# Patient Record
Sex: Male | Born: 1960 | Race: Black or African American | Hispanic: No | Marital: Married | Smoking: Never smoker
Health system: Southern US, Community
[De-identification: ages and names within clinical notes are randomized; demographics above are authoritative.]

## PROBLEM LIST (undated history)

## (undated) DIAGNOSIS — L0291 Cutaneous abscess, unspecified: Secondary | ICD-10-CM

## (undated) DIAGNOSIS — I050 Rheumatic mitral stenosis: Secondary | ICD-10-CM

## (undated) DIAGNOSIS — M1990 Unspecified osteoarthritis, unspecified site: Secondary | ICD-10-CM

## (undated) DIAGNOSIS — Z992 Dependence on renal dialysis: Secondary | ICD-10-CM

## (undated) DIAGNOSIS — E039 Hypothyroidism, unspecified: Secondary | ICD-10-CM

## (undated) DIAGNOSIS — I272 Pulmonary hypertension, unspecified: Secondary | ICD-10-CM

## (undated) DIAGNOSIS — I428 Other cardiomyopathies: Secondary | ICD-10-CM

## (undated) DIAGNOSIS — J189 Pneumonia, unspecified organism: Secondary | ICD-10-CM

## (undated) DIAGNOSIS — IMO0002 Reserved for concepts with insufficient information to code with codable children: Secondary | ICD-10-CM

## (undated) DIAGNOSIS — C73 Malignant neoplasm of thyroid gland: Secondary | ICD-10-CM

## (undated) DIAGNOSIS — N289 Disorder of kidney and ureter, unspecified: Secondary | ICD-10-CM

## (undated) DIAGNOSIS — Z95810 Presence of automatic (implantable) cardiac defibrillator: Secondary | ICD-10-CM

## (undated) DIAGNOSIS — Z9289 Personal history of other medical treatment: Secondary | ICD-10-CM

## (undated) DIAGNOSIS — I4891 Unspecified atrial fibrillation: Secondary | ICD-10-CM

## (undated) DIAGNOSIS — D649 Anemia, unspecified: Secondary | ICD-10-CM

## (undated) DIAGNOSIS — T8189XA Other complications of procedures, not elsewhere classified, initial encounter: Secondary | ICD-10-CM

## (undated) DIAGNOSIS — N186 End stage renal disease: Secondary | ICD-10-CM

## (undated) DIAGNOSIS — I350 Nonrheumatic aortic (valve) stenosis: Secondary | ICD-10-CM

## (undated) HISTORY — DX: Hypothyroidism, unspecified: E03.9

## (undated) HISTORY — DX: Unspecified atrial fibrillation: I48.91

## (undated) HISTORY — PX: UMBILICAL HERNIA REPAIR: SHX196

## (undated) HISTORY — DX: Malignant neoplasm of thyroid gland: C73

## (undated) HISTORY — DX: Other cardiomyopathies: I42.8

## (undated) HISTORY — PX: OTHER SURGICAL HISTORY: SHX169

## (undated) HISTORY — DX: Reserved for concepts with insufficient information to code with codable children: IMO0002

## (undated) HISTORY — PX: HERNIA REPAIR: SHX51

## (undated) HISTORY — DX: Pulmonary hypertension, unspecified: I27.20

## (undated) HISTORY — PX: TOE AMPUTATION: SHX809

## (undated) HISTORY — PX: KNEE SURGERY: SHX244

---

## 2001-05-12 HISTORY — PX: DIALYSIS FISTULA CREATION: SHX611

## 2004-05-12 HISTORY — PX: LAPAROSCOPIC GASTRIC BANDING: SHX1100

## 2007-05-13 DIAGNOSIS — Z9289 Personal history of other medical treatment: Secondary | ICD-10-CM

## 2007-05-13 HISTORY — DX: Personal history of other medical treatment: Z92.89

## 2007-05-13 HISTORY — PX: PROSTATE BIOPSY: SHX241

## 2007-05-13 HISTORY — PX: KIDNEY TRANSPLANT: SHX239

## 2007-05-13 HISTORY — PX: CARDIAC DEFIBRILLATOR PLACEMENT: SHX171

## 2009-05-12 DIAGNOSIS — C73 Malignant neoplasm of thyroid gland: Secondary | ICD-10-CM

## 2009-05-12 HISTORY — PX: THYROIDECTOMY: SHX17

## 2009-05-12 HISTORY — DX: Malignant neoplasm of thyroid gland: C73

## 2014-04-17 LAB — PROTIME-INR: INR: 1.9 — AB (ref 0.9–1.1)

## 2014-04-24 LAB — PROTIME-INR: INR: 2.8 — AB (ref 0.9–1.1)

## 2014-05-01 LAB — PROTIME-INR: INR: 2 — AB (ref 0.9–1.1)

## 2014-05-22 LAB — PROTIME-INR: INR: 1.5 — AB (ref 0.9–1.1)

## 2014-05-23 ENCOUNTER — Ambulatory Visit (INDEPENDENT_AMBULATORY_CARE_PROVIDER_SITE_OTHER): Payer: Medicare Other | Admitting: Cardiology

## 2014-05-23 ENCOUNTER — Encounter: Payer: Self-pay | Admitting: Cardiology

## 2014-05-23 ENCOUNTER — Telehealth: Payer: Self-pay | Admitting: Cardiology

## 2014-05-23 VITALS — BP 82/60 | HR 65 | Ht 73.0 in | Wt 271.0 lb

## 2014-05-23 DIAGNOSIS — I428 Other cardiomyopathies: Secondary | ICD-10-CM

## 2014-05-23 DIAGNOSIS — I429 Cardiomyopathy, unspecified: Secondary | ICD-10-CM

## 2014-05-23 DIAGNOSIS — Z95810 Presence of automatic (implantable) cardiac defibrillator: Secondary | ICD-10-CM

## 2014-05-23 DIAGNOSIS — I480 Paroxysmal atrial fibrillation: Secondary | ICD-10-CM

## 2014-05-23 NOTE — Telephone Encounter (Signed)
Received signed Release from patient to obtain his cardiology records from [facility].  Faxed on 05/23/14. Records requested per  Dr Percival Spanish.

## 2014-05-23 NOTE — Patient Instructions (Signed)
Your physician has recommended making the following medication changes: DECREASE Amiodarone to 200 mg (1 tablet) once daily.  Your INR today was 1.5. Dr Percival Spanish recommends that you take 1.5 tablets (9 mg total) for the next THREE days and then resume 1 tablet daily.  Please schedule and appointment in 2 weeks with Nehemiah Massed, PharmD.  Dr Percival Spanish recommends scheduling an appointment in the ICD clinic. Dr Percival Spanish wants you to follow-up in 2 months. You will receive a reminder letter in the mail one months in advance. If you don't receive a letter, please call our office to schedule the follow-up appointment.

## 2014-05-23 NOTE — Progress Notes (Addendum)
HPI The patient presents as a new patient. She is moving here from [place].I don't have any of the old records.  However, he describes a nonischemic cardiomyopathy. He has a defibrillator. He has end-stage renal disease secondary to uncontrolled hypertension. 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 He is on dialysis and actually tolerates this well. Despite hypotension he says he does not have to stop dialysis. He is active exercising on a treadmill and a bike routinely. With this he denies any cardiovascular symptoms. He chronically sleeps on 2 pillows. He doesn't have shortness of breath, PND or orthopnea. He has no weight gain or edema. He has no palpitations, presyncope or syncope.  Allergies  Allergen Reactions  . Vasotec [Enalapril] Swelling    Current Outpatient Prescriptions  Medication Sig Dispense Refill  . ambrisentan (LETAIRIS) 10 MG tablet Take 10 mg by mouth daily.    Marland Kitchen amiodarone (PACERONE) 200 MG tablet Take 200 mg by mouth 2 (two) times daily.    . cinacalcet (SENSIPAR) 90 MG tablet Take 90 mg by mouth daily.    Marland Kitchen levothyroxine (SYNTHROID, LEVOTHROID) 112 MCG tablet Take 112 mcg by mouth 2 (two) times daily.    . midodrine (PROAMATINE) 10 MG tablet Take 10 mg by mouth 3 (three) times daily.    . Tadalafil, PAH, (ADCIRCA) 20 MG TABS Take 20 mg by mouth 2 (two) times daily.    Marland Kitchen warfarin (COUMADIN) 6 MG tablet Take 6 mg by mouth daily.     No current facility-administered medications for this visit.    Past Medical History  Diagnosis Date  . Atrial fibrillation   . Nonischemic cardiomyopathy   . ICD (implantable cardioverter-defibrillator) in place   . Renal failure     Dialysis  . HTN (hypertension)   . Thyroid cancer   . Pulmonary HTN     Past Surgical History  Procedure Laterality Date  . Thyroidectomy    . Dialysis fistula creation    . Kidney transplant    . Laparoscopic gastric banding    . Toe  amputations      Family History  Problem Relation Age of Onset  . Hypertension Father   . Heart failure Mother   . Kidney failure Father   . Hypertension Mother   . CAD Mother 46    History   Social History  . Marital Status: Divorced    Spouse Name: N/A    Number of Children: 1  . Years of Education: N/A   Occupational History  . Not on file.   Social History Main Topics  . Smoking status: Never Smoker   . Smokeless tobacco: Not on file  . Alcohol Use: No  . Drug Use: No  . Sexual Activity: Not on file   Other Topics Concern  . Not on file   Social History Narrative   Retired Archivist.      ROS:  As stated in the HPI and negative for all other systems.  PHYSICAL EXAM BP 82/60 mmHg  Pulse 65  Ht 6\' 1"  (1.854 m)  Wt 271 lb (122.925 kg)  BMI 35.76 kg/m2  GENERAL:  Well appearing HEENT:  Pupils equal round and reactive, fundi not visualized, oral mucosa unremarkable NECK:  No jugular venous distention, waveform within normal limits, carotid upstroke brisk and symmetric, no bruits, no thyromegaly LYMPHATICS:  No cervical, inguinal adenopathy LUNGS:  Clear to auscultation bilaterally BACK:  No CVA tenderness CHEST:  ICD pocket  HEART:  PMI not displaced or sustained,S1 and S2 within normal limits, no S3, no S4, no clicks, no rubs, 2/6 Aholosystolic murmur her in particular it is left upper and right upper sternal borders, no diastolic murmurs ABD:  Flat, positive bowel sounds normal in frequency in pitch, no bruits, no rebound, no guarding, no midline pulsatile mass, no hepatomegaly, no splenomegaly EXT:  2 plus pulses throughout, no edema, no cyanosis no clubbing SKIN:  No rashes no nodules NEURO:  Cranial nerves II through XII grossly intact, motor grossly intact throughout PSYCH:  Cognitively intact, oriented to person place and time   EKG:  Sinus rhythm, rate 65, low voltage in limb and chest leads, leftward axis, possible old anteroseptal  MI, diffuse nonspecific ST-T wave flattening. No old EKGs for comparison. 05/23/2014   ASSESSMENT AND PLAN  CARDIOMYOPATHY:    He seems to be euvolemic. I would like to get his records from [place] and in particular his most recent echocardiogram.  ATRIAL FIB:  He will reduce his amiodarone to 200 mg daily. We did check his INR was slightly low and he was given instruction on increasing his Coumadin dose. When I see him in followup if he hasn't had his routine amiodarone labs in order these.  ICD:    We will schedule him for followup in our EP clinic.  HYPOTENSION:  He says this is baseline. He seems to tolerate this. No change in therapy is indicated. This means we cannot further titrate his meds.  PULMONARY HTN:  I will again with this the old records discussing this diagnosis. He does have followup of pulmonary. We will continue on the meds as listed.  05/26/14  I was able to review outside records.  EF is actually 55% by last echo.  He apparently did have a previous cardiomyopathy.  There is mention of an ablation in the past but I don't see details.  I don't see any recent or past ischemia work up.

## 2014-05-24 ENCOUNTER — Ambulatory Visit (INDEPENDENT_AMBULATORY_CARE_PROVIDER_SITE_OTHER): Payer: Medicare Other | Admitting: *Deleted

## 2014-05-24 DIAGNOSIS — Z7901 Long term (current) use of anticoagulants: Secondary | ICD-10-CM

## 2014-05-24 DIAGNOSIS — I480 Paroxysmal atrial fibrillation: Secondary | ICD-10-CM

## 2014-05-24 DIAGNOSIS — I4891 Unspecified atrial fibrillation: Secondary | ICD-10-CM | POA: Insufficient documentation

## 2014-05-24 LAB — POCT INR: INR: 1.5

## 2014-05-24 NOTE — Telephone Encounter (Signed)
Received records from [facility] that Dr Percival Spanish had requested.  Patient has appointment on 07/27/14.  Records given to Dr Percival Spanish for review.

## 2014-05-25 NOTE — Addendum Note (Signed)
Addended by: Chauncy Lean. on: 05/25/2014 09:44 AM   Modules accepted: Orders

## 2014-05-29 LAB — PROTIME-INR: INR: 1.7 — AB (ref 0.9–1.1)

## 2014-06-01 ENCOUNTER — Ambulatory Visit (INDEPENDENT_AMBULATORY_CARE_PROVIDER_SITE_OTHER): Payer: Medicare Other | Admitting: Pharmacist Clinician (PhC)/ Clinical Pharmacy Specialist

## 2014-06-01 ENCOUNTER — Ambulatory Visit (INDEPENDENT_AMBULATORY_CARE_PROVIDER_SITE_OTHER): Payer: Medicare Other | Admitting: Pulmonary Disease

## 2014-06-01 ENCOUNTER — Encounter: Payer: Self-pay | Admitting: Pulmonary Disease

## 2014-06-01 ENCOUNTER — Telehealth: Payer: Self-pay

## 2014-06-01 VITALS — BP 106/58 | HR 78 | Ht 72.0 in | Wt 269.0 lb

## 2014-06-01 DIAGNOSIS — IMO0002 Reserved for concepts with insufficient information to code with codable children: Secondary | ICD-10-CM | POA: Insufficient documentation

## 2014-06-01 DIAGNOSIS — I480 Paroxysmal atrial fibrillation: Secondary | ICD-10-CM

## 2014-06-01 DIAGNOSIS — I272 Pulmonary hypertension, unspecified: Secondary | ICD-10-CM | POA: Insufficient documentation

## 2014-06-01 DIAGNOSIS — I270 Primary pulmonary hypertension: Secondary | ICD-10-CM

## 2014-06-01 DIAGNOSIS — Z7901 Long term (current) use of anticoagulants: Secondary | ICD-10-CM

## 2014-06-01 NOTE — Progress Notes (Signed)
Subjective:    Patient ID: Adam [name], male    DOB: 1960-11-02, 54 y.o.   MRN: [id number]  HPI  Chief Complaint  Patient presents with  . Advice Only    Pt referred for pulmonary hypertension.  Dx'ed in 2012.  Has been on [place] since 2012.    This is a very pleasant 54 year old male with pulmonary hypertension his been referred to my clinic for evaluation and management of the same. He tells me that he developed hypertensive nephropathy in the mid 2000s and underwent a n kidney transplant in 2009. However, unfortunately he developed rejection in 2010 for an and reasons. He said that he had been compliant with medications and there is no clear evidence of an infection. He says that after an extensive workup his physicians determined that he had developed pulmonary hypertension and that this contributed to the rejection.  He says that he underwent a right heart catheterization as well as an echocardiogram and lab workup at that time. He also had pulmonary lungs and testing. He was told that he did not have a lung problem but that he had idiopathic pulmonary hypertension. He was started on with Letairis as well as Adcirca at the time. Prior to starting on pulmonary vasodilators his symptoms included leg swelling and shortness of breath. He says shortly after starting the medications however the symptoms went away and he has remained a [facility] since then. He has been tolerant of hemodialysis, though he notes that he sometimes has hypotension and he often takes midodrine for this.  Since moving to [place] he has been stable. His dose of the pulmonary vasodilators has not changed. He is here today to establish care.  Past Medical History  Diagnosis Date  . Atrial fibrillation   . Nonischemic cardiomyopathy   . ICD (implantable cardioverter-defibrillator) in place     [employer]   . Renal failure     Dialysis  . HTN (hypertension)   . Thyroid cancer   . Pulmonary HTN        Family History  Problem Relation Age of Onset  . Hypertension Father   . Heart failure Mother   . Kidney failure Father   . Hypertension Mother   . CAD Mother 79  . Emphysema Mother      History   Social History  . Marital Status: Divorced    Spouse Name: N/A    Number of Children: 1  . Years of Education: N/A   Occupational History  . Not on file.   Social History Main Topics  . Smoking status: Never Smoker   . Smokeless tobacco: Never Used  . Alcohol Use: No  . Drug Use: No  . Sexual Activity: Not on file   Other Topics Concern  . Not on file   Social History Narrative   Retired Archivist.       Allergies  Allergen Reactions  . Ivp Dye [Iodinated Diagnostic Agents] Nausea And Vomiting  . Vasotec [Enalapril] Swelling     Outpatient Prescriptions Prior to Visit  Medication Sig Dispense Refill  . ambrisentan (LETAIRIS) 10 MG tablet Take 10 mg by mouth daily.    Marland Kitchen amiodarone (PACERONE) 200 MG tablet Take 200 mg by mouth daily.    . cinacalcet (SENSIPAR) 90 MG tablet Take 90 mg by mouth daily.    Marland Kitchen levothyroxine (SYNTHROID, LEVOTHROID) 112 MCG tablet Take 112 mcg by mouth 2 (two) times daily.    . midodrine (  PROAMATINE) 10 MG tablet Take 10 mg by mouth 3 (three) times daily.    . Tadalafil, PAH, (ADCIRCA) 20 MG TABS Take 20 mg by mouth 2 (two) times daily.    Marland Kitchen warfarin (COUMADIN) 6 MG tablet Take 6 mg by mouth daily.     No facility-administered medications prior to visit.        Review of Systems  Constitutional: Negative for fever and unexpected weight change.  HENT: Negative for congestion, dental problem, ear pain, nosebleeds, postnasal drip, rhinorrhea, sinus pressure, sneezing, sore throat and trouble swallowing.   Eyes: Negative for redness and itching.  Respiratory: Negative for cough, chest tightness, shortness of breath and wheezing.   Cardiovascular: Negative for palpitations and leg swelling.  Gastrointestinal: Negative  for nausea and vomiting.  Genitourinary: Negative for dysuria.  Musculoskeletal: Negative for joint swelling.  Skin: Negative for rash.  Neurological: Negative for headaches.  Hematological: Does not bruise/bleed easily.  Psychiatric/Behavioral: Negative for dysphoric mood. The patient is not nervous/anxious.        Objective:   Physical Exam Filed Vitals:   06/01/14 1408  BP: 106/58  Pulse: 78  Height: 6' (1.829 m)  Weight: 269 lb (122.018 kg)  SpO2: 98%  RA   Gen: well appearing, no acute distress HEENT: NCAT, PERRL, EOMi, OP clear, neck supple without masses PULM: CTA B CV: RRR, systolic murmur, no JVD; R AVF AB: BS+, soft, nontender, no hsm Ext: warm, no edema, no clubbing, no cyanosis Derm: no rash or skin breakdown Neuro: A&Ox4, CN II-XII intact, strength 5/5 in all 4 extremities  Clinic notes from his nephrologist and cardiology visit recently were reviewed today in clinic     Assessment & Plan:   Pulmonary hypertension Based on his history it appears that he has idiopathic pulmonary hypertension that was diagnosed in 2012 a right heart catheterization while living in [place]. He never take diet pills, he does not have a history of HIV, he does not have a history of sarcoidosis or of a connective tissue disease. Sometimes pulmonary hypertension can be associated with end-stage renal disease but typically this is in the setting of an AV fistula. He does have a history of thyroid carcinoma and had a thyroidectomy (hypothyroidism has been associated with pulmonary hypertension) but he has always been on thyroid replacement therapy.  He has done quite well with Letairis as well as Acirca over the years and is asymptomatic.    He does have some mild hypotension with dialysis for which she takes midodrin. I explained to him that an alternative option may be to use half dose of Adcirca on the days of dialysis rather than the midodrin. He will consider this with his  nephrologist.  Plan: -Obtain baseline 6 minute walk -Continue Letairis and Engineer, site -Obtain records from prior pulmonologist and cardiologist for right heart cath, echo, and clinic notes and lab workup at the time of pulmonary hypertension diagnosis.    Updated Medication List Outpatient Encounter Prescriptions as of 2020-04-2215  Medication Sig  . ambrisentan (LETAIRIS) 10 MG tablet Take 10 mg by mouth daily.  Marland Kitchen amiodarone (PACERONE) 200 MG tablet Take 200 mg by mouth daily.  . cinacalcet (SENSIPAR) 90 MG tablet Take 90 mg by mouth daily.  Marland Kitchen levothyroxine (SYNTHROID, LEVOTHROID) 112 MCG tablet Take 112 mcg by mouth 2 (two) times daily.  . midodrine (PROAMATINE) 10 MG tablet Take 10 mg by mouth 3 (three) times daily.  . Tadalafil, PAH, (ADCIRCA) 20 MG TABS Take 20  mg by mouth 2 (two) times daily.  Marland Kitchen warfarin (COUMADIN) 6 MG tablet Take 6 mg by mouth daily.

## 2014-06-01 NOTE — Assessment & Plan Note (Signed)
Based on his history it appears that he has idiopathic pulmonary hypertension that was diagnosed in 2012 a right heart catheterization while living in [place]. He never take diet pills, he does not have a history of HIV, he does not have a history of sarcoidosis or of a connective tissue disease. Sometimes pulmonary hypertension can be associated with end-stage renal disease but typically this is in the setting of an AV fistula. He does have a history of thyroid carcinoma and had a thyroidectomy (hypothyroidism has been associated with pulmonary hypertension) but he has always been on thyroid replacement therapy.  He has done quite well with Letairis as well as Acirca over the years and is asymptomatic.    He does have some mild hypotension with dialysis for which she takes midodrin. I explained to him that an alternative option may be to use half dose of Adcirca on the days of dialysis rather than the midodrin. He will consider this with his nephrologist.  Plan: -Obtain baseline 6 minute walk -Continue Letairis and Engineer, site -Obtain records from prior pulmonologist and cardiologist for right heart cath, echo, and clinic notes and lab workup at the time of pulmonary hypertension diagnosis.

## 2014-06-01 NOTE — Telephone Encounter (Signed)
lmtcb X1 for pt, I am requesting old records from pt's old pulmonologist and was told that pt saw Dr. Murvin Natal. There are at least 3 Dr. Rachell Cipro in [place], I need to know which one he saw.

## 2014-06-01 NOTE — Patient Instructions (Signed)
We will see you back in 4 months We will schedule a 6 minute walk between now and then We will request records from your cardiologist and pulmonoary

## 2014-06-05 LAB — PROTIME-INR

## 2014-06-06 ENCOUNTER — Ambulatory Visit (INDEPENDENT_AMBULATORY_CARE_PROVIDER_SITE_OTHER): Payer: Medicare Other | Admitting: *Deleted

## 2014-06-06 ENCOUNTER — Ambulatory Visit: Payer: Medicare Other

## 2014-06-06 ENCOUNTER — Other Ambulatory Visit: Payer: Self-pay | Admitting: *Deleted

## 2014-06-06 DIAGNOSIS — Z7901 Long term (current) use of anticoagulants: Secondary | ICD-10-CM

## 2014-06-06 DIAGNOSIS — I480 Paroxysmal atrial fibrillation: Secondary | ICD-10-CM

## 2014-06-06 LAB — POCT INR: INR: 1.5

## 2014-06-06 MED ORDER — WARFARIN SODIUM 6 MG PO TABS: 6.0000 mg | ORAL_TABLET | Freq: Every day | ORAL | Status: DC

## 2014-06-08 ENCOUNTER — Ambulatory Visit (INDEPENDENT_AMBULATORY_CARE_PROVIDER_SITE_OTHER): Payer: Medicare Other | Admitting: Pulmonary Disease

## 2014-06-08 DIAGNOSIS — I270 Primary pulmonary hypertension: Secondary | ICD-10-CM

## 2014-06-08 DIAGNOSIS — I272 Pulmonary hypertension, unspecified: Secondary | ICD-10-CM

## 2014-06-08 NOTE — Telephone Encounter (Signed)
Dr. Wilhemena Durie per pt.

## 2014-06-26 ENCOUNTER — Ambulatory Visit (INDEPENDENT_AMBULATORY_CARE_PROVIDER_SITE_OTHER): Payer: Medicare Other | Admitting: Pharmacist Clinician (PhC)/ Clinical Pharmacy Specialist

## 2014-06-26 DIAGNOSIS — I480 Paroxysmal atrial fibrillation: Secondary | ICD-10-CM

## 2014-06-26 DIAGNOSIS — Z7901 Long term (current) use of anticoagulants: Secondary | ICD-10-CM

## 2014-06-26 LAB — POCT INR: INR: 2.6

## 2014-06-26 LAB — PROTIME-INR: INR: 2.6 — AB (ref 0.9–1.1)

## 2014-06-30 NOTE — Telephone Encounter (Signed)
This has been faxed.  Nothing further needed. 

## 2014-07-03 LAB — PROTIME-INR
INR: 2.9 — AB (ref 0.9–1.1)
INR: 2.9 — AB (ref <=1.1)

## 2014-07-05 ENCOUNTER — Ambulatory Visit (INDEPENDENT_AMBULATORY_CARE_PROVIDER_SITE_OTHER): Payer: Medicare Other | Admitting: Pharmacist Clinician (PhC)/ Clinical Pharmacy Specialist

## 2014-07-05 DIAGNOSIS — I480 Paroxysmal atrial fibrillation: Secondary | ICD-10-CM

## 2014-07-05 DIAGNOSIS — Z7901 Long term (current) use of anticoagulants: Secondary | ICD-10-CM

## 2014-07-17 LAB — PROTIME-INR: INR: 3.5 — AB (ref <=1.1)

## 2014-07-18 ENCOUNTER — Ambulatory Visit (INDEPENDENT_AMBULATORY_CARE_PROVIDER_SITE_OTHER): Payer: Medicare Other

## 2014-07-18 ENCOUNTER — Ambulatory Visit (INDEPENDENT_AMBULATORY_CARE_PROVIDER_SITE_OTHER): Payer: Medicare Other | Admitting: Pharmacist Clinician (PhC)/ Clinical Pharmacy Specialist

## 2014-07-18 DIAGNOSIS — G629 Polyneuropathy, unspecified: Secondary | ICD-10-CM | POA: Diagnosis not present

## 2014-07-18 DIAGNOSIS — R52 Pain, unspecified: Secondary | ICD-10-CM | POA: Diagnosis not present

## 2014-07-18 DIAGNOSIS — L89891 Pressure ulcer of other site, stage 1: Secondary | ICD-10-CM | POA: Diagnosis not present

## 2014-07-18 DIAGNOSIS — L97524 Non-pressure chronic ulcer of other part of left foot with necrosis of bone: Secondary | ICD-10-CM

## 2014-07-18 DIAGNOSIS — M86672 Other chronic osteomyelitis, left ankle and foot: Secondary | ICD-10-CM | POA: Diagnosis not present

## 2014-07-18 DIAGNOSIS — I739 Peripheral vascular disease, unspecified: Secondary | ICD-10-CM | POA: Diagnosis not present

## 2014-07-18 DIAGNOSIS — I480 Paroxysmal atrial fibrillation: Secondary | ICD-10-CM

## 2014-07-18 DIAGNOSIS — Z7901 Long term (current) use of anticoagulants: Secondary | ICD-10-CM

## 2014-07-18 MED ORDER — SULFAMETHOXAZOLE-TRIMETHOPRIM 800-160 MG PO TABS: 1.0000 | ORAL_TABLET | Freq: Two times a day (BID) | ORAL | Status: DC

## 2014-07-18 MED ORDER — CIPROFLOXACIN HCL 500 MG PO TABS: 500.0000 mg | ORAL_TABLET | Freq: Two times a day (BID) | ORAL | Status: DC

## 2014-07-18 NOTE — Patient Instructions (Addendum)
Pre-Operative Instructions  Congratulations, you have decided to take an important step to improving your quality of life.  You can be assured that the doctors of [facility] will be with you every step of the way.  1. Plan to be at the surgery center/hospital at least 1 (one) hour prior to your scheduled time unless otherwise directed by the surgical center/hospital staff.  You must have a responsible adult accompany you, remain during the surgery and drive you home.  Make sure you have directions to the surgical center/hospital and know how to get there on time. 2. For hospital based surgery you will need to obtain a history and physical form from your family physician within 1 month prior to the date of surgery- we will give you a form for you primary physician.  3. We make every effort to accommodate the date you request for surgery.  There are however, times where surgery dates or times have to be moved.  We will contact you as soon as possible if a change in schedule is required.   4. No Aspirin/Ibuprofen for one week before surgery.  If you are on aspirin, any non-steroidal anti-inflammatory medications (Mobic, Aleve, Ibuprofen) you should stop taking it 7 days prior to your surgery.  You make take Tylenol  For pain prior to surgery.  5. Medications- If you are taking daily heart and blood pressure medications, seizure, reflux, allergy, asthma, anxiety, pain or diabetes medications, make sure the surgery center/hospital is aware before the day of surgery so they may notify you which medications to take or avoid the day of surgery. 6. No food or drink after midnight the night before surgery unless directed otherwise by surgical center/hospital staff. 7. No alcoholic beverages 24 hours prior to surgery.  No smoking 24 hours prior to or 24 hours after surgery. 8. Wear loose pants or shorts- loose enough to fit over bandages, boots, and casts. 9. No slip on shoes, sneakers are best. 10. Bring  your boot with you to the surgery center/hospital.  Also bring crutches or a walker if your physician has prescribed it for you.  If you do not have this equipment, it will be provided for you after surgery. 11. If you have not been contracted by the surgery center/hospital by the day before your surgery, call to confirm the date and time of your surgery. 12. Leave-time from work may vary depending on the type of surgery you have.  Appropriate arrangements should be made prior to surgery with your employer. 13. Prescriptions will be provided immediately following surgery by your doctor.  Have these filled as soon as possible after surgery and take the medication as directed. 14. Remove nail polish on the operative foot. 15. Wash the night before surgery.  The night before surgery wash the foot and leg well with the antibacterial soap provided and water paying special attention to beneath the toenails and in between the toes.  Rinse thoroughly with water and dry well with a towel.  Perform this wash unless told not to do so by your physician.  Enclosed: 1 Ice pack (please put in freezer the night before surgery)   1 Hibiclens skin cleaner   Pre-op Instructions  If you have any questions regarding the instructions, do not hesitate to call our office.  [place]: [address] [place], [place] [address] [phone]  [place]: [address] [place], [place] [address] [phone]  [place]: [address]  [place], [place] [address] [phone]  Dr. Tannya Gonet   Tuchman DPM, Dr. Ila Mcgill DPM Dr. Harriet Masson DPM, Dr. Lanelle Bal DPM, Dr. Trudie Buckler DPM     Betadine Soak Instructions  Purchase an 8 oz. bottle of BETADINE solution (Povidone)  THE DAY AFTER THE PROCEDURE  Place 1 tablespoon of betadine solution in a quart of warm tap water.  Submerge your foot or feet with outer bandage intact for the initial soak; this will allow the bandage to become moist and wet for easy lift off.   Once you remove your bandage, continue to soak in the solution for 20 minutes.  This soak should be done twice a day.  Next, remove your foot or feet from solution, blot dry the affected area and cover.  You may use a band aid large enough to cover the area or use gauze and tape. After washing her soaking in Betadine dry the area thoroughly and apply dry sterile gauze dressing daily as instructed IF YOUR SKIN BECOMES IRRITATED WHILE USING THESE INSTRUCTIONS, IT IS OKAY TO SWITCH TO EPSOM SALTS AND WATER OR WHITE VINEGAR AND WATER.

## 2014-07-18 NOTE — Progress Notes (Signed)
Subjective:    Patient ID: Adam [name], male    DOB: 1960/05/13, 54 y.o.   MRN: [id number]  HPI  PT STATED LT FOOT GREAT TOE IS SORE AND DRAINING FOR 2 MONTHS. THE TOE IS GETTING WORSE AND BLEEDING. THE TOE GET AGGRAVATED BY PRESSURE. TRIED NO TREATMENT.  Review of Systems  All other systems reviewed and are negative.      Objective:   Physical Exam 54 year old F Adam [name] presents this time referral from Dr. Gershon Mussel. Patient indicates he has is sore and infected toe with bone damage affecting his left great toes been going on for more than 2 months. Has been cleaning it with Betadine and applying dressings. Recently moved here from [place] is on dialysis and does have a cardiologist but does not room or the doctors names at the current time. Objective findings as follows vascular status is diminished with thready to nonpalpable pedal pulses DP and PT 0 over 4 bilateral can not really palpate an adequate pulse temperature is warm turgor diminished no edema rubor or varicosities noted. Patient's had amputation of hallux and second toes on right foot and irritation second toe left foot currently has an ulceration of the nailbed and distal tuft of the left hallux with x-rays that demonstrate lysis of the distal phalanx. There is malodor no severe drainage although dressings are being changed likely is serosanguineous drainage noted. No ascending cellulitis or lymphangitis patient is afebrile patient denies any history of diabetes alertness family history of diabetes noted. Patient is on dialysis due to renal failure secondary to hypertension. Dermatologically skin color pigment normal hair growth absent there is decreased texture and turgor the skin with ulceration distal tuft and nailbed of the left hallux. There is no dictated hallux and second toe left foot with still some possible opening of the second toe amputation site present drainage is noted there is malodor of the left hallux noted. X-rays  confirm lysis of the distal phalanx intact IP joint and proximal phalanx noted amputated distal and middle phalanx of the second digit on the left foot are also identified. X-rays taken just last week. Orthopedic biomechanical exam reveals rectus foot type with significant promontory changes and amputation secondary to likely angiopathy and neuropathy. Patient Myriam Jacobson is feeling on Thornell Mule is intact sensation to most areas of the foot decreased sensation to the plantar foot however is completely insensate on debridement of the ulcer site and nailbed of the left great toe absence of sharp and deep sensation of pain is significant at this time patient has profound neuropathy.       Assessment & Plan:  Assessment this time is ulceration of left great toe nail and nailbed with chronic osteomyelitis and lysis of distal phalanx left hallux. Patient likely has peripheral neuropathy and angiopathy. Currently no distress no fever no chills no ascending cellulitis is identified.. Concern about hemorrhage a keratotic tissue is debrided from the distal tuft of the hallux and nail plate bed deep culture swabs are obtained for culture patient is placed empirically on antibiotics Cipro 500 twice a day also Septra DS twice a day. Consent form for amputation of left great toe is reviewed and signed all questions asked by the patient are answered try to schedule patient sometime within the next week to be done at the [facility] specialty surgical center under IV sedation and local block. In the interim take Anaprox as instructed wash with Betadine and water or Epsom salts and water apply daily sterile  dressing changes. Contact us immediately if there is any changes or exacerbations at this time the areas dressed with Iodosorb and gauze dressing following the thorough debridement of xerotic necrotic tissue is removed again cleansing with soap and water or Betadine water and dressing daily as instructed surgical amputation  scheduled within a week    Harriet Masson DPM

## 2014-07-19 ENCOUNTER — Encounter: Payer: Self-pay | Admitting: Cardiology

## 2014-07-20 ENCOUNTER — Encounter: Payer: Self-pay | Admitting: Pharmacist Clinician (PhC)/ Clinical Pharmacy Specialist

## 2014-07-20 DIAGNOSIS — I480 Paroxysmal atrial fibrillation: Secondary | ICD-10-CM

## 2014-07-20 DIAGNOSIS — Z7901 Long term (current) use of anticoagulants: Secondary | ICD-10-CM

## 2014-07-20 NOTE — Progress Notes (Signed)
This encounter was created in error - please disregard.

## 2014-07-24 ENCOUNTER — Telehealth: Payer: Self-pay | Admitting: *Deleted

## 2014-07-24 LAB — PROTIME-INR: INR: 3.9 — AB (ref 0.9–1.1)

## 2014-07-24 NOTE — Telephone Encounter (Signed)
I returned his call.  "I need to reschedule my surgery.  I have a family funeral I have to arrange for."  When would you like to reschedule?  "I'd like to do it on the next available date."  He can do it 08/02/2014.  "What day is that?"  That is Wednesday of next week.  "That will work."

## 2014-07-24 NOTE — Telephone Encounter (Signed)
-----   Message from Roney Jaffe, RN sent at 07/24/2014 12:42 PM EDT ----- This pt needs to reschedule his surgery

## 2014-07-25 ENCOUNTER — Encounter: Payer: Self-pay | Admitting: Cardiovascular Disease

## 2014-07-25 ENCOUNTER — Ambulatory Visit (INDEPENDENT_AMBULATORY_CARE_PROVIDER_SITE_OTHER): Payer: Medicare Other | Admitting: *Deleted

## 2014-07-25 ENCOUNTER — Ambulatory Visit (INDEPENDENT_AMBULATORY_CARE_PROVIDER_SITE_OTHER): Payer: Medicare Other | Admitting: Cardiovascular Disease

## 2014-07-25 ENCOUNTER — Telehealth: Payer: Self-pay | Admitting: *Deleted

## 2014-07-25 VITALS — BP 79/53 | HR 72 | Resp 16 | Ht 73.0 in | Wt 271.6 lb

## 2014-07-25 DIAGNOSIS — Z95810 Presence of automatic (implantable) cardiac defibrillator: Secondary | ICD-10-CM | POA: Insufficient documentation

## 2014-07-25 DIAGNOSIS — I429 Cardiomyopathy, unspecified: Secondary | ICD-10-CM

## 2014-07-25 DIAGNOSIS — I480 Paroxysmal atrial fibrillation: Secondary | ICD-10-CM | POA: Diagnosis not present

## 2014-07-25 DIAGNOSIS — I428 Other cardiomyopathies: Secondary | ICD-10-CM

## 2014-07-25 DIAGNOSIS — I950 Idiopathic hypotension: Secondary | ICD-10-CM

## 2014-07-25 DIAGNOSIS — Z79899 Other long term (current) drug therapy: Secondary | ICD-10-CM

## 2014-07-25 DIAGNOSIS — I959 Hypotension, unspecified: Secondary | ICD-10-CM | POA: Insufficient documentation

## 2014-07-25 DIAGNOSIS — N186 End stage renal disease: Secondary | ICD-10-CM | POA: Insufficient documentation

## 2014-07-25 DIAGNOSIS — Z5181 Encounter for therapeutic drug level monitoring: Secondary | ICD-10-CM | POA: Diagnosis not present

## 2014-07-25 DIAGNOSIS — Z7901 Long term (current) use of anticoagulants: Secondary | ICD-10-CM

## 2014-07-25 LAB — POCT INR: INR: 3.6

## 2014-07-25 NOTE — Telephone Encounter (Signed)
I scheduled his first post-op appointment on March 29th at 3:15pm.

## 2014-07-25 NOTE — Patient Instructions (Addendum)
Your physician recommends that you return for lab work in: If you have not had recent labs through Dr. Etheleen Nicks office   Remote monitoring is used to monitor your ICD from home. This monitoring reduces the number of office visits required to check your device to one time per year. It allows [place] to keep an eye on the functioning of your device to ensure it is working properly. You are scheduled for a device check from home on 10-26-2014. You may send your transmission at any time that day. If you have a wireless device, the transmission will be sent automatically. After your physician reviews your transmission, you will receive a postcard with your next transmission date.  Your physician recommends that you schedule a follow-up appointment in: 12 months with Dr.Croitoru

## 2014-07-25 NOTE — Telephone Encounter (Signed)
Caren Griffins requested a copy of patient's wound culture results.  Patient reschedule his surgery to 08/02/2014.  Results were faxed.

## 2014-07-25 NOTE — Progress Notes (Signed)
Patient ID: Adam [name], male   DOB: 1961-04-20, 55 y.o.   MRN: [id number]     Cardiology Office Note   Date:  07/25/2014   ID:  Adam [name], DOB [birth year], MRN [id number]  PCP:  No PCP Per Patient  Cardiologist:  Minus Breeding, MD  Sanda Klein, MD   Chief Complaint  Patient presents with  . Follow-up    2 month visit - Medtronic battery changed Feb 5th (Serial [id number], Model [id number]); no complaints      History of Present Illness: Adam [name] is a 54 y.o. male who presents for establishment of ICD follow-up after relocation from [place]. He bears a diagnosis of nonischemic cardiomyopathy and severe pulmonary hypertension. His most recent echo as well as older echo reports showed normal left ventricular systolic function. The most recent echo also describes normal right ventricular size and function, but older echo reports describe the right ventricle is dilated and moderately depressed. Most recent systolic PA pressure was measured at 43 mmHg and described as an improvement from previous studies.  His defibrillator is a Medtronic Evera XT dual-chamber device that is a generator change performed in February 2016. His initial device and leads were implanted in 2009 (atrial lead [employer] [id number] serial number [id number], defibrillator lead Medtronic [id number] Sprint Quattro secure serial number [id number]). She reports 2 previous defibrillator shocks that each occurred roughly 4 years ago on separate occasions. He did not have syncope with either event. He believes that the defibrillator discharges were "appropriate". One happened while he was riding in an elevator, the other happened while he was walking at a slow pace. No records regarding the actual treated arrhythmias are available at this time.  She also has a history of paroxysmal atrial fibrillation for which he takes amiodarone and warfarin. He is on combination vasodilator therapy for pulmonary artery hypertension, etiology uncertain. He is  now seeing Dr. Spero Curb for this . He has a history of thyroidectomy following thyroid cancer and is on chronic thyroid hormone replacement. 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 She is currently receiving hemodialysis via a left antecubital AV fistula. He has chronic hypotension for which she takes Midrin. Today he is quite hypotensive but is completely asymptomatic with a systolic blood pressure of 79 mmHg. A similar blood pressure was documented in January. When he saw Dr. Alvin Critchley running a couple of months ago his blood pressure was 106/58. His weight has not changed much over the last couple of months. He has a remote history of lap band for weight loss.  He has no complaints. Has not had recent problems with dizziness or syncope. He exercises on a regular basis on a treadmill and bicycle. He denies exertional dyspnea or chest pain. He does not have palpitations or lower extremity edema.    Past Medical History  Diagnosis Date  . Atrial fibrillation   . Nonischemic cardiomyopathy   . ICD (implantable cardioverter-defibrillator) in place     Medtronic   . Renal failure     Dialysis  . HTN (hypertension)   . Thyroid cancer   . Pulmonary HTN     Past Surgical History  Procedure Laterality Date  . Thyroidectomy    . Dialysis fistula creation    . Kidney transplant    . Laparoscopic gastric banding    . Toe amputations       Current Outpatient Prescriptions  Medication  Sig Dispense Refill  . ambrisentan (LETAIRIS) 10 MG tablet Take 10 mg by mouth daily.    Marland Kitchen amiodarone (PACERONE) 200 MG tablet Take 200 mg by mouth daily.    . cinacalcet (SENSIPAR) 90 MG tablet Take 90 mg by mouth daily.    . ciprofloxacin (CIPRO) 500 MG tablet Take 1 tablet (500 mg total) by mouth 2 (two) times daily. 20 tablet 1  . levothyroxine (SYNTHROID, LEVOTHROID) 112 MCG tablet Take  112 mcg by mouth 2 (two) times daily.    . midodrine (PROAMATINE) 10 MG tablet Take 10 mg by mouth 3 (three) times daily.    Marland Kitchen sulfamethoxazole-trimethoprim (BACTRIM DS) 800-160 MG per tablet Take 1 tablet by mouth 2 (two) times daily. 30 tablet 1  . Tadalafil, PAH, (ADCIRCA) 20 MG TABS Take 20 mg by mouth 2 (two) times daily.    Marland Kitchen warfarin (COUMADIN) 6 MG tablet Take 1 tablet (6 mg total) by mouth daily. Take as directed by the [facility]. 50 tablet 2  . calcium acetate (PHOSLO) 667 MG capsule Take 1 capsule by mouth 3 (three) times daily before meals.  6   No current facility-administered medications for this visit.    Allergies:   Ivp dye and Vasotec    Social History:  The patient  reports that he has never smoked. He has never used smokeless tobacco. He reports that he does not drink alcohol or use illicit drugs.   Family History:  The patient's family history includes CAD (age of onset: 54) in his mother; Emphysema in his mother; Heart failure in his mother; Hypertension in his father and mother; Kidney failure in his father.    ROS:  Please see the history of present illness.   Otherwise, review of systems are positive for none.   All other systems are reviewed and negative.    PHYSICAL EXAM: VS:  BP 79/53 mmHg  Pulse 72  Ht 6\' 1"  (1.854 m)  Wt 271 lb 9.6 oz (123.197 kg)  BMI 35.84 kg/m2 , BMI Body mass index is 35.84 kg/(m^2).  General: Alert, oriented x3, no distress Head: no evidence of trauma, PERRL, EOMI, no exophtalmos or lid lag, no myxedema, no xanthelasma; normal ears, nose and oropharynx Neck: normal jugular venous pulsations and no hepatojugular reflux; brisk carotid pulses without delay and no carotid bruits Chest: clear to auscultation, no signs of consolidation by percussion or palpation, normal fremitus, symmetrical and full respiratory excursions Cardiovascular: normal position and quality of the apical impulse, regular rhythm, normal first and second heart  sounds, 2/6 holosystolic murmur particularly loud along the left upper and lower sternal border, rubs or gallops Abdomen: no tenderness or distention, no masses by palpation, no abnormal pulsatility or arterial bruits, normal bowel sounds, no hepatosplenomegaly Extremities: no clubbing, cyanosis or edema; left antecubital AV fistula with excellent thrill and bruit,  2+ radial, ulnar and brachial pulses bilaterally; 2+ right femoral, posterior tibial and dorsalis pedis pulses; 2+ left femoral, posterior tibial and dorsalis pedis pulses; no subclavian or femoral bruits Neurological: grossly nonfocal    EKG:  EKG is ordered today. The ekg ordered today demonstrates sinus rhythm, generalized reduced low voltage that is quite impressive especially across the precordial leads, QS pattern in leads V1 and V2 and Q waves possibly in the inferior leads    Lipid Panel No results found for: CHOL, TRIG, HDL, CHOLHDL, VLDL, LDLCALC, LDLDIRECT    Wt Readings from Last 3 Encounters:  07/25/14 271 lb 9.6 oz (123.197 kg)  06/01/14 269 lb (122.018 kg)  05/23/14 271 lb (122.925 kg)      Other studies Reviewed: Additional studies/ records that were reviewed today include: Reports of 3 echocardiograms performed in the last 4 years in [place], last paceart report from [place]. Review of the above records demonstrates: See history of present illness   ASSESSMENT AND PLAN:  Normal function of his dual-chamber ICD without any recent episodes of ventricular or atrial arrhythmia. Note that the device was implanted as a generator change out in January and no records of previous arrhythmic events are available. Lead parameters are all in normal range. The tachycardia cardia detection settings were made somewhat more conservative, and lined with MADIT-RIT settings since he has not had any recent ventricular events and has never experienced syncope. Estimated generator longevity is 11 years. We'll have them  reassigned to our [facility] follow-up clinic.  The exact cause of his cardiomyopathy and need for defibrillator implantation is not very clear. Is possible that he had arrhythmia related to severe right heart failure that has reversed with treatment of pulmonary hypertension. The marked low voltage in his ECG raises the concern for possible cardiac amyloidosis. When I mention this diagnosis he states that this was considered when he was living in [place] but that was discounted following some type of biopsy procedure.  There have been no episodes of atrial fibrillation since his defibrillator change out earlier this year. He is on amiodarone and warfarin. His prothrombin time is slightly supratherapeutic today. This is likely related to treatment with Cipro. Will hold warfarin today. He is planning to interrupt the anticoagulant for foot surgery next week anyway. He is due to have amiodarone lab follow-up. Will order these today.  He is remarkably tolerant of fairly severe hypotension and no changes are made to his medications. Note is made of suggestions in Dr. Anastasia Pall recommendations that he might do well with a lower dose of adcirca on dialysis days.   Current medicines are reviewed at length with the patient today.  The patient does not have concerns regarding medicines.  The following changes have been made:  no change  Labs/ tests ordered today include:  Orders Placed This Encounter  Procedures  . Comprehensive metabolic panel  . TSH  . EKG 12-Lead    Patient Instructions  Your physician recommends that you return for lab work in: If you have not had recent labs through Dr. Etheleen Nicks office   Remote monitoring is used to monitor your ICD from home. This monitoring reduces the number of office visits required to check your device to one time per year. It allows [place] to keep an eye on the functioning of your device to ensure it is working properly. You are scheduled for a device  check from home on 10-26-2014. You may send your transmission at any time that day. If you have a wireless device, the transmission will be sent automatically. After your physician reviews your transmission, you will receive a postcard with your next transmission date.  Your physician recommends that you schedule a follow-up appointment in: 12 months with Dr.Lenny Bouchillon    Mikael Spray, MD  07/25/2014 2:29 PM    [address], [place], [place]  [address] Phone: [phone]; Fax: [phone]

## 2014-07-27 ENCOUNTER — Ambulatory Visit (INDEPENDENT_AMBULATORY_CARE_PROVIDER_SITE_OTHER): Payer: Medicare Other | Admitting: Cardiology

## 2014-07-27 ENCOUNTER — Encounter: Payer: Self-pay | Admitting: Cardiology

## 2014-07-27 VITALS — BP 124/62 | HR 60 | Ht 73.0 in | Wt 276.0 lb

## 2014-07-27 DIAGNOSIS — Z79899 Other long term (current) drug therapy: Secondary | ICD-10-CM | POA: Diagnosis not present

## 2014-07-27 DIAGNOSIS — R531 Weakness: Secondary | ICD-10-CM

## 2014-07-27 LAB — COMPLETE METABOLIC PANEL WITH GFR
ALK PHOS: 111 U/L (ref 39–117)
ALT: 9 U/L (ref 0–53)
AST: 21 U/L (ref 0–37)
Albumin: 4.1 g/dL (ref 3.5–5.2)
BUN: 20 mg/dL (ref 6–23)
CO2: 31 meq/L (ref 19–32)
Calcium: 9.4 mg/dL (ref 8.4–10.5)
Chloride: 93 mEq/L — ABNORMAL LOW (ref 96–112)
Creat: 4.8 mg/dL — ABNORMAL HIGH (ref 0.50–1.35)
GFR, EST AFRICAN AMERICAN: 15 mL/min — AB
GFR, EST NON AFRICAN AMERICAN: 13 mL/min — AB
Glucose, Bld: 62 mg/dL — ABNORMAL LOW (ref 70–99)
POTASSIUM: 3.8 meq/L (ref 3.5–5.3)
Sodium: 139 mEq/L (ref 135–145)
TOTAL PROTEIN: 8.2 g/dL (ref 6.0–8.3)
Total Bilirubin: 0.6 mg/dL (ref 0.2–1.2)

## 2014-07-27 NOTE — Patient Instructions (Signed)
Your physician recommends that you schedule a follow-up appointment in: one year with Dr. Percival Spanish  Have your labs done today

## 2014-07-27 NOTE — Progress Notes (Signed)
HPI The patient presents as a new patient. He moved here from [place]. He has end-stage renal disease secondary to uncontrolled hypertension. 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 He is on dialysis and actually tolerates this well. Despite hypotension he says he does not have to stop dialysis. He is active exercising on a treadmill and a bike routinely. With this he denies any cardiovascular symptoms. He chronically sleeps on 2 pillows. He doesn't have shortness of breath, PND or orthopnea. He has no weight gain or edema. He has no palpitations, presyncope or syncope.  I was able to review some incomplete records from [place]. What I do know is that his ejection fraction is currently 55% on echo last year. He did have his ICD interrogated  and he's had no arrhythmias. He's also followed now in pulmonary for treatment of his pulmonary hypertension.  Allergies  Allergen Reactions  . Ivp Dye [Iodinated Diagnostic Agents] Nausea And Vomiting  . Vasotec [Enalapril] Swelling    Current Outpatient Prescriptions  Medication Sig Dispense Refill  . ambrisentan (LETAIRIS) 10 MG tablet Take 10 mg by mouth daily.    Marland Kitchen amiodarone (PACERONE) 200 MG tablet Take 200 mg by mouth daily.    . calcium acetate (PHOSLO) 667 MG capsule Take 1 capsule by mouth 3 (three) times daily before meals.  6  . cinacalcet (SENSIPAR) 90 MG tablet Take 90 mg by mouth daily.    . ciprofloxacin (CIPRO) 500 MG tablet Take 1 tablet (500 mg total) by mouth 2 (two) times daily. 20 tablet 1  . levothyroxine (SYNTHROID, LEVOTHROID) 112 MCG tablet Take 112 mcg by mouth 2 (two) times daily.    . midodrine (PROAMATINE) 10 MG tablet Take 10 mg by mouth 3 (three) times daily.    Marland Kitchen sulfamethoxazole-trimethoprim (BACTRIM DS) 800-160 MG per tablet Take 1 tablet by mouth 2 (two) times daily. 30 tablet 1  . Tadalafil, PAH, (ADCIRCA) 20 MG TABS Take 20 mg by mouth 2 (two) times daily.    Marland Kitchen  warfarin (COUMADIN) 6 MG tablet Take 1 tablet (6 mg total) by mouth daily. Take as directed by the [facility]. 50 tablet 2   No current facility-administered medications for this visit.    Past Medical History  Diagnosis Date  . Atrial fibrillation   . Nonischemic cardiomyopathy   . ICD (implantable cardioverter-defibrillator) in place     Medtronic   . Renal failure     Dialysis  . HTN (hypertension)   . Thyroid cancer   . Pulmonary HTN     Past Surgical History  Procedure Laterality Date  . Thyroidectomy    . Dialysis fistula creation    . Kidney transplant    . Laparoscopic gastric banding    . Toe amputations      ROS:  As stated in the HPI and negative for all other systems.  PHYSICAL EXAM BP 124/62 mmHg  Pulse 60  Ht 6\' 1"  (1.854 m)  Wt 276 lb (125.193 kg)  BMI 36.42 kg/m2  GENERAL:  Well appearing HEENT:  Pupils equal round and reactive, fundi not visualized, oral mucosa unremarkable NECK:  No jugular venous distention, waveform within normal limits, carotid upstroke brisk and symmetric, no bruits, no thyromegaly LYMPHATICS:  No cervical, inguinal adenopathy LUNGS:  Clear to auscultation bilaterally BACK:  No CVA tenderness CHEST:  ICD pocket left side HEART:  PMI not displaced or sustained,S1 and S2 within normal limits, no  S3, no S4, no clicks, no rubs, 2/6 Aholosystolic murmur her in particular it is left upper and right upper sternal borders, no diastolic murmurs ABD:  Flat, positive bowel sounds normal in frequency in pitch, no bruits, no rebound, no guarding, no midline pulsatile mass, no hepatomegaly, no splenomegaly EXT:  2 plus pulses throughout, no edema, no cyanosis no clubbing dialysis fistula left upper arm,    ASSESSMENT AND PLAN  CARDIOMYOPATHY:    He seems to be euvolemic. I was able to get his records from [place]. His EF was actually 55% on the last echo though he did previously have a cardiomyopathy apparently.  The records are not  clear.  ATRIAL FIB:  He reduced his amiodarone to 200 mg daily at the last vist.. He is having his INR followed here. Of note he had no atrial fibrillation at the time of his device check a couple of days ago.  Given the history of atrial fibrillation previously and will keep the amiodarone on and continue his warfarin. He will get a basic metabolic profile and TSH today.  ICD:  He was followed up in our ICD clinic a couple of days ago. tolerate this. No change in therapy is indicated.he had no ICD firings or V. tach noted.   PULMONARY HTN:  He has seen Dr. Lake Bells and is doing well with the meds as listed and no change in therapy was planned.  HTN:  The blood pressure is at target. No change in medications is indicated. We will continue with therapeutic lifestyle changes (TLC).

## 2014-07-28 LAB — MDC_IDC_ENUM_SESS_TYPE_INCLINIC
Brady Statistic AP VP Percent: 0.1 % — CL
Brady Statistic AP VS Percent: 0.8 %
Brady Statistic AS VP Percent: 0.1 % — CL
Brady Statistic AS VS Percent: 99.1 %
Lead Channel Impedance Value: 722 Ohm
Lead Channel Pacing Threshold Amplitude: 1.25 V
Lead Channel Pacing Threshold Pulse Width: 0.8 ms
Lead Channel Pacing Threshold Pulse Width: 0.8 ms
Lead Channel Sensing Intrinsic Amplitude: 2.5 mV
MDC IDC MSMT LEADCHNL RA IMPEDANCE VALUE: 570 Ohm
MDC IDC MSMT LEADCHNL RA PACING THRESHOLD AMPLITUDE: 1 V
MDC IDC MSMT LEADCHNL RV SENSING INTR AMPL: 10.1 mV

## 2014-07-28 LAB — TSH: TSH: 2.767 u[IU]/mL (ref 0.350–4.500)

## 2014-08-01 ENCOUNTER — Ambulatory Visit (INDEPENDENT_AMBULATORY_CARE_PROVIDER_SITE_OTHER): Payer: Medicare Other

## 2014-08-01 VITALS — BP 146/67 | HR 69 | Resp 12

## 2014-08-01 DIAGNOSIS — L89891 Pressure ulcer of other site, stage 1: Secondary | ICD-10-CM

## 2014-08-01 DIAGNOSIS — G629 Polyneuropathy, unspecified: Secondary | ICD-10-CM

## 2014-08-01 DIAGNOSIS — I739 Peripheral vascular disease, unspecified: Secondary | ICD-10-CM

## 2014-08-01 DIAGNOSIS — L97524 Non-pressure chronic ulcer of other part of left foot with necrosis of bone: Secondary | ICD-10-CM

## 2014-08-01 DIAGNOSIS — M86672 Other chronic osteomyelitis, left ankle and foot: Secondary | ICD-10-CM

## 2014-08-01 NOTE — Progress Notes (Signed)
   Subjective:    Patient ID: Adam [name], male    DOB: 04-17-1961, 54 y.o.   MRN: [id number]  HPI SURGERY CONSULT FOR RT FOOT GREAT TOE.   Review of Systems no new findings or systemic changes noted patient still on dialysis does have history of diabetes with chronic osteomyelitis left great toe.     Objective:   Physical Exam Vascular status reveals thready to nonpalpable pedal pulses however temperature feels warm to cool turgor diminished there is no purulent discharge or drainage no odor no maceration actually looks better than it did 2 weeks ago when the first debrided patient still taking his clindamycin antibiotic as instructed and his continue with his dialysis 3 times weekly. Patient been doing dressing changes with Silvadene and gauze dressing as instructed. No ascending cellulitis no lymphangitis appears to be relatively stable likely chronic osteomyelitis there is deformity in the toe with still open ulceration over the dorsum of the hallux the nailbed appears to be obliterated at this time previous x-rays demonstrate lysis of the distal phalanx.       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 Patient is scheduled for amputation under IV sedation local anesthetic block at the [facility] tomorrow morning. She will return to his dialysis tomorrow afternoon shouldn't understands the risks and complications will continue with current antibiotics Septra DS and Cipro patient does couple days left will maintain his antibiotic regimen postoperatively as well. At this time and dressed a compressive dressing is reapplied to the hallux will be maintained by the patient still tomorrow morning surgery.  Harriet Masson DPM

## 2014-08-02 DIAGNOSIS — I739 Peripheral vascular disease, unspecified: Secondary | ICD-10-CM | POA: Diagnosis not present

## 2014-08-02 DIAGNOSIS — M86672 Other chronic osteomyelitis, left ankle and foot: Secondary | ICD-10-CM | POA: Diagnosis not present

## 2014-08-02 DIAGNOSIS — L89891 Pressure ulcer of other site, stage 1: Secondary | ICD-10-CM | POA: Diagnosis not present

## 2014-08-09 ENCOUNTER — Encounter: Payer: Self-pay | Admitting: Cardiovascular Disease

## 2014-08-09 LAB — PROTIME-INR: INR: 2 — AB (ref <=1.1)

## 2014-08-10 ENCOUNTER — Ambulatory Visit (INDEPENDENT_AMBULATORY_CARE_PROVIDER_SITE_OTHER): Payer: Medicare Other | Admitting: Podiatry

## 2014-08-10 ENCOUNTER — Encounter: Payer: Self-pay | Admitting: Podiatry

## 2014-08-10 ENCOUNTER — Ambulatory Visit (INDEPENDENT_AMBULATORY_CARE_PROVIDER_SITE_OTHER): Payer: Medicare Other

## 2014-08-10 DIAGNOSIS — Z9889 Other specified postprocedural states: Secondary | ICD-10-CM

## 2014-08-10 NOTE — Progress Notes (Signed)
Subjective:     Patient ID: Adam [name], male   DOB: 12-03-1960, 54 y.o.   MRN: [id number]  HPI patient states I'm doing well after surgery with minimal discomfort and able to walk without swelling   Review of Systems     Objective:   Physical Exam Vascular status unchanged with well-healing surgical site first metatarsal where the left hallux was amputated    Assessment:     Doing well postoperatively with wound edges coapted well and stitches intact    Plan:     Reapplied sterile dressing instructed on continued elevation and reduced activity and reappoint 2 weeks for suture removal or earlier if necessary

## 2014-08-11 ENCOUNTER — Ambulatory Visit (INDEPENDENT_AMBULATORY_CARE_PROVIDER_SITE_OTHER): Payer: Medicare Other | Admitting: Pharmacist Clinician (PhC)/ Clinical Pharmacy Specialist

## 2014-08-11 DIAGNOSIS — I480 Paroxysmal atrial fibrillation: Secondary | ICD-10-CM

## 2014-08-11 DIAGNOSIS — Z7901 Long term (current) use of anticoagulants: Secondary | ICD-10-CM

## 2014-08-18 LAB — PROTIME-INR: INR: 3.9 — AB (ref 0.9–1.1)

## 2014-08-21 ENCOUNTER — Ambulatory Visit (INDEPENDENT_AMBULATORY_CARE_PROVIDER_SITE_OTHER): Payer: Medicare Other | Admitting: Pharmacist Clinician (PhC)/ Clinical Pharmacy Specialist

## 2014-08-21 DIAGNOSIS — I480 Paroxysmal atrial fibrillation: Secondary | ICD-10-CM

## 2014-08-21 DIAGNOSIS — Z7901 Long term (current) use of anticoagulants: Secondary | ICD-10-CM

## 2014-08-21 LAB — PROTIME-INR: INR: 4.6 — AB (ref 0.9–1.1)

## 2014-08-22 ENCOUNTER — Encounter: Payer: Medicare Other | Admitting: Podiatry

## 2014-08-23 ENCOUNTER — Ambulatory Visit (INDEPENDENT_AMBULATORY_CARE_PROVIDER_SITE_OTHER): Payer: Medicare Other | Admitting: Podiatry

## 2014-08-23 ENCOUNTER — Encounter: Payer: Self-pay | Admitting: Podiatry

## 2014-08-23 VITALS — BP 90/46 | HR 48 | Resp 18

## 2014-08-23 DIAGNOSIS — Z9889 Other specified postprocedural states: Secondary | ICD-10-CM

## 2014-08-23 DIAGNOSIS — Z899 Acquired absence of limb, unspecified: Secondary | ICD-10-CM

## 2014-08-24 ENCOUNTER — Ambulatory Visit (INDEPENDENT_AMBULATORY_CARE_PROVIDER_SITE_OTHER): Payer: Medicare Other | Admitting: Pharmacist Clinician (PhC)/ Clinical Pharmacy Specialist

## 2014-08-24 DIAGNOSIS — I480 Paroxysmal atrial fibrillation: Secondary | ICD-10-CM

## 2014-08-24 DIAGNOSIS — Z7901 Long term (current) use of anticoagulants: Secondary | ICD-10-CM

## 2014-08-28 ENCOUNTER — Encounter: Payer: Self-pay | Admitting: Podiatry

## 2014-08-28 LAB — PROTIME-INR
INR: 3 — AB (ref 0.9–1.1)
INR: 3 — AB (ref <=1.1)

## 2014-08-28 NOTE — Progress Notes (Signed)
Patient ID: Adam [name], male   DOB: 11-15-1960, 54 y.o.   MRN: [id number]  Subjective: 54 year old male presents the office today status post right hallux amputation performed on 08/02/2014 with Dr. Blenda Mounts. He states he is continuing on antibiotics. He's remained in the surgical shoe. States that overall he is doing well and denies any systemic complaints as fevers, chills, nausea, vomiting. Denies any calf pain, chest pain, shortness of breath.   Objective : AAO 3, NAD  DP/PT pulses decreased bilaterally.  Protective sensation decreased with [name] monofilament  Incision status post hallux amputation is well coapted without any evidence of dehiscence. Sutures are intact. There is mild edema along the surgical site without any associated erythema, increase in warmth, drainage, malodor, or any other clinical signs of infection. No tenderness at this site. No other lesions or pre-ulcer lesions identified bilaterally. No pain with calf compression, swelling, warmth, erythema.   Assessment : 54 year old male status post right hallux amputation, doing well   Plan : -Treatment options discussed including alternatives, risks, complications  -Procedures removed without complications. Steri-Strips were applied. Discussed the patient on 24-hour symptoms are to shower lusters any problems with the incision. It is his [place] openers any problems to hold off on showering call the office. Monitoring signs or symptoms of infection and directed to call the office mediation occur go to the ER. Continue a surgical shoe.  -Follow-up in 1 week or sooner if any problems are to arise. Meantime encouraged to call the office with any questions, concerns, change in symptoms.

## 2014-08-31 ENCOUNTER — Ambulatory Visit (INDEPENDENT_AMBULATORY_CARE_PROVIDER_SITE_OTHER): Payer: Medicare Other | Admitting: Pharmacist Clinician (PhC)/ Clinical Pharmacy Specialist

## 2014-08-31 DIAGNOSIS — I480 Paroxysmal atrial fibrillation: Secondary | ICD-10-CM

## 2014-08-31 DIAGNOSIS — Z7901 Long term (current) use of anticoagulants: Secondary | ICD-10-CM

## 2014-09-01 ENCOUNTER — Encounter: Payer: Self-pay | Admitting: Podiatry

## 2014-09-01 ENCOUNTER — Ambulatory Visit (INDEPENDENT_AMBULATORY_CARE_PROVIDER_SITE_OTHER): Payer: Medicare Other | Admitting: Podiatry

## 2014-09-01 ENCOUNTER — Ambulatory Visit (INDEPENDENT_AMBULATORY_CARE_PROVIDER_SITE_OTHER): Payer: Medicare Other

## 2014-09-01 VITALS — BP 130/75 | Resp 12

## 2014-09-01 DIAGNOSIS — R0989 Other specified symptoms and signs involving the circulatory and respiratory systems: Secondary | ICD-10-CM

## 2014-09-01 DIAGNOSIS — Z9889 Other specified postprocedural states: Secondary | ICD-10-CM | POA: Diagnosis not present

## 2014-09-01 DIAGNOSIS — Z899 Acquired absence of limb, unspecified: Secondary | ICD-10-CM

## 2014-09-01 MED ORDER — SILVER SULFADIAZINE 1 % EX CREA: 1.0000 "application " | TOPICAL_CREAM | Freq: Every day | CUTANEOUS | Status: DC

## 2014-09-01 NOTE — Progress Notes (Signed)
   Subjective:    Patient ID: Adam [name], male    DOB: 1961/02/03, 54 y.o.   MRN: [id number]  HPI Comments: DOS 08/02/2014 amputation of left hallux  54 year old male presents the office today status post right hallux amputation. He states that he has been changing the dressing daily and he feels that the incision is healing. He continues to wear the surgical shoe. Denies any systemic complaints as fevers, chills, nausea, vomiting. Denies any calf pain, chest pain, shortness of breath. No other complaints at this time. No acute changes since last appointment.    Review of Systems  All other systems reviewed and are negative.      Objective:   Physical Exam AAO 3, NAD DP/PT pulses decreased bilaterally Protective sensation decreased with Simms Weinstein monofilament Incisional distal aspect of the hallux does appear to have a superficial granular ulceration measuring 1.5 x 1 cm. Surrounding the wound is hyperkeratotic skin. Upon debridement of the lesion no further ulceration is identified. There is no surrounding erythema, increase in warmth, drainage/purulence, malodor, fluctuance, crepitus, ascending cellulitis, or any other clinical signs of infection. No other open lesions identified bilaterally. No pain with calf compression, swelling, warmth, erythema.      Assessment & Plan:  54 year old male status post left hallux amputation with ulceration at incision site -Treatment options discussed including alternatives, risks, complications -At this time the wound was debrided and Silvadene was applied followed by dry sterile dressing. Recommended the patient to continue daily dressing changes with Silvadene. Silvadene was prescribed. -Will obtain ABIs, PVRs due to decreased pulses -Continue surgical shoe -Follow up in 2 weeks or sooner if any problems are to arise. Monitor for any signs or symptoms of infection and directed to call the office immediately should any occur ago directly to  the emergency room.

## 2014-09-02 ENCOUNTER — Encounter: Payer: Self-pay | Admitting: Podiatry

## 2014-09-04 LAB — PROTIME-INR: INR: 2.5 — AB (ref 0.9–1.1)

## 2014-09-07 ENCOUNTER — Ambulatory Visit (INDEPENDENT_AMBULATORY_CARE_PROVIDER_SITE_OTHER): Payer: Medicare Other | Admitting: Pharmacist Clinician (PhC)/ Clinical Pharmacy Specialist

## 2014-09-07 ENCOUNTER — Other Ambulatory Visit: Payer: Self-pay | Admitting: Podiatry

## 2014-09-07 DIAGNOSIS — I480 Paroxysmal atrial fibrillation: Secondary | ICD-10-CM

## 2014-09-07 DIAGNOSIS — Z7901 Long term (current) use of anticoagulants: Secondary | ICD-10-CM

## 2014-09-07 DIAGNOSIS — R0989 Other specified symptoms and signs involving the circulatory and respiratory systems: Secondary | ICD-10-CM

## 2014-09-13 LAB — PROTIME-INR: INR: 4.2 — AB (ref 0.9–1.1)

## 2014-09-14 ENCOUNTER — Ambulatory Visit (HOSPITAL_COMMUNITY)
Admission: RE | Admit: 2014-09-14 | Discharge: 2014-09-14 | Disposition: A | Discharge status: Home / Self Care | Payer: Medicare Other | Source: Ambulatory Visit | Attending: Cardiology | Admitting: Cardiology

## 2014-09-14 ENCOUNTER — Encounter (HOSPITAL_COMMUNITY): Payer: Medicare Other

## 2014-09-14 DIAGNOSIS — Z89429 Acquired absence of other toe(s), unspecified side: Secondary | ICD-10-CM | POA: Insufficient documentation

## 2014-09-14 DIAGNOSIS — R0989 Other specified symptoms and signs involving the circulatory and respiratory systems: Secondary | ICD-10-CM | POA: Diagnosis present

## 2014-09-15 ENCOUNTER — Ambulatory Visit (INDEPENDENT_AMBULATORY_CARE_PROVIDER_SITE_OTHER): Payer: Medicare Other | Admitting: Podiatry

## 2014-09-15 ENCOUNTER — Ambulatory Visit (INDEPENDENT_AMBULATORY_CARE_PROVIDER_SITE_OTHER): Payer: Medicare Other | Admitting: Pharmacist Clinician (PhC)/ Clinical Pharmacy Specialist

## 2014-09-15 ENCOUNTER — Ambulatory Visit (INDEPENDENT_AMBULATORY_CARE_PROVIDER_SITE_OTHER): Payer: Medicare Other

## 2014-09-15 ENCOUNTER — Encounter: Payer: Self-pay | Admitting: Podiatry

## 2014-09-15 VITALS — BP 154/85 | HR 74 | Resp 15

## 2014-09-15 DIAGNOSIS — Z9889 Other specified postprocedural states: Secondary | ICD-10-CM

## 2014-09-15 DIAGNOSIS — I480 Paroxysmal atrial fibrillation: Secondary | ICD-10-CM

## 2014-09-15 DIAGNOSIS — Z7901 Long term (current) use of anticoagulants: Secondary | ICD-10-CM

## 2014-09-15 NOTE — Progress Notes (Signed)
Patient ID: Adam [name], male   DOB: October 22, 1960, 54 y.o.   MRN: [id number]  Subjective: 54 year old male presents the office today status post left hallux amputation. He states he continues to change the dressing daily with Silvadene dressing changes. He continues with surgical shoe. He denies any drainage or purulence from around the wound. Denies any surrounding redness or any red streaks. Denies any systemic complaints as fevers, chills, nausea, vomiting. No other complaints at this time in no acute changes since last point.  Objective: AAO 3, NAD DP/PT pulses decreased bilaterally Decreased protective sensation with Sims once the monofilament Incisional the distal aspect of the hallux with a superficial granular ulceration measuring probably 1.3 x 0.9 cm. Wound basis fibro-granular. There is hyperkeratotic periwound. There is no drainage/purulence. There is no swelling erythema, ascending cellulitis. No malodor. No other open lesions or pre-ulcer lesions identified bilaterally. No pain with calf compression, swelling, warmth, erythema.  Assessment: 54 year old male with granular ulceration left hallux amputation site  Plan: -Treatment options were discussed included alternatives, risks, complications. -X-rays were obtained and reviewed the patient -Wound base sharply debrided without complications to healthy, bleeding, granular wound base. Continue with daily dressing changes with Silvadene. -Patient had vascular studies yesterday however I have not received the results yet. -Monitor for signs or symptoms of infection and directed to call the office immediately should any occur go to the ER. -Follow-up in 2 weeks or sooner if any problems are to arise. In the meantime call the office with any questions, concerns, changes symptoms.

## 2014-09-15 NOTE — Patient Instructions (Signed)
Continue daily dressing changes as discussed Monitor for any signs/symptoms of infection. Call the office immediately if any occur or go directly to the emergency room. Call with any questions/concerns.  

## 2014-09-24 ENCOUNTER — Other Ambulatory Visit: Payer: Self-pay | Admitting: Cardiology

## 2014-09-29 ENCOUNTER — Other Ambulatory Visit: Payer: Self-pay | Admitting: Podiatry

## 2014-09-29 ENCOUNTER — Ambulatory Visit (INDEPENDENT_AMBULATORY_CARE_PROVIDER_SITE_OTHER): Payer: Medicare Other | Admitting: Podiatry

## 2014-09-29 ENCOUNTER — Encounter: Payer: Self-pay | Admitting: Podiatry

## 2014-09-29 ENCOUNTER — Ambulatory Visit (INDEPENDENT_AMBULATORY_CARE_PROVIDER_SITE_OTHER): Payer: Medicare Other

## 2014-09-29 VITALS — BP 99/86 | HR 69 | Resp 12

## 2014-09-29 DIAGNOSIS — Z9889 Other specified postprocedural states: Secondary | ICD-10-CM

## 2014-09-29 DIAGNOSIS — I739 Peripheral vascular disease, unspecified: Secondary | ICD-10-CM

## 2014-09-29 NOTE — Patient Instructions (Signed)
Monitor for any signs/symptoms of infection. Call the office immediately if any occur or go directly to the emergency room. Call with any questions/concerns.  

## 2014-10-03 ENCOUNTER — Encounter: Payer: Self-pay | Admitting: Podiatry

## 2014-10-03 NOTE — Progress Notes (Signed)
Patient ID: Adam [name], male   DOB: [birth year], 54 y.o.   MRN: [id number]  Subjective: Mr. Calcagni presents the office today status post left hallux amputation with a wound at the amputation site. He states he continues to change the dressing daily with Silvadene dressing changes. He continues with surgical shoe at all times. He denies any drainage or purulence from around the wound. Denies any surrounding redness or any red streaks. Denies any systemic complaints as fevers, chills, nausea, vomiting. No other complaints at this time in no acute changes since last point.  Objective: AAO 3, NAD DP/PT pulses decreased bilaterally Decreased protective sensation with Simms Weinstein monofilament.  Incisional the distal aspect of the hallux with a superficial granular ulceration measuring probably 1 x 0.5 cm. Wound base is fibro-granular. There is hyperkeratotic periwound. There is no drainage/purulence. There is no swelling erythema, ascending cellulitis. No malodor. No other open lesions or pre-ulcer lesions identified bilaterally. No pain with calf compression, swelling, warmth, erythema.  Assessment: 54 year old male with granular ulceration left hallux amputation site  Plan: -Treatment options were discussed included alternatives, risks, complications. -X-rays were obtained and reviewed the patient -Wound base sharply debrided without complications to healthy, bleeding, granular wound base. Continue with daily dressing changes with Silvadene. -Vascular studies were reviewed with the patient. Due to slow healing wound, will refer to Dr. Gwenlyn Found for evaluation.  -Monitor for signs or symptoms of infection and directed to call the office immediately should any occur go to the ER. -Follow-up in 2 weeks or sooner if any problems are to arise. In the meantime call the office with any questions, concerns, changes symptoms.

## 2014-10-04 LAB — PROTIME-INR: INR: 2.3 — AB (ref 0.9–1.1)

## 2014-10-05 LAB — PROTIME-INR: INR: 2.3 — AB (ref <=1.1)

## 2014-10-06 ENCOUNTER — Ambulatory Visit (INDEPENDENT_AMBULATORY_CARE_PROVIDER_SITE_OTHER): Payer: Medicare Other | Admitting: Pharmacist Clinician (PhC)/ Clinical Pharmacy Specialist

## 2014-10-06 DIAGNOSIS — I480 Paroxysmal atrial fibrillation: Secondary | ICD-10-CM

## 2014-10-06 DIAGNOSIS — Z7901 Long term (current) use of anticoagulants: Secondary | ICD-10-CM

## 2014-10-11 LAB — PROTIME-INR: INR: 2.3 — AB (ref 0.9–1.1)

## 2014-10-13 ENCOUNTER — Ambulatory Visit (INDEPENDENT_AMBULATORY_CARE_PROVIDER_SITE_OTHER): Payer: Medicare Other | Admitting: Pharmacist Clinician (PhC)/ Clinical Pharmacy Specialist

## 2014-10-13 ENCOUNTER — Ambulatory Visit (INDEPENDENT_AMBULATORY_CARE_PROVIDER_SITE_OTHER): Payer: Medicare Other | Admitting: Cardiovascular Disease

## 2014-10-13 ENCOUNTER — Encounter: Payer: Self-pay | Admitting: Cardiovascular Disease

## 2014-10-13 ENCOUNTER — Ambulatory Visit: Payer: Medicare Other

## 2014-10-13 ENCOUNTER — Ambulatory Visit (INDEPENDENT_AMBULATORY_CARE_PROVIDER_SITE_OTHER): Payer: Medicare Other | Admitting: Podiatry

## 2014-10-13 VITALS — BP 72/60 | HR 72 | Ht 73.0 in | Wt 276.0 lb

## 2014-10-13 DIAGNOSIS — Z9889 Other specified postprocedural states: Secondary | ICD-10-CM

## 2014-10-13 DIAGNOSIS — Z7901 Long term (current) use of anticoagulants: Secondary | ICD-10-CM

## 2014-10-13 DIAGNOSIS — Z899 Acquired absence of limb, unspecified: Secondary | ICD-10-CM

## 2014-10-13 DIAGNOSIS — Z89422 Acquired absence of other left toe(s): Secondary | ICD-10-CM | POA: Diagnosis not present

## 2014-10-13 DIAGNOSIS — I480 Paroxysmal atrial fibrillation: Secondary | ICD-10-CM

## 2014-10-13 DIAGNOSIS — IMO0002 Reserved for concepts with insufficient information to code with codable children: Secondary | ICD-10-CM

## 2014-10-13 NOTE — Progress Notes (Signed)
Patient ID: Adam [name], male   DOB: 1961/01/21, 54 y.o.   MRN: [id number]  Subjective: [name] presents the office today status post left hallux amputation with a wound at the amputation site. He states he continues to change the dressing daily with Silvadene dressing changes. He continues with surgical shoe at all times. He denies any drainage or purulence from around the wound. He states the wound has greatly improved.  Denies any surrounding redness or any red streaks. Denies any systemic complaints as fevers, chills, nausea, vomiting. No other complaints at this time in no acute changes since last point.  Objective: AAO 3, NAD DP/PT pulses decreased bilaterally Decreased protective sensation with Simms Weinstein monofilament.  Incisional the distal aspect of the hallux with a superficial granular ulceration measuring approximately 0.4 x 0.4 cm. Wound base is fibro-granular. There is hyperkeratotic periwound. There is no drainage/purulence. There is no surrounding erythema, ascending cellulitis. No malodor. No other open lesions or pre-ulcer lesions identified bilaterally. No pain with calf compression, swelling, warmth, erythema.  Assessment: 54 year old male with granular ulceration left hallux amputation site  Plan: -Treatment options were discussed included alternatives, risks, complications. -Wound base sharply debrided without complications to healthy, bleeding, granular wound base. Continue with daily dressing changes with Silvadene. -Monitor for signs or symptoms of infection and directed to call the office immediately should any occur go to the ER. -Follow-up in 2 weeks or sooner if any problems are to arise. In the meantime call the office with any questions, concerns, changes symptoms.

## 2014-10-13 NOTE — Assessment & Plan Note (Signed)
Status post left hallux amputation by Dr. Blenda Mounts 08/01/14. This was slowly healing up until 3 weeks ago. He saw Dr. Jacqualyn Posey in the office today who demonstrated more complete healing without drainage. His lower extremity arterial Doppler studies performed on 09/14/14 revealed intact circulation to the foot. He denies claudication. He has had right hallux amputation several years ago.

## 2014-10-13 NOTE — Patient Instructions (Signed)
Follow up with Dr Berry as needed.  

## 2014-10-13 NOTE — Progress Notes (Signed)
10/13/2014 Adam [name]   03/22/1961  [id number]  Primary Physician No PCP Per Patient Primary Cardiologist: Lorretta Harp MD Renae Gloss   HPI:  Adam [name] is a 54 year old severely overweight divorced African-American male father of one child who recently relocated from [place] to [place] in December of last year. He is a retired Designer, industrial/product. He has a history of nonischemic myopathy, her physical atrial fibrillation on Coumadin anticoagulation. He's had an ICD placed prophylactically which was recently interrogated by Dr. Sallyanne Kuster 07/25/14 and found to be working correctly. He has chronic renal insufficiency status post renal transplant 2001 which failed 18 months later. He has an AV fistula in his left upper extremity. His nephrologist is Dr. Fleet Contras. His other problems include history of hypertension, thyroid cancer and pulmonary hypertension. He has had right hallux and Pediazole years ago and had his left hallux amputated on 07/30/14 which is slowly healing up until 3 weeks ago. He saw Dr. Jacqualyn Posey, his podiatrist, today who demonstrated appropriate healing and his lower extremity arterial Doppler studies performed on 09/14/14 revealed intact circulation on the left side.   Current Outpatient Prescriptions  Medication Sig Dispense Refill  . ambrisentan (LETAIRIS) 10 MG tablet Take 10 mg by mouth daily.    Marland Kitchen amiodarone (PACERONE) 200 MG tablet Take 200 mg by mouth daily.    . calcium acetate (PHOSLO) 667 MG capsule Take 1 capsule by mouth 3 (three) times daily before meals.  6  . cinacalcet (SENSIPAR) 90 MG tablet Take 90 mg by mouth daily.    Marland Kitchen levothyroxine (SYNTHROID, LEVOTHROID) 112 MCG tablet Take 112 mcg by mouth 2 (two) times daily.    . midodrine (PROAMATINE) 10 MG tablet Take 10 mg by mouth 3 (three) times daily.    . silver sulfADIAZINE (SILVADENE) 1 % cream Apply 1 application topically daily. 50 g 0  . Tadalafil, PAH, (ADCIRCA) 20 MG TABS  Take 20 mg by mouth 2 (two) times daily.    Marland Kitchen warfarin (COUMADIN) 6 MG tablet Take 1 to 1.5 tablets by mouth daily as directed by coumadin clinic 50 tablet 3   No current facility-administered medications for this visit.    Allergies  Allergen Reactions  . Ivp Dye [Iodinated Diagnostic Agents] Nausea And Vomiting  . Vasotec [Enalapril] Swelling    History   Social History  . Marital Status: Divorced    Spouse Name: N/A  . Number of Children: 1  . Years of Education: N/A   Occupational History  . Not on file.   Social History Main Topics  . Smoking status: Never Smoker   . Smokeless tobacco: Never Used  . Alcohol Use: No  . Drug Use: No  . Sexual Activity: Not on file   Other Topics Concern  . Not on file   Social History Narrative   Retired Archivist.       Review of Systems: General: negative for chills, fever, night sweats or weight changes.  Cardiovascular: negative for chest pain, dyspnea on exertion, edema, orthopnea, palpitations, paroxysmal nocturnal dyspnea or shortness of breath Dermatological: negative for rash Respiratory: negative for cough or wheezing Urologic: negative for hematuria Abdominal: negative for nausea, vomiting, diarrhea, bright red blood per rectum, melena, or hematemesis Neurologic: negative for visual changes, syncope, or dizziness All other systems reviewed and are otherwise negative except as noted above.    Blood pressure 72/60, pulse 72, height 6\' 1"  (1.854 m), weight 276 lb (125.193 kg).  General appearance: alert and no distress Neck: no adenopathy, no carotid bruit, no JVD, supple, symmetrical, trachea midline and thyroid not enlarged, symmetric, no tenderness/mass/nodules Lungs: clear to auscultation bilaterally Heart: regular rate and rhythm, S1, S2 normal, no murmur, click, rub or gallop Extremities: extremities normal, atraumatic, no cyanosis or edema and healing left hallux amputation  EKG not performed  today  ASSESSMENT AND PLAN:   Toe amputation status Status post left hallux amputation by Dr. Blenda Mounts 08/01/14. This was slowly healing up until 3 weeks ago. He saw Dr. Jacqualyn Posey in the office today who demonstrated more complete healing without drainage. His lower extremity arterial Doppler studies performed on 09/14/14 revealed intact circulation to the foot. He denies claudication. He has had right hallux amputation several years ago.       Lorretta Harp MD FACP,FACC,FAHA, [facility] 10/13/2014 10:59 AM

## 2014-10-17 ENCOUNTER — Ambulatory Visit (INDEPENDENT_AMBULATORY_CARE_PROVIDER_SITE_OTHER): Payer: Medicare Other | Admitting: Pulmonary Disease

## 2014-10-17 ENCOUNTER — Encounter: Payer: Self-pay | Admitting: Pulmonary Disease

## 2014-10-17 VITALS — BP 114/60 | HR 84 | Ht 72.0 in | Wt 280.0 lb

## 2014-10-17 DIAGNOSIS — I272 Pulmonary hypertension, unspecified: Secondary | ICD-10-CM

## 2014-10-17 DIAGNOSIS — I270 Primary pulmonary hypertension: Secondary | ICD-10-CM | POA: Diagnosis not present

## 2014-10-17 NOTE — Progress Notes (Signed)
Subjective:    Patient ID: Adam [name], male    DOB: [birth year], 54 y.o.   MRN: [id number]  Synopsis: Diagnosed with pulmonary hypertension in 2012. 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 Unfortunately his kidney transplant failed for uncertain reasons by [id number]. Was diagnosed with pulmonary hypertension by 2012 in [place]. Was started on [place] in [place] and moved to [place] not long afterwards.  HPI Chief Complaint  Patient presents with  . Follow-up    Pt doing well, no complaints today.  Still taking adcirca and letairis with no problems.     He has been doing well since the last visit. He remains active with walking on a daily basis. He does not have shortness of breath. He continues to have hypotension on dialysis days and takes midodrine on those days. He says that he does not have chest pain or leg swelling. Overall he is doing well.  Past Medical History  Diagnosis Date  . Atrial fibrillation   . Nonischemic cardiomyopathy   . ICD (implantable cardioverter-defibrillator) in place     [employer]   . Renal failure     Dialysis  . HTN (hypertension)   . Thyroid cancer   . Pulmonary HTN   . Great toe amputation status     status post left hallux amputation 08/01/14      Review of Systems     Objective:   Physical Exam Filed Vitals:   10/17/14 1638  BP: 114/60  Pulse: 84  Height: 6' (1.829 m)  Weight: 280 lb (127.007 kg)  SpO2: 94%   RA  Gen: well appearing HENT: OP clear,  neck supple PULM: CTA B, normal percussion CV: RRR, slight systolic murmur trace edema GI: BS+, soft, nontender Derm: no cyanosis or rash Psyche: normal mood and affect       Assessment & Plan:  Pulmonary hypertension This has been a stable interval for Mr. Polmanteer. He remains asymptomatic on his stable doses of Adcirca and Letaris.  He continues to have some mild hypotension on dialysis days for which she takes  midodrine.  Plan: We really need to try to get the records from his previous pulmonary hypertension physicians Continue Letairis and Adcirca at current doses I advised him to decrease his dose of Adcirca to 20 mg on dialysis days to prevent hypotension 6 minute walk Follow-up 3 months      Current outpatient prescriptions:  .  ambrisentan (LETAIRIS) 10 MG tablet, Take 10 mg by mouth daily., Disp: , Rfl:  .  amiodarone (PACERONE) 200 MG tablet, Take 200 mg by mouth daily., Disp: , Rfl:  .  calcium acetate (PHOSLO) 667 MG capsule, Take 1 capsule by mouth 3 (three) times daily before meals., Disp: , Rfl: 6 .  cinacalcet (SENSIPAR) 90 MG tablet, Take 90 mg by mouth daily., Disp: , Rfl:  .  levothyroxine (SYNTHROID, LEVOTHROID) 112 MCG tablet, Take 112 mcg by mouth 2 (two) times daily., Disp: , Rfl:  .  midodrine (PROAMATINE) 10 MG tablet, Take 10 mg by mouth 3 (three) times daily., Disp: , Rfl:  .  silver sulfADIAZINE (SILVADENE) 1 % cream, Apply 1 application topically daily., Disp: 50 g, Rfl: 0 .  Tadalafil, PAH, (ADCIRCA) 20 MG TABS, Take 20 mg by mouth 2 (two) times daily., Disp: , Rfl:  .  warfarin (COUMADIN) 6 MG tablet, Take 1 to 1.5 tablets by mouth daily as directed by coumadin clinic,  Disp: 50 tablet, Rfl: 3

## 2014-10-17 NOTE — Patient Instructions (Signed)
Take 20mg  adcirca on dialysis days instead of 40mg  daily Keep taking the Letairis as you are doing We will arrange a 6 minutes walk before the next visit in 3 months

## 2014-10-17 NOTE — Assessment & Plan Note (Addendum)
This has been a stable interval for Adam [name]. He remains asymptomatic on his stable doses of Adcirca and Letaris.  He continues to have some mild hypotension on dialysis days for which she takes midodrine.  Plan: We really need to try to get the records from his previous pulmonary hypertension physicians Continue Letairis and Adcirca at current doses I advised him to decrease his dose of Adcirca to 20 mg on dialysis days to prevent hypotension 6 minute walk Follow-up 3 months

## 2014-10-18 LAB — PROTIME-INR: INR: 4.2 — AB (ref 0.9–1.1)

## 2014-10-19 ENCOUNTER — Ambulatory Visit (INDEPENDENT_AMBULATORY_CARE_PROVIDER_SITE_OTHER): Payer: Medicare Other | Admitting: Pharmacist Clinician (PhC)/ Clinical Pharmacy Specialist

## 2014-10-19 DIAGNOSIS — Z7901 Long term (current) use of anticoagulants: Secondary | ICD-10-CM

## 2014-10-19 DIAGNOSIS — I480 Paroxysmal atrial fibrillation: Secondary | ICD-10-CM

## 2014-10-25 LAB — PROTIME-INR: INR: 1.8 — AB (ref 0.9–1.1)

## 2014-10-26 ENCOUNTER — Telehealth: Payer: Self-pay | Admitting: Cardiology

## 2014-10-26 ENCOUNTER — Encounter: Payer: Medicare Other | Admitting: *Deleted

## 2014-10-26 NOTE — Telephone Encounter (Signed)
Remote ICD transmission.   

## 2014-10-27 ENCOUNTER — Encounter: Payer: Medicare Other | Admitting: Podiatry

## 2014-10-27 ENCOUNTER — Telehealth: Payer: Self-pay | Admitting: Cardiovascular Disease

## 2014-10-27 ENCOUNTER — Ambulatory Visit (INDEPENDENT_AMBULATORY_CARE_PROVIDER_SITE_OTHER): Payer: Medicare Other | Admitting: Cardiology

## 2014-10-27 DIAGNOSIS — I480 Paroxysmal atrial fibrillation: Secondary | ICD-10-CM

## 2014-10-27 DIAGNOSIS — Z7901 Long term (current) use of anticoagulants: Secondary | ICD-10-CM

## 2014-10-27 NOTE — Telephone Encounter (Signed)
Follow Up  Pt calling to determine if messages were received from Device

## 2014-10-27 NOTE — Telephone Encounter (Signed)
Submitted transfer request in [facility]. Informed patient. Patient voiced understanding.

## 2014-10-30 ENCOUNTER — Encounter: Payer: Self-pay | Admitting: Cardiology

## 2014-11-01 LAB — PROTIME-INR
INR: 2.3 — AB (ref <=1.1)
INR: 2.3 — AB (ref 0.9–1.1)

## 2014-11-03 ENCOUNTER — Ambulatory Visit (INDEPENDENT_AMBULATORY_CARE_PROVIDER_SITE_OTHER): Payer: Medicare Other | Admitting: Pharmacist

## 2014-11-03 DIAGNOSIS — Z7901 Long term (current) use of anticoagulants: Secondary | ICD-10-CM

## 2014-11-03 DIAGNOSIS — I480 Paroxysmal atrial fibrillation: Secondary | ICD-10-CM

## 2014-11-06 ENCOUNTER — Ambulatory Visit (INDEPENDENT_AMBULATORY_CARE_PROVIDER_SITE_OTHER): Payer: Medicare Other | Admitting: Podiatry

## 2014-11-06 ENCOUNTER — Encounter: Payer: Self-pay | Admitting: Podiatry

## 2014-11-06 ENCOUNTER — Ambulatory Visit (INDEPENDENT_AMBULATORY_CARE_PROVIDER_SITE_OTHER): Payer: Medicare Other

## 2014-11-06 VITALS — BP 138/64 | HR 62 | Resp 12

## 2014-11-06 DIAGNOSIS — G629 Polyneuropathy, unspecified: Secondary | ICD-10-CM | POA: Diagnosis not present

## 2014-11-06 DIAGNOSIS — I739 Peripheral vascular disease, unspecified: Secondary | ICD-10-CM | POA: Diagnosis not present

## 2014-11-06 DIAGNOSIS — Z899 Acquired absence of limb, unspecified: Secondary | ICD-10-CM

## 2014-11-06 NOTE — Progress Notes (Signed)
Patient ID: Adam [name], male   DOB: 1960/10/13, 54 y.o.   MRN: [id number]  Subjective: Mr. Orrick presents the office today status post left hallux amputation with a wound at the amputation site. He states that since last appointment he is doing well and he believes of the wound is closed. He was continue with Silvadene dressing changes however since the wound is closed he has discontinued it. He denies any drainage or purulence from around the wound site. Denies any surrounding redness or any red streaks. Denies any systemic complaints as fevers, chills, nausea, vomiting. No other complaints at this time in no acute changes since last point.  Objective: AAO 3, NAD DP/PT pulses decreased bilaterally Decreased protective sensation with Simms Weinstein monofilament.  Incisional the distal aspect of the hallux with hyperkeratotic tissue the distal aspect of the amputation site. Upon debridement the underlying ulceration appears to be healed at this time. There is no swelling erythema, ascending cellulitis, fluctuance, crepitus, drainage, malodor.  No open lesions or pre-ulcer lesions identified bilaterally. There is no irritation of the right hallux, second digit, left hallux, left partial second digit. All the amputation sites appear to be healed. No pain with calf compression, swelling, warmth, erythema.  Assessment: 54 year old male with healed ulceration left hallux amputation site  Plan: -Treatment options were discussed included alternatives, risks, complications. -Hyperkeratotic lesions sharply without complications. -Given multiple imitation to likely benefit from inserts with toe filler. Prescription for this was given to him for [employer]otech. -Monitor for signs or symptoms of infection and directed to call the office immediately should any occur go to the ER. -Follow-up in 4 weeks or sooner if any problems are to arise. In the meantime call the office with any questions, concerns, changes  symptoms.  Celesta Gentile, DPM

## 2014-11-07 ENCOUNTER — Ambulatory Visit (INDEPENDENT_AMBULATORY_CARE_PROVIDER_SITE_OTHER): Payer: Medicare Other | Admitting: Family Medicine

## 2014-11-07 ENCOUNTER — Encounter: Payer: Self-pay | Admitting: Family Medicine

## 2014-11-07 VITALS — BP 76/52 | HR 74 | Temp 97.7°F | Ht 72.0 in | Wt 274.0 lb

## 2014-11-07 DIAGNOSIS — I959 Hypotension, unspecified: Secondary | ICD-10-CM

## 2014-11-07 DIAGNOSIS — I4891 Unspecified atrial fibrillation: Secondary | ICD-10-CM | POA: Diagnosis not present

## 2014-11-07 DIAGNOSIS — Z0000 Encounter for general adult medical examination without abnormal findings: Secondary | ICD-10-CM | POA: Diagnosis not present

## 2014-11-07 DIAGNOSIS — E669 Obesity, unspecified: Secondary | ICD-10-CM | POA: Diagnosis not present

## 2014-11-07 DIAGNOSIS — Z85850 Personal history of malignant neoplasm of thyroid: Secondary | ICD-10-CM | POA: Diagnosis not present

## 2014-11-07 DIAGNOSIS — E7800 Pure hypercholesterolemia, unspecified: Secondary | ICD-10-CM

## 2014-11-07 DIAGNOSIS — Z9884 Bariatric surgery status: Secondary | ICD-10-CM

## 2014-11-07 LAB — LIPID PANEL
Cholesterol: 273 mg/dL — ABNORMAL HIGH (ref 0–200)
HDL: 30 mg/dL — ABNORMAL LOW (ref >=40)
LDL CALC: 186 mg/dL — AB (ref 0–99)
TRIGLYCERIDES: 284 mg/dL — AB (ref <150)
Total CHOL/HDL Ratio: 9.1 Ratio
VLDL: 57 mg/dL — AB (ref 0–40)

## 2014-11-07 NOTE — Assessment & Plan Note (Signed)
Likely related to pulmonary hypertension medications and dialysis. On midodrine. Currently stable. No changes at this time.

## 2014-11-07 NOTE — Progress Notes (Signed)
Subjective:     Adam [name] is a 54 y.o. male and is here for to establish care comprehensive physical exam. The patient reports current no problems.  Hypotension Patient reports that he has had low blood pressure for the past 5-6 years since having his lap band procedure. Was previously having high blood pressure to teh SBP of 200s. Blood pressure is usually low after dialysis.   History of Lap-Band Lap band was removed in 2014 due to infection of his port. States that he has gained about 15-20 pounds since the removal of the poor. Lost approximately 200 pounds while he had the band in place. Patient reports that he still needs to lose more weight before being eligible for a kidney transplant.    History   Social History  . Marital Status: Divorced    Spouse Name: N/A  . Number of Children: 1  . Years of Education: N/A   Occupational History  . Not on file.   Social History Main Topics  . Smoking status: Never Smoker   . Smokeless tobacco: Never Used  . Alcohol Use: No  . Drug Use: No  . Sexual Activity: Not on file   Other Topics Concern  . Not on file   Social History Narrative   Retired Archivist.     Health Maintenance  Topic Date Due  . HIV Screening  10/22/1977  . TETANUS/TDAP  10/22/1981  . INFLUENZA VACCINE  12/11/2014  . COLONOSCOPY  11/06/2017    The following portions of the patient's history were reviewed and updated as appropriate: allergies, current medications, past family history, past medical history, past social history, past surgical history and problem list.  Review of Systems A comprehensive review of systems was negative.   Objective:    BP 76/52 mmHg  Pulse 74  Temp(Src) 97.7 F (36.5 C) (Oral)  Ht 6' (1.829 m)  Wt 274 lb (124.286 kg)  BMI 37.15 kg/m2 General appearance: alert, cooperative, appears stated age, no distress and moderately obese Head: Normocephalic, without obvious abnormality, atraumatic Eyes:  conjunctivae/corneas clear. PERRL, EOM's intact. Fundi benign. Throat: lips, mucosa, and tongue normal; teeth and gums normal Neck: no adenopathy, no carotid bruit, no JVD, supple, symmetrical, trachea midline and thyroid not enlarged, symmetric, no tenderness/mass/nodules Back: symmetric, no curvature. ROM normal. No CVA tenderness. Lungs: clear to auscultation bilaterally Heart: regular rate and rhythm, S1, S2 normal, no murmur, click, rub or gallop Abdomen: soft, non-tender; bowel sounds normal; no masses,  no organomegaly Extremities: extremities normal, atraumatic, no cyanosis or edema Pulses: 2+ and symmetric Skin: Skin color, texture, turgor normal. No rashes or lesions Neurologic: Alert and oriented X 3, normal strength and tone. Normal symmetric reflexes. Normal coordination and gait    Assessment:    Healthy male exam.      Plan:     See After Visit Summary for Counseling Recommendations

## 2014-11-07 NOTE — Assessment & Plan Note (Signed)
Will check lipid panel today. Also sent in referral to surgery for possible repeat gastric banding.

## 2014-11-07 NOTE — Assessment & Plan Note (Signed)
Will check lipid panel today. Reports prior colonoscopy 7 years ago with was reportedly normal. Defers HIV screening and tdap today.

## 2014-11-07 NOTE — Patient Instructions (Signed)
Thank you for coming to our clinic today!  We will be checking your cholesterol levels today.   We also sent in a referral to surgery to discuss repeating your banding procedure.  Please come back in 6-12 months or sooner if you need anything else.

## 2014-11-08 LAB — PROTIME-INR: INR: 2.6 — AB (ref <=1.1)

## 2014-11-09 ENCOUNTER — Telehealth: Payer: Self-pay | Admitting: Pulmonary Disease

## 2014-11-09 ENCOUNTER — Encounter: Payer: Self-pay | Admitting: Family Medicine

## 2014-11-09 DIAGNOSIS — E7800 Pure hypercholesterolemia, unspecified: Secondary | ICD-10-CM | POA: Insufficient documentation

## 2014-11-09 MED ORDER — AMBRISENTAN 10 MG PO TABS: 10.0000 mg | ORAL_TABLET | Freq: Every day | ORAL | Status: DC

## 2014-11-09 NOTE — Telephone Encounter (Signed)
Verbal rx has been given to [place] at [facility]. Nothing further was needed.

## 2014-11-09 NOTE — Assessment & Plan Note (Signed)
ADDENDUM: Results reviewed. Elevated cholesterol. Would benefit from starting statin. Will discuss at next appointment. Tried calling patient but number disconnected. Letter sent.

## 2014-11-10 ENCOUNTER — Ambulatory Visit (INDEPENDENT_AMBULATORY_CARE_PROVIDER_SITE_OTHER): Payer: Medicare Other | Admitting: Pharmacist Clinician (PhC)/ Clinical Pharmacy Specialist

## 2014-11-10 DIAGNOSIS — Z7901 Long term (current) use of anticoagulants: Secondary | ICD-10-CM

## 2014-11-10 DIAGNOSIS — I4891 Unspecified atrial fibrillation: Secondary | ICD-10-CM

## 2014-11-15 LAB — PROTIME-INR: INR: 3.2 — AB (ref 0.9–1.1)

## 2014-11-16 ENCOUNTER — Ambulatory Visit (INDEPENDENT_AMBULATORY_CARE_PROVIDER_SITE_OTHER): Payer: Medicare Other | Admitting: Pharmacist Clinician (PhC)/ Clinical Pharmacy Specialist

## 2014-11-16 DIAGNOSIS — I4891 Unspecified atrial fibrillation: Secondary | ICD-10-CM

## 2014-11-16 DIAGNOSIS — Z7901 Long term (current) use of anticoagulants: Secondary | ICD-10-CM

## 2014-11-22 LAB — PROTIME-INR: INR: 3.7 — AB (ref <=1.1)

## 2014-11-28 ENCOUNTER — Telehealth: Payer: Self-pay

## 2014-11-28 NOTE — Telephone Encounter (Signed)
Error

## 2014-11-29 ENCOUNTER — Encounter: Payer: Self-pay | Admitting: Pharmacist Clinician (PhC)/ Clinical Pharmacy Specialist

## 2014-11-29 ENCOUNTER — Ambulatory Visit (INDEPENDENT_AMBULATORY_CARE_PROVIDER_SITE_OTHER): Payer: Medicare Other | Admitting: Pharmacist Clinician (PhC)/ Clinical Pharmacy Specialist

## 2014-11-29 DIAGNOSIS — I4891 Unspecified atrial fibrillation: Secondary | ICD-10-CM

## 2014-11-29 DIAGNOSIS — Z7901 Long term (current) use of anticoagulants: Secondary | ICD-10-CM

## 2014-11-29 LAB — PROTIME-INR: INR: 5.2 — AB (ref 0.9–1.1)

## 2014-12-01 ENCOUNTER — Ambulatory Visit (INDEPENDENT_AMBULATORY_CARE_PROVIDER_SITE_OTHER): Payer: Medicare Other | Admitting: Pharmacist Clinician (PhC)/ Clinical Pharmacy Specialist

## 2014-12-01 DIAGNOSIS — I4891 Unspecified atrial fibrillation: Secondary | ICD-10-CM

## 2014-12-01 DIAGNOSIS — Z7901 Long term (current) use of anticoagulants: Secondary | ICD-10-CM

## 2014-12-04 ENCOUNTER — Ambulatory Visit: Payer: Medicare Other | Admitting: Podiatry

## 2014-12-06 LAB — PROTIME-INR: INR: 1.8 — AB (ref 0.9–1.1)

## 2014-12-13 ENCOUNTER — Ambulatory Visit (INDEPENDENT_AMBULATORY_CARE_PROVIDER_SITE_OTHER): Payer: Medicare Other | Admitting: Pharmacist Clinician (PhC)/ Clinical Pharmacy Specialist

## 2014-12-13 DIAGNOSIS — I4891 Unspecified atrial fibrillation: Secondary | ICD-10-CM

## 2014-12-13 DIAGNOSIS — Z7901 Long term (current) use of anticoagulants: Secondary | ICD-10-CM

## 2014-12-13 LAB — PROTIME-INR: INR: 3.4 — AB (ref 0.9–1.1)

## 2014-12-14 ENCOUNTER — Ambulatory Visit (INDEPENDENT_AMBULATORY_CARE_PROVIDER_SITE_OTHER): Payer: Medicare Other | Admitting: Pharmacist Clinician (PhC)/ Clinical Pharmacy Specialist

## 2014-12-14 ENCOUNTER — Telehealth: Payer: Self-pay | Admitting: Pharmacist Clinician (PhC)/ Clinical Pharmacy Specialist

## 2014-12-14 DIAGNOSIS — I4891 Unspecified atrial fibrillation: Secondary | ICD-10-CM

## 2014-12-14 DIAGNOSIS — Z7901 Long term (current) use of anticoagulants: Secondary | ICD-10-CM

## 2014-12-14 NOTE — Telephone Encounter (Signed)
Stat INR Results

## 2014-12-14 NOTE — Telephone Encounter (Signed)
See anticoag note

## 2014-12-18 ENCOUNTER — Ambulatory Visit (INDEPENDENT_AMBULATORY_CARE_PROVIDER_SITE_OTHER): Payer: Medicare Other

## 2014-12-18 ENCOUNTER — Ambulatory Visit (INDEPENDENT_AMBULATORY_CARE_PROVIDER_SITE_OTHER): Payer: Medicare Other | Admitting: Podiatry

## 2014-12-18 ENCOUNTER — Encounter: Payer: Self-pay | Admitting: Podiatry

## 2014-12-18 VITALS — BP 140/80 | HR 76 | Resp 12

## 2014-12-18 DIAGNOSIS — Z899 Acquired absence of limb, unspecified: Secondary | ICD-10-CM

## 2014-12-18 DIAGNOSIS — M86172 Other acute osteomyelitis, left ankle and foot: Secondary | ICD-10-CM | POA: Diagnosis not present

## 2014-12-18 DIAGNOSIS — I96 Gangrene, not elsewhere classified: Secondary | ICD-10-CM

## 2014-12-18 NOTE — Patient Instructions (Signed)
Pre-Operative Instructions  Congratulations, you have decided to take an important step to improving your quality of life.  You can be assured that the doctors of [facility] will be with you every step of the way.  1. Plan to be at the surgery center/hospital at least 1 (one) hour prior to your scheduled time unless otherwise directed by the surgical center/hospital staff.  You must have a responsible adult accompany you, remain during the surgery and drive you home.  Make sure you have directions to the surgical center/hospital and know how to get there on time. 2. For hospital based surgery you will need to obtain a history and physical form from your family physician within 1 month prior to the date of surgery- we will give you a form for you primary physician.  3. We make every effort to accommodate the date you request for surgery.  There are however, times where surgery dates or times have to be moved.  We will contact you as soon as possible if a change in schedule is required.   4. No Aspirin/Ibuprofen for one week before surgery.  If you are on aspirin, any non-steroidal anti-inflammatory medications (Mobic, Aleve, Ibuprofen) you should stop taking it 7 days prior to your surgery.  You make take Tylenol  For pain prior to surgery.  5. Medications- If you are taking daily heart and blood pressure medications, seizure, reflux, allergy, asthma, anxiety, pain or diabetes medications, make sure the surgery center/hospital is aware before the day of surgery so they may notify you which medications to take or avoid the day of surgery. 6. No food or drink after midnight the night before surgery unless directed otherwise by surgical center/hospital staff. 7. No alcoholic beverages 24 hours prior to surgery.  No smoking 24 hours prior to or 24 hours after surgery. 8. Wear loose pants or shorts- loose enough to fit over bandages, boots, and casts. 9. No slip on shoes, sneakers are best. 10. Bring  your boot with you to the surgery center/hospital.  Also bring crutches or a walker if your physician has prescribed it for you.  If you do not have this equipment, it will be provided for you after surgery. 11. If you have not been contracted by the surgery center/hospital by the day before your surgery, call to confirm the date and time of your surgery. 12. Leave-time from work may vary depending on the type of surgery you have.  Appropriate arrangements should be made prior to surgery with your employer. 13. Prescriptions will be provided immediately following surgery by your doctor.  Have these filled as soon as possible after surgery and take the medication as directed. 14. Remove nail polish on the operative foot. 15. Wash the night before surgery.  The night before surgery wash the foot and leg well with the antibacterial soap provided and water paying special attention to beneath the toenails and in between the toes.  Rinse thoroughly with water and dry well with a towel.  Perform this wash unless told not to do so by your physician.  Enclosed: 1 Ice pack (please put in freezer the night before surgery)   1 Hibiclens skin cleaner   Pre-op Instructions  If you have any questions regarding the instructions, do not hesitate to call our office.  [place]: [address] [place], [place] [address] [phone]  [place]: [address] [place], [place] [address] [phone]  [place]: [address]  [place], [place] [address] [phone]  Dr. Richard   Tuchman DPM, Dr. Norman Regal DPM Dr. Richard Sikora DPM, Dr. M. Todd Hyatt DPM, Dr. Kathryn Egerton DPM 

## 2014-12-19 ENCOUNTER — Encounter: Payer: Self-pay | Admitting: Podiatry

## 2014-12-19 MED ORDER — CLINDAMYCIN HCL 300 MG PO CAPS: 300.0000 mg | ORAL_CAPSULE | Freq: Three times a day (TID) | ORAL | Status: DC

## 2014-12-19 NOTE — Progress Notes (Signed)
Patient ID: Adam [name], male   DOB: 02/14/1961, 54 y.o.   MRN: [id number]  Subjective: 54 year old male presents the office today for new complaint. He states that his left third toe over the last week has started to have skin peel off the toe including the nail. He has also noticed the toe is become black and has a smell to it. He does not think that much bone is left in the toe and was supported the toe is fallen off. He denies any surrounding redness or any red streaks. He denies any systemic complaints as fevers, chills, nausea, vomiting. He is not currently on antibiotics. He states of the amputation site the left hallux is healed. No other complaints at this time.  Objective: AAO 3, NAD DP/PT pulses decreased bilaterally Protective sensation decreased with Simms Weinstein monofilament Incision overlying the hallux is healed at this time and there is no ulceration or wound present. Partial limitation of the second digit. Third digit on the left is gangrene with macerated tissue around the periphery is a small amount of serous segments drainage expressing the toe. There is also exposed bone of the distal portion of the toe. There is no surrounding erythema, ascending cellulitis. There is malodor present. There does not appear to be any areas of fluctuance or crepitus. No other open lesions or pre-ulcerative lesions identified bilaterally. No pain with calf compression, swelling, warmth, erythema.  Assessment: 54 year old male left third toe gangrene/osteomyelitis, infection  Plan: -X-rays were obtained and reviewed with the patient.  -Treatment options discussed including all alternatives, risks, and complications -At this time given the gangrene to the toe, malodor and exposed bone I recommended amputation of the toe. Patient agrees to this. He did go on to heal the other amputation sites although somewhat slowly. -I discussed with him left third digit amputation, possible partial third ray  amputation. -The incision placement as well as the postoperative course was discussed with the patient. I discussed risks of the surgery which include, but not limited to, infection, bleeding, pain, swelling, need for further surgery, delayed or nonhealing, painful or ugly scar, numbness or sensation changes, over/under correction, recurrence, transfer lesions, further deformity, hardware failure, DVT/PE, loss of toe/foot. Patient understands these risks and wishes to proceed with surgery. The surgical consent was reviewed with the patient all 3 pages were signed. No promises or guarantees were given to the outcome of the procedure. All questions were answered to the best of my ability. Before the surgery the patient was encouraged to call the office if there is any further questions. The surgery will be performed at the [facility] on an outpatient basis. Surgery be performed on 12/20/2014. -Clindamycin -Betadine wet to dry dressing.   Celesta Gentile, DPM

## 2014-12-20 ENCOUNTER — Encounter: Payer: Self-pay | Admitting: Podiatry

## 2014-12-20 DIAGNOSIS — M86179 Other acute osteomyelitis, unspecified ankle and foot: Secondary | ICD-10-CM | POA: Diagnosis not present

## 2014-12-20 DIAGNOSIS — I96 Gangrene, not elsewhere classified: Secondary | ICD-10-CM | POA: Diagnosis not present

## 2014-12-21 ENCOUNTER — Telehealth: Payer: Self-pay | Admitting: *Deleted

## 2014-12-21 NOTE — Telephone Encounter (Signed)
Courtesy call, pt states he hasn't had much pain and has not taken much of the pain medication yet.  I encouraged pt to take the pain medication, before the pain got ahead of him, but if he did not need it that would be fine also.

## 2014-12-21 NOTE — Progress Notes (Signed)
DOS 12/20/2014 Left 3rd digit amputation, possible partial 3rd ray amputation (toe and metatarsal).

## 2014-12-22 ENCOUNTER — Telehealth: Payer: Self-pay | Admitting: *Deleted

## 2014-12-22 ENCOUNTER — Encounter: Payer: Self-pay | Admitting: Podiatry

## 2014-12-22 ENCOUNTER — Ambulatory Visit (INDEPENDENT_AMBULATORY_CARE_PROVIDER_SITE_OTHER): Payer: Medicare Other

## 2014-12-22 ENCOUNTER — Ambulatory Visit (INDEPENDENT_AMBULATORY_CARE_PROVIDER_SITE_OTHER): Payer: Medicare Other | Admitting: Podiatry

## 2014-12-22 VITALS — BP 142/69 | HR 78 | Resp 12

## 2014-12-22 DIAGNOSIS — Z899 Acquired absence of limb, unspecified: Secondary | ICD-10-CM

## 2014-12-22 NOTE — Telephone Encounter (Signed)
Erin states pt's wound specimen was sent to [facility] for wound culture, was that to be for Wound Culture and sensitivity and Gram Stain?  The left 3rd toe was sent to [facility] for path studies.  I told Junie Panning the wound culture was C & S, with gram stain.

## 2014-12-24 ENCOUNTER — Inpatient Hospital Stay (HOSPITAL_COMMUNITY): Payer: Medicare Other

## 2014-12-24 ENCOUNTER — Inpatient Hospital Stay (HOSPITAL_COMMUNITY)
Admission: EM | Admit: 2014-12-24 | Discharge: 2014-12-25 | DRG: 270 | Disposition: A | Discharge status: Home / Self Care | Payer: Medicare Other | Attending: Vascular Surgery | Admitting: Vascular Surgery

## 2014-12-24 ENCOUNTER — Encounter (HOSPITAL_COMMUNITY): Payer: Self-pay | Admitting: Emergency Medicine

## 2014-12-24 DIAGNOSIS — I4891 Unspecified atrial fibrillation: Secondary | ICD-10-CM | POA: Diagnosis present

## 2014-12-24 DIAGNOSIS — Z91041 Radiographic dye allergy status: Secondary | ICD-10-CM

## 2014-12-24 DIAGNOSIS — Z95810 Presence of automatic (implantable) cardiac defibrillator: Secondary | ICD-10-CM

## 2014-12-24 DIAGNOSIS — Z7901 Long term (current) use of anticoagulants: Secondary | ICD-10-CM | POA: Diagnosis not present

## 2014-12-24 DIAGNOSIS — Z8249 Family history of ischemic heart disease and other diseases of the circulatory system: Secondary | ICD-10-CM

## 2014-12-24 DIAGNOSIS — Y828 Other medical devices associated with adverse incidents: Secondary | ICD-10-CM | POA: Diagnosis present

## 2014-12-24 DIAGNOSIS — I428 Other cardiomyopathies: Secondary | ICD-10-CM | POA: Diagnosis present

## 2014-12-24 DIAGNOSIS — I120 Hypertensive chronic kidney disease with stage 5 chronic kidney disease or end stage renal disease: Secondary | ICD-10-CM | POA: Diagnosis present

## 2014-12-24 DIAGNOSIS — I9581 Postprocedural hypotension: Secondary | ICD-10-CM | POA: Diagnosis not present

## 2014-12-24 DIAGNOSIS — T45515A Adverse effect of anticoagulants, initial encounter: Secondary | ICD-10-CM | POA: Diagnosis present

## 2014-12-24 DIAGNOSIS — Z940 Kidney transplant status: Secondary | ICD-10-CM

## 2014-12-24 DIAGNOSIS — Z89412 Acquired absence of left great toe: Secondary | ICD-10-CM

## 2014-12-24 DIAGNOSIS — L98499 Non-pressure chronic ulcer of skin of other sites with unspecified severity: Secondary | ICD-10-CM | POA: Diagnosis present

## 2014-12-24 DIAGNOSIS — I272 Other secondary pulmonary hypertension: Secondary | ICD-10-CM | POA: Diagnosis present

## 2014-12-24 DIAGNOSIS — Z888 Allergy status to other drugs, medicaments and biological substances status: Secondary | ICD-10-CM

## 2014-12-24 DIAGNOSIS — T82868A Thrombosis of vascular prosthetic devices, implants and grafts, initial encounter: Secondary | ICD-10-CM | POA: Diagnosis not present

## 2014-12-24 DIAGNOSIS — Z79899 Other long term (current) drug therapy: Secondary | ICD-10-CM

## 2014-12-24 DIAGNOSIS — N186 End stage renal disease: Secondary | ICD-10-CM

## 2014-12-24 DIAGNOSIS — R791 Abnormal coagulation profile: Secondary | ICD-10-CM | POA: Diagnosis present

## 2014-12-24 DIAGNOSIS — Z85850 Personal history of malignant neoplasm of thyroid: Secondary | ICD-10-CM | POA: Diagnosis not present

## 2014-12-24 DIAGNOSIS — Z841 Family history of disorders of kidney and ureter: Secondary | ICD-10-CM | POA: Diagnosis not present

## 2014-12-24 DIAGNOSIS — Z01810 Encounter for preprocedural cardiovascular examination: Secondary | ICD-10-CM

## 2014-12-24 DIAGNOSIS — Z9884 Bariatric surgery status: Secondary | ICD-10-CM | POA: Diagnosis not present

## 2014-12-24 DIAGNOSIS — E890 Postprocedural hypothyroidism: Secondary | ICD-10-CM | POA: Diagnosis present

## 2014-12-24 DIAGNOSIS — Z992 Dependence on renal dialysis: Secondary | ICD-10-CM | POA: Diagnosis not present

## 2014-12-24 DIAGNOSIS — T82838A Hemorrhage of vascular prosthetic devices, implants and grafts, initial encounter: Principal | ICD-10-CM | POA: Diagnosis present

## 2014-12-24 DIAGNOSIS — D6832 Hemorrhagic disorder due to extrinsic circulating anticoagulants: Secondary | ICD-10-CM

## 2014-12-24 LAB — BASIC METABOLIC PANEL
ANION GAP: 20 — AB (ref 5–15)
BUN: 31 mg/dL — ABNORMAL HIGH (ref 6–20)
CO2: 22 mmol/L (ref 22–32)
CREATININE: 7.71 mg/dL — AB (ref 0.61–1.24)
Calcium: 7.5 mg/dL — ABNORMAL LOW (ref 8.9–10.3)
Chloride: 92 mmol/L — ABNORMAL LOW (ref 101–111)
GFR calc Af Amer: 8 mL/min — ABNORMAL LOW (ref >60)
GFR calc non Af Amer: 7 mL/min — ABNORMAL LOW (ref >60)
GLUCOSE: 77 mg/dL (ref 65–99)
Potassium: 4.4 mmol/L (ref 3.5–5.1)
Sodium: 134 mmol/L — ABNORMAL LOW (ref 135–145)

## 2014-12-24 LAB — TYPE AND SCREEN
ABO/RH(D): O POS
Antibody Screen: NEGATIVE

## 2014-12-24 LAB — CBC
HEMATOCRIT: 37.8 % — AB (ref 39.0–52.0)
Hemoglobin: 12.1 g/dL — ABNORMAL LOW (ref 13.0–17.0)
MCH: 30 pg (ref 26.0–34.0)
MCHC: 32 g/dL (ref 30.0–36.0)
MCV: 93.6 fL (ref 78.0–100.0)
PLATELETS: 197 10*3/uL (ref 150–400)
RBC: 4.04 MIL/uL — ABNORMAL LOW (ref 4.22–5.81)
RDW: 16.9 % — ABNORMAL HIGH (ref 11.5–15.5)
WBC: 6.9 10*3/uL (ref 4.0–10.5)

## 2014-12-24 LAB — PROTIME-INR
INR: 3.89 — AB (ref 0.00–1.49)
Prothrombin Time: 37.2 seconds — ABNORMAL HIGH (ref 11.6–15.2)

## 2014-12-24 LAB — ABO/RH: ABO/RH(D): O POS

## 2014-12-24 MED ORDER — VITAMIN K1 10 MG/ML IJ SOLN
10.0000 mg | Freq: Once | INTRAVENOUS | Status: AC
  Administered 2014-12-24: 10 mg via INTRAVENOUS
  Filled 2014-12-24: qty 1

## 2014-12-24 MED ORDER — PANTOPRAZOLE SODIUM 40 MG PO TBEC
40.0000 mg | DELAYED_RELEASE_TABLET | Freq: Every day | ORAL | Status: DC
Start: 2014-12-24 — End: 2014-12-25

## 2014-12-24 MED ORDER — HYDRALAZINE HCL 20 MG/ML IJ SOLN: 5.0000 mg | INTRAMUSCULAR | Status: DC | PRN

## 2014-12-24 MED ORDER — MORPHINE SULFATE 2 MG/ML IJ SOLN: 2.0000 mg | INTRAMUSCULAR | Status: DC | PRN

## 2014-12-24 MED ORDER — MIDODRINE HCL 5 MG PO TABS
10.0000 mg | ORAL_TABLET | Freq: Once | ORAL | Status: AC
  Administered 2014-12-24: 10 mg via ORAL
  Filled 2014-12-24: qty 2

## 2014-12-24 MED ORDER — SODIUM CHLORIDE 0.9 % IV BOLUS (SEPSIS)
250.0000 mL | Freq: Once | INTRAVENOUS | Status: AC
  Administered 2014-12-24: 250 mL via INTRAVENOUS

## 2014-12-24 MED ORDER — DEXTROSE 5 % IV SOLN
1.5000 g | INTRAVENOUS | Status: AC
  Administered 2014-12-25: 1.5 g via INTRAVENOUS
  Filled 2014-12-24 (×2): qty 1.5

## 2014-12-24 MED ORDER — ONDANSETRON HCL 4 MG/2ML IJ SOLN: 4.0000 mg | Freq: Four times a day (QID) | INTRAMUSCULAR | Status: DC | PRN

## 2014-12-24 MED ORDER — SODIUM CHLORIDE 0.9 % IJ SOLN
3.0000 mL | Freq: Two times a day (BID) | INTRAMUSCULAR | Status: DC
  Administered 2014-12-25: 3 mL via INTRAVENOUS

## 2014-12-24 MED ORDER — VITAMIN K1 10 MG/ML IJ SOLN
10.0000 mg | Freq: Once | INTRAMUSCULAR | Status: AC
  Administered 2014-12-25: 10 mg via INTRAVENOUS
  Filled 2014-12-24 (×2): qty 1

## 2014-12-24 MED ORDER — METOPROLOL TARTRATE 1 MG/ML IV SOLN: 2.0000 mg | INTRAVENOUS | Status: DC | PRN

## 2014-12-24 MED ORDER — SODIUM CHLORIDE 0.9 % IV SOLN: Freq: Once | INTRAVENOUS | Status: DC

## 2014-12-24 MED ORDER — DOCUSATE SODIUM 100 MG PO CAPS
100.0000 mg | ORAL_CAPSULE | Freq: Two times a day (BID) | ORAL | Status: DC
  Filled 2014-12-24 (×2): qty 1

## 2014-12-24 MED ORDER — LABETALOL HCL 5 MG/ML IV SOLN
10.0000 mg | INTRAVENOUS | Status: DC | PRN
  Filled 2014-12-24: qty 4

## 2014-12-24 MED ORDER — ACETAMINOPHEN 325 MG PO TABS: 325.0000 mg | ORAL_TABLET | ORAL | Status: DC | PRN

## 2014-12-24 MED ORDER — SODIUM CHLORIDE 0.9 % IJ SOLN: 3.0000 mL | INTRAMUSCULAR | Status: DC | PRN

## 2014-12-24 MED ORDER — SODIUM CHLORIDE 0.9 % IV SOLN
Freq: Once | INTRAVENOUS | Status: AC
  Administered 2014-12-24: 22:00:00 via INTRAVENOUS

## 2014-12-24 MED ORDER — SODIUM CHLORIDE 0.9 % IV SOLN
250.0000 mL | INTRAVENOUS | Status: DC | PRN
  Administered 2014-12-25 (×2): via INTRAVENOUS

## 2014-12-24 MED ORDER — ACETAMINOPHEN 325 MG RE SUPP
325.0000 mg | RECTAL | Status: DC | PRN
  Filled 2014-12-24: qty 2

## 2014-12-24 NOTE — H&P (Signed)
[facility] [place] HISTORY AND PHYSICAL   History of Present Illness:  Patient is a 54 y.o. year old male who presents for evaluation of bleeding from left arm AV fistula. Fistula was placed in [place].  Pt recently moved here.  It has been revised once elsewhere.  He sometimes has prolonged bleeding but this was worse.  Controlled with pressure dressing in ER today.   Other medical problems include atrial fibrillation (on coumadin INR greater than 3 today), AICD, MWF dialysis, pulmonary hypertension  All of which are currently stable.  Past Medical History  Diagnosis Date  . Atrial fibrillation   . Nonischemic cardiomyopathy   . ICD (implantable cardioverter-defibrillator) in place     Medtronic   . Renal failure     Dialysis  . HTN (hypertension)   . Thyroid cancer   . Pulmonary HTN   . Great toe amputation status     status post left hallux amputation 08/01/14    Past Surgical History  Procedure Laterality Date  . Thyroidectomy    . Dialysis fistula creation    . Kidney transplant    . Laparoscopic gastric banding    . Toe amputations    . Hernia repair      Social History Social History  Substance Use Topics  . Smoking status: Never Smoker   . Smokeless tobacco: Never Used  . Alcohol Use: No    Family History Family History  Problem Relation Age of Onset  . Hypertension Father   . Heart failure Mother   . Kidney failure Father   . Hypertension Mother   . CAD Mother 52  . Emphysema Mother     Allergies  Allergies  Allergen Reactions  . Ivp Dye [Iodinated Diagnostic Agents] Nausea And Vomiting  . Vasotec [Enalapril] Swelling     Current Facility-Administered Medications  Medication Dose Route Frequency Provider Last Rate Last Dose  . 0.9 %  sodium chloride infusion  250 mL Intravenous PRN Elam Dutch, MD      . acetaminophen (TYLENOL) tablet 325-650 mg  325-650 mg Oral Q4H PRN Elam Dutch, MD       Or  . acetaminophen  (TYLENOL) suppository 325-650 mg  325-650 mg Rectal Q4H PRN Elam Dutch, MD      . Derrill Memo ON 12/25/2014] cefUROXime (ZINACEF) 1.5 g in dextrose 5 % 50 mL IVPB  1.5 g Intravenous Once Elam Dutch, MD      . docusate sodium (COLACE) capsule 100 mg  100 mg Oral BID Elam Dutch, MD      . hydrALAZINE (APRESOLINE) injection 5 mg  5 mg Intravenous Q20 Min PRN Elam Dutch, MD      . labetalol (NORMODYNE,TRANDATE) injection 10 mg  10 mg Intravenous Q10 min PRN Elam Dutch, MD      . metoprolol (LOPRESSOR) injection 2-5 mg  2-5 mg Intravenous Q2H PRN Elam Dutch, MD      . morphine 2 MG/ML injection 2-5 mg  2-5 mg Intravenous Q1H PRN Elam Dutch, MD      . ondansetron [facility]) injection 4 mg  4 mg Intravenous Q6H PRN Elam Dutch, MD      . pantoprazole (PROTONIX) EC tablet 40 mg  40 mg Oral Daily Elam Dutch, MD      . phytonadione (VITAMIN K) 10 mg in dextrose 5 % 50 mL IVPB  10 mg Intravenous Once Elam Dutch, MD      .  phytonadione (VITAMIN K) 10 mg in dextrose 5 % 50 mL IVPB  10 mg Intravenous Once Elam Dutch, MD      . sodium chloride 0.9 % injection 3 mL  3 mL Intravenous Q12H Elam Dutch, MD      . sodium chloride 0.9 % injection 3 mL  3 mL Intravenous PRN Elam Dutch, MD       Current Outpatient Prescriptions  Medication Sig Dispense Refill  . ambrisentan (LETAIRIS) 10 MG tablet Take 1 tablet (10 mg total) by mouth daily. 30 tablet 2  . amiodarone (PACERONE) 200 MG tablet Take 200 mg by mouth daily.    . calcium acetate (PHOSLO) 667 MG capsule Take 1 capsule by mouth 3 (three) times daily before meals.  6  . cinacalcet (SENSIPAR) 90 MG tablet Take 90 mg by mouth daily.    . clindamycin (CLEOCIN) 300 MG capsule Take 1 capsule (300 mg total) by mouth 3 (three) times daily. 30 capsule 2  . HYDROcodone-acetaminophen (NORCO/VICODIN) 5-325 MG per tablet Take 1-2 tablets by mouth every 4 (four) hours as needed for moderate pain.    Marland Kitchen  levothyroxine (SYNTHROID, LEVOTHROID) 112 MCG tablet Take 112 mcg by mouth 2 (two) times daily.    . midodrine (PROAMATINE) 10 MG tablet Take 10 mg by mouth 3 (three) times daily.    . naproxen sodium (ANAPROX) 220 MG tablet Take 220 mg by mouth 2 (two) times daily as needed (FOR PAIN).    Marland Kitchen silver sulfADIAZINE (SILVADENE) 1 % cream Apply 1 application topically daily. (Patient taking differently: Apply 1 application topically daily as needed (FOR WOUND). ) 50 g 0  . Tadalafil, PAH, (ADCIRCA) 20 MG TABS Take 20 mg by mouth 2 (two) times daily.    Marland Kitchen warfarin (COUMADIN) 6 MG tablet Take 1 to 1.5 tablets by mouth daily as directed by coumadin clinic (Patient taking differently: Take 6 mg by mouth daily at 6 PM. ) 50 tablet 3    ROS:   General:  No weight loss, Fever, chills  HEENT: No recent headaches, no nasal bleeding, no visual changes, no sore throat  Neurologic: No dizziness, blackouts, seizures. No recent symptoms of stroke or mini- stroke. No recent episodes of slurred speech, or temporary blindness.  Cardiac: No recent episodes of chest pain/pressure, no shortness of breath at rest.  +shortness of breath with exertion.  Denies history of atrial fibrillation or irregular heartbeat  Vascular: No history of rest pain in feet.  No history of claudication.  + history of non-healing ulcer, No history of DVT   Pulmonary: No home oxygen, no productive cough, no hemoptysis,  No asthma or wheezing  Musculoskeletal:  [ ]  Arthritis, [ ]  Low back pain,  [ ]  Joint pain  Hematologic:No history of hypercoagulable state.  No history of easy bleeding.  No history of anemia  Gastrointestinal: No hematochezia or melena,  No gastroesophageal reflux, no trouble swallowing  Urinary: [ ]  chronic Kidney disease, [x ] on HD - [x ] MWF or [ ]  TTHS, [ ]  Burning with urination, [ ]  Frequent urination, [ ]  Difficulty urinating;   Skin: No rashes  Psychological: No history of anxiety,  No history of  depression   Physical Examination  Filed Vitals:   12/24/14 1545 12/24/14 1600 12/24/14 1615 12/24/14 1655  BP: 94/60 94/60 94/61  90/54  Pulse: 67 66 65 67  Temp:      TempSrc:      Resp:   20  20  SpO2: 96% 100% 96% 98%    There is no weight on file to calculate BMI.  General:  Alert and oriented, no acute distress HEENT: Normal Neck: No bruit or JVD Pulmonary: Clear to auscultation bilaterally Cardiac: Regular Rate and Rhythm Abdomen: Soft, non-tender, non-distended, no mass, obese Skin: No rash, 2 cm ulceration over left upper arm AVF with slow active oozing.  ACE wrap placed over this for hemostasis Extremity Pulses:  2+ brachial Musculoskeletal: Multiple toe amps including one several days ago  Neurologic: Upper and lower extremity motor 5/5 and symmetric  DATA:    CBC    Component Value Date/Time   WBC 6.9 12/24/2014 1203   RBC 4.04* 12/24/2014 1203   HGB 12.1* 12/24/2014 1203   HCT 37.8* 12/24/2014 1203   PLT 197 12/24/2014 1203   MCV 93.6 12/24/2014 1203   MCH 30.0 12/24/2014 1203   MCHC 32.0 12/24/2014 1203   RDW 16.9* 12/24/2014 1203   INR 3.89  BMET    Component Value Date/Time   NA 134* 12/24/2014 1203   K 4.4 12/24/2014 1203   CL 92* 12/24/2014 1203   CO2 22 12/24/2014 1203   GLUCOSE 77 12/24/2014 1203   BUN 31* 12/24/2014 1203   CREATININE 7.71* 12/24/2014 1203   CREATININE 4.80* 07/27/2014 1539   CALCIUM 7.5* 12/24/2014 1203   GFRNONAA 7* 12/24/2014 1203   GFRNONAA 13* 07/27/2014 1539   GFRAA 8* 12/24/2014 1203   GFRAA 15* 07/27/2014 1539   ASSESSMENT:  Bleeding AVF with ulceration need revision   PLAN:  1.  Vitamin K to reverse coumadin if INR still elevated in am will give FFP.  2.  NPO p midnight for fistula revision in am  3. Consent  4.  Most likely d/c home tomorrow post op  5.  Sample to blood bank and set up 2 units of FFP in case needed tomorrow.  Procedure risk benefit possible FFP infusion discussed with pt and he wishes to  proceed.  [name], MD [facility] [place] Office: [phone] Pager: [phone]

## 2014-12-24 NOTE — Progress Notes (Signed)
Called to see pt for bleeding from fistula Direct pressure held above and below area of bleeding for 30 min No further bleeding.  Most likely fistula is occluded at this point Will infuse 2 units of FFP so definitive treatment can be performed tonight if rebleeds.  [name], MD [facility] [place] Office: [phone] Pager: [phone]

## 2014-12-24 NOTE — ED Notes (Signed)
Per EMS; pt from home with bleeding dialysis graft to left arm; pt sts last dialysis was Friday; pt had min blood loss per EMS; bleeding controlled with direct pressure upon arrival; pt hypotensive per norm at present; pt denies dizziness or other complaint

## 2014-12-24 NOTE — ED Notes (Signed)
Called and verified BP with nurse. Patient alert and oriented x4 with no change in mental status.

## 2014-12-24 NOTE — ED Provider Notes (Addendum)
CSN: [id number]     Arrival date & time 12/24/14  1158 History   First MD Initiated Contact with Patient 12/24/14 1214     Chief Complaint  Patient presents with  . Vascular Access Problem     (Consider location/radiation/quality/duration/timing/severity/associated sxs/prior Treatment) The history is provided by the patient and the EMS personnel.  Patient w hx esrd on HD, c/o bleeding from left upper arm fistula onset approximately 1 hr ago. States he had pulled his prior dressing/bandage off, when onset bleeding. Improved quickly w direct pressure. Pt and ems deny any heavy blood loss at scene. Pt is on coumadin, no other recent abn bleeding or bruising. No recent problems w fistula. Last had dialysis 2 days ago, normal/full run. Pt states felt fine earlier today, no sob, no weakness.       Past Medical History  Diagnosis Date  . Atrial fibrillation   . Nonischemic cardiomyopathy   . ICD (implantable cardioverter-defibrillator) in place     Medtronic   . Renal failure     Dialysis  . HTN (hypertension)   . Thyroid cancer   . Pulmonary HTN   . Great toe amputation status     status post left hallux amputation 08/01/14   Past Surgical History  Procedure Laterality Date  . Thyroidectomy    . Dialysis fistula creation    . Kidney transplant    . Laparoscopic gastric banding    . Toe amputations    . Hernia repair     Family History  Problem Relation Age of Onset  . Hypertension Father   . Heart failure Mother   . Kidney failure Father   . Hypertension Mother   . CAD Mother 53  . Emphysema Mother    Social History  Substance Use Topics  . Smoking status: Never Smoker   . Smokeless tobacco: Never Used  . Alcohol Use: No    Review of Systems  Constitutional: Negative for fever and chills.  HENT: Negative for nosebleeds and sore throat.   Eyes: Negative for redness.  Respiratory: Negative for shortness of breath.   Cardiovascular: Negative for chest pain.   Gastrointestinal: Negative for vomiting, abdominal pain and blood in stool.  Genitourinary: Negative for hematuria and flank pain.  Musculoskeletal: Negative for back pain and neck pain.  Skin: Negative for rash.  Neurological: Negative for syncope and headaches.  Hematological: Does not bruise/bleed easily.  Psychiatric/Behavioral: Negative for confusion.      Allergies  Ivp dye and Vasotec  Home Medications   Prior to Admission medications   Medication Sig Start Date End Date Taking? Authorizing Provider  ambrisentan (LETAIRIS) 10 MG tablet Take 1 tablet (10 mg total) by mouth daily. 11/09/14   Juanito Doom, MD  amiodarone (PACERONE) 200 MG tablet Take 200 mg by mouth daily.    Historical Provider, MD  calcium acetate (PHOSLO) 667 MG capsule Take 1 capsule by mouth 3 (three) times daily before meals. 06/26/14   Historical Provider, MD  cinacalcet (SENSIPAR) 90 MG tablet Take 90 mg by mouth daily.    Historical Provider, MD  clindamycin (CLEOCIN) 300 MG capsule Take 1 capsule (300 mg total) by mouth 3 (three) times daily. 12/19/14   Trula Slade, DPM  HYDROcodone-acetaminophen (NORCO/VICODIN) 5-325 MG per tablet Take 1-2 tablets by mouth every 4 (four) hours as needed for moderate pain.    Trula Slade, DPM  levothyroxine (SYNTHROID, LEVOTHROID) 112 MCG tablet Take 112 mcg by mouth 2 (two) times  daily.    Historical Provider, MD  midodrine (PROAMATINE) 10 MG tablet Take 10 mg by mouth 3 (three) times daily.    Historical Provider, MD  promethazine (PHENERGAN) 12.5 MG tablet Take 12.5 mg by mouth every 8 (eight) hours as needed for nausea or vomiting.    Trula Slade, DPM  silver sulfADIAZINE (SILVADENE) 1 % cream Apply 1 application topically daily. 09/01/14   Trula Slade, DPM  Tadalafil, PAH, (ADCIRCA) 20 MG TABS Take 20 mg by mouth 2 (two) times daily.    Historical Provider, MD  warfarin (COUMADIN) 6 MG tablet Take 1 to 1.5 tablets by mouth daily as directed by  coumadin clinic 09/26/14   Minus Breeding, MD   BP 86/40 mmHg  Pulse 91  Temp(Src) 97.6 F (36.4 C) (Oral)  Resp 20  SpO2 97% Physical Exam  Constitutional: He appears well-developed and well-nourished. No distress.  HENT:  Mouth/Throat: Oropharynx is clear and moist.  Eyes: Conjunctivae are normal.  Neck: Neck supple. No tracheal deviation present.  Cardiovascular: Normal rate, normal heart sounds and intact distal pulses.  Exam reveals no gallop and no friction rub.   No murmur heard. Pulmonary/Chest: Effort normal and breath sounds normal. No accessory muscle usage. No respiratory distress.  Abdominal: Soft. Bowel sounds are normal. He exhibits no distension. There is no tenderness.  Musculoskeletal: Normal range of motion. He exhibits no edema or tenderness.  Left upper ext fistula w palp thrill. No active bleeding. Radial pulse palp bil.   Neurological: He is alert.  Skin: Skin is warm and dry. He is not diaphoretic.  Psychiatric: He has a normal mood and affect.  Nursing note and vitals reviewed.   ED Course  Procedures (including critical care time) Labs Review  Results for orders placed or performed during the hospital encounter of 12/24/14  Protime-INR  Result Value Ref Range   Prothrombin Time 37.2 (H) 11.6 - 15.2 seconds   INR 3.89 (H) 0.00 - 1.49  CBC  Result Value Ref Range   WBC 6.9 4.0 - 10.5 K/uL   RBC 4.04 (L) 4.22 - 5.81 MIL/uL   Hemoglobin 12.1 (L) 13.0 - 17.0 g/dL   HCT 37.8 (L) 39.0 - 52.0 %   MCV 93.6 78.0 - 100.0 fL   MCH 30.0 26.0 - 34.0 pg   MCHC 32.0 30.0 - 36.0 g/dL   RDW 16.9 (H) 11.5 - 15.5 %   Platelets 197 150 - 400 K/uL  Basic metabolic panel  Result Value Ref Range   Sodium 134 (L) 135 - 145 mmol/L   Potassium 4.4 3.5 - 5.1 mmol/L   Chloride 92 (L) 101 - 111 mmol/L   CO2 22 22 - 32 mmol/L   Glucose, Bld 77 65 - 99 mg/dL   BUN 31 (H) 6 - 20 mg/dL   Creatinine, Ser 7.71 (H) 0.61 - 1.24 mg/dL   Calcium 7.5 (L) 8.9 - 10.3 mg/dL   GFR  calc non Af Amer 7 (L) >60 mL/min   GFR calc Af Amer 8 (L) >60 mL/min   Anion gap 20 (H) 5 - 15       MDM   Pressure dressing.  No active bleeding.  bp low, 250 cc ns bolus.  Pt indicates he hasnt taken any of his meds today, including midodrine - given dose in ED.  Pt denies any faintness or dizziness, states feels fine.  Pt notes his recent baseline bp is in upper 80s, low 90s.   Recheck,  pressure dressing removed.   Bleeding recurs/persists, despite point-pressure.  Snug pressure dressing re-applied.  Recheck distal pulse palp.  Given recurrent/persistent bleeding, will discuss w vascular - the bleeding needle stick site appears to be in small/superficial ulcerated area ?whether suture vs other tx.   Discussed with Dr [name], vascular on call - he will see in ED.      Lajean Saver, MD 12/24/14 [phone]

## 2014-12-24 NOTE — ED Notes (Signed)
Patient complained of numbness on the left arm. MD made aware. At the bedside.

## 2014-12-24 NOTE — ED Notes (Signed)
Lanny Hurst, NT trying to get EKG.

## 2014-12-25 ENCOUNTER — Encounter (HOSPITAL_COMMUNITY)
Admission: EM | Disposition: A | Discharge status: Home / Self Care | Payer: Self-pay | Source: Home / Self Care | Attending: Vascular Surgery

## 2014-12-25 ENCOUNTER — Inpatient Hospital Stay (HOSPITAL_COMMUNITY): Payer: Medicare Other | Admitting: Anesthesiology

## 2014-12-25 ENCOUNTER — Encounter (HOSPITAL_COMMUNITY): Payer: Self-pay | Admitting: Anesthesiology

## 2014-12-25 HISTORY — PX: REVISON OF ARTERIOVENOUS FISTULA: SHX6074

## 2014-12-25 HISTORY — PX: THROMBECTOMY W/ EMBOLECTOMY: SHX2507

## 2014-12-25 LAB — CBC
HCT: 29.8 % — ABNORMAL LOW (ref 39.0–52.0)
HEMOGLOBIN: 9.7 g/dL — AB (ref 13.0–17.0)
MCH: 30.2 pg (ref 26.0–34.0)
MCHC: 32.6 g/dL (ref 30.0–36.0)
MCV: 92.8 fL (ref 78.0–100.0)
Platelets: 143 10*3/uL — ABNORMAL LOW (ref 150–400)
RBC: 3.21 MIL/uL — ABNORMAL LOW (ref 4.22–5.81)
RDW: 16.8 % — ABNORMAL HIGH (ref 11.5–15.5)
WBC: 5.5 10*3/uL (ref 4.0–10.5)

## 2014-12-25 LAB — PREPARE FRESH FROZEN PLASMA
UNIT DIVISION: 0
UNIT DIVISION: 0

## 2014-12-25 LAB — PROTIME-INR
INR: 1.53 — ABNORMAL HIGH (ref 0.00–1.49)
PROTHROMBIN TIME: 18.5 s — AB (ref 11.6–15.2)

## 2014-12-25 LAB — BASIC METABOLIC PANEL
Anion gap: 12 (ref 5–15)
BUN: 37 mg/dL — AB (ref 6–20)
CHLORIDE: 96 mmol/L — AB (ref 101–111)
CO2: 26 mmol/L (ref 22–32)
CREATININE: 8.71 mg/dL — AB (ref 0.61–1.24)
Calcium: 7.1 mg/dL — ABNORMAL LOW (ref 8.9–10.3)
GFR calc Af Amer: 7 mL/min — ABNORMAL LOW (ref >60)
GFR calc non Af Amer: 6 mL/min — ABNORMAL LOW (ref >60)
GLUCOSE: 70 mg/dL (ref 65–99)
Potassium: 3.7 mmol/L (ref 3.5–5.1)
SODIUM: 134 mmol/L — AB (ref 135–145)

## 2014-12-25 LAB — GLUCOSE, CAPILLARY: Glucose-Capillary: 85 mg/dL (ref 65–99)

## 2014-12-25 LAB — SURGICAL PCR SCREEN
MRSA, PCR: NEGATIVE
Staphylococcus aureus: POSITIVE — AB

## 2014-12-25 SURGERY — REVISON OF ARTERIOVENOUS FISTULA
Anesthesia: General | Site: Arm Upper | Laterality: Left

## 2014-12-25 MED ORDER — 0.9 % SODIUM CHLORIDE (POUR BTL) OPTIME
TOPICAL | Status: DC | PRN
  Administered 2014-12-25: 1000 mL

## 2014-12-25 MED ORDER — PROTAMINE SULFATE 10 MG/ML IV SOLN
INTRAVENOUS | Status: AC
  Filled 2014-12-25: qty 5

## 2014-12-25 MED ORDER — PHENYLEPHRINE HCL 10 MG/ML IJ SOLN: 0.0000 ug/min | INTRAMUSCULAR | Status: DC

## 2014-12-25 MED ORDER — HYDROCODONE-ACETAMINOPHEN 5-325 MG PO TABS: 1.0000 | ORAL_TABLET | ORAL | Status: DC | PRN

## 2014-12-25 MED ORDER — WARFARIN SODIUM 6 MG PO TABS: ORAL_TABLET | ORAL | Status: DC

## 2014-12-25 MED ORDER — HEPARIN SODIUM (PORCINE) 1000 UNIT/ML IJ SOLN
INTRAMUSCULAR | Status: DC | PRN
  Administered 2014-12-25: 5000 [IU] via INTRAVENOUS

## 2014-12-25 MED ORDER — PROMETHAZINE HCL 25 MG/ML IJ SOLN
6.2500 mg | INTRAMUSCULAR | Status: DC | PRN
Start: 2014-12-25 — End: 2014-12-25

## 2014-12-25 MED ORDER — FENTANYL CITRATE (PF) 100 MCG/2ML IJ SOLN
INTRAMUSCULAR | Status: DC | PRN
  Administered 2014-12-25 (×2): 25 ug via INTRAVENOUS
  Administered 2014-12-25: 100 ug via INTRAVENOUS

## 2014-12-25 MED ORDER — LIDOCAINE HCL (CARDIAC) 20 MG/ML IV SOLN
INTRAVENOUS | Status: AC
  Filled 2014-12-25: qty 5

## 2014-12-25 MED ORDER — THROMBIN 20000 UNITS EX SOLR
CUTANEOUS | Status: AC
  Filled 2014-12-25: qty 20000

## 2014-12-25 MED ORDER — ONDANSETRON HCL 4 MG/2ML IJ SOLN
INTRAMUSCULAR | Status: AC
  Filled 2014-12-25: qty 2

## 2014-12-25 MED ORDER — LIDOCAINE HCL (CARDIAC) 20 MG/ML IV SOLN
INTRAVENOUS | Status: DC | PRN
  Administered 2014-12-25: 80 mg via INTRAVENOUS

## 2014-12-25 MED ORDER — SODIUM CHLORIDE 0.9 % IV SOLN
10.0000 mg | INTRAVENOUS | Status: DC | PRN
  Administered 2014-12-25: 10 ug/min via INTRAVENOUS

## 2014-12-25 MED ORDER — PROPOFOL 10 MG/ML IV BOLUS
INTRAVENOUS | Status: AC
  Filled 2014-12-25: qty 20

## 2014-12-25 MED ORDER — PHENYLEPHRINE HCL 10 MG/ML IJ SOLN
INTRAMUSCULAR | Status: AC
  Filled 2014-12-25: qty 2

## 2014-12-25 MED ORDER — ETOMIDATE 2 MG/ML IV SOLN
INTRAVENOUS | Status: DC | PRN
  Administered 2014-12-25: 16 mg via INTRAVENOUS

## 2014-12-25 MED ORDER — HEPARIN SODIUM (PORCINE) 1000 UNIT/ML IJ SOLN
INTRAMUSCULAR | Status: AC
  Filled 2014-12-25: qty 3

## 2014-12-25 MED ORDER — SODIUM CHLORIDE 0.9 % IR SOLN
Status: DC | PRN
  Administered 2014-12-25: 500 mL

## 2014-12-25 MED ORDER — MIDAZOLAM HCL 5 MG/5ML IJ SOLN
INTRAMUSCULAR | Status: DC | PRN
  Administered 2014-12-25: 1 mg via INTRAVENOUS

## 2014-12-25 MED ORDER — FENTANYL CITRATE (PF) 250 MCG/5ML IJ SOLN
INTRAMUSCULAR | Status: AC
  Filled 2014-12-25: qty 5

## 2014-12-25 MED ORDER — MIDAZOLAM HCL 2 MG/2ML IJ SOLN
INTRAMUSCULAR | Status: AC
  Filled 2014-12-25: qty 2

## 2014-12-25 MED ORDER — HYDROMORPHONE HCL 1 MG/ML IJ SOLN: 0.2500 mg | INTRAMUSCULAR | Status: DC | PRN

## 2014-12-25 MED ORDER — PROPOFOL 10 MG/ML IV BOLUS
INTRAVENOUS | Status: DC | PRN
  Administered 2014-12-25: 50 mg via INTRAVENOUS
  Administered 2014-12-25: 30 mg via INTRAVENOUS

## 2014-12-25 SURGICAL SUPPLY — 34 items
CANISTER SUCTION 2500CC (MISCELLANEOUS) ×2 IMPLANT
CANNULA VESSEL 3MM 2 BLNT TIP (CANNULA) IMPLANT
CATH EMB 3FR 80CM (CATHETERS) ×2 IMPLANT
CATH EMB 4FR 80CM (CATHETERS) ×4 IMPLANT
CLIP TI MEDIUM 6 (CLIP) ×2 IMPLANT
CLIP TI WIDE RED SMALL 6 (CLIP) ×2 IMPLANT
CORDS BIPOLAR (ELECTRODE) ×2 IMPLANT
COVER PROBE W GEL 5X96 (DRAPES) ×2 IMPLANT
DECANTER SPIKE VIAL GLASS SM (MISCELLANEOUS) ×2 IMPLANT
DRAIN PENROSE 1/4X12 LTX STRL (WOUND CARE) ×2 IMPLANT
ELECT REM PT RETURN 9FT ADLT (ELECTROSURGICAL) ×2
ELECTRODE REM PT RTRN 9FT ADLT (ELECTROSURGICAL) ×1 IMPLANT
GEL ULTRASOUND 20GR AQUASONIC (MISCELLANEOUS) IMPLANT
GLOVE BIO SURGEON STRL SZ7.5 (GLOVE) ×4 IMPLANT
GLOVE BIOGEL PI IND STRL 7.0 (GLOVE) ×1 IMPLANT
GLOVE BIOGEL PI INDICATOR 7.0 (GLOVE) ×1
GLOVE SURG SS PI 7.0 STRL IVOR (GLOVE) ×4 IMPLANT
GOWN STRL REUS W/ TWL LRG LVL3 (GOWN DISPOSABLE) ×3 IMPLANT
GOWN STRL REUS W/TWL LRG LVL3 (GOWN DISPOSABLE) ×3
KIT BASIN OR (CUSTOM PROCEDURE TRAY) ×2 IMPLANT
KIT ROOM TURNOVER OR (KITS) ×2 IMPLANT
LIQUID BAND (GAUZE/BANDAGES/DRESSINGS) ×2 IMPLANT
LOOP VESSEL MINI RED (MISCELLANEOUS) IMPLANT
NS IRRIG 1000ML POUR BTL (IV SOLUTION) ×2 IMPLANT
PACK CV ACCESS (CUSTOM PROCEDURE TRAY) ×2 IMPLANT
PAD ARMBOARD 7.5X6 YLW CONV (MISCELLANEOUS) ×4 IMPLANT
SPONGE SURGIFOAM ABS GEL 100 (HEMOSTASIS) IMPLANT
SUT PROLENE 5 0 C 1 24 (SUTURE) ×2 IMPLANT
SUT PROLENE 7 0 BV 1 (SUTURE) ×2 IMPLANT
SUT VIC AB 3-0 SH 27 (SUTURE) ×1
SUT VIC AB 3-0 SH 27X BRD (SUTURE) ×1 IMPLANT
SUT VICRYL 4-0 PS2 18IN ABS (SUTURE) ×2 IMPLANT
UNDERPAD 30X30 INCONTINENT (UNDERPADS AND DIAPERS) ×2 IMPLANT
WATER STERILE IRR 1000ML POUR (IV SOLUTION) ×2 IMPLANT

## 2014-12-25 NOTE — Progress Notes (Signed)
No further complaints of numbness in hand.

## 2014-12-25 NOTE — Transfer of Care (Signed)
Immediate Anesthesia Transfer of Care Note  Patient: Adam [name]  Procedure(s) Performed: Procedure(s): PLICATION OF ARTERIOVENOUS FISTULA ANEURYSM (Left) THROMBECTOMY ARTERIOVENOUS FISTULA (Left)  Patient Location: PACU  Anesthesia Type:General  Level of Consciousness: awake, alert  and oriented  Airway & Oxygen Therapy: Patient Spontanous Breathing and Patient connected to face mask oxygen  Post-op Assessment: Report given to RN, Post -op Vital signs reviewed and stable and Patient moving all extremities X 4  Post vital signs: Reviewed and stable  Last Vitals:  Filed Vitals:   12/25/14 1112  BP: 98/59  Pulse: 65  Temp:   Resp: 20    Complications: No apparent anesthesia complications

## 2014-12-25 NOTE — Progress Notes (Signed)
Spoke with Dr Orene Desanctis regarding patient low Blood pressures. 57/34 upon arrival, restarted Neo with CRNA at bedside-able to elevate pressures to 98/61-98/59. CRNA discontinued neo before leaving bedside- Dr Orene Desanctis notified of pressures ranging from systolic [id number]. Will reposition in bed per orders and start the lowest dose of neo possible to get a goal pressure of [id number] systolic as ordered by [place].

## 2014-12-25 NOTE — Progress Notes (Signed)
Paged Dr. [name] about the patient B/P.  Patient returned from the OR with a B/P of 56/31 and heart rate of 73.  The patient stated he felt lightheaded.  Dr. [name] ordered a 250 ml bolus of NS.  It was administered in 30 minutes and post B/P was 61/37 with HR of 77.  The B/P 30 minutes post administration 63/40  HR 73 O2 sat 99%.  The patient is now asymtomatic and is ready to be D/Ced.  I paged Dr. [name] again and he asked what his normal B/P ran I stated systolic 70 or [id number].  Dr. [name] said it was OK to D/C if the patient was not light headed.

## 2014-12-25 NOTE — Progress Notes (Signed)
Pt complains of L hand feeling cold and numb. No pulse palpated but pulse is heard on doppler. MD paged. Instructed to loosen wrap. Will continue to monitor closely.  Raliegh Ip RN

## 2014-12-25 NOTE — Progress Notes (Signed)
Dr Orene Desanctis called -discussed trends in BP- will discontinue Neo and send to floor for discharge home. Patient baseline ranges from Systolic Bp [id number] asymptomatic, and patient states he takes 10mg  PO midodrine three times daily . Will contact nephrologist if any issues regarding BP upon arrival to floor.

## 2014-12-25 NOTE — Progress Notes (Addendum)
MD paged regarding pt bleeding prefusely from fistula site. Pt asymptomatic. Rapid response at bedside. MD at bedside. New orders given, Will monitor closely.  Raliegh Ip RN

## 2014-12-25 NOTE — Anesthesia Procedure Notes (Signed)
Procedure Name: LMA Insertion Date/Time: 12/25/2014 9:30 AM Performed by: Kyung Rudd Pre-anesthesia Checklist: Patient identified, Emergency Drugs available, Suction available, Patient being monitored and Timeout performed Patient Re-evaluated:Patient Re-evaluated prior to inductionOxygen Delivery Method: Circle system utilized Preoxygenation: Pre-oxygenation with 100% oxygen Intubation Type: IV induction Ventilation: Mask ventilation without difficulty LMA: LMA inserted LMA Size: 5.0 Number of attempts: 1 Placement Confirmation: positive ETCO2 and breath sounds checked- equal and bilateral Tube secured with: Tape Dental Injury: Teeth and Oropharynx as per pre-operative assessment

## 2014-12-25 NOTE — Op Note (Signed)
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 The fistula was placed elsewhere. Apparently has had one revision although the patient did not know the details regarding this. The patient is on chronic Coumadin for atrial fibrillation. He was admitted last evening and given fresh frozen plasma to reverse his Coumadin. He did have an episode of bleeding last night and the fistula clotted with direct pressure.  Operative details: After obtaining informed consent, the patient was taken the operating room. The patient was placed in supine position on the operating table. After induction general anesthesia and endotracheal intubation, the patient's entire left upper extremity was prepped and draped in usual sterile fashion. Next the left arm was inspected and an elliptical incision was made around a 1 cm ulceration in the mid upper arm. The elliptical incision was made incorporating the entire ulcerated area of skin. Incision was carried down through the subcutaneous tissues down to level of fistula. It was aneurysmal in this location. The aneurysm was approximate 3-4 cm in diameter. I dissected the aneurysm free circumferentially and a portion of the fistula above and below the aneurysm that was of more normal caliber. The patient was given 5000 units of intravenous heparin. The fistula was clamped above and below the area of aneurysm. I then opened the aneurysm through the opening in the skin. There is a large amount of thrombus within this. This was removed under direct  vision. I then used a #4 Fogarty catheter to thrombectomize the venous limb of the fistula. There was excellent venous backbleeding. The fistula was thoroughly flushed with heparinized saline and a clamp placed at the distal end of the aneurysm. I then proceeded to thrombectomize the proximal end of the fistula with #3 and #4 Fogarty catheters. All thrombus was removed and there was good arterial inflow. This was again clamped proximally. Some redundancy of the fistula was then debrided away. There was evidence of previous suture line suggesting that this area may have been plicated before. All of this was to repeat away. The fistula was then reapproximated using a running 5-0 Prolene suture. Just prior completion of the anastomosis it was forebled backbled and thoroughly flushed.  Anastomosis was secured clamps released and there was a palpable thrill in the fistula immediately. There was good triphasic radial and ulnar Doppler flow. Hemostasis was obtained with direct pressure. The subcutaneous tissues were reapproximated using several interrupted 3-0 Vicryl sutures. The skin was then closed with a 4-0 Vicryl subcuticular stitch. Liquiband was applied to the incision. Patient tolerated the procedure well and there were no complications. The instrument sponge and needle count was correct at the end of the case. The patient was extubated in the operating room taken the recovery room in stable condition.  [name], MD [facility] Office: [phone] Pager: [phone]

## 2014-12-25 NOTE — Anesthesia Postprocedure Evaluation (Signed)
  Anesthesia Post-op Note  Patient: Adam [name]  Procedure(s) Performed: Procedure(s): PLICATION OF ARTERIOVENOUS FISTULA ANEURYSM (Left) THROMBECTOMY ARTERIOVENOUS FISTULA (Left)  Patient Location: PACU  Anesthesia Type:General  Level of Consciousness: awake and alert   Airway and Oxygen Therapy: Patient Spontanous Breathing  Post-op Pain: mild  Post-op Assessment: Post-op Vital signs reviewed              Post-op Vital Signs: stable  Last Vitals:  Filed Vitals:   12/25/14 1305  BP: 75/41  Pulse: 68  Temp:   Resp: 15    Complications: No apparent anesthesia complications

## 2014-12-25 NOTE — Progress Notes (Signed)
Adam [name] called to interrogate AICD around 0840 and she is by now to interrogate AICD.  Informed that Dr. Orene Desanctis would like to speak with her before she leaves.

## 2014-12-25 NOTE — Anesthesia Preprocedure Evaluation (Addendum)
Anesthesia Evaluation  Patient identified by MRN, date of birth, ID band Patient awake    Reviewed: Allergy & Precautions, NPO status , Patient's Chart, lab work & pertinent test results  History of Anesthesia Complications Negative for: history of anesthetic complications  Airway Mallampati: I  TM Distance: >3 FB Neck ROM: Full    Dental  (+) Missing, Edentulous Upper   Pulmonary neg pulmonary ROS,  breath sounds clear to auscultation        Cardiovascular hypertension, + dysrhythmias Atrial Fibrillation + Cardiac Defibrillator Rhythm:Irregular Rate:Normal  AICD, cardiomyopathy, pul HTN   Neuro/Psych negative neurological ROS     GI/Hepatic   Endo/Other  Morbid obesity  Renal/GU ESRFRenal disease     Musculoskeletal   Abdominal (+) + obese,   Peds  Hematology  (+) Blood dyscrasia, anemia ,   Anesthesia Other Findings   Reproductive/Obstetrics                           Anesthesia Physical Anesthesia Plan  ASA: IV  Anesthesia Plan: General   Post-op Pain Management:    Induction: Intravenous  Airway Management Planned: Oral ETT  Additional Equipment:   Intra-op Plan:   Post-operative Plan: Extubation in OR  Informed Consent: I have reviewed the patients History and Physical, chart, labs and discussed the procedure including the risks, benefits and alternatives for the proposed anesthesia with the patient or authorized representative who has indicated his/her understanding and acceptance.     Plan Discussed with: CRNA and Surgeon  Anesthesia Plan Comments:         Anesthesia Quick Evaluation

## 2014-12-25 NOTE — Interval H&P Note (Signed)
History and Physical Interval Note:  12/25/2014 7:26 AM  Adam [name]  has presented today for surgery, with the diagnosis of /  The various methods of treatment have been discussed with the patient and family. After consideration of risks, benefits and other options for treatment, the patient has consented to  Procedure(s): REVISON OF ARTERIOVENOUS FISTULA (Left) as a surgical intervention .  The patient's history has been reviewed, patient examined, no change in status, stable for surgery.  I have reviewed the patient's chart and labs.  Questions were answered to the patient's satisfaction.     [name]

## 2014-12-25 NOTE — Progress Notes (Signed)
Neo drip initiated at 20mcg/min at 1139. BP at 1142 96/60. Will titrate/ maintain neo until goal BP of systolic 80-85 met.  

## 2014-12-26 ENCOUNTER — Encounter (HOSPITAL_COMMUNITY): Payer: Self-pay | Admitting: Vascular Surgery

## 2014-12-27 NOTE — Discharge Summary (Signed)
[facility] Discharge Summary  Adam [name] Nov 30, 1960 54 y.o. male  [id number]  Admission Date: 12/24/2014  Discharge Date: 12/24/2013  Physician: [name], MD  Admission Diagnosis: Preop cardiovascular exam [Z01.810] End stage renal disease on dialysis [N18.6, Z99.2] Warfarin-induced coagulopathy [D69.9, T45.515A] Bleeding from dialysis shunt, initial encounter [T82.838A]  HPI:   This is a 54 y.o. male who presented for evaluation of bleeding from left arm AV fistula. Fistula was placed in [place]. Pt recently moved here. It has been revised once elsewhere. He sometimes has prolonged bleeding but this was worse. Controlled with pressure dressing in ER today. Other medical problems include atrial fibrillation (on coumadin INR greater than 3 today), AICD, MWF dialysis, pulmonary hypertension All of which are currently stable.   Hospital Course:  The patient was admitted to the hospital on 12/24/14 for coumadin reversal. He was taken to the OR the following day on 12/25/14 and underwent thrombectomy of left arm AV fistula with plication of fistula aneurysm.     The patient tolerated the procedure well and was transported to the PACU in good condition. He had some hypotension post-operatively and was given a 250 cc NS fluid bolus and then started briefly on neosynephrine. He was asymptomatic. He has a low BP at baseline and is on midodrine three times daily. This was titrated down and he was sent to the floor for discharge. He was discharged home in good condition and proceeded to outpatient dialysis.     CBC    Component Value Date/Time   WBC 5.5 12/25/2014 0550   RBC 3.21* 12/25/2014 0550   HGB 9.7* 12/25/2014 0550   HCT 29.8* 12/25/2014 0550   PLT 143* 12/25/2014 0550   MCV 92.8 12/25/2014 0550   MCH 30.2 12/25/2014 0550   MCHC 32.6 12/25/2014 0550   RDW 16.8* 12/25/2014 0550    BMET    Component Value Date/Time   NA 134* 12/25/2014 0550    K 3.7 12/25/2014 0550   CL 96* 12/25/2014 0550   CO2 26 12/25/2014 0550   GLUCOSE 70 12/25/2014 0550   BUN 37* 12/25/2014 0550   CREATININE 8.71* 12/25/2014 0550   CREATININE 4.80* 07/27/2014 1539   CALCIUM 7.1* 12/25/2014 0550   GFRNONAA 6* 12/25/2014 0550   GFRNONAA 13* 07/27/2014 1539   GFRAA 7* 12/25/2014 0550   GFRAA 15* 07/27/2014 1539     Discharge Instructions:   The patient is discharged to home with extensive instructions on wound care and progressive ambulation.  They are instructed not to drive or perform any heavy lifting until returning to see the physician in his office.  Discharge Instructions    Call MD for:  redness, tenderness, or signs of infection (pain, swelling, bleeding, redness, odor or green/yellow discharge around incision site)    Complete by:  As directed      Call MD for:  severe or increased pain, loss or decreased feeling  in affected limb(s)    Complete by:  As directed      Call MD for:  temperature >100.5    Complete by:  As directed      Discharge wound care:    Complete by:  As directed   Remove dressing tomorrow then wash wound daily with soap and water and pat dry.     Driving Restrictions    Complete by:  As directed   No driving while on pain medication     Increase activity slowly    Complete by:  As directed   Walk with assistance use walker or cane as needed     Lifting restrictions    Complete by:  As directed   No lifting for 1 week     Resume previous diet    Complete by:  As directed            Discharge Diagnosis:  Preop cardiovascular exam [Z01.810] End stage renal disease on dialysis [N18.6, Z99.2] Warfarin-induced coagulopathy [D69.9, T45.515A] Bleeding from dialysis shunt, initial encounter [id number]  Secondary Diagnosis: Patient Active Problem List   Diagnosis Date Noted  . 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 Pure hypercholesterolemia 11/09/2014  . History of  thyroid cancer 11/07/2014  . Obesity 11/07/2014  . Healthcare maintenance 11/07/2014  . Toe amputation status 10/13/2014  . ICD (implantable cardioverter-defibrillator) in place 07/25/2014  . ESRD (end stage renal disease) on hemodialysis 07/25/2014  . Hypotension (arterial) 07/25/2014  . Pulmonary hypertension 2020/03/2515  . Atrial fibrillation [I48.91] 05/24/2014  . Long-term (current) use of anticoagulants 05/24/2014   Past Medical History  Diagnosis Date  . Atrial fibrillation   . Nonischemic cardiomyopathy   . ICD (implantable cardioverter-defibrillator) in place     [employer]   . Renal failure     Dialysis  . HTN (hypertension)   . Thyroid cancer   . Pulmonary HTN   . Great toe amputation status     status post left hallux amputation 08/01/14       Medication List    TAKE these medications        ADCIRCA 20 MG Tabs  Generic drug:  Tadalafil (PAH)  Take 20 mg by mouth 2 (two) times daily.     ambrisentan 10 MG tablet  Commonly known as:  LETAIRIS  Take 1 tablet (10 mg total) by mouth daily.     amiodarone 200 MG tablet  Commonly known as:  PACERONE  Take 200 mg by mouth daily.     calcium acetate 667 MG capsule  Commonly known as:  PHOSLO  Take 1 capsule by mouth 3 (three) times daily before meals.     cinacalcet 90 MG tablet  Commonly known as:  SENSIPAR  Take 90 mg by mouth daily.     clindamycin 300 MG capsule  Commonly known as:  CLEOCIN  Take 1 capsule (300 mg total) by mouth 3 (three) times daily.     HYDROcodone-acetaminophen 5-325 MG per tablet  Commonly known as:  NORCO/VICODIN  Take 1-2 tablets by mouth every 4 (four) hours as needed for moderate pain.     levothyroxine 112 MCG tablet  Commonly known as:  SYNTHROID, LEVOTHROID  Take 112 mcg by mouth 2 (two) times daily.     midodrine 10 MG tablet  Commonly known as:  PROAMATINE  Take 10 mg by mouth 3 (three) times daily.     naproxen sodium 220 MG tablet  Commonly known as:  ANAPROX    Take 220 mg by mouth 2 (two) times daily as needed (FOR PAIN).     silver sulfADIAZINE 1 % cream  Commonly known as:  SILVADENE  Apply 1 application topically daily.     warfarin 6 MG tablet  Commonly known as:  COUMADIN  Take 1 to 1.5 tablets by mouth daily as directed by coumadin clinic You may resume coumadin tomorrow 12/26/14        Vicodin #30 No Refill  Disposition: Home  Patient's condition: is Good  Follow up: 1. Dr. [name]  in 2 weeks   Virgina Jock, [place] [facility] [phone] 12/27/2014  2:23 PM

## 2014-12-28 ENCOUNTER — Encounter: Payer: Self-pay | Admitting: Podiatry

## 2014-12-28 NOTE — Progress Notes (Signed)
Patient ID: Adam [name], male   DOB: Feb 16, 1961, 54 y.o.   MRN: [id number]  DOS: 12/20/14 s/p left 3rd toe amputation  Subjective: 54 year old male presents the office today 2 days' status post left third toe amputation. He presents today for drain removal. He states that he has continued antibiotic. Continue with surgical shoe. He denies any pain to the area at this time. He denies any systemic complaints as fevers, chills, nausea, vomiting. No calf pain, chest pain, shortness of breath. No other complaints at this time in no acute changes otherwise.  Objective: AAO 3, NAD DP/PT pulses decreased bilaterally Protective sensation decreased with Simms Weinstein monofilament Status post left third toe amputation. Sutures are intact without any evidence of dehiscence. Drain is intact. There is a slight macerated periwound. There is no swelling erythema, ascending cellulitis, fluctuance, crepitus, drainage/purulence, malodor. No other open lesions or pre-ulcerative lesions are identified bilaterally. There is no pain with calf compression, swelling, warmth, erythema.  Assessment: 54 year old male presents the office today 2 days' status post left third toe amputation. Presents for drain removal.  Plan: -X-rays were obtained and reviewed with the patient.  -Treatment options discussed including all alternatives, risks, and complications -Dressings were removed. The drain was removed in total. Patient was pain over the incision followed by dry sterile dressing. Keep dressing clean, dry, intact. -Finish course of antibiotics. -Continue surgical shoe. -Monitor for any clinical signs or symptoms of infection and directed to call the office immediately should any occur or go to the ER. -Follow-up 1 week or sooner if any problems arise. In the meantime, encouraged to call the office with any questions, concerns, change in symptoms.   Celesta Gentile, DPM

## 2014-12-29 ENCOUNTER — Ambulatory Visit (INDEPENDENT_AMBULATORY_CARE_PROVIDER_SITE_OTHER): Payer: Medicare Other

## 2014-12-29 ENCOUNTER — Ambulatory Visit (INDEPENDENT_AMBULATORY_CARE_PROVIDER_SITE_OTHER): Payer: Medicare Other | Admitting: Podiatry

## 2014-12-29 ENCOUNTER — Encounter: Payer: Self-pay | Admitting: Podiatry

## 2014-12-29 VITALS — BP 176/101 | HR 78 | Resp 12

## 2014-12-29 DIAGNOSIS — Z899 Acquired absence of limb, unspecified: Secondary | ICD-10-CM

## 2014-12-29 DIAGNOSIS — I739 Peripheral vascular disease, unspecified: Secondary | ICD-10-CM

## 2015-01-01 ENCOUNTER — Ambulatory Visit: Payer: Medicare Other | Admitting: Podiatry

## 2015-01-03 LAB — PROTIME-INR: INR: 2.2 — AB (ref 0.9–1.1)

## 2015-01-04 ENCOUNTER — Encounter: Payer: Self-pay | Admitting: Podiatry

## 2015-01-04 NOTE — Progress Notes (Signed)
Patient ID: Adam [name], male   DOB: 21-Jul-1960, 54 y.o.   MRN: [id number]  DOS: 12/20/14 s/p left third toe amputation  Subjective: 54 year old male presents the office today postop visit #2 status post left third toe". He states that overall he is doing well and has no pain. He denies any systemic complaints as fevers, chills, nausea, vomiting. He is at the dressing intact. Has continue with antibiotics. No other complaints at this time. No acute changes his last appointment.  Objective: AAO 3, NAD DP/PT pulses decreased bilaterally Protective sensation decreased with Simms Weinstein monofilament Status post left third digit amputation on the left foot. Sutures are intact however there is dehiscence of the wound which is superficial granular wound base. There is no probing, undermining, tunneling. There is no swelling erythema, ascending cellulitis, fluctuance, crepitus, malodor. There is a small amount of macerated tissue the plantar aspect in the sulcus of the toe extending to the fourth digit. No other open lesions or pre-ulcer lesions identified bilaterally. There is no pain with calf compression, swelling, warmth, erythema.  Assessment: 54 year old male 1 week says his left third toe amputation with dehiscence  Plan: -X-rays were obtained and reviewed with the patient.  -Treatment options discussed including all alternatives, risks, and complications -Sutures removed as they were not do anything and the wound has dehisced. The dehiscence appears to be superficial. There is small amount of epidermolysis on the plantar aspect which was debrided. Recommended continue with Silvadene dressing changes daily however Betadine wet-to-dry dressing was applied and the sulcus of the macerated tissue. Continue daily dressing changes at home. -Continue a surgical shoe. -Monitor for any clinical signs or symptoms of infection and directed to call the office immediately should any occur or go to the  ER. -continue antibiotics -Follow-up 1 week or sooner if any problems arise. In the meantime, encouraged to call the office with any questions, concerns, change in symptoms.   Celesta Gentile, DPM

## 2015-01-05 ENCOUNTER — Ambulatory Visit (INDEPENDENT_AMBULATORY_CARE_PROVIDER_SITE_OTHER): Payer: Medicare Other | Admitting: Pharmacist Clinician (PhC)/ Clinical Pharmacy Specialist

## 2015-01-05 ENCOUNTER — Ambulatory Visit (INDEPENDENT_AMBULATORY_CARE_PROVIDER_SITE_OTHER): Payer: Medicare Other | Admitting: Podiatry

## 2015-01-05 VITALS — BP 196/92 | HR 57 | Temp 96.4°F | Resp 16

## 2015-01-05 DIAGNOSIS — Z899 Acquired absence of limb, unspecified: Secondary | ICD-10-CM

## 2015-01-05 DIAGNOSIS — I4891 Unspecified atrial fibrillation: Secondary | ICD-10-CM

## 2015-01-05 DIAGNOSIS — Z7901 Long term (current) use of anticoagulants: Secondary | ICD-10-CM

## 2015-01-05 DIAGNOSIS — T8131XD Disruption of external operation (surgical) wound, not elsewhere classified, subsequent encounter: Secondary | ICD-10-CM

## 2015-01-05 MED ORDER — CLINDAMYCIN HCL 300 MG PO CAPS: 300.0000 mg | ORAL_CAPSULE | Freq: Three times a day (TID) | ORAL | Status: DC

## 2015-01-09 ENCOUNTER — Ambulatory Visit: Payer: Medicare Other

## 2015-01-09 ENCOUNTER — Ambulatory Visit: Payer: Medicare Other | Admitting: Pulmonary Disease

## 2015-01-10 LAB — PROTIME-INR: INR: 2.9 — AB (ref 0.9–1.1)

## 2015-01-12 ENCOUNTER — Encounter: Payer: Self-pay | Admitting: Podiatry

## 2015-01-12 ENCOUNTER — Ambulatory Visit (INDEPENDENT_AMBULATORY_CARE_PROVIDER_SITE_OTHER): Payer: Medicare Other | Admitting: Pharmacist Clinician (PhC)/ Clinical Pharmacy Specialist

## 2015-01-12 DIAGNOSIS — Z7901 Long term (current) use of anticoagulants: Secondary | ICD-10-CM

## 2015-01-12 DIAGNOSIS — I4891 Unspecified atrial fibrillation: Secondary | ICD-10-CM

## 2015-01-12 NOTE — Progress Notes (Signed)
Patient ID: Adam [name], male   DOB: 1960-10-26, 54 y.o.   MRN: [id number]  Subjective: 54 year old male presents the office today. As a #3 status post left third toe amputation performed on 12/20/2014. He states that overall he is doing well and is been taking antibiotics as directed. He has changed the dressing a couple times since last appointment. He denies any systemic complaints as fevers, chills, nausea, vomiting. Denies any calf pain, chest pain, shortness of breath. He denies any redness or red streaks around the foot denies any pus.no other complaints this time in no acute changes.  Objective: AAO 3, NAD DP/PT pulses decreased Protective sensation decreased Simms Weinstein monofilament Status post left third toe amputation. There is dehiscence of the wound is fibro-granular wound base. There is is malodor from the wound. There is no drainage or purulence expressed. There is no surrounding erythema, ascending cellulitis, fluctuance, crepitus. The wound does probe very close the metatarsal on the metatarsal head is covered at this time. There is no tunneling or undermining. There is macerated tissue around the fourth and fifth digits. No other open lesions or pre-ulcerative lesions. There is no pain with calf compression, swelling, warmth, erythema.  Assessment: 54 year old male status post left third toe amputation with dehiscence  Plan: -Treatment options discussed including all alternatives, risks, and complications -The woundwas debrided to granular tissue. There is also clean. After was cleaned the malodor decreased. -Continue clindamycin. -Continue chart issue. -Continued saline wet-to-dry dressing changes. I discussed with him and showed him to this. -Specimens the wound is nonhealing he may require further surgical intervention to remove the metatarsal head and wound debridement. -Monitor for any clinical signs or symptoms of infection and directed to call the office immediately  should any occur or go to the ER. -Follow-up 1 week or sooner if any problems arise. In the meantime, encouraged to call the office with any questions, concerns, change in symptoms.   Celesta Gentile, DPM

## 2015-01-17 ENCOUNTER — Ambulatory Visit (INDEPENDENT_AMBULATORY_CARE_PROVIDER_SITE_OTHER): Payer: Medicare Other | Admitting: Podiatry

## 2015-01-17 ENCOUNTER — Ambulatory Visit (INDEPENDENT_AMBULATORY_CARE_PROVIDER_SITE_OTHER): Payer: Medicare Other

## 2015-01-17 ENCOUNTER — Encounter: Payer: Self-pay | Admitting: Podiatry

## 2015-01-17 VITALS — Temp 97.0°F

## 2015-01-17 DIAGNOSIS — Z899 Acquired absence of limb, unspecified: Secondary | ICD-10-CM

## 2015-01-17 DIAGNOSIS — T8131XD Disruption of external operation (surgical) wound, not elsewhere classified, subsequent encounter: Secondary | ICD-10-CM

## 2015-01-17 LAB — PROTIME-INR: INR: 3.2 — AB (ref 0.9–1.1)

## 2015-01-17 NOTE — Patient Instructions (Signed)
Continue antibiotics

## 2015-01-18 ENCOUNTER — Encounter: Payer: Self-pay | Admitting: Podiatry

## 2015-01-18 NOTE — Progress Notes (Signed)
Patient ID: Adam [name], male   DOB: October 16, 1960, 54 y.o.   MRN: [id number]  Subjective: 54 year old male presents the office today status post left third toe amputation performed on 12/20/2014 resulting in a wound dehisence. He has been continuing with daily dressing changes with saline wet-to-dry dressing. He has noticed a small amount of clear bloody drainage. Denies any cough. Denies any surrounding redness or any red streaks. Denies any increase in swelling. He is still currently taking antibiotics. He denies any systemic complaints as fevers, chills, nausea, vomiting. Denies any calf pain, chest pain, shortness of breath. He denies any redness or red streaks around the foot denies any pus.no other complaints this time in no acute changes.  Objective: AAO 3, NAD DP/PT pulses decreased Protective sensation decreased Simms Weinstein monofilament Status post left third toe amputation. There is dehiscence of the wound is fibro-granular wound base. The wound does appear to be smaller compared to last appointment. However, the wound does probe to metatarsal head. There is no bone visualized. There is no purulence or malodor today. There is no overlying edema, erythema, ascending cellulitis, fluctuance, crepitus. The macerated tissue is significantly improved on the fourth and fifth digits. The fourth digit does appear to be hyperpigmented. The hyperpigmentation appears to be due to dry skin/scab overlying the fourth toe which is starting to flake off. No other open lesions or pre-ulcerative lesions. No pain with calf compression, swelling, warmth, erythema.  Assessment: 54 year old male status post left third toe amputation with dehiscence  Plan: -Treatment options discussed including all alternatives, risks, and complications -The wound was debrided to granular tissue. There is no purulence expressed. However, the wound does probe metatarsal head. -Continue clindamycin. -Continued saline wet-to-dry  dressing changes.  -I discussed with him today that as the metatarsal heads does probe to bone and there is question changes on x-ray concerning for osteomyelitis likely need to go back to surgery to remove part of the metatarsal head and for wound debridement and possible closure. We understands that this is a likelihood. I would like to hold off to see how the fourth toe heels due to the hyperpigmentation. I believe that there is dry skin and scab over the fourth toe which may fall off and the fourth toe may survive. -Monitor for any clinical signs or symptoms of infection and directed to call the office immediately should any occur or go to the ER. -Follow-up 1 week or sooner if any problems arise. In the meantime, encouraged to call the office with any questions, concerns, change in symptoms.   Celesta Gentile, DPM

## 2015-01-19 ENCOUNTER — Ambulatory Visit (INDEPENDENT_AMBULATORY_CARE_PROVIDER_SITE_OTHER): Payer: Medicare Other | Admitting: Pharmacist Clinician (PhC)/ Clinical Pharmacy Specialist

## 2015-01-19 DIAGNOSIS — I4891 Unspecified atrial fibrillation: Secondary | ICD-10-CM

## 2015-01-19 DIAGNOSIS — Z7901 Long term (current) use of anticoagulants: Secondary | ICD-10-CM

## 2015-01-24 LAB — PROTIME-INR: INR: 1.9 — AB (ref 0.9–1.1)

## 2015-01-26 ENCOUNTER — Ambulatory Visit (INDEPENDENT_AMBULATORY_CARE_PROVIDER_SITE_OTHER): Payer: Medicare Other | Admitting: Pharmacist Clinician (PhC)/ Clinical Pharmacy Specialist

## 2015-01-26 DIAGNOSIS — I4891 Unspecified atrial fibrillation: Secondary | ICD-10-CM

## 2015-01-26 DIAGNOSIS — Z7901 Long term (current) use of anticoagulants: Secondary | ICD-10-CM

## 2015-01-29 ENCOUNTER — Encounter: Payer: Self-pay | Admitting: Podiatry

## 2015-01-29 ENCOUNTER — Ambulatory Visit (INDEPENDENT_AMBULATORY_CARE_PROVIDER_SITE_OTHER): Payer: Medicare Other

## 2015-01-29 ENCOUNTER — Ambulatory Visit (INDEPENDENT_AMBULATORY_CARE_PROVIDER_SITE_OTHER): Payer: Medicare Other | Admitting: Podiatry

## 2015-01-29 VITALS — BP 103/79 | HR 72 | Resp 18

## 2015-01-29 DIAGNOSIS — Z899 Acquired absence of limb, unspecified: Secondary | ICD-10-CM | POA: Diagnosis not present

## 2015-01-29 DIAGNOSIS — T8131XS Disruption of external operation (surgical) wound, not elsewhere classified, sequela: Secondary | ICD-10-CM

## 2015-01-29 DIAGNOSIS — M869 Osteomyelitis, unspecified: Secondary | ICD-10-CM

## 2015-01-30 ENCOUNTER — Telehealth: Payer: Self-pay | Admitting: *Deleted

## 2015-01-30 ENCOUNTER — Encounter: Payer: Self-pay | Admitting: Vascular Surgery

## 2015-01-30 ENCOUNTER — Encounter: Payer: Self-pay | Admitting: Podiatry

## 2015-01-30 DIAGNOSIS — M86172 Other acute osteomyelitis, left ankle and foot: Secondary | ICD-10-CM

## 2015-01-30 NOTE — Progress Notes (Signed)
Patient ID: Adam [name], male   DOB: Jan 26, 1961, 54 y.o.   MRN: [id number]  Subjective: 54 year old male presents the office today status post left third toe amputation performed on 12/20/2014 resulting in a wound dehisence. He states that he is continue with saline wet-to-dry dressing changes. He denies any pus coming from the area and denies any drainage. Denies any surrounding redness or red streaks. He believes of the wound is closing. He currently denies any malodor. Denies any systemic complaints as fevers, chills, nausea, vomiting. No calf pain, chest pain, shortness of breath. No other complaints at this time in no acute changes otherwise.  Objective: AAO 3, NAD DP/PT pulses decreased Protective sensation decreased Simms Weinstein monofilament Status post left third toe amputation. The wound dehiscence. The wound has greatly improved compared to last appointment. The wound is much more narrow. The wound measures approximately 1.4 x 0.3. The wound does probe close to the metatarsal head of the third. There is no undermining or tunneling. There is no purulence expressed. After debridement the wound is healthy and there is no malodor. The wound is a granular wound base. There is no swelling erythema, ascending cellulitis, fluctuance, crepitus. There is dry skin on the plantar aspect of the foot. The fourth toe hyperpigmentation skin is peeling off the underlying skin appears to be healthy. No other open lesions or pre-ulcerative lesions identified bilaterally.  Assessment: 54 year old male status post left third toe amputation with dehiscence; concern for further osteomyelitis  Plan: -X-rays were obtained and reviewed with the patient. There is question will radiolucent changes within the third as well as the second metatarsal head concerning for possible osteomyelitis. Because of this an MRI was ordered. -Treatment options discussed including all alternatives, risks, and complications -The wound  was debrided to granular tissue. -Continue clindamycin. Currently denies any side effects. -Continued saline wet-to-dry dressing changes.  -Again discussed further surgical intervention. The wound appears to greatly improved her last appointment although I'm still concerned about further osteomyelitis. We will await the results of the MRI before proceeding with any further surgical intervention. He agrees to this plan. -Follow-up in 10 days or sooner if any problems are to arise. In the meantime I encouraged him to call the office with any questions, concerns, change in symptoms.  Celesta Gentile, DPM  *after the appointment, when scheduling the MRI he cannot have this due to having a defibrillator. Will order a CT scan without contrast of the foot.

## 2015-01-30 NOTE — Telephone Encounter (Addendum)
[facility] states send copy of pt's Defibrillator card and they will see if compatible with their equipment.  Pt to bring the card to copy today.   Faxed copies of pt's Defibrillator to [place] [facility].  Keene Breath [facility] [phone], left message stating they will call once defibrillator information had been reviewed.  Dr. Jacqualyn Posey cancelled MRI ordered CT Scan of left foot.  Faxed to [facility].  I informed pt of the change and gave him the [phone] appt line for [facility] to schedule.

## 2015-01-31 LAB — PROTIME-INR: INR: 1.7 — AB (ref 0.9–1.1)

## 2015-01-31 NOTE — Telephone Encounter (Signed)
-----   Message from Trula Slade, DPM sent at 01/30/2015  8:08 PM EDT ----- OK to order CT scan without contrast of the foot. Thanks.

## 2015-02-01 ENCOUNTER — Other Ambulatory Visit: Payer: Self-pay | Admitting: Podiatry

## 2015-02-01 ENCOUNTER — Ambulatory Visit (INDEPENDENT_AMBULATORY_CARE_PROVIDER_SITE_OTHER): Payer: Medicare Other | Admitting: Pharmacist Clinician (PhC)/ Clinical Pharmacy Specialist

## 2015-02-01 ENCOUNTER — Telehealth: Payer: Self-pay | Admitting: *Deleted

## 2015-02-01 ENCOUNTER — Ambulatory Visit (INDEPENDENT_AMBULATORY_CARE_PROVIDER_SITE_OTHER): Payer: Medicare Other | Admitting: Vascular Surgery

## 2015-02-01 ENCOUNTER — Encounter: Payer: Self-pay | Admitting: Vascular Surgery

## 2015-02-01 VITALS — BP 95/65 | HR 73 | Temp 97.8°F | Resp 16 | Ht 73.0 in | Wt 276.0 lb

## 2015-02-01 DIAGNOSIS — N186 End stage renal disease: Secondary | ICD-10-CM

## 2015-02-01 DIAGNOSIS — M86172 Other acute osteomyelitis, left ankle and foot: Secondary | ICD-10-CM

## 2015-02-01 DIAGNOSIS — Z7901 Long term (current) use of anticoagulants: Secondary | ICD-10-CM

## 2015-02-01 DIAGNOSIS — I4891 Unspecified atrial fibrillation: Secondary | ICD-10-CM

## 2015-02-01 DIAGNOSIS — Z992 Dependence on renal dialysis: Secondary | ICD-10-CM

## 2015-02-01 NOTE — Progress Notes (Signed)
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 This was done after an emergency room presentation for bleeding. He is currently using the proximal aspect of the fistula while the mid section heals. He denies any numbness or tingling in his hand. He has had no incisional drainage.  Physical exam:  Filed Vitals:   02/01/15 1408  BP: 95/65  Pulse: 73  Temp: 97.8 F (36.6 C)  TempSrc: Oral  Resp: 16  Height: 6\' 1"  (1.854 m)  Weight: 276 lb (125.193 kg)  SpO2: 96%    Extremities: Left upper extremity palpable thrill left upper arm AV fistula mid fistula incision healing with a small amount of eschar still in the central aspect  Assessment: Healing left upper arm AV fistula should be able to cannulate the full section by November 2016.  Plan: Patient will follow-up on as-needed basis.  [name], MD [facility] Office: [phone] Pager: [phone]

## 2015-02-01 NOTE — Telephone Encounter (Signed)
I signed

## 2015-02-01 NOTE — Telephone Encounter (Signed)
[facility] states CT order had to be changed, needs to be signed in [place].

## 2015-02-07 ENCOUNTER — Ambulatory Visit
Admission: RE | Admit: 2015-02-07 | Discharge: 2015-02-07 | Disposition: A | Discharge status: Home / Self Care | Payer: Medicare Other | Source: Ambulatory Visit | Attending: Podiatry | Admitting: Podiatry

## 2015-02-07 ENCOUNTER — Telehealth: Payer: Self-pay | Admitting: *Deleted

## 2015-02-07 DIAGNOSIS — M86172 Other acute osteomyelitis, left ankle and foot: Secondary | ICD-10-CM

## 2015-02-07 LAB — PROTIME-INR: INR: 2.5 — AB (ref 0.9–1.1)

## 2015-02-07 NOTE — Telephone Encounter (Signed)
PA initiated thru Cover My Meds for Letaris. Key: [place] Sent for review. Will await response.

## 2015-02-09 ENCOUNTER — Encounter: Payer: Medicare Other | Admitting: Podiatry

## 2015-02-12 ENCOUNTER — Ambulatory Visit (INDEPENDENT_AMBULATORY_CARE_PROVIDER_SITE_OTHER): Payer: Medicare Other | Admitting: Pharmacist Clinician (PhC)/ Clinical Pharmacy Specialist

## 2015-02-12 DIAGNOSIS — I4891 Unspecified atrial fibrillation: Secondary | ICD-10-CM

## 2015-02-12 DIAGNOSIS — Z7901 Long term (current) use of anticoagulants: Secondary | ICD-10-CM

## 2015-02-13 NOTE — Telephone Encounter (Signed)
Checked through cover my meds and it reports it is still under determination and can take up to 5 business days from Sept 30 (when submitted to plan). Will forward to [place] to f/u on PA

## 2015-02-13 NOTE — Telephone Encounter (Signed)
Adam [name] has been approved from 01/09/2015-02/07/2018.  Pharmacy aware.  Nothing further needed.

## 2015-02-14 LAB — PROTIME-INR: INR: 3.7 — AB (ref 0.9–1.1)

## 2015-02-16 ENCOUNTER — Encounter: Payer: Self-pay | Admitting: Podiatry

## 2015-02-16 ENCOUNTER — Ambulatory Visit (INDEPENDENT_AMBULATORY_CARE_PROVIDER_SITE_OTHER): Payer: Medicare Other | Admitting: Podiatry

## 2015-02-16 ENCOUNTER — Ambulatory Visit (INDEPENDENT_AMBULATORY_CARE_PROVIDER_SITE_OTHER): Payer: Medicare Other | Admitting: Pharmacist Clinician (PhC)/ Clinical Pharmacy Specialist

## 2015-02-16 ENCOUNTER — Ambulatory Visit (INDEPENDENT_AMBULATORY_CARE_PROVIDER_SITE_OTHER): Payer: Medicare Other

## 2015-02-16 DIAGNOSIS — Z7901 Long term (current) use of anticoagulants: Secondary | ICD-10-CM

## 2015-02-16 DIAGNOSIS — T8131XD Disruption of external operation (surgical) wound, not elsewhere classified, subsequent encounter: Secondary | ICD-10-CM

## 2015-02-16 DIAGNOSIS — Z899 Acquired absence of limb, unspecified: Secondary | ICD-10-CM

## 2015-02-16 DIAGNOSIS — M86172 Other acute osteomyelitis, left ankle and foot: Secondary | ICD-10-CM

## 2015-02-16 DIAGNOSIS — I4891 Unspecified atrial fibrillation: Secondary | ICD-10-CM

## 2015-02-16 NOTE — Patient Instructions (Signed)
Monitor for any signs/symptoms of infection. Call the office immediately if any occur or go directly to the emergency room. Call with any questions/concerns.  

## 2015-02-19 NOTE — Progress Notes (Signed)
Patient ID: Adam [name], male   DOB: 1960-05-16, 54 y.o.   MRN: [id number]  Subjective: 54 year old male presents the office today status post left third toe amputation performed on 12/20/2014 resulting in a wound dehisence. He believes that the wound is healing well.he denies any redness or red streaks on the area. Denies any drainage or purulence. No malodor. He is continuing surgical shoe. Denies any systemic complaints as fevers, chills, nausea, vomiting. No calf pain, chest pain, shortness of breath. No other complaints at this time in no acute changes otherwise.  Objective: AAO 3, NAD DP/PT pulses decreased Protective sensation decreased Simms Weinstein monofilament Status post left third toe amputation. The wound dehiscence. The wound continues to improved compared to last appointment. The wound does appear to be very narrow. After [place] the wound does continue to probe directly to the metatarsal head although much less degree. There is no exposed bone. The wound base is granular. There is no drainage or purulence. There is no swelling erythema, ascending saline's. There is hyperkeratotic tissue buildup on the plantar aspect of the metatarsal heads. Upon debridement no underlying ulceration, drainage or other signs of infection. The remaining toes are within normal color. No other open lesions or pre-ulcerative lesions identified bilaterally.  Assessment: 54 year old male status post left third toe amputation with dehiscence; concern for osteomyelitis  Plan: -X-rays were obtained and reviewed with the patient. There are areas of radiolucency within the metatarsal head consistent with possible osteomyelitis. As discussed the CT results which also revealed likely osteomyelitis of the metatarsal head. -Treatment options discussed including all alternatives, risks, and complications. discussed the surgery as well as conservative treatment. -At this time he was still hurt off on surgery. I  recommended metatarsal head resection to help prevent spreading of the infection. He'll consider the options.  -At today's appointment and was debrided to healthy, bleeding, granular wound base. The area was cleaned. Silvadene was applied followed by dry sterile dressing. Continue daily dressing changes. Continue a surgical shoe. -As color is no clinical signs of infection we'll hold off on further antibiotics. He's been on antibiotic for quite some time. -Follow-up with me in 10 days or sooner if any problems are to arise. That time we will evaluate the healing. There is still not significant healing and there is still probing the metatarsal head. The discussed surgery. In the meantime if he changes his mind wishes to proceed with surgical the office.   Celesta Gentile, DPM

## 2015-02-21 LAB — PROTIME-INR: INR: 3.3 — AB (ref 0.9–1.1)

## 2015-02-27 ENCOUNTER — Ambulatory Visit (INDEPENDENT_AMBULATORY_CARE_PROVIDER_SITE_OTHER): Payer: Medicare Other

## 2015-02-27 ENCOUNTER — Encounter: Payer: Self-pay | Admitting: Podiatry

## 2015-02-27 ENCOUNTER — Ambulatory Visit (INDEPENDENT_AMBULATORY_CARE_PROVIDER_SITE_OTHER): Payer: Medicare Other | Admitting: Pharmacist Clinician (PhC)/ Clinical Pharmacy Specialist

## 2015-02-27 ENCOUNTER — Ambulatory Visit (INDEPENDENT_AMBULATORY_CARE_PROVIDER_SITE_OTHER): Payer: Medicare Other | Admitting: Podiatry

## 2015-02-27 VITALS — BP 75/43 | HR 72 | Temp 97.8°F | Resp 18

## 2015-02-27 DIAGNOSIS — Z7901 Long term (current) use of anticoagulants: Secondary | ICD-10-CM

## 2015-02-27 DIAGNOSIS — Z899 Acquired absence of limb, unspecified: Secondary | ICD-10-CM

## 2015-02-27 DIAGNOSIS — T8131XD Disruption of external operation (surgical) wound, not elsewhere classified, subsequent encounter: Secondary | ICD-10-CM

## 2015-02-27 DIAGNOSIS — I4891 Unspecified atrial fibrillation: Secondary | ICD-10-CM

## 2015-02-27 MED ORDER — CLINDAMYCIN HCL 300 MG PO CAPS: 300.0000 mg | ORAL_CAPSULE | Freq: Three times a day (TID) | ORAL | Status: DC

## 2015-02-28 ENCOUNTER — Telehealth: Payer: Self-pay | Admitting: Pharmacist Clinician (PhC)/ Clinical Pharmacy Specialist

## 2015-02-28 ENCOUNTER — Encounter: Payer: Self-pay | Admitting: Podiatry

## 2015-02-28 LAB — PROTIME-INR: INR: 2.8 — AB (ref 0.9–1.1)

## 2015-02-28 NOTE — Progress Notes (Signed)
Patient ID: Adam [name], male   DOB: 1960-11-30, 54 y.o.   MRN: [id number]  Subjective: 54 year old male presents the office today for follow-up with vibration status post left third toe amputation resulting dehiscence with continued wound. He states he is doing well he has had no pain to the area has not noticed any drainage or pus coming from the wound. Denies any swelling or redness. He has continued with surgical shoe. He denies any systemic complaints such as fevers, chills, nausea, vomiting. No calf pain, chest pain, shortness of breath. No other complaints at this time. No acute changes since last appointment.  Objective: AAO 3, NAD DP/PT pulses decreased Protective sensation decreased with Simms Weinstein monofilament There has been any previous partial first ray amputation of the left foot and a previous partial second toe amputation. Status post third toe amputation with continued wound on the surgical site. There wound base is fibro-granular. There is no drainage or purulence. There is no surrounding erythema, ascending his. There is no areas of fluctuance or crepitus. No malodor. There are no other open lesions or pre-ulcerative lesions identified at this time. No tenderness to palpation of bilateral lower extremity is. No pain with calf compression, swelling, warmth, erythema.  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  -Treatment options discussed including all alternatives, risks, and complications -At this time I think he discuss further amputation. I discussed with him third metatarsal head resection due to the progression on x-ray and as the wound still probes directly down to bone. He agrees to this. -The incision placement as well as the postoperative course was discussed with the patient. I discussed risks of the surgery which include, but not limited to, infection, bleeding, pain, swelling,  need for further surgery, delayed or nonhealing, painful or ugly scar, numbness or sensation changes, over/under correction, recurrence, transfer lesions, further deformity, hardware failure, DVT/PE, loss of toe/foot. Patient understands these risks and wishes to proceed with surgery. The surgical consent was reviewed with the patient all 3 pages were signed. No promises or guarantees were given to the outcome of the procedure. All questions were answered to the best of my ability. Before the surgery the patient was encouraged to call the office if there is any further questions. The surgery will be performed at the [facility] on an outpatient basis.  Celesta Gentile, DPM

## 2015-02-28 NOTE — Telephone Encounter (Signed)
-----   Message from Trula Slade, DPM sent at 02/28/2015  4:12 PM EDT ----- Doristine Devoid, Thanks! I will have him restart it the day of surgery.  ----- Message -----    From: Rockne Menghini, RPH-CPP    Sent: 02/28/2015   3:59 PM      To: Trula Slade, DPM  Dr. Jacqualyn Posey  Mr. Heinzerling has a CHADS2-VASc score of 2, which allows [place] to hold his warfarin for 5 days prior to surgery without a lovenox bridge.  Would prefer that he re-start warfarin the evening of surgery or day after at the latest, as it will take 5-7 days to get back to therapeutic levels.    Thanks,  Erasmo Downer ----- Message -----    From: Trula Slade, DPM    Sent: 02/28/2015   3:44 PM      To: Rockne Menghini, RPH-CPP  I will be doing surgery on Mr. Legner on 03/09/15 for further amputation. I would like for him to come off the coumadin before surgery if possible. Do you know if he typically does a lovenox bridge with surgery or if he just comes off it? Thanks.

## 2015-03-02 ENCOUNTER — Encounter: Payer: Self-pay | Admitting: Pharmacist Clinician (PhC)/ Clinical Pharmacy Specialist

## 2015-03-02 ENCOUNTER — Ambulatory Visit (INDEPENDENT_AMBULATORY_CARE_PROVIDER_SITE_OTHER): Payer: Medicare Other | Admitting: Pharmacist Clinician (PhC)/ Clinical Pharmacy Specialist

## 2015-03-02 DIAGNOSIS — Z7901 Long term (current) use of anticoagulants: Secondary | ICD-10-CM

## 2015-03-02 DIAGNOSIS — I4891 Unspecified atrial fibrillation: Secondary | ICD-10-CM

## 2015-03-02 NOTE — Progress Notes (Signed)
This encounter was created in error - please disregard.

## 2015-03-07 LAB — PROTIME-INR: INR: 3.4 — AB (ref 0.9–1.1)

## 2015-03-08 ENCOUNTER — Encounter: Payer: Self-pay | Admitting: Pulmonary Disease

## 2015-03-08 ENCOUNTER — Ambulatory Visit (INDEPENDENT_AMBULATORY_CARE_PROVIDER_SITE_OTHER): Payer: Medicare Other | Admitting: Pulmonary Disease

## 2015-03-08 VITALS — BP 128/66 | HR 77 | Ht 72.0 in | Wt 280.0 lb

## 2015-03-08 DIAGNOSIS — I272 Other secondary pulmonary hypertension: Secondary | ICD-10-CM | POA: Diagnosis not present

## 2015-03-08 DIAGNOSIS — R0981 Nasal congestion: Secondary | ICD-10-CM

## 2015-03-08 NOTE — Assessment & Plan Note (Addendum)
We still have not received records from his cardiology office in [place] despite requesting these multiple times. I am going to send record request again from her primary care doctor's office in [place].  Plan: In the meantime he is to continue taking with Letaris and had Adcirca as he is doing. I want him to take half dose at circumflex on dialysis days, this strategy seems to be working He canceled his 6 minute walk today because of foot pain, we are going to have to reschedule this Follow-up 6 months

## 2015-03-08 NOTE — Progress Notes (Signed)
Subjective:    Patient ID: Yavuz Blackley, male    DOB: 1960-09-25, 54 y.o.   MRN: [id number]  Synopsis: Diagnosed with pulmonary hypertension in 2012. 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 Unfortunately his kidney transplant failed for uncertain reasons by [id number]. Was diagnosed with pulmonary hypertension by 2012 in [place]. Was started on [place] in [place] and moved to [place] not long afterwards.  HPI Chief Complaint  Patient presents with  . Follow-up    pt having foot sx tomorrow.  Pt also requesting ENT referral for deviated septum. Pt tolerating adcirca and letairis well.  pt c/o difficulty breathing in cold air through nose, sinus congestion, rhinorrhea, PND.     Keair has been doing well. He has been taking adcirca one tablet on dialysis days, and two on dialysis days.  His blood pressure has been much better with this strategy. He is remaining very active, climbing up and down stairs frequently without difficulty. He has a history of sinus surgery and he has been having more trouble with sinus congestion and several sinus infections.  He has had severe nose bleeds in the past as well.  He has been having a lot more rhinitis symptoms as well He has been having a lot more foot pain recently and put off his 6 min walk test today.  Past Medical History  Diagnosis Date  . Atrial fibrillation ([place])   . Nonischemic cardiomyopathy ([place])   . ICD (implantable cardioverter-defibrillator) in place     [employer]   . Renal failure     Dialysis  . HTN (hypertension)   . Thyroid cancer ([place])   . Pulmonary HTN ([place])   . Great toe amputation status ([place])     status post left hallux amputation 08/01/14      Review of Systems     Objective:   Physical Exam Filed Vitals:   03/08/15 1556  BP: 128/66  Pulse: 77  Height: 6' (1.829 m)  Weight: 280 lb (127.007 kg)  SpO2: 99%   RA  Gen: well appearing HENT: OP clear,   neck supple PULM: CTA B, normal percussion CV: RRR, slight systolic murmur trace edema GI: BS+, soft, nontender Derm: no cyanosis or rash Psyche: normal mood and affect       Assessment & Plan:  Sinus congestion He continues to struggle with sinus congestion and postnasal drip. Has a very complicated history of sinus surgery in the past. He would like to be referred to ear nose and throat.  Plan: I will refer him to Dr. Constance Holster here in town for further evaluation  Pulmonary hypertension [facility]) We still have not received records from his cardiology office in [place] despite requesting these multiple times. I am going to send record request again from her primary care doctor's office in [place].  Plan: In the meantime he is to continue taking with Letaris and had Adcirca as he is doing. I want him to take half dose at circumflex on dialysis days, this strategy seems to be working He canceled his 6 minute walk today because of foot pain, we are going to have to reschedule this Follow-up 6 months     Current outpatient prescriptions:  .  ambrisentan (LETAIRIS) 10 MG tablet, Take 1 tablet (10 mg total) by mouth daily., Disp: 30 tablet, Rfl: 2 .  amiodarone (PACERONE) 200 MG tablet, Take 200 mg by mouth daily., Disp: , Rfl:  .  calcium acetate (PHOSLO) 667 MG capsule, Take 1 capsule by mouth 3 (three) times daily before meals., Disp: , Rfl: 6 .  cinacalcet (SENSIPAR) 90 MG tablet, Take 90 mg by mouth daily., Disp: , Rfl:  .  clindamycin (CLEOCIN) 300 MG capsule, Take 1 capsule (300 mg total) by mouth 3 (three) times daily., Disp: 30 capsule, Rfl: 2 .  levothyroxine (SYNTHROID, LEVOTHROID) 112 MCG tablet, Take 112 mcg by mouth 2 (two) times daily., Disp: , Rfl:  .  midodrine (PROAMATINE) 10 MG tablet, Take 10 mg by mouth 3 (three) times daily., Disp: , Rfl:  .  silver sulfADIAZINE (SILVADENE) 1 % cream, Apply 1 application topically daily. (Patient taking differently: Apply 1  application topically daily as needed (FOR WOUND). ), Disp: 50 g, Rfl: 0 .  Tadalafil, PAH, (ADCIRCA) 20 MG TABS, Take 20 mg by mouth 2 (two) times daily., Disp: , Rfl:  .  warfarin (COUMADIN) 6 MG tablet, Take 1 to 1.5 tablets by mouth daily as directed by coumadin clinic You may resume coumadin tomorrow 12/26/14, Disp: 50 tablet, Rfl: 3

## 2015-03-08 NOTE — Patient Instructions (Signed)
We will refer you to your nose and throat for further evaluation Continue taking your medication as you're doing We will request records from your doctor's office in [place].

## 2015-03-08 NOTE — Assessment & Plan Note (Signed)
He continues to struggle with sinus congestion and postnasal drip. Has a very complicated history of sinus surgery in the past. He would like to be referred to ear nose and throat.  Plan: I will refer him to Dr. Constance Holster here in town for further evaluation

## 2015-03-09 ENCOUNTER — Encounter: Payer: Self-pay | Admitting: Podiatry

## 2015-03-09 DIAGNOSIS — M86472 Chronic osteomyelitis with draining sinus, left ankle and foot: Secondary | ICD-10-CM

## 2015-03-12 ENCOUNTER — Encounter: Payer: Self-pay | Admitting: Podiatry

## 2015-03-12 ENCOUNTER — Ambulatory Visit (INDEPENDENT_AMBULATORY_CARE_PROVIDER_SITE_OTHER): Payer: Medicare Other | Admitting: Podiatry

## 2015-03-12 ENCOUNTER — Ambulatory Visit (INDEPENDENT_AMBULATORY_CARE_PROVIDER_SITE_OTHER): Payer: Medicare Other

## 2015-03-12 DIAGNOSIS — Z899 Acquired absence of limb, unspecified: Secondary | ICD-10-CM | POA: Diagnosis not present

## 2015-03-12 DIAGNOSIS — T8131XD Disruption of external operation (surgical) wound, not elsewhere classified, subsequent encounter: Secondary | ICD-10-CM

## 2015-03-13 ENCOUNTER — Ambulatory Visit (INDEPENDENT_AMBULATORY_CARE_PROVIDER_SITE_OTHER): Payer: Medicare Other | Admitting: Pharmacist Clinician (PhC)/ Clinical Pharmacy Specialist

## 2015-03-13 DIAGNOSIS — Z7901 Long term (current) use of anticoagulants: Secondary | ICD-10-CM

## 2015-03-13 DIAGNOSIS — I4891 Unspecified atrial fibrillation: Secondary | ICD-10-CM

## 2015-03-13 NOTE — Progress Notes (Signed)
Patient ID: Adam [name], male   DOB: [birth year], 54 y.o.   MRN: [id number]  DOS: 03/09/15 s/p left 3rd metatarsal head resection  Subjective: 54 year old male presents the office today  3 days status post left third metatarsal head resection. He presents today for drain removal. He states that he is doing well and has no complaints. Has continue the surgical shoe. He is continue the antibiotics. Denies any systemic complaints such as fevers, chills, nausea, vomiting. No calf pain, chest pain, shortness of breath. No other complaints at this time.  Objective: AAO 3, NAD DP/PT pulses decreased; unchanged Protective sensation decreased with Simms Weinstein monofilament Status post left third toe amputation Previously with recent metatarsal head resection. Sutures are intact dorsally however along the inferior portion of the incision in between the second and fourth toes the wound has dehisced and the sutures are no longer holding incision. The wound is fibro-granular. There is no swelling erythema, ascending cellulitis, fluctuance, crepitus, malodor , drainage/purulence. The drain was within the bandages upon removal. There are no  Other open lesions or pre-ulcerative lesions. There is no pain with Compression, swelling, warmth, erythema.   Assessment:  54 year old male status post left third metatarsal head resection of wound dehiscence   Plan: -Treatment options discussed including all alternatives, risks, and complications -The dressings were removed as well as the drain. -The wound was debrided to healthy, granular tissue.  Betadine was pain over this area followed by a dry dressing. - Continue antibiotics. Awaiting cultures. - Continue with offloading boot. Cam boot was dispensed today. - he previously said amputations within the year which did eventually heal. Given this and he has ability to heal this. I discussed with him that  If the wound continues not to heal may need to go to the wound  care center for which he states he has been there previously for possible hyperbarics due to a "failed flap".  Will send the infection disease pending culture results as well. -Monitor for any clinical signs or symptoms of infection and directed to call the office immediately should any occur or go to the ER. -Follow-up on Friday for wound check or sooner if any problems arise. In the meantime, encouraged to call the office with any questions, concerns, change in symptoms.   Celesta Gentile, DPM

## 2015-03-15 ENCOUNTER — Telehealth: Payer: Self-pay | Admitting: *Deleted

## 2015-03-15 ENCOUNTER — Other Ambulatory Visit: Payer: Self-pay | Admitting: Podiatry

## 2015-03-15 DIAGNOSIS — Z899 Acquired absence of limb, unspecified: Secondary | ICD-10-CM

## 2015-03-15 NOTE — Telephone Encounter (Signed)
-----   Message from [place] sent at 03/15/2015  3:53 PM EDT ----- Fredia Sorrow, Dr Jacqualyn Posey would like for you to call patient's primary doctor and find out if it is ok for the patient to take ciprofloxacin due to patient being on so much medicine. thanks

## 2015-03-15 NOTE — Telephone Encounter (Addendum)
Dr. Jacqualyn Posey referred pt to Infectious Disease with [facility], and is changing his antibiotic.  I informed pt of the referral and the change of antibiotics.  Pt states understanding, I told pt I would also have paperwork with phone contacts to Infectious Disease for him at his appt with Dr. Jacqualyn Posey tomorrow.  Faxed referral to Infectious disease.  Informed Dr. Jacqualyn Posey of Dr. Hetty Blend Parker's statement that it would be okay to add Ciprofloxacin to pt's medication regimen.

## 2015-03-16 ENCOUNTER — Encounter: Payer: Self-pay | Admitting: Podiatry

## 2015-03-16 ENCOUNTER — Ambulatory Visit (INDEPENDENT_AMBULATORY_CARE_PROVIDER_SITE_OTHER): Payer: Medicare Other | Admitting: Podiatry

## 2015-03-16 DIAGNOSIS — T8131XD Disruption of external operation (surgical) wound, not elsewhere classified, subsequent encounter: Secondary | ICD-10-CM

## 2015-03-16 DIAGNOSIS — Z899 Acquired absence of limb, unspecified: Secondary | ICD-10-CM

## 2015-03-16 MED ORDER — CIPROFLOXACIN HCL 500 MG PO TABS: 500.0000 mg | ORAL_TABLET | Freq: Every day | ORAL | Status: DC

## 2015-03-21 LAB — PROTIME-INR: INR: 1.8 — AB (ref 0.9–1.1)

## 2015-03-21 NOTE — Progress Notes (Addendum)
Patient ID: Adam [name], male   DOB: 07-05-1960, 54 y.o.   MRN: [id number]  DOS: 03/09/15 s/p left 3rd metatarsal head resection  Subjective: 54 year old male presents the office today status post left third metatarsal head resection which has resulted in a wound dehisence.  He previously underwent a third toe amputation sesamoid developing also might as this third metatarsal head. We went back to surgery for resection of third metatarsal head and only developed a wound dehiscence. He denies any pus coming from the wound and denies any redness, red streaks, swelling. He gets an occasional malodor. He is currently taking clindamycin. Denies any systemic complaints such as fevers, chills, nausea, vomiting. No calf pain, chest pain, shortness of breath. No other complaints at this time.  Objective: AAO 3, NAD DP/PT pulses decreased; unchanged Protective sensation decreased with Simms Weinstein monofilament Status post left third partial ray amputation. There are sutures intact on the dorsal aspect of the incision however in between the second and fourth toe on the incision site the wound has dehisced. There is fibro-granular wound base present. The wound does not probe to bone at this time. There is a small amount of serosanguineous drainage expressed her there is no purulence. There is mild edema around the surgical site but there is no swelling erythema or ascending cellulitis. There is no fluctuance or crepitus. There is been up amputation of the hallux and partial amputation of the second toe. There are no other open lesions or pre-ulcerative lesions identified bilaterally. There is no pain with calf compression, swelling, warmth, erythema.   Assessment:  54 year old male status post left third metatarsal head resection of wound dehiscence   Plan: -Treatment options discussed including all alternatives, risks, and complications -Clean margin microbiology did reveal that there is bacterial growing.  There is moderate Escherichia coli and moderate Pantoea Agglomerans both of which were sensitive to ciprofloxacin. He was placed on ciprofloxacin today. I discussed with PCP given his other medications. Pathology result #1/3 metatarsal left foot did reveal osteomyelitis, fibrosis, and regenative changes and negative for malignancy. Pathology  #2 which was clean margin third metatarsal revealed metatarsal bone and surrounding soft tissue negative for inflammation. (Patholgy was from [facility] and micro is from [place]).  -Due to positive microbiology culture from clean margin will referred to infectious disease. -wound was debrided to healthy, bleeding, granular tissue. -Continue local wound care for now. I ordered a collagen with silver through [facility].  -Continue offloading shoe.  -Monitor for any clinical signs or symptoms of infection and directed to call the office immediately should any occur or go to the ER. -Follow-up in 1 week or sooner if any problems arise. In the meantime, encouraged to call the office with any questions, concerns, change in symptoms.   Celesta Gentile, DPM  Addendum: wound measures about 2 x 0.4 x 0.4 cm

## 2015-03-22 ENCOUNTER — Telehealth: Payer: Self-pay | Admitting: Family Medicine

## 2015-03-22 DIAGNOSIS — H35039 Hypertensive retinopathy, unspecified eye: Secondary | ICD-10-CM

## 2015-03-22 NOTE — Telephone Encounter (Signed)
I would like for the [facility] to send their records before ordering the test.  Algis Greenhouse. Jerline Pain, [facility] Resident PGY-2 03/22/2015 4:56 PM

## 2015-03-22 NOTE — Telephone Encounter (Signed)
Adam [name] for [facility] calls. Dr. Vernie Shanks would like for patient to have a carotid doppler/ultrasound done. Their office request that we order this imaging for patient. Please advise.

## 2015-03-22 NOTE — Telephone Encounter (Signed)
Will forward to PCP for review of this order. Letina Luckett, CMA.

## 2015-03-23 ENCOUNTER — Ambulatory Visit (INDEPENDENT_AMBULATORY_CARE_PROVIDER_SITE_OTHER): Payer: Medicare Other | Admitting: Pharmacist Clinician (PhC)/ Clinical Pharmacy Specialist

## 2015-03-23 DIAGNOSIS — I4891 Unspecified atrial fibrillation: Secondary | ICD-10-CM

## 2015-03-23 DIAGNOSIS — Z7901 Long term (current) use of anticoagulants: Secondary | ICD-10-CM

## 2015-03-23 NOTE — Telephone Encounter (Signed)
Called [facility] and advised them as directed below and they will fax over records to PCP. Thetis Schwimmer, CMA.

## 2015-03-26 ENCOUNTER — Other Ambulatory Visit: Payer: Self-pay | Admitting: Cardiology

## 2015-03-27 ENCOUNTER — Telehealth: Payer: Self-pay | Admitting: *Deleted

## 2015-03-27 ENCOUNTER — Other Ambulatory Visit: Payer: Self-pay | Admitting: Cardiology

## 2015-03-27 NOTE — Telephone Encounter (Addendum)
Pt states he had not gotten the supplies that Dr. Jacqualyn Posey said would be sent to his home, nor has [facility] called.  Dr. Jacqualyn Posey states the dressing was to be if Wound Care appt was not a close date, but ordered collogen with silver for left 3rd metatarsal site, measurement 2x0.4x0.4cm. Faxed to [facility] [phone].

## 2015-03-27 NOTE — Telephone Encounter (Signed)
About 2 x 0.4 x 0.4 cm

## 2015-03-27 NOTE — Telephone Encounter (Signed)
Received records from [facility]. They recommend carotid ultrasound for further investigation into the patient's hypertensive retinopathy. Will place order.  Algis Greenhouse. Jerline Pain, [place] [facility] Resident PGY-2 03/27/2015 4:00 PM

## 2015-03-28 NOTE — Telephone Encounter (Signed)
Spoke with patient and made him aware that ultrasound is scheduled for 04/02/15 at 2:30pm.  Patient is aware of phone number and location since he sees his cardiologist in that office.  Also order had to be changed.  Jazmin Hartsell,CMA

## 2015-03-30 ENCOUNTER — Ambulatory Visit (INDEPENDENT_AMBULATORY_CARE_PROVIDER_SITE_OTHER): Payer: Medicare Other | Admitting: Podiatry

## 2015-03-30 ENCOUNTER — Encounter: Payer: Self-pay | Admitting: Podiatry

## 2015-03-30 DIAGNOSIS — Z899 Acquired absence of limb, unspecified: Secondary | ICD-10-CM

## 2015-03-30 DIAGNOSIS — T8131XD Disruption of external operation (surgical) wound, not elsewhere classified, subsequent encounter: Secondary | ICD-10-CM

## 2015-03-30 MED ORDER — CIPROFLOXACIN HCL 500 MG PO TABS: 500.0000 mg | ORAL_TABLET | Freq: Every day | ORAL | Status: DC

## 2015-04-02 ENCOUNTER — Inpatient Hospital Stay (HOSPITAL_COMMUNITY): Admission: RE | Admit: 2015-04-02 | Payer: Medicare Other | Source: Ambulatory Visit

## 2015-04-02 NOTE — Progress Notes (Signed)
Patient ID: Adam [name], male   DOB: Oct 05, 1960, 54 y.o.   MRN: [id number]  DOS: 03/09/15 s/p left 3rd metatarsal head resection  Subjective: 54 year old male presents the office today status post left third metatarsal head resection which has resulted in a wound dehisence.  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 We went back to surgery for resection of third metatarsal head and only developed another wound dehiscence. He is currently taking clindamycin. He states that the wound he believes is healing and looking better. He denies any pus coming from the wound and denies any redness, red streaks, swelling.   Denies any malodor. Denies any systemic complaints such as fevers, chills, nausea, vomiting. No calf pain, chest pain, shortness of breath. No other complaints at this time.  Objective: AAO 3, NAD DP/PT pulses decreased; unchanged Protective sensation decreased with Simms Weinstein monofilament Status post left third partial ray amputation. There are sutures intact on the dorsal aspect of the incision however in between the second and fourth toe on the incision site the wound has dehisced. There is fibro-granular wound base present. The wound does not probe to bone at this time. After debridement the wound measures 2 x 0.3 x 0.4 cm and appears to be roughly unchanged compared to last appointment however the wound does appear to be somewhat cleaner. There is no drainage expressed. There is decreased edema around the surgical site but there is no surrounding erythema or ascending cellulitis. There is no fluctuance or crepitus.There is no malodor. There has been a previous amputation of the hallux and partial amputation of the second toe.  There are no other open lesions or pre-ulcerative lesions identified bilaterally.  There is no pain with calf compression, swelling, warmth, erythema.  Assessment: 54 year old male status  post left third metatarsal head resection of wound dehiscence   Plan: -Treatment options discussed including all alternatives, risks, and complications -Clean margin microbiology did reveal that there is bacterial growing. There is moderate Escherichia coli and moderate Pantoea Agglomerans both of which were sensitive to ciprofloxacin. Pathology result #1/3 metatarsal left foot did reveal osteomyelitis, fibrosis, and regenative changes and negative for malignancy. Pathology  #2 which was clean margin third metatarsal revealed metatarsal bone and surrounding soft tissue negative for inflammation. (Patholgy was from [facility] and micro is from [place]).  -Continue clindamycin and ciprofloxacin  -He has an infectious disease appointment scheduled however does not for couple weeks. -Wound was debrided to healthy, bleeding, granular tissue. -Continue with daily dressing changes. Continue with collagen silver from [facility] once arrived.  -Continue offloading shoe.  -Monitor for any clinical signs or symptoms of infection and directed to call the office immediately should any occur or go to the ER. -Follow-up in 1 week or sooner if any problems arise. In the meantime, encouraged to call the office with any questions, concerns, change in symptoms.   Celesta Gentile, DPM

## 2015-04-10 ENCOUNTER — Encounter: Payer: Self-pay | Admitting: Podiatry

## 2015-04-13 ENCOUNTER — Ambulatory Visit (INDEPENDENT_AMBULATORY_CARE_PROVIDER_SITE_OTHER): Payer: Medicare Other

## 2015-04-13 ENCOUNTER — Ambulatory Visit (INDEPENDENT_AMBULATORY_CARE_PROVIDER_SITE_OTHER): Payer: Medicare Other | Admitting: Podiatry

## 2015-04-13 DIAGNOSIS — Z899 Acquired absence of limb, unspecified: Secondary | ICD-10-CM

## 2015-04-13 DIAGNOSIS — T8131XD Disruption of external operation (surgical) wound, not elsewhere classified, subsequent encounter: Secondary | ICD-10-CM

## 2015-04-13 NOTE — Patient Instructions (Signed)
Continue antibiotics Monitor for any signs/symptoms of infection. Call the office immediately if any occur or go directly to the emergency room. Call with any questions/concerns.

## 2015-04-19 ENCOUNTER — Encounter: Payer: Self-pay | Admitting: Podiatry

## 2015-04-19 DIAGNOSIS — T8130XA Disruption of wound, unspecified, initial encounter: Secondary | ICD-10-CM | POA: Insufficient documentation

## 2015-04-19 DIAGNOSIS — Z899 Acquired absence of limb, unspecified: Secondary | ICD-10-CM | POA: Insufficient documentation

## 2015-04-19 NOTE — Progress Notes (Signed)
Patient ID: Adam [name], male   DOB: 1960/10/17, 54 y.o.   MRN: [id number]  DOS: 03/09/15 s/p left 3rd metatarsal head resection  Subjective: 54 year old male presents the office today status post left third metatarsal head resection which has resulted in a wound dehisence.  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 We went back to surgery for resection of third metatarsal head and only developed another wound dehiscence. He is currently taking clindamycin. He states that the wound he believes is healing and looking better. He denies any pus coming from the wound and denies any redness, red streaks, swelling. He previously underwent hallux amputation which also took quite some time to heel.   Denies any malodor. Denies any systemic complaints such as fevers, chills, nausea, vomiting. No calf pain, chest pain, shortness of breath. No other complaints at this time.  Objective: AAO 3, NAD DP/PT pulses decreased; unchanged Protective sensation decreased with Simms Weinstein monofilament Status post left third partial ray amputation. Along the surgical site is somewhat dehisced. The dorsal and plantar aspects appear to be very superficial granular however the central portion does appear to be somewhat deeper however does not probe to bone. The deepest part of the wound probes approximately 0.3 cm with a length of approximate 2 cm. There is no swelling edema, erythema, ascending cellulitis, fluctuance, crepitus, malodor, drainage/purulence. Prior to debridement is fibrotic tissue but after debridement the wound was debrided to a granular base. There is no malodor. There has been a previous amputation of the hallux and partial amputation of the second toe.  There are no other open lesions or pre-ulcerative lesions identified bilaterally.  There is no pain with calf compression, swelling, warmth, erythema.  Assessment: 54 year old male  status post left third metatarsal head resection of wound dehiscence   Plan: -Treatment options discussed including all alternatives, risks, and complications -X-rays were obtained and reviewed with the patient. Amputation margin appears to be sharp. -Clean margin microbiology did reveal that there is bacterial growing. There is moderate Escherichia coli and moderate Pantoea Agglomerans both of which were sensitive to ciprofloxacin. Pathology result #1/3 metatarsal left foot did reveal osteomyelitis, fibrosis, and regenative changes and negative for malignancy. Pathology  #2 which was clean margin third metatarsal revealed metatarsal bone and surrounding soft tissue negative for inflammation. (Patholgy was from [facility] and micro is from [place]).  -Continue clindamycin and ciprofloxacin  -He has an infectious disease appointment scheduled however does not for couple weeks Will continue with antibiotics until he follows up with them.  -Wound was debrided to healthy, bleeding, granular tissue. -Continue with daily dressing changes. Continue with collagen silver from [facility] once arrived.  -Continue offloading shoe.  -Monitor for any clinical signs or symptoms of infection and directed to call the office immediately should any occur or go to the ER. -Follow-up in 2 weeks or sooner if any problems arise. In the meantime, encouraged to call the office with any questions, concerns, change in symptoms.   Celesta Gentile, DPM

## 2015-04-20 LAB — PROTIME-INR: INR: 2.3 — AB (ref <=1.1)

## 2015-04-23 ENCOUNTER — Ambulatory Visit (INDEPENDENT_AMBULATORY_CARE_PROVIDER_SITE_OTHER): Payer: Medicare Other | Admitting: Pharmacist Clinician (PhC)/ Clinical Pharmacy Specialist

## 2015-04-23 DIAGNOSIS — I4891 Unspecified atrial fibrillation: Secondary | ICD-10-CM

## 2015-04-23 DIAGNOSIS — Z7901 Long term (current) use of anticoagulants: Secondary | ICD-10-CM

## 2015-04-27 ENCOUNTER — Encounter: Payer: Self-pay | Admitting: Podiatry

## 2015-04-27 ENCOUNTER — Ambulatory Visit (INDEPENDENT_AMBULATORY_CARE_PROVIDER_SITE_OTHER): Payer: Medicare Other | Admitting: Podiatry

## 2015-04-27 DIAGNOSIS — M86172 Other acute osteomyelitis, left ankle and foot: Secondary | ICD-10-CM

## 2015-04-27 DIAGNOSIS — T8131XD Disruption of external operation (surgical) wound, not elsewhere classified, subsequent encounter: Secondary | ICD-10-CM

## 2015-04-29 NOTE — Progress Notes (Signed)
Patient ID: Adam [name], male   DOB: 12-21-1960, 54 y.o.   MRN: [id number]  DOS: 03/09/15 s/p left 3rd metatarsal head resection  Subjective: 54 year old male presents the office today status post left third metatarsal head resection which has resulted in a wound dehisence.  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 We went back to surgery for resection of third metatarsal head and only developed another wound dehiscence. He is currently taking clindamycin and ciprofloxacin. He states that the wound he believes is continuing to heel and look better. He denies any pus coming from the wound and denies any redness, red streaks, swelling. He previously underwent hallux amputation which also took quite some time to heel as well.   Denies any malodor. Denies any systemic complaints such as fevers, chills, nausea, vomiting. No calf pain, chest pain, shortness of breath. No other complaints at this time.  Objective: AAO 3, NAD DP/PT pulses decreased; unchanged Protective sensation decreased with Simms Weinstein monofilament Status post left third partial ray amputation. Along the surgical site is somewhat dehisced. The dorsal and plantar aspects appear to be very superficial granular however the central portion does appear to be somewhat deeper however does not probe to bone. The deepest part of the wound probes approximately 0.2 cm with a length of approximate 1.5 cm.  The wound does appear to be smaller today more superficial.There is no surrounding edema, erythema, ascending cellulitis, fluctuance, crepitus, malodor, drainage/purulence. Prior to debridement is a small amount of  fibrotic tissue but after debridement the wound was debrided to a granular base. There is no malodor. There has been a previous amputation of the hallux and partial amputation of the second toe.  There are no other open lesions or pre-ulcerative lesions  identified bilaterally.  There is no pain with calf compression, swelling, warmth, erythema.  Assessment: 54 year old male status post left third metatarsal head resection of wound dehiscence   Plan: -Treatment options discussed including all alternatives, risks, and complications -Clean margin microbiology did reveal that there is bacterial growing. There is moderate Escherichia coli and moderate Pantoea Agglomerans both of which were sensitive to ciprofloxacin. Pathology result #1, 3rd  metatarsal left foot did reveal osteomyelitis, fibrosis, and regenative changes and negative for malignancy. Pathology  #2 which was clean margin third metatarsal revealed metatarsal bone and surrounding soft tissue negative for inflammation. (Patholgy was from [facility] and micro is from [place]).  -Continue clindamycin and ciprofloxacin  -He has an infectious disease appointment. Will continue antibiotics until follow-up with them. His last surgery was 03/09/15. He has been on antibiotic since then due to the positive clean margin. After starting cipro, the wound has been steadily healing.  -Wound was debrided to healthy, bleeding, granular tissue. -Continue with daily dressing changes.   -Continue offloading shoe.  -Monitor for any clinical signs or symptoms of infection and directed to call the office immediately should any occur or go to the ER. -Follow-up in 2 weeks or sooner if any problems arise. In the meantime, encouraged to call the office with any questions, concerns, change in symptoms.   Celesta Gentile, DPM

## 2015-04-30 ENCOUNTER — Encounter: Payer: Self-pay | Admitting: Infectious Diseases

## 2015-04-30 ENCOUNTER — Ambulatory Visit (INDEPENDENT_AMBULATORY_CARE_PROVIDER_SITE_OTHER): Payer: Medicare Other | Admitting: Infectious Diseases

## 2015-04-30 VITALS — BP 73/49 | HR 71 | Temp 97.9°F | Ht 73.0 in | Wt 258.0 lb

## 2015-04-30 DIAGNOSIS — M86172 Other acute osteomyelitis, left ankle and foot: Secondary | ICD-10-CM

## 2015-04-30 DIAGNOSIS — N186 End stage renal disease: Secondary | ICD-10-CM | POA: Diagnosis present

## 2015-04-30 DIAGNOSIS — M869 Osteomyelitis, unspecified: Secondary | ICD-10-CM | POA: Insufficient documentation

## 2015-04-30 NOTE — Progress Notes (Signed)
   Subjective:    Patient ID: Adam [name], male    DOB: March 16, 1961, 54 y.o.   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  End of October 2016 he had removal of 3rd toe on L foot due to osteomyelitis, his toe became dark.  His tissue Cx from 10-28 grew E coli (R- Amp, augmentin, ancef) and P agglomerans (pan-sens).  Has completed a course of clinda on 12-16 (took for ~ 3 weeks). His wound has healed slowly post surgery.  Has seen [facility] for poor circulation in his LLE, surgery being deffered for now.   PMHx, FHx, Sochx reviewed updated in EPIC.   Review of Systems  Constitutional: Negative for fever, chills, appetite change and unexpected weight change.  Respiratory: Negative for cough and shortness of breath.   Cardiovascular: Negative for chest pain.  Gastrointestinal: Negative for diarrhea and constipation.  Neurological: Negative for dizziness, light-headedness and headaches.   Renal- Dr Mercy Moore- gets HD at [place] [phone]).      Objective:   Physical Exam  Constitutional: He appears well-developed and well-nourished.  HENT:  Mouth/Throat: No oropharyngeal exudate.  Eyes: EOM are normal. Pupils are equal, round, and reactive to light.  Neck: Neck supple.  Cardiovascular: Normal rate, regular rhythm and normal heart sounds.   Pulmonary/Chest: Effort normal and breath sounds normal.  Abdominal: Soft. Bowel sounds are normal. There is no tenderness. There is no rebound.  Musculoskeletal:       Arms:      Feet:  Lymphadenopathy:    He has no cervical adenopathy.      Assessment & Plan:

## 2015-04-30 NOTE — Assessment & Plan Note (Signed)
Will f/u his nephrologist, appreciate partnering with them.

## 2015-04-30 NOTE — Assessment & Plan Note (Signed)
Will start him on ceftaz with HD for 6 weeks. Treat him as though he continues to have osteo of his toe.  His wound is clean today, although some crust.  Appreciate partnering with his foot doctor.  Will see him back in 4-6 weeks.

## 2015-05-01 ENCOUNTER — Telehealth: Payer: Self-pay | Admitting: *Deleted

## 2015-05-01 NOTE — Telephone Encounter (Signed)
Orders for IV antibiotics faxed to [facility] for IV antibiotics.  Confirmed receipt. Landis Gandy, RN

## 2015-05-09 LAB — PROTIME-INR: INR: 2 — AB (ref 0.9–1.1)

## 2015-05-10 ENCOUNTER — Ambulatory Visit (HOSPITAL_COMMUNITY)
Admission: RE | Admit: 2015-05-10 | Discharge: 2015-05-10 | Disposition: A | Discharge status: Home / Self Care | Payer: Medicare Other | Source: Ambulatory Visit | Attending: Family Medicine | Admitting: Family Medicine

## 2015-05-10 ENCOUNTER — Encounter: Payer: Self-pay | Admitting: Cardiology

## 2015-05-10 ENCOUNTER — Encounter: Payer: Self-pay | Admitting: Family Medicine

## 2015-05-10 ENCOUNTER — Telehealth: Payer: Self-pay | Admitting: Family Medicine

## 2015-05-10 ENCOUNTER — Ambulatory Visit (INDEPENDENT_AMBULATORY_CARE_PROVIDER_SITE_OTHER): Payer: Medicare Other | Admitting: Pharmacist Clinician (PhC)/ Clinical Pharmacy Specialist

## 2015-05-10 DIAGNOSIS — I6523 Occlusion and stenosis of bilateral carotid arteries: Secondary | ICD-10-CM | POA: Insufficient documentation

## 2015-05-10 DIAGNOSIS — N189 Chronic kidney disease, unspecified: Secondary | ICD-10-CM | POA: Diagnosis not present

## 2015-05-10 DIAGNOSIS — I4891 Unspecified atrial fibrillation: Secondary | ICD-10-CM

## 2015-05-10 DIAGNOSIS — R0989 Other specified symptoms and signs involving the circulatory and respiratory systems: Secondary | ICD-10-CM

## 2015-05-10 DIAGNOSIS — I129 Hypertensive chronic kidney disease with stage 1 through stage 4 chronic kidney disease, or unspecified chronic kidney disease: Secondary | ICD-10-CM | POA: Diagnosis not present

## 2015-05-10 DIAGNOSIS — Z7901 Long term (current) use of anticoagulants: Secondary | ICD-10-CM

## 2015-05-10 DIAGNOSIS — H35039 Hypertensive retinopathy, unspecified eye: Secondary | ICD-10-CM

## 2015-05-11 NOTE — Telephone Encounter (Signed)
Spoke with patient he is aware of results.  I also informed him of letter being sent and made an appt for 05/24/15 at 3:45pm. [facility]

## 2015-05-13 HISTORY — PX: CARDIAC CATHETERIZATION: SHX172

## 2015-05-15 ENCOUNTER — Encounter: Payer: Self-pay | Admitting: Podiatry

## 2015-05-15 ENCOUNTER — Ambulatory Visit (INDEPENDENT_AMBULATORY_CARE_PROVIDER_SITE_OTHER): Payer: Medicare Other | Admitting: Podiatry

## 2015-05-15 DIAGNOSIS — T8131XD Disruption of external operation (surgical) wound, not elsewhere classified, subsequent encounter: Secondary | ICD-10-CM | POA: Diagnosis not present

## 2015-05-15 DIAGNOSIS — Z899 Acquired absence of limb, unspecified: Secondary | ICD-10-CM

## 2015-05-16 NOTE — Progress Notes (Signed)
Patient ID: Adam [name], male   DOB: 1960-12-31, 55 y.o.   MRN: [id number]  DOS: 03/09/15 s/p left 3rd metatarsal head resection  Subjective: 55 year old male presents the office today status post left third metatarsal head resection which has resulted in a wound dehisence.  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 We went back to surgery for resection of third metatarsal head and only developed another wound dehiscence. He is currently on abx while at HD per ID. He states that the wound he believes is continuing to heel and look better. He denies any pus coming from the wound and denies any redness, red streaks, swelling. He previously underwent hallux amputation which also took quite some time to heel as well. Denies any malodor. Denies any systemic complaints such as fevers, chills, nausea, vomiting. No calf pain, chest pain, shortness of breath. No other complaints at this time.  Objective: AAO 3, NAD DP/PT pulses decreased; unchanged Protective sensation decreased with Simms Weinstein monofilament Status post left third partial ray amputation. At this time the wound incision appears to be healed there is a hyperkeratotic lesion overlying the area. Upon debridement the incision remains closed there is no drainage or open sore identified this point. There is no surrounding redness or red streaks or edema to the foot around the surgical site. No other open lesions or pre-ulcerative lesions identified this time. There is no pain with calf compression, swelling, warmth, erythema.  Assessment: 55 year old male status post left third metatarsal head resection of wound dehiscence which appears to be healed today.    Plan: -Treatment options discussed including all alternatives, risks, and complications -Clean margin microbiology did reveal that there is bacterial growing. There is moderate Escherichia coli and moderate Pantoea  Agglomerans both of which were sensitive to ciprofloxacin. Pathology result #1, 3rd  metatarsal left foot did reveal osteomyelitis, fibrosis, and regenative changes and negative for malignancy. Pathology  #2 which was clean margin third metatarsal revealed metatarsal bone and surrounding soft tissue negative for inflammation. (Patholgy was from [facility] and micro is from [place]). Continue ABX per ID.  -Hyperkeratotic lesion debridement without complications/bleeding and the underlying wound appears to be healed. Continue to monitor for recurrence. Can return to regular diabetic shoes with inserts. With any changes call the office immediatly. -Monitor for any clinical signs or symptoms of infection and directed to call the office immediately should any occur or go to the ER. -Follow-up in 3 weeks or sooner if any problems arise. In the meantime, encouraged to call the office with any questions, concerns, change in symptoms.   Celesta Gentile, DPM

## 2015-05-24 ENCOUNTER — Encounter: Payer: Self-pay | Admitting: Family Medicine

## 2015-05-24 ENCOUNTER — Ambulatory Visit (INDEPENDENT_AMBULATORY_CARE_PROVIDER_SITE_OTHER): Payer: Medicare Other | Admitting: Family Medicine

## 2015-05-24 VITALS — BP 112/64 | HR 78 | Temp 97.7°F | Ht 73.0 in | Wt 279.0 lb

## 2015-05-24 DIAGNOSIS — L987 Excessive and redundant skin and subcutaneous tissue: Secondary | ICD-10-CM

## 2015-05-24 DIAGNOSIS — I6529 Occlusion and stenosis of unspecified carotid artery: Secondary | ICD-10-CM | POA: Insufficient documentation

## 2015-05-24 DIAGNOSIS — I6523 Occlusion and stenosis of bilateral carotid arteries: Secondary | ICD-10-CM

## 2015-05-24 MED ORDER — ATORVASTATIN CALCIUM 40 MG PO TABS: 40.0000 mg | ORAL_TABLET | Freq: Every day | ORAL | Status: DC

## 2015-05-24 NOTE — Assessment & Plan Note (Signed)
S/p gastric banding. Will refer to plastic surgery for evaluation.

## 2015-05-24 NOTE — Assessment & Plan Note (Signed)
Currently asymptomatic. Will start atorvastatin 40mg  daily. Patient already on anticoagulation - will not start antiplatelet at this time. Will need a repeat carotid doppler in 1 year.

## 2015-05-24 NOTE — Progress Notes (Signed)
Subjective:  Adam [name] is a 55 y.o. male who presents to the [facility] today with a chief complaint of carotid artery stenosis follow up.   HPI:  Carotid Artery Stenosis, new diagnosis Patient had carotid ultrasound performed last month which showed 1-39% right ICA stenosis, 40-59% left ICA stenosis, and >50% bilateral ECA stenosis. Patient was referred by his ophthalmologist. Patient has not had any symptoms. No vision changes, no weakness or numbness. No difficulty speaking. Patient is currently on warfarin for chronic atrial fibrillation and is not taking a cholesterol medication. No chest pain or shortness of breath.   Skin Removal Surgery. Patient has lap ban surgery in [place] several years ago and now has a significant amount of excess skin. He is interested in seeing a surgeon to have this skin removed.   ROS: No fevers or chills, otherwise per HPI  PMH:  The following were reviewed and entered/updated in epic: Past Medical History  Diagnosis Date  . Atrial fibrillation ([place])   . Nonischemic cardiomyopathy ([place])   . ICD (implantable cardioverter-defibrillator) in place     Medtronic   . Renal failure     Dialysis  . HTN (hypertension)   . Thyroid cancer ([place]) 2011  . Pulmonary HTN ([place])   . Great toe amputation status ([place])     status post left hallux amputation 08/01/14  . Hypothyroidism (acquired)    Patient Active Problem List   Diagnosis Date Noted  . Carotid artery stenosis 05/24/2015  . Excess skin 05/24/2015  . Osteomyelitis of left foot ([place]) 04/30/2015  . Status post amputation 04/19/2015  . Wound dehiscence 04/19/2015  . Sinus congestion 03/08/2015  . Bleeding pseudoaneurysm of left brachiocephalic arteriovenous fistula ([place]) 12/24/2014  . Pure hypercholesterolemia 11/09/2014  . History of thyroid cancer 11/07/2014  . Obesity 11/07/2014  . Healthcare maintenance 11/07/2014  . Toe amputation status ([place]) 10/13/2014  . ICD (implantable  cardioverter-defibrillator) in place 07/25/2014  . ESRD (end stage renal disease) on hemodialysis 07/25/2014  . Hypotension (arterial) 07/25/2014  . Pulmonary hypertension ([place]) October 31, 202016  . Atrial fibrillation [I48.91] 05/24/2014  . Long-term (current) use of anticoagulants 05/24/2014   Past Surgical History  Procedure Laterality Date  . Thyroidectomy    . Dialysis fistula creation    . Kidney transplant    . Laparoscopic gastric banding    . Toe amputations    . Hernia repair    . Revison of arteriovenous fistula Left [id number]    Procedure: PLICATION OF ARTERIOVENOUS FISTULA ANEURYSM;  Surgeon: Elam Dutch, MD;  Location: [place];  Service: Vascular;  Laterality: Left;  . Thrombectomy w/ embolectomy Left 12/25/2014    Procedure: THROMBECTOMY ARTERIOVENOUS FISTULA;  Surgeon: Elam Dutch, MD;  Location: [facility];  Service: Vascular;  Laterality: Left;      Objective:  Physical Exam: BP 112/64 mmHg  Pulse 78  Temp(Src) 97.7 F (36.5 C) (Oral)  Ht 6\' 1"  (1.854 m)  Wt 279 lb (126.554 kg)  BMI 36.82 kg/m2  Gen: NAD, resting comfortably CV: RRR with no murmurs appreciated, no carotid bruits noted Lungs: NWOB, CTAB with no crackles, wheezes, or rhonchi GI: Normal bowel sounds present. Soft, Nontender, Nondistended. MSK: no edema, cyanosis, or clubbing noted Skin: warm, dry, excess skin folds noted on trunk Neuro: grossly normal, moves all extremities Psych: Normal affect and thought content  Studies: Carotid Doppler 04/2015: Heterogeneous calcific plaque with shadowing, bilaterally. 1-39% right ICA stenosis, higher velocities could be obscured by shadowing. 40-59% left  ICA stenosis, higher velocities could be obscured by shadowing. >50% bilateral ECA stenosis. Normal subclavian arteries, bilaterally. Patent vertebral arteries with antegrade flow. f/u 1 year   Assessment/Plan:  Carotid artery stenosis Currently asymptomatic. Will start atorvastatin 40mg  daily.  Patient already on anticoagulation - will not start antiplatelet at this time. Will need a repeat carotid doppler in 1 year.   Excess skin S/p gastric banding. Will refer to plastic surgery for evaluation.    Algis Greenhouse. Jerline Pain, [place] Resident PGY-2 05/24/2015 3:52 PM

## 2015-05-24 NOTE — Patient Instructions (Signed)
We will start you on a cholesterol medication today. This will help reduce the blockages in your carotid arteries. If you start having muscle aches, please let us know.  You should have a repeat scan of your neck in 1 year.  We will put in a referral to surgery to look at removing your excess skin.  Please come back for a regular visit in 3-6 months, or sooner if you need anything else.  Take care,  Dr Jerline Pain

## 2015-05-28 ENCOUNTER — Ambulatory Visit: Payer: Medicare Other | Admitting: Infectious Diseases

## 2015-05-30 LAB — PROTIME-INR: INR: 1.7 — AB (ref 0.9–1.1)

## 2015-06-04 ENCOUNTER — Ambulatory Visit (INDEPENDENT_AMBULATORY_CARE_PROVIDER_SITE_OTHER): Payer: Medicare Other | Admitting: Pharmacist Clinician (PhC)/ Clinical Pharmacy Specialist

## 2015-06-04 ENCOUNTER — Telehealth: Payer: Self-pay | Admitting: *Deleted

## 2015-06-04 DIAGNOSIS — Z7901 Long term (current) use of anticoagulants: Secondary | ICD-10-CM

## 2015-06-04 DIAGNOSIS — I4891 Unspecified atrial fibrillation: Secondary | ICD-10-CM

## 2015-06-04 NOTE — Telephone Encounter (Signed)
Pt is undergoing periodontal treatment with extractions, debridement and scaling. ? Can coumadin be held 3-5 days prior? Can cavitron be used? They plan on treatment being done the day after dialysis, does blood work need to be done prior to or the day after extractions? Precautions/contrairdications? Will forward for dr hochrein's review

## 2015-06-04 NOTE — Telephone Encounter (Signed)
He can hold his warfarin for 5 days prior to procedure.  I will defer the decision to check labs to the surgeon doing the procedure.  I do not know what a cavitron is.

## 2015-06-04 NOTE — Telephone Encounter (Signed)
Will forward this note to [employer]

## 2015-06-05 ENCOUNTER — Ambulatory Visit (INDEPENDENT_AMBULATORY_CARE_PROVIDER_SITE_OTHER): Payer: Medicare Other

## 2015-06-05 ENCOUNTER — Ambulatory Visit (INDEPENDENT_AMBULATORY_CARE_PROVIDER_SITE_OTHER): Payer: Medicare Other | Admitting: Podiatry

## 2015-06-05 ENCOUNTER — Encounter: Payer: Self-pay | Admitting: Podiatry

## 2015-06-05 DIAGNOSIS — Z899 Acquired absence of limb, unspecified: Secondary | ICD-10-CM

## 2015-06-05 DIAGNOSIS — T8131XD Disruption of external operation (surgical) wound, not elsewhere classified, subsequent encounter: Secondary | ICD-10-CM

## 2015-06-05 DIAGNOSIS — I6523 Occlusion and stenosis of bilateral carotid arteries: Secondary | ICD-10-CM

## 2015-06-06 ENCOUNTER — Ambulatory Visit: Payer: Medicare Other | Admitting: Infectious Diseases

## 2015-06-06 LAB — PROTIME-INR: INR: 1.4 — AB (ref 0.9–1.1)

## 2015-06-08 ENCOUNTER — Ambulatory Visit (INDEPENDENT_AMBULATORY_CARE_PROVIDER_SITE_OTHER): Payer: Medicare Other | Admitting: Pharmacist Clinician (PhC)/ Clinical Pharmacy Specialist

## 2015-06-08 DIAGNOSIS — Z7901 Long term (current) use of anticoagulants: Secondary | ICD-10-CM

## 2015-06-08 DIAGNOSIS — I4891 Unspecified atrial fibrillation: Secondary | ICD-10-CM

## 2015-06-10 NOTE — Progress Notes (Signed)
Patient ID: Adam [name], male   DOB: Oct 02, 1960, 55 y.o.   MRN: [id number]   DOS: 03/09/15 s/p left 3rd metatarsal head resection  Subjective: 55 year old male presents the office today status post left third metatarsal head resection which has resulted in a wound dehisence. He states that since last upon he believes of the areas healed he has not noticed any redness, swelling or any drainage from the area. He is currently receiving antibiotics while at dialysis per infectious disease. Denies any systemic complaints such as fevers, chills, nausea, vomiting. No calf pain, chest pain, shortness of breath. No other complaints at this time.  Objective: AAO 3, NAD DP/PT pulses decreased; unchanged Protective sensation decreased with Simms Weinstein monofilament Status post left third partial ray amputation. Over the surgical site is a small hyperkeratotic lesion. Hyperkeratotic lesion appears to be symptomatically improved compared to previous. There is a scar present. There is no open lesion identified at this time there is no drainage. There is no overlying edema, erythema, increase in warmth. There appears to be healed without any clinical signs of infection. No other open lesions or pre-ulcerative lesions identified this time. There is no pain with calf compression, swelling, warmth, erythema.  Assessment: 55 year old male status post left third metatarsal head resection of wound dehiscence which appears to be healed today.    Plan: -Treatment options discussed including all alternatives, risks, and complications -Clean margin microbiology did reveal that there is bacterial growing. There is moderate Escherichia coli and moderate Pantoea Agglomerans both of which were sensitive to ciprofloxacin. Pathology result #1, 3rd  metatarsal left foot did reveal osteomyelitis, fibrosis, and regenative changes and negative for malignancy. Pathology  #2 which was clean margin third metatarsal revealed  metatarsal bone and surrounding soft tissue negative for inflammation. (Patholgy was from [employer] and micro is from [place]). Continue ABX per ID.  -Hyperkeratotic lesion debridement without complications/bleeding and the underlying wound continues to appear to be healed. Continue to monitor for recurrence. Can return to regular diabetic shoes with inserts. With any changes call the office immediatly. -Monitor for any clinical signs or symptoms of infection and directed to call the office immediately should any occur or go to the ER. -Follow-up as scheduled or sooner if any problems arise. In the meantime, encouraged to call the office with any questions, concerns, change in symptoms.  *Next appointment he'll likely be finished with antibiotics. Hopefully after next appointment we'll get back to regular 3 month appointment.  Celesta Gentile, DPM

## 2015-07-04 LAB — PROTIME-INR: INR: 1.7 — AB (ref 0.9–1.1)

## 2015-07-05 ENCOUNTER — Ambulatory Visit (INDEPENDENT_AMBULATORY_CARE_PROVIDER_SITE_OTHER): Payer: Medicare Other | Admitting: Pharmacist Clinician (PhC)/ Clinical Pharmacy Specialist

## 2015-07-05 DIAGNOSIS — I4891 Unspecified atrial fibrillation: Secondary | ICD-10-CM

## 2015-07-05 DIAGNOSIS — Z7901 Long term (current) use of anticoagulants: Secondary | ICD-10-CM

## 2015-07-20 ENCOUNTER — Ambulatory Visit (INDEPENDENT_AMBULATORY_CARE_PROVIDER_SITE_OTHER): Payer: Medicare Other | Admitting: Podiatry

## 2015-07-20 ENCOUNTER — Encounter: Payer: Self-pay | Admitting: Pulmonary Disease

## 2015-07-20 ENCOUNTER — Ambulatory Visit (INDEPENDENT_AMBULATORY_CARE_PROVIDER_SITE_OTHER): Payer: Medicare Other | Admitting: Pulmonary Disease

## 2015-07-20 ENCOUNTER — Encounter: Payer: Self-pay | Admitting: Podiatry

## 2015-07-20 VITALS — BP 134/68 | HR 66 | Ht 72.0 in | Wt 274.0 lb

## 2015-07-20 DIAGNOSIS — I272 Other secondary pulmonary hypertension: Secondary | ICD-10-CM

## 2015-07-20 DIAGNOSIS — T8131XD Disruption of external operation (surgical) wound, not elsewhere classified, subsequent encounter: Secondary | ICD-10-CM | POA: Diagnosis not present

## 2015-07-20 DIAGNOSIS — I6523 Occlusion and stenosis of bilateral carotid arteries: Secondary | ICD-10-CM | POA: Diagnosis not present

## 2015-07-20 DIAGNOSIS — L84 Corns and callosities: Secondary | ICD-10-CM

## 2015-07-20 NOTE — Progress Notes (Signed)
Subjective:    Patient ID: Adam [name], male    DOB: 1960-07-06, 55 y.o.   MRN: [id number]  Synopsis: Diagnosed with pulmonary hypertension in 2012. 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 Unfortunately his kidney transplant failed for uncertain reasons by [id number]. Was diagnosed with pulmonary hypertension by 2012 in [place]. Was started on [place] in [place] and moved to [place] not long afterwards.  HPI Chief Complaint  Patient presents with  . Follow-up    pt doing well.  Pt wa.ls seen at [facility] on Tuesday for pre-transplant eval   Rashun was evaluated for a kidney transplant at [facility] on Tuesday.  He is supposed to see the cardiology team for a right heart catheterization.  He continues to take adcirca 1 pill on dia;lysis days, 2 other day.  He continues to take [employer].  He has low BP on some non-dialysis days, sometimes he feels fatigue, sleepy on those days.  Midodrine helps with that.    He does not know yet if he if he will be listed for kidney transplant.    He has not had any breathing problems.    Past Medical History  Diagnosis Date  . Atrial fibrillation ([place])   . Nonischemic cardiomyopathy ([place])   . ICD (implantable cardioverter-defibrillator) in place     Medtronic   . Renal failure     Dialysis  . HTN (hypertension)   . Thyroid cancer ([place]) 2011  . Pulmonary HTN ([place])   . Great toe amputation status ([place])     status post left hallux amputation 08/01/14  . Hypothyroidism (acquired)       Review of Systems  Constitutional: Negative for fever, chills and fatigue.  HENT: Negative for postnasal drip, rhinorrhea and sinus pressure.   Respiratory: Negative for cough, shortness of breath and wheezing.   Cardiovascular: Negative for chest pain, palpitations and leg swelling.       Objective:   Physical Exam Filed Vitals:   07/20/15 1343  BP: 134/68  Pulse: 66  Height: 6' (1.829 m)  Weight: 274 lb  (124.286 kg)  SpO2: 94%   RA  Gen: well appearing HENT: OP clear,  neck supple PULM: CTA B, normal percussion CV: RRR, slight systolic murmur trace edema GI: BS+, soft, nontender Derm: no cyanosis or rash Psyche: normal mood and affect       Assessment & Plan:  Pulmonary hypertension (HCC) [place] has been experiencing a bit more fatigue and dizziness at home with a low blood pressure. He says that Midrin helps with this. I think this is likely a side effect of the Adcirca. Without a recent right heart cath is difficult to titrate his medications but he has this lined up at [facility] in the next few weeks.  Plan: Right heart catheterization at [facility] as part of his kidney transplant evaluation For now, maintain Letairis and Adcirca at current dosing If right heart pressures are normal, then consider dropping with Terrace dosing to 10 mg daily given all the recent side effects from low blood pressure he's been experiencing Follow-up 4 months     Current outpatient prescriptions:  .  ambrisentan (LETAIRIS) 10 MG tablet, Take 1 tablet (10 mg total) by mouth daily., Disp: 30 tablet, Rfl: 2 .  amiodarone (PACERONE) 200 MG tablet, Take 200 mg by mouth daily., Disp: , Rfl:  .  atorvastatin (LIPITOR) 40 MG tablet, Take 1 tablet (40 mg total) by mouth  daily., Disp: 90 tablet, Rfl: 3 .  cinacalcet (SENSIPAR) 90 MG tablet, Take 90 mg by mouth daily., Disp: , Rfl:  .  levothyroxine (SYNTHROID, LEVOTHROID) 112 MCG tablet, Take 112 mcg by mouth 2 (two) times daily., Disp: , Rfl:  .  midodrine (PROAMATINE) 10 MG tablet, Take 10 mg by mouth 3 (three) times daily., Disp: , Rfl:  .  Tadalafil, PAH, (ADCIRCA) 20 MG TABS, Take 20 mg by mouth 2 (two) times daily., Disp: , Rfl:  .  warfarin (COUMADIN) 6 MG tablet, Take 1 to 1.5 tablets by mouth daily as directed by coumadin clinic You may resume coumadin tomorrow 12/26/14, Disp: 50 tablet, Rfl: 3 .  warfarin (COUMADIN) 6 MG tablet, TAKE 1 TO 1.5 TABLETS BY  MOUTH DAILY AS DIRECTED BY COUMADIN CLINIC, Disp: 50 tablet, Rfl: 3

## 2015-07-20 NOTE — Patient Instructions (Signed)
Continue taking your medications as you're doing I would like to have the results of the right heart catheterization I would be happy to discuss your situation with Dr. Gilles Chiquito after the right heart catheterization to help adjust your medications I will see you back in 4 months or she if needed

## 2015-07-20 NOTE — Assessment & Plan Note (Signed)
Adam [name] has been experiencing a bit more fatigue and dizziness at home with a low blood pressure. He says that Midrin helps with this. I think this is likely a side effect of the Adcirca. Without a recent right heart cath is difficult to titrate his medications but he has this lined up at [facility] in the next few weeks.  Plan: Right heart catheterization at [facility] as part of his kidney transplant evaluation For now, maintain Letairis and Adcirca at current dosing If right heart pressures are normal, then consider dropping with Terrace dosing to 10 mg daily given all the recent side effects from low blood pressure he's been experiencing Follow-up 4 months

## 2015-07-21 NOTE — Progress Notes (Signed)
Patient ID: Adam [name], male   DOB: 09/14/1960, 55 y.o.   MRN: [id number]  DOS: 03/09/15 s/p left 3rd metatarsal head resection  Subjective: 55 year old male presents the office today status post left third metatarsal head resection which has resulted in a wound dehisence. He states that overall he is doing well he has had not drainage or pus coming from areas and no swelling or redness. He is going not taking any antibiotics.  Denies any systemic complaints such as fevers, chills, nausea, vomiting. No calf pain, chest pain, shortness of breath. No other complaints at this time.  Objective: AAO 3, NAD DP/PT pulses decreased; unchanged Protective sensation decreased with Simms Weinstein monofilament Status post left third partial ray amputation. The wound appears to be completely healed at this time the scars overlying the area. There is very small amount of hyperkeratotic tissue the distal aspect of the incision. Upon debridement no underlying ulceration, drainage or other signs of infection. There are no other open lesions or pre-ulcerative lesion identified at this time. No clinical signs of infection. There is no pain with calf compression, swelling, warmth, erythema.  Assessment: 55 year old male status post left third metatarsal head resection of wound dehiscence which appears to be healed today.    Plan: -Treatment options discussed including all alternatives, risks, and complications -Clean margin microbiology did reveal that there is bacterial growing. There is moderate Escherichia coli and moderate Pantoea Agglomerans both of which were sensitive to ciprofloxacin. Pathology result #1, 3rd  metatarsal left foot did reveal osteomyelitis, fibrosis, and regenative changes and negative for malignancy. Pathology  #2 which was clean margin third metatarsal revealed metatarsal bone and surrounding soft tissue negative for inflammation. (Patholgy was from [employer] and micro is from [place]). Continue  ABX per ID.  -Hyperkeratotic lesion debridement without complications/bleeding and the underlying wound continues to appear to be healed. Continue to monitor for recurrence.  -At this time he is doing well. There is no signs of infection. Continue daily foot inspection. -Follow-up in 3 months for routine care or sooner if any problems arise. In the meantime, encouraged to call the office with any questions, concerns, change in symptoms.   Celesta Gentile, DPM

## 2015-08-01 ENCOUNTER — Telehealth: Payer: Self-pay | Admitting: *Deleted

## 2015-08-01 LAB — PROTIME-INR: INR: 2 — AB (ref 0.9–1.1)

## 2015-08-01 NOTE — Telephone Encounter (Signed)
LMOM requesting call back from [place].

## 2015-08-01 NOTE — Telephone Encounter (Signed)
Chrys Racer returned call.  She requested to speak with Dr. Sallyanne Kuster regarding patient's upcoming procedure and wanted to know if it would be safe to use a Cavitron during the procedure.  Dannielle Burn the [place] office number, but advised that she should contact [employer] tech services regarding use of a Cavitron.  Chrys Racer verbalizes understanding and denies questions or concerns at this time.

## 2015-08-01 NOTE — Telephone Encounter (Signed)
Chrys Racer calling from Dr. Owens Shark, [place] office  Pt being set up for a periodontic procedure. She did not give me further details. Wanted to speak to someone in device dept.   msg routed.

## 2015-08-03 ENCOUNTER — Ambulatory Visit (INDEPENDENT_AMBULATORY_CARE_PROVIDER_SITE_OTHER): Payer: Medicare Other | Admitting: Pharmacist Clinician (PhC)/ Clinical Pharmacy Specialist

## 2015-08-03 DIAGNOSIS — I4891 Unspecified atrial fibrillation: Secondary | ICD-10-CM

## 2015-08-03 DIAGNOSIS — Z7901 Long term (current) use of anticoagulants: Secondary | ICD-10-CM

## 2015-08-06 ENCOUNTER — Ambulatory Visit (INDEPENDENT_AMBULATORY_CARE_PROVIDER_SITE_OTHER): Payer: Medicare Other | Admitting: Family Medicine

## 2015-08-06 ENCOUNTER — Encounter: Payer: Self-pay | Admitting: Family Medicine

## 2015-08-06 VITALS — BP 92/60 | HR 83 | Temp 99.5°F | Wt 271.0 lb

## 2015-08-06 DIAGNOSIS — J069 Acute upper respiratory infection, unspecified: Secondary | ICD-10-CM | POA: Diagnosis present

## 2015-08-06 DIAGNOSIS — I6523 Occlusion and stenosis of bilateral carotid arteries: Secondary | ICD-10-CM

## 2015-08-06 DIAGNOSIS — B9789 Other viral agents as the cause of diseases classified elsewhere: Principal | ICD-10-CM

## 2015-08-06 MED ORDER — PSEUDOEPHEDRINE-GUAIFENESIN ER 120-1200 MG PO TB12: 1.0000 | ORAL_TABLET | Freq: Two times a day (BID) | ORAL | Status: DC

## 2015-08-06 MED ORDER — FLUTICASONE PROPIONATE 50 MCG/ACT NA SUSP: 2.0000 | Freq: Every day | NASAL | Status: DC

## 2015-08-06 NOTE — Patient Instructions (Signed)
You have a cold. This takes 7-10 days to recover from.  Please use the fluticasone and afrin nasal sprays to help with your nasal congestion. You should only use the afrin for a maximum of 3 days. You can use the fluticasone as long as you need.  We will also send in a prescription for mucinex-D.  If you are not getting better by the end of the week, or you are getting worse, please let us know.  Take care,  Dr Jerline Pain

## 2015-08-06 NOTE — Progress Notes (Signed)
    Subjective:  Adam [name] is a 55 y.o. male who presents to the [facility] today for same day appointment with a chief complaint of cough.   HPI:  Cough Started 5 days ago. Associated symptoms include nasal congestion, sore throat, and post-nasal drip. No known sick contacts. Has tried Alka-Seltzer cold and flu which has not helped. No fevers or chills. No shortness of breath. No facial pain.   ROS: Per HPI  Objective:  Physical Exam: BP 92/60 mmHg  Pulse 83  Temp(Src) 99.5 F (37.5 C) (Oral)  Wt 271 lb (122.925 kg)  SpO2 91%  Gen: NAD, resting comfortably HEENT: OP clear. Nasal turbinates erythematous and boggy bilaterally. CV: RRR with no murmurs appreciated Pulm: NWOB, CTAB with no crackles, wheezes, or rhonchi MSK: no edema, cyanosis, or clubbing noted Skin: warm, dry Neuro: grossly normal, moves all extremities Psych: Normal affect and thought content  Assessment/Plan:  Cough Symptoms consistent with viral URI. No signs of bacterial infection. Will treat symptomatically with fluticasone and mucinex. Also recommended short course of Afrin nasal spray. Return precautions reviewed. Follow up as needed.  Algis Greenhouse. Jerline Pain, [place] [facility] Resident PGY-2 08/06/2015 2:58 PM

## 2015-08-08 ENCOUNTER — Telehealth: Payer: Self-pay | Admitting: Pulmonary Disease

## 2015-08-08 NOTE — Telephone Encounter (Signed)
Called spoke with Steward Ros w/ [facility]. They are needing new RX for pt adcirca faxed to them with new directions as patient advised them he is taking this different than prescribed. They need this RX faxed to [phone]. According to last OV it stated pt takes 1 tablet on dialysis days and 2 tabs on the other days. Is it okay to write RX like this or would you prefer RX to state 1 BID Dr. [place] Bells? thanks

## 2015-08-09 MED ORDER — TADALAFIL (PAH) 20 MG PO TABS: ORAL_TABLET | ORAL | Status: DC

## 2015-08-09 NOTE — Telephone Encounter (Signed)
OK to write Rx as per my last office note (1 dialysis days, 2 other days)

## 2015-08-09 NOTE — Telephone Encounter (Signed)
Rx printed and faxed to the number provided

## 2015-08-10 ENCOUNTER — Telehealth: Payer: Self-pay

## 2015-08-10 ENCOUNTER — Encounter: Payer: Self-pay | Admitting: Family Medicine

## 2015-08-10 ENCOUNTER — Ambulatory Visit (INDEPENDENT_AMBULATORY_CARE_PROVIDER_SITE_OTHER): Payer: Medicare Other | Admitting: Family Medicine

## 2015-08-10 VITALS — BP 98/58 | HR 80 | Temp 98.1°F | Wt 271.3 lb

## 2015-08-10 DIAGNOSIS — R058 Other specified cough: Secondary | ICD-10-CM

## 2015-08-10 DIAGNOSIS — R05 Cough: Secondary | ICD-10-CM

## 2015-08-10 DIAGNOSIS — I6523 Occlusion and stenosis of bilateral carotid arteries: Secondary | ICD-10-CM | POA: Diagnosis not present

## 2015-08-10 MED ORDER — BENZONATATE 200 MG PO CAPS: 200.0000 mg | ORAL_CAPSULE | Freq: Two times a day (BID) | ORAL | Status: DC | PRN

## 2015-08-10 NOTE — Telephone Encounter (Signed)
Patient will be having a periodontal treatment using an ultasonic dental scaler.  Per MCr,  -No contraindication to use of ultrasonic dental scaler -Preferably avoid epinephrine-containing local anesthetic -Warfarin can be stopped, if necessary, 4-5 days before extractions and resumed immediately after.  Faxed to Jeannie Done, DDS.

## 2015-08-10 NOTE — Progress Notes (Signed)
    Subjective   Adam [name] is a 55 y.o. male that presents for a same day visit  1. Cough: Symptoms started about one week ago with congestion, rhinorrhea and no fevers. His symptoms have improved apart from his cough. He states he feels like there is congestion in his chest. He reports no fevers still, dyspnea, or chest pain.   ROS Per HPI  Social History  Substance Use Topics  . Smoking status: Never Smoker   . Smokeless tobacco: Never Used  . Alcohol Use: No    Allergies  Allergen Reactions  . Ivp Dye [Iodinated Diagnostic Agents] Nausea And Vomiting  . Vasotec [Enalapril] Swelling    Objective   BP 98/58 mmHg  Pulse 80  Temp(Src) 98.1 F (36.7 C) (Oral)  Wt 271 lb 4.8 oz (123.061 kg)  General: Well appearing, no distress HEENT:   Head:  Normocephalic  Eyes: Pupils equal and reactive to light/accomodation. Extraocular movements intact bilaterally.  Ears: Tympanic membranes normal bilaterally.  Nose/Throat: Nares patent bilaterally. Oropharnx clear and moist.  Neck: No cervical adenopathy bilaterally Respiratory/Chest: Clear to auscultation bilaterally. Unlabored work of breathing. No wheezing or rales.  Assessment and Plan   1. Post-viral cough syndrome Will trial on Tessalon. Discussed that cough can be persistent after viral illness. Do not suspect pneumonia with no fevers, dyspnea, chest pain or decreased breath sounds. Recommend follow-up in 1-2 weeks if no improvement as he may need antibiotics for bronchitis. - benzonatate (TESSALON) 200 MG capsule; Take 1 capsule (200 mg total) by mouth 2 (two) times daily as needed for cough.  Dispense: 20 capsule; Refill: 0

## 2015-08-10 NOTE — Patient Instructions (Signed)
Thank you for coming to see me today. It was a pleasure. Today we talked about:   Cough: this is likely because of your recent viral illness. I will prescribed Tessalon Per[place]. Take this up to twice per day. If symptoms worsen, please let me know.  If you have any questions or concerns, please do not hesitate to call the office at [phone].  Sincerely,  Cordelia Poche, MD

## 2015-08-13 ENCOUNTER — Ambulatory Visit (INDEPENDENT_AMBULATORY_CARE_PROVIDER_SITE_OTHER): Payer: Medicare Other | Admitting: Podiatry

## 2015-08-13 ENCOUNTER — Telehealth: Payer: Self-pay | Admitting: Pulmonary Disease

## 2015-08-13 ENCOUNTER — Encounter: Payer: Self-pay | Admitting: Podiatry

## 2015-08-13 ENCOUNTER — Ambulatory Visit (INDEPENDENT_AMBULATORY_CARE_PROVIDER_SITE_OTHER): Payer: Medicare Other

## 2015-08-13 VITALS — BP 88/51 | HR 75 | Resp 16

## 2015-08-13 DIAGNOSIS — M79671 Pain in right foot: Secondary | ICD-10-CM

## 2015-08-13 DIAGNOSIS — L97512 Non-pressure chronic ulcer of other part of right foot with fat layer exposed: Secondary | ICD-10-CM

## 2015-08-13 DIAGNOSIS — L02619 Cutaneous abscess of unspecified foot: Secondary | ICD-10-CM

## 2015-08-13 DIAGNOSIS — L89891 Pressure ulcer of other site, stage 1: Secondary | ICD-10-CM | POA: Diagnosis not present

## 2015-08-13 DIAGNOSIS — I6523 Occlusion and stenosis of bilateral carotid arteries: Secondary | ICD-10-CM

## 2015-08-13 DIAGNOSIS — L03119 Cellulitis of unspecified part of limb: Secondary | ICD-10-CM

## 2015-08-13 LAB — BASIC METABOLIC PANEL
BUN: 18 mg/dL (ref 7–25)
CHLORIDE: 95 mmol/L — AB (ref 98–110)
CO2: 29 mmol/L (ref 20–31)
Calcium: 8.7 mg/dL (ref 8.6–10.3)
Creat: 4.91 mg/dL — ABNORMAL HIGH (ref 0.70–1.33)
Glucose, Bld: 65 mg/dL (ref 65–99)
POTASSIUM: 3.4 mmol/L — AB (ref 3.5–5.3)
Sodium: 137 mmol/L (ref 135–146)

## 2015-08-13 LAB — CBC WITH DIFFERENTIAL/PLATELET
BASOS PCT: 0 %
Basophils Absolute: 0 cells/uL (ref 0–200)
Eosinophils Absolute: 84 cells/uL (ref 15–500)
Eosinophils Relative: 2 %
HEMATOCRIT: 34.3 % — AB (ref 38.5–50.0)
Hemoglobin: 11.7 g/dL — ABNORMAL LOW (ref 13.2–17.1)
LYMPHS ABS: 462 {cells}/uL — AB (ref 850–3900)
LYMPHS PCT: 11 %
MCH: 31.1 pg (ref 27.0–33.0)
MCHC: 34.1 g/dL (ref 32.0–36.0)
MCV: 91.2 fL (ref 80.0–100.0)
MONO ABS: 294 {cells}/uL (ref 200–950)
MPV: 10 fL (ref 7.5–12.5)
Monocytes Relative: 7 %
Neutro Abs: 3360 cells/uL (ref 1500–7800)
Neutrophils Relative %: 80 %
Platelets: 128 10*3/uL — ABNORMAL LOW (ref 140–400)
RBC: 3.76 MIL/uL — AB (ref 4.20–5.80)
RDW: 16.5 % — AB (ref 11.0–15.0)
WBC: 4.2 10*3/uL (ref 3.8–10.8)

## 2015-08-13 LAB — SEDIMENTATION RATE: Sed Rate: 60 mm/hr — ABNORMAL HIGH (ref 0–20)

## 2015-08-13 LAB — C-REACTIVE PROTEIN: CRP: 5.8 mg/dL — AB (ref <0.60)

## 2015-08-13 MED ORDER — CEPHALEXIN 500 MG PO CAPS: 500.0000 mg | ORAL_CAPSULE | Freq: Two times a day (BID) | ORAL | Status: DC

## 2015-08-13 NOTE — Telephone Encounter (Signed)
Called and spoke with Adam [name] at [facility] pharmacy. She states the previous call was about the Adcirca. She states they need clarification on how the patient takes his Adcirca. I explained that the rx should say: Take 1 tablet on dialysis days, and 2 tablets on the other days. I explained to her that the rx was faxed on 08/09/15. She states that the rx was not received. I verified that the Adcirca was for #60 with 5 refills under BQ. She voiced understanding and had no further questions. She states that nothing further is needed.

## 2015-08-14 ENCOUNTER — Encounter: Payer: Self-pay | Admitting: Podiatry

## 2015-08-14 NOTE — Progress Notes (Signed)
Patient ID: Adam [name], male   DOB: 1960-08-13, 55 y.o.   MRN: [id number]   Subjective: 55 year old male presents the office today for concerns of pain in the wound to the top of the right foot which is been ongoing since right after I saw him last. He states that he has a wound of the top of the right foot which would heal in open back up. Occasionally has some clear to bloody drainage. Denies any pus. Denies any swelling or warmth of the foot. He states the area is painful. He has not been on antibiotics recently. No injury or trauma.  Denies any systemic complaints such as fevers, chills, nausea, vomiting. No calf pain, chest pain, shortness of breath. No other complaints at this time.  Objective: AAO 3, NAD DP/PT pulses decreased; unchanged Protective sensation decreased with Simms Weinstein monofilament On the left side the surgical site appears became was no open lesions identified at this time. On the right foot there is a ulceration of the dorsal aspect of the foot just proximal to the third MTPJ/interspace measuring 0.6 x 0.6 x 1.5 cm. There is no pus expressed. The wound does probe close to bone. There is no pus expressed. Bloody drainage expressed. No surrounding erythema, ascending cellulitis, fluctuance, crepitus, malodor. There is mild edema to the foot. There is no edema to the toe or open sore identified on the toe. There are no other open lesions or pre-ulcerative lesion identified at this time. No clinical signs of infection. There is no pain with calf compression, swelling, warmth, erythema.  Assessment: 55 year old male ulceration right foot   Plan: -Treatment options discussed including all alternatives, risks, and complications -Etiology of symptoms were discussed -X-rays were obtained and reviewed with the patient. There appears to be cortical changes to the third toe however there is no wound to this area. This may be chronic versus osteomyelitis. -The wound was debrided  today. -The wound was packed with saline with the dry dressing. Continue this at home daily. -Keflex. -Discussed possibly of amputation. -Order blood work. -Follow-up in 1 week or sooner if any problems arise. In the meantime, encouraged to call the office with any questions, concerns, change in symptoms.  *x-ray next appointment.   Celesta Gentile, DPM

## 2015-08-15 ENCOUNTER — Emergency Department (HOSPITAL_COMMUNITY)
Admission: EM | Admit: 2015-08-15 | Discharge: 2015-08-15 | Disposition: A | Discharge status: Home / Self Care | Payer: Medicare Other | Attending: Emergency Medicine | Admitting: Emergency Medicine

## 2015-08-15 ENCOUNTER — Emergency Department (HOSPITAL_COMMUNITY): Payer: Medicare Other

## 2015-08-15 ENCOUNTER — Encounter (HOSPITAL_COMMUNITY): Payer: Self-pay

## 2015-08-15 DIAGNOSIS — E039 Hypothyroidism, unspecified: Secondary | ICD-10-CM | POA: Insufficient documentation

## 2015-08-15 DIAGNOSIS — Z992 Dependence on renal dialysis: Secondary | ICD-10-CM | POA: Insufficient documentation

## 2015-08-15 DIAGNOSIS — Z79899 Other long term (current) drug therapy: Secondary | ICD-10-CM | POA: Diagnosis not present

## 2015-08-15 DIAGNOSIS — J40 Bronchitis, not specified as acute or chronic: Secondary | ICD-10-CM | POA: Insufficient documentation

## 2015-08-15 DIAGNOSIS — Z85850 Personal history of malignant neoplasm of thyroid: Secondary | ICD-10-CM | POA: Diagnosis not present

## 2015-08-15 DIAGNOSIS — I120 Hypertensive chronic kidney disease with stage 5 chronic kidney disease or end stage renal disease: Secondary | ICD-10-CM | POA: Insufficient documentation

## 2015-08-15 DIAGNOSIS — Z95810 Presence of automatic (implantable) cardiac defibrillator: Secondary | ICD-10-CM | POA: Insufficient documentation

## 2015-08-15 DIAGNOSIS — N186 End stage renal disease: Secondary | ICD-10-CM | POA: Insufficient documentation

## 2015-08-15 DIAGNOSIS — Z940 Kidney transplant status: Secondary | ICD-10-CM | POA: Insufficient documentation

## 2015-08-15 DIAGNOSIS — I4891 Unspecified atrial fibrillation: Secondary | ICD-10-CM | POA: Diagnosis not present

## 2015-08-15 DIAGNOSIS — Z7901 Long term (current) use of anticoagulants: Secondary | ICD-10-CM | POA: Diagnosis not present

## 2015-08-15 DIAGNOSIS — R05 Cough: Secondary | ICD-10-CM | POA: Diagnosis present

## 2015-08-15 DIAGNOSIS — R011 Cardiac murmur, unspecified: Secondary | ICD-10-CM | POA: Diagnosis not present

## 2015-08-15 DIAGNOSIS — Z792 Long term (current) use of antibiotics: Secondary | ICD-10-CM | POA: Diagnosis not present

## 2015-08-15 DIAGNOSIS — Z7951 Long term (current) use of inhaled steroids: Secondary | ICD-10-CM | POA: Insufficient documentation

## 2015-08-15 MED ORDER — ALBUTEROL SULFATE HFA 108 (90 BASE) MCG/ACT IN AERS
2.0000 | INHALATION_SPRAY | RESPIRATORY_TRACT | Status: DC | PRN
  Administered 2015-08-15: 2 via RESPIRATORY_TRACT
  Filled 2015-08-15: qty 6.7

## 2015-08-15 MED ORDER — GUAIFENESIN 100 MG/5ML PO SOLN
5.0000 mL | Freq: Once | ORAL | Status: DC
  Filled 2015-08-15: qty 5

## 2015-08-15 MED ORDER — GUAIFENESIN 100 MG/5ML PO SOLN: 5.0000 mL | Freq: Once | ORAL | Status: DC

## 2015-08-15 MED ORDER — PROMETHAZINE-PHENYLEPHRINE 6.25-5 MG/5ML PO SYRP: 5.0000 mL | ORAL_SOLUTION | ORAL | Status: DC | PRN

## 2015-08-15 MED ORDER — HYDROCOD POLST-CPM POLST ER 10-8 MG/5ML PO SUER
5.0000 mL | Freq: Once | ORAL | Status: DC
  Filled 2015-08-15: qty 5

## 2015-08-15 MED ORDER — IPRATROPIUM-ALBUTEROL 0.5-2.5 (3) MG/3ML IN SOLN
3.0000 mL | Freq: Once | RESPIRATORY_TRACT | Status: AC
  Administered 2015-08-15: 3 mL via RESPIRATORY_TRACT
  Filled 2015-08-15: qty 3

## 2015-08-15 MED ORDER — PREDNISONE 10 MG PO TABS: 20.0000 mg | ORAL_TABLET | Freq: Every day | ORAL | Status: DC

## 2015-08-15 NOTE — ED Notes (Signed)
Pt did not want his BP rechecked.  NP aware of BP and pt's chronic hx of low BP.  Patient states that his normal BP and he is ready to go home.  Pt fully alert and oriented and discharge, gait steady and even, patient denies dizziness, able to ambulate independently

## 2015-08-15 NOTE — Discharge Instructions (Signed)
How to Use an Inhaler Proper inhaler technique is very important. Good technique ensures that the medicine reaches the lungs. Poor technique results in depositing the medicine on the tongue and back of the throat rather than in the airways. If you do not use the inhaler with good technique, the medicine will not help you. STEPS TO FOLLOW IF USING AN INHALER WITHOUT AN EXTENSION TUBE 1. Remove the cap from the inhaler. 2. If you are using the inhaler for the first time, you will need to prime it. Shake the inhaler for 5 seconds and release four puffs into the air, away from your face. Ask your health care provider or pharmacist if you have questions about priming your inhaler. 3. Shake the inhaler for 5 seconds before each breath in (inhalation). 4. Position the inhaler so that the top of the canister faces up. 5. Put your index finger on the top of the medicine canister. Your thumb supports the bottom of the inhaler. 6. Open your mouth. 7. Either place the inhaler between your teeth and place your lips tightly around the mouthpiece, or hold the inhaler 1-2 inches away from your open mouth. If you are unsure of which technique to use, ask your health care provider. 8. Breathe out (exhale) normally and as completely as possible. 9. Press the canister down with your index finger to release the medicine. 10. At the same time as the canister is pressed, inhale deeply and slowly until your lungs are completely filled. This should take 4-6 seconds. Keep your tongue down. 11. Hold the medicine in your lungs for 5-10 seconds (10 seconds is best). This helps the medicine get into the small airways of your lungs. 12. Breathe out slowly, through pursed lips. Whistling is an example of pursed lips. 13. Wait at least 15-30 seconds between puffs. Continue with the above steps until you have taken the number of puffs your health care provider has ordered. Do not use the inhaler more than your health care provider  tells you. 14. Replace the cap on the inhaler. 15. Follow the directions from your health care provider or the inhaler insert for cleaning the inhaler. STEPS TO FOLLOW IF USING AN INHALER WITH AN EXTENSION (SPACER) 1. Remove the cap from the inhaler. 2. If you are using the inhaler for the first time, you will need to prime it. Shake the inhaler for 5 seconds and release four puffs into the air, away from your face. Ask your health care provider or pharmacist if you have questions about priming your inhaler. 3. Shake the inhaler for 5 seconds before each breath in (inhalation). 4. Place the open end of the spacer onto the mouthpiece of the inhaler. 5. Position the inhaler so that the top of the canister faces up and the spacer mouthpiece faces you. 6. Put your index finger on the top of the medicine canister. Your thumb supports the bottom of the inhaler and the spacer. 7. Breathe out (exhale) normally and as completely as possible. 8. Immediately after exhaling, place the spacer between your teeth and into your mouth. Close your lips tightly around the spacer. 9. Press the canister down with your index finger to release the medicine. 10. At the same time as the canister is pressed, inhale deeply and slowly until your lungs are completely filled. This should take 4-6 seconds. Keep your tongue down and out of the way. 11. Hold the medicine in your lungs for 5-10 seconds (10 seconds is best). This helps the  medicine get into the small airways of your lungs. Exhale. 12. Repeat inhaling deeply through the spacer mouthpiece. Again hold that breath for up to 10 seconds (10 seconds is best). Exhale slowly. If it is difficult to take this second deep breath through the spacer, breathe normally several times through the spacer. Remove the spacer from your mouth. 13. Wait at least 15-30 seconds between puffs. Continue with the above steps until you have taken the number of puffs your health care provider has  ordered. Do not use the inhaler more than your health care provider tells you. 14. Remove the spacer from the inhaler, and place the cap on the inhaler. 15. Follow the directions from your health care provider or the inhaler insert for cleaning the inhaler and spacer. If you are using different kinds of inhalers, use your quick relief medicine to open the airways 10-15 minutes before using a steroid if instructed to do so by your health care provider. If you are unsure which inhalers to use and the order of using them, ask your health care provider, nurse, or respiratory therapist. If you are using a steroid inhaler, always rinse your mouth with water after your last puff, then gargle and spit out the water. Do not swallow the water. AVOID:  Inhaling before or after starting the spray of medicine. It takes practice to coordinate your breathing with triggering the spray.  Inhaling through the nose (rather than the mouth) when triggering the spray. HOW TO DETERMINE IF YOUR INHALER IS FULL OR NEARLY EMPTY You cannot know when an inhaler is empty by shaking it. A few inhalers are now being made with dose counters. Ask your health care provider for a prescription that has a dose counter if you feel you need that extra help. If your inhaler does not have a counter, ask your health care provider to help you determine the date you need to refill your inhaler. Write the refill date on a calendar or your inhaler canister. Refill your inhaler 7-10 days before it runs out. Be sure to keep an adequate supply of medicine. This includes making sure it is not expired, and that you have a spare inhaler.  SEEK MEDICAL CARE IF:   Your symptoms are only partially relieved with your inhaler.  You are having trouble using your inhaler.  You have some increase in phlegm. SEEK IMMEDIATE MEDICAL CARE IF:   You feel little or no relief with your inhalers. You are still wheezing and are feeling shortness of breath or  tightness in your chest or both.  You have dizziness, headaches, or a fast heart rate.  You have chills, fever, or night sweats.  You have a noticeable increase in phlegm production, or there is blood in the phlegm. MAKE SURE YOU:   Understand these instructions.  Will watch your condition.  Will get help right away if you are not doing well or get worse.   This information is not intended to replace advice given to you by your health care provider. Make sure you discuss any questions you have with your health care provider.   Document Released: 04/25/2000 Document Revised: 02/16/2013 Document Reviewed: 11/25/2012 Elsevier Interactive Patient Education [employer].

## 2015-08-15 NOTE — ED Notes (Signed)
Patient here with dry cough x 3 weeks, denies any associated symptoms with same. Non-smoker. No distress

## 2015-08-15 NOTE — ED Provider Notes (Signed)
CSN: [id number]     Arrival date & time 08/15/15  1840 History  By signing my name below, I, Nicole Kindred, attest that this documentation has been prepared under the direction and in the presence of [place], NP  Electronically Signed: Nicole Kindred, ED Scribe 08/16/2015 at 6:58 PM.    Chief Complaint  Patient presents with  . Cough     Patient is a 55 y.o. male presenting with cough. The history is provided by the patient. No language interpreter was used.  Cough Cough characteristics:  Non-productive Severity:  Moderate Onset quality:  Gradual Duration:  2 weeks Timing:  Unable to specify Progression:  Unchanged Chronicity:  New Smoker: no   Worsened by:  Nothing tried Ineffective treatments:  None tried Associated symptoms: sinus congestion   Associated symptoms: no chills, no ear pain, no fever, no sore throat and no wheezing    HPI Comments: Adam [name] is a 55 y.o. male who presents to the Emergency Department complaining of gradual onset, non-productive cough, ongoing for three weeks. Pt reports associated chest congestion and sinus congestion. No other associated symptoms noted. Pt had a "bug" that has alleviated but his cough and congestion have remained unchanged. He has had positive sick contact with his family. No worsening or alleviating factors noted. Pt denies wheezing, ear pain, sore throat, fever, chills, nausea, vomiting, or any other pertinent symptoms   Past Medical History  Diagnosis Date  . Atrial fibrillation ([place])   . Nonischemic cardiomyopathy ([place])   . ICD (implantable cardioverter-defibrillator) in place     [employer]   . Renal failure     Dialysis  . HTN (hypertension)   . Thyroid cancer ([place]) 2011  . Pulmonary HTN ([place])   . 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 Hypothyroidism (acquired)    Past Surgical History  Procedure Laterality Date  . Thyroidectomy    . Dialysis fistula creation     . Kidney transplant    . Laparoscopic gastric banding    . Toe amputations    . Hernia repair    . Revison of arteriovenous fistula Left [id number]    Procedure: PLICATION OF ARTERIOVENOUS FISTULA ANEURYSM;  Surgeon: Elam Dutch, MD;  Location: [facility];  Service: Vascular;  Laterality: Left;  . Thrombectomy w/ embolectomy Left 12/25/2014    Procedure: THROMBECTOMY ARTERIOVENOUS FISTULA;  Surgeon: Elam Dutch, MD;  [facility];  Service: Vascular;  Laterality: Left;   Family History  Problem Relation Age of Onset  . Hypertension Father   . Heart failure Mother   . Kidney failure Father   . Hypertension Mother   . CAD Mother 30  . Emphysema Mother    Social History  Substance Use Topics  . Smoking status: Never Smoker   . Smokeless tobacco: Never Used  . Alcohol Use: No    Review of Systems  Constitutional: Negative for fever and chills.  HENT: Positive for congestion. Negative for ear pain and sore throat.   Respiratory: Positive for cough. Negative for wheezing.   Gastrointestinal: Negative for nausea and vomiting.    Allergies  Enalaprilat; Ivp dye; and Vasotec  Home Medications   Prior to Admission medications   Medication Sig Start Date End Date Taking? Authorizing Provider  ambrisentan (LETAIRIS) 10 MG tablet Take 1 tablet (10 mg total) by mouth daily. 11/09/14   Juanito Doom, MD  amiodarone (PACERONE) 200 MG tablet Take  200 mg by mouth daily.    Historical Provider, MD  atorvastatin (LIPITOR) 40 MG tablet Take 1 tablet (40 mg total) by mouth daily. 05/24/15   [name], MD  benzonatate (TESSALON) 200 MG capsule Take 1 capsule (200 mg total) by mouth 2 (two) times daily as needed for cough. 08/10/15   Mariel Aloe, MD  calcium acetate (PHOSLO) 667 MG capsule Take by mouth. 04/21/15   Historical Provider, MD  cephALEXin (KEFLEX) 500 MG capsule Take 1 capsule (500 mg total) by mouth 2 (two) times daily. 08/13/15   Trula Slade, DPM  cinacalcet  (SENSIPAR) 90 MG tablet Take 90 mg by mouth daily.    Historical Provider, MD  fluticasone (FLONASE) 50 MCG/ACT nasal spray Place 2 sprays into both nostrils daily. 08/06/15   [name], MD  levothyroxine (SYNTHROID, LEVOTHROID) 112 MCG tablet Take 112 mcg by mouth 2 (two) times daily.    Historical Provider, MD  midodrine (PROAMATINE) 10 MG tablet Take 10 mg by mouth 3 (three) times daily.    Historical Provider, MD  predniSONE (DELTASONE) 10 MG tablet Take 2 tablets (20 mg total) by mouth daily. 08/15/15   Hope Bunnie Pion, NP  promethazine-phenylephrine (PROMETHAZINE VC) 6.25-5 MG/5ML SYRP Take 5 mLs by mouth every 4 (four) hours as needed for congestion (cough). 08/15/15   Hope Bunnie Pion, NP  Pseudoephedrine-Guaifenesin (MUCINEX D) [phone] MG TB12 Take 1 tablet by mouth every 12 (twelve) hours. 08/06/15   [name], MD  Tadalafil, PAH, (ADCIRCA) 20 MG TABS 1 daily on dialysis days, and 2 daily on other days 08/09/15   Juanito Doom, MD  warfarin (COUMADIN) 6 MG tablet Take 1 to 1.5 tablets by mouth daily as directed by coumadin clinic You may resume coumadin tomorrow 12/26/14 12/25/14   Alvia Grove, PA-C  warfarin (COUMADIN) 6 MG tablet TAKE 1 TO 1.5 TABLETS BY MOUTH DAILY AS DIRECTED BY COUMADIN CLINIC 03/27/15   Minus Breeding, MD   BP 98/66 mmHg  Pulse 78  Temp(Src) 97.9 F (36.6 C)  Resp 20  Ht 6\' 1"  (1.854 m)  Wt 270 lb (122.471 kg)  BMI 35.63 kg/m2  SpO2 97% Physical Exam  Constitutional: He is oriented to person, place, and time. He appears well-developed and well-nourished. No distress.  HENT:  Head: Normocephalic.  Right Ear: External ear normal.  Left Ear: External ear normal.  Mouth/Throat: Oropharynx is clear and moist.  Eyes: Conjunctivae and EOM are normal.  Neck: Normal range of motion. Neck supple.  Cardiovascular: Normal rate, regular rhythm and normal heart sounds.  Exam reveals no gallop.   No murmur heard. Heart murmur noted.   Pulmonary/Chest: Effort  normal and breath sounds normal. No respiratory distress. He has no wheezes. He has no rales.  Musculoskeletal: Normal range of motion.  Neurological: He is alert and oriented to person, place, and time.  Skin: Skin is warm and dry.  Psychiatric: He has a normal mood and affect.  Nursing note and vitals reviewed.   ED Course  Procedures (including critical care time) DIAGNOSTIC STUDIES: Oxygen Saturation is 97% on RA, normal by my interpretation.    COORDINATION OF CARE: 8:40 PM-Discussed treatment plan which includes CXR, and duoneb with pt at bedside and pt agreed to plan.    Labs Review Labs Reviewed - No data to display  Imaging Review Dg Chest 2 View  08/15/2015  CLINICAL DATA:  Productive cough. EXAM: CHEST  2 VIEW COMPARISON:  12/24/2014  FINDINGS: Heart size and pulmonary vascularity are normal and the lungs are clear. AICD in place. Aortic atherosclerosis. Vascular stent in the left axilla. IMPRESSION: No acute abnormalities. Electronically Signed   By: Lorriane Shire M.D.   On: 08/15/2015 19:32   I have personally reviewed and evaluated these images as part of my medical decision-making.   MDM  55 y.o. male with persistent dry cough after having flu like symptoms 2 weeks ago stable for d/c with improvement after neb treatment and no acute findings on CXR. No respiratory distress and O2 SAT 97% on R/A. Will treat with short course of steroids and patient will f/u with his PCP in the next couple days. He will return here as needed for worsening symptoms.  Final diagnoses:  Bronchitis   I personally performed the services described in this documentation, which was scribed in my presence. The recorded information has been reviewed and is accurate.    [place], NP 08/16/15 [place], MD 08/18/15 0700

## 2015-08-20 ENCOUNTER — Ambulatory Visit (INDEPENDENT_AMBULATORY_CARE_PROVIDER_SITE_OTHER): Payer: Medicare Other | Admitting: Podiatry

## 2015-08-20 ENCOUNTER — Telehealth: Payer: Self-pay

## 2015-08-20 ENCOUNTER — Encounter: Payer: Self-pay | Admitting: Podiatry

## 2015-08-20 VITALS — BP 152/68 | HR 62 | Resp 12

## 2015-08-20 DIAGNOSIS — I6523 Occlusion and stenosis of bilateral carotid arteries: Secondary | ICD-10-CM

## 2015-08-20 DIAGNOSIS — L02619 Cutaneous abscess of unspecified foot: Secondary | ICD-10-CM

## 2015-08-20 DIAGNOSIS — L03119 Cellulitis of unspecified part of limb: Secondary | ICD-10-CM

## 2015-08-20 DIAGNOSIS — L97512 Non-pressure chronic ulcer of other part of right foot with fat layer exposed: Secondary | ICD-10-CM

## 2015-08-20 NOTE — Progress Notes (Signed)
Patient ID: Adam [name], male   DOB: 05-Jun-1960, 55 y.o.   MRN: [id number]  Subjective: 55 year old male presents the office today for follow-up evaluation of wound of the top of the right foot. He states that since last alignment the wound has healed. He has continue antibiotic. Denies any redness or swelling or any warmth to the foot. Denies any recent injury or trauma. No pain. Denies any systemic complaints such as fevers, chills, nausea, vomiting. No calf pain, chest pain, shortness of breath. No other complaints at this time.  Objective: AAO 3, NAD DP/PT pulses decreased; unchanged Protective sensation decreased with Simms Weinstein monofilament On the right foot on the dorsal aspect of the third MTPJ/interspace over the area the previous wound is a hyperkeratotic lesion. Upon debridement there is no underlying ulceration, drainage or other signs of infection. The wound did appear to be healed. There is localized edema to this area there any significant erythema or increase in warmth. Hyperkeratotic lesion right hallux. Upon debridement no underlying ulceration, drainage or other signs of infection. No other open lesions or pre-ulcerative lesions identified. There is no pain with calf compression, swelling, warmth, erythema.  Assessment: 55 year old male ulceration right foot with resolving infection.    Plan: -Treatment options discussed including all alternatives, risks, and complications -Etiology of symptoms were discussed -Will get new x-rays next week to evaluate for any change or also myelitis. -Hyperkeratotic lesion was debrided to without, occasions or bleeding. The wound appears be healed. There is no drainage or pus upon expression. Continue to monitor area very closely. Finish course of antibiotics. I will see him back in 1 week check however there is any signs or symptoms of worsening infection to call the office or go directly to the emergency room. -Follow-up in 1 week or  sooner if any problems arise. In the meantime, encouraged to call the office with any questions, concerns, change in symptoms.  *x-ray next appointment!!!!!  Celesta Gentile, DPM

## 2015-08-20 NOTE — Telephone Encounter (Signed)
Opened in error

## 2015-08-27 ENCOUNTER — Ambulatory Visit: Payer: Medicare Other | Admitting: Podiatry

## 2015-09-03 ENCOUNTER — Ambulatory Visit (INDEPENDENT_AMBULATORY_CARE_PROVIDER_SITE_OTHER): Payer: Medicare Other

## 2015-09-03 ENCOUNTER — Telehealth: Payer: Self-pay | Admitting: *Deleted

## 2015-09-03 ENCOUNTER — Encounter: Payer: Self-pay | Admitting: Podiatry

## 2015-09-03 ENCOUNTER — Ambulatory Visit (INDEPENDENT_AMBULATORY_CARE_PROVIDER_SITE_OTHER): Payer: Medicare Other | Admitting: Podiatry

## 2015-09-03 DIAGNOSIS — L03119 Cellulitis of unspecified part of limb: Principal | ICD-10-CM

## 2015-09-03 DIAGNOSIS — R52 Pain, unspecified: Secondary | ICD-10-CM

## 2015-09-03 DIAGNOSIS — L02619 Cutaneous abscess of unspecified foot: Secondary | ICD-10-CM

## 2015-09-03 DIAGNOSIS — M86671 Other chronic osteomyelitis, right ankle and foot: Secondary | ICD-10-CM | POA: Diagnosis not present

## 2015-09-03 DIAGNOSIS — L97512 Non-pressure chronic ulcer of other part of right foot with fat layer exposed: Secondary | ICD-10-CM

## 2015-09-03 DIAGNOSIS — I6523 Occlusion and stenosis of bilateral carotid arteries: Secondary | ICD-10-CM | POA: Diagnosis not present

## 2015-09-03 MED ORDER — CEPHALEXIN 500 MG PO CAPS: 500.0000 mg | ORAL_CAPSULE | Freq: Two times a day (BID) | ORAL | Status: DC

## 2015-09-03 NOTE — Telephone Encounter (Addendum)
Orders for MRI right foot put in EPIC and given to [facility] for prior authorizing.  09/04/2015-[employer] [place], NO PRE-CERT IS REQUIRED FOR MRI [address]9629 RIGHT FOOT, CASE# [id number]. 09/05/2015-[facility] states pt has defibrillator and can not have MRI. Dr. Jacqualyn Posey changed orders to CT Scan of right foot suspect Osteomyelitis.  Orders for CT in EPIC and given to D. [place] for [employer].

## 2015-09-03 NOTE — Patient Instructions (Signed)
For your knee contact [employer]  [phone]

## 2015-09-04 NOTE — Progress Notes (Signed)
Patient ID: Adam [name], male   DOB: 1960/11/11, 55 y.o.   MRN: [id number]  Subjective: 55 year old male presents the office today for follow-up evaluation of wound of the top of the right foot. He states that he is doing better and he has not is a drainage. There is been some small amount of swelling to the right foot however it has improved compared to last appointment. No redness or warmth. He is currently not taking antibiotics. Denies any systemic complaints such as fevers, chills, nausea, vomiting. No calf pain, chest pain, shortness of breath. No other complaints at this time.  Objective: AAO 3, NAD DP/PT pulses decreased; unchanged Protective sensation decreased with Simms Weinstein monofilament On the right foot on the dorsal aspect of the third MTPJ/interspace over the area the previous wound is a hyperkeratotic lesion. Upon debridement there is a small amount of serosangenous drainage but no pus. No underlying ulceration or other signs of infection. The wound did appear to be healed. There is localized edema to this area there any significant erythema or increase in warmth. The edema has improved. Hyperkeratotic lesion right hallux. Upon debridement no underlying ulceration, drainage or other signs of infection. No other open lesions or pre-ulcerative lesions identified. There is no pain with calf compression, swelling, warmth, erythema.  Assessment: 55 year old male ulceration right foot with resolving infection with concern of OM on x-ray   Plan: -Treatment options discussed including all alternatives, risks, and complications -X-rays were obtained and reviewed. There is questionable cortical changes suggestive of osteomyelitis of the third digit metatarsal. Because of this I do recommend MRI to further evaluate. There is no soft tissue emphysema. The wound today was debrided without complications or bleeding. A small amount of serous significant degenerative distress. Symptomatic  ointment was applied. There is no probing however still concerned about possible bone infection. We'll continue antibiotics for now. On Keflex. -Monitor for any clinical signs or symptoms of infection and directed to call the office immediately should any occur or go to the ER.  -Follow-up after MRI or sooner if any issues are to arise.  Celesta Gentile, DPM

## 2015-09-05 ENCOUNTER — Other Ambulatory Visit: Payer: Self-pay | Admitting: Cardiology

## 2015-09-05 LAB — POCT INR: INR: 1.98

## 2015-09-07 ENCOUNTER — Ambulatory Visit (INDEPENDENT_AMBULATORY_CARE_PROVIDER_SITE_OTHER): Payer: Medicare Other | Admitting: Pharmacist

## 2015-09-07 DIAGNOSIS — Z7901 Long term (current) use of anticoagulants: Secondary | ICD-10-CM

## 2015-09-07 DIAGNOSIS — I4891 Unspecified atrial fibrillation: Secondary | ICD-10-CM

## 2015-09-10 ENCOUNTER — Ambulatory Visit: Payer: Medicare Other | Admitting: Family Medicine

## 2015-09-13 ENCOUNTER — Ambulatory Visit
Admission: RE | Admit: 2015-09-13 | Discharge: 2015-09-13 | Disposition: A | Discharge status: Home / Self Care | Payer: Medicare Other | Source: Ambulatory Visit | Attending: Podiatry | Admitting: Podiatry

## 2015-09-13 ENCOUNTER — Encounter: Payer: Self-pay | Admitting: Cardiology

## 2015-09-13 ENCOUNTER — Encounter: Payer: Self-pay | Admitting: Cardiovascular Disease

## 2015-09-13 ENCOUNTER — Telehealth: Payer: Self-pay

## 2015-09-13 ENCOUNTER — Ambulatory Visit: Payer: Medicare Other | Admitting: *Deleted

## 2015-09-13 ENCOUNTER — Ambulatory Visit (INDEPENDENT_AMBULATORY_CARE_PROVIDER_SITE_OTHER): Payer: Medicare Other | Admitting: Cardiovascular Disease

## 2015-09-13 ENCOUNTER — Ambulatory Visit (INDEPENDENT_AMBULATORY_CARE_PROVIDER_SITE_OTHER): Payer: Medicare Other | Admitting: Cardiology

## 2015-09-13 VITALS — BP 120/68 | HR 77 | Ht 73.0 in | Wt 275.0 lb

## 2015-09-13 DIAGNOSIS — I4891 Unspecified atrial fibrillation: Secondary | ICD-10-CM

## 2015-09-13 DIAGNOSIS — L03119 Cellulitis of unspecified part of limb: Principal | ICD-10-CM

## 2015-09-13 DIAGNOSIS — I6523 Occlusion and stenosis of bilateral carotid arteries: Secondary | ICD-10-CM | POA: Diagnosis not present

## 2015-09-13 DIAGNOSIS — L02619 Cutaneous abscess of unspecified foot: Secondary | ICD-10-CM

## 2015-09-13 DIAGNOSIS — L97512 Non-pressure chronic ulcer of other part of right foot with fat layer exposed: Secondary | ICD-10-CM

## 2015-09-13 DIAGNOSIS — Z95810 Presence of automatic (implantable) cardiac defibrillator: Secondary | ICD-10-CM

## 2015-09-13 MED ORDER — MIDODRINE HCL 10 MG PO TABS: 10.0000 mg | ORAL_TABLET | Freq: Three times a day (TID) | ORAL | Status: DC

## 2015-09-13 NOTE — Progress Notes (Signed)
Patient ID: Adam [name], male   DOB: 30-Jul-1960, 55 y.o.   MRN: [id number]  He had an appointment with Dr. Percival Spanish today, so we did a pacemaker download and rescheduled his office visit with me in 3 months. Normal device function. Optivol has been consistent with fluid overload for about a month now, but he denies edema or dyspnea. Volume is controlled via dialysis only.  He has had few episodes of atrial fibrillation with controlled rate, the longest only 30 minutes, asymptomatic. Overall burden of AF is < 1%. He is on warfarin anticoagulation. No episodes of VT seen. Will enroll in ICM clinic to monitor fluid load, to make sure it is indeed returning to baseline, as the trend suggests.

## 2015-09-13 NOTE — Telephone Encounter (Signed)
Referred to [facility] clinic by Dr Sallyanne Kuster.  Called patient and explained ICM program and he agreed to monthly follow up.  Advised Dr Sallyanne Kuster would like the next ICM remote transmission in approximately 2 weeks and is scheduled for 10/01/2015.   He reported he goes to Dialysis Monday, Wednesday and Friday.

## 2015-09-13 NOTE — Progress Notes (Signed)
HPI The patient presents as a new patient. He moved here from [place]. He has end-stage renal disease secondary to uncontrolled hypertension and apparent glomerulonephritis.   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 He is on dialysis and actually tolerates this well. Despite hypotension he says he does not have to stop dialysis. He is exercising on a bike although he has been limited by knee pain.  . With this he denies any cardiovascular symptoms. He chronically sleeps on 2 pillows. He doesn't have shortness of breath, PND or orthopnea. He has no weight gain or edema. He has no palpitations, presyncope or syncope.  Since I last saw him he was at [facility] for part of his transplant evaluation. Reviewed these records. Echocardiogram demonstrated well-preserved ejection fraction with some moderate tricuspid regurgitation. He had a right heart catheterization and his pulmonary pressures were actually quite good with his pulmonary vascular resistance being 2.2 Woods units.    Perfusion study was negative for evidence of chronic embolism.   Allergies  Allergen Reactions  . Enalaprilat Swelling  . Ivp Dye [Iodinated Diagnostic Agents] Nausea And Vomiting  . Vasotec [Enalapril] Swelling    Current Outpatient Prescriptions  Medication Sig Dispense Refill  . ambrisentan (LETAIRIS) 10 MG tablet Take 1 tablet (10 mg total) by mouth daily. 30 tablet 2  . amiodarone (PACERONE) 200 MG tablet Take 200 mg by mouth daily.    Marland Kitchen atorvastatin (LIPITOR) 40 MG tablet Take 1 tablet (40 mg total) by mouth daily. 90 tablet 3  . calcium acetate (PHOSLO) 667 MG capsule Take by mouth.    . cinacalcet (SENSIPAR) 90 MG tablet Take 90 mg by mouth daily.    . fluticasone (FLONASE) 50 MCG/ACT nasal spray Place 2 sprays into both nostrils daily. 16 g 6  . levothyroxine (SYNTHROID, LEVOTHROID) 112 MCG tablet Take 112 mcg by mouth 2 (two) times daily.    . midodrine (PROAMATINE)  10 MG tablet Take 10 mg by mouth 3 (three) times daily.    . Pseudoephedrine-Guaifenesin (MUCINEX D) [phone] MG TB12 Take 1 tablet by mouth every 12 (twelve) hours. 20 each 0  . Tadalafil, PAH, (ADCIRCA) 20 MG TABS 1 daily on dialysis days, and 2 daily on other days 60 tablet 5  . warfarin (COUMADIN) 6 MG tablet TAKE 1 TO 1.5 TABLETS BY MOUTH DAILY AS DIRECTED BY COUMADIN CLINIC 50 tablet 3   No current facility-administered medications for this visit.    Past Medical History  Diagnosis Date  . Atrial fibrillation ([place])   . Nonischemic cardiomyopathy ([place])   . ICD (implantable cardioverter-defibrillator) in place     Medtronic   . Renal failure     Dialysis  . HTN (hypertension)   . Thyroid cancer ([place]) 2011  . Pulmonary HTN ([place])   . 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 Hypothyroidism (acquired)     Past Surgical History  Procedure Laterality Date  . Thyroidectomy    . Dialysis fistula creation    . Kidney transplant    . Laparoscopic gastric banding    . Toe amputations    . Hernia repair    . Revison of arteriovenous fistula Left [id number]    Procedure: PLICATION OF ARTERIOVENOUS FISTULA ANEURYSM;  Surgeon: Elam Dutch, MD;  Location: [place];  Service: Vascular;  Laterality: Left;  . Thrombectomy w/ embolectomy Left 12/25/2014    Procedure: THROMBECTOMY  ARTERIOVENOUS FISTULA;  Surgeon: Elam Dutch, MD;  Location: [place];  Service: Vascular;  Laterality: Left;    ROS:  As stated in the HPI and negative for all other systems.  PHYSICAL EXAM BP 120/68 mmHg  Pulse 77  Ht 6\' 1"  (1.854 m)  Wt 275 lb (124.739 kg)  BMI 36.29 kg/m2  GENERAL:  Well appearing NECK:  No jugular venous distention, waveform within normal limits, carotid upstroke brisk and symmetric, no bruits, no thyromegaly LUNGS:  Clear to auscultation bilaterally BACK:  No CVA tenderness CHEST:  ICD pocket left side HEART:  PMI not displaced or  sustained,S1 and S2 within normal limits, no S3, no S4, no clicks, no rubs, 2/6 Aholosystolic murmur her in particular it is left upper and right upper sternal borders, no diastolic murmurs ABD:  Flat, positive bowel sounds normal in frequency in pitch, no bruits, no rebound, no guarding, no midline pulsatile mass, no hepatomegaly, no splenomegaly EXT:  2 plus pulses throughout, no edema, no cyanosis no clubbing dialysis fistula left upper arm,   EKG:  Sinus rhythm, rate 71, axis within normal limits, intervals within normal limits, low voltage in the mid precordial leads, borderline QT prolongation, no acute ST-T wave changes.  09/13/2015   ASSESSMENT AND PLAN  CARDIOMYOPATHY:    He seems to be euvolemic. I was able to review records from [place].  The EF is preserved.  He will continue on the meds as listed. .  ATRIAL FIB:  He has been maintaining NSR.  He is up to date with follow up of his TSH and liver enzymes.  He tolerates anticoagulation.  No change in therapy is indicated.   ICD:  He will have follow up of his ICD today.   PULMONARY HTN:  This seems to be well treated on the current meds.  No change in therapy is indicated.   HTN:  The blood pressure is low at times but responds to midodrine. . No change in medications is indicated. We will continue with therapeutic lifestyle changes (TLC).

## 2015-09-13 NOTE — Patient Instructions (Addendum)
Medication Instructions:  Continue all current medications   Labwork: NONE  Testing/Procedures: NONE  Follow-Up: Your physician wants you to follow-up in: 6 Months  You will receive a reminder letter in the mail two months in advance. If you don't receive a letter, please call our office to schedule the follow-up appointment.   Any Other Special Instructions Will Be Listed Below (If Applicable).   If you need a refill on your cardiac medications before your next appointment, please call your pharmacy.

## 2015-09-13 NOTE — Patient Instructions (Signed)
Dr Sallyanne Kuster recommends that you schedule a follow-up appointment in 3 months with a defibrillator check.

## 2015-09-14 NOTE — Progress Notes (Signed)
Remote ICD transmission.   

## 2015-09-17 ENCOUNTER — Encounter: Payer: Self-pay | Admitting: Podiatry

## 2015-09-17 ENCOUNTER — Ambulatory Visit (INDEPENDENT_AMBULATORY_CARE_PROVIDER_SITE_OTHER): Payer: Medicare Other | Admitting: Podiatry

## 2015-09-17 DIAGNOSIS — I96 Gangrene, not elsewhere classified: Secondary | ICD-10-CM

## 2015-09-17 DIAGNOSIS — L84 Corns and callosities: Secondary | ICD-10-CM

## 2015-09-17 DIAGNOSIS — I6523 Occlusion and stenosis of bilateral carotid arteries: Secondary | ICD-10-CM | POA: Diagnosis not present

## 2015-09-17 DIAGNOSIS — M86271 Subacute osteomyelitis, right ankle and foot: Secondary | ICD-10-CM | POA: Diagnosis not present

## 2015-09-17 DIAGNOSIS — I739 Peripheral vascular disease, unspecified: Secondary | ICD-10-CM

## 2015-09-19 LAB — POCT INR: INR: 2.44

## 2015-09-20 NOTE — Progress Notes (Signed)
Patient ID: Adam [name], male   DOB: 03-Mar-1961, 55 y.o.   MRN: [id number]  Subjective: 55 year old male presents the office today for follow-up evaluation of wound of the top of the right foot. Pieces of the wound is healed he has not had any swelling, redness, drainage to the right foot. He has not noticed any new open sore. On the bothersome on the right foot is a callus the fifth toe into the big toe. Denies any systemic complaints such as fevers, chills, nausea, vomiting. No calf pain, chest pain, shortness of breath. No other complaints at this time.  Objective: AAO 3, NAD DP/PT pulses decreased; unchanged Protective sensation decreased with Simms Weinstein monofilament On the right foot on the dorsal aspect of the third MTPJ there is no open lesion and there is no snapping hyperkeratotic lesion along the area of the previous ulcer. There is no drainage or pus. There is no edema, erythema, increase in warmth. There is no fluctuance or crepitus.  On th hyperkeratotic lesions present to the hallux into the fifth toe. Upon debridement there is no ongoing ulceration, drainage or other signs of infection. No open sores on the left side. Prior surgeries well-healed on the left. There is no pain with calf compression, swelling, warmth, erythema.  Assessment: 55 year old male ulceration right foot with concern for osteomyelitis however no signs of clinical infection of the wound is healed.    Plan: -Treatment options discussed including all alternatives, risks, and complications -CT scan results were discussed with the patient. There is concern for osteomyelitis on CT scan. At a long discussion with the patient regarding the treatment options. At this time since the wound is healed and there is no clinical signs of infection he has elected to monitor. Discussed with him signs and symptoms of worsening infection to go immediately to the emergency room should any occur. -Calluses right foot debrided  2. -I'll see him back in 2 weeks for repeat x-ray of the right foot or sooner if any issues are to arise.   Celesta Gentile, DPM

## 2015-09-24 ENCOUNTER — Ambulatory Visit (INDEPENDENT_AMBULATORY_CARE_PROVIDER_SITE_OTHER): Payer: Medicare Other | Admitting: Pharmacist

## 2015-09-24 DIAGNOSIS — Z7901 Long term (current) use of anticoagulants: Secondary | ICD-10-CM

## 2015-09-24 DIAGNOSIS — I4891 Unspecified atrial fibrillation: Secondary | ICD-10-CM

## 2015-09-29 LAB — CUP PACEART INCLINIC DEVICE CHECK
Battery Remaining Longevity: 121 mo
Battery Voltage: 3.02 V
Brady Statistic AP VP Percent: 0.05 %
Brady Statistic AP VS Percent: 0.76 %
Brady Statistic AS VS Percent: 98.94 %
HighPow Impedance: 42 Ohm
HighPow Impedance: 46 Ohm
Implantable Lead Implant Date: 20090109
Implantable Lead Location: 753859
Implantable Lead Location: 753860
Lead Channel Impedance Value: 570 Ohm
Lead Channel Impedance Value: 703 Ohm
Lead Channel Pacing Threshold Pulse Width: 0.4 ms
Lead Channel Pacing Threshold Pulse Width: 0.4 ms
Lead Channel Sensing Intrinsic Amplitude: 1.625 mV
Lead Channel Sensing Intrinsic Amplitude: 1.625 mV
Lead Channel Sensing Intrinsic Amplitude: 6.125 mV
MDC IDC LEAD IMPLANT DT: 20090109
MDC IDC MSMT LEADCHNL RA PACING THRESHOLD AMPLITUDE: 2.5 V
MDC IDC MSMT LEADCHNL RV IMPEDANCE VALUE: 304 Ohm
MDC IDC MSMT LEADCHNL RV PACING THRESHOLD AMPLITUDE: 1.75 V
MDC IDC MSMT LEADCHNL RV SENSING INTR AMPL: 6.125 mV
MDC IDC SESS DTM: 20170504120425
MDC IDC SET LEADCHNL RA PACING AMPLITUDE: 5 V
MDC IDC SET LEADCHNL RV PACING AMPLITUDE: 3.75 V
MDC IDC SET LEADCHNL RV PACING PULSEWIDTH: 0.4 ms
MDC IDC SET LEADCHNL RV SENSING SENSITIVITY: 0.3 mV
MDC IDC STAT BRADY AS VP PERCENT: 0.25 %
MDC IDC STAT BRADY RA PERCENT PACED: 0.81 %
MDC IDC STAT BRADY RV PERCENT PACED: 0.3 %

## 2015-10-01 ENCOUNTER — Ambulatory Visit (INDEPENDENT_AMBULATORY_CARE_PROVIDER_SITE_OTHER): Payer: Medicare Other

## 2015-10-01 ENCOUNTER — Telehealth: Payer: Self-pay

## 2015-10-01 ENCOUNTER — Encounter: Payer: Self-pay | Admitting: Podiatry

## 2015-10-01 ENCOUNTER — Ambulatory Visit (INDEPENDENT_AMBULATORY_CARE_PROVIDER_SITE_OTHER): Payer: Medicare Other | Admitting: Podiatry

## 2015-10-01 DIAGNOSIS — I429 Cardiomyopathy, unspecified: Secondary | ICD-10-CM | POA: Diagnosis not present

## 2015-10-01 DIAGNOSIS — I6523 Occlusion and stenosis of bilateral carotid arteries: Secondary | ICD-10-CM

## 2015-10-01 DIAGNOSIS — Z95810 Presence of automatic (implantable) cardiac defibrillator: Secondary | ICD-10-CM

## 2015-10-01 DIAGNOSIS — R52 Pain, unspecified: Secondary | ICD-10-CM

## 2015-10-01 DIAGNOSIS — I428 Other cardiomyopathies: Secondary | ICD-10-CM

## 2015-10-01 DIAGNOSIS — M86271 Subacute osteomyelitis, right ankle and foot: Secondary | ICD-10-CM

## 2015-10-01 NOTE — Progress Notes (Signed)
EPIC Encounter for ICM Monitoring  Patient Name: Adam [name] is a 55 y.o. male Date: 10/01/2015 Primary Care Physican: Dimas Chyle, MD Primary Cardiologist: Croitoru Electrophysiologist: Croitoru Dry Weight: 272.9 lbs      In the past month, have you:  1. Gained more than 2 pounds in a day or more than 5 pounds in a week? no  2. Had changes in your medications (with verification of current medications)? no  3. Had more shortness of breath than is usual for you? no  4. Limited your activity because of shortness of breath? no  5. Not been able to sleep because of shortness of breath? no  6. Had increased swelling in your feet, ankles, legs or stomach area? no  7. Had symptoms of dehydration (dizziness, dry mouth, increased thirst, decreased urine output) no  8. Had changes in sodium restriction? no  9. Been compliant with medication? Yes  ICM trend: 3 month view for 10/01/2015 post dialysis  ICM trend: 1 year view for 10/01/2015 post dialysis  Follow-up plan: [facility] clinic phone appointment 10/09/2015.   1st ICM encounter.  Patient has dialysis on Monday, Wednesday, Friday and 8.8 pounds was removed today.    FLUID LEVELS:  Optivol thoracic impedance has been decreased since ~08/01/2015 and fluid index above threshold since ~08/10/2015 suggesting fluid accumulation.    SYMPTOMS:  He reported when he has fluid overload, it is usually in stomach area and feels he may have a little at this time.   RECOMMENDATIONS: Advised will send to Dr Sallyanne Kuster for review regarding post dialysis transmission showing decreased thoracic impedance.     Rosalene Billings, RN, CCM 10/01/2015 2:31 PM

## 2015-10-01 NOTE — Progress Notes (Signed)
Patient ID: Adam [name], male   DOB: 07-Mar-1961, 55 y.o.   MRN: [id number]  Subjective: 55 year old male presents the office today for follow-up evaluation of possible osteomyelitis to his right foot. Since last appointment is continued antibiotic. He has not had any open sores or any drainage. No warmth or swelling to his foot. He states the left continues to do well. Denies any systemic complaints such as fevers, chills, nausea, vomiting. No calf pain, chest pain, shortness of breath. No other complaints at this time.  Objective: AAO 3, NAD DP/PT pulses decreased; unchanged Protective sensation decreased with Simms Weinstein monofilament There is no open lesion identified bilaterally. The previous amputations are well-healed. There is no edema, erythema, increase in warmth. There is no areas of options or crepitus. There is no malodor. There is no pain with calf compression, swelling, warmth, erythema.  Assessment: 55 year old male ulceration right foot with concern for osteomyelitis however no signs of clinical infection of the wound is healed.    Plan: -Treatment options discussed including all alternatives, risks, and complications -X-rays were obtained and reviewed with the patient. Previous amputation sites are well healed. And third metatarsal head as well as the proximal filling space there is concern for also myelitis however compared to previous x-rays the bone appears to be healing. There is no clinical signs of infection. There is no edema, erythema, increase in warmth or open lesion. Finished his course of antibiotics. I'll see him back in 3 weeks for repeat x-rays. In the meantime of this a signs or symptoms of worsening infection to call the office immediately for which she verbally understood.  Celesta Gentile, DPM

## 2015-10-01 NOTE — Telephone Encounter (Signed)
Remote ICM transmission received.  Spoke with patient and requested updated transmission since the one received today was before his dialysis appointment.  He stated he would send when he arrives home.

## 2015-10-02 NOTE — Progress Notes (Signed)
From: Sanda Klein, MD   Sent: 10/02/2015  9:55 AM    To: Dionne Bucy Truitt, CMA, Rosalene Billings, RN  Subject: RE: Leanne Lovely reading                The best way to do it is too get in touch with his nephrologist and tell him about the findings and have them challenge his dry weight on dialysis. I'll take care of that. Thanks for let me know.  Mihai

## 2015-10-02 NOTE — Progress Notes (Signed)
Call to patient to explained will notify nephrologist of fluid levels after receiving ICM transmission so they may adjust his dry weight if needed.   He reported his nephrologist is Dr Colman Cater, [phone].  Scheduled ICM transmission every week x 4 weeks or until fluid levels improve.  Dates of transmission 10/09/2015, 10/16/2015, 10/23/2015, 10/30/2015.

## 2015-10-03 LAB — POCT INR: INR: 2.14

## 2015-10-05 ENCOUNTER — Encounter: Payer: Self-pay | Admitting: Cardiovascular Disease

## 2015-10-05 ENCOUNTER — Ambulatory Visit (INDEPENDENT_AMBULATORY_CARE_PROVIDER_SITE_OTHER): Payer: Medicare Other | Admitting: Pharmacist Clinician (PhC)/ Clinical Pharmacy Specialist

## 2015-10-05 DIAGNOSIS — Z7901 Long term (current) use of anticoagulants: Secondary | ICD-10-CM

## 2015-10-05 DIAGNOSIS — I4891 Unspecified atrial fibrillation: Secondary | ICD-10-CM

## 2015-10-05 NOTE — Progress Notes (Signed)
Patient ID: Adam [name], male   DOB: [birth year], 56 y.o.   MRN: [id number]  Discussed the steadily decreasing thoracic impedance (worsening Optivol index) with [place] at the [facility]. She will discuss challenging the patient's "dry weight" with Dr. Mercy Moore.

## 2015-10-09 ENCOUNTER — Ambulatory Visit (INDEPENDENT_AMBULATORY_CARE_PROVIDER_SITE_OTHER): Payer: Medicare Other

## 2015-10-09 ENCOUNTER — Telehealth: Payer: Self-pay | Admitting: Cardiology

## 2015-10-09 DIAGNOSIS — I428 Other cardiomyopathies: Secondary | ICD-10-CM

## 2015-10-09 DIAGNOSIS — I429 Cardiomyopathy, unspecified: Secondary | ICD-10-CM

## 2015-10-09 DIAGNOSIS — Z95810 Presence of automatic (implantable) cardiac defibrillator: Secondary | ICD-10-CM

## 2015-10-09 NOTE — Telephone Encounter (Signed)
Spoke with pt and reminded pt of remote transmission that is due today. Pt verbalized understanding.   

## 2015-10-10 NOTE — Progress Notes (Signed)
EPIC Encounter for ICM Monitoring  Patient Name: Adam [name] is a 55 y.o. male Date: 10/10/2015 Primary Care Physican: Dimas Chyle, MD Primary Cardiologist: Croitoru Electrophysiologist: Croitoru Nephrologist: Marcina Millard Weight: unknown   In the past month, have you:  1. Gained more than 2 pounds in a day or more than 5 pounds in a week? N/A  2. Had changes in your medications (with verification of current medications)? N/A  3. Had more shortness of breath than is usual for you? N/A  4. Limited your activity because of shortness of breath? N/A  5. Not been able to sleep because of shortness of breath? N/A  6. Had increased swelling in your feet, ankles, legs or stomach area? N/A  7. Had symptoms of dehydration (dizziness, dry mouth, increased thirst, decreased urine output) N/A  8. Had changes in sodium restriction? N/A  9. Been compliant with medication? N/A  ICM trend: 3 month view for 10/10/2015   ICM trend: 1 year view for 10/10/2015   Follow-up plan: ICM clinic phone appointment 10/23/2015.    FLUID LEVELS: Since 10/01/2015 ICM transmission, Optivol thoracic impedance continues to be decreased fluid accumulation.    Call to patient 10/11/2015 and requested to send new ICM transmission to compare to the transmission received 10/10/2015.     Rosalene Billings, RN, CCM 10/10/2015 8:30 AM

## 2015-10-11 ENCOUNTER — Ambulatory Visit (INDEPENDENT_AMBULATORY_CARE_PROVIDER_SITE_OTHER): Payer: Medicare Other

## 2015-10-11 ENCOUNTER — Telehealth: Payer: Self-pay

## 2015-10-11 DIAGNOSIS — I428 Other cardiomyopathies: Secondary | ICD-10-CM

## 2015-10-11 DIAGNOSIS — I429 Cardiomyopathy, unspecified: Secondary | ICD-10-CM

## 2015-10-11 DIAGNOSIS — Z95810 Presence of automatic (implantable) cardiac defibrillator: Secondary | ICD-10-CM

## 2015-10-11 NOTE — Telephone Encounter (Signed)
Spoke with patient.  Requested new transmission since he received dialysis 10/10/2015.  He reported weight yesterday was 273.3 after dialysis.   He reported dry weight is usually around 274 lbs.  Waiting on new ICM transmission from home.

## 2015-10-12 NOTE — Progress Notes (Signed)
Well, sounds like a patient issue, doesn't it.

## 2015-10-12 NOTE — Progress Notes (Signed)
EPIC Encounter for ICM Monitoring  Patient Name: Adam [name] is a 55 y.o. male Date: 10/12/2015 Primary Care Physican: Dimas Chyle, MD Primary Cardiologist: Croitoru Electrophysiologist: Croitoru Nephrologist:  Marcina Millard Weight: unknown      In the past month, have you:  1. Gained more than 2 pounds in a day or more than 5 pounds in a week? N/A  2. Had changes in your medications (with verification of current medications)? N/A  3. Had more shortness of breath than is usual for you? N/A  4. Limited your activity because of shortness of breath? N/A  5. Not been able to sleep because of shortness of breath? N/A  6. Had increased swelling in your feet, ankles, legs or stomach area? N/A  7. Had symptoms of dehydration (dizziness, dry mouth, increased thirst, decreased urine output) N/A  8. Had changes in sodium restriction? N/A  9. Been compliant with medication? N/A  ICM trend: 3 month view for 10/11/2015   ICM trend: 1 year view for 10/11/2015         Follow-up plan: ICM clinic phone appointment 10/16/2015.   Call to [facility], [phone] and spoke with [place].  Explained monitoring of fluid levels from patients device.  Explained the impedance is trending  below reference line suggesting he continues to have significant fluid accumulation but there has been slight improvement since last week.  Advised will continue to monitor on weekly basis for a few weeks and provide information to  them as requested by Dr Sallyanne Kuster.  She stated patient does not always allow them to pull the amount of fluid off that should be done.  Advised my note is available in Epic which shows how the impedance is trending.      FLUID LEVELS: Since 10/10/2015 ICM transmission, Optivol thoracic impedance continues to be below reference line suggesting fluid accumulation.    Copy of note sent to Dr Sallyanne Kuster for review.    Rosalene Billings, RN, CCM 10/12/2015 10:58 AM

## 2015-10-15 LAB — PROTIME-INR: INR: 2 — AB (ref 0.9–1.1)

## 2015-10-16 ENCOUNTER — Ambulatory Visit (INDEPENDENT_AMBULATORY_CARE_PROVIDER_SITE_OTHER): Payer: Medicare Other

## 2015-10-16 DIAGNOSIS — I429 Cardiomyopathy, unspecified: Secondary | ICD-10-CM

## 2015-10-16 DIAGNOSIS — Z95810 Presence of automatic (implantable) cardiac defibrillator: Secondary | ICD-10-CM

## 2015-10-16 DIAGNOSIS — I428 Other cardiomyopathies: Secondary | ICD-10-CM

## 2015-10-16 NOTE — Progress Notes (Signed)
EPIC Encounter for ICM Monitoring  Patient Name: Adam [name] is a 55 y.o. male Date: 10/16/2015 Primary Care Physican: Dimas Chyle, MD Primary Cardiologist: Croitoru Electrophysiologist: Croitoru Nephrologist: Marcina Millard Weight: unknown       In the past month, have you:  1. Gained more than 2 pounds in a day or more than 5 pounds in a week? N/A  2. Had changes in your medications (with verification of current medications)? N/A  3. Had more shortness of breath than is usual for you? N/A  4. Limited your activity because of shortness of breath? N/A  5. Not been able to sleep because of shortness of breath? N/A  6. Had increased swelling in your feet or ankles? N/A  7. Had symptoms of dehydration (dizziness, dry mouth, increased thirst, decreased urine output) N/A  8. Had changes in sodium restriction? N/A  9. Been compliant with medication? N/A   ICM trend: 3 month view for 10/16/2015   ICM trend: 1 year view for 10/16/2015   Follow-up plan: ICM clinic phone appointment on 10/23/2015.  Transmission reviewed.  Thoracic impedance continues to be below reference line following dialysis on Monday, Wednesday and Fridays, suggesting fluid accumulation.  No call to patient today and will recheck fluid levels on 10/23/2015.    Copy of note sent to Dr Sallyanne Kuster and Dr Ihor Austin for review.   Rosalene Billings, RN, CCM 10/16/2015 1:36 PM

## 2015-10-17 ENCOUNTER — Encounter: Payer: Self-pay | Admitting: Cardiology

## 2015-10-17 ENCOUNTER — Ambulatory Visit (INDEPENDENT_AMBULATORY_CARE_PROVIDER_SITE_OTHER): Payer: Medicare Other | Admitting: Pharmacist Clinician (PhC)/ Clinical Pharmacy Specialist

## 2015-10-17 DIAGNOSIS — I4891 Unspecified atrial fibrillation: Secondary | ICD-10-CM

## 2015-10-17 DIAGNOSIS — Z7901 Long term (current) use of anticoagulants: Secondary | ICD-10-CM

## 2015-10-19 ENCOUNTER — Encounter: Payer: Self-pay | Admitting: Cardiovascular Disease

## 2015-10-22 ENCOUNTER — Ambulatory Visit: Payer: Medicare Other | Admitting: Podiatry

## 2015-10-23 ENCOUNTER — Ambulatory Visit (INDEPENDENT_AMBULATORY_CARE_PROVIDER_SITE_OTHER): Payer: Medicare Other

## 2015-10-23 DIAGNOSIS — Z95810 Presence of automatic (implantable) cardiac defibrillator: Secondary | ICD-10-CM

## 2015-10-23 DIAGNOSIS — I428 Other cardiomyopathies: Secondary | ICD-10-CM

## 2015-10-23 DIAGNOSIS — I429 Cardiomyopathy, unspecified: Secondary | ICD-10-CM

## 2015-10-23 NOTE — Progress Notes (Addendum)
EPIC Encounter for ICM Monitoring  Patient Name: Adam [name] is a 55 y.o. male Date: 10/23/2015 Primary Care Physican: Dimas Chyle, MD Primary Cardiologist: Croitoru Electrophysiologist: Croitoru Nephrologist:  Marcina Millard Weight: 271 lbs (123 kg)      In the past month, have you:  1. Gained more than 2 pounds in a day or more than 5 pounds in a week? Base weight is 123 kg  2. Had changes in your medications (with verification of current medications)? no  3. Had more shortness of breath than is usual for you? no  4. Limited your activity because of shortness of breath? no  5. Not been able to sleep because of shortness of breath? no  6. Had increased swelling in your feet, ankles, legs or stomach area? no  7. Had symptoms of dehydration (dizziness, dry mouth, increased thirst, decreased urine output) no  8. Had changes in sodium restriction? no  9. Been compliant with medication? Yes  ICM trend: 3 month view for 10/23/2015   ICM trend: 1 year view for 10/23/2015   Follow-up plan: ICM clinic phone appointment 11/08/2015.    FLUID LEVELS:  Since last ICM transmission 10/16/2015, Optivol thoracic impedance remains below reference line and fluid index > threshold suggesting fluid accumulation.      SYMPTOMS: Patient reported 5 kg removed during dialysis yesterday.  He reported he feels fine and denied having any fluid symptoms.    EDUCATION:  He follows renal diet.   RECOMMENDATIONS: No changes today.    Call to [facility] at [phone] and spoke with [place].  Discussed device fluid levels and she advised patient does not usually allow more than 2-3 kg to be removed during dialysis.  She stated his dry weight is 123 kg and he was 129 kg when he arrived to the center.  6 kg should have been removed yesterday but patient allowed 5 kg which is more than he usually allows.  She stated she has spoke with cardiologist to inform him patient sets the amount of  fluid to be removed.     Advised will send to PCP, Dr. Sallyanne Kuster and Dr. Mercy Moore for review continue decreased thoracic impedance.   Rosalene Billings, RN, CCM 10/23/2015 2:49 PM

## 2015-10-23 NOTE — Progress Notes (Signed)
Well, I guess he will let them pull off more fluid when he becomes symptomatic.Marland KitchenMarland Kitchen

## 2015-11-01 LAB — PROTIME-INR: INR: 2.3 — AB (ref 0.9–1.1)

## 2015-11-02 ENCOUNTER — Ambulatory Visit (INDEPENDENT_AMBULATORY_CARE_PROVIDER_SITE_OTHER): Payer: Medicare Other | Admitting: Pharmacist Clinician (PhC)/ Clinical Pharmacy Specialist

## 2015-11-02 DIAGNOSIS — Z7901 Long term (current) use of anticoagulants: Secondary | ICD-10-CM

## 2015-11-02 DIAGNOSIS — I4891 Unspecified atrial fibrillation: Secondary | ICD-10-CM

## 2015-11-06 ENCOUNTER — Ambulatory Visit (INDEPENDENT_AMBULATORY_CARE_PROVIDER_SITE_OTHER): Payer: Medicare Other | Admitting: Cardiology

## 2015-11-06 ENCOUNTER — Ambulatory Visit (INDEPENDENT_AMBULATORY_CARE_PROVIDER_SITE_OTHER): Payer: Medicare Other | Admitting: Podiatry

## 2015-11-06 ENCOUNTER — Ambulatory Visit (INDEPENDENT_AMBULATORY_CARE_PROVIDER_SITE_OTHER): Payer: Medicare Other

## 2015-11-06 ENCOUNTER — Encounter: Payer: Self-pay | Admitting: Cardiology

## 2015-11-06 ENCOUNTER — Encounter: Payer: Self-pay | Admitting: Podiatry

## 2015-11-06 VITALS — BP 63/42 | HR 82 | Ht 73.0 in | Wt 277.6 lb

## 2015-11-06 VITALS — Temp 96.5°F

## 2015-11-06 DIAGNOSIS — L03119 Cellulitis of unspecified part of limb: Secondary | ICD-10-CM

## 2015-11-06 DIAGNOSIS — L02619 Cutaneous abscess of unspecified foot: Secondary | ICD-10-CM

## 2015-11-06 DIAGNOSIS — Z9889 Other specified postprocedural states: Secondary | ICD-10-CM

## 2015-11-06 DIAGNOSIS — M86679 Other chronic osteomyelitis, unspecified ankle and foot: Secondary | ICD-10-CM

## 2015-11-06 DIAGNOSIS — I6523 Occlusion and stenosis of bilateral carotid arteries: Secondary | ICD-10-CM | POA: Diagnosis not present

## 2015-11-06 DIAGNOSIS — I959 Hypotension, unspecified: Secondary | ICD-10-CM | POA: Diagnosis not present

## 2015-11-06 MED ORDER — CLINDAMYCIN HCL 150 MG PO CAPS: 150.0000 mg | ORAL_CAPSULE | Freq: Four times a day (QID) | ORAL | Status: DC

## 2015-11-06 NOTE — Patient Instructions (Signed)
Your physician recommends that you schedule a follow-up appointment in: 3 Months  

## 2015-11-06 NOTE — Progress Notes (Signed)
HPI The patient presents as a new patient. He moved here from [place]. He has end-stage renal disease secondary to uncontrolled hypertension and apparent glomerulonephritis.   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 He is on dialysis.  He was seen at [facility] for part of his transplant evaluation.  Echocardiogram demonstrated well-preserved ejection fraction with some moderate tricuspid regurgitation. He had a right heart catheterization and his pulmonary pressures were actually quite good with his pulmonary vascular resistance being 2.2 Woods units.    Perfusion study was negative for evidence of chronic embolism.   He returns for follow up.  His biggest issue has been low blood pressures. He says he tolerates dialysis without having to stop but that his blood pressure runs low like it is today was lightheaded and dizzy at times. He's not having presyncope or syncope. Denies any chest pressure, neck or arm discomfort. He has no new shortness of breath, PND or orthopnea. He has no weight gain or edema.   Allergies  Allergen Reactions  . Enalaprilat Swelling  . Ivp Dye [Iodinated Diagnostic Agents] Nausea And Vomiting  . Vasotec [Enalapril] Swelling    Current Outpatient Prescriptions  Medication Sig Dispense Refill  . ambrisentan (LETAIRIS) 10 MG tablet Take 1 tablet (10 mg total) by mouth daily. 30 tablet 2  . amiodarone (PACERONE) 200 MG tablet Take 200 mg by mouth daily.    Marland Kitchen atorvastatin (LIPITOR) 40 MG tablet Take 1 tablet (40 mg total) by mouth daily. 90 tablet 3  . calcium acetate (PHOSLO) 667 MG capsule Take by mouth.    . cinacalcet (SENSIPAR) 90 MG tablet Take 90 mg by mouth daily.    . clindamycin (CLEOCIN) 150 MG capsule Take 1 capsule (150 mg total) by mouth 4 (four) times daily. 40 capsule 0  . levothyroxine (SYNTHROID, LEVOTHROID) 112 MCG tablet Take 112 mcg by mouth 2 (two) times daily.    . midodrine (PROAMATINE) 10 MG  tablet Take 1 tablet (10 mg total) by mouth 3 (three) times daily. 90 tablet 11  . Tadalafil, PAH, (ADCIRCA) 20 MG TABS 1 daily on dialysis days, and 2 daily on other days 60 tablet 5  . warfarin (COUMADIN) 6 MG tablet TAKE 1 TO 1.5 TABLETS BY MOUTH DAILY AS DIRECTED BY COUMADIN CLINIC 50 tablet 3   No current facility-administered medications for this visit.    Past Medical History  Diagnosis Date  . Atrial fibrillation ([place])   . Nonischemic cardiomyopathy ([place])   . ICD (implantable cardioverter-defibrillator) in place     [employer]   . Renal failure     Dialysis  . HTN (hypertension)   . Thyroid cancer ([place]) 2011  . Pulmonary HTN ([place])   . 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 Hypothyroidism (acquired)     Past Surgical History  Procedure Laterality Date  . Thyroidectomy    . Dialysis fistula creation    . Kidney transplant    . Laparoscopic gastric banding    . Toe amputations    . Hernia repair    . Revison of arteriovenous fistula Left [id number]    Procedure: PLICATION OF ARTERIOVENOUS FISTULA ANEURYSM;  Surgeon: Elam Dutch, MD;  Location: [facility];  Service: Vascular;  Laterality: Left;  . Thrombectomy w/ embolectomy Left 12/25/2014    Procedure: THROMBECTOMY ARTERIOVENOUS FISTULA;  Surgeon: Elam Dutch, MD;  Location: [facility] [place];  Service: Vascular;  Laterality: Left;    ROS:  As stated in the HPI and negative for all other systems.  PHYSICAL EXAM BP 63/42 mmHg  Pulse 82  Ht 6\' 1"  (1.854 m)  Wt 277 lb 9.6 oz (125.919 kg)  BMI 36.63 kg/m2  SpO2 96%  GENERAL:  Well appearing NECK:  No jugular venous distention, waveform within normal limits, carotid upstroke brisk and symmetric, no bruits, no thyromegaly LUNGS:  Clear to auscultation bilaterally BACK:  No CVA tenderness CHEST:  ICD pocket left side HEART:  PMI not displaced or sustained,S1 and S2 within normal limits, no S3, no S4, no clicks, no rubs, 2/6  Aholosystolic murmur her in particular it is left upper and right upper sternal borders, no diastolic murmurs ABD:  Flat, positive bowel sounds normal in frequency in pitch, no bruits, no rebound, no guarding, no midline pulsatile mass, no hepatomegaly, no splenomegaly EXT:  2 plus pulses throughout, no edema, no cyanosis no clubbing dialysis fistula left upper arm,    ASSESSMENT AND PLAN  CARDIOMYOPATHY:   The EF is preserved.  He will continue on the meds as listed.     ATRIAL FIB:  He has been maintaining NSR.  He is up to date with follow up of his TSH and liver enzymes.  He tolerates anticoagulation.  No change in therapy is indicated.    Adam [name] has a CHA2DS2 - VASc score of 2.    ICD:  He is up to date with follow up.   PULMONARY HTN:  This seems to be well treated on the current meds.  No change in therapy is indicated.   HTN:  The blood pressure is low.  I will give him compression stockings and an abdominal binder.

## 2015-11-06 NOTE — Progress Notes (Signed)
He presents today with a chief complaint of an abscess performed to the dorsal aspect of his right foot over the past couple of days and ruptured yesterday. He was unable to get him see Dr. Earleen Newport who has performed partial amputation to her right foot. He denies fever chills nausea vomiting muscle aches and pains. He states that he was on clindamycin previously.  Objective: Vital signs are stable alert and oriented 3 he still has a barely palpable pulse to the dorsal aspect of the right foot nonpalpable PT capillary fill time is immediate in his foot is warm to the touch as is his leg. The abscess appears to be probing the to the first intermetatarsal space utilizing a sterile culture stick. A culture and sensitivity was taken today I saw no purulence no malodor just bleeding. Radiographs taken do demonstrate what appears to be possible chronic osteomyelitis possible phalanx second digit right foot. I also find a small area which appears to be a laceration in the anterior skin fold between the first and second toes. This is possibly where the bacteria was injuring. I debrided the area today and there was considerable bleeding secondary to his Coumadin therapy.  Assessment: Probable chronic osteomyelitis resulting in an abscess that has ruptured and drained. No cellulitis.  Plan: I packed the area today with sterile gauze and recommended that he leave the compression dressing on that I provided. I suggested he follow up immediately with Dr. Earleen Newport. Should he develop signs or symptoms of worsening infection he will notify the emergency department immediately. I prescribed clindamycin 150 mg 4 times a day. Dr. Carman Ching will follow-up with him Friday.

## 2015-11-07 ENCOUNTER — Telehealth: Payer: Self-pay | Admitting: *Deleted

## 2015-11-07 ENCOUNTER — Ambulatory Visit (INDEPENDENT_AMBULATORY_CARE_PROVIDER_SITE_OTHER): Payer: Medicare Other | Admitting: Podiatry

## 2015-11-07 DIAGNOSIS — L02619 Cutaneous abscess of unspecified foot: Secondary | ICD-10-CM

## 2015-11-07 DIAGNOSIS — Z9889 Other specified postprocedural states: Secondary | ICD-10-CM

## 2015-11-07 DIAGNOSIS — L03119 Cellulitis of unspecified part of limb: Secondary | ICD-10-CM

## 2015-11-07 DIAGNOSIS — M86271 Subacute osteomyelitis, right ankle and foot: Secondary | ICD-10-CM

## 2015-11-07 DIAGNOSIS — R0989 Other specified symptoms and signs involving the circulatory and respiratory systems: Secondary | ICD-10-CM

## 2015-11-07 DIAGNOSIS — I6523 Occlusion and stenosis of bilateral carotid arteries: Secondary | ICD-10-CM

## 2015-11-07 NOTE — Progress Notes (Addendum)
Patient ID: Adam [name], male   DOB: 1961/02/16, 55 y.o.   MRN: [id number]  Subjective: 55 year old male presents the office today for evaluation of possible abscess osteomized the right foot. He states that last Friday history stenosis of fullness on the top of his foot and the area did [place] he was wearing a regular shoe. Denies any pus coming from the area. He saw Dr. Milinda Pointer yesterday needs her with on clindamycin. Denies any pain to the area. Denies any systemic complaints such as fevers, chills, nausea, vomiting. No calf pain, chest pain, shortness of breath. No other complaints at this time.  Objective: AAO 3, NAD DP/PT pulses decreased; unchanged Protective sensation decreased with Simms Weinstein monofilament On the dorsal aspect of the right foot imaging the third and fourth metatarsal heads is a wound which probes stretch down the third metatarsal as well as the fourth metatarsal head. There is no pus expressed however there is serosanguineous drainage expressed. No warmth to the foot. Slight edema to the dorsal foot around the wound. No fluctuance or crepitus. No malodor. No other open lesions are present bilaterally. There is no pain with calf compression, swelling, warmth, erythema.  Assessment: 55 year old male ulceration, abscess right foot with osteomyelitis   Plan: -Treatment options discussed including all alternatives, risks, and complications -He previously had a CT scan which didn't reveal also myelitis however the wound is healed and a signs of infection. This is likely chronic infection that is becoming more active. At this point this is the second time this area has opened. I recommend with him a third partial ray amputation as well as the fourth metatarsal head resection. He wishes to go ahead and proceed with this. -Continue clindamycin for now. -Discussed TMA; wishes to hold off. If there is nonhealing from this surgery or if he required any other surgery on his right foot  will need a TMA -The incision placement as well as the postoperative course was discussed with the patient. I discussed risks of the surgery which include, but not limited to, infection, bleeding, pain, swelling, need for further surgery, delayed or nonhealing, painful or ugly scar, numbness or sensation changes, over/under correction, recurrence, transfer lesions, further deformity, hardware failure, DVT/PE, loss of toe/foot. Patient understands these risks and wishes to proceed with surgery. The surgical consent was reviewed with the patient all 3 pages were signed. No promises or guarantees were given to the outcome of the procedure. All questions were answered to the best of my ability. Before the surgery the patient was encouraged to call the office if there is any further questions. The surgery will be performed at the [facility] on an outpatient basis.  Celesta Gentile, DPM

## 2015-11-07 NOTE — Patient Instructions (Addendum)

## 2015-11-07 NOTE — Telephone Encounter (Addendum)
-----   Message from Trula Slade, DPM sent at 11/06/2015  8:50 PM EDT ----- Based on Dr. Marisa Cyphers note from Tuesday can you order arterial studies and a consult with Dr. Gwenlyn Found. I have a feeling I am going to have to do surgery on him and don't want to wait for me to see him to get this stuff ordered. Doppler orders faxed to [facility] doppler lab. Referral, clinicals, pt demographics faxed.

## 2015-11-08 ENCOUNTER — Telehealth: Payer: Self-pay | Admitting: *Deleted

## 2015-11-08 ENCOUNTER — Telehealth: Payer: Self-pay

## 2015-11-08 LAB — PROTIME-INR: INR: 2.3 — AB (ref 0.9–1.1)

## 2015-11-08 NOTE — Telephone Encounter (Signed)
Attempted call to patient to request ICM remote transmission.  No answer and unable to leave a message.

## 2015-11-08 NOTE — Telephone Encounter (Signed)
"  I just hung up with you about my upcoming surgery.  July 12, I will be away.  I'm going to be out of town.  I'll be back Tuesday July 18."

## 2015-11-08 NOTE — Telephone Encounter (Signed)
I'm calling to let you know we could not get you scheduled for surgery on tomorrow.  "I figured you couldn't."  So, we will have to schedule it for July 12.  "Okay, that will be fine."

## 2015-11-09 ENCOUNTER — Ambulatory Visit: Payer: Medicare Other | Admitting: Podiatry

## 2015-11-09 NOTE — Telephone Encounter (Signed)
I got your message.  Dr. Jacqualyn Posey said he doesn't recommend you waiting that long to have the surgery.  I can't do it then because I have a doctor's appointment scheduled.  I have to go, it's scheduled for once every year.  It's a follow up, my cancer follow-up.  It is scheduled a year in advance."  I'll let Dr. Jacqualyn Posey know and see what he suggests and we'll give you a call back.

## 2015-11-09 NOTE — Telephone Encounter (Signed)
I would do it sooner than later. If the infection gets worse then go to the ER

## 2015-11-09 NOTE — Telephone Encounter (Signed)
I called and informed him that Dr. Jacqualyn Posey suggested that you do surgery sooner than later.  If you notice it getting worse, go to the emergency room.  "Okay, I will."

## 2015-11-12 ENCOUNTER — Other Ambulatory Visit: Payer: Self-pay | Admitting: Podiatry

## 2015-11-12 ENCOUNTER — Ambulatory Visit (INDEPENDENT_AMBULATORY_CARE_PROVIDER_SITE_OTHER): Payer: Medicare Other | Admitting: Pharmacist Clinician (PhC)/ Clinical Pharmacy Specialist

## 2015-11-12 ENCOUNTER — Telehealth: Payer: Self-pay | Admitting: *Deleted

## 2015-11-12 DIAGNOSIS — Z7901 Long term (current) use of anticoagulants: Secondary | ICD-10-CM

## 2015-11-12 DIAGNOSIS — R0989 Other specified symptoms and signs involving the circulatory and respiratory systems: Secondary | ICD-10-CM

## 2015-11-12 DIAGNOSIS — I4891 Unspecified atrial fibrillation: Secondary | ICD-10-CM

## 2015-11-12 NOTE — Telephone Encounter (Signed)
I left patient a message that I was going to schedule his procedure on 11/28/2015.  He will need to come in to see Dr. Jacqualyn Posey prior to July 19 for conservative care.

## 2015-11-15 NOTE — Progress Notes (Signed)
No ICM remote transmission received 11/08/2015.  ICM remote transmission scheduled for 11/27/2015.

## 2015-11-19 ENCOUNTER — Telehealth: Payer: Self-pay | Admitting: *Deleted

## 2015-11-19 NOTE — Telephone Encounter (Signed)
I'm returning your call.  Dr. Jacqualyn Posey said he can do it this Wednesday.  "Okay, what time?"  Someone from the surgical center will call you with the time.  It will be sometime that morning.

## 2015-11-19 NOTE — Telephone Encounter (Signed)
"  Calling to let you know I will not be taking my trip out of town.  So, if Dr. Jacqualyn Posey wants to move my surgery up, I'm available to do that."

## 2015-11-20 ENCOUNTER — Ambulatory Visit (HOSPITAL_COMMUNITY)
Admission: RE | Admit: 2015-11-20 | Discharge: 2015-11-20 | Disposition: A | Discharge status: Home / Self Care | Payer: Medicare Other | Source: Ambulatory Visit | Attending: Podiatry | Admitting: Podiatry

## 2015-11-20 DIAGNOSIS — R0989 Other specified symptoms and signs involving the circulatory and respiratory systems: Secondary | ICD-10-CM

## 2015-11-20 DIAGNOSIS — I272 Other secondary pulmonary hypertension: Secondary | ICD-10-CM | POA: Diagnosis not present

## 2015-11-20 DIAGNOSIS — R938 Abnormal findings on diagnostic imaging of other specified body structures: Secondary | ICD-10-CM | POA: Insufficient documentation

## 2015-11-20 DIAGNOSIS — I129 Hypertensive chronic kidney disease with stage 1 through stage 4 chronic kidney disease, or unspecified chronic kidney disease: Secondary | ICD-10-CM | POA: Insufficient documentation

## 2015-11-20 DIAGNOSIS — I428 Other cardiomyopathies: Secondary | ICD-10-CM | POA: Diagnosis not present

## 2015-11-20 DIAGNOSIS — N189 Chronic kidney disease, unspecified: Secondary | ICD-10-CM | POA: Diagnosis not present

## 2015-11-20 DIAGNOSIS — I4891 Unspecified atrial fibrillation: Secondary | ICD-10-CM | POA: Insufficient documentation

## 2015-11-20 DIAGNOSIS — I739 Peripheral vascular disease, unspecified: Secondary | ICD-10-CM

## 2015-11-21 ENCOUNTER — Encounter: Payer: Self-pay | Admitting: Podiatry

## 2015-11-21 DIAGNOSIS — M86679 Other chronic osteomyelitis, unspecified ankle and foot: Secondary | ICD-10-CM | POA: Diagnosis not present

## 2015-11-23 ENCOUNTER — Ambulatory Visit (INDEPENDENT_AMBULATORY_CARE_PROVIDER_SITE_OTHER): Payer: Medicare Other | Admitting: Podiatry

## 2015-11-23 ENCOUNTER — Ambulatory Visit (INDEPENDENT_AMBULATORY_CARE_PROVIDER_SITE_OTHER): Payer: Medicare Other

## 2015-11-23 VITALS — HR 72 | Resp 16

## 2015-11-23 DIAGNOSIS — Z899 Acquired absence of limb, unspecified: Secondary | ICD-10-CM

## 2015-11-23 DIAGNOSIS — M86679 Other chronic osteomyelitis, unspecified ankle and foot: Secondary | ICD-10-CM

## 2015-11-23 DIAGNOSIS — Z9889 Other specified postprocedural states: Secondary | ICD-10-CM

## 2015-11-27 ENCOUNTER — Ambulatory Visit (INDEPENDENT_AMBULATORY_CARE_PROVIDER_SITE_OTHER): Payer: BLUE CROSS/BLUE SHIELD

## 2015-11-27 DIAGNOSIS — I429 Cardiomyopathy, unspecified: Secondary | ICD-10-CM

## 2015-11-27 DIAGNOSIS — Z95810 Presence of automatic (implantable) cardiac defibrillator: Secondary | ICD-10-CM

## 2015-11-27 DIAGNOSIS — I428 Other cardiomyopathies: Secondary | ICD-10-CM

## 2015-11-27 NOTE — Progress Notes (Signed)
EPIC Encounter for ICM Monitoring  Patient Name: Adam [name] is a 55 y.o. male Date: 11/27/2015 Primary Care Physican: Dimas Chyle, MD Primary Cardiologist: [place] Electrophysiologist: Croitoru Nephrologist: Marcina Millard Weight: 271 lb (123 kg)      Heart Failure questions reviewed, pt asymptomatic.  Patient stated he is doing well.     Thoracic impedence decreased since 11/14/2015 suggesting fluid accumulation.  Impedance returned to baseline 11/08/2015 to 11/14/2015 showing he did not have fluid during that time.         Call to [facility] at [phone] and spoke with Minna Merritts.  Advised patient did not have any fluid showing on device from 11/08/2015 to 11/14/2015 and patient stated he has not changed anything and unsure why there was a            difference.   Recommendations: No changes.     ICM trend: 11/27/2015     Follow-up plan: ICM clinic phone appointment on 01/21/2016.  Office visit with Dr Gwenlyn Found on 12/04/2015 and Dr Sallyanne Kuster 12/18/2015.    Advised will send to PCP, Dr. Sallyanne Kuster and Dr. Mercy Moore for review continue decreased thoracic impedance.    Rosalene Billings, RN 11/27/2015 1:27 PM

## 2015-11-30 ENCOUNTER — Ambulatory Visit (INDEPENDENT_AMBULATORY_CARE_PROVIDER_SITE_OTHER): Payer: Medicare Other | Admitting: Podiatry

## 2015-11-30 ENCOUNTER — Ambulatory Visit: Payer: Medicare Other

## 2015-11-30 ENCOUNTER — Encounter: Payer: Self-pay | Admitting: Podiatry

## 2015-11-30 DIAGNOSIS — Z899 Acquired absence of limb, unspecified: Secondary | ICD-10-CM

## 2015-11-30 DIAGNOSIS — I739 Peripheral vascular disease, unspecified: Secondary | ICD-10-CM

## 2015-11-30 DIAGNOSIS — Z9889 Other specified postprocedural states: Secondary | ICD-10-CM

## 2015-11-30 MED ORDER — CLINDAMYCIN HCL 150 MG PO CAPS: 150.0000 mg | ORAL_CAPSULE | Freq: Four times a day (QID) | ORAL | Status: DC

## 2015-11-30 NOTE — Progress Notes (Signed)
Patient ID: Adam [name], male   DOB: [birth year], 54 y.o.   MRN: [id number]  Subjective: Adam [name] is a 55 y.o. is seen today in office s/p right partial 3rd ray amputation and 4th metatarsal head resection due to osteomyelitis preformed on 11/21/15. He has not had much pain. Adam [name] cam boot. He is remaining on clindamycin as well. He presents today for drain removal. Denies any systemic complaints such as fevers, chills, nausea, vomiting. No calf pain, chest pain, shortness of breath.   Objective: General: No acute distress, AAOx3  DP/PT pulses palpable 2/4, CRT < 3 sec to all digits.   Motor function intact.  Right foot: Incision is well coapted without any evidence of dehiscence and sutures intact. There is no surrounding erythema, ascending cellulitis, fluctuance, crepitus, malodor, drainage/purulence. There is milld edema around the surgical site. There is no pain along the surgical site. Drain in place No other areas of tenderness to bilateral lower extremities.  No other open lesions or pre-ulcerative lesions.  No pain with calf compression, swelling, warmth, erythema.   Assessment and Plan:  Status post right foot amputation, doing well with no complications   -Treatment options discussed including all alternatives, risks, and complications -X-rays were obtained and reviewed with the patient. Status post partial third ray amputation and fourth metatarsal head resection. -For drain was removed today in total. -With the drains removed and the wound saline wet-to-dry dressing and Betadine to the remainder the incision. Continue with daily dressing changes. -Continue with immobilization in surgical shoe. -Ice/elevation -Pain medication as needed. -Monitor for any clinical signs or symptoms of infection and DVT/PE and directed to call the office immediately should any occur or go to the ER. -Follow-up in 1 week or sooner if any problems arise. In the meantime, encouraged to call the office with  any questions, concerns, change in symptoms.   Celesta Gentile, DPM

## 2015-11-30 NOTE — Progress Notes (Signed)
Patient ID: Adam [name], male   DOB: 01/16/1961, 55 y.o.   MRN: [id number]  Subjective: Adam [name] is a 55 y.o. is seen today in office s/p right partial 3rd ray amputation and 4th metatarsal head resection. He states he's been continuing with saline wet-to-dry dressings to the wound. He has noticed some bloody drainage from the wound denies any pus. Denies any swelling redness or red streaks. He is continued on clindamycin. He remained in surgical shoe. Denies any systemic complaints such as fevers, chills, nausea, vomiting. No calf pain, chest pain, shortness of breath.   Objective: General: No acute distress, AAOx3  DP/PT pulses palpable 2/4, CRT < 3 sec to all digits.  Motor function intact.  Right foot foot: Incision is well coapted however there is movement across the incision. Small amount of macerated tissue along the incision. On the dorsal aspect with a drain was removed there is a wound. This wound correspond to the same area where the abscesses localized. There is no probing to bone, undermining or tunneling. There is small bloody drainage but no pus. There is mild edema to the area any surrounding erythema, ascending synovitis. No fluctuance or crepitus. There is no malodor. No other areas of tenderness to bilateral lower extremities.  No other open lesions or pre-ulcerative lesions.  No pain with calf compression, swelling, warmth, erythema.   Assessment and Plan:  Status post right foot surgery, amputation.  -Treatment options discussed including all alternatives, risks, and complications -Recommended Betadine to the incision daily. Small amount of saline to the wound dorsally. Continue daily dressing changes. -Continue clindamycin. Refilled today. -Surgical shoe at all times. Limit weightbearing. -Ice/elevation -Pain medication as needed. -Monitor for any clinical signs or symptoms of infection and DVT/PE and directed to call the office immediately should any occur or go to the  ER. -Follow-up in 10 days or sooner if any problems arise. In the meantime, encouraged to call the office with any questions, concerns, change in symptoms.   Celesta Gentile, DPM

## 2015-12-03 ENCOUNTER — Encounter: Payer: Self-pay | Admitting: Pulmonary Disease

## 2015-12-03 ENCOUNTER — Ambulatory Visit (INDEPENDENT_AMBULATORY_CARE_PROVIDER_SITE_OTHER): Payer: Medicare Other | Admitting: Pulmonary Disease

## 2015-12-03 DIAGNOSIS — I272 Other secondary pulmonary hypertension: Secondary | ICD-10-CM

## 2015-12-03 DIAGNOSIS — I959 Hypotension, unspecified: Secondary | ICD-10-CM | POA: Diagnosis not present

## 2015-12-03 DIAGNOSIS — I6523 Occlusion and stenosis of bilateral carotid arteries: Secondary | ICD-10-CM | POA: Diagnosis not present

## 2015-12-03 NOTE — Patient Instructions (Signed)
Talk to Dr. Mercy Moore. My suggestion is that you increase the dry weight post dialysis by a kilogram. If this does not help your low blood pressure spells, or if Dr. Mercy Moore does not want to do this, then let me know and I will decrease the dose of the Adcirca to 10 mg daily. Continue taking your medications as you are doing otherwise I will see you back in 3 months

## 2015-12-03 NOTE — Assessment & Plan Note (Signed)
He had a right heart catheterization in April which showed a normal right atrial pressure, mean right pulmonary artery pressure of 31 which is fantastic. His wedge pressure was normal as well.   Plan: Continue Letairis Continue Adcirca LFTs next visit Discussion: Currently I prefer to continue on his current management. However, he's been experiencing hypotension which can be a side effect of tadalafil in particular. Because of the low right atrial pressure and low wedge pressure and increased symptoms when his postdialysis weight is less than his dry weight I would first suggest that we increase his dry weight slightly to see if this helps with his hypotension and symptoms associated with that. However, if nephrology would prefer to maintain his dry weight at current dosing then I can decrease the dose of the Adcirca.  I recommended that he use compression stockings for his leg swelling. He doesn't seem to understand logic behind this and he doesn't want to do that.

## 2015-12-03 NOTE — Assessment & Plan Note (Signed)
See my discussion above about hypotension.

## 2015-12-03 NOTE — Progress Notes (Signed)
Subjective:    Patient ID: Adam [name], male    DOB: August 03, 1960, 55 y.o.   MRN: [id number]  Synopsis: Diagnosed with pulmonary hypertension in 2012. 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 Unfortunately his kidney transplant failed for uncertain reasons by [id number]. Was diagnosed with pulmonary hypertension by 2012 in [place]. Was started on [place] in [place] and moved to [place] not long afterwards.  HPI Chief Complaint  Patient presents with  . Follow-up    pt states he has no breathing complaints today. notes he's been having low BP readings, will get lightheaded when it gets low.     Adam [name] had a right heart catheterization by Dr. Gilles [name] at [facility] as part of his tranpsplant evaluation.  She told him that his pulmonary hypertension should not interfere with his transplant evaluation. He is continuing to participate in the transplant evaluation process.  He continues to follow with [facility].  Even in all the heat we have been having his breathing has been OK. He continues to struggle with low blood pressure. He says that it is happening on non-dialysis days now.  He feels light headed    Past Medical History:  Diagnosis Date  . Atrial fibrillation ([place])   . Great toe amputation status ([place])    status post left hallux amputation 08/01/14  . HTN (hypertension)   . Hypothyroidism (acquired)   . ICD (implantable cardioverter-defibrillator) in place    [employer]   . Nonischemic cardiomyopathy ([place])   . Pulmonary HTN ([place])   . Renal failure    Dialysis  . Thyroid cancer ([place]) 2011      Review of Systems  Constitutional: Negative for chills, fatigue and fever.  HENT: Negative for postnasal drip, rhinorrhea and sinus pressure.   Respiratory: Negative for cough, shortness of breath and wheezing.   Cardiovascular: Negative for chest pain, palpitations and leg swelling.       Objective:   Physical Exam Vitals:   12/03/15  1353  BP: (!) 106/58  Pulse: 73  SpO2: 99%  Weight: 272 lb (123.4 kg)  Height: 6\' 1"  (1.854 m)   RA  Gen: well appearing HENT: OP clear,  neck supple PULM: CTA B, normal percussion CV: RRR, slight systolic murmur trace edema GI: BS+, soft, nontender Derm: no cyanosis or rash Psyche: normal mood and affect  March 2017 6 minute walk test at [facility] 385 m, O2 saturation 100% on room air March 2017 echocardiogram [facility] RVSP 40 mmHg, LVEF 55% April 2017 right heart catheterization [facility] cardiac index 3.7, right atrial pressure 5, RVSP 46, mean PA pressure 31, pulmonary capillary wedge pressure 11, pulmonary vascular resistance 2.2  Records from the [facility] pulmonary hypertension clinic and transplantation clinic reviewed.  Liver function testing at [facility] in April 2017 normal with the exception of a low ALT.    Assessment & Plan:  Pulmonary hypertension ([place]) He had a right heart catheterization in April which showed a normal right atrial pressure, mean right pulmonary artery pressure of 31 which is fantastic. His wedge pressure was normal as well.   Plan: Continue [place] Continue Adcirca LFTs next visit Discussion: Currently I prefer to continue on his current management. However, he's been experiencing hypotension which can be a side effect of tadalafil in particular. Because of the low right atrial pressure and low wedge pressure and increased symptoms when his postdialysis weight is less than his dry weight  I would first suggest that we increase his dry weight slightly to see if this helps with his hypotension and symptoms associated with that. However, if nephrology would prefer to maintain his dry weight at current dosing then I can decrease the dose of the Adcirca.  I recommended that he use compression stockings for his leg swelling. He doesn't seem to understand logic behind this and he doesn't want to do that.   Hypotension  (arterial) See my discussion above about hypotension.  > 50 % of this visit spent face to face  Current Outpatient Prescriptions:  .  ambrisentan (LETAIRIS) 10 MG tablet, Take 1 tablet (10 mg total) by mouth daily., Disp: 30 tablet, Rfl: 2 .  amiodarone (PACERONE) 200 MG tablet, Take 200 mg by mouth daily., Disp: , Rfl:  .  atorvastatin (LIPITOR) 40 MG tablet, Take 1 tablet (40 mg total) by mouth daily., Disp: 90 tablet, Rfl: 3 .  calcium acetate (PHOSLO) 667 MG capsule, Take by mouth., Disp: , Rfl:  .  cinacalcet (SENSIPAR) 90 MG tablet, Take 90 mg by mouth daily., Disp: , Rfl:  .  clindamycin (CLEOCIN) 150 MG capsule, Take 1 capsule (150 mg total) by mouth 4 (four) times daily., Disp: 40 capsule, Rfl: 0 .  levothyroxine (SYNTHROID, LEVOTHROID) 112 MCG tablet, Take 112 mcg by mouth 2 (two) times daily., Disp: , Rfl:  .  midodrine (PROAMATINE) 10 MG tablet, Take 1 tablet (10 mg total) by mouth 3 (three) times daily., Disp: 90 tablet, Rfl: 11 .  Tadalafil, PAH, (ADCIRCA) 20 MG TABS, 1 daily on dialysis days, and 2 daily on other days, Disp: 60 tablet, Rfl: 5 .  warfarin (COUMADIN) 6 MG tablet, TAKE 1 TO 1.5 TABLETS BY MOUTH DAILY AS DIRECTED BY COUMADIN CLINIC, Disp: 50 tablet, Rfl: 3

## 2015-12-04 ENCOUNTER — Encounter: Payer: Self-pay | Admitting: Cardiovascular Disease

## 2015-12-04 ENCOUNTER — Ambulatory Visit (INDEPENDENT_AMBULATORY_CARE_PROVIDER_SITE_OTHER): Payer: Medicare Other | Admitting: Cardiovascular Disease

## 2015-12-04 VITALS — BP 91/60 | HR 71 | Ht 73.0 in | Wt 272.6 lb

## 2015-12-04 DIAGNOSIS — I739 Peripheral vascular disease, unspecified: Secondary | ICD-10-CM | POA: Diagnosis not present

## 2015-12-04 DIAGNOSIS — I998 Other disorder of circulatory system: Secondary | ICD-10-CM | POA: Diagnosis not present

## 2015-12-04 DIAGNOSIS — I70229 Atherosclerosis of native arteries of extremities with rest pain, unspecified extremity: Secondary | ICD-10-CM | POA: Insufficient documentation

## 2015-12-04 DIAGNOSIS — I6523 Occlusion and stenosis of bilateral carotid arteries: Secondary | ICD-10-CM

## 2015-12-04 NOTE — Assessment & Plan Note (Signed)
Adam [name] returns for follow-up of critical limb ischemia. I last saw him in the office 10/13/14. He had left hallux amputated 07/30/14 which healed slowly last year. His Dopplers from a year ago revealed three-vessel run runoff below his knee 09/14/14. He had a second right toe ray amputation 11/21/15 which is common. Now by an abscess. His Dopplers a year ago showed only 1 vessel runoff below the knee with an occluded dorsalis pedis and peroneal artery. He is a dialysis patient. I'm going to repeat Lotrisone Dopplers studies to further evaluate his infrapopliteal vasculature. He says that he thinks his wound is slowly healing. He is scheduled to see Dr. Jacqualyn Posey later this week. Should his wound not continue to heal sufficiently he may need angiography and potential endovascular therapy to improve blood flow to his foot. I will see him back in one month.

## 2015-12-04 NOTE — Patient Instructions (Signed)
Medication Instructions:  Your physician recommends that you continue on your current medications as directed. Please refer to the Current Medication list given to you today.   Labwork: N/A  Testing/Procedures: Your physician has requested that you have a lower extremity arterial doppler- During this test, ultrasound is used to evaluate arterial blood flow in the legs. Allow approximately one hour for this exam. WITHIN THE NEXT WEEK.   Follow-Up: Your physician recommends that you schedule a follow-up appointment in: [place].  If you need a refill on your cardiac medications before your next appointment, please call your pharmacy.

## 2015-12-04 NOTE — Progress Notes (Signed)
12/04/2015 [name]   07-02-1960  [id number]  Primary Physician Dimas Chyle, MD Primary Cardiologist: Lorretta Harp MD Renae Gloss  HPI:  Mr. Howery is a 55 year old severely overweight divorced African-American male father of one child who recently relocated from [place] to [place] in December 2015. He is a retired Designer, industrial/product. I last saw him in the office 10/13/14. He has a history of nonischemic myopathy, her physical atrial fibrillation on Coumadin anticoagulation. He's had an ICD placed prophylactically which was recently interrogated by Dr. Sallyanne Kuster 07/25/14 and found to be working correctly. He has chronic renal insufficiency status post renal transplant 2001 which failed 18 months later. He has an AV fistula in his left upper extremity. His nephrologist is Dr. Fleet Contras. His other problems include history of hypertension, thyroid cancer and pulmonary hypertension. He has had right hallux and Pediazole years ago and had his left hallux amputated on 07/30/14 which is slowly healing up until 3 weeks ago. He saw Dr. Jacqualyn Posey, his podiatrist, today who demonstrated appropriate healing and his lower extremity arterial Doppler studies performed on 09/14/14 revealed intact circulation on the left side. Since I saw him a year ago he underwent right second toe ray amputation with subsequent abscess formation placed on clindamycin. He's been followed closely in the office by Dr. Jacqualyn Posey. Should be noted that his Dopplers from 09/14/14 revealed only 1 vessel runoff on the right with occluded dorsalis pedis and peroneal artery.    Current Outpatient Prescriptions  Medication Sig Dispense Refill  . ambrisentan (LETAIRIS) 10 MG tablet Take 1 tablet (10 mg total) by mouth daily. 30 tablet 2  . amiodarone (PACERONE) 200 MG tablet Take 200 mg by mouth daily.    Marland Kitchen atorvastatin (LIPITOR) 40 MG tablet Take 1 tablet (40 mg total) by mouth daily. 90 tablet 3  . calcium  acetate (PHOSLO) 667 MG capsule Take by mouth.    . cinacalcet (SENSIPAR) 90 MG tablet Take 90 mg by mouth daily.    . clindamycin (CLEOCIN) 150 MG capsule Take 1 capsule (150 mg total) by mouth 4 (four) times daily. 40 capsule 0  . levothyroxine (SYNTHROID, LEVOTHROID) 112 MCG tablet Take 112 mcg by mouth 2 (two) times daily.    . midodrine (PROAMATINE) 10 MG tablet Take 1 tablet (10 mg total) by mouth 3 (three) times daily. 90 tablet 11  . Tadalafil, PAH, (ADCIRCA) 20 MG TABS 1 daily on dialysis days, and 2 daily on other days 60 tablet 5  . warfarin (COUMADIN) 6 MG tablet TAKE 1 TO 1.5 TABLETS BY MOUTH DAILY AS DIRECTED BY COUMADIN CLINIC 50 tablet 3   No current facility-administered medications for this visit.     Allergies  Allergen Reactions  . Enalaprilat Swelling  . Ivp Dye [Iodinated Diagnostic Agents] Nausea And Vomiting  . Vasotec [Enalapril] Swelling    Social History   Social History  . Marital status: Married    Spouse name: N/A  . Number of children: 1  . Years of education: N/A   Occupational History  . Not on file.   Social History Main Topics  . Smoking status: Never Smoker  . Smokeless tobacco: Never Used  . Alcohol use No  . Drug use: No  . Sexual activity: Not on file   Other Topics Concern  . Not on file   Social History Narrative   Retired Archivist.       Review of Systems: General: negative  for chills, fever, night sweats or weight changes.  Cardiovascular: negative for chest pain, dyspnea on exertion, edema, orthopnea, palpitations, paroxysmal nocturnal dyspnea or shortness of breath Dermatological: negative for rash Respiratory: negative for cough or wheezing Urologic: negative for hematuria Abdominal: negative for nausea, vomiting, diarrhea, bright red blood per rectum, melena, or hematemesis Neurologic: negative for visual changes, syncope, or dizziness All other systems reviewed and are otherwise negative except as  noted above.    Blood pressure 91/60, pulse 71, height 6\' 1"  (1.854 m), weight 272 lb 9.6 oz (123.7 kg).  General appearance: alert and no distress Neck: no adenopathy, no carotid bruit, no JVD, supple, symmetrical, trachea midline and thyroid not enlarged, symmetric, no tenderness/mass/nodules Lungs: clear to auscultation bilaterally Heart: regular rate and rhythm, S1, S2 normal, no murmur, click, rub or gallop Extremities: Packed wound right second toe  EKG not performed today  ASSESSMENT AND PLAN:   Critical lower limb ischemia Mr. Ralene Cork returns for follow-up of critical limb ischemia. I last saw him in the office 10/13/14. He had left hallux amputated 07/30/14 which healed slowly last year. His Dopplers from a year ago revealed three-vessel run runoff below his knee 09/14/14. He had a second right toe ray amputation 11/21/15 which is common. Now by an abscess. His Dopplers a year ago showed only 1 vessel runoff below the knee with an occluded dorsalis pedis and peroneal artery. He is a dialysis patient. I'm going to repeat Lotrisone Dopplers studies to further evaluate his infrapopliteal vasculature. He says that he thinks his wound is slowly healing. He is scheduled to see Dr. Jacqualyn Posey later this week. Should his wound not continue to heal sufficiently he may need angiography and potential endovascular therapy to improve blood flow to his foot. I will see him back in one month.      Lorretta Harp MD FACP,FACC,FAHA, [facility] 12/04/2015 9:08 AM

## 2015-12-05 LAB — PROTIME-INR: INR: 1.9 — AB (ref 0.9–1.1)

## 2015-12-07 ENCOUNTER — Ambulatory Visit (INDEPENDENT_AMBULATORY_CARE_PROVIDER_SITE_OTHER): Payer: Medicare Other | Admitting: Podiatry

## 2015-12-07 ENCOUNTER — Encounter: Payer: Self-pay | Admitting: Podiatry

## 2015-12-07 ENCOUNTER — Ambulatory Visit (INDEPENDENT_AMBULATORY_CARE_PROVIDER_SITE_OTHER): Payer: Medicare Other | Admitting: Pharmacist

## 2015-12-07 ENCOUNTER — Telehealth: Payer: Self-pay | Admitting: *Deleted

## 2015-12-07 DIAGNOSIS — Z899 Acquired absence of limb, unspecified: Secondary | ICD-10-CM

## 2015-12-07 DIAGNOSIS — I4891 Unspecified atrial fibrillation: Secondary | ICD-10-CM

## 2015-12-07 DIAGNOSIS — L97511 Non-pressure chronic ulcer of other part of right foot limited to breakdown of skin: Secondary | ICD-10-CM

## 2015-12-07 DIAGNOSIS — Z7901 Long term (current) use of anticoagulants: Secondary | ICD-10-CM

## 2015-12-07 MED ORDER — COLLAGENASE 250 UNIT/GM EX OINT: 1.0000 "application " | TOPICAL_OINTMENT | Freq: Every day | CUTANEOUS | 0 refills | Status: DC

## 2015-12-07 NOTE — Telephone Encounter (Addendum)
Dr. Jacqualyn Posey ordered [facility] for right foot. 12/10/2015-Required Santyl Direct form, pt clinicals and demographics faxed.

## 2015-12-08 MED ORDER — COLLAGENASE 250 UNIT/GM EX OINT: 1.0000 "application " | TOPICAL_OINTMENT | Freq: Every day | CUTANEOUS | 0 refills | Status: DC

## 2015-12-08 NOTE — Progress Notes (Signed)
Patient ID: Adam [name], male   DOB: 10-Feb-1961, 55 y.o.   MRN: [id number]  Subjective: Adam [name] is a 55 y.o. is seen today in office s/p right partial 3rd ray amputation and 4th metatarsal head resection. He has followed up with Dr. Alvester Chou as well. He believes of the incision is doing well and healing. He states he can feel it is healing. His continue with daily dressing changes. He is on clindamycin.  He remained in surgical shoe. Denies any systemic complaints such as fevers, chills, nausea, vomiting. No calf pain, chest pain, shortness of breath.   Objective: General: No acute distress, AAOx3  DP/PT pulses palpable 2/4, CRT < 3 sec to all digits.  Motor function intact.  Right foot foot: Incision on the dorsal aspect of the foot with a small metal superficial dehiscence with fibrotic base. There is no drainage or pus expressed. Minimal edema to the foot and there is no erythema or increase in warmth. There is no ascending synovitis There is no malodor. No other areas of tenderness to bilateral lower extremities.  No other open lesions or pre-ulcerative lesions.  No pain with calf compression, swelling, warmth, erythema.   Assessment and Plan:  Status post right foot surgery, amputation.  -Treatment options discussed including all alternatives, risks, and complications -As high left the sutures intact to help hold the incision coapted as much as possible. Continue daily dressing changes. I ordered Santyl given the fibrotic nature of the wound. The wound measures about 2 x 0.3 cm.  -Continue surgical shoe -Continue to follow with Dr. Alvester Chou as discussed. -Pain medication as needed -Elevation -Monitor for signs or symptoms of infection to the ER should any occur. Follow-up with me as scheduled or sooner if needed. Call if questions or concerns.  Celesta Gentile, DPM

## 2015-12-08 NOTE — Addendum Note (Signed)
Addended by: Celesta Gentile R on: 12/08/2015 05:13 PM   Modules accepted: Orders

## 2015-12-10 NOTE — Telephone Encounter (Signed)
Chart is done

## 2015-12-12 ENCOUNTER — Ambulatory Visit (HOSPITAL_COMMUNITY)
Admission: RE | Admit: 2015-12-12 | Discharge: 2015-12-12 | Disposition: A | Discharge status: Home / Self Care | Payer: Medicare Other | Source: Ambulatory Visit | Attending: Cardiovascular Disease | Admitting: Cardiovascular Disease

## 2015-12-12 ENCOUNTER — Other Ambulatory Visit: Payer: Self-pay | Admitting: Podiatry

## 2015-12-12 ENCOUNTER — Other Ambulatory Visit: Payer: Self-pay | Admitting: Cardiovascular Disease

## 2015-12-12 DIAGNOSIS — I272 Other secondary pulmonary hypertension: Secondary | ICD-10-CM | POA: Insufficient documentation

## 2015-12-12 DIAGNOSIS — I10 Essential (primary) hypertension: Secondary | ICD-10-CM | POA: Insufficient documentation

## 2015-12-12 DIAGNOSIS — E039 Hypothyroidism, unspecified: Secondary | ICD-10-CM | POA: Diagnosis not present

## 2015-12-12 DIAGNOSIS — I428 Other cardiomyopathies: Secondary | ICD-10-CM | POA: Insufficient documentation

## 2015-12-12 DIAGNOSIS — I739 Peripheral vascular disease, unspecified: Secondary | ICD-10-CM | POA: Diagnosis not present

## 2015-12-12 DIAGNOSIS — L97909 Non-pressure chronic ulcer of unspecified part of unspecified lower leg with unspecified severity: Secondary | ICD-10-CM

## 2015-12-12 DIAGNOSIS — I779 Disorder of arteries and arterioles, unspecified: Secondary | ICD-10-CM | POA: Insufficient documentation

## 2015-12-17 ENCOUNTER — Encounter: Payer: Medicare Other | Admitting: Podiatry

## 2015-12-18 ENCOUNTER — Encounter: Payer: Medicare Other | Admitting: Cardiovascular Disease

## 2015-12-24 ENCOUNTER — Ambulatory Visit (INDEPENDENT_AMBULATORY_CARE_PROVIDER_SITE_OTHER): Payer: Medicare Other

## 2015-12-24 ENCOUNTER — Ambulatory Visit (INDEPENDENT_AMBULATORY_CARE_PROVIDER_SITE_OTHER): Payer: Medicare Other | Admitting: Podiatry

## 2015-12-24 ENCOUNTER — Encounter: Payer: Self-pay | Admitting: Podiatry

## 2015-12-24 DIAGNOSIS — L84 Corns and callosities: Secondary | ICD-10-CM

## 2015-12-24 DIAGNOSIS — I739 Peripheral vascular disease, unspecified: Secondary | ICD-10-CM

## 2015-12-24 DIAGNOSIS — Z09 Encounter for follow-up examination after completed treatment for conditions other than malignant neoplasm: Secondary | ICD-10-CM

## 2015-12-24 DIAGNOSIS — L97511 Non-pressure chronic ulcer of other part of right foot limited to breakdown of skin: Secondary | ICD-10-CM

## 2015-12-26 LAB — PROTIME-INR: INR: 1.3 — AB (ref 0.9–1.1)

## 2015-12-27 ENCOUNTER — Other Ambulatory Visit: Payer: Self-pay

## 2015-12-27 MED ORDER — TADALAFIL (PAH) 20 MG PO TABS: ORAL_TABLET | ORAL | 11 refills | Status: DC

## 2015-12-28 ENCOUNTER — Ambulatory Visit (INDEPENDENT_AMBULATORY_CARE_PROVIDER_SITE_OTHER): Payer: Medicare Other | Admitting: Pharmacist

## 2015-12-28 ENCOUNTER — Telehealth: Payer: Self-pay | Admitting: Pulmonary Disease

## 2015-12-28 DIAGNOSIS — Z7901 Long term (current) use of anticoagulants: Secondary | ICD-10-CM

## 2015-12-28 DIAGNOSIS — I4891 Unspecified atrial fibrillation: Secondary | ICD-10-CM

## 2015-12-28 MED ORDER — TADALAFIL (PAH) 20 MG PO TABS: ORAL_TABLET | ORAL | 11 refills | Status: DC

## 2015-12-28 NOTE — Telephone Encounter (Signed)
rx for the adcirca has been sent to [facility].  Nothing further is needed.

## 2015-12-30 NOTE — Progress Notes (Signed)
Patient ID: Adam [name], male   DOB: [birth year], 55 y.o.   MRN: [id number]  Subjective: Adam [name] is a 55 y.o. is seen today in office s/p right partial 3rd ray amputation and 4th metatarsal head resection. He states he believes that the foot is looking "much better". He denies any drainage or pus or any swelling or redness to his foot. He says even a callus on the right fifth toe to be trimmed as if any thick again. He has remained in surgical shoe. Denies any systemic complaints such as fevers, chills, nausea, vomiting. No calf pain, chest pain, shortness of breath.   Objective: General: No acute distress, AAOx3  DP/PT pulses palpable 2/4, CRT < 3 sec to all digits.  Motor function intact.  Right foot foot: Incision on the dorsal aspect of the foot with a small metal superficial dehiscence. The wound base is granular today. There is no probing, undermining or tunneling. The wound appears to be healing well. Is no drainage or pus or any surrounding erythema, ascending saline disc, fluctuance, cutters, malodor.  No other areas of tenderness to bilateral lower extremities.  No other open lesions or pre-ulcerative lesions.  No pain with calf compression, swelling, warmth, erythema.   Assessment and Plan:  Status post right foot surgery, amputation to hyperkeratotic lesion.  -Treatment options discussed including all alternatives, risks, and complications -Remainder of the sutures were removed today without complications. Continue with daily dressing changes as discussed with anabolic 1 and a bandage. Continue antibiotics. Continue surgical shoe. Continue monitoring signs or symptoms of infection to call the office and that should any occur or go to the ER. At the keratotic lesion right fifth toe debrided without consultation or bleeding. Follow up as scheduled or sooner if needed.  Celesta Gentile, DPM

## 2016-01-01 ENCOUNTER — Telehealth: Payer: Self-pay | Admitting: Pulmonary Disease

## 2016-01-01 NOTE — Telephone Encounter (Signed)
My specific question for his nephrologist was to see if they would be willing to increase his dry weight to see if this helps with hypotension, dizziness, etc.  If he can't reach Dr. Mercy Moore (though they typically see them weekly), then we can reach out to [facility].  Thanks

## 2016-01-01 NOTE — Telephone Encounter (Signed)
Received call from [facility] requesting refill on Adcirca.  Patient advised [facility] that he is taking 1 tablet daily.  His prescription states Adcirca 20mg  tablets, 1 on dialysis day, 2 other days.  Also, in Dr. Anastasia Pall last OV note, he mentioned discussing medication with Dr. Mercy Moore to see if okay to drop medication to 10mg  daily.  Dr. Lake Bells, please advise on dose that needs to be prescribed for this patient.

## 2016-01-01 NOTE — Telephone Encounter (Signed)
Attempted to contact patient, left message for patient to return call.

## 2016-01-01 NOTE — Telephone Encounter (Signed)
[phone], pt cb

## 2016-01-01 NOTE — Telephone Encounter (Signed)
Called spoke with pt. He reports he has not spoke with Dr. Mercy Moore. He reports he has not seen him and he has not placed a call to speak with him either. Please advise Dr. Lake Bells thanks

## 2016-01-01 NOTE — Telephone Encounter (Signed)
Please call the patient to see if he discussed this matter with Dr. Mercy Moore.  Specifically he was going to talk to Dr. Mercy Moore about whether or not his dry weight needed to be adjusted.  This information will help me answer your question better.

## 2016-01-02 LAB — PROTIME-INR: INR: 1.9 — AB (ref <=1.1)

## 2016-01-02 NOTE — Telephone Encounter (Signed)
Attempted to call pt. Line was busy x2. Will try back later. 

## 2016-01-03 NOTE — Telephone Encounter (Signed)
lmtcb x1 for pt. 

## 2016-01-04 ENCOUNTER — Ambulatory Visit (INDEPENDENT_AMBULATORY_CARE_PROVIDER_SITE_OTHER): Payer: Medicare Other | Admitting: Pharmacist Clinician (PhC)/ Clinical Pharmacy Specialist

## 2016-01-04 ENCOUNTER — Other Ambulatory Visit: Payer: Self-pay

## 2016-01-04 DIAGNOSIS — I4891 Unspecified atrial fibrillation: Secondary | ICD-10-CM

## 2016-01-04 DIAGNOSIS — Z7901 Long term (current) use of anticoagulants: Secondary | ICD-10-CM

## 2016-01-04 MED ORDER — TADALAFIL (PAH) 20 MG PO TABS: ORAL_TABLET | ORAL | 11 refills | Status: DC

## 2016-01-04 NOTE — Telephone Encounter (Signed)
No problem.  Please find out from [facility] how I can contact him.  Pager number or cell would be best.  Not urgent.  Thanks

## 2016-01-04 NOTE — Telephone Encounter (Signed)
Called and spoke with pt and he stated that he thought that BQ was going to speak with Dr. Mercy Moore about the dry weight if it needs to be increased or not with a new dosage of the adcirca.  Pt stated that he could speak with Dr. Mercy Moore at his next appt, but would feel better if BQ spoke directly with him.  BQ please advise. thanks

## 2016-01-04 NOTE — Telephone Encounter (Signed)
Called [facility] and Dr. Mercy Moore is on call tonight. His pager number is [phone]

## 2016-01-06 ENCOUNTER — Other Ambulatory Visit: Payer: Self-pay | Admitting: Cardiology

## 2016-01-07 ENCOUNTER — Other Ambulatory Visit: Payer: Medicare Other

## 2016-01-07 NOTE — Telephone Encounter (Signed)
Spoke with Mickel Baas at [facility], wanted to verify pt's dosage of Adcirca.  BQ please advise if you've spoken to pt's nephrologist regarding his dry weight yet, and if so please advise if we need to change pt's Adcirca rx.  Thanks!

## 2016-01-07 NOTE — Telephone Encounter (Signed)
[facility] calling back [phone] opt 2 and  Opt 1 Leah

## 2016-01-10 ENCOUNTER — Ambulatory Visit (INDEPENDENT_AMBULATORY_CARE_PROVIDER_SITE_OTHER): Payer: Medicare Other | Admitting: Pharmacist Clinician (PhC)/ Clinical Pharmacy Specialist

## 2016-01-10 ENCOUNTER — Encounter: Payer: Self-pay | Admitting: Cardiovascular Disease

## 2016-01-10 ENCOUNTER — Ambulatory Visit (INDEPENDENT_AMBULATORY_CARE_PROVIDER_SITE_OTHER): Payer: Medicare Other | Admitting: Cardiovascular Disease

## 2016-01-10 VITALS — BP 118/77 | HR 75 | Ht 73.0 in | Wt 277.0 lb

## 2016-01-10 DIAGNOSIS — I779 Disorder of arteries and arterioles, unspecified: Secondary | ICD-10-CM

## 2016-01-10 DIAGNOSIS — Z79899 Other long term (current) drug therapy: Secondary | ICD-10-CM | POA: Diagnosis not present

## 2016-01-10 DIAGNOSIS — Z7901 Long term (current) use of anticoagulants: Secondary | ICD-10-CM

## 2016-01-10 DIAGNOSIS — I4891 Unspecified atrial fibrillation: Secondary | ICD-10-CM

## 2016-01-10 DIAGNOSIS — Z95810 Presence of automatic (implantable) cardiac defibrillator: Secondary | ICD-10-CM

## 2016-01-10 LAB — POCT INR: INR: 1.7

## 2016-01-10 NOTE — Patient Instructions (Signed)
NO CHANGES WITH CURRENT TREATMENT OR MEDICATIONS  Remote monitoring is used to monitor your Pacemaker of ICD from home. This monitoring reduces the number of office visits required to check your device to one time per year. It allows [place] to keep an eye on the functioning of your device to ensure it is working properly. You are scheduled for a device check from home on 3 MONTHS (NOV 2017). You may send your transmission at any time that day. If you have a wireless device, the transmission will be sent automatically. After your physician reviews your transmission, you will receive a postcard with your next transmission date.   Your physician wants you to follow-up in: [place]. You will receive a reminder letter in the mail two months in advance. If you don't receive a letter, please call our office to schedule the follow-up appointment.   If you need a refill on your cardiac medications before your next appointment, please call your pharmacy.

## 2016-01-10 NOTE — Telephone Encounter (Signed)
We are going to leave the dose the same for now.  Dr. Mercy Moore says he will adjust his dry weight at dialysis.  Thanks

## 2016-01-10 NOTE — Telephone Encounter (Signed)
Called [facility] and spoke with [facility] and notified of response per BQ  Nothing further needed

## 2016-01-10 NOTE — Progress Notes (Signed)
Cardiology Office Note    Date:  01/10/2016   ID:  [name], DOB 10-Aug-1960, MRN [id number]  PCP:  Dimas Chyle, MD  Cardiologist:  Lenna Sciara. Percival Spanish, M.D.; Quay Burow, M.D. (PAD); Sanda Klein, MD   Chief Complaint  Patient presents with  . Follow-up    3 months; device check. Pt states no Sx or no concerns.    History of Present Illness:  Adam [name] is a 55 y.o. male with advanced nonischemic cardiomyopathy, pulmonary hypertension, peripheral arterial disease, renal failure on hemodialysis, here for follow-up on his defibrillator. He recently saw Dr. Gwenlyn Found and has an appointment scheduled with Dr. Percival Spanish in a couple of weeks.  Since his last appointment he has undergone right second toe ray amputation. He has only posterior tibial runoff on that side, with occluded dorsalis pedis and peroneal artery. Has not had any problems with heart failure exacerbation, angina pectoris, palpitations or syncope.  Since his last appointment he has not received defibrillator therapy. He has no complaints related to the defibrillator surgical site. He has been compliant with remote downloads every 3 months. Interrogation shows normal device function. He has a Medtronic defibrillator implanted in 2009 (generator change 2016). Current estimated longevity of the generator is 9.9 years. They have been no episodes of ventricular tachycardia or deliver therapies. He has not had any episodes of atrial fibrillation since January. He does not require pacing. Activity level is fairly constant at 1.4 hours/day. His thoracic impedance fluctuates widely and does not show clear correlation with clinical hypervolemia. Regardless, it seems to be stabilizing at a new level over the last several weeks.   He has not had any bleeding problems or any focal neurological events or other signs of embolism.  Specifically asks if he needs to continue taking amiodarone. I told him that his device has not recorded any dangerous  or troublesome arrhythmia in over half a year it may be worthwhile considering a reduction in amiodarone dose, encouraged him to discuss this at his next appointment with Dr. Percival Spanish.  His defibrillator is a Medtronic Evera XT dual-chamber device (generator change February 2016, initial device and leads implanted in 2009: atrial lead [employer] [id number] serial number [id number], defibrillator lead Medtronic [id number] Sprint Quattro secure serial number [id number]). He reports 2 previous defibrillator shocks that each occurred roughly 4 years ago on separate occasions. He did not have syncope with either event. He believes that the defibrillator discharges were "appropriate". One happened while he was riding in an elevator, the other happened while he was walking at a slow pace. He also has a history of paroxysmal atrial fibrillation for which he takes amiodarone and warfarin. He is on combination vasodilator therapy for pulmonary artery hypertension, etiology uncertain. He is now seeing Dr. Spero Curb for this . He has a history of thyroidectomy following thyroid cancer and is on chronic thyroid hormone replacement. 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 He is currently receiving hemodialysis via a left antecubital AV fistula.  Past Medical History:  Diagnosis Date  . Atrial fibrillation ([place])   . Great toe amputation status ([place])    status post left hallux amputation 08/01/14  . HTN (hypertension)   . Hypothyroidism (acquired)   . ICD (implantable cardioverter-defibrillator) in place    Medtronic   . Nonischemic cardiomyopathy ([place])   . Pulmonary HTN ([place])   . Renal failure    Dialysis  .  Thyroid cancer ([place]) 2011    Past Surgical History:  Procedure Laterality Date  . DIALYSIS FISTULA CREATION    . HERNIA REPAIR    . KIDNEY TRANSPLANT    . LAPAROSCOPIC GASTRIC BANDING    .  REVISON OF ARTERIOVENOUS FISTULA Left [id number]   Procedure: PLICATION OF ARTERIOVENOUS FISTULA ANEURYSM;  Surgeon: Elam Dutch, MD;  Location: [facility];  Service: Vascular;  Laterality: Left;  . THROMBECTOMY W/ EMBOLECTOMY Left 12/25/2014   Procedure: THROMBECTOMY ARTERIOVENOUS FISTULA;  Surgeon: Elam Dutch, MD;  Location: [facility];  Service: Vascular;  Laterality: Left;  . THYROIDECTOMY    . Toe amputations      Current Medications: Outpatient Medications Prior to Visit  Medication Sig Dispense Refill  . ambrisentan (LETAIRIS) 10 MG tablet Take 1 tablet (10 mg total) by mouth daily. 30 tablet 2  . amiodarone (PACERONE) 200 MG tablet Take 200 mg by mouth daily.    Marland Kitchen atorvastatin (LIPITOR) 40 MG tablet Take 1 tablet (40 mg total) by mouth daily. 90 tablet 3  . calcium acetate (PHOSLO) 667 MG capsule Take by mouth.    . cinacalcet (SENSIPAR) 90 MG tablet Take 90 mg by mouth daily.    . clindamycin (CLEOCIN) 150 MG capsule Take 1 capsule (150 mg total) by mouth 4 (four) times daily. 40 capsule 0  . clindamycin (CLEOCIN) 150 MG capsule TAKE 1 CAPSULE (150 MG TOTAL) BY MOUTH 4 (FOUR) TIMES DAILY. 40 capsule 0  . collagenase (SANTYL) ointment Apply 1 application topically daily. 90 g 0  . levothyroxine (SYNTHROID, LEVOTHROID) 112 MCG tablet Take 112 mcg by mouth 2 (two) times daily.    . midodrine (PROAMATINE) 10 MG tablet Take 1 tablet (10 mg total) by mouth 3 (three) times daily. 90 tablet 11  . tadalafil, PAH, (ADCIRCA) 20 MG tablet 1 daily on dialysis days, and 2 daily on other days 60 tablet 11  . warfarin (COUMADIN) 6 MG tablet TAKE 1 TO 1.5 TABLETS BY MOUTH DAILY AS DIRECTED BY COUMADIN CLINIC 50 tablet 3   No facility-administered medications prior to visit.      Allergies:   Enalaprilat; Ivp dye [iodinated diagnostic agents]; and Vasotec [enalapril]   Social History   Social History  . Marital status: Married    Spouse name: N/A  . Number of children: 1  . Years of  education: N/A   Social History Main Topics  . Smoking status: Never Smoker  . Smokeless tobacco: Never Used  . Alcohol use No  . Drug use: No  . Sexual activity: Not Asked   Other Topics Concern  . None   Social History Narrative   Retired Archivist.       Family History:  The patient's family history includes CAD (age of onset: 110) in his mother; Emphysema in his mother; Heart failure in his mother; Hypertension in his father and mother; Kidney failure in his father.   ROS:   Please see the history of present illness.    ROS All other systems reviewed and are negative.   PHYSICAL EXAM:   VS:  BP 118/77   Pulse 75   Ht 6\' 1"  (1.854 m)   Wt 277 lb (125.6 kg)   BMI 36.55 kg/m    GEN: Severely obese, well developed, in no acute distress  HEENT: normal  Neck: no JVD, carotid bruits, or masses Cardiac: RRR; 2/6 holosystolic left lower sternal border murmurno  diastolic murmurs, rubs, or gallops,no edema; healthy  right subclavian ICD site; left antecubital AV fistula with good thrill/bruit   Respiratory:  clear to auscultation bilaterally, normal work of breathing GI: soft, nontender, nondistended, + BS MS: no deformity or atrophy  Skin: warm and dry, no rash Neuro:  Alert and Oriented x 3, Strength and sensation are intact Psych: euthymic mood, full affect  Wt Readings from Last 3 Encounters:  01/10/16 277 lb (125.6 kg)  12/04/15 272 lb 9.6 oz (123.7 kg)  12/03/15 272 lb (123.4 kg)      Studies/Labs Reviewed:   EKG:  EKG is not ordered today.  The intracardiac electrogramed today demonstrates atrial sensed, ventricular sensed (sinus) rhythm   Recent Labs: 08/13/2015: BUN 18; Creat 4.91; Hemoglobin 11.7; Platelets 128; Potassium 3.4; Sodium 137   Lipid Panel    Component Value Date/Time   CHOL 273 (H) 11/07/2014 0908   TRIG 284 (H) 11/07/2014 0908   HDL 30 (L) 11/07/2014 0908   CHOLHDL 9.1 11/07/2014 0908   VLDL 57 (H) 11/07/2014 0908   LDLCALC  186 (H) 11/07/2014 0908      ASSESSMENT:    1. Atrial fibrillation, unspecified type ([place])   2. Automatic implantable cardioverter-defibrillator in situ   3. Long term current use of amiodarone   4. Long-term (current) use of anticoagulants      PLAN:  In order of problems listed above:  1. AFib: None recorded in over 6 months. He is on appropriate anticoagulation with warfarin without leading complications. 2. ICD: Normal device function. No therapies delivered since we have been following him in [place]. Continue remote downloads every 3 months and office visit yearly. Still uncertain regarding the initial indication for the defibrillator as well as the nature of arrhythmia that triggered to previous defibrillator shocks (delivered by the previous device, now status post generator change out). 3. Amiodarone: No serious arrhythmia detected since he's been living in [place]. Has not had atrial fibrillation in many months. It is not unreasonable to discuss lowering the dose of amiodarone, but absent detailed knowledge of his previous arrhythmia history, I did not want to make any changes today without discussing with Dr. Percival Spanish. They will be seeing each other in about 3 weeks. 4. Warfarin: No serious bleeding problems    Medication Adjustments/Labs and Tests Ordered: Current medicines are reviewed at length with the patient today.  Concerns regarding medicines are outlined above.  Medication changes, Labs and Tests ordered today are listed in the Patient Instructions below. Patient Instructions  NO CHANGES WITH CURRENT TREATMENT OR MEDICATIONS  Remote monitoring is used to monitor your Pacemaker of ICD from home. This monitoring reduces the number of office visits required to check your device to one time per year. It allows [place] to keep an eye on the functioning of your device to ensure it is working properly. You are scheduled for a device check from home on 3 MONTHS (NOV 2017).  You may send your transmission at any time that day. If you have a wireless device, the transmission will be sent automatically. After your physician reviews your transmission, you will receive a postcard with your next transmission date.   Your physician wants you to follow-up in: [place]. You will receive a reminder letter in the mail two months in advance. If you don't receive a letter, please call our office to schedule the follow-up appointment.   If you need a refill on your cardiac medications before your next appointment, please call your pharmacy.  Signed, Sanda Klein, MD  01/10/2016 2:38 PM    [address], [place], [place]  [address] Phone: [phone]; Fax: [phone]

## 2016-01-15 ENCOUNTER — Encounter: Payer: Self-pay | Admitting: Cardiovascular Disease

## 2016-01-15 ENCOUNTER — Ambulatory Visit (INDEPENDENT_AMBULATORY_CARE_PROVIDER_SITE_OTHER): Payer: Medicare Other | Admitting: Cardiovascular Disease

## 2016-01-15 DIAGNOSIS — I998 Other disorder of circulatory system: Secondary | ICD-10-CM | POA: Diagnosis not present

## 2016-01-15 DIAGNOSIS — I779 Disorder of arteries and arterioles, unspecified: Secondary | ICD-10-CM

## 2016-01-15 DIAGNOSIS — I70229 Atherosclerosis of native arteries of extremities with rest pain, unspecified extremity: Secondary | ICD-10-CM

## 2016-01-15 NOTE — Assessment & Plan Note (Signed)
Adam [name] returns for follow-up of critical limb ischemia. His right second toe ray resection has slowly healed. His Dopplers performed on 12/12/15 showed a right ABI of 0.79 with two-vessel runoff and left ABI of 1.5. At this point, there is no need for further vascular evaluation and I will see him back on an as-needed basis.

## 2016-01-15 NOTE — Patient Instructions (Signed)
Medication Instructions:   NO CHANGES.  Follow-Up: Your physician recommends that you schedule a follow-up appointment ON AN AS NEEDED BASIS.   If you need a refill on your cardiac medications before your next appointment, please call your pharmacy.

## 2016-01-15 NOTE — Progress Notes (Signed)
Mr. Ohanian returns for follow-up of critical limb ischemia. His right second toe ray resection has slowly healed. His Dopplers performed on 12/12/15 showed a right ABI of 0.79 with two-vessel runoff and left ABI of 1.5. At this point, there is no need for further vascular evaluation and I will see him back on an as-needed basis.

## 2016-01-18 NOTE — Progress Notes (Signed)
DOS 10.28.2016 Left third metatarsal head resection/removal with wound debridement

## 2016-01-21 ENCOUNTER — Ambulatory Visit (INDEPENDENT_AMBULATORY_CARE_PROVIDER_SITE_OTHER): Payer: Medicare Other

## 2016-01-21 DIAGNOSIS — I428 Other cardiomyopathies: Secondary | ICD-10-CM

## 2016-01-21 DIAGNOSIS — I429 Cardiomyopathy, unspecified: Secondary | ICD-10-CM

## 2016-01-21 DIAGNOSIS — Z95810 Presence of automatic (implantable) cardiac defibrillator: Secondary | ICD-10-CM

## 2016-01-21 NOTE — Progress Notes (Signed)
EPIC Encounter for ICM Monitoring  Patient Name: Adam [name] is a 55 y.o. male Date: 01/21/2016 Primary Care Physican: Dimas Chyle, MD Primary Cardiologist: Hochrein Electrophysiologist: Croitoru Nephrologist: Marcina Millard Weight: 271 lbs  Heart Failure questions reviewed, pt asymptomatic.  Continues with dialysis 3 days a week. He reported he feels fine.   Thoracic impedance continues same pattern and suggesting fluid accumulation.  Recommendations: No changes.  Patient on renal diet.  Per previous conversations with [place] at [phone], patient does not usually allow more than 2-3 kg to be removed during dialysis. Baseline weight is 123 kg.   Follow-up plan: ICM clinic phone appointment on 02/21/2016.  Copy of ICM check sent to primary cardiologist and device physician.   ICM trend: 01/21/2016       Rosalene Billings, RN 01/21/2016 11:23 AM

## 2016-01-21 NOTE — Progress Notes (Signed)
Sounds like we may need to stop monitoring, Margarita Grizzle. Not sure we are having any impact.

## 2016-01-23 LAB — PROTIME-INR: INR: 2.2 — AB (ref 0.9–1.1)

## 2016-01-23 NOTE — Progress Notes (Signed)
Will follow up next month and then discontinue from [facility] program.

## 2016-01-25 ENCOUNTER — Ambulatory Visit (INDEPENDENT_AMBULATORY_CARE_PROVIDER_SITE_OTHER): Payer: Medicare Other | Admitting: Podiatry

## 2016-01-25 ENCOUNTER — Ambulatory Visit (INDEPENDENT_AMBULATORY_CARE_PROVIDER_SITE_OTHER): Payer: Medicare Other | Admitting: Pharmacist Clinician (PhC)/ Clinical Pharmacy Specialist

## 2016-01-25 DIAGNOSIS — I739 Peripheral vascular disease, unspecified: Secondary | ICD-10-CM

## 2016-01-25 DIAGNOSIS — Z7901 Long term (current) use of anticoagulants: Secondary | ICD-10-CM

## 2016-01-25 DIAGNOSIS — Z09 Encounter for follow-up examination after completed treatment for conditions other than malignant neoplasm: Secondary | ICD-10-CM

## 2016-01-25 DIAGNOSIS — L84 Corns and callosities: Secondary | ICD-10-CM

## 2016-01-25 DIAGNOSIS — L97511 Non-pressure chronic ulcer of other part of right foot limited to breakdown of skin: Secondary | ICD-10-CM

## 2016-01-25 DIAGNOSIS — I4891 Unspecified atrial fibrillation: Secondary | ICD-10-CM

## 2016-01-28 ENCOUNTER — Other Ambulatory Visit: Payer: Medicare Other

## 2016-01-31 NOTE — Progress Notes (Signed)
HPI The patient presents as a new patient. He moved here from [place]. He has end-stage renal disease secondary to uncontrolled hypertension and apparent glomerulonephritis.   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 He is on dialysis.  He was seen at [facility] for part of his transplant evaluation.  Echocardiogram demonstrated well-preserved ejection fraction with some moderate tricuspid regurgitation. He had a right heart catheterization and his pulmonary pressures were actually quite good with his pulmonary vascular resistance being 2.2 [place] units.    Perfusion study was negative for evidence of chronic embolism.   He returns for follow up.  Since I last saw him he has seen Dr. Gwenlyn Found for critical limb ischemia and Dr. Sallyanne Kuster for his ICD follow up.  He was asking about possibly stopping his amiodarone.  He has had no device firings or evidence of sustained Tach.    Since I last saw him he has done well.  The patient denies any new symptoms such as chest discomfort, neck or arm discomfort. There has been no new shortness of breath, PND or orthopnea. There have been no reported palpitations, presyncope or syncope.  Even though his blood pressure runs low but he does exceptionally well with this and only takes his Midodrone once on dialysis days.     Allergies  Allergen Reactions  . Enalaprilat Swelling  . Ivp Dye [Iodinated Diagnostic Agents] Nausea And Vomiting  . Vasotec [Enalapril] Swelling    Current Outpatient Prescriptions  Medication Sig Dispense Refill  . ambrisentan (LETAIRIS) 10 MG tablet Take 1 tablet (10 mg total) by mouth daily. 30 tablet 2  . amiodarone (PACERONE) 200 MG tablet Take 200 mg by mouth daily.    Marland Kitchen atorvastatin (LIPITOR) 40 MG tablet Take 1 tablet (40 mg total) by mouth daily. 90 tablet 3  . calcium acetate (PHOSLO) 667 MG capsule Take by mouth.    . cinacalcet (SENSIPAR) 90 MG tablet Take 90 mg by mouth daily.    Marland Kitchen  levothyroxine (SYNTHROID, LEVOTHROID) 112 MCG tablet Take 112 mcg by mouth 2 (two) times daily.    . midodrine (PROAMATINE) 10 MG tablet Take 1 tablet (10 mg total) by mouth 3 (three) times daily. 90 tablet 11  . tadalafil, PAH, (ADCIRCA) 20 MG tablet 1 daily on dialysis days, and 2 daily on other days 60 tablet 11  . warfarin (COUMADIN) 6 MG tablet TAKE 1 TO 1.5 TABLETS BY MOUTH DAILY AS DIRECTED BY COUMADIN CLINIC 50 tablet 3   No current facility-administered medications for this visit.     Past Medical History:  Diagnosis Date  . Atrial fibrillation ([place])   . Great toe amputation status ([place])    status post left hallux amputation 08/01/14  . HTN (hypertension)   . Hypothyroidism (acquired)   . ICD (implantable cardioverter-defibrillator) in place    Medtronic   . Nonischemic cardiomyopathy ([place])   . Pulmonary HTN ([place])   . Renal failure    Dialysis  . Thyroid cancer ([place]) 2011    Past Surgical History:  Procedure Laterality Date  . DIALYSIS FISTULA CREATION    . HERNIA REPAIR    . KIDNEY TRANSPLANT    . LAPAROSCOPIC GASTRIC BANDING    . REVISON OF ARTERIOVENOUS FISTULA Left 1/74/0814   Procedure: PLICATION OF ARTERIOVENOUS FISTULA ANEURYSM;  Surgeon: Elam Dutch, MD;  Location: [facility];  Service: Vascular;  Laterality: Left;  . THROMBECTOMY W/ EMBOLECTOMY Left 12/25/2014   Procedure:  THROMBECTOMY ARTERIOVENOUS FISTULA;  Surgeon: Elam Dutch, MD;  Location: [facility];  Service: Vascular;  Laterality: Left;  . THYROIDECTOMY    . Toe amputations      ROS:  As stated in the HPI and negative for all other systems.  PHYSICAL EXAM BP (!) 72/46   Pulse 71   Ht 6\' 1"  (1.854 m)   Wt 275 lb (124.7 kg)   BMI 36.28 kg/m   GENERAL:  Well appearing NECK:  No jugular venous distention, waveform within normal limits, carotid upstroke brisk and symmetric, no bruits, no thyromegaly LUNGS:  Clear to auscultation bilaterally BACK:  No CVA tenderness CHEST:  ICD pocket left  side HEART:  PMI not displaced or sustained,S1 and S2 within normal limits, no S3, no S4, no clicks, no rubs, 2/6 Aholosystolic murmur her in particular it is left upper and right upper sternal borders, no diastolic murmurs ABD:  Flat, positive bowel sounds normal in frequency in pitch, no bruits, no rebound, no guarding, no midline pulsatile mass, no hepatomegaly, no splenomegaly EXT:  2 plus pulses throughout, no edema, no cyanosis no clubbing dialysis fistula left upper arm, amputation second toe right foot   ASSESSMENT AND PLAN  CARDIOMYOPATHY:   The EF is preserved.  He will continue on the meds as listed.     ATRIAL FIB:  He has been maintaining NSR.  I will order TSH and liver enzymes.  He tolerates anticoagulation.  No change in therapy is indicated.    Mr. Dreyden Rohrman has a CHA2DS2 - VASc score of 2.    ICD:  He is up to date with follow up.   PULMONARY HTN:  This seems to be well treated on the current meds.  No change in therapy is indicated.   HYPOTENSION:  The blood pressure is low.  However he has no symptoms with this.  He will have no change in therapy but will let me know if he becomes symptomatic.

## 2016-02-01 ENCOUNTER — Ambulatory Visit (INDEPENDENT_AMBULATORY_CARE_PROVIDER_SITE_OTHER): Payer: Medicare Other | Admitting: Cardiology

## 2016-02-01 ENCOUNTER — Encounter: Payer: Self-pay | Admitting: Cardiology

## 2016-02-01 VITALS — BP 72/46 | HR 71 | Ht 73.0 in | Wt 275.0 lb

## 2016-02-01 DIAGNOSIS — I779 Disorder of arteries and arterioles, unspecified: Secondary | ICD-10-CM | POA: Diagnosis not present

## 2016-02-01 DIAGNOSIS — I950 Idiopathic hypotension: Secondary | ICD-10-CM

## 2016-02-01 DIAGNOSIS — Z95810 Presence of automatic (implantable) cardiac defibrillator: Secondary | ICD-10-CM

## 2016-02-01 DIAGNOSIS — Z79899 Other long term (current) drug therapy: Secondary | ICD-10-CM

## 2016-02-01 DIAGNOSIS — I998 Other disorder of circulatory system: Secondary | ICD-10-CM | POA: Diagnosis not present

## 2016-02-01 DIAGNOSIS — I70229 Atherosclerosis of native arteries of extremities with rest pain, unspecified extremity: Secondary | ICD-10-CM

## 2016-02-01 LAB — TSH: TSH: 3.35 mIU/L (ref 0.40–4.50)

## 2016-02-01 NOTE — Progress Notes (Signed)
Patient ID: Traeson Dusza, Adam [name]   DOB: [birth year], 55 y.o.   MRN: [id number]  Subjective: Jorryn Casagrande is a 55 y.o. is seen today in office s/p right partial 3rd ray amputation and 4th metatarsal head resection. He states that he is healing well. He has not had any drainage or pus or any redness or swelling to the foot denies any pain. He has not been applying a dressing to the foot as he believes that the wound is healed. Denies any systemic complaints such as fevers, chills, nausea, vomiting. No calf pain, chest pain, shortness of breath.   Objective: General: No acute distress, AAOx3  DP/PT pulses palpable 2/4, CRT < 3 sec to all digits.  Motor function intact to remaining digits.  Right foot foot: Incision on the dorsal aspect of the foot appears to be almost completely healed. They're superficial abrasion type wound along the incision how there is no probing, undermining or tunneling. There is no swelling erythema, ascending cellulitis. There is no fluctuance or crepitus there is no malodor. Hyperkeratotic lesion to the fifth toe. No underlying ulceration, drainage or any signs of infection. No other open lesions or pre-ulcerative lesions.  No pain with calf compression, swelling, warmth, erythema.   Assessment and Plan:  Status post right foot surgery, amputation to hyperkeratotic lesion.  -Treatment options discussed including all alternatives, risks, and complications -This time the wound appears to be was completely healed. Recommended continue with a small metastatic antibiotic ointment overlying the area daily. Monitor for signs or symptoms of infection. Continue surgical shoe for now. Hyperkeratotic lesion fifth toe debrided without, occasions or bleeding. Monitor for signs or symptoms of infection of the ER should any occur. Follow up as scheduled or sooner if needed.  Celesta Gentile, DPM

## 2016-02-01 NOTE — Patient Instructions (Signed)
Medication Instructions:  Continue current medications  Labwork: TSH and Liver function  Testing/Procedures: None Ordered  Follow-Up: Your physician wants you to follow-up in: February 2018. You will receive a reminder letter in the mail two months in advance. If you don't receive a letter, please call our office to schedule the follow-up appointment.   Any Other Special Instructions Will Be Listed Below (If Applicable).   If you need a refill on your cardiac medications before your next appointment, please call your pharmacy.

## 2016-02-02 LAB — HEPATIC FUNCTION PANEL
ALBUMIN: 3.8 g/dL (ref 3.6–5.1)
ALK PHOS: 90 U/L (ref 40–115)
ALT: 10 U/L (ref 9–46)
AST: 18 U/L (ref 10–35)
BILIRUBIN INDIRECT: 0.4 mg/dL (ref 0.2–1.2)
BILIRUBIN TOTAL: 0.5 mg/dL (ref 0.2–1.2)
Bilirubin, Direct: 0.1 mg/dL (ref <=0.2)
Total Protein: 6.7 g/dL (ref 6.1–8.1)

## 2016-02-06 LAB — PROTIME-INR: INR: 1.8 — AB (ref 0.9–1.1)

## 2016-02-08 ENCOUNTER — Ambulatory Visit (INDEPENDENT_AMBULATORY_CARE_PROVIDER_SITE_OTHER): Payer: Medicare Other | Admitting: Pharmacist Clinician (PhC)/ Clinical Pharmacy Specialist

## 2016-02-08 DIAGNOSIS — Z7901 Long term (current) use of anticoagulants: Secondary | ICD-10-CM

## 2016-02-08 DIAGNOSIS — I4891 Unspecified atrial fibrillation: Secondary | ICD-10-CM

## 2016-02-12 LAB — CUP PACEART INCLINIC DEVICE CHECK
Battery Remaining Longevity: 119 mo
Battery Voltage: 3.01 V
Brady Statistic AP VP Percent: 0.01 %
Brady Statistic AS VS Percent: 99.45 %
Brady Statistic RA Percent Paced: 0.46 %
Brady Statistic RV Percent Paced: 0.09 %
Date Time Interrogation Session: 20170831115836
HIGH POWER IMPEDANCE MEASURED VALUE: 46 Ohm
HighPow Impedance: 44 Ohm
Implantable Lead Location: 753859
Implantable Lead Location: 753860
Implantable Lead Model: 6947
Lead Channel Impedance Value: 779 Ohm
Lead Channel Pacing Threshold Pulse Width: 0.4 ms
Lead Channel Sensing Intrinsic Amplitude: 1.875 mV
Lead Channel Sensing Intrinsic Amplitude: 7.75 mV
Lead Channel Sensing Intrinsic Amplitude: 7.75 mV
Lead Channel Setting Pacing Amplitude: 4.75 V
Lead Channel Setting Pacing Pulse Width: 0.4 ms
MDC IDC LEAD IMPLANT DT: 20090109
MDC IDC LEAD IMPLANT DT: 20090109
MDC IDC MSMT LEADCHNL RA IMPEDANCE VALUE: 532 Ohm
MDC IDC MSMT LEADCHNL RA PACING THRESHOLD AMPLITUDE: 2.5 V
MDC IDC MSMT LEADCHNL RA SENSING INTR AMPL: 1.875 mV
MDC IDC MSMT LEADCHNL RV IMPEDANCE VALUE: 323 Ohm
MDC IDC MSMT LEADCHNL RV PACING THRESHOLD AMPLITUDE: 2 V
MDC IDC MSMT LEADCHNL RV PACING THRESHOLD PULSEWIDTH: 0.4 ms
MDC IDC SET LEADCHNL RV PACING AMPLITUDE: 4.25 V
MDC IDC SET LEADCHNL RV SENSING SENSITIVITY: 0.3 mV
MDC IDC STAT BRADY AP VS PERCENT: 0.46 %
MDC IDC STAT BRADY AS VP PERCENT: 0.08 %

## 2016-02-13 ENCOUNTER — Encounter: Payer: Self-pay | Admitting: Cardiology

## 2016-02-14 ENCOUNTER — Telehealth: Payer: Self-pay | Admitting: Pulmonary Disease

## 2016-02-14 NOTE — Telephone Encounter (Signed)
Spoke with [facility] and clarified Designer, television/film set. She will contact pt and let him know dosage/instructions has not changed. Nothing further needed.

## 2016-02-15 ENCOUNTER — Other Ambulatory Visit: Payer: Self-pay

## 2016-02-15 MED ORDER — AMBRISENTAN 10 MG PO TABS: 10.0000 mg | ORAL_TABLET | Freq: Every day | ORAL | 11 refills | Status: DC

## 2016-02-18 LAB — PROTIME-INR: INR: 1.7 — AB (ref 0.9–1.1)

## 2016-02-20 ENCOUNTER — Ambulatory Visit (INDEPENDENT_AMBULATORY_CARE_PROVIDER_SITE_OTHER): Payer: Medicare Other | Admitting: Pharmacist

## 2016-02-20 DIAGNOSIS — I4891 Unspecified atrial fibrillation: Secondary | ICD-10-CM

## 2016-02-20 DIAGNOSIS — Z7901 Long term (current) use of anticoagulants: Secondary | ICD-10-CM

## 2016-02-21 ENCOUNTER — Ambulatory Visit (INDEPENDENT_AMBULATORY_CARE_PROVIDER_SITE_OTHER): Payer: Medicare Other

## 2016-02-21 DIAGNOSIS — Z95810 Presence of automatic (implantable) cardiac defibrillator: Secondary | ICD-10-CM

## 2016-02-21 DIAGNOSIS — I428 Other cardiomyopathies: Secondary | ICD-10-CM

## 2016-02-22 ENCOUNTER — Ambulatory Visit (INDEPENDENT_AMBULATORY_CARE_PROVIDER_SITE_OTHER): Payer: Medicare Other

## 2016-02-22 ENCOUNTER — Ambulatory Visit (INDEPENDENT_AMBULATORY_CARE_PROVIDER_SITE_OTHER): Payer: Medicare Other | Admitting: Podiatry

## 2016-02-22 DIAGNOSIS — Z9889 Other specified postprocedural states: Secondary | ICD-10-CM

## 2016-02-22 DIAGNOSIS — M86679 Other chronic osteomyelitis, unspecified ankle and foot: Secondary | ICD-10-CM

## 2016-02-22 NOTE — Progress Notes (Signed)
EPIC Encounter for ICM Monitoring  Patient Name: Adam [name] is a 55 y.o. male Date: 02/22/2016 Primary Care Physican: Dimas Chyle, MD Primary Cardiologist:Hochrein Electrophysiologist: Croitoru Nephrologist: Mercy Moore Dry Weight:    271 lbs      Heart Failure questions reviewed, pt asymptomatic.  Continues dialysis 3 times a week.  He stated he feels good.   Thoracic impedance abnormal suggesting fluid accumulation but stable.  Recommendations: No changes    Follow-up plan: As recommended by Dr Sallyanne Kuster no further ICM follow up is needed since he is stable with dialysis and unable to make any further impact.  Patient stated he understood.  Advised him to call Dr Croitoru's office for any follow up needed for heart.  Explained the remote transmission will still be checked every 91 days and any alerts  Copy of ICM check sent to device physician.   ICM trend: 02/22/2016       Rosalene Billings, RN 02/22/2016 3:56 PM

## 2016-02-28 NOTE — Progress Notes (Signed)
Patient ID: Adam [name], male   DOB: August 16, 1960, 55 y.o.   MRN: [id number]  Subjective: Adam [name] is a 55 y.o. is seen today in office s/p right partial 3rd ray amputation and 4th metatarsal head resection. He believes the wound has healed.  He has not had any drainage or pus or any redness or swelling to the foot denies any pain. He has not been applying a dressing to the foot as he believes that the wound is healed. Denies any systemic complaints such as fevers, chills, nausea, vomiting. No calf pain, chest pain, shortness of breath.   Objective: General: No acute distress, AAOx3  DP/PT pulses palpable 2/4, CRT < 3 sec to all digits.  Motor function intact to remaining digits.  Right foot foot: Incision on the dorsal aspect of the foot is healed and a scar has formed. Hyperkeratotic tissue is overlying the area and upon debridement the incision appears healed. There is no open sores identified. There is no drainage or pus or any swelling or redness from the warmth. Hyperkeratotic lesion to the fifth toe. No underlying ulceration, drainage or any signs of infection. No other open lesions or pre-ulcerative lesions.  No pain with calf compression, swelling, warmth, erythema.   Assessment and Plan:  Status post right foot surgery, amputation and hyperkeratotic lesion.  -Treatment options discussed including all alternatives, risks, and complications -X-rays obtained and reviewed. No evidence of acute osteomyelitis identified at this time. -Hyperkeratotic tissue overlying the incision as well as the fifth toe was debrided without complications or bleeding. There is no underlying ulceration in the incision appears to be healed. At this point with discharged from postoperative care and I'll see him back on a 3 month basis. The meantime encouraged to call any questions, concerns or any changes symptoms.  Celesta Gentile, DPM

## 2016-03-03 LAB — PROTIME-INR: INR: 2.2 — AB (ref 0.9–1.1)

## 2016-03-04 ENCOUNTER — Ambulatory Visit (INDEPENDENT_AMBULATORY_CARE_PROVIDER_SITE_OTHER): Payer: Medicare Other | Admitting: Pulmonary Disease

## 2016-03-04 ENCOUNTER — Encounter: Payer: Self-pay | Admitting: Pulmonary Disease

## 2016-03-04 VITALS — BP 122/70 | HR 75 | Ht 73.0 in | Wt 283.4 lb

## 2016-03-04 DIAGNOSIS — R040 Epistaxis: Secondary | ICD-10-CM | POA: Diagnosis not present

## 2016-03-04 DIAGNOSIS — Z5181 Encounter for therapeutic drug level monitoring: Secondary | ICD-10-CM

## 2016-03-04 DIAGNOSIS — I272 Pulmonary hypertension, unspecified: Secondary | ICD-10-CM | POA: Diagnosis not present

## 2016-03-04 DIAGNOSIS — I779 Disorder of arteries and arterioles, unspecified: Secondary | ICD-10-CM | POA: Diagnosis not present

## 2016-03-04 NOTE — Patient Instructions (Signed)
We will arrange for a 6 minute walk, echocardiogram, and blood test when you return in 6 months Keep taking your Letairis 10 mg daily Keep taking Adcirca 20 mg on dialysis days, 40 mg on nondialysis days We will refer you to your nose and throat for the epistaxis I will see you back in 6 months

## 2016-03-04 NOTE — Assessment & Plan Note (Signed)
This is a new problem, he says it's related to his chronic sinus disease and anticoagulation.  He is requesting a referral to ear nose and throat  Plan: Referral placed

## 2016-03-04 NOTE — Assessment & Plan Note (Signed)
He has done better since we increased his dry weight to 124 kg. He continues to take Letairis 10 mg daily, Adcirca 20 mg on dialysis days, 40 mg on nondialysis days. With this regimen he is not having dizziness and his blood pressure has been better.  His right heart catheterization performed at [facility] in April 2017 showed that his current regimen is adequate. He is not showing signs of heart failure at this time. I see no indication to change his pulmonary vasodilator regimen based on these numbers.  Plan: Repeat 6 minute walk in 6 months Repeat echocardiogram in 6 months Continue Letairis 10 mg daily Continue Adcirca 20 mg on dialysis days, 40 mg on nondialysis days LFTs in 6 months Follow-up 6 months

## 2016-03-04 NOTE — Progress Notes (Signed)
Subjective:    Patient ID: Adam [name], male    DOB: [birth year], 55 y.o.   MRN: [id number]  Synopsis: Diagnosed with pulmonary hypertension in 2012. 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 Unfortunately his kidney transplant failed for uncertain reasons by 0092. Was diagnosed with pulmonary hypertension by 2012 in [place]. Was started on [place] in [place] and moved to [place] not long afterwards. Developed atrial fibrillation around 2009, had a defibrillator placed.  08/2015 [facility]: Cardiac Index (l/min/m2) 3.7 L/min/m2  Right Atrium Mean Pressure (mmHg) 5 mmHg   Right Ventricle Systolic Pressure (mmHg) 46 mmHg   Pulmonary Artery Mean Pressure (mmHg) 31 mmHg   Pulmonary Wedge Pressure (mmHg) 11 mmHg   Pulmonary Vascular Resistance (Wood units) 2.2 Wood units   He had a second evaluation for renal transplant at [facility] in 2017 but was denied because of his hypertension and atrial fibrillation.  HPI Chief Complaint  Patient presents with  . Follow-up    pt has cough with bright red blood     Adam [name] is coughing up blood.  He says that a few years ago he had sinus surgery and that he has epistaxis from time to time with this.  He says that this time of year when the weather changes he has more sinus symptoms then he gets epistaxis.    He is not coughing up the blood without   His blood pressure has been better lately since they have been running his dry weight a little higher. He is taking Letairis 10mg  and Adcirca (20mg , one pill on dialysis, 2 non dialysis).He says he has not been having trouble with dizziness since they have been running his dry weight heavier 124 kg per  Current dry weight 124 Kg.   Past Medical History:  Diagnosis Date  . Atrial fibrillation ([place])   . 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 HTN (hypertension)   . Hypothyroidism (acquired)   . ICD  (implantable cardioverter-defibrillator) in place    Medtronic   . Nonischemic cardiomyopathy ([place])   . Pulmonary HTN   . Renal failure    Dialysis  . Thyroid cancer ([place]) 2011      Review of Systems  Constitutional: Negative for chills, fatigue and fever.  HENT: Negative for postnasal drip, rhinorrhea and sinus pressure.   Respiratory: Negative for cough, shortness of breath and wheezing.   Cardiovascular: Negative for chest pain, palpitations and leg swelling.       Objective:   Physical Exam Vitals:   03/04/16 1446  BP: 122/70  Pulse: 75  SpO2: 99%  Weight: 283 lb 6.4 oz (128.5 kg)  Height: 6\' 1"  (1.854 m)   RA  Gen: well appearing HENT: OP clear,  neck supple PULM: CTA B, normal percussion CV: RRR, slight systolic murmur trace edema GI: BS+, soft, nontender Derm: no cyanosis or rash Psyche: normal mood and affect  March 2017 6 minute walk test at [facility] 385 m, O2 saturation 100% on room air March 2017 echocardiogram [facility] RVSP 40 mmHg, LVEF 55% April 2017 right heart catheterization [facility] cardiac index 3.7, right atrial pressure 5, RVSP 46, mean PA pressure 31, pulmonary capillary wedge pressure 11, pulmonary vascular resistance 2.2  Records from the [facility] pulmonary hypertension clinic and transplantation clinic reviewed.  Liver function testing at [facility] in April 2017 normal with the  exception of a low ALT.  01/2016 LFT> normal    Assessment & Plan:  Pulmonary hypertension ([place]) He has done better since we increased his dry weight to 124 kg. He continues to take Letairis 10 mg daily, Adcirca 20 mg on dialysis days, 40 mg on nondialysis days. With this regimen he is not having dizziness and his blood pressure has been better.  His right heart catheterization performed at [facility] in April 2017 showed that his current regimen is adequate. He is not showing signs of heart failure at this time. I see no indication to  change his pulmonary vasodilator regimen based on these numbers.  Plan: Repeat 6 minute walk in 6 months Repeat echocardiogram in 6 months Continue Letairis 10 mg daily Continue Adcirca 20 mg on dialysis days, 40 mg on nondialysis days LFTs in 6 months Follow-up 6 months  Epistaxis This is a new problem, he says it's related to his chronic sinus disease and anticoagulation.  He is requesting a referral to ear nose and throat  Plan: Referral placed  > 50 % of this visit spent face to face, 28 minute visit  Current Outpatient Prescriptions:  .  ambrisentan (LETAIRIS) 10 MG tablet, Take 1 tablet (10 mg total) by mouth daily., Disp: 30 tablet, Rfl: 11 .  amiodarone (PACERONE) 200 MG tablet, Take 200 mg by mouth daily., Disp: , Rfl:  .  atorvastatin (LIPITOR) 40 MG tablet, Take 1 tablet (40 mg total) by mouth daily., Disp: 90 tablet, Rfl: 3 .  calcium acetate (PHOSLO) 667 MG capsule, Take by mouth., Disp: , Rfl:  .  cinacalcet (SENSIPAR) 90 MG tablet, Take 90 mg by mouth daily., Disp: , Rfl:  .  levothyroxine (SYNTHROID, LEVOTHROID) 112 MCG tablet, Take 112 mcg by mouth 2 (two) times daily., Disp: , Rfl:  .  midodrine (PROAMATINE) 10 MG tablet, Take 1 tablet (10 mg total) by mouth 3 (three) times daily., Disp: 90 tablet, Rfl: 11 .  tadalafil, PAH, (ADCIRCA) 20 MG tablet, 1 daily on dialysis days, and 2 daily on other days, Disp: 60 tablet, Rfl: 11 .  warfarin (COUMADIN) 6 MG tablet, TAKE 1 TO 1.5 TABLETS BY MOUTH DAILY AS DIRECTED BY COUMADIN CLINIC, Disp: 50 tablet, Rfl: 3

## 2016-03-05 ENCOUNTER — Ambulatory Visit (INDEPENDENT_AMBULATORY_CARE_PROVIDER_SITE_OTHER): Payer: Medicare Other | Admitting: Pharmacist

## 2016-03-05 DIAGNOSIS — Z7901 Long term (current) use of anticoagulants: Secondary | ICD-10-CM

## 2016-03-05 DIAGNOSIS — I4891 Unspecified atrial fibrillation: Secondary | ICD-10-CM

## 2016-03-17 ENCOUNTER — Telehealth: Payer: Self-pay | Admitting: Cardiology

## 2016-03-17 NOTE — Telephone Encounter (Signed)
Adam [name] is calling because he is wanting Dr. Percival Spanish to refer him to a new primary care doctor .Marland Kitchen Thanks

## 2016-03-17 NOTE — Telephone Encounter (Signed)
Spoke w patient. He sees Dr. Jerline Pain with [facility].  Patient does want to stay in the [facility] system but requests Dr. Rosezella [place] recommendation for a new PCP. Aware I will route to advise.

## 2016-03-18 ENCOUNTER — Ambulatory Visit: Payer: Medicare Other | Admitting: Internal Medicine

## 2016-03-21 NOTE — Telephone Encounter (Signed)
Please give him a list of [facility] primary [facility] who are accepting new patients.

## 2016-03-24 ENCOUNTER — Ambulatory Visit (INDEPENDENT_AMBULATORY_CARE_PROVIDER_SITE_OTHER): Payer: Medicare Other | Admitting: Family Medicine

## 2016-03-24 ENCOUNTER — Encounter: Payer: Self-pay | Admitting: Family Medicine

## 2016-03-24 VITALS — BP 96/55 | HR 70 | Temp 97.8°F | Wt 273.0 lb

## 2016-03-24 DIAGNOSIS — Z1159 Encounter for screening for other viral diseases: Secondary | ICD-10-CM | POA: Diagnosis not present

## 2016-03-24 DIAGNOSIS — E7800 Pure hypercholesterolemia, unspecified: Secondary | ICD-10-CM | POA: Diagnosis not present

## 2016-03-24 DIAGNOSIS — Z114 Encounter for screening for human immunodeficiency virus [HIV]: Secondary | ICD-10-CM

## 2016-03-24 DIAGNOSIS — Z85850 Personal history of malignant neoplasm of thyroid: Secondary | ICD-10-CM

## 2016-03-24 DIAGNOSIS — E785 Hyperlipidemia, unspecified: Secondary | ICD-10-CM

## 2016-03-24 DIAGNOSIS — I779 Disorder of arteries and arterioles, unspecified: Secondary | ICD-10-CM | POA: Diagnosis not present

## 2016-03-24 LAB — LIPID PANEL
CHOL/HDL RATIO: 3.7 ratio (ref <5.0)
Cholesterol: 149 mg/dL (ref <200)
HDL: 40 mg/dL — AB (ref >40)
LDL CALC: 81 mg/dL (ref <100)
TRIGLYCERIDES: 142 mg/dL (ref <150)
VLDL: 28 mg/dL (ref <30)

## 2016-03-24 NOTE — Assessment & Plan Note (Signed)
Continue atorvastatin 40 mg daily.  Check lipid panel today. 

## 2016-03-24 NOTE — Patient Instructions (Signed)
We will refer you to the cancer center for your thyroid cancer history.  We will check blood work today.  Please come back in 2-3 months for your next visit.  Take care,  Dr Jerline Pain

## 2016-03-24 NOTE — Assessment & Plan Note (Signed)
Referral placed to oncology today.

## 2016-03-24 NOTE — Progress Notes (Signed)
   Subjective:  Adam [name] is a 55 y.o. male who presents to the [facility] today with a chief complaint of thyroid cancer.   HPI:  Thyroid Cancer.  Had an oncologist in [place]. Last saw them last year. Was told that everything was going "ok." Was diagnosed with thyroid cancer in 2011 Has a history of a thyroidectomy. Tried calling the cancer center and was told that he needed a referral. Patient is not sure the specific type of thyroid cancer he had.  HLD Tolerating atorvastatin without side effects.   ROS: Per HPI  PMH: Smoking history reviewed.    Objective:  Physical Exam: BP (!) 96/55   Pulse 70   Temp 97.8 F (36.6 C) (Oral)   Wt 273 lb (123.8 kg)   SpO2 100%   BMI 36.02 kg/m   Gen: NAD, resting comfortable CV: RRR 2/6 systolic murmur noted.  Pulm: NWOB, CTAB with no crackles, wheezes, or rhonchi GI: Obese, Normal bowel sounds present. Soft, Nontender, Nondistended. MSK: AV fistula in LUE with palpable thrill. Skin: warm, dry Neuro: grossly normal, moves all extremities Psych: Normal affect and thought content  Assessment/Plan:  History of thyroid cancer Referral placed to oncology today.   Pure hypercholesterolemia Continue atorvastatin 40mg  daily. Check lipid panel today.    Algis Greenhouse. Jerline Pain, [place] [facility] Resident PGY-3 03/24/2016 4:24 PM

## 2016-03-25 ENCOUNTER — Encounter: Payer: Self-pay | Admitting: Family Medicine

## 2016-03-25 LAB — HEPATITIS C ANTIBODY: HCV Ab: NEGATIVE

## 2016-03-25 LAB — HIV ANTIBODY (ROUTINE TESTING W REFLEX): HIV 1&2 Ab, 4th Generation: NONREACTIVE

## 2016-03-26 LAB — PROTIME-INR: INR: 2.4 — AB (ref 0.9–1.1)

## 2016-03-28 ENCOUNTER — Ambulatory Visit (INDEPENDENT_AMBULATORY_CARE_PROVIDER_SITE_OTHER): Payer: Medicare Other | Admitting: Pharmacist

## 2016-03-28 DIAGNOSIS — Z7901 Long term (current) use of anticoagulants: Secondary | ICD-10-CM

## 2016-03-28 DIAGNOSIS — I4891 Unspecified atrial fibrillation: Secondary | ICD-10-CM

## 2016-04-07 ENCOUNTER — Encounter: Payer: Self-pay | Admitting: Family Medicine

## 2016-04-08 ENCOUNTER — Encounter (HOSPITAL_COMMUNITY): Payer: Self-pay | Admitting: Family Medicine

## 2016-04-08 ENCOUNTER — Telehealth: Payer: Self-pay | Admitting: *Deleted

## 2016-04-08 NOTE — Telephone Encounter (Signed)
"  I'mm calling to provide information about my provider in [place] to schedule appointment."  Call transferred ext [phone].

## 2016-04-09 ENCOUNTER — Other Ambulatory Visit: Payer: Self-pay | Admitting: Family Medicine

## 2016-04-09 DIAGNOSIS — Z85850 Personal history of malignant neoplasm of thyroid: Secondary | ICD-10-CM

## 2016-04-10 ENCOUNTER — Telehealth: Payer: Self-pay | Admitting: Pulmonary Disease

## 2016-04-10 NOTE — Telephone Encounter (Signed)
Called and spoke with Levada Dy at [facility]  She is calling to check on the status of Letairis rx  She states that it was faxed on 04/04/16  Ash, do you have this? Please advise, thanks!

## 2016-04-14 ENCOUNTER — Encounter: Payer: Medicare Other | Admitting: *Deleted

## 2016-04-16 NOTE — Telephone Encounter (Signed)
Doris from [facility] is calling on behalf of the form. Doris stated patient does not need to sign the form. Please fax it to [phone].

## 2016-04-16 NOTE — Telephone Encounter (Signed)
Form has been received and filled out.  This needs to be signed by patient before it is faxed back.  lmtcb X1 for pt to come in and sign form.    Form is held in brown accordion file up front.

## 2016-04-16 NOTE — Telephone Encounter (Signed)
Caryl Pina do you have this request? If not, we can request that it be sent again. Thanks.

## 2016-04-16 NOTE — Telephone Encounter (Signed)
Called and spoke with rep at [facility] and verified that the form does not need to be signed by the patient since it is a continuation of therapy. Will send to Dr Lake Bells to sign form and give to triage to fax out. Form is in Network engineer.

## 2016-04-17 NOTE — Telephone Encounter (Signed)
noted 

## 2016-04-18 ENCOUNTER — Ambulatory Visit (INDEPENDENT_AMBULATORY_CARE_PROVIDER_SITE_OTHER): Payer: Medicare Other | Admitting: *Deleted

## 2016-04-18 DIAGNOSIS — I428 Other cardiomyopathies: Secondary | ICD-10-CM

## 2016-04-21 NOTE — Progress Notes (Signed)
Remote ICD transmission.   

## 2016-04-23 ENCOUNTER — Encounter: Payer: Self-pay | Admitting: Cardiology

## 2016-04-28 NOTE — Telephone Encounter (Signed)
Form faxed to [facility].  Nothing further needed at this time.

## 2016-05-02 ENCOUNTER — Telehealth: Payer: Self-pay | Admitting: Pulmonary Disease

## 2016-05-02 NOTE — Telephone Encounter (Signed)
Attempted to call Adam [name] back from [facility] on hold for a while.  Will try and call back later.

## 2016-05-06 NOTE — Telephone Encounter (Signed)
Called and spoke with the [facility] and they will get the medications out to the pt.  Nothing further is needed.

## 2016-05-07 LAB — PROTIME-INR: INR: 1.4 — AB (ref <=1.1)

## 2016-05-08 LAB — PROTIME-INR

## 2016-05-09 ENCOUNTER — Ambulatory Visit (INDEPENDENT_AMBULATORY_CARE_PROVIDER_SITE_OTHER): Payer: Medicare Other | Admitting: Pharmacist

## 2016-05-09 DIAGNOSIS — I4891 Unspecified atrial fibrillation: Secondary | ICD-10-CM

## 2016-05-09 DIAGNOSIS — Z7901 Long term (current) use of anticoagulants: Secondary | ICD-10-CM

## 2016-05-13 ENCOUNTER — Telehealth: Payer: Self-pay | Admitting: Pulmonary Disease

## 2016-05-13 ENCOUNTER — Encounter (HOSPITAL_COMMUNITY): Payer: Self-pay | Admitting: Family Medicine

## 2016-05-13 ENCOUNTER — Ambulatory Visit (HOSPITAL_COMMUNITY)
Admission: EM | Admit: 2016-05-13 | Discharge: 2016-05-13 | Disposition: A | Discharge status: Home / Self Care | Payer: Medicare Other | Attending: Family Medicine | Admitting: Family Medicine

## 2016-05-13 DIAGNOSIS — R0982 Postnasal drip: Secondary | ICD-10-CM | POA: Diagnosis not present

## 2016-05-13 DIAGNOSIS — J3489 Other specified disorders of nose and nasal sinuses: Secondary | ICD-10-CM | POA: Diagnosis not present

## 2016-05-13 MED ORDER — AMBRISENTAN 10 MG PO TABS: 10.0000 mg | ORAL_TABLET | Freq: Every day | ORAL | 11 refills | Status: DC

## 2016-05-13 MED ORDER — TADALAFIL (PAH) 20 MG PO TABS: ORAL_TABLET | ORAL | 11 refills | Status: DC

## 2016-05-13 MED ORDER — PHENYLEPHRINE-CHLORPHEN-DM 10-4-12.5 MG/5ML PO LIQD: 5.0000 mL | ORAL | 0 refills | Status: DC | PRN

## 2016-05-13 MED ORDER — AMOXICILLIN 500 MG PO CAPS: 1000.0000 mg | ORAL_CAPSULE | Freq: Two times a day (BID) | ORAL | 0 refills | Status: DC

## 2016-05-13 NOTE — Discharge Instructions (Signed)
Take medication as directed. Drink lots of fluids and follow with your doctor.

## 2016-05-13 NOTE — ED Provider Notes (Signed)
CSN: [id number]     Arrival date & time 05/13/16  26 History   First MD Initiated Contact with Patient 05/13/16 1724     Chief Complaint  Patient presents with  . Cough  . Nasal Congestion   (Consider location/radiation/quality/duration/timing/severity/associated sxs/prior Treatment) 56 year old male complaining of PND, cough and mucus going on the back of his throat for 2 weeks. Also complaining of paranasal sinus pain. Denies fevers or chills. He states that this occurs about once per went her. 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      Past Medical History:  Diagnosis Date  . Atrial fibrillation ([place])   . 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 HTN (hypertension)   . Hypothyroidism (acquired)   . ICD (implantable cardioverter-defibrillator) in place    Medtronic   . Nonischemic cardiomyopathy ([place])   . Pulmonary HTN   . Renal failure    Dialysis  . Thyroid cancer ([place]) 2011   Past Surgical History:  Procedure Laterality Date  . DIALYSIS FISTULA CREATION    . HERNIA REPAIR    . KIDNEY TRANSPLANT    . LAPAROSCOPIC GASTRIC BANDING    . REVISON OF ARTERIOVENOUS FISTULA Left 2/29/7989   Procedure: PLICATION OF ARTERIOVENOUS FISTULA ANEURYSM;  Surgeon: Elam Dutch, MD;  Location: [facility];  Service: Vascular;  Laterality: Left;  . THROMBECTOMY W/ EMBOLECTOMY Left 12/25/2014   Procedure: THROMBECTOMY ARTERIOVENOUS FISTULA;  Surgeon: Elam Dutch, MD;  Location: [facility];  Service: Vascular;  Laterality: Left;  . THYROIDECTOMY    . Toe amputations     Family History  Problem Relation Age of Onset  . Heart failure Mother   . Hypertension Mother   . CAD Mother 44  . Emphysema Mother   . Hypertension Father   . Kidney failure Father    Social History  Substance Use Topics  . Smoking  status: Never Smoker  . Smokeless tobacco: Never Used  . Alcohol use No    Review of Systems  Constitutional: Negative for fever.  HENT: Positive for congestion, postnasal drip and rhinorrhea. Negative for facial swelling, sore throat and trouble swallowing.   Eyes: Negative.   Respiratory: Positive for cough. Negative for shortness of breath.   Gastrointestinal: Negative.   Neurological: Negative.   All other systems reviewed and are negative.   Allergies  Enalaprilat; Ivp dye [iodinated diagnostic agents]; and Vasotec [enalapril]  Home Medications   Prior to Admission medications   Medication Sig Start Date End Date Taking? Authorizing Provider  ambrisentan (LETAIRIS) 10 MG tablet Take 1 tablet (10 mg total) by mouth daily. 05/13/16   Juanito Doom, MD  amiodarone (PACERONE) 200 MG tablet Take 200 mg by mouth daily.    Historical Provider, MD  amoxicillin (AMOXIL) 500 MG capsule Take 2 capsules (1,000 mg total) by mouth 2 (two) times daily. 05/13/16   Janne Napoleon, NP  atorvastatin (LIPITOR) 40 MG tablet Take 1 tablet (40 mg total) by mouth daily. 05/24/15   [name], MD  calcium acetate (PHOSLO) 667 MG capsule Take by mouth. 04/21/15   Historical Provider, MD  cinacalcet (SENSIPAR) 90 MG tablet Take 90 mg by mouth daily.    Historical Provider, MD  levothyroxine (SYNTHROID, LEVOTHROID) 112 MCG tablet Take 112 mcg by mouth 2 (two) times daily.  Historical Provider, MD  midodrine (PROAMATINE) 10 MG tablet Take 1 tablet (10 mg total) by mouth 3 (three) times daily. 09/13/15   Minus Breeding, MD  Phenylephrine-Chlorphen-DM 02-13-11.5 MG/5ML LIQD Take 5 mLs by mouth every 4 (four) hours as needed. 05/13/16   Janne Napoleon, NP  tadalafil, PAH, (ADCIRCA) 20 MG tablet 1 daily on dialysis days, and 2 daily on other days 05/13/16   Juanito Doom, MD  warfarin (COUMADIN) 6 MG tablet TAKE 1 TO 1.5 TABLETS BY MOUTH DAILY AS DIRECTED BY COUMADIN CLINIC 01/07/16   Minus Breeding, MD   Meds  Ordered and Administered this Visit  Medications - No data to display  BP (!) 106/41   Pulse 75   Temp 97.7 F (36.5 C)   Resp 18   SpO2 97%  No data found.   Physical Exam  Constitutional: He appears well-developed and well-nourished. No distress.  HENT:  Head: Normocephalic and atraumatic.  Mouth/Throat: No oropharyngeal exudate.  Bilateral TMs are normal. Oropharynx with minor injection, minor erythema and clear PND.  Eyes: EOM are normal.  Neck: Normal range of motion. Neck supple.  Cardiovascular: Normal rate and regular rhythm.   Murmur heard. Pulmonary/Chest: Effort normal and breath sounds normal. No respiratory distress.  Musculoskeletal: Normal range of motion. He exhibits no edema.  Lymphadenopathy:    He has no cervical adenopathy.  Skin: Skin is warm and dry.  Nursing note and vitals reviewed.   Urgent Care Course   Clinical Course     Procedures (including critical care time)  Labs Review Labs Reviewed - No data to display  Imaging Review No results found.   Visual Acuity Review  Right Eye Distance:   Left Eye Distance:   Bilateral Distance:    Right Eye Near:   Left Eye Near:    Bilateral Near:         MDM   1. PND (post-nasal drip)   2. Sinus pain   Take medication as directed. Drink lots of fluids and follow with your doctor. Meds ordered this encounter  Medications  . Phenylephrine-Chlorphen-DM 02-13-11.5 MG/5ML LIQD    Sig: Take 5 mLs by mouth every 4 (four) hours as needed.    Dispense:  120 mL    Refill:  0    Order Specific Question:   Supervising Provider    Answer:   Melony Overly [id number]  . amoxicillin (AMOXIL) 500 MG capsule    Sig: Take 2 capsules (1,000 mg total) by mouth 2 (two) times daily.    Dispense:  32 capsule    Refill:  0    Order Specific Question:   Supervising Provider    Answer:   Melony Overly [[id number]]        Janne Napoleon, NP 05/13/16 1752

## 2016-05-13 NOTE — Telephone Encounter (Signed)
Refills have been sent to [facility] to have these medications filled for the pt.

## 2016-05-13 NOTE — ED Triage Notes (Signed)
Pt here for cough, nasal congestion and facial pain x 2 weeks.

## 2016-05-14 LAB — CUP PACEART REMOTE DEVICE CHECK
Battery Remaining Longevity: 116 mo
Battery Voltage: 3.01 V
Brady Statistic AS VP Percent: 0.15 %
Brady Statistic RA Percent Paced: 0.72 %
Date Time Interrogation Session: 20171208200034
HIGH POWER IMPEDANCE MEASURED VALUE: 49 Ohm
HIGH POWER IMPEDANCE MEASURED VALUE: 55 Ohm
Implantable Lead Implant Date: 20090109
Implantable Lead Location: 753860
Implantable Lead Model: 6947
Implantable Pulse Generator Implant Date: 20160205
Lead Channel Impedance Value: 304 Ohm
Lead Channel Impedance Value: 893 Ohm
Lead Channel Pacing Threshold Amplitude: 2.25 V
Lead Channel Pacing Threshold Amplitude: 2.5 V
Lead Channel Setting Pacing Amplitude: 4.75 V
Lead Channel Setting Sensing Sensitivity: 0.3 mV
MDC IDC LEAD IMPLANT DT: 20090109
MDC IDC LEAD LOCATION: 753859
MDC IDC MSMT LEADCHNL RA IMPEDANCE VALUE: 570 Ohm
MDC IDC MSMT LEADCHNL RA PACING THRESHOLD PULSEWIDTH: 0.4 ms
MDC IDC MSMT LEADCHNL RA SENSING INTR AMPL: 1.625 mV
MDC IDC MSMT LEADCHNL RA SENSING INTR AMPL: 1.625 mV
MDC IDC MSMT LEADCHNL RV PACING THRESHOLD PULSEWIDTH: 0.4 ms
MDC IDC MSMT LEADCHNL RV SENSING INTR AMPL: 6.25 mV
MDC IDC MSMT LEADCHNL RV SENSING INTR AMPL: 6.25 mV
MDC IDC SET LEADCHNL RA PACING AMPLITUDE: 4.25 V
MDC IDC SET LEADCHNL RV PACING PULSEWIDTH: 0.4 ms
MDC IDC STAT BRADY AP VP PERCENT: 0.03 %
MDC IDC STAT BRADY AP VS PERCENT: 0.69 %
MDC IDC STAT BRADY AS VS PERCENT: 99.13 %
MDC IDC STAT BRADY RV PERCENT PACED: 0.19 %

## 2016-05-16 ENCOUNTER — Ambulatory Visit: Payer: Medicare Other | Admitting: Cardiovascular Disease

## 2016-05-20 ENCOUNTER — Ambulatory Visit: Payer: Medicare Other | Admitting: Family Medicine

## 2016-05-21 ENCOUNTER — Encounter: Payer: Self-pay | Admitting: Family Medicine

## 2016-05-21 ENCOUNTER — Ambulatory Visit (INDEPENDENT_AMBULATORY_CARE_PROVIDER_SITE_OTHER): Payer: Medicare Other | Admitting: Family Medicine

## 2016-05-21 VITALS — BP 92/58 | HR 63 | Ht 73.0 in | Wt 274.6 lb

## 2016-05-21 DIAGNOSIS — Z85850 Personal history of malignant neoplasm of thyroid: Secondary | ICD-10-CM | POA: Diagnosis present

## 2016-05-21 LAB — PROTIME-INR: INR: 1.4 — AB (ref <=1.1)

## 2016-05-21 NOTE — Progress Notes (Signed)
    Subjective:  Adam [name] is a 56 y.o. male who presents to the [facility] today with a chief complaint of history of thyroid cancer.   HPI:  History of Thyroid Cancer Patient diagnosed with thyroid cancer in 2011. Had thyroidectomy. Patient not sure about the specific type of thyroid cancer that he had. Referral was placed to oncology who told patient he needed a referral to ENT. Referral to ENT was placed who told patient he needed referral to endocrinology.   ROS: Per HPI  Objective:  Physical Exam: BP (!) 92/58   Pulse 63   Ht 6\' 1"  (1.854 m)   Wt 274 lb 9.6 oz (124.6 kg)   SpO2 96%   BMI 36.23 kg/m   Gen: NAD, resting comfortably Pulm: NWOB Skin: warm, dry Neuro: grossly normal, moves all extremities Psych: Normal affect and thought content  Assessment/Plan:  History of thyroid cancer Per ENT's note:  "The patient reports a history of thyroid cancer, he underwent total thyroidectomy in 2011 and was followed at [facility] in [place]. He has not seen an endocrinologist here and has had no further workup. He is concerned regarding routine follow-up including ultrasound of the neck. We discussed management of his chronic thyroid issues, this would be handled by endocrinology and might include routine laboratory testing, thyroglobulin studies and possible ultrasound of the neck if there are any concerns regarding mass or other abnormality. This referral could be arranged through his primary care physician Dr. Dimas Chyle at [facility]."  Referral to endocrinology was placed today.   Algis Greenhouse. Jerline Pain, [place] Resident PGY-3 05/21/2016 4:43 PM

## 2016-05-21 NOTE — Assessment & Plan Note (Signed)
Per ENT's note:  "The patient reports a history of thyroid cancer, he underwent total thyroidectomy in 2011 and was followed at [facility] in [place]. He has not seen an endocrinologist here and has had no further workup. He is concerned regarding routine follow-up including ultrasound of the neck. We discussed management of his chronic thyroid issues, this would be handled by endocrinology and might include routine laboratory testing, thyroglobulin studies and possible ultrasound of the neck if there are any concerns regarding mass or other abnormality. This referral could be arranged through his primary care physician Dr. Dimas Chyle at [facility]."  Referral to endocrinology was placed today.

## 2016-05-21 NOTE — Patient Instructions (Signed)
We will refer you to endocrinologist like your ENT recommended.  Come back soon for a regular appointment.  Take care,  Dr Jerline Pain

## 2016-05-22 LAB — PROTIME-INR

## 2016-05-23 ENCOUNTER — Ambulatory Visit (INDEPENDENT_AMBULATORY_CARE_PROVIDER_SITE_OTHER): Payer: Medicare Other | Admitting: Podiatry

## 2016-05-23 ENCOUNTER — Ambulatory Visit (INDEPENDENT_AMBULATORY_CARE_PROVIDER_SITE_OTHER): Payer: Medicare Other | Admitting: Pharmacist

## 2016-05-23 ENCOUNTER — Encounter: Payer: Self-pay | Admitting: Podiatry

## 2016-05-23 DIAGNOSIS — L84 Corns and callosities: Secondary | ICD-10-CM

## 2016-05-23 DIAGNOSIS — I739 Peripheral vascular disease, unspecified: Secondary | ICD-10-CM | POA: Diagnosis not present

## 2016-05-23 DIAGNOSIS — B351 Tinea unguium: Secondary | ICD-10-CM | POA: Diagnosis not present

## 2016-05-23 DIAGNOSIS — E1149 Type 2 diabetes mellitus with other diabetic neurological complication: Secondary | ICD-10-CM | POA: Diagnosis not present

## 2016-05-23 DIAGNOSIS — Z7901 Long term (current) use of anticoagulants: Secondary | ICD-10-CM

## 2016-05-23 DIAGNOSIS — I4891 Unspecified atrial fibrillation: Secondary | ICD-10-CM

## 2016-05-29 NOTE — Progress Notes (Signed)
Subjective: Mr. left presents the office today for diabetic foot evaluation as well as for thick, painful, elongated toenails that he cannot trim himself to get irritated in shoes as well as for a corn on the right fifth toe. She denies any open sores and denies any swelling or redness or warmth to his foot. He states that overall he is doing fine and he has not had any issues. Denies any systemic complaints such as fevers, chills, nausea, vomiting. No acute changes since last appointment, and no other complaints at this time.   Objective: AAO x3, NAD DP/PT pulses palpable bilaterally, CRT less than 3 seconds Protective sensation decreased with Simms Weinstein monofilament Hyperkeratotic lesion present right fifth toe. Upon debridement no underlying ulceration, drainage or any signs of infection. Bilateral fourth and fifth digit toenails are dystrophic, discolored, hypertrophic and there is irritation in shoe gear. Upon debridement there is no drainage or pus. No swelling or redness from the toenail sites. No open lesions or pre-ulcerative lesions.  No pain with calf compression, swelling, warmth, erythema  Assessment: Symptomatic onychomycosis 4, hyperkeratotic lesions 1  Plan: -All treatment options discussed with the patient including all alternatives, risks, complications.  -Nails were sharply debrided 4 without complications or bleeding -Hyperkeratotic lesions debrided 1 without complications or bleeding. -Daily foot inspection. -Follow-up in 3 months or sooner if needed. Call any questions or concerns in the meantime.  Celesta Gentile, DPM

## 2016-06-05 LAB — PROTIME-INR: INR: 2.3 — AB (ref <=1.1)

## 2016-06-10 ENCOUNTER — Ambulatory Visit (INDEPENDENT_AMBULATORY_CARE_PROVIDER_SITE_OTHER): Payer: Medicare Other | Admitting: Endocrinology

## 2016-06-10 ENCOUNTER — Encounter: Payer: Self-pay | Admitting: Endocrinology

## 2016-06-10 DIAGNOSIS — C73 Malignant neoplasm of thyroid gland: Secondary | ICD-10-CM | POA: Diagnosis not present

## 2016-06-10 NOTE — Patient Instructions (Addendum)
Please sign releases of information from hosp in [place] and [place].  Please continue the same thyroid medication.   I'll call you to tell you I have reviewed the records from [place] and [place].  If you have not heard back from me in 10 days, please call us.   Please come back for a follow-up appointment in 6 months.

## 2016-06-10 NOTE — Progress Notes (Signed)
Subjective:    Patient ID: Adam [name], male    DOB: 1960/06/02, 56 y.o.   MRN: [id number]  HPI Pt had thyroidectomy in 2011, for what proved to be thyroid cancer.  He does not know cell type.  Since then, he has had serial labs and ultrasounds.  He says no recurrence has been found.  He has never had RAI, due to hemodialysis.  He has slightly dry skin, worst on the legs, and assoc rhinorrhea. Past Medical History:  Diagnosis Date  . Atrial fibrillation ([place])   . Great toe amputation status ([place])    status post left hallux amputation 08/01/14  . HTN (hypertension)   . Hypothyroidism (acquired)   . ICD (implantable cardioverter-defibrillator) in place    Medtronic   . Nonischemic cardiomyopathy ([place])   . Pulmonary HTN   . Renal failure    Dialysis  . Thyroid cancer ([place]) 2011    Past Surgical History:  Procedure Laterality Date  . DIALYSIS FISTULA CREATION    . HERNIA REPAIR    . KIDNEY TRANSPLANT    . LAPAROSCOPIC GASTRIC BANDING    . REVISON OF ARTERIOVENOUS FISTULA Left 11/09/1599   Procedure: PLICATION OF ARTERIOVENOUS FISTULA ANEURYSM;  Surgeon: Elam Dutch, MD;  Location: [facility];  Service: Vascular;  Laterality: Left;  . THROMBECTOMY W/ EMBOLECTOMY Left 12/25/2014   Procedure: THROMBECTOMY ARTERIOVENOUS FISTULA;  Surgeon: Elam Dutch, MD;  Location: [facility];  Service: Vascular;  Laterality: Left;  . THYROIDECTOMY    . Toe amputations      Social History   Social History  . Marital status: Married    Spouse name: N/A  . Number of children: 1  . Years of education: N/A   Occupational History  . Not on file.   Social History Main Topics  . Smoking status: Never Smoker  . Smokeless tobacco: Never Used  . Alcohol use No  . Drug use: No  . Sexual activity: Not on file   Other Topics Concern  . Not on file   Social History Narrative   Retired Archivist.      Current Outpatient Prescriptions on File Prior to Visit  Medication Sig  Dispense Refill  . ambrisentan (LETAIRIS) 10 MG tablet Take 1 tablet (10 mg total) by mouth daily. 30 tablet 11  . amiodarone (PACERONE) 200 MG tablet Take 200 mg by mouth daily.    . calcium acetate (PHOSLO) 667 MG capsule Take by mouth.    . cinacalcet (SENSIPAR) 90 MG tablet Take 90 mg by mouth daily.    . midodrine (PROAMATINE) 10 MG tablet Take 1 tablet (10 mg total) by mouth 3 (three) times daily. 90 tablet 11  . Phenylephrine-Chlorphen-DM 02-13-11.5 MG/5ML LIQD Take 5 mLs by mouth every 4 (four) hours as needed. 120 mL 0  . tadalafil, PAH, (ADCIRCA) 20 MG tablet 1 daily on dialysis days, and 2 daily on other days 60 tablet 11  . warfarin (COUMADIN) 6 MG tablet TAKE 1 TO 1.5 TABLETS BY MOUTH DAILY AS DIRECTED BY COUMADIN CLINIC 50 tablet 3   No current facility-administered medications on file prior to visit.     Allergies  Allergen Reactions  . Enalaprilat Swelling  . Ivp Dye [Iodinated Diagnostic Agents] Nausea And Vomiting  . Vasotec [Enalapril] Swelling    Family History  Problem Relation Age of Onset  . Heart failure Mother   . Hypertension Mother   . CAD Mother 27  . Emphysema Mother   .  Hypertension Father   . Kidney failure Father     BP 112/86   Pulse 78   Ht 6\' 1"  (1.854 m)   Wt 288 lb (130.6 kg)   SpO2 98%   BMI 38.00 kg/m    Review of Systems denies depression, muscle cramps, sob, weight gain, constipation, numbness, blurry vision, cold intolerance, myalgias, open wound, nasal congestion, easy bruising, and syncope.     Objective:   Physical Exam VS: see vs page GEN: no distress HEAD: head: no deformity eyes: no periorbital swelling, no proptosis.   external nose and ears are normal mouth: no lesion seen NECK: a healed scar is present.  I do not appreciate a nodule in the thyroid or elsewhere in the neck CHEST WALL: no deformity.  Defibrillator is palpable. LUNGS: clear to auscultation CV: normal rate but irreg rhythm, with soft systolic  murmur. ABD: abdomen is soft, nontender.  no hepatosplenomegaly.  not distended.  no hernia MUSCULOSKELETAL: muscle bulk and strength are grossly normal.  no obvious joint swelling.  gait is normal and steady EXTEMITIES: 1+ bilat leg edema PULSES: no carotid bruit NEURO:  cn 2-12 grossly intact.   readily moves all 4's.  sensation is intact to touch on all 4's.  SKIN:  Normal texture and temperature.  No rash or suspicious lesion is visible.   NODES:  None palpable at the neck PSYCH: alert, well-oriented.  Does not appear anxious nor depressed.  Lab Results  Component Value Date   TSH 3.35 02/01/2016      Assessment & Plan:  Thyroid cancer, new to me: sounds like stage 1, but we'll obtain records for review.  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  ESRD: HD precludes RAI rx.   Patient is advised the following: Patient Instructions  Please sign releases of information from hosp in [place] and [place].  Please continue the same thyroid medication.   I'll call you to tell you I have reviewed the records from [place] and [place].  If you have not heard back from me in 10 days, please call us.   Please come back for a follow-up appointment in 6 months.

## 2016-06-11 ENCOUNTER — Other Ambulatory Visit: Payer: Self-pay | Admitting: Family Medicine

## 2016-06-12 DIAGNOSIS — C73 Malignant neoplasm of thyroid gland: Secondary | ICD-10-CM | POA: Insufficient documentation

## 2016-06-17 ENCOUNTER — Telehealth: Payer: Self-pay | Admitting: Pulmonary Disease

## 2016-06-17 ENCOUNTER — Ambulatory Visit: Payer: Medicare Other | Admitting: Cardiovascular Disease

## 2016-06-17 NOTE — Telephone Encounter (Signed)
LMTC x `1 for pt 

## 2016-06-17 NOTE — Telephone Encounter (Signed)
Patient returning call - he can reached at [phone] -pr

## 2016-06-17 NOTE — Telephone Encounter (Signed)
lmtcb x1 for pt. 

## 2016-06-18 ENCOUNTER — Ambulatory Visit (INDEPENDENT_AMBULATORY_CARE_PROVIDER_SITE_OTHER): Payer: Medicare Other | Admitting: Pharmacist

## 2016-06-18 DIAGNOSIS — I4891 Unspecified atrial fibrillation: Secondary | ICD-10-CM

## 2016-06-18 DIAGNOSIS — Z7901 Long term (current) use of anticoagulants: Secondary | ICD-10-CM

## 2016-06-18 NOTE — Telephone Encounter (Signed)
lmtcb x2 for pt. 

## 2016-06-19 ENCOUNTER — Telehealth: Payer: Self-pay | Admitting: Pulmonary Disease

## 2016-06-19 MED ORDER — TADALAFIL (PAH) 20 MG PO TABS: ORAL_TABLET | ORAL | 11 refills | Status: DC

## 2016-06-19 MED ORDER — AMBRISENTAN 10 MG PO TABS: 10.0000 mg | ORAL_TABLET | Freq: Every day | ORAL | 11 refills | Status: DC

## 2016-06-19 NOTE — Telephone Encounter (Signed)
Spoke with Montine Circle at [facility]. They needed clarification on the Adcirca sig. Clarification has been given. Nothing further was needed.

## 2016-06-19 NOTE — Telephone Encounter (Signed)
Spoke with pt. States that he needs his Engineer, site and [employer] sent to [facility] and NOT [facility]. Reports that his insurance is no longer using [facility] for speciality medications. Advised him that we received a call from [facility] stating that the pt was to use [facility]. Pt is adamant that his prescriptions go to [facility]. Rxs have been sent in. Will await PAs on both mediations.

## 2016-06-30 LAB — PROTIME-INR: INR: 2.7 — AB (ref <=1.1)

## 2016-07-01 ENCOUNTER — Ambulatory Visit (INDEPENDENT_AMBULATORY_CARE_PROVIDER_SITE_OTHER): Payer: Medicare Other | Admitting: Cardiovascular Disease

## 2016-07-01 ENCOUNTER — Ambulatory Visit: Payer: Medicare Other | Admitting: Cardiovascular Disease

## 2016-07-01 ENCOUNTER — Ambulatory Visit (INDEPENDENT_AMBULATORY_CARE_PROVIDER_SITE_OTHER): Payer: Medicare Other | Admitting: Pharmacist Clinician (PhC)/ Clinical Pharmacy Specialist

## 2016-07-01 ENCOUNTER — Encounter: Payer: Self-pay | Admitting: Cardiovascular Disease

## 2016-07-01 VITALS — BP 104/60 | HR 72 | Ht 73.0 in | Wt 287.0 lb

## 2016-07-01 DIAGNOSIS — I4891 Unspecified atrial fibrillation: Secondary | ICD-10-CM

## 2016-07-01 DIAGNOSIS — I998 Other disorder of circulatory system: Secondary | ICD-10-CM | POA: Diagnosis not present

## 2016-07-01 DIAGNOSIS — Z7901 Long term (current) use of anticoagulants: Secondary | ICD-10-CM

## 2016-07-01 DIAGNOSIS — I272 Pulmonary hypertension, unspecified: Secondary | ICD-10-CM | POA: Diagnosis not present

## 2016-07-01 DIAGNOSIS — I70229 Atherosclerosis of native arteries of extremities with rest pain, unspecified extremity: Secondary | ICD-10-CM

## 2016-07-01 LAB — PROTIME-INR

## 2016-07-01 NOTE — Patient Instructions (Signed)
Medication Instructions: Your physician recommends that you continue on your current medications as directed. Please refer to the Current Medication list given to you today.   Follow-Up: Your physician recommends that you schedule a follow-up appointment as needed with Dr. Berry.  If you need a refill on your cardiac medications before your next appointment, please call your pharmacy.  

## 2016-07-01 NOTE — Assessment & Plan Note (Signed)
Mr. Wurtzel saw me last year for critical limb ischemia. He has had a right second toe ray amputation with subsequent abscess formation is ultimately improved on clindamycin. He had lower extremity arterial Doppler studies performed 12/12/15 revealed a right ABI 0.79 occluded peroneal. He denies claudication. His wound has completely healed. I will see him back as needed.

## 2016-07-01 NOTE — Progress Notes (Signed)
07/01/2016 Adam [name]   02-Jun-1960  [id number]  Primary Physician Dimas Chyle, MD Primary Cardiologist: Lorretta Harp MD Renae Gloss  HPI:  Adam [name] is a 56 year old severely overweight divorced African-American male father of one child who recently relocated from [place] to [place] in December 2015. He is a retired Designer, industrial/product. I last saw him in the office 01/15/16 . He has a history of nonischemic myopathy, her physical atrial fibrillation on Coumadin anticoagulation. He's had an ICD placed prophylactically which was recently interrogated by Dr. Sallyanne Kuster 07/25/14 and found to be working correctly. He has chronic renal insufficiency status post renal transplant 2001 which failed 18 months later. He has an AV fistula in his left upper extremity. His nephrologist is Dr. Fleet Contras. His other problems include history of hypertension, thyroid cancer and pulmonary hypertension. He has had right hallux and Pediazole years ago and had his left hallux amputated on 07/30/14 which is slowly healing up until 3 weeks ago. He saw Dr. Jacqualyn Posey, his podiatrist, today who demonstrated appropriate healing and his lower extremity arterial Doppler studies performed on 09/14/14 revealed intact circulation on the left side. Since I saw him a year ago he underwent right second toe ray amputation with subsequent abscess formation placed on clindamycin. He's been followed closely in the office by Dr. Jacqualyn Posey. Should be noted that his Dopplers from 09/14/14 revealed only 1 vessel runoff on the right with occluded dorsalis pedis and peroneal artery. Since I saw him 6 months ago his wound has healed. He denies claudication. He's had no cardiovascular episodes. Dr. Sallyanne Kuster follows his ICD which has not discharged since he moved from [place].   Current Outpatient Prescriptions  Medication Sig Dispense Refill  . ambrisentan (LETAIRIS) 10 MG tablet Take 1 tablet (10 mg total) by mouth daily.  30 tablet 11  . amiodarone (PACERONE) 200 MG tablet Take 200 mg by mouth daily.    Marland Kitchen atorvastatin (LIPITOR) 40 MG tablet TAKE 1 TABLET BY MOUTH DAILY 90 tablet 3  . calcium acetate (PHOSLO) 667 MG capsule Take by mouth.    . cinacalcet (SENSIPAR) 90 MG tablet Take 90 mg by mouth daily.    Marland Kitchen levothyroxine (SYNTHROID, LEVOTHROID) 300 MCG tablet     . midodrine (PROAMATINE) 10 MG tablet Take 1 tablet (10 mg total) by mouth 3 (three) times daily. 90 tablet 11  . tadalafil, PAH, (ADCIRCA) 20 MG tablet 1 daily on dialysis days, and 2 daily on other days 60 tablet 11  . warfarin (COUMADIN) 6 MG tablet TAKE 1 TO 1.5 TABLETS BY MOUTH DAILY AS DIRECTED BY COUMADIN CLINIC 50 tablet 3   No current facility-administered medications for this visit.     Allergies  Allergen Reactions  . Enalaprilat Swelling  . Ivp Dye [Iodinated Diagnostic Agents] Nausea And Vomiting  . Vasotec [Enalapril] Swelling    Social History   Social History  . Marital status: Married    Spouse name: N/A  . Number of children: 1  . Years of education: N/A   Occupational History  . Not on file.   Social History Main Topics  . Smoking status: Never Smoker  . Smokeless tobacco: Never Used  . Alcohol use No  . Drug use: No  . Sexual activity: Not on file   Other Topics Concern  . Not on file   Social History Narrative   Retired Archivist.       Review of Systems: General:  negative for chills, fever, night sweats or weight changes.  Cardiovascular: negative for chest pain, dyspnea on exertion, edema, orthopnea, palpitations, paroxysmal nocturnal dyspnea or shortness of breath Dermatological: negative for rash Respiratory: negative for cough or wheezing Urologic: negative for hematuria Abdominal: negative for nausea, vomiting, diarrhea, bright red blood per rectum, melena, or hematemesis Neurologic: negative for visual changes, syncope, or dizziness All other systems reviewed and are otherwise  negative except as noted above.    Blood pressure 104/60, pulse 72, height 6\' 1"  (1.854 m), weight 287 lb (130.2 kg).  General appearance: alert and no distress Neck: no adenopathy, no carotid bruit, no JVD, supple, symmetrical, trachea midline and thyroid not enlarged, symmetric, no tenderness/mass/nodules Lungs: clear to auscultation bilaterally Heart: Soft outflow tract murmur Extremities: extremities normal, atraumatic, no cyanosis or edema  EKG sinus rhythm at 72 with septal Q waves and low limb voltage. I personally reviewed this EKG  ASSESSMENT AND PLAN:   Critical lower limb ischemia Mr. Hakes saw me last year for critical limb ischemia. He has had a right second toe ray amputation with subsequent abscess formation is ultimately improved on clindamycin. He had lower extremity arterial Doppler studies performed 12/12/15 revealed a right ABI 0.79 occluded peroneal. He denies claudication. His wound has completely healed. I will see him back as needed.      Lorretta Harp MD FACP,FACC,FAHA, [facility] 07/01/2016 4:26 PM

## 2016-07-03 ENCOUNTER — Telehealth: Payer: Self-pay | Admitting: Endocrinology

## 2016-07-03 NOTE — Telephone Encounter (Signed)
See message and please advise, Thanks!  

## 2016-07-03 NOTE — Telephone Encounter (Signed)
Pt called in and was wondering if his medical records from [place] that Dr. Loanne Drilling requested at his visit had arrived in our office.

## 2016-07-03 NOTE — Telephone Encounter (Signed)
I have received from [place] only.  Can you call [place], to ask what the progress is?

## 2016-07-04 NOTE — Telephone Encounter (Signed)
I contacted the patient and advised of message. Patient voiced understanding and stated he would call the office in [place] to determine when the records will be sent.

## 2016-07-07 ENCOUNTER — Other Ambulatory Visit: Payer: Self-pay | Admitting: Endocrinology

## 2016-07-07 ENCOUNTER — Telehealth: Payer: Self-pay | Admitting: Endocrinology

## 2016-07-07 DIAGNOSIS — C73 Malignant neoplasm of thyroid gland: Secondary | ICD-10-CM

## 2016-07-07 NOTE — Telephone Encounter (Signed)
I contacted the patient and advised of message. Patient voiced understanding and had no further questions at this time.  

## 2016-07-07 NOTE — Telephone Encounter (Signed)
please call patient: I have reviewed records. Next step is to recheck [place]. you will receive a phone call, about a day and time for an appointment.

## 2016-07-14 LAB — PROTIME-INR: INR: 2.6 — AB (ref <=1.1)

## 2016-07-16 ENCOUNTER — Ambulatory Visit (INDEPENDENT_AMBULATORY_CARE_PROVIDER_SITE_OTHER): Payer: Medicare Other | Admitting: Pharmacist

## 2016-07-16 DIAGNOSIS — I4891 Unspecified atrial fibrillation: Secondary | ICD-10-CM

## 2016-07-16 DIAGNOSIS — Z7901 Long term (current) use of anticoagulants: Secondary | ICD-10-CM

## 2016-07-18 ENCOUNTER — Other Ambulatory Visit: Payer: Medicare Other

## 2016-07-21 ENCOUNTER — Encounter: Payer: Self-pay | Admitting: Endocrinology

## 2016-07-21 ENCOUNTER — Ambulatory Visit (INDEPENDENT_AMBULATORY_CARE_PROVIDER_SITE_OTHER): Payer: Medicare Other | Admitting: *Deleted

## 2016-07-21 ENCOUNTER — Telehealth: Payer: Self-pay | Admitting: Cardiology

## 2016-07-21 DIAGNOSIS — I428 Other cardiomyopathies: Secondary | ICD-10-CM

## 2016-07-21 NOTE — Telephone Encounter (Signed)
Spoke with pt and reminded pt of remote transmission that is due today. Pt verbalized understanding.   

## 2016-07-22 NOTE — Progress Notes (Signed)
Remote ICD transmission.   

## 2016-07-23 ENCOUNTER — Encounter: Payer: Self-pay | Admitting: Cardiology

## 2016-07-23 LAB — CUP PACEART REMOTE DEVICE CHECK
Battery Remaining Longevity: 113 mo
Brady Statistic AP VP Percent: 0.01 %
Brady Statistic AP VS Percent: 0.9 %
Brady Statistic AS VP Percent: 0.1 %
Brady Statistic AS VS Percent: 98.99 %
Brady Statistic RV Percent Paced: 0.12 %
HighPow Impedance: 42 Ohm
HighPow Impedance: 44 Ohm
Implantable Lead Location: 753859
Lead Channel Impedance Value: 532 Ohm
Lead Channel Impedance Value: 893 Ohm
Lead Channel Pacing Threshold Amplitude: 2.125 V
Lead Channel Pacing Threshold Pulse Width: 0.4 ms
Lead Channel Sensing Intrinsic Amplitude: 1.5 mV
Lead Channel Sensing Intrinsic Amplitude: 1.5 mV
Lead Channel Sensing Intrinsic Amplitude: 6.125 mV
Lead Channel Setting Pacing Amplitude: 4.75 V
Lead Channel Setting Pacing Pulse Width: 0.4 ms
MDC IDC LEAD IMPLANT DT: 20090109
MDC IDC LEAD IMPLANT DT: 20090109
MDC IDC LEAD LOCATION: 753860
MDC IDC MSMT BATTERY VOLTAGE: 2.98 V
MDC IDC MSMT LEADCHNL RA PACING THRESHOLD AMPLITUDE: 2.5 V
MDC IDC MSMT LEADCHNL RA PACING THRESHOLD PULSEWIDTH: 0.4 ms
MDC IDC MSMT LEADCHNL RV IMPEDANCE VALUE: 304 Ohm
MDC IDC MSMT LEADCHNL RV SENSING INTR AMPL: 6.125 mV
MDC IDC PG IMPLANT DT: 20160205
MDC IDC SESS DTM: 20180313173830
MDC IDC SET LEADCHNL RA PACING AMPLITUDE: 3.75 V
MDC IDC SET LEADCHNL RV SENSING SENSITIVITY: 0.3 mV
MDC IDC STAT BRADY RA PERCENT PACED: 0.91 %

## 2016-07-24 ENCOUNTER — Ambulatory Visit
Admission: RE | Admit: 2016-07-24 | Discharge: 2016-07-24 | Disposition: A | Discharge status: Home / Self Care | Payer: Medicare Other | Source: Ambulatory Visit | Attending: Endocrinology | Admitting: Endocrinology

## 2016-07-24 DIAGNOSIS — C73 Malignant neoplasm of thyroid gland: Secondary | ICD-10-CM

## 2016-07-26 ENCOUNTER — Encounter: Payer: Self-pay | Admitting: Endocrinology

## 2016-07-28 ENCOUNTER — Other Ambulatory Visit: Payer: Self-pay | Admitting: Endocrinology

## 2016-07-28 DIAGNOSIS — Z85850 Personal history of malignant neoplasm of thyroid: Secondary | ICD-10-CM

## 2016-07-28 DIAGNOSIS — C73 Malignant neoplasm of thyroid gland: Secondary | ICD-10-CM

## 2016-07-29 ENCOUNTER — Telehealth: Payer: Self-pay | Admitting: Cardiology

## 2016-07-29 NOTE — Telephone Encounter (Signed)
Request for surgical clearance:  1. What type of surgery is being performed?Thyroid Biospy   2. When is this surgery scheduled? Pending    3. Are there any medications that need to be held prior to surgery and how long?Coumadin 4 days prior    4. Name of physician performing surgery? Radiologist    5. What is your office phone and fax number? Ph# [phone], Fax# [phone]

## 2016-07-30 NOTE — Progress Notes (Signed)
HPI The patient presents as a new patient. He moved here from [place]. He has end-stage renal disease secondary to uncontrolled hypertension and apparent glomerulonephritis.   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 He is on dialysis.  He was seen at [facility] for part of his transplant evaluation.  Echocardiogram demonstrated well-preserved ejection fraction with some moderate tricuspid regurgitation. He had a right heart catheterization and his pulmonary pressures were actually quite good with his pulmonary vascular resistance being 2.2 [place] units.    Perfusion study was negative for evidence of chronic embolism.   Since I last saw him he has done well.  The patient denies any new symptoms such as chest discomfort, neck or arm discomfort. There has been no new shortness of breath, PND or orthopnea. There have been no reported palpitations, presyncope or syncope.    Allergies  Allergen Reactions  . Enalaprilat Swelling  . Ivp Dye [Iodinated Diagnostic Agents] Nausea And Vomiting  . Vasotec [Enalapril] Swelling    Current Outpatient Prescriptions  Medication Sig Dispense Refill  . ambrisentan (LETAIRIS) 10 MG tablet Take 1 tablet (10 mg total) by mouth daily. 30 tablet 11  . amiodarone (PACERONE) 200 MG tablet Take 200 mg by mouth daily.    Marland Kitchen atorvastatin (LIPITOR) 40 MG tablet TAKE 1 TABLET BY MOUTH DAILY 90 tablet 3  . calcium acetate (PHOSLO) 667 MG capsule Take 1,334 mg by mouth 3 (three) times daily with meals.     . cinacalcet (SENSIPAR) 90 MG tablet Take 90 mg by mouth daily.    Marland Kitchen levothyroxine (SYNTHROID, LEVOTHROID) 300 MCG tablet Take 300 mcg by mouth daily.     . midodrine (PROAMATINE) 10 MG tablet Take 1 tablet (10 mg total) by mouth 3 (three) times daily. 90 tablet 11  . tadalafil, PAH, (ADCIRCA) 20 MG tablet 1 daily on dialysis days, and 2 daily on other days 60 tablet 11  . warfarin (COUMADIN) 6 MG tablet TAKE 1 TO 1.5 TABLETS  BY MOUTH DAILY AS DIRECTED BY COUMADIN CLINIC 50 tablet 3   No current facility-administered medications for this visit.     Past Medical History:  Diagnosis Date  . Atrial fibrillation ([place])   . 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 HTN (hypertension)   . Hypothyroidism (acquired)   . ICD (implantable cardioverter-defibrillator) in place    Medtronic   . Nonischemic cardiomyopathy ([place])   . Pulmonary HTN   . Renal failure    Dialysis  . Thyroid cancer ([place]) 2011    Past Surgical History:  Procedure Laterality Date  . DIALYSIS FISTULA CREATION    . HERNIA REPAIR    . KIDNEY TRANSPLANT    . LAPAROSCOPIC GASTRIC BANDING    . REVISON OF ARTERIOVENOUS FISTULA Left 05/17/2692   Procedure: PLICATION OF ARTERIOVENOUS FISTULA ANEURYSM;  Surgeon: Elam Dutch, MD;  Location: [facility];  Service: Vascular;  Laterality: Left;  . THROMBECTOMY W/ EMBOLECTOMY Left 12/25/2014   Procedure: THROMBECTOMY ARTERIOVENOUS FISTULA;  Surgeon: Elam Dutch, MD;  Location: [facility];  Service: Vascular;  Laterality: Left;  . THYROIDECTOMY    . Toe amputations      ROS:  As stated in the HPI and negative for all other systems.  PHYSICAL EXAM BP (!) 88/56 (BP Location: Right Arm)   Pulse 89   Ht 6\' 1"  (1.854 m)   Wt 282 lb 9.6  oz (128.2 kg)   BMI 37.28 kg/m   GENERAL:  Well appearing NECK:  No jugular venous distention, waveform within normal limits, carotid upstroke brisk and symmetric, no bruits, no thyromegaly LUNGS:  Clear to auscultation bilaterally BACK:  No CVA tenderness CHEST:  ICD pocket left side HEART:  PMI not displaced or sustained,S1 and S2 within normal limits, no S3, no S4, no clicks, no rubs, 2/6 Aholosystolic murmur her in particular it is left upper and right upper sternal borders, no diastolic murmurs ABD:  Flat, positive bowel sounds normal in frequency in pitch, no bruits, no rebound, no guarding, no midline pulsatile mass, no  hepatomegaly, no splenomegaly EXT:  2 plus pulses throughout, no edema, no cyanosis no clubbing dialysis fistula left upper arm, amputation second toe right foot  EKG:  NA  Lab Results  Component Value Date   TSH 3.35 02/01/2016   ALT 10 02/01/2016   AST 18 02/01/2016   ALKPHOS 90 02/01/2016   BILITOT 0.5 02/01/2016   PROT 6.7 02/01/2016   ALBUMIN 3.8 02/01/2016     ASSESSMENT AND PLAN  CARDIOMYOPATHY:   The EF is preserved.   No change in therapy is planned.   He will continue on the meds as listed.     ATRIAL FIB:  He has been maintaining NSR.  I will order TSH and liver enzymes.  He tolerates anticoagulation.  No change in therapy is indicated.    [name] has a CHA2DS2 - VASc score of 2.   Of note he's been given instructions to have his liver profile drawn when he has upcoming blood work.  ICD:  He is up to date with follow up. I reviewed the 07/23/16 report  PULMONARY HTN:  This seems to be well treated on the current meds.  No change in therapy is indicated.     HYPOTENSION:   The blood pressure is low.  However he has no symptoms with this.  He will have no change in therapy but will let me know if he becomes symptomatic.  He uses midodrine before dialysis.

## 2016-07-31 ENCOUNTER — Ambulatory Visit (INDEPENDENT_AMBULATORY_CARE_PROVIDER_SITE_OTHER): Payer: Medicare Other | Admitting: Cardiology

## 2016-07-31 ENCOUNTER — Encounter: Payer: Self-pay | Admitting: Cardiology

## 2016-07-31 VITALS — BP 88/56 | HR 89 | Ht 73.0 in | Wt 282.6 lb

## 2016-07-31 DIAGNOSIS — I428 Other cardiomyopathies: Secondary | ICD-10-CM | POA: Diagnosis not present

## 2016-07-31 DIAGNOSIS — I959 Hypotension, unspecified: Secondary | ICD-10-CM | POA: Diagnosis not present

## 2016-07-31 DIAGNOSIS — I4891 Unspecified atrial fibrillation: Secondary | ICD-10-CM | POA: Diagnosis not present

## 2016-07-31 NOTE — Telephone Encounter (Signed)
[name]/[name] can you please advised

## 2016-07-31 NOTE — Telephone Encounter (Signed)
Nya - I put a clearance letter in the computer, could you please send it to the right people.  Thx

## 2016-07-31 NOTE — Patient Instructions (Signed)
Medication Instructions:  Your physician recommends that you continue on your current medications as directed. Please refer to the Current Medication list given to you today.  Follow-Up: Your physician wants you to follow-up in: 6 MONTHS with Dr. Hochrein. You will receive a reminder letter in the mail two months in advance. If you don't receive a letter, please call our office to schedule the follow-up appointment.   Any Other Special Instructions Will Be Listed Below (If Applicable).     If you need a refill on your cardiac medications before your next appointment, please call your pharmacy.   

## 2016-07-31 NOTE — Telephone Encounter (Signed)
Clearance letter faxed to radiology

## 2016-08-01 ENCOUNTER — Encounter: Payer: Self-pay | Admitting: Cardiology

## 2016-08-04 ENCOUNTER — Telehealth: Payer: Self-pay | Admitting: Pulmonary Disease

## 2016-08-04 MED ORDER — GUAIFENESIN-CODEINE 100-10 MG/5ML PO SOLN: 5.0000 mL | Freq: Four times a day (QID) | ORAL | 0 refills | Status: DC | PRN

## 2016-08-04 NOTE — Telephone Encounter (Signed)
Adam [name] contacted me to let me know that he has been experiencing cough, sinus congestion, fever, some chills.  No sputum production.  He says he has amoxicillin at home He has an appointment to see me to morrow  Dyspnea only with cough, not at rest  Plan Will call in cough syrup He will take amoxicillin Will get CXR with clinic visit tomorrow Will see him tomorrow in clinic Advised to go to ER if dyspnea worsens

## 2016-08-05 ENCOUNTER — Ambulatory Visit: Payer: Medicare Other

## 2016-08-05 ENCOUNTER — Encounter: Payer: Self-pay | Admitting: Pulmonary Disease

## 2016-08-05 ENCOUNTER — Inpatient Hospital Stay (HOSPITAL_COMMUNITY): Payer: Medicare Other

## 2016-08-05 ENCOUNTER — Inpatient Hospital Stay (HOSPITAL_COMMUNITY)
Admission: AD | Admit: 2016-08-05 | Discharge: 2016-08-09 | DRG: 193 | Disposition: A | Discharge status: Home / Self Care | Payer: Medicare Other | Source: Ambulatory Visit | Attending: Pulmonary Disease | Admitting: Pulmonary Disease

## 2016-08-05 ENCOUNTER — Encounter (HOSPITAL_COMMUNITY): Payer: Self-pay | Admitting: General Practice

## 2016-08-05 ENCOUNTER — Ambulatory Visit (INDEPENDENT_AMBULATORY_CARE_PROVIDER_SITE_OTHER): Payer: Medicare Other | Admitting: Pulmonary Disease

## 2016-08-05 VITALS — BP 114/60 | HR 107 | Temp 97.4°F | Ht 73.0 in | Wt 278.0 lb

## 2016-08-05 DIAGNOSIS — Y95 Nosocomial condition: Secondary | ICD-10-CM | POA: Diagnosis present

## 2016-08-05 DIAGNOSIS — Z79899 Other long term (current) drug therapy: Secondary | ICD-10-CM

## 2016-08-05 DIAGNOSIS — J189 Pneumonia, unspecified organism: Principal | ICD-10-CM | POA: Diagnosis present

## 2016-08-05 DIAGNOSIS — Z95810 Presence of automatic (implantable) cardiac defibrillator: Secondary | ICD-10-CM

## 2016-08-05 DIAGNOSIS — D631 Anemia in chronic kidney disease: Secondary | ICD-10-CM | POA: Diagnosis present

## 2016-08-05 DIAGNOSIS — E669 Obesity, unspecified: Secondary | ICD-10-CM | POA: Diagnosis present

## 2016-08-05 DIAGNOSIS — Z85850 Personal history of malignant neoplasm of thyroid: Secondary | ICD-10-CM

## 2016-08-05 DIAGNOSIS — Z89421 Acquired absence of other right toe(s): Secondary | ICD-10-CM

## 2016-08-05 DIAGNOSIS — I120 Hypertensive chronic kidney disease with stage 5 chronic kidney disease or end stage renal disease: Secondary | ICD-10-CM | POA: Diagnosis present

## 2016-08-05 DIAGNOSIS — I272 Pulmonary hypertension, unspecified: Secondary | ICD-10-CM | POA: Diagnosis not present

## 2016-08-05 DIAGNOSIS — Z7901 Long term (current) use of anticoagulants: Secondary | ICD-10-CM

## 2016-08-05 DIAGNOSIS — Z6836 Body mass index (BMI) 36.0-36.9, adult: Secondary | ICD-10-CM | POA: Diagnosis not present

## 2016-08-05 DIAGNOSIS — Z89412 Acquired absence of left great toe: Secondary | ICD-10-CM | POA: Diagnosis not present

## 2016-08-05 DIAGNOSIS — I428 Other cardiomyopathies: Secondary | ICD-10-CM | POA: Diagnosis not present

## 2016-08-05 DIAGNOSIS — N186 End stage renal disease: Secondary | ICD-10-CM | POA: Diagnosis not present

## 2016-08-05 DIAGNOSIS — N2581 Secondary hyperparathyroidism of renal origin: Secondary | ICD-10-CM | POA: Diagnosis present

## 2016-08-05 DIAGNOSIS — J9601 Acute respiratory failure with hypoxia: Secondary | ICD-10-CM | POA: Diagnosis not present

## 2016-08-05 DIAGNOSIS — M898X9 Other specified disorders of bone, unspecified site: Secondary | ICD-10-CM | POA: Diagnosis present

## 2016-08-05 DIAGNOSIS — Z992 Dependence on renal dialysis: Secondary | ICD-10-CM

## 2016-08-05 DIAGNOSIS — Z89411 Acquired absence of right great toe: Secondary | ICD-10-CM

## 2016-08-05 DIAGNOSIS — R0600 Dyspnea, unspecified: Secondary | ICD-10-CM | POA: Diagnosis present

## 2016-08-05 DIAGNOSIS — I4891 Unspecified atrial fibrillation: Secondary | ICD-10-CM | POA: Diagnosis present

## 2016-08-05 DIAGNOSIS — Z8249 Family history of ischemic heart disease and other diseases of the circulatory system: Secondary | ICD-10-CM

## 2016-08-05 DIAGNOSIS — I959 Hypotension, unspecified: Secondary | ICD-10-CM | POA: Diagnosis present

## 2016-08-05 DIAGNOSIS — Z89422 Acquired absence of other left toe(s): Secondary | ICD-10-CM

## 2016-08-05 HISTORY — DX: Pneumonia, unspecified organism: J18.9

## 2016-08-05 HISTORY — DX: Unspecified osteoarthritis, unspecified site: M19.90

## 2016-08-05 HISTORY — DX: End stage renal disease: N18.6

## 2016-08-05 HISTORY — DX: Personal history of other medical treatment: Z92.89

## 2016-08-05 HISTORY — DX: Presence of automatic (implantable) cardiac defibrillator: Z95.810

## 2016-08-05 HISTORY — DX: Dependence on renal dialysis: Z99.2

## 2016-08-05 LAB — CBC WITH DIFFERENTIAL/PLATELET
BASOS ABS: 0 10*3/uL (ref 0.0–0.1)
Basophils Relative: 0 %
Eosinophils Absolute: 0 10*3/uL (ref 0.0–0.7)
Eosinophils Relative: 0 %
HEMATOCRIT: 34.1 % — AB (ref 39.0–52.0)
Hemoglobin: 10.6 g/dL — ABNORMAL LOW (ref 13.0–17.0)
LYMPHS PCT: 7 %
Lymphs Abs: 0.5 10*3/uL — ABNORMAL LOW (ref 0.7–4.0)
MCH: 29.4 pg (ref 26.0–34.0)
MCHC: 31.1 g/dL (ref 30.0–36.0)
MCV: 94.5 fL (ref 78.0–100.0)
MONO ABS: 0.3 10*3/uL (ref 0.1–1.0)
Monocytes Relative: 5 %
NEUTROS ABS: 6.4 10*3/uL (ref 1.7–7.7)
Neutrophils Relative %: 88 %
Platelets: 129 10*3/uL — ABNORMAL LOW (ref 150–400)
RBC: 3.61 MIL/uL — AB (ref 4.22–5.81)
RDW: 17.5 % — ABNORMAL HIGH (ref 11.5–15.5)
WBC: 7.3 10*3/uL (ref 4.0–10.5)

## 2016-08-05 LAB — COMPREHENSIVE METABOLIC PANEL
ALBUMIN: 3.3 g/dL — AB (ref 3.5–5.0)
ALT: 20 U/L (ref 17–63)
ANION GAP: 20 — AB (ref 5–15)
AST: 59 U/L — ABNORMAL HIGH (ref 15–41)
Alkaline Phosphatase: 97 U/L (ref 38–126)
BILIRUBIN TOTAL: 0.7 mg/dL (ref 0.3–1.2)
BUN: 19 mg/dL (ref 6–20)
CO2: 24 mmol/L (ref 22–32)
Calcium: 8.3 mg/dL — ABNORMAL LOW (ref 8.9–10.3)
Chloride: 91 mmol/L — ABNORMAL LOW (ref 101–111)
Creatinine, Ser: 5.91 mg/dL — ABNORMAL HIGH (ref 0.61–1.24)
GFR calc Af Amer: 11 mL/min — ABNORMAL LOW (ref >60)
GFR calc non Af Amer: 10 mL/min — ABNORMAL LOW (ref >60)
GLUCOSE: 101 mg/dL — AB (ref 65–99)
POTASSIUM: 3.7 mmol/L (ref 3.5–5.1)
Sodium: 135 mmol/L (ref 135–145)
TOTAL PROTEIN: 7.1 g/dL (ref 6.5–8.1)

## 2016-08-05 LAB — PROTIME-INR
INR: 2.92
Prothrombin Time: 31.1 seconds — ABNORMAL HIGH (ref 11.4–15.2)

## 2016-08-05 LAB — TROPONIN I: TROPONIN I: 0.15 ng/mL — AB (ref <0.03)

## 2016-08-05 MED ORDER — DEXTROSE 5 % IV SOLN
1.0000 g | INTRAVENOUS | Status: DC
  Administered 2016-08-06: 1 g via INTRAVENOUS
  Filled 2016-08-05 (×2): qty 1

## 2016-08-05 MED ORDER — ONDANSETRON HCL 4 MG PO TABS: 4.0000 mg | ORAL_TABLET | Freq: Four times a day (QID) | ORAL | Status: DC | PRN

## 2016-08-05 MED ORDER — TADALAFIL (PAH) 20 MG PO TABS
20.0000 mg | ORAL_TABLET | Freq: Every day | ORAL | Status: DC
  Administered 2016-08-06 – 2016-08-09 (×4): 20 mg via ORAL
  Filled 2016-08-05 (×4): qty 1

## 2016-08-05 MED ORDER — ALBUTEROL SULFATE (2.5 MG/3ML) 0.083% IN NEBU
2.5000 mg | INHALATION_SOLUTION | RESPIRATORY_TRACT | Status: DC | PRN
  Administered 2016-08-07 (×3): 2.5 mg via RESPIRATORY_TRACT
  Filled 2016-08-05 (×3): qty 3

## 2016-08-05 MED ORDER — CINACALCET HCL 30 MG PO TABS
90.0000 mg | ORAL_TABLET | Freq: Every day | ORAL | Status: DC
  Administered 2016-08-06 – 2016-08-07 (×2): 90 mg via ORAL
  Filled 2016-08-05 (×3): qty 3

## 2016-08-05 MED ORDER — SODIUM CHLORIDE 0.9% FLUSH
3.0000 mL | Freq: Two times a day (BID) | INTRAVENOUS | Status: DC
  Administered 2016-08-05 – 2016-08-09 (×7): 3 mL via INTRAVENOUS

## 2016-08-05 MED ORDER — LEVOTHYROXINE SODIUM 100 MCG PO TABS
300.0000 ug | ORAL_TABLET | Freq: Every day | ORAL | Status: DC
  Administered 2016-08-06 – 2016-08-09 (×4): 300 ug via ORAL
  Filled 2016-08-05 (×4): qty 3

## 2016-08-05 MED ORDER — SENNOSIDES-DOCUSATE SODIUM 8.6-50 MG PO TABS: 1.0000 | ORAL_TABLET | Freq: Every evening | ORAL | Status: DC | PRN

## 2016-08-05 MED ORDER — ATORVASTATIN CALCIUM 40 MG PO TABS
40.0000 mg | ORAL_TABLET | Freq: Every day | ORAL | Status: DC
  Administered 2016-08-06 – 2016-08-09 (×4): 40 mg via ORAL
  Filled 2016-08-05 (×5): qty 1

## 2016-08-05 MED ORDER — AMBRISENTAN 5 MG PO TABS: 10.0000 mg | ORAL_TABLET | Freq: Every day | ORAL | Status: DC

## 2016-08-05 MED ORDER — VANCOMYCIN HCL 10 G IV SOLR
2000.0000 mg | Freq: Once | INTRAVENOUS | Status: DC
  Filled 2016-08-05: qty 2000

## 2016-08-05 MED ORDER — SODIUM CHLORIDE 0.9 % IV SOLN: 250.0000 mL | INTRAVENOUS | Status: DC | PRN

## 2016-08-05 MED ORDER — WARFARIN SODIUM 6 MG PO TABS
9.0000 mg | ORAL_TABLET | Freq: Once | ORAL | Status: AC
  Administered 2016-08-06: 9 mg via ORAL
  Filled 2016-08-05: qty 1

## 2016-08-05 MED ORDER — VANCOMYCIN HCL 10 G IV SOLR
2000.0000 mg | Freq: Once | INTRAVENOUS | Status: AC
  Administered 2016-08-05: 2000 mg via INTRAVENOUS
  Filled 2016-08-05: qty 2000

## 2016-08-05 MED ORDER — SODIUM CHLORIDE 0.9% FLUSH: 3.0000 mL | INTRAVENOUS | Status: DC | PRN

## 2016-08-05 MED ORDER — ACETAMINOPHEN 650 MG RE SUPP: 650.0000 mg | Freq: Four times a day (QID) | RECTAL | Status: DC | PRN

## 2016-08-05 MED ORDER — CALCIUM ACETATE (PHOS BINDER) 667 MG PO CAPS
1334.0000 mg | ORAL_CAPSULE | Freq: Three times a day (TID) | ORAL | Status: DC
  Administered 2016-08-05 – 2016-08-07 (×6): 1334 mg via ORAL
  Filled 2016-08-05 (×6): qty 2

## 2016-08-05 MED ORDER — ACETAMINOPHEN 325 MG PO TABS: 650.0000 mg | ORAL_TABLET | Freq: Four times a day (QID) | ORAL | Status: DC | PRN

## 2016-08-05 MED ORDER — WARFARIN - PHARMACIST DOSING INPATIENT: Freq: Every day | Status: DC

## 2016-08-05 MED ORDER — AMIODARONE HCL 200 MG PO TABS
200.0000 mg | ORAL_TABLET | Freq: Every day | ORAL | Status: DC
  Administered 2016-08-06 – 2016-08-09 (×4): 200 mg via ORAL
  Filled 2016-08-05 (×4): qty 1

## 2016-08-05 MED ORDER — MIDODRINE HCL 5 MG PO TABS
10.0000 mg | ORAL_TABLET | Freq: Three times a day (TID) | ORAL | Status: DC
  Administered 2016-08-05 – 2016-08-09 (×12): 10 mg via ORAL
  Filled 2016-08-05 (×11): qty 2

## 2016-08-05 MED ORDER — GUAIFENESIN ER 600 MG PO TB12
600.0000 mg | ORAL_TABLET | Freq: Two times a day (BID) | ORAL | Status: DC
  Administered 2016-08-05 – 2016-08-09 (×8): 600 mg via ORAL
  Filled 2016-08-05 (×8): qty 1

## 2016-08-05 MED ORDER — CEFEPIME HCL 2 G IJ SOLR
2.0000 g | Freq: Once | INTRAMUSCULAR | Status: AC
  Administered 2016-08-05: 2 g via INTRAVENOUS
  Filled 2016-08-05 (×2): qty 2

## 2016-08-05 MED ORDER — CEFEPIME HCL 2 G IJ SOLR
2.0000 g | Freq: Once | INTRAMUSCULAR | Status: DC
  Filled 2016-08-05: qty 2

## 2016-08-05 MED ORDER — ONDANSETRON HCL 4 MG/2ML IJ SOLN: 4.0000 mg | Freq: Four times a day (QID) | INTRAMUSCULAR | Status: DC | PRN

## 2016-08-05 NOTE — Progress Notes (Signed)
ANTICOAGULATION CONSULT NOTE - Initial Consult  Pharmacy Consult for warfarin Indication: atrial fibrillation  Allergies  Allergen Reactions  . Enalaprilat Swelling  . Ivp Dye [Iodinated Diagnostic Agents] Nausea And Vomiting  . Vasotec [Enalapril] Swelling    Patient Measurements: Height: 6\' 1"  (185.4 cm) Weight: 278 lb (126.1 kg) IBW/kg (Calculated) : 79.9  Vital Signs: Temp: 97.7 F (36.5 C) (03/27 1609) Temp Source: Oral (03/27 1609) BP: 100/58 (03/27 1609) Pulse Rate: 98 (03/27 1609)  Labs:  Recent Labs  08/05/16 1700  HGB 10.6*  HCT 34.1*  PLT 129*  LABPROT 31.1*  INR 2.92    CrCl cannot be calculated (Patient's most recent lab result is older than the maximum 21 days allowed.).   Medical History: Past Medical History:  Diagnosis Date  . Atrial fibrillation ([place])   . 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 HTN (hypertension)   . Hypothyroidism (acquired)   . ICD (implantable cardioverter-defibrillator) in place    Medtronic   . Nonischemic cardiomyopathy ([place])   . Pulmonary HTN   . Renal failure    Dialysis  . Thyroid cancer ([place]) 2011    Assessment: 56 yo male on warfarin prior to arrival for atrial fibrillation. INR on admission 2.92 with last dose taken on 3/26 pm. CBC stable.  Prior to arrival dose of warfarin was 6 mg on Sundays and 9 mg all other days per clinic notes  Goal of Therapy:  INR 2-3 Monitor platelets by anticoagulation protocol: Yes   Plan:  1. Warfarin 9 mg x 1 this evening 2. Daily INR  Vincenza Hews, PharmD, BCPS 08/05/2016, 5:49 PM

## 2016-08-05 NOTE — Progress Notes (Signed)
Subjective:    Patient ID: Adam [name], male    DOB: 1960/05/31, 56 y.o.   MRN: [id number]  Synopsis: Diagnosed with pulmonary hypertension in 2012. 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 Unfortunately his kidney transplant failed for uncertain reasons by [id number]. Was diagnosed with pulmonary hypertension by 2012 in [place]. Was started on [place] in [place] and moved to [place] not long afterwards. Developed atrial fibrillation around 2009, had a defibrillator placed.  08/2015 R[facility]: Cardiac Index (l/min/m2) 3.7 L/min/m2  Right Atrium Mean Pressure (mmHg) 5 mmHg   Right Ventricle Systolic Pressure (mmHg) 46 mmHg   Pulmonary Artery Mean Pressure (mmHg) 31 mmHg   Pulmonary Wedge Pressure (mmHg) 11 mmHg   Pulmonary Vascular Resistance (Wood units) 2.2 Wood units   He had a second evaluation for renal transplant at [facility] in 2017 but was denied because of his hypertension and atrial fibrillation.  His dry weight is 126kg, his BP is best there.    HPI Chief Complaint  Patient presents with  . Follow-up    c/o worsening chest ocngestion, sinus congestion, PND, fatigue, sob X1 week.  possible pna?    Adam [name] says that he has severe dyspnea ever since last week.  Since Friday (when symptoms began), he has had a cough with no mucus.  He feels very weak, not eating.  He has had chills, and he noted a fever as well.  He says that his temperature normally runs 94-95 but lately his has been 98-99.  He has been taking juice to some degree.  He coughs when he tries to swallow.  He says that food doesn't get stuck, but the dyspnea is severe when he coughs.    No leg swelling, no chest pain, no cough.  He has been taking amoxicillin since Friday, no help.  His symptoms have been worsening in the last few days.     Past Medical History:  Diagnosis Date  . Atrial fibrillation ([place])   . Great toe amputation status ([place])    status post  left hallux amputation 08/01/14  . HTN (hypertension)   . Hypothyroidism (acquired)   . ICD (implantable cardioverter-defibrillator) in place    Medtronic   . Nonischemic cardiomyopathy ([place])   . Pulmonary HTN   . Renal failure    Dialysis  . Thyroid cancer ([place]) 2011      Review of Systems  Constitutional: Negative for chills, fatigue and fever.  HENT: Negative for postnasal drip, rhinorrhea and sinus pressure.   Respiratory: Negative for cough, shortness of breath and wheezing.   Cardiovascular: Negative for chest pain, palpitations and leg swelling.       Objective:   Physical Exam Vitals:   08/05/16 1448  BP: 114/60  Pulse: (!) 107  Temp: 97.4 F (36.3 C)  TempSrc: Oral  SpO2: (!) 89%  Weight: 278 lb (126.1 kg)  Height: 6\' 1"  (1.854 m)   RA  Gen: acutely ill appearing, no acute distress HENT: NCAT, OP clear, neck supple without masses Eyes: PERRL, EOMi Lymph: no cervical lymphadenopathy PULM: Crackles and wheezing in bases, normal effort CV: RRR, no mgr, no JVD GI: BS+, soft, nontender, no hsm Derm: no rash or skin breakdown MSK: normal bulk and tone Neuro: A&Ox4, CN II-XII intact, strength 5/5 in all 4 extremities Psyche: normal mood and affect   6MW March 2017 6 minute walk test at [facility] 385 m, O2 saturation 100% on  room air  Echo: March 2017 echocardiogram [facility] RVSP 40 mmHg, LVEF 55%  RHC April 2017 right heart catheterization [facility] cardiac index 3.7, right atrial pressure 5, RVSP 46, mean PA pressure 31, pulmonary capillary wedge pressure 11, pulmonary vascular resistance 2.2  Records from his visit with cardiology in March 2018 reviewed were he was seen by Dr. Orlean Patten 9 for A. fib, cardiomyopathy, pulmonary hypertension. No changes in his medicines were made at that time.    Assessment & Plan:   Impression: Acute respiratory failure with hypoxemia Healthcare associated pneumonia Pulmonary hypertension End-stage  renal disease  Discussion: Adam [name] has failed outpatient therapy for an acute respiratory illness associated with acute respiratory failure with hypoxemia. The differential diagnosis includes healthcare associated pneumonia which I think is most likely. Given his clinical history pulmonary edema is also possible though his physical exam today is not consistent with volume overload and he is at his dry weight of 126 kg. It's not clear to me that his pulmonary hypertension is any worse today.  I doubt that this is related to amiodarone given his sudden onset and subjective fevers.  Plan: Healthcare associated pneumonia: -Vancomycin, cefepime -Blood cultures -Chest x-ray - if no improvement consider CT chest/bronchoscopy  Acute respiratory failure with hypoxemia: -Supplemental oxygen to maintain O2 saturation greater than 88%  End-stage renal disease: -Consult nephrology  Pulmonary hypertension: -Continue Letairis and Adcirca  Afib: -continue amiodarone  -continue warfarin  Feeding: renal diet Analgesia: n/a Sedation: n/a Thromboprophylaxis: warfarin HOB >30 degrees Ulcer prophylaxis: n/a Glucose control: monitor  Roselie Awkward, MD [facility] Pager: [phone] Cell: [phone] After 3pm or if no response, call [phone]      Current Outpatient Prescriptions:  .  ambrisentan (LETAIRIS) 10 MG tablet, Take 1 tablet (10 mg total) by mouth daily., Disp: 30 tablet, Rfl: 11 .  amiodarone (PACERONE) 200 MG tablet, Take 200 mg by mouth daily., Disp: , Rfl:  .  amoxicillin-clavulanate (AUGMENTIN) 500-125 MG tablet, Take 1 tablet by mouth 2 (two) times daily., Disp: , Rfl:  .  atorvastatin (LIPITOR) 40 MG tablet, TAKE 1 TABLET BY MOUTH DAILY, Disp: 90 tablet, Rfl: 3 .  calcium acetate (PHOSLO) 667 MG capsule, Take 1,334 mg by mouth 3 (three) times daily with meals. , Disp: , Rfl:  .  cinacalcet (SENSIPAR) 90 MG tablet, Take 90 mg by mouth daily., Disp: , Rfl:  .   guaiFENesin-codeine 100-10 MG/5ML syrup, Take 5 mLs by mouth every 6 (six) hours as needed for cough., Disp: 120 mL, Rfl: 0 .  levothyroxine (SYNTHROID, LEVOTHROID) 300 MCG tablet, Take 300 mcg by mouth daily. , Disp: , Rfl:  .  midodrine (PROAMATINE) 10 MG tablet, Take 1 tablet (10 mg total) by mouth 3 (three) times daily., Disp: 90 tablet, Rfl: 11 .  tadalafil, PAH, (ADCIRCA) 20 MG tablet, 1 daily on dialysis days, and 2 daily on other days, Disp: 60 tablet, Rfl: 11 .  warfarin (COUMADIN) 6 MG tablet, TAKE 1 TO 1.5 TABLETS BY MOUTH DAILY AS DIRECTED BY COUMADIN CLINIC, Disp: 50 tablet, Rfl: 3

## 2016-08-05 NOTE — Progress Notes (Signed)
Adam [name] is a 56 y.o. male patient admitted as a direct admit from home awake, alert - oriented  X 4 - no acute distress noted.  VSS - Blood pressure (!) 100/58, pulse 98, temperature 97.7 F (36.5 C), temperature source Oral, resp. rate 16, height 6\' 1"  (1.854 m), weight 126.1 kg (278 lb), SpO2 93 %.    IV in place, occlusive dsg intact without redness.  Orientation to room, and floor completed with information packet given to patient/family.  Patient declined safety video at this time.  Admission INP armband ID verified with patient/family, and in place.   SR up x 2, fall assessment complete, with patient and family able to verbalize understanding of risk associated with falls, and verbalized understanding to call nsg before up out of bed.  Call light within reach, patient able to voice, and demonstrate understanding.  Skin, clean-dry- intact without evidence of bruising, or skin tears.   No evidence of skin break down noted on exam. Pt has right big toe, 2nd and 3rd toe amputated, his left big toe and 2nd toe are also amputated. Legs are extremely dry. Cream applied to keep them moisturized.   Will cont to eval and treat per MD orders.  Dorris Carnes, RN 08/05/2016 5:05 PM

## 2016-08-05 NOTE — Progress Notes (Signed)
Pt arrived at the unit via wheelchair at 1605

## 2016-08-05 NOTE — H&P (Signed)
Synopsis: Diagnosed with pulmonary hypertension in 2012. 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 Unfortunately his kidney transplant failed for uncertain reasons by [id number]. Was diagnosed with pulmonary hypertension by 2012 in [place]. Was started on [place] in [place] and moved to [place] not long afterwards. Developed atrial fibrillation around 2009, had a defibrillator placed.  08/2015 R[facility]: Cardiac Index (l/min/m2) 3.7 L/min/m2  Right Atrium Mean Pressure (mmHg) 5 mmHg   Right Ventricle Systolic Pressure (mmHg) 46 mmHg   Pulmonary Artery Mean Pressure (mmHg) 31 mmHg   Pulmonary Wedge Pressure (mmHg) 11 mmHg   Pulmonary Vascular Resistance (Wood units) 2.2 Wood units   He had a second evaluation for renal transplant at [facility] in 2017 but was denied because of his hypertension and atrial fibrillation.  His dry weight is 126kg, his BP is best there.    HPI     Chief Complaint  Patient presents with  . Follow-up    c/o worsening chest ocngestion, sinus congestion, PND, fatigue, sob X1 week.  possible pna?    Giavonni says that he has severe dyspnea ever since last week.  Since Friday (when symptoms began), he has had a cough with no mucus.  He feels very weak, not eating.  He has had chills, and he noted a fever as well.  He says that his temperature normally runs 94-95 but lately his has been 98-99.  He has been taking juice to some degree.  He coughs when he tries to swallow.  He says that food doesn't get stuck, but the dyspnea is severe when he coughs.    No leg swelling, no chest pain, no cough.  He has been taking amoxicillin since Friday, no help.  His symptoms have been worsening in the last few days.    Past Medical History:  Diagnosis Date  . Atrial fibrillation ([place])   . Great toe amputation status ([place])    status post left hallux amputation 08/01/14  . HTN (hypertension)   . Hypothyroidism  (acquired)   . ICD (implantable cardioverter-defibrillator) in place    [employer]   . Nonischemic cardiomyopathy ([place])   . Pulmonary HTN   . Renal failure    Dialysis  . Thyroid cancer ([place]) 2011     Family History  Problem Relation Age of Onset  . Heart failure Mother   . Hypertension Mother   . CAD Mother 89  . Emphysema Mother   . Hypertension Father   . Kidney failure Father      Social History   Social History  . Marital status: Married    Spouse name: N/A  . Number of children: 1  . Years of education: N/A   Occupational History  . Not on file.   Social History Main Topics  . Smoking status: Never Smoker  . Smokeless tobacco: Never Used  . Alcohol use No  . Drug use: No  . Sexual activity: Not on file   Other Topics Concern  . Not on file   Social History Narrative   Retired Archivist.       Allergies  Allergen Reactions  . Enalaprilat Swelling  . Ivp Dye [Iodinated Diagnostic Agents] Nausea And Vomiting  . Vasotec [Enalapril] Swelling     @encmedstart @   Review of Systems  Constitutional: Negative for chills, fatigue and fever.  HENT: Negative for postnasal drip, rhinorrhea and sinus pressure.   Respiratory: Negative for cough, shortness  of breath and wheezing.   Cardiovascular: Negative for chest pain, palpitations and leg swelling.       Objective:   Physical Exam    Vitals:   08/05/16 1448  BP: 114/60  Pulse: (!) 107  Temp: 97.4 F (36.3 C)  TempSrc: Oral  SpO2: (!) 89%  Weight: 278 lb (126.1 kg)  Height: 6\' 1"  (1.854 m)   RA  Gen: acutely ill appearing, no acute distress HENT: NCAT, OP clear, neck supple without masses Eyes: PERRL, EOMi Lymph: no cervical lymphadenopathy PULM: Crackles and wheezing in bases, normal effort CV: RRR, no mgr, no JVD GI: BS+, soft, nontender, no hsm Derm: no rash or skin breakdown MSK: normal bulk and tone Neuro: A&Ox4, CN II-XII intact, strength 5/5 in all 4  extremities Psyche: normal mood and affect   6MW March 2017 6 minute walk test at [facility] 385 m, O2 saturation 100% on room air  Echo: March 2017 echocardiogram [facility] RVSP 40 mmHg, LVEF 55%  RHC April 2017 right heart catheterization [facility] cardiac index 3.7, right atrial pressure 5, RVSP 46, mean PA pressure 31, pulmonary capillary wedge pressure 11, pulmonary vascular resistance 2.2  Records from his visit with cardiology in March 2018 reviewed were he was seen by Dr. Orlean Patten 9 for A. fib, cardiomyopathy, pulmonary hypertension. No changes in his medicines were made at that time.    Assessment & Plan:   Impression: Acute respiratory failure with hypoxemia Healthcare associated pneumonia Pulmonary hypertension End-stage renal disease  Discussion: Theran has failed outpatient therapy for an acute respiratory illness associated with acute respiratory failure with hypoxemia. The differential diagnosis includes healthcare associated pneumonia which I think is most likely. Given his clinical history pulmonary edema is also possible though his physical exam today is not consistent with volume overload and he is at his dry weight of 126 kg. It's not clear to me that his pulmonary hypertension is any worse today.  I doubt that this is related to amiodarone given his sudden onset and subjective fevers.  Plan: Healthcare associated pneumonia: -Vancomycin, cefepime -Blood cultures -Chest x-ray - if no improvement consider CT chest/bronchoscopy  Acute respiratory failure with hypoxemia: -Supplemental oxygen to maintain O2 saturation greater than 88%  End-stage renal disease: -Consult nephrology  Pulmonary hypertension: -Continue Letairis and Adcirca  Afib: -continue amiodarone  -continue warfarin  Feeding: renal diet Analgesia: n/a Sedation: n/a Thromboprophylaxis: warfarin HOB >30 degrees Ulcer prophylaxis: n/a Glucose control:  monitor  Roselie Awkward, MD [place] [facility] Pager: [phone] Cell: [phone] After 3pm or if no response, call [phone]      Current Outpatient Prescriptions:  .  ambrisentan (LETAIRIS) 10 MG tablet, Take 1 tablet (10 mg total) by mouth daily., Disp: 30 tablet, Rfl: 11 .  amiodarone (PACERONE) 200 MG tablet, Take 200 mg by mouth daily., Disp: , Rfl:  .  amoxicillin-clavulanate (AUGMENTIN) 500-125 MG tablet, Take 1 tablet by mouth 2 (two) times daily., Disp: , Rfl:  .  atorvastatin (LIPITOR) 40 MG tablet, TAKE 1 TABLET BY MOUTH DAILY, Disp: 90 tablet, Rfl: 3 .  calcium acetate (PHOSLO) 667 MG capsule, Take 1,334 mg by mouth 3 (three) times daily with meals. , Disp: , Rfl:  .  cinacalcet (SENSIPAR) 90 MG tablet, Take 90 mg by mouth daily., Disp: , Rfl:  .  guaiFENesin-codeine 100-10 MG/5ML syrup, Take 5 mLs by mouth every 6 (six) hours as needed for cough., Disp: 120 mL, Rfl: 0 .  levothyroxine (SYNTHROID, LEVOTHROID) 300 MCG tablet,  Take 300 mcg by mouth daily. , Disp: , Rfl:  .  midodrine (PROAMATINE) 10 MG tablet, Take 1 tablet (10 mg total) by mouth 3 (three) times daily., Disp: 90 tablet, Rfl: 11 .  tadalafil, PAH, (ADCIRCA) 20 MG tablet, 1 daily on dialysis days, and 2 daily on other days, Disp: 60 tablet, Rfl: 11 .  warfarin (COUMADIN) 6 MG tablet, TAKE 1 TO 1.5 TABLETS BY MOUTH DAILY AS DIRECTED BY COUMADIN CLINIC, Disp: 50 tablet, Rfl: 3     Electronically signed by Juanito Doom, MD at 08/05/2016 3:18 PM

## 2016-08-05 NOTE — Patient Instructions (Addendum)
Go to [facility] Admitting

## 2016-08-05 NOTE — Progress Notes (Signed)
Pharmacy Antibiotic Note Barnard Sharps is a 56 y.o. male admitted on 08/05/2016 with pneumonia.  Pharmacy has been consulted for Cefepime and vancomycin dosing in setting of ESRD.  Plan: 1. Cefepime 2 grams IV x 1 now followed by Cefepime 1 gram IV every 24 hours starting on 3/28 at 18:00 2. Vancomycin 2000 mg IV x 1 now; further doses to be scheduled once inpatient dialysis schedule known   Height: 6\' 1"  (185.4 cm) Weight: 278 lb (126.1 kg) IBW/kg (Calculated) : 79.9  Temp (24hrs), Avg:97.6 F (36.4 C), Min:97.4 F (36.3 C), Max:97.7 F (36.5 C)   Allergies  Allergen Reactions  . Enalaprilat Swelling  . Ivp Dye [Iodinated Diagnostic Agents] Nausea And Vomiting  . Vasotec [Enalapril] Swelling    Antimicrobials this admission: 3/27 cefepime >>  3/27 vancomycin  >>   Microbiology results: px  Vincenza Hews, PharmD, BCPS 08/05/2016, 4:40 PM

## 2016-08-05 NOTE — Progress Notes (Signed)
Call received from the lab about pt critical lab value troponin  of 0.15.. I have placed calls twice to the attending physician, still waiting for response at 1830.

## 2016-08-06 DIAGNOSIS — J9601 Acute respiratory failure with hypoxia: Secondary | ICD-10-CM

## 2016-08-06 LAB — RENAL FUNCTION PANEL
Albumin: 2.8 g/dL — ABNORMAL LOW (ref 3.5–5.0)
Anion gap: 15 (ref 5–15)
BUN: 30 mg/dL — AB (ref 6–20)
CHLORIDE: 91 mmol/L — AB (ref 101–111)
CO2: 27 mmol/L (ref 22–32)
Calcium: 7.5 mg/dL — ABNORMAL LOW (ref 8.9–10.3)
Creatinine, Ser: 7.51 mg/dL — ABNORMAL HIGH (ref 0.61–1.24)
GFR calc Af Amer: 9 mL/min — ABNORMAL LOW (ref >60)
GFR calc non Af Amer: 7 mL/min — ABNORMAL LOW (ref >60)
GLUCOSE: 85 mg/dL (ref 65–99)
POTASSIUM: 3.4 mmol/L — AB (ref 3.5–5.1)
Phosphorus: 3 mg/dL (ref 2.5–4.6)
Sodium: 133 mmol/L — ABNORMAL LOW (ref 135–145)

## 2016-08-06 LAB — BASIC METABOLIC PANEL
Anion gap: 16 — ABNORMAL HIGH (ref 5–15)
BUN: 28 mg/dL — AB (ref 6–20)
CALCIUM: 7.7 mg/dL — AB (ref 8.9–10.3)
CHLORIDE: 91 mmol/L — AB (ref 101–111)
CO2: 27 mmol/L (ref 22–32)
Creatinine, Ser: 7.21 mg/dL — ABNORMAL HIGH (ref 0.61–1.24)
GFR calc Af Amer: 9 mL/min — ABNORMAL LOW (ref >60)
GFR calc non Af Amer: 8 mL/min — ABNORMAL LOW (ref >60)
Glucose, Bld: 85 mg/dL (ref 65–99)
Potassium: 3.6 mmol/L (ref 3.5–5.1)
Sodium: 134 mmol/L — ABNORMAL LOW (ref 135–145)

## 2016-08-06 LAB — CBC
HCT: 27.7 % — ABNORMAL LOW (ref 39.0–52.0)
Hemoglobin: 9.2 g/dL — ABNORMAL LOW (ref 13.0–17.0)
MCH: 30.6 pg (ref 26.0–34.0)
MCHC: 33.2 g/dL (ref 30.0–36.0)
MCV: 92 fL (ref 78.0–100.0)
Platelets: 108 10*3/uL — ABNORMAL LOW (ref 150–400)
RBC: 3.01 MIL/uL — ABNORMAL LOW (ref 4.22–5.81)
RDW: 17.8 % — AB (ref 11.5–15.5)
WBC: 5.6 10*3/uL (ref 4.0–10.5)

## 2016-08-06 LAB — HEPATITIS B SURFACE ANTIGEN: Hepatitis B Surface Ag: NEGATIVE

## 2016-08-06 LAB — INFLUENZA PANEL BY PCR (TYPE A & B)
INFLBPCR: NEGATIVE
Influenza A By PCR: NEGATIVE

## 2016-08-06 LAB — PROTIME-INR
INR: 3.18
PROTHROMBIN TIME: 33.3 s — AB (ref 11.4–15.2)

## 2016-08-06 LAB — MRSA PCR SCREENING: MRSA by PCR: NEGATIVE

## 2016-08-06 MED ORDER — VANCOMYCIN HCL IN DEXTROSE 1-5 GM/200ML-% IV SOLN
1000.0000 mg | INTRAVENOUS | Status: AC
  Administered 2016-08-06: 1000 mg via INTRAVENOUS

## 2016-08-06 MED ORDER — MIDODRINE HCL 5 MG PO TABS
ORAL_TABLET | ORAL | Status: AC
  Administered 2016-08-06: 10 mg via ORAL
  Filled 2016-08-06: qty 2

## 2016-08-06 MED ORDER — MIDODRINE HCL 5 MG PO TABS
10.0000 mg | ORAL_TABLET | Freq: Once | ORAL | Status: AC
  Administered 2016-08-06: 10 mg via ORAL

## 2016-08-06 MED ORDER — VANCOMYCIN HCL IN DEXTROSE 1-5 GM/200ML-% IV SOLN
INTRAVENOUS | Status: AC
  Administered 2016-08-06: 1000 mg via INTRAVENOUS
  Filled 2016-08-06: qty 200

## 2016-08-06 MED ORDER — DARBEPOETIN ALFA 60 MCG/0.3ML IJ SOSY
PREFILLED_SYRINGE | INTRAMUSCULAR | Status: AC
  Administered 2016-08-06: 60 ug via INTRAVENOUS
  Filled 2016-08-06: qty 0.3

## 2016-08-06 MED ORDER — WARFARIN SODIUM 3 MG PO TABS
3.0000 mg | ORAL_TABLET | Freq: Once | ORAL | Status: AC
  Administered 2016-08-06: 3 mg via ORAL
  Filled 2016-08-06: qty 1

## 2016-08-06 MED ORDER — DARBEPOETIN ALFA 60 MCG/0.3ML IJ SOSY
60.0000 ug | PREFILLED_SYRINGE | INTRAMUSCULAR | Status: DC
  Administered 2016-08-06: 60 ug via INTRAVENOUS
  Filled 2016-08-06: qty 0.3

## 2016-08-06 NOTE — Consult Note (Signed)
[facility] Renal Consultation Note  Indication for Consultation:  Management of ESRD/hemodialysis; anemia, hypertension/volume and secondary hyperparathyroidism  HPI: Adam [name] is a 56 y.o. male with  ESRD secondary to HTN( does Home hd [facility] followed by Dr. 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 He reported weakness with Dyspnea past week. Had Dry Couhg  With fever and chills with temps 98-99 usually 94-95 temp.Starte on Vancomycin IV, Cefepime  After  blood cultures drawn . [facility].  admitted and working  Acute illness. Now feels stronger with no significant dyspnea. He reports  No issues with home hd recently  "obtaining edw or slightly below this week secondary to poor po intake. Uses Button hole technique with AVF  3hr tx time/"if he does 4hr time bp drops and weakness ''.    Past Medical History:  Diagnosis Date  . AICD (automatic cardioverter/defibrillator) present    [employer]   . Arthritis    "knees" (08/05/2016)  . Atrial fibrillation ([place])   . ESRD (end stage renal disease) on dialysis ([place])    "M, T, W, T, F; NX stage hemodialysis; I do it at home" (08/05/2016)  . Great toe amputation status ([place])    status post left hallux amputation 08/01/14  . History of blood transfusion 2009   "S/P biopsy for prostate cancer check"  . Hypothyroidism (acquired)   . Nonischemic cardiomyopathy ([place])   . Pneumonia 2014; 08/05/2016  . PONV (postoperative nausea and vomiting)   . Pulmonary HTN   . Thyroid cancer ([place]) 2011    Past Surgical History:  Procedure Laterality Date  . CARDIAC  CATHETERIZATION Right 2017  . CARDIAC DEFIBRILLATOR PLACEMENT  2009  . DIALYSIS FISTULA CREATION Left 2003   "upper arm"  . HERNIA REPAIR    . KIDNEY TRANSPLANT  2009  . KNEE SURGERY Bilateral 2000s   "drained fluid"  . LAPAROSCOPIC GASTRIC BANDING  2006  . PROSTATE BIOPSY  2009  . REVISON OF ARTERIOVENOUS FISTULA Left 6/72/0947   Procedure: PLICATION OF ARTERIOVENOUS FISTULA ANEURYSM;  Surgeon: Elam Dutch, MD;  Location: [facility];  Service: Vascular;  Laterality: Left;  . THROMBECTOMY W/ EMBOLECTOMY Left 12/25/2014   Procedure: THROMBECTOMY ARTERIOVENOUS FISTULA;  Surgeon: Elam Dutch, MD;  Location: [facility];  Service: Vascular;  Laterality: Left;  . THYROIDECTOMY  2011  . TOE AMPUTATION Bilateral     right 1st and  2nd digits; left 1st and 3rd digits"  . UMBILICAL HERNIA REPAIR  X 2      Family History  Problem Relation Age of Onset  . Heart failure Mother   . Hypertension Mother   . CAD Mother 31  . Emphysema Mother   . Hypertension Father   . Kidney failure Father       reports that he has never smoked. He has never used smokeless tobacco. He reports that he drinks alcohol. He reports that he does not use drugs.   Allergies  Allergen Reactions  . Enalaprilat Swelling  . Ivp Dye [Iodinated Diagnostic Agents] Nausea And Vomiting  . Vasotec [Enalapril] Swelling    Prior to Admission medications   Medication Sig Start  Date End Date Taking? Authorizing Provider  ambrisentan (LETAIRIS) 10 MG tablet Take 1 tablet (10 mg total) by mouth daily. 06/19/16   Juanito Doom, MD  amiodarone (PACERONE) 200 MG tablet Take 200 mg by mouth daily.    Historical Provider, MD  amoxicillin-clavulanate (AUGMENTIN) 500-125 MG tablet Take 1 tablet by mouth 2 (two) times daily.    Historical Provider, MD  atorvastatin (LIPITOR) 40 MG tablet TAKE 1 TABLET BY MOUTH DAILY 06/11/16   [name], MD  calcium acetate (PHOSLO) 667 MG capsule Take 1,334 mg by mouth 3 (three) times daily with  meals.  04/21/15   Historical Provider, MD  cinacalcet (SENSIPAR) 90 MG tablet Take 90 mg by mouth daily.    Historical Provider, MD  guaiFENesin-codeine 100-10 MG/5ML syrup Take 5 mLs by mouth every 6 (six) hours as needed for cough. 08/04/16   Juanito Doom, MD  levothyroxine (SYNTHROID, LEVOTHROID) 300 MCG tablet Take 300 mcg by mouth daily.  06/04/16   Historical Provider, MD  midodrine (PROAMATINE) 10 MG tablet Take 1 tablet (10 mg total) by mouth 3 (three) times daily. 09/13/15   Minus Breeding, MD  tadalafil, PAH, (ADCIRCA) 20 MG tablet 1 daily on dialysis days, and 2 daily on other days 06/19/16   Juanito Doom, MD  warfarin (COUMADIN) 6 MG tablet TAKE 1 TO 1.5 TABLETS BY MOUTH DAILY AS DIRECTED BY COUMADIN CLINIC 01/07/16   Minus Breeding, MD     Anti-infectives    Start     Dose/Rate Route Frequency Ordered Stop   08/06/16 1800  ceFEPIme (MAXIPIME) 1 g in dextrose 5 % 50 mL IVPB     1 g 100 mL/hr over 30 Minutes Intravenous Every 24 hours 08/05/16 1634     08/05/16 2100  ceFEPIme (MAXIPIME) 2 g in dextrose 5 % 50 mL IVPB     2 g 100 mL/hr over 30 Minutes Intravenous  Once 08/05/16 2003 08/05/16 2154   08/05/16 2100  vancomycin (VANCOCIN) 2,000 mg in sodium chloride 0.9 % 500 mL IVPB     2,000 mg 250 mL/hr over 120 Minutes Intravenous  Once 08/05/16 2003 08/05/16 2324   08/05/16 1645  ceFEPIme (MAXIPIME) 2 g in dextrose 5 % 50 mL IVPB  Status:  Discontinued     2 g 100 mL/hr over 30 Minutes Intravenous  Once 08/05/16 1634 08/05/16 2003   08/05/16 1645  vancomycin (VANCOCIN) 2,000 mg in sodium chloride 0.9 % 500 mL IVPB  Status:  Discontinued     2,000 mg 250 mL/hr over 120 Minutes Intravenous  Once 08/05/16 1634 08/05/16 2003      Results for orders placed or performed during the hospital encounter of 08/05/16 (from the past 48 hour(s))  Comprehensive metabolic panel     Status: Abnormal   Collection Time: 08/05/16  5:00 PM  Result Value Ref Range   Sodium 135 135 - 145  mmol/L   Potassium 3.7 3.5 - 5.1 mmol/L   Chloride 91 (L) 101 - 111 mmol/L   CO2 24 22 - 32 mmol/L   Glucose, Bld 101 (H) 65 - 99 mg/dL   BUN 19 6 - 20 mg/dL   Creatinine, Ser 5.91 (H) 0.61 - 1.24 mg/dL   Calcium 8.3 (L) 8.9 - 10.3 mg/dL   Total Protein 7.1 6.5 - 8.1 g/dL   Albumin 3.3 (L) 3.5 - 5.0 g/dL   AST 59 (H) 15 - 41 U/L   ALT 20 17 - 63 U/L  Alkaline Phosphatase 97 38 - 126 U/L   Total Bilirubin 0.7 0.3 - 1.2 mg/dL   GFR calc non Af Amer 10 (L) >60 mL/min   GFR calc Af Amer 11 (L) >60 mL/min    Comment: (NOTE) The eGFR has been calculated using the CKD EPI equation. This calculation has not been validated in all clinical situations. eGFR's persistently <60 mL/min signify possible Chronic Kidney Disease.    Anion gap 20 (H) 5 - 15  CBC WITH DIFFERENTIAL     Status: Abnormal   Collection Time: 08/05/16  5:00 PM  Result Value Ref Range   WBC 7.3 4.0 - 10.5 K/uL   RBC 3.61 (L) 4.22 - 5.81 MIL/uL   Hemoglobin 10.6 (L) 13.0 - 17.0 g/dL   HCT 34.1 (L) 39.0 - 52.0 %   MCV 94.5 78.0 - 100.0 fL   MCH 29.4 26.0 - 34.0 pg   MCHC 31.1 30.0 - 36.0 g/dL   RDW 17.5 (H) 11.5 - 15.5 %   Platelets 129 (L) 150 - 400 K/uL   Neutrophils Relative % 88 %   Neutro Abs 6.4 1.7 - 7.7 K/uL   Lymphocytes Relative 7 %   Lymphs Abs 0.5 (L) 0.7 - 4.0 K/uL   Monocytes Relative 5 %   Monocytes Absolute 0.3 0.1 - 1.0 K/uL   Eosinophils Relative 0 %   Eosinophils Absolute 0.0 0.0 - 0.7 K/uL   Basophils Relative 0 %   Basophils Absolute 0.0 0.0 - 0.1 K/uL  Protime-INR     Status: Abnormal   Collection Time: 08/05/16  5:00 PM  Result Value Ref Range   Prothrombin Time 31.1 (H) 11.4 - 15.2 seconds   INR 2.92   Troponin I     Status: Abnormal   Collection Time: 08/05/16  5:00 PM  Result Value Ref Range   Troponin I 0.15 (HH) <0.03 ng/mL    Comment: CRITICAL RESULT CALLED TO, READ BACK BY AND VERIFIED WITH: I OMOSEEBI,RN 1751 08/05/16 D BRADLEY   Influenza panel by PCR (type A & B)      Status: None   Collection Time: 08/05/16  6:24 PM  Result Value Ref Range   Influenza A By PCR NEGATIVE NEGATIVE   Influenza B By PCR NEGATIVE NEGATIVE    Comment: (NOTE) The Xpert Xpress Flu assay is intended as an aid in the diagnosis of  influenza and should not be used as a sole basis for treatment.  This  assay is FDA approved for nasopharyngeal swab specimens only. Nasal  washings and aspirates are unacceptable for Xpert Xpress Flu testing.   MRSA PCR Screening     Status: None   Collection Time: 08/05/16 10:25 PM  Result Value Ref Range   MRSA by PCR NEGATIVE NEGATIVE    Comment:        The GeneXpert MRSA Assay (FDA approved for NASAL specimens only), is one component of a comprehensive MRSA colonization surveillance program. It is not intended to diagnose MRSA infection nor to guide or monitor treatment for MRSA infections.   Basic metabolic panel     Status: Abnormal   Collection Time: 08/06/16  7:05 AM  Result Value Ref Range   Sodium 134 (L) 135 - 145 mmol/L   Potassium 3.6 3.5 - 5.1 mmol/L   Chloride 91 (L) 101 - 111 mmol/L   CO2 27 22 - 32 mmol/L   Glucose, Bld 85 65 - 99 mg/dL   BUN 28 (H) 6 - 20 mg/dL  Creatinine, Ser 7.21 (H) 0.61 - 1.24 mg/dL   Calcium 7.7 (L) 8.9 - 10.3 mg/dL   GFR calc non Af Amer 8 (L) >60 mL/min   GFR calc Af Amer 9 (L) >60 mL/min    Comment: (NOTE) The eGFR has been calculated using the CKD EPI equation. This calculation has not been validated in all clinical situations. eGFR's persistently <60 mL/min signify possible Chronic Kidney Disease.    Anion gap 16 (H) 5 - 15  CBC     Status: Abnormal (Preliminary result)   Collection Time: 08/06/16  7:05 AM  Result Value Ref Range   WBC 5.6 4.0 - 10.5 K/uL   RBC 3.01 (L) 4.22 - 5.81 MIL/uL   Hemoglobin 9.2 (L) 13.0 - 17.0 g/dL   HCT 27.7 (L) 39.0 - 52.0 %   MCV 92.0 78.0 - 100.0 fL   MCH 30.6 26.0 - 34.0 pg   MCHC 33.2 30.0 - 36.0 g/dL   RDW 17.8 (H) 11.5 - 15.5 %   Platelets  PENDING 150 - 400 K/uL  Protime-INR     Status: Abnormal   Collection Time: 08/06/16  7:05 AM  Result Value Ref Range   Prothrombin Time 33.3 (H) 11.4 - 15.2 seconds   INR 3.18   .  ROS: see hpi  Physical Exam: Vitals:   08/05/16 2342 08/06/16 0605  BP: (!) 85/55 (!) 86/50  Pulse: 90 82  Resp:  18  Temp:  98.4 F (36.9 C)     General: alert Obese AAM sitting on side of bed  NAD, Chronically ill OX3 HEENT: [place] , MMM,[place] Neck:  No JVD  , supple Heart: RRR  No mur, rub or gallop Lungs: Soft wheezing bases / crackles R lower base , non labored breathing  Abdomen:  OBESE, BS pos. Soft , Non tender, Nondistended Extremities: bilat . Varicose varicosities with trace bipedal edema Skin: no overt rash, or pedal ulcers Neuro: alert ,moves all extrem, no overt focal deficits  Dialysis Access: pos bruit LUA AVF  Dialysis Orders: Center: Home HD  on MOn ,Tues Wed, Thurs , Friday  . EDW 127kg HD Bath 2k  Time 3hrs Heparin . Access LUA AVF BFR 450  DFR 800      Assessment/Plan  1. ESRD - HD schedule [facility] (home HD ) In hosp setting  MWF 3.5 hrs (refuses 4hr =makes my bp drop ") 2. Acute Respiratory Failure with Hypoxemia in setting of HCAP/ Pulm. HTN- On antibiotics, Admit team managing/ does not  appear to be be volume overloaded  3. Hypertension/volume  - On Midodrine 10 mg pre hd and on HD  Last hour/ last bp 86/50 and ASYMPTOMATIC/ Doesn not appear vol, overloaded 4. Anemia of ESRD  - hgb 9.2 no esa listed  [place] Ar[place]p  35mg q [place]  5. Metabolic bone disease -  Ca 7.7  Check renal panel pre HD on calcium acetate binder no vit d  Listed , hold sensipar 90 mg  With low calcium  6. A. Fib - on Amiodarone/ Warfin  Per admit 7. Pulm. HTN- on Letairis and Adcirca  per Pulm admit   DErnest Haber PA-C [facility] Beeper [phone]/28/2018, 9:45 AM   Renal Attending  Renal Attending: Home HD pt who we will provide dialysis for maintenance and  excess volume; I agree with note above. Lynn Recendiz C

## 2016-08-06 NOTE — Assessment & Plan Note (Addendum)
Improved after abx. Not needing o2 on room air. CT chest GGO -> Viral PNA v fluid v bacterial   Plan Walk test in hallway for need for room air and need for amb pulse ox - if normal can go home possibly 08/07/2016 if he is willing Change IV to po levquin - tottal 7d Check RVP (flu PCR negative) Close monitoring as opd  Consider bronch BAL if things do not improve over time

## 2016-08-06 NOTE — Consult Note (Signed)
           [facility] CM Primary Care Navigator  08/06/2016  [name] [phone] [phone]   Went to see patientat the bedside earlier to identify possible discharge needs but staffreports that patient is in hemodialysis at the moment.   Will attempt to meet with patient at another time, when available in room.  For questions, please contact:  Dannielle Huh, BSN, RN- [facility] Primary Care Navigator  Telephone: [phone] [place]

## 2016-08-06 NOTE — Assessment & Plan Note (Signed)
Continue advanced PAH Rx

## 2016-08-06 NOTE — Assessment & Plan Note (Signed)
Per renal In HD 08/06/2016

## 2016-08-06 NOTE — Progress Notes (Signed)
Pt medodrine was discarded into the sharp container in his room pt said he took it before coming for admission, I couldn't return it into the pyxis because it was already opened I showed pills to karren before discarding it

## 2016-08-06 NOTE — Progress Notes (Signed)
ANTICOAGULATION and ANTIBIOTIC CONSULT NOTE - Follow Up Consult  Pharmacy Consult for Coumadin, Vancomycin and Cefepime Indication: atrial fibrillation, HCAP  Allergies  Allergen Reactions  . Enalaprilat Swelling    Mouth swelling  . Ivp Dye [Iodinated Diagnostic Agents] Nausea And Vomiting  . Vasotec [Enalapril] Swelling    Patient Measurements: Height: 6\' 1"  (185.4 cm) Weight: 278 lb 9.6 oz (126.4 kg) IBW/kg (Calculated) : 79.9  Vital Signs: Temp: 98.4 F (36.9 C) (03/28 0605) Temp Source: Oral (03/28 0605) BP: 86/50 (03/28 0605) Pulse Rate: 82 (03/28 0605)  Labs:  Recent Labs  08/05/16 1700 08/06/16 0705  HGB 10.6* 9.2*  HCT 34.1* 27.7*  PLT 129* 108*  LABPROT 31.1* 33.3*  INR 2.92 3.18  CREATININE 5.91* 7.21*  TROPONINI 0.15*  --     Estimated Creatinine Clearance: 16.5 mL/min (A) (by C-G formula based on SCr of 7.21 mg/dL (H)).   Medications:  Scheduled:  . ambrisentan  10 mg Oral Daily  . amiodarone  200 mg Oral Daily  . atorvastatin  40 mg Oral Daily  . calcium acetate  1,334 mg Oral TID WC  . ceFEPime (MAXIPIME) IV  1 g Intravenous Q24H  . cinacalcet  90 mg Oral Q breakfast  . darbepoetin (ARANESP) injection - DIALYSIS  60 mcg Intravenous Q Wed-HD  . guaiFENesin  600 mg Oral BID  . levothyroxine  300 mcg Oral QAC breakfast  . midodrine  10 mg Oral TID  . sodium chloride flush  3 mL Intravenous Q12H  . tadalafil (PAH)  20 mg Oral Daily  . Warfarin - Pharmacist Dosing Inpatient   Does not apply q1800    Assessment: 56 yo M on Coumadin PTA for hx afib.  INR is trending up today 2.92 > 3.18 likely related to decreased intake.  Will decrease Coumadin dose accordingly.  Also started on Vancomycin and Cefepime for HCAP.  Pt was on Augmentin outpt without improvement.  Pt completes dialysis at home daily.  Will transition to 3x week HD while inpatient.  Will need Vancomycin dose today following HD.  Goal of Therapy:  INR 2-3 Monitor platelets by  anticoagulation protocol: Yes  Pre-HD Vancomycin level 20-25   Plan:  Coumadin 3mg  PO x 1 tonight Vancomycin 1gm IV x 1 today following HD Continue Cefepime 1gm IV q24h  Daily INR Follow up cx data and HD schedule for future doses  [employer], Pharm.D., BCPS Clinical Pharmacist Pager [phone] 08/06/2016 11:19 AM

## 2016-08-06 NOTE — Progress Notes (Addendum)
[facility] PCCM progress note  Brief Synopsis: Diagnosed with pulmonary hypertension in 2012. 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 Unfortunately his kidney transplant failed for uncertain reasons by 3825. Was diagnosed with pulmonary hypertension by 2012 in [place]. Was started on [place] in [place] and moved to [place] not long afterwards. Developed atrial fibrillation around 2009, had a defibrillator placed.  08/2015 [facility]: Cardiac Index (l/min/m2) 3.7 L/min/m2  Right Atrium Mean Pressure (mmHg) 5 mmHg   Right Ventricle Systolic Pressure (mmHg) 46 mmHg   Pulmonary Artery Mean Pressure (mmHg) 31 mmHg   Pulmonary Wedge Pressure (mmHg) 11 mmHg   Pulmonary Vascular Resistance (Wood units) 2.2 Wood units   He had a second evaluation for renal transplant at [facility] in 2017 but was denied because of his hypertension and atrial fibrillation.  His dry weight is 126kg, his BP is best there.   HPI 08/05/16      Chief Complaint  Patient presents with  . Follow-up    c/o worsening chest ocngestion, sinus congestion, PND, fatigue, sob X1 week. possible pna?    Lasalle says that he has severe dyspnea ever since last week. Since Friday (when symptoms began), he has had a cough with no mucus. He feels very weak, not eating. He has had chills, and he noted a fever as well. He says that his temperature normally runs 94-95 but lately his has been 98-99. He has been taking juice to some degree. He coughs when he tries to swallow. He says that food doesn't get stuck, but the dyspnea is severe when he coughs.   No leg swelling, no chest pain, no cough.  He has been taking amoxicillin since Friday, no help.  His symptoms have been worsening in the last few days.      SUBJECTIVE/OVERNIGHT/INTERVAL HX 3/28 - much better after abx . Feels he has had pna. Now in HD suite. Not on o2 at home; currently on few liters.  Feels he might be able to go home in a day   Vitalls Vitals:   08/05/16 2217 08/05/16 2342 08/06/16 0605 08/06/16 1135  BP: (!) 71/42 (!) 85/55 (!) 86/50   Pulse: 92 90 82   Resp: (!) 22  18   Temp:   98.4 F (36.9 C) (P) 97.4 F (36.3 C)  TempSrc:   Oral (P) Oral  SpO2: 100%  95%   Weight:   126.4 kg (278 lb 9.6 oz)   Height:         EXAM  General Appearance:    Looks better OBESE - yes  Head:    Normocephalic, without obvious abnormality, atraumatic  Eyes:    PERRL - yes, conjunctiva/corneas - muddy      Ears:    Normal external ear canals, both ears  Nose:   NG tube - no  Throat:  ETT TUBE - no , OG tube - no  Neck:   Supple,  No enlargement/tenderness/nodules     Lungs:     Clear to auscultation bilaterally except rt crackles +  Chest wall:    No deformity  Heart:    S1 and S2 normal, no murmur, CVP - no.  Pressors - no  Abdomen:     Soft, no masses, no organomegaly  Genitalia:    Not done  Rectal:   not done  Extremities:   Extremities- intact but dru     Skin:   Intact in  exposed areas .      Neurologic:   Sedation - none -> RASS - +! Marland Kitchen Moves all 4s - yes. CAM-ICU - neg . Orientation - x3+       LABS  PULMONARY No results for input(s): PHART, PCO2ART, PO2ART, HCO3, TCO2, O2SAT in the last 168 hours.  Invalid input(s): PCO2, PO2  CBC  Recent Labs Lab 08/05/16 1700 08/06/16 0705  HGB 10.6* 9.2*  HCT 34.1* 27.7*  WBC 7.3 5.6  PLT 129* 108*    COAGULATION  Recent Labs Lab 08/05/16 1700 08/06/16 0705  INR 2.92 3.18    CARDIAC   Recent Labs Lab 08/05/16 1700  TROPONINI 0.15*   No results for input(s): PROBNP in the last 168 hours.   CHEMISTRY  Recent Labs Lab 08/05/16 1700 08/06/16 0705 08/06/16 1226  NA 135 134* 133*  K 3.7 3.6 3.4*  CL 91* 91* 91*  CO2 24 27 27   GLUCOSE 101* 85 85  BUN 19 28* 30*  CREATININE 5.91* 7.21* 7.51*  CALCIUM 8.3* 7.7* 7.5*  PHOS  --   --  3.0   Estimated Creatinine Clearance: 15.8 mL/min  (A) (by C-G formula based on SCr of 7.51 mg/dL (H)).   LIVER  Recent Labs Lab 08/05/16 1700 08/06/16 0705 08/06/16 1226  AST 59*  --   --   ALT 20  --   --   ALKPHOS 97  --   --   BILITOT 0.7  --   --   PROT 7.1  --   --   ALBUMIN 3.3*  --  2.8*  INR 2.92 3.18  --      INFECTIOUS No results for input(s): LATICACIDVEN, PROCALCITON in the last 168 hours.   ENDOCRINE CBG (last 3)  No results for input(s): GLUCAP in the last 72 hours.       IMAGING x48h  - image(s) personally visualized  -   highlighted in bold X-ray Chest Pa And Lateral  Result Date: 08/05/2016 CLINICAL DATA:  Pneumonia. Short of breath and dry cough for 3 days. End-stage renal disease. Nonsmoker. EXAM: CHEST  2 VIEW COMPARISON:  08/15/2015 FINDINGS: Pacer/AICD device. Mild right hemidiaphragm elevation. Midline trachea. Moderate cardiomegaly. Atherosclerosis in the transverse aorta. No pleural effusion or pneumothorax. Bibasilar and right greater than left upper lobe airspace disease. IMPRESSION: Cardiomegaly with right greater than left airspace disease. This could represent infection or pulmonary edema. Recommend radiographic follow-up after appropriate therapy to confirm resolution. Electronically Signed   By: Abigail Miyamoto M.D.   On: 08/05/2016 20:23       ASSESSMENT and PLAN  Acute respiratory failure with hypoxia (HCC) Improved after abx but strill needing o2  Plan o2 as toleratd See if hypoxemia improves furhter aftrer HD Get ct chest wo contrast  Pulmonary hypertension ([place]) Continue advanced PAH Rx  ESRD (end stage renal disease) on hemodialysis Per renal In HD 08/06/2016   . Anti-infectives    Start     Dose/Rate Route Frequency Ordered Stop   08/06/16 1800  ceFEPIme (MAXIPIME) 1 g in dextrose 5 % 50 mL IVPB     1 g 100 mL/hr over 30 Minutes Intravenous Every 24 hours 08/05/16 1634     08/06/16 1200  vancomycin (VANCOCIN) IVPB 1000 mg/200 mL premix     1,000 mg 200 mL/hr over  60 Minutes Intravenous Every M-W-F (Hemodialysis) 08/06/16 1120 08/08/16 1159   08/05/16 2100  ceFEPIme (MAXIPIME) 2 g in dextrose 5 % 50 mL IVPB  2 g 100 mL/hr over 30 Minutes Intravenous  Once 08/05/16 2003 08/05/16 2154   08/05/16 2100  vancomycin (VANCOCIN) 2,000 mg in sodium chloride 0.9 % 500 mL IVPB     2,000 mg 250 mL/hr over 120 Minutes Intravenous  Once 08/05/16 2003 08/05/16 2324   08/05/16 1645  ceFEPIme (MAXIPIME) 2 g in dextrose 5 % 50 mL IVPB  Status:  Discontinued     2 g 100 mL/hr over 30 Minutes Intravenous  Once 08/05/16 1634 08/05/16 2003   08/05/16 1645  vancomycin (VANCOCIN) 2,000 mg in sodium chloride 0.9 % 500 mL IVPB  Status:  Discontinued     2,000 mg 250 mL/hr over 120 Minutes Intravenous  Once 08/05/16 1634 08/05/16 2003         FAMILY  - Updates: 08/06/2016 --> patient updated  - Inter-disciplinary family meet or Palliative Care meeting due by:  DAy 7. Current LOS is LOS 1 days  CODE STATUS    Code Status Orders        Start     Ordered   08/05/16 1629  Full code  Continuous     08/05/16 1628    Code Status History    Date Active Date Inactive Code Status Order ID Comments User Context   12/24/2014  5:06 PM 12/25/2014  6:59 PM Full Code [id number]  Elam Dutch, MD ED        DISPO Keep in floor . Possible dc 08/07/16       Dr. Brand Males, M.D., F.C.C.P Pulmonary and Critical Care Medicine Staff Physician [facility] Pager: [phone], If no answer or between  15:00h - 7:00h: call [phone]  08/06/2016 2:24 PM

## 2016-08-07 ENCOUNTER — Inpatient Hospital Stay (HOSPITAL_COMMUNITY): Payer: Medicare Other

## 2016-08-07 ENCOUNTER — Telehealth: Payer: Self-pay | Admitting: Pulmonary Disease

## 2016-08-07 DIAGNOSIS — I272 Pulmonary hypertension, unspecified: Secondary | ICD-10-CM

## 2016-08-07 LAB — PROTIME-INR
INR: 3.64
Prothrombin Time: 37.1 seconds — ABNORMAL HIGH (ref 11.4–15.2)

## 2016-08-07 MED ORDER — LEVOFLOXACIN 750 MG PO TABS
750.0000 mg | ORAL_TABLET | Freq: Once | ORAL | Status: AC
  Administered 2016-08-07: 750 mg via ORAL
  Filled 2016-08-07: qty 1

## 2016-08-07 MED ORDER — LEVOFLOXACIN 500 MG PO TABS: 500.0000 mg | ORAL_TABLET | ORAL | Status: DC

## 2016-08-07 MED ORDER — IPRATROPIUM-ALBUTEROL 0.5-2.5 (3) MG/3ML IN SOLN
3.0000 mL | Freq: Four times a day (QID) | RESPIRATORY_TRACT | Status: DC
  Administered 2016-08-07 – 2016-08-09 (×5): 3 mL via RESPIRATORY_TRACT
  Filled 2016-08-07 (×5): qty 3

## 2016-08-07 NOTE — Progress Notes (Signed)
[place] [facility] progress note  Brief Synopsis: Diagnosed with pulmonary hypertension in 2012. 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 Unfortunately his kidney transplant failed for uncertain reasons by 1610. Was diagnosed with pulmonary hypertension by 2012 in [place]. Was started on [place] in [place] and moved to [place] not long afterwards. Developed atrial fibrillation around 2009, had a defibrillator placed.  08/2015 [facility]: Cardiac Index (l/min/m2) 3.7 L/min/m2  Right Atrium Mean Pressure (mmHg) 5 mmHg   Right Ventricle Systolic Pressure (mmHg) 46 mmHg   Pulmonary Artery Mean Pressure (mmHg) 31 mmHg   Pulmonary Wedge Pressure (mmHg) 11 mmHg   Pulmonary Vascular Resistance (Wood units) 2.2 Wood units   He had a second evaluation for renal transplant at [facility] in 2017 but was denied because of his hypertension and atrial fibrillation.  His dry weight is 126kg, his BP is best there.   HPI 08/05/16      Chief Complaint  Patient presents with  . Follow-up    c/o worsening chest ocngestion, sinus congestion, PND, fatigue, sob X1 week. possible pna?    Therman says that he has severe dyspnea ever since last week. Since Friday (when symptoms began), he has had a cough with no mucus. He feels very weak, not eating. He has had chills, and he noted a fever as well. He says that his temperature normally runs 94-95 but lately his has been 98-99. He has been taking juice to some degree. He coughs when he tries to swallow. He says that food doesn't get stuck, but the dyspnea is severe when he coughs.   No leg swelling, no chest pain, no cough.  He has been taking amoxicillin since Friday, no help.  His symptoms have been worsening in the last few days.    3/28 - much better after abx . Feels he has had pna. Now in HD suite. Not on o2 at home; currently on few liters. Feels he might be able to go home in a  day   SUBJECTIVE/OVERNIGHT/INTERVAL HX 3/29 - improved but very frustrated he is not at baseline. Describes viral prodrome prior to being ill. Stil dyspneic and not at baseline. He had hard time  Understanding current Rx plan for pneuonia. Feels he should be baseline by now   Vitalls Vitals:   08/06/16 1634 08/06/16 2122 08/07/16 0618 08/07/16 0745  BP: (!) 89/54 105/60 (!) 86/55   Pulse: 73 73 67   Resp: (!) 24 18 18    Temp: 97.6 F (36.4 C) 97.5 F (36.4 C) 98.3 F (36.8 C)   TempSrc: Oral Oral Oral   SpO2: (!) 89% 96% 100% 98%  Weight:      Height:        EXAM  General Appearance:    Looks normal baseline  Head:    Normocephalic, without obvious abnormality, atraumatic  Eyes:    PERRL - yes, conjunctiva/corneas - clear      Ears:    Normal external ear canals, both ears  Nose:   NG tube - no  Throat:  ETT TUBE - no , OG tube - no  Neck:   Supple,  No enlargement/tenderness/nodules     Lungs:     Clear to auscultation bilaterally,   Chest wall:    No deformity  Heart:    S1 and S2 normal, no murmur, CVP - no.  Pressors - no  Abdomen:     Soft, no masses,  no organomegaly  Genitalia:    Not done  Rectal:   not done  Extremities:   Extremities- intact     Skin:   Intact in exposed areas .     Neurologic:   Sedation - none -> RASS - +1 . Moves all 4s - yes. CAM-ICU - neg . Orientation - x3+       LABS  PULMONARY No results for input(s): PHART, PCO2ART, PO2ART, HCO3, TCO2, O2SAT in the last 168 hours.  Invalid input(s): PCO2, PO2  CBC  Recent Labs Lab 08/05/16 1700 08/06/16 0705  HGB 10.6* 9.2*  HCT 34.1* 27.7*  WBC 7.3 5.6  PLT 129* 108*    COAGULATION  Recent Labs Lab 08/05/16 1700 08/06/16 0705 08/07/16 0605  INR 2.92 3.18 3.64    CARDIAC   Recent Labs Lab 08/05/16 1700  TROPONINI 0.15*   No results for input(s): PROBNP in the last 168 hours.   CHEMISTRY  Recent Labs Lab 08/05/16 1700 08/06/16 0705 08/06/16 1226  NA 135  134* 133*  K 3.7 3.6 3.4*  CL 91* 91* 91*  CO2 24 27 27   GLUCOSE 101* 85 85  BUN 19 28* 30*  CREATININE 5.91* 7.21* 7.51*  CALCIUM 8.3* 7.7* 7.5*  PHOS  --   --  3.0   Estimated Creatinine Clearance: 15.9 mL/min (A) (by C-G formula based on SCr of 7.51 mg/dL (H)).   LIVER  Recent Labs Lab 08/05/16 1700 08/06/16 0705 08/06/16 1226 08/07/16 0605  AST 59*  --   --   --   ALT 20  --   --   --   ALKPHOS 97  --   --   --   BILITOT 0.7  --   --   --   PROT 7.1  --   --   --   ALBUMIN 3.3*  --  2.8*  --   INR 2.92 3.18  --  3.64     INFECTIOUS No results for input(s): LATICACIDVEN, PROCALCITON in the last 168 hours.   ENDOCRINE CBG (last 3)  No results for input(s): GLUCAP in the last 72 hours.       IMAGING x48h  - image(s) personally visualized  -   highlighted in bold X-ray Chest Pa And Lateral  Result Date: 08/05/2016 CLINICAL DATA:  Pneumonia. Short of breath and dry cough for 3 days. End-stage renal disease. Nonsmoker. EXAM: CHEST  2 VIEW COMPARISON:  08/15/2015 FINDINGS: Pacer/AICD device. Mild right hemidiaphragm elevation. Midline trachea. Moderate cardiomegaly. Atherosclerosis in the transverse aorta. No pleural effusion or pneumothorax. Bibasilar and right greater than left upper lobe airspace disease. IMPRESSION: Cardiomegaly with right greater than left airspace disease. This could represent infection or pulmonary edema. Recommend radiographic follow-up after appropriate therapy to confirm resolution. Electronically Signed   By: Abigail Miyamoto M.D.   On: 08/05/2016 20:23   Ct Chest Wo Contrast  Result Date: 08/07/2016 CLINICAL DATA:  Healthcare associated pneumonia. Shortness of breath and cough. History of thyroid cancer. EXAM: CT CHEST WITHOUT CONTRAST TECHNIQUE: Multidetector CT imaging of the chest was performed following the standard protocol without IV contrast. COMPARISON:  Radiographs 08/05/2016 FINDINGS: Cardiovascular: Right-sided pacemaker with leads  in the right atrium and ventricle. Multi chamber cardiomegaly. Coronary artery calcifications. Atherosclerosis of the thoracic aorta, no dissection. Chest wall collaterals are noted, left greater than right. Vascular stent in the left subclavian. Mediastinum/Nodes: Increased number of multiple shotty mediastinal lymph nodes, many of which appear hyperdense. For example a prevascular  node measures 11 mm. Left lower paratracheal node measures 15 mm. Small subcarinal nodes are noted. Limited assessment for hilar adenopathy given lack of IV contrast. No pericardial effusion/thickening. Minimal distal esophageal thickening versus small hiatal hernia. Small hyperdense paraesophageal node. Lungs/Pleura: Multifocal ground-glass opacities throughout both lungs, right greater than left. Relatively diffuse involvement on the right, with mid lower lung zone predominance on the left. Minimal fissural thickening appears is fissural fluid, no large subpulmonic effusion. No definite septal thickening. No pulmonary mass or discrete pulmonary nodule. Upper Abdomen: Abdominal vascular calcifications. No acute abnormality. Musculoskeletal: Ossified intra-articular body in the left shoulder. Osteoarthritis of the left glenohumeral joint. There are no acute or suspicious osseous abnormalities. IMPRESSION: 1. Bilateral ground-glass opacities, right greater than left. Differential remains pulmonary edema versus infection. Infection is favored given lack of septal thickening or pleural effusions to suggest pulmonary edema. 2. Cardiomegaly and coronary artery calcifications. Thoracic aortic atherosclerosis. 3. Shotty mediastinal lymph nodes, some of which are hyperdense. This is nonspecific finding, however can be seen in the setting of papillary thyroid cancer metastasis. Recommend correlation with histopathologic results, given patient's with history of thyroid cancer. Electronically Signed   By: Jeb Levering M.D.   On: 08/07/2016  04:04       ASSESSMENT and PLAN  Acute respiratory failure with hypoxia (HCC) Improved after abx. Not needing o2 on room air. CT chest GGO -> Viral PNA v fluid v bacterial   Plan Walk test in hallway for need for room air and need for amb pulse ox - if normal can go home possibly 08/07/2016 if he is willing Change IV to po levquin - tottal 7d Check RVP (flu PCR negative) Close monitoring as opd  Consider bronch BAL if things do not improve over time   Pulmonary hypertension ([place]) Continue advanced PAH Rx  ESRD (end stage renal disease) on hemodialysis Per renal In HD 08/06/2016   HCAP (healthcare-associated pneumonia) See resp failures ection      FAMILY  - Updates: 08/07/2016 --> patient and male member in room  - Inter-disciplinary family meet or Palliative Care meeting due by:  DAy 7. Current LOS is LOS 2 days  CODE STATUS    Code Status Orders        Start     Ordered   08/05/16 1629  Full code  Continuous     08/05/16 1628    Code Status History    Date Active Date Inactive Code Status Order ID Comments User Context   12/24/2014  5:06 PM 12/25/2014  6:59 PM Full Code [id number]  Elam Dutch, MD ED        DISPO Possible dc 08/07/2016     Dr. Brand Males, M.D., F.C.C.P Pulmonary and Critical Care Medicine Staff Physician [place] [facility] Pager: [phone], If no answer or between  15:00h - 7:00h: call [phone]  08/07/2016 11:58 AM

## 2016-08-07 NOTE — Assessment & Plan Note (Signed)
See resp failures ection

## 2016-08-07 NOTE — Telephone Encounter (Signed)
Called and spoke with pt and he stated that he has some concerns about his care:  He stated that BQ admitted him to the hospital yesterday.  He is upset that they have run all these tests on him and he has seen a couple of doctors that he does not know and they do not know him.  He stated that he wanted an update on the results of these tests and what the plan is.  He is not happy about not being able to see BQ while he has been in the hospital.  I explained to the pt that BQ has been in the office seeing pts.  He would like an update from BQ.  Please advise.  Thanks  Allergies  Allergen Reactions  . Enalaprilat Swelling    Mouth swelling  . Ivp Dye [Iodinated Diagnostic Agents] Nausea And Vomiting  . Vasotec [Enalapril] Swelling

## 2016-08-07 NOTE — Progress Notes (Signed)
ANTICOAGULATION and ANTIBIOTIC CONSULT NOTE - Follow Up Consult  Pharmacy Consult for Coumadin and Levaquin Indication: atrial fibrillation, HCAP  Allergies  Allergen Reactions  . Enalaprilat Swelling    Mouth swelling  . Ivp Dye [Iodinated Diagnostic Agents] Nausea And Vomiting  . Vasotec [Enalapril] Swelling    Patient Measurements: Height: 6\' 1"  (185.4 cm) Weight: 279 lb 12.2 oz (126.9 kg) IBW/kg (Calculated) : 79.9  Vital Signs: Temp: 98.3 F (36.8 C) (03/29 0618) Temp Source: Oral (03/29 0618) BP: 86/55 (03/29 0618) Pulse Rate: 67 (03/29 0618)  Labs:  Recent Labs  08/05/16 1700 08/06/16 0705 08/06/16 1226 08/07/16 0605  HGB 10.6* 9.2*  --   --   HCT 34.1* 27.7*  --   --   PLT 129* 108*  --   --   LABPROT 31.1* 33.3*  --  37.1*  INR 2.92 3.18  --  3.64  CREATININE 5.91* 7.21* 7.51*  --   TROPONINI 0.15*  --   --   --     Estimated Creatinine Clearance: 15.9 mL/min (A) (by C-G formula based on SCr of 7.51 mg/dL (H)).   Medications:  Scheduled:  . ambrisentan  10 mg Oral Daily  . amiodarone  200 mg Oral Daily  . atorvastatin  40 mg Oral Daily  . calcium acetate  1,334 mg Oral TID WC  . cinacalcet  90 mg Oral Q breakfast  . darbepoetin (ARANESP) injection - DIALYSIS  60 mcg Intravenous Q Wed-HD  . guaiFENesin  600 mg Oral BID  . levothyroxine  300 mcg Oral QAC breakfast  . midodrine  10 mg Oral TID  . sodium chloride flush  3 mL Intravenous Q12H  . tadalafil (PAH)  20 mg Oral Daily  . Warfarin - Pharmacist Dosing Inpatient   Does not apply q1800    Assessment: 56 yo M on Coumadin PTA for hx afib.  INR continues to trend up likely related to acute illness and decreased intake.  No bleeding noted.  No Coumadin today.  Also started on Vancomycin and Cefepime for HCAP.  Clinically pt improved.  Per PCCM will change abx to PO Levaquin to complete 7 day course.  Stop date 4/2.   Goal of Therapy:  INR 2-3 Monitor platelets by anticoagulation protocol: Yes     Plan:  No [place] tonight Levaquin 750mg  Po x 1, then 500mg  PO q48h (stop date 4/2). Daily INR  [employer], Pharm.D., BCPS Clinical Pharmacist Pager [phone] 08/07/2016 12:07 PM

## 2016-08-07 NOTE — Telephone Encounter (Signed)
I called Adam [name] to discuss his diagnosis and treatment plans.  Questions answered.

## 2016-08-07 NOTE — Progress Notes (Signed)
   Pulse ox 86% on RA per RN at rest. Per APP: patient wants to meet Dr Lake Bells.   Plan o2 for pulse ox > 88% - start 2LNC Cancel dc plans APP Explained Dr Lake Bells being represented by PCCM  Dr. Brand Males, M.D., [facility]C.P Pulmonary and Critical Care Medicine Staff Physician [place] [facility] Pager: [phone], If no answer or between  15:00h - 7:00h: call [phone]  08/07/2016 3:04 PM

## 2016-08-07 NOTE — Progress Notes (Signed)
Subjective:  Tolerated hd Yesterday/ breathing better feels stronger   Objective Vital signs in last 24 hours: Vitals:   08/06/16 1634 08/06/16 2122 08/07/16 0618 08/07/16 0745  BP: (!) 89/54 105/60 (!) 86/55   Pulse: 73 73 67   Resp: (!) 24 18 18    Temp: 97.6 F (36.4 C) 97.5 F (36.4 C) 98.3 F (36.8 C)   TempSrc: Oral Oral Oral   SpO2: (!) 89% 96% 100% 98%  Weight:      Height:       Weight change: 1.4 kg (3 lb 1.4 oz)  Physical Exam: General: alert Obese AAM sitting in chair  bed  NAD, Chronically ill OX3 Heart: RRR  No mur, rub or gallop Lungs: Soft  crackles R lower base , non labored breathing  Abdomen:  OBESE, BS pos. Soft , Non tender, Nondistended Extremities: bilat . Varicose varicosities with trace bipedal edema Dialysis Access: pos bruit LUA AVFdal ulcers   OP Dialysis Orders: Center: Home HD  on MOn ,Tues Wed, Thurs , Friday  . EDW 127kg HD Bath 2k  Time 3hrs Heparin . Access LUA AVF BFR 450  DFR 800 / NO hep ="since Im on coumadin"  Problem/Plan: 1. ESRD - HD schedule [facility] (home HD ) In hosp setting  MWF 3.5 hrs (refuses 4hr =makes my bp drop ") k3.4 yest.  2. Acute Respiratory Failure with Hypoxemia in setting of HCAP/ Pulm. HTN- On antibiotics, Admit team managing/ does not  appear to be be volume overloaded / CT chest= Ground glass opacities R >L 3. Hypertension/volume  - On Midodrine 10 mg pre hd and on HD  Last hour/ last bp 86/55 and ASYMPTOMATIC up in chair  On rounds / psot hd wt 126.9  yest   With bp stable for him , attempt 1.5 to 2l in am with hd . 4. Anemia of ESRD  - hgb 9.2 no esa listed  Will start Aranesp  66mcg q wed HD while in [place].  5. Metabolic bone disease -   Ca = 7.5  And Ca  Corrected 8.4  / phos 3.0 /  on calcium acetate binder 2 ac /no vit d / , hold sensipar 90 mg  With lowish  calcium  6. A. Fib - on Amiodarone/ Warfin  Per admit 1. Pulm. HTN- on Letairis and Adcirca  per Pulm admit  Ernest Haber, PA-C [facility] Beeper [phone] 08/07/2016,11:21 AM  LOS: 2 days    Renal Attending: No fluid off with HD due to low BP although he thinks breathing is better. Demeisha Geraghty C  Labs: Basic Metabolic Panel:  Recent Labs Lab 08/05/16 1700 08/06/16 0705 08/06/16 1226  NA 135 134* 133*  K 3.7 3.6 3.4*  CL 91* 91* 91*  CO2 24 27 27   GLUCOSE 101* 85 85  BUN 19 28* 30*  CREATININE 5.91* 7.21* 7.51*  CALCIUM 8.3* 7.7* 7.5*  PHOS  --   --  3.0   Liver Function Tests:  Recent Labs Lab 08/05/16 1700 08/06/16 1226  AST 59*  --   ALT 20  --   ALKPHOS 97  --   BILITOT 0.7  --   PROT 7.1  --   ALBUMIN 3.3* 2.8*   CBC:  Recent Labs Lab 08/05/16 1700 08/06/16 0705  WBC 7.3 5.6  NEUTROABS 6.4  --   HGB 10.6* 9.2*  HCT 34.1* 27.7*  MCV 94.5 92.0  PLT 129* 108*   Cardiac Enzymes:  Recent Labs  Lab 08/05/16 1700  TROPONINI 0.15*   Medications:  . ambrisentan  10 mg Oral Daily  . amiodarone  200 mg Oral Daily  . atorvastatin  40 mg Oral Daily  . calcium acetate  1,334 mg Oral TID WC  . ceFEPime (MAXIPIME) IV  1 g Intravenous Q24H  . cinacalcet  90 mg Oral Q breakfast  . darbepoetin (ARANESP) injection - DIALYSIS  60 mcg Intravenous Q Wed-HD  . guaiFENesin  600 mg Oral BID  . levothyroxine  300 mcg Oral QAC breakfast  . midodrine  10 mg Oral TID  . sodium chloride flush  3 mL Intravenous Q12H  . tadalafil (PAH)  20 mg Oral Daily  . Warfarin - Pharmacist Dosing Inpatient   Does not apply [phone]

## 2016-08-08 LAB — RENAL FUNCTION PANEL
ALBUMIN: 2.7 g/dL — AB (ref 3.5–5.0)
ANION GAP: 11 (ref 5–15)
BUN: 13 mg/dL (ref 6–20)
CO2: 28 mmol/L (ref 22–32)
Calcium: 7.3 mg/dL — ABNORMAL LOW (ref 8.9–10.3)
Chloride: 98 mmol/L — ABNORMAL LOW (ref 101–111)
Creatinine, Ser: 4.09 mg/dL — ABNORMAL HIGH (ref 0.61–1.24)
GFR calc Af Amer: 18 mL/min — ABNORMAL LOW (ref >60)
GFR, EST NON AFRICAN AMERICAN: 15 mL/min — AB (ref >60)
Glucose, Bld: 82 mg/dL (ref 65–99)
POTASSIUM: 2.8 mmol/L — AB (ref 3.5–5.1)
Phosphorus: 1.3 mg/dL — ABNORMAL LOW (ref 2.5–4.6)
Sodium: 137 mmol/L (ref 135–145)

## 2016-08-08 LAB — RESPIRATORY PANEL BY PCR
ADENOVIRUS-RVPPCR: NOT DETECTED
Bordetella pertussis: NOT DETECTED
CHLAMYDOPHILA PNEUMONIAE-RVPPCR: NOT DETECTED
CORONAVIRUS 229E-RVPPCR: NOT DETECTED
CORONAVIRUS NL63-RVPPCR: NOT DETECTED
Coronavirus HKU1: NOT DETECTED
Coronavirus OC43: NOT DETECTED
Influenza A: NOT DETECTED
Influenza B: NOT DETECTED
MYCOPLASMA PNEUMONIAE-RVPPCR: NOT DETECTED
Metapneumovirus: DETECTED — AB
Parainfluenza Virus 1: NOT DETECTED
Parainfluenza Virus 2: NOT DETECTED
Parainfluenza Virus 3: NOT DETECTED
Parainfluenza Virus 4: NOT DETECTED
Respiratory Syncytial Virus: NOT DETECTED
Rhinovirus / Enterovirus: NOT DETECTED

## 2016-08-08 LAB — CBC
HEMATOCRIT: 26.3 % — AB (ref 39.0–52.0)
HEMOGLOBIN: 8.5 g/dL — AB (ref 13.0–17.0)
MCH: 29.7 pg (ref 26.0–34.0)
MCHC: 32.3 g/dL (ref 30.0–36.0)
MCV: 92 fL (ref 78.0–100.0)
Platelets: 104 10*3/uL — ABNORMAL LOW (ref 150–400)
RBC: 2.86 MIL/uL — ABNORMAL LOW (ref 4.22–5.81)
RDW: 17.3 % — AB (ref 11.5–15.5)
WBC: 4.1 10*3/uL (ref 4.0–10.5)

## 2016-08-08 LAB — PROTIME-INR
INR: 4.57 — AB
Prothrombin Time: 44.5 seconds — ABNORMAL HIGH (ref 11.4–15.2)

## 2016-08-08 MED ORDER — MIDODRINE HCL 5 MG PO TABS
ORAL_TABLET | ORAL | Status: AC
  Filled 2016-08-08: qty 2

## 2016-08-08 MED ORDER — CALCIUM ACETATE (PHOS BINDER) 667 MG PO CAPS
2001.0000 mg | ORAL_CAPSULE | Freq: Three times a day (TID) | ORAL | Status: DC
  Administered 2016-08-08 – 2016-08-09 (×3): 2001 mg via ORAL
  Filled 2016-08-08 (×6): qty 3

## 2016-08-08 NOTE — Progress Notes (Signed)
ANTICOAGULATION CONSULT NOTE - Follow Up Consult  Pharmacy Consult for Coumadin  Indication: atrial fibrillation  Allergies  Allergen Reactions  . Enalaprilat Swelling    Mouth swelling  . Ivp Dye [Iodinated Diagnostic Agents] Nausea And Vomiting  . Vasotec [Enalapril] Swelling    Patient Measurements: Height: 6\' 1"  (185.4 cm) Weight: 281 lb 4.9 oz (127.6 kg) (stood to scale ) IBW/kg (Calculated) : 79.9  Vital Signs: Temp: 97.4 F (36.3 C) (03/30 0820) Temp Source: Oral (03/30 0820) BP: 103/64 (03/30 1100) Pulse Rate: 65 (03/30 1100)  Labs:  Recent Labs  08/05/16 1700 08/06/16 0705 08/06/16 1226 08/07/16 0605 08/08/16 0532 08/08/16 1026  HGB 10.6* 9.2*  --   --   --  8.5*  HCT 34.1* 27.7*  --   --   --  26.3*  PLT 129* 108*  --   --   --  104*  LABPROT 31.1* 33.3*  --  37.1* 44.5*  --   INR 2.92 3.18  --  3.64 4.57*  --   CREATININE 5.91* 7.21* 7.51*  --   --  4.09*  TROPONINI 0.15*  --   --   --   --   --     Estimated Creatinine Clearance: 29.2 mL/min (A) (by C-G formula based on SCr of 4.09 mg/dL (H)).  Assessment: 56 yo M on Coumadin PTA for hx afib.  INR continues to trend up (4.57 this morning) likely related to acute illness and decreased intake in addition to antibiotics.   Hgb 8.5, plts 104- No bleeding noted. Also on Aranesp, iron studies ordered for tomorrow morning.  Goal of Therapy:  INR 2-3 Monitor platelets by anticoagulation protocol: Yes    Plan:  No Coumadin tonight  Daily INR  Bladyn Tipps D. Aseel Uhde, PharmD, BCPS Clinical Pharmacist Pager: [phone] 08/08/2016 11:19 AM

## 2016-08-08 NOTE — Progress Notes (Signed)
CRITICAL VALUE ALERT  Critical value received:  INR 4.57  Date of notification: 08/08/16  Time of notification:  0715 Critical value read back:Yes.    Nurse who received alert: Girl Schissler  MD notified (1st page): Ramaswamy  Time of first page:  0719  MD notified (2nd page):  Time of second page:  Responding MD:  Gerome Apley Time MD responded:  [phone]

## 2016-08-08 NOTE — Procedures (Signed)
Tol HD persistently soft BP Goal about 1 liter.  CT reviewed suggestive of infectious process. Emaad Nanna C

## 2016-08-08 NOTE — Consult Note (Signed)
            [facility] CM Primary Care Navigator  08/08/2016  Adam [name] February 14, 1961 [id number]   Went back to see patientin the roomto identify possible discharge needs but staffreports that patient is currently in hemodialysis at the moment.  Will attempt to meet with patient at another time, when available in room.  For additional questions please contact:  Edwena Felty A. Loney Domingo, BSN, RN-BC [facility] PRIMARY CARE Navigator Cell: [phone]

## 2016-08-08 NOTE — Progress Notes (Signed)
[facility] progress note  Brief Synopsis: Diagnosed with pulmonary hypertension in 2012. 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 Unfortunately his kidney transplant failed for uncertain reasons by 3151. Was diagnosed with pulmonary hypertension by 2012 in [place]. Was started on Congo in [place] and moved to [place] not long afterwards. Developed atrial fibrillation around 2009, had a defibrillator placed.  08/2015 [facility]: Cardiac Index (l/min/m2) 3.7 L/min/m2  Right Atrium Mean Pressure (mmHg) 5 mmHg   Right Ventricle Systolic Pressure (mmHg) 46 mmHg   Pulmonary Artery Mean Pressure (mmHg) 31 mmHg   Pulmonary Wedge Pressure (mmHg) 11 mmHg   Pulmonary Vascular Resistance (Wood units) 2.2 Wood units   He had a second evaluation for renal transplant at [facility] in 2017 but was denied because of his hypertension and atrial fibrillation.  His dry weight is 126kg, his BP is best there.   HPI 08/05/16      Chief Complaint  Patient presents with  . Follow-up    c/o worsening chest ocngestion, sinus congestion, PND, fatigue, sob X1 week. possible pna?    Elson says that he has severe dyspnea ever since last week. Since Friday (when symptoms began), he has had a cough with no mucus. He feels very weak, not eating. He has had chills, and he noted a fever as well. He says that his temperature normally runs 94-95 but lately his has been 98-99. He has been taking juice to some degree. He coughs when he tries to swallow. He says that food doesn't get stuck, but the dyspnea is severe when he coughs.   No leg swelling, no chest pain, no cough.  He has been taking amoxicillin since Friday, no help.  His symptoms have been worsening in the last few days.    3/28 - much better after abx . Feels he has had pna. Now in HD suite. Not on o2 at home; currently on few liters. Feels he might be able to go home in a  day   SUBJECTIVE/OVERNIGHT/INTERVAL HX 3/29 - improved but very frustrated he is not at baseline. Describes viral prodrome prior to being ill. Stil dyspneic and not at baseline. He had hard time  Understanding current Rx plan for pneuonia. Feels he should be baseline by now. 3/30 feels better   Vitalls Vitals:   08/08/16 1130 08/08/16 1200 08/08/16 1205 08/08/16 1349  BP: 124/64 (!) 100/58 96/60 (!) 106/91  Pulse: 70 69 69 77  Resp:    16  Temp:   97.7 F (36.5 C) 97.5 F (36.4 C)  TempSrc:   Oral Oral  SpO2:   93% 94%  Weight:   126.6 kg (279 lb 1.6 oz)   Height:        EXAM  General Appearance:    Looks normal baseline, on O2 in hemo  Head:    Normocephalic, without obvious abnormality, atraumatic  Eyes:    PERRL - yes, conjunctiva/corneas - clear      Ears:    Normal external ear canals, both ears  Nose:   NG tube - no  Throat:  ETT TUBE - no , OG tube - no  Neck:   Supple,  No enlargement/tenderness/nodules     Lungs:     Clear to auscultation bilaterally, decreased bs bases  Chest wall:    No deformity  Heart:    S1 and S2 normal, no murmur, CVP - no.  Pressors - no  Abdomen:     Soft, no masses, no organomegaly  Genitalia:    Not done  Rectal:   not done  Extremities:   Extremities- intact     Skin:   Intact in exposed areas .     Neurologic:   Sedation - none -> RASS - +1 . Moves all 4s - yes. CAM-ICU - neg . Orientation - x3+       LABS  PULMONARY No results for input(s): PHART, PCO2ART, PO2ART, HCO3, TCO2, O2SAT in the last 168 hours.  Invalid input(s): PCO2, PO2  CBC  Recent Labs Lab 08/05/16 1700 08/06/16 0705 08/08/16 1026  HGB 10.6* 9.2* 8.5*  HCT 34.1* 27.7* 26.3*  WBC 7.3 5.6 4.1  PLT 129* 108* 104*    COAGULATION  Recent Labs Lab 08/05/16 1700 08/06/16 0705 08/07/16 0605 08/08/16 0532  INR 2.92 3.18 3.64 4.57*    CARDIAC    Recent Labs Lab 08/05/16 1700  TROPONINI 0.15*   No results for input(s): PROBNP in the last  168 hours.   CHEMISTRY  Recent Labs Lab 08/05/16 1700 08/06/16 0705 08/06/16 1226 08/08/16 1026  NA 135 134* 133* 137  K 3.7 3.6 3.4* 2.8*  CL 91* 91* 91* 98*  CO2 24 27 27 28   GLUCOSE 101* 85 85 82  BUN 19 28* 30* 13  CREATININE 5.91* 7.21* 7.51* 4.09*  CALCIUM 8.3* 7.7* 7.5* 7.3*  PHOS  --   --  3.0 1.3*   Estimated Creatinine Clearance: 29.1 mL/min (A) (by C-G formula based on SCr of 4.09 mg/dL (H)).   LIVER  Recent Labs Lab 08/05/16 1700 08/06/16 0705 08/06/16 1226 08/07/16 0605 08/08/16 0532 08/08/16 1026  AST 59*  --   --   --   --   --   ALT 20  --   --   --   --   --   ALKPHOS 97  --   --   --   --   --   BILITOT 0.7  --   --   --   --   --   PROT 7.1  --   --   --   --   --   ALBUMIN 3.3*  --  2.8*  --   --  2.7*  INR 2.92 3.18  --  3.64 4.57*  --      INFECTIOUS No results for input(s): LATICACIDVEN, PROCALCITON in the last 168 hours.   ENDOCRINE CBG (last 3)  No results for input(s): GLUCAP in the last 72 hours.       IMAGING x48h  - image(s) personally visualized  -   highlighted in bold Ct Chest Wo Contrast  Result Date: 08/07/2016 CLINICAL DATA:  Healthcare associated pneumonia. Shortness of breath and cough. History of thyroid cancer. EXAM: CT CHEST WITHOUT CONTRAST TECHNIQUE: Multidetector CT imaging of the chest was performed following the standard protocol without IV contrast. COMPARISON:  Radiographs 08/05/2016 FINDINGS: Cardiovascular: Right-sided pacemaker with leads in the right atrium and ventricle. Multi chamber cardiomegaly. Coronary artery calcifications. Atherosclerosis of the thoracic aorta, no dissection. Chest wall collaterals are noted, left greater than right. Vascular stent in the left subclavian. Mediastinum/Nodes: Increased number of multiple shotty mediastinal lymph nodes, many of which appear hyperdense. For example a prevascular node measures 11 mm. Left lower paratracheal node measures 15 mm. Small subcarinal nodes are  noted. Limited assessment for hilar adenopathy given lack of IV contrast. No pericardial effusion/thickening. Minimal distal esophageal thickening versus small  hiatal hernia. Small hyperdense paraesophageal node. Lungs/Pleura: Multifocal ground-glass opacities throughout both lungs, right greater than left. Relatively diffuse involvement on the right, with mid lower lung zone predominance on the left. Minimal fissural thickening appears is fissural fluid, no large subpulmonic effusion. No definite septal thickening. No pulmonary mass or discrete pulmonary nodule. Upper Abdomen: Abdominal vascular calcifications. No acute abnormality. Musculoskeletal: Ossified intra-articular body in the left shoulder. Osteoarthritis of the left glenohumeral joint. There are no acute or suspicious osseous abnormalities. IMPRESSION: 1. Bilateral ground-glass opacities, right greater than left. Differential remains pulmonary edema versus infection. Infection is favored given lack of septal thickening or pleural effusions to suggest pulmonary edema. 2. Cardiomegaly and coronary artery calcifications. Thoracic aortic atherosclerosis. 3. Shotty mediastinal lymph nodes, some of which are hyperdense. This is nonspecific finding, however can be seen in the setting of papillary thyroid cancer metastasis. Recommend correlation with histopathologic results, given patient's with history of thyroid cancer. Electronically Signed   By: Jeb Levering M.D.   On: 08/07/2016 04:04       ASSESSMENT and PLAN  Acute respiratory failure with hypoxia (HCC) Improved after abx. On O2 CT chest GGO -> Viral PNA v fluid v bacterial   Plan When O2 needs decreased then consider dc home. Change IV to po levquin - tottal 7d Check RVP (flu PCR negative) Close monitoring as opd  Consider bronch BAL if things do not improve over time   Pulmonary hypertension (HCC) Continue advanced PAH Rx  ESRD (end stage renal disease) on hemodialysis Per  renal In HD 08/06/2016 HD 3/30 goal 1 litre negative   HCAP (healthcare-associated pneumonia) See resp failures section      FAMILY  - Updates: 08/08/2016 --> patient and male member in room  - Inter-disciplinary family meet or Palliative Care meeting due by:  DAy 7. Current LOS is LOS 3 days  CODE STATUS    Code Status Orders        Start     Ordered   08/05/16 1629  Full code  Continuous     08/05/16 1628    Code Status History    Date Active Date Inactive Code Status Order ID Comments User Context   12/24/2014  5:06 PM 12/25/2014  6:59 PM Full Code [id number]  Elam Dutch, MD ED        DISPO Follow in house for 24 hours at least   [place] Pager [phone] till 3 pm If no answer page [phone] 08/08/2016, 1:52 PM

## 2016-08-09 LAB — FERRITIN: FERRITIN: 2257 ng/mL — AB (ref 24–336)

## 2016-08-09 LAB — PROTIME-INR
INR: 4.05
Prothrombin Time: 40.4 s — ABNORMAL HIGH (ref 11.4–15.2)

## 2016-08-09 LAB — IRON AND TIBC
Iron: 24 ug/dL — ABNORMAL LOW (ref 45–182)
Saturation Ratios: 18 % (ref 17.9–39.5)
TIBC: 130 ug/dL — ABNORMAL LOW (ref 250–450)
UIBC: 106 ug/dL

## 2016-08-09 MED ORDER — LEVOFLOXACIN 500 MG PO TABS: 500.0000 mg | ORAL_TABLET | ORAL | 0 refills | Status: DC

## 2016-08-09 MED ORDER — IPRATROPIUM-ALBUTEROL 0.5-2.5 (3) MG/3ML IN SOLN: 3.0000 mL | RESPIRATORY_TRACT | Status: DC | PRN

## 2016-08-09 NOTE — Discharge Instructions (Signed)
Make an appointment to see Dr. Lake Bells or other provider for 10 days. Call or office [phone] on Monday for that appointment. Call Monday and make an appointment to see Coumadin clinic that DAY. See on Monday. Do not take Coumadin till you have been seen by coumadin clinic. This is important you need levels checked!! Take Levaquin on Saturday and Sunday then stop!!

## 2016-08-09 NOTE — Progress Notes (Signed)
ANTICOAGULATION CONSULT NOTE - Follow Up Consult  Pharmacy Consult for Warfarin  Indication: atrial fibrillation  Allergies  Allergen Reactions  . Enalaprilat Swelling    Mouth swelling  . Ivp Dye [Iodinated Diagnostic Agents] Nausea And Vomiting  . Vasotec [Enalapril] Swelling    Patient Measurements: Height: 6\' 1"  (185.4 cm) Weight: 274 lb 7.6 oz (124.5 kg) IBW/kg (Calculated) : 79.9  Vital Signs: Temp: 98.7 F (37.1 C) (03/31 0620) Temp Source: Oral (03/31 0620) BP: 89/50 (03/31 0620) Pulse Rate: 73 (03/31 0620)  Labs:  Recent Labs  08/06/16 1226 08/07/16 0605 08/08/16 0532 08/08/16 1026 08/09/16 0433  HGB  --   --   --  8.5*  --   HCT  --   --   --  26.3*  --   PLT  --   --   --  104*  --   LABPROT  --  37.1* 44.5*  --  40.4*  INR  --  3.64 4.57*  --  4.05*  CREATININE 7.51*  --   --  4.09*  --     Estimated Creatinine Clearance: 28.9 mL/min (A) (by C-G formula based on SCr of 4.09 mg/dL (H)).  Assessment: 56 yo M on warfarin PTA for hx afib.  INR elevated this am at 4.07 < 4.57, likely related to acute illness and decreased intake in addition to antibiotics.   Hgb 8.5, plts 104- No bleeding noted.  Goal of Therapy:  INR 2-3 Monitor platelets by anticoagulation protocol: Yes    Plan:  Hold warfarin this evening   Daily INR  Vincenza Hews, PharmD, BCPS 08/09/2016, 11:01 AM

## 2016-08-09 NOTE — Progress Notes (Signed)
CRITICAL VALUE ALERT  Critical value received:  INR 4.05  Date of notification:  08/09/16  Time of notification:  0537  Critical value read back: yes  Nurse who received alert:  Candus Braud, Josephine Cables  MD notified (1st page):  Dr. Jimmy Footman  Time of first page:  05:40  MD notified (2nd page):  Time of second page:  Responding MD:  Dr. Jimmy Footman  Time MD responded:  05:41  No new orders given.  Will continue to monitor patient.

## 2016-08-09 NOTE — Progress Notes (Signed)
[facility] Progress Note   Subjective:  Seen in room, lying in bed. No CP or dyspnea, feels better. He tells me he will be discharged today.  Objective Vitals:   08/08/16 2114 08/08/16 2337 08/09/16 0620 08/09/16 0829  BP: (!) 79/48 102/61 (!) 89/50   Pulse: 73 77 73   Resp: 17  17   Temp: 98.5 F (36.9 C)  98.7 F (37.1 C)   TempSrc: Oral  Oral   SpO2: 97%  94% 96%  Weight:   124.5 kg (274 lb 7.6 oz)   Height:       Physical Exam General: Well appearing, NAD. Heart:RRR; no murmur Lungs: CTAB Extremities: No LE edema Dialysis Access:  AVF + bruit  Additional Objective Labs: Basic Metabolic Panel:  Recent Labs Lab 08/06/16 0705 08/06/16 1226 08/08/16 1026  NA 134* 133* 137  K 3.6 3.4* 2.8*  CL 91* 91* 98*  CO2 27 27 28   GLUCOSE 85 85 82  BUN 28* 30* 13  CREATININE 7.21* 7.51* 4.09*  CALCIUM 7.7* 7.5* 7.3*  PHOS  --  3.0 1.3*   Liver Function Tests:  Recent Labs Lab 08/05/16 1700 08/06/16 1226 08/08/16 1026  AST 59*  --   --   ALT 20  --   --   ALKPHOS 97  --   --   BILITOT 0.7  --   --   PROT 7.1  --   --   ALBUMIN 3.3* 2.8* 2.7*   CBC:  Recent Labs Lab 08/05/16 1700 08/06/16 0705 08/08/16 1026  WBC 7.3 5.6 4.1  NEUTROABS 6.4  --   --   HGB 10.6* 9.2* 8.5*  HCT 34.1* 27.7* 26.3*  MCV 94.5 92.0 92.0  PLT 129* 108* 104*   Cardiac Enzymes:  Recent Labs Lab 08/05/16 1700  TROPONINI 0.15*   Iron Studies:  Recent Labs  08/09/16 0433  IRON 24*  TIBC 130*  FERRITIN 2,257*   Medications:  . ambrisentan  10 mg Oral Daily  . amiodarone  200 mg Oral Daily  . atorvastatin  40 mg Oral Daily  . calcium acetate  2,001 mg Oral TID WC  . darbepoetin (ARANESP) injection - DIALYSIS  60 mcg Intravenous Q Wed-HD  . guaiFENesin  600 mg Oral BID  . levofloxacin  500 mg Oral Q48H  . levothyroxine  300 mcg Oral QAC breakfast  . midodrine  10 mg Oral TID  . sodium chloride flush  3 mL Intravenous Q12H  . tadalafil (PAH)  20 mg  Oral Daily  . Warfarin - Pharmacist Dosing Inpatient   Does not apply q1800    Dialysis Orders: Home Hemo on Mon ,Tues Wed, Thurs , Friday . EDW 127kgHD Bath 2kTime 3hrsHeparin . Access LUA AVFBFR 450 DFR 800 / NO hep ="since Im on coumadin"  Assessment/Plan: 1. Acute Respiratory Failure: + Pneumonia and Pulm HTN, improved now. CT chest with ground glass opac R>L 2. ESRD: HD MWF while here, back to usual sched on discharge. 3. HypoTN/volume: BP low, chronic issue. On midodrine. Post- HD weight 126.9, will lower EDW on d/c. 4. Anemia: Hgb 8.5. On Aranesp 66mcg weekly here (last given 3/28). 5. Secondary hyperparathyroidism: Ca slightly low here, sensipar on hold. ?Last phos very low, will repeat and adjust Phoslo if needed. 6. Nutrition: Alb 2.7, needs protein supps. 7. Pulm HTN: On Letairis and Adcirca, per pulmonary. 8. Afib: On amiodarone, warfarin.  Veneta Penton, PA-C 08/09/2016, 10:42 AM  [facility] Pager: [phone])  [phone] Renal Attending: Breathing a little better.  He will f/u with pulmonary and cont home HD.   He needs to be more aggressive with fluid removal but is reticent due to BP. Colum Colt C

## 2016-08-09 NOTE — Progress Notes (Signed)
Patient discharge instructions given and prescription given for Levaquin. Patient IV removed and patient  escorted down to security to get keys. No needs.

## 2016-08-09 NOTE — Discharge Summary (Signed)
Physician Discharge Summary  Patient ID: Adam [name] MRN: [id number] DOB/AGE: 1960-12-17 56 y.o.  Admit date: 08/05/2016 Discharge date: 08/09/2016  Problem List Active Problems:   Pulmonary hypertension ([place])   ESRD (end stage renal disease) on hemodialysis   HCAP (healthcare-associated pneumonia)   Acute respiratory failure with hypoxia (HCC)  HPI: Synopsis: Diagnosed with pulmonary hypertension in 2012. 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 Unfortunately his kidney transplant failed for uncertain reasons by 1540. Was diagnosed with pulmonary hypertension by 2012 in [place]. Was started on [place] in [place] and moved to [place] not long afterwards. Developed atrial fibrillation around 2009, had a defibrillator placed. Hospital Course:  ASSESSMENT and PLAN  Acute respiratory failure with hypoxia (HCC) Improved after abx. On O2 CT chest GGO -> Viral PNA v fluid v bacterial   Plan When O2 needs decreased then consider dc home. Change IV to po levquin - total 7d. No coumadin till of levaquin Check RVP (flu PCR negative) Close monitoring as opd  Consider bronch BAL if things do not improve over time   Pulmonary hypertension ([place]) Continue advanced PAH Rx  ESRD (end stage renal disease) on hemodialysis Per renal In HD 08/06/2016 HD 3/30 goal 1 litre negative   HCAP (healthcare-associated pneumonia) See resp failures section  Afib on coumadin Lab Results  Component Value Date   INR 4.05 (HH) 08/09/2016   INR 4.57 (HH) 08/08/2016   INR 3.64 08/07/2016   Hold coumadin till seen in coumadin clinic.        Labs at discharge Lab Results  Component Value Date   CREATININE 4.09 (H) 08/08/2016   BUN 13 08/08/2016   NA 137 08/08/2016   K 2.8 (L) 08/08/2016   CL 98 (L) 08/08/2016   CO2 28 08/08/2016   Lab Results  Component Value Date   WBC 4.1 08/08/2016   HGB 8.5 (L) 08/08/2016   HCT  26.3 (L) 08/08/2016   MCV 92.0 08/08/2016   PLT 104 (L) 08/08/2016   Lab Results  Component Value Date   ALT 20 08/05/2016   AST 59 (H) 08/05/2016   ALKPHOS 97 08/05/2016   BILITOT 0.7 08/05/2016   Lab Results  Component Value Date   INR 4.05 (HH) 08/09/2016   INR 4.57 (HH) 08/08/2016   INR 3.64 08/07/2016    Current radiology studies No results found.  Disposition:  01-Home or Self Care   Allergies as of 08/09/2016      Reactions   Enalaprilat Swelling   Mouth swelling   Ivp Dye [iodinated Diagnostic Agents] Nausea And Vomiting   Vasotec [enalapril] Swelling      Medication List    STOP taking these medications   amoxicillin-clavulanate 500-125 MG tablet Commonly known as:  AUGMENTIN   warfarin 6 MG tablet Commonly known as:  COUMADIN     TAKE these medications   ambrisentan 10 MG tablet Commonly known as:  LETAIRIS Take 1 tablet (10 mg total) by mouth daily. What changed:  when to take this   amiodarone 200 MG tablet Commonly known as:  PACERONE Take 200 mg by mouth daily.   atorvastatin 40 MG tablet Commonly known as:  LIPITOR TAKE 1 TABLET BY MOUTH DAILY   calcium acetate 667 MG capsule Commonly known as:  PHOSLO Take 1,334 mg by mouth 3 (three) times daily with meals.   cinacalcet 90 MG tablet Commonly known as:  SENSIPAR Take 90 mg by mouth  daily.   levofloxacin 500 MG tablet Commonly known as:  LEVAQUIN Take 1 tablet (500 mg total) by mouth every other day.   levothyroxine 300 MCG tablet Commonly known as:  SYNTHROID, LEVOTHROID Take 300 mcg by mouth daily.   midodrine 10 MG tablet Commonly known as:  PROAMATINE Take 1 tablet (10 mg total) by mouth 3 (three) times daily.   tadalafil (PAH) 20 MG tablet Commonly known as:  ADCIRCA 1 daily on dialysis days, and 2 daily on other days         Discharged Condition: fair  Time spent on discharge greater than 40 minutes.  Vital signs at Discharge. Temp:  [97.5 F (36.4 C)-98.7  F (37.1 C)] 98.7 F (37.1 C) (03/31 0620) Pulse Rate:  [69-77] 73 (03/31 0620) Resp:  [16-17] 17 (03/31 0620) BP: (79-106)/(48-91) 89/50 (03/31 0620) SpO2:  [91 %-97 %] 96 % (03/31 0829) Weight:  [124.5 kg (274 lb 7.6 oz)-126.6 kg (279 lb 1.6 oz)] 124.5 kg (274 lb 7.6 oz) (03/31 3202) Office follow up Special Information or instructions. Need to be seen within 10 days. He is to call for appointment. Needs to go to Coumadin clinic prior to taking coumadin. His INR was >4 Signed: Richardson Landry Minor ACNP [facility] PCCM Pager [phone] till 3 pm If no answer page [phone] 08/09/2016, 11:56 AM

## 2016-08-10 LAB — PARATHYROID HORMONE, INTACT (NO CA): PTH: 199 pg/mL — ABNORMAL HIGH (ref 15–65)

## 2016-08-11 ENCOUNTER — Telehealth: Payer: Self-pay | Admitting: Cardiovascular Disease

## 2016-08-11 ENCOUNTER — Ambulatory Visit (INDEPENDENT_AMBULATORY_CARE_PROVIDER_SITE_OTHER): Payer: Medicare Other | Admitting: Pharmacist Clinician (PhC)/ Clinical Pharmacy Specialist

## 2016-08-11 ENCOUNTER — Telehealth: Payer: Self-pay | Admitting: Cardiology

## 2016-08-11 DIAGNOSIS — Z7901 Long term (current) use of anticoagulants: Secondary | ICD-10-CM

## 2016-08-11 DIAGNOSIS — I4891 Unspecified atrial fibrillation: Secondary | ICD-10-CM

## 2016-08-11 LAB — POCT INR: INR: 3.7

## 2016-08-11 NOTE — Telephone Encounter (Signed)
New Message   Per pt just got out of the hospital for pneumonia & was told that his INR was really high, and he needed to contact the coumadin clinic. Requesting call back

## 2016-08-11 NOTE — Telephone Encounter (Signed)
She said she did not received the surgical clearance that was supposed to have been faxed on 07-31-16. Would you please refax to [phone] O[facility]:CGBKO.

## 2016-08-11 NOTE — Telephone Encounter (Signed)
Patient came into office for INR; see anticoag note

## 2016-08-11 NOTE — Telephone Encounter (Signed)
Clearance refaxed to [facility] at [phone].  Called [facility] to make aware-will return call if do not receive.

## 2016-08-14 ENCOUNTER — Telehealth: Payer: Self-pay | Admitting: Endocrinology

## 2016-08-14 NOTE — Telephone Encounter (Signed)
Rec'd from [place] [facility] forward 34 pages to [place]

## 2016-08-20 ENCOUNTER — Other Ambulatory Visit (HOSPITAL_COMMUNITY)
Admission: RE | Admit: 2016-08-20 | Discharge: 2016-08-20 | Disposition: A | Discharge status: Home / Self Care | Payer: Medicare Other | Source: Ambulatory Visit | Attending: Radiology | Admitting: Radiology

## 2016-08-20 ENCOUNTER — Ambulatory Visit
Admission: RE | Admit: 2016-08-20 | Discharge: 2016-08-20 | Disposition: A | Discharge status: Home / Self Care | Payer: Medicare Other | Source: Ambulatory Visit | Attending: Endocrinology | Admitting: Endocrinology

## 2016-08-20 DIAGNOSIS — C73 Malignant neoplasm of thyroid gland: Secondary | ICD-10-CM

## 2016-08-20 DIAGNOSIS — E041 Nontoxic single thyroid nodule: Secondary | ICD-10-CM | POA: Diagnosis present

## 2016-08-21 ENCOUNTER — Inpatient Hospital Stay: Payer: Medicare Other | Admitting: Acute Care

## 2016-08-22 ENCOUNTER — Other Ambulatory Visit: Payer: Self-pay | Admitting: *Deleted

## 2016-08-22 ENCOUNTER — Ambulatory Visit (INDEPENDENT_AMBULATORY_CARE_PROVIDER_SITE_OTHER): Payer: Medicare Other | Admitting: Podiatry

## 2016-08-22 DIAGNOSIS — Q828 Other specified congenital malformations of skin: Secondary | ICD-10-CM

## 2016-08-22 DIAGNOSIS — L84 Corns and callosities: Secondary | ICD-10-CM

## 2016-08-22 DIAGNOSIS — M79674 Pain in right toe(s): Secondary | ICD-10-CM

## 2016-08-22 DIAGNOSIS — I739 Peripheral vascular disease, unspecified: Secondary | ICD-10-CM

## 2016-08-22 DIAGNOSIS — M79675 Pain in left toe(s): Secondary | ICD-10-CM

## 2016-08-22 DIAGNOSIS — B351 Tinea unguium: Secondary | ICD-10-CM

## 2016-08-22 DIAGNOSIS — E1149 Type 2 diabetes mellitus with other diabetic neurological complication: Secondary | ICD-10-CM | POA: Diagnosis not present

## 2016-08-22 MED ORDER — MIDODRINE HCL 10 MG PO TABS: 10.0000 mg | ORAL_TABLET | Freq: Three times a day (TID) | ORAL | 6 refills | Status: DC

## 2016-08-23 LAB — PROTIME-INR: INR: 1.5 — AB (ref 0.9–1.1)

## 2016-08-25 ENCOUNTER — Ambulatory Visit (INDEPENDENT_AMBULATORY_CARE_PROVIDER_SITE_OTHER): Payer: Medicare Other | Admitting: Pharmacist Clinician (PhC)/ Clinical Pharmacy Specialist

## 2016-08-25 ENCOUNTER — Encounter: Payer: Self-pay | Admitting: Acute Care

## 2016-08-25 ENCOUNTER — Ambulatory Visit (INDEPENDENT_AMBULATORY_CARE_PROVIDER_SITE_OTHER)
Admission: RE | Admit: 2016-08-25 | Discharge: 2016-08-25 | Disposition: A | Discharge status: Home / Self Care | Payer: Medicare Other | Source: Ambulatory Visit | Attending: Acute Care | Admitting: Acute Care

## 2016-08-25 ENCOUNTER — Ambulatory Visit (INDEPENDENT_AMBULATORY_CARE_PROVIDER_SITE_OTHER): Payer: Medicare Other | Admitting: Acute Care

## 2016-08-25 VITALS — BP 110/72 | HR 112 | Ht 73.0 in | Wt 286.2 lb

## 2016-08-25 DIAGNOSIS — J9601 Acute respiratory failure with hypoxia: Secondary | ICD-10-CM

## 2016-08-25 DIAGNOSIS — J189 Pneumonia, unspecified organism: Secondary | ICD-10-CM | POA: Diagnosis not present

## 2016-08-25 DIAGNOSIS — I4891 Unspecified atrial fibrillation: Secondary | ICD-10-CM

## 2016-08-25 DIAGNOSIS — I272 Pulmonary hypertension, unspecified: Secondary | ICD-10-CM

## 2016-08-25 MED ORDER — ALBUTEROL SULFATE (2.5 MG/3ML) 0.083% IN NEBU: 2.5000 mg | INHALATION_SOLUTION | Freq: Four times a day (QID) | RESPIRATORY_TRACT | 4 refills | Status: DC | PRN

## 2016-08-25 NOTE — Assessment & Plan Note (Addendum)
PAH Plan: Continue your Adcirca and [name] as you have been doing. Echo 4/24/as is already scheduled. Follow up in 1 month If patient no better in 1 month, consider bronch Please contact office for sooner follow up if symptoms do not improve or worsen or seek emergency care

## 2016-08-25 NOTE — Progress Notes (Signed)
Reviewed, agree 

## 2016-08-25 NOTE — Assessment & Plan Note (Addendum)
Completed Levaquin post discharge Plan: We will do a CXR today.>> significant improvement since previous film. We will walk you today in the office to ensure your saturations are not dropping below 88%.( Sats dropped to 93%) We will prescribe a nebulizer machine for you. We will prescribe Albuterol  nebs for you. Continue your medications for PAH as you have been doing. Use the nebulizer treatments up to every 6 hours for shortness of breath or wheezing. Follow up appointment in 4 weeks. Please contact office for sooner follow up if symptoms do not improve or worsen or seek emergency care
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
## 2016-08-26 ENCOUNTER — Other Ambulatory Visit: Payer: Self-pay

## 2016-08-26 ENCOUNTER — Encounter: Payer: Self-pay | Admitting: Acute Care

## 2016-08-26 MED ORDER — ALBUTEROL SULFATE (2.5 MG/3ML) 0.083% IN NEBU: 2.5000 mg | INHALATION_SOLUTION | Freq: Four times a day (QID) | RESPIRATORY_TRACT | 12 refills | Status: DC | PRN

## 2016-08-26 NOTE — Telephone Encounter (Signed)
Results have been sent to patient via e-mail. Nothing further needed.   Result Notes  Notes recorded by Magdalen Spatz, NP on 08/25/2016 at 4:29 PM EDT Please call patient and let him know his CXR is improving. Have him follow the plan of care developed in the office today, and follow up in 1 month with CXR prior as noted in the AVS. Please place order for CXR prior to next appointment. Please have patient come In early to the appointment so CXR can be resulted and explained to the patient when he is seen. Thanks so much.

## 2016-08-26 NOTE — Progress Notes (Signed)
Subjective: [name] presents the office today for diabetic foot evaluation as well as for thick, painful, elongated toenails that he cannot trim himself to get irritated in shoes as well as for a corn on the left and right fifth toe. He denies any open sores and denies any swelling or redness or warmth to his foot. He states that overall he is doing well and he has not had any recentissues. He states that the surgery site is doing well. Denies any systemic complaints such as fevers, chills, nausea, vomiting. No acute changes since last appointment, and no other complaints at this time.   Objective: AAO x3, NAD DP/PT pulses palpable bilaterally, CRT less than 3 seconds Protective sensation decreased with Simms Weinstein monofilament Hyperkeratotic lesion present left and right fifth toe. Upon debridement no underlying ulceration, drainage or any signs of infection. Bilateral fourth and fifth digit toenails are dystrophic, discolored, hypertrophic and there is irritation in shoe gear. Upon debridement there is no drainage or pus. No swelling or redness from the toenail sites. No open lesions or pre-ulcerative lesions.  No pain with calf compression, swelling, warmth, erythema  Assessment: Symptomatic onychomycosis 4, hyperkeratotic lesions 1  Plan: -All treatment options discussed with the patient including all alternatives, risks, complications.  -Nails were sharply debrided 4 without complications or bleeding -Hyperkeratotic lesions debrided 2 without complications or bleeding. -Daily foot inspection. -Follow-up in 3 months or sooner if needed. Call any questions or concerns in the meantime.  Adam [name], DPM

## 2016-09-02 ENCOUNTER — Other Ambulatory Visit: Payer: Self-pay

## 2016-09-02 ENCOUNTER — Ambulatory Visit (HOSPITAL_COMMUNITY): Payer: Medicare Other | Attending: Cardiovascular Disease

## 2016-09-02 ENCOUNTER — Telehealth: Payer: Self-pay | Admitting: Cardiology

## 2016-09-02 ENCOUNTER — Telehealth: Payer: Self-pay | Admitting: Endocrinology

## 2016-09-02 DIAGNOSIS — I361 Nonrheumatic tricuspid (valve) insufficiency: Secondary | ICD-10-CM | POA: Diagnosis not present

## 2016-09-02 DIAGNOSIS — I272 Pulmonary hypertension, unspecified: Secondary | ICD-10-CM

## 2016-09-02 DIAGNOSIS — I420 Dilated cardiomyopathy: Secondary | ICD-10-CM | POA: Insufficient documentation

## 2016-09-02 DIAGNOSIS — I342 Nonrheumatic mitral (valve) stenosis: Secondary | ICD-10-CM | POA: Insufficient documentation

## 2016-09-02 DIAGNOSIS — I350 Nonrheumatic aortic (valve) stenosis: Secondary | ICD-10-CM | POA: Diagnosis not present

## 2016-09-02 MED ORDER — MIDODRINE HCL 10 MG PO TABS: 10.0000 mg | ORAL_TABLET | Freq: Three times a day (TID) | ORAL | 6 refills | Status: DC

## 2016-09-02 NOTE — Telephone Encounter (Signed)
Rx request sent to pharmacy.  

## 2016-09-02 NOTE — Telephone Encounter (Signed)
09/02/16 ROI faxed to [phone]. pwr

## 2016-09-03 ENCOUNTER — Encounter: Payer: Self-pay | Admitting: Acute Care

## 2016-09-03 DIAGNOSIS — I2729 Other secondary pulmonary hypertension: Secondary | ICD-10-CM

## 2016-09-08 ENCOUNTER — Encounter: Payer: Self-pay | Admitting: Acute Care

## 2016-09-08 ENCOUNTER — Encounter: Payer: Self-pay | Admitting: Pulmonary Disease

## 2016-09-10 LAB — PROTIME-INR: INR: 2 — AB (ref 0.9–1.1)

## 2016-09-11 ENCOUNTER — Ambulatory Visit (INDEPENDENT_AMBULATORY_CARE_PROVIDER_SITE_OTHER): Payer: Medicare Other | Admitting: Pharmacist Clinician (PhC)/ Clinical Pharmacy Specialist

## 2016-09-11 DIAGNOSIS — I4891 Unspecified atrial fibrillation: Secondary | ICD-10-CM

## 2016-09-15 ENCOUNTER — Telehealth: Payer: Self-pay | Admitting: *Deleted

## 2016-09-15 NOTE — Telephone Encounter (Signed)
Patient came by [facility] office and wanted a 3 month handicap sticker and per Dr Jacqualyn Posey ok to re-do. Lattie Haw

## 2016-09-18 ENCOUNTER — Telehealth: Payer: Self-pay

## 2016-09-18 NOTE — Telephone Encounter (Signed)
Spoke with pt, aware of results/recs.  Nothing further needed.  

## 2016-09-18 NOTE — Telephone Encounter (Signed)
-----   Message from Juanito Doom, MD sent at 09/15/2016 12:27 PM EDT ----- A, Please let him know that his echo didn't show significant worsening, I recommend no changes in therapy right now. Thanks, Ruby Cola

## 2016-09-22 ENCOUNTER — Ambulatory Visit: Payer: Medicare Other | Admitting: Acute Care

## 2016-09-26 ENCOUNTER — Telehealth: Payer: Self-pay | Admitting: Pulmonary Disease

## 2016-09-26 NOTE — Telephone Encounter (Signed)
Spoke with Thomasena Edis from [facility] who states pt informed him that his rx was changed for him to take Adcirca once daily. The last rx I see states Sig: 1 daily on dialysis days, and 2 daily on other days with 11 refills. I do not see in any notes where this was changed. Left a vm for pt to call back to verify.

## 2016-09-29 MED ORDER — TADALAFIL (PAH) 20 MG PO TABS: ORAL_TABLET | ORAL | 11 refills | Status: DC

## 2016-09-29 NOTE — Telephone Encounter (Signed)
OK, refill Adcirca 1 tab daily

## 2016-09-29 NOTE — Telephone Encounter (Signed)
BQ  Please Advise-  Pt states he needs a new rx for his adcirca. He states he now does dialysis every day now. His original rx states  tadalafil, PAH, (ADCIRCA) 20 MG tablet [[id number]]  Order Details  Dose, Route, Frequency: As Directed   Dispense Quantity:  60 tablet Refills:  11 Fills remaining:  --        Sig: 1 daily on dialysis days, and 2 daily on other days

## 2016-09-29 NOTE — Telephone Encounter (Signed)
Updated rx has been escribed to [facility].  Nothing further needed.

## 2016-10-01 ENCOUNTER — Telehealth: Payer: Self-pay | Admitting: Endocrinology

## 2016-10-01 NOTE — Telephone Encounter (Signed)
please call patient: We heard your thyroid level was off.  Please move up next ov to next available.

## 2016-10-09 ENCOUNTER — Ambulatory Visit (INDEPENDENT_AMBULATORY_CARE_PROVIDER_SITE_OTHER): Payer: Medicare Other | Admitting: Endocrinology

## 2016-10-09 ENCOUNTER — Encounter: Payer: Self-pay | Admitting: Endocrinology

## 2016-10-09 DIAGNOSIS — E039 Hypothyroidism, unspecified: Secondary | ICD-10-CM | POA: Insufficient documentation

## 2016-10-09 DIAGNOSIS — E890 Postprocedural hypothyroidism: Secondary | ICD-10-CM

## 2016-10-09 LAB — TSH: TSH: 4.23 u[IU]/mL (ref 0.35–4.50)

## 2016-10-09 NOTE — Patient Instructions (Addendum)
A thyroid blood test is requested for you today.  We'll let you know about the results.   Please come back for a follow-up appointment in 6-12 months, when you will be due for another ultrasound.

## 2016-10-09 NOTE — Progress Notes (Signed)
Subjective:    Patient ID: Adam [name], male    DOB: 03-May-1961, 56 y.o.   MRN: [id number]  HPI  Pt returns for f/u of thyroid cancer, with this chronology: 2011: thyroidectomy in [place]; he does not know cell type; preop US showed 1.5 cm right lob nodule, so stage 1 is presumed Since then, he has had serial labs and ultrasounds.  He says no recurrence has been found.  He has never had RAI, due to hemodialysis.   3/18:US: 0.8 x 1.3 x 0.6 cm hypoechoic mass in the left thyroid bed. 3/18: chest CT: Shotty mediastinal lymph nodes, 4/18: left thyroid bed bx: FEW INFLAMMATORY CELLS.   Denies neck swelling or lump.   Postsurgical hypothyroidism: he denies cramps and numbness and numbness.   Past Medical History:  Diagnosis Date  . AICD (automatic cardioverter/defibrillator) present    Medtronic   . Arthritis    "knees" (08/05/2016)  . Atrial fibrillation ([place])   . ESRD (end stage renal disease) on dialysis ([place])    "M, T, W, T, F; NX stage hemodialysis; I do it at home" (08/05/2016)  . Great toe amputation status ([place])    status post left hallux amputation 08/01/14  . History of blood transfusion 2009   "S/P biopsy for prostate cancer check"  . Hypothyroidism (acquired)   . Nonischemic cardiomyopathy ([place])   . Pneumonia 2014; 08/05/2016  . PONV (postoperative nausea and vomiting)   . Pulmonary HTN ([place])   . Thyroid cancer ([place]) 2011    Past Surgical History:  Procedure Laterality Date  . CARDIAC CATHETERIZATION Right 2017  . CARDIAC DEFIBRILLATOR PLACEMENT  2009  . DIALYSIS FISTULA CREATION Left 2003   "upper arm"  . HERNIA REPAIR    . KIDNEY TRANSPLANT  2009  . KNEE SURGERY Bilateral 2000s   "drained fluid"  . LAPAROSCOPIC GASTRIC BANDING  2006  . PROSTATE BIOPSY  2009  . REVISON OF ARTERIOVENOUS FISTULA Left 9/32/6712   Procedure: PLICATION OF ARTERIOVENOUS FISTULA ANEURYSM;  Surgeon: Elam Dutch, MD;  Location: [facility];  Service: Vascular;  Laterality: Left;  . THROMBECTOMY  W/ EMBOLECTOMY Left 12/25/2014   Procedure: THROMBECTOMY ARTERIOVENOUS FISTULA;  Surgeon: Elam Dutch, MD;  Location: [facility];  Service: Vascular;  Laterality: Left;  . THYROIDECTOMY  2011  . TOE AMPUTATION Bilateral     right 1st and  2nd digits; left 1st and 3rd digits"  . UMBILICAL HERNIA REPAIR  X 2    Social History   Social History  . Marital status: Married    Spouse name: N/A  . Number of children: 1  . Years of education: N/A   Occupational History  . Not on file.   Social History Main Topics  . Smoking status: Never Smoker  . Smokeless tobacco: Never Used  . Alcohol use 0.0 oz/week     Comment: 08/05/2016 "might have 1 shot/month of liquor"  . Drug use: No  . Sexual activity: Yes   Other Topics Concern  . Not on file   Social History Narrative   Retired Archivist.      Current Outpatient Prescriptions on File Prior to Visit  Medication Sig Dispense Refill  . albuterol (PROVENTIL) (2.5 MG/3ML) 0.083% nebulizer solution Take 3 mLs (2.5 mg total) by nebulization every 6 (six) hours as needed for wheezing or shortness of breath. 75 mL 12  . ambrisentan (LETAIRIS) 10 MG tablet Take 1 tablet (10 mg total) by mouth daily. (Patient taking differently:  Take 10 mg by mouth at bedtime. ) 30 tablet 11  . amiodarone (PACERONE) 200 MG tablet Take 200 mg by mouth daily.    Marland Kitchen atorvastatin (LIPITOR) 40 MG tablet TAKE 1 TABLET BY MOUTH DAILY 90 tablet 3  . calcium acetate (PHOSLO) 667 MG capsule Take 1,334 mg by mouth 3 (three) times daily with meals.     . cinacalcet (SENSIPAR) 90 MG tablet Take 90 mg by mouth daily.    Marland Kitchen levothyroxine (SYNTHROID, LEVOTHROID) 300 MCG tablet Take 300 mcg by mouth daily.     . midodrine (PROAMATINE) 10 MG tablet Take 1 tablet (10 mg total) by mouth 3 (three) times daily. 90 tablet 6  . tadalafil, PAH, (ADCIRCA) 20 MG tablet 1 tab daily 30 tablet 11   No current facility-administered medications on file prior to visit.      Allergies  Allergen Reactions  . Enalaprilat Swelling    Mouth swelling  . Ivp Dye [Iodinated Diagnostic Agents] Nausea And Vomiting  . Vasotec [Enalapril] Swelling    Family History  Problem Relation Age of Onset  . Heart failure Mother   . Hypertension Mother   . CAD Mother 46  . Emphysema Mother   . Hypertension Father   . Kidney failure Father     BP 114/62   Pulse 75   Ht 6\' 1"  (1.854 m)   Wt 285 lb (129.3 kg)   SpO2 95%   BMI 37.60 kg/m   Review of Systems Denies neck pain and weight change.     Objective:   Physical Exam VITAL SIGNS:  See vs page GENERAL: no distress Neck: a healed scar is present.  i do not appreciate a nodule in the thyroid or elsewhere in the neck   Lab Results  Component Value Date   TSH 4.23 10/09/2016      Assessment & Plan:  Postsurgical hypothyroidism: well-replaced.  Please continue the same medication Neck nodule: we'll recheck [place] in the future Mediastinal lymphadenopathy, new to me.  We'll plan to recheck in the future.  Patient Instructions  A thyroid blood test is requested for you today.  We'll let you know about the results.   Please come back for a follow-up appointment in 6-12 months, when you will be due for another ultrasound.

## 2016-10-20 ENCOUNTER — Ambulatory Visit (INDEPENDENT_AMBULATORY_CARE_PROVIDER_SITE_OTHER): Payer: Medicare Other | Admitting: *Deleted

## 2016-10-20 DIAGNOSIS — I428 Other cardiomyopathies: Secondary | ICD-10-CM | POA: Diagnosis not present

## 2016-10-20 NOTE — Progress Notes (Signed)
Remote ICD transmission.   

## 2016-10-21 LAB — CUP PACEART REMOTE DEVICE CHECK
Battery Remaining Longevity: 110 mo
Battery Voltage: 3 V
Brady Statistic AP VP Percent: 0.03 %
Brady Statistic AS VS Percent: 98.77 %
Brady Statistic RA Percent Paced: 0.92 %
Brady Statistic RV Percent Paced: 0.34 %
HIGH POWER IMPEDANCE MEASURED VALUE: 50 Ohm
HighPow Impedance: 45 Ohm
Implantable Lead Implant Date: 20090109
Implantable Lead Location: 753859
Implantable Pulse Generator Implant Date: 20160205
Lead Channel Impedance Value: 304 Ohm
Lead Channel Impedance Value: 969 Ohm
Lead Channel Pacing Threshold Pulse Width: 0.4 ms
Lead Channel Sensing Intrinsic Amplitude: 5.75 mV
Lead Channel Sensing Intrinsic Amplitude: 5.75 mV
Lead Channel Setting Pacing Amplitude: 4.5 V
Lead Channel Setting Pacing Pulse Width: 0.4 ms
Lead Channel Setting Sensing Sensitivity: 0.3 mV
MDC IDC LEAD IMPLANT DT: 20090109
MDC IDC LEAD LOCATION: 753860
MDC IDC MSMT LEADCHNL RA IMPEDANCE VALUE: 532 Ohm
MDC IDC MSMT LEADCHNL RA PACING THRESHOLD AMPLITUDE: 2.25 V
MDC IDC MSMT LEADCHNL RA SENSING INTR AMPL: 1.5 mV
MDC IDC MSMT LEADCHNL RA SENSING INTR AMPL: 1.5 mV
MDC IDC MSMT LEADCHNL RV PACING THRESHOLD AMPLITUDE: 2.125 V
MDC IDC MSMT LEADCHNL RV PACING THRESHOLD PULSEWIDTH: 0.4 ms
MDC IDC SESS DTM: 20180611071804
MDC IDC SET LEADCHNL RV PACING AMPLITUDE: 4.75 V
MDC IDC STAT BRADY AP VS PERCENT: 0.9 %
MDC IDC STAT BRADY AS VP PERCENT: 0.3 %

## 2016-10-22 ENCOUNTER — Encounter: Payer: Self-pay | Admitting: Cardiology

## 2016-10-24 LAB — PROTIME-INR: INR: 1.8 — AB (ref <=1.1)

## 2016-10-30 ENCOUNTER — Ambulatory Visit (INDEPENDENT_AMBULATORY_CARE_PROVIDER_SITE_OTHER): Payer: Medicare Other | Admitting: Pharmacist Clinician (PhC)/ Clinical Pharmacy Specialist

## 2016-10-30 ENCOUNTER — Encounter: Payer: Self-pay | Admitting: Cardiovascular Disease

## 2016-10-30 DIAGNOSIS — I4891 Unspecified atrial fibrillation: Secondary | ICD-10-CM

## 2016-11-04 ENCOUNTER — Encounter: Payer: Self-pay | Admitting: Vascular Surgery

## 2016-11-04 ENCOUNTER — Telehealth: Payer: Self-pay | Admitting: Cardiology

## 2016-11-04 ENCOUNTER — Ambulatory Visit (INDEPENDENT_AMBULATORY_CARE_PROVIDER_SITE_OTHER): Payer: Medicare Other | Admitting: Vascular Surgery

## 2016-11-04 ENCOUNTER — Other Ambulatory Visit: Payer: Self-pay

## 2016-11-04 VITALS — BP 99/66 | HR 80 | Temp 97.0°F | Resp 20 | Ht 73.0 in | Wt 274.0 lb

## 2016-11-04 DIAGNOSIS — Z992 Dependence on renal dialysis: Secondary | ICD-10-CM

## 2016-11-04 DIAGNOSIS — N186 End stage renal disease: Secondary | ICD-10-CM | POA: Diagnosis not present

## 2016-11-04 NOTE — Telephone Encounter (Signed)
Will forward to dr hochrein to review and advise

## 2016-11-04 NOTE — Telephone Encounter (Signed)
Will forward this note to [facility]

## 2016-11-04 NOTE — Progress Notes (Signed)
HISTORY AND PHYSICAL     CC:  Scab on fistula Requesting Provider:  [name], MD  HPI: This is a 56 y.o. [name] who underwent thrombectomy of left arm fistula with plication of aneurysm on 12/25/14 for a bleeding episode by Dr. [name].  He states that the fistula was created June 20074 and is 56 years old.    He presents today for a scab that has been on his fistula for ~ 2 weeks.  He states this is in the same place as when he had a bleeding episode a couple of years ago.   He states that he was referred by [facility] for this and concern for prolonged bleeding.  He dialyzes at home for two days on and one day off and is flexible with his schedule. He is on coumadin for afib.  He has a defibrillator on the right side.  He states that he gets pain in his right hip after ambulating for a long period of time.  He had ABI's in August 2017 with 0.79 on the right and 1.47 on the left.  He state he has a knot on the right side.  The pt is on a statin for cholesterol management.  He is on synthroid for hypothyroidism.  He has a hx of thyroid cancer.  He is on coumadin and Amiodarone for afib.   Past Medical History:  Diagnosis Date  . AICD (automatic cardioverter/defibrillator) present    Medtronic   . Arthritis    "knees" (08/05/2016)  . Atrial fibrillation ([place])   . ESRD (end stage renal disease) on dialysis ([place])    "M, T, W, T, F; NX stage hemodialysis; I do it at home" (08/05/2016)  . Great toe amputation status ([place])    status post left hallux amputation 08/01/14  . History of blood transfusion 2009   "S/P biopsy for prostate cancer check"  . Hypothyroidism (acquired)   . Nonischemic cardiomyopathy ([place])   . Pneumonia 2014; 08/05/2016  . PONV (postoperative nausea and vomiting)   . Pulmonary HTN ([place])   . Thyroid cancer ([place]) 2011    Past Surgical History:  Procedure Laterality Date  . CARDIAC CATHETERIZATION Right 2017  . CARDIAC DEFIBRILLATOR PLACEMENT  2009  .  DIALYSIS FISTULA CREATION Left 2003   "upper arm"  . HERNIA REPAIR    . KIDNEY TRANSPLANT  2009  . KNEE SURGERY Bilateral 2000s   "drained fluid"  . LAPAROSCOPIC GASTRIC BANDING  2006  . PROSTATE BIOPSY  2009  . REVISON OF ARTERIOVENOUS FISTULA Left 0/02/2724   Procedure: PLICATION OF ARTERIOVENOUS FISTULA ANEURYSM;  Surgeon: Elam Dutch, MD;  Location: [facility];  Service: Vascular;  Laterality: Left;  . THROMBECTOMY W/ EMBOLECTOMY Left 12/25/2014   Procedure: THROMBECTOMY ARTERIOVENOUS FISTULA;  Surgeon: Elam Dutch, MD;  Location: [facility];  Service: Vascular;  Laterality: Left;  . THYROIDECTOMY  2011  . TOE AMPUTATION Bilateral     right 1st and  2nd digits; left 1st and 3rd digits"  . UMBILICAL HERNIA REPAIR  X 2    Allergies  Allergen Reactions  . Enalaprilat Swelling    Mouth swelling  . Ivp Dye [Iodinated Diagnostic Agents] Nausea And Vomiting  . Vasotec [Enalapril] Swelling    Current Outpatient Prescriptions  Medication Sig Dispense Refill  . albuterol (PROVENTIL) (2.5 MG/3ML) 0.083% nebulizer solution Take 3 mLs (2.5 mg total) by nebulization every 6 (six) hours as needed for wheezing or shortness of breath. 75 mL  12  . ambrisentan (LETAIRIS) 10 MG tablet Take 1 tablet (10 mg total) by mouth daily. (Patient taking differently: Take 10 mg by mouth at bedtime. ) 30 tablet 11  . amiodarone (PACERONE) 200 MG tablet Take 200 mg by mouth daily.    Marland Kitchen atorvastatin (LIPITOR) 40 MG tablet TAKE 1 TABLET BY MOUTH DAILY 90 tablet 3  . calcium acetate (PHOSLO) 667 MG capsule Take 1,334 mg by mouth 3 (three) times daily with meals.     . cinacalcet (SENSIPAR) 90 MG tablet Take 90 mg by mouth daily.    Marland Kitchen levothyroxine (SYNTHROID, LEVOTHROID) 300 MCG tablet Take 300 mcg by mouth daily.     . midodrine (PROAMATINE) 10 MG tablet Take 1 tablet (10 mg total) by mouth 3 (three) times daily. 90 tablet 6  . tadalafil, PAH, (ADCIRCA) 20 MG tablet 1 tab daily 30 tablet 11   No current  facility-administered medications for this visit.     Family History  Problem Relation Age of Onset  . Heart failure Mother   . Hypertension Mother   . CAD Mother 44  . Emphysema Mother   . Hypertension Father   . Kidney failure Father     Social History   Social History  . Marital status: Married    Spouse name: N/A  . Number of children: 1  . Years of education: N/A   Occupational History  . Not on file.   Social History Main Topics  . Smoking status: Never Smoker  . Smokeless tobacco: Never Used  . Alcohol use 0.0 oz/week     Comment: 08/05/2016 "might have 1 shot/month of liquor"  . Drug use: No  . Sexual activity: Yes   Other Topics Concern  . Not on file   Social History Narrative   Retired Archivist.       ROS: [x]  Positive   [ ]  Negative   [ ]  All sytems reviewed and are negative  Cardiac: []  chest pain/pressure []  palpitations []  SOB lying flat []  DOE  Vascular: [x]  pain in right hip while walking a long distance []  pain in feet when lying flat []  hx of DVT []  hx of phlebitis [x]  swelling in legs []  varicose veins [x]  afib  Pulmonary: []  productive cough []  asthma []  wheezing  Neurologic: []  weakness in []  arms []  legs []  numbness in []  arms []  legs [] difficulty speaking or slurred speech []  temporary loss of vision in one eye []  dizziness  Hematologic: []  bleeding problems []  problems with blood clotting easily  GI []  vomiting blood []  blood in stool  GU: [x]  CKD/renal failure  [x]  HD---[x]  HD at home []  M/W/F []  T/T/F [x]  ulceration on fistula [x]  hx failed renal transplant []  burning with urination []  blood in urine  Psychiatric: []  hx of major depression  Integumentary: []  rashes []  ulcers  Constitutional: []  fever []  chills  PHYSICAL EXAMINATION:  Vitals:   11/04/16 1335  Weight: 274 lb (124.3 kg)  Height: 6\' 1"  (1.854 m)   Vitals:   11/04/16 1335  BP: 99/66  Pulse: 80  Resp: 20    Temp: 97 F (36.1 C)     General:  WDWN in NAD Gait: Normal HENT: WNL Pulmonary: normal non-labored breathing , without Rales, rhonchi,  wheezing Cardiac: regular, without  Murmurs, rubs or gallops Abdomen: soft, NT; 1cm solid mobile mass right lateral abdominal area above hip Skin: without rashes Vascular Exam/Pulses:   Right Left  Radial 2+ (normal)  Unable to palpate   Ulnar 1+ (weak) Unable to palpate   Femoral 1+ (weak) 1+ (weak)   Extremities:  +thrill/bruit within fistula; 1cm x 1cm ulceration/scab over distal portion of fistula and is non mobile.  Musculoskeletal: no muscle wasting or atrophy  Neurologic: A&O X 3; Moving all extremities equally;  Speech is fluent/normal  Non-Invasive Vascular Imaging:   None    ASSESSMENT/PLAN: 56 y.o. [name] with ESRD who dialyzes at home 2 days on and 1 day off with ulceration over distal portion of fistula   - the ulceration measures 1cm x 1cm and is non mobile over the fistula.  Dr. Donnetta Hutching has examined the pt and recommends revision of this as he is at increased risk from bleeding similar to the episode a couple of years ago.   -pt is on coumadin for afib-he states he normally just stops this 3-4 days prior to a procedure and is not bridged.   -we will schedule him for revision of his LUA AVF in the near future with either Dr. Donnetta Hutching or Dr. [name]. -pt does have a palpable solid, mobile mass above the right lateral hip area-discussed with pt follow up with his primary care to continue evaluating this.  He expressed understanding and plans to make appointment.    Leontine Locket, PA-C [facility] [phone]  Clinic MD:   Pt seen and examined with Dr. Donnetta Hutching  I have examined the patient, reviewed and agree with above. Does have an adherent eschar in the lower portion of his AV fistula. Have recommended excision of this area to reduce risk of bleeding. We'll schedule this around his dialysis schedule  Curt Jews, MD 11/04/2016 2:22 PM

## 2016-11-04 NOTE — Telephone Encounter (Signed)
No need for bridging.  Based on ACC/AHA guidelines, the patient would be at acceptable risk for the planned procedure without further cardiovascular testing.

## 2016-11-04 NOTE — Telephone Encounter (Signed)
° °  [facility] Pre-operative Risk Assessment    Request for surgical clearance:  1. What type of surgery is being performed? Revision of the left arm artrial venis fistula  2. When is this surgery scheduled? 11/10/2016  3. Are there any medications that need to be held prior to surgery and how long? Coumadin for 4 days before the procedure/ last dose will be 11/05/2016     Do pt need lovanox for bridging?   4. Name of physician performing surgery? [place]  5. What is your office phone and fax number?       [place] 11/04/2016, 2:57 PM  _________________________________________________________________   (provider comments below)

## 2016-11-06 ENCOUNTER — Other Ambulatory Visit: Payer: Self-pay

## 2016-11-07 ENCOUNTER — Encounter (HOSPITAL_COMMUNITY): Payer: Self-pay | Admitting: Vascular Surgery

## 2016-11-07 NOTE — Progress Notes (Signed)
Anesthesia Chart Review: SAME DAY WORK.  Patient is a 56 year old male scheduled for revision of LUA AVF on 11/10/16 by Dr. [name]. Anesthesia is posted for MAC.  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 Wilburn Cornelia notes 09/15/16).  Meds include albuterol, Letairis (pulmonary hypertension), Adcirca (pulmonary hypertension), amiodarone, Lipitor, Phoslo, Sensipar, levothyroxine, midodrine, warfarin (last dose 11/05/16)  - PCP is listed as Dr. Dimas Chyle. - Endocrinologist is Dr. Renato Shin. - Cardiologist is Dr. Minus Breeding (established 05/2014 after moving from [place]). On 11/04/16 he wrote, "No need for bridging.  Based on ACC/AHA guidelines, the patient would be at acceptable risk for the planned procedure without further cardiovascular testing." ICD has been followed by Dr. Sallyanne Kuster. It looks like last interrogation entry is from 10/20/16. According to cardiology notes from [facility] (scanned under Media tab, myocardial biopsy was negative for amyloid '12 and had cardiac radiofrequency ablation '11 and gastric banding '06.  - PV cardiologist is Dr. Quay Burow. - Pulmonologist is Dr. Simonne Maffucci. Last pulmonologist visit 08/25/16 with Bartholomew Crews, NP for follow-up HCAP admission 3/27-3/31/18 s/p treatment with Levaquin. Walk test did not show significant desaturation (only dropped to 93%).  - Nephrologist is Dr. Pearson Grippe. He was declined by [facility] team due to history of hypotension and pulmonary hypertension.   EKG 08/05/16: SR with PACs with abberant conduction. Low  voltage QRS, inferior infarct (age undetermined), cannot rule out anterior infarct (age undetermined), poor r wave progression.  Echo 09/02/16: Study Conclusions - Left ventricle: The cavity size was normal. Wall thickness was   increased in a pattern of mild LVH. Systolic function was normal.   The estimated ejection fraction was in the range of 55% to 60%.   Wall motion was normal; there were no regional wall motion   abnormalities. - Aortic valve: Moderately to severely calcified annulus.   Moderately thickened, moderately calcified leaflets. There was   moderate stenosis. VTI ratio of LVOT to aortic valve: 0.24. Valve area (VTI): 1.06 cm^2. Indexed valve area (VTI): 0.4 cm^2/m^2. Peak velocity ratio of LVOT to aortic valve: 0.23. Valve area (Vmax): 1.06 cm^2. Indexed valve area (Vmax): 0.4 cm^2/m^2. Mean velocity ratio of LVOT to aortic valve: 0.29. Valve area (Vmean): 1.29 cm^2. Indexed valve area (Vmean): 0.49 cm^2/m^2.    Mean gradient (S): 21 mm Hg. Peak gradient (S): 41 mm Hg. - Mitral valve: The findings are consistent with moderate stenosis.  Valve area by pressure half-time: 1.73 cm^2. Indexed valve area by pressure half-time: 0.66 cm^2/m^2. Valve area by continuity equation (using LVOT flow): 1.35 cm^2. Indexed valve area by continuity equation (using LVOT flow): 0.51 cm^2/m^2.    Mean gradient (D): 9 mm Hg. Peak gradient (D): 8 mm Hg. - Left atrium: The atrium was mildly dilated. - Right ventricle: The cavity size was mildly dilated. Systolic   function was mildly reduced. - Right atrium: The atrium was severely dilated. - Tricuspid valve: There was moderate-severe regurgitation. - Pulmonary arteries: Systolic pressure was moderately to severely   increased. PA peak pressure: 65 mm Hg (S).  [place] 08/15/15 ([facility]; Care Everywhere): Component Name Value Ref Range  Cardiac Index (l/min/m2) 3.7 L/min/m2  Right  Atrium Mean Pressure (mmHg) 5 mmHg   Right Ventricle Systolic  Pressure (mmHg) 46 mmHg   Pulmonary Artery Mean Pressure (mmHg) 31 mmHg   Pulmonary Wedge Pressure (mmHg) 11 mmHg   Pulmonary Vascular Resistance (Wood units) 2.2 Wood units    Carotid U/S 05/10/15: Impression: Heterogeneous calcified plaque shadowing, bilaterally. 1-39% right ICA stenosis, higher velocities could be of secured by shadowing. 40-59% left ICA stenosis, higher velocities could be at her by shadowing. 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 Normal subclavian arteries bilaterally. Patent vertebral arteries with antegrade flow.   CXR 08/25/16: IMPRESSION: Mild bilateral interstitial prominence significantly improved compared with 08/05/2016.  He is a same day work-up, so he will get labs and further evaluation on the day of surgery. He does have cardiac clearance. He also has pulmonary hypertension, but is on two medications and not requiring supplemental home O2.  George Hugh [facility] Short Stay Center/Anesthesiology Phone [phone] 11/07/2016 1:18 PM

## 2016-11-07 NOTE — Progress Notes (Signed)
Pt denies any acute cardiopulmonary issues. Pt satted that l;ast dose of Coumadin was Tuesday as instructed. Pt made aware to stop taking vitamins, fish oil and herbal medications. Do not take any NSAIDs ie: Ibuprofen, Advil, Naproxen (Aleve), BC and Goody Powder. Pt verbalized understanding of all pre-op instructions. See anesthesia note.

## 2016-11-09 MED ORDER — DEXTROSE 5 % IV SOLN
1.5000 g | INTRAVENOUS | Status: AC
  Administered 2016-11-10: 1.5 g via INTRAVENOUS
  Filled 2016-11-09: qty 1.5

## 2016-11-10 ENCOUNTER — Encounter (HOSPITAL_COMMUNITY)
Admission: RE | Disposition: A | Discharge status: Home / Self Care | Payer: Self-pay | Source: Ambulatory Visit | Attending: Vascular Surgery

## 2016-11-10 ENCOUNTER — Ambulatory Visit (HOSPITAL_COMMUNITY)
Admission: RE | Admit: 2016-11-10 | Discharge: 2016-11-10 | Disposition: A | Discharge status: Home / Self Care | Payer: Medicare Other | Source: Ambulatory Visit | Attending: Vascular Surgery | Admitting: Vascular Surgery

## 2016-11-10 ENCOUNTER — Ambulatory Visit (HOSPITAL_COMMUNITY): Payer: Medicare Other | Admitting: Vascular Surgery

## 2016-11-10 ENCOUNTER — Encounter (HOSPITAL_COMMUNITY): Payer: Self-pay | Admitting: General Practice

## 2016-11-10 DIAGNOSIS — T82898A Other specified complication of vascular prosthetic devices, implants and grafts, initial encounter: Secondary | ICD-10-CM | POA: Insufficient documentation

## 2016-11-10 DIAGNOSIS — Z8546 Personal history of malignant neoplasm of prostate: Secondary | ICD-10-CM | POA: Insufficient documentation

## 2016-11-10 DIAGNOSIS — Y713 Surgical instruments, materials and cardiovascular devices (including sutures) associated with adverse incidents: Secondary | ICD-10-CM | POA: Diagnosis not present

## 2016-11-10 DIAGNOSIS — Z85850 Personal history of malignant neoplasm of thyroid: Secondary | ICD-10-CM | POA: Diagnosis not present

## 2016-11-10 DIAGNOSIS — Z89412 Acquired absence of left great toe: Secondary | ICD-10-CM | POA: Diagnosis not present

## 2016-11-10 DIAGNOSIS — Z95828 Presence of other vascular implants and grafts: Secondary | ICD-10-CM | POA: Insufficient documentation

## 2016-11-10 DIAGNOSIS — N186 End stage renal disease: Secondary | ICD-10-CM | POA: Diagnosis not present

## 2016-11-10 DIAGNOSIS — Z91041 Radiographic dye allergy status: Secondary | ICD-10-CM | POA: Diagnosis not present

## 2016-11-10 DIAGNOSIS — M170 Bilateral primary osteoarthritis of knee: Secondary | ICD-10-CM | POA: Diagnosis not present

## 2016-11-10 DIAGNOSIS — I429 Cardiomyopathy, unspecified: Secondary | ICD-10-CM | POA: Diagnosis not present

## 2016-11-10 DIAGNOSIS — Z888 Allergy status to other drugs, medicaments and biological substances status: Secondary | ICD-10-CM | POA: Insufficient documentation

## 2016-11-10 DIAGNOSIS — Z79899 Other long term (current) drug therapy: Secondary | ICD-10-CM | POA: Diagnosis not present

## 2016-11-10 DIAGNOSIS — E039 Hypothyroidism, unspecified: Secondary | ICD-10-CM | POA: Insufficient documentation

## 2016-11-10 DIAGNOSIS — Z7901 Long term (current) use of anticoagulants: Secondary | ICD-10-CM | POA: Insufficient documentation

## 2016-11-10 DIAGNOSIS — I272 Pulmonary hypertension, unspecified: Secondary | ICD-10-CM | POA: Insufficient documentation

## 2016-11-10 DIAGNOSIS — Z95810 Presence of automatic (implantable) cardiac defibrillator: Secondary | ICD-10-CM | POA: Insufficient documentation

## 2016-11-10 DIAGNOSIS — Z940 Kidney transplant status: Secondary | ICD-10-CM | POA: Diagnosis not present

## 2016-11-10 DIAGNOSIS — Z8249 Family history of ischemic heart disease and other diseases of the circulatory system: Secondary | ICD-10-CM | POA: Insufficient documentation

## 2016-11-10 DIAGNOSIS — I4891 Unspecified atrial fibrillation: Secondary | ICD-10-CM | POA: Insufficient documentation

## 2016-11-10 DIAGNOSIS — I082 Rheumatic disorders of both aortic and tricuspid valves: Secondary | ICD-10-CM | POA: Insufficient documentation

## 2016-11-10 DIAGNOSIS — Z992 Dependence on renal dialysis: Secondary | ICD-10-CM | POA: Diagnosis not present

## 2016-11-10 HISTORY — PX: REVISON OF ARTERIOVENOUS FISTULA: SHX6074

## 2016-11-10 HISTORY — DX: Nonrheumatic aortic (valve) stenosis: I35.0

## 2016-11-10 HISTORY — DX: Rheumatic mitral stenosis: I05.0

## 2016-11-10 LAB — POCT I-STAT 4, (NA,K, GLUC, HGB,HCT)
GLUCOSE: 74 mg/dL (ref 65–99)
HCT: 33 % — ABNORMAL LOW (ref 39.0–52.0)
Hemoglobin: 11.2 g/dL — ABNORMAL LOW (ref 13.0–17.0)
POTASSIUM: 4.1 mmol/L (ref 3.5–5.1)
SODIUM: 137 mmol/L (ref 135–145)

## 2016-11-10 LAB — PROTIME-INR
INR: 1.43
PROTHROMBIN TIME: 17.6 s — AB (ref 11.4–15.2)

## 2016-11-10 SURGERY — REVISON OF ARTERIOVENOUS FISTULA
Anesthesia: Monitor Anesthesia Care | Site: Arm Upper | Laterality: Left

## 2016-11-10 MED ORDER — LIDOCAINE HCL (PF) 1 % IJ SOLN
INTRAMUSCULAR | Status: DC | PRN
  Administered 2016-11-10: 15 mL

## 2016-11-10 MED ORDER — PHENYLEPHRINE 40 MCG/ML (10ML) SYRINGE FOR IV PUSH (FOR BLOOD PRESSURE SUPPORT)
PREFILLED_SYRINGE | INTRAVENOUS | Status: DC | PRN
Start: 2016-11-10 — End: 2016-11-10
  Administered 2016-11-10: 40 ug via INTRAVENOUS

## 2016-11-10 MED ORDER — PROPOFOL 10 MG/ML IV BOLUS
INTRAVENOUS | Status: AC
  Filled 2016-11-10: qty 20

## 2016-11-10 MED ORDER — CHLORHEXIDINE GLUCONATE CLOTH 2 % EX PADS: 6.0000 | MEDICATED_PAD | Freq: Once | CUTANEOUS | Status: DC

## 2016-11-10 MED ORDER — OXYCODONE-ACETAMINOPHEN 5-325 MG PO TABS: 1.0000 | ORAL_TABLET | Freq: Four times a day (QID) | ORAL | 0 refills | Status: DC | PRN

## 2016-11-10 MED ORDER — FENTANYL CITRATE (PF) 100 MCG/2ML IJ SOLN
INTRAMUSCULAR | Status: DC | PRN
  Administered 2016-11-10 (×2): 50 ug via INTRAVENOUS

## 2016-11-10 MED ORDER — HEPARIN SODIUM (PORCINE) 1000 UNIT/ML IJ SOLN
INTRAMUSCULAR | Status: DC | PRN
  Administered 2016-11-10: 5000 [IU] via INTRAVENOUS

## 2016-11-10 MED ORDER — 0.9 % SODIUM CHLORIDE (POUR BTL) OPTIME
TOPICAL | Status: DC | PRN
  Administered 2016-11-10: 1000 mL

## 2016-11-10 MED ORDER — SODIUM CHLORIDE 0.9 % IV SOLN
INTRAVENOUS | Status: DC
  Administered 2016-11-10: 11:00:00 via INTRAVENOUS

## 2016-11-10 MED ORDER — FENTANYL CITRATE (PF) 250 MCG/5ML IJ SOLN
INTRAMUSCULAR | Status: AC
  Filled 2016-11-10: qty 5

## 2016-11-10 MED ORDER — MIDAZOLAM HCL 2 MG/2ML IJ SOLN
INTRAMUSCULAR | Status: AC
  Filled 2016-11-10: qty 2

## 2016-11-10 MED ORDER — LIDOCAINE HCL (PF) 1 % IJ SOLN
INTRAMUSCULAR | Status: AC
  Filled 2016-11-10: qty 30

## 2016-11-10 MED ORDER — MIDAZOLAM HCL 5 MG/5ML IJ SOLN
INTRAMUSCULAR | Status: DC | PRN
  Administered 2016-11-10: 2 mg via INTRAVENOUS

## 2016-11-10 MED ORDER — SODIUM CHLORIDE 0.9 % IV SOLN
INTRAVENOUS | Status: DC | PRN
  Administered 2016-11-10: 500 mL

## 2016-11-10 SURGICAL SUPPLY — 33 items
ARMBAND PINK RESTRICT EXTREMIT (MISCELLANEOUS) ×2 IMPLANT
CANISTER SUCT 3000ML PPV (MISCELLANEOUS) ×2 IMPLANT
CANNULA VESSEL 3MM 2 BLNT TIP (CANNULA) ×2 IMPLANT
CLIP TI MEDIUM 6 (CLIP) ×2 IMPLANT
CLIP TI WIDE RED SMALL 6 (CLIP) ×2 IMPLANT
COVER PROBE W GEL 5X96 (DRAPES) IMPLANT
DECANTER SPIKE VIAL GLASS SM (MISCELLANEOUS) ×2 IMPLANT
DERMABOND ADVANCED (GAUZE/BANDAGES/DRESSINGS) ×1
DERMABOND ADVANCED .7 DNX12 (GAUZE/BANDAGES/DRESSINGS) ×1 IMPLANT
DRAIN PENROSE 1/4X12 LTX STRL (WOUND CARE) ×2 IMPLANT
ELECT REM PT RETURN 9FT ADLT (ELECTROSURGICAL) ×2
ELECTRODE REM PT RTRN 9FT ADLT (ELECTROSURGICAL) ×1 IMPLANT
GAUZE SPONGE 4X4 12PLY STRL LF (GAUZE/BANDAGES/DRESSINGS) ×2 IMPLANT
GLOVE BIO SURGEON STRL SZ7.5 (GLOVE) ×2 IMPLANT
GOWN STRL REUS W/ TWL LRG LVL3 (GOWN DISPOSABLE) ×3 IMPLANT
GOWN STRL REUS W/TWL LRG LVL3 (GOWN DISPOSABLE) ×3
HEMOSTAT SPONGE AVITENE ULTRA (HEMOSTASIS) IMPLANT
KIT BASIN OR (CUSTOM PROCEDURE TRAY) ×2 IMPLANT
KIT ROOM TURNOVER OR (KITS) ×2 IMPLANT
LOOP VESSEL MINI RED (MISCELLANEOUS) IMPLANT
NS IRRIG 1000ML POUR BTL (IV SOLUTION) ×2 IMPLANT
PACK CV ACCESS (CUSTOM PROCEDURE TRAY) ×2 IMPLANT
PAD ARMBOARD 7.5X6 YLW CONV (MISCELLANEOUS) ×4 IMPLANT
SLEEVE SURGEON STRL (DRAPES) ×2 IMPLANT
SUT PROLENE 5 0 C 1 24 (SUTURE) ×2 IMPLANT
SUT PROLENE 6 0 CC (SUTURE) ×2 IMPLANT
SUT PROLENE 7 0 BV 1 (SUTURE) IMPLANT
SUT VIC AB 3-0 SH 27 (SUTURE) ×1
SUT VIC AB 3-0 SH 27X BRD (SUTURE) ×1 IMPLANT
SUT VICRYL 4-0 PS2 18IN ABS (SUTURE) ×2 IMPLANT
TAPE CLOTH SURG 4X10 WHT LF (GAUZE/BANDAGES/DRESSINGS) ×2 IMPLANT
UNDERPAD 30X30 (UNDERPADS AND DIAPERS) ×2 IMPLANT
WATER STERILE IRR 1000ML POUR (IV SOLUTION) ×2 IMPLANT

## 2016-11-10 NOTE — Anesthesia Preprocedure Evaluation (Signed)
Anesthesia Evaluation  Patient identified by MRN, date of birth, ID band Patient awake    Reviewed: Allergy & Precautions, NPO status , Patient's Chart, lab work & pertinent test results  Airway Mallampati: II  TM Distance: >3 FB Neck ROM: Full    Dental no notable dental hx.    Pulmonary neg pulmonary ROS,    Pulmonary exam normal breath sounds clear to auscultation       Cardiovascular + Cardiac Defibrillator + Valvular Problems/Murmurs AS  Rhythm:Regular Rate:Normal + Systolic murmurs Left ventricle: The cavity size was normal. Wall thickness was   increased in a pattern of mild LVH. Systolic function was normal.   The estimated ejection fraction was in the range of 55% to 60%.   Wall motion was normal; there were no regional wall motion   abnormalities. - Aortic valve: Moderately to severely calcified annulus.   Moderately thickened, moderately calcified leaflets. There was   moderate stenosis. - Mitral valve: The findings are consistent with moderate stenosis. - Left atrium: The atrium was mildly dilated. - Right ventricle: The cavity size was mildly dilated. Systolic   function was mildly reduced. - Right atrium: The atrium was severely dilated. - Tricuspid valve: There was moderate-severe regurgitation. - Pulmonary arteries: Systolic pressure was moderately to severely   increased. PA peak pressure: 65 mm Hg (S).  ------------------------------------------------------------------- Labs, prior tests, procedures, and surgery: Defibrillator.   Neuro/Psych negative neurological ROS  negative psych ROS   GI/Hepatic negative GI ROS, Neg liver ROS,   Endo/Other  Hypothyroidism   Renal/GU DialysisRenal disease  negative genitourinary   Musculoskeletal negative musculoskeletal ROS (+)   Abdominal   Peds negative pediatric ROS (+)  Hematology negative hematology ROS (+)   Anesthesia Other Findings   Reproductive/Obstetrics negative OB ROS                             Anesthesia Physical Anesthesia Plan  ASA: IV  Anesthesia Plan: MAC   Post-op Pain Management:    Induction: Intravenous  PONV Risk Score and Plan: 1 and Ondansetron and Treatment may vary due to age or medical condition  Airway Management Planned: Simple Face Mask  Additional Equipment:   Intra-op Plan:   Post-operative Plan:   Informed Consent: I have reviewed the patients History and Physical, chart, labs and discussed the procedure including the risks, benefits and alternatives for the proposed anesthesia with the patient or authorized representative who has indicated his/her understanding and acceptance.   Dental advisory given  Plan Discussed with: CRNA and Surgeon  Anesthesia Plan Comments:         Anesthesia Quick Evaluation

## 2016-11-10 NOTE — Op Note (Signed)
Procedure: Plication of left upper extremity AV fistula  Preoperative diagnosis: Aneurysmal degeneration left arm AV fistula   Postoperative diagnosis: Same  Anesthesia: Gen.  Assistant: Nurse  Operative findings: #1 plication of proximal 3 cm of left upper extremity AV fistula           #2 Debridement of intravascular stent  Operative details: After obtaining informed consent, the patient was taken to the operating room. The patient was placed in supine position on the operating room table. After adequate sedation, the entire left upper extremity was prepped and draped in usual sterile fashion. A longitudinal incision was made over the proximal portion of the fistula incorporating an area about 3 cm in length of aneurysmal degeneration. The fistula was dissected free circumferentially throughout its course. The patient was given 5000 units of intravenous heparin. The fistula was clamped proximally and distally above and below the aneurysms. The aneurysm was opened longitudinally.  At this point a metallic stent was encountered which extended proximally and distally beyond the area of vascular control.  The stent struts were debrided proximally and distally as far as practical making sure that there were no loose struts and that only the endotheliazed segment was left behind. At this point the redundant segment of fistula was excised. The area of resection was then reapproximated using a running 5-0 Prolene suture. The clamps were removed and flow restored to the fistula. Hemostasis was obtained. Some of the redundant skin was then resected. The subcutaneous tissues were reapproximated using a running 3-0 Vicryl suture. The skin was closed with a 4 0 Vicryl subcuticular stitch. Dermabond was applied to the skin incision.  The patient tolerated procedure well and there were no complications. Instrument sponge and needle counts were correct at the end of the case. The patient was taken to the recovery  room in stable condition.   IF PATIENT REQUIRES FURTHER REVISION OF FISTULA AN XRAY OF THE ARM SHOULD BE OBTAINED IN ORDER TO DETERMINE EXACT LENGTH AND LOCATION OF ALL STENT MATERIAL IN THIS LEFT ARM AV FISTULA WHICH APPARENTLY HAS HAD STENTING ELSEWHERE.  [name], MD [facility] [place] Office: [phone] Pager: [phone]

## 2016-11-10 NOTE — Transfer of Care (Signed)
Immediate Anesthesia Transfer of Care Note  Patient: Adam [name].  Procedure(s) Performed: Procedure(s): REVISON OF ARTERIOVENOUS FISTULA - LEFT UPPER ARM (Left)  Patient Location: PACU  Anesthesia Type:MAC  Level of Consciousness: awake, alert , oriented and patient cooperative  Airway & Oxygen Therapy: Patient Spontanous Breathing  Post-op Assessment: Report given to RN and Post -op Vital signs reviewed and stable  Post vital signs: Reviewed and stable  Last Vitals:  Vitals:   11/10/16 1024  BP: 117/80  Pulse: 89  Resp: 18  Temp: 36.4 C    Last Pain:  Vitals:   11/10/16 1024  TempSrc: Oral      Patients Stated Pain Goal: 2 (43/88/87 5797)  Complications: No apparent anesthesia complications

## 2016-11-10 NOTE — H&P (View-Only) (Signed)
HISTORY AND PHYSICAL     CC:  Scab on fistula Requesting Provider:  [name], MD  HPI: This is a 56 y.o. male who underwent thrombectomy of left arm fistula with plication of aneurysm on 12/25/14 for a bleeding episode by Dr. [name].  He states that the fistula was created June 20021 and is 56 years old.    He presents today for a scab that has been on his fistula for ~ 2 weeks.  He states this is in the same place as when he had a bleeding episode a couple of years ago.   He states that he was referred by [facility] for this and concern for prolonged bleeding.  He dialyzes at home for two days on and one day off and is flexible with his schedule. He is on coumadin for afib.  He has a defibrillator on the right side.  He states that he gets pain in his right hip after ambulating for a long period of time.  He had ABI's in August 2017 with 0.79 on the right and 1.47 on the left.  He state he has a knot on the right side.  The pt is on a statin for cholesterol management.  He is on synthroid for hypothyroidism.  He has a hx of thyroid cancer.  He is on coumadin and Amiodarone for afib.   Past Medical History:  Diagnosis Date  . AICD (automatic cardioverter/defibrillator) present    Medtronic   . Arthritis    "knees" (08/05/2016)  . Atrial fibrillation ([place])   . ESRD (end stage renal disease) on dialysis ([place])    "M, T, W, T, F; NX stage hemodialysis; I do it at home" (08/05/2016)  . Great toe amputation status ([place])    status post left hallux amputation 08/01/14  . History of blood transfusion 2009   "S/P biopsy for prostate cancer check"  . Hypothyroidism (acquired)   . Nonischemic cardiomyopathy ([place])   . Pneumonia 2014; 08/05/2016  . PONV (postoperative nausea and vomiting)   . Pulmonary HTN ([place])   . Thyroid cancer ([place]) 2011    Past Surgical History:  Procedure Laterality Date  . CARDIAC CATHETERIZATION Right 2017  . CARDIAC DEFIBRILLATOR PLACEMENT  2009  .  DIALYSIS FISTULA CREATION Left 2003   "upper arm"  . HERNIA REPAIR    . KIDNEY TRANSPLANT  2009  . KNEE SURGERY Bilateral 2000s   "drained fluid"  . LAPAROSCOPIC GASTRIC BANDING  2006  . PROSTATE BIOPSY  2009  . REVISON OF ARTERIOVENOUS FISTULA Left 0/35/0093   Procedure: PLICATION OF ARTERIOVENOUS FISTULA ANEURYSM;  Surgeon: Elam Dutch, MD;  Location: [facility];  Service: Vascular;  Laterality: Left;  . THROMBECTOMY W/ EMBOLECTOMY Left 12/25/2014   Procedure: THROMBECTOMY ARTERIOVENOUS FISTULA;  Surgeon: Elam Dutch, MD;  Location: [facility];  Service: Vascular;  Laterality: Left;  . THYROIDECTOMY  2011  . TOE AMPUTATION Bilateral     right 1st and  2nd digits; left 1st and 3rd digits"  . UMBILICAL HERNIA REPAIR  X 2    Allergies  Allergen Reactions  . Enalaprilat Swelling    Mouth swelling  . Ivp Dye [Iodinated Diagnostic Agents] Nausea And Vomiting  . Vasotec [Enalapril] Swelling    Current Outpatient Prescriptions  Medication Sig Dispense Refill  . albuterol (PROVENTIL) (2.5 MG/3ML) 0.083% nebulizer solution Take 3 mLs (2.5 mg total) by nebulization every 6 (six) hours as needed for wheezing or shortness of breath. 75 mL  12  . ambrisentan (LETAIRIS) 10 MG tablet Take 1 tablet (10 mg total) by mouth daily. (Patient taking differently: Take 10 mg by mouth at bedtime. ) 30 tablet 11  . amiodarone (PACERONE) 200 MG tablet Take 200 mg by mouth daily.    Marland Kitchen atorvastatin (LIPITOR) 40 MG tablet TAKE 1 TABLET BY MOUTH DAILY 90 tablet 3  . calcium acetate (PHOSLO) 667 MG capsule Take 1,334 mg by mouth 3 (three) times daily with meals.     . cinacalcet (SENSIPAR) 90 MG tablet Take 90 mg by mouth daily.    Marland Kitchen levothyroxine (SYNTHROID, LEVOTHROID) 300 MCG tablet Take 300 mcg by mouth daily.     . midodrine (PROAMATINE) 10 MG tablet Take 1 tablet (10 mg total) by mouth 3 (three) times daily. 90 tablet 6  . tadalafil, PAH, (ADCIRCA) 20 MG tablet 1 tab daily 30 tablet 11   No current  facility-administered medications for this visit.     Family History  Problem Relation Age of Onset  . Heart failure Mother   . Hypertension Mother   . CAD Mother 22  . Emphysema Mother   . Hypertension Father   . Kidney failure Father     Social History   Social History  . Marital status: Married    Spouse name: N/A  . Number of children: 1  . Years of education: N/A   Occupational History  . Not on file.   Social History Main Topics  . Smoking status: Never Smoker  . Smokeless tobacco: Never Used  . Alcohol use 0.0 oz/week     Comment: 08/05/2016 "might have 1 shot/month of liquor"  . Drug use: No  . Sexual activity: Yes   Other Topics Concern  . Not on file   Social History Narrative   Retired Archivist.       ROS: [x]  Positive   [ ]  Negative   [ ]  All sytems reviewed and are negative  Cardiac: []  chest pain/pressure []  palpitations []  SOB lying flat []  DOE  Vascular: [x]  pain in right hip while walking a long distance []  pain in feet when lying flat []  hx of DVT []  hx of phlebitis [x]  swelling in legs []  varicose veins [x]  afib  Pulmonary: []  productive cough []  asthma []  wheezing  Neurologic: []  weakness in []  arms []  legs []  numbness in []  arms []  legs [] difficulty speaking or slurred speech []  temporary loss of vision in one eye []  dizziness  Hematologic: []  bleeding problems []  problems with blood clotting easily  GI []  vomiting blood []  blood in stool  GU: [x]  CKD/renal failure  [x]  HD---[x]  HD at home []  M/W/F []  T/T/F [x]  ulceration on fistula [x]  hx failed renal transplant []  burning with urination []  blood in urine  Psychiatric: []  hx of major depression  Integumentary: []  rashes []  ulcers  Constitutional: []  fever []  chills  PHYSICAL EXAMINATION:  Vitals:   11/04/16 1335  Weight: 274 lb (124.3 kg)  Height: 6\' 1"  (1.854 m)   Vitals:   11/04/16 1335  BP: 99/66  Pulse: 80  Resp: 20    Temp: 97 F (36.1 C)     General:  WDWN in NAD Gait: Normal HENT: WNL Pulmonary: normal non-labored breathing , without Rales, rhonchi,  wheezing Cardiac: regular, without  Murmurs, rubs or gallops Abdomen: soft, NT; 1cm solid mobile mass right lateral abdominal area above hip Skin: without rashes Vascular Exam/Pulses:   Right Left  Radial 2+ (normal)  Unable to palpate   Ulnar 1+ (weak) Unable to palpate   Femoral 1+ (weak) 1+ (weak)   Extremities:  +thrill/bruit within fistula; 1cm x 1cm ulceration/scab over distal portion of fistula and is non mobile.  Musculoskeletal: no muscle wasting or atrophy  Neurologic: A&O X 3; Moving all extremities equally;  Speech is fluent/normal  Non-Invasive Vascular Imaging:   None    ASSESSMENT/PLAN: 56 y.o. male with ESRD who dialyzes at home 2 days on and 1 day off with ulceration over distal portion of fistula   - the ulceration measures 1cm x 1cm and is non mobile over the fistula.  Dr. Donnetta Hutching has examined the pt and recommends revision of this as he is at increased risk from bleeding similar to the episode a couple of years ago.   -pt is on coumadin for afib-he states he normally just stops this 3-4 days prior to a procedure and is not bridged.   -we will schedule him for revision of his LUA AVF in the near future with either Dr. Donnetta Hutching or Dr. [name]. -pt does have a palpable solid, mobile mass above the right lateral hip area-discussed with pt follow up with his primary care to continue evaluating this.  He expressed understanding and plans to make appointment.    Leontine Locket, PA-C [facility] [phone]  Clinic MD:   Pt seen and examined with Dr. Donnetta Hutching  I have examined the patient, reviewed and agree with above. Does have an adherent eschar in the lower portion of his AV fistula. Have recommended excision of this area to reduce risk of bleeding. We'll schedule this around his dialysis schedule  Curt Jews, MD 11/04/2016 2:22 PM

## 2016-11-10 NOTE — Interval H&P Note (Signed)
History and Physical Interval Note:  11/10/2016 11:44 AM  Adam [name].  has presented today for surgery, with the diagnosis of End Stage Renal Disease N18.6   The various methods of treatment have been discussed with the patient and family. After consideration of risks, benefits and other options for treatment, the patient has consented to  Procedure(s): REVISON OF ARTERIOVENOUS FISTULA - LEFT UPPER ARM (Left) as a surgical intervention .  The patient's history has been reviewed, patient examined, no change in status, stable for surgery.  I have reviewed the patient's chart and labs.  Questions were answered to the patient's satisfaction.     [name]

## 2016-11-10 NOTE — Anesthesia Postprocedure Evaluation (Signed)
Anesthesia Post Note  Patient: Adam [name].  Procedure(s) Performed: Procedure(s) (LRB): REVISON OF ARTERIOVENOUS FISTULA - LEFT UPPER ARM (Left)     Patient location during evaluation: PACU Anesthesia Type: MAC Level of consciousness: awake and alert Pain management: pain level controlled Vital Signs Assessment: post-procedure vital signs reviewed and stable Respiratory status: spontaneous breathing, nonlabored ventilation, respiratory function stable and patient connected to nasal cannula oxygen Cardiovascular status: stable and blood pressure returned to baseline Anesthetic complications: no    Last Vitals:  Vitals:   11/10/16 1345 11/10/16 1349  BP:  94/70  Pulse: 71 68  Resp: 13 10  Temp:      Last Pain:  Vitals:   11/10/16 1024  TempSrc: Oral                 Tauriel Scronce S

## 2016-11-11 ENCOUNTER — Ambulatory Visit (HOSPITAL_COMMUNITY)
Admission: RE | Admit: 2016-11-11 | Discharge: 2016-11-11 | Disposition: A | Discharge status: Home / Self Care | Payer: Medicare Other | Source: Ambulatory Visit | Attending: Vascular Surgery | Admitting: Vascular Surgery

## 2016-11-11 ENCOUNTER — Encounter (HOSPITAL_COMMUNITY)
Admission: RE | Disposition: A | Discharge status: Home / Self Care | Payer: Self-pay | Source: Ambulatory Visit | Attending: Vascular Surgery

## 2016-11-11 ENCOUNTER — Ambulatory Visit (HOSPITAL_COMMUNITY): Payer: Medicare Other | Admitting: Certified Registered"

## 2016-11-11 ENCOUNTER — Ambulatory Visit (HOSPITAL_COMMUNITY): Payer: Medicare Other

## 2016-11-11 ENCOUNTER — Encounter (HOSPITAL_COMMUNITY): Payer: Self-pay | Admitting: Vascular Surgery

## 2016-11-11 ENCOUNTER — Telehealth: Payer: Self-pay | Admitting: Vascular Surgery

## 2016-11-11 ENCOUNTER — Other Ambulatory Visit: Payer: Self-pay

## 2016-11-11 DIAGNOSIS — E039 Hypothyroidism, unspecified: Secondary | ICD-10-CM | POA: Diagnosis not present

## 2016-11-11 DIAGNOSIS — Z940 Kidney transplant status: Secondary | ICD-10-CM | POA: Insufficient documentation

## 2016-11-11 DIAGNOSIS — Z85850 Personal history of malignant neoplasm of thyroid: Secondary | ICD-10-CM | POA: Insufficient documentation

## 2016-11-11 DIAGNOSIS — Z95810 Presence of automatic (implantable) cardiac defibrillator: Secondary | ICD-10-CM | POA: Diagnosis not present

## 2016-11-11 DIAGNOSIS — Z992 Dependence on renal dialysis: Secondary | ICD-10-CM

## 2016-11-11 DIAGNOSIS — N185 Chronic kidney disease, stage 5: Secondary | ICD-10-CM | POA: Diagnosis not present

## 2016-11-11 DIAGNOSIS — N186 End stage renal disease: Secondary | ICD-10-CM | POA: Diagnosis not present

## 2016-11-11 DIAGNOSIS — Z419 Encounter for procedure for purposes other than remedying health state, unspecified: Secondary | ICD-10-CM

## 2016-11-11 DIAGNOSIS — Z452 Encounter for adjustment and management of vascular access device: Secondary | ICD-10-CM | POA: Diagnosis present

## 2016-11-11 DIAGNOSIS — Z95828 Presence of other vascular implants and grafts: Secondary | ICD-10-CM

## 2016-11-11 DIAGNOSIS — I4891 Unspecified atrial fibrillation: Secondary | ICD-10-CM | POA: Insufficient documentation

## 2016-11-11 HISTORY — PX: INSERTION OF DIALYSIS CATHETER: SHX1324

## 2016-11-11 LAB — POCT I-STAT 4, (NA,K, GLUC, HGB,HCT)
Glucose, Bld: 71 mg/dL (ref 65–99)
HEMATOCRIT: 33 % — AB (ref 39.0–52.0)
Hemoglobin: 11.2 g/dL — ABNORMAL LOW (ref 13.0–17.0)
Potassium: 4.1 mmol/L (ref 3.5–5.1)
SODIUM: 137 mmol/L (ref 135–145)

## 2016-11-11 LAB — PROTIME-INR
INR: 1.39
PROTHROMBIN TIME: 17.2 s — AB (ref 11.4–15.2)

## 2016-11-11 SURGERY — INSERTION OF DIALYSIS CATHETER
Anesthesia: Monitor Anesthesia Care | Site: Neck

## 2016-11-11 MED ORDER — IOPAMIDOL (ISOVUE-300) INJECTION 61%
INTRAVENOUS | Status: AC
  Filled 2016-11-11: qty 50

## 2016-11-11 MED ORDER — PHENYLEPHRINE HCL 10 MG/ML IJ SOLN
INTRAMUSCULAR | Status: DC | PRN
  Administered 2016-11-11: 80 ug via INTRAVENOUS

## 2016-11-11 MED ORDER — LIDOCAINE-EPINEPHRINE (PF) 1 %-1:200000 IJ SOLN
INTRAMUSCULAR | Status: DC | PRN
  Administered 2016-11-11: 25 mL

## 2016-11-11 MED ORDER — DEXTROSE 5 % IV SOLN
INTRAVENOUS | Status: AC
  Filled 2016-11-11: qty 1.5

## 2016-11-11 MED ORDER — MIDAZOLAM HCL 2 MG/2ML IJ SOLN
INTRAMUSCULAR | Status: AC
  Filled 2016-11-11: qty 2

## 2016-11-11 MED ORDER — PROMETHAZINE HCL 25 MG/ML IJ SOLN: 6.2500 mg | INTRAMUSCULAR | Status: DC | PRN

## 2016-11-11 MED ORDER — SODIUM CHLORIDE 0.9 % IV SOLN: INTRAVENOUS | Status: DC

## 2016-11-11 MED ORDER — FENTANYL CITRATE (PF) 250 MCG/5ML IJ SOLN
INTRAMUSCULAR | Status: AC
  Filled 2016-11-11: qty 5

## 2016-11-11 MED ORDER — ONDANSETRON HCL 4 MG/2ML IJ SOLN
INTRAMUSCULAR | Status: AC
  Filled 2016-11-11: qty 2

## 2016-11-11 MED ORDER — LIDOCAINE HCL (CARDIAC) 20 MG/ML IV SOLN
INTRAVENOUS | Status: AC
  Filled 2016-11-11: qty 5

## 2016-11-11 MED ORDER — DEXTROSE 5 % IV SOLN
1.5000 g | INTRAVENOUS | Status: AC
  Administered 2016-11-11: 1.5 g via INTRAVENOUS

## 2016-11-11 MED ORDER — MIDAZOLAM HCL 5 MG/5ML IJ SOLN
INTRAMUSCULAR | Status: DC | PRN
  Administered 2016-11-11: 2 mg via INTRAVENOUS

## 2016-11-11 MED ORDER — LIDOCAINE-EPINEPHRINE (PF) 1 %-1:200000 IJ SOLN
INTRAMUSCULAR | Status: AC
  Filled 2016-11-11: qty 30

## 2016-11-11 MED ORDER — HEPARIN SODIUM (PORCINE) 1000 UNIT/ML IJ SOLN
INTRAMUSCULAR | Status: AC
  Filled 2016-11-11: qty 1

## 2016-11-11 MED ORDER — HEPARIN SODIUM (PORCINE) 1000 UNIT/ML IJ SOLN
INTRAMUSCULAR | Status: DC | PRN
  Administered 2016-11-11: 3800 [IU] via INTRA_ARTERIAL

## 2016-11-11 MED ORDER — FENTANYL CITRATE (PF) 100 MCG/2ML IJ SOLN
INTRAMUSCULAR | Status: DC | PRN
  Administered 2016-11-11: 25 ug via INTRAVENOUS

## 2016-11-11 MED ORDER — FENTANYL CITRATE (PF) 100 MCG/2ML IJ SOLN: 25.0000 ug | INTRAMUSCULAR | Status: DC | PRN

## 2016-11-11 MED ORDER — PROPOFOL 500 MG/50ML IV EMUL
INTRAVENOUS | Status: DC | PRN
  Administered 2016-11-11: 50 ug/kg/min via INTRAVENOUS

## 2016-11-11 MED ORDER — SODIUM CHLORIDE 0.9 % IV SOLN
INTRAVENOUS | Status: DC | PRN
  Administered 2016-11-11: 14:00:00 via INTRAVENOUS

## 2016-11-11 MED ORDER — OXYCODONE-ACETAMINOPHEN 5-325 MG PO TABS: 1.0000 | ORAL_TABLET | ORAL | 0 refills | Status: DC | PRN

## 2016-11-11 MED ORDER — SODIUM CHLORIDE 0.9 % IV SOLN
INTRAVENOUS | Status: DC | PRN
  Administered 2016-11-11: 15:00:00

## 2016-11-11 MED ORDER — 0.9 % SODIUM CHLORIDE (POUR BTL) OPTIME
TOPICAL | Status: DC | PRN
  Administered 2016-11-11: 1000 mL

## 2016-11-11 MED ORDER — CHLORHEXIDINE GLUCONATE CLOTH 2 % EX PADS: 6.0000 | MEDICATED_PAD | Freq: Once | CUTANEOUS | Status: DC

## 2016-11-11 MED ORDER — ONDANSETRON HCL 4 MG/2ML IJ SOLN
INTRAMUSCULAR | Status: DC | PRN
  Administered 2016-11-11: 4 mg via INTRAVENOUS

## 2016-11-11 MED ORDER — LIDOCAINE 2% (20 MG/ML) 5 ML SYRINGE
INTRAMUSCULAR | Status: DC | PRN
  Administered 2016-11-11: 50 mg via INTRAVENOUS

## 2016-11-11 SURGICAL SUPPLY — 39 items
BAG DECANTER FOR FLEXI CONT (MISCELLANEOUS) ×2 IMPLANT
BIOPATCH RED 1 DISK 7.0 (GAUZE/BANDAGES/DRESSINGS) ×2 IMPLANT
CATH PALINDROME RT-P 15FX19CM (CATHETERS) IMPLANT
CATH PALINDROME RT-P 15FX23CM (CATHETERS) ×2 IMPLANT
CATH PALINDROME RT-P 15FX28CM (CATHETERS) IMPLANT
CATH PALINDROME RT-P 15FX55CM (CATHETERS) IMPLANT
CHLORAPREP W/TINT 26ML (MISCELLANEOUS) ×2 IMPLANT
COVER PROBE W GEL 5X96 (DRAPES) IMPLANT
COVER SURGICAL LIGHT HANDLE (MISCELLANEOUS) ×2 IMPLANT
DECANTER SPIKE VIAL GLASS SM (MISCELLANEOUS) ×2 IMPLANT
DERMABOND ADVANCED (GAUZE/BANDAGES/DRESSINGS) ×1
DERMABOND ADVANCED .7 DNX12 (GAUZE/BANDAGES/DRESSINGS) ×1 IMPLANT
DRAPE C-ARM 42X72 X-RAY (DRAPES) ×2 IMPLANT
DRAPE CHEST BREAST 15X10 FENES (DRAPES) ×2 IMPLANT
GAUZE SPONGE 4X4 12PLY STRL LF (GAUZE/BANDAGES/DRESSINGS) ×2 IMPLANT
GAUZE SPONGE 4X4 16PLY XRAY LF (GAUZE/BANDAGES/DRESSINGS) ×2 IMPLANT
GLOVE BIO SURGEON STRL SZ7.5 (GLOVE) ×2 IMPLANT
GLOVE BIOGEL PI IND STRL 8 (GLOVE) ×1 IMPLANT
GLOVE BIOGEL PI INDICATOR 8 (GLOVE) ×1
GOWN STRL REUS W/ TWL LRG LVL3 (GOWN DISPOSABLE) ×2 IMPLANT
GOWN STRL REUS W/TWL LRG LVL3 (GOWN DISPOSABLE) ×2
KIT BASIN OR (CUSTOM PROCEDURE TRAY) ×2 IMPLANT
KIT ROOM TURNOVER OR (KITS) ×2 IMPLANT
NEEDLE 18GX1X1/2 (RX/OR ONLY) (NEEDLE) ×2 IMPLANT
NEEDLE 22X1 1/2 (OR ONLY) (NEEDLE) ×2 IMPLANT
NEEDLE HYPO 25GX1X1/2 BEV (NEEDLE) ×2 IMPLANT
NS IRRIG 1000ML POUR BTL (IV SOLUTION) ×2 IMPLANT
PACK SURGICAL SETUP 50X90 (CUSTOM PROCEDURE TRAY) ×2 IMPLANT
PAD ARMBOARD 7.5X6 YLW CONV (MISCELLANEOUS) ×4 IMPLANT
SUT ETHILON 3 0 PS 1 (SUTURE) ×2 IMPLANT
SUT VICRYL 4-0 PS2 18IN ABS (SUTURE) ×2 IMPLANT
SYR 10ML LL (SYRINGE) ×2 IMPLANT
SYR 20CC LL (SYRINGE) ×4 IMPLANT
SYR 5ML LL (SYRINGE) ×6 IMPLANT
SYR CONTROL 10ML LL (SYRINGE) ×2 IMPLANT
TAPE CLOTH SURG 4X10 WHT LF (GAUZE/BANDAGES/DRESSINGS) ×2 IMPLANT
TOWEL OR 17X24 6PK STRL BLUE (TOWEL DISPOSABLE) ×2 IMPLANT
TOWEL OR 17X26 10 PK STRL BLUE (TOWEL DISPOSABLE) ×2 IMPLANT
WATER STERILE IRR 1000ML POUR (IV SOLUTION) ×2 IMPLANT

## 2016-11-11 NOTE — Anesthesia Procedure Notes (Signed)
Procedure Name: MAC Date/Time: 11/11/2016 2:57 PM Performed by: Orlie Dakin Pre-anesthesia Checklist: Patient identified, Emergency Drugs available, Suction available and Patient being monitored Oxygen Delivery Method: Simple face mask

## 2016-11-11 NOTE — Anesthesia Preprocedure Evaluation (Addendum)
Anesthesia Evaluation  Patient identified by MRN, date of birth, ID band Patient awake    Reviewed: Allergy & Precautions, NPO status , Patient's Chart, lab work & pertinent test results  History of Anesthesia Complications (+) PONVNegative for: history of anesthetic complications  Airway Mallampati: II  TM Distance: >3 FB Neck ROM: Full    Dental  (+) Edentulous Upper   Pulmonary neg pulmonary ROS,    breath sounds clear to auscultation       Cardiovascular + Peripheral Vascular Disease  + Cardiac Defibrillator + Valvular Problems/Murmurs MR and AS  Rhythm:Regular + Systolic murmurs    Neuro/Psych negative neurological ROS  negative psych ROS   GI/Hepatic Neg liver ROS,   Endo/Other  Hypothyroidism   Renal/GU ESRF and DialysisRenal disease     Musculoskeletal  (+) Arthritis ,   Abdominal   Peds  Hematology   Anesthesia Other Findings 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 Wilburn Cornelia notes 09/15/16).  Meds include albuterol, Letairis (pulmonary hypertension), Adcirca (pulmonary hypertension), amiodarone, Lipitor, Phoslo, Sensipar, levothyroxine, midodrine, warfarin (last dose 11/05/16)  - PCP is listed as Dr. Dimas Chyle. - Endocrinologist is Dr. Renato Shin. - Cardiologist is Dr. Minus Breeding (established 05/2014 after moving from [place]). On 11/04/16 he wrote, "No need for bridging. Based on ACC/AHA guidelines, the patient would be at acceptable risk for the planned procedure without further cardiovascular  testing." ICD has been followed by Dr. Sallyanne Kuster. It looks like last interrogation entry is from 10/20/16. According to cardiology notes from [facility] (scanned under Media tab, myocardial biopsy was negative for amyloid '12 and had cardiac radiofrequency ablation '11 and gastric banding '06.  - PV cardiologist is Dr. Quay Burow. - Pulmonologist is Dr. Simonne Maffucci. Last pulmonologist visit 08/25/16 with Bartholomew Crews, NP for follow-up HCAP admission 3/27-3/31/18 s/p treatment with Levaquin. Walk test did not show significant desaturation (only dropped to 93%).  - Nephrologist is Dr. Pearson Grippe. He was declined by [facility] team due to history of hypotension and pulmonary hypertension.   EKG 08/05/16: SR with PACs with abberant conduction. Low voltage QRS, inferior infarct (age undetermined), cannot rule out anterior infarct (age undetermined), poor r wave progression.  Echo 09/02/16: Study Conclusions - Left ventricle: The cavity size was normal. Wall thickness was increased in a pattern of mild LVH. Systolic function was normal. The estimated ejection fraction was in the range of 55% to 60%. Wall motion was normal; there were no regional wall motion abnormalities. - Aortic valve: Moderately to severely calcified annulus. Moderately thickened, moderately calcified leaflets. There was moderate stenosis. VTI ratio of LVOT to aortic valve: 0.24. Valve area (VTI): 1.06 cm^2. Indexed valve area (VTI): 0.4 cm^2/m^2. Peak velocity ratio of LVOT to aortic valve: 0.23. Valve area (Vmax): 1.06 cm^2. Indexed valve area (Vmax): 0.4 cm^2/m^2. Mean velocity ratio of LVOT to aortic valve: 0.29. Valve area (Vmean): 1.29 cm^2. Indexed valve area (Vmean): 0.49 cm^2/m^2. Mean gradient (S): 21 mm Hg. Peak gradient (S): 41 mm Hg. - Mitral valve: The findings are consistent with moderate stenosis.  Valve area by pressure half-time: 1.73 cm^2. Indexed valve area by pressure half-time: 0.66  cm^2/m^2. Valve area by continuity equation (  using LVOT flow): 1.35 cm^2. Indexed valve area by continuity equation (using LVOT flow): 0.51 cm^2/m^2. Mean gradient (D): 9 mm Hg. Peak gradient (D): 8 mm Hg. - Left atrium: The atrium was mildly dilated. - Right ventricle: The cavity size was mildly dilated. Systolic function was mildly reduced. - Right atrium: The atrium was severely dilated. - Tricuspid valve: There was moderate-severe regurgitation. - Pulmonary arteries: Systolic pressure was moderately to severely increased. PA peak pressure: 65 mm Hg (S).  [place] 08/15/15 ([place]; Care Everywhere): Component Name Value Ref Range Cardiac Index (l/min/m2) 3.7 L/min/m2 Right Atrium Mean Pressure (mmHg) 5 mmHg  Right Ventricle Systolic Pressure (mmHg) 46 mmHg  Pulmonary Artery Mean Pressure (mmHg) 31 mmHg  Pulmonary Wedge Pressure (mmHg) 11 mmHg  Pulmonary Vascular Resistance (Wood units) 2.2 Wood units   Carotid U/S 05/10/15: Impression: Heterogeneous calcified plaque shadowing, bilaterally. 1-39% right ICA stenosis, higher velocities could be of secured by shadowing. 40-59% left ICA stenosis, higher velocities could be at her by shadowing. 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 Normal subclavian arteries bilaterally. Patent vertebral arteries with antegrade flow.     Reproductive/Obstetrics                            Anesthesia Physical Anesthesia Plan  ASA: III  Anesthesia Plan: MAC   Post-op Pain Management:    Induction: Intravenous  PONV Risk Score and Plan: 2 and Treatment may vary due to age or medical condition  Airway Management Planned: Nasal Cannula and Simple Face Mask  Additional Equipment: None  Intra-op Plan:   Post-operative Plan:   Informed Consent: I have reviewed the patients History and Physical, chart, labs and discussed the procedure including the risks, benefits and alternatives for the proposed anesthesia with the  patient or authorized representative who has indicated his/her understanding and acceptance.   Dental advisory given  Plan Discussed with: CRNA and Surgeon  Anesthesia Plan Comments:         Anesthesia Quick Evaluation

## 2016-11-11 NOTE — Telephone Encounter (Signed)
-----   Message from Mena Goes, RN sent at 11/10/2016  2:32 PM EDT ----- Regarding: 2-4 weeks no duplex   ----- Message ----- From: Elam Dutch, MD Sent: 11/10/2016   1:21 PM To: [facility] Charge Pool  Plication left arm AVF Nurse asst  Follow up 2-4 weeks no study  [name]

## 2016-11-11 NOTE — H&P (View-Only) (Signed)
HISTORY AND PHYSICAL     CC:  Scab on fistula Requesting Provider:  [name], MD  HPI: This is a 56 y.o. male who underwent thrombectomy of left arm fistula with plication of aneurysm on 12/25/14 for a bleeding episode by Dr. [name].  He states that the fistula was created June 20041 and is 56 years old.    He presents today for a scab that has been on his fistula for ~ 2 weeks.  He states this is in the same place as when he had a bleeding episode a couple of years ago.   He states that he was referred by [facility] for this and concern for prolonged bleeding.  He dialyzes at home for two days on and one day off and is flexible with his schedule. He is on coumadin for afib.  He has a defibrillator on the right side.  He states that he gets pain in his right hip after ambulating for a long period of time.  He had ABI's in August 2017 with 0.79 on the right and 1.47 on the left.  He state he has a knot on the right side.  The pt is on a statin for cholesterol management.  He is on synthroid for hypothyroidism.  He has a hx of thyroid cancer.  He is on coumadin and Amiodarone for afib.   Past Medical History:  Diagnosis Date  . AICD (automatic cardioverter/defibrillator) present    Medtronic   . Arthritis    "knees" (08/05/2016)  . Atrial fibrillation ([place])   . ESRD (end stage renal disease) on dialysis ([place])    "M, T, W, T, F; NX stage hemodialysis; I do it at home" (08/05/2016)  . Great toe amputation status ([place])    status post left hallux amputation 08/01/14  . History of blood transfusion 2009   "S/P biopsy for prostate cancer check"  . Hypothyroidism (acquired)   . Nonischemic cardiomyopathy ([place])   . Pneumonia 2014; 08/05/2016  . PONV (postoperative nausea and vomiting)   . Pulmonary HTN ([place])   . Thyroid cancer ([place]) 2011    Past Surgical History:  Procedure Laterality Date  . CARDIAC CATHETERIZATION Right 2017  . CARDIAC DEFIBRILLATOR PLACEMENT  2009  .  DIALYSIS FISTULA CREATION Left 2003   "upper arm"  . HERNIA REPAIR    . KIDNEY TRANSPLANT  2009  . KNEE SURGERY Bilateral 2000s   "drained fluid"  . LAPAROSCOPIC GASTRIC BANDING  2006  . PROSTATE BIOPSY  2009  . REVISON OF ARTERIOVENOUS FISTULA Left 5/64/3329   Procedure: PLICATION OF ARTERIOVENOUS FISTULA ANEURYSM;  Surgeon: Elam Dutch, MD;  Location: [facility];  Service: Vascular;  Laterality: Left;  . THROMBECTOMY W/ EMBOLECTOMY Left 12/25/2014   Procedure: THROMBECTOMY ARTERIOVENOUS FISTULA;  Surgeon: Elam Dutch, MD;  Location: [facility];  Service: Vascular;  Laterality: Left;  . THYROIDECTOMY  2011  . TOE AMPUTATION Bilateral     right 1st and  2nd digits; left 1st and 3rd digits"  . UMBILICAL HERNIA REPAIR  X 2    Allergies  Allergen Reactions  . Enalaprilat Swelling    Mouth swelling  . Ivp Dye [Iodinated Diagnostic Agents] Nausea And Vomiting  . Vasotec [Enalapril] Swelling    Current Outpatient Prescriptions  Medication Sig Dispense Refill  . albuterol (PROVENTIL) (2.5 MG/3ML) 0.083% nebulizer solution Take 3 mLs (2.5 mg total) by nebulization every 6 (six) hours as needed for wheezing or shortness of breath. 75 mL  12  . ambrisentan (LETAIRIS) 10 MG tablet Take 1 tablet (10 mg total) by mouth daily. (Patient taking differently: Take 10 mg by mouth at bedtime. ) 30 tablet 11  . amiodarone (PACERONE) 200 MG tablet Take 200 mg by mouth daily.    Marland Kitchen atorvastatin (LIPITOR) 40 MG tablet TAKE 1 TABLET BY MOUTH DAILY 90 tablet 3  . calcium acetate (PHOSLO) 667 MG capsule Take 1,334 mg by mouth 3 (three) times daily with meals.     . cinacalcet (SENSIPAR) 90 MG tablet Take 90 mg by mouth daily.    Marland Kitchen levothyroxine (SYNTHROID, LEVOTHROID) 300 MCG tablet Take 300 mcg by mouth daily.     . midodrine (PROAMATINE) 10 MG tablet Take 1 tablet (10 mg total) by mouth 3 (three) times daily. 90 tablet 6  . tadalafil, PAH, (ADCIRCA) 20 MG tablet 1 tab daily 30 tablet 11   No current  facility-administered medications for this visit.     Family History  Problem Relation Age of Onset  . Heart failure Mother   . Hypertension Mother   . CAD Mother 21  . Emphysema Mother   . Hypertension Father   . Kidney failure Father     Social History   Social History  . Marital status: Married    Spouse name: N/A  . Number of children: 1  . Years of education: N/A   Occupational History  . Not on file.   Social History Main Topics  . Smoking status: Never Smoker  . Smokeless tobacco: Never Used  . Alcohol use 0.0 oz/week     Comment: 08/05/2016 "might have 1 shot/month of liquor"  . Drug use: No  . Sexual activity: Yes   Other Topics Concern  . Not on file   Social History Narrative   Retired Archivist.       ROS: [x]  Positive   [ ]  Negative   [ ]  All sytems reviewed and are negative  Cardiac: []  chest pain/pressure []  palpitations []  SOB lying flat []  DOE  Vascular: [x]  pain in right hip while walking a long distance []  pain in feet when lying flat []  hx of DVT []  hx of phlebitis [x]  swelling in legs []  varicose veins [x]  afib  Pulmonary: []  productive cough []  asthma []  wheezing  Neurologic: []  weakness in []  arms []  legs []  numbness in []  arms []  legs [] difficulty speaking or slurred speech []  temporary loss of vision in one eye []  dizziness  Hematologic: []  bleeding problems []  problems with blood clotting easily  GI []  vomiting blood []  blood in stool  GU: [x]  CKD/renal failure  [x]  HD---[x]  HD at home []  M/W/F []  T/T/F [x]  ulceration on fistula [x]  hx failed renal transplant []  burning with urination []  blood in urine  Psychiatric: []  hx of major depression  Integumentary: []  rashes []  ulcers  Constitutional: []  fever []  chills  PHYSICAL EXAMINATION:  Vitals:   11/04/16 1335  Weight: 274 lb (124.3 kg)  Height: 6\' 1"  (1.854 m)   Vitals:   11/04/16 1335  BP: 99/66  Pulse: 80  Resp: 20    Temp: 97 F (36.1 C)     General:  WDWN in NAD Gait: Normal HENT: WNL Pulmonary: normal non-labored breathing , without Rales, rhonchi,  wheezing Cardiac: regular, without  Murmurs, rubs or gallops Abdomen: soft, NT; 1cm solid mobile mass right lateral abdominal area above hip Skin: without rashes Vascular Exam/Pulses:   Right Left  Radial 2+ (normal)  Unable to palpate   Ulnar 1+ (weak) Unable to palpate   Femoral 1+ (weak) 1+ (weak)   Extremities:  +thrill/bruit within fistula; 1cm x 1cm ulceration/scab over distal portion of fistula and is non mobile.  Musculoskeletal: no muscle wasting or atrophy  Neurologic: A&O X 3; Moving all extremities equally;  Speech is fluent/normal  Non-Invasive Vascular Imaging:   None    ASSESSMENT/PLAN: 56 y.o. male with ESRD who dialyzes at home 2 days on and 1 day off with ulceration over distal portion of fistula   - the ulceration measures 1cm x 1cm and is non mobile over the fistula.  Dr. Donnetta Hutching has examined the pt and recommends revision of this as he is at increased risk from bleeding similar to the episode a couple of years ago.   -pt is on coumadin for afib-he states he normally just stops this 3-4 days prior to a procedure and is not bridged.   -we will schedule him for revision of his LUA AVF in the near future with either Dr. Donnetta Hutching or Dr. [name]. -pt does have a palpable solid, mobile mass above the right lateral hip area-discussed with pt follow up with his primary care to continue evaluating this.  He expressed understanding and plans to make appointment.    Leontine Locket, PA-C [facility] [phone]  Clinic MD:   Pt seen and examined with Dr. Donnetta Hutching  I have examined the patient, reviewed and agree with above. Does have an adherent eschar in the lower portion of his AV fistula. Have recommended excision of this area to reduce risk of bleeding. We'll schedule this around his dialysis schedule  Curt Jews, MD 11/04/2016 2:22 PM

## 2016-11-11 NOTE — Telephone Encounter (Signed)
Sched appt 12/18/16 at 12:30. Spoke to pt.

## 2016-11-11 NOTE — Transfer of Care (Signed)
Immediate Anesthesia Transfer of Care Note  Patient: Adam [name].  Procedure(s) Performed: Procedure(s): INSERTION OF TUNNELED DIALYSIS CATHETER (N/A)  Patient Location: PACU  Anesthesia Type:MAC  Level of Consciousness: awake, alert , oriented and patient cooperative  Airway & Oxygen Therapy: Patient Spontanous Breathing and Patient connected to face mask oxygen  Post-op Assessment: Report given to RN and Post -op Vital signs reviewed and stable  Post vital signs: Reviewed and stable  Last Vitals:  Vitals:   11/11/16 1418 11/11/16 1540  BP: 117/75 (P) 97/67  Pulse: 84 (P) 77  Resp: 18 (P) 20  Temp: (!) 36.4 C (P) 36.4 C    Last Pain:  Vitals:   11/11/16 1426  TempSrc:   PainSc: 1       Patients Stated Pain Goal: 2 (59/56/38 7564)  Complications: No apparent anesthesia complications

## 2016-11-11 NOTE — Interval H&P Note (Signed)
History and Physical Interval Note:  11/11/2016 2:46 PM  Adam [name].  has presented today for surgery, with the diagnosis of Mechanical Complication with Arteriovenous Fistula T82.510 D  The various methods of treatment have been discussed with the patient and family. After consideration of risks, benefits and other options for treatment, the patient has consented to  Procedure(s): INSERTION OF TUNNELED DIALYSIS CATHETER (N/A) as a surgical intervention .  The patient's history has been reviewed, patient examined, no change in status, stable for surgery.  I have reviewed the patient's chart and labs.  Questions were answered to the patient's satisfaction.     [name]

## 2016-11-11 NOTE — Op Note (Signed)
11/11/2016  PREOP DIAGNOSIS: Chronic kidney disease  POSTOP DIAGNOSIS: Chronic kidney disease  PROCEDURE: Ultrasound guided placement of left IJ tunneled dialysis catheter  (28 cm)  SURGEON: Judeth Cornfield. Scot Dock, MD, FACS  ASSIST: none  ANESTHESIA: local with sedation   EBL: minimal  FINDINGS: patent left IJ (AICD is on the right)  INDICATIONS: for HD  TECHNIQUE: The patient was taken to the operating room and sedated by anesthesia. The neck and upper chest were prepped and draped in the usual sterile fashion. After the skin was anesthetized with 1% lidocaine, and under ultrasound guidance, the left IJ was cannulated and a guidewire introduced into the superior vena cava under fluoroscopic control. The tract over the wire was dilated and then the dilator and peel-away sheath were passed over the wire and the wire and dilator removed. The catheter was passed through the peel-away sheath and positioned in the right atrium. The exit site for the catheter was selected and the skin anesthetized between the 2 areas. The catheter was then brought through the tunnel, cut to the appropriate length, and the distal ports were attached. Both ports withdrew easily, were then flushed with heparinized saline and filled with concentrated heparin. The catheter was secured at its exit site with a 3-0 nylon suture. The IJ cannulation site was closed with a 4-0 subcuticular stitch. A sterile dressing was applied. The patient tolerated the procedure well and was transferred to the recovery room in stable condition. All needle and sponge counts were correct.  [name], MD, FACS [facility]: 11/11/2016 DATE OF DICTATION: 11/11/2016

## 2016-11-12 ENCOUNTER — Encounter (HOSPITAL_COMMUNITY): Payer: Self-pay | Admitting: Vascular Surgery

## 2016-11-14 ENCOUNTER — Other Ambulatory Visit: Payer: Self-pay | Admitting: Cardiology

## 2016-11-14 ENCOUNTER — Encounter (HOSPITAL_COMMUNITY): Payer: Self-pay | Admitting: Vascular Surgery

## 2016-11-14 ENCOUNTER — Encounter: Payer: Self-pay | Admitting: Cardiovascular Disease

## 2016-11-14 NOTE — Anesthesia Postprocedure Evaluation (Signed)
Anesthesia Post Note  Patient: Adam [name].  Procedure(s) Performed: Procedure(s) (LRB): INSERTION OF TUNNELED DIALYSIS CATHETER (N/A)     Patient location during evaluation: PACU Anesthesia Type: MAC Level of consciousness: awake and alert Pain management: pain level controlled Vital Signs Assessment: post-procedure vital signs reviewed and stable Respiratory status: spontaneous breathing, nonlabored ventilation, respiratory function stable and patient connected to nasal cannula oxygen Cardiovascular status: stable and blood pressure returned to baseline Anesthetic complications: no    Last Vitals:  Vitals:   11/11/16 1610 11/11/16 1627  BP: 108/79 123/87  Pulse: 78 78  Resp: 16 16  Temp: 36.4 C     Last Pain:  Vitals:   11/11/16 1627  TempSrc:   PainSc: 0-No pain                 Devonn Giampietro

## 2016-11-18 ENCOUNTER — Encounter (HOSPITAL_COMMUNITY): Payer: Self-pay | Admitting: Vascular Surgery

## 2016-11-18 NOTE — OR Nursing (Signed)
Addendum created to show out of Phase II time

## 2016-11-21 ENCOUNTER — Ambulatory Visit: Payer: Medicare Other | Admitting: Podiatry

## 2016-12-02 ENCOUNTER — Encounter: Payer: Self-pay | Admitting: Vascular Surgery

## 2016-12-05 ENCOUNTER — Ambulatory Visit (INDEPENDENT_AMBULATORY_CARE_PROVIDER_SITE_OTHER): Payer: Medicare Other | Admitting: Podiatry

## 2016-12-05 ENCOUNTER — Other Ambulatory Visit: Payer: Self-pay

## 2016-12-05 ENCOUNTER — Ambulatory Visit: Payer: Medicare Other | Admitting: Endocrinology

## 2016-12-05 DIAGNOSIS — L84 Corns and callosities: Secondary | ICD-10-CM

## 2016-12-05 DIAGNOSIS — B351 Tinea unguium: Secondary | ICD-10-CM

## 2016-12-05 DIAGNOSIS — I739 Peripheral vascular disease, unspecified: Secondary | ICD-10-CM

## 2016-12-05 DIAGNOSIS — M79674 Pain in right toe(s): Secondary | ICD-10-CM

## 2016-12-05 DIAGNOSIS — E1149 Type 2 diabetes mellitus with other diabetic neurological complication: Secondary | ICD-10-CM | POA: Diagnosis not present

## 2016-12-05 DIAGNOSIS — M79675 Pain in left toe(s): Secondary | ICD-10-CM

## 2016-12-05 MED ORDER — TADALAFIL (PAH) 20 MG PO TABS: ORAL_TABLET | ORAL | 11 refills | Status: DC

## 2016-12-07 NOTE — Progress Notes (Signed)
Subjective: Mr. Bena presents the office today for diabetic foot evaluation as well as for thick, painful, elongated toenails that he cannot trim himself as well as for painful corns to the right fifth toe. He denies any drainage or pus and denies he swelling. Otherwise he states he is doing well and he has no issues with his feet. Denies any systemic complaints such as fevers, chills, nausea, vomiting. No acute changes since last appointment, and no other complaints at this time.   Objective: AAO x3, NAD DP/PT pulses palpable bilaterally, CRT less than 3 seconds Protective sensation decreased with Simms Weinstein monofilament Hyperkeratotic lesion present right fifth toe. Upon debridement no underlying ulceration, drainage or any signs of infection. Bilateral fourth and fifth digit toenails are dystrophic, discolored, hypertrophic and there is irritation in shoe gear. Upon debridement there is no drainage or pus. No swelling or redness from the toenail sites. No open lesions or pre-ulcerative lesions.  No pain with calf compression, swelling, warmth, erythema  Assessment: Symptomatic onychomycosis 4, hyperkeratotic lesions 1  Plan: -All treatment options discussed with the patient including all alternatives, risks, complications.  -Nails were sharply debrided 4 without complications or bleeding -Hyperkeratotic lesions debrided 1 without complications or bleeding. -Daily foot inspection. -Follow-up in 3 months or sooner if needed. Call any questions or concerns in the meantime.  Celesta Gentile, DPM

## 2016-12-09 LAB — PROTIME-INR: INR: 3.2 — AB (ref 0.9–1.1)

## 2016-12-10 ENCOUNTER — Ambulatory Visit (INDEPENDENT_AMBULATORY_CARE_PROVIDER_SITE_OTHER): Payer: Medicare Other | Admitting: Pharmacist

## 2016-12-10 DIAGNOSIS — I4891 Unspecified atrial fibrillation: Secondary | ICD-10-CM

## 2016-12-15 ENCOUNTER — Ambulatory Visit (INDEPENDENT_AMBULATORY_CARE_PROVIDER_SITE_OTHER): Payer: Medicare Other | Admitting: Family Medicine

## 2016-12-15 ENCOUNTER — Encounter: Payer: Self-pay | Admitting: Family Medicine

## 2016-12-15 VITALS — BP 126/64 | HR 80 | Temp 97.6°F | Ht 73.0 in | Wt 279.0 lb

## 2016-12-15 DIAGNOSIS — K439 Ventral hernia without obstruction or gangrene: Secondary | ICD-10-CM | POA: Diagnosis present

## 2016-12-15 NOTE — Progress Notes (Signed)
Subjective:    Patient ID: Adam Alm., male    DOB: Jul 14, 1960, 56 y.o.   MRN: [id number]   CC: Hernia and abdominal pain   HPI: Patient is a 56 yo male who presents today to discuss abdominal hernia. Patient reports two previous ventral hernia repair in the past while he was living in [place]. Patient reports for the past week he has noticed a bulged around his navel. Patient also reports some abdominal pain and diarrhea for the past week which has resolved on its own this past Thursday. Today, patient continue to endorse mild pain around his umbilicus. Patient denies any nausea, vomiting, diarrhea, fever or chills.   Smoking status reviewed   ROS: all other systems were reviewed and are negative other than in the HPI   Past Medical History:  Diagnosis Date  . AICD (automatic cardioverter/defibrillator) present    Medtronic   . Aortic stenosis    moderate AS by 08/2016 echo  . Arthritis    "knees" (08/05/2016)  . Atrial fibrillation ([place])   . ESRD (end stage renal disease) on dialysis ([place])    "M, T, W, T, F; NX stage hemodialysis; I do it at home" (08/05/2016)  . Great toe amputation status ([place])    status post left hallux amputation 08/01/14  . History of blood transfusion 2009   "S/P biopsy for prostate cancer check"  . Hypothyroidism (acquired)   . Mitral stenosis    moderate mitral stenosis  . Nonischemic cardiomyopathy ([place])   . Pneumonia 2014; 08/05/2016  . PONV (postoperative nausea and vomiting)   . Pulmonary HTN ([place])   . Thyroid cancer ([place]) 2011    Past Surgical History:  Procedure Laterality Date  . CARDIAC CATHETERIZATION Right 2017  . CARDIAC DEFIBRILLATOR PLACEMENT  2009  . DIALYSIS FISTULA CREATION Left 2003   "upper arm"  . HERNIA REPAIR    . INSERTION OF DIALYSIS CATHETER N/A 11/11/2016   Procedure: INSERTION OF TUNNELED DIALYSIS CATHETER;  Surgeon: Angelia Mould, MD;  Location: [facility];  Service: Vascular;  Laterality: N/A;  .  KIDNEY TRANSPLANT  2009  . KNEE SURGERY Bilateral 2000s   "drained fluid"  . LAPAROSCOPIC GASTRIC BANDING  2006  . PROSTATE BIOPSY  2009  . REVISON OF ARTERIOVENOUS FISTULA Left 7/89/3810   Procedure: PLICATION OF ARTERIOVENOUS FISTULA ANEURYSM;  Surgeon: Elam Dutch, MD;  Location: [facility];  Service: Vascular;  Laterality: Left;  . REVISON OF ARTERIOVENOUS FISTULA Left 11/10/2016   Procedure: REVISON OF ARTERIOVENOUS FISTULA - LEFT UPPER ARM;  Surgeon: Elam Dutch, MD;  Location: [facility];  Service: Vascular;  Laterality: Left;  . THROMBECTOMY W/ EMBOLECTOMY Left 12/25/2014   Procedure: THROMBECTOMY ARTERIOVENOUS FISTULA;  Surgeon: Elam Dutch, MD;  Location: [facility];  Service: Vascular;  Laterality: Left;  . THYROIDECTOMY  2011  . TOE AMPUTATION Bilateral     right 1st and  2nd digits; left 1st and 3rd digits"  . UMBILICAL HERNIA REPAIR  X 2    Objective:  BP 126/64   Pulse 80   Temp 97.6 F (36.4 C) (Oral)   Ht 6\' 1"  (1.854 m)   Wt 279 lb (126.6 kg)   SpO2 99%   BMI 36.81 kg/m   Vitals and nursing note reviewed  General: NAD, pleasant, able to participate in exam Cardiac: RRR, normal heart sounds, no murmurs. 2+ radial and PT pulses bilaterally Respiratory: CTAB, normal effort, No wheezes, rales or rhonchi Abdomen: soft,  periumbilical protruding mass noted on exam with valsalva maneuver, no pain noted on exam with palpation, bulge has been reducible. Extremities: no edema or cyanosis. WWP. Skin: warm and dry, no rashes noted Neuro: alert and oriented x4, no focal deficits Psych: Normal affect and mood   Assessment & Plan:   #Periumbilical hernia, chronic Patient presents with protruding mass in the periumbilical area where he has had previous hernia repair. Mass is reducible with no concern with incarceration. Patient is well appearing with minimal abdominal pain. Patient is afebrile with stable vitals. Given location of her ventral hernia and prior repair, could  refer patient to surgery for further evaluation. patient likely has some adhesions from prior surgery and might not benefits from another surgical intervention given the high risk of recurrence. Patient given options for conservative management but would like surgical referral. --Will refer to General Surgery --Will follow up in clinic     [name], MD [place] PGY-2

## 2016-12-15 NOTE — Patient Instructions (Signed)
It was great seeing you today! We have addressed the following issues today  1. I will refer you to General Surgery for further evaluation.  If we did any lab work today, and the results require attention, either me or my nurse will get in touch with you. If everything is normal, you will get a letter in mail and a message via . If you don't hear from [place] in two weeks, please give [place] a call. Otherwise, we look forward to seeing you again at your next visit. If you have any questions or concerns before then, please call the clinic at [phone].  Please bring all your medications to every doctors visit  Sign up for My Chart to have easy access to your labs results, and communication with your Primary care physician. Please ask Front Desk for some assistance.   Please check-out at the front desk before leaving the clinic.    Take Care,   Dr. Andy Gauss   Umbilical Hernia, Adult A hernia is a bulge of tissue that pushes through an opening between muscles. An umbilical hernia happens in the abdomen, near the belly button (umbilicus). The hernia may contain tissues from the small intestine, large intestine, or fatty tissue covering the intestines (omentum). Umbilical hernias in adults tend to get worse over time, and they require surgical treatment. There are several types of umbilical hernias. You may have:  A hernia located just above or below the umbilicus (indirect hernia). This is the most common type of umbilical hernia in adults.  A hernia that forms through an opening formed by the umbilicus (direct hernia).  A hernia that comes and goes (reducible hernia). A reducible hernia may be visible only when you strain, lift something heavy, or cough. This type of hernia can be pushed back into the abdomen (reduced).  A hernia that traps abdominal tissue inside the hernia (incarcerated hernia). This type of hernia cannot be reduced.  A hernia that cuts off blood flow to the tissues inside  the hernia (strangulated hernia). The tissues can start to die if this happens. This type of hernia requires emergency treatment.  What are the causes? An umbilical hernia happens when tissue inside the abdomen presses on a weak area of the abdominal muscles. What increases the risk? You may have a greater risk of this condition if you:  Are obese.  Have had several pregnancies.  Have a buildup of fluid inside your abdomen (ascites).  Have had surgery that weakens the abdominal muscles.  What are the signs or symptoms? The main symptom of this condition is a painless bulge at or near the belly button. A reducible hernia may be visible only when you strain, lift something heavy, or cough. Other symptoms may include:  Dull pain.  A feeling of pressure.  Symptoms of a strangulated hernia may include:  Pain that gets increasingly worse.  Nausea and vomiting.  Pain when pressing on the hernia.  Skin over the hernia becoming red or purple.  Constipation.  Blood in the stool.  How is this diagnosed? This condition may be diagnosed based on:  A physical exam. You may be asked to cough or strain while standing. These actions increase the pressure inside your abdomen and force the hernia through the opening in your muscles. Your health care provider may try to reduce the hernia by pressing on it.  Your symptoms and medical history.  How is this treated? Surgery is the only treatment for an umbilical hernia. Surgery  for a strangulated hernia is done as soon as possible. If you have a small hernia that is not incarcerated, you may need to lose weight before having surgery. Follow these instructions at home:  Lose weight, if told by your health care provider.  Do not try to push the hernia back in.  Watch your hernia for any changes in color or size. Tell your health care provider if any changes occur.  You may need to avoid activities that increase pressure on your  hernia.  Do not lift anything that is heavier than 10 lb (4.5 kg) until your health care provider says that this is safe.  Take over-the-counter and prescription medicines only as told by your health care provider.  Keep all follow-up visits as told by your health care provider. This is important. Contact a health care provider if:  Your hernia gets larger.  Your hernia becomes painful. Get help right away if:  You develop sudden, severe pain near the area of your hernia.  You have pain as well as nausea or vomiting.  You have pain and the skin over your hernia changes color.  You develop a fever. This information is not intended to replace advice given to you by your health care provider. Make sure you discuss any questions you have with your health care provider. Document Released: 09/28/2015 Document Revised: 12/30/2015 Document Reviewed: 09/28/2015 Elsevier Interactive Patient Education  [employer].

## 2016-12-18 ENCOUNTER — Encounter: Payer: Self-pay | Admitting: Vascular Surgery

## 2016-12-18 ENCOUNTER — Ambulatory Visit (INDEPENDENT_AMBULATORY_CARE_PROVIDER_SITE_OTHER): Payer: Self-pay | Admitting: Vascular Surgery

## 2016-12-18 VITALS — BP 116/79 | HR 76 | Temp 97.1°F | Resp 16 | Ht 73.0 in | Wt 282.0 lb

## 2016-12-18 DIAGNOSIS — N186 End stage renal disease: Secondary | ICD-10-CM

## 2016-12-18 DIAGNOSIS — Z992 Dependence on renal dialysis: Secondary | ICD-10-CM

## 2016-12-18 NOTE — Progress Notes (Signed)
Patient is a 56 year old male who returns today for follow-up after plication of his left upper extremity AV fistula. He reports no incisional drainage. He currently is using a dialysis catheter for his hemodialysis. His operation was 11/10/2016.  Physical exam:  Vitals:   12/18/16 1230  BP: 116/79  Pulse: 76  Resp: 16  Temp: (!) 97.1 F (36.2 C)  TempSrc: Oral  SpO2: 97%  Weight: 282 lb (127.9 kg)  Height: 6\' 1"  (1.854 m)    Extremities: Palpable thrill audible bruit in fistula incision well-healed  Assessment: Healing left arm AV fistula.   Plan: Okay to cannulate at this point. Patient will be scheduled for removal of his catheter after successful Cannulation per week.  [name], MD [facility] Office: [phone] Pager: [phone]

## 2016-12-19 ENCOUNTER — Encounter (HOSPITAL_COMMUNITY): Payer: Self-pay | Admitting: Vascular Surgery

## 2017-01-08 ENCOUNTER — Telehealth: Payer: Self-pay | Admitting: Pharmacist Clinician (PhC)/ Clinical Pharmacy Specialist

## 2017-01-08 NOTE — Telephone Encounter (Signed)
Coumadin letter 

## 2017-01-16 ENCOUNTER — Other Ambulatory Visit: Payer: Self-pay | Admitting: Pulmonary Disease

## 2017-01-16 MED ORDER — AMBRISENTAN 10 MG PO TABS: 10.0000 mg | ORAL_TABLET | Freq: Every day | ORAL | 11 refills | Status: DC

## 2017-01-19 ENCOUNTER — Ambulatory Visit (INDEPENDENT_AMBULATORY_CARE_PROVIDER_SITE_OTHER): Payer: Medicare Other | Admitting: *Deleted

## 2017-01-19 DIAGNOSIS — I428 Other cardiomyopathies: Secondary | ICD-10-CM

## 2017-01-19 NOTE — Progress Notes (Signed)
Remote ICD transmission.   

## 2017-01-20 LAB — CUP PACEART REMOTE DEVICE CHECK
Battery Voltage: 3 V
Brady Statistic AP VP Percent: 0.02 %
Brady Statistic AS VP Percent: 0.18 %
Brady Statistic RA Percent Paced: 0.19 %
Brady Statistic RV Percent Paced: 0.22 %
Date Time Interrogation Session: 20180910073425
HIGH POWER IMPEDANCE MEASURED VALUE: 39 Ohm
HighPow Impedance: 44 Ohm
Implantable Lead Implant Date: 20090109
Implantable Lead Location: 753860
Implantable Lead Model: 6947
Implantable Pulse Generator Implant Date: 20160205
Lead Channel Impedance Value: 304 Ohm
Lead Channel Pacing Threshold Amplitude: 2.25 V
Lead Channel Sensing Intrinsic Amplitude: 1.25 mV
Lead Channel Sensing Intrinsic Amplitude: 5.625 mV
Lead Channel Sensing Intrinsic Amplitude: 5.625 mV
Lead Channel Setting Pacing Amplitude: 4.75 V
Lead Channel Setting Pacing Amplitude: 4.75 V
Lead Channel Setting Pacing Pulse Width: 0.4 ms
MDC IDC LEAD IMPLANT DT: 20090109
MDC IDC LEAD LOCATION: 753859
MDC IDC MSMT BATTERY REMAINING LONGEVITY: 106 mo
MDC IDC MSMT LEADCHNL RA IMPEDANCE VALUE: 532 Ohm
MDC IDC MSMT LEADCHNL RA PACING THRESHOLD AMPLITUDE: 2 V
MDC IDC MSMT LEADCHNL RA PACING THRESHOLD PULSEWIDTH: 0.4 ms
MDC IDC MSMT LEADCHNL RA SENSING INTR AMPL: 1.25 mV
MDC IDC MSMT LEADCHNL RV IMPEDANCE VALUE: 969 Ohm
MDC IDC MSMT LEADCHNL RV PACING THRESHOLD PULSEWIDTH: 0.4 ms
MDC IDC SET LEADCHNL RV SENSING SENSITIVITY: 0.3 mV
MDC IDC STAT BRADY AP VS PERCENT: 0.17 %
MDC IDC STAT BRADY AS VS PERCENT: 99.63 %

## 2017-01-21 ENCOUNTER — Telehealth: Payer: Self-pay

## 2017-01-21 ENCOUNTER — Encounter: Payer: Self-pay | Admitting: Cardiology

## 2017-01-21 NOTE — Telephone Encounter (Signed)
Forms found in the PA folder for the PA for the Tadalafil for the [facility].  These stated that they needed to be completed and sent in by 01/19/2017.  This has been placed in BQ look at and will forward to [facility] to follow up on.

## 2017-01-22 ENCOUNTER — Telehealth: Payer: Self-pay

## 2017-01-22 NOTE — Telephone Encounter (Signed)
Prior Authorization completed via covermymeds. The PA was sent to plan on 01/22/17. The key for this plan is [id number].

## 2017-01-22 NOTE — Telephone Encounter (Signed)
PA has been approved through the end of calendar year.  Will close encounter.

## 2017-01-23 NOTE — Progress Notes (Addendum)
HPI The patient presents for evaluation of pulmonary HTN.  He has end-stage renal disease secondary to uncontrolled hypertension and apparent glomerulonephritis.   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 He is on dialysis.  He was seen at [facility] for part of his transplant evaluation.  Echocardiogram demonstrated well-preserved ejection fraction with some moderate tricuspid regurgitation. He had a right heart catheterization and his pulmonary pressures were actually quite good with his pulmonary vascular resistance being 2.2 [place] units.    Perfusion study was negative for evidence of chronic embolism.    Since I last saw him he has done well.  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  He has home dialysis and feels much better since he started this.  The patient denies any new symptoms such as chest discomfort, neck or arm discomfort. There has been no new shortness of breath, PND or orthopnea. There have been no reported palpitations, presyncope or syncope.   Allergies  Allergen Reactions  . Enalaprilat Swelling    Mouth swelling  . Vasotec [Enalapril] Swelling    MOUTH SWELLING  . Ivp Dye [Iodinated Diagnostic Agents] Nausea And Vomiting    Current Outpatient Prescriptions  Medication Sig Dispense Refill  . ambrisentan (LETAIRIS) 10 MG tablet Take 1 tablet (10 mg total) by mouth daily. 30 tablet 11  . amiodarone (PACERONE) 200 MG tablet Take 200 mg by mouth every evening.     Marland Kitchen atorvastatin (LIPITOR) 40 MG tablet TAKE 1 TABLET BY MOUTH DAILY (Patient taking differently: TAKE 1 TABLET BY MOUTH DAILY IN THE EVENING) 90 tablet 3  . cinacalcet (SENSIPAR) 90 MG tablet Take 90 mg by mouth every evening.     Marland Kitchen levothyroxine (SYNTHROID, LEVOTHROID) 300 MCG tablet Take 300 mcg by mouth See admin instructions. TAKE 1 TABLET (300 MCG) DAILY EXCEPT SUNDAYS (DO NOT TAKE ANY SYNTHROID)    . midodrine (PROAMATINE) 10 MG tablet Take 1 tablet (10 mg  total) by mouth 3 (three) times daily. 90 tablet 6  . tadalafil, PAH, (ADCIRCA) 20 MG tablet 1 tab daily 30 tablet 11  . warfarin (COUMADIN) 6 MG tablet Take 6-9 mg by mouth See admin instructions. Take 1.5 tablets (9 mg) by mouth in the evening, except take 1 tablet (6 mg) in the evening on Sunday.  3   No current facility-administered medications for this visit.     Past Medical History:  Diagnosis Date  . AICD (automatic cardioverter/defibrillator) present    Medtronic   . Aortic stenosis    moderate AS by 08/2016 echo  . Arthritis    "knees" (08/05/2016)  . Atrial fibrillation ([place])   . ESRD (end stage renal disease) on dialysis ([place])    "M, T, W, T, F; NX stage hemodialysis; I do it at home" (08/05/2016)  . Great toe amputation status ([place])    status post left hallux amputation 08/01/14  . History of blood transfusion 2009   "S/P biopsy for prostate cancer check"  . Hypothyroidism (acquired)   . Mitral stenosis    moderate mitral stenosis  . Nonischemic cardiomyopathy ([place])   . Pneumonia 2014; 08/05/2016  . PONV (postoperative nausea and vomiting)   . Pulmonary HTN ([place])   . Thyroid cancer ([place]) 2011    Past Surgical History:  Procedure Laterality Date  . CARDIAC CATHETERIZATION Right 2017  . CARDIAC DEFIBRILLATOR PLACEMENT  2009  . DIALYSIS FISTULA CREATION Left 2003   "upper arm"  .  HERNIA REPAIR    . INSERTION OF DIALYSIS CATHETER N/A 11/11/2016   Procedure: INSERTION OF TUNNELED DIALYSIS CATHETER;  Surgeon: Angelia Mould, MD;  Location: [facility];  Service: Vascular;  Laterality: N/A;  . KIDNEY TRANSPLANT  2009  . KNEE SURGERY Bilateral 2000s   "drained fluid"  . LAPAROSCOPIC GASTRIC BANDING  2006  . PROSTATE BIOPSY  2009  . REVISON OF ARTERIOVENOUS FISTULA Left 5/46/2703   Procedure: PLICATION OF ARTERIOVENOUS FISTULA ANEURYSM;  Surgeon: Elam Dutch, MD;  Location: [facility];  Service: Vascular;  Laterality: Left;  . REVISON OF ARTERIOVENOUS FISTULA Left  11/10/2016   Procedure: REVISON OF ARTERIOVENOUS FISTULA - LEFT UPPER ARM;  Surgeon: Elam Dutch, MD;  Location: [facility];  Service: Vascular;  Laterality: Left;  . THROMBECTOMY W/ EMBOLECTOMY Left 12/25/2014   Procedure: THROMBECTOMY ARTERIOVENOUS FISTULA;  Surgeon: Elam Dutch, MD;  Location: [facility];  Service: Vascular;  Laterality: Left;  . THYROIDECTOMY  2011  . TOE AMPUTATION Bilateral     right 1st and  2nd digits; left 1st and 3rd digits"  . UMBILICAL HERNIA REPAIR  X 2    ROS:  As stated in the HPI and negative for all other systems.  PHYSICAL EXAM BP 90/60   Pulse 78   Ht 6\' 1"  (1.854 m)   Wt 283 lb (128.4 kg)   BMI 37.34 kg/m   GENERAL:  Well appearing NECK:  No jugular venous distention, waveform within normal limits, carotid upstroke brisk and symmetric, no bruits, no thyromegaly LUNGS:  Clear to auscultation bilaterally CHEST:  Well healed ICD pocket HEART:  PMI not displaced or sustained,S1 and S2 within normal limits, no S3, no S4, no clicks, no rubs, 2/6 holosystolic murmur at the apex, no diastolic murmurs ABD:  Flat, positive bowel sounds normal in frequency in pitch, no bruits, no rebound, no guarding, no midline pulsatile mass, no hepatomegaly, no splenomegaly EXT:  2 plus pulses throughout, no edema, no cyanosis no clubbing, dialysis access left upper arm.   EKG:  NA  Lab Results  Component Value Date   TSH 3.730 01/26/2017   ALT 8 01/26/2017   AST 19 01/26/2017   ALKPHOS 95 01/26/2017   BILITOT 0.5 01/26/2017   PROT 6.5 01/26/2017   ALBUMIN 4.0 01/26/2017     ASSESSMENT AND PLAN  CARDIOMYOPATHY:   The EF is preserved on echo in April.  No change in meds is planned. .   No change in therapy is planned.   He will continue on the meds as listed.     ATRIAL FIB:  He has been maintaining NSR.  He tolerates anticoagulation.  No change in therapy is indicated.    [name]. has a CHA2DS2 - VASc score of 2.  I will repeat liver profile and  TSH today.   AS:  This was moderate on echo and I will follow this clinically.  I will repeat an echo in April.   ICD:  He is up to date with follow up. For this appt I reviewed the ICD transmission from last week.  There were no events.  He had this checked today.  His Optivol is chronically elevated but doesn't seem to correlate with his fluid status.  PULMONARY HTN:  He had normal pressures on cath at [facility] previously although his estimated pressure on echo is elevated.  He remains on meds as listed.  He is not having overt symptoms and I will make sure  that he has follow up with Dr. Lake Bells.  HYPOTENSION:   He has chronic low BPs but no syncope.  He had some concern that his Midodrine was interferring with his pulmonary HTN but that is likely not the case and we discussed this.   MS:  Moderate stenosis.  I will follow this clinically and with repeat echoes.

## 2017-01-26 ENCOUNTER — Encounter: Payer: Self-pay | Admitting: Cardiovascular Disease

## 2017-01-26 ENCOUNTER — Encounter: Payer: Self-pay | Admitting: Cardiology

## 2017-01-26 ENCOUNTER — Ambulatory Visit (INDEPENDENT_AMBULATORY_CARE_PROVIDER_SITE_OTHER): Payer: Medicare Other | Admitting: Cardiology

## 2017-01-26 ENCOUNTER — Ambulatory Visit (INDEPENDENT_AMBULATORY_CARE_PROVIDER_SITE_OTHER): Payer: Medicare Other | Admitting: Cardiovascular Disease

## 2017-01-26 VITALS — BP 90/60 | HR 78 | Ht 73.0 in | Wt 283.0 lb

## 2017-01-26 DIAGNOSIS — I959 Hypotension, unspecified: Secondary | ICD-10-CM | POA: Diagnosis not present

## 2017-01-26 DIAGNOSIS — Z95810 Presence of automatic (implantable) cardiac defibrillator: Secondary | ICD-10-CM | POA: Diagnosis not present

## 2017-01-26 DIAGNOSIS — I480 Paroxysmal atrial fibrillation: Secondary | ICD-10-CM | POA: Diagnosis not present

## 2017-01-26 DIAGNOSIS — Z79899 Other long term (current) drug therapy: Secondary | ICD-10-CM

## 2017-01-26 DIAGNOSIS — I272 Pulmonary hypertension, unspecified: Secondary | ICD-10-CM | POA: Diagnosis not present

## 2017-01-26 DIAGNOSIS — I342 Nonrheumatic mitral (valve) stenosis: Secondary | ICD-10-CM | POA: Diagnosis not present

## 2017-01-26 DIAGNOSIS — I350 Nonrheumatic aortic (valve) stenosis: Secondary | ICD-10-CM

## 2017-01-26 LAB — HEPATIC FUNCTION PANEL
ALBUMIN: 4 g/dL (ref 3.5–5.5)
ALK PHOS: 95 IU/L (ref 39–117)
ALT: 8 IU/L (ref 0–44)
AST: 19 IU/L (ref 0–40)
Bilirubin Total: 0.5 mg/dL (ref 0.0–1.2)
Bilirubin, Direct: 0.18 mg/dL (ref 0.00–0.40)
Total Protein: 6.5 g/dL (ref 6.0–8.5)

## 2017-01-26 LAB — TSH: TSH: 3.73 u[IU]/mL (ref 0.450–4.500)

## 2017-01-26 NOTE — Patient Instructions (Signed)
Dr Sallyanne Kuster recommends that you continue on your current medications as directed. Please refer to the Current Medication list given to you today.  Remote monitoring is used to monitor your Pacemaker or ICD from home. This monitoring reduces the number of office visits required to check your device to one time per year. It allows [place] to keep an eye on the functioning of your device to ensure it is working properly. You are scheduled for a device check from home on Monday, December 10th, 2018. You may send your transmission at any time that day. If you have a wireless device, the transmission will be sent automatically. After your physician reviews your transmission, you will receive a notification with your next transmission date.  Dr Sallyanne Kuster recommends that you schedule a follow-up appointment in 12 months with a defibrillator check. You will receive a reminder letter in the mail two months in advance. If you don't receive a letter, please call our office to schedule the follow-up appointment.  If you need a refill on your cardiac medications before your next appointment, please call your pharmacy.

## 2017-01-26 NOTE — Patient Instructions (Signed)
Medication Instructions:  Continue current medications  If you need a refill on your cardiac medications before your next appointment, please call your pharmacy.  Labwork: TSH and Liver Function  Testing/Procedures: Your physician has requested that you have an echocardiogram in April 2019. Echocardiography is a painless test that uses sound waves to create images of your heart. It provides your doctor with information about the size and shape of your heart and how well your heart's chambers and valves are working. This procedure takes approximately one hour. There are no restrictions for this procedure.    Follow-Up: Your physician wants you to follow-up in: April 2019.    Thank you for choosing [facility]!!

## 2017-01-27 ENCOUNTER — Ambulatory Visit (INDEPENDENT_AMBULATORY_CARE_PROVIDER_SITE_OTHER): Payer: Medicare Other | Admitting: Pulmonary Disease

## 2017-01-27 ENCOUNTER — Ambulatory Visit: Payer: Medicare Other

## 2017-01-27 ENCOUNTER — Encounter: Payer: Self-pay | Admitting: Pulmonary Disease

## 2017-01-27 VITALS — BP 122/66 | HR 82 | Ht 73.0 in | Wt 278.6 lb

## 2017-01-27 DIAGNOSIS — I272 Pulmonary hypertension, unspecified: Secondary | ICD-10-CM | POA: Diagnosis not present

## 2017-01-27 DIAGNOSIS — I959 Hypotension, unspecified: Secondary | ICD-10-CM | POA: Diagnosis not present

## 2017-01-27 NOTE — Progress Notes (Signed)
Subjective:    Patient ID: Adam Alm., male    DOB: 09-27-1960, 56 y.o.   MRN: [id number]  Synopsis: Diagnosed with pulmonary hypertension in 2012. 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 Unfortunately his kidney transplant failed for uncertain reasons by 6644. Was diagnosed with pulmonary hypertension by 2012 in [place]. Was started on [place] in [place] and moved to [place] not long afterwards. Developed atrial fibrillation around 2009, had a defibrillator placed.  08/2015 R[facility]: Cardiac Index (l/min/m2) 3.7 L/min/m2  Right Atrium Mean Pressure (mmHg) 5 mmHg   Right Ventricle Systolic Pressure (mmHg) 46 mmHg   Pulmonary Artery Mean Pressure (mmHg) 31 mmHg   Pulmonary Wedge Pressure (mmHg) 11 mmHg   Pulmonary Vascular Resistance (Wood units) 2.2 Wood units   He had a second evaluation for renal transplant at [facility] in 2017 but was denied because of his hypertension and atrial fibrillation.  His dry weight is 126kg, his BP is best there.    He was hospitalized in early 2018 for healthcare associated pneumonia  HPI Chief Complaint  Patient presents with  . Follow-up    pt notes increased sob with exertion but does note that he is exerting more.  wants to discuss meds d/t dialysis schedule changes.      Mr. Schill is doing dialysis at home now.  He feels great.  He says that when he finishes the session he feels much stronger, hsa more energy.  Compared to his prior three days per week sessions he has more energy now. He is doing 5 sessions a week now, spread out over 7 days.  He is measured weight is 126kg.   He says that his blood pressure is still low.  He doesn't feel like his blood pressure is as variable anymore though.  However his pressure is still low while not on his machine.  He says that when the pressure is low he feels a little lightheaded.      Past Medical History:  Diagnosis Date  . AICD  (automatic cardioverter/defibrillator) present    Medtronic   . Aortic stenosis    moderate AS by 08/2016 echo  . Arthritis    "knees" (08/05/2016)  . Atrial fibrillation ([place])   . ESRD (end stage renal disease) on dialysis ([place])    "M, T, W, T, F; NX stage hemodialysis; I do it at home" (08/05/2016)  . Great toe amputation status ([place])    status post left hallux amputation 08/01/14  . History of blood transfusion 2009   "S/P biopsy for prostate cancer check"  . Hypothyroidism (acquired)   . Mitral stenosis    moderate mitral stenosis  . Nonischemic cardiomyopathy ([place])   . Pneumonia 2014; 08/05/2016  . PONV (postoperative nausea and vomiting)   . Pulmonary HTN ([place])   . Thyroid cancer ([place]) 2011      Review of Systems  Constitutional: Negative for chills, fatigue and fever.  HENT: Negative for postnasal drip, rhinorrhea and sinus pressure.   Respiratory: Negative for cough, shortness of breath and wheezing.   Cardiovascular: Negative for chest pain, palpitations and leg swelling.       Objective:   Physical Exam Vitals:   01/27/17 0916  BP: 122/66  Pulse: 82  SpO2: 95%  Weight: 278 lb 9.6 oz (126.4 kg)  Height: 6\' 1"  (1.854 m)   RA  Gen: well appearing HENT: OP clear, TM's clear, neck supple  PULM: CTA B, normal percussion CV: RRR, systolic murmur RUSB, trace edema GI: BS+, soft, nontender Derm: no cyanosis or rash Psyche: normal mood and affect   6MW March 2017 6 minute walk test at [facility] m, O2 saturation 100% on room air  Echo: March 2017 echocardiogram [facility] RVSP 40 mmHg, LVEF 55% March 2018 echo [facility]: RVSP 34mm Hg  RHC April 2017 right heart catheterization [facility] cardiac index 3.7, right atrial pressure 5, RVSP 46, mean PA pressure 31, pulmonary capillary wedge pressure 11, pulmonary vascular resistance 2.2  Records from his visit with vascular surgery last month reviewed, he recently had an AV fistula placed      Assessment & Plan:   Pulmonary hypertension ([place])  Hypotension, unspecified hypotension type  Discussion: This is been a stable interval for Jaymie in terms of his pulmonary hypertension. His right heart catheterization in 2017 showed adequate control on Letairis and Adcirca. There is a difference between his echocardiogram findings from [place] in 2017 and are values here in 2018. However, clinically he said no suggestion of worsening symptoms so I think this is just a difference in technique between the 2 institutions.  He has some hypotension, however it has minimal impact on his quality of life so I see no reason to change his pulmonary hypertension regimen at this time.  Plan: For your pulmonary hypertension: Keep taking Letairis and Adcirca as you are doing Your liver function tests yesterday were fine, showed no evidence of liver involvement We will arrange for a 6 minute walk test I will follow-up the echocardiogram in 2019  For your low blood pressure: Considering the fact that this has minimal impact on your quality of life I see no reason to change Adcirca at this time  I will see you back in 6 months or sooner    Current Outpatient Prescriptions:  .  ambrisentan (LETAIRIS) 10 MG tablet, Take 1 tablet (10 mg total) by mouth daily., Disp: 30 tablet, Rfl: 11 .  amiodarone (PACERONE) 200 MG tablet, Take 200 mg by mouth every evening. , Disp: , Rfl:  .  atorvastatin (LIPITOR) 40 MG tablet, TAKE 1 TABLET BY MOUTH DAILY (Patient taking differently: TAKE 1 TABLET BY MOUTH DAILY IN THE EVENING), Disp: 90 tablet, Rfl: 3 .  cinacalcet (SENSIPAR) 90 MG tablet, Take 90 mg by mouth every evening. , Disp: , Rfl:  .  levothyroxine (SYNTHROID, LEVOTHROID) 300 MCG tablet, Take 300 mcg by mouth See admin instructions. TAKE 1 TABLET (300 MCG) DAILY EXCEPT SUNDAYS (DO NOT TAKE ANY SYNTHROID), Disp: , Rfl:  .  midodrine (PROAMATINE) 10 MG tablet, Take 1 tablet (10 mg total) by mouth 3 (three)  times daily., Disp: 90 tablet, Rfl: 6 .  tadalafil, PAH, (ADCIRCA) 20 MG tablet, 1 tab daily, Disp: 30 tablet, Rfl: 11 .  warfarin (COUMADIN) 6 MG tablet, Take 6-9 mg by mouth See admin instructions. Take 1.5 tablets (9 mg) by mouth in the evening, except take 1 tablet (6 mg) in the evening on Sunday., Disp: , Rfl: 3

## 2017-01-27 NOTE — Patient Instructions (Signed)
For your pulmonary hypertension: Keep taking Letairis and Adcirca as you are doing Your liver function tests yesterday were fine, showed no evidence of liver involvement We will arrange for a 6 minute walk test I will follow-up the echocardiogram in 2019  For your low blood pressure: Considering the fact that this has minimal impact on your quality of life I see no reason to change Adcirca at this time  I will see you back in 6 months or sooner

## 2017-01-28 ENCOUNTER — Encounter: Payer: Self-pay | Admitting: Cardiology

## 2017-01-28 NOTE — Progress Notes (Signed)
Defibrillator check on same day as visit with Dr. Percival Spanish. Normal device function, normal battery and lead parameters. Lead capture thresholds are relatively high, auto capture the right results identical with manual check in the clinic today. No episodes of VT or VF recorded. Optivol has been well above baseline for many months, but correlation with symptoms has been poor. Patient is on hemodialysis. Continue remote downloads every 3 months and yearly office visit.

## 2017-01-30 ENCOUNTER — Ambulatory Visit: Payer: Medicare Other

## 2017-02-02 ENCOUNTER — Ambulatory Visit: Payer: Medicare Other

## 2017-02-17 LAB — PROTIME-INR

## 2017-02-19 ENCOUNTER — Ambulatory Visit (INDEPENDENT_AMBULATORY_CARE_PROVIDER_SITE_OTHER): Payer: Medicare Other | Admitting: Podiatry

## 2017-02-19 ENCOUNTER — Ambulatory Visit (INDEPENDENT_AMBULATORY_CARE_PROVIDER_SITE_OTHER): Payer: Medicare Other | Admitting: Pharmacist Clinician (PhC)/ Clinical Pharmacy Specialist

## 2017-02-19 ENCOUNTER — Ambulatory Visit (INDEPENDENT_AMBULATORY_CARE_PROVIDER_SITE_OTHER): Payer: Medicare Other

## 2017-02-19 ENCOUNTER — Ambulatory Visit: Payer: Medicare Other

## 2017-02-19 DIAGNOSIS — M79675 Pain in left toe(s): Secondary | ICD-10-CM

## 2017-02-19 DIAGNOSIS — I480 Paroxysmal atrial fibrillation: Secondary | ICD-10-CM | POA: Diagnosis not present

## 2017-02-19 DIAGNOSIS — Z7901 Long term (current) use of anticoagulants: Secondary | ICD-10-CM | POA: Diagnosis not present

## 2017-02-19 DIAGNOSIS — M79674 Pain in right toe(s): Secondary | ICD-10-CM | POA: Diagnosis not present

## 2017-02-19 DIAGNOSIS — L84 Corns and callosities: Secondary | ICD-10-CM | POA: Diagnosis not present

## 2017-02-19 DIAGNOSIS — L97511 Non-pressure chronic ulcer of other part of right foot limited to breakdown of skin: Secondary | ICD-10-CM

## 2017-02-19 DIAGNOSIS — E1149 Type 2 diabetes mellitus with other diabetic neurological complication: Secondary | ICD-10-CM

## 2017-02-19 DIAGNOSIS — M779 Enthesopathy, unspecified: Secondary | ICD-10-CM

## 2017-02-19 DIAGNOSIS — B351 Tinea unguium: Secondary | ICD-10-CM | POA: Diagnosis not present

## 2017-02-19 LAB — POCT INR: INR: 1.9

## 2017-02-19 NOTE — Progress Notes (Signed)
Subjective: Mr. Mendizabal presents the office today for diabetic foot evaluation as well as for thick, painful, elongated toenails that he cannot trim himself as well as for painful corns to the right fifth toe. He also states he is started to notice some pain in the outside aspect of his feet with the right side worse than left and pointing to the fifth metatarsal base. He is not sure if this is a bone issue or if this is a callus issue. He denies any swelling or redness or any open sores. He said no recent treatment for this issue. He has no other concerns today. He denies any systemic complaints such as fevers, chills, nausea, vomiting. No calf pain, chest pain, shortness of breath.  Objective: AAO x3, NAD DP/PT pulses palpable bilaterally, CRT less than 3 seconds Protective sensation decreased with Simms Weinstein monofilament Hyperkeratotic lesion present right fifth toe. Upon debridement no underlying ulceration, drainage or any signs of infection. Bilateral fourth and fifth digit toenails are dystrophic, discolored, hypertrophic and there is irritation in shoe gear. Upon debridement there is no drainage or pus. No swelling or redness from the toenail sites. Tenderness overlying the fifth metatarsal bases bilaterally and there is hyperkeratotic tissue overlying this area with the right side worse than left. Upon debridement there is no underlying ulceration, drainage or any clinical signs of infection present. There was resolution of symptoms after debridement. There is no overlying edema, erythema, increase in warmth bilaterally. No open lesions or pre-ulcerative lesions.  No pain with calf compression, swelling, warmth, erythema  Assessment: Symptomatic onychomycosis 4, hyperkeratotic lesions 3  Plan: -All treatment options discussed with the patient including all alternatives, risks, complications.  -X-rays were obtained and reviewed. No evidence of acute fracture. Multiple digital  deformities are present. Midfoot arthritic changes are present. -Nails were sharply debrided 4 without complications or bleeding -Hyperkeratotic lesions debrided 3 without complications or bleeding. -Paperwork was completed today for precertification diabetic shoes and inserts. I will have him follow-up with [employer] for molding of inserts.  -Daily foot inspection. -Follow-up in 9 weeks or sooner if needed. Call any questions or concerns in the meantime.  Celesta Gentile, DPM

## 2017-02-24 ENCOUNTER — Ambulatory Visit: Payer: Medicare Other

## 2017-03-06 ENCOUNTER — Ambulatory Visit: Payer: Medicare Other | Admitting: Podiatry

## 2017-03-17 ENCOUNTER — Ambulatory Visit (INDEPENDENT_AMBULATORY_CARE_PROVIDER_SITE_OTHER): Payer: Medicare Other | Admitting: Pharmacist Clinician (PhC)/ Clinical Pharmacy Specialist

## 2017-03-17 DIAGNOSIS — Z7901 Long term (current) use of anticoagulants: Secondary | ICD-10-CM | POA: Diagnosis not present

## 2017-03-17 DIAGNOSIS — I480 Paroxysmal atrial fibrillation: Secondary | ICD-10-CM

## 2017-03-17 LAB — POCT INR: INR: 2.2

## 2017-03-20 ENCOUNTER — Ambulatory Visit (INDEPENDENT_AMBULATORY_CARE_PROVIDER_SITE_OTHER): Payer: Medicare Other | Admitting: Podiatry

## 2017-03-20 DIAGNOSIS — M779 Enthesopathy, unspecified: Secondary | ICD-10-CM

## 2017-03-20 DIAGNOSIS — Q828 Other specified congenital malformations of skin: Secondary | ICD-10-CM

## 2017-03-20 DIAGNOSIS — L84 Corns and callosities: Secondary | ICD-10-CM

## 2017-03-20 DIAGNOSIS — E1149 Type 2 diabetes mellitus with other diabetic neurological complication: Secondary | ICD-10-CM | POA: Diagnosis not present

## 2017-03-23 NOTE — Progress Notes (Signed)
Subjective: [name] presents the office today for concerns of pain in the aspect of both of his feet with the right side worse than left and he points along the area of the callus to the fifth metatarsal base.  He also states that he has a pulling sensation like a rubber band on the bottom of his foot.  He denies any recent injury or trauma he denies any open sores.  He denies any increase in swelling or redness to his feet.  He has noticed.  He has had no recent treatment for this. Denies any systemic complaints such as fevers, chills, nausea, vomiting. No acute changes since last appointment, and no other complaints at this time.   Objective: AAO x3, NAD DP/PT pulses palpable bilaterally, CRT less than 3 seconds Multiple digital amputations are present.  There is prominence of the fifth metatarsal base bilaterally the right side worse than left on the right side there is a thick hyperkeratotic tissue greater than left.  Upon debridement there is no underlying ulceration, drainage or any clinical signs of infection present.  Also upon dorsiflexion of the ankle there is a pulling sensation along the arch of the foot along the band of the plantar fascia.  Plantar fascia appears to be intact.  Achilles tendon intact.  There is no area pinpoint tenderness. No open lesions or pre-ulcerative lesions.  No pain with calf compression, swelling, warmth, erythema  Assessment: Symptomatic hyperkeratotic lesions bilaterally with plantar fasciitis  Plan: -All treatment options discussed with the patient including all alternatives, risks, complications.  -Hyperkeratotic lesions were sharply debrided x2 without any complications or bleeding.  Long-term I think he will do better when he gets his diabetic shoes and inserts.  She has had a right however we are still waiting for the custom inserts.  We will follow him once they come and hopefully this will help take pressure off these areas. -In regard to the pulling  sensation to his feet discussed professions.  Discussed stretching exercises as well as supportive shoes in the meantime until he gets diabetic shoes and inserts. -Patient encouraged to call the office with any questions, concerns, change in symptoms.   Adam [name] DPM

## 2017-04-10 ENCOUNTER — Ambulatory Visit (INDEPENDENT_AMBULATORY_CARE_PROVIDER_SITE_OTHER): Payer: Medicare Other | Admitting: Endocrinology

## 2017-04-10 ENCOUNTER — Encounter: Payer: Self-pay | Admitting: Endocrinology

## 2017-04-10 VITALS — BP 102/60 | HR 86 | Wt 278.0 lb

## 2017-04-10 DIAGNOSIS — R590 Localized enlarged lymph nodes: Secondary | ICD-10-CM | POA: Insufficient documentation

## 2017-04-10 DIAGNOSIS — Z85850 Personal history of malignant neoplasm of thyroid: Secondary | ICD-10-CM | POA: Diagnosis not present

## 2017-04-10 NOTE — Progress Notes (Signed)
Subjective:    Patient ID: Adam Alm., male    DOB: 1960/05/31, 56 y.o.   MRN: [id number]  HPI Pt returns for f/u of thyroid cancer, with this chronology: 2011: thyroidectomy in [place]; he does not know cell type; preop US showed 1.5 cm right lobe nodule, so stage 1 is presumed.  2011-2017: serial labs and ultrasounds, in [place].  He says no recurrence has been found.  He has never had RAI, due to hemodialysis.   3/18:US: 0.8 x 1.3 x 0.6 cm hypoechoic mass in the left thyroid bed.   3/18: chest CT: Shotty mediastinal lymph nodes.  4/18: left thyroid bed bx: FEW INFLAMMATORY CELLS.   Denies neck swelling or lump.   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  Past Medical History:  Diagnosis Date  . AICD (automatic cardioverter/defibrillator) present    Medtronic   . Aortic stenosis    moderate AS by 08/2016 echo  . Arthritis    "knees" (08/05/2016)  . Atrial fibrillation ([place])   . ESRD (end stage renal disease) on dialysis ([place])    "M, T, W, T, F; NX stage hemodialysis; I do it at home" (08/05/2016)  . Great toe amputation status ([place])    status post left hallux amputation 08/01/14  . History of blood transfusion 2009   "S/P biopsy for prostate cancer check"  . Hypothyroidism (acquired)   . Mitral stenosis    moderate mitral stenosis  . Nonischemic cardiomyopathy ([place])   . Pneumonia 2014; 08/05/2016  . PONV (postoperative nausea and vomiting)   . Pulmonary HTN ([place])   . Thyroid cancer ([place]) 2011    Past Surgical History:  Procedure Laterality Date  . CARDIAC CATHETERIZATION Right 2017  . CARDIAC DEFIBRILLATOR PLACEMENT  2009  . DIALYSIS FISTULA CREATION Left 2003   "upper arm"  . HERNIA REPAIR    . INSERTION OF DIALYSIS CATHETER N/A 11/11/2016   Procedure: INSERTION OF TUNNELED DIALYSIS CATHETER;  Surgeon: Angelia Mould, MD;  Location: [facility];  Service: Vascular;  Laterality: N/A;  .  KIDNEY TRANSPLANT  2009  . KNEE SURGERY Bilateral 2000s   "drained fluid"  . LAPAROSCOPIC GASTRIC BANDING  2006  . PROSTATE BIOPSY  2009  . REVISON OF ARTERIOVENOUS FISTULA Left 2/97/9892   Procedure: PLICATION OF ARTERIOVENOUS FISTULA ANEURYSM;  Surgeon: Elam Dutch, MD;  Location: [facility];  Service: Vascular;  Laterality: Left;  . REVISON OF ARTERIOVENOUS FISTULA Left 11/10/2016   Procedure: REVISON OF ARTERIOVENOUS FISTULA - LEFT UPPER ARM;  Surgeon: Elam Dutch, MD;  Location: [facility];  Service: Vascular;  Laterality: Left;  . THROMBECTOMY W/ EMBOLECTOMY Left 12/25/2014   Procedure: THROMBECTOMY ARTERIOVENOUS FISTULA;  Surgeon: Elam Dutch, MD;  Location: [facility];  Service: Vascular;  Laterality: Left;  . THYROIDECTOMY  2011  . TOE AMPUTATION Bilateral     right 1st and  2nd digits; left 1st and 3rd digits"  . UMBILICAL HERNIA REPAIR  X 2    Social History   Socioeconomic History  . Marital status: Married    Spouse name: Not on file  . Number of children: 1  . Years of education: Not on file  . Highest education level: Not on file  Social Needs  . Financial resource strain: Not on file  . Food insecurity - worry: Not on file  . Food insecurity - inability: Not on file  .  Transportation needs - medical: Not on file  . Transportation needs - non-medical: Not on file  Occupational History  . Not on file  Tobacco Use  . Smoking status: Never Smoker  . Smokeless tobacco: Never Used  Substance and Sexual Activity  . Alcohol use: Yes    Alcohol/week: 0.0 oz    Comment: 08/05/2016 "might have 1 shot/month of liquor"  . Drug use: No  . Sexual activity: Yes  Other Topics Concern  . Not on file  Social History Narrative   Retired Archivist.      Current Outpatient Medications on File Prior to Visit  Medication Sig Dispense Refill  . ambrisentan (LETAIRIS) 10 MG tablet Take 1 tablet (10 mg total) by mouth daily. 30 tablet 11  . amiodarone (PACERONE)  200 MG tablet Take 200 mg by mouth every evening.     Marland Kitchen atorvastatin (LIPITOR) 40 MG tablet TAKE 1 TABLET BY MOUTH DAILY (Patient taking differently: TAKE 1 TABLET BY MOUTH DAILY IN THE EVENING) 90 tablet 3  . cinacalcet (SENSIPAR) 90 MG tablet Take 90 mg by mouth every evening.     Marland Kitchen levothyroxine (SYNTHROID, LEVOTHROID) 300 MCG tablet Take 300 mcg by mouth See admin instructions. TAKE 1 TABLET (300 MCG) DAILY EXCEPT SUNDAYS (DO NOT TAKE ANY SYNTHROID)    . midodrine (PROAMATINE) 10 MG tablet Take 1 tablet (10 mg total) by mouth 3 (three) times daily. 90 tablet 6  . tadalafil, PAH, (ADCIRCA) 20 MG tablet 1 tab daily 30 tablet 11  . warfarin (COUMADIN) 6 MG tablet Take 6-9 mg by mouth See admin instructions. Take 1.5 tablets (9 mg) by mouth in the evening, except take 1 tablet (6 mg) in the evening on Sunday.  3   No current facility-administered medications on file prior to visit.     Allergies  Allergen Reactions  . Enalaprilat Swelling    Mouth swelling  . Vasotec [Enalapril] Swelling    MOUTH SWELLING  . Ivp Dye [Iodinated Diagnostic Agents] Nausea And Vomiting    Family History  Problem Relation Age of Onset  . Heart failure Mother   . Hypertension Mother   . CAD Mother 15  . Emphysema Mother   . Hypertension Father   . Kidney failure Father     BP 102/60 (BP Location: Right Arm, Patient Position: Sitting, Cuff Size: Normal)   Pulse 86   Wt 278 lb (126.1 kg)   SpO2 96%   BMI 36.68 kg/m   Review of Systems Denies cramps and numbness.     Objective:   Physical Exam VITAL SIGNS:  See vs page GENERAL: no distress Neck: a healed scar is present.  I do not appreciate a nodule in the thyroid or elsewhere in the neck    Lab Results  Component Value Date   TSH 3.730 01/26/2017      Assessment & Plan:  Stage-1 differentiated thyroid cancer. 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  Patient Instructions  Let's recheck the ultrasound.  you will receive a phone call, about a day and time for an appointment. When you next see Dr Lake Bells, please ask about the lymph nodes. Please continue the same levothyroxine.  Please come back for a follow-up appointment in 1 year.

## 2017-04-10 NOTE — Patient Instructions (Addendum)
Let's recheck the ultrasound.  you will receive a phone call, about a day and time for an appointment. When you next see Dr Lake Bells, please ask about the lymph nodes. Please continue the same levothyroxine.  Please come back for a follow-up appointment in 1 year.

## 2017-04-15 ENCOUNTER — Ambulatory Visit (INDEPENDENT_AMBULATORY_CARE_PROVIDER_SITE_OTHER): Payer: Medicare Other | Admitting: Pharmacist Clinician (PhC)/ Clinical Pharmacy Specialist

## 2017-04-15 DIAGNOSIS — I480 Paroxysmal atrial fibrillation: Secondary | ICD-10-CM | POA: Diagnosis not present

## 2017-04-15 DIAGNOSIS — Z7901 Long term (current) use of anticoagulants: Secondary | ICD-10-CM | POA: Diagnosis not present

## 2017-04-15 LAB — POCT INR: INR: 5

## 2017-04-20 ENCOUNTER — Ambulatory Visit (INDEPENDENT_AMBULATORY_CARE_PROVIDER_SITE_OTHER): Payer: Medicare Other | Admitting: *Deleted

## 2017-04-20 DIAGNOSIS — I428 Other cardiomyopathies: Secondary | ICD-10-CM | POA: Diagnosis not present

## 2017-04-20 NOTE — Progress Notes (Signed)
Remote ICD transmission.   

## 2017-04-21 ENCOUNTER — Ambulatory Visit
Admission: RE | Admit: 2017-04-21 | Discharge: 2017-04-21 | Disposition: A | Discharge status: Home / Self Care | Payer: Medicare Other | Source: Ambulatory Visit | Attending: Endocrinology | Admitting: Endocrinology

## 2017-04-21 DIAGNOSIS — Z85850 Personal history of malignant neoplasm of thyroid: Secondary | ICD-10-CM

## 2017-04-23 ENCOUNTER — Encounter: Payer: Self-pay | Admitting: Podiatry

## 2017-04-23 ENCOUNTER — Ambulatory Visit (INDEPENDENT_AMBULATORY_CARE_PROVIDER_SITE_OTHER): Payer: Medicare Other | Admitting: Podiatry

## 2017-04-23 DIAGNOSIS — M79675 Pain in left toe(s): Secondary | ICD-10-CM | POA: Diagnosis not present

## 2017-04-23 DIAGNOSIS — B351 Tinea unguium: Secondary | ICD-10-CM | POA: Diagnosis not present

## 2017-04-23 DIAGNOSIS — M79674 Pain in right toe(s): Secondary | ICD-10-CM

## 2017-04-23 DIAGNOSIS — L84 Corns and callosities: Secondary | ICD-10-CM | POA: Diagnosis not present

## 2017-04-23 DIAGNOSIS — E1149 Type 2 diabetes mellitus with other diabetic neurological complication: Secondary | ICD-10-CM

## 2017-04-23 LAB — CUP PACEART REMOTE DEVICE CHECK
Battery Remaining Longevity: 101 mo
Battery Voltage: 3 V
Brady Statistic AP VP Percent: 0.02 %
Brady Statistic RA Percent Paced: 0.2 %
Brady Statistic RV Percent Paced: 0.22 %
HighPow Impedance: 47 Ohm
HighPow Impedance: 53 Ohm
Implantable Lead Implant Date: 20090109
Implantable Lead Implant Date: 20090109
Implantable Lead Location: 753859
Implantable Pulse Generator Implant Date: 20160205
Lead Channel Impedance Value: 1083 Ohm
Lead Channel Impedance Value: 323 Ohm
Lead Channel Impedance Value: 570 Ohm
Lead Channel Pacing Threshold Pulse Width: 0.4 ms
Lead Channel Sensing Intrinsic Amplitude: 1.875 mV
Lead Channel Sensing Intrinsic Amplitude: 1.875 mV
Lead Channel Sensing Intrinsic Amplitude: 6.5 mV
Lead Channel Sensing Intrinsic Amplitude: 6.5 mV
Lead Channel Setting Pacing Amplitude: 2.75 V
Lead Channel Setting Pacing Pulse Width: 0.4 ms
Lead Channel Setting Sensing Sensitivity: 0.3 mV
MDC IDC LEAD LOCATION: 753860
MDC IDC MSMT LEADCHNL RA PACING THRESHOLD AMPLITUDE: 1.75 V
MDC IDC MSMT LEADCHNL RV PACING THRESHOLD AMPLITUDE: 2.375 V
MDC IDC MSMT LEADCHNL RV PACING THRESHOLD PULSEWIDTH: 0.4 ms
MDC IDC SESS DTM: 20181210124223
MDC IDC SET LEADCHNL RV PACING AMPLITUDE: 4.75 V
MDC IDC STAT BRADY AP VS PERCENT: 0.17 %
MDC IDC STAT BRADY AS VP PERCENT: 0.17 %
MDC IDC STAT BRADY AS VS PERCENT: 99.64 %

## 2017-04-24 ENCOUNTER — Encounter: Payer: Self-pay | Admitting: Cardiology

## 2017-04-28 LAB — CUP PACEART INCLINIC DEVICE CHECK
Battery Remaining Longevity: 106 mo
Brady Statistic AP VS Percent: 0.62 %
Brady Statistic AS VP Percent: 0.17 %
Brady Statistic AS VS Percent: 99.19 %
Brady Statistic RV Percent Paced: 0.2 %
HighPow Impedance: 41 Ohm
HighPow Impedance: 46 Ohm
Implantable Lead Implant Date: 20090109
Implantable Lead Location: 753859
Implantable Lead Location: 753860
Implantable Pulse Generator Implant Date: 20160205
Lead Channel Impedance Value: 950 Ohm
Lead Channel Pacing Threshold Amplitude: 2.25 V
Lead Channel Pacing Threshold Amplitude: 2.375 V
Lead Channel Sensing Intrinsic Amplitude: 1.375 mV
Lead Channel Sensing Intrinsic Amplitude: 1.375 mV
Lead Channel Sensing Intrinsic Amplitude: 5.375 mV
Lead Channel Sensing Intrinsic Amplitude: 5.375 mV
Lead Channel Setting Pacing Amplitude: 3.75 V
Lead Channel Setting Pacing Amplitude: 4.75 V
Lead Channel Setting Pacing Pulse Width: 0.4 ms
MDC IDC LEAD IMPLANT DT: 20090109
MDC IDC MSMT BATTERY VOLTAGE: 3 V
MDC IDC MSMT LEADCHNL RA IMPEDANCE VALUE: 532 Ohm
MDC IDC MSMT LEADCHNL RA PACING THRESHOLD PULSEWIDTH: 0.4 ms
MDC IDC MSMT LEADCHNL RV IMPEDANCE VALUE: 304 Ohm
MDC IDC MSMT LEADCHNL RV PACING THRESHOLD PULSEWIDTH: 0.4 ms
MDC IDC SESS DTM: 20180917132005
MDC IDC SET LEADCHNL RV SENSING SENSITIVITY: 0.3 mV
MDC IDC STAT BRADY AP VP PERCENT: 0.02 %
MDC IDC STAT BRADY RA PERCENT PACED: 0.64 %

## 2017-04-28 NOTE — Progress Notes (Signed)
Subjective: Mr. Caspers presents the office today for diabetic foot evaluation as well as for thick, painful, elongated toenails that he cannot trim himself as well as for painful corns to the right fifth toe and to the outside aspects of both of his feet with the right worse than the left.  He is still awaiting diabetic shoes and inserts.  He denies any increase in swelling to his feet he denies any drainage or any redness or warmth to his feet.  He has no other concerns today. He denies any systemic complaints such as fevers, chills, nausea, vomiting. No calf pain, chest pain, shortness of breath.  Objective: AAO x3, NAD DP/PT pulses palpable bilaterally, CRT less than 3 seconds Protective sensation decreased with Simms Weinstein monofilament Hyperkeratotic lesion present right fifth toe as well as the fifth metatarsal base bilaterally the right side worse than left. Upon debridement no underlying ulceration, drainage or any signs of infection. Bilateral fourth and fifth digit toenails are dystrophic, discolored, hypertrophic and there is irritation in shoe gear. Upon debridement there is no drainage or pus. No swelling or redness from the toenail sites. There is no overlying edema, erythema, increase in warmth bilaterally. No open lesions or pre-ulcerative lesions.  No pain with calf compression, swelling, warmth, erythema  Assessment: Symptomatic onychomycosis 4, hyperkeratotic lesions 3  Plan: -All treatment options discussed with the patient including all alternatives, risks, complications.  -X-rays were obtained and reviewed. No evidence of acute fracture. Multiple digital deformities are present. Midfoot arthritic changes are present. -Nails were sharply debrided 4 without complications or bleeding -Hyperkeratotic lesions debrided 3 without complications or bleeding. -Awaiting diabetic shoes and inserts.  She is a right according to the inserts. -Daily foot inspection. -Follow-up in 9  weeks or sooner if needed. Call any questions or concerns in the meantime.  Celesta Gentile, DPM

## 2017-04-29 ENCOUNTER — Other Ambulatory Visit: Payer: Self-pay | Admitting: Cardiology

## 2017-05-01 ENCOUNTER — Ambulatory Visit (INDEPENDENT_AMBULATORY_CARE_PROVIDER_SITE_OTHER): Payer: Medicare Other | Admitting: Pharmacist Clinician (PhC)/ Clinical Pharmacy Specialist

## 2017-05-01 DIAGNOSIS — I480 Paroxysmal atrial fibrillation: Secondary | ICD-10-CM

## 2017-05-01 DIAGNOSIS — Z7901 Long term (current) use of anticoagulants: Secondary | ICD-10-CM

## 2017-05-01 LAB — POCT INR: INR: 2.9

## 2017-05-01 NOTE — Patient Instructions (Signed)
Description   Continue taking 1 tablets on Sundays and 1.5 tablets ALL other days of the week.  repeat INR in 4 weeks

## 2017-05-06 ENCOUNTER — Other Ambulatory Visit: Payer: Self-pay | Admitting: Family Medicine

## 2017-05-06 MED ORDER — FLUTICASONE PROPIONATE 50 MCG/ACT NA SUSP: 2.0000 | Freq: Every day | NASAL | 6 refills | Status: DC

## 2017-05-07 ENCOUNTER — Other Ambulatory Visit: Payer: Self-pay | Admitting: Cardiovascular Disease

## 2017-05-12 DIAGNOSIS — E8770 Fluid overload, unspecified: Secondary | ICD-10-CM | POA: Diagnosis not present

## 2017-05-12 DIAGNOSIS — Z79899 Other long term (current) drug therapy: Secondary | ICD-10-CM | POA: Diagnosis not present

## 2017-05-12 DIAGNOSIS — N2589 Other disorders resulting from impaired renal tubular function: Secondary | ICD-10-CM | POA: Diagnosis not present

## 2017-05-12 DIAGNOSIS — D631 Anemia in chronic kidney disease: Secondary | ICD-10-CM | POA: Diagnosis not present

## 2017-05-12 DIAGNOSIS — E1129 Type 2 diabetes mellitus with other diabetic kidney complication: Secondary | ICD-10-CM | POA: Diagnosis not present

## 2017-05-12 DIAGNOSIS — N2581 Secondary hyperparathyroidism of renal origin: Secondary | ICD-10-CM | POA: Diagnosis not present

## 2017-05-12 DIAGNOSIS — E7849 Other hyperlipidemia: Secondary | ICD-10-CM | POA: Diagnosis not present

## 2017-05-12 DIAGNOSIS — E440 Moderate protein-calorie malnutrition: Secondary | ICD-10-CM | POA: Diagnosis not present

## 2017-05-12 DIAGNOSIS — N186 End stage renal disease: Secondary | ICD-10-CM | POA: Diagnosis not present

## 2017-05-19 ENCOUNTER — Ambulatory Visit (INDEPENDENT_AMBULATORY_CARE_PROVIDER_SITE_OTHER): Payer: Medicare HMO | Admitting: Orthotics

## 2017-05-19 DIAGNOSIS — L97511 Non-pressure chronic ulcer of other part of right foot limited to breakdown of skin: Secondary | ICD-10-CM

## 2017-05-19 DIAGNOSIS — E1149 Type 2 diabetes mellitus with other diabetic neurological complication: Secondary | ICD-10-CM

## 2017-05-19 DIAGNOSIS — L84 Corns and callosities: Secondary | ICD-10-CM | POA: Diagnosis not present

## 2017-05-19 DIAGNOSIS — Z899 Acquired absence of limb, unspecified: Secondary | ICD-10-CM

## 2017-05-19 DIAGNOSIS — R0989 Other specified symptoms and signs involving the circulatory and respiratory systems: Secondary | ICD-10-CM

## 2017-05-19 DIAGNOSIS — I739 Peripheral vascular disease, unspecified: Secondary | ICD-10-CM

## 2017-05-19 NOTE — Progress Notes (Signed)
Patient came in today to pick up toe fillers bilateral (and also comp shoes); patient was assessed with shoes and socks off to determine proper alignment with fillers and to discover if there would be any rubbing/irritation residual tissue.  All was good; patient was advised repeatedly to check on a daily basis for skin breakdown and/or irritation.

## 2017-05-26 ENCOUNTER — Ambulatory Visit: Payer: Medicare Other | Admitting: Pulmonary Disease

## 2017-05-26 NOTE — Progress Notes (Deleted)
Subjective:    Patient ID: Adam Alm., male    DOB: [birth year], 57 y.o.   MRN: [id number]  Synopsis: Diagnosed with pulmonary hypertension in 2012. 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 Unfortunately his kidney transplant failed for uncertain reasons by 4193. Was diagnosed with pulmonary hypertension by 2012 in [place]. Was started on [place] in [place] and moved to [place] not long afterwards. Developed atrial fibrillation around 2009, had a defibrillator placed.  08/2015 RHC [facility]: Cardiac Index (l/min/m2) 3.7 L/min/m2  Right Atrium Mean Pressure (mmHg) 5 mmHg   Right Ventricle Systolic Pressure (mmHg) 46 mmHg   Pulmonary Artery Mean Pressure (mmHg) 31 mmHg   Pulmonary Wedge Pressure (mmHg) 11 mmHg   Pulmonary Vascular Resistance (Wood units) 2.2 Wood units   He had a second evaluation for renal transplant at [facility] in 2017 but was denied because of his hypertension and atrial fibrillation.  His dry weight is 126kg, his BP is best there.    He was hospitalized in early 2018 for healthcare associated pneumonia  HPI No chief complaint on file.   ***???last 6MW?   Past Medical History:  Diagnosis Date  . AICD (automatic cardioverter/defibrillator) present    Medtronic   . Aortic stenosis    moderate AS by 08/2016 echo  . Arthritis    "knees" (08/05/2016)  . Atrial fibrillation ([place])   . ESRD (end stage renal disease) on dialysis ([place])    "M, T, W, T, F; NX stage hemodialysis; I do it at home" (08/05/2016)  . Great toe amputation status ([place])    status post left hallux amputation 08/01/14  . History of blood transfusion 2009   "S/P biopsy for prostate cancer check"  . Hypothyroidism (acquired)   . Mitral stenosis    moderate mitral stenosis  . Nonischemic cardiomyopathy ([place])   . Pneumonia 2014; 08/05/2016  . PONV (postoperative nausea and vomiting)   . Pulmonary HTN ([place])   . Thyroid cancer ([place])  2011      Review of Systems  Constitutional: Negative for chills, fatigue and fever.  HENT: Negative for postnasal drip, rhinorrhea and sinus pressure.   Respiratory: Negative for cough, shortness of breath and wheezing.   Cardiovascular: Negative for chest pain, palpitations and leg swelling.       Objective:   Physical Exam There were no vitals filed for this visit. RA  Gen: well appearing HENT: OP clear, TM's clear, neck supple PULM: CTA B, normal percussion CV: RRR, systolic murmur RUSB, trace edema GI: BS+, soft, nontender Derm: no cyanosis or rash Psyche: normal mood and affect   6MW March 2017 6 minute walk test at [facility] 385 m, O2 saturation 100% on room air  Echo: March 2017 echocardiogram [facility] RVSP 40 mmHg, LVEF 55% March 2018 echo [facility]: RVSP 52mm Hg  RHC April 2017 right heart catheterization [facility] cardiac index 3.7, right atrial pressure 5, RVSP 46, mean PA pressure 31, pulmonary capillary wedge pressure 11, pulmonary vascular resistance 2.2      Assessment & Plan:   No diagnosis found.  ***    Current Outpatient Medications:  .  ambrisentan (LETAIRIS) 10 MG tablet, Take 1 tablet (10 mg total) by mouth daily., Disp: 30 tablet, Rfl: 11 .  amiodarone (PACERONE) 200 MG tablet, Take 200 mg by mouth every evening. , Disp: , Rfl:  .  atorvastatin (LIPITOR) 40 MG tablet, TAKE 1 TABLET BY  MOUTH DAILY (Patient taking differently: TAKE 1 TABLET BY MOUTH DAILY IN THE EVENING), Disp: 90 tablet, Rfl: 3 .  cinacalcet (SENSIPAR) 90 MG tablet, Take 90 mg by mouth every evening. , Disp: , Rfl:  .  fluticasone (FLONASE) 50 MCG/ACT nasal spray, Place 2 sprays into both nostrils daily., Disp: 16 g, Rfl: 6 .  levothyroxine (SYNTHROID, LEVOTHROID) 300 MCG tablet, Take 300 mcg by mouth See admin instructions. TAKE 1 TABLET (300 MCG) DAILY EXCEPT SUNDAYS (DO NOT TAKE ANY SYNTHROID), Disp: , Rfl:  .  midodrine (PROAMATINE) 10 MG tablet, Take 1  tablet (10 mg total) by mouth 3 (three) times daily., Disp: 90 tablet, Rfl: 6 .  tadalafil, PAH, (ADCIRCA) 20 MG tablet, 1 tab daily, Disp: 30 tablet, Rfl: 11 .  warfarin (COUMADIN) 6 MG tablet, Take 6-9 mg by mouth See admin instructions. Take 1.5 tablets (9 mg) by mouth in the evening, except take 1 tablet (6 mg) in the evening on Sunday., Disp: , Rfl: 3 .  warfarin (COUMADIN) 6 MG tablet, TAKE 1 TO 1.5 TABLETS BY MOUTH DAILY AS DIRECTED BY COUMADIN CLINIC, Disp: 50 tablet, Rfl: 2

## 2017-05-30 DIAGNOSIS — N186 End stage renal disease: Secondary | ICD-10-CM | POA: Diagnosis not present

## 2017-05-30 DIAGNOSIS — N2581 Secondary hyperparathyroidism of renal origin: Secondary | ICD-10-CM | POA: Diagnosis not present

## 2017-06-01 ENCOUNTER — Ambulatory Visit (INDEPENDENT_AMBULATORY_CARE_PROVIDER_SITE_OTHER): Payer: Medicare HMO | Admitting: Pharmacist Clinician (PhC)/ Clinical Pharmacy Specialist

## 2017-06-01 DIAGNOSIS — Z7901 Long term (current) use of anticoagulants: Secondary | ICD-10-CM | POA: Diagnosis not present

## 2017-06-01 DIAGNOSIS — I480 Paroxysmal atrial fibrillation: Secondary | ICD-10-CM

## 2017-06-01 LAB — POCT INR: INR: 2.7

## 2017-06-01 NOTE — Patient Instructions (Signed)
Description   Continue taking 1 tablets on Sundays and 1.5 tablets ALL other days of the week.  Repeat INR in 4 weeks.

## 2017-06-09 ENCOUNTER — Telehealth: Payer: Self-pay | Admitting: Endocrinology

## 2017-06-09 ENCOUNTER — Other Ambulatory Visit: Payer: Self-pay

## 2017-06-09 ENCOUNTER — Other Ambulatory Visit: Payer: Self-pay | Admitting: Endocrinology

## 2017-06-09 MED ORDER — LEVOTHYROXINE SODIUM 300 MCG PO TABS: 300.0000 ug | ORAL_TABLET | Freq: Every day | ORAL | 3 refills | Status: DC

## 2017-06-09 NOTE — Telephone Encounter (Signed)
please call patient: We got recent lab result.  Please increase levothyroxine to 300 mcg every day

## 2017-06-09 NOTE — Telephone Encounter (Signed)
I have called and notified patient.

## 2017-06-11 DIAGNOSIS — Z992 Dependence on renal dialysis: Secondary | ICD-10-CM | POA: Diagnosis not present

## 2017-06-11 DIAGNOSIS — E1129 Type 2 diabetes mellitus with other diabetic kidney complication: Secondary | ICD-10-CM | POA: Diagnosis not present

## 2017-06-11 DIAGNOSIS — N186 End stage renal disease: Secondary | ICD-10-CM | POA: Diagnosis not present

## 2017-06-12 DIAGNOSIS — D631 Anemia in chronic kidney disease: Secondary | ICD-10-CM | POA: Diagnosis not present

## 2017-06-12 DIAGNOSIS — K7689 Other specified diseases of liver: Secondary | ICD-10-CM | POA: Diagnosis not present

## 2017-06-12 DIAGNOSIS — E440 Moderate protein-calorie malnutrition: Secondary | ICD-10-CM | POA: Diagnosis not present

## 2017-06-12 DIAGNOSIS — Z79899 Other long term (current) drug therapy: Secondary | ICD-10-CM | POA: Diagnosis not present

## 2017-06-12 DIAGNOSIS — D509 Iron deficiency anemia, unspecified: Secondary | ICD-10-CM | POA: Diagnosis not present

## 2017-06-12 DIAGNOSIS — E039 Hypothyroidism, unspecified: Secondary | ICD-10-CM | POA: Diagnosis not present

## 2017-06-12 DIAGNOSIS — E8770 Fluid overload, unspecified: Secondary | ICD-10-CM | POA: Diagnosis not present

## 2017-06-12 DIAGNOSIS — N186 End stage renal disease: Secondary | ICD-10-CM | POA: Diagnosis not present

## 2017-06-12 DIAGNOSIS — N2581 Secondary hyperparathyroidism of renal origin: Secondary | ICD-10-CM | POA: Diagnosis not present

## 2017-06-15 ENCOUNTER — Other Ambulatory Visit: Payer: Self-pay | Admitting: *Deleted

## 2017-06-15 MED ORDER — ATORVASTATIN CALCIUM 40 MG PO TABS: 40.0000 mg | ORAL_TABLET | Freq: Every evening | ORAL | 2 refills | Status: DC

## 2017-06-24 DIAGNOSIS — M1711 Unilateral primary osteoarthritis, right knee: Secondary | ICD-10-CM | POA: Diagnosis not present

## 2017-06-24 DIAGNOSIS — M1712 Unilateral primary osteoarthritis, left knee: Secondary | ICD-10-CM | POA: Diagnosis not present

## 2017-06-28 DIAGNOSIS — N2581 Secondary hyperparathyroidism of renal origin: Secondary | ICD-10-CM | POA: Diagnosis not present

## 2017-06-28 DIAGNOSIS — N186 End stage renal disease: Secondary | ICD-10-CM | POA: Diagnosis not present

## 2017-06-28 DIAGNOSIS — E8770 Fluid overload, unspecified: Secondary | ICD-10-CM | POA: Diagnosis not present

## 2017-06-29 ENCOUNTER — Ambulatory Visit (INDEPENDENT_AMBULATORY_CARE_PROVIDER_SITE_OTHER): Payer: Medicare HMO | Admitting: Pharmacist

## 2017-06-29 DIAGNOSIS — Z7901 Long term (current) use of anticoagulants: Secondary | ICD-10-CM

## 2017-06-29 DIAGNOSIS — I480 Paroxysmal atrial fibrillation: Secondary | ICD-10-CM

## 2017-06-29 LAB — POCT INR: INR: 2

## 2017-07-07 ENCOUNTER — Ambulatory Visit: Payer: Medicare Other | Admitting: Pulmonary Disease

## 2017-07-07 NOTE — Progress Notes (Deleted)
Subjective:    Patient ID: Adam Alm., male    DOB: October 22, 1960, 57 y.o.   MRN: [id number]  Synopsis: Diagnosed with pulmonary hypertension in 2012. 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 Unfortunately his kidney transplant failed for uncertain reasons by 5366. Was diagnosed with pulmonary hypertension by 2012 in [place]. Was started on [place] in [place] and moved to [place] not long afterwards. Developed atrial fibrillation around 2009, had a defibrillator placed.  08/2015 RHC [facility]: Cardiac Index (l/min/m2) 3.7 L/min/m2  Right Atrium Mean Pressure (mmHg) 5 mmHg   Right Ventricle Systolic Pressure (mmHg) 46 mmHg   Pulmonary Artery Mean Pressure (mmHg) 31 mmHg   Pulmonary Wedge Pressure (mmHg) 11 mmHg   Pulmonary Vascular Resistance (Wood units) 2.2 Wood units   He had a second evaluation for renal transplant at [facility] in 2017 but was denied because of his hypertension and atrial fibrillation.  His dry weight is 126kg, his BP is best there.    He was hospitalized in early 2018 for healthcare associated pneumonia  HPI No chief complaint on file.   ***   Past Medical History:  Diagnosis Date  . AICD (automatic cardioverter/defibrillator) present    Medtronic   . Aortic stenosis    moderate AS by 08/2016 echo  . Arthritis    "knees" (08/05/2016)  . Atrial fibrillation ([place])   . ESRD (end stage renal disease) on dialysis ([place])    "M, T, W, T, F; NX stage hemodialysis; I do it at home" (08/05/2016)  . Great toe amputation status ([place])    status post left hallux amputation 08/01/14  . History of blood transfusion 2009   "S/P biopsy for prostate cancer check"  . Hypothyroidism (acquired)   . Mitral stenosis    moderate mitral stenosis  . Nonischemic cardiomyopathy ([place])   . Pneumonia 2014; 08/05/2016  . PONV (postoperative nausea and vomiting)   . Pulmonary HTN ([place])   . Thyroid cancer ([place]) 2011       Review of Systems  Constitutional: Negative for chills, fatigue and fever.  HENT: Negative for postnasal drip, rhinorrhea and sinus pressure.   Respiratory: Negative for cough, shortness of breath and wheezing.   Cardiovascular: Negative for chest pain, palpitations and leg swelling.       Objective:   Physical Exam There were no vitals filed for this visit. RA  ***  6MW March 2017 6 minute walk test at [facility] 385 m, O2 saturation 100% on room air  Echo: March 2017 echocardiogram [facility] RVSP 40 mmHg, LVEF 55% March 2018 echo [facility]: RVSP 22mm Hg  RHC April 2017 right heart catheterization [facility] cardiac index 3.7, right atrial pressure 5, RVSP 46, mean PA pressure 31, pulmonary capillary wedge pressure 11, pulmonary vascular resistance 2.2  Records from his visit with vascular surgery last month reviewed, he recently had an AV fistula placed    Assessment & Plan:   No diagnosis found.  ***    Current Outpatient Medications:  .  ambrisentan (LETAIRIS) 10 MG tablet, Take 1 tablet (10 mg total) by mouth daily., Disp: 30 tablet, Rfl: 11 .  amiodarone (PACERONE) 200 MG tablet, Take 200 mg by mouth every evening. , Disp: , Rfl:  .  atorvastatin (LIPITOR) 40 MG tablet, Take 1 tablet (40 mg total) by mouth every evening., Disp: 30 tablet, Rfl: 2 .  cinacalcet (SENSIPAR) 90 MG tablet, Take 90 mg  by mouth every evening. , Disp: , Rfl:  .  fluticasone (FLONASE) 50 MCG/ACT nasal spray, Place 2 sprays into both nostrils daily., Disp: 16 g, Rfl: 6 .  levothyroxine (SYNTHROID, LEVOTHROID) 300 MCG tablet, Take 1 tablet (300 mcg total) by mouth daily., Disp: 90 tablet, Rfl: 3 .  midodrine (PROAMATINE) 10 MG tablet, Take 1 tablet (10 mg total) by mouth 3 (three) times daily., Disp: 90 tablet, Rfl: 6 .  tadalafil, PAH, (ADCIRCA) 20 MG tablet, 1 tab daily, Disp: 30 tablet, Rfl: 11 .  warfarin (COUMADIN) 6 MG tablet, Take 6-9 mg by mouth See admin  instructions. Take 1.5 tablets (9 mg) by mouth in the evening, except take 1 tablet (6 mg) in the evening on Sunday., Disp: , Rfl: 3 .  warfarin (COUMADIN) 6 MG tablet, TAKE 1 TO 1.5 TABLETS BY MOUTH DAILY AS DIRECTED BY COUMADIN CLINIC, Disp: 50 tablet, Rfl: 2

## 2017-07-10 DIAGNOSIS — N186 End stage renal disease: Secondary | ICD-10-CM | POA: Diagnosis not present

## 2017-07-10 DIAGNOSIS — Z992 Dependence on renal dialysis: Secondary | ICD-10-CM | POA: Diagnosis not present

## 2017-07-10 DIAGNOSIS — E1129 Type 2 diabetes mellitus with other diabetic kidney complication: Secondary | ICD-10-CM | POA: Diagnosis not present

## 2017-07-11 DIAGNOSIS — D631 Anemia in chronic kidney disease: Secondary | ICD-10-CM | POA: Diagnosis not present

## 2017-07-11 DIAGNOSIS — N186 End stage renal disease: Secondary | ICD-10-CM | POA: Diagnosis not present

## 2017-07-11 DIAGNOSIS — E039 Hypothyroidism, unspecified: Secondary | ICD-10-CM | POA: Diagnosis not present

## 2017-07-11 DIAGNOSIS — E8770 Fluid overload, unspecified: Secondary | ICD-10-CM | POA: Diagnosis not present

## 2017-07-11 DIAGNOSIS — Z4931 Encounter for adequacy testing for hemodialysis: Secondary | ICD-10-CM | POA: Diagnosis not present

## 2017-07-11 DIAGNOSIS — D509 Iron deficiency anemia, unspecified: Secondary | ICD-10-CM | POA: Diagnosis not present

## 2017-07-11 DIAGNOSIS — Z79899 Other long term (current) drug therapy: Secondary | ICD-10-CM | POA: Diagnosis not present

## 2017-07-11 DIAGNOSIS — K7689 Other specified diseases of liver: Secondary | ICD-10-CM | POA: Diagnosis not present

## 2017-07-11 DIAGNOSIS — E440 Moderate protein-calorie malnutrition: Secondary | ICD-10-CM | POA: Diagnosis not present

## 2017-07-20 ENCOUNTER — Ambulatory Visit (INDEPENDENT_AMBULATORY_CARE_PROVIDER_SITE_OTHER): Payer: Medicare HMO | Admitting: *Deleted

## 2017-07-20 DIAGNOSIS — I428 Other cardiomyopathies: Secondary | ICD-10-CM | POA: Diagnosis not present

## 2017-07-20 NOTE — Progress Notes (Signed)
Remote ICD transmission.   

## 2017-07-22 ENCOUNTER — Encounter: Payer: Self-pay | Admitting: Cardiology

## 2017-07-23 LAB — CUP PACEART REMOTE DEVICE CHECK
Battery Remaining Longevity: 97 mo
Battery Voltage: 3 V
Brady Statistic AP VS Percent: 0.1 %
Brady Statistic AS VP Percent: 0.16 %
Brady Statistic RA Percent Paced: 0.11 %
Date Time Interrogation Session: 20190311073424
HIGH POWER IMPEDANCE MEASURED VALUE: 41 Ohm
HIGH POWER IMPEDANCE MEASURED VALUE: 45 Ohm
Implantable Lead Implant Date: 20090109
Implantable Lead Location: 753860
Implantable Lead Model: 6947
Lead Channel Impedance Value: 304 Ohm
Lead Channel Pacing Threshold Amplitude: 1.25 V
Lead Channel Pacing Threshold Amplitude: 2.5 V
Lead Channel Pacing Threshold Pulse Width: 0.4 ms
Lead Channel Setting Pacing Amplitude: 5 V
Lead Channel Setting Sensing Sensitivity: 0.3 mV
MDC IDC LEAD IMPLANT DT: 20090109
MDC IDC LEAD LOCATION: 753859
MDC IDC MSMT LEADCHNL RA IMPEDANCE VALUE: 532 Ohm
MDC IDC MSMT LEADCHNL RA SENSING INTR AMPL: 1.125 mV
MDC IDC MSMT LEADCHNL RA SENSING INTR AMPL: 1.125 mV
MDC IDC MSMT LEADCHNL RV IMPEDANCE VALUE: 1159 Ohm
MDC IDC MSMT LEADCHNL RV PACING THRESHOLD PULSEWIDTH: 0.4 ms
MDC IDC MSMT LEADCHNL RV SENSING INTR AMPL: 4.75 mV
MDC IDC MSMT LEADCHNL RV SENSING INTR AMPL: 4.75 mV
MDC IDC PG IMPLANT DT: 20160205
MDC IDC SET LEADCHNL RA PACING AMPLITUDE: 2 V
MDC IDC SET LEADCHNL RV PACING PULSEWIDTH: 1 ms
MDC IDC STAT BRADY AP VP PERCENT: 0.01 %
MDC IDC STAT BRADY AS VS PERCENT: 99.73 %
MDC IDC STAT BRADY RV PERCENT PACED: 0.2 %

## 2017-07-25 ENCOUNTER — Encounter: Payer: Self-pay | Admitting: Cardiology

## 2017-07-29 ENCOUNTER — Ambulatory Visit (INDEPENDENT_AMBULATORY_CARE_PROVIDER_SITE_OTHER): Payer: Medicare HMO | Admitting: Pharmacist

## 2017-07-29 ENCOUNTER — Ambulatory Visit (INDEPENDENT_AMBULATORY_CARE_PROVIDER_SITE_OTHER): Payer: Medicare HMO | Admitting: Pulmonary Disease

## 2017-07-29 ENCOUNTER — Encounter: Payer: Self-pay | Admitting: Pulmonary Disease

## 2017-07-29 ENCOUNTER — Telehealth: Payer: Self-pay

## 2017-07-29 VITALS — BP 132/74 | HR 83 | Ht 73.0 in | Wt 280.0 lb

## 2017-07-29 DIAGNOSIS — N186 End stage renal disease: Secondary | ICD-10-CM

## 2017-07-29 DIAGNOSIS — I480 Paroxysmal atrial fibrillation: Secondary | ICD-10-CM | POA: Diagnosis not present

## 2017-07-29 DIAGNOSIS — Z7901 Long term (current) use of anticoagulants: Secondary | ICD-10-CM | POA: Diagnosis not present

## 2017-07-29 DIAGNOSIS — I953 Hypotension of hemodialysis: Secondary | ICD-10-CM

## 2017-07-29 DIAGNOSIS — I272 Pulmonary hypertension, unspecified: Secondary | ICD-10-CM

## 2017-07-29 DIAGNOSIS — D631 Anemia in chronic kidney disease: Secondary | ICD-10-CM

## 2017-07-29 DIAGNOSIS — N189 Chronic kidney disease, unspecified: Secondary | ICD-10-CM | POA: Diagnosis not present

## 2017-07-29 LAB — POCT INR: INR: 2.1

## 2017-07-29 NOTE — Telephone Encounter (Signed)
PA request for Tadalafil 20mg  has been started via CMM.  KEY: [id number]  Will route to [place] to f/u on

## 2017-07-29 NOTE — Progress Notes (Signed)
Subjective:    Patient ID: Adam Alm., male    DOB: [birth year], 57 y.o.   MRN: [id number]  Synopsis: Diagnosed with pulmonary hypertension in 2012. 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 Unfortunately his kidney transplant failed for uncertain reasons by 8119. Was diagnosed with pulmonary hypertension by 2012 in [place]. Was started on [place] in [place] and moved to [place] not long afterwards. Developed atrial fibrillation around 2009, had a defibrillator placed.  08/2015 RHC [facility]: Cardiac Index (l/min/m2) 3.7 L/min/m2  Right Atrium Mean Pressure (mmHg) 5 mmHg   Right Ventricle Systolic Pressure (mmHg) 46 mmHg   Pulmonary Artery Mean Pressure (mmHg) 31 mmHg   Pulmonary Wedge Pressure (mmHg) 11 mmHg   Pulmonary Vascular Resistance (Wood units) 2.2 Wood units   He had a second evaluation for renal transplant at [facility] in 2017 but was denied because of his hypertension and atrial fibrillation.  His dry weight is 126kg, his BP is best there.    He was hospitalized in early 2018 for healthcare associated pneumonia  HPI Chief Complaint  Patient presents with  . Follow-up    rov per Dr. Loanne Drilling.  Pt has good and bad days, has increased fatigue.  Notes his hgb has been low lately    Adam [name] has been doing well since the last visit.  He continues to participate in daily hemodialysis sessions at home and he says his blood pressure is much more stable on this regimen.  He feels well.  No problems with shortness of breath.  However, he does have some fatigue from time to time which she attributes to his low hemoglobin and anemia.  He said he was recently given an injection for this.  He denies any change in the color or character of his stool no recent blood in his stool.  No recent leg swelling.  No recent cough or congestion.  He has not had pneumonia since last year.   Past Medical History:  Diagnosis Date  . AICD  (automatic cardioverter/defibrillator) present    Medtronic   . Aortic stenosis    moderate AS by 08/2016 echo  . Arthritis    "knees" (08/05/2016)  . Atrial fibrillation ([place])   . ESRD (end stage renal disease) on dialysis ([place])    "M, T, W, T, F; NX stage hemodialysis; I do it at home" (08/05/2016)  . Great toe amputation status ([place])    status post left hallux amputation 08/01/14  . History of blood transfusion 2009   "S/P biopsy for prostate cancer check"  . Hypothyroidism (acquired)   . Mitral stenosis    moderate mitral stenosis  . Nonischemic cardiomyopathy ([place])   . Pneumonia 2014; 08/05/2016  . PONV (postoperative nausea and vomiting)   . Pulmonary HTN ([place])   . Thyroid cancer ([place]) 2011      Review of Systems  Constitutional: Negative for chills, fatigue and fever.  HENT: Negative for postnasal drip, rhinorrhea and sinus pressure.   Respiratory: Negative for cough, shortness of breath and wheezing.   Cardiovascular: Negative for chest pain, palpitations and leg swelling.       Objective:   Physical Exam Vitals:   07/29/17 1200  BP: 132/74  Pulse: 83  SpO2: 96%  Weight: 280 lb (127 kg)  Height: 6\' 1"  (1.854 m)   RA  Gen: well appearing HENT: OP clear, TM's clear, neck supple PULM: CTA B, normal percussion CV: RRR,  systolic murmur, trace edema GI: BS+, soft, nontender Derm: no cyanosis or rash Psyche: normal mood and affect   6MW March 2017 6 minute walk test at [place] m, O2 saturation 100% on room air  Echo: March 2017 echocardiogram [facility] RVSP 40 mmHg, LVEF 55% March 2018 echo [facility]: RVSP 29mm Hg  RHC April 2017 right heart catheterization [facility] cardiac index 3.7, right atrial pressure 5, RVSP 46, mean PA pressure 31, pulmonary capillary wedge pressure 11, pulmonary vascular resistance 2.2  Records from his visit with vascular surgery last month reviewed, he recently had an AV fistula placed    Assessment & Plan:     Pulmonary hypertension ([place])  Anemia due to chronic kidney disease, unspecified CKD stage  Hemodialysis-associated hypotension  ESRD (end stage renal disease) ([place])  Discussion: From a pulmonary hypertension standpoint [name] has been doing well and seems to do quite well with daily dialysis sessions.  He has been struggling with anemia.  It is not clear to me that he has been assessed for bleeding or iron deficiency so would like to see the lab work that he said recently.  I also want to check his liver function testing but I think this may have been done at dialysis recently so were going to request those results.  Plan:   Pulmonary hypertension: Continue Letairis and Tadalafil We will check the liver function tests from your dialysis center We will follow-up the echocardiogram which has been arranged for April 2019 Continue daily dialysis sessions and weight monitoring Let me know if you have any change in shortness of breath 6-minute walk next visit  End-stage renal disease: Continue daily dialysis sessions  Hypotension: Continue Midrin as directed by the dialysis clinic  Anemia: We will follow-up blood work which has been collected from the dialysis center If they have not collected it we will get iron studies  Follow up 3-4 months or sooner if needed    Current Outpatient Medications:  .  ambrisentan (LETAIRIS) 10 MG tablet, Take 1 tablet (10 mg total) by mouth daily., Disp: 30 tablet, Rfl: 11 .  amiodarone (PACERONE) 200 MG tablet, Take 200 mg by mouth every evening. , Disp: , Rfl:  .  atorvastatin (LIPITOR) 40 MG tablet, Take 1 tablet (40 mg total) by mouth every evening., Disp: 30 tablet, Rfl: 2 .  cinacalcet (SENSIPAR) 90 MG tablet, Take 90 mg by mouth every evening. , Disp: , Rfl:  .  levothyroxine (SYNTHROID, LEVOTHROID) 300 MCG tablet, Take 1 tablet (300 mcg total) by mouth daily., Disp: 90 tablet, Rfl: 3 .  midodrine (PROAMATINE) 10 MG tablet, Take 1 tablet  (10 mg total) by mouth 3 (three) times daily., Disp: 90 tablet, Rfl: 6 .  tadalafil, PAH, (ADCIRCA) 20 MG tablet, 1 tab daily, Disp: 30 tablet, Rfl: 11 .  warfarin (COUMADIN) 6 MG tablet, Take 6-9 mg by mouth See admin instructions. Take 1.5 tablets (9 mg) by mouth in the evening, except take 1 tablet (6 mg) in the evening on Sunday., Disp: , Rfl: 3

## 2017-07-29 NOTE — Patient Instructions (Signed)
Pulmonary hypertension: Continue Letairis and Tadalafil We will check the liver function tests from your dialysis center We will follow-up the echocardiogram which has been arranged for April 2019 Continue daily dialysis sessions and weight monitoring Let me know if you have any change in shortness of breath 6-minute walk next visit  End-stage renal disease: Continue daily dialysis sessions  Hypotension: Continue Midrin as directed by the dialysis clinic  Anemia: We will follow-up blood work which has been collected from the dialysis center If they have not collected it we will get iron studies  Follow up 3-4 months or sooner if needed

## 2017-07-30 NOTE — Telephone Encounter (Signed)
Followed up on 07/30/17  Denied on March 20   Request Reference Number: [id number]. TADALAFIL TAB 20MG  is denied for not meeting the prior authorization requirement(s). For further questions, call [phone]. Appeals are not supported through [place]. Please refer to the fax case notice for appeals information and instructions.  DrugTadalafil 20MG  OR TABS  FormOptumRx Electronic Prior Authorization Form  [facility] 07/30/17  Will route message to BQ

## 2017-07-30 NOTE — Telephone Encounter (Signed)
Pt is calling back [phone]

## 2017-07-30 NOTE — Telephone Encounter (Addendum)
Called and spoke with pt.  Pt is aware of denial.  Denial letter states that Adcirica 20mg  was denied due to exceeding the quantity limit of 4 tablets per 30 days  BQ please advise if you would like to do appeal letter & if so what would you like letter to state. Thanks.

## 2017-08-03 NOTE — Telephone Encounter (Signed)
It sounds like this needs to be coded differently.  Adam [name] is a well know pulmonary hypertension medicine but taladafil is also an erectile dysfunction medicine that probably has such limitations for use.  Please let the pharmacy knows that.

## 2017-08-05 DIAGNOSIS — M1711 Unilateral primary osteoarthritis, right knee: Secondary | ICD-10-CM | POA: Diagnosis not present

## 2017-08-05 DIAGNOSIS — M170 Bilateral primary osteoarthritis of knee: Secondary | ICD-10-CM | POA: Diagnosis not present

## 2017-08-05 DIAGNOSIS — M1712 Unilateral primary osteoarthritis, left knee: Secondary | ICD-10-CM | POA: Diagnosis not present

## 2017-08-06 NOTE — Telephone Encounter (Signed)
Chemeka from [facility] calling about this PA. Per Che[place]ka, there is no appeal filed for this pt. Shipment of Tadalafil is due to go out on 4/1. Cb is [phone]. [phone] Pt's insurance Plan [phone].

## 2017-08-06 NOTE — Telephone Encounter (Signed)
Adam [name] from [place] back [phone] for authorization for the tadalafil

## 2017-08-06 NOTE — Telephone Encounter (Signed)
Called and spoke with Melissa from [facility] [phone]) regarding shipment of medication. Melissa stated that the PA was denied and the appeal process had not been started for this patient yet. She stated that the PA for Adcirca has been denied twice and would need to have a formal appeal.  Called and spoke with Thayer Headings in appeals at [phone]. Started the appeal process and faxed over all supporting documents to appeals office at [phone]. Thayer Headings stated it could be 72 hours before a decision is made.   Will route to [place] to follow up on.

## 2017-08-10 DIAGNOSIS — E440 Moderate protein-calorie malnutrition: Secondary | ICD-10-CM | POA: Diagnosis not present

## 2017-08-10 DIAGNOSIS — E7849 Other hyperlipidemia: Secondary | ICD-10-CM | POA: Diagnosis not present

## 2017-08-10 DIAGNOSIS — Z4931 Encounter for adequacy testing for hemodialysis: Secondary | ICD-10-CM | POA: Diagnosis not present

## 2017-08-10 DIAGNOSIS — N186 End stage renal disease: Secondary | ICD-10-CM | POA: Diagnosis not present

## 2017-08-10 DIAGNOSIS — E039 Hypothyroidism, unspecified: Secondary | ICD-10-CM | POA: Diagnosis not present

## 2017-08-10 DIAGNOSIS — D509 Iron deficiency anemia, unspecified: Secondary | ICD-10-CM | POA: Diagnosis not present

## 2017-08-10 DIAGNOSIS — E8770 Fluid overload, unspecified: Secondary | ICD-10-CM | POA: Diagnosis not present

## 2017-08-10 DIAGNOSIS — N2589 Other disorders resulting from impaired renal tubular function: Secondary | ICD-10-CM | POA: Diagnosis not present

## 2017-08-10 DIAGNOSIS — N2581 Secondary hyperparathyroidism of renal origin: Secondary | ICD-10-CM | POA: Diagnosis not present

## 2017-08-10 NOTE — Telephone Encounter (Signed)
A fax from [employer] came in and they approved Tadalafil 20mg  until 08/07/2018.

## 2017-08-11 DIAGNOSIS — N186 End stage renal disease: Secondary | ICD-10-CM | POA: Diagnosis not present

## 2017-08-11 DIAGNOSIS — E7849 Other hyperlipidemia: Secondary | ICD-10-CM | POA: Diagnosis not present

## 2017-08-11 DIAGNOSIS — Z4931 Encounter for adequacy testing for hemodialysis: Secondary | ICD-10-CM | POA: Diagnosis not present

## 2017-08-11 DIAGNOSIS — N2589 Other disorders resulting from impaired renal tubular function: Secondary | ICD-10-CM | POA: Diagnosis not present

## 2017-08-11 DIAGNOSIS — E8770 Fluid overload, unspecified: Secondary | ICD-10-CM | POA: Diagnosis not present

## 2017-08-11 DIAGNOSIS — E039 Hypothyroidism, unspecified: Secondary | ICD-10-CM | POA: Diagnosis not present

## 2017-08-11 DIAGNOSIS — E440 Moderate protein-calorie malnutrition: Secondary | ICD-10-CM | POA: Diagnosis not present

## 2017-08-11 DIAGNOSIS — D509 Iron deficiency anemia, unspecified: Secondary | ICD-10-CM | POA: Diagnosis not present

## 2017-08-11 DIAGNOSIS — N2581 Secondary hyperparathyroidism of renal origin: Secondary | ICD-10-CM | POA: Diagnosis not present

## 2017-08-11 NOTE — Telephone Encounter (Signed)
Spoke with pt, aware of approval.  States that he has already spoken to his pharmacy to arrange shipment.  Nothing further needed.

## 2017-08-12 DIAGNOSIS — N186 End stage renal disease: Secondary | ICD-10-CM | POA: Diagnosis not present

## 2017-08-12 DIAGNOSIS — N2589 Other disorders resulting from impaired renal tubular function: Secondary | ICD-10-CM | POA: Diagnosis not present

## 2017-08-12 DIAGNOSIS — E8770 Fluid overload, unspecified: Secondary | ICD-10-CM | POA: Diagnosis not present

## 2017-08-12 DIAGNOSIS — N2581 Secondary hyperparathyroidism of renal origin: Secondary | ICD-10-CM | POA: Diagnosis not present

## 2017-08-12 DIAGNOSIS — E039 Hypothyroidism, unspecified: Secondary | ICD-10-CM | POA: Diagnosis not present

## 2017-08-12 DIAGNOSIS — D509 Iron deficiency anemia, unspecified: Secondary | ICD-10-CM | POA: Diagnosis not present

## 2017-08-12 DIAGNOSIS — Z4931 Encounter for adequacy testing for hemodialysis: Secondary | ICD-10-CM | POA: Diagnosis not present

## 2017-08-12 DIAGNOSIS — E440 Moderate protein-calorie malnutrition: Secondary | ICD-10-CM | POA: Diagnosis not present

## 2017-08-12 DIAGNOSIS — E7849 Other hyperlipidemia: Secondary | ICD-10-CM | POA: Diagnosis not present

## 2017-08-13 DIAGNOSIS — E8770 Fluid overload, unspecified: Secondary | ICD-10-CM | POA: Diagnosis not present

## 2017-08-13 DIAGNOSIS — E039 Hypothyroidism, unspecified: Secondary | ICD-10-CM | POA: Diagnosis not present

## 2017-08-13 DIAGNOSIS — E440 Moderate protein-calorie malnutrition: Secondary | ICD-10-CM | POA: Diagnosis not present

## 2017-08-13 DIAGNOSIS — E7849 Other hyperlipidemia: Secondary | ICD-10-CM | POA: Diagnosis not present

## 2017-08-13 DIAGNOSIS — Z4931 Encounter for adequacy testing for hemodialysis: Secondary | ICD-10-CM | POA: Diagnosis not present

## 2017-08-13 DIAGNOSIS — D509 Iron deficiency anemia, unspecified: Secondary | ICD-10-CM | POA: Diagnosis not present

## 2017-08-13 DIAGNOSIS — N186 End stage renal disease: Secondary | ICD-10-CM | POA: Diagnosis not present

## 2017-08-13 DIAGNOSIS — N2589 Other disorders resulting from impaired renal tubular function: Secondary | ICD-10-CM | POA: Diagnosis not present

## 2017-08-13 DIAGNOSIS — N2581 Secondary hyperparathyroidism of renal origin: Secondary | ICD-10-CM | POA: Diagnosis not present

## 2017-08-14 DIAGNOSIS — E8770 Fluid overload, unspecified: Secondary | ICD-10-CM | POA: Diagnosis not present

## 2017-08-14 DIAGNOSIS — E039 Hypothyroidism, unspecified: Secondary | ICD-10-CM | POA: Diagnosis not present

## 2017-08-14 DIAGNOSIS — E440 Moderate protein-calorie malnutrition: Secondary | ICD-10-CM | POA: Diagnosis not present

## 2017-08-14 DIAGNOSIS — Z4931 Encounter for adequacy testing for hemodialysis: Secondary | ICD-10-CM | POA: Diagnosis not present

## 2017-08-14 DIAGNOSIS — N2589 Other disorders resulting from impaired renal tubular function: Secondary | ICD-10-CM | POA: Diagnosis not present

## 2017-08-14 DIAGNOSIS — D509 Iron deficiency anemia, unspecified: Secondary | ICD-10-CM | POA: Diagnosis not present

## 2017-08-14 DIAGNOSIS — N2581 Secondary hyperparathyroidism of renal origin: Secondary | ICD-10-CM | POA: Diagnosis not present

## 2017-08-14 DIAGNOSIS — N186 End stage renal disease: Secondary | ICD-10-CM | POA: Diagnosis not present

## 2017-08-14 DIAGNOSIS — E7849 Other hyperlipidemia: Secondary | ICD-10-CM | POA: Diagnosis not present

## 2017-08-17 ENCOUNTER — Inpatient Hospital Stay (HOSPITAL_COMMUNITY)
Admission: EM | Admit: 2017-08-17 | Discharge: 2017-08-28 | DRG: 252 | Disposition: A | Discharge status: Home / Self Care | Payer: Medicare HMO | Attending: Internal Medicine | Admitting: Internal Medicine

## 2017-08-17 ENCOUNTER — Encounter (HOSPITAL_COMMUNITY): Payer: Self-pay

## 2017-08-17 ENCOUNTER — Emergency Department (HOSPITAL_COMMUNITY): Payer: Medicare HMO

## 2017-08-17 ENCOUNTER — Other Ambulatory Visit: Payer: Self-pay

## 2017-08-17 DIAGNOSIS — Z89421 Acquired absence of other right toe(s): Secondary | ICD-10-CM

## 2017-08-17 DIAGNOSIS — I4891 Unspecified atrial fibrillation: Secondary | ICD-10-CM | POA: Diagnosis present

## 2017-08-17 DIAGNOSIS — J9601 Acute respiratory failure with hypoxia: Secondary | ICD-10-CM | POA: Diagnosis not present

## 2017-08-17 DIAGNOSIS — Z940 Kidney transplant status: Secondary | ICD-10-CM | POA: Diagnosis not present

## 2017-08-17 DIAGNOSIS — E890 Postprocedural hypothyroidism: Secondary | ICD-10-CM | POA: Diagnosis not present

## 2017-08-17 DIAGNOSIS — Z9911 Dependence on respirator [ventilator] status: Secondary | ICD-10-CM | POA: Diagnosis not present

## 2017-08-17 DIAGNOSIS — I809 Phlebitis and thrombophlebitis of unspecified site: Secondary | ICD-10-CM | POA: Diagnosis present

## 2017-08-17 DIAGNOSIS — I739 Peripheral vascular disease, unspecified: Secondary | ICD-10-CM | POA: Diagnosis present

## 2017-08-17 DIAGNOSIS — I120 Hypertensive chronic kidney disease with stage 5 chronic kidney disease or end stage renal disease: Secondary | ICD-10-CM | POA: Diagnosis not present

## 2017-08-17 DIAGNOSIS — L02414 Cutaneous abscess of left upper limb: Secondary | ICD-10-CM | POA: Diagnosis not present

## 2017-08-17 DIAGNOSIS — T8241XA Breakdown (mechanical) of vascular dialysis catheter, initial encounter: Secondary | ICD-10-CM | POA: Diagnosis present

## 2017-08-17 DIAGNOSIS — E8889 Other specified metabolic disorders: Secondary | ICD-10-CM | POA: Diagnosis present

## 2017-08-17 DIAGNOSIS — Z01818 Encounter for other preprocedural examination: Secondary | ICD-10-CM

## 2017-08-17 DIAGNOSIS — Z992 Dependence on renal dialysis: Secondary | ICD-10-CM

## 2017-08-17 DIAGNOSIS — E440 Moderate protein-calorie malnutrition: Secondary | ICD-10-CM | POA: Diagnosis not present

## 2017-08-17 DIAGNOSIS — N2581 Secondary hyperparathyroidism of renal origin: Secondary | ICD-10-CM | POA: Diagnosis not present

## 2017-08-17 DIAGNOSIS — R609 Edema, unspecified: Secondary | ICD-10-CM | POA: Diagnosis not present

## 2017-08-17 DIAGNOSIS — I361 Nonrheumatic tricuspid (valve) insufficiency: Secondary | ICD-10-CM | POA: Diagnosis not present

## 2017-08-17 DIAGNOSIS — T827XXA Infection and inflammatory reaction due to other cardiac and vascular devices, implants and grafts, initial encounter: Secondary | ICD-10-CM | POA: Diagnosis not present

## 2017-08-17 DIAGNOSIS — N186 End stage renal disease: Secondary | ICD-10-CM | POA: Diagnosis not present

## 2017-08-17 DIAGNOSIS — Z6836 Body mass index (BMI) 36.0-36.9, adult: Secondary | ICD-10-CM

## 2017-08-17 DIAGNOSIS — I952 Hypotension due to drugs: Secondary | ICD-10-CM | POA: Diagnosis not present

## 2017-08-17 DIAGNOSIS — I959 Hypotension, unspecified: Secondary | ICD-10-CM

## 2017-08-17 DIAGNOSIS — Y831 Surgical operation with implant of artificial internal device as the cause of abnormal reaction of the patient, or of later complication, without mention of misadventure at the time of the procedure: Secondary | ICD-10-CM | POA: Diagnosis present

## 2017-08-17 DIAGNOSIS — T8189XA Other complications of procedures, not elsewhere classified, initial encounter: Secondary | ICD-10-CM | POA: Diagnosis not present

## 2017-08-17 DIAGNOSIS — R791 Abnormal coagulation profile: Secondary | ICD-10-CM | POA: Diagnosis not present

## 2017-08-17 DIAGNOSIS — I350 Nonrheumatic aortic (valve) stenosis: Secondary | ICD-10-CM | POA: Diagnosis not present

## 2017-08-17 DIAGNOSIS — Z9884 Bariatric surgery status: Secondary | ICD-10-CM

## 2017-08-17 DIAGNOSIS — G9340 Encephalopathy, unspecified: Secondary | ICD-10-CM

## 2017-08-17 DIAGNOSIS — I481 Persistent atrial fibrillation: Secondary | ICD-10-CM | POA: Diagnosis not present

## 2017-08-17 DIAGNOSIS — Z9889 Other specified postprocedural states: Secondary | ICD-10-CM

## 2017-08-17 DIAGNOSIS — I517 Cardiomegaly: Secondary | ICD-10-CM | POA: Diagnosis not present

## 2017-08-17 DIAGNOSIS — D509 Iron deficiency anemia, unspecified: Secondary | ICD-10-CM | POA: Diagnosis not present

## 2017-08-17 DIAGNOSIS — T8130XA Disruption of wound, unspecified, initial encounter: Secondary | ICD-10-CM | POA: Diagnosis not present

## 2017-08-17 DIAGNOSIS — I272 Pulmonary hypertension, unspecified: Secondary | ICD-10-CM | POA: Diagnosis not present

## 2017-08-17 DIAGNOSIS — E785 Hyperlipidemia, unspecified: Secondary | ICD-10-CM | POA: Diagnosis present

## 2017-08-17 DIAGNOSIS — T4145XA Adverse effect of unspecified anesthetic, initial encounter: Secondary | ICD-10-CM | POA: Diagnosis not present

## 2017-08-17 DIAGNOSIS — Z4659 Encounter for fitting and adjustment of other gastrointestinal appliance and device: Secondary | ICD-10-CM

## 2017-08-17 DIAGNOSIS — Z888 Allergy status to other drugs, medicaments and biological substances status: Secondary | ICD-10-CM

## 2017-08-17 DIAGNOSIS — I4581 Long QT syndrome: Secondary | ICD-10-CM | POA: Diagnosis present

## 2017-08-17 DIAGNOSIS — R011 Cardiac murmur, unspecified: Secondary | ICD-10-CM | POA: Diagnosis not present

## 2017-08-17 DIAGNOSIS — J9811 Atelectasis: Secondary | ICD-10-CM | POA: Diagnosis not present

## 2017-08-17 DIAGNOSIS — D696 Thrombocytopenia, unspecified: Secondary | ICD-10-CM | POA: Diagnosis present

## 2017-08-17 DIAGNOSIS — Z95810 Presence of automatic (implantable) cardiac defibrillator: Secondary | ICD-10-CM

## 2017-08-17 DIAGNOSIS — A4101 Sepsis due to Methicillin susceptible Staphylococcus aureus: Secondary | ICD-10-CM | POA: Diagnosis present

## 2017-08-17 DIAGNOSIS — E669 Obesity, unspecified: Secondary | ICD-10-CM | POA: Diagnosis present

## 2017-08-17 DIAGNOSIS — I480 Paroxysmal atrial fibrillation: Secondary | ICD-10-CM | POA: Diagnosis present

## 2017-08-17 DIAGNOSIS — Z978 Presence of other specified devices: Secondary | ICD-10-CM

## 2017-08-17 DIAGNOSIS — Z9289 Personal history of other medical treatment: Secondary | ICD-10-CM

## 2017-08-17 DIAGNOSIS — I2721 Secondary pulmonary arterial hypertension: Secondary | ICD-10-CM | POA: Diagnosis not present

## 2017-08-17 DIAGNOSIS — E7849 Other hyperlipidemia: Secondary | ICD-10-CM | POA: Diagnosis not present

## 2017-08-17 DIAGNOSIS — I428 Other cardiomyopathies: Secondary | ICD-10-CM | POA: Diagnosis present

## 2017-08-17 DIAGNOSIS — E039 Hypothyroidism, unspecified: Secondary | ICD-10-CM | POA: Diagnosis not present

## 2017-08-17 DIAGNOSIS — M7989 Other specified soft tissue disorders: Secondary | ICD-10-CM | POA: Diagnosis not present

## 2017-08-17 DIAGNOSIS — T8612 Kidney transplant failure: Secondary | ICD-10-CM | POA: Diagnosis not present

## 2017-08-17 DIAGNOSIS — I9589 Other hypotension: Secondary | ICD-10-CM | POA: Diagnosis present

## 2017-08-17 DIAGNOSIS — M19012 Primary osteoarthritis, left shoulder: Secondary | ICD-10-CM | POA: Diagnosis not present

## 2017-08-17 DIAGNOSIS — Z89422 Acquired absence of other left toe(s): Secondary | ICD-10-CM

## 2017-08-17 DIAGNOSIS — T829XXD Unspecified complication of cardiac and vascular prosthetic device, implant and graft, subsequent encounter: Secondary | ICD-10-CM | POA: Diagnosis not present

## 2017-08-17 DIAGNOSIS — I132 Hypertensive heart and chronic kidney disease with heart failure and with stage 5 chronic kidney disease, or end stage renal disease: Secondary | ICD-10-CM | POA: Diagnosis not present

## 2017-08-17 DIAGNOSIS — L814 Other melanin hyperpigmentation: Secondary | ICD-10-CM | POA: Diagnosis not present

## 2017-08-17 DIAGNOSIS — N185 Chronic kidney disease, stage 5: Secondary | ICD-10-CM | POA: Diagnosis not present

## 2017-08-17 DIAGNOSIS — Z85850 Personal history of malignant neoplasm of thyroid: Secondary | ICD-10-CM

## 2017-08-17 DIAGNOSIS — D62 Acute posthemorrhagic anemia: Secondary | ICD-10-CM | POA: Diagnosis not present

## 2017-08-17 DIAGNOSIS — I509 Heart failure, unspecified: Secondary | ICD-10-CM | POA: Diagnosis present

## 2017-08-17 DIAGNOSIS — J90 Pleural effusion, not elsewhere classified: Secondary | ICD-10-CM | POA: Diagnosis not present

## 2017-08-17 DIAGNOSIS — K209 Esophagitis, unspecified: Secondary | ICD-10-CM | POA: Diagnosis not present

## 2017-08-17 DIAGNOSIS — Z79899 Other long term (current) drug therapy: Secondary | ICD-10-CM

## 2017-08-17 DIAGNOSIS — K2210 Ulcer of esophagus without bleeding: Secondary | ICD-10-CM | POA: Diagnosis not present

## 2017-08-17 DIAGNOSIS — R509 Fever, unspecified: Secondary | ICD-10-CM

## 2017-08-17 DIAGNOSIS — T827XXD Infection and inflammatory reaction due to other cardiac and vascular devices, implants and grafts, subsequent encounter: Secondary | ICD-10-CM | POA: Diagnosis not present

## 2017-08-17 DIAGNOSIS — Z4682 Encounter for fitting and adjustment of non-vascular catheter: Secondary | ICD-10-CM | POA: Diagnosis not present

## 2017-08-17 DIAGNOSIS — E8770 Fluid overload, unspecified: Secondary | ICD-10-CM | POA: Diagnosis not present

## 2017-08-17 DIAGNOSIS — I2510 Atherosclerotic heart disease of native coronary artery without angina pectoris: Secondary | ICD-10-CM | POA: Diagnosis not present

## 2017-08-17 DIAGNOSIS — I472 Ventricular tachycardia: Secondary | ICD-10-CM | POA: Diagnosis not present

## 2017-08-17 DIAGNOSIS — B9561 Methicillin susceptible Staphylococcus aureus infection as the cause of diseases classified elsewhere: Secondary | ICD-10-CM

## 2017-08-17 DIAGNOSIS — Y832 Surgical operation with anastomosis, bypass or graft as the cause of abnormal reaction of the patient, or of later complication, without mention of misadventure at the time of the procedure: Secondary | ICD-10-CM | POA: Diagnosis present

## 2017-08-17 DIAGNOSIS — Z91041 Radiographic dye allergy status: Secondary | ICD-10-CM

## 2017-08-17 DIAGNOSIS — J189 Pneumonia, unspecified organism: Secondary | ICD-10-CM | POA: Diagnosis present

## 2017-08-17 DIAGNOSIS — T82898A Other specified complication of vascular prosthetic devices, implants and grafts, initial encounter: Secondary | ICD-10-CM | POA: Diagnosis not present

## 2017-08-17 DIAGNOSIS — M79602 Pain in left arm: Secondary | ICD-10-CM | POA: Diagnosis not present

## 2017-08-17 DIAGNOSIS — R6521 Severe sepsis with septic shock: Secondary | ICD-10-CM | POA: Diagnosis not present

## 2017-08-17 DIAGNOSIS — A419 Sepsis, unspecified organism: Secondary | ICD-10-CM | POA: Diagnosis present

## 2017-08-17 DIAGNOSIS — Z95828 Presence of other vascular implants and grafts: Secondary | ICD-10-CM | POA: Diagnosis not present

## 2017-08-17 DIAGNOSIS — Z89412 Acquired absence of left great toe: Secondary | ICD-10-CM

## 2017-08-17 DIAGNOSIS — Z7901 Long term (current) use of anticoagulants: Secondary | ICD-10-CM

## 2017-08-17 DIAGNOSIS — I080 Rheumatic disorders of both mitral and aortic valves: Secondary | ICD-10-CM | POA: Diagnosis present

## 2017-08-17 DIAGNOSIS — D631 Anemia in chronic kidney disease: Secondary | ICD-10-CM | POA: Diagnosis not present

## 2017-08-17 DIAGNOSIS — T82590A Other mechanical complication of surgically created arteriovenous fistula, initial encounter: Secondary | ICD-10-CM | POA: Diagnosis not present

## 2017-08-17 DIAGNOSIS — R0989 Other specified symptoms and signs involving the circulatory and respiratory systems: Secondary | ICD-10-CM | POA: Diagnosis not present

## 2017-08-17 DIAGNOSIS — M170 Bilateral primary osteoarthritis of knee: Secondary | ICD-10-CM | POA: Diagnosis present

## 2017-08-17 DIAGNOSIS — N2589 Other disorders resulting from impaired renal tubular function: Secondary | ICD-10-CM | POA: Diagnosis not present

## 2017-08-17 DIAGNOSIS — Z9189 Other specified personal risk factors, not elsewhere classified: Secondary | ICD-10-CM

## 2017-08-17 DIAGNOSIS — T829XXA Unspecified complication of cardiac and vascular prosthetic device, implant and graft, initial encounter: Secondary | ICD-10-CM

## 2017-08-17 DIAGNOSIS — R7881 Bacteremia: Secondary | ICD-10-CM

## 2017-08-17 DIAGNOSIS — Z4931 Encounter for adequacy testing for hemodialysis: Secondary | ICD-10-CM | POA: Diagnosis not present

## 2017-08-17 DIAGNOSIS — A4102 Sepsis due to Methicillin resistant Staphylococcus aureus: Secondary | ICD-10-CM | POA: Diagnosis not present

## 2017-08-17 HISTORY — DX: Disorder of kidney and ureter, unspecified: N28.9

## 2017-08-17 LAB — CBC WITH DIFFERENTIAL/PLATELET
Basophils Absolute: 0 10*3/uL (ref 0.0–0.1)
Basophils Relative: 0 %
EOS PCT: 0 %
Eosinophils Absolute: 0 10*3/uL (ref 0.0–0.7)
HEMATOCRIT: 33 % — AB (ref 39.0–52.0)
Hemoglobin: 10.5 g/dL — ABNORMAL LOW (ref 13.0–17.0)
LYMPHS ABS: 0.5 10*3/uL — AB (ref 0.7–4.0)
LYMPHS PCT: 4 %
MCH: 28.8 pg (ref 26.0–34.0)
MCHC: 31.8 g/dL (ref 30.0–36.0)
MCV: 90.7 fL (ref 78.0–100.0)
MONO ABS: 0.4 10*3/uL (ref 0.1–1.0)
Monocytes Relative: 3 %
Neutro Abs: 11.1 10*3/uL — ABNORMAL HIGH (ref 1.7–7.7)
Neutrophils Relative %: 93 %
PLATELETS: 78 10*3/uL — AB (ref 150–400)
RBC: 3.64 MIL/uL — ABNORMAL LOW (ref 4.22–5.81)
RDW: 18.7 % — AB (ref 11.5–15.5)
WBC: 12 10*3/uL — ABNORMAL HIGH (ref 4.0–10.5)

## 2017-08-17 LAB — COMPREHENSIVE METABOLIC PANEL
ALBUMIN: 3.4 g/dL — AB (ref 3.5–5.0)
ALT: 13 U/L — ABNORMAL LOW (ref 17–63)
AST: 24 U/L (ref 15–41)
Alkaline Phosphatase: 90 U/L (ref 38–126)
Anion gap: 19 — ABNORMAL HIGH (ref 5–15)
BILIRUBIN TOTAL: 1.1 mg/dL (ref 0.3–1.2)
BUN: 55 mg/dL — AB (ref 6–20)
CHLORIDE: 93 mmol/L — AB (ref 101–111)
CO2: 21 mmol/L — ABNORMAL LOW (ref 22–32)
Calcium: 8.2 mg/dL — ABNORMAL LOW (ref 8.9–10.3)
Creatinine, Ser: 10.98 mg/dL — ABNORMAL HIGH (ref 0.61–1.24)
GFR calc Af Amer: 5 mL/min — ABNORMAL LOW (ref >60)
GFR calc non Af Amer: 5 mL/min — ABNORMAL LOW (ref >60)
GLUCOSE: 106 mg/dL — AB (ref 65–99)
POTASSIUM: 4.2 mmol/L (ref 3.5–5.1)
SODIUM: 133 mmol/L — AB (ref 135–145)
Total Protein: 6.8 g/dL (ref 6.5–8.1)

## 2017-08-17 LAB — I-STAT CG4 LACTIC ACID, ED
LACTIC ACID, VENOUS: 1.6 mmol/L (ref 0.5–1.9)
Lactic Acid, Venous: 1.94 mmol/L — ABNORMAL HIGH (ref 0.5–1.9)

## 2017-08-17 LAB — PROTIME-INR
INR: 3.88
Prothrombin Time: 37.8 seconds — ABNORMAL HIGH (ref 11.4–15.2)

## 2017-08-17 LAB — INFLUENZA PANEL BY PCR (TYPE A & B)
Influenza A By PCR: NEGATIVE
Influenza B By PCR: NEGATIVE

## 2017-08-17 LAB — MRSA PCR SCREENING: MRSA by PCR: NEGATIVE

## 2017-08-17 MED ORDER — AMBRISENTAN 5 MG PO TABS
10.0000 mg | ORAL_TABLET | Freq: Every day | ORAL | Status: DC
  Filled 2017-08-17: qty 2

## 2017-08-17 MED ORDER — WARFARIN - PHARMACIST DOSING INPATIENT
Freq: Every day | Status: DC
  Administered 2017-08-21 – 2017-08-26 (×4)

## 2017-08-17 MED ORDER — SODIUM CHLORIDE 0.9 % IV SOLN
1.0000 g | INTRAVENOUS | Status: DC
  Filled 2017-08-17: qty 10

## 2017-08-17 MED ORDER — AMIODARONE HCL 200 MG PO TABS
200.0000 mg | ORAL_TABLET | Freq: Every evening | ORAL | Status: DC
  Administered 2017-08-17 – 2017-08-27 (×11): 200 mg via ORAL
  Filled 2017-08-17 (×11): qty 1

## 2017-08-17 MED ORDER — OXYCODONE HCL 5 MG PO TABS: 5.0000 mg | ORAL_TABLET | ORAL | Status: DC | PRN

## 2017-08-17 MED ORDER — VANCOMYCIN HCL 10 G IV SOLR
2000.0000 mg | Freq: Once | INTRAVENOUS | Status: AC
  Administered 2017-08-17: 2000 mg via INTRAVENOUS
  Filled 2017-08-17: qty 2000

## 2017-08-17 MED ORDER — TADALAFIL 20 MG PO TABS
20.0000 mg | ORAL_TABLET | Freq: Every day | ORAL | Status: DC
  Administered 2017-08-17: 20 mg via ORAL
  Filled 2017-08-17 (×2): qty 1

## 2017-08-17 MED ORDER — CALCIUM ACETATE (PHOS BINDER) 667 MG PO CAPS
1334.0000 mg | ORAL_CAPSULE | Freq: Three times a day (TID) | ORAL | Status: DC
Start: 2017-08-17 — End: 2017-08-28
  Administered 2017-08-17: 1334 mg via ORAL
  Filled 2017-08-17 (×9): qty 2

## 2017-08-17 MED ORDER — ONDANSETRON HCL 4 MG/2ML IJ SOLN: 4.0000 mg | Freq: Four times a day (QID) | INTRAMUSCULAR | Status: DC | PRN

## 2017-08-17 MED ORDER — ACETAMINOPHEN 325 MG PO TABS
650.0000 mg | ORAL_TABLET | Freq: Four times a day (QID) | ORAL | Status: DC | PRN
  Administered 2017-08-18: 650 mg via ORAL
  Filled 2017-08-17: qty 2

## 2017-08-17 MED ORDER — SODIUM CHLORIDE 0.9 % IV BOLUS
500.0000 mL | Freq: Once | INTRAVENOUS | Status: AC
  Administered 2017-08-17: 500 mL via INTRAVENOUS

## 2017-08-17 MED ORDER — ATORVASTATIN CALCIUM 40 MG PO TABS
40.0000 mg | ORAL_TABLET | Freq: Every evening | ORAL | Status: DC
  Administered 2017-08-17 – 2017-08-27 (×11): 40 mg via ORAL
  Filled 2017-08-17 (×11): qty 1

## 2017-08-17 MED ORDER — CINACALCET HCL 30 MG PO TABS
90.0000 mg | ORAL_TABLET | Freq: Every evening | ORAL | Status: DC
  Administered 2017-08-18 – 2017-08-27 (×9): 90 mg via ORAL
  Filled 2017-08-17 (×10): qty 3

## 2017-08-17 MED ORDER — ONDANSETRON HCL 4 MG PO TABS: 4.0000 mg | ORAL_TABLET | Freq: Four times a day (QID) | ORAL | Status: DC | PRN

## 2017-08-17 MED ORDER — PROMETHAZINE HCL 25 MG/ML IJ SOLN
12.5000 mg | Freq: Four times a day (QID) | INTRAMUSCULAR | Status: DC | PRN
  Filled 2017-08-17: qty 1

## 2017-08-17 MED ORDER — SODIUM CHLORIDE 0.9 % IV SOLN
1.0000 g | Freq: Once | INTRAVENOUS | Status: AC
  Administered 2017-08-17: 1 g via INTRAVENOUS
  Filled 2017-08-17: qty 10

## 2017-08-17 MED ORDER — MIDODRINE HCL 5 MG PO TABS
10.0000 mg | ORAL_TABLET | ORAL | Status: DC
  Administered 2017-08-17 – 2017-08-28 (×29): 10 mg via ORAL
  Filled 2017-08-17 (×25): qty 2

## 2017-08-17 MED ORDER — ACETAMINOPHEN 650 MG RE SUPP: 650.0000 mg | Freq: Four times a day (QID) | RECTAL | Status: DC | PRN

## 2017-08-17 MED ORDER — MIDODRINE HCL 5 MG PO TABS
10.0000 mg | ORAL_TABLET | Freq: Once | ORAL | Status: AC
  Administered 2017-08-17: 10 mg via ORAL
  Filled 2017-08-17: qty 2

## 2017-08-17 MED ORDER — CALCITRIOL 0.25 MCG PO CAPS
0.2500 ug | ORAL_CAPSULE | Freq: Every day | ORAL | Status: DC
  Administered 2017-08-17 – 2017-08-28 (×8): 0.25 ug via ORAL
  Filled 2017-08-17 (×8): qty 1

## 2017-08-17 MED ORDER — AZITHROMYCIN 500 MG PO TABS
500.0000 mg | ORAL_TABLET | Freq: Every day | ORAL | Status: DC
  Administered 2017-08-17: 500 mg via ORAL
  Filled 2017-08-17: qty 1
  Filled 2017-08-17: qty 2

## 2017-08-17 MED ORDER — LEVOTHYROXINE SODIUM 100 MCG PO TABS
300.0000 ug | ORAL_TABLET | Freq: Every day | ORAL | Status: DC
  Administered 2017-08-17 – 2017-08-27 (×10): 300 ug via ORAL
  Filled 2017-08-17 (×5): qty 3
  Filled 2017-08-17: qty 2
  Filled 2017-08-17 (×5): qty 3

## 2017-08-17 MED ORDER — AZITHROMYCIN 250 MG PO TABS
500.0000 mg | ORAL_TABLET | Freq: Once | ORAL | Status: AC
Start: 2017-08-17 — End: 2017-08-17
  Administered 2017-08-17: 500 mg via ORAL
  Filled 2017-08-17: qty 2

## 2017-08-17 NOTE — ED Notes (Signed)
ED TO INPATIENT HANDOFF REPORT  Name/Age/Gender Adam [name]. 57 y.o. male  Code Status    Code Status Orders  (From admission, onward)        Start     Ordered   08/17/17 1503  Full code  Continuous     08/17/17 1505    Code Status History    Date Active Date Inactive Code Status Order ID Comments User Context   08/05/2016 1628 08/09/2016 1624 Full Code [id number]  Melvenia Needles, NP Inpatient   12/24/2014 1706 12/25/2014 1859 Full Code [id number]  Elam Dutch, MD ED      Home/SNF/Other Home  Chief Complaint SOB / fever   Level of Care/Admitting Diagnosis ED Disposition    ED Disposition Condition [facility] Area: [place] [[id number]]  Level of Care: Stepdown [14]  Diagnosis: Sepsis [facility]) [[id number]]  Admitting Physician: Janece Canterbury [phone]  Attending Physician: Janece Canterbury [phone]  Estimated length of stay: 3 - 4 days  Certification:: I certify this patient will need inpatient services for at least 2 midnights  PT Class (Do Not Modify): Inpatient [101]  PT Acc Code (Do Not Modify): Private [1]       Medical History Past Medical History:  Diagnosis Date  . AICD (automatic cardioverter/defibrillator) present    Medtronic   . Aortic stenosis    moderate AS by 08/2016 echo  . Arthritis    "knees" (08/05/2016)  . Atrial fibrillation ([place])   . ESRD (end stage renal disease) on dialysis ([place])    "M, T, W, T, F; NX stage hemodialysis; I do it at home" (08/05/2016)  . Great toe amputation status ([place])    status post left hallux amputation 08/01/14  . History of blood transfusion 2009   "S/P biopsy for prostate cancer check"  . Hypothyroidism (acquired)   . Mitral stenosis    moderate mitral stenosis  . Nonischemic cardiomyopathy ([place])   . Pneumonia 2014; 08/05/2016  . PONV (postoperative nausea and vomiting)   . Pulmonary HTN ([place])   . Thyroid cancer ([place]) 2011    Allergies Allergies  Allergen Reactions   . Enalaprilat Swelling    Mouth swelling  . Vasotec [Enalapril] Swelling    MOUTH SWELLING  . Ivp Dye [Iodinated Diagnostic Agents] Nausea And Vomiting    IV Location/Drains/Wounds Patient Lines/Drains/Airways Status   Active Line/Drains/Airways    Name:   Placement date:   Placement time:   Site:   Days:   Peripheral IV 08/17/17 Right Forearm   08/17/17    0944    Forearm   less than 1   Fistula / Graft Left Upper arm Arteriovenous fistula   -    -    Upper arm      Hemodialysis Catheter Left Internal jugular Double-lumen;Permanent   11/11/16    1513    Internal jugular   279   Incision (Closed) 11/10/16 Arm Left   11/10/16    1216     280   Incision (Closed) 11/11/16 Neck Left   11/11/16    1519     279   Incision (Closed) 11/11/16 Chest Left   11/11/16    1519     279          Labs/Imaging Results for orders placed or performed during the hospital encounter of 08/17/17 (from the past 48 hour(s))  Comprehensive metabolic panel     Status: Abnormal   Collection Time:  08/17/17  9:02 AM  Result Value Ref Range   Sodium 133 (L) 135 - 145 mmol/L   Potassium 4.2 3.5 - 5.1 mmol/L   Chloride 93 (L) 101 - 111 mmol/L   CO2 21 (L) 22 - 32 mmol/L   Glucose, Bld 106 (H) 65 - 99 mg/dL   BUN 55 (H) 6 - 20 mg/dL   Creatinine, Ser 10.98 (H) 0.61 - 1.24 mg/dL   Calcium 8.2 (L) 8.9 - 10.3 mg/dL   Total Protein 6.8 6.5 - 8.1 g/dL   Albumin 3.4 (L) 3.5 - 5.0 g/dL   AST 24 15 - 41 U/L   ALT 13 (L) 17 - 63 U/L   Alkaline Phosphatase 90 38 - 126 U/L   Total Bilirubin 1.1 0.3 - 1.2 mg/dL   GFR calc non Af Amer 5 (L) >60 mL/min   GFR calc Af Amer 5 (L) >60 mL/min    Comment: (NOTE) The eGFR has been calculated using the CKD EPI equation. This calculation has not been validated in all clinical situations. eGFR's persistently <60 mL/min signify possible Chronic Kidney Disease.    Anion gap 19 (H) 5 - 15    Comment: Performed at [facility], [place] [address]  [place], [place] [address]  CBC with Differential     Status: Abnormal   Collection Time: 08/17/17  9:02 AM  Result Value Ref Range   WBC 12.0 (H) 4.0 - 10.5 K/uL   RBC 3.64 (L) 4.22 - 5.81 MIL/uL   Hemoglobin 10.5 (L) 13.0 - 17.0 g/dL   HCT 33.0 (L) 39.0 - 52.0 %   MCV 90.7 78.0 - 100.0 fL   MCH 28.8 26.0 - 34.0 pg   MCHC 31.8 30.0 - 36.0 g/dL   RDW 18.7 (H) 11.5 - 15.5 %   Platelets 78 (L) 150 - 400 K/uL    Comment: REPEATED TO VERIFY SPECIMEN CHECKED FOR CLOTS PLATELET COUNT CONFIRMED BY SMEAR    Neutrophils Relative % 93 %   Neutro Abs 11.1 (H) 1.7 - 7.7 K/uL   Lymphocytes Relative 4 %   Lymphs Abs 0.5 (L) 0.7 - 4.0 K/uL   Monocytes Relative 3 %   Monocytes Absolute 0.4 0.1 - 1.0 K/uL   Eosinophils Relative 0 %   Eosinophils Absolute 0.0 0.0 - 0.7 K/uL   Basophils Relative 0 %   Basophils Absolute 0.0 0.0 - 0.1 K/uL    Comment: Performed at [facility], [place] [address], [place], [place] [address]  I-Stat CG4 Lactic Acid, ED     Status: Abnormal   Collection Time: 08/17/17  9:47 AM  Result Value Ref Range   Lactic Acid, Venous 1.94 (H) 0.5 - 1.9 mmol/L  I-Stat CG4 Lactic Acid, ED     Status: None   Collection Time: 08/17/17 11:25 AM  Result Value Ref Range   Lactic Acid, Venous 1.60 0.5 - 1.9 mmol/L  Influenza panel by PCR (type A & B)     Status: None   Collection Time: 08/17/17 11:48 AM  Result Value Ref Range   Influenza A By PCR NEGATIVE NEGATIVE   Influenza B By PCR NEGATIVE NEGATIVE    Comment: (NOTE) The Xpert Xpress Flu assay is intended as an aid in the diagnosis of  influenza and should not be used as a sole basis for treatment.  This  assay is FDA approved for nasopharyngeal swab specimens only. Nasal  washings and aspirates are unacceptable for Xpert Xpress Flu testing.  Performed at [facility], [place] [address], [place], [place] [address]    Dg Chest 2 View  Result Date: 08/17/2017 CLINICAL DATA:  Fever and SOB.   Symptoms for 1 week. EXAM: CHEST - 2 VIEW COMPARISON:  11/11/2016. FINDINGS: Cardiomegaly. Dual lead pacer. Mild vascular congestion without overt edema or consolidation. No effusion or pneumothorax. Vascular stent overlies the LEFT axilla. IMPRESSION: Cardiomegaly with mild vascular congestion but no frank edema or consolidation. Similar appearance to priors. Electronically Signed   By: Staci Righter M.D.   On: 08/17/2017 09:24    Pending Labs Unresulted Labs (From admission, onward)   Start     Ordered   08/18/17 0500  Renal function panel  Daily,   R    Question:  Specimen collection method  Answer:  IV Team   08/17/17 1505   08/18/17 0500  CBC  Daily,   R    Question:  Specimen collection method  Answer:  IV Team   08/17/17 1505   08/18/17 0500  Protime-INR  Daily,   R    Question:  Specimen collection method  Answer:  IV Team   08/17/17 1505   08/17/17 1711  Magnesium  Add-on,   R     08/17/17 1710   08/17/17 1513  Protime-INR  STAT,   R    Question:  Specimen collection method  Answer:  IV Team   08/17/17 1512   08/17/17 1505  Culture, sputum-assessment  Once,   R    Question:  Patient immune status  Answer:  Normal   08/17/17 1505   08/17/17 1505  Gram stain  Once,   R    Question:  Patient immune status  Answer:  Normal   08/17/17 1505   08/17/17 1505  Strep pneumoniae urinary antigen  Once,   R     08/17/17 1505   08/17/17 1503  HIV antibody (Routine Testing)  Once,   R    Question:  Specimen collection method  Answer:  IV Team   08/17/17 1505   08/17/17 1130  MRSA PCR Screening  Once,   R    Question:  Patient immune status  Answer:  Normal   08/17/17 1130   08/17/17 0922  Blood culture (routine x 2)  BLOOD CULTURE X 2,   STAT     08/17/17 0921      Vitals/Pain Today's Vitals   08/17/17 1528 08/17/17 1600 08/17/17 1630 08/17/17 1730  BP: 91/66 (!) 89/60 (!) 85/61 101/72  Pulse: 87 90 91   Resp: 19 19 (!) 28 (!) 25  Temp:      TempSrc:      SpO2: 99% 95% 94%    Weight:      Height:      PainSc:        Isolation Precautions Droplet precaution  Medications Medications  cefTRIAXone (ROCEPHIN) 1 g in sodium chloride 0.9 % 100 mL IVPB (has no administration in time range)  ambrisentan (LETAIRIS) tablet 10 mg (has no administration in time range)  amiodarone (PACERONE) tablet 200 mg (has no administration in time range)  atorvastatin (LIPITOR) tablet 40 mg (has no administration in time range)  cinacalcet (SENSIPAR) tablet 90 mg (has no administration in time range)  levothyroxine (SYNTHROID, LEVOTHROID) tablet 300 mcg (300 mcg Oral Given 08/17/17 1720)  midodrine (PROAMATINE) tablet 10 mg (10 mg Oral Given 08/17/17 1719)  tadalafil (CIALIS) tablet 20 mg (20 mg Oral Given 08/17/17 1723)  acetaminophen (TYLENOL)  tablet 650 mg (has no administration in time range)    Or  acetaminophen (TYLENOL) suppository 650 mg (has no administration in time range)  oxyCODONE (Oxy IR/ROXICODONE) immediate release tablet 5 mg (has no administration in time range)  azithromycin (ZITHROMAX) tablet 500 mg (500 mg Oral Given 08/17/17 1723)  calcitRIOL (ROCALTROL) capsule 0.25 mcg (0.25 mcg Oral Given 08/17/17 1720)  calcium acetate (PHOSLO) capsule 1,334 mg (1,334 mg Oral Given 08/17/17 1719)  promethazine (PHENERGAN) injection 12.5 mg (has no administration in time range)  midodrine (PROAMATINE) tablet 10 mg (10 mg Oral Given 08/17/17 1139)  cefTRIAXone (ROCEPHIN) 1 g in sodium chloride 0.9 % 100 mL IVPB (0 g Intravenous Stopped 08/17/17 1313)  azithromycin (ZITHROMAX) tablet 500 mg (500 mg Oral Given 08/17/17 1139)  sodium chloride 0.9 % bolus 500 mL (0 mLs Intravenous Stopped 08/17/17 1313)  vancomycin (VANCOCIN) 2,000 mg in sodium chloride 0.9 % 500 mL IVPB (0 mg Intravenous Stopped 08/17/17 1526)    Mobility walks

## 2017-08-17 NOTE — Consult Note (Signed)
Reason for Consult: To manage dialysis and dialysis related needs Referring Physician: Agapito Games. is an 57 y.o. male with past medical history Significant for obesity, aortic stenosis, atrial fibrillation, Non ischemic cardiomyopathy with ICD in place As well as ESRD. He has been on home nx stage for a year followed by Dr. Pearson Grippe.  The dialysis has been going well- He has an AV fistula and has been using sterile technique. He reports that his blood pressure is often low, as low as 75 systolic and he takes admitted during pretreatment and after treatment to keep it up.  He reports feeling well until yesterday when he developed shortness of breath and fever. He had a mild cough but really no other symptoms. No dysuria, abdominal pain, diarrhea. He also denies sick contacts.  White blood count was 12.  Because blood pressure is low he is being admitted to rule out sepsis. Blood cultures are pending    Dialyzes at Home- Nx stage- he runs 5 days per week, Monday through Friday  EDW 125 kg. HD Bath 2K 50 liters per tx, Dialyzer- NXstage, Heparin yes. Access Left upper AV fistula.  Past Medical History:  Diagnosis Date  . AICD (automatic cardioverter/defibrillator) present    Medtronic   . Aortic stenosis    moderate AS by 08/2016 echo  . Arthritis    "knees" (08/05/2016)  . Atrial fibrillation ([place])   . ESRD (end stage renal disease) on dialysis ([place])    "M, T, W, T, F; NX stage hemodialysis; I do it at home" (08/05/2016)  . Great toe amputation status ([place])    status post left hallux amputation 08/01/14  . History of blood transfusion 2009   "S/P biopsy for prostate cancer check"  . Hypothyroidism (acquired)   . Mitral stenosis    moderate mitral stenosis  . Nonischemic cardiomyopathy ([place])   . Pneumonia 2014; 08/05/2016  . PONV (postoperative nausea and vomiting)   . Pulmonary HTN ([place])   . Thyroid cancer ([place]) 2011    Past Surgical History:  Procedure Laterality  Date  . CARDIAC CATHETERIZATION Right 2017  . CARDIAC DEFIBRILLATOR PLACEMENT  2009  . DIALYSIS FISTULA CREATION Left 2003   "upper arm"  . HERNIA REPAIR    . INSERTION OF DIALYSIS CATHETER N/A 11/11/2016   Procedure: INSERTION OF TUNNELED DIALYSIS CATHETER;  Surgeon: Angelia Mould, MD;  Location: [place];  Service: Vascular;  Laterality: N/A;  . KIDNEY TRANSPLANT  2009  . KNEE SURGERY Bilateral 2000s   "drained fluid"  . LAPAROSCOPIC GASTRIC BANDING  2006  . PROSTATE BIOPSY  2009  . REVISON OF ARTERIOVENOUS FISTULA Left 3/64/6803   Procedure: PLICATION OF ARTERIOVENOUS FISTULA ANEURYSM;  Surgeon: Elam Dutch, MD;  Location: [place];  Service: Vascular;  Laterality: Left;  . REVISON OF ARTERIOVENOUS FISTULA Left 11/10/2016   Procedure: REVISON OF ARTERIOVENOUS FISTULA - LEFT UPPER ARM;  Surgeon: Elam Dutch, MD;  Location: [place];  Service: Vascular;  Laterality: Left;  . THROMBECTOMY W/ EMBOLECTOMY Left 12/25/2014   Procedure: THROMBECTOMY ARTERIOVENOUS FISTULA;  Surgeon: Elam Dutch, MD;  Location: [facility];  Service: Vascular;  Laterality: Left;  . THYROIDECTOMY  2011  . TOE AMPUTATION Bilateral     right 1st and  2nd digits; left 1st and 3rd digits"  . UMBILICAL HERNIA REPAIR  X 2    Family History  Problem Relation Age of Onset  . Heart failure Mother   . Hypertension  Mother   . CAD Mother 90  . Emphysema Mother   . Hypertension Father   . Kidney failure Father     Social History:  reports that he has never smoked. He has never used smokeless tobacco. He reports that he drinks alcohol. He reports that he does not use drugs.  Allergies:  Allergies  Allergen Reactions  . Enalaprilat Swelling    Mouth swelling  . Vasotec [Enalapril] Swelling    MOUTH SWELLING  . Ivp Dye [Iodinated Diagnostic Agents] Nausea And Vomiting    Medications: I have reviewed the patient's current medications. phoslo 2-4 TID with meals Calcitriol 0.25 daily  sensipar 90 daily   Midodrine 10 pre and post HD   Results for orders placed or performed during the hospital encounter of 08/17/17 (from the past 48 hour(s))  Comprehensive metabolic panel     Status: Abnormal   Collection Time: 08/17/17  9:02 AM  Result Value Ref Range   Sodium 133 (L) 135 - 145 mmol/L   Potassium 4.2 3.5 - 5.1 mmol/L   Chloride 93 (L) 101 - 111 mmol/L   CO2 21 (L) 22 - 32 mmol/L   Glucose, Bld 106 (H) 65 - 99 mg/dL   BUN 55 (H) 6 - 20 mg/dL   Creatinine, Ser 10.98 (H) 0.61 - 1.24 mg/dL   Calcium 8.2 (L) 8.9 - 10.3 mg/dL   Total Protein 6.8 6.5 - 8.1 g/dL   Albumin 3.4 (L) 3.5 - 5.0 g/dL   AST 24 15 - 41 U/L   ALT 13 (L) 17 - 63 U/L   Alkaline Phosphatase 90 38 - 126 U/L   Total Bilirubin 1.1 0.3 - 1.2 mg/dL   GFR calc non Af Amer 5 (L) >60 mL/min   GFR calc Af Amer 5 (L) >60 mL/min    Comment: (NOTE) The eGFR has been calculated using the CKD EPI equation. This calculation has not been validated in all clinical situations. eGFR's persistently <60 mL/min signify possible Chronic Kidney Disease.    Anion gap 19 (H) 5 - 15    Comment: Performed at [facility], [place] [address] [place], [place] [address]  CBC with Differential     Status: Abnormal   Collection Time: 08/17/17  9:02 AM  Result Value Ref Range   WBC 12.0 (H) 4.0 - 10.5 K/uL   RBC 3.64 (L) 4.22 - 5.81 MIL/uL   Hemoglobin 10.5 (L) 13.0 - 17.0 g/dL   HCT 33.0 (L) 39.0 - 52.0 %   MCV 90.7 78.0 - 100.0 fL   MCH 28.8 26.0 - 34.0 pg   MCHC 31.8 30.0 - 36.0 g/dL   RDW 18.7 (H) 11.5 - 15.5 %   Platelets 78 (L) 150 - 400 K/uL    Comment: REPEATED TO VERIFY SPECIMEN CHECKED FOR CLOTS PLATELET COUNT CONFIRMED BY SMEAR    Neutrophils Relative % 93 %   Neutro Abs 11.1 (H) 1.7 - 7.7 K/uL   Lymphocytes Relative 4 %   Lymphs Abs 0.5 (L) 0.7 - 4.0 K/uL   Monocytes Relative 3 %   Monocytes Absolute 0.4 0.1 - 1.0 K/uL   Eosinophils Relative 0 %   Eosinophils Absolute 0.0 0.0 - 0.7 K/uL   Basophils  Relative 0 %   Basophils Absolute 0.0 0.0 - 0.1 K/uL    Comment: Performed at [facility], [place] [address], [place], [place] [address]  I-Stat CG4 Lactic Acid, ED     Status: Abnormal  Collection Time: 08/17/17  9:47 AM  Result Value Ref Range   Lactic Acid, Venous 1.94 (H) 0.5 - 1.9 mmol/L  I-Stat CG4 Lactic Acid, ED     Status: None   Collection Time: 08/17/17 11:25 AM  Result Value Ref Range   Lactic Acid, Venous 1.60 0.5 - 1.9 mmol/L  Influenza panel by PCR (type A & B)     Status: None   Collection Time: 08/17/17 11:48 AM  Result Value Ref Range   Influenza A By PCR NEGATIVE NEGATIVE   Influenza B By PCR NEGATIVE NEGATIVE    Comment: (NOTE) The Xpert Xpress Flu assay is intended as an aid in the diagnosis of  influenza and should not be used as a sole basis for treatment.  This  assay is FDA approved for nasopharyngeal swab specimens only. Nasal  washings and aspirates are unacceptable for Xpert Xpress Flu testing. Performed at [facility], [place] [address], [place], [place] [address]     Dg Chest 2 View  Result Date: 08/17/2017 CLINICAL DATA:  Fever and SOB.  Symptoms for 1 week. EXAM: CHEST - 2 VIEW COMPARISON:  11/11/2016. FINDINGS: Cardiomegaly. Dual lead pacer. Mild vascular congestion without overt edema or consolidation. No effusion or pneumothorax. Vascular stent overlies the LEFT axilla. IMPRESSION: Cardiomegaly with mild vascular congestion but no frank edema or consolidation. Similar appearance to priors. Electronically Signed   By: Staci Righter M.D.   On: 08/17/2017 09:24    ROS: somnolent, fever, SOB- better with O2 Blood pressure 90/62, pulse 88, temperature 98.9 F (37.2 C), temperature source Oral, resp. rate 18, height '6\' 1"'$  (1.854 m), weight 122.5 kg (270 lb), SpO2 94 %. General appearance: alert, cooperative, moderately obese and slowed mentation Resp: clear to auscultation bilaterally Cardio: regular rate and rhythm,  S1, S2 normal, no murmur, click, rub or gallop GI: soft, non-tender; bowel sounds normal; no masses,  no organomegaly Extremities: edema 1 plus left upper arm AVF- good thrill and bruit- no signs of infection   Assessment/Plan: 57 year old BM with multiple medical problems including ESRD- Presenting with fever and shortness of breath with elevated white count and low blood pressure 1 Fever and elevated white blood count- the presence of low blood pressure although is typical for the patient brings to mind possible sepsis syndrome. Cultures obtained and patient will be admitted to watch- Given Rocephin, a Zithromax and vancomycin 2 ESRD: Normally does home hemodialysis 5 days per week. Is due today. Will write orders for him to be done.  Fistula does not appear to be source of infection 3 Hypertension: Patient is actually normally baseline hypotensive. He takes Midodrine pre-and posttreatment- This practice will be continued here. He does have some lower extremity edema so will ultrafilter gently 4. Anemia of ESRD: Hemoglobin at present 10.4. Will obtain his home regimen- recently given venofer- mircera 225 given 3/28 5. Metabolic Bone Disease: Is reportedly on sensipar 90 mg daily, phoslo 2-4 TID and calcitriol 0.25 daily   Aimie Wagman A 08/17/2017, 2:02 PM

## 2017-08-17 NOTE — ED Provider Notes (Signed)
[facility] DEPT Provider Note   CSN: [id number] Arrival date & time: 08/17/17  1696     History   Chief Complaint Chief Complaint  Patient presents with  . Fever and shortness of breath    HPI Adam [name]. is a 57 y.o. male.  HPI Patient presents to the emergency room for evaluation of shortness of breath.  Patient states for the past week or so he has had some trouble with cough and congestion.  He has a history of recurrent sinus issues so he was treating himself for that with over-the-counter medications.  Symptoms have progressed and he continues to feel more short of breath.  Shortness of breath occurs at rest and with exertion.  This morning the symptoms were much more severe and he had a temperature up to 102.  He denies any chest pain or abdominal pain.    Patient is on dialysis.  He gets hemodialysis at home.  His last treatment was on Friday.  Patient is due for dialysis again today.   Past Medical History:  Diagnosis Date  . AICD (automatic cardioverter/defibrillator) present    Medtronic   . Aortic stenosis    moderate AS by 08/2016 echo  . Arthritis    "knees" (08/05/2016)  . Atrial fibrillation ([place])   . ESRD (end stage renal disease) on dialysis ([place])    "M, T, W, T, F; NX stage hemodialysis; I do it at home" (08/05/2016)  . Great toe amputation status ([place])    status post left hallux amputation 08/01/14  . History of blood transfusion 2009   "S/P biopsy for prostate cancer check"  . Hypothyroidism (acquired)   . Mitral stenosis    moderate mitral stenosis  . Nonischemic cardiomyopathy ([place])   . Pneumonia 2014; 08/05/2016  . PONV (postoperative nausea and vomiting)   . Pulmonary HTN ([place])   . Thyroid cancer [facility]) 2011    Patient Active Problem List   Diagnosis Date Noted  . Lymphadenopathy, mediastinal 04/10/2017  . Hypothyroidism 10/09/2016  . Acute respiratory failure with hypoxia ([place]) 08/06/2016  . HCAP  (healthcare-associated pneumonia) 08/05/2016  . Thyroid cancer ([place]) 06/12/2016  . Epistaxis 03/04/2016  . Long term current use of amiodarone 01/10/2016  . Critical lower limb ischemia 12/04/2015  . Carotid artery stenosis 05/24/2015  . Excess skin 05/24/2015  . Osteomyelitis of left foot ([place]) 04/30/2015  . Status post amputation 04/19/2015  . Wound dehiscence 04/19/2015  . Sinus congestion 03/08/2015  . Bleeding pseudoaneurysm of left brachiocephalic arteriovenous fistula ([place]) 12/24/2014  . Pure hypercholesterolemia 11/09/2014  . History of thyroid cancer 11/07/2014  . Obesity 11/07/2014  . Healthcare maintenance 11/07/2014  . H/O malignant neoplasm of thyroid 11/07/2014  . Toe amputation status ([place]) 10/13/2014  . ICD (implantable cardioverter-defibrillator) in place 07/25/2014  . ESRD (end stage renal disease) on hemodialysis 07/25/2014  . End-stage renal disease ([place]) 07/25/2014  . Automatic implantable cardioverter-defibrillator in situ 07/25/2014  . Pulmonary hypertension ([place]) 2020-07-2614  . Secondary pulmonary hypertension 2020-07-2614  . Atrial fibrillation [I48.91] 05/24/2014  . Long-term (current) use of anticoagulants 05/24/2014    Past Surgical History:  Procedure Laterality Date  . CARDIAC CATHETERIZATION Right 2017  . CARDIAC DEFIBRILLATOR PLACEMENT  2009  . DIALYSIS FISTULA CREATION Left 2003   "upper arm"  . HERNIA REPAIR    . INSERTION OF DIALYSIS CATHETER N/A 11/11/2016   Procedure: INSERTION OF TUNNELED DIALYSIS CATHETER;  Surgeon: Angelia Mould, MD;  Location: [facility];  Service: Vascular;  Laterality: N/A;  . KIDNEY TRANSPLANT  2009  . KNEE SURGERY Bilateral 2000s   "drained fluid"  . LAPAROSCOPIC GASTRIC BANDING  2006  . PROSTATE BIOPSY  2009  . REVISON OF ARTERIOVENOUS FISTULA Left 6/96/2952   Procedure: PLICATION OF ARTERIOVENOUS FISTULA ANEURYSM;  Surgeon: Elam Dutch, MD;  Location: [facility];  Service: Vascular;  Laterality: Left;  .  REVISON OF ARTERIOVENOUS FISTULA Left 11/10/2016   Procedure: REVISON OF ARTERIOVENOUS FISTULA - LEFT UPPER ARM;  Surgeon: Elam Dutch, MD;  Location: [facility];  Service: Vascular;  Laterality: Left;  . THROMBECTOMY W/ EMBOLECTOMY Left 12/25/2014   Procedure: THROMBECTOMY ARTERIOVENOUS FISTULA;  Surgeon: Elam Dutch, MD;  Location: [facility];  Service: Vascular;  Laterality: Left;  . THYROIDECTOMY  2011  . TOE AMPUTATION Bilateral     right 1st and  2nd digits; left 1st and 3rd digits"  . UMBILICAL HERNIA REPAIR  X 2        Home Medications    Prior to Admission medications   Medication Sig Start Date End Date Taking? Authorizing Provider  ambrisentan (LETAIRIS) 10 MG tablet Take 1 tablet (10 mg total) by mouth daily. 01/16/17  Yes Juanito Doom, MD  amiodarone (PACERONE) 200 MG tablet Take 200 mg by mouth every evening.    Yes [provider]  atorvastatin (LIPITOR) 40 MG tablet Take 1 tablet (40 mg total) by mouth every evening. 06/15/17  Yes Diallo, Abdoulaye, MD  cinacalcet (SENSIPAR) 90 MG tablet Take 90 mg by mouth every evening.    Yes [provider]  levothyroxine (SYNTHROID, LEVOTHROID) 300 MCG tablet Take 1 tablet (300 mcg total) by mouth daily. 06/09/17  Yes Renato Shin, MD  midodrine (PROAMATINE) 10 MG tablet Take 1 tablet (10 mg total) by mouth 3 (three) times daily. 09/02/16  Yes Minus Breeding, MD  tadalafil, PAH, (ADCIRCA) 20 MG tablet 1 tab daily 12/05/16  Yes Juanito Doom, MD  warfarin (COUMADIN) 6 MG tablet Take 6-9 mg by mouth See admin instructions. Take 1.5 tablets (9 mg) by mouth in the evening, except take 1 tablet (6 mg) in the evening on Sunday. 10/18/16  Yes [provider]    Family History Family History  Problem Relation Age of Onset  . Heart failure Mother   . Hypertension Mother   . CAD Mother 13  . Emphysema Mother   . Hypertension Father   . Kidney failure Father     Social History Social History   Tobacco  Use  . Smoking status: Never Smoker  . Smokeless tobacco: Never Used  Substance Use Topics  . Alcohol use: Yes    Alcohol/week: 0.0 oz    Comment: 08/05/2016 "might have 1 shot/month of liquor"  . Drug use: No     Allergies   Enalaprilat; Vasotec [enalapril]; and Ivp dye [iodinated diagnostic agents]   Review of Systems Review of Systems  All other systems reviewed and are negative.    Physical Exam Updated Vital Signs BP (!) 89/61   Pulse 99   Temp 98.9 F (37.2 C) (Oral)   Resp (!) 21   Ht 1.854 m (6\' 1" )   Wt 122.5 kg (270 lb)   SpO2 94%   BMI 35.62 kg/m   Physical Exam  Constitutional: He appears well-developed and well-nourished. No distress.  HENT:  Head: Normocephalic and atraumatic.  Right Ear: External ear normal.  Left Ear: External ear normal.  Eyes: Conjunctivae are normal. Right  eye exhibits no discharge. Left eye exhibits no discharge. No scleral icterus.  Neck: Neck supple. No tracheal deviation present.  Cardiovascular: Regular rhythm and intact distal pulses. Tachycardia present.  Pulmonary/Chest: Effort normal and breath sounds normal. No stridor. No respiratory distress. He has no wheezes. He has no rales.  Abdominal: Soft. Bowel sounds are normal. He exhibits no distension. There is no tenderness. There is no rebound and no guarding.  Musculoskeletal: He exhibits no edema or tenderness.  Neurological: He is alert. He has normal strength. No cranial nerve deficit (no facial droop, extraocular movements intact, no slurred speech) or sensory deficit. He exhibits normal muscle tone. He displays no seizure activity. Coordination normal.  Skin: Skin is warm and dry. No rash noted.  Psychiatric: He has a normal mood and affect.  Nursing note and vitals reviewed.    ED Treatments / Results  Labs (all labs ordered are listed, but only abnormal results are displayed) Labs Reviewed  COMPREHENSIVE METABOLIC PANEL - Abnormal; Notable for the following  components:      Result Value   Sodium 133 (*)    Chloride 93 (*)    CO2 21 (*)    Glucose, Bld 106 (*)    BUN 55 (*)    Creatinine, Ser 10.98 (*)    Calcium 8.2 (*)    Albumin 3.4 (*)    ALT 13 (*)    GFR calc non Af Amer 5 (*)    GFR calc Af Amer 5 (*)    Anion gap 19 (*)    All other components within normal limits  CBC WITH DIFFERENTIAL/PLATELET - Abnormal; Notable for the following components:   WBC 12.0 (*)    RBC 3.64 (*)    Hemoglobin 10.5 (*)    HCT 33.0 (*)    RDW 18.7 (*)    Platelets 78 (*)    Neutro Abs 11.1 (*)    Lymphs Abs 0.5 (*)    All other components within normal limits  I-STAT CG4 LACTIC ACID, ED - Abnormal; Notable for the following components:   Lactic Acid, Venous 1.94 (*)    All other components within normal limits  CULTURE, BLOOD (ROUTINE X 2)  CULTURE, BLOOD (ROUTINE X 2)  I-STAT CG4 LACTIC ACID, ED    EKG None  Radiology Dg Chest 2 View  Result Date: 08/17/2017 CLINICAL DATA:  Fever and SOB.  Symptoms for 1 week. EXAM: CHEST - 2 VIEW COMPARISON:  11/11/2016. FINDINGS: Cardiomegaly. Dual lead pacer. Mild vascular congestion without overt edema or consolidation. No effusion or pneumothorax. Vascular stent overlies the LEFT axilla. IMPRESSION: Cardiomegaly with mild vascular congestion but no frank edema or consolidation. Similar appearance to priors. Electronically Signed   By: Staci Righter M.D.   On: 08/17/2017 09:24    Procedures .Critical Care Performed by: Dorie Rank, MD Authorized by: Dorie Rank, MD   Critical care provider statement:    Critical care time (minutes):  35   Critical care was time spent personally by me on the following activities:  Discussions with consultants, evaluation of patient's response to treatment, examination of patient, ordering and performing treatments and interventions, ordering and review of laboratory studies, ordering and review of radiographic studies, pulse oximetry, re-evaluation of patient's  condition, obtaining history from patient or surrogate and review of old charts   (including critical care time)  Medications Ordered in ED Medications  midodrine (PROAMATINE) tablet 10 mg (has no administration in time range)  cefTRIAXone (ROCEPHIN) 1 g  in sodium chloride 0.9 % 100 mL IVPB (has no administration in time range)  azithromycin (ZITHROMAX) tablet 500 mg (has no administration in time range)  sodium chloride 0.9 % bolus 500 mL (has no administration in time range)     Initial Impression / Assessment and Plan / ED Course  I have reviewed the triage vital signs and the nursing notes.  Pertinent labs & imaging results that were available during my care of the patient were reviewed by me and considered in my medical decision making (see chart for details).  Clinical Course as of Aug 18 1119  Mon Aug 17, 2017  1112  His blood pressure is in the high 80s now.  Patient states normally he does have low blood pressure and has to take Midrin.  This is also the reason he does dialysis Monday through Friday.   [ZO]  1096 Patient otherwise appears comfortable.  Sepsis is a concern with his complaints of fever and low blood pressure however I suspect his blood pressure may be related to his chronic hypotension.  Patient will be started on empiric antibiotics.  He does have an oxygen requirement now that is new.  This may be related to volume overload although clinical pneumonia is a possibility   [EA]  5409 Patient's initial lactic acid level is not above 2.  He is afebrile here.  I will hold off on a full fluid bolus right now considering his renal failure and vascular congestion.  We will continue to monitor closely for any clinical decline   [JK]    Clinical Course User Index [JK] Dorie Rank, MD   Patient presented to the emergency room for evaluation of flulike symptoms.  No definite pneumonia on x-ray.  It is possible he could have a viral illness.  Bacteremia is also a concern  considering his hemodialysis.  Patient was noted to a new oxygen requirement.  He had a fever at home today.  Patient does have borderline blood pressures although this is a chronic issue for him.  I am concerned about the possibility of early sepsis although overall right now he appears well.  Patient has been started on antibiotics.  Cultures have been sent off for analysis.  Final Clinical Impressions(s) / ED Diagnoses   Final diagnoses:  Febrile illness  Stage 5 chronic kidney disease on chronic dialysis ([place])  Hypotension, unspecified hypotension type       Dorie Rank, MD 08/17/17 1121

## 2017-08-17 NOTE — Progress Notes (Signed)
ANTICOAGULATION CONSULT NOTE - Initial Consult  Pharmacy Consult for Warfarin Indication: atrial fibrillation  Allergies  Allergen Reactions  . Enalaprilat Swelling    Mouth swelling  . Vasotec [Enalapril] Swelling    MOUTH SWELLING  . Ivp Dye [Iodinated Diagnostic Agents] Nausea And Vomiting    Patient Measurements: Height: 6\' 1"  (185.4 cm) Weight: 270 lb (122.5 kg) IBW/kg (Calculated) : 79.9   Vital Signs: Temp: 98.8 F (37.1 C) (04/08 1922) Temp Source: Oral (04/08 1922) BP: 103/68 (04/08 1922) Pulse Rate: 89 (04/08 1830)  Labs: Recent Labs    08/17/17 0902 08/17/17 1838  HGB 10.5*  --   HCT 33.0*  --   PLT 78*  --   LABPROT  --  37.8*  INR  --  3.88  CREATININE 10.98*  --     Estimated Creatinine Clearance: 10.5 mL/min (A) (by C-G formula based on SCr of 10.98 mg/dL (H)).   Medical History: Past Medical History:  Diagnosis Date  . AICD (automatic cardioverter/defibrillator) present    Medtronic   . Aortic stenosis    moderate AS by 08/2016 echo  . Arthritis    "knees" (08/05/2016)  . Atrial fibrillation ([place])   . ESRD (end stage renal disease) on dialysis ([place])    "M, T, W, T, F; NX stage hemodialysis; I do it at home" (08/05/2016)  . Great toe amputation status ([place])    status post left hallux amputation 08/01/14  . History of blood transfusion 2009   "S/P biopsy for prostate cancer check"  . Hypothyroidism (acquired)   . Mitral stenosis    moderate mitral stenosis  . Nonischemic cardiomyopathy ([place])   . Pneumonia 2014; 08/05/2016  . PONV (postoperative nausea and vomiting)   . Pulmonary HTN ([place])   . Thyroid cancer ([place]) 2011    Assessment: 54 y/oM with PMH of ESRD, hypothyroidism, pulmonary hypertension, AS, MS, and paroxysmal atrial fibrillation on chronic warfarin at home being admitted for sepsis likely 2/2 PNA, ? bacteremia. Pharmacy consulted to manage warfarin therapy while patient admitted. PTA dose reported as 9mg  daily except 6mg  on  Sundays with last dose 4/7 at 1800. Admission Hgb 10.5, Pltc low at 78K (appears chronic, but lower than previous values). INR 3.88.    Goal of Therapy:  INR 2-3 Monitor platelets by anticoagulation protocol: Yes   Plan:  Hold warfarin tonight.  Daily PT/INR Monitor closely for s/sx of bleeding    Lindell Spar, PharmD, BCPS Pager: [phone] 08/17/2017 3:32 PM

## 2017-08-17 NOTE — Progress Notes (Signed)
Pharmacy Antibiotic Note  Adam [name]. is a 57 y.o. male admitted on 08/17/2017 with CAP.  Pharmacy has been consulted for Vancomycin, Ceftriaxone dosing.  Afebrile WBC 12 SCr 10.98 - Pt reports getting hemodialysis at home, last session on 4/5, next due on 4/8.  Plan:  Ceftriaxone 1g IV q24h  Vancomycin 2000 mg IV x1 then further doses pending renal function.  Measure Vanc trough at steady state.  Follow up renal fxn, culture results, and clinical course.    Height: 6\' 1"  (185.4 cm) Weight: 270 lb (122.5 kg) IBW/kg (Calculated) : 79.9  Temp (24hrs), Avg:98.9 F (37.2 C), Min:98.9 F (37.2 C), Max:98.9 F (37.2 C)  Recent Labs  Lab 08/17/17 0902 08/17/17 0947 08/17/17 1125  WBC 12.0*  --   --   CREATININE 10.98*  --   --   LATICACIDVEN  --  1.94* 1.60    Estimated Creatinine Clearance: 10.5 mL/min (A) (by C-G formula based on SCr of 10.98 mg/dL (H)).    Allergies  Allergen Reactions  . Enalaprilat Swelling    Mouth swelling  . Vasotec [Enalapril] Swelling    MOUTH SWELLING  . Ivp Dye [Iodinated Diagnostic Agents] Nausea And Vomiting    Thank you for allowing pharmacy to be a part of this patient's care.  Leeroy Bock 08/17/2017 11:42 AM

## 2017-08-17 NOTE — H&P (Addendum)
History and Physical    Adam [name]. VOZ:[id number] DOB: 04-25-1961 DOA: 08/17/2017  Referring MD/NP/PA: Dorie Rank PCP: [name], MD   Patient coming from: home  Chief Complaint: Fever, shortness of breath  HPI: Adam [name]. is a 57 y.o. male with history of end-stage renal disease on home hemodialysis Monday, Tuesday, Wednesday, Thursday, and Friday secondary to chronic hypotension, nonischemic cardiomyopathy, pulmonary hypertension, moderate aortic stenosis, AICD placement who presents with a one-week history of progressively worsening shortness of breath, nonproductive cough, fatigue.  Yesterday, he developed fever to 102.8 Fahrenheit and he had fever again this morning.  According to his wife, he felt warm to touch several days ago and may have had a fever just not realized it.  He is also had some sinus congestion without sore throat.  Mild nausea without vomiting or diarrhea or abdominal pains.  He has been able to complete his dialysis every day of this week.  He has not missed any sessions.  He has not noticed any skin swelling or redness or ulcers.  His fistula has been working fine and he has not had any problems with access.  ED Course: Vital signs heart rate initially in the 110s, hypotensive to the 42V systolic, breathing comfortably but hypoxic on room air to 87%.  Oxygen saturations came up to the mid 90s on 2 L nasal cannula.  Labs: Consistent with someone who is on hemodialysis.  Lactic acid initially 1.94 with a white blood cell count of 12.  Patient is anuric.  Chest x-ray demonstrated cardiomegaly with mild vascular congestion but no frank edema, similar to prior.  Flu PCR is negative.  He was started on ceftriaxone and azithromycin for possible community-acquired pneumonia.  Due to the possibility of bacteremia from his hemodialysis I have added vancomycin.    Review of Systems:  Complete 12 point review of systems reviewed with patient and negative  except as mentioned above.    Past Medical History:  Diagnosis Date  . AICD (automatic cardioverter/defibrillator) present    [employer]   . Aortic stenosis    moderate AS by 08/2016 echo  . Arthritis    "knees" (08/05/2016)  . Atrial fibrillation ([place])   . ESRD (end stage renal disease) on dialysis ([place])    "M, T, W, T, F; NX stage hemodialysis; I do it at home" (08/05/2016)  . Great toe amputation status ([place])    status post left hallux amputation 08/01/14  . History of blood transfusion 2009   "S/P biopsy for prostate cancer check"  . Hypothyroidism (acquired)   . Mitral stenosis    moderate mitral stenosis  . Nonischemic cardiomyopathy ([place])   . Pneumonia 2014; 08/05/2016  . PONV (postoperative nausea and vomiting)   . Pulmonary HTN ([place])   . Thyroid cancer ([place]) 2011    Past Surgical History:  Procedure Laterality Date  . CARDIAC CATHETERIZATION Right 2017  . CARDIAC DEFIBRILLATOR PLACEMENT  2009  . DIALYSIS FISTULA CREATION Left 2003   "upper arm"  . HERNIA REPAIR    . INSERTION OF DIALYSIS CATHETER N/A 11/11/2016   Procedure: INSERTION OF TUNNELED DIALYSIS CATHETER;  Surgeon: Angelia Mould, MD;  Location: [facility];  Service: Vascular;  Laterality: N/A;  . KIDNEY TRANSPLANT  2009  . KNEE SURGERY Bilateral 2000s   "drained fluid"  . LAPAROSCOPIC GASTRIC BANDING  2006  . PROSTATE BIOPSY  2009  . REVISON OF ARTERIOVENOUS FISTULA Left 9/56/3875   Procedure: PLICATION OF ARTERIOVENOUS FISTULA  ANEURYSM;  Surgeon: Elam Dutch, MD;  Location: [facility];  Service: Vascular;  Laterality: Left;  . REVISON OF ARTERIOVENOUS FISTULA Left 11/10/2016   Procedure: REVISON OF ARTERIOVENOUS FISTULA - LEFT UPPER ARM;  Surgeon: Elam Dutch, MD;  Location: [facility];  Service: Vascular;  Laterality: Left;  . THROMBECTOMY W/ EMBOLECTOMY Left 12/25/2014   Procedure: THROMBECTOMY ARTERIOVENOUS FISTULA;  Surgeon: Elam Dutch, MD;  Location: [facility];  Service: Vascular;  Laterality: Left;    . THYROIDECTOMY  2011  . TOE AMPUTATION Bilateral     right 1st and  2nd digits; left 1st and 3rd digits"  . UMBILICAL HERNIA REPAIR  X 2     reports that he has never smoked. He has never used smokeless tobacco. He reports that he drinks alcohol. He reports that he does not use drugs.  Allergies  Allergen Reactions  . Enalaprilat Swelling    Mouth swelling  . Vasotec [Enalapril] Swelling    MOUTH SWELLING  . Ivp Dye [Iodinated Diagnostic Agents] Nausea And Vomiting    Family History  Problem Relation Age of Onset  . Heart failure Mother   . Hypertension Mother   . CAD Mother 32  . Emphysema Mother   . Hypertension Father   . Kidney failure Father     Prior to Admission medications   Medication Sig Start Date End Date Taking? Authorizing Provider  ambrisentan (LETAIRIS) 10 MG tablet Take 1 tablet (10 mg total) by mouth daily. 01/16/17  Yes Juanito Doom, MD  amiodarone (PACERONE) 200 MG tablet Take 200 mg by mouth every evening.    Yes [provider]  atorvastatin (LIPITOR) 40 MG tablet Take 1 tablet (40 mg total) by mouth every evening. 06/15/17  Yes Diallo, Abdoulaye, MD  cinacalcet (SENSIPAR) 90 MG tablet Take 90 mg by mouth every evening.    Yes [provider]  levothyroxine (SYNTHROID, LEVOTHROID) 300 MCG tablet Take 1 tablet (300 mcg total) by mouth daily. 06/09/17  Yes Renato Shin, MD  midodrine (PROAMATINE) 10 MG tablet Take 1 tablet (10 mg total) by mouth 3 (three) times daily. 09/02/16  Yes Minus Breeding, MD  tadalafil, PAH, (ADCIRCA) 20 MG tablet 1 tab daily 12/05/16  Yes Juanito Doom, MD  warfarin (COUMADIN) 6 MG tablet Take 6-9 mg by mouth See admin instructions. Take 1.5 tablets (9 mg) by mouth in the evening, except take 1 tablet (6 mg) in the evening on Sunday. 10/18/16  Yes [provider]    Physical Exam: Vitals:   08/17/17 1349 08/17/17 1400 08/17/17 1430 08/17/17 1500  BP: 90/62 (!) 88/63 (!) 84/64 (!) 85/68  Pulse:  88 88 90 87  Resp: 18     Temp:      TempSrc:      SpO2: 94% 94% 96% 94%  Weight:      Height:        Constitutional: Temporal wasting with abdominal obesity, NAD, calm, comfortable Eyes: PERRL, lids and conjunctivae normal ENMT:  Moist mucous membranes.  Oropharynx nonerythematous, no exudates.   Neck:  No nuchal rigidity, no masses Respiratory: Faint wheeze, particularly audible when coughing, no focal rales or rhonchi Cardiovascular: Regular rate and rhythm, 3 out of 6 systolic murmur at the right upper sternal border.  2+ radial pulses. Abdomen:  Normal active bowel sounds, soft, nondistended, nontender Musculoskeletal: Normal muscle tone and bulk.  No contractures.  Skin:  no rashes, abrasions, or ulcers.  Fistula has a strong bruit and  thrill in the left upper arm Neurologic:  CN 2-12 grossly intact. Sensation intact to light touch, strength 5/5 throughout Psychiatric:  Alert and oriented x 3. Normal affect.   Labs on Admission: I have personally reviewed following labs and imaging studies  CBC: Recent Labs  Lab 08/17/17 0902  WBC 12.0*  NEUTROABS 11.1*  HGB 10.5*  HCT 33.0*  MCV 90.7  PLT 78*   Basic Metabolic Panel: Recent Labs  Lab 08/17/17 0902  NA 133*  K 4.2  CL 93*  CO2 21*  GLUCOSE 106*  BUN 55*  CREATININE 10.98*  CALCIUM 8.2*   GFR: Estimated Creatinine Clearance: 10.5 mL/min (A) (by C-G formula based on SCr of 10.98 mg/dL (H)). Liver Function Tests: Recent Labs  Lab 08/17/17 0902  AST 24  ALT 13*  ALKPHOS 90  BILITOT 1.1  PROT 6.8  ALBUMIN 3.4*   No results for input(s): LIPASE, AMYLASE in the last 168 hours. No results for input(s): AMMONIA in the last 168 hours. Coagulation Profile: No results for input(s): INR, PROTIME in the last 168 hours. Cardiac Enzymes: No results for input(s): CKTOTAL, CKMB, CKMBINDEX, TROPONINI in the last 168 hours. BNP (last 3 results) No results for input(s): PROBNP in the last 8760 hours. HbA1C: No  results for input(s): HGBA1C in the last 72 hours. CBG: No results for input(s): GLUCAP in the last 168 hours. Lipid Profile: No results for input(s): CHOL, HDL, LDLCALC, TRIG, CHOLHDL, LDLDIRECT in the last 72 hours. Thyroid Function Tests: No results for input(s): TSH, T4TOTAL, FREET4, T3FREE, THYROIDAB in the last 72 hours. Anemia Panel: No results for input(s): VITAMINB12, FOLATE, FERRITIN, TIBC, IRON, RETICCTPCT in the last 72 hours. Urine analysis: No results found for: COLORURINE, APPEARANCEUR, LABSPEC, PHURINE, GLUCOSEU, HGBUR, BILIRUBINUR, KETONESUR, PROTEINUR, UROBILINOGEN, NITRITE, LEUKOCYTESUR Sepsis Labs: @LABRCNTIP (procalcitonin:4,lacticidven:4) )No results found for this or any previous visit (from the past 240 hour(s)).   Radiological Exams on Admission: Dg Chest 2 View  Result Date: 08/17/2017 CLINICAL DATA:  Fever and SOB.  Symptoms for 1 week. EXAM: CHEST - 2 VIEW COMPARISON:  11/11/2016. FINDINGS: Cardiomegaly. Dual lead pacer. Mild vascular congestion without overt edema or consolidation. No effusion or pneumothorax. Vascular stent overlies the LEFT axilla. IMPRESSION: Cardiomegaly with mild vascular congestion but no frank edema or consolidation. Similar appearance to priors. Electronically Signed   By: Staci Righter M.D.   On: 08/17/2017 09:24    EKG: Independently reviewed.  Dermal sinus rhythm with PVC.  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  T wave flattening in the lateral leads.  QTC is 507  Assessment/Plan Active Problems:   Atrial fibrillation [I48.91]   Pulmonary hypertension (HCC)   End-stage renal disease (HCC)   Acute respiratory failure with hypoxia (HCC)   Hypothyroidism   Sepsis (HCC)   Sepsis (hypotension, tachycardia, fever), likely due to pneumonia given his respiratory symptoms but also at risk for bacteremia given his end-stage renal disease and hemodialysis -Follow-up blood cultures -Sputum culture with Gram stain -Pneumo antigen -Flu PCR:  Negative -Continue ceftriaxone and azithromycin -Add vancomycin -MRSA PCR  Paroxysmal atrial fibrillation CHA2DS2 - VASc score of 2, NSR -Check INR -Warfarin per pharmacy Amiodarone  Hyperlipidemia, stable, continue Atorvastatin  End-stage renal disease, receives daily home hemodialysis Monday through Friday Cinacalcet Nephrology consultation: hemodialysis orders written,   Hypothyroidism, stable, continue Levothyroxine  Hypotension, chronic problem, continue midodrine  Pulmonary hypertension, stable Tadalafil and ambrisentan  AS and MS, followed by Cardiology routinely as outpatient  Prolonged QTC, minimize QTC prolonging medications -  check magnesium level   DVT prophylaxis: warfarin  Code Status: full code Family Communication:  Patient and wife Disposition Plan: Likely to home in several days Consults called: Nephrology Admission status: Patient, stepdown at [facility] Pager [phone]  If 7PM-7AM, please contact night-coverage [email or web address] Password [facility]  08/17/2017, 3:17 PM

## 2017-08-17 NOTE — ED Triage Notes (Signed)
Per pt/family, states flu like symptoms for over a week-states fever on and off-fever this am was 102-did not treat at this time-patient performs dialysis at home-due for treatment today

## 2017-08-18 ENCOUNTER — Inpatient Hospital Stay (HOSPITAL_COMMUNITY): Payer: Medicare HMO

## 2017-08-18 ENCOUNTER — Inpatient Hospital Stay (HOSPITAL_COMMUNITY): Payer: Medicare Other

## 2017-08-18 DIAGNOSIS — E890 Postprocedural hypothyroidism: Secondary | ICD-10-CM

## 2017-08-18 DIAGNOSIS — Z91041 Radiographic dye allergy status: Secondary | ICD-10-CM

## 2017-08-18 DIAGNOSIS — B9561 Methicillin susceptible Staphylococcus aureus infection as the cause of diseases classified elsewhere: Secondary | ICD-10-CM

## 2017-08-18 DIAGNOSIS — L814 Other melanin hyperpigmentation: Secondary | ICD-10-CM

## 2017-08-18 DIAGNOSIS — Z89422 Acquired absence of other left toe(s): Secondary | ICD-10-CM

## 2017-08-18 DIAGNOSIS — N186 End stage renal disease: Secondary | ICD-10-CM

## 2017-08-18 DIAGNOSIS — D696 Thrombocytopenia, unspecified: Secondary | ICD-10-CM

## 2017-08-18 DIAGNOSIS — T8612 Kidney transplant failure: Secondary | ICD-10-CM

## 2017-08-18 DIAGNOSIS — T82898A Other specified complication of vascular prosthetic devices, implants and grafts, initial encounter: Secondary | ICD-10-CM

## 2017-08-18 DIAGNOSIS — I739 Peripheral vascular disease, unspecified: Secondary | ICD-10-CM

## 2017-08-18 DIAGNOSIS — Z888 Allergy status to other drugs, medicaments and biological substances status: Secondary | ICD-10-CM

## 2017-08-18 DIAGNOSIS — R7881 Bacteremia: Secondary | ICD-10-CM

## 2017-08-18 DIAGNOSIS — Z95810 Presence of automatic (implantable) cardiac defibrillator: Secondary | ICD-10-CM

## 2017-08-18 DIAGNOSIS — A4101 Sepsis due to Methicillin susceptible Staphylococcus aureus: Secondary | ICD-10-CM

## 2017-08-18 DIAGNOSIS — I509 Heart failure, unspecified: Secondary | ICD-10-CM

## 2017-08-18 DIAGNOSIS — Z992 Dependence on renal dialysis: Secondary | ICD-10-CM

## 2017-08-18 DIAGNOSIS — I2721 Secondary pulmonary arterial hypertension: Secondary | ICD-10-CM

## 2017-08-18 DIAGNOSIS — I361 Nonrheumatic tricuspid (valve) insufficiency: Secondary | ICD-10-CM

## 2017-08-18 DIAGNOSIS — I4891 Unspecified atrial fibrillation: Secondary | ICD-10-CM

## 2017-08-18 DIAGNOSIS — Z89421 Acquired absence of other right toe(s): Secondary | ICD-10-CM

## 2017-08-18 DIAGNOSIS — I350 Nonrheumatic aortic (valve) stenosis: Secondary | ICD-10-CM

## 2017-08-18 DIAGNOSIS — Z841 Family history of disorders of kidney and ureter: Secondary | ICD-10-CM

## 2017-08-18 DIAGNOSIS — Z8249 Family history of ischemic heart disease and other diseases of the circulatory system: Secondary | ICD-10-CM

## 2017-08-18 DIAGNOSIS — I481 Persistent atrial fibrillation: Secondary | ICD-10-CM

## 2017-08-18 DIAGNOSIS — T827XXA Infection and inflammatory reaction due to other cardiac and vascular devices, implants and grafts, initial encounter: Principal | ICD-10-CM

## 2017-08-18 DIAGNOSIS — I132 Hypertensive heart and chronic kidney disease with heart failure and with stage 5 chronic kidney disease, or end stage renal disease: Secondary | ICD-10-CM

## 2017-08-18 DIAGNOSIS — I428 Other cardiomyopathies: Secondary | ICD-10-CM

## 2017-08-18 LAB — BLOOD CULTURE ID PANEL (REFLEXED)
Acinetobacter baumannii: NOT DETECTED
CANDIDA KRUSEI: NOT DETECTED
CANDIDA PARAPSILOSIS: NOT DETECTED
CANDIDA TROPICALIS: NOT DETECTED
Candida albicans: NOT DETECTED
Candida glabrata: NOT DETECTED
ENTEROCOCCUS SPECIES: NOT DETECTED
ESCHERICHIA COLI: NOT DETECTED
Enterobacter cloacae complex: NOT DETECTED
Enterobacteriaceae species: NOT DETECTED
Haemophilus influenzae: NOT DETECTED
KLEBSIELLA PNEUMONIAE: NOT DETECTED
Klebsiella oxytoca: NOT DETECTED
Listeria monocytogenes: NOT DETECTED
Methicillin resistance: NOT DETECTED
Neisseria meningitidis: NOT DETECTED
PROTEUS SPECIES: NOT DETECTED
Pseudomonas aeruginosa: NOT DETECTED
SERRATIA MARCESCENS: NOT DETECTED
STAPHYLOCOCCUS AUREUS BCID: DETECTED — AB
STAPHYLOCOCCUS SPECIES: DETECTED — AB
Streptococcus agalactiae: NOT DETECTED
Streptococcus pneumoniae: NOT DETECTED
Streptococcus pyogenes: NOT DETECTED
Streptococcus species: NOT DETECTED

## 2017-08-18 LAB — CBC
HCT: 30 % — ABNORMAL LOW (ref 39.0–52.0)
HEMOGLOBIN: 9.7 g/dL — AB (ref 13.0–17.0)
MCH: 28.8 pg (ref 26.0–34.0)
MCHC: 32.3 g/dL (ref 30.0–36.0)
MCV: 89 fL (ref 78.0–100.0)
Platelets: 58 10*3/uL — ABNORMAL LOW (ref 150–400)
RBC: 3.37 MIL/uL — AB (ref 4.22–5.81)
RDW: 18.7 % — ABNORMAL HIGH (ref 11.5–15.5)
WBC: 11.5 10*3/uL — ABNORMAL HIGH (ref 4.0–10.5)

## 2017-08-18 LAB — RENAL FUNCTION PANEL
ALBUMIN: 3 g/dL — AB (ref 3.5–5.0)
ANION GAP: 18 — AB (ref 5–15)
BUN: 60 mg/dL — ABNORMAL HIGH (ref 6–20)
CALCIUM: 7.8 mg/dL — AB (ref 8.9–10.3)
CO2: 19 mmol/L — ABNORMAL LOW (ref 22–32)
Chloride: 95 mmol/L — ABNORMAL LOW (ref 101–111)
Creatinine, Ser: 12.74 mg/dL — ABNORMAL HIGH (ref 0.61–1.24)
GFR calc non Af Amer: 4 mL/min — ABNORMAL LOW (ref >60)
GFR, EST AFRICAN AMERICAN: 4 mL/min — AB (ref >60)
Glucose, Bld: 96 mg/dL (ref 65–99)
PHOSPHORUS: 5.7 mg/dL — AB (ref 2.5–4.6)
Potassium: 3.7 mmol/L (ref 3.5–5.1)
Sodium: 132 mmol/L — ABNORMAL LOW (ref 135–145)

## 2017-08-18 LAB — EXPECTORATED SPUTUM ASSESSMENT W GRAM STAIN, RFLX TO RESP C

## 2017-08-18 LAB — PROTIME-INR
INR: 4.58
Prothrombin Time: 43 seconds — ABNORMAL HIGH (ref 11.4–15.2)

## 2017-08-18 LAB — HIV ANTIBODY (ROUTINE TESTING W REFLEX): HIV SCREEN 4TH GENERATION: NONREACTIVE

## 2017-08-18 LAB — ECHOCARDIOGRAM COMPLETE
Height: 73 in
WEIGHTICAEL: 4373.93 [oz_av]

## 2017-08-18 MED ORDER — ALBUMIN HUMAN 25 % IV SOLN
25.0000 g | Freq: Once | INTRAVENOUS | Status: AC
  Administered 2017-08-18: 25 g via INTRAVENOUS

## 2017-08-18 MED ORDER — CEFAZOLIN SODIUM-DEXTROSE 2-4 GM/100ML-% IV SOLN
2.0000 g | Freq: Once | INTRAVENOUS | Status: AC
  Administered 2017-08-18: 2 g via INTRAVENOUS
  Filled 2017-08-18: qty 100

## 2017-08-18 MED ORDER — MIDODRINE HCL 5 MG PO TABS
ORAL_TABLET | ORAL | Status: AC
  Filled 2017-08-18: qty 2

## 2017-08-18 MED ORDER — CEFAZOLIN SODIUM-DEXTROSE 1-4 GM/50ML-% IV SOLN
1.0000 g | INTRAVENOUS | Status: DC
  Administered 2017-08-18 – 2017-08-27 (×8): 1 g via INTRAVENOUS
  Filled 2017-08-18 (×11): qty 50

## 2017-08-18 MED ORDER — AMBRISENTAN 10 MG PO TABS
10.0000 mg | ORAL_TABLET | Freq: Every day | ORAL | Status: DC
  Administered 2017-08-18 – 2017-08-27 (×9): 10 mg via ORAL
  Filled 2017-08-18 (×2): qty 1
  Filled 2017-08-18: qty 2
  Filled 2017-08-18: qty 1
  Filled 2017-08-18 (×2): qty 2
  Filled 2017-08-18: qty 1
  Filled 2017-08-18 (×2): qty 2
  Filled 2017-08-18 (×3): qty 1
  Filled 2017-08-18 (×2): qty 2

## 2017-08-18 MED ORDER — ALBUMIN HUMAN 25 % IV SOLN
INTRAVENOUS | Status: AC
  Administered 2017-08-18: 25 g via INTRAVENOUS
  Filled 2017-08-18: qty 100

## 2017-08-18 MED ORDER — PERFLUTREN LIPID MICROSPHERE
INTRAVENOUS | Status: AC
  Administered 2017-08-18: 2 mL via INTRAVENOUS
  Filled 2017-08-18: qty 10

## 2017-08-18 NOTE — Progress Notes (Signed)
Paged Dr. Wynelle [place] d/t patient c/o new onset left fistula access pain 4/10 and notify of ID recommendation for vascular surgery consult.

## 2017-08-18 NOTE — Progress Notes (Signed)
PROGRESS NOTE    Adam Alm.   [id number]  DOB: [birth year]  DOA: 08/17/2017 PCP: [name], MD   Brief Narrative:  Adam Alm. is a 57 y/o with ESRD on home hemodialysis on Monday through Friday for the past year, atrial fibrillation on Coumadin, chronic hypertension, nonischemic cardiomyopathy with AICD, pulmonary hypertension, moderate aortic stenosis He has been feeling well up until one day prior to admission when he developed a fever (102.8) and dyspnea.  Apparently his wife stated that he had felt warm for a few days and may have been having fevers and not realizing it.  He has been doing his dialysis and his fistula has felt fine.  He had a dry cough for 1 week. He was admitted at [facility] in transfer to [facility] for ongoing dialysis.  Blood cultures became positive for MSSA. He received a fluid bolus yesterday and albumin infusion this morning (by nephrology) for hypotension.  He is also on midodrine at home.  Subjective: He was evaluated this morning while on dialysis.  See above in regards to complaints today.  No nausea vomiting constipation or abdominal pain.   Assessment & Plan:   Principal Problem:   MSSA bacteremia with sepsis (fevers leukocytosis hypotension) -In setting of AICD  -Appreciate management by ID-has been transitioned to Ancef -We will DC azithromycin and hold Cialis (hypotension)  Active Problems:    Atrial fibrillation-Elevated INR - Amiodarone - pharmacy managing coumadin  Acute on chronic thrombocytopenia -It appears that platelets have been between 101- 150 in the past -today have dropped to 58 and this may be secondary to sepsis with gram-negative bacteremia-no signs of bleeding - INR is elevated and not on any other anticoagulation -Placed on bleeding precautions   ESRD/acute on chronic hypotension - on dialysis Monday through Friday and midodrine -Fistula is not infected-  -Being managed by  nephrology  Severe pulmonary hypertension Tadalafil is being replaced with Cialis-we will need to hold this due to hypotension -Resume ambrisentan today  Hyperlipidemia -Continue statin  Hypothyroidism Synthroid  DVT prophylaxis: INR elevated-Coumadin Code Status: Full code Family Communication:  Disposition Plan: Follow-up on MSSA bacteremia and hypotension- Consultants:   ID  Nephrology Procedures:    Antimicrobials:  Anti-infectives (From admission, onward)   Start     Dose/Rate Route Frequency Ordered Stop   08/18/17 1800  ceFAZolin (ANCEF) IVPB 1 g/50 mL premix     1 g 100 mL/hr over 30 Minutes Intravenous Every 24 hours 08/18/17 0035     08/18/17 1000  cefTRIAXone (ROCEPHIN) 1 g in sodium chloride 0.9 % 100 mL IVPB  Status:  Discontinued     1 g 200 mL/hr over 30 Minutes Intravenous Every 24 hours 08/17/17 1151 08/18/17 0035   08/18/17 0100  ceFAZolin (ANCEF) IVPB 2g/100 mL premix     2 g 200 mL/hr over 30 Minutes Intravenous  Once 08/18/17 0035 08/18/17 0127   08/17/17 1600  azithromycin (ZITHROMAX) tablet 500 mg     500 mg Oral Daily 08/17/17 1505 08/20/17 0959   08/17/17 1200  vancomycin (VANCOCIN) 2,000 mg in sodium chloride 0.9 % 500 mL IVPB     2,000 mg 250 mL/hr over 120 Minutes Intravenous  Once 08/17/17 1144 08/17/17 1526   08/17/17 1115  cefTRIAXone (ROCEPHIN) 1 g in sodium chloride 0.9 % 100 mL IVPB     1 g 200 mL/hr over 30 Minutes Intravenous  Once 08/17/17 1111 08/17/17 1313   08/17/17 1115  azithromycin (  ZITHROMAX) tablet 500 mg     500 mg Oral  Once 08/17/17 1111 08/17/17 1139       Objective: Vitals:   08/18/17 0930 08/18/17 0945 08/18/17 1000 08/18/17 1030  BP: (!) 87/55 97/67 106/63 (!) 98/56  Pulse: 81 87 80 93  Resp:      Temp:      TempSrc:      SpO2:      Weight:      Height:        Intake/Output Summary (Last 24 hours) at 08/18/2017 1039 Last data filed at 08/18/2017 0057 Gross per 24 hour  Intake 1200 ml  Output -  Net  1200 ml   Filed Weights   08/17/17 0858 08/18/17 0423 08/18/17 0700  Weight: 122.5 kg (270 lb) 125.6 kg (276 lb 14.4 oz) 125.6 kg (276 lb 14.4 oz)    Examination: General exam: Appears comfortable  HEENT: PERRLA, oral mucosa moist, no sclera icterus or thrush Respiratory system: Clear to auscultation.  Has a dry cough respiratory effort normal. Cardiovascular system: S1 & S2 heard, RRR.   Gastrointestinal system: Abdomen soft, non-tender, nondistended. Normal bowel sound. No organomegaly Central nervous system: Alert and oriented. No focal neurological deficits. Extremities: No cyanosis, clubbing or edema Skin: No rashes or ulcers Psychiatry:  Mood & affect appropriate.     Data Reviewed: I have personally reviewed following labs and imaging studies  CBC: Recent Labs  Lab 08/17/17 0902 08/18/17 0305  WBC 12.0* 11.5*  NEUTROABS 11.1*  --   HGB 10.5* 9.7*  HCT 33.0* 30.0*  MCV 90.7 89.0  PLT 78* 58*   Basic Metabolic Panel: Recent Labs  Lab 08/17/17 0902 08/18/17 0305  NA 133* 132*  K 4.2 3.7  CL 93* 95*  CO2 21* 19*  GLUCOSE 106* 96  BUN 55* 60*  CREATININE 10.98* 12.74*  CALCIUM 8.2* 7.8*  PHOS  --  5.7*   GFR: Estimated Creatinine Clearance: 9.2 mL/min (A) (by C-G formula based on SCr of 12.74 mg/dL (H)). Liver Function Tests: Recent Labs  Lab 08/17/17 0902 08/18/17 0305  AST 24  --   ALT 13*  --   ALKPHOS 90  --   BILITOT 1.1  --   PROT 6.8  --   ALBUMIN 3.4* 3.0*   No results for input(s): LIPASE, AMYLASE in the last 168 hours. No results for input(s): AMMONIA in the last 168 hours. Coagulation Profile: Recent Labs  Lab 08/17/17 1838 08/18/17 0305  INR 3.88 4.58*   Cardiac Enzymes: No results for input(s): CKTOTAL, CKMB, CKMBINDEX, TROPONINI in the last 168 hours. BNP (last 3 results) No results for input(s): PROBNP in the last 8760 hours. HbA1C: No results for input(s): HGBA1C in the last 72 hours. CBG: No results for input(s): GLUCAP  in the last 168 hours. Lipid Profile: No results for input(s): CHOL, HDL, LDLCALC, TRIG, CHOLHDL, LDLDIRECT in the last 72 hours. Thyroid Function Tests: No results for input(s): TSH, T4TOTAL, FREET4, T3FREE, THYROIDAB in the last 72 hours. Anemia Panel: No results for input(s): VITAMINB12, FOLATE, FERRITIN, TIBC, IRON, RETICCTPCT in the last 72 hours. Urine analysis: No results found for: COLORURINE, APPEARANCEUR, LABSPEC, PHURINE, GLUCOSEU, HGBUR, BILIRUBINUR, KETONESUR, PROTEINUR, UROBILINOGEN, NITRITE, LEUKOCYTESUR Sepsis Labs: @LABRCNTIP (procalcitonin:4,lacticidven:4) ) Recent Results (from the past 240 hour(s))  Blood culture (routine x 2)     Status: None (Preliminary result)   Collection Time: 08/17/17  9:22 AM  Result Value Ref Range Status   Specimen Description   Final  BLOOD RIGHT FOREARM Performed at [facility] [address], [place], [place] [address]    Special Requests   Final    BOTTLES DRAWN AEROBIC AND ANAEROBIC Blood Culture adequate volume Performed at [facility] [address], [place], [place] [address]    Culture  Setup Time   Final    GRAM POSITIVE COCCI IN BOTH AEROBIC AND ANAEROBIC BOTTLES Organism ID to follow CRITICAL RESULT CALLED TO, READ BACK BY AND VERIFIED WITH: A MASTERS PHARMD 08/18/17 0009 JDW Performed at [facility], [place] [address], [place], [place] [address]    Culture GRAM POSITIVE COCCI  Final   Report Status PENDING  Incomplete  Blood Culture ID Panel (Reflexed)     Status: Abnormal   Collection Time: 08/17/17  9:22 AM  Result Value Ref Range Status   Enterococcus species NOT DETECTED NOT DETECTED Final   Listeria monocytogenes NOT DETECTED NOT DETECTED Final   Staphylococcus species DETECTED (A) NOT DETECTED Final    Comment: CRITICAL RESULT CALLED TO, READ BACK BY AND VERIFIED WITH: A MASTERS PHARMD 08/18/17 0009 JDW    Staphylococcus aureus DETECTED (A) NOT DETECTED Final     Comment: Methicillin (oxacillin) susceptible Staphylococcus aureus (MSSA). Preferred therapy is anti staphylococcal beta lactam antibiotic (Cefazolin or Nafcillin), unless clinically contraindicated. CRITICAL RESULT CALLED TO, READ BACK BY AND VERIFIED WITH: A MASTERS PHARMD 08/18/17 0009 JDW    Methicillin resistance NOT DETECTED NOT DETECTED Final   Streptococcus species NOT DETECTED NOT DETECTED Final   Streptococcus agalactiae NOT DETECTED NOT DETECTED Final   Streptococcus pneumoniae NOT DETECTED NOT DETECTED Final   Streptococcus pyogenes NOT DETECTED NOT DETECTED Final   Acinetobacter baumannii NOT DETECTED NOT DETECTED Final   Enterobacteriaceae species NOT DETECTED NOT DETECTED Final   Enterobacter cloacae complex NOT DETECTED NOT DETECTED Final   Escherichia coli NOT DETECTED NOT DETECTED Final   Klebsiella oxytoca NOT DETECTED NOT DETECTED Final   Klebsiella pneumoniae NOT DETECTED NOT DETECTED Final   Proteus species NOT DETECTED NOT DETECTED Final   Serratia marcescens NOT DETECTED NOT DETECTED Final   Haemophilus influenzae NOT DETECTED NOT DETECTED Final   Neisseria meningitidis NOT DETECTED NOT DETECTED Final   Pseudomonas aeruginosa NOT DETECTED NOT DETECTED Final   Candida albicans NOT DETECTED NOT DETECTED Final   Candida glabrata NOT DETECTED NOT DETECTED Final   Candida krusei NOT DETECTED NOT DETECTED Final   Candida parapsilosis NOT DETECTED NOT DETECTED Final   Candida tropicalis NOT DETECTED NOT DETECTED Final    Comment: Performed at [facility], [place]. [address], [place], [place] [address]  Blood culture (routine x 2)     Status: None (Preliminary result)   Collection Time: 08/17/17  9:52 AM  Result Value Ref Range Status   Specimen Description   Final    BLOOD RIGHT FOREARM Performed at [place] [address] [place], [place] [address]    Special Requests   Final    BOTTLES DRAWN AEROBIC AND ANAEROBIC Blood Culture adequate  volume Performed at [facility] [address], [place], [place] [address]    Culture  Setup Time   Final    GRAM POSITIVE COCCI IN BOTH AEROBIC AND ANAEROBIC BOTTLES CRITICAL RESULT CALLED TO, READ BACK BY AND VERIFIED WITH: A MASTERS PHARMD 08/18/17 00009 JDW Performed at [facility], [place] [address], [place],  [address]    Culture [facility] POSITIVE COCCI  Final  Report Status PENDING  Incomplete  MRSA PCR Screening     Status: None   Collection Time: 08/17/17  8:40 PM  Result Value Ref Range Status   MRSA by PCR NEGATIVE NEGATIVE Final    Comment:        The GeneXpert MRSA Assay (FDA approved for NASAL specimens only), is one component of a comprehensive MRSA colonization surveillance program. It is not intended to diagnose MRSA infection nor to guide or monitor treatment for MRSA infections. Performed at [facility], [place] [address], [place], [place] [address]   Culture, blood (Routine X 2) w Reflex to ID Panel     Status: None (Preliminary result)   Collection Time: 08/18/17  3:05 AM  Result Value Ref Range Status   Specimen Description BLOOD RIGHT HAND  Final   Special Requests   Final    BOTTLES DRAWN AEROBIC ONLY Blood Culture adequate volume Performed at [facility], [place] [address], [place], [place] [address]    Culture PENDING  Incomplete   Report Status PENDING  Incomplete  Culture, blood (Routine X 2) w Reflex to ID Panel     Status: None (Preliminary result)   Collection Time: 08/18/17  3:05 AM  Result Value Ref Range Status   Specimen Description BLOOD RIGHT HAND  Final   Special Requests   Final    BOTTLES DRAWN AEROBIC ONLY Blood Culture adequate volume Performed at [facility], [place] [address] [place], [place] [address]    Culture PENDING  Incomplete   Report Status PENDING  Incomplete         Radiology Studies: Dg Chest 2 View  Result Date: 08/17/2017 CLINICAL DATA:  Fever and SOB.  Symptoms for  1 week. EXAM: CHEST - 2 VIEW COMPARISON:  11/11/2016. FINDINGS: Cardiomegaly. Dual lead pacer. Mild vascular congestion without overt edema or consolidation. No effusion or pneumothorax. Vascular stent overlies the LEFT axilla. IMPRESSION: Cardiomegaly with mild vascular congestion but no frank edema or consolidation. Similar appearance to priors. Electronically Signed   By: Staci Righter M.D.   On: 08/17/2017 09:24      Scheduled Meds: . ambrisentan  10 mg Oral Daily  . amiodarone  200 mg Oral QPM  . atorvastatin  40 mg Oral QPM  . azithromycin  500 mg Oral Daily  . calcitRIOL  0.25 mcg Oral Daily  . calcium acetate  1,334 mg Oral TID WC  . cinacalcet  90 mg Oral QPM  . levothyroxine  300 mcg Oral QAC breakfast  . midodrine  10 mg Oral 3 times per day  . tadalafil  20 mg Oral Daily  . Warfarin - Pharmacist Dosing Inpatient   Does not apply q1800   Continuous Infusions: .  ceFAZolin (ANCEF) IV       LOS: 1 day    Time spent in minutes: [place], MD [facility] Pager: [email or web address] Password [email or web address] 08/18/2017, 10:39 AM

## 2017-08-18 NOTE — Progress Notes (Signed)
Subjective:  Seen in HD- blood cultures showed looks like MSSA - tmax 100- BP still low but not outside normal for pt per him yesterday   Objective Vital signs in last 24 hours: Vitals:   08/18/17 0717 08/18/17 0725 08/18/17 0745 08/18/17 0800  BP: (!) 73/42 (!) 81/45 (!) 87/54 (!) 88/56  Pulse: 80 79 78 85  Resp: (!) 22 20    Temp:      TempSrc:      SpO2:      Weight:      Height:       Weight change:   Intake/Output Summary (Last 24 hours) at 08/18/2017 6962 Last data filed at 08/18/2017 0057 Gross per 24 hour  Intake 1200 ml  Output -  Net 1200 ml    Assessment/Plan: 57 year old BM with multiple medical problems including ESRD- Presenting with fever and shortness of breath with elevated white count and low blood pressure 1 Fever and elevated white blood count- the presence of low blood pressure although is typical for the patient brings to mind possible sepsis syndrome. Cultures obtained- now blood cultures growing MSSA - given ancef, ceftriaxone, vanc and also azithro patient will be admitted to watch- could certainly have contaminated with home HD but I imagine echo will be done to rule out endocarditis.  Access does not look infected but will get u/s to rule out abscess  2 ESRD: Normally does home hemodialysis 5 days per week, last was Friday. Did not get HD because did not have room yet.  HD today and then likely tomorrow as well since intradialytic period has been so long.  Fistula does not appear to be source of infection 3 Hypertension: Patient is actually normally baseline hypotensive. He takes Midodrine pre-and posttreatment- This practice will be continued here. He does have some lower extremity edema so will ultrafilter gently 4. Anemia of ESRD: Hemoglobin here 10.4, now 9.7. - recently given venofer as OP- mircera 225 given 3/28 5. Metabolic Bone Disease: Is reportedly on sensipar 90 mg daily, phoslo 2-4 TID and calcitriol 0.25 daily      Adam [name]  A    Labs: Basic Metabolic Panel: Recent Labs  Lab 08/17/17 0902 08/18/17 0305  NA 133* 132*  K 4.2 3.7  CL 93* 95*  CO2 21* 19*  GLUCOSE 106* 96  BUN 55* 60*  CREATININE 10.98* 12.74*  CALCIUM 8.2* 7.8*  PHOS  --  5.7*   Liver Function Tests: Recent Labs  Lab 08/17/17 0902 08/18/17 0305  AST 24  --   ALT 13*  --   ALKPHOS 90  --   BILITOT 1.1  --   PROT 6.8  --   ALBUMIN 3.4* 3.0*   No results for input(s): LIPASE, AMYLASE in the last 168 hours. No results for input(s): AMMONIA in the last 168 hours. CBC: Recent Labs  Lab 08/17/17 0902 08/18/17 0305  WBC 12.0* 11.5*  NEUTROABS 11.1*  --   HGB 10.5* 9.7*  HCT 33.0* 30.0*  MCV 90.7 89.0  PLT 78* 58*   Cardiac Enzymes: No results for input(s): CKTOTAL, CKMB, CKMBINDEX, TROPONINI in the last 168 hours. CBG: No results for input(s): GLUCAP in the last 168 hours.  Iron Studies: No results for input(s): IRON, TIBC, TRANSFERRIN, FERRITIN in the last 72 hours. Studies/Results: Dg Chest 2 View  Result Date: 08/17/2017 CLINICAL DATA:  Fever and SOB.  Symptoms for 1 week. EXAM: CHEST - 2 VIEW COMPARISON:  11/11/2016. FINDINGS: Cardiomegaly. Dual lead  pacer. Mild vascular congestion without overt edema or consolidation. No effusion or pneumothorax. Vascular stent overlies the LEFT axilla. IMPRESSION: Cardiomegaly with mild vascular congestion but no frank edema or consolidation. Similar appearance to priors. Electronically Signed   By: Staci Righter M.D.   On: 08/17/2017 09:24   Medications: Infusions: . [START ON 08/19/2017]  ceFAZolin (ANCEF) IV      Scheduled Medications: . ambrisentan  10 mg Oral Daily  . amiodarone  200 mg Oral QPM  . atorvastatin  40 mg Oral QPM  . azithromycin  500 mg Oral Daily  . calcitRIOL  0.25 mcg Oral Daily  . calcium acetate  1,334 mg Oral TID WC  . cinacalcet  90 mg Oral QPM  . levothyroxine  300 mcg Oral QAC breakfast  . midodrine  10 mg Oral 3 times per day  . tadalafil  20 mg  Oral Daily  . Warfarin - Pharmacist Dosing Inpatient   Does not apply q1800    have reviewed scheduled and prn medications.  Physical Exam: General: alert Heart: RRR- no murmer Lungs: mostly clear Abdomen: soft, non tender Extremities: mild edema Dialysis Access: left upper AVF- no signs of infection    08/18/2017,8:12 AM  LOS: 1 day

## 2017-08-18 NOTE — Progress Notes (Signed)
Paged Dr Tommy Medal d/t no cb from Dr. Wynelle Cleveland to see if he can put consults in computer for cardiology and vascular. Dr. Tommy Medal stated he will place consults if primary unable to do so.

## 2017-08-18 NOTE — Progress Notes (Signed)
[facility] Infusion Coordinator will follow pt with ID team to support home IV ABX if needed/ordered.  If patient discharges after hours, please call [phone].   Adam [name] 08/18/2017, 2:50 PM

## 2017-08-18 NOTE — Progress Notes (Addendum)
PHARMACY - PHYSICIAN COMMUNICATION CRITICAL VALUE ALERT - BLOOD CULTURE IDENTIFICATION (BCID)  Adam Maino. is an 57 y.o. male who presented to [place] on 08/17/2017   Assessment: 2/2 blood cultures positive with MSSA Patient is on vancomycin as well as ceftriaxone/azithro as patient presented with s/sx of pneumonia.  Will let day time team decide whether or not to keep ctx/azithro.  Name of physician (or Provider) Contacted: Baltazar Najjar, NP  Current antibiotics: vancomycin, ceftriaxone, azithromycin  Addendum: ID changed abx from vancomycin to cefazolin  Results for orders placed or performed during the hospital encounter of 08/17/17  Blood Culture ID Panel (Reflexed) (Collected: 08/17/2017  9:22 AM)  Result Value Ref Range   Enterococcus species NOT DETECTED NOT DETECTED   Listeria monocytogenes NOT DETECTED NOT DETECTED   Staphylococcus species DETECTED (A) NOT DETECTED   Staphylococcus aureus DETECTED (A) NOT DETECTED   Methicillin resistance NOT DETECTED NOT DETECTED   Streptococcus species NOT DETECTED NOT DETECTED   Streptococcus agalactiae NOT DETECTED NOT DETECTED   Streptococcus pneumoniae NOT DETECTED NOT DETECTED   Streptococcus pyogenes NOT DETECTED NOT DETECTED   Acinetobacter baumannii NOT DETECTED NOT DETECTED   Enterobacteriaceae species NOT DETECTED NOT DETECTED   Enterobacter cloacae complex NOT DETECTED NOT DETECTED   Escherichia coli NOT DETECTED NOT DETECTED   Klebsiella oxytoca NOT DETECTED NOT DETECTED   Klebsiella pneumoniae NOT DETECTED NOT DETECTED   Proteus species NOT DETECTED NOT DETECTED   Serratia marcescens NOT DETECTED NOT DETECTED   Haemophilus influenzae NOT DETECTED NOT DETECTED   Neisseria meningitidis NOT DETECTED NOT DETECTED   Pseudomonas aeruginosa NOT DETECTED NOT DETECTED   Candida albicans NOT DETECTED NOT DETECTED   Candida glabrata NOT DETECTED NOT DETECTED   Candida krusei NOT DETECTED NOT DETECTED   Candida parapsilosis NOT  DETECTED NOT DETECTED   Candida tropicalis NOT DETECTED NOT DETECTED    Adam [name] 08/18/2017  12:20 AM

## 2017-08-18 NOTE — Progress Notes (Signed)
Patient received via care link alert and Adam [name]. Skin warm and dry resp. even unlabored with no complaints.  R.N. In to do assessment. Patient Adam [name]. To room

## 2017-08-18 NOTE — Progress Notes (Signed)
[place] for Warfarin Indication: atrial fibrillation  Allergies  Allergen Reactions  . Enalaprilat Swelling    Mouth swelling  . Vasotec [Enalapril] Swelling    MOUTH SWELLING  . Ivp Dye [Iodinated Diagnostic Agents] Nausea And Vomiting    Patient Measurements: Height: 6\' 1"  (185.4 cm) Weight: 276 lb 14.4 oz (125.6 kg) IBW/kg (Calculated) : 79.9   Vital Signs: Temp: 98.3 F (36.8 C) (04/09 0700) Temp Source: Oral (04/09 0700) BP: 80/36 (04/09 0830) Pulse Rate: 81 (04/09 0830)  Labs: Recent Labs    08/17/17 0902 08/17/17 1838 08/18/17 0305  HGB 10.5*  --  9.7*  HCT 33.0*  --  30.0*  PLT 78*  --  58*  LABPROT  --  37.8* 43.0*  INR  --  3.88 4.58*  CREATININE 10.98*  --  12.74*    Estimated Creatinine Clearance: 9.2 mL/min (A) (by C-G formula based on SCr of 12.74 mg/dL (H)).  Assessment: 70 y/oM with PMH of ESRD, hypothyroidism, pulmonary hypertension, AS, MS, and paroxysmal atrial fibrillation on chronic warfarin at home admitted for sepsis from MSSA bacteremia. Pharmacy consulted to manage warfarin therapy while patient admitted.   INR remains supratherapeutic and increased from yesterday, INR 3.88 >> 4.58. No bleeding noted, Hgb 9.7, platelets are low at 58 (100-120s normally).  PTA dose reported as 9mg  daily except 6mg  on Sundays   Goal of Therapy:  INR 2-3 Monitor platelets by anticoagulation protocol: Yes   Plan:  Hold warfarin Daily INR Monitor for s/sx of bleeding   Renold Genta, PharmD, BCPS Clinical Pharmacist Clinical phone for 08/18/2017 until 4p is [phone] After 4p, please call Main Rx at [phone] for assistance 08/18/2017 8:39 AM

## 2017-08-18 NOTE — Progress Notes (Signed)
Pt BP 69/46, states he feels slightly lightheaded. Gave scheduled midodrine PO early. Temp 101.7 oral, gave tylenol. MD paged. Instructed pt not to get out of bed at this time by himself. Call light in reach. Will continue to monitor.  Clyde Canterbury, RN

## 2017-08-18 NOTE — Consult Note (Addendum)
Hospital Consult    Reason for Consult:  Question of infected AV fistula left arm Requesting Physician:  Dr. Wynelle Cleveland MRN #:  [id number]  History of Present Illness: This is a 57 y.o. male with past medical history significant for CHF with ICD, atrial fibrillation on coumadin, and ESRD on home hemodialysis via L arm AV fistula.  He was admitted and met criteria for sepsis secondary to MSSA.  Currently patient is afebrile with a white blood cell count of 11.8.  He denies subjective fevers, chills, N/V.  Surgical history significant for fistula plication x2 with most recent being 11/2016.  He denies any trouble with hemodialysis treatments via L arm AV fistula.  He denies any drainage or bleeding areas.  Coumadin is currently being held due to supratherapeutic INR as well as need for ICD removal per Heart failure and ID services.    Past Medical History:  Diagnosis Date  . AICD (automatic cardioverter/defibrillator) present    [employer]   . Aortic stenosis    moderate AS by 08/2016 echo  . Arthritis    "knees" (08/05/2016)  . Atrial fibrillation ([place])   . ESRD (end stage renal disease) on dialysis ([place])    "M, T, W, T, F; NX stage hemodialysis; I do it at home" (08/05/2016)  . 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 History of blood transfusion 2009   "S/P biopsy for prostate cancer check"  . Hypothyroidism (acquired)   . Mitral stenosis    moderate mitral stenosis  . Nonischemic cardiomyopathy ([place])   . Pneumonia 2014; 08/05/2016  . PONV (postoperative nausea and vomiting)   . Pulmonary HTN ([place])   . Thyroid cancer ([place]) 2011    Past Surgical History:  Procedure Laterality Date  . CARDIAC CATHETERIZATION Right 2017  . CARDIAC DEFIBRILLATOR PLACEMENT  2009  . DIALYSIS FISTULA CREATION Left 2003   "upper arm"  . HERNIA REPAIR    . INSERTION OF DIALYSIS CATHETER N/A 11/11/2016   Procedure: INSERTION OF TUNNELED DIALYSIS CATHETER;  Surgeon:  Angelia Mould, MD;  Location: [place];  Service: Vascular;  Laterality: N/A;  . KIDNEY TRANSPLANT  2009  . KNEE SURGERY Bilateral 2000s   "drained fluid"  . LAPAROSCOPIC GASTRIC BANDING  2006  . PROSTATE BIOPSY  2009  . REVISON OF ARTERIOVENOUS FISTULA Left 09/12/5463   Procedure: PLICATION OF ARTERIOVENOUS FISTULA ANEURYSM;  Surgeon: Elam Dutch, MD;  Location: [place];  Service: Vascular;  Laterality: Left;  . REVISON OF ARTERIOVENOUS FISTULA Left 11/10/2016   Procedure: REVISON OF ARTERIOVENOUS FISTULA - LEFT UPPER ARM;  Surgeon: Elam Dutch, MD;  Location: [place];  Service: Vascular;  Laterality: Left;  . THROMBECTOMY W/ EMBOLECTOMY Left 12/25/2014   Procedure: THROMBECTOMY ARTERIOVENOUS FISTULA;  Surgeon: Elam Dutch, MD;  Location: [place];  Service: Vascular;  Laterality: Left;  . THYROIDECTOMY  2011  . TOE AMPUTATION Bilateral     right 1st and  2nd digits; left 1st and 3rd digits"  . UMBILICAL HERNIA REPAIR  X 2    Allergies  Allergen Reactions  . Enalaprilat Swelling    Mouth swelling  . Vasotec [Enalapril] Swelling    MOUTH SWELLING  . Ivp Dye [Iodinated Diagnostic Agents] Nausea And Vomiting    Prior to Admission medications   Medication Sig Start Date End Date Taking? Authorizing Provider  ambrisentan (LETAIRIS) 10 MG tablet Take 1 tablet (10 mg total) by mouth daily. 01/16/17  Yes Juanito Doom, MD  amiodarone (PACERONE) 200 MG tablet Take 200 mg by mouth every evening.    Yes [provider]  atorvastatin (LIPITOR) 40 MG tablet Take 1 tablet (40 mg total) by mouth every evening. 06/15/17  Yes Diallo, Abdoulaye, MD  cinacalcet (SENSIPAR) 90 MG tablet Take 90 mg by mouth every evening.    Yes [provider]  levothyroxine (SYNTHROID, LEVOTHROID) 300 MCG tablet Take 1 tablet (300 mcg total) by mouth daily. 06/09/17  Yes Renato Shin, MD  midodrine (PROAMATINE) 10 MG tablet Take 1 tablet (10 mg total) by mouth 3 (three) times daily.  09/02/16  Yes Minus Breeding, MD  tadalafil, PAH, (ADCIRCA) 20 MG tablet 1 tab daily 12/05/16  Yes Juanito Doom, MD  warfarin (COUMADIN) 6 MG tablet Take 6-9 mg by mouth See admin instructions. Take 1.5 tablets (9 mg) by mouth in the evening, except take 1 tablet (6 mg) in the evening on Sunday. 10/18/16  Yes [provider]    Social History   Socioeconomic History  . Marital status: Married    Spouse name: Not on file  . Number of children: 1  . Years of education: Not on file  . Highest education level: Not on file  Occupational History  . Not on file  Social Needs  . Financial resource strain: Not on file  . Food insecurity:    Worry: Not on file    Inability: Not on file  . Transportation needs:    Medical: Not on file    Non-medical: Not on file  Tobacco Use  . Smoking status: Never Smoker  . Smokeless tobacco: Never Used  Substance and Sexual Activity  . Alcohol use: Yes    Alcohol/week: 0.0 oz    Comment: 08/05/2016 "might have 1 shot/month of liquor"  . Drug use: No  . Sexual activity: Yes  Lifestyle  . Physical activity:    Days per week: Not on file    Minutes per session: Not on file  . Stress: Not on file  Relationships  . Social connections:    Talks on phone: Not on file    Gets together: Not on file    Attends religious service: Not on file    Active member of club or organization: Not on file    Attends meetings of clubs or organizations: Not on file    Relationship status: Not on file  . Intimate partner violence:    Fear of current or ex partner: Not on file    Emotionally abused: Not on file    Physically abused: Not on file    Forced sexual activity: Not on file  Other Topics Concern  . Not on file  Social History Narrative   Retired Archivist.       Family History  Problem Relation Age of Onset  . Heart failure Mother   . Hypertension Mother   . CAD Mother 51  . Emphysema Mother   . Hypertension Father     . Kidney failure Father     ROS: Otherwise negative unless mentioned in HPI  Physical Examination  Vitals:   08/18/17 1540 08/18/17 1544  BP: (!) 69/46 (!) 64/39  Pulse: 99 96  Resp: (!) 25 (!) 29  Temp: (!) 101.7 F (38.7 C)   SpO2: 92% 96%   Body mass index is 36.07 kg/m.  General:  WDWN in NAD Gait: Not observed HENT: WNL, normocephalic Pulmonary: normal non-labored breathing, without  Rales, rhonchi,  wheezing Cardiac: Tachycardic Abdomen: soft, NT/ND, no masses Skin: without rashes Vascular Exam/Pulses: faintly palpable L radial; palpable thrill and audible bruit L arm; tenderness to palpation L arm AV fistula s/p dialysis treatment; no areas of fluctuance or obvious infection Extremities: without ischemic changes, without Gangrene , without cellulitis; without open wounds;  Musculoskeletal: no muscle wasting or atrophy; no open wounds B feet  Neurologic: A&O X 3;  No focal weakness or paresthesias are detected; speech is fluent/normal Psychiatric:  The pt has Normal affect. Lymph:  Unremarkable  CBC    Component Value Date/Time   WBC 11.5 (H) 08/18/2017 0305   RBC 3.37 (L) 08/18/2017 0305   HGB 9.7 (L) 08/18/2017 0305   HCT 30.0 (L) 08/18/2017 0305   PLT 58 (L) 08/18/2017 0305   MCV 89.0 08/18/2017 0305   MCH 28.8 08/18/2017 0305   MCHC 32.3 08/18/2017 0305   RDW 18.7 (H) 08/18/2017 0305   LYMPHSABS 0.5 (L) 08/17/2017 0902   MONOABS 0.4 08/17/2017 0902   EOSABS 0.0 08/17/2017 0902   BASOSABS 0.0 08/17/2017 0902    BMET    Component Value Date/Time   NA 132 (L) 08/18/2017 0305   K 3.7 08/18/2017 0305   CL 95 (L) 08/18/2017 0305   CO2 19 (L) 08/18/2017 0305   GLUCOSE 96 08/18/2017 0305   BUN 60 (H) 08/18/2017 0305   CREATININE 12.74 (H) 08/18/2017 0305   CREATININE 4.91 (H) 08/13/2015 1223   CALCIUM 7.8 (L) 08/18/2017 0305   GFRNONAA 4 (L) 08/18/2017 0305   GFRNONAA 13 (L) 07/27/2014 1539   GFRAA 4 (L) 08/18/2017 0305   GFRAA 15 (L) 07/27/2014  1539    COAGS: Lab Results  Component Value Date   INR 4.58 (HH) 08/18/2017   INR 3.88 08/17/2017   INR 2.1 07/29/2017    Statin:  Yes.   Beta Blocker:  No. Aspirin:  No. ACEI:  No. ARB:  No. CCB use:  No Other antiplatelets/anticoagulants:  Yes.   coumadin for atrial fibrillation   ASSESSMENT/PLAN: This is a 56 y.o. male with MSSA positive blood cultures  ID recommending ICD removal as well as 6 weeks of IV antibiotics Echo pending No obvious sign symptom of infection of L arm AV fistula on exam; check ultrasound of L arm; Will also check XR L shoulder and humerus due to prior stenting Ok to continue HD via fistula This case will be discussed with Dr. Trula Slade who will be evaluating the patient later today  Dagoberto Ligas PA-C [facility] [phone]  I agree with the above.  I have seen and evaluated the patient.  We have been consulted for possible infection within his AV fistula, given that he was admitted for sepsis.  He began having an area of swelling between his 2 dialysis cannulation sites as well as pain.  There is no overt infection of his fistula currently.  We have ordered x-rays of his arm and chest.  These show several stents within the fistula.  This would complicate removal of his fistula as he would likely need reconstruction of his brachial artery as 1 of the stents appears to go down to the arterial venous anastomosis.  A duplex of his fistula has been ordered to rule out any peri-fistula fluid.  He can continue to dialyze through this fistula.  We will continue to monitor this.  Annamarie Major

## 2017-08-18 NOTE — Progress Notes (Signed)
    [facility] has been requested to perform a transesophageal echocardiogram on Bennye Alm. for bacteremia.  After careful review of history and examination, the risks and benefits of transesophageal echocardiogram have been explained including risks of esophageal damage, perforation (1:10,000 risk), bleeding, pharyngeal hematoma as well as other potential complications associated with conscious sedation including aspiration, arrhythmia, respiratory failure and death. Alternatives to treatment were discussed, questions were answered. Patient is willing to proceed.   Lyda Jester, PA-C  08/18/2017 3:30 PM

## 2017-08-18 NOTE — Progress Notes (Signed)
   INFECTIOUS DISEASE ATTENDING ADDENDUM:       [facility] Antimicrobial Management Team Staphylococcus aureus bacteremia   Staphylococcus aureus bacteremia (SAB) is associated with a high rate of complications and mortality.  Specific aspects of clinical management are critical to optimizing the outcome of patients with SAB.  Therefore, the [facility] Health Antimicrobial Management Team [employer]) has initiated an intervention aimed at improving the management of SAB at [facility].  To do so, Infectious Diseases physicians are providing an evidence-based consult for the management of all patients with SAB.     Yes No Comments  Perform follow-up blood cultures (even if the patient is afebrile) to ensure clearance of bacteremia [x]  []    Remove vascular catheter and obtain follow-up blood cultures after the removal of the catheter [x]  []  I cannot see that pt has an HD catheter but perhaps his FISTULA needs to be investigated  Perform echocardiography to evaluate for endocarditis (transthoracic ECHO is 40-50% sensitive, TEE is > 90% sensitive) [x]  []  Please keep in mind, that neither test can definitively EXCLUDE endocarditis, and that should clinical suspicion remain high for endocarditis the patient should then still be treated with an "endocarditis" duration of therapy = 6 weeks  He needs TEE  Consult electrophysiologist to evaluate implanted cardiac device (pacemaker, ICD) []  []  Does he have an ICD or pacemaker?  Ensure source control []  []  Have all abscesses been drained effectively? Have deep seeded infections (septic joints or osteomyelitis) had appropriate surgical debridement?  Seems at face value to be related to dialysis    Investigate for "metastatic" sites of infection [x]  []  Does the patient have ANY symptom or physical exam finding that would suggest a deeper infection (back or neck pain that may be suggestive of vertebral osteomyelitis or epidural abscess, muscle pain that could be a  symptom of pyomyositis)?  Keep in mind that for deep seeded infections MRI imaging with contrast is preferred rather than other often insensitive tests such as plain x-rays, especially early in a patient's presentation.  Change antibiotic therapy to cefazolin. If endocarditis with CNS involvement suspected can change to NAFCILLIN. No need for vancomycin []  []  Beta-lactam antibiotics are preferred for MSSA due to higher cure rates.   If on Vancomycin, goal trough should be 15 - 20 mcg/mL  Estimated duration of IV antibiotic therapy:  4-6 weeks [x]  []  Consult case management for probably prolonged outpatient IV antibiotic therapy   We will formally consult in the morning.

## 2017-08-18 NOTE — H&P (View-Only) (Signed)
Hospital Consult    Reason for Consult:  Question of infected AV fistula left arm Requesting Physician:  Dr. Wynelle Cleveland MRN #:  [id number]  History of Present Illness: This is a 57 y.o. male with past medical history significant for CHF with ICD, atrial fibrillation on coumadin, and ESRD on home hemodialysis via L arm AV fistula.  He was admitted and met criteria for sepsis secondary to MSSA.  Currently patient is afebrile with a white blood cell count of 11.8.  He denies subjective fevers, chills, N/V.  Surgical history significant for fistula plication x2 with most recent being 11/2016.  He denies any trouble with hemodialysis treatments via L arm AV fistula.  He denies any drainage or bleeding areas.  Coumadin is currently being held due to supratherapeutic INR as well as need for ICD removal per Heart failure and ID services.    Past Medical History:  Diagnosis Date  . AICD (automatic cardioverter/defibrillator) present    Medtronic   . Aortic stenosis    moderate AS by 08/2016 echo  . Arthritis    "knees" (08/05/2016)  . Atrial fibrillation ([place])   . ESRD (end stage renal disease) on dialysis ([place])    "M, T, W, T, F; NX stage hemodialysis; I do it at home" (08/05/2016)  . 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 History of blood transfusion 2009   "S/P biopsy for prostate cancer check"  . Hypothyroidism (acquired)   . Mitral stenosis    moderate mitral stenosis  . Nonischemic cardiomyopathy ([place])   . Pneumonia 2014; 08/05/2016  . PONV (postoperative nausea and vomiting)   . Pulmonary HTN ([place])   . Thyroid cancer ([place]) 2011    Past Surgical History:  Procedure Laterality Date  . CARDIAC CATHETERIZATION Right 2017  . CARDIAC DEFIBRILLATOR PLACEMENT  2009  . DIALYSIS FISTULA CREATION Left 2003   "upper arm"  . HERNIA REPAIR    . INSERTION OF DIALYSIS CATHETER N/A 11/11/2016   Procedure: INSERTION OF TUNNELED DIALYSIS CATHETER;  Surgeon:  Angelia Mould, MD;  Location: [place];  Service: Vascular;  Laterality: N/A;  . KIDNEY TRANSPLANT  2009  . KNEE SURGERY Bilateral 2000s   "drained fluid"  . LAPAROSCOPIC GASTRIC BANDING  2006  . PROSTATE BIOPSY  2009  . REVISON OF ARTERIOVENOUS FISTULA Left 09/12/5463   Procedure: PLICATION OF ARTERIOVENOUS FISTULA ANEURYSM;  Surgeon: Elam Dutch, MD;  Location: [place];  Service: Vascular;  Laterality: Left;  . REVISON OF ARTERIOVENOUS FISTULA Left 11/10/2016   Procedure: REVISON OF ARTERIOVENOUS FISTULA - LEFT UPPER ARM;  Surgeon: Elam Dutch, MD;  Location: [place];  Service: Vascular;  Laterality: Left;  . THROMBECTOMY W/ EMBOLECTOMY Left 12/25/2014   Procedure: THROMBECTOMY ARTERIOVENOUS FISTULA;  Surgeon: Elam Dutch, MD;  Location: [place];  Service: Vascular;  Laterality: Left;  . THYROIDECTOMY  2011  . TOE AMPUTATION Bilateral     right 1st and  2nd digits; left 1st and 3rd digits"  . UMBILICAL HERNIA REPAIR  X 2    Allergies  Allergen Reactions  . Enalaprilat Swelling    Mouth swelling  . Vasotec [Enalapril] Swelling    MOUTH SWELLING  . Ivp Dye [Iodinated Diagnostic Agents] Nausea And Vomiting    Prior to Admission medications   Medication Sig Start Date End Date Taking? Authorizing Provider  ambrisentan (LETAIRIS) 10 MG tablet Take 1 tablet (10 mg total) by mouth daily. 01/16/17  Yes Juanito Doom, MD  amiodarone (PACERONE) 200 MG tablet Take 200 mg by mouth every evening.    Yes [provider]  atorvastatin (LIPITOR) 40 MG tablet Take 1 tablet (40 mg total) by mouth every evening. 06/15/17  Yes Diallo, Abdoulaye, MD  cinacalcet (SENSIPAR) 90 MG tablet Take 90 mg by mouth every evening.    Yes [provider]  levothyroxine (SYNTHROID, LEVOTHROID) 300 MCG tablet Take 1 tablet (300 mcg total) by mouth daily. 06/09/17  Yes Renato Shin, MD  midodrine (PROAMATINE) 10 MG tablet Take 1 tablet (10 mg total) by mouth 3 (three) times daily.  09/02/16  Yes Minus Breeding, MD  tadalafil, PAH, (ADCIRCA) 20 MG tablet 1 tab daily 12/05/16  Yes Juanito Doom, MD  warfarin (COUMADIN) 6 MG tablet Take 6-9 mg by mouth See admin instructions. Take 1.5 tablets (9 mg) by mouth in the evening, except take 1 tablet (6 mg) in the evening on Sunday. 10/18/16  Yes [provider]    Social History   Socioeconomic History  . Marital status: Married    Spouse name: Not on file  . Number of children: 1  . Years of education: Not on file  . Highest education level: Not on file  Occupational History  . Not on file  Social Needs  . Financial resource strain: Not on file  . Food insecurity:    Worry: Not on file    Inability: Not on file  . Transportation needs:    Medical: Not on file    Non-medical: Not on file  Tobacco Use  . Smoking status: Never Smoker  . Smokeless tobacco: Never Used  Substance and Sexual Activity  . Alcohol use: Yes    Alcohol/week: 0.0 oz    Comment: 08/05/2016 "might have 1 shot/month of liquor"  . Drug use: No  . Sexual activity: Yes  Lifestyle  . Physical activity:    Days per week: Not on file    Minutes per session: Not on file  . Stress: Not on file  Relationships  . Social connections:    Talks on phone: Not on file    Gets together: Not on file    Attends religious service: Not on file    Active member of club or organization: Not on file    Attends meetings of clubs or organizations: Not on file    Relationship status: Not on file  . Intimate partner violence:    Fear of current or ex partner: Not on file    Emotionally abused: Not on file    Physically abused: Not on file    Forced sexual activity: Not on file  Other Topics Concern  . Not on file  Social History Narrative   Retired Archivist.       Family History  Problem Relation Age of Onset  . Heart failure Mother   . Hypertension Mother   . CAD Mother 71  . Emphysema Mother   . Hypertension Father     . Kidney failure Father     ROS: Otherwise negative unless mentioned in HPI  Physical Examination  Vitals:   08/18/17 1540 08/18/17 1544  BP: (!) 69/46 (!) 64/39  Pulse: 99 96  Resp: (!) 25 (!) 29  Temp: (!) 101.7 F (38.7 C)   SpO2: 92% 96%   Body mass index is 36.07 kg/m.  General:  WDWN in NAD Gait: Not observed HENT: WNL, normocephalic Pulmonary: normal non-labored breathing, without  Rales, rhonchi,  wheezing Cardiac: Tachycardic Abdomen: soft, NT/ND, no masses Skin: without rashes Vascular Exam/Pulses: faintly palpable L radial; palpable thrill and audible bruit L arm; tenderness to palpation L arm AV fistula s/p dialysis treatment; no areas of fluctuance or obvious infection Extremities: without ischemic changes, without Gangrene , without cellulitis; without open wounds;  Musculoskeletal: no muscle wasting or atrophy; no open wounds B feet  Neurologic: A&O X 3;  No focal weakness or paresthesias are detected; speech is fluent/normal Psychiatric:  The pt has Normal affect. Lymph:  Unremarkable  CBC    Component Value Date/Time   WBC 11.5 (H) 08/18/2017 0305   RBC 3.37 (L) 08/18/2017 0305   HGB 9.7 (L) 08/18/2017 0305   HCT 30.0 (L) 08/18/2017 0305   PLT 58 (L) 08/18/2017 0305   MCV 89.0 08/18/2017 0305   MCH 28.8 08/18/2017 0305   MCHC 32.3 08/18/2017 0305   RDW 18.7 (H) 08/18/2017 0305   LYMPHSABS 0.5 (L) 08/17/2017 0902   MONOABS 0.4 08/17/2017 0902   EOSABS 0.0 08/17/2017 0902   BASOSABS 0.0 08/17/2017 0902    BMET    Component Value Date/Time   NA 132 (L) 08/18/2017 0305   K 3.7 08/18/2017 0305   CL 95 (L) 08/18/2017 0305   CO2 19 (L) 08/18/2017 0305   GLUCOSE 96 08/18/2017 0305   BUN 60 (H) 08/18/2017 0305   CREATININE 12.74 (H) 08/18/2017 0305   CREATININE 4.91 (H) 08/13/2015 1223   CALCIUM 7.8 (L) 08/18/2017 0305   GFRNONAA 4 (L) 08/18/2017 0305   GFRNONAA 13 (L) 07/27/2014 1539   GFRAA 4 (L) 08/18/2017 0305   GFRAA 15 (L) 07/27/2014  1539    COAGS: Lab Results  Component Value Date   INR 4.58 (HH) 08/18/2017   INR 3.88 08/17/2017   INR 2.1 07/29/2017    Statin:  Yes.   Beta Blocker:  No. Aspirin:  No. ACEI:  No. ARB:  No. CCB use:  No Other antiplatelets/anticoagulants:  Yes.   coumadin for atrial fibrillation   ASSESSMENT/PLAN: This is a 56 y.o. male with MSSA positive blood cultures  ID recommending ICD removal as well as 6 weeks of IV antibiotics Echo pending No obvious sign symptom of infection of L arm AV fistula on exam; check ultrasound of L arm; Will also check XR L shoulder and humerus due to prior stenting Ok to continue HD via fistula This case will be discussed with Dr. Trula Slade who will be evaluating the patient later today  Dagoberto Ligas PA-C [facility] [phone]  I agree with the above.  I have seen and evaluated the patient.  We have been consulted for possible infection within his AV fistula, given that he was admitted for sepsis.  He began having an area of swelling between his 2 dialysis cannulation sites as well as pain.  There is no overt infection of his fistula currently.  We have ordered x-rays of his arm and chest.  These show several stents within the fistula.  This would complicate removal of his fistula as he would likely need reconstruction of his brachial artery as 1 of the stents appears to go down to the arterial venous anastomosis.  A duplex of his fistula has been ordered to rule out any peri-fistula fluid.  He can continue to dialyze through this fistula.  We will continue to monitor this.  Annamarie Major

## 2017-08-18 NOTE — Consult Note (Addendum)
Cardiology Consultation:   Patient ID: Corwin Kuiken.; [id number]; 1960-07-29   Admit date: 08/17/2017 Date of Consult: 08/18/2017  Primary Care Provider: [name], MD Primary Cardiologist: Dr. Percival Spanish PAD: Dr. Gwenlyn Found Device:  Dr. Sallyanne Kuster   Patient Profile:   Elbert Spickler. is a 57 y.o. male with a hx of NICM, ESRF on HD (hx of failed renal transplant), PAD w/hx of toe amputation, PAFib, P.HTN, chronic hypotension on midodrine, VHD w/mod MS followed by Dr. Percival Spanish, who is being seen today for the evaluation of bacteremia w/ICD in place at the request of Dr. Tommy Medal.  History of Present Illness:   Mr. Dax was admitted yesterday with c/o fever/SOB, with reports of 2 days of fever at home 102, [facility] were drawn and started empirically for CAP on ceftriaxone and azithromycin upon admission.  BC came positive with Methicillin sensitive Staphylococcus aureus bacteremia, ID has asked EP to see for device extraction.  LABS K+ 3.7 BUN/Creat 60/12.74  WBC 11.5 H/H 9.7/30.3 Plts 58 INR 4.59 (no warfarin here so far)  TEE scheduled for tomorrow  Fever, last 100.0 this AM BP stable   Device information Dr. Sallyanne Kuster noted in his last note (2017): His defibrillator is a Medtronic Evera XT dual-chamber device (generator change February 2016, initial device and leads implanted in 2009:  atrial lead [employer] [id number] serial number [id number]  defibrillator lead Medtronic [phone] [id number] Sprint Quattro secure serial number [id number]).  He reports 2 previous defibrillator shocks that each occurred roughly 4 years ago on separate occasions. He did not have syncope with either event. He believes that the defibrillator discharges were "appropriate". One happened while he was riding in an elevator, the other happened while he was walking     Past Medical History:  Diagnosis Date  . AICD (automatic cardioverter/defibrillator) present    Medtronic   . Aortic stenosis    moderate AS by 08/2016  echo  . Arthritis    "knees" (08/05/2016)  . Atrial fibrillation ([place])   . ESRD (end stage renal disease) on dialysis ([place] [address])    "M, T, W, T, F; NX stage hemodialysis; I do it at home" (08/05/2016)  . Great toe amputation status ([place])    status post left hallux amputation 08/01/14  . History of blood transfusion 2009   "S/P biopsy for prostate cancer check"  . Hypothyroidism (acquired)   . Mitral stenosis    moderate mitral stenosis  . Nonischemic cardiomyopathy ([place])   . Pneumonia 2014; 08/05/2016  . PONV (postoperative nausea and vomiting)   . Pulmonary HTN ([place])   . Thyroid cancer ([place]) 2011    Past Surgical History:  Procedure Laterality Date  . CARDIAC CATHETERIZATION Right 2017  . CARDIAC DEFIBRILLATOR PLACEMENT  2009  . DIALYSIS FISTULA CREATION Left 2003   "upper arm"  . HERNIA REPAIR    . INSERTION OF DIALYSIS CATHETER N/A 11/11/2016   Procedure: INSERTION OF TUNNELED DIALYSIS CATHETER;  Surgeon: Angelia Mould, MD;  Location: [place];  Service: Vascular;  Laterality: N/A;  . KIDNEY TRANSPLANT  2009  . KNEE SURGERY Bilateral 2000s   "drained fluid"  . LAPAROSCOPIC GASTRIC BANDING  2006  . PROSTATE BIOPSY  2009  . REVISON OF ARTERIOVENOUS FISTULA Left 0/53/9767   Procedure: PLICATION OF ARTERIOVENOUS FISTULA ANEURYSM;  Surgeon: Elam Dutch, MD;  Location: [place];  Service: Vascular;  Laterality: Left;  . REVISON OF ARTERIOVENOUS FISTULA Left 11/10/2016   Procedure: REVISON OF ARTERIOVENOUS FISTULA - LEFT UPPER  ARM;  Surgeon: Elam Dutch, MD;  Location: [place];  Service: Vascular;  Laterality: Left;  . THROMBECTOMY W/ EMBOLECTOMY Left 12/25/2014   Procedure: THROMBECTOMY ARTERIOVENOUS FISTULA;  Surgeon: Elam Dutch, MD;  Location: [place];  Service: Vascular;  Laterality: Left;  . THYROIDECTOMY  2011  . TOE AMPUTATION Bilateral     right 1st and  2nd digits; left 1st and 3rd digits"  . UMBILICAL HERNIA REPAIR  X 2      Inpatient Medications: Scheduled  Meds: . ambrisentan  10 mg Oral Daily  . ambrisentan  10 mg Oral Daily  . amiodarone  200 mg Oral QPM  . atorvastatin  40 mg Oral QPM  . calcitRIOL  0.25 mcg Oral Daily  . calcium acetate  1,334 mg Oral TID WC  . cinacalcet  90 mg Oral QPM  . levothyroxine  300 mcg Oral QAC breakfast  . midodrine  10 mg Oral 3 times per day  . Warfarin - Pharmacist Dosing Inpatient   Does not apply q1800   Continuous Infusions: .  ceFAZolin (ANCEF) IV     PRN Meds: acetaminophen **OR** acetaminophen, oxyCODONE, promethazine  Allergies:    Allergies  Allergen Reactions  . Enalaprilat Swelling    Mouth swelling  . Vasotec [Enalapril] Swelling    MOUTH SWELLING  . Ivp Dye [Iodinated Diagnostic Agents] Nausea And Vomiting    Social History:   Social History   Socioeconomic History  . Marital status: Married    Spouse name: Not on file  . Number of children: 1  . Years of education: Not on file  . Highest education level: Not on file  Occupational History  . Not on file  Social Needs  . Financial resource strain: Not on file  . Food insecurity:    Worry: Not on file    Inability: Not on file  . Transportation needs:    Medical: Not on file    Non-medical: Not on file  Tobacco Use  . Smoking status: Never Smoker  . Smokeless tobacco: Never Used  Substance and Sexual Activity  . Alcohol use: Yes    Alcohol/week: 0.0 oz    Comment: 08/05/2016 "might have 1 shot/month of liquor"  . Drug use: No  . Sexual activity: Yes  Lifestyle  . Physical activity:    Days per week: Not on file    Minutes per session: Not on file  . Stress: Not on file  Relationships  . Social connections:    Talks on phone: Not on file    Gets together: Not on file    Attends religious service: Not on file    Active member of club or organization: Not on file    Attends meetings of clubs or organizations: Not on file    Relationship status: Not on file  . Intimate partner violence:    Fear of current or  ex partner: Not on file    Emotionally abused: Not on file    Physically abused: Not on file    Forced sexual activity: Not on file  Other Topics Concern  . Not on file  Social History Narrative   Retired Archivist.      Family History:    Family History  Problem Relation Age of Onset  . Heart failure Mother   . Hypertension Mother   . CAD Mother 67  . Emphysema Mother   . Hypertension Father   . Kidney failure Father  ROS:  Please see the history of present illness.  All other ROS reviewed and negative.     Physical Exam/Data:   Vitals:   08/18/17 0945 08/18/17 1000 08/18/17 1030 08/18/17 1100  BP: 97/67 106/63 (!) 98/56 102/62  Pulse: 87 80 93 93  Resp:      Temp:      TempSrc:      SpO2:      Weight:      Height:        Intake/Output Summary (Last 24 hours) at 08/18/2017 1126 Last data filed at 08/18/2017 0057 Gross per 24 hour  Intake 1200 ml  Output -  Net 1200 ml   Filed Weights   08/17/17 0858 08/18/17 0423 08/18/17 0700  Weight: 270 lb (122.5 kg) 276 lb 14.4 oz (125.6 kg) 276 lb 14.4 oz (125.6 kg)   Body mass index is 36.53 kg/m.  General:  Well nourished, well developed, in no acute distress HEENT: normal Lymph: no adenopathy Neck: no JVD Endocrine:  No thryomegaly Vascular: No carotid bruits Cardiac:  normal S1, S2; RRR; to-fro murmur, (likely LUE fistula), no gallops or rubs Lungs:  CTA b/l, no wheezing, rhonchi or rales  Abd: soft, nontender, obese, + hernia Ext: no edema Musculoskeletal:  No deformities Skin: warm and dry  Neuro:  No gross focal abnormalities noted Psych:  Normal affect   EKG:  The EKG was personally reviewed and demonstrates:   SR, 90bpm, VC Telemetry:  Telemetry was personally reviewed and demonstrates:   SR/ST 90's-100 range, occ PVCs  Relevant CV Studies:  TEE scheduled for tomorrow  09/02/16: TTE  Study Conclusions - Left ventricle: The cavity size was normal. Wall thickness was    increased in a pattern of mild LVH. Systolic function was normal.   The estimated ejection fraction was in the range of 55% to 60%.   Wall motion was normal; there were no regional wall motion   abnormalities. - Aortic valve: Moderately to severely calcified annulus.   Moderately thickened, moderately calcified leaflets. There was   moderate stenosis. - Mitral valve: The findings are consistent with moderate stenosis. - Left atrium: The atrium was mildly dilated. - Right ventricle: The cavity size was mildly dilated. Systolic   function was mildly reduced. - Right atrium: The atrium was severely dilated. - Tricuspid valve: There was moderate-severe regurgitation. - Pulmonary arteries: Systolic pressure was moderately to severely   increased. PA peak pressure: 65 mm Hg (S).  Laboratory Data:  Chemistry Recent Labs  Lab 08/17/17 0902 08/18/17 0305  NA 133* 132*  K 4.2 3.7  CL 93* 95*  CO2 21* 19*  GLUCOSE 106* 96  BUN 55* 60*  CREATININE 10.98* 12.74*  CALCIUM 8.2* 7.8*  GFRNONAA 5* 4*  GFRAA 5* 4*  ANIONGAP 19* 18*    Recent Labs  Lab 08/17/17 0902 08/18/17 0305  PROT 6.8  --   ALBUMIN 3.4* 3.0*  AST 24  --   ALT 13*  --   ALKPHOS 90  --   BILITOT 1.1  --    Hematology Recent Labs  Lab 08/17/17 0902 08/18/17 0305  WBC 12.0* 11.5*  RBC 3.64* 3.37*  HGB 10.5* 9.7*  HCT 33.0* 30.0*  MCV 90.7 89.0  MCH 28.8 28.8  MCHC 31.8 32.3  RDW 18.7* 18.7*  PLT 78* 58*   Cardiac EnzymesNo results for input(s): TROPONINI in the last 168 hours. No results for input(s): TROPIPOC in the last 168 hours.  BNPNo results for  input(s): BNP, PROBNP in the last 168 hours.  DDimer No results for input(s): DDIMER in the last 168 hours.  Radiology/Studies:  Dg Chest 2 View Result Date: 08/17/2017 CLINICAL DATA:  Fever and SOB.  Symptoms for 1 week. EXAM: CHEST - 2 VIEW COMPARISON:  11/11/2016. FINDINGS: Cardiomegaly. Dual lead pacer. Mild vascular congestion without overt edema or  consolidation. No effusion or pneumothorax. Vascular stent overlies the LEFT axilla. IMPRESSION: Cardiomegaly with mild vascular congestion but no frank edema or consolidation. Similar appearance to priors. Electronically Signed   By: Staci Righter M.D.   On: 08/17/2017 09:24    Assessment and Plan:   1. MSSA bacteremia     ID on case, noted recommends extraction of device regardless if clear vegetation or not by TEE  2. Hx of NICM     ICD     Last several echos have noted improved LVEF  There is hx of shocks by Dr. Loletha Grayer note, though by his noted dated 01/10/16 was 4 years prior "He reports 2 previous defibrillator shocks that each occurred roughly 4 years ago on separate occasions. He did not have syncope with either event. He believes that the defibrillator discharges were "appropriate". One happened while he was riding in an elevator, the other happened while he was walking at a slow pace" these would pre-date his current device.  Will get full device interrogation to evaluate any pacing burden, and help plan procedure going forward.  Last check was VP <1% With improved LVEF, ? If needs re-implant eventually with life vest in the meantime, has apparently has had shocks historically as well.    Likely extraction timing would be Thursday INR 4.58 today and plts 58, will need to watch closely, pending tomorrow's numbers if reversal will be needed   For questions or updates, please contact [facility] Please consult [email or web address] for contact info under Cardiology/STEMI.   Signed, Baldwin Jamaica, PA-C  08/18/2017 11:26 AM  I have seen, examined the patient, and reviewed the above assessment and plan.  Changes to above are made where necessary.  On exam, chronically ill, NAD.  Pt with bacteremia and concerns for ICD system infection.  TEE is planned for tomorrow.  Dr Lovena Le to see in am to consider ICD system extraction pending results of TEE.     Co Sign: Thompson Grayer, MD 08/18/2017 7:22  PM

## 2017-08-18 NOTE — Procedures (Signed)
Patient was seen on dialysis and the procedure was supervised.  BFR 400  Via AVF BP is  80/36.   Patient appears to be tolerating treatment well- pt runs low Bps due to cardiomyopathy   Abbygayle Helfand A 08/18/2017

## 2017-08-18 NOTE — Progress Notes (Signed)
  Echocardiogram 2D Echocardiogram has been performed.  Adam [name] 08/18/2017, 3:07 PM

## 2017-08-18 NOTE — Progress Notes (Signed)
Text page Forrest Moron N.P. P.T. 4.58.

## 2017-08-18 NOTE — Progress Notes (Signed)
Paged and spoke with Dr Wynelle Cleveland. Notified that patient has new complaint of pain in access and that vascular surgery group has not been consulted. Message accepted.

## 2017-08-18 NOTE — Progress Notes (Addendum)
Text page Adam [name] N.P. About Mr. Fahrner low B.P. Skin warm and dry resp even unable. Patient states' "I feel fine." She is aware and will monitor and watch patient for now. R.N. Aware.

## 2017-08-18 NOTE — Consult Note (Signed)
Date of Admission:  08/17/2017          Reason for Consult: Methicillin sensitive Staphylococcus aureus bacteremia with sepsis and infected defibrillator    Referring Provider: Vital consult via [facility] and Dr. Andy Gauss   Assessment: 1.  Methicillin sensitive Staphylococcus aureus bacteremia with sepsis 2.  ICD in place which is by definition infected and will need to be removed 3.  End-stage renal disease on hemodialysis at home via a fistula in the left arm which needs to be evaluated by vascular surgery 4.  History of prior amputation of toes bilaterally due to peripheral vascular disease and infection  Plan: 1. Continue cefazolin 2. Repeat blood cultures to ensure clearance 3. Obtain transesophageal echocardiogram and I have already called the "[facility]" 4. Formal EP consult and he will need ICD extracted--I have also called and discussed with Crissie Sickles, MD re this 5. PLEASE have VVS assess his AVF with graft for infection 6. He will need 6 weeks of IV antibiotics after removal of his ICD and insurance that the fistula is either not infected or if infected has been addressed surgically by vascular surgery. 7. After the above has been accomplished then reimplantation of ICD can be considered. 8. I will retest him for HIV  Active Problems:   Atrial fibrillation [I48.91]   Pulmonary hypertension (HCC)   End-stage renal disease ([place])   Acute respiratory failure with hypoxia (HCC)   Hypothyroidism   Sepsis ([place])   Scheduled Meds: . ambrisentan  10 mg Oral Daily  . amiodarone  200 mg Oral QPM  . atorvastatin  40 mg Oral QPM  . azithromycin  500 mg Oral Daily  . calcitRIOL  0.25 mcg Oral Daily  . calcium acetate  1,334 mg Oral TID WC  . cinacalcet  90 mg Oral QPM  . levothyroxine  300 mcg Oral QAC breakfast  . midodrine  10 mg Oral 3 times per day  . tadalafil  20 mg Oral Daily  . Warfarin - Pharmacist Dosing Inpatient   Does not apply q1800   Continuous  Infusions: .  ceFAZolin (ANCEF) IV     PRN Meds:.acetaminophen **OR** acetaminophen, oxyCODONE, promethazine  HPI: Adam [name]. is a 57 y.o. male with history of hypertensive nephropathy and end-stage renal disease, status post prior renal transplantation which was complicated by atrial fibrillation and heart failure requiring placement of ICD.  He also has known aortic valve stenosis.  Unfortunately his transplanted kidney failed and he has been back on hemodialysis.  He has been getting hemodialysis at home through an AV graft fistula site on his left arm that he says has been present since 2003.  He had not been having any problems until he developed high fevers up to 102.8 dyspnea on exertion cough and headaches.  He came to the emergency department yesterday.  Suffered from some mild nausea but not had any vomiting.  He has had some profound fatigue and malaise.  In the ER he was tachycardic and hypotensive requiring volume resuscitation.  Chest x-ray had shown some cardiomegaly with some vascular congestion.  He was started on ceftriaxone and azithromycin for possible community acquired pneumonia.  Blood cultures have been taken and vancomycin was added around midnight the [employer] fired that he had MSSA in both sites of culture and I urged him to cefazolin.  Given the fact that he has an ICD he will clearly need a transesophageal echocardiogram and even if TEE does not show  vegetations he will need the device removed.  He will then need 6 weeks of IV cefazolin to ensure clearance of his bacteremia.  Additionally I have concerns about the AV fistula graft which while it does not overtly inferior infected needs to be evaluated by vascular surgery.  Certainly if this is the source of infection and is not addressed we will have to go through this entire process again which is not trivial.   Review of Systems: Review of Systems  Constitutional: Positive for chills, diaphoresis, fever and  malaise/fatigue. Negative for weight loss.  HENT: Negative for congestion, hearing loss, sore throat and tinnitus.   Eyes: Negative for blurred vision and double vision.  Respiratory: Positive for cough and sputum production. Negative for shortness of breath and wheezing.   Cardiovascular: Positive for palpitations. Negative for chest pain and leg swelling.  Gastrointestinal: Negative for abdominal pain, blood in stool, constipation, diarrhea, heartburn, melena, nausea and vomiting.  Genitourinary: Negative for dysuria, flank pain and hematuria.  Musculoskeletal: Negative for back pain, falls, joint pain and myalgias.  Skin: Negative for itching and rash.  Neurological: Positive for headaches. Negative for dizziness, sensory change, focal weakness, loss of consciousness and weakness.  Endo/Heme/Allergies: Does not bruise/bleed easily.  Psychiatric/Behavioral: Negative for depression, memory loss and suicidal ideas. The patient is not nervous/anxious.     Past Medical History:  Diagnosis Date  . AICD (automatic cardioverter/defibrillator) present    Medtronic   . Aortic stenosis    moderate AS by 08/2016 echo  . Arthritis    "knees" (08/05/2016)  . Atrial fibrillation ([place])   . ESRD (end stage renal disease) on dialysis ([place])    "M, T, W, T, F; NX stage hemodialysis; I do it at home" (08/05/2016)  . Great toe amputation status ([place])    status post left hallux amputation 08/01/14  . History of blood transfusion 2009   "S/P biopsy for prostate cancer check"  . Hypothyroidism (acquired)   . Mitral stenosis    moderate mitral stenosis  . Nonischemic cardiomyopathy ([place])   . Pneumonia 2014; 08/05/2016  . PONV (postoperative nausea and vomiting)   . Pulmonary HTN ([place])   . Thyroid cancer ([place]) 2011    Social History   Tobacco Use  . Smoking status: Never Smoker  . Smokeless tobacco: Never Used  Substance Use Topics  . Alcohol use: Yes    Alcohol/week: 0.0 oz    Comment: 08/05/2016  "might have 1 shot/month of liquor"  . Drug use: No    Family History  Problem Relation Age of Onset  . Heart failure Mother   . Hypertension Mother   . CAD Mother 16  . Emphysema Mother   . Hypertension Father   . Kidney failure Father    Allergies  Allergen Reactions  . Enalaprilat Swelling    Mouth swelling  . Vasotec [Enalapril] Swelling    MOUTH SWELLING  . Ivp Dye [Iodinated Diagnostic Agents] Nausea And Vomiting    OBJECTIVE: Blood pressure (!) 87/55, pulse 81, temperature 98.3 F (36.8 C), temperature source Oral, resp. rate 20, height 6\' 1"  (1.854 m), weight 276 lb 14.4 oz (125.6 kg), SpO2 94 %.  Physical Exam  Constitutional: He is oriented to person, place, and time and well-developed, well-nourished, and in no distress. No distress.  HENT:  Head: Normocephalic and atraumatic.  Right Ear: External ear normal.  Left Ear: External ear normal.  Nose: Nose normal.  Mouth/Throat: Oropharynx is clear and moist. No oropharyngeal exudate.  Eyes: Pupils are equal, round, and reactive to light. Conjunctivae and EOM are normal. No scleral icterus.  Neck: Normal range of motion. Neck supple.  Cardiovascular: Normal rate, regular rhythm and normal heart sounds. Exam reveals no gallop and no friction rub.  No murmur heard. Pulmonary/Chest: Effort normal and breath sounds normal. No respiratory distress. He has no wheezes. He has no rales.  Abdominal: Soft. Bowel sounds are normal. He exhibits no distension. There is no tenderness. There is no rebound.  Musculoskeletal: Normal range of motion. He exhibits no edema or tenderness.  Lymphadenopathy:    He has no cervical adenopathy.  Neurological: He is alert and oriented to person, place, and time. Gait normal. Coordination normal.  Skin: Skin is warm and dry. No rash noted. He is not diaphoretic. No erythema. No pallor.  Psychiatric: Mood, memory, affect and judgment normal.  His AV site is currently connected to HD machine  but not overtly purulent His ICD pocket is not overtly infected but of course that does not do anything to encourage me  He does have some areas of hyperpigmentation on his palms and soles some of which he believes are new see pictures below   August 18, 2017: Hands       Feet with amputation sites         I spent greater than 80  minutes with the patient including greater than 50% of time in face to face counsel of the patient during the severity of methicillin sensitive staph aureus bacteremia is which have a 20% mortality rate, regarding the nature of staph aureus bacteremia in the context of ICD presence I i.e. that the ICD is by nature infected regarding all the different procedures that would need to be done including transesophageal echocardiogram and in coordination of his  Care with Cardiology and EP     Lab Results Lab Results  Component Value Date   WBC 11.5 (H) 08/18/2017   HGB 9.7 (L) 08/18/2017   HCT 30.0 (L) 08/18/2017   MCV 89.0 08/18/2017   PLT 58 (L) 08/18/2017    Lab Results  Component Value Date   CREATININE 12.74 (H) 08/18/2017   BUN 60 (H) 08/18/2017   NA 132 (L) 08/18/2017   K 3.7 08/18/2017   CL 95 (L) 08/18/2017   CO2 19 (L) 08/18/2017    Lab Results  Component Value Date   ALT 13 (L) 08/17/2017   AST 24 08/17/2017   ALKPHOS 90 08/17/2017   BILITOT 1.1 08/17/2017     Microbiology: Recent Results (from the past 240 hour(s))  Blood culture (routine x 2)     Status: None (Preliminary result)   Collection Time: 08/17/17  9:22 AM  Result Value Ref Range Status   Specimen Description   Final    BLOOD RIGHT FOREARM Performed at [facility], [place] [address], [place], [place] [address]    Special Requests   Final    BOTTLES DRAWN AEROBIC AND ANAEROBIC Blood Culture adequate volume Performed at [facility] [address], [place], [place] [address]    Culture  Setup Time   Final    GRAM POSITIVE  COCCI IN BOTH AEROBIC AND ANAEROBIC BOTTLES Organism ID to follow CRITICAL RESULT CALLED TO, READ BACK BY AND VERIFIED WITH: A MASTERS PHARMD 08/18/17 0009 JDW Performed at [facility], [place] [address] [place], [place] [address]    Culture GRAM POSITIVE COCCI  Final   Report Status PENDING  Incomplete  Blood Culture ID Panel (Reflexed)     Status: Abnormal   Collection Time: 08/17/17  9:22 AM  Result Value Ref Range Status   Enterococcus species NOT DETECTED NOT DETECTED Final   Listeria monocytogenes NOT DETECTED NOT DETECTED Final   Staphylococcus species DETECTED (A) NOT DETECTED Final    Comment: CRITICAL RESULT CALLED TO, READ BACK BY AND VERIFIED WITH: A MASTERS PHARMD 08/18/17 0009 JDW    Staphylococcus aureus DETECTED (A) NOT DETECTED Final    Comment: Methicillin (oxacillin) susceptible Staphylococcus aureus (MSSA). Preferred therapy is anti staphylococcal beta lactam antibiotic (Cefazolin or Nafcillin), unless clinically contraindicated. CRITICAL RESULT CALLED TO, READ BACK BY AND VERIFIED WITH: A MASTERS PHARMD 08/18/17 0009 JDW    Methicillin resistance NOT DETECTED NOT DETECTED Final   Streptococcus species NOT DETECTED NOT DETECTED Final   Streptococcus agalactiae NOT DETECTED NOT DETECTED Final   Streptococcus pneumoniae NOT DETECTED NOT DETECTED Final   Streptococcus pyogenes NOT DETECTED NOT DETECTED Final   Acinetobacter baumannii NOT DETECTED NOT DETECTED Final   Enterobacteriaceae species NOT DETECTED NOT DETECTED Final   Enterobacter cloacae complex NOT DETECTED NOT DETECTED Final   Escherichia coli NOT DETECTED NOT DETECTED Final   Klebsiella oxytoca NOT DETECTED NOT DETECTED Final   Klebsiella pneumoniae NOT DETECTED NOT DETECTED Final   Proteus species NOT DETECTED NOT DETECTED Final   Serratia marcescens NOT DETECTED NOT DETECTED Final   Haemophilus influenzae NOT DETECTED NOT DETECTED Final   Neisseria meningitidis NOT DETECTED NOT DETECTED Final    Pseudomonas aeruginosa NOT DETECTED NOT DETECTED Final   Candida albicans NOT DETECTED NOT DETECTED Final   Candida glabrata NOT DETECTED NOT DETECTED Final   Candida krusei NOT DETECTED NOT DETECTED Final   Candida parapsilosis NOT DETECTED NOT DETECTED Final   Candida tropicalis NOT DETECTED NOT DETECTED Final    Comment: Performed at [facility], [place]. [address], [place], [place] [address]  Blood culture (routine x 2)     Status: None (Preliminary result)   Collection Time: 08/17/17  9:52 AM  Result Value Ref Range Status   Specimen Description   Final    BLOOD RIGHT FOREARM Performed at [facility] [address] [place], [place] [address]    Special Requests   Final    BOTTLES DRAWN AEROBIC AND ANAEROBIC Blood Culture adequate volume Performed at [facility] [address] [place], [place] [address]    Culture  Setup Time   Final    GRAM POSITIVE COCCI IN BOTH AEROBIC AND ANAEROBIC BOTTLES CRITICAL RESULT CALLED TO, READ BACK BY AND VERIFIED WITH: A MASTERS PHARMD 08/18/17 00009 JDW Performed at [facility], [place] [address] [place], [place] [address]    Culture GRAM POSITIVE COCCI  Final   Report Status PENDING  Incomplete  MRSA PCR Screening     Status: None   Collection Time: 08/17/17  8:40 PM  Result Value Ref Range Status   MRSA by PCR NEGATIVE NEGATIVE Final    Comment:        The GeneXpert MRSA Assay (FDA approved for NASAL specimens only), is one component of a comprehensive MRSA colonization surveillance program. It is not intended to diagnose MRSA infection nor to guide or monitor treatment for MRSA infections. Performed at [facility], [place] [address] [place], [place] [address]   Culture, blood (Routine X 2) w Reflex to ID Panel     Status: None (Preliminary result)  Collection Time: 08/18/17  3:05 AM  Result Value Ref Range Status   Specimen Description BLOOD RIGHT HAND  Final   Special  Requests   Final    BOTTLES DRAWN AEROBIC ONLY Blood Culture adequate volume Performed at [facility], [address], [place], [place] [address]    Culture PENDING  Incomplete   Report Status PENDING  Incomplete  Culture, blood (Routine X 2) w Reflex to ID Panel     Status: None (Preliminary result)   Collection Time: 08/18/17  3:05 AM  Result Value Ref Range Status   Specimen Description BLOOD RIGHT HAND  Final   Special Requests   Final    BOTTLES DRAWN AEROBIC ONLY Blood Culture adequate volume Performed at [facility], [place] [address], [place], [place] [address]    Culture PENDING  Incomplete   Report Status PENDING  Incomplete    Alcide Evener, [facility] [phone] pager  08/18/2017, 9:36 AM

## 2017-08-19 ENCOUNTER — Inpatient Hospital Stay (HOSPITAL_COMMUNITY): Payer: Medicare HMO

## 2017-08-19 ENCOUNTER — Encounter (HOSPITAL_COMMUNITY): Payer: Self-pay | Admitting: Anesthesiology

## 2017-08-19 ENCOUNTER — Encounter (HOSPITAL_COMMUNITY): Payer: Self-pay | Admitting: Cardiovascular Disease

## 2017-08-19 ENCOUNTER — Encounter (HOSPITAL_COMMUNITY)
Admission: EM | Disposition: A | Discharge status: Home / Self Care | Payer: Self-pay | Source: Home / Self Care | Attending: Family Medicine

## 2017-08-19 DIAGNOSIS — Z95828 Presence of other vascular implants and grafts: Secondary | ICD-10-CM

## 2017-08-19 DIAGNOSIS — R609 Edema, unspecified: Secondary | ICD-10-CM

## 2017-08-19 DIAGNOSIS — R7881 Bacteremia: Secondary | ICD-10-CM

## 2017-08-19 DIAGNOSIS — B9561 Methicillin susceptible Staphylococcus aureus infection as the cause of diseases classified elsewhere: Secondary | ICD-10-CM

## 2017-08-19 DIAGNOSIS — R791 Abnormal coagulation profile: Secondary | ICD-10-CM

## 2017-08-19 LAB — CBC
HEMATOCRIT: 30 % — AB (ref 39.0–52.0)
Hemoglobin: 9.5 g/dL — ABNORMAL LOW (ref 13.0–17.0)
MCH: 28.5 pg (ref 26.0–34.0)
MCHC: 31.7 g/dL (ref 30.0–36.0)
MCV: 90.1 fL (ref 78.0–100.0)
Platelets: 60 10*3/uL — ABNORMAL LOW (ref 150–400)
RBC: 3.33 MIL/uL — ABNORMAL LOW (ref 4.22–5.81)
RDW: 18.8 % — AB (ref 11.5–15.5)
WBC: 7.9 10*3/uL (ref 4.0–10.5)

## 2017-08-19 LAB — RENAL FUNCTION PANEL
Albumin: 2.9 g/dL — ABNORMAL LOW (ref 3.5–5.0)
Anion gap: 15 (ref 5–15)
BUN: 33 mg/dL — AB (ref 6–20)
CHLORIDE: 94 mmol/L — AB (ref 101–111)
CO2: 25 mmol/L (ref 22–32)
Calcium: 7.8 mg/dL — ABNORMAL LOW (ref 8.9–10.3)
Creatinine, Ser: 8.56 mg/dL — ABNORMAL HIGH (ref 0.61–1.24)
GFR calc Af Amer: 7 mL/min — ABNORMAL LOW (ref >60)
GFR calc non Af Amer: 6 mL/min — ABNORMAL LOW (ref >60)
GLUCOSE: 96 mg/dL (ref 65–99)
POTASSIUM: 3.6 mmol/L (ref 3.5–5.1)
Phosphorus: 3.8 mg/dL (ref 2.5–4.6)
Sodium: 134 mmol/L — ABNORMAL LOW (ref 135–145)

## 2017-08-19 LAB — PROTIME-INR
INR: 4.93
INR: 3.34
PROTHROMBIN TIME: 33.6 s — AB (ref 11.4–15.2)
Prothrombin Time: 45.6 seconds — ABNORMAL HIGH (ref 11.4–15.2)

## 2017-08-19 LAB — PREPARE RBC (CROSSMATCH)

## 2017-08-19 SURGERY — CANCELLED PROCEDURE

## 2017-08-19 MED ORDER — ALBUMIN HUMAN 25 % IV SOLN
25.0000 g | Freq: Once | INTRAVENOUS | Status: AC
  Administered 2017-08-19: 25 g via INTRAVENOUS
  Filled 2017-08-19: qty 100

## 2017-08-19 MED ORDER — SODIUM CHLORIDE 0.9 % IV SOLN: 250.0000 mL | INTRAVENOUS | Status: DC

## 2017-08-19 MED ORDER — ALBUMIN HUMAN 25 % IV SOLN
INTRAVENOUS | Status: AC
  Administered 2017-08-19: 25 g via INTRAVENOUS
  Filled 2017-08-19: qty 100

## 2017-08-19 MED ORDER — MIDODRINE HCL 5 MG PO TABS
ORAL_TABLET | ORAL | Status: AC
  Filled 2017-08-19: qty 2

## 2017-08-19 MED ORDER — SODIUM CHLORIDE 0.9 % IV SOLN
80.0000 mg | INTRAVENOUS | Status: AC
  Administered 2017-08-20: 80 mg
  Filled 2017-08-19: qty 80

## 2017-08-19 MED ORDER — SODIUM CHLORIDE 0.9 % IV SOLN
Freq: Once | INTRAVENOUS | Status: AC
  Administered 2017-08-20: 06:00:00 via INTRAVENOUS

## 2017-08-19 MED ORDER — SODIUM CHLORIDE 0.9 % IV SOLN: Freq: Once | INTRAVENOUS | Status: DC

## 2017-08-19 MED ORDER — CHLORHEXIDINE GLUCONATE 4 % EX LIQD
60.0000 mL | Freq: Once | CUTANEOUS | Status: DC
  Filled 2017-08-19: qty 60

## 2017-08-19 MED ORDER — PHYTONADIONE 5 MG PO TABS
5.0000 mg | ORAL_TABLET | Freq: Once | ORAL | Status: AC
  Administered 2017-08-19: 5 mg via ORAL
  Filled 2017-08-19: qty 1

## 2017-08-19 MED ORDER — MIDODRINE HCL 5 MG PO TABS
ORAL_TABLET | ORAL | Status: AC
  Administered 2017-08-19: 10 mg via ORAL
  Filled 2017-08-19: qty 2

## 2017-08-19 MED ORDER — SODIUM CHLORIDE 0.9% FLUSH: 3.0000 mL | INTRAVENOUS | Status: DC | PRN

## 2017-08-19 MED ORDER — ALBUMIN HUMAN 25 % IV SOLN
25.0000 g | Freq: Once | INTRAVENOUS | Status: AC
  Administered 2017-08-19: 25 g via INTRAVENOUS

## 2017-08-19 MED ORDER — DEXTROSE 5 % IV SOLN
3.0000 g | INTRAVENOUS | Status: DC
  Filled 2017-08-19: qty 3000

## 2017-08-19 MED ORDER — SODIUM CHLORIDE 0.9% FLUSH
3.0000 mL | Freq: Two times a day (BID) | INTRAVENOUS | Status: DC
  Administered 2017-08-19: 3 mL via INTRAVENOUS

## 2017-08-19 MED ORDER — CHLORHEXIDINE GLUCONATE 4 % EX LIQD: 60.0000 mL | Freq: Once | CUTANEOUS | Status: DC

## 2017-08-19 MED ORDER — SODIUM CHLORIDE 0.9 % IV SOLN: INTRAVENOUS | Status: DC

## 2017-08-19 NOTE — Progress Notes (Signed)
Vascular u/s shows mixed echos around anastamosis with hematoma.  Continues to have pain at fistula.  Can not rule out possible infection.  He would require brachial artery revision if fistula is to be excised.  Continue to treat with abx.  If he does not improve, I could excise his fistula and place a TDC, with plans for new access at a later date.   Adam [name]

## 2017-08-19 NOTE — Procedures (Signed)
Patient was seen on dialysis and the procedure was supervised.  BFR 400  Via AVF BP is  82/59- normal for pt.   Patient appears to be tolerating treatment well  Adam [name] A 08/19/2017

## 2017-08-19 NOTE — Progress Notes (Signed)
[facility] for Warfarin Indication: atrial fibrillation  Allergies  Allergen Reactions  . Enalaprilat Swelling    Mouth swelling  . Vasotec [Enalapril] Swelling    MOUTH SWELLING  . Ivp Dye [Iodinated Diagnostic Agents] Nausea And Vomiting    Patient Measurements: Height: 6\' 1"  (185.4 cm) Weight: 273 lb 5.9 oz (124 kg) IBW/kg (Calculated) : 79.9   Vital Signs: Temp: 98.6 F (37 C) (04/10 0442) Temp Source: Oral (04/10 0442) BP: 72/52 (04/10 0544) Pulse Rate: 78 (04/10 0442)  Labs: Recent Labs    08/17/17 0902 08/17/17 1838 08/18/17 0305 08/19/17 0249  HGB 10.5*  --  9.7* 9.5*  HCT 33.0*  --  30.0* 30.0*  PLT 78*  --  58* 60*  LABPROT  --  37.8* 43.0* 45.6*  INR  --  3.88 4.58* 4.93*  CREATININE 10.98*  --  12.74* 8.56*    Estimated Creatinine Clearance: 13.6 mL/min (A) (by C-G formula based on SCr of 8.56 mg/dL (H)).  Assessment: 36 y/oM with PMH of ESRD, hypothyroidism, pulmonary hypertension, AS, MS, and paroxysmal atrial fibrillation on chronic warfarin at home admitted for sepsis from MSSA bacteremia. Pharmacy consulted to manage warfarin therapy while patient admitted. Noted plans for ICD removal (vitamin K po has been ordered) -INR remains supratherapeutic and increased, INR 3.88 >> 493. No bleeding noted, Hgb 9.7, platelets are low at 60 (100-120s normally).  PTA dose reported as 9mg  daily except 6mg  on Sundays   Goal of Therapy:  INR 2-3 Monitor platelets by anticoagulation protocol: Yes   Plan:  Hold warfarin Daily INR  Hildred Laser, PharmD Clinical Pharmacist Clinical phone from 8:30-4:00 is [phone] After 4pm, please call Main Rx [phone]) for assistance. 08/19/2017 9:01 AM

## 2017-08-19 NOTE — Progress Notes (Signed)
Subjective:  Seen in HD- tmax 101.7- BP still low but not outside normal for pt per him yesterday- c/o sore spot on access which he said started on Sunday but did not report until yest.  VVS has been consulted.   Echo does not show veg but considering removal of ICD ?    Objective Vital signs in last 24 hours: Vitals:   08/19/17 0010 08/19/17 0442 08/19/17 0505 08/19/17 0544  BP: (!) 75/53 (!) 61/43 (!) 71/50 (!) 72/52  Pulse: 77 78    Resp: 18 18 (!) 21 (!) 21  Temp: 98.4 F (36.9 C) 98.6 F (37 C)    TempSrc: Oral Oral    SpO2: 100% 99%    Weight:  124 kg (273 lb 5.9 oz)    Height:       Weight change: 1.529 kg (3 lb 5.9 oz)  Intake/Output Summary (Last 24 hours) at 08/19/2017 0826 Last data filed at 08/19/2017 0513 Gross per 24 hour  Intake 480 ml  Output 1505 ml  Net -1025 ml    Assessment/Plan: 57 year old BM with multiple medical problems including ESRD- Presenting with fever and shortness of breath with elevated white count and low blood pressure 1 Fever and elevated white blood count- the presence of low blood pressure although is typical for the patient brings to mind sepsis. Blood cultures growing MSSA - given ancef, ceftriaxone, vanc and also azithro - now on ancef.  New c/o access pain- VVS on board and to get duplex- for TEE and possible ICD removal  2 ESRD: Normally does home hemodialysis 5 days per week, done Friday at home and then Tuesday. HD today and then likely Thursday next.  Fistula did not appear to be source of infection but now not sure 3 Hypertension: Patient is actually normally baseline hypotensive. He takes Midodrine pre-and posttreatment- This practice will be continued here. He does have some lower extremity edema so will ultrafilter gently,  Pre weight today 124- 1 kg under EDW 4. Anemia of ESRD: Hemoglobin here 10.4, now 9.5. - recently given venofer as OP- mircera 225 given 3/28.  Nothing here yet- INR is up- coumadin on hold 5. Metabolic Bone Disease:  Is on sensipar 90 mg daily, phoslo 2-4 TID and calcitriol 0.25 daily - continue here     [facility]: Basic Metabolic Panel: Recent Labs  Lab 08/17/17 0902 08/18/17 0305 08/19/17 0249  NA 133* 132* 134*  K 4.2 3.7 3.6  CL 93* 95* 94*  CO2 21* 19* 25  GLUCOSE 106* 96 96  BUN 55* 60* 33*  CREATININE 10.98* 12.74* 8.56*  CALCIUM 8.2* 7.8* 7.8*  PHOS  --  5.7* 3.8   Liver Function Tests: Recent Labs  Lab 08/17/17 0902 08/18/17 0305 08/19/17 0249  AST 24  --   --   ALT 13*  --   --   ALKPHOS 90  --   --   BILITOT 1.1  --   --   PROT 6.8  --   --   ALBUMIN 3.4* 3.0* 2.9*   No results for input(s): LIPASE, AMYLASE in the last 168 hours. No results for input(s): AMMONIA in the last 168 hours. CBC: Recent Labs  Lab 08/17/17 0902 08/18/17 0305 08/19/17 0249  WBC 12.0* 11.5* 7.9  NEUTROABS 11.1*  --   --   HGB 10.5* 9.7* 9.5*  HCT 33.0* 30.0* 30.0*  MCV 90.7 89.0 90.1  PLT 78* 58* 60*  Cardiac Enzymes: No results for input(s): CKTOTAL, CKMB, CKMBINDEX, TROPONINI in the last 168 hours. CBG: No results for input(s): GLUCAP in the last 168 hours.  Iron Studies: No results for input(s): IRON, TIBC, TRANSFERRIN, FERRITIN in the last 72 hours. Studies/Results: Dg Chest 2 View  Result Date: 08/17/2017 CLINICAL DATA:  Fever and SOB.  Symptoms for 1 week. EXAM: CHEST - 2 VIEW COMPARISON:  11/11/2016. FINDINGS: Cardiomegaly. Dual lead pacer. Mild vascular congestion without overt edema or consolidation. No effusion or pneumothorax. Vascular stent overlies the LEFT axilla. IMPRESSION: Cardiomegaly with mild vascular congestion but no frank edema or consolidation. Similar appearance to priors. Electronically Signed   By: Staci Righter M.D.   On: 08/17/2017 09:24   Dg Shoulder Left  Result Date: 08/18/2017 CLINICAL DATA:  Left upper arm pain and bulge in the distal humerus. Fistula inserted in 2003. EXAM: LEFT SHOULDER - 2+ VIEW COMPARISON:  None. FINDINGS:  A vascular graft is seen projecting over the left shoulder along the expected location of the distal subclavian and axillary vessels. Osteoarthritis of the glenohumeral joint with joint space narrowing and subchondral sclerosis of the humeral head. AC joint is intact. The adjacent ribs and lung are nonacute. There is moderate aortic atherosclerosis at the arch. IMPRESSION: Osteoarthritis of the glenohumeral joint. No acute osseous abnormality. Overlying vascular stent is in place. Electronically Signed   By: Ashley Royalty M.D.   On: 08/18/2017 19:05   Dg Humerus Left  Result Date: 08/18/2017 CLINICAL DATA:  Left upper arm pain with small bulge along the distal humerus. EXAM: LEFT HUMERUS - 2+ VIEW COMPARISON:  None. FINDINGS: A vascular stent is seen along the volar aspect of the distal forearm which appears discontinuous for a length of 17 mm posteriorly and 21 mm along its radial aspect. A portion of the stent appears to have peeled away resulting in the gap. Volar bowing of the vascular stent is noted on the lateral view which may be contributing to the patient's small bulge noted. Subclavian and axillary stent is also seen projecting over the shoulder. Atherosclerotic calcifications are identified along the brachial artery. No acute fracture or bone destruction of the humerus. No joint dislocation. Osteoarthritis of the glenohumeral joint with inferomedial spurring off the humeral head. IMPRESSION: 1. Discontinuous vascular stent along the distal arm with volar bowing as above described. There is a small gap associated with the stent measuring 17 mm posteriorly and 21 mm along its radial aspect with peeling away of a portion of the stent believed to account for this gap. 2. Intact subclavian and axillary stent projecting over the shoulder. 3. No acute osseous abnormality. Electronically Signed   By: Ashley Royalty M.D.   On: 08/18/2017 19:10   Medications: Infusions: .  ceFAZolin (ANCEF) IV 1 g (08/18/17 1726)     Scheduled Medications: . midodrine      . ambrisentan  10 mg Oral Daily  . amiodarone  200 mg Oral QPM  . atorvastatin  40 mg Oral QPM  . calcitRIOL  0.25 mcg Oral Daily  . calcium acetate  1,334 mg Oral TID WC  . cinacalcet  90 mg Oral QPM  . levothyroxine  300 mcg Oral QAC breakfast  . midodrine  10 mg Oral 3 times per day  . phytonadione  5 mg Oral Once  . Warfarin - Pharmacist Dosing Inpatient   Does not apply q1800    have reviewed scheduled and prn medications.  Physical Exam: General: alert Heart: RRR- no  [place] Lungs: mostly clear Abdomen: soft, non tender Extremities: mild edema Dialysis Access: left upper AVF- sore area not fluctuant   08/19/2017,8:26 AM  LOS: 2 days

## 2017-08-19 NOTE — Progress Notes (Addendum)
Text page Forrest Moron N.P. INR critical 4.93 . R.N. Aware.

## 2017-08-19 NOTE — Progress Notes (Signed)
PROGRESS NOTE    Adam Alm.   LKG:[id number]  DOB: Jul 29, 1960  DOA: 08/17/2017 PCP: [name], MD   Brief Narrative:  Adam Alm. is a 57 y/o with ESRD on home hemodialysis on Monday through Friday for the past year, atrial fibrillation on Coumadin, chronic hypertension, nonischemic cardiomyopathy with AICD, pulmonary hypertension, moderate aortic stenosis He has been feeling well up until one day prior to admission when he developed a fever (102.8) and dyspnea.  Apparently his wife stated that he had felt warm for a few days and may have been having fevers and not realizing it.  He has been doing his dialysis and his fistula has felt fine.  He had a dry cough for 1 week. He was admitted at [facility] in transfer to [facility] for ongoing dialysis.  Blood cultures became positive for MSSA. He received a fluid bolus yesterday and albumin infusion this morning (by nephrology) for hypotension.  He is also on midodrine at home.  Subjective: Pt has no new complaints currently.   Assessment & Plan:   Principal Problem:   MSSA bacteremia with sepsis (fevers leukocytosis hypotension) -In setting of AICD  -Appreciate management by ID-has been transitioned to Ancef and Gentamycin - plan is for removal of defibillator and vascular evaluating AV fistula (suspecting infection)  Active Problems:    Atrial fibrillation-Elevated INR - continue patient on Amiodarone - pharmacy managing coumadin  Acute on chronic thrombocytopenia -It appears that platelets have been between 101- 150 in the past - INR is elevated and not on any other anticoagulation -Continue bleeding precautions   ESRD/acute on chronic hypotension - on dialysis Monday through Friday and midodrine -Fistula is not infected-  -Being managed by nephrology  Severe pulmonary hypertension Tadalafil is being replaced with Cialis-we will need to hold this due to hypotension -Resume ambrisentan  today  Hyperlipidemia -Continue statin  Hypothyroidism Synthroid  DVT prophylaxis: INR elevated-Coumadin Code Status: Full code Family Communication:  Disposition Plan: Follow-up on MSSA bacteremia and hypotension- Consultants:   ID  Nephrology Procedures:    Antimicrobials:  Anti-infectives (From admission, onward)   Start     Dose/Rate Route Frequency Ordered Stop   08/20/17 1300  gentamicin (GARAMYCIN) 80 mg in sodium chloride 0.9 % 500 mL irrigation     80 mg Irrigation To ShortStay [facility] 08/19/17 1351 08/21/17 1300   08/20/17 1200  ceFAZolin (ANCEF) 3 g in dextrose 5 % 50 mL IVPB     3 g 130 mL/hr over 30 Minutes Intravenous To ShortStay Surgical 08/19/17 1351 08/21/17 1200   08/18/17 1800  ceFAZolin (ANCEF) IVPB 1 g/50 mL premix     1 g 100 mL/hr over 30 Minutes Intravenous Every 24 hours 08/18/17 0035     08/18/17 1000  cefTRIAXone (ROCEPHIN) 1 g in sodium chloride 0.9 % 100 mL IVPB  Status:  Discontinued     1 g 200 mL/hr over 30 Minutes Intravenous Every 24 hours 08/17/17 1151 08/18/17 0035   08/18/17 0100  ceFAZolin (ANCEF) IVPB 2g/100 mL premix     2 g 200 mL/hr over 30 Minutes Intravenous  Once 08/18/17 0035 08/18/17 0127   08/17/17 1600  azithromycin (ZITHROMAX) tablet 500 mg  Status:  Discontinued     500 mg Oral Daily 08/17/17 1505 08/18/17 1053   08/17/17 1200  vancomycin (VANCOCIN) 2,000 mg in sodium chloride 0.9 % 500 mL IVPB     2,000 mg 250 mL/hr over 120 Minutes Intravenous  Once 08/17/17  1144 08/17/17 1526   08/17/17 1115  cefTRIAXone (ROCEPHIN) 1 g in sodium chloride 0.9 % 100 mL IVPB     1 g 200 mL/hr over 30 Minutes Intravenous  Once 08/17/17 1111 08/17/17 1313   08/17/17 1115  azithromycin (ZITHROMAX) tablet 500 mg     500 mg Oral  Once 08/17/17 1111 08/17/17 1139       Objective: Vitals:   08/19/17 1130 08/19/17 1200 08/19/17 1212 08/19/17 1245  BP: (!) 86/55 (!) 82/51 (!) 85/50   Pulse: 76 63 82 77  Resp:   (!) 21   Temp:   (!)  97.5 F (36.4 C) 97.7 F (36.5 C)  TempSrc:   Oral Oral  SpO2:   100% 99%  Weight:   121.7 kg (268 lb 4.8 oz)   Height:        Intake/Output Summary (Last 24 hours) at 08/19/2017 1530 Last data filed at 08/19/2017 1212 Gross per 24 hour  Intake 240 ml  Output 2002 ml  Net -1762 ml   Filed Weights   08/19/17 0442 08/19/17 0758 08/19/17 1212  Weight: 124 kg (273 lb 5.9 oz) 123.9 kg (273 lb 2.4 oz) 121.7 kg (268 lb 4.8 oz)    Examination: General exam: Appears comfortable, in nad.  HEENT: PERRLA, oral mucosa moist, no sclera icterus or thrush Respiratory system: Clear to auscultation.  Has a dry cough respiratory effort normal. Cardiovascular system: S1 & S2 heard, RRR.   Gastrointestinal system: Abdomen soft, non-tender, nondistended. Normal bowel sound. No organomegaly Central nervous system: Alert and oriented. No focal neurological deficits. Extremities: No cyanosis, clubbing or edema Skin: No rashes or ulcers Psychiatry:  Mood & affect appropriate.     Data Reviewed: I have personally reviewed following labs and imaging studies  CBC: Recent Labs  Lab 08/17/17 0902 08/18/17 0305 08/19/17 0249  WBC 12.0* 11.5* 7.9  NEUTROABS 11.1*  --   --   HGB 10.5* 9.7* 9.5*  HCT 33.0* 30.0* 30.0*  MCV 90.7 89.0 90.1  PLT 78* 58* 60*   Basic Metabolic Panel: Recent Labs  Lab 08/17/17 0902 08/18/17 0305 08/19/17 0249  NA 133* 132* 134*  K 4.2 3.7 3.6  CL 93* 95* 94*  CO2 21* 19* 25  GLUCOSE 106* 96 96  BUN 55* 60* 33*  CREATININE 10.98* 12.74* 8.56*  CALCIUM 8.2* 7.8* 7.8*  PHOS  --  5.7* 3.8   GFR: Estimated Creatinine Clearance: 13.5 mL/min (A) (by C-G formula based on SCr of 8.56 mg/dL (H)). Liver Function Tests: Recent Labs  Lab 08/17/17 0902 08/18/17 0305 08/19/17 0249  AST 24  --   --   ALT 13*  --   --   ALKPHOS 90  --   --   BILITOT 1.1  --   --   PROT 6.8  --   --   ALBUMIN 3.4* 3.0* 2.9*   No results for input(s): LIPASE, AMYLASE in the last  168 hours. No results for input(s): AMMONIA in the last 168 hours. Coagulation Profile: Recent Labs  Lab 08/17/17 1838 08/18/17 0305 08/19/17 0249  INR 3.88 4.58* 4.93*   Cardiac Enzymes: No results for input(s): CKTOTAL, CKMB, CKMBINDEX, TROPONINI in the last 168 hours. BNP (last 3 results) No results for input(s): PROBNP in the last 8760 hours. HbA1C: No results for input(s): HGBA1C in the last 72 hours. CBG: No results for input(s): GLUCAP in the last 168 hours. Lipid Profile: No results for input(s): CHOL, HDL, LDLCALC, TRIG, CHOLHDL,  LDLDIRECT in the last 72 hours. Thyroid Function Tests: No results for input(s): TSH, T4TOTAL, FREET4, T3FREE, THYROIDAB in the last 72 hours. Anemia Panel: No results for input(s): VITAMINB12, FOLATE, FERRITIN, TIBC, IRON, RETICCTPCT in the last 72 hours. Urine analysis: No results found for: COLORURINE, APPEARANCEUR, LABSPEC, PHURINE, GLUCOSEU, HGBUR, BILIRUBINUR, KETONESUR, PROTEINUR, UROBILINOGEN, NITRITE, LEUKOCYTESUR Sepsis Labs: @LABRCNTIP (procalcitonin:4,lacticidven:4) ) Recent Results (from the past 240 hour(s))  Blood culture (routine x 2)     Status: Abnormal (Preliminary result)   Collection Time: 08/17/17  9:22 AM  Result Value Ref Range Status   Specimen Description   Final    BLOOD RIGHT FOREARM Performed at [facility] [address] [place], [place] [address]    Special Requests   Final    BOTTLES DRAWN AEROBIC AND ANAEROBIC Blood Culture adequate volume Performed at [facility] [address] [place], [place] [address]    Culture  Setup Time   Final    GRAM POSITIVE COCCI IN BOTH AEROBIC AND ANAEROBIC BOTTLES Organism ID to follow CRITICAL RESULT CALLED TO, READ BACK BY AND VERIFIED WITH: A MASTERS PHARMD 08/18/17 0009 JDW Performed at [facility], [place] [address] [place], [place] [address]    Culture STAPHYLOCOCCUS AUREUS (A)  Final   Report Status PENDING   Incomplete  Blood Culture ID Panel (Reflexed)     Status: Abnormal   Collection Time: 08/17/17  9:22 AM  Result Value Ref Range Status   Enterococcus species NOT DETECTED NOT DETECTED Final   Listeria monocytogenes NOT DETECTED NOT DETECTED Final   Staphylococcus species DETECTED (A) NOT DETECTED Final    Comment: CRITICAL RESULT CALLED TO, READ BACK BY AND VERIFIED WITH: A MASTERS PHARMD 08/18/17 0009 JDW    Staphylococcus aureus DETECTED (A) NOT DETECTED Final    Comment: Methicillin (oxacillin) susceptible Staphylococcus aureus (MSSA). Preferred therapy is anti staphylococcal beta lactam antibiotic (Cefazolin or Nafcillin), unless clinically contraindicated. CRITICAL RESULT CALLED TO, READ BACK BY AND VERIFIED WITH: A MASTERS PHARMD 08/18/17 0009 JDW    Methicillin resistance NOT DETECTED NOT DETECTED Final   Streptococcus species NOT DETECTED NOT DETECTED Final   Streptococcus agalactiae NOT DETECTED NOT DETECTED Final   Streptococcus pneumoniae NOT DETECTED NOT DETECTED Final   Streptococcus pyogenes NOT DETECTED NOT DETECTED Final   Acinetobacter baumannii NOT DETECTED NOT DETECTED Final   Enterobacteriaceae species NOT DETECTED NOT DETECTED Final   Enterobacter cloacae complex NOT DETECTED NOT DETECTED Final   Escherichia coli NOT DETECTED NOT DETECTED Final   Klebsiella oxytoca NOT DETECTED NOT DETECTED Final   Klebsiella pneumoniae NOT DETECTED NOT DETECTED Final   Proteus species NOT DETECTED NOT DETECTED Final   Serratia marcescens NOT DETECTED NOT DETECTED Final   Haemophilus influenzae NOT DETECTED NOT DETECTED Final   Neisseria meningitidis NOT DETECTED NOT DETECTED Final   Pseudomonas aeruginosa NOT DETECTED NOT DETECTED Final   Candida albicans NOT DETECTED NOT DETECTED Final   Candida glabrata NOT DETECTED NOT DETECTED Final   Candida krusei NOT DETECTED NOT DETECTED Final   Candida parapsilosis NOT DETECTED NOT DETECTED Final   Candida tropicalis NOT DETECTED NOT  DETECTED Final    Comment: Performed at [facility], [place]. [address], [place],  [address]  Blood culture (routine x 2)     Status: Abnormal (Preliminary result)   Collection Time: 08/17/17  9:52 AM  Result Value Ref Range Status   Specimen Description   Final    BLOOD RIGHT  FOREARM Performed at [facility], [place] [address], [place], [place] [address]    Special Requests   Final    BOTTLES DRAWN AEROBIC AND ANAEROBIC Blood Culture adequate volume Performed at [facility] [address] [place], [place] [address]    Culture  Setup Time   Final    GRAM POSITIVE COCCI IN BOTH AEROBIC AND ANAEROBIC BOTTLES CRITICAL RESULT CALLED TO, READ BACK BY AND VERIFIED WITH: A MASTERS PHARMD 08/18/17 00009 JDW Performed at [facility], [place] [address], [place], [place] [address]    Culture STAPHYLOCOCCUS AUREUS (A)  Final   Report Status PENDING  Incomplete  MRSA PCR Screening     Status: None   Collection Time: 08/17/17  8:40 PM  Result Value Ref Range Status   MRSA by PCR NEGATIVE NEGATIVE Final    Comment:        The GeneXpert MRSA Assay (FDA approved for NASAL specimens only), is one component of a comprehensive MRSA colonization surveillance program. It is not intended to diagnose MRSA infection nor to guide or monitor treatment for MRSA infections. Performed at [facility], [place] [address], [place], [place] [address]   Culture, blood (Routine X 2) w Reflex to ID Panel     Status: None (Preliminary result)   Collection Time: 08/18/17  3:05 AM  Result Value Ref Range Status   Specimen Description BLOOD RIGHT HAND  Final   Special Requests   Final    BOTTLES DRAWN AEROBIC ONLY Blood Culture adequate volume   Culture   Final    NO GROWTH 1 DAY Performed at [facility], [place] [address], [place], [place] [address]    Report Status PENDING  Incomplete  Culture, blood (Routine X 2) w Reflex to ID Panel     Status:  None (Preliminary result)   Collection Time: 08/18/17  3:05 AM  Result Value Ref Range Status   Specimen Description BLOOD RIGHT HAND  Final   Special Requests   Final    BOTTLES DRAWN AEROBIC ONLY Blood Culture adequate volume   Culture   Final    NO GROWTH 1 DAY Performed at [facility], [place] [address] [place], [place] [address]    Report Status PENDING  Incomplete  Culture, sputum-assessment     Status: None   Collection Time: 08/18/17  9:32 PM  Result Value Ref Range Status   Specimen Description EXPECTORATED SPUTUM  Final   Special Requests NONE  Final   Sputum evaluation   Final    RESULT CALLED TO, READ BACK BY AND VERIFIED WITH: Miguel Dibble RN [id number] 08/18/17 A BROWNING Performed at [facility], [place] [address] [place], [place] [address]    Report Status 08/18/2017 FINAL  Final         Radiology Studies: Dg Shoulder Left  Result Date: 08/18/2017 CLINICAL DATA:  Left upper arm pain and bulge in the distal humerus. Fistula inserted in 2003. EXAM: LEFT SHOULDER - 2+ VIEW COMPARISON:  None. FINDINGS: A vascular graft is seen projecting over the left shoulder along the expected location of the distal subclavian and axillary vessels. Osteoarthritis of the glenohumeral joint with joint space narrowing and subchondral sclerosis of the humeral head. AC joint is intact. The adjacent ribs and lung are nonacute. There is moderate aortic atherosclerosis at the arch. IMPRESSION: Osteoarthritis of the glenohumeral joint. No acute osseous abnormality. Overlying vascular stent is in place. Electronically  Signed   By: Ashley Royalty M.D.   On: 08/18/2017 19:05   Dg Humerus Left  Result Date: 08/18/2017 CLINICAL DATA:  Left upper arm pain with small bulge along the distal humerus. EXAM: LEFT HUMERUS - 2+ VIEW COMPARISON:  None. FINDINGS: A vascular stent is seen along the volar aspect of the distal forearm which appears discontinuous for a length of 17 mm posteriorly and 21 mm along its  radial aspect. A portion of the stent appears to have peeled away resulting in the gap. Volar bowing of the vascular stent is noted on the lateral view which may be contributing to the patient's small bulge noted. Subclavian and axillary stent is also seen projecting over the shoulder. Atherosclerotic calcifications are identified along the brachial artery. No acute fracture or bone destruction of the humerus. No joint dislocation. Osteoarthritis of the glenohumeral joint with inferomedial spurring off the humeral head. IMPRESSION: 1. Discontinuous vascular stent along the distal arm with volar bowing as above described. There is a small gap associated with the stent measuring 17 mm posteriorly and 21 mm along its radial aspect with peeling away of a portion of the stent believed to account for this gap. 2. Intact subclavian and axillary stent projecting over the shoulder. 3. No acute osseous abnormality. Electronically Signed   By: Ashley Royalty M.D.   On: 08/18/2017 19:10      Scheduled Meds: . ambrisentan  10 mg Oral Daily  . amiodarone  200 mg Oral QPM  . atorvastatin  40 mg Oral QPM  . calcitRIOL  0.25 mcg Oral Daily  . calcium acetate  1,334 mg Oral TID WC  . chlorhexidine  60 mL Topical Once  . chlorhexidine  60 mL Topical Once  . cinacalcet  90 mg Oral QPM  . [START ON 08/20/2017] gentamicin irrigation  80 mg Irrigation To SS-Surg  . levothyroxine  300 mcg Oral QAC breakfast  . midodrine  10 mg Oral 3 times per day  . sodium chloride flush  3 mL Intravenous Q12H  . Warfarin - Pharmacist Dosing Inpatient   Does not apply q1800   Continuous Infusions: . sodium chloride    . sodium chloride    . sodium chloride    . [START ON 08/20/2017]  ceFAZolin (ANCEF) IV    .  ceFAZolin (ANCEF) IV 1 g (08/18/17 1726)     LOS: 2 days    Time spent in minutes: [place], MD [facility] Pager:[phone] [email or web address] Password [email or web address] 08/19/2017, 3:30 PM

## 2017-08-19 NOTE — Progress Notes (Signed)
Back from Dialysis. Tolerated well. Checked on TEE time and not on schedule. Called Endoscopy and the procedure was cancelled due to INR. Consent done for the ICD removal for 08/20/17. Updated pt on his plan of care. Pola Corn, RN

## 2017-08-19 NOTE — Progress Notes (Signed)
LUE duplex dialysis access completed. Landry Mellow, RDMS, RVT

## 2017-08-19 NOTE — Progress Notes (Addendum)
Subjective: Complaining of pain on left where he has PIV and also of NEW spot in midst of his AV fistula which is swollen and tender   Antibiotics:  Anti-infectives (From admission, onward)   Start     Dose/Rate Route Frequency Ordered Stop   08/18/17 1800  ceFAZolin (ANCEF) IVPB 1 g/50 mL premix     1 g 100 mL/hr over 30 Minutes Intravenous Every 24 hours 08/18/17 0035     08/18/17 1000  cefTRIAXone (ROCEPHIN) 1 g in sodium chloride 0.9 % 100 mL IVPB  Status:  Discontinued     1 g 200 mL/hr over 30 Minutes Intravenous Every 24 hours 08/17/17 1151 08/18/17 0035   08/18/17 0100  ceFAZolin (ANCEF) IVPB 2g/100 mL premix     2 g 200 mL/hr over 30 Minutes Intravenous  Once 08/18/17 0035 08/18/17 0127   08/17/17 1600  azithromycin (ZITHROMAX) tablet 500 mg  Status:  Discontinued     500 mg Oral Daily 08/17/17 1505 08/18/17 1053   08/17/17 1200  vancomycin (VANCOCIN) 2,000 mg in sodium chloride 0.9 % 500 mL IVPB     2,000 mg 250 mL/hr over 120 Minutes Intravenous  Once 08/17/17 1144 08/17/17 1526   08/17/17 1115  cefTRIAXone (ROCEPHIN) 1 g in sodium chloride 0.9 % 100 mL IVPB     1 g 200 mL/hr over 30 Minutes Intravenous  Once 08/17/17 1111 08/17/17 1313   08/17/17 1115  azithromycin (ZITHROMAX) tablet 500 mg     500 mg Oral  Once 08/17/17 1111 08/17/17 1139      Medications: Scheduled Meds: . ambrisentan  10 mg Oral Daily  . amiodarone  200 mg Oral QPM  . atorvastatin  40 mg Oral QPM  . calcitRIOL  0.25 mcg Oral Daily  . calcium acetate  1,334 mg Oral TID WC  . chlorhexidine  60 mL Topical Once  . chlorhexidine  60 mL Topical Once  . cinacalcet  90 mg Oral QPM  . levothyroxine  300 mcg Oral QAC breakfast  . midodrine  10 mg Oral 3 times per day  . Warfarin - Pharmacist Dosing Inpatient   Does not apply q1800   Continuous Infusions: . sodium chloride    .  ceFAZolin (ANCEF) IV 1 g (08/18/17 1726)   PRN Meds:.acetaminophen **OR** acetaminophen, oxyCODONE,  promethazine    Objective: Weight change: 3 lb 5.9 oz (1.529 kg)  Intake/Output Summary (Last 24 hours) at 08/19/2017 1347 Last data filed at 08/19/2017 1212 Gross per 24 hour  Intake 240 ml  Output 2002 ml  Net -1762 ml   Blood pressure (!) 85/50, pulse 82, temperature (!) 97.5 F (36.4 C), temperature source Oral, resp. rate (!) 21, height 6\' 1"  (1.854 m), weight 268 lb 4.8 oz (121.7 kg), SpO2 100 %. Temp:  [97.4 F (36.3 C)-101.7 F (38.7 C)] 97.5 F (36.4 C) (04/10 1212) Pulse Rate:  [63-99] 82 (04/10 1212) Resp:  [18-29] 21 (04/10 1212) BP: (61-93)/(12-61) 85/50 (04/10 1212) SpO2:  [92 %-100 %] 100 % (04/10 1212) Weight:  [268 lb 4.8 oz (121.7 kg)-273 lb 5.9 oz (124 kg)] 268 lb 4.8 oz (121.7 kg) (04/10 1212)  Physical Exam: General: Alert and awake, oriented x3, not in any acute distress. HEENT: anicteric sclera,EOMI CVS regular rate, normal r,  no murmur rubs or gallops, ICD site clean Chest: clear to auscultation bilaterally, no wheezing, rales or rhonchi Abdomen: soft nontender, nondistended, normal bowel sounds, Extremities: skin  --he may  have beginnings of superficial thrombus on right PIV site  AVF site has an area of edema he shows me (he is getting HD at present)  Neuro: nonfocal  CBC:  CBC Latest Ref Rng & Units 08/19/2017 08/18/2017 08/17/2017  WBC 4.0 - 10.5 K/uL 7.9 11.5(H) 12.0(H)  Hemoglobin 13.0 - 17.0 g/dL 9.5(L) 9.7(L) 10.5(L)  Hematocrit 39.0 - 52.0 % 30.0(L) 30.0(L) 33.0(L)  Platelets 150 - 400 K/uL 60(L) 58(L) 78(L)     BMET Recent Labs    08/18/17 0305 08/19/17 0249  NA 132* 134*  K 3.7 3.6  CL 95* 94*  CO2 19* 25  GLUCOSE 96 96  BUN 60* 33*  CREATININE 12.74* 8.56*  CALCIUM 7.8* 7.8*     Liver Panel  Recent Labs    08/17/17 0902 08/18/17 0305 08/19/17 0249  PROT 6.8  --   --   ALBUMIN 3.4* 3.0* 2.9*  AST 24  --   --   ALT 13*  --   --   ALKPHOS 90  --   --   BILITOT 1.1  --   --        Sedimentation Rate No  results for input(s): ESRSEDRATE in the last 72 hours. C-Reactive Protein No results for input(s): CRP in the last 72 hours.  Micro Results: Recent Results (from the past 720 hour(s))  Blood culture (routine x 2)     Status: Abnormal (Preliminary result)   Collection Time: 08/17/17  9:22 AM  Result Value Ref Range Status   Specimen Description   Final    BLOOD RIGHT FOREARM Performed at [facility] [address], [place], [place] [address]    Special Requests   Final    BOTTLES DRAWN AEROBIC AND ANAEROBIC Blood Culture adequate volume Performed at [facility] [address] [place], [place] [address]    Culture  Setup Time   Final    GRAM POSITIVE COCCI IN BOTH AEROBIC AND ANAEROBIC BOTTLES Organism ID to follow CRITICAL RESULT CALLED TO, READ BACK BY AND VERIFIED WITH: A MASTERS PHARMD 08/18/17 0009 JDW Performed at [facility], [place] [address] [place],  [address]    Culture STAPHYLOCOCCUS AUREUS (A)  Final   Report Status PENDING  Incomplete  Blood Culture ID Panel (Reflexed)     Status: Abnormal   Collection Time: 08/17/17  9:22 AM  Result Value Ref Range Status   Enterococcus species NOT DETECTED NOT DETECTED Final   Listeria monocytogenes NOT DETECTED NOT DETECTED Final   Staphylococcus species DETECTED (A) NOT DETECTED Final    Comment: CRITICAL RESULT CALLED TO, READ BACK BY AND VERIFIED WITH: A MASTERS PHARMD 08/18/17 0009 JDW    Staphylococcus aureus DETECTED (A) NOT DETECTED Final    Comment: Methicillin (oxacillin) susceptible Staphylococcus aureus (MSSA). Preferred therapy is anti staphylococcal beta lactam antibiotic (Cefazolin or Nafcillin), unless clinically contraindicated. CRITICAL RESULT CALLED TO, READ BACK BY AND VERIFIED WITH: A MASTERS PHARMD 08/18/17 0009 JDW    Methicillin resistance NOT DETECTED NOT DETECTED Final   Streptococcus species NOT DETECTED NOT DETECTED Final   Streptococcus agalactiae NOT  DETECTED NOT DETECTED Final   Streptococcus pneumoniae NOT DETECTED NOT DETECTED Final   Streptococcus pyogenes NOT DETECTED NOT DETECTED Final   Acinetobacter baumannii NOT DETECTED NOT DETECTED Final   Enterobacteriaceae species NOT DETECTED NOT DETECTED Final   Enterobacter cloacae complex NOT DETECTED NOT DETECTED Final   Escherichia coli NOT DETECTED NOT DETECTED Final   Klebsiella  oxytoca NOT DETECTED NOT DETECTED Final   Klebsiella pneumoniae NOT DETECTED NOT DETECTED Final   Proteus species NOT DETECTED NOT DETECTED Final   Serratia marcescens NOT DETECTED NOT DETECTED Final   Haemophilus influenzae NOT DETECTED NOT DETECTED Final   Neisseria meningitidis NOT DETECTED NOT DETECTED Final   Pseudomonas aeruginosa NOT DETECTED NOT DETECTED Final   Candida albicans NOT DETECTED NOT DETECTED Final   Candida glabrata NOT DETECTED NOT DETECTED Final   Candida krusei NOT DETECTED NOT DETECTED Final   Candida parapsilosis NOT DETECTED NOT DETECTED Final   Candida tropicalis NOT DETECTED NOT DETECTED Final    Comment: Performed at [facility], [place] [address] [place], [place] [address]  Blood culture (routine x 2)     Status: Abnormal (Preliminary result)   Collection Time: 08/17/17  9:52 AM  Result Value Ref Range Status   Specimen Description   Final    BLOOD RIGHT FOREARM Performed at [facility] [address], [place], [place] [address]    Special Requests   Final    BOTTLES DRAWN AEROBIC AND ANAEROBIC Blood Culture adequate volume Performed at [facility] [address], [place], [place] [address]    Culture  Setup Time   Final    GRAM POSITIVE COCCI IN BOTH AEROBIC AND ANAEROBIC BOTTLES CRITICAL RESULT CALLED TO, READ BACK BY AND VERIFIED WITH: A MASTERS PHARMD 08/18/17 00009 JDW Performed at [facility], [place] [address], [place], [place] [address]    Culture STAPHYLOCOCCUS AUREUS (A)  Final   Report Status PENDING   Incomplete  MRSA PCR Screening     Status: None   Collection Time: 08/17/17  8:40 PM  Result Value Ref Range Status   MRSA by PCR NEGATIVE NEGATIVE Final    Comment:        The GeneXpert MRSA Assay (FDA approved for NASAL specimens only), is one component of a comprehensive MRSA colonization surveillance program. It is not intended to diagnose MRSA infection nor to guide or monitor treatment for MRSA infections. Performed at [facility], [place] [address] [place], [place] [address]   Culture, blood (Routine X 2) w Reflex to ID Panel     Status: None (Preliminary result)   Collection Time: 08/18/17  3:05 AM  Result Value Ref Range Status   Specimen Description BLOOD RIGHT HAND  Final   Special Requests   Final    BOTTLES DRAWN AEROBIC ONLY Blood Culture adequate volume Performed at [facility], [place] [address], [place], [place] [address]    Culture PENDING  Incomplete   Report Status PENDING  Incomplete  Culture, blood (Routine X 2) w Reflex to ID Panel     Status: None (Preliminary result)   Collection Time: 08/18/17  3:05 AM  Result Value Ref Range Status   Specimen Description BLOOD RIGHT HAND  Final   Special Requests   Final    BOTTLES DRAWN AEROBIC ONLY Blood Culture adequate volume Performed at [facility], [place] [address], [place], [place] [address]    Culture PENDING  Incomplete   Report Status PENDING  Incomplete  Culture, sputum-assessment     Status: None   Collection Time: 08/18/17  9:32 PM  Result Value Ref Range Status   Specimen Description EXPECTORATED SPUTUM  Final   Special Requests NONE  Final   Sputum evaluation   Final    RESULT CALLED TO, READ BACK BY AND  VERIFIED WITH: Miguel Dibble RN [id number] 08/18/17 A BROWNING Performed at [facility], [place] [address] [place], [place] [address]    Report Status 08/18/2017 FINAL  Final    Studies/Results: Dg Shoulder Left  Result Date: 08/18/2017 CLINICAL DATA:  Left upper arm pain and bulge  in the distal humerus. Fistula inserted in 2003. EXAM: LEFT SHOULDER - 2+ VIEW COMPARISON:  None. FINDINGS: A vascular graft is seen projecting over the left shoulder along the expected location of the distal subclavian and axillary vessels. Osteoarthritis of the glenohumeral joint with joint space narrowing and subchondral sclerosis of the humeral head. AC joint is intact. The adjacent ribs and lung are nonacute. There is moderate aortic atherosclerosis at the arch. IMPRESSION: Osteoarthritis of the glenohumeral joint. No acute osseous abnormality. Overlying vascular stent is in place. Electronically Signed   By: Ashley Royalty M.D.   On: 08/18/2017 19:05   Dg Humerus Left  Result Date: 08/18/2017 CLINICAL DATA:  Left upper arm pain with small bulge along the distal humerus. EXAM: LEFT HUMERUS - 2+ VIEW COMPARISON:  None. FINDINGS: A vascular stent is seen along the volar aspect of the distal forearm which appears discontinuous for a length of 17 mm posteriorly and 21 mm along its radial aspect. A portion of the stent appears to have peeled away resulting in the gap. Volar bowing of the vascular stent is noted on the lateral view which may be contributing to the patient's small bulge noted. Subclavian and axillary stent is also seen projecting over the shoulder. Atherosclerotic calcifications are identified along the brachial artery. No acute fracture or bone destruction of the humerus. No joint dislocation. Osteoarthritis of the glenohumeral joint with inferomedial spurring off the humeral head. IMPRESSION: 1. Discontinuous vascular stent along the distal arm with volar bowing as above described. There is a small gap associated with the stent measuring 17 mm posteriorly and 21 mm along its radial aspect with peeling away of a portion of the stent believed to account for this gap. 2. Intact subclavian and axillary stent projecting over the shoulder. 3. No acute osseous abnormality. Electronically Signed   By: Ashley Royalty M.D.   On: 08/18/2017 19:10      Assessment/Plan:  INTERVAL HISTORY: pt seen by VVS and Cardiology   Principal Problem:   MSSA bacteremia Active Problems:   Atrial fibrillation [I48.91]   Pulmonary hypertension ([facility])   End-stage renal disease ([facility])   Acute respiratory failure with hypoxia ([facility])   Hypothyroidism   Sepsis ([facility])    Adam Alm. is a 57 y.o. male with  ESRD on HD at home, w ICD admitted with MSSA bacteremia and my definition ICD infection  I have concerns about potential AV fistula infection.       [facility] Antimicrobial Management Team Staphylococcus aureus bacteremia   Staphylococcus aureus bacteremia (SAB) is associated with a high rate of complications and mortality.  Specific aspects of clinical management are critical to optimizing the outcome of patients with SAB.  Therefore, the [facility] Antimicrobial Management Team [facility]) has initiated an intervention aimed at improving the management of SAB at [facility].  To do so, Infectious Diseases physicians are providing an evidence-based consult for the management of all patients with SAB.     Yes No Comments  Perform follow-up blood cultures (even if the patient is afebrile) to ensure clearance of bacteremia [x]  []   repeat cultures taken  Remove vascular catheter and obtain follow-up blood cultures  after the removal of the catheter []  []    Perform echocardiography to evaluate for endocarditis (transthoracic ECHO is 40-50% sensitive, TEE is > 90% sensitive) [x]  []  Please keep in mind, that neither test can definitively EXCLUDE endocarditis, and that should clinical suspicion remain high for endocarditis the patient should then still be treated with an "endocarditis" duration of therapy = 6 weeks he needs a transesophageal echocardiogram  Consult electrophysiologist to evaluate implanted cardiac device (pacemaker, ICD) [x]  []   his ICD needs to be removed  Ensure source control []  []  Have all  abscesses been drained effectively? Have deep seeded infections (septic joints or osteomyelitis) had appropriate surgical debridement?  Quite anxious about his AV fistula.  VVS have evaluated him and not felt that the site is overtly infected but I am bothered by his persistent fevers and new area of tenderness in AVF  Investigate for "metastatic" sites of infection [x]  []  Does the patient have ANY symptom or physical exam finding that would suggest a deeper infection (back or neck pain that may be suggestive of vertebral osteomyelitis or epidural abscess, muscle pain that could be a symptom of pyomyositis)?  Keep in mind that for deep seeded infections MRI imaging with contrast is preferred rather than other often insensitive tests such as plain x-rays, especially early in a patient's presentation.  Change antibiotic therapy to cefazolin [x]  []  Beta-lactam antibiotics are preferred for MSSA due to higher cure rates.   If on Vancomycin, goal trough should be 15 - 20 mcg/mL  Estimated duration of IV antibiotic therapy:  6 weels post ICD removal [x]  []  Consult case management for probably prolonged outpatient IV antibiotic therapy   #2 Likely superficial phlebitis: monitor site. He is supratherpeutic on warfarin    LOS: 2 days   Adam [name] 08/19/2017, 1:47 PM

## 2017-08-19 NOTE — Progress Notes (Addendum)
Progress Note  Patient Name: Adam [name]. Date of Encounter: 08/19/2017  Primary Cardiologist: Dr. Percival Spanish Device: Dr. Sallyanne Kuster  Subjective   No CP, palpitations or SOB.  In HD, watching TV comfortably  Inpatient Medications    Scheduled Meds: . ambrisentan  10 mg Oral Daily  . amiodarone  200 mg Oral QPM  . atorvastatin  40 mg Oral QPM  . calcitRIOL  0.25 mcg Oral Daily  . calcium acetate  1,334 mg Oral TID WC  . cinacalcet  90 mg Oral QPM  . levothyroxine  300 mcg Oral QAC breakfast  . midodrine  10 mg Oral 3 times per day  . phytonadione  5 mg Oral Once  . Warfarin - Pharmacist Dosing Inpatient   Does not apply q1800   Continuous Infusions: .  ceFAZolin (ANCEF) IV 1 g (08/18/17 1726)   PRN Meds: acetaminophen **OR** acetaminophen, oxyCODONE, promethazine   Vital Signs    Vitals:   08/19/17 0010 08/19/17 0442 08/19/17 0505 08/19/17 0544  BP: (!) 75/53 (!) 61/43 (!) 71/50 (!) 72/52  Pulse: 77 78    Resp: 18 18 (!) 21 (!) 21  Temp: 98.4 F (36.9 C) 98.6 F (37 C)    TempSrc: Oral Oral    SpO2: 100% 99%    Weight:  273 lb 5.9 oz (124 kg)    Height:        Intake/Output Summary (Last 24 hours) at 08/19/2017 0737 Last data filed at 08/19/2017 0513 Gross per 24 hour  Intake 480 ml  Output 1505 ml  Net -1025 ml   Filed Weights   08/18/17 0700 08/18/17 1118 08/19/17 0442  Weight: 276 lb 14.4 oz (125.6 kg) 273 lb 5.9 oz (124 kg) 273 lb 5.9 oz (124 kg)    Telemetry    SR - Personally Reviewed  ECG    No new EKGs - Personally Reviewed  Physical Exam   GEN: No acute distress.   Neck: No JVD Cardiac: RRR, 1/6 SM, rubs, or gallops.  Respiratory: CTA b/l. GI: Soft, nontender, non-distended  MS: No edema; No deformity. Neuro:  Nonfocal  Psych: Normal affect   Labs    Chemistry Recent Labs  Lab 08/17/17 0902 08/18/17 0305 08/19/17 0249  NA 133* 132* 134*  K 4.2 3.7 3.6  CL 93* 95* 94*  CO2 21* 19* 25  GLUCOSE 106* 96 96  BUN 55*  60* 33*  CREATININE 10.98* 12.74* 8.56*  CALCIUM 8.2* 7.8* 7.8*  PROT 6.8  --   --   ALBUMIN 3.4* 3.0* 2.9*  AST 24  --   --   ALT 13*  --   --   ALKPHOS 90  --   --   BILITOT 1.1  --   --   GFRNONAA 5* 4* 6*  GFRAA 5* 4* 7*  ANIONGAP 19* 18* 15     Hematology Recent Labs  Lab 08/17/17 0902 08/18/17 0305 08/19/17 0249  WBC 12.0* 11.5* 7.9  RBC 3.64* 3.37* 3.33*  HGB 10.5* 9.7* 9.5*  HCT 33.0* 30.0* 30.0*  MCV 90.7 89.0 90.1  MCH 28.8 28.8 28.5  MCHC 31.8 32.3 31.7  RDW 18.7* 18.7* 18.8*  PLT 78* 58* 60*    Cardiac EnzymesNo results for input(s): TROPONINI in the last 168 hours. No results for input(s): TROPIPOC in the last 168 hours.   BNPNo results for input(s): BNP, PROBNP in the last 168 hours.   DDimer No results for input(s): DDIMER in the last  168 hours.   Radiology    Dg Chest 2 View Result Date: 08/17/2017 CLINICAL DATA:  Fever and SOB.  Symptoms for 1 week. EXAM: CHEST - 2 VIEW COMPARISON:  11/11/2016. FINDINGS: Cardiomegaly. Dual lead pacer. Mild vascular congestion without overt edema or consolidation. No effusion or pneumothorax. Vascular stent overlies the LEFT axilla. IMPRESSION: Cardiomegaly with mild vascular congestion but no frank edema or consolidation. Similar appearance to priors. Electronically Signed   By: Staci Righter M.D.   On: 08/17/2017 09:24   Dg Shoulder Left Result Date: 08/18/2017 CLINICAL DATA:  Left upper arm pain and bulge in the distal humerus. Fistula inserted in 2003. EXAM: LEFT SHOULDER - 2+ VIEW COMPARISON:  None. FINDINGS: A vascular graft is seen projecting over the left shoulder along the expected location of the distal subclavian and axillary vessels. Osteoarthritis of the glenohumeral joint with joint space narrowing and subchondral sclerosis of the humeral head. AC joint is intact. The adjacent ribs and lung are nonacute. There is moderate aortic atherosclerosis at the arch. IMPRESSION: Osteoarthritis of the glenohumeral joint. No  acute osseous abnormality. Overlying vascular stent is in place. Electronically Signed   By: Ashley Royalty M.D.   On: 08/18/2017 19:05   Dg Humerus Left Result Date: 08/18/2017 CLINICAL DATA:  Left upper arm pain with small bulge along the distal humerus. EXAM: LEFT HUMERUS - 2+ VIEW COMPARISON:  None. FINDINGS: A vascular stent is seen along the volar aspect of the distal forearm which appears discontinuous for a length of 17 mm posteriorly and 21 mm along its radial aspect. A portion of the stent appears to have peeled away resulting in the gap. Volar bowing of the vascular stent is noted on the lateral view which may be contributing to the patient's small bulge noted. Subclavian and axillary stent is also seen projecting over the shoulder. Atherosclerotic calcifications are identified along the brachial artery. No acute fracture or bone destruction of the humerus. No joint dislocation. Osteoarthritis of the glenohumeral joint with inferomedial spurring off the humeral head. IMPRESSION: 1. Discontinuous vascular stent along the distal arm with volar bowing as above described. There is a small gap associated with the stent measuring 17 mm posteriorly and 21 mm along its radial aspect with peeling away of a portion of the stent believed to account for this gap. 2. Intact subclavian and axillary stent projecting over the shoulder. 3. No acute osseous abnormality. Electronically Signed   By: Ashley Royalty M.D.   On: 08/18/2017 19:10    Cardiac Studies   TEE scheduled for today  09/02/16: TTE  Study Conclusions - Left ventricle: The cavity size was normal. Wall thickness was increased in a pattern of mild LVH. Systolic function was normal. The estimated ejection fraction was in the range of 55% to 60%. Wall motion was normal; there were no regional wall motion abnormalities. - Aortic valve: Moderately to severely calcified annulus. Moderately thickened, moderately calcified leaflets. There  was moderate stenosis. - Mitral valve: The findings are consistent with moderate stenosis. - Left atrium: The atrium was mildly dilated. - Right ventricle: The cavity size was mildly dilated. Systolic function was mildly reduced. - Right atrium: The atrium was severely dilated. - Tricuspid valve: There was moderate-severe regurgitation. - Pulmonary arteries: Systolic pressure was moderately to severely increased. PA peak pressure: 65 mm Hg (S).     Patient Profile     57 y.o. male with a hx of NICM, ESRF on HD (hx of failed renal transplant), PAD  w/hx of toe amputation, PAFib, P.HTN, chronic hypotension on midodrine, VHD w/mod MS/AS followed by Dr. Percival Spanish with MSSA bacteremia  Assessment & Plan    1. MSSA bacteremia     ID on case, noted recommends extraction of device regardless if clear vegetation or not by TEE  Dr. Lovena Le has seen and examined the patient this morning Will plan for extraction tomorrow, though COAGS may not allow, ordered PO Vit K for this AM Will check INR later this afternoon  ICD interrogation done yesterday 0.3% VP AS/VS 99.6% One NSVT since last check only Interrogate ALL notes VT episodes in monitor zone in 2016,  One EGM available noted 39 seconds, 154bpm No treated episodes  Will d/w Dr. Lovena Le, not sure he will need life vest, or ? Re-implantation  TEE for today   2. Hx of NICM     ICD (Right sided)     Last several echos have noted improved LVEF   3. PAFib     CHA2DS2Vasc is 2 with hx of PAD and prior NICM     Out patient on warfarin, none here     INR 4.93 today, Vit K as above, needs device extraction   4. ESRF on HD     LUE AV fistula Vascular on board to evaluate for potential infection source, though notes seem to they have little suspicion of this, though working up.  5. Chronic Hypotension     On midodrine         For questions or updates, please contact [facility] Please consult [email or web address] for contact  info under Cardiology/STEMI.      Signed, Baldwin Jamaica, PA-C  08/19/2017, 7:37 AM    EP Attending  Patient seen and examined. Agree with above. The patient is improved clinically although pressure remains low and coagulopathic. He was treated with vitamin K today. If his INR is suitable, we will plan lead extraction tomorrow. ID service note reviewed. Note concerns about fistula.   Mikle Bosworth.D.

## 2017-08-20 ENCOUNTER — Inpatient Hospital Stay (HOSPITAL_COMMUNITY): Payer: Medicare HMO

## 2017-08-20 ENCOUNTER — Encounter (HOSPITAL_COMMUNITY): Payer: Self-pay

## 2017-08-20 ENCOUNTER — Inpatient Hospital Stay (HOSPITAL_COMMUNITY): Payer: Medicare HMO | Admitting: Anesthesiology

## 2017-08-20 ENCOUNTER — Encounter (HOSPITAL_COMMUNITY)
Admission: EM | Disposition: A | Discharge status: Home / Self Care | Payer: Self-pay | Source: Home / Self Care | Attending: Family Medicine

## 2017-08-20 DIAGNOSIS — T82898A Other specified complication of vascular prosthetic devices, implants and grafts, initial encounter: Secondary | ICD-10-CM

## 2017-08-20 DIAGNOSIS — R7881 Bacteremia: Secondary | ICD-10-CM

## 2017-08-20 DIAGNOSIS — T827XXA Infection and inflammatory reaction due to other cardiac and vascular devices, implants and grafts, initial encounter: Secondary | ICD-10-CM

## 2017-08-20 HISTORY — PX: ICD LEAD REMOVAL: SHX5855

## 2017-08-20 HISTORY — PX: TEE WITHOUT CARDIOVERSION: SHX5443

## 2017-08-20 LAB — CULTURE, BLOOD (ROUTINE X 2)
SPECIAL REQUESTS: ADEQUATE
Special Requests: ADEQUATE

## 2017-08-20 LAB — POCT I-STAT 7, (LYTES, BLD GAS, ICA,H+H)
Acid-Base Excess: 1 mmol/L (ref 0.0–2.0)
BICARBONATE: 26 mmol/L (ref 20.0–28.0)
Calcium, Ion: 0.97 mmol/L — ABNORMAL LOW (ref 1.15–1.40)
HCT: 34 % — ABNORMAL LOW (ref 39.0–52.0)
HEMOGLOBIN: 11.6 g/dL — AB (ref 13.0–17.0)
O2 SAT: 99 %
PCO2 ART: 41.7 mmHg (ref 32.0–48.0)
PO2 ART: 144 mmHg — AB (ref 83.0–108.0)
Potassium: 3.7 mmol/L (ref 3.5–5.1)
Sodium: 134 mmol/L — ABNORMAL LOW (ref 135–145)
TCO2: 27 mmol/L (ref 22–32)
pH, Arterial: 7.403 (ref 7.350–7.450)

## 2017-08-20 LAB — SURGICAL PCR SCREEN
MRSA, PCR: NEGATIVE
STAPHYLOCOCCUS AUREUS: POSITIVE — AB

## 2017-08-20 LAB — RENAL FUNCTION PANEL
ANION GAP: 12 (ref 5–15)
ANION GAP: 13 (ref 5–15)
Albumin: 3.1 g/dL — ABNORMAL LOW (ref 3.5–5.0)
Albumin: 3 g/dL — ABNORMAL LOW (ref 3.5–5.0)
BUN: 19 mg/dL (ref 6–20)
BUN: 26 mg/dL — ABNORMAL HIGH (ref 6–20)
CALCIUM: 7.8 mg/dL — AB (ref 8.9–10.3)
CALCIUM: 7.5 mg/dL — AB (ref 8.9–10.3)
CHLORIDE: 96 mmol/L — AB (ref 101–111)
CO2: 26 mmol/L (ref 22–32)
CO2: 24 mmol/L (ref 22–32)
CREATININE: 7.03 mg/dL — AB (ref 0.61–1.24)
Chloride: 95 mmol/L — ABNORMAL LOW (ref 101–111)
Creatinine, Ser: 6.11 mg/dL — ABNORMAL HIGH (ref 0.61–1.24)
GFR calc Af Amer: 9 mL/min — ABNORMAL LOW (ref >60)
GFR calc non Af Amer: 9 mL/min — ABNORMAL LOW (ref >60)
GFR calc non Af Amer: 8 mL/min — ABNORMAL LOW (ref >60)
GFR, EST AFRICAN AMERICAN: 11 mL/min — AB (ref >60)
GLUCOSE: 136 mg/dL — AB (ref 65–99)
Glucose, Bld: 90 mg/dL (ref 65–99)
PHOSPHORUS: 2.3 mg/dL — AB (ref 2.5–4.6)
Phosphorus: 2.9 mg/dL (ref 2.5–4.6)
Potassium: 3.8 mmol/L (ref 3.5–5.1)
Potassium: 3.6 mmol/L (ref 3.5–5.1)
SODIUM: 133 mmol/L — AB (ref 135–145)
SODIUM: 133 mmol/L — AB (ref 135–145)

## 2017-08-20 LAB — CBC
HCT: 27.3 % — ABNORMAL LOW (ref 39.0–52.0)
HEMATOCRIT: 28 % — AB (ref 39.0–52.0)
HEMOGLOBIN: 8.9 g/dL — AB (ref 13.0–17.0)
HEMOGLOBIN: 8.7 g/dL — AB (ref 13.0–17.0)
MCH: 29.3 pg (ref 26.0–34.0)
MCH: 29.1 pg (ref 26.0–34.0)
MCHC: 31.8 g/dL (ref 30.0–36.0)
MCHC: 31.9 g/dL (ref 30.0–36.0)
MCV: 92.1 fL (ref 78.0–100.0)
MCV: 91.3 fL (ref 78.0–100.0)
Platelets: 78 10*3/uL — ABNORMAL LOW (ref 150–400)
Platelets: 80 10*3/uL — ABNORMAL LOW (ref 150–400)
RBC: 3.04 MIL/uL — ABNORMAL LOW (ref 4.22–5.81)
RBC: 2.99 MIL/uL — ABNORMAL LOW (ref 4.22–5.81)
RDW: 19.1 % — ABNORMAL HIGH (ref 11.5–15.5)
RDW: 18.9 % — ABNORMAL HIGH (ref 11.5–15.5)
WBC: 7.2 10*3/uL (ref 4.0–10.5)
WBC: 6.7 10*3/uL (ref 4.0–10.5)

## 2017-08-20 LAB — PREPARE RBC (CROSSMATCH)

## 2017-08-20 LAB — PROTIME-INR
INR: 2.14
Prothrombin Time: 23.8 seconds — ABNORMAL HIGH (ref 11.4–15.2)

## 2017-08-20 SURGERY — REMOVAL, ELECTRODE LEAD, ICD
Anesthesia: General | Site: Chest | Laterality: Right

## 2017-08-20 MED ORDER — SODIUM CHLORIDE 0.9 % IV SOLN: Freq: Once | INTRAVENOUS | Status: DC

## 2017-08-20 MED ORDER — MEPERIDINE HCL 50 MG/ML IJ SOLN: 6.2500 mg | INTRAMUSCULAR | Status: DC | PRN

## 2017-08-20 MED ORDER — SODIUM CHLORIDE 0.9 % IV SOLN
INTRAVENOUS | Status: DC
  Administered 2017-08-20 (×3): via INTRAVENOUS

## 2017-08-20 MED ORDER — FENTANYL CITRATE (PF) 250 MCG/5ML IJ SOLN
INTRAMUSCULAR | Status: DC | PRN
  Administered 2017-08-20: 50 ug via INTRAVENOUS

## 2017-08-20 MED ORDER — ONDANSETRON HCL 4 MG/2ML IJ SOLN
4.0000 mg | Freq: Four times a day (QID) | INTRAMUSCULAR | Status: DC | PRN
  Administered 2017-08-24: 4 mg via INTRAVENOUS
  Filled 2017-08-20: qty 2

## 2017-08-20 MED ORDER — EPHEDRINE SULFATE-NACL 50-0.9 MG/10ML-% IV SOSY
PREFILLED_SYRINGE | INTRAVENOUS | Status: DC | PRN
  Administered 2017-08-20 (×5): 10 mg via INTRAVENOUS

## 2017-08-20 MED ORDER — LIDOCAINE-PRILOCAINE 2.5-2.5 % EX CREA
1.0000 "application " | TOPICAL_CREAM | CUTANEOUS | Status: DC | PRN
  Filled 2017-08-20: qty 5

## 2017-08-20 MED ORDER — LIDOCAINE HCL (PF) 1 % IJ SOLN: 5.0000 mL | INTRAMUSCULAR | Status: DC | PRN

## 2017-08-20 MED ORDER — CHLORHEXIDINE GLUCONATE CLOTH 2 % EX PADS
6.0000 | MEDICATED_PAD | Freq: Every day | CUTANEOUS | Status: AC
  Administered 2017-08-21 – 2017-08-24 (×2): 6 via TOPICAL

## 2017-08-20 MED ORDER — MUPIROCIN 2 % EX OINT
TOPICAL_OINTMENT | CUTANEOUS | Status: AC
  Administered 2017-08-20: 1 via NASAL
  Filled 2017-08-20: qty 22

## 2017-08-20 MED ORDER — ONDANSETRON HCL 4 MG/2ML IJ SOLN
INTRAMUSCULAR | Status: DC | PRN
  Administered 2017-08-20: 4 mg via INTRAVENOUS

## 2017-08-20 MED ORDER — PHENYLEPHRINE 40 MCG/ML (10ML) SYRINGE FOR IV PUSH (FOR BLOOD PRESSURE SUPPORT)
PREFILLED_SYRINGE | INTRAVENOUS | Status: DC | PRN
  Administered 2017-08-20: 80 ug via INTRAVENOUS
  Administered 2017-08-20: 160 ug via INTRAVENOUS
  Administered 2017-08-20 (×4): 80 ug via INTRAVENOUS

## 2017-08-20 MED ORDER — FENTANYL CITRATE (PF) 100 MCG/2ML IJ SOLN: 25.0000 ug | INTRAMUSCULAR | Status: DC | PRN

## 2017-08-20 MED ORDER — LIDOCAINE 2% (20 MG/ML) 5 ML SYRINGE
INTRAMUSCULAR | Status: DC | PRN
  Administered 2017-08-20: 50 mg via INTRAVENOUS

## 2017-08-20 MED ORDER — SODIUM CHLORIDE 0.9 % IV SOLN
0.0000 ug/min | INTRAVENOUS | Status: DC
  Administered 2017-08-20: 50 ug/min via INTRAVENOUS

## 2017-08-20 MED ORDER — ROCURONIUM BROMIDE 10 MG/ML (PF) SYRINGE
PREFILLED_SYRINGE | INTRAVENOUS | Status: DC | PRN
  Administered 2017-08-20: 30 mg via INTRAVENOUS
  Administered 2017-08-20: 50 mg via INTRAVENOUS
  Administered 2017-08-20: 30 mg via INTRAVENOUS
  Administered 2017-08-20: 20 mg via INTRAVENOUS

## 2017-08-20 MED ORDER — SODIUM CHLORIDE 0.9 % IV SOLN: 100.0000 mL | INTRAVENOUS | Status: DC | PRN

## 2017-08-20 MED ORDER — PENTAFLUOROPROP-TETRAFLUOROETH EX AERO: 1.0000 "application " | INHALATION_SPRAY | CUTANEOUS | Status: DC | PRN

## 2017-08-20 MED ORDER — DEXTROSE 5 % IV SOLN
0.0000 ug/min | INTRAVENOUS | Status: DC
  Administered 2017-08-20 (×2): 10 ug/min via INTRAVENOUS
  Administered 2017-08-20: 5 ug/min via INTRAVENOUS
  Filled 2017-08-20: qty 16

## 2017-08-20 MED ORDER — DEXAMETHASONE SODIUM PHOSPHATE 10 MG/ML IJ SOLN
INTRAMUSCULAR | Status: DC | PRN
  Administered 2017-08-20: 10 mg via INTRAVENOUS

## 2017-08-20 MED ORDER — PHENYLEPHRINE HCL 10 MG/ML IJ SOLN
INTRAVENOUS | Status: DC | PRN
  Administered 2017-08-20: 50 ug/min via INTRAVENOUS

## 2017-08-20 MED ORDER — HEPARIN SODIUM (PORCINE) 5000 UNIT/ML IJ SOLN
INTRAMUSCULAR | Status: DC | PRN
  Administered 2017-08-20: 500 mL

## 2017-08-20 MED ORDER — HEPARIN SODIUM (PORCINE) 1000 UNIT/ML DIALYSIS
1000.0000 [IU] | INTRAMUSCULAR | Status: DC | PRN
  Filled 2017-08-20: qty 1

## 2017-08-20 MED ORDER — ALBUMIN HUMAN 5 % IV SOLN
INTRAVENOUS | Status: AC
  Filled 2017-08-20: qty 250

## 2017-08-20 MED ORDER — LIDOCAINE HCL (PF) 1 % IJ SOLN
INTRAMUSCULAR | Status: AC
  Filled 2017-08-20: qty 30

## 2017-08-20 MED ORDER — LIDOCAINE HCL (PF) 1 % IJ SOLN
INTRAMUSCULAR | Status: DC | PRN
  Administered 2017-08-20: 30 mL

## 2017-08-20 MED ORDER — HEPARIN SODIUM (PORCINE) 1000 UNIT/ML DIALYSIS
20.0000 [IU]/kg | INTRAMUSCULAR | Status: DC | PRN
  Filled 2017-08-20: qty 3

## 2017-08-20 MED ORDER — ACETAMINOPHEN 325 MG PO TABS: 325.0000 mg | ORAL_TABLET | ORAL | Status: DC | PRN

## 2017-08-20 MED ORDER — MIDAZOLAM HCL 2 MG/2ML IJ SOLN
INTRAMUSCULAR | Status: AC
  Filled 2017-08-20: qty 2

## 2017-08-20 MED ORDER — LACTATED RINGERS IV SOLN: INTRAVENOUS | Status: DC

## 2017-08-20 MED ORDER — SODIUM CHLORIDE 0.9 % IV SOLN
INTRAVENOUS | Status: DC
  Administered 2017-08-20 – 2017-08-24 (×2): via INTRAVENOUS

## 2017-08-20 MED ORDER — ALTEPLASE 2 MG IJ SOLR: 2.0000 mg | Freq: Once | INTRAMUSCULAR | Status: DC | PRN

## 2017-08-20 MED ORDER — ALBUMIN HUMAN 5 % IV SOLN
12.5000 g | Freq: Once | INTRAVENOUS | Status: AC
  Administered 2017-08-20: 12.5 g via INTRAVENOUS

## 2017-08-20 MED ORDER — SODIUM CHLORIDE 0.9 % IV SOLN
INTRAVENOUS | Status: AC
  Filled 2017-08-20: qty 1.2

## 2017-08-20 MED ORDER — FENTANYL CITRATE (PF) 100 MCG/2ML IJ SOLN
INTRAMUSCULAR | Status: AC
  Filled 2017-08-20: qty 2

## 2017-08-20 MED ORDER — FENTANYL CITRATE (PF) 250 MCG/5ML IJ SOLN
INTRAMUSCULAR | Status: AC
  Filled 2017-08-20: qty 5

## 2017-08-20 MED ORDER — SUCCINYLCHOLINE CHLORIDE 200 MG/10ML IV SOSY
PREFILLED_SYRINGE | INTRAVENOUS | Status: DC | PRN
  Administered 2017-08-20: 100 mg via INTRAVENOUS

## 2017-08-20 MED ORDER — ETOMIDATE 2 MG/ML IV SOLN
INTRAVENOUS | Status: DC | PRN
  Administered 2017-08-20: 12 mg via INTRAVENOUS

## 2017-08-20 MED ORDER — MUPIROCIN 2 % EX OINT
1.0000 "application " | TOPICAL_OINTMENT | Freq: Two times a day (BID) | CUTANEOUS | Status: DC
  Administered 2017-08-20: 1 via NASAL

## 2017-08-20 MED ORDER — SODIUM CHLORIDE 0.9 % IV SOLN: 10.0000 mL/h | Freq: Once | INTRAVENOUS | Status: DC

## 2017-08-20 MED ORDER — SUGAMMADEX SODIUM 200 MG/2ML IV SOLN
INTRAVENOUS | Status: DC | PRN
  Administered 2017-08-20: 500 mg via INTRAVENOUS

## 2017-08-20 SURGICAL SUPPLY — 56 items
BAG BANDED W/RUBBER/TAPE 36X54 (MISCELLANEOUS) IMPLANT
BAG DECANTER FOR FLEXI CONT (MISCELLANEOUS) IMPLANT
BLADE 10 SAFETY STRL DISP (BLADE) ×3 IMPLANT
BLADE CLIPPER SURG (BLADE) IMPLANT
BLADE OSCILLATING /SAGITTAL (BLADE) IMPLANT
BLADE STERNUM SYSTEM 6 (BLADE) ×3 IMPLANT
BLADE SURG 10 STRL SS (BLADE) ×3 IMPLANT
BNDG COHESIVE 4X5 WHT NS (GAUZE/BANDAGES/DRESSINGS) ×3 IMPLANT
CANISTER SUCT 3000ML PPV (MISCELLANEOUS) ×3 IMPLANT
COVER BACK TABLE 60X90IN (DRAPES) ×3 IMPLANT
COVER PROBE W GEL 5X96 (DRAPES) ×6 IMPLANT
DEVICE LCKNG LEAD CARDIAC (CATHETERS) ×2 IMPLANT
DRAPE C-ARM 42X72 X-RAY (DRAPES) IMPLANT
DRAPE CARDIOVASCULAR INCISE (DRAPES) ×1
DRAPE HALF SHEET 40X57 (DRAPES) ×6 IMPLANT
DRAPE INCISE IOBAN 66X45 STRL (DRAPES) IMPLANT
DRAPE SRG 135X102X78XABS (DRAPES) ×2 IMPLANT
ELECT REM PT RETURN 9FT ADLT (ELECTROSURGICAL) ×6
ELECTRODE REM PT RTRN 9FT ADLT (ELECTROSURGICAL) ×4 IMPLANT
FELT TEFLON 1X6 (MISCELLANEOUS) IMPLANT
GAUZE SPONGE 4X4 12PLY STRL (GAUZE/BANDAGES/DRESSINGS) ×3 IMPLANT
GAUZE SPONGE 4X4 12PLY STRL LF (GAUZE/BANDAGES/DRESSINGS) ×3 IMPLANT
GAUZE SPONGE 4X4 16PLY XRAY LF (GAUZE/BANDAGES/DRESSINGS) IMPLANT
GEL ULTRASOUND 8.5O AQUASONIC (MISCELLANEOUS) ×3 IMPLANT
GLOVE BIOGEL PI IND STRL 6 (GLOVE) ×2 IMPLANT
GLOVE BIOGEL PI IND STRL 6.5 (GLOVE) ×2 IMPLANT
GLOVE BIOGEL PI IND STRL 7.5 (GLOVE) ×2 IMPLANT
GLOVE BIOGEL PI INDICATOR 6 (GLOVE) ×1
GLOVE BIOGEL PI INDICATOR 6.5 (GLOVE) ×1
GLOVE BIOGEL PI INDICATOR 7.5 (GLOVE) ×1
GLOVE ECLIPSE 8.0 STRL XLNG CF (GLOVE) ×3 IMPLANT
GOWN STRL REUS W/ TWL LRG LVL3 (GOWN DISPOSABLE) ×2 IMPLANT
GOWN STRL REUS W/ TWL XL LVL3 (GOWN DISPOSABLE) ×2 IMPLANT
GOWN STRL REUS W/TWL LRG LVL3 (GOWN DISPOSABLE) ×1
GOWN STRL REUS W/TWL XL LVL3 (GOWN DISPOSABLE) ×1
KIT TURNOVER KIT B (KITS) ×3 IMPLANT
NEEDLE PERC 18GX7CM (NEEDLE) ×3 IMPLANT
NS IRRIG 1000ML POUR BTL (IV SOLUTION) IMPLANT
PAD ARMBOARD 7.5X6 YLW CONV (MISCELLANEOUS) ×6 IMPLANT
PAD ELECT DEFIB RADIOL ZOLL (MISCELLANEOUS) ×3 IMPLANT
REMOVAL LLD CARDIAC LEAD EZ (CATHETERS) ×3
SHEATH EVOLUTION SHORTE RL 11F (SHEATH) ×3 IMPLANT
SHEATH PINNACLE 6F 10CM (SHEATH) ×6 IMPLANT
SHEATH TIGHTRAIL MECH 11F (SHEATH) ×3 IMPLANT
SHEATH TIGHTRAIL MECH 13F (SHEATH) ×3 IMPLANT
SUT PROLENE 2 0 CT2 30 (SUTURE) ×6 IMPLANT
SUT PROLENE 2 0 SH DA (SUTURE) IMPLANT
SUT VIC AB 2-0 CT2 18 VCP726D (SUTURE) IMPLANT
SUT VIC AB 3-0 X1 27 (SUTURE) IMPLANT
TAPE CLOTH SURG 4X10 WHT LF (GAUZE/BANDAGES/DRESSINGS) ×3 IMPLANT
TOWEL OR 17X24 6PK STRL BLUE (TOWEL DISPOSABLE) ×6 IMPLANT
TOWEL OR 17X26 10 PK STRL BLUE (TOWEL DISPOSABLE) ×6 IMPLANT
TRAY FOLEY W/METER SILVER 16FR (SET/KITS/TRAYS/PACK) ×3 IMPLANT
TUBE CONNECTING 12X1/4 (SUCTIONS) IMPLANT
TUBING EXTENTION W/L.L. (IV SETS) ×6 IMPLANT
YANKAUER SUCT BULB TIP NO VENT (SUCTIONS) IMPLANT

## 2017-08-20 NOTE — Plan of Care (Signed)
  Problem: Activity: Goal: Ability to tolerate increased activity will improve Outcome: Progressing   Problem: Education: Goal: Knowledge of disease and its progression will improve Outcome: Progressing   Problem: Coping: Goal: Development of coping mechanisms to deal with changes in body function or appearance will improve Outcome: Progressing

## 2017-08-20 NOTE — Progress Notes (Signed)
Pt ready for transport, awaiting help

## 2017-08-20 NOTE — Anesthesia Procedure Notes (Signed)
Procedure Name: Intubation Date/Time: 08/20/2017 4:04 PM Performed by: Teressa Lower., CRNA Pre-anesthesia Checklist: Patient identified, Emergency Drugs available, Suction available and Patient being monitored Patient Re-evaluated:Patient Re-evaluated prior to induction Oxygen Delivery Method: Circle system utilized Preoxygenation: Pre-oxygenation with 100% oxygen Induction Type: IV induction Ventilation: Mask ventilation without difficulty Laryngoscope Size: Mac and 4 Grade View: Grade I Tube type: Oral Tube size: 8.0 mm Number of attempts: 1 Airway Equipment and Method: Stylet and Oral airway Placement Confirmation: ETT inserted through vocal cords under direct vision,  positive ETCO2 and breath sounds checked- equal and bilateral Secured at: 22 cm Tube secured with: Tape Dental Injury: Teeth and Oropharynx as per pre-operative assessment

## 2017-08-20 NOTE — Progress Notes (Signed)
Reviewed labs with Dr. Lovena Le INR 2.14 this AM, no more Vit K Plts 78, ordered for 1 unit (6 doses) for on-call to OR, reviewed with blood bank and RN Basline hypotension on midiodrine AVF LUE Device R sided  Full note to follow  Tommye Standard, PA-C

## 2017-08-20 NOTE — Progress Notes (Signed)
Cardiothoracic surgery  1 hour of surgical backup time was provided for this patient while he was in the operating room having lead extraction performed by Dr. Lovena Le

## 2017-08-20 NOTE — Progress Notes (Addendum)
Progress Note  Patient Name: Adam [name]. Date of Encounter: 08/20/2017  Primary Cardiologist: Dr. Percival Spanish Device: Dr. Sallyanne Kuster  Subjective   No complaints of CP,palpitatiosn or SOB, some LUE fistula site discomfort  Inpatient Medications    Scheduled Meds: . ambrisentan  10 mg Oral Daily  . amiodarone  200 mg Oral QPM  . atorvastatin  40 mg Oral QPM  . calcitRIOL  0.25 mcg Oral Daily  . calcium acetate  1,334 mg Oral TID WC  . chlorhexidine  60 mL Topical Once  . chlorhexidine  60 mL Topical Once  . cinacalcet  90 mg Oral QPM  . gentamicin irrigation  80 mg Irrigation To SS-Surg  . levothyroxine  300 mcg Oral QAC breakfast  . midodrine  10 mg Oral 3 times per day  . sodium chloride flush  3 mL Intravenous Q12H  . Warfarin - Pharmacist Dosing Inpatient   Does not apply q1800   Continuous Infusions: . sodium chloride    . sodium chloride    . sodium chloride    . sodium chloride    .  ceFAZolin (ANCEF) IV    .  ceFAZolin (ANCEF) IV Stopped (08/19/17 2118)   PRN Meds: acetaminophen **OR** acetaminophen, oxyCODONE, promethazine, sodium chloride flush   Vital Signs    Vitals:   08/20/17 0001 08/20/17 0606 08/20/17 0835 08/20/17 1007  BP: (!) 72/50 (!) 84/47 (!) 61/32 (!) 84/61  Pulse: 87 82  72  Resp: (!) 23 (!) 26 (!) 22   Temp: 99.1 F (37.3 C) 98.6 F (37 C) 98.2 F (36.8 C)   TempSrc: Oral Oral Oral   SpO2: 93% 95% 96% 98%  Weight:  269 lb 13.5 oz (122.4 kg)    Height:        Intake/Output Summary (Last 24 hours) at 08/20/2017 1055 Last data filed at 08/19/2017 1700 Gross per 24 hour  Intake 360 ml  Output 2000 ml  Net -1640 ml   Filed Weights   08/19/17 0758 08/19/17 1212 08/20/17 0606  Weight: 273 lb 2.4 oz (123.9 kg) 268 lb 4.8 oz (121.7 kg) 269 lb 13.5 oz (122.4 kg)    Telemetry    SR - Personally Reviewed  ECG    No new EKGs - Personally Reviewed  Physical Exam   GEN: No acute distress.   Neck: No JVD Cardiac: RRR, 1-2/6  SM, rubs, or gallops.  Respiratory: CTA b/l. GI: Soft, nontender, non-distended  MS: No edema; No deformity. Neuro:  Nonfocal  Psych: Normal affect   Labs    Chemistry Recent Labs  Lab 08/17/17 0902 08/18/17 0305 08/19/17 0249 08/20/17 0354  NA 133* 132* 134* 133*  K 4.2 3.7 3.6 3.8  CL 93* 95* 94* 95*  CO2 21* 19* 25 26  GLUCOSE 106* 96 96 90  BUN 55* 60* 33* 19  CREATININE 10.98* 12.74* 8.56* 6.11*  CALCIUM 8.2* 7.8* 7.8* 7.8*  PROT 6.8  --   --   --   ALBUMIN 3.4* 3.0* 2.9* 3.1*  AST 24  --   --   --   ALT 13*  --   --   --   ALKPHOS 90  --   --   --   BILITOT 1.1  --   --   --   GFRNONAA 5* 4* 6* 9*  GFRAA 5* 4* 7* 11*  ANIONGAP 19* 18* 15 12     Hematology Recent Labs  Lab 08/18/17 0305 08/19/17 0249  08/20/17 0354  WBC 11.5* 7.9 7.2  RBC 3.37* 3.33* 3.04*  HGB 9.7* 9.5* 8.9*  HCT 30.0* 30.0* 28.0*  MCV 89.0 90.1 92.1  MCH 28.8 28.5 29.3  MCHC 32.3 31.7 31.8  RDW 18.7* 18.8* 19.1*  PLT 58* 60* 78*    Cardiac EnzymesNo results for input(s): TROPONINI in the last 168 hours. No results for input(s): TROPIPOC in the last 168 hours.   BNPNo results for input(s): BNP, PROBNP in the last 168 hours.   DDimer No results for input(s): DDIMER in the last 168 hours.   Radiology    Dg Chest 2 View Result Date: 08/17/2017 CLINICAL DATA:  Fever and SOB.  Symptoms for 1 week. EXAM: CHEST - 2 VIEW COMPARISON:  11/11/2016. FINDINGS: Cardiomegaly. Dual lead pacer. Mild vascular congestion without overt edema or consolidation. No effusion or pneumothorax. Vascular stent overlies the LEFT axilla. IMPRESSION: Cardiomegaly with mild vascular congestion but no frank edema or consolidation. Similar appearance to priors. Electronically Signed   By: Staci Righter M.D.   On: 08/17/2017 09:24   Dg Shoulder Left Result Date: 08/18/2017 CLINICAL DATA:  Left upper arm pain and bulge in the distal humerus. Fistula inserted in 2003. EXAM: LEFT SHOULDER - 2+ VIEW COMPARISON:  None.  FINDINGS: A vascular graft is seen projecting over the left shoulder along the expected location of the distal subclavian and axillary vessels. Osteoarthritis of the glenohumeral joint with joint space narrowing and subchondral sclerosis of the humeral head. AC joint is intact. The adjacent ribs and lung are nonacute. There is moderate aortic atherosclerosis at the arch. IMPRESSION: Osteoarthritis of the glenohumeral joint. No acute osseous abnormality. Overlying vascular stent is in place. Electronically Signed   By: Ashley Royalty M.D.   On: 08/18/2017 19:05   Dg Humerus Left Result Date: 08/18/2017 CLINICAL DATA:  Left upper arm pain with small bulge along the distal humerus. EXAM: LEFT HUMERUS - 2+ VIEW COMPARISON:  None. FINDINGS: A vascular stent is seen along the volar aspect of the distal forearm which appears discontinuous for a length of 17 mm posteriorly and 21 mm along its radial aspect. A portion of the stent appears to have peeled away resulting in the gap. Volar bowing of the vascular stent is noted on the lateral view which may be contributing to the patient's small bulge noted. Subclavian and axillary stent is also seen projecting over the shoulder. Atherosclerotic calcifications are identified along the brachial artery. No acute fracture or bone destruction of the humerus. No joint dislocation. Osteoarthritis of the glenohumeral joint with inferomedial spurring off the humeral head. IMPRESSION: 1. Discontinuous vascular stent along the distal arm with volar bowing as above described. There is a small gap associated with the stent measuring 17 mm posteriorly and 21 mm along its radial aspect with peeling away of a portion of the stent believed to account for this gap. 2. Intact subclavian and axillary stent projecting over the shoulder. 3. No acute osseous abnormality. Electronically Signed   By: Ashley Royalty M.D.   On: 08/18/2017 19:10    Cardiac Studies   TEE scheduled for today  09/02/16:  TTE  Study Conclusions - Left ventricle: The cavity size was normal. Wall thickness was increased in a pattern of mild LVH. Systolic function was normal. The estimated ejection fraction was in the range of 55% to 60%. Wall motion was normal; there were no regional wall motion abnormalities. - Aortic valve: Moderately to severely calcified annulus. Moderately thickened, moderately calcified leaflets.  There was moderate stenosis. - Mitral valve: The findings are consistent with moderate stenosis. - Left atrium: The atrium was mildly dilated. - Right ventricle: The cavity size was mildly dilated. Systolic function was mildly reduced. - Right atrium: The atrium was severely dilated. - Tricuspid valve: There was moderate-severe regurgitation. - Pulmonary arteries: Systolic pressure was moderately to severely increased. PA peak pressure: 65 mm Hg (S).     Patient Profile     57 y.o. male with a hx of NICM, ESRF on HD (hx of failed renal transplant), PAD w/hx of toe amputation, PAFib, P.HTN, chronic hypotension on midodrine, VHD w/mod MS/AS followed by Dr. Percival Spanish with MSSA bacteremia  Assessment & Plan    1. MSSA bacteremia     ID on case, noted recommends extraction of device regardless if clear vegetation or not by TEE   ICD interrogation done 08/18/17 0.3% VP AS/VS 99.6% One NSVT since last check only Interrogate ALL: notes VT episodes in monitor zone in 2016,  One EGM available noted 39 seconds, 154bpm No treated episodes   Plts this AM 78, transfuse platelets on call ordered  Will discuss with Dr. Lovena Le if needs life vest post extraction. Uncertain if he has had appropriate shocks, reportedly shocked by initial device historically Has had VT years ago in monitor zone     2. Hx of NICM     ICD (Right sided)     Last several echos have noted improved LVEF   3. PAFib     CHA2DS2Vasc is 2 with hx of PAD and prior NICM     Out patient on warfarin,  none here     s/p Vit K PO yesterday, INR this AM down to 2.14   4. ESRF on HD 5. Chronic anemia     LUE AV fistula     Vascular on board to evaluate for potential infection source, vascular service is following  6. Chronic Hypotension     Patient reports somewhere in the 70's-80's SB is his baseline even on non-HD days     On midodrine  7. P.HTN (severe)     On med therapy         For questions or updates, please contact [facility] Please consult [email or web address] for contact info under Cardiology/STEMI.      Signed, Baldwin Jamaica, PA-C  08/20/2017, 10:55 AM    EP Attending  Patient seen and examined. Agree with above. He is stable today. BP chronically low. His INR has improved. We will plan to proceed with extraction with plans for Life vest as an outpatient. I have reviewed the indications/risks/benefits/goals/expectations of ICD system extraction and he wishes to proceed.  Mikle Bosworth.D.

## 2017-08-20 NOTE — Anesthesia Procedure Notes (Signed)
Arterial Line Insertion Start/End4/03/2018 2:00 PM, 08/20/2017 2:05 PM Performed by: Teressa Lower., CRNA, CRNA  Patient location: Pre-op. Preanesthetic checklist: patient identified, IV checked, site marked, risks and benefits discussed, surgical consent, monitors and equipment checked, pre-op evaluation, timeout performed and anesthesia consent Lidocaine 1% used for infiltration Right, radial was placed Catheter size: 20 G Hand hygiene performed , maximum sterile barriers used  and Seldinger technique used Allen's test indicative of satisfactory collateral circulation Attempts: 2 Procedure performed without using ultrasound guided technique. Following insertion, dressing applied and Biopatch. Post procedure assessment: normal and unchanged  Patient tolerated the procedure well with no immediate complications.

## 2017-08-20 NOTE — Progress Notes (Signed)
Pharmacy Note  When patient is ready to discharge, please inform pharmacy about plans for dialysis- Continue home dialysis vs. Dialysis at a center while receiving antibiotics. This will allow for appropriate planning of antibiotic discharge therapy.    Thank you!  Jimmy Footman, PharmD, BCPS PGY2 Infectious Diseases Pharmacy Resident Pager: [phone]

## 2017-08-20 NOTE — Progress Notes (Signed)
Faxed some information to [employer] so pt can go home with one. Facesheet, H&P, and Dr. Tanna Furry most recent note sent. Received a call from [facility] stating they are still missing information. Echo results not in, and can not find the actual order for a LifeVest. Oncoming shift aware. Will try to gather remaining information for them.

## 2017-08-20 NOTE — Progress Notes (Signed)
PROGRESS NOTE    Adam Alm.   FVC:[id number]  DOB: [birth year]  DOA: 08/17/2017 PCP: [name], MD   Brief Narrative:  Adam Alm. is a 57 y/o with ESRD on home hemodialysis on Monday through Friday for the past year, atrial fibrillation on Coumadin, chronic hypertension, nonischemic cardiomyopathy with AICD, pulmonary hypertension, moderate aortic stenosis He has been feeling well up until one day prior to admission when he developed a fever (102.8) and dyspnea.  Apparently his wife stated that he had felt warm for a few days and may have been having fevers and not realizing it.  He has been doing his dialysis and his fistula has felt fine.  He had a dry cough for 1 week. He was admitted at [facility] in transfer to [facility] for ongoing dialysis.  Blood cultures became positive for MSSA. He received a fluid bolus yesterday and albumin infusion this morning (by nephrology) for hypotension. He is also on midodrine at home.  Subjective: Pt to have defibrillator removed today. He reports no new complaints currently.   Assessment & Plan:   Principal Problem:   MSSA bacteremia with sepsis (fevers leukocytosis hypotension) - In setting of AICD  - Appreciate management by ID- Currently on Ancef and Gentamycin - Plan is for removal of defibillator and vascular evaluating AV fistula (suspecting infection)  Active Problems:    Atrial fibrillation-Elevated INR - continue patient on Amiodarone - pharmacy managing coumadin  Acute on chronic thrombocytopenia -It appears that platelets have been between 101- 150 in the past - INR is elevated and not on any other anticoagulation -Continue bleeding precautions   ESRD/acute on chronic hypotension - on dialysis Monday through Friday and midodrine -Fistula is not infected-  -Being managed by nephrology  Severe pulmonary hypertension Tadalafil is being replaced with Cialis-we will need to hold this due to  hypotension -Resume ambrisentan today  Hyperlipidemia -Continue statin  Hypothyroidism Synthroid  DVT prophylaxis: INR elevated-Coumadin Code Status: Full code Family Communication: discussed with patient and family member at bedside.  Disposition Plan: Follow-up on MSSA bacteremia and hypotension- Consultants:   ID  Nephrology Procedures:    Antimicrobials:  Anti-infectives (From admission, onward)   Start     Dose/Rate Route Frequency Ordered Stop   08/20/17 1300  gentamicin (GARAMYCIN) 80 mg in sodium chloride 0.9 % 500 mL irrigation     80 mg Irrigation To [facility]Stay [facility] 08/19/17 1351 08/20/17 1511   08/20/17 1200  ceFAZolin (ANCEF) 3 g in dextrose 5 % 50 mL IVPB     3 g 130 mL/hr over 30 Minutes Intravenous To ShortStay Surgical 08/19/17 1351 08/21/17 1200   08/18/17 1800  [MAR Hold]  ceFAZolin (ANCEF) IVPB 1 g/50 mL premix     (MAR Hold since Thu 08/20/2017 at 1301. Reason: Transfer to a Procedural area.)   1 g 100 mL/hr over 30 Minutes Intravenous Every 24 hours 08/18/17 0035     08/18/17 1000  cefTRIAXone (ROCEPHIN) 1 g in sodium chloride 0.9 % 100 mL IVPB  Status:  Discontinued     1 g 200 mL/hr over 30 Minutes Intravenous Every 24 hours 08/17/17 1151 08/18/17 0035   08/18/17 0100  ceFAZolin (ANCEF) IVPB 2g/100 mL premix     2 g 200 mL/hr over 30 Minutes Intravenous  Once 08/18/17 0035 08/18/17 0127   08/17/17 1600  azithromycin (ZITHROMAX) tablet 500 mg  Status:  Discontinued     500 mg Oral Daily 08/17/17 1505 08/18/17 1053  08/17/17 1200  vancomycin (VANCOCIN) 2,000 mg in sodium chloride 0.9 % 500 mL IVPB     2,000 mg 250 mL/hr over 120 Minutes Intravenous  Once 08/17/17 1144 08/17/17 1526   08/17/17 1115  cefTRIAXone (ROCEPHIN) 1 g in sodium chloride 0.9 % 100 mL IVPB     1 g 200 mL/hr over 30 Minutes Intravenous  Once 08/17/17 1111 08/17/17 1313   08/17/17 1115  azithromycin (ZITHROMAX) tablet 500 mg     500 mg Oral  Once 08/17/17 1111 08/17/17 1139        Objective: Vitals:   08/20/17 0606 08/20/17 0835 08/20/17 1007 08/20/17 1208  BP: (!) 84/47 (!) 61/32 (!) 84/61 (!) 83/57  Pulse: 82  72 73  Resp: (!) 26 (!) 22  19  Temp: 98.6 F (37 C) 98.2 F (36.8 C)  97.6 F (36.4 C)  TempSrc: Oral Oral  Oral  SpO2: 95% 96% 98% 94%  Weight: 122.4 kg (269 lb 13.5 oz)     Height:        Intake/Output Summary (Last 24 hours) at 08/20/2017 1547 Last data filed at 08/20/2017 1200 Gross per 24 hour  Intake 360 ml  Output -  Net 360 ml   Filed Weights   08/19/17 0758 08/19/17 1212 08/20/17 0606  Weight: 123.9 kg (273 lb 2.4 oz) 121.7 kg (268 lb 4.8 oz) 122.4 kg (269 lb 13.5 oz)    Examination: General exam: Appears comfortable, in nad.  HEENT: PERRLA, oral mucosa moist, no sclera icterus or thrush Respiratory system: Clear to auscultation.  Has a dry cough respiratory effort normal. Cardiovascular system: S1 & S2 heard, RRR.   Gastrointestinal system: Abdomen soft, non-tender, nondistended. Normal bowel sound. No organomegaly Central nervous system: Alert and oriented. No focal neurological deficits. Extremities: No cyanosis, clubbing or edema Skin: No rashes or ulcers Psychiatry:  Mood & affect appropriate.   Data Reviewed: I have personally reviewed following labs and imaging studies  CBC: Recent Labs  Lab 08/17/17 0902 08/18/17 0305 08/19/17 0249 08/20/17 0354  WBC 12.0* 11.5* 7.9 7.2  NEUTROABS 11.1*  --   --   --   HGB 10.5* 9.7* 9.5* 8.9*  HCT 33.0* 30.0* 30.0* 28.0*  MCV 90.7 89.0 90.1 92.1  PLT 78* 58* 60* 78*   Basic Metabolic Panel: Recent Labs  Lab 08/17/17 0902 08/18/17 0305 08/19/17 0249 08/20/17 0354  NA 133* 132* 134* 133*  K 4.2 3.7 3.6 3.8  CL 93* 95* 94* 95*  CO2 21* 19* 25 26  GLUCOSE 106* 96 96 90  BUN 55* 60* 33* 19  CREATININE 10.98* 12.74* 8.56* 6.11*  CALCIUM 8.2* 7.8* 7.8* 7.8*  PHOS  --  5.7* 3.8 2.3*   GFR: Estimated Creatinine Clearance: 18.9 mL/min (A) (by C-G formula based on  SCr of 6.11 mg/dL (H)). Liver Function Tests: Recent Labs  Lab 08/17/17 0902 08/18/17 0305 08/19/17 0249 08/20/17 0354  AST 24  --   --   --   ALT 13*  --   --   --   ALKPHOS 90  --   --   --   BILITOT 1.1  --   --   --   PROT 6.8  --   --   --   ALBUMIN 3.4* 3.0* 2.9* 3.1*   No results for input(s): LIPASE, AMYLASE in the last 168 hours. No results for input(s): AMMONIA in the last 168 hours. Coagulation Profile: Recent Labs  Lab 08/17/17 1838 08/18/17 0305 08/19/17  0249 08/19/17 1600 08/20/17 0354  INR 3.88 4.58* 4.93* 3.34 2.14   Cardiac Enzymes: No results for input(s): CKTOTAL, CKMB, CKMBINDEX, TROPONINI in the last 168 hours. BNP (last 3 results) No results for input(s): PROBNP in the last 8760 hours. HbA1C: No results for input(s): HGBA1C in the last 72 hours. CBG: No results for input(s): GLUCAP in the last 168 hours. Lipid Profile: No results for input(s): CHOL, HDL, LDLCALC, TRIG, CHOLHDL, LDLDIRECT in the last 72 hours. Thyroid Function Tests: No results for input(s): TSH, T4TOTAL, FREET4, T3FREE, THYROIDAB in the last 72 hours. Anemia Panel: No results for input(s): VITAMINB12, FOLATE, FERRITIN, TIBC, IRON, RETICCTPCT in the last 72 hours. Urine analysis: No results found for: COLORURINE, APPEARANCEUR, LABSPEC, PHURINE, GLUCOSEU, HGBUR, BILIRUBINUR, KETONESUR, PROTEINUR, UROBILINOGEN, NITRITE, LEUKOCYTESUR Sepsis Labs: @LABRCNTIP (procalcitonin:4,lacticidven:4) ) Recent Results (from the past 240 hour(s))  Blood culture (routine x 2)     Status: Abnormal   Collection Time: 08/17/17  9:22 AM  Result Value Ref Range Status   Specimen Description   Final    BLOOD RIGHT FOREARM Performed at [facility] [address] [place], [place] [address]    Special Requests   Final    BOTTLES DRAWN AEROBIC AND ANAEROBIC Blood Culture adequate volume Performed at [facility] [address] [place], [place] [address]     Culture  Setup Time   Final    GRAM POSITIVE COCCI IN BOTH AEROBIC AND ANAEROBIC BOTTLES Organism ID to follow CRITICAL RESULT CALLED TO, READ BACK BY AND VERIFIED WITH: A MASTERS PHARMD 08/18/17 0009 JDW Performed at [facility], [place] [address] [place], [place] [address]    Culture STAPHYLOCOCCUS AUREUS (A)  Final   Report Status 08/20/2017 FINAL  Final   Organism ID, Bacteria STAPHYLOCOCCUS AUREUS  Final      Susceptibility   Staphylococcus aureus - MIC*    CIPROFLOXACIN <=0.5 SENSITIVE Sensitive     ERYTHROMYCIN >=8 RESISTANT Resistant     GENTAMICIN <=0.5 SENSITIVE Sensitive     OXACILLIN 0.5 SENSITIVE Sensitive     TETRACYCLINE <=1 SENSITIVE Sensitive     VANCOMYCIN <=0.5 SENSITIVE Sensitive     TRIMETH/SULFA <=10 SENSITIVE Sensitive     CLINDAMYCIN <=0.25 SENSITIVE Sensitive     RIFAMPIN <=0.5 SENSITIVE Sensitive     Inducible Clindamycin NEGATIVE Sensitive     * STAPHYLOCOCCUS AUREUS  Blood Culture ID Panel (Reflexed)     Status: Abnormal   Collection Time: 08/17/17  9:22 AM  Result Value Ref Range Status   Enterococcus species NOT DETECTED NOT DETECTED Final   Listeria monocytogenes NOT DETECTED NOT DETECTED Final   Staphylococcus species DETECTED (A) NOT DETECTED Final    Comment: CRITICAL RESULT CALLED TO, READ BACK BY AND VERIFIED WITH: A MASTERS PHARMD 08/18/17 0009 JDW    Staphylococcus aureus DETECTED (A) NOT DETECTED Final    Comment: Methicillin (oxacillin) susceptible Staphylococcus aureus (MSSA). Preferred therapy is anti staphylococcal beta lactam antibiotic (Cefazolin or Nafcillin), unless clinically contraindicated. CRITICAL RESULT CALLED TO, READ BACK BY AND VERIFIED WITH: A MASTERS PHARMD 08/18/17 0009 JDW    Methicillin resistance NOT DETECTED NOT DETECTED Final   Streptococcus species NOT DETECTED NOT DETECTED Final   Streptococcus agalactiae NOT DETECTED NOT DETECTED Final   Streptococcus pneumoniae NOT DETECTED NOT DETECTED Final   Streptococcus  pyogenes NOT DETECTED NOT DETECTED Final   Acinetobacter baumannii NOT DETECTED NOT DETECTED Final   Enterobacteriaceae species NOT DETECTED NOT DETECTED Final  Enterobacter cloacae complex NOT DETECTED NOT DETECTED Final   Escherichia coli NOT DETECTED NOT DETECTED Final   Klebsiella oxytoca NOT DETECTED NOT DETECTED Final   Klebsiella pneumoniae NOT DETECTED NOT DETECTED Final   Proteus species NOT DETECTED NOT DETECTED Final   Serratia marcescens NOT DETECTED NOT DETECTED Final   Haemophilus influenzae NOT DETECTED NOT DETECTED Final   Neisseria meningitidis NOT DETECTED NOT DETECTED Final   Pseudomonas aeruginosa NOT DETECTED NOT DETECTED Final   Candida albicans NOT DETECTED NOT DETECTED Final   Candida glabrata NOT DETECTED NOT DETECTED Final   Candida krusei NOT DETECTED NOT DETECTED Final   Candida parapsilosis NOT DETECTED NOT DETECTED Final   Candida tropicalis NOT DETECTED NOT DETECTED Final    Comment: Performed at [facility], [place] [address], [place], [place] [address]  Blood culture (routine x 2)     Status: Abnormal   Collection Time: 08/17/17  9:52 AM  Result Value Ref Range Status   Specimen Description   Final    BLOOD RIGHT FOREARM Performed at [facility] [address], [place], [place] [address]    Special Requests   Final    BOTTLES DRAWN AEROBIC AND ANAEROBIC Blood Culture adequate volume Performed at [facility] [address], [place], [place] [address]    Culture  Setup Time   Final    GRAM POSITIVE COCCI IN BOTH AEROBIC AND ANAEROBIC BOTTLES CRITICAL RESULT CALLED TO, READ BACK BY AND VERIFIED WITH: A MASTERS PHARMD 08/18/17 00009 JDW    Culture (A)  Final    STAPHYLOCOCCUS AUREUS SUSCEPTIBILITIES PERFORMED ON PREVIOUS CULTURE WITHIN THE LAST 5 DAYS. Performed at [facility], [place] [address] [place], [place] [address]    Report Status 08/20/2017 FINAL  Final  MRSA PCR Screening     Status: None     Collection Time: 08/17/17  8:40 PM  Result Value Ref Range Status   MRSA by PCR NEGATIVE NEGATIVE Final    Comment:        The GeneXpert MRSA Assay (FDA approved for NASAL specimens only), is one component of a comprehensive MRSA colonization surveillance program. It is not intended to diagnose MRSA infection nor to guide or monitor treatment for MRSA infections. Performed at [facility], [place] [address] [place], [place] [address]   Culture, blood (Routine X 2) w Reflex to ID Panel     Status: None (Preliminary result)   Collection Time: 08/18/17  3:05 AM  Result Value Ref Range Status   Specimen Description BLOOD RIGHT HAND  Final   Special Requests   Final    BOTTLES DRAWN AEROBIC ONLY Blood Culture adequate volume   Culture   Final    NO GROWTH 2 DAYS Performed at [facility], [place] [address], [place], [place] [address]    Report Status PENDING  Incomplete  Culture, blood (Routine X 2) w Reflex to ID Panel     Status: None (Preliminary result)   Collection Time: 08/18/17  3:05 AM  Result Value Ref Range Status   Specimen Description BLOOD RIGHT HAND  Final   Special Requests   Final    BOTTLES DRAWN AEROBIC ONLY Blood Culture adequate volume   Culture   Final    NO GROWTH 2 DAYS Performed at [facility], [place] [address], [place],  [address]    Report Status PENDING  Incomplete  Culture, sputum-assessment  Status: None   Collection Time: 08/18/17  9:32 PM  Result Value Ref Range Status   Specimen Description EXPECTORATED SPUTUM  Final   Special Requests NONE  Final   Sputum evaluation   Final    RESULT CALLED TO, READ BACK BY AND VERIFIED WITH: Miguel Dibble RN [id number] 08/18/17 A BROWNING Performed at [facility], [place] [address] [place], [place] [address]    Report Status 08/18/2017 FINAL  Final  Surgical PCR screen     Status: Abnormal   Collection Time: 08/20/17 10:11 AM  Result Value Ref Range Status   MRSA, PCR NEGATIVE NEGATIVE  Final   Staphylococcus aureus POSITIVE (A) NEGATIVE Final    Comment: (NOTE) The Xpert SA Assay (FDA approved for NASAL specimens in patients 40 years of age and older), is one component of a comprehensive surveillance program. It is not intended to diagnose infection nor to guide or monitor treatment. Performed at [facility], [place] [address] [place], [name] [address]          Radiology Studies: Dg Shoulder Left  Result Date: 08/18/2017 CLINICAL DATA:  Left upper arm pain and bulge in the distal humerus. Fistula inserted in 2003. EXAM: LEFT SHOULDER - 2+ VIEW COMPARISON:  None. FINDINGS: A vascular graft is seen projecting over the left shoulder along the expected location of the distal subclavian and axillary vessels. Osteoarthritis of the glenohumeral joint with joint space narrowing and subchondral sclerosis of the humeral head. AC joint is intact. The adjacent ribs and lung are nonacute. There is moderate aortic atherosclerosis at the arch. IMPRESSION: Osteoarthritis of the glenohumeral joint. No acute osseous abnormality. Overlying vascular stent is in place. Electronically Signed   By: Ashley Royalty M.D.   On: 08/18/2017 19:05   Dg Humerus Left  Result Date: 08/18/2017 CLINICAL DATA:  Left upper arm pain with small bulge along the distal humerus. EXAM: LEFT HUMERUS - 2+ VIEW COMPARISON:  None. FINDINGS: A vascular stent is seen along the volar aspect of the distal forearm which appears discontinuous for a length of 17 mm posteriorly and 21 mm along its radial aspect. A portion of the stent appears to have peeled away resulting in the gap. Volar bowing of the vascular stent is noted on the lateral view which may be contributing to the patient's small bulge noted. Subclavian and axillary stent is also seen projecting over the shoulder. Atherosclerotic calcifications are identified along the brachial artery. No acute fracture or bone destruction of the humerus. No joint dislocation.  Osteoarthritis of the glenohumeral joint with inferomedial spurring off the humeral head. IMPRESSION: 1. Discontinuous vascular stent along the distal arm with volar bowing as above described. There is a small gap associated with the stent measuring 17 mm posteriorly and 21 mm along its radial aspect with peeling away of a portion of the stent believed to account for this gap. 2. Intact subclavian and axillary stent projecting over the shoulder. 3. No acute osseous abnormality. Electronically Signed   By: Ashley Royalty M.D.   On: 08/18/2017 19:10      Scheduled Meds: . [MAR Hold] ambrisentan  10 mg Oral Daily  . [MAR Hold] amiodarone  200 mg Oral QPM  . [MAR Hold] atorvastatin  40 mg Oral QPM  . [MAR Hold] calcitRIOL  0.25 mcg Oral Daily  . [MAR Hold] calcium acetate  1,334 mg Oral TID WC  . chlorhexidine  60 mL Topical Once  . chlorhexidine  60 mL Topical  Once  . Chlorhexidine Gluconate Cloth  6 each Topical Daily  . [MAR Hold] cinacalcet  90 mg Oral QPM  . [MAR Hold] levothyroxine  300 mcg Oral QAC breakfast  . [MAR Hold] midodrine  10 mg Oral 3 times per day  . mupirocin ointment  1 application Nasal BID  . sodium chloride flush  3 mL Intravenous Q12H  . [MAR Hold] Warfarin - Pharmacist Dosing Inpatient   Does not apply q1800   Continuous Infusions: . sodium chloride    . sodium chloride    . sodium chloride    . [MAR Hold] sodium chloride    . sodium chloride 10 mL/hr at 08/20/17 1318  . sodium chloride    .  ceFAZolin (ANCEF) IV    . [MAR Hold]  ceFAZolin (ANCEF) IV Stopped (08/19/17 2118)  . norepinephrine (LEVOPHED) Adult infusion       LOS: 3 days    Time spent in minutes: [place], MD [facility] Pager:[phone] [email or web address] Password [facility] 08/20/2017, 3:47 PM

## 2017-08-20 NOTE — Progress Notes (Signed)
[facility] for Warfarin Indication: atrial fibrillation  Allergies  Allergen Reactions  . Enalaprilat Swelling    Mouth swelling  . Vasotec [Enalapril] Swelling    MOUTH SWELLING  . Ivp Dye [Iodinated Diagnostic Agents] Nausea And Vomiting    Patient Measurements: Height: 6\' 1"  (185.4 cm) Weight: 269 lb 13.5 oz (122.4 kg) IBW/kg (Calculated) : 79.9   Vital Signs: Temp: 98.2 F (36.8 C) (04/11 0835) Temp Source: Oral (04/11 0835) BP: 61/32 (04/11 0835) Pulse Rate: 82 (04/11 0606)  Labs: Recent Labs    08/18/17 0305 08/19/17 0249 08/19/17 1600 08/20/17 0354  HGB 9.7* 9.5*  --  8.9*  HCT 30.0* 30.0*  --  28.0*  PLT 58* 60*  --  78*  LABPROT 43.0* 45.6* 33.6* 23.8*  INR 4.58* 4.93* 3.34 2.14  CREATININE 12.74* 8.56*  --  6.11*    Estimated Creatinine Clearance: 18.9 mL/min (A) (by C-G formula based on SCr of 6.11 mg/dL (H)).  Assessment: 67 y/oM with PMH of ESRD, hypothyroidism, pulmonary hypertension, AS, MS, and paroxysmal atrial fibrillation on chronic warfarin at home admitted for sepsis from MSSA bacteremia. Pharmacy consulted to manage warfarin therapy while patient admitted. Noted plans for ICD removal and possible AVF graft procedure -INR remains supratherapeutic and increased, INR 3.88 >>2.14. No bleeding noted, Hgb 8.9, platelets are low at 78 (100-120s normally).  PTA dose reported as 9mg  daily except 6mg  on Sundays   Goal of Therapy:  INR 2-3 Monitor platelets by anticoagulation protocol: Yes   Plan:  Hold warfarin for procedures Daily INR  Hildred Laser, PharmD Clinical Pharmacist Clinical phone from 8:30-4:00 is [phone] After 4pm, please call Main Rx [phone]) for assistance. 08/20/2017 9:00 AM

## 2017-08-20 NOTE — Progress Notes (Signed)
Subjective: Worsening swelling at AVF site with pain "this has never done this before"  Antibiotics:  Anti-infectives (From admission, onward)   Start     Dose/Rate Route Frequency Ordered Stop   08/20/17 1300  gentamicin (GARAMYCIN) 80 mg in sodium chloride 0.9 % 500 mL irrigation     80 mg Irrigation To [facility]Stay [facility] 08/19/17 1351 08/21/17 1300   08/20/17 1200  ceFAZolin (ANCEF) 3 g in dextrose 5 % 50 mL IVPB     3 g 130 mL/hr over 30 Minutes Intravenous To ShortStay Surgical 08/19/17 1351 08/21/17 1200   08/18/17 1800  ceFAZolin (ANCEF) IVPB 1 g/50 mL premix     1 g 100 mL/hr over 30 Minutes Intravenous Every 24 hours 08/18/17 0035     08/18/17 1000  cefTRIAXone (ROCEPHIN) 1 g in sodium chloride 0.9 % 100 mL IVPB  Status:  Discontinued     1 g 200 mL/hr over 30 Minutes Intravenous Every 24 hours 08/17/17 1151 08/18/17 0035   08/18/17 0100  ceFAZolin (ANCEF) IVPB 2g/100 mL premix     2 g 200 mL/hr over 30 Minutes Intravenous  Once 08/18/17 0035 08/18/17 0127   08/17/17 1600  azithromycin (ZITHROMAX) tablet 500 mg  Status:  Discontinued     500 mg Oral Daily 08/17/17 1505 08/18/17 1053   08/17/17 1200  vancomycin (VANCOCIN) 2,000 mg in sodium chloride 0.9 % 500 mL IVPB     2,000 mg 250 mL/hr over 120 Minutes Intravenous  Once 08/17/17 1144 08/17/17 1526   08/17/17 1115  cefTRIAXone (ROCEPHIN) 1 g in sodium chloride 0.9 % 100 mL IVPB     1 g 200 mL/hr over 30 Minutes Intravenous  Once 08/17/17 1111 08/17/17 1313   08/17/17 1115  azithromycin (ZITHROMAX) tablet 500 mg     500 mg Oral  Once 08/17/17 1111 08/17/17 1139      Medications: Scheduled Meds: . ambrisentan  10 mg Oral Daily  . amiodarone  200 mg Oral QPM  . atorvastatin  40 mg Oral QPM  . calcitRIOL  0.25 mcg Oral Daily  . calcium acetate  1,334 mg Oral TID WC  . chlorhexidine  60 mL Topical Once  . chlorhexidine  60 mL Topical Once  . cinacalcet  90 mg Oral QPM  . gentamicin irrigation  80 mg  Irrigation To SS-Surg  . levothyroxine  300 mcg Oral QAC breakfast  . midodrine  10 mg Oral 3 times per day  . sodium chloride flush  3 mL Intravenous Q12H  . Warfarin - Pharmacist Dosing Inpatient   Does not apply q1800   Continuous Infusions: . sodium chloride    . sodium chloride    . sodium chloride    . sodium chloride    .  ceFAZolin (ANCEF) IV    .  ceFAZolin (ANCEF) IV Stopped (08/19/17 2118)   PRN Meds:.acetaminophen **OR** acetaminophen, oxyCODONE, promethazine, sodium chloride flush    Objective: Weight change: -3.5 oz (-0.1 kg)  Intake/Output Summary (Last 24 hours) at 08/20/2017 1106 Last data filed at 08/19/2017 1700 Gross per 24 hour  Intake 360 ml  Output 2000 ml  Net -1640 ml   Blood pressure (!) 84/61, pulse 72, temperature 98.2 F (36.8 C), temperature source Oral, resp. rate (!) 22, height 6\' 1"  (1.854 m), weight 269 lb 13.5 oz (122.4 kg), SpO2 98 %. Temp:  [97.5 F (36.4 C)-99.9 F (37.7 C)] 98.2 F (36.8 C) (04/11 0835) Pulse Rate:  [  63-94] 72 (04/11 1007) Resp:  [12-29] 22 (04/11 0835) BP: (61-86)/(32-61) 84/61 (04/11 1007) SpO2:  [93 %-100 %] 98 % (04/11 1007) Weight:  [268 lb 4.8 oz (121.7 kg)-269 lb 13.5 oz (122.4 kg)] 269 lb 13.5 oz (122.4 kg) (04/11 0606)  Physical Exam: General: Alert and awake, oriented x3, not in any acute distress. HEENT: anicteric sclera,EOMI CVS regular rate, normal r,  no murmur rubs or gallops, ICD site clean Chest: clear to auscultation bilaterally, no wheezing, rales or rhonchi Abdomen: soft nontender, nondistended, normal bowel sounds, Extremities: skin  --he may have beginnings of superficial thrombus on right PIV site  AVF site has an area of edema and tenderness  08/20/2017:      Neuro: nonfocal  CBC:  CBC Latest Ref Rng & Units 08/20/2017 08/19/2017 08/18/2017  WBC 4.0 - 10.5 K/uL 7.2 7.9 11.5(H)  Hemoglobin 13.0 - 17.0 g/dL 8.9(L) 9.5(L) 9.7(L)  Hematocrit 39.0 - 52.0 % 28.0(L) 30.0(L) 30.0(L)    Platelets 150 - 400 K/uL 78(L) 60(L) 58(L)     BMET Recent Labs    08/19/17 0249 08/20/17 0354  NA 134* 133*  K 3.6 3.8  CL 94* 95*  CO2 25 26  GLUCOSE 96 90  BUN 33* 19  CREATININE 8.56* 6.11*  CALCIUM 7.8* 7.8*     Liver Panel  Recent Labs    08/19/17 0249 08/20/17 0354  ALBUMIN 2.9* 3.1*       Sedimentation Rate No results for input(s): ESRSEDRATE in the last 72 hours. C-Reactive Protein No results for input(s): CRP in the last 72 hours.  Micro Results: Recent Results (from the past 720 hour(s))  Blood culture (routine x 2)     Status: Abnormal   Collection Time: 08/17/17  9:22 AM  Result Value Ref Range Status   Specimen Description   Final    BLOOD RIGHT FOREARM Performed at [facility] [address] [place], [place] [address]    Special Requests   Final    BOTTLES DRAWN AEROBIC AND ANAEROBIC Blood Culture adequate volume Performed at [facility] [address] [place], [place] [address]    Culture  Setup Time   Final    GRAM POSITIVE COCCI IN BOTH AEROBIC AND ANAEROBIC BOTTLES Organism ID to follow CRITICAL RESULT CALLED TO, READ BACK BY AND VERIFIED WITH: A MASTERS PHARMD 08/18/17 0009 JDW Performed at [facility], [place] [address], [place], [place] [address]    Culture STAPHYLOCOCCUS AUREUS (A)  Final   Report Status 08/20/2017 FINAL  Final   Organism ID, Bacteria STAPHYLOCOCCUS AUREUS  Final      Susceptibility   Staphylococcus aureus - MIC*    CIPROFLOXACIN <=0.5 SENSITIVE Sensitive     ERYTHROMYCIN >=8 RESISTANT Resistant     GENTAMICIN <=0.5 SENSITIVE Sensitive     OXACILLIN 0.5 SENSITIVE Sensitive     TETRACYCLINE <=1 SENSITIVE Sensitive     VANCOMYCIN <=0.5 SENSITIVE Sensitive     TRIMETH/SULFA <=10 SENSITIVE Sensitive     CLINDAMYCIN <=0.25 SENSITIVE Sensitive     RIFAMPIN <=0.5 SENSITIVE Sensitive     Inducible Clindamycin NEGATIVE Sensitive     * STAPHYLOCOCCUS AUREUS  Blood  Culture ID Panel (Reflexed)     Status: Abnormal   Collection Time: 08/17/17  9:22 AM  Result Value Ref Range Status   Enterococcus species NOT DETECTED NOT DETECTED Final   Listeria monocytogenes NOT DETECTED NOT DETECTED Final   Staphylococcus species DETECTED (A) NOT DETECTED Final  Comment: CRITICAL RESULT CALLED TO, READ BACK BY AND VERIFIED WITH: A MASTERS PHARMD 08/18/17 0009 JDW    Staphylococcus aureus DETECTED (A) NOT DETECTED Final    Comment: Methicillin (oxacillin) susceptible Staphylococcus aureus (MSSA). Preferred therapy is anti staphylococcal beta lactam antibiotic (Cefazolin or Nafcillin), unless clinically contraindicated. CRITICAL RESULT CALLED TO, READ BACK BY AND VERIFIED WITH: A MASTERS PHARMD 08/18/17 0009 JDW    Methicillin resistance NOT DETECTED NOT DETECTED Final   Streptococcus species NOT DETECTED NOT DETECTED Final   Streptococcus agalactiae NOT DETECTED NOT DETECTED Final   Streptococcus pneumoniae NOT DETECTED NOT DETECTED Final   Streptococcus pyogenes NOT DETECTED NOT DETECTED Final   Acinetobacter baumannii NOT DETECTED NOT DETECTED Final   Enterobacteriaceae species NOT DETECTED NOT DETECTED Final   Enterobacter cloacae complex NOT DETECTED NOT DETECTED Final   Escherichia coli NOT DETECTED NOT DETECTED Final   Klebsiella oxytoca NOT DETECTED NOT DETECTED Final   Klebsiella pneumoniae NOT DETECTED NOT DETECTED Final   Proteus species NOT DETECTED NOT DETECTED Final   Serratia marcescens NOT DETECTED NOT DETECTED Final   Haemophilus influenzae NOT DETECTED NOT DETECTED Final   Neisseria meningitidis NOT DETECTED NOT DETECTED Final   Pseudomonas aeruginosa NOT DETECTED NOT DETECTED Final   Candida albicans NOT DETECTED NOT DETECTED Final   Candida glabrata NOT DETECTED NOT DETECTED Final   Candida krusei NOT DETECTED NOT DETECTED Final   Candida parapsilosis NOT DETECTED NOT DETECTED Final   Candida tropicalis NOT DETECTED NOT DETECTED Final     Comment: Performed at [facility], [place]. [address], [place], [place] [address]  Blood culture (routine x 2)     Status: Abnormal   Collection Time: 08/17/17  9:52 AM  Result Value Ref Range Status   Specimen Description   Final    BLOOD RIGHT FOREARM Performed at [facility] [address] [place], [place] [address]    Special Requests   Final    BOTTLES DRAWN AEROBIC AND ANAEROBIC Blood Culture adequate volume Performed at [facility] [address] [place], [place] [address]    Culture  Setup Time   Final    GRAM POSITIVE COCCI IN BOTH AEROBIC AND ANAEROBIC BOTTLES CRITICAL RESULT CALLED TO, READ BACK BY AND VERIFIED WITH: A MASTERS PHARMD 08/18/17 00009 JDW    Culture (A)  Final    STAPHYLOCOCCUS AUREUS SUSCEPTIBILITIES PERFORMED ON PREVIOUS CULTURE WITHIN THE LAST 5 DAYS. Performed at [facility], [place] [address] [place], [place] [address]    Report Status 08/20/2017 FINAL  Final  MRSA PCR Screening     Status: None   Collection Time: 08/17/17  8:40 PM  Result Value Ref Range Status   MRSA by PCR NEGATIVE NEGATIVE Final    Comment:        The GeneXpert MRSA Assay (FDA approved for NASAL specimens only), is one component of a comprehensive MRSA colonization surveillance program. It is not intended to diagnose MRSA infection nor to guide or monitor treatment for MRSA infections. Performed at [facility], [place] [address] [place], [place] [address]   Culture, blood (Routine X 2) w Reflex to ID Panel     Status: None (Preliminary result)   Collection Time: 08/18/17  3:05 AM  Result Value Ref Range Status   Specimen Description BLOOD RIGHT HAND  Final   Special Requests   Final    BOTTLES DRAWN AEROBIC ONLY Blood Culture adequate volume   Culture  Final    NO GROWTH 1 DAY Performed at [facility], [place] [address] [place], [place] [address]    Report Status PENDING  Incomplete  Culture, blood (Routine  X 2) w Reflex to ID Panel     Status: None (Preliminary result)   Collection Time: 08/18/17  3:05 AM  Result Value Ref Range Status   Specimen Description BLOOD RIGHT HAND  Final   Special Requests   Final    BOTTLES DRAWN AEROBIC ONLY Blood Culture adequate volume   Culture   Final    NO GROWTH 1 DAY Performed at [facility], [place] [address], [place], [place] [address]    Report Status PENDING  Incomplete  Culture, sputum-assessment     Status: None   Collection Time: 08/18/17  9:32 PM  Result Value Ref Range Status   Specimen Description EXPECTORATED SPUTUM  Final   Special Requests NONE  Final   Sputum evaluation   Final    RESULT CALLED TO, READ BACK BY AND VERIFIED WITH: Miguel Dibble RN [id number] 08/18/17 A [place] Performed at [facility], [place] [address] [place], [place] [address]    Report Status 08/18/2017 FINAL  Final    Studies/Results: Dg Shoulder Left  Result Date: 08/18/2017 CLINICAL DATA:  Left upper arm pain and bulge in the distal humerus. Fistula inserted in 2003. EXAM: LEFT SHOULDER - 2+ VIEW COMPARISON:  None. FINDINGS: A vascular graft is seen projecting over the left shoulder along the expected location of the distal subclavian and axillary vessels. Osteoarthritis of the glenohumeral joint with joint space narrowing and subchondral sclerosis of the humeral head. AC joint is intact. The adjacent ribs and lung are nonacute. There is moderate aortic atherosclerosis at the arch. IMPRESSION: Osteoarthritis of the glenohumeral joint. No acute osseous abnormality. Overlying vascular stent is in place. Electronically Signed   By: Ashley Royalty M.D.   On: 08/18/2017 19:05   Dg Humerus Left  Result Date: 08/18/2017 CLINICAL DATA:  Left upper arm pain with small bulge along the distal humerus. EXAM: LEFT HUMERUS - 2+ VIEW COMPARISON:  None. FINDINGS: A vascular stent is seen along the volar aspect of the distal forearm which appears discontinuous for a length of 17 mm  posteriorly and 21 mm along its radial aspect. A portion of the stent appears to have peeled away resulting in the gap. Volar bowing of the vascular stent is noted on the lateral view which may be contributing to the patient's small bulge noted. Subclavian and axillary stent is also seen projecting over the shoulder. Atherosclerotic calcifications are identified along the brachial artery. No acute fracture or bone destruction of the humerus. No joint dislocation. Osteoarthritis of the glenohumeral joint with inferomedial spurring off the humeral head. IMPRESSION: 1. Discontinuous vascular stent along the distal arm with volar bowing as above described. There is a small gap associated with the stent measuring 17 mm posteriorly and 21 mm along its radial aspect with peeling away of a portion of the stent believed to account for this gap. 2. Intact subclavian and axillary stent projecting over the shoulder. 3. No acute osseous abnormality. Electronically Signed   By: Ashley Royalty M.D.   On: 08/18/2017 19:10      Assessment/Plan:  INTERVAL HISTORY: US showed mixed echos around anastamosis with "hematoma"      Principal Problem:   MSSA bacteremia Active Problems:   Atrial fibrillation [I48.91]   Pulmonary hypertension (H[facility])   End-stage renal  disease ([place])   Acute respiratory failure with hypoxia ([place])   Hypothyroidism   Sepsis ([place])    Adam [name]. is a 57 y.o. male with  ESRD on HD at home, w ICD admitted with MSSA bacteremia and my definition ICD infection  I have concerns about potential AV fistula infection.  Yes there could be hematoma but this could be infected.  I worry that IF we go through trouble of removing his ICD and the fistula remains infected we will risk RE-SEEDING a NEW ICD when that gets placed  Obviously this is NOT a TRIVIAL surgery  I greatly appreciate Dr. Abby Potash and Dr. Lovena Le and Dr. Verita Lamb help here       [facility] Antimicrobial Management  Team Staphylococcus aureus bacteremia   Staphylococcus aureus bacteremia (SAB) is associated with a high rate of complications and mortality.  Specific aspects of clinical management are critical to optimizing the outcome of patients with SAB.  Therefore, the [facility] Antimicrobial Management Team [facility]) has initiated an intervention aimed at improving the management of SAB at [facility].  To do so, Infectious Diseases physicians are providing an evidence-based consult for the management of all patients with SAB.     Yes No Comments  Perform follow-up blood cultures (even if the patient is afebrile) to ensure clearance of bacteremia [x]  []   repeat cultures taken 08/18/17 no growth  Remove vascular catheter and obtain follow-up blood cultures after the removal of the catheter []  []    Perform echocardiography to evaluate for endocarditis (transthoracic ECHO is 40-50% sensitive, TEE is > 90% sensitive) [x]  []  Please keep in mind, that neither test can definitively EXCLUDE endocarditis, and that should clinical suspicion remain high for endocarditis the patient should then still be treated with an "endocarditis" duration of therapy = 6 weeks he needs a transesophageal echocardiogram  Consult electrophysiologist to evaluate implanted cardiac device (pacemaker, ICD) [x]  []   his ICD needs to be removed  Ensure source control []  []  Have all abscesses been drained effectively? Have deep seeded infections (septic joints or osteomyelitis) had appropriate surgical debridement?  See Above discussion I am very worried that the fistula is infected and will need to be removed  Investigate for "metastatic" sites of infection [x]  []  Does the patient have ANY symptom or physical exam finding that would suggest a deeper infection (back or neck pain that may be suggestive of vertebral osteomyelitis or epidural abscess, muscle pain that could be a symptom of pyomyositis)?  Keep in mind that for deep seeded infections  MRI imaging with contrast is preferred rather than other often insensitive tests such as plain x-rays, especially early in a patient's presentation.  Change antibiotic therapy to cefazolin [x]  []  Beta-lactam antibiotics are preferred for MSSA due to higher cure rates.   If on Vancomycin, goal trough should be 15 - 20 mcg/mL  Estimated duration of IV antibiotic therapy:  6 weels post ICD removal or 6 weeks post AV fistual removal depending upon what happens and when [x]  []  Consult case management for probably prolonged outpatient IV antibiotic therapy       LOS: 3 days   Alcide Evener 08/20/2017, 11:06 AM

## 2017-08-20 NOTE — Progress Notes (Signed)
Dr Lovena Le here to see pt, aware pt is on NEO drip and receiving IV Albumin. He says pt's  SBP 70's-80 is normal for him and he is fine with that.  Pt is awake, alert, mentating clearly, no c/o. Will continue to monitor and wean NEO.

## 2017-08-20 NOTE — Progress Notes (Signed)
Pt arrived to PACU with BP of 69/47. [place] initiated neo drip at 77mcg. Dr Ola Spurr at bedside. 280ml of albumin given per his verbal order. Goal of SBP 75-80 per Dr Ola Spurr. Pt states during dialysis it is not uncommon for his BP to be in the 60's.

## 2017-08-20 NOTE — Anesthesia Preprocedure Evaluation (Addendum)
Anesthesia Evaluation  Patient identified by MRN, date of birth, ID band Patient awake    Reviewed: Allergy & Precautions, NPO status , Patient's Chart, lab work & pertinent test results  History of Anesthesia Complications (+) PONV and history of anesthetic complications  Airway Mallampati: I  TM Distance: >3 FB Neck ROM: Full    Dental  (+) Edentulous Upper, Dental Advisory Given   Pulmonary pneumonia,    breath sounds clear to auscultation       Cardiovascular + Peripheral Vascular Disease  + dysrhythmias Atrial Fibrillation + Cardiac Defibrillator + Valvular Problems/Murmurs AS  Rhythm:Regular Rate:Normal     Neuro/Psych    GI/Hepatic   Endo/Other  Hypothyroidism   Renal/GU ESRF and DialysisRenal disease     Musculoskeletal  (+) Arthritis ,   Abdominal (+) + obese,   Peds  Hematology negative hematology ROS (+)   Anesthesia Other Findings   Reproductive/Obstetrics                           Lab Results  Component Value Date   WBC 7.2 08/20/2017   HGB 8.9 (L) 08/20/2017   HCT 28.0 (L) 08/20/2017   MCV 92.1 08/20/2017   PLT 78 (L) 08/20/2017   Lab Results  Component Value Date   CREATININE 6.11 (H) 08/20/2017   BUN 19 08/20/2017   NA 133 (L) 08/20/2017   K 3.8 08/20/2017   CL 95 (L) 08/20/2017   CO2 26 08/20/2017   Lab Results  Component Value Date   INR 2.14 08/20/2017   INR 3.34 08/19/2017   INR 4.93 (HH) 08/19/2017   Echo: - Left ventricle: The cavity size was normal. Wall thickness was   increased in a pattern of mild LVH. Systolic function was normal.   The estimated ejection fraction was in the range of 60% to 65%.   Wall motion was normal; there were no regional wall motion   abnormalities. Doppler parameters are consistent with abnormal   left ventricular relaxation (grade 1 diastolic dysfunction). The   E/e&' ratio is between 8-15, suggesting indeterminate LV  filling   pressure. - Ventricular septum: Septal motion showed abnormal function and   dyssynergy. The contour showed diastolic flattening and systolic   flattening. - Aortic valve: Moderate calcified aortic stenosis. Mean gradient   (S): 25 mm Hg. Peak gradient (S): 40 mm Hg. Valve area (VTI):   1.15 cm^2. Valve area (Vmax): 1.11 cm^2. Valve area (Vmean): 1.16   cm^2. - Mitral valve: Calcified annulus. Mildly thickened leaflets . Mild   to moderate stenosis. Mean gradient (D): 8 mm Hg. Valve area by   continuity equation (using LVOT flow): 1.25 cm^2. - Left atrium: The atrium was mildly dilated. - Right ventricle: The cavity size was moderately dilated. - Right atrium: Severely dilated. - Tricuspid valve: There was moderate regurgitation. - Pulmonary arteries: PA peak pressure: 59 mm Hg (S). - Inferior vena cava: The vessel was dilated. The respirophasic   diameter changes were blunted (< 50%), consistent with elevated   central venous pressure.  Anesthesia Physical Anesthesia Plan  ASA: IV  Anesthesia Plan: General   Post-op Pain Management:    Induction: Intravenous  PONV Risk Score and Plan: 4 or greater and Ondansetron, Dexamethasone, Midazolam and Treatment may vary due to age or medical condition  Airway Management Planned: Oral ETT  Additional Equipment: Arterial line, CVP, TEE and Ultrasound Guidance Line Placement  Intra-op Plan:   Post-operative Plan:  Possible Post-op intubation/ventilation  Informed Consent: I have reviewed the patients History and Physical, chart, labs and discussed the procedure including the risks, benefits and alternatives for the proposed anesthesia with the patient or authorized representative who has indicated his/her understanding and acceptance.   Dental advisory given  Plan Discussed with: CRNA  Anesthesia Plan Comments:        Anesthesia Quick Evaluation

## 2017-08-20 NOTE — Transfer of Care (Signed)
Immediate Anesthesia Transfer of Care Note  Patient: Adam [name].  Procedure(s) Performed: ICD EXTRACTION WITH C-ARM (Right Chest) TRANSESOPHAGEAL ECHOCARDIOGRAM (TEE) (N/A )  Patient Location: PACU  Anesthesia Type:General  Level of Consciousness: awake, alert  and oriented  Airway & Oxygen Therapy: Patient Spontanous Breathing and Patient connected to face mask oxygen  Post-op Assessment: Report given to RN and Post -op Vital signs reviewed and stable  Post vital signs: Reviewed and stable  Last Vitals:  Vitals Value Taken Time  BP 71/50 08/20/2017  5:51 PM  Temp 36.3 C 08/20/2017  5:45 PM  Pulse 86 08/20/2017  5:55 PM  Resp 24 08/20/2017  5:55 PM  SpO2 97 % 08/20/2017  5:55 PM  Vitals shown include unvalidated device data.  Last Pain:  Vitals:   08/20/17 1745  TempSrc:   PainSc: 0-No pain         Complications: No apparent anesthesia complications

## 2017-08-20 NOTE — Progress Notes (Signed)
Subjective:  tmax 99.9  - AVF duplex cannot rule out infection- cont to treat with abx and follow- blood cx drawn on 4/9 neg to date. HD yest- removed 2000.  Arm feels worse  Objective Vital signs in last 24 hours: Vitals:   08/20/17 0001 08/20/17 0606 08/20/17 0835 08/20/17 1007  BP: (!) 72/50 (!) 84/47 (!) 61/32 (!) 84/61  Pulse: 87 82  72  Resp: (!) 23 (!) 26 (!) 22   Temp: 99.1 F (37.3 C) 98.6 F (37 C) 98.2 F (36.8 C)   TempSrc: Oral Oral Oral   SpO2: 93% 95% 96% 98%  Weight:  122.4 kg (269 lb 13.5 oz)    Height:       Weight change: -0.1 kg (-3.5 oz)  Intake/Output Summary (Last 24 hours) at 08/20/2017 1029 Last data filed at 08/19/2017 1700 Gross per 24 hour  Intake 360 ml  Output 2000 ml  Net -1640 ml    Assessment/Plan: 57 year old BM with multiple medical problems including ESRD- Presenting with fever and shortness of breath with elevated white count and low blood pressure 1 Fever and elevated white blood count- the presence of low blood pressure although is typical for the patient brings to mind sepsis. Blood cultures growing MSSA - given ancef, ceftriaxone, vanc and also azithro - now on ancef only.  New c/o access pain- VVS on board- duplex possibly hematoma but cannot rule out infxn, no action on that for now- for TEE and possible ICD removal ?  2 ESRD: Normally does home hemodialysis 5 days per week, done Friday at home and then Tuesday. Wednesday here  Fistula did not appear to be source of infection but now not sure- try to give it a rest- will do labs tomorrow to see if needs HD- rest it as long as able  3 Hypertension: Patient is actually normally baseline hypotensive. He takes Midodrine pre-and posttreatment- This practice will be continued here. He does have some lower extremity edema so will ultrafilter gently,   weight today 122.4- quite a bit under EDW 4. Anemia of ESRD: Hemoglobin here 10.4, now 8.9. - recently given venofer as OP- mircera 225 given 3/28.   Nothing here yet- INR is up- coumadin on hold 5. Metabolic Bone Disease: Is on sensipar 90 mg daily, phoslo 2-4 TID and calcitriol 0.25 daily - continue here     [facility]: Basic Metabolic Panel: Recent Labs  Lab 08/18/17 0305 08/19/17 0249 08/20/17 0354  NA 132* 134* 133*  K 3.7 3.6 3.8  CL 95* 94* 95*  CO2 19* 25 26  GLUCOSE 96 96 90  BUN 60* 33* 19  CREATININE 12.74* 8.56* 6.11*  CALCIUM 7.8* 7.8* 7.8*  PHOS 5.7* 3.8 2.3*   Liver Function Tests: Recent Labs  Lab 08/17/17 0902 08/18/17 0305 08/19/17 0249 08/20/17 0354  AST 24  --   --   --   ALT 13*  --   --   --   ALKPHOS 90  --   --   --   BILITOT 1.1  --   --   --   PROT 6.8  --   --   --   ALBUMIN 3.4* 3.0* 2.9* 3.1*   No results for input(s): LIPASE, AMYLASE in the last 168 hours. No results for input(s): AMMONIA in the last 168 hours. CBC: Recent Labs  Lab 08/17/17 0902 08/18/17 0305 08/19/17 0249 08/20/17 0354  WBC 12.0* 11.5* 7.9 7.2  NEUTROABS 11.1*  --   --   --   HGB 10.5* 9.7* 9.5* 8.9*  HCT 33.0* 30.0* 30.0* 28.0*  MCV 90.7 89.0 90.1 92.1  PLT 78* 58* 60* 78*   Cardiac Enzymes: No results for input(s): CKTOTAL, CKMB, CKMBINDEX, TROPONINI in the last 168 hours. CBG: No results for input(s): GLUCAP in the last 168 hours.  Iron Studies: No results for input(s): IRON, TIBC, TRANSFERRIN, FERRITIN in the last 72 hours. Studies/Results: Dg Shoulder Left  Result Date: 08/18/2017 CLINICAL DATA:  Left upper arm pain and bulge in the distal humerus. Fistula inserted in 2003. EXAM: LEFT SHOULDER - 2+ VIEW COMPARISON:  None. FINDINGS: A vascular graft is seen projecting over the left shoulder along the expected location of the distal subclavian and axillary vessels. Osteoarthritis of the glenohumeral joint with joint space narrowing and subchondral sclerosis of the humeral head. AC joint is intact. The adjacent ribs and lung are nonacute. There is moderate aortic atherosclerosis at  the arch. IMPRESSION: Osteoarthritis of the glenohumeral joint. No acute osseous abnormality. Overlying vascular stent is in place. Electronically Signed   By: Ashley Royalty M.D.   On: 08/18/2017 19:05   Dg Humerus Left  Result Date: 08/18/2017 CLINICAL DATA:  Left upper arm pain with small bulge along the distal humerus. EXAM: LEFT HUMERUS - 2+ VIEW COMPARISON:  None. FINDINGS: A vascular stent is seen along the volar aspect of the distal forearm which appears discontinuous for a length of 17 mm posteriorly and 21 mm along its radial aspect. A portion of the stent appears to have peeled away resulting in the gap. Volar bowing of the vascular stent is noted on the lateral view which may be contributing to the patient's small bulge noted. Subclavian and axillary stent is also seen projecting over the shoulder. Atherosclerotic calcifications are identified along the brachial artery. No acute fracture or bone destruction of the humerus. No joint dislocation. Osteoarthritis of the glenohumeral joint with inferomedial spurring off the humeral head. IMPRESSION: 1. Discontinuous vascular stent along the distal arm with volar bowing as above described. There is a small gap associated with the stent measuring 17 mm posteriorly and 21 mm along its radial aspect with peeling away of a portion of the stent believed to account for this gap. 2. Intact subclavian and axillary stent projecting over the shoulder. 3. No acute osseous abnormality. Electronically Signed   By: Ashley Royalty M.D.   On: 08/18/2017 19:10   Medications: Infusions: . sodium chloride    . sodium chloride    . sodium chloride    . sodium chloride    .  ceFAZolin (ANCEF) IV    .  ceFAZolin (ANCEF) IV Stopped (08/19/17 2118)    Scheduled Medications: . ambrisentan  10 mg Oral Daily  . amiodarone  200 mg Oral QPM  . atorvastatin  40 mg Oral QPM  . calcitRIOL  0.25 mcg Oral Daily  . calcium acetate  1,334 mg Oral TID WC  . chlorhexidine  60 mL  Topical Once  . chlorhexidine  60 mL Topical Once  . cinacalcet  90 mg Oral QPM  . gentamicin irrigation  80 mg Irrigation To SS-Surg  . levothyroxine  300 mcg Oral QAC breakfast  . midodrine  10 mg Oral 3 times per day  . sodium chloride flush  3 mL Intravenous Q12H  . Warfarin - Pharmacist Dosing Inpatient   Does not apply q1800    have reviewed scheduled and prn medications.  Physical Exam: General: alert Heart: RRR- no murmer Lungs: mostly clear Abdomen: soft, non tender Extremities: mild edema Dialysis Access: left upper AVF- sore area more pronounced- close to lime sized- tender but not red or too hot   08/20/2017,10:29 AM  LOS: 3 days

## 2017-08-21 ENCOUNTER — Encounter (HOSPITAL_COMMUNITY): Payer: Self-pay | Admitting: Internal Medicine

## 2017-08-21 ENCOUNTER — Inpatient Hospital Stay (HOSPITAL_COMMUNITY): Payer: Medicare HMO

## 2017-08-21 DIAGNOSIS — Z992 Dependence on renal dialysis: Secondary | ICD-10-CM

## 2017-08-21 DIAGNOSIS — T827XXA Infection and inflammatory reaction due to other cardiac and vascular devices, implants and grafts, initial encounter: Secondary | ICD-10-CM

## 2017-08-21 DIAGNOSIS — N186 End stage renal disease: Secondary | ICD-10-CM

## 2017-08-21 DIAGNOSIS — A4102 Sepsis due to Methicillin resistant Staphylococcus aureus: Secondary | ICD-10-CM

## 2017-08-21 DIAGNOSIS — M7989 Other specified soft tissue disorders: Secondary | ICD-10-CM

## 2017-08-21 DIAGNOSIS — T829XXA Unspecified complication of cardiac and vascular prosthetic device, implant and graft, initial encounter: Secondary | ICD-10-CM

## 2017-08-21 LAB — RENAL FUNCTION PANEL
Albumin: 2.8 g/dL — ABNORMAL LOW (ref 3.5–5.0)
Anion gap: 13 (ref 5–15)
BUN: 28 mg/dL — AB (ref 6–20)
CO2: 24 mmol/L (ref 22–32)
CREATININE: 7.48 mg/dL — AB (ref 0.61–1.24)
Calcium: 7.4 mg/dL — ABNORMAL LOW (ref 8.9–10.3)
Chloride: 96 mmol/L — ABNORMAL LOW (ref 101–111)
GFR calc Af Amer: 8 mL/min — ABNORMAL LOW (ref >60)
GFR, EST NON AFRICAN AMERICAN: 7 mL/min — AB (ref >60)
Glucose, Bld: 137 mg/dL — ABNORMAL HIGH (ref 65–99)
PHOSPHORUS: 3.7 mg/dL (ref 2.5–4.6)
Potassium: 4 mmol/L (ref 3.5–5.1)
SODIUM: 133 mmol/L — AB (ref 135–145)

## 2017-08-21 LAB — PREPARE PLATELET PHERESIS
UNIT DIVISION: 0
Unit division: 0

## 2017-08-21 LAB — CBC
HCT: 25.2 % — ABNORMAL LOW (ref 39.0–52.0)
Hemoglobin: 8.1 g/dL — ABNORMAL LOW (ref 13.0–17.0)
MCH: 29.2 pg (ref 26.0–34.0)
MCHC: 32.1 g/dL (ref 30.0–36.0)
MCV: 91 fL (ref 78.0–100.0)
Platelets: 78 10*3/uL — ABNORMAL LOW (ref 150–400)
RBC: 2.77 MIL/uL — ABNORMAL LOW (ref 4.22–5.81)
RDW: 18.8 % — AB (ref 11.5–15.5)
WBC: 4.7 10*3/uL (ref 4.0–10.5)

## 2017-08-21 LAB — BPAM PLATELET PHERESIS
BLOOD PRODUCT EXPIRATION DATE: 201904122359
BLOOD PRODUCT EXPIRATION DATE: 201904132359
ISSUE DATE / TIME: 201904111518
UNIT TYPE AND RH: 5100
Unit Type and Rh: 6200

## 2017-08-21 LAB — PROTIME-INR
INR: 1.99
PROTHROMBIN TIME: 22.5 s — AB (ref 11.4–15.2)

## 2017-08-21 MED ORDER — DARBEPOETIN ALFA 150 MCG/0.3ML IJ SOSY
150.0000 ug | PREFILLED_SYRINGE | INTRAMUSCULAR | Status: DC
  Administered 2017-08-22: 150 ug via INTRAVENOUS
  Filled 2017-08-21: qty 0.3

## 2017-08-21 MED ORDER — WARFARIN SODIUM 5 MG PO TABS
9.0000 mg | ORAL_TABLET | Freq: Once | ORAL | Status: AC
  Administered 2017-08-21: 9 mg via ORAL
  Filled 2017-08-21: qty 1

## 2017-08-21 NOTE — CV Procedure (Signed)
EP procedure note  Procedure performed: Extraction of a dual-chamber ICD system  Preoperative diagnosis: Staph aureus sepsis  Postoperative diagnosis: Staph aureus sepsis  Description of the procedure: After informed consent was obtained, the patient was taken to the operating room in the fasting state.  Invasive hemodynamic monitoring was performed by the anesthesia service within [facility] in his right radial artery.  General endotracheal anesthesia was provided to the patient.  Initial attempts to puncture the right femoral vein on both the right and left side were unsuccessful and subsequent ultrasound demonstrated no blood flow in his femoral venous system.  The anesthesia service utilized a transesophageal echo to monitor the patient for pericardial effusion.  The right subclavian vein was punctured and a guidewire advanced approximately to the innominate vein which was then occluded.  The  sheath was advanced over the wire into the occluded right subclavian vein and left in place for central venous access.  Attention was then directed at removal of the ICD system.  30 cc of lidocaine was infiltrated into the right infraclavicular region.  The patient's defibrillator had fallen into the axilla.  Electrocautery was utilized to dissect down to the fascia.  The leads were found and the defibrillator generator was pushed up from the axilla and removed in toto with moderate difficulty.  Electrocautery was then utilized to dissect the atrial and defibrillator lead free of its dense fibrous scar tissue all the way up to the sewing sleeve.  It should be noted that the atrial sewing sleeve was never visualized.  The defibrillator sewing sleeve was freed up from its dense scar tissue.  At this point the [employer] LLZ  locking stylette was inserted into both the atrial and defibrillation leads after the leads were cut.  The [place] or L 11 French RL short dissection sheath was advanced over the atrial lead.   Next the [employer] 11 [place] long dissection sheath was advanced over the atrial lead and utilizing a combination of traction and countertraction, pressure and counterpressure, the lead was removed in total.  There is no hemodynamic sequelae.  Next attention was turned to the defibrillation lead.  The [place] RL short dissection sheath was advanced over the defibrillation lead.  A  13 French [employer] dissection sheath was then advanced over the defibrillation lead and gentle traction placed on the lead and the lead was removed in total.  There were no hemodynamic sequelae.  Pressure was held.  The pocket was irrigated.  Electrocautery was utilized to assure hemostasis.  The pocket was closed with multiple layers of Prolene mattress suture.  The patient was returned to the recovery area in stable condition.  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  Cristopher Peru, MD

## 2017-08-21 NOTE — Progress Notes (Signed)
PROGRESS NOTE    Adam Alm.   ALP:[id number]  DOB: 08/24/1960  DOA: 08/17/2017 PCP: [name], MD   Brief Narrative:  Adam Alm. is a 57 y/o with ESRD on home hemodialysis on Monday through Friday for the past year, atrial fibrillation on Coumadin, chronic hypertension, nonischemic cardiomyopathy with AICD, pulmonary hypertension, moderate aortic stenosis He has been feeling well up until one day prior to admission when he developed a fever (102.8) and dyspnea.  Apparently his wife stated that he had felt warm for a few days and may have been having fevers and not realizing it.  He has been doing his dialysis and his fistula has felt fine.  He had a dry cough for 1 week. He was admitted at [facility] in transfer to [facility] for ongoing dialysis.  Blood cultures became positive for MSSA. He received a fluid bolus yesterday and albumin infusion this morning (by nephrology) for hypotension. He is also on midodrine at home.  Subjective: Pt reports only some discomfort at left arm where fistula is. He states it feels better than yesterday  Assessment & Plan:   Principal Problem:   MSSA bacteremia with sepsis (fevers leukocytosis hypotension) - In setting of AICD - Appreciate management by ID- Currently on Ancef and Gentamycin - Pt is s/p removal of defibillator - Pt evaluated by vascular who is recommending IV antibiotics for now that patient has had defibrillator removed. If any further infectious issues arise then they are open to consideration regarding removal of patient's fistula.  Active Problems:    Atrial fibrillation-Elevated INR - continue patient on Amiodarone - pharmacy managing coumadin  Acute on chronic thrombocytopenia - It appears that platelets have been between 101- 150 in the past - INR is elevated and not on any other anticoagulation - Continue bleeding precautions   ESRD/acute on chronic hypotension - on dialysis Monday through  Friday and midodrine - Being managed by nephrology - see fistula discussion above.   Severe pulmonary hypertension Tadalafil is being replaced with Cialis-we will need to hold this due to hypotension -Resume ambrisentan today  Hyperlipidemia -Continue statin  Hypothyroidism Synthroid  DVT prophylaxis: INR elevated-Coumadin Code Status: Full code Family Communication: discussed with patient and family member at bedside.  Disposition Plan: Follow-up on MSSA bacteremia and hypotension- Consultants:   ID  Nephrology Procedures:    Antimicrobials:  Anti-infectives (From admission, onward)   Start     Dose/Rate Route Frequency Ordered Stop   08/20/17 1300  gentamicin (GARAMYCIN) 80 mg in sodium chloride 0.9 % 500 mL irrigation     80 mg Irrigation To ShortStay Surgical 08/19/17 1351 08/20/17 1511   08/20/17 1200  ceFAZolin (ANCEF) 3 g in dextrose 5 % 50 mL IVPB  Status:  Discontinued     3 g 130 mL/hr over 30 Minutes Intravenous To ShortStay Surgical 08/19/17 1351 08/20/17 1954   08/18/17 1800  ceFAZolin (ANCEF) IVPB 1 g/50 mL premix     1 g 100 mL/hr over 30 Minutes Intravenous Every 24 hours 08/18/17 0035     08/18/17 1000  cefTRIAXone (ROCEPHIN) 1 g in sodium chloride 0.9 % 100 mL IVPB  Status:  Discontinued     1 g 200 mL/hr over 30 Minutes Intravenous Every 24 hours 08/17/17 1151 08/18/17 0035   08/18/17 0100  ceFAZolin (ANCEF) IVPB 2g/100 mL premix     2 g 200 mL/hr over 30 Minutes Intravenous  Once 08/18/17 0035 08/18/17 0127   08/17/17  1600  azithromycin (ZITHROMAX) tablet 500 mg  Status:  Discontinued     500 mg Oral Daily 08/17/17 1505 08/18/17 1053   08/17/17 1200  vancomycin (VANCOCIN) 2,000 mg in sodium chloride 0.9 % 500 mL IVPB     2,000 mg 250 mL/hr over 120 Minutes Intravenous  Once 08/17/17 1144 08/17/17 1526   08/17/17 1115  cefTRIAXone (ROCEPHIN) 1 g in sodium chloride 0.9 % 100 mL IVPB     1 g 200 mL/hr over 30 Minutes Intravenous  Once 08/17/17 1111  08/17/17 1313   08/17/17 1115  azithromycin (ZITHROMAX) tablet 500 mg     500 mg Oral  Once 08/17/17 1111 08/17/17 1139       Objective: Vitals:   08/20/17 2005 08/20/17 2349 08/21/17 0425 08/21/17 1200  BP: (!) 77/48 (!) 80/53 (!) 75/51   Pulse: 84 78 74   Resp: 17 (!) 21 (!) 21   Temp: 97.8 F (36.6 C) (!) 97.5 F (36.4 C) (!) 97.4 F (36.3 C) (!) 97.4 F (36.3 C)  TempSrc: Oral Oral Oral   SpO2: 95% 98% 94%   Weight:   128.7 kg (283 lb 11.7 oz)   Height:        Intake/Output Summary (Last 24 hours) at 08/21/2017 1231 Last data filed at 08/21/2017 0858 Gross per 24 hour  Intake 1844 ml  Output 10 ml  Net 1834 ml   Filed Weights   08/19/17 1212 08/20/17 0606 08/21/17 0425  Weight: 121.7 kg (268 lb 4.8 oz) 122.4 kg (269 lb 13.5 oz) 128.7 kg (283 lb 11.7 oz)    Examination: General exam: Appears comfortable, in nad.  HEENT: PERRLA, oral mucosa moist, no sclera icterus or thrush Respiratory system: Clear to auscultation.  Has a dry cough respiratory effort normal. Cardiovascular system: S1 & S2 heard, RRR.   Gastrointestinal system: Abdomen soft, non-tender, nondistended. Normal bowel sound. No organomegaly Central nervous system: Alert and oriented. No focal neurological deficits. Extremities: No cyanosis, clubbing or edema Skin: No rashes or ulcers Psychiatry:  Mood & affect appropriate.   Data Reviewed: I have personally reviewed following labs and imaging studies  CBC: Recent Labs  Lab 08/17/17 0902 08/18/17 0305 08/19/17 0249 08/20/17 0354 08/20/17 1655 08/20/17 2053 08/21/17 0432  WBC 12.0* 11.5* 7.9 7.2  --  6.7 4.7  NEUTROABS 11.1*  --   --   --   --   --   --   HGB 10.5* 9.7* 9.5* 8.9* 11.6* 8.7* 8.1*  HCT 33.0* 30.0* 30.0* 28.0* 34.0* 27.3* 25.2*  MCV 90.7 89.0 90.1 92.1  --  91.3 91.0  PLT 78* 58* 60* 78*  --  80* 78*   Basic Metabolic Panel: Recent Labs  Lab 08/18/17 0305 08/19/17 0249 08/20/17 0354 08/20/17 1655 08/20/17 2053  08/21/17 0432  NA 132* 134* 133* 134* 133* 133*  K 3.7 3.6 3.8 3.7 3.6 4.0  CL 95* 94* 95*  --  96* 96*  CO2 19* 25 26  --  24 24  GLUCOSE 96 96 90  --  136* 137*  BUN 60* 33* 19  --  26* 28*  CREATININE 12.74* 8.56* 6.11*  --  7.03* 7.48*  CALCIUM 7.8* 7.8* 7.8*  --  7.5* 7.4*  PHOS 5.7* 3.8 2.3*  --  2.9 3.7   GFR: Estimated Creatinine Clearance: 15.9 mL/min (A) (by C-G formula based on SCr of 7.48 mg/dL (H)). Liver Function Tests: Recent Labs  Lab 08/17/17 0902 08/18/17 0305 08/19/17 0249 08/20/17  0354 08/20/17 2053 08/21/17 0432  AST 24  --   --   --   --   --   ALT 13*  --   --   --   --   --   ALKPHOS 90  --   --   --   --   --   BILITOT 1.1  --   --   --   --   --   PROT 6.8  --   --   --   --   --   ALBUMIN 3.4* 3.0* 2.9* 3.1* 3.0* 2.8*   No results for input(s): LIPASE, AMYLASE in the last 168 hours. No results for input(s): AMMONIA in the last 168 hours. Coagulation Profile: Recent Labs  Lab 08/18/17 0305 08/19/17 0249 08/19/17 1600 08/20/17 0354 08/21/17 0432  INR 4.58* 4.93* 3.34 2.14 1.99   Cardiac Enzymes: No results for input(s): CKTOTAL, CKMB, CKMBINDEX, TROPONINI in the last 168 hours. BNP (last 3 results) No results for input(s): PROBNP in the last 8760 hours. HbA1C: No results for input(s): HGBA1C in the last 72 hours. CBG: No results for input(s): GLUCAP in the last 168 hours. Lipid Profile: No results for input(s): CHOL, HDL, LDLCALC, TRIG, CHOLHDL, LDLDIRECT in the last 72 hours. Thyroid Function Tests: No results for input(s): TSH, T4TOTAL, FREET4, T3FREE, THYROIDAB in the last 72 hours. Anemia Panel: No results for input(s): VITAMINB12, FOLATE, FERRITIN, TIBC, IRON, RETICCTPCT in the last 72 hours. Urine analysis: No results found for: COLORURINE, APPEARANCEUR, LABSPEC, PHURINE, GLUCOSEU, HGBUR, BILIRUBINUR, KETONESUR, PROTEINUR, UROBILINOGEN, NITRITE, LEUKOCYTESUR Sepsis Labs: @LABRCNTIP (procalcitonin:4,lacticidven:4) ) Recent  Results (from the past 240 hour(s))  Blood culture (routine x 2)     Status: Abnormal   Collection Time: 08/17/17  9:22 AM  Result Value Ref Range Status   Specimen Description   Final    BLOOD RIGHT FOREARM Performed at [facility] [address] [place], [place] [address]    Special Requests   Final    BOTTLES DRAWN AEROBIC AND ANAEROBIC Blood Culture adequate volume Performed at [facility] [address] [place], [place] [address]    Culture  Setup Time   Final    GRAM POSITIVE COCCI IN BOTH AEROBIC AND ANAEROBIC BOTTLES Organism ID to follow CRITICAL RESULT CALLED TO, READ BACK BY AND VERIFIED WITH: A MASTERS PHARMD 08/18/17 0009 JDW Performed at [facility], [place] [address], [place], [place] [address]    Culture STAPHYLOCOCCUS AUREUS (A)  Final   Report Status 08/20/2017 FINAL  Final   Organism ID, Bacteria STAPHYLOCOCCUS AUREUS  Final      Susceptibility   Staphylococcus aureus - MIC*    CIPROFLOXACIN <=0.5 SENSITIVE Sensitive     ERYTHROMYCIN >=8 RESISTANT Resistant     GENTAMICIN <=0.5 SENSITIVE Sensitive     OXACILLIN 0.5 SENSITIVE Sensitive     TETRACYCLINE <=1 SENSITIVE Sensitive     VANCOMYCIN <=0.5 SENSITIVE Sensitive     TRIMETH/SULFA <=10 SENSITIVE Sensitive     CLINDAMYCIN <=0.25 SENSITIVE Sensitive     RIFAMPIN <=0.5 SENSITIVE Sensitive     Inducible Clindamycin NEGATIVE Sensitive     * STAPHYLOCOCCUS AUREUS  Blood Culture ID Panel (Reflexed)     Status: Abnormal   Collection Time: 08/17/17  9:22 AM  Result Value Ref Range Status   Enterococcus species NOT DETECTED NOT DETECTED Final   Listeria monocytogenes NOT DETECTED NOT DETECTED Final   Staphylococcus species DETECTED (A) NOT DETECTED Final  Comment: CRITICAL RESULT CALLED TO, READ BACK BY AND VERIFIED WITH: A MASTERS PHARMD 08/18/17 0009 JDW    Staphylococcus aureus DETECTED (A) NOT DETECTED Final    Comment: Methicillin (oxacillin) susceptible  Staphylococcus aureus (MSSA). Preferred therapy is anti staphylococcal beta lactam antibiotic (Cefazolin or Nafcillin), unless clinically contraindicated. CRITICAL RESULT CALLED TO, READ BACK BY AND VERIFIED WITH: A MASTERS PHARMD 08/18/17 0009 JDW    Methicillin resistance NOT DETECTED NOT DETECTED Final   Streptococcus species NOT DETECTED NOT DETECTED Final   Streptococcus agalactiae NOT DETECTED NOT DETECTED Final   Streptococcus pneumoniae NOT DETECTED NOT DETECTED Final   Streptococcus pyogenes NOT DETECTED NOT DETECTED Final   Acinetobacter baumannii NOT DETECTED NOT DETECTED Final   Enterobacteriaceae species NOT DETECTED NOT DETECTED Final   Enterobacter cloacae complex NOT DETECTED NOT DETECTED Final   Escherichia coli NOT DETECTED NOT DETECTED Final   Klebsiella oxytoca NOT DETECTED NOT DETECTED Final   Klebsiella pneumoniae NOT DETECTED NOT DETECTED Final   Proteus species NOT DETECTED NOT DETECTED Final   Serratia marcescens NOT DETECTED NOT DETECTED Final   Haemophilus influenzae NOT DETECTED NOT DETECTED Final   Neisseria meningitidis NOT DETECTED NOT DETECTED Final   Pseudomonas aeruginosa NOT DETECTED NOT DETECTED Final   Candida albicans NOT DETECTED NOT DETECTED Final   Candida glabrata NOT DETECTED NOT DETECTED Final   Candida krusei NOT DETECTED NOT DETECTED Final   Candida parapsilosis NOT DETECTED NOT DETECTED Final   Candida tropicalis NOT DETECTED NOT DETECTED Final    Comment: Performed at [facility], [place]. [address] [place], [place] [address]  Blood culture (routine x 2)     Status: Abnormal   Collection Time: 08/17/17  9:52 AM  Result Value Ref Range Status   Specimen Description   Final    BLOOD RIGHT FOREARM Performed at [facility] [address] [place], [place] [address]    Special Requests   Final    BOTTLES DRAWN AEROBIC AND ANAEROBIC Blood Culture adequate volume Performed at [facility]  [address] [place], [place] [address]    Culture  Setup Time   Final    GRAM POSITIVE COCCI IN BOTH AEROBIC AND ANAEROBIC BOTTLES CRITICAL RESULT CALLED TO, READ BACK BY AND VERIFIED WITH: A MASTERS PHARMD 08/18/17 00009 JDW    Culture (A)  Final    STAPHYLOCOCCUS AUREUS SUSCEPTIBILITIES PERFORMED ON PREVIOUS CULTURE WITHIN THE LAST 5 DAYS. Performed at [facility], [place] [address] [place], [place] [address]    Report Status 08/20/2017 FINAL  Final  MRSA PCR Screening     Status: None   Collection Time: 08/17/17  8:40 PM  Result Value Ref Range Status   MRSA by PCR NEGATIVE NEGATIVE Final    Comment:        The GeneXpert MRSA Assay (FDA approved for NASAL specimens only), is one component of a comprehensive MRSA colonization surveillance program. It is not intended to diagnose MRSA infection nor to guide or monitor treatment for MRSA infections. Performed at [facility], [place] [address], [place], [place] [address]   Culture, blood (Routine X 2) w Reflex to ID Panel     Status: None (Preliminary result)   Collection Time: 08/18/17  3:05 AM  Result Value Ref Range Status   Specimen Description BLOOD RIGHT HAND  Final   Special Requests   Final    BOTTLES DRAWN AEROBIC ONLY Blood Culture adequate volume  Culture   Final    NO GROWTH 3 DAYS Performed at [facility], [place] [address], [place], [place] [address]    Report Status PENDING  Incomplete  Culture, blood (Routine X 2) w Reflex to ID Panel     Status: None (Preliminary result)   Collection Time: 08/18/17  3:05 AM  Result Value Ref Range Status   Specimen Description BLOOD RIGHT HAND  Final   Special Requests   Final    BOTTLES DRAWN AEROBIC ONLY Blood Culture adequate volume   Culture   Final    NO GROWTH 3 DAYS Performed at [facility], [place] [address], [place], [place] [address]    Report Status PENDING  Incomplete  Culture, sputum-assessment     Status: None   Collection Time: 08/18/17   9:32 PM  Result Value Ref Range Status   Specimen Description EXPECTORATED SPUTUM  Final   Special Requests NONE  Final   Sputum evaluation   Final    RESULT CALLED TO, READ BACK BY AND VERIFIED WITH: Miguel Dibble RN [id number] 08/18/17 A BROWNING Performed at [facility], [place] [address], [place], [place] [address]    Report Status 08/18/2017 FINAL  Final  Surgical PCR screen     Status: Abnormal   Collection Time: 08/20/17 10:11 AM  Result Value Ref Range Status   MRSA, PCR NEGATIVE NEGATIVE Final   Staphylococcus aureus POSITIVE (A) NEGATIVE Final    Comment: (NOTE) The Xpert SA Assay (FDA approved for NASAL specimens in patients 48 years of age and older), is one component of a comprehensive surveillance program. It is not intended to diagnose infection nor to guide or monitor treatment. Performed at [facility], [place] [address], [place], [place] [address]          Radiology Studies: Dg Chest 2 View  Result Date: 08/21/2017 CLINICAL DATA:  Post op removal of pacemaker yesterday - no chest complaints, just soreness - pt unable to raise right arm for lateral view EXAM: CHEST - 2 VIEW COMPARISON:  Chest x-ray dated 08/17/2017. Chest x-ray dated 11/11/2016. FINDINGS: Status post RIGHT chest wall pacemaker removal. Stable cardiomegaly. Aortic atherosclerosis. Stable chronic interstitial prominence. No new lung findings. No pleural effusion or pneumothorax seen. IMPRESSION: 1. Status post RIGHT chest wall pacemaker removal. No pleural effusion or pneumothorax seen. 2. Stable cardiomegaly. 3. Stable chronic interstitial prominence, likely chronic mild CHF/volume overload. 4. Aortic atherosclerosis. Electronically Signed   By: Franki Cabot M.D.   On: 08/21/2017 09:06   Dg C-arm 1-60 Min-no Report  Result Date: 08/20/2017 Fluoroscopy was utilized by the requesting physician.  No radiographic interpretation.   Dg C-arm 1-60 Min-no Report  Result Date: 08/20/2017 Fluoroscopy was  utilized by the requesting physician.  No radiographic interpretation.      Scheduled Meds: . ambrisentan  10 mg Oral Daily  . amiodarone  200 mg Oral QPM  . atorvastatin  40 mg Oral QPM  . calcitRIOL  0.25 mcg Oral Daily  . calcium acetate  1,334 mg Oral TID WC  . Chlorhexidine Gluconate Cloth  6 each Topical Daily  . cinacalcet  90 mg Oral QPM  . [START ON 08/22/2017] darbepoetin (ARANESP) injection - DIALYSIS  150 mcg Intravenous Q Sat-HD  . levothyroxine  300 mcg Oral QAC breakfast  . midodrine  10 mg Oral 3 times per day  . warfarin  9 mg Oral ONCE-1800  . Warfarin - Pharmacist Dosing Inpatient  Does not apply q1800   Continuous Infusions: . sodium chloride    . sodium chloride    . sodium chloride    . sodium chloride 10 mL/hr at 08/20/17 1318  . sodium chloride    .  ceFAZolin (ANCEF) IV Stopped (08/19/17 2118)     LOS: 4 days    Time spent in minutes: [place], MD [facility] Pager:[phone] [email or web address] Password [facility] 08/21/2017, 12:31 PM

## 2017-08-21 NOTE — Progress Notes (Signed)
[facility] for Warfarin Indication: atrial fibrillation  Allergies  Allergen Reactions  . Enalaprilat Swelling    Mouth swelling  . Vasotec [Enalapril] Swelling    MOUTH SWELLING  . Ivp Dye [Iodinated Diagnostic Agents] Nausea And Vomiting    Patient Measurements: Height: 6\' 1"  (185.4 cm) Weight: 283 lb 11.7 oz (128.7 kg) IBW/kg (Calculated) : 79.9   Vital Signs: Temp: 97.4 F (36.3 C) (04/12 0425) Temp Source: Oral (04/12 0425) BP: 75/51 (04/12 0425) Pulse Rate: 74 (04/12 0425)  Labs: Recent Labs    08/19/17 1600 08/20/17 0354 08/20/17 1655 08/20/17 2053 08/21/17 0432  HGB  --  8.9* 11.6* 8.7* 8.1*  HCT  --  28.0* 34.0* 27.3* 25.2*  PLT  --  78*  --  80* 78*  LABPROT 33.6* 23.8*  --   --  22.5*  INR 3.34 2.14  --   --  1.99  CREATININE  --  6.11*  --  7.03* 7.48*    Estimated Creatinine Clearance: 15.9 mL/min (A) (by C-G formula based on SCr of 7.48 mg/dL (H)).  Assessment: 23 y/oM with PMH of ESRD, hypothyroidism, pulmonary hypertension, AS, MS, and PAF on chronic warfarin PTA admitted for sepsis from MSSA bacteremia. Pharmacy consulted to manage warfarin therapy while patient admitted. Noted ICD removal 4/11 and possible AVF graft procedure pending - Per Vascular note, appears to be hematoma at L arm fistula site but no need for removal of fistula at this time. Pharmacy asked to hold warfarin for procedures so far. Will resume dosing today per discussion with Dr. Wendee Beavers.  INR supratherapeutic on admit, peak 4.58. Now s/p vitamin K 5mg  PO x 1 on 4/10, and  INR down to 1.99. No bleeding noted, Hgb 8.1 and drifting down, platelets are low at 78 (100-120s normally).  PTA dose reported as 9mg  daily except 6mg  on Sundays. Noted on amiodarone (pta dose).  Goal of Therapy:  INR 2-3 Monitor platelets by anticoagulation protocol: Yes   Plan:  Resume warfarin 9mg  PO x 1 dose tonight Monitor daily INR, CBC, s/sx bleeding  Elicia Lamp, PharmD, BCPS Clinical Pharmacist Clinical phone for 08/21/2017 until 3:30pm: [phone] If after 3:30pm, please call main pharmacy at: [phone] 08/21/2017 11:27 AM

## 2017-08-21 NOTE — Progress Notes (Signed)
Sats in the low 80's when patient is lying down on RA. While patient is sitting up sats in the upper 90's to 100% on RA.  Patient stated that at one point MD's wanted him to take a sleep study for OSA.

## 2017-08-21 NOTE — Progress Notes (Signed)
Subjective:  Afebrile- underwent  ICD extraction yest -  blood cx drawn on 4/9 neg to date.   Arm feels worse  Objective Vital signs in last 24 hours: Vitals:   08/20/17 1930 08/20/17 2005 08/20/17 2349 08/21/17 0425  BP: (!) 72/49 (!) 77/48 (!) 80/53 (!) 75/51  Pulse: 82 84 78 74  Resp: 14 17 (!) 21 (!) 21  Temp: 98 F (36.7 C) 97.8 F (36.6 C) (!) 97.5 F (36.4 C) (!) 97.4 F (36.3 C)  TempSrc:  Oral Oral Oral  SpO2: 100% 95% 98% 94%  Weight:    128.7 kg (283 lb 11.7 oz)  Height:       Weight change: 4.8 kg (10 lb 9.3 oz)  Intake/Output Summary (Last 24 hours) at 08/21/2017 1010 Last data filed at 08/21/2017 0858 Gross per 24 hour  Intake 1844 ml  Output 10 ml  Net 1834 ml    Assessment/Plan: 57 year old BM with multiple medical problems including ESRD- Presenting with fever and shortness of breath with elevated white count and low blood pressure 1 Fever and elevated white blood count- the presence of low blood pressure although is typical for the patient brings to mind sepsis. Blood cultures growing MSSA - given ancef, ceftriaxone, vanc and also azithro - now on ancef only.  New c/o access pain- VVS on board- duplex possibly hematoma but cannot rule out infxn, no action on that for now- for TEE and  ICD removal - done on 4/11 2 ESRD: Normally does home hemodialysis 5 days per week, done Friday at home and then Tuesday. Wednesday here  Fistula did not appear to be source of infection but now not sure- try to give it a rest- do not need to do today - will  run on Saturday - tomorrow 3 Hypertension: Patient is actually normally baseline hypotensive. He takes Midodrine pre-and posttreatment- This practice will be continued here. He does have some lower extremity edema so will ultrafilter gently,   weight today 122.4- quite a bit under EDW- today 128.7 ?  Not sure of accuracy  4. Anemia of ESRD: Hemoglobin here 10.4, now 8.1. - recently given venofer as OP- mircera 225 given 3/28, will  redose tomorrow.  Nothing here yet- INR is up- coumadin on hold- parameters for transfusion tomorrow if hgb less than 7.5  5. Metabolic Bone Disease: Is on sensipar 90 mg daily, phoslo 2-4 TID and calcitriol 0.25 daily - continue here     [facility]: Basic Metabolic Panel: Recent Labs  Lab 08/20/17 0354 08/20/17 1655 08/20/17 2053 08/21/17 0432  NA 133* 134* 133* 133*  K 3.8 3.7 3.6 4.0  CL 95*  --  96* 96*  CO2 26  --  24 24  GLUCOSE 90  --  136* 137*  BUN 19  --  26* 28*  CREATININE 6.11*  --  7.03* 7.48*  CALCIUM 7.8*  --  7.5* 7.4*  PHOS 2.3*  --  2.9 3.7   Liver Function Tests: Recent Labs  Lab 08/17/17 0902  08/20/17 0354 08/20/17 2053 08/21/17 0432  AST 24  --   --   --   --   ALT 13*  --   --   --   --   ALKPHOS 90  --   --   --   --   BILITOT 1.1  --   --   --   --   PROT 6.8  --   --   --   --  ALBUMIN 3.4*   < > 3.1* 3.0* 2.8*   < > = values in this interval not displayed.   No results for input(s): LIPASE, AMYLASE in the last 168 hours. No results for input(s): AMMONIA in the last 168 hours. CBC: Recent Labs  Lab 08/17/17 0902 08/18/17 0305 08/19/17 0249 08/20/17 0354 08/20/17 1655 08/20/17 2053 08/21/17 0432  WBC 12.0* 11.5* 7.9 7.2  --  6.7 4.7  NEUTROABS 11.1*  --   --   --   --   --   --   HGB 10.5* 9.7* 9.5* 8.9* 11.6* 8.7* 8.1*  HCT 33.0* 30.0* 30.0* 28.0* 34.0* 27.3* 25.2*  MCV 90.7 89.0 90.1 92.1  --  91.3 91.0  PLT 78* 58* 60* 78*  --  80* 78*   Cardiac Enzymes: No results for input(s): CKTOTAL, CKMB, CKMBINDEX, TROPONINI in the last 168 hours. CBG: No results for input(s): GLUCAP in the last 168 hours.  Iron Studies: No results for input(s): IRON, TIBC, TRANSFERRIN, FERRITIN in the last 72 hours. Studies/Results: Dg Chest 2 View  Result Date: 08/21/2017 CLINICAL DATA:  Post op removal of pacemaker yesterday - no chest complaints, just soreness - pt unable to raise right arm for lateral view EXAM: CHEST - 2  VIEW COMPARISON:  Chest x-ray dated 08/17/2017. Chest x-ray dated 11/11/2016. FINDINGS: Status post RIGHT chest wall pacemaker removal. Stable cardiomegaly. Aortic atherosclerosis. Stable chronic interstitial prominence. No new lung findings. No pleural effusion or pneumothorax seen. IMPRESSION: 1. Status post RIGHT chest wall pacemaker removal. No pleural effusion or pneumothorax seen. 2. Stable cardiomegaly. 3. Stable chronic interstitial prominence, likely chronic mild CHF/volume overload. 4. Aortic atherosclerosis. Electronically Signed   By: Franki Cabot M.D.   On: 08/21/2017 09:06   Dg C-arm 1-60 Min-no Report  Result Date: 08/20/2017 Fluoroscopy was utilized by the requesting physician.  No radiographic interpretation.   Dg C-arm 1-60 Min-no Report  Result Date: 08/20/2017 Fluoroscopy was utilized by the requesting physician.  No radiographic interpretation.   Medications: Infusions: . sodium chloride    . sodium chloride    . sodium chloride    . sodium chloride 10 mL/hr at 08/20/17 1318  . sodium chloride    .  ceFAZolin (ANCEF) IV Stopped (08/19/17 2118)    Scheduled Medications: . ambrisentan  10 mg Oral Daily  . amiodarone  200 mg Oral QPM  . atorvastatin  40 mg Oral QPM  . calcitRIOL  0.25 mcg Oral Daily  . calcium acetate  1,334 mg Oral TID WC  . Chlorhexidine Gluconate Cloth  6 each Topical Daily  . cinacalcet  90 mg Oral QPM  . levothyroxine  300 mcg Oral QAC breakfast  . midodrine  10 mg Oral 3 times per day  . Warfarin - Pharmacist Dosing Inpatient   Does not apply q1800    have reviewed scheduled and prn medications.  Physical Exam: General: alert Heart: RRR- no murmer Lungs: mostly clear Abdomen: soft, non tender Extremities: mild edema Dialysis Access: left upper AVF- sore area more pronounced- firm- close to lime sized- tender but not red or too hot   08/21/2017,10:10 AM  LOS: 4 days

## 2017-08-21 NOTE — Progress Notes (Signed)
Subjective  -   Left arm fistula site remains tender, however it is becoming more hard   Physical Exam:  Palpable thrill within fistula Tenderness over hematoma site No erythema       Assessment/Plan:    At this point, I suspect that the area of concern on the patient's left arm fistula is a hematoma from his cannulation site.  This is the appearance it has on ultrasound.  He does not have any surrounding erythema.  This is at risk for secondary infection, however I do not see any evidence of this at this time.  I would recommend continuing to treat him with IV antibiotics now that his ICD has been removed.  If he has any infectious issues now that his ICD has been removed, consideration would have to be for removal of his fistula.  However I see no need for that at this time.  Wells Merril Nagy 08/21/2017 9:54 AM --  Vitals:   08/20/17 2349 08/21/17 0425  BP: (!) 80/53 (!) 75/51  Pulse: 78 74  Resp: (!) 21 (!) 21  Temp: (!) 97.5 F (36.4 C) (!) 97.4 F (36.3 C)  SpO2: 98% 94%    Intake/Output Summary (Last 24 hours) at 08/21/2017 0954 Last data filed at 08/21/2017 0858 Gross per 24 hour  Intake 1844 ml  Output 10 ml  Net 1834 ml     Laboratory CBC    Component Value Date/Time   WBC 4.7 08/21/2017 0432   HGB 8.1 (L) 08/21/2017 0432   HCT 25.2 (L) 08/21/2017 0432   PLT 78 (L) 08/21/2017 0432    BMET    Component Value Date/Time   NA 133 (L) 08/21/2017 0432   K 4.0 08/21/2017 0432   CL 96 (L) 08/21/2017 0432   CO2 24 08/21/2017 0432   GLUCOSE 137 (H) 08/21/2017 0432   BUN 28 (H) 08/21/2017 0432   CREATININE 7.48 (H) 08/21/2017 0432   CREATININE 4.91 (H) 08/13/2015 1223   CALCIUM 7.4 (L) 08/21/2017 0432   GFRNONAA 7 (L) 08/21/2017 0432   GFRNONAA 13 (L) 07/27/2014 1539   GFRAA 8 (L) 08/21/2017 0432   GFRAA 15 (L) 07/27/2014 1539    COAG Lab Results  Component Value Date   INR 1.99 08/21/2017   INR 2.14 08/20/2017   INR 3.34 08/19/2017   No  results found for: PTT  Antibiotics Anti-infectives (From admission, onward)   Start     Dose/Rate Route Frequency Ordered Stop   08/20/17 1300  gentamicin (GARAMYCIN) 80 mg in sodium chloride 0.9 % 500 mL irrigation     80 mg Irrigation To [facility] 08/19/17 1351 08/20/17 1511   08/20/17 1200  ceFAZolin (ANCEF) 3 g in dextrose 5 % 50 mL IVPB  Status:  Discontinued     3 g 130 mL/hr over 30 Minutes Intravenous To [facility] 08/19/17 1351 08/20/17 1954   08/18/17 1800  ceFAZolin (ANCEF) IVPB 1 g/50 mL premix     1 g 100 mL/hr over 30 Minutes Intravenous Every 24 hours 08/18/17 0035     08/18/17 1000  cefTRIAXone (ROCEPHIN) 1 g in sodium chloride 0.9 % 100 mL IVPB  Status:  Discontinued     1 g 200 mL/hr over 30 Minutes Intravenous Every 24 hours 08/17/17 1151 08/18/17 0035   08/18/17 0100  ceFAZolin (ANCEF) IVPB 2g/100 mL premix     2 g 200 mL/hr over 30 Minutes Intravenous  Once 08/18/17 0035 08/18/17 0127   08/17/17 1600  azithromycin [facility]) tablet 500 mg  Status:  Discontinued     500 mg Oral Daily 08/17/17 1505 08/18/17 1053   08/17/17 1200  vancomycin (VANCOCIN) 2,000 mg in sodium chloride 0.9 % 500 mL IVPB     2,000 mg 250 mL/hr over 120 Minutes Intravenous  Once 08/17/17 1144 08/17/17 1526   08/17/17 1115  cefTRIAXone (ROCEPHIN) 1 g in sodium chloride 0.9 % 100 mL IVPB     1 g 200 mL/hr over 30 Minutes Intravenous  Once 08/17/17 1111 08/17/17 1313   08/17/17 1115  azithromycin (ZITHROMAX) tablet 500 mg     500 mg Oral  Once 08/17/17 1111 08/17/17 1139       V. Leia Alf, M.D. [facility] Office: [phone] Pager:  [phone]

## 2017-08-21 NOTE — Progress Notes (Signed)
PHARMACY CONSULT NOTE FOR:  OUTPATIENT  PARENTERAL ANTIBIOTIC THERAPY (OPAT)  Indication: MSSA Bacteremia Regimen: Cefazolin 2 g at end of each HD session at home (5 x weekly) End date: 10/04/17  IV antibiotic discharge orders are pended. To discharging provider:  please sign these orders via discharge navigator,  Select New Orders & click on the button choice - Manage This Unsigned Work.    Levester Fresh, PharmD, BCPS, BCCCP Clinical Pharmacist Clinical phone for 08/21/2017 from [phone]: [phone] If after 2300, please call main pharmacy at: [phone] 08/21/2017 7:14 PM

## 2017-08-21 NOTE — Anesthesia Postprocedure Evaluation (Signed)
Anesthesia Post Note  Patient: Adam [name].  Procedure(s) Performed: ICD EXTRACTION WITH C-ARM (Right Chest) TRANSESOPHAGEAL ECHOCARDIOGRAM (TEE) (N/A )     Patient location during evaluation: PACU Anesthesia Type: General Level of consciousness: awake and alert Pain management: pain level controlled Vital Signs Assessment: post-procedure vital signs reviewed and stable Respiratory status: spontaneous breathing, nonlabored ventilation, respiratory function stable and patient connected to nasal cannula oxygen Cardiovascular status: blood pressure returned to baseline and stable Postop Assessment: no apparent nausea or vomiting Anesthetic complications: no    Last Vitals:  Vitals:   08/20/17 2349 08/21/17 0425  BP: (!) 80/53 (!) 75/51  Pulse: 78 74  Resp: (!) 21 (!) 21  Temp: (!) 36.4 C (!) 36.3 C  SpO2: 98% 94%    Last Pain:  Vitals:   08/21/17 0425  TempSrc: Oral  PainSc:                  Hadley Detloff,W. EDMOND

## 2017-08-21 NOTE — Progress Notes (Signed)
Subjective:  Swelling has gone down and arm  Antibiotics:  Anti-infectives (From admission, onward)   Start     Dose/Rate Route Frequency Ordered Stop   08/20/17 1300  gentamicin (GARAMYCIN) 80 mg in sodium chloride 0.9 % 500 mL irrigation     80 mg Irrigation To ShortStay Surgical 08/19/17 1351 08/20/17 1511   08/20/17 1200  ceFAZolin (ANCEF) 3 g in dextrose 5 % 50 mL IVPB  Status:  Discontinued     3 g 130 mL/hr over 30 Minutes Intravenous To ShortStay Surgical 08/19/17 1351 08/20/17 1954   08/18/17 1800  ceFAZolin (ANCEF) IVPB 1 g/50 mL premix     1 g 100 mL/hr over 30 Minutes Intravenous Every 24 hours 08/18/17 0035     08/18/17 1000  cefTRIAXone (ROCEPHIN) 1 g in sodium chloride 0.9 % 100 mL IVPB  Status:  Discontinued     1 g 200 mL/hr over 30 Minutes Intravenous Every 24 hours 08/17/17 1151 08/18/17 0035   08/18/17 0100  ceFAZolin (ANCEF) IVPB 2g/100 mL premix     2 g 200 mL/hr over 30 Minutes Intravenous  Once 08/18/17 0035 08/18/17 0127   08/17/17 1600  azithromycin (ZITHROMAX) tablet 500 mg  Status:  Discontinued     500 mg Oral Daily 08/17/17 1505 08/18/17 1053   08/17/17 1200  vancomycin (VANCOCIN) 2,000 mg in sodium chloride 0.9 % 500 mL IVPB     2,000 mg 250 mL/hr over 120 Minutes Intravenous  Once 08/17/17 1144 08/17/17 1526   08/17/17 1115  cefTRIAXone (ROCEPHIN) 1 g in sodium chloride 0.9 % 100 mL IVPB     1 g 200 mL/hr over 30 Minutes Intravenous  Once 08/17/17 1111 08/17/17 1313   08/17/17 1115  azithromycin (ZITHROMAX) tablet 500 mg     500 mg Oral  Once 08/17/17 1111 08/17/17 1139      Medications: Scheduled Meds: . ambrisentan  10 mg Oral Daily  . amiodarone  200 mg Oral QPM  . atorvastatin  40 mg Oral QPM  . calcitRIOL  0.25 mcg Oral Daily  . calcium acetate  1,334 mg Oral TID WC  . Chlorhexidine Gluconate Cloth  6 each Topical Daily  . cinacalcet  90 mg Oral QPM  . [START ON 08/22/2017] darbepoetin (ARANESP) injection - DIALYSIS  150 mcg  Intravenous Q Sat-HD  . levothyroxine  300 mcg Oral QAC breakfast  . midodrine  10 mg Oral 3 times per day  . Warfarin - Pharmacist Dosing Inpatient   Does not apply q1800   Continuous Infusions: . sodium chloride    . sodium chloride    . sodium chloride    . sodium chloride 10 mL/hr at 08/20/17 1318  . sodium chloride    .  ceFAZolin (ANCEF) IV Stopped (08/21/17 1802)   PRN Meds:.sodium chloride, sodium chloride, acetaminophen, alteplase, heparin, heparin, heparin, lidocaine (PF), lidocaine-prilocaine, ondansetron (ZOFRAN) IV, oxyCODONE, pentafluoroprop-tetrafluoroeth, promethazine    Objective: Weight change: 10 lb 9.3 oz (4.8 kg)  Intake/Output Summary (Last 24 hours) at 08/21/2017 1804 Last data filed at 08/21/2017 1600 Gross per 24 hour  Intake 620 ml  Output 1 ml  Net 619 ml   Blood pressure (!) 82/60, pulse 74, temperature (!) 97.4 F (36.3 C), resp. rate (!) 21, height 6\' 1"  (1.854 m), weight 283 lb 11.7 oz (128.7 kg), SpO2 94 %. Temp:  [97.4 F (36.3 C)-98 F (36.7 C)] 97.4 F (36.3 C) (04/12 1200) Pulse Rate:  [74-86]  74 (04/12 0425) Resp:  [11-24] 21 (04/12 0425) BP: (66-87)/(42-60) 82/60 (04/12 1235) SpO2:  [94 %-100 %] 94 % (04/12 0425) Arterial Line BP: (58-86)/(25-42) 62/29 (04/11 1930) Weight:  [283 lb 11.7 oz (128.7 kg)] 283 lb 11.7 oz (128.7 kg) (04/12 0425)  Physical Exam: General: Alert and awake, oriented x3, not in any acute distress. HEENT: anicteric sclera,EOMI CVS regular rate, normal r,  no murmur rubs or gallops, ICD site clean Chest: clear to auscultation bilaterally, no wheezing, rales or rhonchi Abdomen: soft nontender, nondistended, normal bowel sounds, Extremities: skin  --he may have beginnings of superficial thrombus on right PIV site  AVF site has an area of edema and tenderness  08/20/2017:     08/21/2017:     Neuro: nonfocal  CBC:  CBC Latest Ref Rng & Units 08/21/2017 08/20/2017 08/20/2017  WBC 4.0 - 10.5 K/uL 4.7 6.7  -  Hemoglobin 13.0 - 17.0 g/dL 8.1(L) 8.7(L) 11.6(L)  Hematocrit 39.0 - 52.0 % 25.2(L) 27.3(L) 34.0(L)  Platelets 150 - 400 K/uL 78(L) 80(L) -     BMET Recent Labs    08/20/17 2053 08/21/17 0432  NA 133* 133*  K 3.6 4.0  CL 96* 96*  CO2 24 24  GLUCOSE 136* 137*  BUN 26* 28*  CREATININE 7.03* 7.48*  CALCIUM 7.5* 7.4*     Liver Panel  Recent Labs    08/20/17 2053 08/21/17 0432  ALBUMIN 3.0* 2.8*       Sedimentation Rate No results for input(s): ESRSEDRATE in the last 72 hours. C-Reactive Protein No results for input(s): CRP in the last 72 hours.  Micro Results: Recent Results (from the past 720 hour(s))  Blood culture (routine x 2)     Status: Abnormal   Collection Time: 08/17/17  9:22 AM  Result Value Ref Range Status   Specimen Description   Final    BLOOD RIGHT FOREARM Performed at [facility] [address], [place], [place] [address]    Special Requests   Final    BOTTLES DRAWN AEROBIC AND ANAEROBIC Blood Culture adequate volume Performed at [facility] [address], [place], [place] [address]    Culture  Setup Time   Final    GRAM POSITIVE COCCI IN BOTH AEROBIC AND ANAEROBIC BOTTLES Organism ID to follow CRITICAL RESULT CALLED TO, READ BACK BY AND VERIFIED WITH: A MASTERS PHARMD 08/18/17 0009 JDW Performed at [facility], [place] [address], [place], [place] [address]    Culture STAPHYLOCOCCUS AUREUS (A)  Final   Report Status 08/20/2017 FINAL  Final   Organism ID, Bacteria STAPHYLOCOCCUS AUREUS  Final      Susceptibility   Staphylococcus aureus - MIC*    CIPROFLOXACIN <=0.5 SENSITIVE Sensitive     ERYTHROMYCIN >=8 RESISTANT Resistant     GENTAMICIN <=0.5 SENSITIVE Sensitive     OXACILLIN 0.5 SENSITIVE Sensitive     TETRACYCLINE <=1 SENSITIVE Sensitive     VANCOMYCIN <=0.5 SENSITIVE Sensitive     TRIMETH/SULFA <=10 SENSITIVE Sensitive     CLINDAMYCIN <=0.25 SENSITIVE Sensitive     RIFAMPIN <=0.5  SENSITIVE Sensitive     Inducible Clindamycin NEGATIVE Sensitive     * STAPHYLOCOCCUS AUREUS  Blood Culture ID Panel (Reflexed)     Status: Abnormal   Collection Time: 08/17/17  9:22 AM  Result Value Ref Range Status   Enterococcus species NOT DETECTED NOT DETECTED Final   Listeria monocytogenes NOT DETECTED NOT DETECTED Final   Staphylococcus species DETECTED (  A) NOT DETECTED Final    Comment: CRITICAL RESULT CALLED TO, READ BACK BY AND VERIFIED WITH: A MASTERS PHARMD 08/18/17 0009 JDW    Staphylococcus aureus DETECTED (A) NOT DETECTED Final    Comment: Methicillin (oxacillin) susceptible Staphylococcus aureus (MSSA). Preferred therapy is anti staphylococcal beta lactam antibiotic (Cefazolin or Nafcillin), unless clinically contraindicated. CRITICAL RESULT CALLED TO, READ BACK BY AND VERIFIED WITH: A MASTERS PHARMD 08/18/17 0009 JDW    Methicillin resistance NOT DETECTED NOT DETECTED Final   Streptococcus species NOT DETECTED NOT DETECTED Final   Streptococcus agalactiae NOT DETECTED NOT DETECTED Final   Streptococcus pneumoniae NOT DETECTED NOT DETECTED Final   Streptococcus pyogenes NOT DETECTED NOT DETECTED Final   Acinetobacter baumannii NOT DETECTED NOT DETECTED Final   Enterobacteriaceae species NOT DETECTED NOT DETECTED Final   Enterobacter cloacae complex NOT DETECTED NOT DETECTED Final   Escherichia coli NOT DETECTED NOT DETECTED Final   Klebsiella oxytoca NOT DETECTED NOT DETECTED Final   Klebsiella pneumoniae NOT DETECTED NOT DETECTED Final   Proteus species NOT DETECTED NOT DETECTED Final   Serratia marcescens NOT DETECTED NOT DETECTED Final   Haemophilus influenzae NOT DETECTED NOT DETECTED Final   Neisseria meningitidis NOT DETECTED NOT DETECTED Final   Pseudomonas aeruginosa NOT DETECTED NOT DETECTED Final   Candida albicans NOT DETECTED NOT DETECTED Final   Candida glabrata NOT DETECTED NOT DETECTED Final   Candida krusei NOT DETECTED NOT DETECTED Final   Candida  parapsilosis NOT DETECTED NOT DETECTED Final   Candida tropicalis NOT DETECTED NOT DETECTED Final    Comment: Performed at [facility], [place]. [address] [place], [place] [address]  Blood culture (routine x 2)     Status: Abnormal   Collection Time: 08/17/17  9:52 AM  Result Value Ref Range Status   Specimen Description   Final    BLOOD RIGHT FOREARM Performed at [facility] [address] [place], [place] [address]    Special Requests   Final    BOTTLES DRAWN AEROBIC AND ANAEROBIC Blood Culture adequate volume Performed at [facility] [address], [place], [place] [address]    Culture  Setup Time   Final    GRAM POSITIVE COCCI IN BOTH AEROBIC AND ANAEROBIC BOTTLES CRITICAL RESULT CALLED TO, READ BACK BY AND VERIFIED WITH: A MASTERS PHARMD 08/18/17 00009 JDW    Culture (A)  Final    STAPHYLOCOCCUS AUREUS SUSCEPTIBILITIES PERFORMED ON PREVIOUS CULTURE WITHIN THE LAST 5 DAYS. Performed at [facility], [place] [address], [place], [place] [address]    Report Status 08/20/2017 FINAL  Final  MRSA PCR Screening     Status: None   Collection Time: 08/17/17  8:40 PM  Result Value Ref Range Status   MRSA by PCR NEGATIVE NEGATIVE Final    Comment:        The GeneXpert MRSA Assay (FDA approved for NASAL specimens only), is one component of a comprehensive MRSA colonization surveillance program. It is not intended to diagnose MRSA infection nor to guide or monitor treatment for MRSA infections. Performed at [facility], [place] [address], [place], [place] [address]   Culture, blood (Routine X 2) w Reflex to ID Panel     Status: None (Preliminary result)   Collection Time: 08/18/17  3:05 AM  Result Value Ref Range Status   Specimen Description BLOOD RIGHT HAND  Final   Special Requests   Final    BOTTLES DRAWN AEROBIC ONLY  Blood Culture adequate volume   Culture   Final    NO GROWTH 3 DAYS Performed at [facility], [place] [address] [place], [place] [address]    Report Status PENDING  Incomplete  Culture, blood (Routine X 2) w Reflex to ID Panel     Status: None (Preliminary result)   Collection Time: 08/18/17  3:05 AM  Result Value Ref Range Status   Specimen Description BLOOD RIGHT HAND  Final   Special Requests   Final    BOTTLES DRAWN AEROBIC ONLY Blood Culture adequate volume   Culture   Final    NO GROWTH 3 DAYS Performed at [facility], [place] [address] [place], [place] [address]    Report Status PENDING  Incomplete  Culture, sputum-assessment     Status: None   Collection Time: 08/18/17  9:32 PM  Result Value Ref Range Status   Specimen Description EXPECTORATED SPUTUM  Final   Special Requests NONE  Final   Sputum evaluation   Final    RESULT CALLED TO, READ BACK BY AND VERIFIED WITH: Miguel Dibble RN [id number] 08/18/17 A BROWNING Performed at [facility], [place] [address] [place], [place] [address]    Report Status 08/18/2017 FINAL  Final  Surgical PCR screen     Status: Abnormal   Collection Time: 08/20/17 10:11 AM  Result Value Ref Range Status   MRSA, PCR NEGATIVE NEGATIVE Final   Staphylococcus aureus POSITIVE (A) NEGATIVE Final    Comment: (NOTE) The Xpert SA Assay (FDA approved for NASAL specimens in patients 49 years of age and older), is one component of a comprehensive surveillance program. It is not intended to diagnose infection nor to guide or monitor treatment. Performed at [facility], [place] [address], [place], [place] [address]     Studies/Results: Dg Chest 2 View  Result Date: 08/21/2017 CLINICAL DATA:  Post op removal of pacemaker yesterday - no chest complaints, just soreness - pt unable to raise right arm for lateral view EXAM: CHEST - 2 VIEW COMPARISON:  Chest x-ray dated 08/17/2017. Chest x-ray dated 11/11/2016. FINDINGS: Status post RIGHT chest wall pacemaker removal. Stable cardiomegaly. Aortic atherosclerosis. Stable chronic interstitial  prominence. No new lung findings. No pleural effusion or pneumothorax seen. IMPRESSION: 1. Status post RIGHT chest wall pacemaker removal. No pleural effusion or pneumothorax seen. 2. Stable cardiomegaly. 3. Stable chronic interstitial prominence, likely chronic mild CHF/volume overload. 4. Aortic atherosclerosis. Electronically Signed   By: Franki Cabot M.D.   On: 08/21/2017 09:06   Dg C-arm 1-60 Min-no Report  Result Date: 08/20/2017 Fluoroscopy was utilized by the requesting physician.  No radiographic interpretation.   Dg C-arm 1-60 Min-no Report  Result Date: 08/20/2017 Fluoroscopy was utilized by the requesting physician.  No radiographic interpretation.      Assessment/Plan:  INTERVAL HISTORY: ICD has been removed   Principal Problem:   MSSA bacteremia Active Problems:   Atrial fibrillation [I48.91]   Pulmonary hypertension ([facility])   End-stage renal disease ([place])   Acute respiratory failure with hypoxia ([place])   Hypothyroidism   Sepsis ([facility] [place])    Adam [name]. is a 57 y.o. male with  ESRD on HD at home, w ICD admitted with MSSA bacteremia and my definition ICD infection  I have concerns about potential AV fistula infection.  Yes there could be hematoma but this could be infected.  I worry that IF we go through trouble of removing his ICD and  the fistula remains infected we will risk RE-SEEDING a NEW ICD when that gets placed  Obviously this is NOT a TRIVIAL surgery  I greatly appreciate Dr. Abby Potash and Dr. Lovena Le and Dr. Verita Lamb  help here       [facility] Antimicrobial Management Team Staphylococcus aureus bacteremia   Staphylococcus aureus bacteremia (SAB) is associated with a high rate of complications and mortality.  Specific aspects of clinical management are critical to optimizing the outcome of patients with SAB.  Therefore, the [facility] Antimicrobial Management Team [facility]) has initiated an intervention aimed at improving the management of  SAB at [facility].  To do so, Infectious Diseases physicians are providing an evidence-based consult for the management of all patients with SAB.     Yes No Comments  Perform follow-up blood cultures (even if the patient is afebrile) to ensure clearance of bacteremia [x]  []   repeat cultures taken 08/18/17 no growth  Remove vascular catheter and obtain follow-up blood cultures after the removal of the catheter []  []    Perform echocardiography to evaluate for endocarditis (transthoracic ECHO is 40-50% sensitive, TEE is > 90% sensitive) [x]  []  Please keep in mind, that neither test can definitively EXCLUDE endocarditis, and that should clinical suspicion remain high for endocarditis the patient should then still be treated with an "endocarditis" duration of therapy = 6 weeks he needs a transesophageal echocardiogram  Consult electrophysiologist to evaluate implanted cardiac device (pacemaker, ICD) [x]  []   ICD has been removed  Ensure source control []  []  Have all abscesses been drained effectively? Have deep seeded infections (septic joints or osteomyelitis) had appropriate surgical debridement?  See Above discussion I am very worried that the fistula is infected and will need to be removed  Investigate for "metastatic" sites of infection [x]  []  Does the patient have ANY symptom or physical exam finding that would suggest a deeper infection (back or neck pain that may be suggestive of vertebral osteomyelitis or epidural abscess, muscle pain that could be a symptom of pyomyositis)?  Keep in mind that for deep seeded infections MRI imaging with contrast is preferred rather than other often insensitive tests such as plain x-rays, especially early in a patient's presentation.  Change antibiotic therapy to cefazolin [x]  []  Beta-lactam antibiotics are preferred for MSSA due to higher cure rates.   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 Enalaprilat Swelling    Mouth swelling  . Vasotec [Enalapril] Swelling    MOUTH SWELLING  . Ivp Dye [Iodinated Diagnostic Agents] Nausea And Vomiting    OPAT Orders Discharge antibiotics: Cefazolin per pharmacy. I am uncertain how this would be dosed with HOME DIALYSIS which he does daily  Duration: 6 weeks  End Date: Oct 01, 2017  :  Labs weekly while on IV antibiotics: _x_ CBC with differential   Fax weekly labs to [phone]  Clinic Follow Up Appt:   Next 3 weeks  Dr. Linus Salmons is available for questions this weekend.    LOS: 4 days   Alcide Evener 08/21/2017, 6:04 PM

## 2017-08-21 NOTE — Progress Notes (Addendum)
Progress Note  Patient Name: Adam [name]. Date of Encounter: 08/21/2017  Primary Cardiologist: Dr. Percival Spanish Device: Dr. Sallyanne Kuster  Subjective   No complaints of CP, palpitations or SOB, some LUE fistula site discomfort, minimal discomfort at R chest/extraction site  Inpatient Medications    Scheduled Meds: . ambrisentan  10 mg Oral Daily  . amiodarone  200 mg Oral QPM  . atorvastatin  40 mg Oral QPM  . calcitRIOL  0.25 mcg Oral Daily  . calcium acetate  1,334 mg Oral TID WC  . Chlorhexidine Gluconate Cloth  6 each Topical Daily  . cinacalcet  90 mg Oral QPM  . levothyroxine  300 mcg Oral QAC breakfast  . midodrine  10 mg Oral 3 times per day  . Warfarin - Pharmacist Dosing Inpatient   Does not apply q1800   Continuous Infusions: . sodium chloride    . sodium chloride    . sodium chloride    . sodium chloride 10 mL/hr at 08/20/17 1318  . sodium chloride    .  ceFAZolin (ANCEF) IV Stopped (08/19/17 2118)   PRN Meds: sodium chloride, sodium chloride, acetaminophen, alteplase, heparin, heparin, heparin, lidocaine (PF), lidocaine-prilocaine, ondansetron (ZOFRAN) IV, oxyCODONE, pentafluoroprop-tetrafluoroeth, promethazine   Vital Signs    Vitals:   08/20/17 1930 08/20/17 2005 08/20/17 2349 08/21/17 0425  BP: (!) 72/49 (!) 77/48 (!) 80/53 (!) 75/51  Pulse: 82 84 78 74  Resp: 14 17 (!) 21 (!) 21  Temp: 98 F (36.7 C) 97.8 F (36.6 C) (!) 97.5 F (36.4 C) (!) 97.4 F (36.3 C)  TempSrc:  Oral Oral Oral  SpO2: 100% 95% 98% 94%  Weight:    283 lb 11.7 oz (128.7 kg)  Height:        Intake/Output Summary (Last 24 hours) at 08/21/2017 0915 Last data filed at 08/21/2017 0858 Gross per 24 hour  Intake 1844 ml  Output 10 ml  Net 1834 ml   Filed Weights   08/19/17 1212 08/20/17 0606 08/21/17 0425  Weight: 268 lb 4.8 oz (121.7 kg) 269 lb 13.5 oz (122.4 kg) 283 lb 11.7 oz (128.7 kg)    Telemetry    SR - Personally Reviewed  ECG    SR, PACs, PVC -  Personally Reviewed  Physical Exam   GEN: No acute distress.   Neck: No JVD Cardiac: RRR, 1-2/6 SM, rubs, or gallops.  Respiratory: CTA b/l. GI: Soft, nontender, non-distended  MS: No edema; No deformity. Neuro:  Nonfocal  Psych: Normal affect  R chest/extraction site, mild oozing noted, sutures intact B/l groins are soft, non-tender, no hematomas  Labs    Chemistry Recent Labs  Lab 08/17/17 0902  08/20/17 0354 08/20/17 1655 08/20/17 2053 08/21/17 0432  NA 133*   < > 133* 134* 133* 133*  K 4.2   < > 3.8 3.7 3.6 4.0  CL 93*   < > 95*  --  96* 96*  CO2 21*   < > 26  --  24 24  GLUCOSE 106*   < > 90  --  136* 137*  BUN 55*   < > 19  --  26* 28*  CREATININE 10.98*   < > 6.11*  --  7.03* 7.48*  CALCIUM 8.2*   < > 7.8*  --  7.5* 7.4*  PROT 6.8  --   --   --   --   --   ALBUMIN 3.4*   < > 3.1*  --  3.0* 2.8*  AST 24  --   --   --   --   --   ALT 13*  --   --   --   --   --   ALKPHOS 90  --   --   --   --   --   BILITOT 1.1  --   --   --   --   --   GFRNONAA 5*   < > 9*  --  8* 7*  GFRAA 5*   < > 11*  --  9* 8*  ANIONGAP 19*   < > 12  --  13 13   < > = values in this interval not displayed.     Hematology Recent Labs  Lab 08/20/17 0354 08/20/17 1655 08/20/17 2053 08/21/17 0432  WBC 7.2  --  6.7 4.7  RBC 3.04*  --  2.99* 2.77*  HGB 8.9* 11.6* 8.7* 8.1*  HCT 28.0* 34.0* 27.3* 25.2*  MCV 92.1  --  91.3 91.0  MCH 29.3  --  29.1 29.2  MCHC 31.8  --  31.9 32.1  RDW 19.1*  --  18.9* 18.8*  PLT 78*  --  80* 78*    Cardiac EnzymesNo results for input(s): TROPONINI in the last 168 hours. No results for input(s): TROPIPOC in the last 168 hours.   BNPNo results for input(s): BNP, PROBNP in the last 168 hours.   DDimer No results for input(s): DDIMER in the last 168 hours.   Radiology    Dg Chest 2 View Result Date: 08/17/2017 CLINICAL DATA:  Fever and SOB.  Symptoms for 1 week. EXAM: CHEST - 2 VIEW COMPARISON:  11/11/2016. FINDINGS: Cardiomegaly. Dual lead pacer.  Mild vascular congestion without overt edema or consolidation. No effusion or pneumothorax. Vascular stent overlies the LEFT axilla. IMPRESSION: Cardiomegaly with mild vascular congestion but no frank edema or consolidation. Similar appearance to priors. Electronically Signed   By: Staci Righter M.D.   On: 08/17/2017 09:24   Dg Shoulder Left Result Date: 08/18/2017 CLINICAL DATA:  Left upper arm pain and bulge in the distal humerus. Fistula inserted in 2003. EXAM: LEFT SHOULDER - 2+ VIEW COMPARISON:  None. FINDINGS: A vascular graft is seen projecting over the left shoulder along the expected location of the distal subclavian and axillary vessels. Osteoarthritis of the glenohumeral joint with joint space narrowing and subchondral sclerosis of the humeral head. AC joint is intact. The adjacent ribs and lung are nonacute. There is moderate aortic atherosclerosis at the arch. IMPRESSION: Osteoarthritis of the glenohumeral joint. No acute osseous abnormality. Overlying vascular stent is in place. Electronically Signed   By: Ashley Royalty M.D.   On: 08/18/2017 19:05   Dg Humerus Left Result Date: 08/18/2017 CLINICAL DATA:  Left upper arm pain with small bulge along the distal humerus. EXAM: LEFT HUMERUS - 2+ VIEW COMPARISON:  None. FINDINGS: A vascular stent is seen along the volar aspect of the distal forearm which appears discontinuous for a length of 17 mm posteriorly and 21 mm along its radial aspect. A portion of the stent appears to have peeled away resulting in the gap. Volar bowing of the vascular stent is noted on the lateral view which may be contributing to the patient's small bulge noted. Subclavian and axillary stent is also seen projecting over the shoulder. Atherosclerotic calcifications are identified along the brachial artery. No acute fracture or bone destruction of the humerus. No joint dislocation. Osteoarthritis of the glenohumeral joint with inferomedial spurring  off the humeral head. IMPRESSION:  1. Discontinuous vascular stent along the distal arm with volar bowing as above described. There is a small gap associated with the stent measuring 17 mm posteriorly and 21 mm along its radial aspect with peeling away of a portion of the stent believed to account for this gap. 2. Intact subclavian and axillary stent projecting over the shoulder. 3. No acute osseous abnormality. Electronically Signed   By: Ashley Royalty M.D.   On: 08/18/2017 19:10    Cardiac Studies    09/02/16: TTE  Study Conclusions - Left ventricle: The cavity size was normal. Wall thickness was increased in a pattern of mild LVH. Systolic function was normal. The estimated ejection fraction was in the range of 55% to 60%. Wall motion was normal; there were no regional wall motion abnormalities. - Aortic valve: Moderately to severely calcified annulus. Moderately thickened, moderately calcified leaflets. There was moderate stenosis. - Mitral valve: The findings are consistent with moderate stenosis. - Left atrium: The atrium was mildly dilated. - Right ventricle: The cavity size was mildly dilated. Systolic function was mildly reduced. - Right atrium: The atrium was severely dilated. - Tricuspid valve: There was moderate-severe regurgitation. - Pulmonary arteries: Systolic pressure was moderately to severely increased. PA peak pressure: 65 mm Hg (S).     Patient Profile     57 y.o. male with a hx of NICM, ESRF on HD (hx of failed renal transplant), PAD w/hx of toe amputation, PAFib, P.HTN, chronic hypotension on midodrine, VHD w/mod MS/AS followed by Dr. Percival Spanish with MSSA bacteremia  Assessment & Plan    1. MSSA bacteremia     ID on case, noted recommended extraction of device regardless if clear vegetation or not by TEE  Pre-explant ICD interrogation done 08/18/17 0.3% VP AS/VS 99.6% One NSVT since last check only Interrogate ALL: notes VT episodes in monitor zone in 2016,  One EGM  available noted 39 seconds, 154bpm No treated episodes Dr. Victorino December noted in 2017: "He reports 2 previous defibrillator shocks that each occurred roughly 4 years ago on separate occasions. He did not have syncope with either event. He believes that the defibrillator discharges were "appropriate"."  Now s/p ICD system extraction yesterday with Dr. Lovena Le  We have ordered life vest for the patient, [employer] representative aware, records/order faxed in.  Needs to be palced prior to discharge, fitting scheduled for tomorrow.  If plans for discharge today, [facility] will need to be notified for fitting/placement today Wound care discussed with the patient Wound check/sture removal appt has been arranged F/u with Dr. Sallyanne Kuster for 6 weeks made as well.  OK to resume warfarin from post-procedural standpoint   2. Hx of NICM     ICD (Right sided)     Last several echos have noted improved LVEF   3. PAFib     CHA2DS2Vasc is 2 with hx of PAD and prior NICM     Out patient on warfarin, none here     s/p Vit K PO yesterday, INR this AM down to 2.14   4. ESRF on HD 5. Chronic anemia     LUE AV fistula     Vascular on board to evaluate for potential infection source, vascular service is following  6. Chronic Hypotension     Patient reports somewhere in the 70's-80's SB is his baseline even on non-HD days     On midodrine  7. P.HTN (severe)     On med therapy  For questions or updates, please contact [place] Please consult [email or web address] for contact info under Cardiology/STEMI.      Signed, Baldwin [place], PA-C  08/21/2017, 9:15 AM    EP Attending  Patient seen and examined. Agree with above. He is doing well after ICD system extraction. Minimal incisional pain. Antibiotics as per ID service. He will be discharged home with a life vest. Ok for DC from my perspective once outpatient anti-biotics and life vest arranged.  Mikle Bosworth.D.

## 2017-08-22 DIAGNOSIS — I480 Paroxysmal atrial fibrillation: Secondary | ICD-10-CM

## 2017-08-22 LAB — RENAL FUNCTION PANEL
Albumin: 2.9 g/dL — ABNORMAL LOW (ref 3.5–5.0)
Anion gap: 14 (ref 5–15)
BUN: 37 mg/dL — AB (ref 6–20)
CHLORIDE: 96 mmol/L — AB (ref 101–111)
CO2: 24 mmol/L (ref 22–32)
CREATININE: 8.57 mg/dL — AB (ref 0.61–1.24)
Calcium: 7.9 mg/dL — ABNORMAL LOW (ref 8.9–10.3)
GFR calc Af Amer: 7 mL/min — ABNORMAL LOW (ref >60)
GFR, EST NON AFRICAN AMERICAN: 6 mL/min — AB (ref >60)
Glucose, Bld: 129 mg/dL — ABNORMAL HIGH (ref 65–99)
Phosphorus: 3.3 mg/dL (ref 2.5–4.6)
Potassium: 3.9 mmol/L (ref 3.5–5.1)
Sodium: 134 mmol/L — ABNORMAL LOW (ref 135–145)

## 2017-08-22 LAB — CBC
HEMATOCRIT: 25.8 % — AB (ref 39.0–52.0)
HEMOGLOBIN: 8.2 g/dL — AB (ref 13.0–17.0)
MCH: 28.4 pg (ref 26.0–34.0)
MCHC: 31.8 g/dL (ref 30.0–36.0)
MCV: 89.3 fL (ref 78.0–100.0)
Platelets: 101 10*3/uL — ABNORMAL LOW (ref 150–400)
RBC: 2.89 MIL/uL — ABNORMAL LOW (ref 4.22–5.81)
RDW: 18.4 % — ABNORMAL HIGH (ref 11.5–15.5)
WBC: 6.2 10*3/uL (ref 4.0–10.5)

## 2017-08-22 LAB — PROTIME-INR
INR: 2.43
Prothrombin Time: 26.2 seconds — ABNORMAL HIGH (ref 11.4–15.2)

## 2017-08-22 MED ORDER — DARBEPOETIN ALFA 150 MCG/0.3ML IJ SOSY
PREFILLED_SYRINGE | INTRAMUSCULAR | Status: AC
  Filled 2017-08-22: qty 0.3

## 2017-08-22 MED ORDER — WARFARIN SODIUM 3 MG PO TABS
3.0000 mg | ORAL_TABLET | Freq: Once | ORAL | Status: AC
  Administered 2017-08-22: 3 mg via ORAL
  Filled 2017-08-22 (×2): qty 1

## 2017-08-22 MED ORDER — MIDODRINE HCL 5 MG PO TABS
ORAL_TABLET | ORAL | Status: AC
  Filled 2017-08-22: qty 2

## 2017-08-22 NOTE — Progress Notes (Signed)
Progress Note   Subjective   Doing well today, the patient denies CP or SOB.  No new concerns  Inpatient Medications    Scheduled Meds: . ambrisentan  10 mg Oral Daily  . amiodarone  200 mg Oral QPM  . atorvastatin  40 mg Oral QPM  . calcitRIOL  0.25 mcg Oral Daily  . calcium acetate  1,334 mg Oral TID WC  . Chlorhexidine Gluconate Cloth  6 each Topical Daily  . cinacalcet  90 mg Oral QPM  . darbepoetin (ARANESP) injection - DIALYSIS  150 mcg Intravenous Q Sat-HD  . levothyroxine  300 mcg Oral QAC breakfast  . midodrine  10 mg Oral 3 times per day  . Warfarin - Pharmacist Dosing Inpatient   Does not apply q1800   Continuous Infusions: . sodium chloride    . sodium chloride    . sodium chloride    . sodium chloride 10 mL/hr at 08/21/17 1802  . sodium chloride    .  ceFAZolin (ANCEF) IV Stopped (08/21/17 1802)   PRN Meds: sodium chloride, sodium chloride, acetaminophen, alteplase, heparin, heparin, heparin, lidocaine (PF), lidocaine-prilocaine, ondansetron (ZOFRAN) IV, oxyCODONE, pentafluoroprop-tetrafluoroeth, promethazine   Vital Signs    Vitals:   08/22/17 0440 08/22/17 0755 08/22/17 0807 08/22/17 0813  BP: (!) 79/56 (!) 84/47 (!) 84/51 (!) 79/50  Pulse: 75 71 70 71  Resp: 17 16 15 18   Temp: (!) 97.3 F (36.3 C) (!) 97.5 F (36.4 C)    TempSrc: Oral Oral    SpO2: 93% 99% 100% 96%  Weight: 278 lb 7.1 oz (126.3 kg) 278 lb 10.6 oz (126.4 kg)    Height:        Intake/Output Summary (Last 24 hours) at 08/22/2017 0914 Last data filed at 08/21/2017 1802 Gross per 24 hour  Intake 70 ml  Output 1 ml  Net 69 ml   Filed Weights   08/21/17 0425 08/22/17 0440 08/22/17 0755  Weight: 283 lb 11.7 oz (128.7 kg) 278 lb 7.1 oz (126.3 kg) 278 lb 10.6 oz (126.4 kg)    Telemetry    Sinus rhythm - Personally Reviewed  Physical Exam   GEN- The patient is chronically il appearing, alert and oriented x 3 today.  Currently receiving HD Head- normocephalic,  atraumatic Eyes-  Sclera clear, conjunctiva pink Ears- hearing intact Oropharynx- clear Neck- supple, Lungs- Clear to ausculation bilaterally, normal work of breathing Heart- Regular rate and rhythm  GI- soft, NT, ND, + BS Extremities- no clubbing, cyanosis, or edema  MS- no significant deformity or atrophy Skin- no rash or lesion Psych- euthymic mood, full affect Neuro- strength and sensation are intact   Labs    Chemistry Recent Labs  Lab 08/17/17 0902  08/20/17 2053 08/21/17 0432 08/22/17 0257  NA 133*   < > 133* 133* 134*  K 4.2   < > 3.6 4.0 3.9  CL 93*   < > 96* 96* 96*  CO2 21*   < > 24 24 24   GLUCOSE 106*   < > 136* 137* 129*  BUN 55*   < > 26* 28* 37*  CREATININE 10.98*   < > 7.03* 7.48* 8.57*  CALCIUM 8.2*   < > 7.5* 7.4* 7.9*  PROT 6.8  --   --   --   --   ALBUMIN 3.4*   < > 3.0* 2.8* 2.9*  AST 24  --   --   --   --   ALT 13*  --   --   --   --  ALKPHOS 90  --   --   --   --   BILITOT 1.1  --   --   --   --   GFRNONAA 5*   < > 8* 7* 6*  GFRAA 5*   < > 9* 8* 7*  ANIONGAP 19*   < > 13 13 14    < > = values in this interval not displayed.     Hematology Recent Labs  Lab 08/20/17 2053 08/21/17 0432 08/22/17 0656  WBC 6.7 4.7 6.2  RBC 2.99* 2.77* 2.89*  HGB 8.7* 8.1* 8.2*  HCT 27.3* 25.2* 25.8*  MCV 91.3 91.0 89.3  MCH 29.1 29.2 28.4  MCHC 31.9 32.1 31.8  RDW 18.9* 18.8* 18.4*  PLT 80* 78* 101*    Cardiac EnzymesNo results for input(s): TROPONINI in the last 168 hours. No results for input(s): TROPIPOC in the last 168 hours.     Patient Profile     57 y.o. male with a hx of NICM, ESRF on HD(hx of failed renal transplant), PAD w/hx of toeamputation, PAFib, P.HTN, chronic hypotension on midodrine, VHD w/mod MS/AS s/p ICD system extraction for MSSA bacteremia by Dr Lovena Le 08/20/17  Assessment & Plan    1.  MSSA bacteremia Doing well s/p ICD system extraction ID managing  2. H/o nonischemic CM EF has recovered I have spoken with Dr  Sallyanne Kuster this am.  There is no documentation (that he is aware of) of appropriate therapy previously from his ICD.  I have also spoken with Dr Lovena Le who feels that due to infectious risks and prior noncompliance that reimplantation in the setting of normalization of EF should be avoided if possible. Will plan to discharge with lifevest.  Follow-up with Dr Sallyanne Kuster in May as scheduled to consider pros and cons of implantation.  3. Paroxysmal atrial fibrillation Currently off anticoagulation.  chads2vasc score is 2.  In the setting of anemia, may be best to avoid anticoagulation currently.  No further inpatient CV workup planned Electrophysiology team to see as needed while here. Please call with questions.   Thompson Grayer MD, [facility] 08/22/2017 9:14 AM

## 2017-08-22 NOTE — Progress Notes (Signed)
ANTICOAGULATION CONSULT NOTE - Follow Up Consult  Pharmacy Consult for warfarin (Afib), cefazolin (MSSA bacteremia)   Allergies  Allergen Reactions  . Enalaprilat Swelling    Mouth swelling  . Vasotec [Enalapril] Swelling    MOUTH SWELLING  . Ivp Dye [Iodinated Diagnostic Agents] Nausea And Vomiting    Patient Measurements: Height: 6\' 1"  (185.4 cm) Weight: (P) 278 lb 10.6 oz (126.4 kg) IBW/kg (Calculated) : 79.9 Heparin Dosing Weight: 106.7 kg  Vital Signs: Temp: (P) 97.5 F (36.4 C) (04/13 1107) Temp Source: (P) Oral (04/13 1107) BP: (P) 85/48 (04/13 1107) Pulse Rate: (P) 78 (04/13 1107)  Labs: Recent Labs    08/20/17 0354  08/20/17 2053 08/21/17 0432 08/22/17 0257 08/22/17 0656  HGB 8.9*   < > 8.7* 8.1*  --  8.2*  HCT 28.0*   < > 27.3* 25.2*  --  25.8*  PLT 78*  --  80* 78*  --  101*  LABPROT 23.8*  --   --  22.5* 26.2*  --   INR 2.14  --   --  1.99 2.43  --   CREATININE 6.11*  --  7.03* 7.48* 8.57*  --    < > = values in this interval not displayed.    Estimated Creatinine Clearance: 13.7 mL/min (A) (by C-G formula based on SCr of 8.57 mg/dL (H)).   Medications:  Scheduled:  . ambrisentan  10 mg Oral Daily  . amiodarone  200 mg Oral QPM  . atorvastatin  40 mg Oral QPM  . calcitRIOL  0.25 mcg Oral Daily  . calcium acetate  1,334 mg Oral TID WC  . Chlorhexidine Gluconate Cloth  6 each Topical Daily  . cinacalcet  90 mg Oral QPM  . darbepoetin (ARANESP) injection - DIALYSIS  150 mcg Intravenous Q Sat-HD  . levothyroxine  300 mcg Oral QAC breakfast  . midodrine  10 mg Oral 3 times per day  . Warfarin - Pharmacist Dosing Inpatient   Does not apply q1800   Infusions:  . sodium chloride    . sodium chloride    . sodium chloride    . sodium chloride 10 mL/hr at 08/21/17 1802  . sodium chloride    .  ceFAZolin (ANCEF) IV Stopped (08/21/17 1802)    Assessment/Plan  1. MSSA bacteremia Today is day 6 of total antibiotics, day 5 since negative blood  cultures. He is improving on cefazolin, currently afebrile and WBC wnl. Per ID planning for 6 weeks of IV therapy. OPAT orders are currently entered based on tentative plan for patient to return to HD schedule of 5 3-hour sessions per week at home.  -Continue cefazolin 1 gram IV q24 hours while inpatient -At discharge: Cefazolin 2 gram IV after each HD session  Antimicrobials this admission: CTX 4/8 x1 Vanc load 4/8 Azith 4/8 >> Cefazolin 4/9 >> (dose missed 4/11) Gentamicin 4/11 x 1 dose (pre-surgery)  Microbiology results: 4/8 BCx: MSSA 2/2 Flu neg 4/9 sputum: pending MRSA PCR: neg 4/9 BCx: - ngtd  2. Atrial fibrillation - on warfarin Patient is on warfarin PTA for Afib, INR up to 4.93 this admission and given vitamin K 5 mg on 4/10. Infected ICD was removed 4/12. Warfarin was restarted yesterday post-ICD removal at home dose. INR increased from 1.99 to 2.43 overnight.  Home warfarin dose: 9 mg daily except 6 mg on Sun -Warfarin 3 mg PO x 1 tonight -Daily INR, CBC -Monitor s/sx of bleeding   Goal of Therapy:  INR 2-3  Monitor platelets by anticoagulation protocol: Yes     Charlene Brooke, PharmD PGY1 Pharmacy Resident Phone: [phone] After 3:30PM please call Main Pharmacy [phone] 08/22/2017,12:43 PM

## 2017-08-22 NOTE — Progress Notes (Signed)
PROGRESS NOTE    Adam Alm.   JYN:[id number]  DOB: 18-Jun-1960  DOA: 08/17/2017 PCP: [name], MD   Brief Narrative:  Adam Alm. is a 57 y/o with ESRD on home hemodialysis on Monday through Friday for the past year, atrial fibrillation on Coumadin, chronic hypertension, nonischemic cardiomyopathy with AICD, pulmonary hypertension, moderate aortic stenosis He has been feeling well up until one day prior to admission when he developed a fever (102.8) and dyspnea.  Apparently his wife stated that he had felt warm for a few days and may have been having fevers and not realizing it.  He has been doing his dialysis and his fistula has felt fine.  He had a dry cough for 1 week. He was admitted at [facility] in transfer to [facility] for ongoing dialysis.  Blood cultures became positive for MSSA. He received a fluid bolus yesterday and albumin infusion this morning (by nephrology) for hypotension. He is also on midodrine at home.  Subjective: No new complaints from those already reported.  Assessment & Plan:   Principal Problem:   MSSA bacteremia with sepsis (fevers leukocytosis hypotension) - In setting of AICD - Appreciate management by ID- Currently on Ancef and Gentamycin - Pt is s/p removal of defibillator - Pt evaluated by vascular who is recommending IV antibiotics for now that patient has had defibrillator removed. As mentioned patient currently in observation phase.   Active Problems:    Atrial fibrillation-Elevated INR - continue patient on Amiodarone - pharmacy managing coumadin  Acute on chronic thrombocytopenia - It appears that platelets have been between 101- 150 in the past - INR is elevated and not on any other anticoagulation - Continue bleeding precautions   ESRD/acute on chronic hypotension - on dialysis Monday through Friday and midodrine - Being managed by nephrology - see fistula discussion above.   Severe pulmonary  hypertension Tadalafil is being replaced with Cialis-we will need to hold this due to hypotension -Resume ambrisentan today  Hyperlipidemia -Continue statin  Hypothyroidism Synthroid  DVT prophylaxis: INR elevated-Coumadin Code Status: Full code Family Communication: discussed with patient and family member at bedside.  Disposition Plan: Follow-up on MSSA bacteremia and hypotension- Consultants:   ID  Nephrology Procedures:    Antimicrobials:  Anti-infectives (From admission, onward)   Start     Dose/Rate Route Frequency Ordered Stop   08/20/17 1300  gentamicin (GARAMYCIN) 80 mg in sodium chloride 0.9 % 500 mL irrigation     80 mg Irrigation To ShortStay Surgical 08/19/17 1351 08/20/17 1511   08/20/17 1200  ceFAZolin (ANCEF) 3 g in dextrose 5 % 50 mL IVPB  Status:  Discontinued     3 g 130 mL/hr over 30 Minutes Intravenous To ShortStay Surgical 08/19/17 1351 08/20/17 1954   08/18/17 1800  ceFAZolin (ANCEF) IVPB 1 g/50 mL premix     1 g 100 mL/hr over 30 Minutes Intravenous Every 24 hours 08/18/17 0035     08/18/17 1000  cefTRIAXone (ROCEPHIN) 1 g in sodium chloride 0.9 % 100 mL IVPB  Status:  Discontinued     1 g 200 mL/hr over 30 Minutes Intravenous Every 24 hours 08/17/17 1151 08/18/17 0035   08/18/17 0100  ceFAZolin (ANCEF) IVPB 2g/100 mL premix     2 g 200 mL/hr over 30 Minutes Intravenous  Once 08/18/17 0035 08/18/17 0127   08/17/17 1600  azithromycin (ZITHROMAX) tablet 500 mg  Status:  Discontinued     500 mg Oral Daily 08/17/17  1505 08/18/17 1053   08/17/17 1200  vancomycin (VANCOCIN) 2,000 mg in sodium chloride 0.9 % 500 mL IVPB     2,000 mg 250 mL/hr over 120 Minutes Intravenous  Once 08/17/17 1144 08/17/17 1526   08/17/17 1115  cefTRIAXone (ROCEPHIN) 1 g in sodium chloride 0.9 % 100 mL IVPB     1 g 200 mL/hr over 30 Minutes Intravenous  Once 08/17/17 1111 08/17/17 1313   08/17/17 1115  azithromycin (ZITHROMAX) tablet 500 mg     500 mg Oral  Once 08/17/17 1111  08/17/17 1139       Objective: Vitals:   08/22/17 1030 08/22/17 1100 08/22/17 1107 08/22/17 1200  BP: (!) 88/53 (!) 82/50 (!) 85/48 94/60  Pulse: 71 72 78 72  Resp: 17 18 17  (!) 23  Temp:   (!) 97.5 F (36.4 C) (!) 97.3 F (36.3 C)  TempSrc:   Oral Oral  SpO2: 95% 95% 95% 100%  Weight:   126.4 kg (278 lb 10.6 oz)   Height:        Intake/Output Summary (Last 24 hours) at 08/22/2017 1317 Last data filed at 08/22/2017 1107 Gross per 24 hour  Intake 70 ml  Output 1 ml  Net 69 ml   Filed Weights   08/22/17 0440 08/22/17 0755 08/22/17 1107  Weight: 126.3 kg (278 lb 7.1 oz) 126.4 kg (278 lb 10.6 oz) 126.4 kg (278 lb 10.6 oz)    Examination: General exam: Pt in nad, alert and awake.  HEENT: PERRLA, oral mucosa moist, no sclera icterus or thrush Respiratory system: Clear to auscultation.  Has a dry cough respiratory effort normal. Cardiovascular system: S1 & S2 heard, RRR. No rubs   Gastrointestinal system: Abdomen soft, non-tender, nondistended. Normal bowel sound. No organomegaly Central nervous system: Alert and oriented. No focal neurological deficits. Extremities: No cyanosis, clubbing or edema Skin: No rashes or ulcers Psychiatry:  Mood & affect appropriate.   Data Reviewed: I have personally reviewed following labs and imaging studies  CBC: Recent Labs  Lab 08/17/17 0902  08/19/17 0249 08/20/17 0354 08/20/17 1655 08/20/17 2053 08/21/17 0432 08/22/17 0656  WBC 12.0*   < > 7.9 7.2  --  6.7 4.7 6.2  NEUTROABS 11.1*  --   --   --   --   --   --   --   HGB 10.5*   < > 9.5* 8.9* 11.6* 8.7* 8.1* 8.2*  HCT 33.0*   < > 30.0* 28.0* 34.0* 27.3* 25.2* 25.8*  MCV 90.7   < > 90.1 92.1  --  91.3 91.0 89.3  PLT 78*   < > 60* 78*  --  80* 78* 101*   < > = values in this interval not displayed.   Basic Metabolic Panel: Recent Labs  Lab 08/19/17 0249 08/20/17 0354 08/20/17 1655 08/20/17 2053 08/21/17 0432 08/22/17 0257  NA 134* 133* 134* 133* 133* 134*  K 3.6 3.8 3.7  3.6 4.0 3.9  CL 94* 95*  --  96* 96* 96*  CO2 25 26  --  24 24 24   GLUCOSE 96 90  --  136* 137* 129*  BUN 33* 19  --  26* 28* 37*  CREATININE 8.56* 6.11*  --  7.03* 7.48* 8.57*  CALCIUM 7.8* 7.8*  --  7.5* 7.4* 7.9*  PHOS 3.8 2.3*  --  2.9 3.7 3.3   GFR: Estimated Creatinine Clearance: 13.7 mL/min (A) (by C-G formula based on SCr of 8.57 mg/dL (H)). Liver Function Tests:  Recent Labs  Lab 08/17/17 0902  08/19/17 0249 08/20/17 0354 08/20/17 2053 08/21/17 0432 08/22/17 0257  AST 24  --   --   --   --   --   --   ALT 13*  --   --   --   --   --   --   ALKPHOS 90  --   --   --   --   --   --   BILITOT 1.1  --   --   --   --   --   --   PROT 6.8  --   --   --   --   --   --   ALBUMIN 3.4*   < > 2.9* 3.1* 3.0* 2.8* 2.9*   < > = values in this interval not displayed.   No results for input(s): LIPASE, AMYLASE in the last 168 hours. No results for input(s): AMMONIA in the last 168 hours. Coagulation Profile: Recent Labs  Lab 08/19/17 0249 08/19/17 1600 08/20/17 0354 08/21/17 0432 08/22/17 0257  INR 4.93* 3.34 2.14 1.99 2.43   Cardiac Enzymes: No results for input(s): CKTOTAL, CKMB, CKMBINDEX, TROPONINI in the last 168 hours. BNP (last 3 results) No results for input(s): PROBNP in the last 8760 hours. HbA1C: No results for input(s): HGBA1C in the last 72 hours. CBG: No results for input(s): GLUCAP in the last 168 hours. Lipid Profile: No results for input(s): CHOL, HDL, LDLCALC, TRIG, CHOLHDL, LDLDIRECT in the last 72 hours. Thyroid Function Tests: No results for input(s): TSH, T4TOTAL, FREET4, T3FREE, THYROIDAB in the last 72 hours. Anemia Panel: No results for input(s): VITAMINB12, FOLATE, FERRITIN, TIBC, IRON, RETICCTPCT in the last 72 hours. Urine analysis: No results found for: COLORURINE, APPEARANCEUR, LABSPEC, PHURINE, GLUCOSEU, HGBUR, BILIRUBINUR, KETONESUR, PROTEINUR, UROBILINOGEN, NITRITE, LEUKOCYTESUR Sepsis  Labs: @LABRCNTIP (procalcitonin:4,lacticidven:4) ) Recent Results (from the past 240 hour(s))  Blood culture (routine x 2)     Status: Abnormal   Collection Time: 08/17/17  9:22 AM  Result Value Ref Range Status   Specimen Description   Final    BLOOD RIGHT FOREARM Performed at [facility] [address] [place], [place] [address]    Special Requests   Final    BOTTLES DRAWN AEROBIC AND ANAEROBIC Blood Culture adequate volume Performed at [facility] [address], [place], [place] [address]    Culture  Setup Time   Final    GRAM POSITIVE COCCI IN BOTH AEROBIC AND ANAEROBIC BOTTLES Organism ID to follow CRITICAL RESULT CALLED TO, READ BACK BY AND VERIFIED WITH: A MASTERS PHARMD 08/18/17 0009 JDW Performed at [facility], [place] [address] [place], [place] [address]    Culture STAPHYLOCOCCUS AUREUS (A)  Final   Report Status 08/20/2017 FINAL  Final   Organism ID, Bacteria STAPHYLOCOCCUS AUREUS  Final      Susceptibility   Staphylococcus aureus - MIC*    CIPROFLOXACIN <=0.5 SENSITIVE Sensitive     ERYTHROMYCIN >=8 RESISTANT Resistant     GENTAMICIN <=0.5 SENSITIVE Sensitive     OXACILLIN 0.5 SENSITIVE Sensitive     TETRACYCLINE <=1 SENSITIVE Sensitive     VANCOMYCIN <=0.5 SENSITIVE Sensitive     TRIMETH/SULFA <=10 SENSITIVE Sensitive     CLINDAMYCIN <=0.25 SENSITIVE Sensitive     RIFAMPIN <=0.5 SENSITIVE Sensitive     Inducible Clindamycin NEGATIVE Sensitive     * STAPHYLOCOCCUS AUREUS  Blood Culture ID Panel (Reflexed)     Status:  Abnormal   Collection Time: 08/17/17  9:22 AM  Result Value Ref Range Status   Enterococcus species NOT DETECTED NOT DETECTED Final   Listeria monocytogenes NOT DETECTED NOT DETECTED Final   Staphylococcus species DETECTED (A) NOT DETECTED Final    Comment: CRITICAL RESULT CALLED TO, READ BACK BY AND VERIFIED WITH: A MASTERS PHARMD 08/18/17 0009 JDW    Staphylococcus aureus DETECTED (A) NOT DETECTED  Final    Comment: Methicillin (oxacillin) susceptible Staphylococcus aureus (MSSA). Preferred therapy is anti staphylococcal beta lactam antibiotic (Cefazolin or Nafcillin), unless clinically contraindicated. CRITICAL RESULT CALLED TO, READ BACK BY AND VERIFIED WITH: A MASTERS PHARMD 08/18/17 0009 JDW    Methicillin resistance NOT DETECTED NOT DETECTED Final   Streptococcus species NOT DETECTED NOT DETECTED Final   Streptococcus agalactiae NOT DETECTED NOT DETECTED Final   Streptococcus pneumoniae NOT DETECTED NOT DETECTED Final   Streptococcus pyogenes NOT DETECTED NOT DETECTED Final   Acinetobacter baumannii NOT DETECTED NOT DETECTED Final   Enterobacteriaceae species NOT DETECTED NOT DETECTED Final   Enterobacter cloacae complex NOT DETECTED NOT DETECTED Final   Escherichia coli NOT DETECTED NOT DETECTED Final   Klebsiella oxytoca NOT DETECTED NOT DETECTED Final   Klebsiella pneumoniae NOT DETECTED NOT DETECTED Final   Proteus species NOT DETECTED NOT DETECTED Final   Serratia marcescens NOT DETECTED NOT DETECTED Final   Haemophilus influenzae NOT DETECTED NOT DETECTED Final   Neisseria meningitidis NOT DETECTED NOT DETECTED Final   Pseudomonas aeruginosa NOT DETECTED NOT DETECTED Final   Candida albicans NOT DETECTED NOT DETECTED Final   Candida glabrata NOT DETECTED NOT DETECTED Final   Candida krusei NOT DETECTED NOT DETECTED Final   Candida parapsilosis NOT DETECTED NOT DETECTED Final   Candida tropicalis NOT DETECTED NOT DETECTED Final    Comment: Performed at [facility], [place]. [address] [place], [place] [address]  Blood culture (routine x 2)     Status: Abnormal   Collection Time: 08/17/17  9:52 AM  Result Value Ref Range Status   Specimen Description   Final    BLOOD RIGHT FOREARM Performed at [facility] [address] [place], [place] [address]    Special Requests   Final    BOTTLES DRAWN AEROBIC AND ANAEROBIC Blood Culture adequate  volume Performed at [facility] [address] [place], [place] [address]    Culture  Setup Time   Final    GRAM POSITIVE COCCI IN BOTH AEROBIC AND ANAEROBIC BOTTLES CRITICAL RESULT CALLED TO, READ BACK BY AND VERIFIED WITH: A MASTERS PHARMD 08/18/17 00009 JDW    Culture (A)  Final    STAPHYLOCOCCUS AUREUS SUSCEPTIBILITIES PERFORMED ON PREVIOUS CULTURE WITHIN THE LAST 5 DAYS. Performed at [facility], [place] [address] [place], [place] [address]    Report Status 08/20/2017 FINAL  Final  MRSA PCR Screening     Status: None   Collection Time: 08/17/17  8:40 PM  Result Value Ref Range Status   MRSA by PCR NEGATIVE NEGATIVE Final    Comment:        The GeneXpert MRSA Assay (FDA approved for NASAL specimens only), is one component of a comprehensive MRSA colonization surveillance program. It is not intended to diagnose MRSA infection nor to guide or monitor treatment for MRSA infections. Performed at [facility], [place] [address], [place], [place] [address]   Culture, blood (Routine X 2) w Reflex to ID Panel     Status: None (  Preliminary result)   Collection Time: 08/18/17  3:05 AM  Result Value Ref Range Status   Specimen Description BLOOD RIGHT HAND  Final   Special Requests   Final    BOTTLES DRAWN AEROBIC ONLY Blood Culture adequate volume   Culture   Final    NO GROWTH 3 DAYS Performed at [facility], [address], [place], [place] [address]    Report Status PENDING  Incomplete  Culture, blood (Routine X 2) w Reflex to ID Panel     Status: None (Preliminary result)   Collection Time: 08/18/17  3:05 AM  Result Value Ref Range Status   Specimen Description BLOOD RIGHT HAND  Final   Special Requests   Final    BOTTLES DRAWN AEROBIC ONLY Blood Culture adequate volume   Culture   Final    NO GROWTH 3 DAYS Performed at [facility], [place] [address], [place], [place] [address]    Report Status PENDING  Incomplete  Culture,  sputum-assessment     Status: None   Collection Time: 08/18/17  9:32 PM  Result Value Ref Range Status   Specimen Description EXPECTORATED SPUTUM  Final   Special Requests NONE  Final   Sputum evaluation   Final    RESULT CALLED TO, READ BACK BY AND VERIFIED WITH: Miguel Dibble RN [id number] 08/18/17 A BROWNING Performed at [facility], [place] [address], [place], [place] [address]    Report Status 08/18/2017 FINAL  Final  Surgical PCR screen     Status: Abnormal   Collection Time: 08/20/17 10:11 AM  Result Value Ref Range Status   MRSA, PCR NEGATIVE NEGATIVE Final   Staphylococcus aureus POSITIVE (A) NEGATIVE Final    Comment: (NOTE) The Xpert SA Assay (FDA approved for NASAL specimens in patients 22 years of age and older), is one component of a comprehensive surveillance program. It is not intended to diagnose infection nor to guide or monitor treatment. Performed at [facility], [place] [address] [place],  [address]          Radiology Studies: Dg Chest 2 View  Result Date: 08/21/2017 CLINICAL DATA:  Post op removal of pacemaker yesterday - no chest complaints, just soreness - pt unable to raise right arm for lateral view EXAM: CHEST - 2 VIEW COMPARISON:  Chest x-ray dated 08/17/2017. Chest x-ray dated 11/11/2016. FINDINGS: Status post RIGHT chest wall pacemaker removal. Stable cardiomegaly. Aortic atherosclerosis. Stable chronic interstitial prominence. No new lung findings. No pleural effusion or pneumothorax seen. IMPRESSION: 1. Status post RIGHT chest wall pacemaker removal. No pleural effusion or pneumothorax seen. 2. Stable cardiomegaly. 3. Stable chronic interstitial prominence, likely chronic mild CHF/volume overload. 4. Aortic atherosclerosis. Electronically Signed   By: Franki Cabot M.D.   On: 08/21/2017 09:06   Dg C-arm 1-60 Min-no Report  Result Date: 08/20/2017 Fluoroscopy was utilized by the requesting physician.  No radiographic interpretation.   Dg  C-arm 1-60 Min-no Report  Result Date: 08/20/2017 Fluoroscopy was utilized by the requesting physician.  No radiographic interpretation.      Scheduled Meds: . ambrisentan  10 mg Oral Daily  . amiodarone  200 mg Oral QPM  . atorvastatin  40 mg Oral QPM  . calcitRIOL  0.25 mcg Oral Daily  . calcium acetate  1,334 mg Oral TID WC  . Chlorhexidine Gluconate Cloth  6 each Topical Daily  . cinacalcet  90 mg Oral QPM  . darbepoetin (ARANESP) injection - DIALYSIS  150 mcg Intravenous Q Sat-HD  . levothyroxine  300 mcg Oral QAC breakfast  . midodrine  10 mg Oral 3 times per day  . Warfarin - Pharmacist Dosing Inpatient   Does not apply q1800   Continuous Infusions: . sodium chloride    . sodium chloride    . sodium chloride    . sodium chloride 10 mL/hr at 08/21/17 1802  . sodium chloride    .  ceFAZolin (ANCEF) IV Stopped (08/21/17 1802)     LOS: 5 days    Time spent in minutes: [place], MD [facility] Pager:[phone] [email or web address] Password [email or web address] 08/22/2017, 1:17 PM

## 2017-08-22 NOTE — Progress Notes (Signed)
   Patient currently on dialysis via left arm AV fistula.  There is some notable swelling in the antecubital but I do not sense any warmth or erythema.  Patient cannot definitively rule out infectious source but at this time we will continue watchful waiting.  Canton Yearby C. Donzetta Matters, MD [facility] Office: [phone] Pager: [phone]

## 2017-08-22 NOTE — Progress Notes (Signed)
Subjective:  No new c/o, on HD now  Objective Vital signs in last 24 hours: Vitals:   08/22/17 0440 08/22/17 0755 08/22/17 0807 08/22/17 0813  BP: (!) 79/56 (!) 84/47 (!) 84/51 (!) 79/50  Pulse: 75 71 70 71  Resp: 17 16 15 18   Temp: (!) 97.3 F (36.3 C) (!) 97.5 F (36.4 C)    TempSrc: Oral Oral    SpO2: 93% 99% 100% 96%  Weight: 126.3 kg (278 lb 7.1 oz) 126.4 kg (278 lb 10.6 oz)    Height:       Weight change: -2.4 kg (-5 lb 4.7 oz)  Intake/Output Summary (Last 24 hours) at 08/22/2017 0857 Last data filed at 08/21/2017 1802 Gross per 24 hour  Intake 190 ml  Output 1 ml  Net 189 ml    Assessment/Plan: 57 year old BM with multiple medical problems including ESRD- Presenting with fever and shortness of breath with elevated white count and low blood pressure  Problems: 1 MSSA bacteremia - possible ICD infection, sp ICD removal 4/12. Defervesced on IV Ancef, f/u blood cx's 4/9 are neg x 2.  Swollen area L AVF felt to be hematoma per VVS and they would not do anything about this unless he shows signs of worsening infection.   2 ESRD: Normally does home hemodialysis 3 hrs 5 days per week, to run 3 hours today. 3 Hypertension/ vol : Patient is hypotensive at baseline.  Runs in 90's and 80's on HD at home, feels "bad" in the 70's.  At dry wt today.  4 Anemia of ESRD: Hemoglobin here 10.4, now 8.1. - recently given venofer as OP- mircera 225 given 3/28, will redose tomorrow.  Nothing here yet- INR is up- coumadin on hold- parameters for transfusion tomorrow if hgb less than 7.5  5. Metabolic Bone Disease: Is on sensipar 90 mg daily, phoslo 2-4 TID and calcitriol 0.25 daily - continue here 6  L AVF hematoma - seen by VVS, not likely infectious but follow exam and if signs of infection arise may need AVF removal.  Improving per patient.      Kelly Splinter MD [employer] pgr [phone]   08/22/2017, 9:04 AM        Labs: Basic Metabolic Panel: Recent Labs  Lab  08/20/17 2053 08/21/17 0432 08/22/17 0257  NA 133* 133* 134*  K 3.6 4.0 3.9  CL 96* 96* 96*  CO2 24 24 24   GLUCOSE 136* 137* 129*  BUN 26* 28* 37*  CREATININE 7.03* 7.48* 8.57*  CALCIUM 7.5* 7.4* 7.9*  PHOS 2.9 3.7 3.3   Liver Function Tests: Recent Labs  Lab 08/17/17 0902  08/20/17 2053 08/21/17 0432 08/22/17 0257  AST 24  --   --   --   --   ALT 13*  --   --   --   --   ALKPHOS 90  --   --   --   --   BILITOT 1.1  --   --   --   --   PROT 6.8  --   --   --   --   ALBUMIN 3.4*   < > 3.0* 2.8* 2.9*   < > = values in this interval not displayed.   No results for input(s): LIPASE, AMYLASE in the last 168 hours. No results for input(s): AMMONIA in the last 168 hours. CBC: Recent Labs  Lab 08/17/17 0902  08/19/17 0249 08/20/17 0354  08/20/17 2053 08/21/17 2426 08/22/17 8341  WBC 12.0*   < > 7.9 7.2  --  6.7 4.7 6.2  NEUTROABS 11.1*  --   --   --   --   --   --   --   HGB 10.5*   < > 9.5* 8.9*   < > 8.7* 8.1* 8.2*  HCT 33.0*   < > 30.0* 28.0*   < > 27.3* 25.2* 25.8*  MCV 90.7   < > 90.1 92.1  --  91.3 91.0 89.3  PLT 78*   < > 60* 78*  --  80* 78* 101*   < > = values in this interval not displayed.   Cardiac Enzymes: No results for input(s): CKTOTAL, CKMB, CKMBINDEX, TROPONINI in the last 168 hours. CBG: No results for input(s): GLUCAP in the last 168 hours.  Iron Studies: No results for input(s): IRON, TIBC, TRANSFERRIN, FERRITIN in the last 72 hours. Studies/Results: Dg Chest 2 View  Result Date: 08/21/2017 CLINICAL DATA:  Post op removal of pacemaker yesterday - no chest complaints, just soreness - pt unable to raise right arm for lateral view EXAM: CHEST - 2 VIEW COMPARISON:  Chest x-ray dated 08/17/2017. Chest x-ray dated 11/11/2016. FINDINGS: Status post RIGHT chest wall pacemaker removal. Stable cardiomegaly. Aortic atherosclerosis. Stable chronic interstitial prominence. No new lung findings. No pleural effusion or pneumothorax seen. IMPRESSION: 1. Status  post RIGHT chest wall pacemaker removal. No pleural effusion or pneumothorax seen. 2. Stable cardiomegaly. 3. Stable chronic interstitial prominence, likely chronic mild CHF/volume overload. 4. Aortic atherosclerosis. Electronically Signed   By: Franki Cabot M.D.   On: 08/21/2017 09:06   Dg C-arm 1-60 Min-no Report  Result Date: 08/20/2017 Fluoroscopy was utilized by the requesting physician.  No radiographic interpretation.   Dg C-arm 1-60 Min-no Report  Result Date: 08/20/2017 Fluoroscopy was utilized by the requesting physician.  No radiographic interpretation.   Medications: Infusions: . sodium chloride    . sodium chloride    . sodium chloride    . sodium chloride 10 mL/hr at 08/21/17 1802  . sodium chloride    .  ceFAZolin (ANCEF) IV Stopped (08/21/17 1802)    Scheduled Medications: . ambrisentan  10 mg Oral Daily  . amiodarone  200 mg Oral QPM  . atorvastatin  40 mg Oral QPM  . calcitRIOL  0.25 mcg Oral Daily  . calcium acetate  1,334 mg Oral TID WC  . Chlorhexidine Gluconate Cloth  6 each Topical Daily  . cinacalcet  90 mg Oral QPM  . darbepoetin (ARANESP) injection - DIALYSIS  150 mcg Intravenous Q Sat-HD  . levothyroxine  300 mcg Oral QAC breakfast  . midodrine  10 mg Oral 3 times per day  . Warfarin - Pharmacist Dosing Inpatient   Does not apply q1800    have reviewed scheduled and prn medications.  Physical Exam: General: alert Heart: RRR- no murmer Lungs: mostly clear Abdomen: soft, non tender Extremities: mild edema Dialysis Access: left upper AVF w swollen area mid AVF, tender but not red or warm   08/22/2017,8:57 AM  LOS: 5 days

## 2017-08-22 NOTE — Progress Notes (Signed)
Patient returned from dialysis. Angelo from dialysis reports that no fluids were taken off of patient and they cleaned blood only. Patient is alert and oriented and VS are stable. Patient denies any pain. He is resting comfortably in bed. Will continue to monitor patient.

## 2017-08-23 LAB — TYPE AND SCREEN
ABO/RH(D): O POS
Antibody Screen: NEGATIVE
UNIT DIVISION: 0
Unit division: 0
Unit division: 0
Unit division: 0

## 2017-08-23 LAB — BPAM RBC
BLOOD PRODUCT EXPIRATION DATE: 201905062359
Blood Product Expiration Date: 201905072359
Blood Product Expiration Date: 201905082359
Blood Product Expiration Date: 201905082359
ISSUE DATE / TIME: 201904121253
ISSUE DATE / TIME: 201904121253
UNIT TYPE AND RH: 5100
Unit Type and Rh: 5100
Unit Type and Rh: 5100
Unit Type and Rh: 5100

## 2017-08-23 LAB — RENAL FUNCTION PANEL
ANION GAP: 11 (ref 5–15)
Albumin: 2.7 g/dL — ABNORMAL LOW (ref 3.5–5.0)
BUN: 25 mg/dL — ABNORMAL HIGH (ref 6–20)
CO2: 27 mmol/L (ref 22–32)
Calcium: 7.7 mg/dL — ABNORMAL LOW (ref 8.9–10.3)
Chloride: 98 mmol/L — ABNORMAL LOW (ref 101–111)
Creatinine, Ser: 6.59 mg/dL — ABNORMAL HIGH (ref 0.61–1.24)
GFR calc Af Amer: 10 mL/min — ABNORMAL LOW (ref >60)
GFR calc non Af Amer: 9 mL/min — ABNORMAL LOW (ref >60)
GLUCOSE: 84 mg/dL (ref 65–99)
POTASSIUM: 4 mmol/L (ref 3.5–5.1)
Phosphorus: 2.5 mg/dL (ref 2.5–4.6)
Sodium: 136 mmol/L (ref 135–145)

## 2017-08-23 LAB — CULTURE, BLOOD (ROUTINE X 2)
Culture: NO GROWTH
Culture: NO GROWTH
Special Requests: ADEQUATE
Special Requests: ADEQUATE

## 2017-08-23 LAB — PROTIME-INR
INR: 2.34
Prothrombin Time: 25.4 seconds — ABNORMAL HIGH (ref 11.4–15.2)

## 2017-08-23 MED ORDER — WARFARIN SODIUM 3 MG PO TABS: 6.0000 mg | ORAL_TABLET | Freq: Once | ORAL | Status: DC

## 2017-08-23 MED ORDER — PHYTONADIONE 5 MG PO TABS
2.5000 mg | ORAL_TABLET | Freq: Once | ORAL | Status: AC
  Administered 2017-08-23: 2.5 mg via ORAL
  Filled 2017-08-23: qty 1

## 2017-08-23 NOTE — Progress Notes (Signed)
ANTICOAGULATION CONSULT NOTE - Follow Up Consult  Pharmacy Consult for warfarin (Afib)   Allergies  Allergen Reactions  . Enalaprilat Swelling    Mouth swelling  . Vasotec [Enalapril] Swelling    MOUTH SWELLING  . Ivp Dye [Iodinated Diagnostic Agents] Nausea And Vomiting    Patient Measurements: Height: 6\' 1"  (185.4 cm) Weight: 278 lb 3.5 oz (126.2 kg) IBW/kg (Calculated) : 79.9 Heparin Dosing Weight: 106.7 kg  Vital Signs: Temp: 97.7 F (36.5 C) (04/14 0452) Temp Source: Oral (04/14 0452) BP: 77/54 (04/14 0452)  Labs: Recent Labs    08/20/17 2053 08/21/17 0432 08/22/17 0257 08/22/17 0656 08/23/17 0410  HGB 8.7* 8.1*  --  8.2*  --   HCT 27.3* 25.2*  --  25.8*  --   PLT 80* 78*  --  101*  --   LABPROT  --  22.5* 26.2*  --  25.4*  INR  --  1.99 2.43  --  2.34  CREATININE 7.03* 7.48* 8.57*  --  6.59*    Estimated Creatinine Clearance: 17.8 mL/min (A) (by C-G formula based on SCr of 6.59 mg/dL (H)).   Medications:  Scheduled:  . ambrisentan  10 mg Oral Daily  . amiodarone  200 mg Oral QPM  . atorvastatin  40 mg Oral QPM  . calcitRIOL  0.25 mcg Oral Daily  . calcium acetate  1,334 mg Oral TID WC  . Chlorhexidine Gluconate Cloth  6 each Topical Daily  . cinacalcet  90 mg Oral QPM  . darbepoetin (ARANESP) injection - DIALYSIS  150 mcg Intravenous Q Sat-HD  . levothyroxine  300 mcg Oral QAC breakfast  . midodrine  10 mg Oral 3 times per day  . Warfarin - Pharmacist Dosing Inpatient   Does not apply q1800   Infusions:  . sodium chloride    . sodium chloride 10 mL/hr at 08/21/17 1802  . sodium chloride    .  ceFAZolin (ANCEF) IV 1 g (08/22/17 1827)    Assessment/Plan Patient is on warfarin PTA for Afib, INR up to 4.93 this admission and given vitamin K 5 mg on 4/10. Infected ICD was removed 4/12.   Vascular planning possible procedure on fistula on 4/15. Due to that planning to hold warfarin this evening. INR 2.34.  Goal of Therapy:  INR 2-3 Monitor  platelets by anticoagulation protocol: Yes   Plan: Hold warfarin this evening  Daily INR  Follow up on vascular interventions   Vincenza Hews, PharmD, BCPS 08/23/2017, 11:12 AM

## 2017-08-23 NOTE — Progress Notes (Signed)
Subjective:  No new c/o, on HD now  Objective Vital signs in last 24 hours: Vitals:   08/22/17 2145 08/22/17 2320 08/23/17 0452 08/23/17 1151  BP: (!) 76/56 (!) 78/48 (!) 77/54 (!) 86/65  Pulse: 87   (!) 36  Resp: 18 15 (!) 23 13  Temp: (!) 97.5 F (36.4 C) 97.6 F (36.4 C) 97.7 F (36.5 C) (!) 97.4 F (36.3 C)  TempSrc: Oral Oral Oral Oral  SpO2: 100% 94% 93% 94%  Weight:   126.2 kg (278 lb 3.5 oz)   Height:       Weight change: 0.1 kg (3.5 oz)  Intake/Output Summary (Last 24 hours) at 08/23/2017 1329 Last data filed at 08/23/2017 1241 Gross per 24 hour  Intake 240 ml  Output -  Net 240 ml    Assessment/Plan: 57 year old BM with multiple medical problems including ESRD- Presenting with fever and shortness of breath with elevated white count and low blood pressure  Problems: 1 MSSA bacteremia - possible ICD infection, sp ICD removal 4/12. Defervesced on IV Ancef, f/u blood cx's 4/9 neg x 2.  VVS concerned about the L upper arm AVF, (fractured stent/ pseudoaneurysm/ hematoma), plan is keep NPO after MN and correct INR in case needs surgery tomorrow 2 ESRD: on home HD 3 hrs 5 days per week. Dry wt ~125kg.  Ran 3hr on Sat. Tentatively could have HD tomorrow , depends on surgery plans 3 Hypertension/ vol : Patient has hypotension at baseline.  BP runs in 90's and 80's on HD at home per pt, at dry wt today (~125kg).   4 Anemia of ESRD: Hemoglobin stable low 8's here.  Recently given venofer as OP- mircera 225 given 3/28, sp darbe 150 ug here 4/13, cont darbe 150 q Friday. 5. Metabolic Bone Disease: Is on sensipar 90 mg daily, phoslo 2-4 TID and calcitriol 0.25 daily    Kelly Splinter MD [facility] pgr [phone]   08/23/2017, 1:29 PM        Labs: Basic Metabolic Panel: Recent Labs  Lab 08/21/17 0432 08/22/17 0257 08/23/17 0410  NA 133* 134* 136  K 4.0 3.9 4.0  CL 96* 96* 98*  CO2 24 24 27   GLUCOSE 137* 129* 84  BUN 28* 37* 25*  CREATININE 7.48*  8.57* 6.59*  CALCIUM 7.4* 7.9* 7.7*  PHOS 3.7 3.3 2.5   Liver Function Tests: Recent Labs  Lab 08/17/17 0902  08/21/17 0432 08/22/17 0257 08/23/17 0410  AST 24  --   --   --   --   ALT 13*  --   --   --   --   ALKPHOS 90  --   --   --   --   BILITOT 1.1  --   --   --   --   PROT 6.8  --   --   --   --   ALBUMIN 3.4*   < > 2.8* 2.9* 2.7*   < > = values in this interval not displayed.   No results for input(s): LIPASE, AMYLASE in the last 168 hours. No results for input(s): AMMONIA in the last 168 hours. CBC: Recent Labs  Lab 08/17/17 0902  08/19/17 0249 08/20/17 0354  08/20/17 2053 08/21/17 0432 08/22/17 0656  WBC 12.0*   < > 7.9 7.2  --  6.7 4.7 6.2  NEUTROABS 11.1*  --   --   --   --   --   --   --  HGB 10.5*   < > 9.5* 8.9*   < > 8.7* 8.1* 8.2*  HCT 33.0*   < > 30.0* 28.0*   < > 27.3* 25.2* 25.8*  MCV 90.7   < > 90.1 92.1  --  91.3 91.0 89.3  PLT 78*   < > 60* 78*  --  80* 78* 101*   < > = values in this interval not displayed.   Cardiac Enzymes: No results for input(s): CKTOTAL, CKMB, CKMBINDEX, TROPONINI in the last 168 hours. CBG: No results for input(s): GLUCAP in the last 168 hours.  Iron Studies: No results for input(s): IRON, TIBC, TRANSFERRIN, FERRITIN in the last 72 hours. Studies/Results: No results found. Medications: Infusions: . sodium chloride    . sodium chloride 10 mL/hr at 08/21/17 1802  . sodium chloride    .  ceFAZolin (ANCEF) IV 1 g (08/22/17 1827)    Scheduled Medications: . ambrisentan  10 mg Oral Daily  . amiodarone  200 mg Oral QPM  . atorvastatin  40 mg Oral QPM  . calcitRIOL  0.25 mcg Oral Daily  . calcium acetate  1,334 mg Oral TID WC  . Chlorhexidine Gluconate Cloth  6 each Topical Daily  . cinacalcet  90 mg Oral QPM  . darbepoetin (ARANESP) injection - DIALYSIS  150 mcg Intravenous Q Sat-HD  . levothyroxine  300 mcg Oral QAC breakfast  . midodrine  10 mg Oral 3 times per day  . phytonadione  2.5 mg Oral Once  . Warfarin  - Pharmacist Dosing Inpatient   Does not apply q1800    have reviewed scheduled and prn medications.  Physical Exam: General: alert Heart: RRR- no murmer Lungs: mostly clear Abdomen: soft, non tender Extremities: mild edema Dialysis Access: left upper AVF w swollen area mid AVF, tender but not red or warm   08/23/2017,1:29 PM  LOS: 6 days

## 2017-08-23 NOTE — Progress Notes (Signed)
  Progress Note    08/23/2017 11:01 AM 3 Days Post-Op  Subjective: Feeling okay today  Vitals:   08/22/17 2320 08/23/17 0452  BP: (!) 78/48 (!) 77/54  Pulse:    Resp: 15 (!) 23  Temp: 97.6 F (36.4 C) 97.7 F (36.5 C)  SpO2: 94% 93%    Physical Exam:  awake alert and oriented There is palpable thrill proximally in the fistula just above the antecubitum Skin is very thin between the 2 cannulation sites   CBC    Component Value Date/Time   WBC 6.2 08/22/2017 0656   RBC 2.89 (L) 08/22/2017 0656   HGB 8.2 (L) 08/22/2017 0656   HCT 25.8 (L) 08/22/2017 0656   PLT 101 (L) 08/22/2017 0656   MCV 89.3 08/22/2017 0656   MCH 28.4 08/22/2017 0656   MCHC 31.8 08/22/2017 0656   RDW 18.4 (H) 08/22/2017 0656   LYMPHSABS 0.5 (L) 08/17/2017 0902   MONOABS 0.4 08/17/2017 0902   EOSABS 0.0 08/17/2017 0902   BASOSABS 0.0 08/17/2017 0902    BMET    Component Value Date/Time   NA 136 08/23/2017 0410   K 4.0 08/23/2017 0410   CL 98 (L) 08/23/2017 0410   CO2 27 08/23/2017 0410   GLUCOSE 84 08/23/2017 0410   BUN 25 (H) 08/23/2017 0410   CREATININE 6.59 (H) 08/23/2017 0410   CREATININE 4.91 (H) 08/13/2015 1223   CALCIUM 7.7 (L) 08/23/2017 0410   GFRNONAA 9 (L) 08/23/2017 0410   GFRNONAA 13 (L) 07/27/2014 1539   GFRAA 10 (L) 08/23/2017 0410   GFRAA 15 (L) 07/27/2014 1539    INR    Component Value Date/Time   INR 2.34 08/23/2017 0410     Intake/Output Summary (Last 24 hours) at 08/23/2017 1101 Last data filed at 08/22/2017 1107 Gross per 24 hour  Intake -  Output 0 ml  Net 0 ml     Assessment:  57 y.o. male is s/p removal of defibrillator for infection with acute swelling of his left arm AV fistula.  By x-ray he has a fracture stent in this area and ultrasound showed a pseudoaneurysm with possible hematoma in that area although I cannot tell if this is hematoma or infected.  The skin is very thin in that area.  Plan: I discussed with him that I recommend against  discharge home today and that I would make him n.p.o. at midnight given that he has eaten today already.  We will reevaluate tomorrow since this is the first day that I have seen the fistula and he will possibly need operative intervention prior to discharge.  He demonstrates very good understanding.  His INR is 2.3 so we would need to correct this prior to any procedures.   Sailor Hevia C. Donzetta Matters, MD [facility] Office: [phone] Pager: [phone]  08/23/2017 11:01 AM

## 2017-08-23 NOTE — Progress Notes (Signed)
PROGRESS NOTE    Bennye Alm.   YQI:[id number]  DOB: 1961-04-06  DOA: 08/17/2017 PCP: [name], MD   Brief Narrative:  Bennye Alm. is a 57 y/o with ESRD on home hemodialysis on Monday through Friday for the past year, atrial fibrillation on Coumadin, chronic hypertension, nonischemic cardiomyopathy with AICD, pulmonary hypertension, moderate aortic stenosis He has been feeling well up until one day prior to admission when he developed a fever (102.8) and dyspnea.  Apparently his wife stated that he had felt warm for a few days and may have been having fevers and not realizing it.  He has been doing his dialysis and his fistula has felt fine.  He had a dry cough for 1 week. He was admitted at [facility] in transfer to [facility] for ongoing dialysis.  Blood cultures became positive for MSSA. He received a fluid bolus yesterday and albumin infusion this morning (by nephrology) for hypotension. He is also on midodrine at home.  Subjective: No new complaints from those already reported.  Assessment & Plan:   Principal Problem:   MSSA bacteremia with sepsis (fevers leukocytosis hypotension) - In setting of AICD - Appreciate management by ID- Currently on Ancef  - Pt is s/p removal of defibillator  Active Problems:    Atrial fibrillation-Elevated INR - continue patient on Amiodarone - pharmacy managing coumadin  Acute on chronic thrombocytopenia - It appears that platelets have been between 101- 150 in the past - Pt back at baseline  ESRD/acute on chronic hypotension - on dialysis Monday through Friday and midodrine - Being managed by nephrology - Fistula discussion: pt seen by vascular who recommends the following:  By x-ray he has a fracture stent in this area and ultrasound showed a pseudoaneurysm with possible hematoma in that area although I cannot tell if this is hematoma or infected.  The skin is very thin in that area.  Plan: I discussed  with him that I recommend against discharge home today and that I would make him n.p.o. at midnight given that he has eaten today already.  We will reevaluate tomorrow since this is the first day that I have seen the fistula and he will possibly need operative intervention prior to discharge.  He demonstrates very good understanding.  His INR is 2.3 so we would need to correct this prior to any procedures.  I will administer low dose vitamin K so patient can have a lower INR tomorrow and get procedure completed.  Severe pulmonary hypertension Tadalafil is being replaced with Cialis-we will need to hold this due to hypotension -Resume ambrisentan today  Hyperlipidemia -Continue statin  Hypothyroidism Synthroid  DVT prophylaxis: INR elevated-Coumadin Code Status: Full code Family Communication: discussed with patient and family member at bedside.  Disposition Plan: Follow-up on MSSA bacteremia and hypotension- Consultants:   ID  Nephrology Procedures:    Antimicrobials:  Anti-infectives (From admission, onward)   Start     Dose/Rate Route Frequency Ordered Stop   08/20/17 1300  gentamicin (GARAMYCIN) 80 mg in sodium chloride 0.9 % 500 mL irrigation     80 mg Irrigation To ShortStay Surgical 08/19/17 1351 08/20/17 1511   08/20/17 1200  ceFAZolin (ANCEF) 3 g in dextrose 5 % 50 mL IVPB  Status:  Discontinued     3 g 130 mL/hr over 30 Minutes Intravenous To ShortStay Surgical 08/19/17 1351 08/20/17 1954   08/18/17 1800  ceFAZolin (ANCEF) IVPB 1 g/50 mL premix  1 g 100 mL/hr over 30 Minutes Intravenous Every 24 hours 08/18/17 0035     08/18/17 1000  cefTRIAXone (ROCEPHIN) 1 g in sodium chloride 0.9 % 100 mL IVPB  Status:  Discontinued     1 g 200 mL/hr over 30 Minutes Intravenous Every 24 hours 08/17/17 1151 08/18/17 0035   08/18/17 0100  ceFAZolin (ANCEF) IVPB 2g/100 mL premix     2 g 200 mL/hr over 30 Minutes Intravenous  Once 08/18/17 0035 08/18/17 0127   08/17/17 1600   azithromycin (ZITHROMAX) tablet 500 mg  Status:  Discontinued     500 mg Oral Daily 08/17/17 1505 08/18/17 1053   08/17/17 1200  vancomycin (VANCOCIN) 2,000 mg in sodium chloride 0.9 % 500 mL IVPB     2,000 mg 250 mL/hr over 120 Minutes Intravenous  Once 08/17/17 1144 08/17/17 1526   08/17/17 1115  cefTRIAXone (ROCEPHIN) 1 g in sodium chloride 0.9 % 100 mL IVPB     1 g 200 mL/hr over 30 Minutes Intravenous  Once 08/17/17 1111 08/17/17 1313   08/17/17 1115  azithromycin (ZITHROMAX) tablet 500 mg     500 mg Oral  Once 08/17/17 1111 08/17/17 1139       Objective: Vitals:   08/22/17 2145 08/22/17 2320 08/23/17 0452 08/23/17 1151  BP: (!) 76/56 (!) 78/48 (!) 77/54 (!) 86/65  Pulse: 87   (!) 36  Resp: 18 15 (!) 23 13  Temp: (!) 97.5 F (36.4 C) 97.6 F (36.4 C) 97.7 F (36.5 C) (!) 97.4 F (36.3 C)  TempSrc: Oral Oral Oral Oral  SpO2: 100% 94% 93% 94%  Weight:   126.2 kg (278 lb 3.5 oz)   Height:        Intake/Output Summary (Last 24 hours) at 08/23/2017 1247 Last data filed at 08/23/2017 1241 Gross per 24 hour  Intake 240 ml  Output -  Net 240 ml   Filed Weights   08/22/17 0755 08/22/17 1107 08/23/17 0452  Weight: 126.4 kg (278 lb 10.6 oz) 126.4 kg (278 lb 10.6 oz) 126.2 kg (278 lb 3.5 oz)    Examination: General exam: Pt in nad, alert and awake.  HEENT: PERRLA, oral mucosa moist, no sclera icterus or thrush Respiratory system: Clear to auscultation.  Has a dry cough respiratory effort normal. Cardiovascular system: S1 & S2 heard, RRR. No rubs   Gastrointestinal system: Abdomen soft, non-tender, nondistended. Normal bowel sound. No organomegaly Central nervous system: Alert and oriented. No focal neurological deficits. Extremities: No cyanosis, clubbing or edema Skin: No rashes or ulcers Psychiatry:  Mood & affect appropriate.   Data Reviewed: I have personally reviewed following labs and imaging studies  CBC: Recent Labs  Lab 08/17/17 0902  08/19/17 0249  08/20/17 0354 08/20/17 1655 08/20/17 2053 08/21/17 0432 08/22/17 0656  WBC 12.0*   < > 7.9 7.2  --  6.7 4.7 6.2  NEUTROABS 11.1*  --   --   --   --   --   --   --   HGB 10.5*   < > 9.5* 8.9* 11.6* 8.7* 8.1* 8.2*  HCT 33.0*   < > 30.0* 28.0* 34.0* 27.3* 25.2* 25.8*  MCV 90.7   < > 90.1 92.1  --  91.3 91.0 89.3  PLT 78*   < > 60* 78*  --  80* 78* 101*   < > = values in this interval not displayed.   Basic Metabolic Panel: Recent Labs  Lab 08/20/17 0354 08/20/17 1655 08/20/17 2053  08/21/17 0432 08/22/17 0257 08/23/17 0410  NA 133* 134* 133* 133* 134* 136  K 3.8 3.7 3.6 4.0 3.9 4.0  CL 95*  --  96* 96* 96* 98*  CO2 26  --  24 24 24 27   GLUCOSE 90  --  136* 137* 129* 84  BUN 19  --  26* 28* 37* 25*  CREATININE 6.11*  --  7.03* 7.48* 8.57* 6.59*  CALCIUM 7.8*  --  7.5* 7.4* 7.9* 7.7*  PHOS 2.3*  --  2.9 3.7 3.3 2.5   GFR: Estimated Creatinine Clearance: 17.8 mL/min (A) (by C-G formula based on SCr of 6.59 mg/dL (H)). Liver Function Tests: Recent Labs  Lab 08/17/17 0902  08/20/17 0354 08/20/17 2053 08/21/17 0432 08/22/17 0257 08/23/17 0410  AST 24  --   --   --   --   --   --   ALT 13*  --   --   --   --   --   --   ALKPHOS 90  --   --   --   --   --   --   BILITOT 1.1  --   --   --   --   --   --   PROT 6.8  --   --   --   --   --   --   ALBUMIN 3.4*   < > 3.1* 3.0* 2.8* 2.9* 2.7*   < > = values in this interval not displayed.   No results for input(s): LIPASE, AMYLASE in the last 168 hours. No results for input(s): AMMONIA in the last 168 hours. Coagulation Profile: Recent Labs  Lab 08/19/17 1600 08/20/17 0354 08/21/17 0432 08/22/17 0257 08/23/17 0410  INR 3.34 2.14 1.99 2.43 2.34   Cardiac Enzymes: No results for input(s): CKTOTAL, CKMB, CKMBINDEX, TROPONINI in the last 168 hours. BNP (last 3 results) No results for input(s): PROBNP in the last 8760 hours. HbA1C: No results for input(s): HGBA1C in the last 72 hours. CBG: No results for input(s):  GLUCAP in the last 168 hours. Lipid Profile: No results for input(s): CHOL, HDL, LDLCALC, TRIG, CHOLHDL, LDLDIRECT in the last 72 hours. Thyroid Function Tests: No results for input(s): TSH, T4TOTAL, FREET4, T3FREE, THYROIDAB in the last 72 hours. Anemia Panel: No results for input(s): VITAMINB12, FOLATE, FERRITIN, TIBC, IRON, RETICCTPCT in the last 72 hours. Urine analysis: No results found for: COLORURINE, APPEARANCEUR, LABSPEC, PHURINE, GLUCOSEU, HGBUR, BILIRUBINUR, KETONESUR, PROTEINUR, UROBILINOGEN, NITRITE, LEUKOCYTESUR Sepsis Labs: @LABRCNTIP (procalcitonin:4,lacticidven:4) ) Recent Results (from the past 240 hour(s))  Blood culture (routine x 2)     Status: Abnormal   Collection Time: 08/17/17  9:22 AM  Result Value Ref Range Status   Specimen Description   Final    BLOOD RIGHT FOREARM Performed at [facility] [address] [place], [place] [address]    Special Requests   Final    BOTTLES DRAWN AEROBIC AND ANAEROBIC Blood Culture adequate volume Performed at [facility] [address] [place], [place] [address]    Culture  Setup Time   Final    GRAM POSITIVE COCCI IN BOTH AEROBIC AND ANAEROBIC BOTTLES Organism ID to follow CRITICAL RESULT CALLED TO, READ BACK BY AND VERIFIED WITH: A MASTERS PHARMD 08/18/17 0009 JDW Performed at [facility], [place] [address] [place], [place] [address]    Culture STAPHYLOCOCCUS AUREUS (A)  Final   Report Status 08/20/2017 FINAL  Final   Organism ID,  Bacteria STAPHYLOCOCCUS AUREUS  Final      Susceptibility   Staphylococcus aureus - MIC*    CIPROFLOXACIN <=0.5 SENSITIVE Sensitive     ERYTHROMYCIN >=8 RESISTANT Resistant     GENTAMICIN <=0.5 SENSITIVE Sensitive     OXACILLIN 0.5 SENSITIVE Sensitive     TETRACYCLINE <=1 SENSITIVE Sensitive     VANCOMYCIN <=0.5 SENSITIVE Sensitive     TRIMETH/SULFA <=10 SENSITIVE Sensitive     CLINDAMYCIN <=0.25 SENSITIVE Sensitive     RIFAMPIN <=0.5  SENSITIVE Sensitive     Inducible Clindamycin NEGATIVE Sensitive     * STAPHYLOCOCCUS AUREUS  Blood Culture ID Panel (Reflexed)     Status: Abnormal   Collection Time: 08/17/17  9:22 AM  Result Value Ref Range Status   Enterococcus species NOT DETECTED NOT DETECTED Final   Listeria monocytogenes NOT DETECTED NOT DETECTED Final   Staphylococcus species DETECTED (A) NOT DETECTED Final    Comment: CRITICAL RESULT CALLED TO, READ BACK BY AND VERIFIED WITH: A MASTERS PHARMD 08/18/17 0009 JDW    Staphylococcus aureus DETECTED (A) NOT DETECTED Final    Comment: Methicillin (oxacillin) susceptible Staphylococcus aureus (MSSA). Preferred therapy is anti staphylococcal beta lactam antibiotic (Cefazolin or Nafcillin), unless clinically contraindicated. CRITICAL RESULT CALLED TO, READ BACK BY AND VERIFIED WITH: A MASTERS PHARMD 08/18/17 0009 JDW    Methicillin resistance NOT DETECTED NOT DETECTED Final   Streptococcus species NOT DETECTED NOT DETECTED Final   Streptococcus agalactiae NOT DETECTED NOT DETECTED Final   Streptococcus pneumoniae NOT DETECTED NOT DETECTED Final   Streptococcus pyogenes NOT DETECTED NOT DETECTED Final   Acinetobacter baumannii NOT DETECTED NOT DETECTED Final   Enterobacteriaceae species NOT DETECTED NOT DETECTED Final   Enterobacter cloacae complex NOT DETECTED NOT DETECTED Final   Escherichia coli NOT DETECTED NOT DETECTED Final   Klebsiella oxytoca NOT DETECTED NOT DETECTED Final   Klebsiella pneumoniae NOT DETECTED NOT DETECTED Final   Proteus species NOT DETECTED NOT DETECTED Final   Serratia marcescens NOT DETECTED NOT DETECTED Final   Haemophilus influenzae NOT DETECTED NOT DETECTED Final   Neisseria meningitidis NOT DETECTED NOT DETECTED Final   Pseudomonas aeruginosa NOT DETECTED NOT DETECTED Final   Candida albicans NOT DETECTED NOT DETECTED Final   Candida glabrata NOT DETECTED NOT DETECTED Final   Candida krusei NOT DETECTED NOT DETECTED Final   Candida  parapsilosis NOT DETECTED NOT DETECTED Final   Candida tropicalis NOT DETECTED NOT DETECTED Final    Comment: Performed at [facility], [place]. [address], [place], [place] [address]  Blood culture (routine x 2)     Status: Abnormal   Collection Time: 08/17/17  9:52 AM  Result Value Ref Range Status   Specimen Description   Final    BLOOD RIGHT FOREARM Performed at [facility] [address] [place], [place] [address]    Special Requests   Final    BOTTLES DRAWN AEROBIC AND ANAEROBIC Blood Culture adequate volume Performed at [facility] [address] [place], [place] [address]    Culture  Setup Time   Final    GRAM POSITIVE COCCI IN BOTH AEROBIC AND ANAEROBIC BOTTLES CRITICAL RESULT CALLED TO, READ BACK BY AND VERIFIED WITH: A MASTERS PHARMD 08/18/17 00009 JDW    Culture (A)  Final    STAPHYLOCOCCUS AUREUS SUSCEPTIBILITIES PERFORMED ON PREVIOUS CULTURE WITHIN THE LAST 5 DAYS. Performed at [facility], [place] [address] [place], [place] [address]    Report Status 08/20/2017  FINAL  Final  MRSA PCR Screening     Status: None   Collection Time: 08/17/17  8:40 PM  Result Value Ref Range Status   MRSA by PCR NEGATIVE NEGATIVE Final    Comment:        The GeneXpert MRSA Assay (FDA approved for NASAL specimens only), is one component of a comprehensive MRSA colonization surveillance program. It is not intended to diagnose MRSA infection nor to guide or monitor treatment for MRSA infections. Performed at [facility], [place] [address] [place], [place] [address]   Culture, blood (Routine X 2) w Reflex to ID Panel     Status: None   Collection Time: 08/18/17  3:05 AM  Result Value Ref Range Status   Specimen Description BLOOD RIGHT HAND  Final   Special Requests   Final    BOTTLES DRAWN AEROBIC ONLY Blood Culture adequate volume   Culture   Final    NO GROWTH 5 DAYS Performed at [facility], [place] [address]  [place], [place] [address]    Report Status 08/23/2017 FINAL  Final  Culture, blood (Routine X 2) w Reflex to ID Panel     Status: None   Collection Time: 08/18/17  3:05 AM  Result Value Ref Range Status   Specimen Description BLOOD RIGHT HAND  Final   Special Requests   Final    BOTTLES DRAWN AEROBIC ONLY Blood Culture adequate volume   Culture   Final    NO GROWTH 5 DAYS Performed at [facility], [place] [address], [place], [place] [address]    Report Status 08/23/2017 FINAL  Final  Culture, sputum-assessment     Status: None   Collection Time: 08/18/17  9:32 PM  Result Value Ref Range Status   Specimen Description EXPECTORATED SPUTUM  Final   Special Requests NONE  Final   Sputum evaluation   Final    RESULT CALLED TO, READ BACK BY AND VERIFIED WITH: Miguel Dibble RN 1950 08/18/17 A BROWNING Performed at [facility], [place] [address] [place], [place] [address]    Report Status 08/18/2017 FINAL  Final  Surgical PCR screen     Status: Abnormal   Collection Time: 08/20/17 10:11 AM  Result Value Ref Range Status   MRSA, PCR NEGATIVE NEGATIVE Final   Staphylococcus aureus POSITIVE (A) NEGATIVE Final    Comment: (NOTE) The Xpert SA Assay (FDA approved for NASAL specimens in patients 43 years of age and older), is one component of a comprehensive surveillance program. It is not intended to diagnose infection nor to guide or monitor treatment. Performed at [facility], [place] [address], [place], [place] [address]          Radiology Studies: No results found.    Scheduled Meds: . ambrisentan  10 mg Oral Daily  . amiodarone  200 mg Oral QPM  . atorvastatin  40 mg Oral QPM  . calcitRIOL  0.25 mcg Oral Daily  . calcium acetate  1,334 mg Oral TID WC  . Chlorhexidine Gluconate Cloth  6 each Topical Daily  . cinacalcet  90 mg Oral QPM  . darbepoetin (ARANESP) injection - DIALYSIS  150 mcg Intravenous Q Sat-HD  . levothyroxine  300 mcg Oral QAC breakfast  . midodrine   10 mg Oral 3 times per day  . Warfarin - Pharmacist Dosing Inpatient   Does not apply q1800   Continuous Infusions: . sodium chloride    .  sodium chloride 10 mL/hr at 08/21/17 1802  . sodium chloride    .  ceFAZolin (ANCEF) IV 1 g (08/22/17 1827)     LOS: 6 days    Time spent in minutes: [place], MD [facility] Pager:[phone] [email or web address] Password [facility] 08/23/2017, 12:47 PM

## 2017-08-24 ENCOUNTER — Inpatient Hospital Stay (HOSPITAL_COMMUNITY): Payer: Medicare HMO | Admitting: Certified Registered Nurse Anesthetist

## 2017-08-24 ENCOUNTER — Inpatient Hospital Stay (HOSPITAL_COMMUNITY): Payer: Medicare HMO

## 2017-08-24 ENCOUNTER — Encounter (HOSPITAL_COMMUNITY)
Admission: EM | Disposition: A | Discharge status: Home / Self Care | Payer: Self-pay | Source: Home / Self Care | Attending: Family Medicine

## 2017-08-24 ENCOUNTER — Encounter (HOSPITAL_COMMUNITY): Payer: Self-pay | Admitting: Radiology

## 2017-08-24 DIAGNOSIS — I959 Hypotension, unspecified: Secondary | ICD-10-CM

## 2017-08-24 DIAGNOSIS — J9601 Acute respiratory failure with hypoxia: Secondary | ICD-10-CM

## 2017-08-24 HISTORY — PX: INSERTION OF DIALYSIS CATHETER: SHX1324

## 2017-08-24 HISTORY — PX: VEIN HARVEST: SHX6363

## 2017-08-24 HISTORY — PX: REVISON OF ARTERIOVENOUS FISTULA: SHX6074

## 2017-08-24 HISTORY — PX: ARTERIAL LINE INSERTION: CATH118227

## 2017-08-24 LAB — GLUCOSE, CAPILLARY
GLUCOSE-CAPILLARY: 87 mg/dL (ref 65–99)
GLUCOSE-CAPILLARY: 94 mg/dL (ref 65–99)

## 2017-08-24 LAB — CBC
HEMATOCRIT: 27.3 % — AB (ref 39.0–52.0)
HEMOGLOBIN: 8.7 g/dL — AB (ref 13.0–17.0)
MCH: 28.4 pg (ref 26.0–34.0)
MCHC: 31.9 g/dL (ref 30.0–36.0)
MCV: 89.2 fL (ref 78.0–100.0)
Platelets: 136 10*3/uL — ABNORMAL LOW (ref 150–400)
RBC: 3.06 MIL/uL — ABNORMAL LOW (ref 4.22–5.81)
RDW: 18.9 % — ABNORMAL HIGH (ref 11.5–15.5)
WBC: 7.4 10*3/uL (ref 4.0–10.5)

## 2017-08-24 LAB — RENAL FUNCTION PANEL
ALBUMIN: 2.9 g/dL — AB (ref 3.5–5.0)
ANION GAP: 15 (ref 5–15)
ANION GAP: 13 (ref 5–15)
Albumin: 3 g/dL — ABNORMAL LOW (ref 3.5–5.0)
BUN: 31 mg/dL — ABNORMAL HIGH (ref 6–20)
BUN: 38 mg/dL — ABNORMAL HIGH (ref 6–20)
CALCIUM: 7.8 mg/dL — AB (ref 8.9–10.3)
CALCIUM: 7.4 mg/dL — AB (ref 8.9–10.3)
CHLORIDE: 99 mmol/L — AB (ref 101–111)
CO2: 22 mmol/L (ref 22–32)
CO2: 22 mmol/L (ref 22–32)
CREATININE: 7.94 mg/dL — AB (ref 0.61–1.24)
Chloride: 97 mmol/L — ABNORMAL LOW (ref 101–111)
Creatinine, Ser: 8.62 mg/dL — ABNORMAL HIGH (ref 0.61–1.24)
GFR calc Af Amer: 7 mL/min — ABNORMAL LOW (ref >60)
GFR calc non Af Amer: 6 mL/min — ABNORMAL LOW (ref >60)
GFR, EST AFRICAN AMERICAN: 8 mL/min — AB (ref >60)
GFR, EST NON AFRICAN AMERICAN: 7 mL/min — AB (ref >60)
GLUCOSE: 91 mg/dL (ref 65–99)
Glucose, Bld: 76 mg/dL (ref 65–99)
PHOSPHORUS: 2.8 mg/dL (ref 2.5–4.6)
POTASSIUM: 4.7 mmol/L (ref 3.5–5.1)
Phosphorus: 3.2 mg/dL (ref 2.5–4.6)
Potassium: 4 mmol/L (ref 3.5–5.1)
SODIUM: 134 mmol/L — AB (ref 135–145)
SODIUM: 134 mmol/L — AB (ref 135–145)

## 2017-08-24 LAB — TYPE AND SCREEN
ABO/RH(D): O POS
Antibody Screen: NEGATIVE

## 2017-08-24 LAB — POCT I-STAT 3, ART BLOOD GAS (G3+)
ACID-BASE DEFICIT: 2 mmol/L (ref 0.0–2.0)
Bicarbonate: 21.8 mmol/L (ref 20.0–28.0)
O2 Saturation: 93 %
PCO2 ART: 33.5 mmHg (ref 32.0–48.0)
PH ART: 7.421 (ref 7.350–7.450)
PO2 ART: 63 mmHg — AB (ref 83.0–108.0)
Patient temperature: 98
TCO2: 23 mmol/L (ref 22–32)

## 2017-08-24 LAB — PROTIME-INR
INR: 1.88
Prothrombin Time: 21.4 seconds — ABNORMAL HIGH (ref 11.4–15.2)

## 2017-08-24 SURGERY — REVISON OF ARTERIOVENOUS FISTULA
Anesthesia: General | Site: Groin | Laterality: Right

## 2017-08-24 SURGERY — REVISON OF ARTERIOVENOUS FISTULA
Anesthesia: General | Laterality: Left

## 2017-08-24 MED ORDER — DEXTROSE 5 % IV SOLN
3000.0000 mg | INTRAVENOUS | Status: AC
  Administered 2017-08-24: 3 g via INTRAVENOUS
  Filled 2017-08-24: qty 30

## 2017-08-24 MED ORDER — SODIUM CHLORIDE 0.9 % IV SOLN
0.0000 ug/min | INTRAVENOUS | Status: DC
  Filled 2017-08-24: qty 1

## 2017-08-24 MED ORDER — MIDAZOLAM HCL 2 MG/2ML IJ SOLN
2.0000 mg | INTRAMUSCULAR | Status: DC | PRN
  Filled 2017-08-24: qty 2

## 2017-08-24 MED ORDER — DEXMEDETOMIDINE HCL IN NACL 200 MCG/50ML IV SOLN
0.0000 ug/kg/h | INTRAVENOUS | Status: DC
  Administered 2017-08-24 – 2017-08-25 (×4): 0.4 ug/kg/h via INTRAVENOUS
  Filled 2017-08-24 (×4): qty 50

## 2017-08-24 MED ORDER — PROTAMINE SULFATE 10 MG/ML IV SOLN
INTRAVENOUS | Status: DC | PRN
  Administered 2017-08-24: 40 mg via INTRAVENOUS

## 2017-08-24 MED ORDER — HEMOSTATIC AGENTS (NO CHARGE) OPTIME
TOPICAL | Status: DC | PRN
  Administered 2017-08-24: 1 via TOPICAL

## 2017-08-24 MED ORDER — PHENYLEPHRINE 40 MCG/ML (10ML) SYRINGE FOR IV PUSH (FOR BLOOD PRESSURE SUPPORT)
PREFILLED_SYRINGE | INTRAVENOUS | Status: DC | PRN
  Administered 2017-08-24: 80 ug via INTRAVENOUS
  Administered 2017-08-24: 120 ug via INTRAVENOUS
  Administered 2017-08-24 (×2): 80 ug via INTRAVENOUS
  Administered 2017-08-24: 120 ug via INTRAVENOUS
  Administered 2017-08-24: 80 ug via INTRAVENOUS
  Administered 2017-08-24: 120 ug via INTRAVENOUS

## 2017-08-24 MED ORDER — MIDAZOLAM HCL 2 MG/2ML IJ SOLN
2.0000 mg | INTRAMUSCULAR | Status: DC | PRN
  Administered 2017-08-24 (×2): 2 mg via INTRAVENOUS
  Filled 2017-08-24: qty 2

## 2017-08-24 MED ORDER — IOPAMIDOL (ISOVUE-370) INJECTION 76%
INTRAVENOUS | Status: AC
  Filled 2017-08-24: qty 100

## 2017-08-24 MED ORDER — EPINEPHRINE PF 1 MG/10ML IJ SOSY
PREFILLED_SYRINGE | INTRAMUSCULAR | Status: AC
  Filled 2017-08-24: qty 10

## 2017-08-24 MED ORDER — DIPHENHYDRAMINE HCL 50 MG/ML IJ SOLN
50.0000 mg | Freq: Once | INTRAMUSCULAR | Status: AC
  Administered 2017-08-24: 50 mg via INTRAVENOUS
  Filled 2017-08-24: qty 1

## 2017-08-24 MED ORDER — PROPOFOL 10 MG/ML IV BOLUS
INTRAVENOUS | Status: DC | PRN
  Administered 2017-08-24 (×2): 20 mg via INTRAVENOUS

## 2017-08-24 MED ORDER — HEPARIN SODIUM (PORCINE) 1000 UNIT/ML IJ SOLN
INTRAMUSCULAR | Status: DC | PRN
  Administered 2017-08-24: 8000 [IU] via INTRAVENOUS

## 2017-08-24 MED ORDER — SODIUM CHLORIDE 0.9 % IV SOLN
INTRAVENOUS | Status: AC
  Filled 2017-08-24: qty 1.2

## 2017-08-24 MED ORDER — ALTEPLASE 2 MG IJ SOLR: 2.0000 mg | Freq: Once | INTRAMUSCULAR | Status: DC | PRN

## 2017-08-24 MED ORDER — HEPARIN SODIUM (PORCINE) 1000 UNIT/ML IJ SOLN
INTRAMUSCULAR | Status: AC
  Filled 2017-08-24: qty 1

## 2017-08-24 MED ORDER — FENTANYL CITRATE (PF) 250 MCG/5ML IJ SOLN
INTRAMUSCULAR | Status: DC | PRN
  Administered 2017-08-24: 25 ug via INTRAVENOUS

## 2017-08-24 MED ORDER — PREDNISONE 5 MG PO TABS
5.0000 mg | ORAL_TABLET | Freq: Once | ORAL | Status: AC
  Administered 2017-08-24: 5 mg via ORAL
  Filled 2017-08-24: qty 1

## 2017-08-24 MED ORDER — HEPARIN SODIUM (PORCINE) 1000 UNIT/ML IJ SOLN
INTRAMUSCULAR | Status: DC | PRN
  Administered 2017-08-24: 1000 [IU] via INTRAVENOUS

## 2017-08-24 MED ORDER — LIDOCAINE-EPINEPHRINE (PF) 1 %-1:200000 IJ SOLN
INTRAMUSCULAR | Status: AC
  Filled 2017-08-24: qty 30

## 2017-08-24 MED ORDER — EPINEPHRINE PF 1 MG/ML IJ SOLN
INTRAVENOUS | Status: DC | PRN
  Administered 2017-08-24: 4 ug/min via INTRAVENOUS

## 2017-08-24 MED ORDER — MIDAZOLAM HCL 2 MG/2ML IJ SOLN
INTRAMUSCULAR | Status: AC
  Filled 2017-08-24: qty 2

## 2017-08-24 MED ORDER — DEXMEDETOMIDINE HCL IN NACL 200 MCG/50ML IV SOLN
INTRAVENOUS | Status: AC
  Filled 2017-08-24: qty 50

## 2017-08-24 MED ORDER — FENTANYL CITRATE (PF) 250 MCG/5ML IJ SOLN
INTRAMUSCULAR | Status: AC
  Filled 2017-08-24: qty 5

## 2017-08-24 MED ORDER — SODIUM CHLORIDE 0.9 % IV SOLN
INTRAVENOUS | Status: DC | PRN
  Administered 2017-08-24: 12:00:00

## 2017-08-24 MED ORDER — PHENYLEPHRINE HCL 10 MG/ML IJ SOLN
INTRAMUSCULAR | Status: AC
  Filled 2017-08-24: qty 1

## 2017-08-24 MED ORDER — FENTANYL CITRATE (PF) 100 MCG/2ML IJ SOLN
100.0000 ug | INTRAMUSCULAR | Status: DC | PRN
  Administered 2017-08-24 (×2): 100 ug via INTRAVENOUS
  Filled 2017-08-24 (×3): qty 2

## 2017-08-24 MED ORDER — IOPAMIDOL (ISOVUE-370) INJECTION 76%: 100.0000 mL | Freq: Once | INTRAVENOUS | Status: DC | PRN

## 2017-08-24 MED ORDER — LIDOCAINE 2% (20 MG/ML) 5 ML SYRINGE
INTRAMUSCULAR | Status: DC | PRN
  Administered 2017-08-24: 60 mg via INTRAVENOUS

## 2017-08-24 MED ORDER — EPINEPHRINE PF 1 MG/ML IJ SOLN
0.5000 ug/min | INTRAMUSCULAR | Status: DC
  Filled 2017-08-24: qty 4

## 2017-08-24 MED ORDER — ORAL CARE MOUTH RINSE: 15.0000 mL | Freq: Four times a day (QID) | OROMUCOSAL | Status: DC

## 2017-08-24 MED ORDER — ROCURONIUM BROMIDE 10 MG/ML (PF) SYRINGE
PREFILLED_SYRINGE | INTRAVENOUS | Status: AC
  Filled 2017-08-24: qty 20

## 2017-08-24 MED ORDER — ALBUMIN HUMAN 5 % IV SOLN
INTRAVENOUS | Status: DC | PRN
  Administered 2017-08-24: 13:00:00 via INTRAVENOUS

## 2017-08-24 MED ORDER — ROCURONIUM BROMIDE 10 MG/ML (PF) SYRINGE
PREFILLED_SYRINGE | INTRAVENOUS | Status: DC | PRN
  Administered 2017-08-24 (×2): 20 mg via INTRAVENOUS
  Administered 2017-08-24 (×3): 40 mg via INTRAVENOUS

## 2017-08-24 MED ORDER — FAMOTIDINE IN NACL 20-0.9 MG/50ML-% IV SOLN
20.0000 mg | INTRAVENOUS | Status: DC
  Administered 2017-08-24: 20 mg via INTRAVENOUS
  Filled 2017-08-24: qty 50

## 2017-08-24 MED ORDER — PENTAFLUOROPROP-TETRAFLUOROETH EX AERO: 1.0000 "application " | INHALATION_SPRAY | CUTANEOUS | Status: DC | PRN

## 2017-08-24 MED ORDER — LIDOCAINE-PRILOCAINE 2.5-2.5 % EX CREA
1.0000 "application " | TOPICAL_CREAM | CUTANEOUS | Status: DC | PRN
  Filled 2017-08-24: qty 5

## 2017-08-24 MED ORDER — PHENYLEPHRINE HCL 10 MG/ML IJ SOLN
INTRAVENOUS | Status: DC | PRN
  Administered 2017-08-24: 25 ug/min via INTRAVENOUS

## 2017-08-24 MED ORDER — SODIUM CHLORIDE 0.9 % IV SOLN: 100.0000 mL | INTRAVENOUS | Status: DC | PRN

## 2017-08-24 MED ORDER — PROTAMINE SULFATE 10 MG/ML IV SOLN
INTRAVENOUS | Status: AC
Start: 2017-08-24 — End: ?
  Filled 2017-08-24: qty 25

## 2017-08-24 MED ORDER — VASOPRESSIN 20 UNIT/ML IV SOLN
INTRAVENOUS | Status: DC | PRN
  Administered 2017-08-24: 1 [IU] via INTRAVENOUS
  Administered 2017-08-24: 2 [IU] via INTRAVENOUS
  Administered 2017-08-24: 1 [IU] via INTRAVENOUS
  Administered 2017-08-24: 2 [IU] via INTRAVENOUS
  Administered 2017-08-24 (×3): 1 [IU] via INTRAVENOUS

## 2017-08-24 MED ORDER — DEXMEDETOMIDINE HCL IN NACL 200 MCG/50ML IV SOLN
INTRAVENOUS | Status: DC | PRN
  Administered 2017-08-24: .3 ug/kg/h via INTRAVENOUS

## 2017-08-24 MED ORDER — LIDOCAINE HCL (PF) 1 % IJ SOLN
INTRAMUSCULAR | Status: AC
  Filled 2017-08-24: qty 30

## 2017-08-24 MED ORDER — SUCCINYLCHOLINE CHLORIDE 200 MG/10ML IV SOSY
PREFILLED_SYRINGE | INTRAVENOUS | Status: DC | PRN
  Administered 2017-08-24: 120 mg via INTRAVENOUS

## 2017-08-24 MED ORDER — SODIUM CHLORIDE 0.9 % IV SOLN
INTRAVENOUS | Status: DC | PRN
  Administered 2017-08-24: 12:00:00 via INTRAVENOUS

## 2017-08-24 MED ORDER — VASOPRESSIN 20 UNIT/ML IV SOLN
INTRAVENOUS | Status: AC
  Filled 2017-08-24: qty 1

## 2017-08-24 MED ORDER — MUPIROCIN 2 % EX OINT
1.0000 "application " | TOPICAL_OINTMENT | Freq: Two times a day (BID) | CUTANEOUS | Status: DC
  Administered 2017-08-24 – 2017-08-28 (×6): 1 via NASAL
  Filled 2017-08-24 (×3): qty 22

## 2017-08-24 MED ORDER — LIDOCAINE HCL (PF) 1 % IJ SOLN: 5.0000 mL | INTRAMUSCULAR | Status: DC | PRN

## 2017-08-24 MED ORDER — MIDAZOLAM HCL 2 MG/2ML IJ SOLN
INTRAMUSCULAR | Status: DC | PRN
  Administered 2017-08-24: 2 mg via INTRAVENOUS
  Administered 2017-08-24 (×2): 1 mg via INTRAVENOUS

## 2017-08-24 MED ORDER — CHLORHEXIDINE GLUCONATE 0.12% ORAL RINSE (MEDLINE KIT)
15.0000 mL | Freq: Two times a day (BID) | OROMUCOSAL | Status: DC
  Administered 2017-08-24: 15 mL via OROMUCOSAL

## 2017-08-24 MED ORDER — CHLORHEXIDINE GLUCONATE 0.12% ORAL RINSE (MEDLINE KIT)
15.0000 mL | Freq: Two times a day (BID) | OROMUCOSAL | Status: DC
  Administered 2017-08-25 (×2): 15 mL via OROMUCOSAL

## 2017-08-24 MED ORDER — 0.9 % SODIUM CHLORIDE (POUR BTL) OPTIME
TOPICAL | Status: DC | PRN
  Administered 2017-08-24: 1000 mL

## 2017-08-24 MED ORDER — LIDOCAINE 2% (20 MG/ML) 5 ML SYRINGE
INTRAMUSCULAR | Status: AC
  Filled 2017-08-24: qty 15

## 2017-08-24 MED ORDER — ETOMIDATE 2 MG/ML IV SOLN
INTRAVENOUS | Status: AC
  Filled 2017-08-24: qty 20

## 2017-08-24 MED ORDER — ORAL CARE MOUTH RINSE
15.0000 mL | OROMUCOSAL | Status: DC
  Administered 2017-08-24 – 2017-08-25 (×5): 15 mL via OROMUCOSAL

## 2017-08-24 MED ORDER — EPINEPHRINE PF 1 MG/10ML IJ SOSY
PREFILLED_SYRINGE | INTRAMUSCULAR | Status: DC | PRN
  Administered 2017-08-24 (×3): 10 ug via INTRAVENOUS
  Administered 2017-08-24: 20 ug via INTRAVENOUS

## 2017-08-24 MED ORDER — HEPARIN SODIUM (PORCINE) 1000 UNIT/ML DIALYSIS
1000.0000 [IU] | INTRAMUSCULAR | Status: DC | PRN
  Administered 2017-08-25: 6000 [IU] via INTRAVENOUS_CENTRAL
  Filled 2017-08-24 (×6): qty 1

## 2017-08-24 MED ORDER — IOPAMIDOL (ISOVUE-300) INJECTION 61%: 100.0000 mL | Freq: Once | INTRAVENOUS | Status: DC | PRN

## 2017-08-24 MED ORDER — ETOMIDATE 2 MG/ML IV SOLN
INTRAVENOUS | Status: DC | PRN
  Administered 2017-08-24 (×2): 10 mg via INTRAVENOUS

## 2017-08-24 MED ORDER — PHENYLEPHRINE 40 MCG/ML (10ML) SYRINGE FOR IV PUSH (FOR BLOOD PRESSURE SUPPORT)
PREFILLED_SYRINGE | INTRAVENOUS | Status: AC
  Filled 2017-08-24: qty 30

## 2017-08-24 MED ORDER — CEFAZOLIN SODIUM-DEXTROSE 2-4 GM/100ML-% IV SOLN
INTRAVENOUS | Status: AC
  Filled 2017-08-24: qty 100

## 2017-08-24 MED ORDER — IOPAMIDOL (ISOVUE-370) INJECTION 76%
INTRAVENOUS | Status: AC
Start: 2017-08-24 — End: 2017-08-24
  Administered 2017-08-24: 100 mL
  Filled 2017-08-24: qty 100

## 2017-08-24 MED ORDER — EPHEDRINE SULFATE-NACL 50-0.9 MG/10ML-% IV SOSY
PREFILLED_SYRINGE | INTRAVENOUS | Status: DC | PRN
  Administered 2017-08-24 (×5): 10 mg via INTRAVENOUS

## 2017-08-24 MED ORDER — FENTANYL CITRATE (PF) 100 MCG/2ML IJ SOLN
100.0000 ug | INTRAMUSCULAR | Status: DC | PRN
Start: 2017-08-24 — End: 2017-08-25
  Administered 2017-08-24 (×2): 100 ug via INTRAVENOUS
  Filled 2017-08-24: qty 2

## 2017-08-24 MED ORDER — SODIUM CHLORIDE 0.9 % IV SOLN: Freq: Once | INTRAVENOUS | Status: AC

## 2017-08-24 SURGICAL SUPPLY — 72 items
ARMBAND PINK RESTRICT EXTREMIT (MISCELLANEOUS) ×4 IMPLANT
BAG DECANTER FOR FLEXI CONT (MISCELLANEOUS) ×4 IMPLANT
BIOPATCH RED 1 DISK 7.0 (GAUZE/BANDAGES/DRESSINGS) ×12 IMPLANT
BNDG ESMARK 4X9 LF (GAUZE/BANDAGES/DRESSINGS) ×4 IMPLANT
BNDG GAUZE ELAST 4 BULKY (GAUZE/BANDAGES/DRESSINGS) ×4 IMPLANT
CANISTER SUCT 3000ML PPV (MISCELLANEOUS) ×4 IMPLANT
CATH PALINDROME RT-P 15FX19CM (CATHETERS) IMPLANT
CATH PALINDROME RT-P 15FX23CM (CATHETERS) IMPLANT
CATH PALINDROME RT-P 15FX28CM (CATHETERS) IMPLANT
CATH PALINDROME RT-P 15FX55CM (CATHETERS) ×4 IMPLANT
CATH STRAIGHT 5FR 65CM (CATHETERS) ×4 IMPLANT
CLIP VESOCCLUDE MED 6/CT (CLIP) ×4 IMPLANT
CLIP VESOCCLUDE SM WIDE 6/CT (CLIP) ×4 IMPLANT
COVER DOME SNAP 22 D (MISCELLANEOUS) ×4 IMPLANT
COVER PROBE W GEL 5X96 (DRAPES) ×12 IMPLANT
COVER SURGICAL LIGHT HANDLE (MISCELLANEOUS) ×4 IMPLANT
DECANTER SPIKE VIAL GLASS SM (MISCELLANEOUS) ×4 IMPLANT
DERMABOND ADVANCED (GAUZE/BANDAGES/DRESSINGS) ×1
DERMABOND ADVANCED .7 DNX12 (GAUZE/BANDAGES/DRESSINGS) ×3 IMPLANT
DRAPE C-ARM 42X72 X-RAY (DRAPES) IMPLANT
DRAPE CHEST BREAST 15X10 FENES (DRAPES) ×4 IMPLANT
ELECT REM PT RETURN 9FT ADLT (ELECTROSURGICAL) ×4
ELECTRODE REM PT RTRN 9FT ADLT (ELECTROSURGICAL) ×3 IMPLANT
GAUZE SPONGE 4X4 12PLY STRL (GAUZE/BANDAGES/DRESSINGS) ×4 IMPLANT
GAUZE SPONGE 4X4 16PLY XRAY LF (GAUZE/BANDAGES/DRESSINGS) ×4 IMPLANT
GLOVE BIO SURGEON STRL SZ7 (GLOVE) ×8 IMPLANT
GLOVE BIOGEL PI IND STRL 7.0 (GLOVE) ×3 IMPLANT
GLOVE BIOGEL PI IND STRL 7.5 (GLOVE) ×9 IMPLANT
GLOVE BIOGEL PI INDICATOR 7.0 (GLOVE) ×1
GLOVE BIOGEL PI INDICATOR 7.5 (GLOVE) ×3
GOWN STRL REUS W/ TWL LRG LVL3 (GOWN DISPOSABLE) ×6 IMPLANT
GOWN STRL REUS W/ TWL XL LVL3 (GOWN DISPOSABLE) ×6 IMPLANT
GOWN STRL REUS W/TWL LRG LVL3 (GOWN DISPOSABLE) ×2
GOWN STRL REUS W/TWL XL LVL3 (GOWN DISPOSABLE) ×2
HEMOSTAT SPONGE AVITENE ULTRA (HEMOSTASIS) ×4 IMPLANT
KIT BASIN OR (CUSTOM PROCEDURE TRAY) ×4 IMPLANT
KIT TURNOVER KIT B (KITS) ×4 IMPLANT
NEEDLE 18GX1X1/2 (RX/OR ONLY) (NEEDLE) ×4 IMPLANT
NEEDLE HYPO 25GX1X1/2 BEV (NEEDLE) ×4 IMPLANT
NS IRRIG 1000ML POUR BTL (IV SOLUTION) ×4 IMPLANT
PACK CV ACCESS (CUSTOM PROCEDURE TRAY) ×4 IMPLANT
PACK SURGICAL SETUP 50X90 (CUSTOM PROCEDURE TRAY) ×4 IMPLANT
PAD ARMBOARD 7.5X6 YLW CONV (MISCELLANEOUS) ×8 IMPLANT
SET MICROPUNCTURE 5F STIFF (MISCELLANEOUS) ×4 IMPLANT
SHEATH AVANTI 11CM 5FR (SHEATH) ×4 IMPLANT
SOAP 2 % CHG 4 OZ (WOUND CARE) ×4 IMPLANT
SPONGE LAP 18X18 X RAY DECT (DISPOSABLE) ×4 IMPLANT
STAPLER VISISTAT 35W (STAPLE) ×4 IMPLANT
STOPCOCK 4 WAY LG BORE MALE ST (IV SETS) ×8 IMPLANT
SUT ETHILON 3 0 PS 1 (SUTURE) ×8 IMPLANT
SUT MNCRL AB 4-0 PS2 18 (SUTURE) ×8 IMPLANT
SUT PROLENE 3 0 SH DA (SUTURE) ×4 IMPLANT
SUT PROLENE 5 0 C 1 24 (SUTURE) ×8 IMPLANT
SUT PROLENE 6 0 BV (SUTURE) IMPLANT
SUT PROLENE 7 0 BV 1 (SUTURE) ×4 IMPLANT
SUT VIC AB 2-0 CT1 27 (SUTURE) ×2
SUT VIC AB 2-0 CT1 TAPERPNT 27 (SUTURE) ×6 IMPLANT
SUT VIC AB 3-0 SH 27 (SUTURE) ×2
SUT VIC AB 3-0 SH 27X BRD (SUTURE) ×6 IMPLANT
SWAB CULTURE LIQUID MINI MALE (MISCELLANEOUS) ×4 IMPLANT
SYR 10ML LL (SYRINGE) ×4 IMPLANT
SYR 20CC LL (SYRINGE) ×12 IMPLANT
SYR 3ML LL SCALE MARK (SYRINGE) ×4 IMPLANT
SYR 5ML LL (SYRINGE) ×4 IMPLANT
SYR CONTROL 10ML LL (SYRINGE) ×4 IMPLANT
TOWEL GREEN STERILE (TOWEL DISPOSABLE) ×4 IMPLANT
TOWEL GREEN STERILE FF (TOWEL DISPOSABLE) ×4 IMPLANT
TRAY CATH LUMEN 1 20CM STRL (SET/KITS/TRAYS/PACK) ×4 IMPLANT
UNDERPAD 30X30 (UNDERPADS AND DIAPERS) ×4 IMPLANT
WATER STERILE IRR 1000ML POUR (IV SOLUTION) ×4 IMPLANT
WIRE AMPLATZ SS-J .035X180CM (WIRE) ×4 IMPLANT
WIRE BENTSON .035X145CM (WIRE) ×4 IMPLANT

## 2017-08-24 NOTE — Interval H&P Note (Signed)
   History and Physical Update  The patient was interviewed and re-examined.  The patient's previous History and Physical has been reviewed and is unchanged from Dr. Stephens Shire consult except for: interval CTA L arm.  Based on my review of this patient's CTA, I suspect the anterior wall of the distal stent has lost integrity, resulting in what likely is a pseudoaneurysm.  I doubt this arteriovenous fistula can be salvaged.  Patient will probably need placement of tunneled dialysis catheter catheter and ligation of left arteriovenous graft.  Though will consider salvaging the fistula if found to be possible intraoperatively.   Risk, benefits, and alternatives to access surgery were discussed.    The patient is aware the risks include but are not limited to: bleeding, infection, steal syndrome, nerve damage, ischemic monomelic neuropathy, thrombosis, failure to mature, complications related to venous hypertension, need for additional procedures, death and stroke.    The patient agrees to proceed forward with the procedure.   The patient is aware the risks of tunneled dialysis catheter placement include but are not limited to: bleeding, infection, central venous injury, pneumothorax, possible venous stenosis, possible malpositioning in the venous system, and possible infections related to long-term catheter presence.   The patient was aware of these risks and agreed to proceed.   Adele Barthel, MD, FACS [facility] Office: [phone] Pager: [phone]  08/24/2017, 11:27 AM

## 2017-08-24 NOTE — Anesthesia Preprocedure Evaluation (Addendum)
Anesthesia Evaluation  Patient identified by MRN, date of birth, ID band Patient awake    Reviewed: Allergy & Precautions, NPO status , Patient's Chart, lab work & pertinent test results  History of Anesthesia Complications (+) PONV and history of anesthetic complications  Airway Mallampati: I  TM Distance: >3 FB Neck ROM: Full    Dental  (+) Edentulous Upper, Dental Advisory Given   Pulmonary pneumonia,    breath sounds clear to auscultation       Cardiovascular hypertension, + Peripheral Vascular Disease  + dysrhythmias Atrial Fibrillation + Cardiac Defibrillator + Valvular Problems/Murmurs AS  Rhythm:Regular Rate:Normal  EF Normal. Moderate AS. Moderate TR   Neuro/Psych negative neurological ROS     GI/Hepatic negative GI ROS, Neg liver ROS,   Endo/Other  Hypothyroidism   Renal/GU ESRF and DialysisRenal disease     Musculoskeletal  (+) Arthritis ,   Abdominal (+) + obese,   Peds  Hematology  (+) anemia ,   Anesthesia Other Findings   Reproductive/Obstetrics                             Lab Results  Component Value Date   WBC 6.2 08/22/2017   HGB 8.2 (L) 08/22/2017   HCT 25.8 (L) 08/22/2017   MCV 89.3 08/22/2017   PLT 101 (L) 08/22/2017   Lab Results  Component Value Date   CREATININE 7.94 (H) 08/24/2017   BUN 31 (H) 08/24/2017   NA 134 (L) 08/24/2017   K 4.0 08/24/2017   CL 97 (L) 08/24/2017   CO2 22 08/24/2017   Lab Results  Component Value Date   INR 1.88 08/24/2017   INR 2.34 08/23/2017   INR 2.43 08/22/2017   Echo: - Left ventricle: The cavity size was normal. Wall thickness was   increased in a pattern of mild LVH. Systolic function was normal.   The estimated ejection fraction was in the range of 60% to 65%.   Wall motion was normal; there were no regional wall motion   abnormalities. Doppler parameters are consistent with abnormal   left ventricular  relaxation (grade 1 diastolic dysfunction). The   E/e&' ratio is between 8-15, suggesting indeterminate LV filling   pressure. - Ventricular septum: Septal motion showed abnormal function and   dyssynergy. The contour showed diastolic flattening and systolic   flattening. - Aortic valve: Moderate calcified aortic stenosis. Mean gradient   (S): 25 mm Hg. Peak gradient (S): 40 mm Hg. Valve area (VTI):   1.15 cm^2. Valve area (Vmax): 1.11 cm^2. Valve area (Vmean): 1.16   cm^2. - Mitral valve: Calcified annulus. Mildly thickened leaflets . Mild   to moderate stenosis. Mean gradient (D): 8 mm Hg. Valve area by   continuity equation (using LVOT flow): 1.25 cm^2. - Left atrium: The atrium was mildly dilated. - Right ventricle: The cavity size was moderately dilated. - Right atrium: Severely dilated. - Tricuspid valve: There was moderate regurgitation. - Pulmonary arteries: PA peak pressure: 59 mm Hg (S). - Inferior vena cava: The vessel was dilated. The respirophasic   diameter changes were blunted (< 50%), consistent with elevated   central venous pressure.  Anesthesia Physical  Anesthesia Plan  ASA: IV  Anesthesia Plan: General   Post-op Pain Management:    Induction: Intravenous  PONV Risk Score and Plan: 4 or greater and Ondansetron, Dexamethasone, Treatment may vary due to age or medical condition and Midazolam  Airway Management Planned: Oral ETT  Additional Equipment:   Intra-op Plan:   Post-operative Plan: Extubation in OR and Possible Post-op intubation/ventilation  Informed Consent: I have reviewed the patients History and Physical, chart, labs and discussed the procedure including the risks, benefits and alternatives for the proposed anesthesia with the patient or authorized representative who has indicated his/her understanding and acceptance.   Dental advisory given  Plan Discussed with: CRNA  Anesthesia Plan Comments:       Anesthesia Quick Evaluation

## 2017-08-24 NOTE — Consult Note (Addendum)
PULMONARY / CRITICAL CARE MEDICINE   Name: Adam [name]. MRN: [id number] DOB: 04-01-1961    ADMISSION DATE:  08/17/2017 CONSULTATION DATE:  08/24/17  REFERRING MD:  Dr. Velvet Bathe   CHIEF COMPLAINT:  Dyspnea   HISTORY OF PRESENT ILLNESS:   Patient is a 57 y.o male with ESRD on HD (home HD 5 days per week), hypertension, nonischemic cardiomyopathy s/p AICD placement, atrial fibrillation on coumadin, pulmonary HTN who presented to the ED on 4/08 with 1 week of progressive fevers, dyspnea, cough, and chest congestion. Associated symptoms include N/V, diarrhea, and abdominal pain. Denies erythema or tenderness over his fistula. In the ED he was initially found to be febrile, tachycardic, and hypotensive. Blood cultures on 04/09 positive were positive for MSSA. Infectious disease was consulted and given his AICD recommended lead removal and further evaluation of his AV fistula. His left arm fistula duplex with mixed echogenicity concerning for hematoma vs infection of the AV graft. The patient has a TTE on 4/09 without definitive evidence of endocarditis. He subsequently underwent AICD lead extraction on 4/11. The patient continued to have minimal clinical improvement and was taken for left AV graft removal and ligation with temporary HD catheter placement on 4/15. Op note illustrates that a pocket of pus proximal to the ruptured segment was found with necrotic subcutaneous tissue adjacent to the tissue. Cultures were sent. Post-op the patient was unable to be extubated due to hypoxia and transferred to [facility] for further evaluation and management.   PAST MEDICAL HISTORY :  He  has a past medical history of AICD (automatic cardioverter/defibrillator) present, Aortic stenosis, Arthritis, Atrial fibrillation ([place]), ESRD (end stage renal disease) on dialysis ([place]), Great toe amputation status ([place]), History of blood transfusion (2009), Hypertension, Hypothyroidism (acquired), Mitral stenosis, Nonischemic  cardiomyopathy ([place]), Pneumonia (2014; 08/05/2016), PONV (postoperative nausea and vomiting), Pulmonary HTN ([place]), Renal insufficiency, and Thyroid cancer ([place]) (2011).  PAST SURGICAL HISTORY: He  has a past surgical history that includes Thyroidectomy (2011); Dialysis fistula creation (Left, 2003); Kidney transplant (2009); Laparoscopic gastric banding (2006); Hernia repair; Revison of arteriovenous fistula (Left, 12/25/2014); Thrombectomy w/ embolectomy (Left, 12/25/2014); Toe amputation (Bilateral); Umbilical hernia repair (X 2); Knee surgery (Bilateral, 2000s); Cardiac defibrillator placement (2009); Cardiac catheterization (Right, 2017); Prostate biopsy (2009); Revison of arteriovenous fistula (Left, 11/10/2016); Insertion of dialysis catheter (N/A, 11/11/2016); Icd lead removal (Right, 08/20/2017); and TEE without cardioversion (N/A, 08/20/2017).  Allergies  Allergen Reactions  . Enalaprilat Swelling    Mouth swelling  . Vasotec [Enalapril] Swelling    MOUTH SWELLING  . Ivp Dye [Iodinated Diagnostic Agents] Nausea And Vomiting   No current facility-administered medications on file prior to encounter.    Current Outpatient Medications on File Prior to Encounter  Medication Sig  . ambrisentan (LETAIRIS) 10 MG tablet Take 1 tablet (10 mg total) by mouth daily.  Marland Kitchen amiodarone (PACERONE) 200 MG tablet Take 200 mg by mouth every evening.   Marland Kitchen atorvastatin (LIPITOR) 40 MG tablet Take 1 tablet (40 mg total) by mouth every evening.  . cinacalcet (SENSIPAR) 90 MG tablet Take 90 mg by mouth every evening.   Marland Kitchen levothyroxine (SYNTHROID, LEVOTHROID) 300 MCG tablet Take 1 tablet (300 mcg total) by mouth daily.  . midodrine (PROAMATINE) 10 MG tablet Take 1 tablet (10 mg total) by mouth 3 (three) times daily.  . tadalafil, PAH, (ADCIRCA) 20 MG tablet 1 tab daily  . warfarin (COUMADIN) 6 MG tablet Take 6-9 mg by mouth See admin instructions. Take 1.5 tablets (  9 mg) by mouth in the evening, except take 1 tablet (6  mg) in the evening on Sunday.   FAMILY HISTORY:  His indicated that his mother is deceased. He indicated that his father is deceased.  SOCIAL HISTORY: He  reports that he has never smoked. He has never used smokeless tobacco. He reports that he drinks alcohol. He reports that he does not use drugs.  REVIEW OF SYSTEMS:   12 point ROS preformed. All negative aside from those mentioned in the HPI.  SUBJECTIVE:  Patient is heavily sedated.  According to anesthesia, patient became hypotensive with anesthesia initiation and was unable to be extubated due to hypoxia. He was sent out on neo for pressure support.   VITAL SIGNS: BP (!) 151/76   Pulse 76   Temp 97.7 F (36.5 C) (Oral)   Resp (!) 24   Ht 6\' 1"  (1.854 m)   Wt 280 lb 13.9 oz (127.4 kg)   SpO2 100%   BMI 37.06 kg/m   HEMODYNAMICS:    VENTILATOR SETTINGS: Vent Mode: PRVC FiO2 (%):  [50 %] 50 % Set Rate:  [18 bmp] 18 bmp Vt Set:  [640 mL] 640 mL PEEP:  [5 cmH20] 5 cmH20 Plateau Pressure:  [25 cmH20] 25 cmH20  INTAKE / OUTPUT: I/O last 3 completed shifts: In: 240 [P.O.:240] Out: -   PHYSICAL EXAMINATION:  General:  Obese male, sedated Neuro:  Sedated, does not currently respond to verbal or physical stimuli  HEENT:  Normocephalic, atraumatic, moist mucus membranes  Cardiovascular:  RRR, systolic murmur noted Lungs:  Diffuse rhonchi throughout   Abdomen:  Active bowel sounds, soft, non-distended, no tenderness to palpation  Musculoskeletal:  Mild LE edema, bandage on left UE is wet and blood tinged.  Skin:  Warm and dry   LABS: BMET Recent Labs  Lab 08/22/17 0257 08/23/17 0410 08/24/17 0358  NA 134* 136 134*  K 3.9 4.0 4.0  CL 96* 98* 97*  CO2 24 27 22   BUN 37* 25* 31*  CREATININE 8.57* 6.59* 7.94*  GLUCOSE 129* 84 76   Electrolytes Recent Labs  Lab 08/22/17 0257 08/23/17 0410 08/24/17 0358  CALCIUM 7.9* 7.7* 7.8*  PHOS 3.3 2.5 2.8   CBC Recent Labs  Lab 08/20/17 2053 08/21/17 0432  08/22/17 0656  WBC 6.7 4.7 6.2  HGB 8.7* 8.1* 8.2*  HCT 27.3* 25.2* 25.8*  PLT 80* 78* 101*   Coag's Recent Labs  Lab 08/22/17 0257 08/23/17 0410 08/24/17 0358  INR 2.43 2.34 1.88   Sepsis Markers No results for input(s): LATICACIDVEN, PROCALCITON, O2SATVEN in the last 168 hours.  ABG Recent Labs  Lab 08/20/17 1655  PHART 7.403  PCO2ART 41.7  PO2ART 144.0*   Liver Enzymes Recent Labs  Lab 08/22/17 0257 08/23/17 0410 08/24/17 0358  ALBUMIN 2.9* 2.7* 2.9*   Cardiac Enzymes No results for input(s): TROPONINI, PROBNP in the last 168 hours.  Glucose No results for input(s): GLUCAP in the last 168 hours.  Imaging No results found.  CULTURES: 4/08: Blood cultures positive with MSSA 4/09: Repeat blood cultures with NGTD 4/15: Surgical cultures pending   ANTIBIOTICS: Azithromycin 4/8 -> stopped 4/9 Cefazolin 4/8 -> current  Ceftriaxone 4/8 -> stopped 4/9  Vancomycin 4/8 -> stopped 4/9   SIGNIFICANT EVENTS: Patient admitted with sepsis on 4/08 >>> Blood cultures positive for MSSA 4/09 >>> TTE without definitive evidence for endocarditis 4/09 >>> AICD lead extraction on 4/11 >>> TEE performed on 4/11, results pending >>> Left AV graft removal  and ligation with temporary HD catheter placement on 4/15 >>>  LINES/TUBES: Right external IJ 4/14  Right femoral HD cath 4/15  ET 4/15   ASSESSMENT / PLAN: Patient is a 57 y.o male with ESRD on HD (home HD 5 days per week), hypertension, nonischemic cardiomyopathy s/p AICD placement, atrial fibrillation on coumadin, pulmonary HTN who presented to the ED on 4/08 with MSSA bacteremia secondary to an infected left AV graft. He has subsequently had his ACID leads extracted on 4/11 and went for left AV graft ligation and removal on 4/15.   INFECTIOUS A:   MSSA Bacteremia   P:   ID following  TEE pending but will need 6 weeks IV abx regardless  Continue Cefazolin    PULMONARY A: Acute Hypoxic Respiratory Failure    P:   RASS: -4 Currently on precedex for sedation  PRN fentanyl  Wean sedation with RASS goal of -1  ABG illustrating the patient is still hypoxic on PEEP 5 and FiO2 of 40%. Increase to PEEP of 8 and FiO2 50% VAP precautions   CARDIOVASCULAR A:  Atrial Fibrillation on Coumadin  Nonischemic cardiomyopathy previous AICD placement removed 4/11 Aortic Stenosis  H/O Hypotension    P:  Currently on Neo. Wean to maintain MAP >60  Continue amiodarone  Restart coumadin  RENAL A:  ESRD on HD 2/2 uncontrolled HTN  P:   Nephrology onboard.   GASTROINTESTINAL A: Nutrition  GI ppx    P:   Hold off on tube feeds GI ppx added   HEMATOLOGIC A:  Anemia of chronic disease Thrombocytopenia   P:  Epo per renal  Continue to monitor CBC  ENDOCRINE A: H/o Hypothyroidism   P:   Continue synthroid   NEUROLOGIC A: Sedation   P:   RASS goal: -1 Wean sedation as tolerated.   Pulmonary and [place] Pager: [phone]  08/24/2017, 4:37 PM  Attending Note:  57 year old ESRD on home HD who presents to PCCM hypoxemic post AV fistual removal due to bacteremia.  Post procedure patient was hypoxemic and hypotensive and decision was made to keep patient intubated and was brought to the ICU and PCCM was asked to consult for vent management.  On exam, BS are clear bilaterally.  CXR is pending.  ABG and labs reviewed.  HD planned for AM.  Patient is currently paralyzed so will hold off weaning for now.  Will SBT in AM.  Titrate neo to off hopefully as anesthesia wares off.  Precedex for sedation.  PRN fentanyl for pain.  PCCM will continue to follow.  The patient is critically ill with multiple organ systems failure and requires high complexity decision making for assessment and support, frequent evaluation and titration of therapies, application of advanced monitoring technologies and extensive interpretation of multiple databases.   Critical Care Time  devoted to patient care services described in this note is  35  Minutes. This time reflects time of care of this signee Dr Jennet Maduro. This critical care time does not reflect procedure time, or teaching time or supervisory time of PA/NP/Med student/Med Resident etc but could involve care discussion time.  Rush Farmer, M.D. [facility] Pulmonary/Critical Care Medicine. Pager: [phone]. After hours pager: [phone].

## 2017-08-24 NOTE — Transfer of Care (Signed)
Immediate Anesthesia Transfer of Care Note  Patient: Adam [name].  Procedure(s) Performed: REVISON OF ARTERIOVENOUS FISTULA LEFT (Left Arm Upper) INSERTION OF DIALYSIS CATHETER (Left Groin) VEIN HARVEST (Left Arm Upper)  Patient Location: ICU  Anesthesia Type:General  Level of Consciousness: sedated and Patient remains intubated per anesthesia plan  Airway & Oxygen Therapy: Patient remains intubated per anesthesia plan  Post-op Assessment: Report given to RN and Post -op Vital signs reviewed and stable  Post vital signs: Reviewed and stable  Last Vitals:  Vitals Value Taken Time  BP    Temp    Pulse 91 08/24/2017  4:16 PM  Resp 18 08/24/2017  4:16 PM  SpO2 100 % 08/24/2017  4:16 PM  Vitals shown include unvalidated device data.  Last Pain:  Vitals:   08/24/17 1130  TempSrc:   PainSc: 0-No pain         Complications: cardiovascular complications

## 2017-08-24 NOTE — Progress Notes (Signed)
[facility] Progress Note   Subjective:  L arm pain and bleeding from his access this am.  Otherwise feeling ok.   Objective Vitals:   08/23/17 2347 08/24/17 0446 08/24/17 0625 08/24/17 0807  BP: 94/60 (!) 82/58  (!) 83/52  Pulse: 81 78  76  Resp: 20 19    Temp: 98.3 F (36.8 C) 98.2 F (36.8 C)  97.7 F (36.5 C)  TempSrc: Oral Oral  Oral  SpO2: 100% 90%  100%  Weight:   127.4 kg (280 lb 13.9 oz)   Height:       Physical Exam General:NAD, obese, male, laying in bed Heart:RRR, +5/6 systolic murmur Lungs:CTAB anterior/laterally Abdomen:soft, NTND Extremities:no LE edema Dialysis Access: LU AVF - +b/t, small area of thinning with skin tear  Filed Weights   08/22/17 1107 08/23/17 0452 08/24/17 0625  Weight: 126.4 kg (278 lb 10.6 oz) 126.2 kg (278 lb 3.5 oz) 127.4 kg (280 lb 13.9 oz)    Intake/Output Summary (Last 24 hours) at 08/24/2017 0936 Last data filed at 08/23/2017 1241 Gross per 24 hour  Intake 240 ml  Output -  Net 240 ml    Additional Objective Labs: Basic Metabolic Panel: Recent Labs  Lab 08/22/17 0257 08/23/17 0410 08/24/17 0358  NA 134* 136 134*  K 3.9 4.0 4.0  CL 96* 98* 97*  CO2 24 27 22   GLUCOSE 129* 84 76  BUN 37* 25* 31*  CREATININE 8.57* 6.59* 7.94*  CALCIUM 7.9* 7.7* 7.8*  PHOS 3.3 2.5 2.8   Liver Function Tests: Recent Labs  Lab 08/22/17 0257 08/23/17 0410 08/24/17 0358  ALBUMIN 2.9* 2.7* 2.9*   CBC: Recent Labs  Lab 08/19/17 0249 08/20/17 0354  08/20/17 2053 08/21/17 0432 08/22/17 0656  WBC 7.9 7.2  --  6.7 4.7 6.2  HGB 9.5* 8.9*   < > 8.7* 8.1* 8.2*  HCT 30.0* 28.0*   < > 27.3* 25.2* 25.8*  MCV 90.1 92.1  --  91.3 91.0 89.3  PLT 60* 78*  --  80* 78* 101*   < > = values in this interval not displayed.   Blood Culture    Component Value Date/Time   SDES EXPECTORATED SPUTUM 08/18/2017 2132   SPECREQUEST NONE 08/18/2017 2132   CULT  08/18/2017 0305    NO GROWTH 5 DAYS Performed at [place], [place] [address] [place], [place] [address]    CULT  08/18/2017 0305    NO GROWTH 5 DAYS Performed at [facility], [place] [address], [place], [place] [address]    REPTSTATUS 08/18/2017 FINAL 08/18/2017 2132     Lab Results  Component Value Date   INR 1.88 08/24/2017   INR 2.34 08/23/2017   INR 2.43 08/22/2017   Studies/Results: No results found.  Medications: . sodium chloride    . sodium chloride 10 mL/hr at 08/21/17 1802  . sodium chloride    .  ceFAZolin (ANCEF) IV 1 g (08/23/17 1741)   . ambrisentan  10 mg Oral Daily  . amiodarone  200 mg Oral QPM  . atorvastatin  40 mg Oral QPM  . calcitRIOL  0.25 mcg Oral Daily  . calcium acetate  1,334 mg Oral TID WC  . Chlorhexidine Gluconate Cloth  6 each Topical Daily  . cinacalcet  90 mg Oral QPM  . darbepoetin (ARANESP) injection - DIALYSIS  150 mcg Intravenous Q Sat-HD  . iopamidol      . levothyroxine  300 mcg Oral QAC  breakfast  . midodrine  10 mg Oral 3 times per day  . Warfarin - Pharmacist Dosing Inpatient   Does not apply q1800    Dialysis Orders: Home Hemo - 3hrs, 5 day/wk  Brief Summary: 57 year old BM with multiple medical problems including ESRD- Presenting with fever and shortness of breath with elevated white count and low blood pressure  Assessment/Plan: 1. MSSA bacteremia - suspected ICD infection s/p ICD removal on 4/12.  Defervesced on IV Ancef. [facility] 4/9 NGTD. ?LU AVF infection, fractured stent, pseudoaneurysm, hematoma vs infection. To OR today for exploratory surgery.  2. ESRD - on Home HD 3hrs 5d/wk.  Last HD sat.  Plan for HD tomorrow (Tuesday). 3. Anemia of CKD- Hgb 8.2, cont Aranesp 183mcg qwk (Sat) FOllow trends 4. Secondary hyperparathyroidism - CCa 8.7. P 2.8. Continue binders, VDRA and sensipar.  5. HTN/volume - Chronic hypotension, on midodrine. Appears euvolemic on exam, 2L over EDW, titrate down volume as tolerated.  6. Nutrition - Alb 2.9. NPO today.  Renal diet w/Fluid restrictions once  advanced. Nepro. Renavite.  Jen Mow, PA-C [facility] Pager: [phone] 08/24/2017,9:36 AM  LOS: 7 days   Pt seen, examined and agree w A/P as above.  Kelly Splinter MD [employer] pager [phone]   08/24/2017, 9:37 AM

## 2017-08-24 NOTE — Progress Notes (Signed)
Received notification that patient will be moving to 2 Heart. Called patient's wife and notified her of patient's room change.

## 2017-08-24 NOTE — Progress Notes (Addendum)
Went to Short Stay with Ander Purpura, Unit Secretary to pick up Telemetry Box, Wires and Patient belongings. In belonging bag are patient's glasses, shorts, wedding ring and phone charger. Patient belonging bag was packed by short stay. Put patient belonging bag in cabinet in patient's room #22. Closed patient door. Patient will be going to the OR.

## 2017-08-24 NOTE — Care Management Note (Signed)
Case Management Note [name] RN, BSN Unit 4E-Case Manager [phone]  Patient Details  Name: Adam [name]. MRN: [id number] Date of Birth: March 06, 1961  Subjective/Objective:  Pt admitted with Fever, positive for MSSA with sepsis, ICD infection s/p removal-                  Action/Plan: PTA pt lived at home with wife- hx of ESRD with home HD m-f- per ID notes pt will need 6 wks of IV abx- pt will need orders for IV abx and set up with [facility] prior to discharge to assist with home IV abx needs- CM to follow, Life Vest has already been arranged and is at the bedside for discharge.   Expected Discharge Date:  (unknown)               Expected Discharge Plan:  [place]  In-House Referral:  NA  Discharge planning Services  CM Consult  Post Acute Care Choice:  Home Health, Durable Medical Equipment Choice offered to:  Patient, Spouse  DME Arranged:  Life vest DME Agency:  [facility]  HH Arranged:  IV Antibiotics HH Agency:     Status of Service:  In process, will continue to follow  If discussed at Long Length of Stay Meetings, dates discussed:    Discharge Disposition:   Additional Comments:  Dawayne Patricia, RN 08/24/2017, 2:12 PM

## 2017-08-24 NOTE — Anesthesia Procedure Notes (Signed)
Procedure Name: Intubation Date/Time: 08/24/2017 12:42 PM Performed by: Renato Shin, CRNA Pre-anesthesia Checklist: Patient identified, Emergency Drugs available, Suction available and Patient being monitored Patient Re-evaluated:Patient Re-evaluated prior to induction Oxygen Delivery Method: Circle system utilized Preoxygenation: Pre-oxygenation with 100% oxygen Induction Type: IV induction Ventilation: Mask ventilation without difficulty Laryngoscope Size: Miller and 3 Grade View: Grade I Tube type: Oral Tube size: 7.5 mm Number of attempts: 1 Airway Equipment and Method: Stylet Placement Confirmation: ETT inserted through vocal cords under direct vision,  positive ETCO2,  CO2 detector and breath sounds checked- equal and bilateral Secured at: 21 cm Tube secured with: Tape Dental Injury: Teeth and Oropharynx as per pre-operative assessment  Comments: Oral intubation after unsuccessful LMA placement. Pt upper denture plate removed after induction. Given to OR nurse

## 2017-08-24 NOTE — Op Note (Addendum)
OPERATIVE NOTE   PROCEDURE: 1. Cannulation of right common femoral artery under ultrasound guidance 2. Placement of right femoral arterial line 3. Incision and drainage of left upper arm abscess 4. Excision of distal half of left brachiocephalic arteriovenous fistula with stent included 5. Vein patch angioplasty of left brachial artery 6. Cannulation of left femoral vein under ultrasound guidance 7. Placement of left femoral tunneled dialysis catheter   PRE-OPERATIVE DIAGNOSIS: possible infected left brachiocephalic arteriovenous fistula with infected stent  POST-OPERATIVE DIAGNOSIS: same as above   SURGEON: Adele Barthel, MD  ASSISTANT(S): Leontine Locket, PAC   ANESTHESIA: general  ESTIMATED BLOOD LOSS: 100 cc  FINDING(S): 1.  Ruptured fistula with fractured stent with associated pseudoaneurysm 2.  Evidence of multiple prior plications 3.  Pocket of pus proximal to ruptured segment with frankly necrotic subcutaneous tissue adjacent to fistula 4.  Diffusely thickened fistula wall with calcified plaque imbedded in fistula wall 5.  No evidence of infection of anastomosis or proximal half of fistula 6.  Tip of tunneled dialysis catheter in inferior vena cava/right atrium  SPECIMEN(S):  Anaerobic and aerobic specimens ruptured segment and abscess cavity.  INDICATIONS:   Adam [name]. is a 57 y.o. male with long-standing left brachiocephalic arteriovenous fistula that has undergone multiple salvage procedures, both endovascular and open, who presents with possible infected left brachiocephalic arteriovenous fistula.  Dr. Donzetta Matters, Dr. Trula Slade, and I recommended: exploration of left arm, possible ligation of fistula vs revision, and possible placement of tunneled dialysis catheter.  Risk, benefits, and alternatives to access surgery were discussed.  The patient is aware the risks include but are not limited to: bleeding, infection, steal syndrome, nerve damage, ischemic monomelic  neuropathy, thrombosis, failure to mature, complications related to venous hypertension, need for additional procedures, death and stroke.  The patient is aware the risks of tunneled dialysis catheter placement include but are not limited to: bleeding, infection, central venous injury, pneumothorax, possible venous stenosis, possible malpositioning in the venous system, and possible infections related to long-term catheter presence. The patient was aware of these risks and agreed to proceed.   DESCRIPTION: After obtaining full informed written consent, the patient was brought back to the operating room and placed supine upon the operating table.  The patient received IV antibiotics prior to induction.  Almost immediately after induction, patient's blood pressure became extremely hypotensive.  Vasopressin and epinephrine were given to stabilize the patient's blood pressure.  To guide titration of vasopressors, Anesthesia attempted a right radial and brachial arterial lines but were unsuccessful.  To facilitate arterial monitoring, I agreed to place an arterial line in the right common femoral artery.  The right groin was prepped and draped.  I cannulated the right common femoral artery with a micropuncture needle under Sonosite guidance.  I placed the microwire into the common femoral artery and then exchanged the needle for the microsheath.  I then removed the dilator and wire and then placed the J-wire from an arterial line kit.  I dilated the skin tract and venotomy with the dilator in the kit.  The long floppy arterial line would not advance, so I replaced the dilator and then exchanged the wire for the J-wire in a 5-Fr sheath kit.  Over this wire, I loaded a 5-Fr sheath which was advanced into the right common femoral artery.  I aspirated and flushed this sheath.  The side port was connected to the arterial circuit to transduce the blood pressure.  The sheath was secured with  a 2-0 silk stitch and then a  biopatch applied around the sheath entrance and a Tegraderm applied to the sheath.  At this point, the patient was beginning to stabilize with vasopressors.  A procedure time out was completed and the correct surgical site was verified.  After obtaining adequate anesthesia, the patient was prepped and draped in the standard fashion for: left arm access procedure.  I turned my attention to the anastomosis.  I made a longitudinal incision over the anastomosis.  I dissected out the anastomosis with electrocautery slowly due to the extensive scarring.  There was a large brachial vein overlying the anastomosis, so I dissected this out and ligated it proximally and distally for use as a vein patch.  I transected this segment of brachial vein and soaked it in heparinized saline.  At this point, I was able to fully dissect out the brachial artery proximal and distal to the anastomosis.  I could feel the stented segment of the fistula.  There was no obvious infection in this field.   At this point, I gave the patient 8000 units of Heparin.  Along with the coumadin active in this patient, I felt this was adequate anticoagulation.  I wrapped the upper arm with an Esmark bandage to act as a venous tourniquet.  I made a longitudinal incision over the attentuated skin segment.  Immediately, pressurized blood drained.  Essentially this segment of fistula with fracture stent had ruptured and was being contained by a pseudoaneurysm.  I took anaerobic and aerobic cultures of this ruptured segment.  I tried to gently tease out the stent but this was fractured with broken segments of the uncovered stent in this portion of the fistula.  I removed the portions of the stent that I could easily extract.  I extended the incision distal toward the anastomosis.  This segment did not look frankly infected.  I continued removing stent fragments.  This stent did not appear to be a covered stent.    At this point, I carried the dissection  more proximally.  I encountered a frank pocket of pus, so additional anaerobic and aerobic cultures were obtained.  I extended the dissection another 5 cm more proximal.  The tissue more proximal look uninfected and viable.  The subcutaneous tissue surrounding the abscess cavity was somewhat necrotic in appearance.  I decided that the distal half of the fistula was going to need to be excised due to concerns with possible infection.  I dissected out circumferentially the fistula in this exposed segment.  I transected the fistula proximal to the necrotic segment.  I then oversewed the the fistula proximally with a double layer of 5-0 Prolene.    I then dissected out the distal half of this fistula with electrocautery.  I placed the brachial artery under tension proximally and distally.  I sharply removed the mid-segment of the fistula and then transected the fistula distal to the stent segment, removing the remainder of the distal half of this fistula.  This removed the cephalic vein off the brachial artery.  There was small calcific plaques within the orifice of the artery.  I did a limited endarterectomy to remove this small amount of calcification.    I then spatulated the brachial vein segment previously harvested and then tailored it for this arteriotomy.  I sewed the vein onto the left brachial artery with a running stitch of 5-0 Prolene.  Prior to completion, the left brachial artery was bleeding proximally and distally.  No thrombus was noted.    At this point, the patient was given 40 mg of Protamine and 2 units of fresh frozen plasma to reverse Heparin and residual coumadin.  All incisions were washed out and packed with Avitene.  I turned my attention to the proximal vein.  There was no active bleeding.  I placed 2-0 Vicryl stitches in the subcutaneous tissue to cover up the vein from the central wound cavity.  I placed a few staples to reapproximate the skin.    I turned my attention to the  arterial exposure.  I sewed a purse-string stitch of 2-0 Vicryl to close the tunnel between the mid-segment exposure and the arterial exposure.  The subcutaneous tissue was reapproximated with a double layer of 3-0 Vicryl and then the skin reapproximated with staples.    The mid-segment exposure was washed out and skin bleeding controlled with electrocautery.  The wound was packed with a wet-to-dry dressing consisting of two sterile 4x4 and normal saline.  Dry dressings were applied to the mid-segment exposure and arterial exposure.  These dressings were kept in place with a Kerlix dressing wrapped around the arm.    At this point, the patient was reprepped and redraped for a femoral tunneled dialysis catheter placement.  After discussion with Anesthesia, it was felt avoidance of atrial irritation was essential given the patient's presumed septic shock.  Under ultrasound guidance, the left femoral vein was cannulated with a 18 gauge needle.  A J-wire was then placed into the inferior vena cava under fluroscopic guidance.  The wire was then secured in place with a clamp to the drapes.    I then made stab incisions at the cannulation and exit sites.  I dissected from the exit site to the cannulation site with a metal dissector.  The wire was then unclamped and I removed the needle.  The skin tract and venotomy was dilated serially with graduated dilators.  Finally, the dilator-sheath was placed under fluroscopic guidance into the iliac vein.  The dilator and wire were removed.    A 55 cm Palindrome catheter was placed under fluoroscopic guidance into the right atrium.  The sheath was broken and peeled away while holding the catheter cuff at the level of the skin.  The catheter was clamped with the plastic clamp.  The back of the catheter was transected revealing the two lumen.  The dissector was docked onto one of these lumens.  The dissector with the back end of this catheter was pulled through the  subcutaneous tunnel.  The catheter was again clamped and the back end of the catheter was transected again to reveal the two lumens again.  The catheter collar was placed over the catheter and then two ports loaded onto the two lumens.  The catheter collar was snapped into place, securing the two ports.  The catheter was pulled into appropriate position under fluoroscopy.  Each port was tested by aspirating and flushing.  No resistance was noted.  Each port was then thoroughly flushed with heparinized saline.  The catheter was secured in placed with two interrupted stitches of 3-0 Nylon tied to the catheter.  The cannulation incision was closed with a U-stitch of 4-0 Monocryl.  The cannulation and exit incisions were cleaned and sterile bandages applied.  Each port was then loaded with concentrated heparin (1000 Units/mL) at the manufacturer recommended volumes to each port.  Sterile caps were applied to each port.    On completion fluoroscopy, the tips of  the catheter were in the right atrium, and there was no evidence of pneumothorax.   COMPLICATIONS: none  CONDITION: critical   Adele Barthel, MD, FACS [facility] Office: [phone] Pager: [phone]  08/24/2017, 3:12 PM

## 2017-08-24 NOTE — Anesthesia Postprocedure Evaluation (Signed)
Anesthesia Post Note  Patient: Adam [name].  Procedure(s) Performed: REVISON OF ARTERIOVENOUS FISTULA LEFT (Left Arm Upper) INSERTION OF DIALYSIS CATHETER (Left Groin) VEIN HARVEST (Left Arm Upper)     Patient location during evaluation: SICU Anesthesia Type: General Level of consciousness: sedated Pain management: pain level controlled Vital Signs Assessment: post-procedure vital signs reviewed and stable Respiratory status: patient remains intubated per anesthesia plan Cardiovascular status: stable Postop Assessment: no apparent nausea or vomiting Anesthetic complications: no    Last Vitals:  Vitals:   08/24/17 1559 08/24/17 1630  BP: (!) 151/76   Pulse: 76 81  Resp: (!) 24 (!) 21  Temp:  36.7 C  SpO2: 100% 99%    Last Pain:  Vitals:   08/24/17 1630  TempSrc: Oral  PainSc:                  Adam [name]

## 2017-08-24 NOTE — Progress Notes (Deleted)
[facility] Progress Note   Subjective:  L arm pain and bleeding from his access this am.  Otherwise feeling ok.   Objective Vitals:   08/23/17 2347 08/24/17 0446 08/24/17 0625 08/24/17 0807  BP: 94/60 (!) 82/58  (!) 83/52  Pulse: 81 78  76  Resp: 20 19    Temp: 98.3 F (36.8 C) 98.2 F (36.8 C)  97.7 F (36.5 C)  TempSrc: Oral Oral  Oral  SpO2: 100% 90%  100%  Weight:   127.4 kg (280 lb 13.9 oz)   Height:       Physical Exam General:NAD, obese, male, laying in bed Heart:RRR, +3/4 systolic murmur Lungs:CTAB anterior/laterally Abdomen:soft, NTND Extremities:no LE edema Dialysis Access: LU AVF - +b/t, small area of thinning with skin tear  Filed Weights   08/22/17 1107 08/23/17 0452 08/24/17 0625  Weight: 126.4 kg (278 lb 10.6 oz) 126.2 kg (278 lb 3.5 oz) 127.4 kg (280 lb 13.9 oz)    Intake/Output Summary (Last 24 hours) at 08/24/2017 0915 Last data filed at 08/23/2017 1241 Gross per 24 hour  Intake 240 ml  Output -  Net 240 ml    Additional Objective Labs: Basic Metabolic Panel: Recent Labs  Lab 08/22/17 0257 08/23/17 0410 08/24/17 0358  NA 134* 136 134*  K 3.9 4.0 4.0  CL 96* 98* 97*  CO2 24 27 22   GLUCOSE 129* 84 76  BUN 37* 25* 31*  CREATININE 8.57* 6.59* 7.94*  CALCIUM 7.9* 7.7* 7.8*  PHOS 3.3 2.5 2.8   Liver Function Tests: Recent Labs  Lab 08/22/17 0257 08/23/17 0410 08/24/17 0358  ALBUMIN 2.9* 2.7* 2.9*   CBC: Recent Labs  Lab 08/19/17 0249 08/20/17 0354  08/20/17 2053 08/21/17 0432 08/22/17 0656  WBC 7.9 7.2  --  6.7 4.7 6.2  HGB 9.5* 8.9*   < > 8.7* 8.1* 8.2*  HCT 30.0* 28.0*   < > 27.3* 25.2* 25.8*  MCV 90.1 92.1  --  91.3 91.0 89.3  PLT 60* 78*  --  80* 78* 101*   < > = values in this interval not displayed.   Blood Culture    Component Value Date/Time   SDES EXPECTORATED SPUTUM 08/18/2017 2132   SPECREQUEST NONE 08/18/2017 2132   CULT  08/18/2017 0305    NO GROWTH 5 DAYS Performed at [facility], [place] [address] [place], [place] [address]    CULT  08/18/2017 0305    NO GROWTH 5 DAYS Performed at [facility], [place] [address], [place], [place] [address]    REPTSTATUS 08/18/2017 FINAL 08/18/2017 2132     Lab Results  Component Value Date   INR 1.88 08/24/2017   INR 2.34 08/23/2017   INR 2.43 08/22/2017   Studies/Results: No results found.  Medications: . sodium chloride    . sodium chloride 10 mL/hr at 08/21/17 1802  . sodium chloride    .  ceFAZolin (ANCEF) IV 1 g (08/23/17 1741)   . ambrisentan  10 mg Oral Daily  . amiodarone  200 mg Oral QPM  . atorvastatin  40 mg Oral QPM  . calcitRIOL  0.25 mcg Oral Daily  . calcium acetate  1,334 mg Oral TID WC  . Chlorhexidine Gluconate Cloth  6 each Topical Daily  . cinacalcet  90 mg Oral QPM  . darbepoetin (ARANESP) injection - DIALYSIS  150 mcg Intravenous Q Sat-HD  . iopamidol      . levothyroxine  300 mcg Oral QAC  breakfast  . midodrine  10 mg Oral 3 times per day  . Warfarin - Pharmacist Dosing Inpatient   Does not apply q1800    Dialysis Orders: Home Hemo - 3hrs, 5 day/wk  Brief Summary: 57 year old BM with multiple medical problems including ESRD- Presenting with fever and shortness of breath with elevated white count and low blood pressure  Assessment/Plan: 1. MSSA bacteremia - ?ICD infection s/p ICD removal on 4/12.  Defervesced on IV Ancef. [facility] 4/9 NGTD. ?LU AVF infection, fractured stent, pseudoaneurysm, hematoma vs infection. To OR today for exploratory surgery. 2. ESRD - on Home HD 3hrs 5d/wk.  Last HD sat.  Plan for HD tomorrow. 3. Anemia of CKD- Hgb 8.2, cont Aranesp 174mcg qwk (Sat) FOllow trends 4. Secondary hyperparathyroidism - CCa 8.7. P 2.8. Continue binders, VDRA and sensipar.  5. HTN/volume - Chronic hypotension, on midodrine. Appears euvolemic on exam, 2L over EDW, titrate down volume as tolerated.  6. Nutrition - Alb 2.9. NPO today.  Renal diet w/Fluid restrictions once advanced. Nepro.  Renavite.  Jen Mow, PA-C [facility] Pager: [phone] 08/24/2017,9:15 AM  LOS: 7 days

## 2017-08-24 NOTE — Progress Notes (Signed)
VSS  Patient taken for fistula revision by vascular surgery. On IV antibiotics  Will reassess next am  Velvet Bathe, MD

## 2017-08-24 NOTE — Progress Notes (Addendum)
  Progress Note    08/24/2017 7:36 AM 4 Days Post-Op  Subjective:  L arm remains sore   Vitals:   08/23/17 2347 08/24/17 0446  BP: 94/60 (!) 82/58  Pulse: 81 78  Resp: 20 19  Temp: 98.3 F (36.8 C) 98.2 F (36.8 C)  SpO2: 100% 90%   Physical Exam: Lungs:  Non labored Extremities:  Palpable thrill and audible bruit L arm fistula; skin tear over cannulization site of fistula, firm to palpation, no purulence noted; L hand grip strength in tact Abdomen:  Soft Neurologic: A&O  CBC    Component Value Date/Time   WBC 6.2 08/22/2017 0656   RBC 2.89 (L) 08/22/2017 0656   HGB 8.2 (L) 08/22/2017 0656   HCT 25.8 (L) 08/22/2017 0656   PLT 101 (L) 08/22/2017 0656   MCV 89.3 08/22/2017 0656   MCH 28.4 08/22/2017 0656   MCHC 31.8 08/22/2017 0656   RDW 18.4 (H) 08/22/2017 0656   LYMPHSABS 0.5 (L) 08/17/2017 0902   MONOABS 0.4 08/17/2017 0902   EOSABS 0.0 08/17/2017 0902   BASOSABS 0.0 08/17/2017 0902    BMET    Component Value Date/Time   NA 134 (L) 08/24/2017 0358   K 4.0 08/24/2017 0358   CL 97 (L) 08/24/2017 0358   CO2 22 08/24/2017 0358   GLUCOSE 76 08/24/2017 0358   BUN 31 (H) 08/24/2017 0358   CREATININE 7.94 (H) 08/24/2017 0358   CREATININE 4.91 (H) 08/13/2015 1223   CALCIUM 7.8 (L) 08/24/2017 0358   GFRNONAA 7 (L) 08/24/2017 0358   GFRNONAA 13 (L) 07/27/2014 1539   GFRAA 8 (L) 08/24/2017 0358   GFRAA 15 (L) 07/27/2014 1539    INR    Component Value Date/Time   INR 1.88 08/24/2017 0358     Intake/Output Summary (Last 24 hours) at 08/24/2017 0736 Last data filed at 08/23/2017 1241 Gross per 24 hour  Intake 240 ml  Output -  Net 240 ml     Assessment/Plan:  57 y.o. male with MSSA bacteremia and possible infection L arm fistula 4 Days Post-Op   Coumadin held overnight; INR 1.88 this morning Thin skin in area of cannulization now with skin tear Unsure if hematoma vs infection; Dr. Trula Slade will evaluate this morning Keep NPO   Dagoberto Ligas,  PA-C [facility] [phone] 08/24/2017 7:36 AM  I agree with the above.  I have seen and evaluated the patient.  He has developed a skin tear and some bleeding over top of his fistula.  On examination, this looks significantly different than it did several days ago.  The skin is shiny now over top of the area of concern.  I discussed with the patient that I think that this needs exploration in the operating room.  If this is found to be infected, he would require resection of the fistula segments was to have stents on the inside.  This could potentially require reconstruction of the brachial artery.  He is at high risk for needing this fistula removed.  If it turns out to not be infected, he potentially could have a interposition graft placed in order to salvage the fistula.  I also discussed with him that he could end up with a catheter.  I am getting a CT angiogram this morning to better evaluate his anatomy.  His INR is down to 1.8 today.  He is on the OR schedule for later this afternoon.  Annamarie Major

## 2017-08-25 ENCOUNTER — Inpatient Hospital Stay (HOSPITAL_COMMUNITY): Payer: Medicare HMO

## 2017-08-25 ENCOUNTER — Encounter (HOSPITAL_COMMUNITY): Payer: Self-pay | Admitting: Vascular Surgery

## 2017-08-25 ENCOUNTER — Encounter: Payer: Self-pay | Admitting: Pulmonary Disease

## 2017-08-25 DIAGNOSIS — T827XXD Infection and inflammatory reaction due to other cardiac and vascular devices, implants and grafts, subsequent encounter: Secondary | ICD-10-CM

## 2017-08-25 DIAGNOSIS — G9340 Encephalopathy, unspecified: Secondary | ICD-10-CM

## 2017-08-25 DIAGNOSIS — T829XXD Unspecified complication of cardiac and vascular prosthetic device, implant and graft, subsequent encounter: Secondary | ICD-10-CM

## 2017-08-25 DIAGNOSIS — I959 Hypotension, unspecified: Secondary | ICD-10-CM

## 2017-08-25 DIAGNOSIS — R011 Cardiac murmur, unspecified: Secondary | ICD-10-CM

## 2017-08-25 LAB — RENAL FUNCTION PANEL
ALBUMIN: 3 g/dL — AB (ref 3.5–5.0)
ANION GAP: 16 — AB (ref 5–15)
BUN: 40 mg/dL — ABNORMAL HIGH (ref 6–20)
CALCIUM: 7.6 mg/dL — AB (ref 8.9–10.3)
CO2: 20 mmol/L — ABNORMAL LOW (ref 22–32)
Chloride: 99 mmol/L — ABNORMAL LOW (ref 101–111)
Creatinine, Ser: 9.22 mg/dL — ABNORMAL HIGH (ref 0.61–1.24)
GFR, EST AFRICAN AMERICAN: 7 mL/min — AB (ref >60)
GFR, EST NON AFRICAN AMERICAN: 6 mL/min — AB (ref >60)
Glucose, Bld: 81 mg/dL (ref 65–99)
PHOSPHORUS: 3.5 mg/dL (ref 2.5–4.6)
Potassium: 4 mmol/L (ref 3.5–5.1)
Sodium: 135 mmol/L (ref 135–145)

## 2017-08-25 LAB — GLUCOSE, CAPILLARY
GLUCOSE-CAPILLARY: 81 mg/dL (ref 65–99)
GLUCOSE-CAPILLARY: 81 mg/dL (ref 65–99)
GLUCOSE-CAPILLARY: 94 mg/dL (ref 65–99)
Glucose-Capillary: 65 mg/dL (ref 65–99)
Glucose-Capillary: 69 mg/dL (ref 65–99)
Glucose-Capillary: 102 mg/dL — ABNORMAL HIGH (ref 65–99)
Glucose-Capillary: 63 mg/dL — ABNORMAL LOW (ref 65–99)
Glucose-Capillary: 113 mg/dL — ABNORMAL HIGH (ref 65–99)
Glucose-Capillary: 66 mg/dL (ref 65–99)

## 2017-08-25 LAB — BPAM FFP
BLOOD PRODUCT EXPIRATION DATE: 201904172359
BLOOD PRODUCT EXPIRATION DATE: 201904172359
BLOOD PRODUCT EXPIRATION DATE: 201904182359
ISSUE DATE / TIME: 201904151345
ISSUE DATE / TIME: 201904151519
ISSUE DATE / TIME: 201904151345
UNIT TYPE AND RH: 600
UNIT TYPE AND RH: 6200
Unit Type and Rh: 600

## 2017-08-25 LAB — CBC
HCT: 27.4 % — ABNORMAL LOW (ref 39.0–52.0)
Hemoglobin: 8.7 g/dL — ABNORMAL LOW (ref 13.0–17.0)
MCH: 28.1 pg (ref 26.0–34.0)
MCHC: 31.8 g/dL (ref 30.0–36.0)
MCV: 88.4 fL (ref 78.0–100.0)
PLATELETS: 118 10*3/uL — AB (ref 150–400)
RBC: 3.1 MIL/uL — ABNORMAL LOW (ref 4.22–5.81)
RDW: 18.8 % — AB (ref 11.5–15.5)
WBC: 4.8 10*3/uL (ref 4.0–10.5)

## 2017-08-25 LAB — PREPARE FRESH FROZEN PLASMA
UNIT DIVISION: 0
Unit division: 0

## 2017-08-25 LAB — BLOOD GAS, ARTERIAL
Acid-Base Excess: 0.2 mmol/L (ref 0.0–2.0)
BICARBONATE: 23.2 mmol/L (ref 20.0–28.0)
Drawn by: 23703
FIO2: 50
MECHVT: 640 mL
O2 Saturation: 98.5 %
PEEP: 8 cmH2O
Patient temperature: 98.6
RATE: 18 resp/min
pCO2 arterial: 30.6 mmHg — ABNORMAL LOW (ref 32.0–48.0)
pH, Arterial: 7.493 — ABNORMAL HIGH (ref 7.350–7.450)
pO2, Arterial: 115 mmHg — ABNORMAL HIGH (ref 83.0–108.0)

## 2017-08-25 LAB — PROTIME-INR
INR: 1.53
Prothrombin Time: 18.2 seconds — ABNORMAL HIGH (ref 11.4–15.2)

## 2017-08-25 LAB — MAGNESIUM: Magnesium: 1.8 mg/dL (ref 1.7–2.4)

## 2017-08-25 MED ORDER — ORAL CARE MOUTH RINSE: 15.0000 mL | Freq: Two times a day (BID) | OROMUCOSAL | Status: DC

## 2017-08-25 MED ORDER — SODIUM CHLORIDE 0.9 % IV BOLUS
250.0000 mL | Freq: Once | INTRAVENOUS | Status: AC
Start: 2017-08-25 — End: 2017-08-26
  Administered 2017-08-25: 250 mL via INTRAVENOUS

## 2017-08-25 MED ORDER — WARFARIN SODIUM 3 MG PO TABS
6.0000 mg | ORAL_TABLET | Freq: Once | ORAL | Status: AC
  Administered 2017-08-25: 6 mg via ORAL
  Filled 2017-08-25: qty 2

## 2017-08-25 NOTE — Progress Notes (Signed)
PCCM INTERVAL PROGRESS NOTE  Patient extubated about 45 mins ago. Tolerating very well. Phonates well. Protecting airway well. No respiratory distress. Maintaining adequate O2 sat on minimal O2.   Georgann Housekeeper, AGACNP-BC [facility] Pager [phone] or [phone]  08/25/2017 9:10 AM

## 2017-08-25 NOTE — Progress Notes (Addendum)
[facility] Progress Note   Subjective:   Feeling ok.  C/os sore throat, extubated this AM.   Objective Vitals:   08/25/17 0830 08/25/17 0840 08/25/17 0900 08/25/17 1000  BP:  (!) 93/48    Pulse: 66 70 69 66  Resp: 17 16 (!) 22 (!) 21  Temp:      TempSrc:      SpO2: 100% 100% 100% 100%  Weight:      Height:       Physical Exam General:NAD, chronically ill appear male Heart:RRR Lungs:breath sounds diminished b/l, nml WOB on 3L Abdomen:soft, NTND Extremities: no LE edema, RUE wrapped Dialysis Access: R [facility] c/d/i  [facility] Weights   08/24/17 0625 08/24/17 1800 08/25/17 0615  Weight: 127.4 kg (280 lb 13.9 oz) 128.5 kg (283 lb 4.7 oz) 129.6 kg (285 lb 11.5 oz)    Intake/Output Summary (Last 24 hours) at 08/25/2017 1106 Last data filed at 08/25/2017 1000 Gross per 24 hour  Intake 1787.33 ml  Output 100 ml  Net 1687.33 ml    Additional Objective Labs: Basic Metabolic Panel: Recent Labs  Lab 08/24/17 0358 08/24/17 1654 08/25/17 0420  NA 134* 134* 135  K 4.0 4.7 4.0  CL 97* 99* 99*  CO2 22 22 20*  GLUCOSE 76 91 81  BUN 31* 38* 40*  CREATININE 7.94* 8.62* 9.22*  CALCIUM 7.8* 7.4* 7.6*  PHOS 2.8 3.2 3.5   Liver Function Tests: Recent Labs  Lab 08/24/17 0358 08/24/17 1654 08/25/17 0420  ALBUMIN 2.9* 3.0* 3.0*   No results for input(s): LIPASE, AMYLASE in the last 168 hours. CBC: Recent Labs  Lab 08/20/17 2053 08/21/17 0432 08/22/17 0656 08/24/17 1654 08/25/17 0420  WBC 6.7 4.7 6.2 7.4 4.8  HGB 8.7* 8.1* 8.2* 8.7* 8.7*  HCT 27.3* 25.2* 25.8* 27.3* 27.4*  MCV 91.3 91.0 89.3 89.2 88.4  PLT 80* 78* 101* 136* 118*   Blood Culture    Component Value Date/Time   SDES WOUND UPPER ARM PROXIMAL FISTULA 08/24/2017 1412   SPECREQUEST SPEC B ON SWABS 08/24/2017 1412   CULT PENDING 08/24/2017 1412   REPTSTATUS PENDING 08/24/2017 1412    Cardiac Enzymes: No results for input(s): CKTOTAL, CKMB, CKMBINDEX, TROPONINI in the last 168  hours. CBG: Recent Labs  Lab 08/24/17 1656 08/24/17 1954 08/24/17 2352 08/25/17 0821  GLUCAP 87 94 81 81   Iron Studies: No results for input(s): IRON, TIBC, TRANSFERRIN, FERRITIN in the last 72 hours. Lab Results  Component Value Date   INR 1.53 08/25/2017   INR 1.88 08/24/2017   INR 2.34 08/23/2017   Studies/Results: Dg Chest Port 1 View  Result Date: 08/25/2017 CLINICAL DATA:  ET tube EXAM: PORTABLE CHEST 1 VIEW COMPARISON:  08/24/2017 FINDINGS: Endotracheal tube is unchanged. Interval placement of NG tube into the stomach. Cardiomegaly. Increasing bilateral perihilar and lower lobe opacities, likely edema. Suspect small layering effusions. IMPRESSION: Worsening bilateral perihilar and lower lobe opacities, likely edema/CHF. Layering effusions. Electronically Signed   By: Rolm Baptise M.D.   On: 08/25/2017 07:49   Portable Chest X-ray  Result Date: 08/24/2017 CLINICAL DATA:  Endotracheal tube placement EXAM: PORTABLE CHEST 1 VIEW COMPARISON:  08/21/2017 FINDINGS: 1700 hours. Endotracheal tube tip is 5.1 cm above the base of the carina. The cardio pericardial silhouette is enlarged. Inferiorly placed dialysis catheter tip overlies the right atrium. Central parahilar airspace opacity bilaterally suggests edema. Retrocardiac atelectasis or infiltrate evident. Vascular stent device noted in the left axillary region. Telemetry leads overlie the chest.  IMPRESSION: 1. Endotracheal tube tip 5.1 cm above the base of the carina. 2. Enlargement of the cardiopericardial silhouette with probable pulmonary edema. 3. Left base atelectasis/infiltrate with potential tiny left pleural effusion. Electronically Signed   By: Misty Stanley M.D.   On: 08/24/2017 17:26   Dg Abd Portable 1v  Result Date: 08/24/2017 CLINICAL DATA:  57 y/o  M; enteric tube placement. EXAM: PORTABLE ABDOMEN - 1 VIEW COMPARISON:  None. FINDINGS: Normal bowel gas pattern. Vascular calcifications. Enteric tube tip projects over  gastric body. Mild levocurvature of lumbar spine. IMPRESSION: Enteric tube tip projects over gastric body. Electronically Signed   By: Kristine Garbe M.D.   On: 08/24/2017 18:18    Medications: . sodium chloride    . sodium chloride 10 mL/hr at 08/24/17 2000  . sodium chloride    . sodium chloride    . sodium chloride    .  ceFAZolin (ANCEF) IV 1 g (08/23/17 1741)  . dexmedetomidine (PRECEDEX) IV infusion Stopped (08/25/17 0805)  . famotidine (PEPCID) IV Stopped (08/24/17 1856)   . ambrisentan  10 mg Oral Daily  . amiodarone  200 mg Oral QPM  . atorvastatin  40 mg Oral QPM  . calcitRIOL  0.25 mcg Oral Daily  . calcium acetate  1,334 mg Oral TID WC  . chlorhexidine gluconate (MEDLINE KIT)  15 mL Mouth Rinse BID  . cinacalcet  90 mg Oral QPM  . darbepoetin (ARANESP) injection - DIALYSIS  150 mcg Intravenous Q Sat-HD  . levothyroxine  300 mcg Oral QAC breakfast  . mouth rinse  15 mL Mouth Rinse BID  . midodrine  10 mg Oral 3 times per day  . mupirocin ointment  1 application Nasal BID  . Warfarin - Pharmacist Dosing Inpatient   Does not apply q1800    Dialysis Orders: Home Hemo - 3hrs, 5 day/wk  Brief Summary: 57 year old BM with multiple medical problems including ESRD- Presenting with fever and shortness of breath with elevated white count and low blood pressure    Assessment/Plan: 1. MSSA bacteremia -  ruptured AVF w/pseudoaneurysm and proximal pus pocket w/necrotic subQ tissue s/p excision of distal half of L BC AVF & stent and I&D abscess by Dr. Bridgett Larsson on 4/16. s/p ICD removal on 4/12 d/t suspected source of infection.  Defervesced on IV Ancef. [facility] 4/9 NGTD.  2. ESRD - on Home HD 3hrs 5d/wk.  Last HD sat.  Plan for HD today at bedside via [facility] placed on 4/16 after excision of LU AVF.  3. Anemia of CKD- Hgb 8.7, cont Aranesp 130mg qwk (Sat) FOllow trends 4. Secondary hyperparathyroidism - Ca and P in goal. Continue binders, VDRA and sensipar.  5. HTN/volume - Chronic  hypotension, on midodrine. Bp elevated for patient.  CXR shows interstitial edema. Plan for UF goal 2-3L today. 6. Nutrition - Alb 2.9. Renal diet w/Fluid restrictions. Nepro. Renavite.  LJen Mow PA-C [facility] Pager: [phone] 58734/16/2019,11:06 AM  LOS: 8 days   Pt seen, examined and agree w A/P as above.  RKelly SplinterMD CNewell Rubbermaidpager [phone]  08/25/2017, 1:44 PM

## 2017-08-25 NOTE — Progress Notes (Signed)
ANTICOAGULATION CONSULT NOTE - Follow Up Consult  Pharmacy Consult for warfarin (Afib)   Allergies  Allergen Reactions  . Enalaprilat Swelling    Mouth swelling  . Vasotec [Enalapril] Swelling    MOUTH SWELLING  . Ivp Dye [Iodinated Diagnostic Agents] Nausea And Vomiting    Patient Measurements: Height: _0  (185.4 cm) Weight: 285 lb 11.5 oz (129.6 kg) IBW/kg (Calculated) : 79.9 Heparin Dosing Weight: 106.7 kg  Vital Signs: Temp: 97.7 F (36.5 C) (04/16 1241) Temp Source: Oral (04/16 1241) BP: 93/48 (04/16 0840) Pulse Rate: 67 (04/16 1300)  Labs: Recent Labs    08/23/17 0410 08/24/17 0358 08/24/17 1654 08/25/17 0420  HGB  --   --  8.7* 8.7*  HCT  --   --  27.3* 27.4*  PLT  --   --  136* 118*  LABPROT 25.4* 21.4*  --  18.2*  INR 2.34 1.88  --  1.53  CREATININE 6.59* 7.94* 8.62* 9.22*    Estimated Creatinine Clearance: 12.9 mL/min (A) (by C-G formula based on SCr of 9.22 mg/dL (H)).   Medications:  Scheduled:  . ambrisentan  10 mg Oral Daily  . amiodarone  200 mg Oral QPM  . atorvastatin  40 mg Oral QPM  . calcitRIOL  0.25 mcg Oral Daily  . calcium acetate  1,334 mg Oral TID WC  . chlorhexidine gluconate (MEDLINE KIT)  15 mL Mouth Rinse BID  . cinacalcet  90 mg Oral QPM  . darbepoetin (ARANESP) injection - DIALYSIS  150 mcg Intravenous Q Sat-HD  . levothyroxine  300 mcg Oral QAC breakfast  . mouth rinse  15 mL Mouth Rinse BID  . midodrine  10 mg Oral 3 times per day  . mupirocin ointment  1 application Nasal BID  . Warfarin - Pharmacist Dosing Inpatient   Does not apply q1800   Infusions:  . sodium chloride    . sodium chloride 5 mL/hr at 08/25/17 1200  . sodium chloride    . sodium chloride    . sodium chloride    .  ceFAZolin (ANCEF) IV 1 g (08/23/17 1741)  . dexmedetomidine (PRECEDEX) IV infusion Stopped (08/25/17 0805)    Assessment/Plan Patient is on warfarin PTA for Afib, INR up to 4.93 this admission and given vitamin K 5 mg on 4/10.  Infected ICD was removed 4/12.   Vascular proceeded with procedure on I&D of fistula on 4/15 and placement of TDC.   Warfarin held 4/14 and 4/15. Ok per CCM to restart at this time.  INR down to 1.5 today. Vitamin K given 4/14.   Goal of Therapy:  INR 2-3 Monitor platelets by anticoagulation protocol: Yes   Plan: Warfarin 37m tonight Continue daily INR  FErin HearingPharmD., BCPS Clinical Pharmacist 08/25/2017 1:15 PM

## 2017-08-25 NOTE — Progress Notes (Signed)
Hypoglycemic Event  CBG: 65  Treatment: 4 oz regular soda   Symptoms: None  Follow-up CBG: Time: 1306 CBG Result: 69  Will recheck in 15 minutes.  Possible Reasons for Event: Pt is NPO  Comments/MD notified: None - pt is no longer NPO - follow up CBG's will be done.    Ihor Dow

## 2017-08-25 NOTE — Progress Notes (Signed)
[facility] Progress Note Patient Name: Adam [name]. DOB: 1961-03-30 MRN: [id number]   Date of Service  08/25/2017  HPI/Events of Note  Hypotensive SBP 70-80 No symptoms Got dialyzed today with removal of 2.5 lt  eICU Interventions  250 cc NS slow infusion.       Intervention Category Major Interventions: Hypotension - evaluation and management  Akiera Allbaugh 08/25/2017, 10:49 PM

## 2017-08-25 NOTE — Progress Notes (Signed)
Pt assessed. A-Line removed. VSS. Report given. Pt transported on tele. RN and pt tech at bedside. Care transferred.

## 2017-08-25 NOTE — Progress Notes (Signed)
INFECTIOUS DISEASE PROGRESS NOTE  ID: Adam [name]. is a 57 y.o. male with  Principal Problem:   MSSA bacteremia Active Problems:   Atrial fibrillation [I48.91]   Pulmonary hypertension (HCC)   End-stage renal disease ([place])   Acute respiratory failure with hypoxia (HCC)   Hypothyroidism   Sepsis ([place])   Complication of arteriovenous dialysis fistula   Stage 5 chronic kidney disease on chronic dialysis ([place])   Infected defibrillator (HCC)  Subjective: No complaints.   Abtx:  Anti-infectives (From admission, onward)   Start     Dose/Rate Route Frequency Ordered Stop   08/24/17 1230  ceFAZolin (ANCEF) 3,000 mg in dextrose 5 % 100 mL IVPB     3,000 mg 260 mL/hr over 30 Minutes Intravenous To Surgery 08/24/17 1151 08/24/17 1404   08/24/17 1148  ceFAZolin (ANCEF) 2-4 GM/100ML-% IVPB    Note to Pharmacy:  Debbe Bales, Meredit: cabinet override      08/24/17 1148 08/24/17 1720   08/20/17 1300  gentamicin (GARAMYCIN) 80 mg in sodium chloride 0.9 % 500 mL irrigation     80 mg Irrigation To ShortStay Surgical 08/19/17 1351 08/20/17 1511   08/20/17 1200  ceFAZolin (ANCEF) 3 g in dextrose 5 % 50 mL IVPB  Status:  Discontinued     3 g 130 mL/hr over 30 Minutes Intravenous To ShortStay Surgical 08/19/17 1351 08/20/17 1954   08/18/17 1800  ceFAZolin (ANCEF) IVPB 1 g/50 mL premix     1 g 100 mL/hr over 30 Minutes Intravenous Every 24 hours 08/18/17 0035     08/18/17 1000  cefTRIAXone (ROCEPHIN) 1 g in sodium chloride 0.9 % 100 mL IVPB  Status:  Discontinued     1 g 200 mL/hr over 30 Minutes Intravenous Every 24 hours 08/17/17 1151 08/18/17 0035   08/18/17 0100  ceFAZolin (ANCEF) IVPB 2g/100 mL premix     2 g 200 mL/hr over 30 Minutes Intravenous  Once 08/18/17 0035 08/18/17 0127   08/17/17 1600  azithromycin (ZITHROMAX) tablet 500 mg  Status:  Discontinued     500 mg Oral Daily 08/17/17 1505 08/18/17 1053   08/17/17 1200  vancomycin (VANCOCIN) 2,000 mg in sodium chloride 0.9 %  500 mL IVPB     2,000 mg 250 mL/hr over 120 Minutes Intravenous  Once 08/17/17 1144 08/17/17 1526   08/17/17 1115  cefTRIAXone (ROCEPHIN) 1 g in sodium chloride 0.9 % 100 mL IVPB     1 g 200 mL/hr over 30 Minutes Intravenous  Once 08/17/17 1111 08/17/17 1313   08/17/17 1115  azithromycin (ZITHROMAX) tablet 500 mg     500 mg Oral  Once 08/17/17 1111 08/17/17 1139      Medications:  Scheduled: . ambrisentan  10 mg Oral Daily  . amiodarone  200 mg Oral QPM  . atorvastatin  40 mg Oral QPM  . calcitRIOL  0.25 mcg Oral Daily  . calcium acetate  1,334 mg Oral TID WC  . chlorhexidine gluconate (MEDLINE KIT)  15 mL Mouth Rinse BID  . Chlorhexidine Gluconate Cloth  6 each Topical Daily  . cinacalcet  90 mg Oral QPM  . darbepoetin (ARANESP) injection - DIALYSIS  150 mcg Intravenous Q Sat-HD  . levothyroxine  300 mcg Oral QAC breakfast  . mouth rinse  15 mL Mouth Rinse 10 times per day  . midodrine  10 mg Oral 3 times per day  . mupirocin ointment  1 application Nasal BID  . Warfarin - Pharmacist Dosing Inpatient  Does not apply q1800    Objective: Vital signs in last 24 hours: Temp:  [97.6 F (36.4 C)-98 F (36.7 C)] 97.8 F (36.6 C) (04/16 0824) Pulse Rate:  [61-81] 70 (04/16 0840) Resp:  [10-27] 16 (04/16 0840) BP: (93-151)/(48-76) 93/48 (04/16 0840) SpO2:  [97 %-100 %] 100 % (04/16 0840) Arterial Line BP: (108-164)/(50-89) 115/61 (04/16 0800) FiO2 (%):  [50 %] 50 % (04/16 0755) Weight:  [128.5 kg (283 lb 4.7 oz)-129.6 kg (285 lb 11.5 oz)] 129.6 kg (285 lb 11.5 oz) (04/16 0615)   General appearance: alert, cooperative, fatigued and no distress Resp: diminished breath sounds anterior - bilateral Cardio: regular rate and rhythm and systolic M GI: normal findings: bowel sounds normal and soft, non-tender Extremities: edema no LE edema. LUE wrapped.   Lab Results Recent Labs    08/24/17 1654 08/25/17 0420  WBC 7.4 4.8  HGB 8.7* 8.7*  HCT 27.3* 27.4*  NA 134* 135  K 4.7  4.0  CL 99* 99*  CO2 22 20*  BUN 38* 40*  CREATININE 8.62* 9.22*   Liver Panel Recent Labs    08/24/17 1654 08/25/17 0420  ALBUMIN 3.0* 3.0*   Sedimentation Rate No results for input(s): ESRSEDRATE in the last 72 hours. C-Reactive Protein No results for input(s): CRP in the last 72 hours.  Microbiology: Recent Results (from the past 240 hour(s))  Blood culture (routine x 2)     Status: Abnormal   Collection Time: 08/17/17  9:22 AM  Result Value Ref Range Status   Specimen Description   Final    BLOOD RIGHT FOREARM Performed at [facility] [address] [place], [place] [address]    Special Requests   Final    BOTTLES DRAWN AEROBIC AND ANAEROBIC Blood Culture adequate volume Performed at [facility] [address] [place], [place] [address]    Culture  Setup Time   Final    GRAM POSITIVE COCCI IN BOTH AEROBIC AND ANAEROBIC BOTTLES Organism ID to follow CRITICAL RESULT CALLED TO, READ BACK BY AND VERIFIED WITH: A MASTERS PHARMD 08/18/17 0009 JDW Performed at [facility], [place] [address] [place], [place] [address]    Culture STAPHYLOCOCCUS AUREUS (A)  Final   Report Status 08/20/2017 FINAL  Final   Organism ID, Bacteria STAPHYLOCOCCUS AUREUS  Final      Susceptibility   Staphylococcus aureus - MIC*    CIPROFLOXACIN <=0.5 SENSITIVE Sensitive     ERYTHROMYCIN >=8 RESISTANT Resistant     GENTAMICIN <=0.5 SENSITIVE Sensitive     OXACILLIN 0.5 SENSITIVE Sensitive     TETRACYCLINE <=1 SENSITIVE Sensitive     VANCOMYCIN <=0.5 SENSITIVE Sensitive     TRIMETH/SULFA <=10 SENSITIVE Sensitive     CLINDAMYCIN <=0.25 SENSITIVE Sensitive     RIFAMPIN <=0.5 SENSITIVE Sensitive     Inducible Clindamycin NEGATIVE Sensitive     * STAPHYLOCOCCUS AUREUS  Blood Culture ID Panel (Reflexed)     Status: Abnormal   Collection Time: 08/17/17  9:22 AM  Result Value Ref Range Status   Enterococcus species NOT DETECTED NOT DETECTED Final    Listeria monocytogenes NOT DETECTED NOT DETECTED Final   Staphylococcus species DETECTED (A) NOT DETECTED Final    Comment: CRITICAL RESULT CALLED TO, READ BACK BY AND VERIFIED WITH: A MASTERS PHARMD 08/18/17 0009 JDW    Staphylococcus aureus DETECTED (A) NOT DETECTED Final    Comment: Methicillin (oxacillin) susceptible Staphylococcus aureus (MSSA). Preferred therapy is anti staphylococcal beta  lactam antibiotic (Cefazolin or Nafcillin), unless clinically contraindicated. CRITICAL RESULT CALLED TO, READ BACK BY AND VERIFIED WITH: A MASTERS PHARMD 08/18/17 0009 JDW    Methicillin resistance NOT DETECTED NOT DETECTED Final   Streptococcus species NOT DETECTED NOT DETECTED Final   Streptococcus agalactiae NOT DETECTED NOT DETECTED Final   Streptococcus pneumoniae NOT DETECTED NOT DETECTED Final   Streptococcus pyogenes NOT DETECTED NOT DETECTED Final   Acinetobacter baumannii NOT DETECTED NOT DETECTED Final   Enterobacteriaceae species NOT DETECTED NOT DETECTED Final   Enterobacter cloacae complex NOT DETECTED NOT DETECTED Final   Escherichia coli NOT DETECTED NOT DETECTED Final   Klebsiella oxytoca NOT DETECTED NOT DETECTED Final   Klebsiella pneumoniae NOT DETECTED NOT DETECTED Final   Proteus species NOT DETECTED NOT DETECTED Final   Serratia marcescens NOT DETECTED NOT DETECTED Final   Haemophilus influenzae NOT DETECTED NOT DETECTED Final   Neisseria meningitidis NOT DETECTED NOT DETECTED Final   Pseudomonas aeruginosa NOT DETECTED NOT DETECTED Final   Candida albicans NOT DETECTED NOT DETECTED Final   Candida glabrata NOT DETECTED NOT DETECTED Final   Candida krusei NOT DETECTED NOT DETECTED Final   Candida parapsilosis NOT DETECTED NOT DETECTED Final   Candida tropicalis NOT DETECTED NOT DETECTED Final    Comment: Performed at [facility], [place]. [address] [place], [place] [address]  Blood culture (routine x 2)     Status: Abnormal   Collection Time: 08/17/17  9:52 AM  Result  Value Ref Range Status   Specimen Description   Final    BLOOD RIGHT FOREARM Performed at [facility] [address], [place], [place] [address]    Special Requests   Final    BOTTLES DRAWN AEROBIC AND ANAEROBIC Blood Culture adequate volume Performed at [facility] [address] [place], [place] [address]    Culture  Setup Time   Final    GRAM POSITIVE COCCI IN BOTH AEROBIC AND ANAEROBIC BOTTLES CRITICAL RESULT CALLED TO, READ BACK BY AND VERIFIED WITH: A MASTERS PHARMD 08/18/17 00009 JDW    Culture (A)  Final    STAPHYLOCOCCUS AUREUS SUSCEPTIBILITIES PERFORMED ON PREVIOUS CULTURE WITHIN THE LAST 5 DAYS. Performed at [facility], [place] [address], [place], [place] [address]    Report Status 08/20/2017 FINAL  Final  MRSA PCR Screening     Status: None   Collection Time: 08/17/17  8:40 PM  Result Value Ref Range Status   MRSA by PCR NEGATIVE NEGATIVE Final    Comment:        The GeneXpert MRSA Assay (FDA approved for NASAL specimens only), is one component of a comprehensive MRSA colonization surveillance program. It is not intended to diagnose MRSA infection nor to guide or monitor treatment for MRSA infections. Performed at [facility], [place] [address], [place], [place] [address]   Culture, blood (Routine X 2) w Reflex to ID Panel     Status: None   Collection Time: 08/18/17  3:05 AM  Result Value Ref Range Status   Specimen Description BLOOD RIGHT HAND  Final   Special Requests   Final    BOTTLES DRAWN AEROBIC ONLY Blood Culture adequate volume   Culture   Final    NO GROWTH 5 DAYS Performed at [facility], [place] [name] [place],  [address]    Report Status 08/23/2017 FINAL  Final  Culture, blood (Routine X 2) w Reflex to ID Panel  Status: None   Collection Time: 08/18/17  3:05 AM  Result Value Ref Range Status   Specimen Description BLOOD RIGHT HAND  Final   Special Requests   Final     BOTTLES DRAWN AEROBIC ONLY Blood Culture adequate volume   Culture   Final    NO GROWTH 5 DAYS Performed at [facility], [address], [place], [place] [address]    Report Status 08/23/2017 FINAL  Final  Culture, sputum-assessment     Status: None   Collection Time: 08/18/17  9:32 PM  Result Value Ref Range Status   Specimen Description EXPECTORATED SPUTUM  Final   Special Requests NONE  Final   Sputum evaluation   Final    RESULT CALLED TO, READ BACK BY AND VERIFIED WITH: Miguel Dibble RN [id number] 08/18/17 A BROWNING Performed at [facility], [place] [address] [place], [place] [address]    Report Status 08/18/2017 FINAL  Final  Surgical PCR screen     Status: Abnormal   Collection Time: 08/20/17 10:11 AM  Result Value Ref Range Status   MRSA, PCR NEGATIVE NEGATIVE Final   Staphylococcus aureus POSITIVE (A) NEGATIVE Final    Comment: (NOTE) The Xpert SA Assay (FDA approved for NASAL specimens in patients 76 years of age and older), is one component of a comprehensive surveillance program. It is not intended to diagnose infection nor to guide or monitor treatment. Performed at [facility], [place] [address], [place], [place] [address]   Aerobic/Anaerobic Culture (surgical/deep wound)     Status: None (Preliminary result)   Collection Time: 08/24/17  2:06 PM  Result Value Ref Range Status   Specimen Description WOUND  Final   Special Requests RUTURED AV FISTULA  Final   Gram Stain   Final    NO WBC SEEN NO ORGANISMS SEEN Performed at [facility], [place] [address] [place], [place] [address]    Culture PENDING  Incomplete   Report Status PENDING  Incomplete  Aerobic/Anaerobic Culture (surgical/deep wound)     Status: None (Preliminary result)   Collection Time: 08/24/17  2:12 PM  Result Value Ref Range Status   Specimen Description WOUND UPPER ARM PROXIMAL FISTULA  Final   Special Requests SPEC B ON SWABS  Final   Gram Stain   Final    FEW WBC PRESENT,  PREDOMINANTLY PMN NO ORGANISMS SEEN Performed at [facility], [place] [address], [place], [place] [address]    Culture PENDING  Incomplete   Report Status PENDING  Incomplete    Studies/Results: Dg Chest Port 1 View  Result Date: 08/25/2017 CLINICAL DATA:  ET tube EXAM: PORTABLE CHEST 1 VIEW COMPARISON:  08/24/2017 FINDINGS: Endotracheal tube is unchanged. Interval placement of NG tube into the stomach. Cardiomegaly. Increasing bilateral perihilar and lower lobe opacities, likely edema. Suspect small layering effusions. IMPRESSION: Worsening bilateral perihilar and lower lobe opacities, likely edema/CHF. Layering effusions. Electronically Signed   By: Rolm Baptise M.D.   On: 08/25/2017 07:49   Portable Chest X-ray  Result Date: 08/24/2017 CLINICAL DATA:  Endotracheal tube placement EXAM: PORTABLE CHEST 1 VIEW COMPARISON:  08/21/2017 FINDINGS: 1700 hours. Endotracheal tube tip is 5.1 cm above the base of the carina. The cardio pericardial silhouette is enlarged. Inferiorly placed dialysis catheter tip overlies the right atrium. Central parahilar airspace opacity bilaterally suggests edema. Retrocardiac atelectasis or infiltrate evident. Vascular stent device noted in the left axillary region. Telemetry leads overlie the chest. IMPRESSION: 1.  Endotracheal tube tip 5.1 cm above the base of the carina. 2. Enlargement of the cardiopericardial silhouette with probable pulmonary edema. 3. Left base atelectasis/infiltrate with potential tiny left pleural effusion. Electronically Signed   By: Misty Stanley M.D.   On: 08/24/2017 17:26   Dg Abd Portable 1v  Result Date: 08/24/2017 CLINICAL DATA:  57 y/o  M; enteric tube placement. EXAM: PORTABLE ABDOMEN - 1 VIEW COMPARISON:  None. FINDINGS: Normal bowel gas pattern. Vascular calcifications. Enteric tube tip projects over gastric body. Mild levocurvature of lumbar spine. IMPRESSION: Enteric tube tip projects over gastric body. Electronically Signed   By:  Kristine Garbe M.D.   On: 08/24/2017 18:18     Assessment/Plan: MSSA bacteremia ESRD ICD (removed lead 4-11) HD fistula/graft (removed 4-15), pus and necrotic tissue VDRF (extubated this AM)  Total days of antibiotics: 8 ancef   Plan is for 6 weeks of ancef with HD TEE pending (TTE 4-9 negative) Improving Repeat BCx are ngtd          Bobby Rumpf MD, FACP Infectious Diseases (pager) [phone] [email or web address] 08/25/2017, 9:44 AM  LOS: 8 days

## 2017-08-25 NOTE — Progress Notes (Addendum)
[facility]  Subjective  - Intubated, alert answers with nods of the head.   Objective (!) 151/76 66 97.6 F (36.4 C) (Oral) 18 100%  Intake/Output Summary (Last 24 hours) at 08/25/2017 0736 Last data filed at 08/25/2017 0600 Gross per 24 hour  Intake 1711.93 ml  Output 100 ml  Net 1611.93 ml    Left arm dressing in place, minimal SS drainage Left radial doppler brisk Left hand warm with 5/5 grip strength Left fem catheter intact Left LE Doppler DP/PT brisk Heart   Assessment/Planning: POD # 1 Ligation of left UE fistula frank pus with necrotic tissue noted on op note and placement of left femoral catheter. AICD recommended lead removal  Blood cultures on 04/09 positive were positive for MSSA. No growth to date intraoperative cultures Cont. Ancef IV q 24 h.  Left UE dressing in place will change it tomorrow.  Radial doppler brisk left hand well perfused.   Left LE well perfused with intact femoral catheter. We will follow for future permanent HD needs once stable and infection free.  Roxy Horseman 08/25/2017 7:36 AM --  Laboratory Lab Results: Recent Labs    08/24/17 1654 08/25/17 0420  WBC 7.4 4.8  HGB 8.7* 8.7*  HCT 27.3* 27.4*  PLT 136* 118*   BMET Recent Labs    08/24/17 1654 08/25/17 0420  NA 134* 135  K 4.7 4.0  CL 99* 99*  CO2 22 20*  GLUCOSE 91 81  BUN 38* 40*  CREATININE 8.62* 9.22*  CALCIUM 7.4* 7.6*    COAG Lab Results  Component Value Date   INR 1.53 08/25/2017   INR 1.88 08/24/2017   INR 2.34 08/23/2017   No results found for: PTT  Addendum  I have independently interviewed and examined the patient, and I agree with the physician assistant's findings.  No active bleeding on dressings.    - Start wet-to-dry dressing changes tomorrow - Once arm would cleans up, will transition to [facility]   Adele Barthel, MD, FACS [facility] Office:  [phone] Pager: [phone]  08/25/2017, 8:29 AM

## 2017-08-25 NOTE — Progress Notes (Addendum)
Patient was intubated this AM. Addendum: Critical care team managing medical treatment for today. Patient was later extubated during the day successfully per reports.   No new changes from my end.   ID recommenging 6 wks of IV abx (ancef) with dialysis TEE pending  VSS  Will follow next am  Velvet Bathe, MD

## 2017-08-25 NOTE — Progress Notes (Signed)
Hypoglycemic Event  CBG: 63  Treatment: 4 oz juice  Symptoms: None  Follow-up CBG: Time: 1707 CBG Result: 113 Follow-up CBG drawn via [place]  Possible Reasons for Event: Poor PO intake d/t HD treatment     Ihor Dow

## 2017-08-25 NOTE — Progress Notes (Signed)
Per off-going RN, recently placed left femoral HD cath (4/15) was flushed upon admission with Normal Saline (see IV assessment) and no heparin instilled per report.    Order for heparin 1,000 units/37mL to be instilled in HD catheter. 10cc of blood aspirated from each port of HD catheter.  No clots present upon assessment. Therefore, both ports were flushed with 10 cc NS, and 3cc (3,000 units) of heparin placed in each port, per order and Hep-lock protocol (see port).  Ports taped and heparin sticker applied.   [place] RN as assistant to verify doses and help with hep-lock

## 2017-08-25 NOTE — Procedures (Signed)
Extubation Procedure Note  Patient Details:   Name: Adam [name]. DOB: Aug 21, 1960 MRN: [id number]   Airway Documentation:     Evaluation  O2 sats: stable throughout Complications: No apparent complications Patient did tolerate procedure well. Bilateral Breath Sounds: Clear   Yes   Patient was extubated to a 3L [place] without any complications, dyspnea or stridor noted. Patient was instructed on IS x 5, highest goal achieved was 1634mL.   Sierra Bissonette, Eddie North 08/25/2017, 8:40 AM

## 2017-08-25 NOTE — Progress Notes (Addendum)
PULMONARY / CRITICAL CARE MEDICINE   Name: Adam [name]. MRN: [id number] DOB: 21-Jun-1960    ADMISSION DATE:  08/17/2017 CONSULTATION DATE:  08/24/17  REFERRING MD:  Dr. Velvet Bathe   CHIEF COMPLAINT:  Dyspnea   HISTORY OF PRESENT ILLNESS:   Patient is a 57 y.o male with ESRD on HD (home HD 5 days per week), hypertension, nonischemic cardiomyopathy s/p AICD placement, atrial fibrillation on coumadin, pulmonary HTN who presented to the ED on 4/08 with 1 week of progressive fevers, dyspnea, cough, and chest congestion. He was admitted and found to have MSSA bacteremia. The patient has a TTE on 4/09 without definitive evidence of endocarditis. He underwent AICD lead extraction on 4/11. He was taken for left AV graft removal and ligation with temporary HD catheter placement on 4/15. Graft seemed to be infectious source in the OR. Post-op the patient was unable to be extubated due to hypoxia.  SUBJECTIVE:  Remains intubated, but more awake and cooperative this AM.   VITAL SIGNS: BP (!) 151/76   Pulse 66   Temp 97.6 F (36.4 C) (Oral)   Resp 18   Ht 6\' 1"  (1.854 m)   Wt 129.6 kg (285 lb 11.5 oz)   SpO2 100%   BMI 37.70 kg/m   HEMODYNAMICS:    VENTILATOR SETTINGS: Vent Mode: PRVC FiO2 (%):  [50 %] 50 % Set Rate:  [18 bmp] 18 bmp Vt Set:  [640 mL] 640 mL PEEP:  [5 cmH20-8 cmH20] 8 cmH20 Plateau Pressure:  [24 cmH20-25 cmH20] 24 cmH20  INTAKE / OUTPUT: I/O last 3 completed shifts: In: 1711.9 [I.V.:941.9; Blood:450; NG/GT:20; IV Piggyback:300] Out: 100 [Blood:100]  PHYSICAL EXAMINATION: General:  Obese male, sedated Neuro:  Alert, following commands.  HEENT:  Normocephalic, atraumatic, moist mucus membranes  Cardiovascular:  RRR, systolic murmur noted Lungs:  Scattered rhonchi Abdomen:  Active bowel sounds, soft, non-distended, no tenderness to palpation Musculoskeletal:  Dressing at surgical site is blood tinged but dry.  Skin:  Warm and dry  LABS: BMET Recent  Labs  Lab 08/24/17 0358 08/24/17 1654 08/25/17 0420  NA 134* 134* 135  K 4.0 4.7 4.0  CL 97* 99* 99*  CO2 22 22 20*  BUN 31* 38* 40*  CREATININE 7.94* 8.62* 9.22*  GLUCOSE 76 91 81   Electrolytes Recent Labs  Lab 08/24/17 0358 08/24/17 1654 08/25/17 0420  CALCIUM 7.8* 7.4* 7.6*  MG  --   --  1.8  PHOS 2.8 3.2 3.5   CBC Recent Labs  Lab 08/22/17 0656 08/24/17 1654 08/25/17 0420  WBC 6.2 7.4 4.8  HGB 8.2* 8.7* 8.7*  HCT 25.8* 27.3* 27.4*  PLT 101* 136* 118*   Coag's Recent Labs  Lab 08/23/17 0410 08/24/17 0358 08/25/17 0420  INR 2.34 1.88 1.53   Sepsis Markers No results for input(s): LATICACIDVEN, PROCALCITON, O2SATVEN in the last 168 hours.  ABG Recent Labs  Lab 08/20/17 1655 08/24/17 1652 08/25/17 0425  PHART 7.403 7.421 7.493*  PCO2ART 41.7 33.5 30.6*  PO2ART 144.0* 63.0* 115*   Liver Enzymes Recent Labs  Lab 08/24/17 0358 08/24/17 1654 08/25/17 0420  ALBUMIN 2.9* 3.0* 3.0*   Cardiac Enzymes No results for input(s): TROPONINI, PROBNP in the last 168 hours.  Glucose Recent Labs  Lab 08/24/17 1656 08/24/17 1954 08/24/17 2352  GLUCAP 87 94 81    Imaging Portable Chest X-ray  Result Date: 08/24/2017 CLINICAL DATA:  Endotracheal tube placement EXAM: PORTABLE CHEST 1 VIEW COMPARISON:  08/21/2017 FINDINGS: 1700 hours. Endotracheal  tube tip is 5.1 cm above the base of the carina. The cardio pericardial silhouette is enlarged. Inferiorly placed dialysis catheter tip overlies the right atrium. Central parahilar airspace opacity bilaterally suggests edema. Retrocardiac atelectasis or infiltrate evident. Vascular stent device noted in the left axillary region. Telemetry leads overlie the chest. IMPRESSION: 1. Endotracheal tube tip 5.1 cm above the base of the carina. 2. Enlargement of the cardiopericardial silhouette with probable pulmonary edema. 3. Left base atelectasis/infiltrate with potential tiny left pleural effusion. Electronically Signed    By: Misty Stanley M.D.   On: 08/24/2017 17:26   Dg Abd Portable 1v  Result Date: 08/24/2017 CLINICAL DATA:  57 y/o  M; enteric tube placement. EXAM: PORTABLE ABDOMEN - 1 VIEW COMPARISON:  None. FINDINGS: Normal bowel gas pattern. Vascular calcifications. Enteric tube tip projects over gastric body. Mild levocurvature of lumbar spine. IMPRESSION: Enteric tube tip projects over gastric body. Electronically Signed   By: Kristine Garbe M.D.   On: 08/24/2017 18:18    CULTURES: 4/08: Blood cultures positive with MSSA 4/09: Repeat blood cultures with NGTD 4/15: Surgical cultures pending   ANTIBIOTICS: Azithromycin 4/8 -> stopped 4/9 Cefazolin 4/8 -> current  Ceftriaxone 4/8 -> stopped 4/9  Vancomycin 4/8 -> stopped 4/9   SIGNIFICANT EVENTS: Patient admitted with sepsis on 4/08 Blood cultures positive for MSSA 4/09 TTE without definitive evidence for endocarditis 4/09 AICD lead extraction on 4/11 TEE performed on 4/11, results pending Left AV graft removal and ligation with temporary HD catheter placement on 4/15  LINES/TUBES: Right femoral HD cath 4/15 ET 4/15  ASSESSMENT / PLAN: Patient is a 57 y.o male with ESRD on HD (home HD 5 days per week), hypertension, nonischemic cardiomyopathy s/p AICD placement, atrial fibrillation on coumadin, pulmonary HTN who presented to the ED on 4/08 with MSSA bacteremia secondary to an infected left AV graft. He has subsequently had his ACID leads extracted on 4/11 and went for left AV graft ligation and removal on 4/15.   INFECTIOUS A:   MSSA Bacteremia: (s/p AICD lead removal 4/11 and LUE AV graft removal 4/15)  P:   ID following  TEE pending but will need 6 weeks IV abx regardless  Continue Cefazolin    PULMONARY A: Acute Hypoxic Respiratory Failure  Pulmonary artery hypertension  P:   PS wean this AM Should be extubatable this AM, if not will re-eval after HD. VAP bundle Continue ambrisentan   CARDIOVASCULAR A:  Atrial  Fibrillation on Coumadin Nonischemic cardiomyopathy previous AICD placement removed 4/11 Aortic Stenosis H/O Hypotension  P:  Neo off. MAP goal 57mmHg. Continue amiodarone, midodrine, atorvastatin.  Restart coumadin.  RENAL A:  ESRD on HD 2/2 uncontrolled HTN  P:   Nephrology following For HD today.   GASTROINTESTINAL A: Nutrition  GI ppx    P:   Hold off on tube feeds GI ppx added   HEMATOLOGIC A:  Anemia of chronic disease Thrombocytopenia   P:  Epo per renal  Continue to monitor CBC Warfarin AC per pharmacy  ENDOCRINE A: H/o Hypothyroidism   P:   Continue synthroid   NEUROLOGIC A: Sedation   P:   RASS goal: -1 Currently on precedex for sedation  PRN fentanyl  Wean sedation with RASS goal of -1 WUA this am with hopes of extubation.   Georgann Housekeeper, AGACNP-BC [facility] Pager [phone] or [phone]  08/25/2017 8:11 AM  Attending Note:  57 year old male that was left intubated after removal of an infected  fistula due to inability to wake up.  This AM on exam, patient is much more alert and interactive this AM, weaning well.  I reviewed CXR myself, ETT is in good position.  Discussed with PCCM-NP.  Will proceed with extubation today.  Monitor for airway protection post extubation.  Ambulate.  PT evaluation.  HD today via left groin tunneled cathter.  Will recheck in the afternoon if remains stable will transfer to tele and to [facility] service with PCCM off 4/17.  The patient is critically ill with multiple organ systems failure and requires high complexity decision making for assessment and support, frequent evaluation and titration of therapies, application of advanced monitoring technologies and extensive interpretation of multiple databases.   Critical Care Time devoted to patient care services described in this note is  35  Minutes. This time reflects time of care of this signee Dr Jennet Maduro. This critical care time does not  reflect procedure time, or teaching time or supervisory time of PA/NP/Med student/Med Resident etc but could involve care discussion time.  Rush Farmer, M.D. [facility] Pulmonary/Critical Care Medicine. Pager: [phone]. After hours pager: [phone].

## 2017-08-25 NOTE — Telephone Encounter (Signed)
Dr. Lake Bells please see below email from patient.   From: Adam [name].  Sent: 4/16/20191:30 PM EDT  To: Simonne Maffucci, MD Subject: Non-Urgent Medical Question  Please be advised that [facility] request that you authorize my 2 medications the letaris and tadafil they will notlet me have them even after I explained the necessity

## 2017-08-26 ENCOUNTER — Encounter: Payer: Self-pay | Admitting: Cardiology

## 2017-08-26 LAB — CBC
HCT: 27.3 % — ABNORMAL LOW (ref 39.0–52.0)
Hemoglobin: 8.4 g/dL — ABNORMAL LOW (ref 13.0–17.0)
MCH: 27.9 pg (ref 26.0–34.0)
MCHC: 30.8 g/dL (ref 30.0–36.0)
MCV: 90.7 fL (ref 78.0–100.0)
PLATELETS: 142 10*3/uL — AB (ref 150–400)
RBC: 3.01 MIL/uL — AB (ref 4.22–5.81)
RDW: 19.2 % — ABNORMAL HIGH (ref 11.5–15.5)
WBC: 5.8 10*3/uL (ref 4.0–10.5)

## 2017-08-26 LAB — BASIC METABOLIC PANEL
Anion gap: 13 (ref 5–15)
BUN: 29 mg/dL — AB (ref 6–20)
CO2: 24 mmol/L (ref 22–32)
Calcium: 7.5 mg/dL — ABNORMAL LOW (ref 8.9–10.3)
Chloride: 98 mmol/L — ABNORMAL LOW (ref 101–111)
Creatinine, Ser: 7.48 mg/dL — ABNORMAL HIGH (ref 0.61–1.24)
GFR calc Af Amer: 8 mL/min — ABNORMAL LOW (ref >60)
GFR calc non Af Amer: 7 mL/min — ABNORMAL LOW (ref >60)
Glucose, Bld: 80 mg/dL (ref 65–99)
POTASSIUM: 4.1 mmol/L (ref 3.5–5.1)
SODIUM: 135 mmol/L (ref 135–145)

## 2017-08-26 LAB — HEPATIC FUNCTION PANEL
ALBUMIN: 2.8 g/dL — AB (ref 3.5–5.0)
ALT: 6 U/L — ABNORMAL LOW (ref 17–63)
AST: 14 U/L — ABNORMAL LOW (ref 15–41)
Alkaline Phosphatase: 69 U/L (ref 38–126)
BILIRUBIN DIRECT: 0.2 mg/dL (ref 0.1–0.5)
BILIRUBIN TOTAL: 0.6 mg/dL (ref 0.3–1.2)
Indirect Bilirubin: 0.4 mg/dL (ref 0.3–0.9)
Total Protein: 6.4 g/dL — ABNORMAL LOW (ref 6.5–8.1)

## 2017-08-26 LAB — PHOSPHORUS: Phosphorus: 3.8 mg/dL (ref 2.5–4.6)

## 2017-08-26 LAB — PROTIME-INR
INR: 1.57
PROTHROMBIN TIME: 18.6 s — AB (ref 11.4–15.2)

## 2017-08-26 LAB — MAGNESIUM: Magnesium: 1.8 mg/dL (ref 1.7–2.4)

## 2017-08-26 MED ORDER — SODIUM CHLORIDE 0.9 % IV BOLUS: 250.0000 mL | Freq: Once | INTRAVENOUS | Status: DC

## 2017-08-26 MED ORDER — WARFARIN SODIUM 3 MG PO TABS
6.0000 mg | ORAL_TABLET | Freq: Once | ORAL | Status: AC
Start: 2017-08-26 — End: 2017-08-26
  Administered 2017-08-26: 6 mg via ORAL
  Filled 2017-08-26: qty 2

## 2017-08-26 NOTE — Progress Notes (Signed)
Dr. Vaughan Browner paged again for pt continued low BP of 70's. Pt still asymptomatic. Verbal order for another 250 cc bolus received. Pt refused second bolus. Provider made aware

## 2017-08-26 NOTE — Progress Notes (Addendum)
[facility] Progress Note   Subjective:   C/os pain from staples in L arm.    Objective Vitals:   08/26/17 0405 08/26/17 0444 08/26/17 0700 08/26/17 0800  BP: 99/72  (!) 81/32   Pulse: 83  76 80  Resp: (!) 22  (!) 21 20  Temp: 98.6 F (37 C)  97.6 F (36.4 C)   TempSrc: Oral  Oral   SpO2: 95%  99% 98%  Weight:  126.9 kg (279 lb 12.2 oz)    Height:       Physical Exam General:NAD, chronically ill appearing male Heart:RRR, +3/9 systolic murmur Lungs:mostly CTAB, breath sounds diminished b/l, nml WOB Abdomen:soft, NTND Extremities:no LE edema Dialysis Access: R thigh TDC,    Filed Weights   08/25/17 1730 08/25/17 2045 08/26/17 0444  Weight: 125 kg (275 lb 9.2 oz) 127 kg (279 lb 15.8 oz) 126.9 kg (279 lb 12.2 oz)    Intake/Output Summary (Last 24 hours) at 08/26/2017 1037 Last data filed at 08/25/2017 1835 Gross per 24 hour  Intake 320 ml  Output 2501 ml  Net -2181 ml    Additional Objective Labs: Basic Metabolic Panel: Recent Labs  Lab 08/24/17 1654 08/25/17 0420 08/26/17 0155  NA 134* 135 135  K 4.7 4.0 4.1  CL 99* 99* 98*  CO2 22 20* 24  GLUCOSE 91 81 80  BUN 38* 40* 29*  CREATININE 8.62* 9.22* 7.48*  CALCIUM 7.4* 7.6* 7.5*  PHOS 3.2 3.5 3.8   Liver Function Tests: Recent Labs  Lab 08/24/17 1654 08/25/17 0420 08/26/17 0155  AST  --   --  14*  ALT  --   --  6*  ALKPHOS  --   --  69  BILITOT  --   --  0.6  PROT  --   --  6.4*  ALBUMIN 3.0* 3.0* 2.8*   CBC: Recent Labs  Lab 08/21/17 0432 08/22/17 0656 08/24/17 1654 08/25/17 0420 08/26/17 0155  WBC 4.7 6.2 7.4 4.8 5.8  HGB 8.1* 8.2* 8.7* 8.7* 8.4*  HCT 25.2* 25.8* 27.3* 27.4* 27.3*  MCV 91.0 89.3 89.2 88.4 90.7  PLT 78* 101* 136* 118* 142*   Blood Culture    Component Value Date/Time   SDES WOUND UPPER ARM PROXIMAL FISTULA 08/24/2017 1412   SPECREQUEST SPEC B ON SWABS 08/24/2017 1412   CULT  08/24/2017 1412    FEW STAPHYLOCOCCUS AUREUS SUSCEPTIBILITIES TO  FOLLOW Performed at [facility], [address], [place],  [address]    REPTSTATUS PENDING 08/24/2017 1412   CBG: Recent Labs  Lab 08/25/17 1330 08/25/17 1601 08/25/17 1647 08/25/17 1707 08/25/17 1946  GLUCAP 102* 63* 66 113* 94   Lab Results  Component Value Date   INR 1.57 08/26/2017   INR 1.53 08/25/2017   INR 1.88 08/24/2017   Studies/Results: Ct Angio Up Extrem Left W &/or Wo Contast  Result Date: 08/26/2017 CLINICAL DATA:  57 year old male with a left upper extremity brachiocephalic arteriovenous fistula and left upper extremity pain. EXAM: CT ANGIOGRAPHY OF THE left upperEXTREMITY TECHNIQUE: Multidetector CT imaging of the left upper extremitywas performed using the standard protocol during bolus administration of intravenous contrast. Multiplanar CT image reconstructions and MIPs were obtained to evaluate the vascular anatomy. CONTRAST:  173mL ISOVUE-370 IOPAMIDOL (ISOVUE-370) INJECTION 76% COMPARISON:  Prior radiographs of the left upper extremity 08/18/2017 FINDINGS: Unfortunately, there is no contrast opacification of the vascular structures rendering this CT arteriogram of the left upper extremity extremely limited. The patient  was going to return for repeat imaging but had already gone to surgery at that point. Therefore, repeat imaging was deferred. Left upper extremity brachiocephalic arteriovenous fistula. There is a discontinuous and fractured stent in the proximal cephalic vein just above the elbow. Irregular soft tissue density is present surrounding the fractured stent and extending into the surrounding soft tissues and to the overlying skin. There is associated overlying skin thickening. The fistula wall appears thickened and calcified in the mid arm. Two overlapping stents are present beginning in the axillary vein and extending into the subclavian vein. These stent systems are incompletely imaged. Again, there is evidence of fracture. The left brachial  artery demonstrates mild peripheral calcifications. No suspicious adenopathy or soft tissue lesion. Mild degenerative osteoarthritis at the left glenohumeral joint. No acute osseous abnormality. Review of the MIP images confirms the above findings. IMPRESSION: 1. Unfortunately, no contrast opacifies the vessels of the left upper extremity rendering this CT arteriogram extremely limited. 2. There is a frankly fractured stent in the proximal draining cephalic vein of the left upper extremity brachiocephalic fistula just above the elbow. Soft tissue density surrounding the site of stent fracture may represent pseudoaneurysm and/or abscess. There is associated inflammatory stranding in the surrounding soft tissues and thickening of the overlying skin. 3. Overlapping stents in the left axillary and left subclavian veins are incompletely imaged. There is evidence of stent fracture. 4. Atherosclerotic calcifications in the brachial artery. Electronically Signed   By: Jacqulynn Cadet M.D.   On: 08/26/2017 08:30   Dg Chest Port 1 View  Result Date: 08/25/2017 CLINICAL DATA:  ET tube EXAM: PORTABLE CHEST 1 VIEW COMPARISON:  08/24/2017 FINDINGS: Endotracheal tube is unchanged. Interval placement of NG tube into the stomach. Cardiomegaly. Increasing bilateral perihilar and lower lobe opacities, likely edema. Suspect small layering effusions. IMPRESSION: Worsening bilateral perihilar and lower lobe opacities, likely edema/CHF. Layering effusions. Electronically Signed   By: Rolm Baptise M.D.   On: 08/25/2017 07:49   Portable Chest X-ray  Result Date: 08/24/2017 CLINICAL DATA:  Endotracheal tube placement EXAM: PORTABLE CHEST 1 VIEW COMPARISON:  08/21/2017 FINDINGS: 1700 hours. Endotracheal tube tip is 5.1 cm above the base of the carina. The cardio pericardial silhouette is enlarged. Inferiorly placed dialysis catheter tip overlies the right atrium. Central parahilar airspace opacity bilaterally suggests edema.  Retrocardiac atelectasis or infiltrate evident. Vascular stent device noted in the left axillary region. Telemetry leads overlie the chest. IMPRESSION: 1. Endotracheal tube tip 5.1 cm above the base of the carina. 2. Enlargement of the cardiopericardial silhouette with probable pulmonary edema. 3. Left base atelectasis/infiltrate with potential tiny left pleural effusion. Electronically Signed   By: Misty Stanley M.D.   On: 08/24/2017 17:26   Dg Abd Portable 1v  Result Date: 08/24/2017 CLINICAL DATA:  57 y/o  M; enteric tube placement. EXAM: PORTABLE ABDOMEN - 1 VIEW COMPARISON:  None. FINDINGS: Normal bowel gas pattern. Vascular calcifications. Enteric tube tip projects over gastric body. Mild levocurvature of lumbar spine. IMPRESSION: Enteric tube tip projects over gastric body. Electronically Signed   By: Kristine Garbe M.D.   On: 08/24/2017 18:18    Medications: .  ceFAZolin (ANCEF) IV Stopped (08/25/17 1855)   . ambrisentan  10 mg Oral Daily  . amiodarone  200 mg Oral QPM  . atorvastatin  40 mg Oral QPM  . calcitRIOL  0.25 mcg Oral Daily  . calcium acetate  1,334 mg Oral TID WC  . cinacalcet  90 mg Oral QPM  .  darbepoetin (ARANESP) injection - DIALYSIS  150 mcg Intravenous Q Sat-HD  . levothyroxine  300 mcg Oral QAC breakfast  . mouth rinse  15 mL Mouth Rinse BID  . midodrine  10 mg Oral 3 times per day  . mupirocin ointment  1 application Nasal BID  . Warfarin - Pharmacist Dosing Inpatient   Does not apply q1800    Dialysis Orders: Home Hemo - 3hrs, 5 day/wk, EDW ~125kg, no heparin (on coumadin at home)  Brief Summary: 57 year old BM with multiple medical problems including ESRD- Presenting with fever and shortness of breath with elevated white count and low blood pressure   Assessment/Plan: 1. MSSA bacteremia -  ruptured AVF w/pseudoaneurysm and proximal pus pocket w/necrotic subQ tissue s/p excision of distal half of L BC AVF & stent and I&D abscess by Dr. Bridgett Larsson on  4/16.s/p ICD removal on 4/12 d/t suspected source of infection. [facility] 4/9 NGTD. Per ID to received 6 weeks of ancef w/HD.   2. ESRD - on Home HD 3hrs 5d/wk. HD yesterday tolerated well, net UF removed 2.5L.  Plan to continue per regular [facility] schedule. Orders written.  Will call home therapies to develop a plan for ABX once d/c.  3. Anemia of CKD- Hgb 8.4, cont aranesp qwk. 4. Secondary hyperparathyroidism - Ca and P in goal. Continue binders, VDRA and sensipar.  5. HTN/volume - Chronic hypotension, on midodrine. Avoid fluid bolus for low BP.  Likely has loss weight, may need lower EDW with d/c. Plan for continued volume removal with daily HD.  6. Nutrition - Alb 2.8. Renal diet w/Fluid restrictions. Nepro. Renavite. 7. AFib - back on coumadin, INR 1.5 today 8.  Pulm HTN - on Letairis and Tadalafil, f/b pulm clinic    Jen Mow, PA-C [facility] Pager: [phone] 08/26/2017,10:37 AM  LOS: 9 days   Pt seen, examined, agree w assess/plan as above with additions as indicated.  Kelly Splinter MD [employer] pager [phone]    cell [phone] 08/26/2017, 12:25 PM

## 2017-08-26 NOTE — Care Management Note (Signed)
Case Management Note [name] RN, BSN Unit 4E-Case Manager [phone]  Patient Details  Name: Adam [name]. MRN: [id number] Date of Birth: [birth year]  Subjective/Objective:  Pt admitted with Fever, positive for MSSA with sepsis, ICD infection s/p removal-                  Action/Plan: PTA pt lived at home with wife- hx of ESRD with home HD m-f- per ID notes pt will need 6 wks of IV abx- pt will need orders for IV abx and set up with [facility] prior to discharge to assist with home IV abx needs- CM to follow, Life Vest has already been arranged and is at the bedside for discharge.   Expected Discharge Date:  (unknown)               Expected Discharge Plan:  [place]  In-House Referral:  NA  Discharge planning Services  CM Consult  Post Acute Care Choice:  Home Health, Durable Medical Equipment Choice offered to:  Patient, Spouse  DME Arranged:  Life vest DME Agency:  [facility]  HH Arranged:  IV Antibiotics HH Agency:     Status of Service:  In process, will continue to follow  If discussed at Long Length of Stay Meetings, dates discussed:    Discharge Disposition:   Additional Comments: 08/26/2017 Pt now in [place].  Pt will have wound vac at discharge - per surgery pt can use [facility] for wound vac.  Referral given to agency - form placed on shadow chart and needs to be completed.  CM informed surgery PA that form is on chart.  [facility] already following pt for IV infusion (antibiotic and HHRN). Maryclare Labrador, RN 08/26/2017, 2:01 PM

## 2017-08-26 NOTE — Progress Notes (Signed)
ANTICOAGULATION CONSULT NOTE - Follow Up Consult  Pharmacy Consult for warfarin (Afib)   Allergies  Allergen Reactions  . Enalaprilat Swelling    Mouth swelling  . Vasotec [Enalapril] Swelling    MOUTH SWELLING  . Ivp Dye [Iodinated Diagnostic Agents] Nausea And Vomiting    Patient Measurements: Height: 6\' 1"  (185.4 cm) Weight: 279 lb 12.2 oz (126.9 kg) IBW/kg (Calculated) : 79.9 Heparin Dosing Weight: 106.7 kg  Vital Signs: Temp: 97.6 F (36.4 C) (04/17 0700) Temp Source: Oral (04/17 0700) BP: 81/32 (04/17 0700) Pulse Rate: 80 (04/17 0800)  Labs: Recent Labs    08/24/17 0358  08/24/17 1654 08/25/17 0420 08/26/17 0155  HGB  --    < > 8.7* 8.7* 8.4*  HCT  --   --  27.3* 27.4* 27.3*  PLT  --   --  136* 118* 142*  LABPROT 21.4*  --   --  18.2* 18.6*  INR 1.88  --   --  1.53 1.57  CREATININE 7.94*  --  8.62* 9.22* 7.48*   < > = values in this interval not displayed.    Estimated Creatinine Clearance: 15.8 mL/min (A) (by C-G formula based on SCr of 7.48 mg/dL (H)).   Medications:  Scheduled:  . ambrisentan  10 mg Oral Daily  . amiodarone  200 mg Oral QPM  . atorvastatin  40 mg Oral QPM  . calcitRIOL  0.25 mcg Oral Daily  . calcium acetate  1,334 mg Oral TID WC  . cinacalcet  90 mg Oral QPM  . darbepoetin (ARANESP) injection - DIALYSIS  150 mcg Intravenous Q Sat-HD  . levothyroxine  300 mcg Oral QAC breakfast  . mouth rinse  15 mL Mouth Rinse BID  . midodrine  10 mg Oral 3 times per day  . mupirocin ointment  1 application Nasal BID  . Warfarin - Pharmacist Dosing Inpatient   Does not apply q1800   Infusions:  .  ceFAZolin (ANCEF) IV Stopped (08/25/17 1855)    Assessment/Plan Patient is on warfarin PTA for Afib, INR up to 4.93 this admission and given vitamin K 5 mg on 4/10. Infected ICD was removed 4/12 and also s/p &D of fistula on 4/15 and placement of TDC.  Warfarin has been restarted -INR= 1.57  Goal of Therapy:  INR 2-3 Monitor platelets by  anticoagulation protocol: Yes   Plan: Warfarin 6mg  tonight Continue daily INR  Hildred Laser, PharmD Clinical Pharmacist Clinical phone from 8:30-4:00 is [phone] After 4pm, please call Main Rx [phone]) for assistance. 08/26/2017 11:16 AM

## 2017-08-26 NOTE — Progress Notes (Addendum)
PROGRESS NOTE    Adam Alm.   JGG:[id number]  DOB: 24-Feb-1961  DOA: 08/17/2017 PCP: [name], MD   Brief Narrative:  Adam Alm. is a 57 y/o with ESRD on home hemodialysis on Monday through Friday for the past year, atrial fibrillation on Coumadin, chronic hypertension, nonischemic cardiomyopathy with AICD, pulmonary hypertension, moderate aortic stenosis He has been feeling well up until one day prior to admission when he developed a fever (102.8) and dyspnea.  Apparently his wife stated that he had felt warm for a few days and may have been having fevers and not realizing it.  He has been doing his dialysis and his fistula has felt fine.  He had a dry cough for 1 week. He was admitted at [facility] in transfer to [facility] for ongoing dialysis.  Blood cultures became positive for MSSA.  Pt subsequently had defibrillator and fistula removed. Currently on antibiotics and perioperative period (fistula removed 4/15)  Subjective: Pt would like IJ removed.  Assessment & Plan:   Principal Problem:   MSSA bacteremia with sepsis (fevers leukocytosis hypotension) - In setting of AICD - Appreciate management by ID- Currently on Ancef and Gentamycin - Pt is s/p removal of defibillator - Vascular surgery has removed fistula as patient complaining of increased pain and on their evaluation there was suspicion of stent fx. Pt take to OR and found to have puss and necrotic tissue as such fistula/graft removed on 4/15.  Active Problems:    Atrial fibrillation-Elevated INR - Continue patient on Amiodarone - Pharmacy managing coumadin  Acute on chronic thrombocytopenia - It appears that platelets have been between 101- 150 in the past - INR is elevated and not on any other anticoagulation - Continue bleeding precautions   ESRD/acute on chronic hypotension - On dialysis Monday through Friday and midodrine - Being managed by nephrology - See fistula discussion  above.   Severe pulmonary hypertension Tadalafil is being replaced with Cialis-we will need to hold this due to hypotension -Resume ambrisentan today  Hyperlipidemia -Continue statin  Hypothyroidism Synthroid  DVT prophylaxis: INR elevated-Coumadin Code Status: Full code Family Communication: discussed with patient and family member at bedside.  Disposition Plan: Follow-up on MSSA bacteremia and hypotension- Consultants:   ID  Nephrology  Procedures:   ICD (removed lead 4-11) HD fistula/graft (removed 4-15), pus and necrotic tissue VDRF (extubated 4-16)  Antimicrobials:  Anti-infectives (From admission, onward)   Start     Dose/Rate Route Frequency Ordered Stop   08/24/17 1230  ceFAZolin (ANCEF) 3,000 mg in dextrose 5 % 100 mL IVPB     3,000 mg 260 mL/hr over 30 Minutes Intravenous To Surgery 08/24/17 1151 08/24/17 1404   08/24/17 1148  ceFAZolin (ANCEF) 2-4 GM/100ML-% IVPB    Note to Pharmacy:  Debbe Bales, Meredit: cabinet override      08/24/17 1148 08/24/17 1720   08/20/17 1300  gentamicin (GARAMYCIN) 80 mg in sodium chloride 0.9 % 500 mL irrigation     80 mg Irrigation To ShortStay Surgical 08/19/17 1351 08/20/17 1511   08/20/17 1200  ceFAZolin (ANCEF) 3 g in dextrose 5 % 50 mL IVPB  Status:  Discontinued     3 g 130 mL/hr over 30 Minutes Intravenous To ShortStay Surgical 08/19/17 1351 08/20/17 1954   08/18/17 1800  ceFAZolin (ANCEF) IVPB 1 g/50 mL premix     1 g 100 mL/hr over 30 Minutes Intravenous Every 24 hours 08/18/17 0035     08/18/17 1000  cefTRIAXone (ROCEPHIN) 1 g in sodium chloride 0.9 % 100 mL IVPB  Status:  Discontinued     1 g 200 mL/hr over 30 Minutes Intravenous Every 24 hours 08/17/17 1151 08/18/17 0035   08/18/17 0100  ceFAZolin (ANCEF) IVPB 2g/100 mL premix     2 g 200 mL/hr over 30 Minutes Intravenous  Once 08/18/17 0035 08/18/17 0127   08/17/17 1600  azithromycin (ZITHROMAX) tablet 500 mg  Status:  Discontinued     500 mg Oral Daily  08/17/17 1505 08/18/17 1053   08/17/17 1200  vancomycin (VANCOCIN) 2,000 mg in sodium chloride 0.9 % 500 mL IVPB     2,000 mg 250 mL/hr over 120 Minutes Intravenous  Once 08/17/17 1144 08/17/17 1526   08/17/17 1115  cefTRIAXone (ROCEPHIN) 1 g in sodium chloride 0.9 % 100 mL IVPB     1 g 200 mL/hr over 30 Minutes Intravenous  Once 08/17/17 1111 08/17/17 1313   08/17/17 1115  azithromycin (ZITHROMAX) tablet 500 mg     500 mg Oral  Once 08/17/17 1111 08/17/17 1139       Objective: Vitals:   08/26/17 0405 08/26/17 0444 08/26/17 0700 08/26/17 0800  BP: 99/72  (!) 81/32   Pulse: 83  76 80  Resp: (!) 22  (!) 21 20  Temp: 98.6 F (37 C)  97.6 F (36.4 C)   TempSrc: Oral  Oral   SpO2: 95%  99% 98%  Weight:  126.9 kg (279 lb 12.2 oz)    Height:        Intake/Output Summary (Last 24 hours) at 08/26/2017 1051 Last data filed at 08/25/2017 1835 Gross per 24 hour  Intake 320 ml  Output 2501 ml  Net -2181 ml   Filed Weights   08/25/17 1730 08/25/17 2045 08/26/17 0444  Weight: 125 kg (275 lb 9.2 oz) 127 kg (279 lb 15.8 oz) 126.9 kg (279 lb 12.2 oz)    Examination: General exam: Pt in nad, Appears comfortable HEENT: PERRLA, oral mucosa moist, no sclera icterus or thrush Respiratory system: Clear to auscultation.  Has a dry cough respiratory effort normal. Cardiovascular system: S1 & S2 heard, RRR.   Gastrointestinal system: Abdomen soft, non-tender, nondistended. Normal bowel sound. No organomegaly Central nervous system: Alert and oriented. No focal neurological deficits. Extremities: No cyanosis, clubbing or edema Skin: No rashes or ulcers, pt has gauze over left arm where fistula was removed. Psychiatry:  Mood & affect appropriate.   Data Reviewed: I have personally reviewed following labs and imaging studies  CBC: Recent Labs  Lab 08/21/17 0432 08/22/17 0656 08/24/17 1654 08/25/17 0420 08/26/17 0155  WBC 4.7 6.2 7.4 4.8 5.8  HGB 8.1* 8.2* 8.7* 8.7* 8.4*  HCT 25.2*  25.8* 27.3* 27.4* 27.3*  MCV 91.0 89.3 89.2 88.4 90.7  PLT 78* 101* 136* 118* 240*   Basic Metabolic Panel: Recent Labs  Lab 08/23/17 0410 08/24/17 0358 08/24/17 1654 08/25/17 0420 08/26/17 0155  NA 136 134* 134* 135 135  K 4.0 4.0 4.7 4.0 4.1  CL 98* 97* 99* 99* 98*  CO2 27 22 22  20* 24  GLUCOSE 84 76 91 81 80  BUN 25* 31* 38* 40* 29*  CREATININE 6.59* 7.94* 8.62* 9.22* 7.48*  CALCIUM 7.7* 7.8* 7.4* 7.6* 7.5*  MG  --   --   --  1.8 1.8  PHOS 2.5 2.8 3.2 3.5 3.8   GFR: Estimated Creatinine Clearance: 15.8 mL/min (A) (by C-G formula based on SCr of 7.48 mg/dL (H)). Liver  Function Tests: Recent Labs  Lab 08/23/17 0410 08/24/17 0358 08/24/17 1654 08/25/17 0420 08/26/17 0155  AST  --   --   --   --  14*  ALT  --   --   --   --  6*  ALKPHOS  --   --   --   --  69  BILITOT  --   --   --   --  0.6  PROT  --   --   --   --  6.4*  ALBUMIN 2.7* 2.9* 3.0* 3.0* 2.8*   No results for input(s): LIPASE, AMYLASE in the last 168 hours. No results for input(s): AMMONIA in the last 168 hours. Coagulation Profile: Recent Labs  Lab 08/22/17 0257 08/23/17 0410 08/24/17 0358 08/25/17 0420 08/26/17 0155  INR 2.43 2.34 1.88 1.53 1.57   Cardiac Enzymes: No results for input(s): CKTOTAL, CKMB, CKMBINDEX, TROPONINI in the last 168 hours. BNP (last 3 results) No results for input(s): PROBNP in the last 8760 hours. HbA1C: No results for input(s): HGBA1C in the last 72 hours. CBG: Recent Labs  Lab 08/25/17 1330 08/25/17 1601 08/25/17 1647 08/25/17 1707 08/25/17 1946  GLUCAP 102* 63* 66 113* 94   Lipid Profile: No results for input(s): CHOL, HDL, LDLCALC, TRIG, CHOLHDL, LDLDIRECT in the last 72 hours. Thyroid Function Tests: No results for input(s): TSH, T4TOTAL, FREET4, T3FREE, THYROIDAB in the last 72 hours. Anemia Panel: No results for input(s): VITAMINB12, FOLATE, FERRITIN, TIBC, IRON, RETICCTPCT in the last 72 hours. Urine analysis: No results found for: COLORURINE,  APPEARANCEUR, LABSPEC, PHURINE, GLUCOSEU, HGBUR, BILIRUBINUR, KETONESUR, PROTEINUR, UROBILINOGEN, NITRITE, LEUKOCYTESUR Sepsis Labs: @LABRCNTIP (procalcitonin:4,lacticidven:4) ) Recent Results (from the past 240 hour(s))  Blood culture (routine x 2)     Status: Abnormal   Collection Time: 08/17/17  9:22 AM  Result Value Ref Range Status   Specimen Description   Final    BLOOD RIGHT FOREARM Performed at [facility] [address] [place], [place] [address]    Special Requests   Final    BOTTLES DRAWN AEROBIC AND ANAEROBIC Blood Culture adequate volume Performed at [facility] [address], [place], [place] [address]    Culture  Setup Time   Final    GRAM POSITIVE COCCI IN BOTH AEROBIC AND ANAEROBIC BOTTLES Organism ID to follow CRITICAL RESULT CALLED TO, READ BACK BY AND VERIFIED WITH: A MASTERS PHARMD 08/18/17 0009 JDW Performed at [facility], [place] [address], [place], [place] [address]    Culture STAPHYLOCOCCUS AUREUS (A)  Final   Report Status 08/20/2017 FINAL  Final   Organism ID, Bacteria STAPHYLOCOCCUS AUREUS  Final      Susceptibility   Staphylococcus aureus - MIC*    CIPROFLOXACIN <=0.5 SENSITIVE Sensitive     ERYTHROMYCIN >=8 RESISTANT Resistant     GENTAMICIN <=0.5 SENSITIVE Sensitive     OXACILLIN 0.5 SENSITIVE Sensitive     TETRACYCLINE <=1 SENSITIVE Sensitive     VANCOMYCIN <=0.5 SENSITIVE Sensitive     TRIMETH/SULFA <=10 SENSITIVE Sensitive     CLINDAMYCIN <=0.25 SENSITIVE Sensitive     RIFAMPIN <=0.5 SENSITIVE Sensitive     Inducible Clindamycin NEGATIVE Sensitive     * STAPHYLOCOCCUS AUREUS  Blood Culture ID Panel (Reflexed)     Status: Abnormal   Collection Time: 08/17/17  9:22 AM  Result Value Ref Range Status   Enterococcus species NOT DETECTED NOT DETECTED Final   Listeria monocytogenes NOT DETECTED NOT DETECTED Final  Staphylococcus species DETECTED (A) NOT DETECTED Final    Comment: CRITICAL RESULT  CALLED TO, READ BACK BY AND VERIFIED WITH: A MASTERS PHARMD 08/18/17 0009 JDW    Staphylococcus aureus DETECTED (A) NOT DETECTED Final    Comment: Methicillin (oxacillin) susceptible Staphylococcus aureus (MSSA). Preferred therapy is anti staphylococcal beta lactam antibiotic (Cefazolin or Nafcillin), unless clinically contraindicated. CRITICAL RESULT CALLED TO, READ BACK BY AND VERIFIED WITH: A MASTERS PHARMD 08/18/17 0009 JDW    Methicillin resistance NOT DETECTED NOT DETECTED Final   Streptococcus species NOT DETECTED NOT DETECTED Final   Streptococcus agalactiae NOT DETECTED NOT DETECTED Final   Streptococcus pneumoniae NOT DETECTED NOT DETECTED Final   Streptococcus pyogenes NOT DETECTED NOT DETECTED Final   Acinetobacter baumannii NOT DETECTED NOT DETECTED Final   Enterobacteriaceae species NOT DETECTED NOT DETECTED Final   Enterobacter cloacae complex NOT DETECTED NOT DETECTED Final   Escherichia coli NOT DETECTED NOT DETECTED Final   Klebsiella oxytoca NOT DETECTED NOT DETECTED Final   Klebsiella pneumoniae NOT DETECTED NOT DETECTED Final   Proteus species NOT DETECTED NOT DETECTED Final   Serratia marcescens NOT DETECTED NOT DETECTED Final   Haemophilus influenzae NOT DETECTED NOT DETECTED Final   Neisseria meningitidis NOT DETECTED NOT DETECTED Final   Pseudomonas aeruginosa NOT DETECTED NOT DETECTED Final   Candida albicans NOT DETECTED NOT DETECTED Final   Candida glabrata NOT DETECTED NOT DETECTED Final   Candida krusei NOT DETECTED NOT DETECTED Final   Candida parapsilosis NOT DETECTED NOT DETECTED Final   Candida tropicalis NOT DETECTED NOT DETECTED Final    Comment: Performed at [facility], [place]. [address] [place], [place] [address]  Blood culture (routine x 2)     Status: Abnormal   Collection Time: 08/17/17  9:52 AM  Result Value Ref Range Status   Specimen Description   Final    BLOOD RIGHT FOREARM Performed at [facility] [address], [place], [place] [address]    Special Requests   Final    BOTTLES DRAWN AEROBIC AND ANAEROBIC Blood Culture adequate volume Performed at [facility] [address] [place], [place] [address]    Culture  Setup Time   Final    GRAM POSITIVE COCCI IN BOTH AEROBIC AND ANAEROBIC BOTTLES CRITICAL RESULT CALLED TO, READ BACK BY AND VERIFIED WITH: A MASTERS PHARMD 08/18/17 00009 JDW    Culture (A)  Final    STAPHYLOCOCCUS AUREUS SUSCEPTIBILITIES PERFORMED ON PREVIOUS CULTURE WITHIN THE LAST 5 DAYS. Performed at [facility], [place] [address] [place], [place] [address]    Report Status 08/20/2017 FINAL  Final  MRSA PCR Screening     Status: None   Collection Time: 08/17/17  8:40 PM  Result Value Ref Range Status   MRSA by PCR NEGATIVE NEGATIVE Final    Comment:        The GeneXpert MRSA Assay (FDA approved for NASAL specimens only), is one component of a comprehensive MRSA colonization surveillance program. It is not intended to diagnose MRSA infection nor to guide or monitor treatment for MRSA infections. Performed at [facility], [place] [address], [place], [place] [address]   Culture, blood (Routine X 2) w Reflex to ID Panel     Status: None   Collection Time: 08/18/17  3:05 AM  Result Value Ref Range Status   Specimen Description BLOOD RIGHT HAND  Final   Special Requests   Final    BOTTLES DRAWN  AEROBIC ONLY Blood Culture adequate volume   Culture   Final    NO GROWTH 5 DAYS Performed at [facility], [place] [address] [place], [place] [address]    Report Status 08/23/2017 FINAL  Final  Culture, blood (Routine X 2) w Reflex to ID Panel     Status: None   Collection Time: 08/18/17  3:05 AM  Result Value Ref Range Status   Specimen Description BLOOD RIGHT HAND  Final   Special Requests   Final    BOTTLES DRAWN AEROBIC ONLY Blood Culture adequate volume   Culture   Final    NO GROWTH 5 DAYS Performed at [facility], [place] [address], [place], [place] [address]    Report Status 08/23/2017 FINAL  Final  Culture, sputum-assessment     Status: None   Collection Time: 08/18/17  9:32 PM  Result Value Ref Range Status   Specimen Description EXPECTORATED SPUTUM  Final   Special Requests NONE  Final   Sputum evaluation   Final    RESULT CALLED TO, READ BACK BY AND VERIFIED WITH: Miguel Dibble RN 0630 08/18/17 A BROWNING Performed at [facility], [place] [address], [place], [place] [address]    Report Status 08/18/2017 FINAL  Final  Surgical PCR screen     Status: Abnormal   Collection Time: 08/20/17 10:11 AM  Result Value Ref Range Status   MRSA, PCR NEGATIVE NEGATIVE Final   Staphylococcus aureus POSITIVE (A) NEGATIVE Final    Comment: (NOTE) The Xpert SA Assay (FDA approved for NASAL specimens in patients 64 years of age and older), is one component of a comprehensive surveillance program. It is not intended to diagnose infection nor to guide or monitor treatment. Performed at [facility], [place] [address], [place], [place] [address]   Aerobic/Anaerobic Culture (surgical/deep wound)     Status: None (Preliminary result)   Collection Time: 08/24/17  2:06 PM  Result Value Ref Range Status   Specimen Description WOUND  Final   Special Requests RUTURED AV FISTULA  Final   Gram Stain NO WBC SEEN NO ORGANISMS SEEN   Final   Culture   Final    CULTURE REINCUBATED FOR BETTER GROWTH Performed at [facility], [place] [address] [place], [place] [address]    Report Status PENDING  Incomplete  Aerobic/Anaerobic Culture (surgical/deep wound)     Status: None (Preliminary result)   Collection Time: 08/24/17  2:12 PM  Result Value Ref Range Status   Specimen Description WOUND UPPER ARM PROXIMAL FISTULA  Final   Special Requests SPEC B ON SWABS  Final   Gram Stain   Final    FEW WBC PRESENT, PREDOMINANTLY PMN NO ORGANISMS SEEN    Culture   Final    FEW STAPHYLOCOCCUS AUREUS SUSCEPTIBILITIES TO FOLLOW Performed  at [facility], [place] [address] [place], [place] [address]    Report Status PENDING  Incomplete         Radiology Studies: Ct Angio Up Extrem Left W &/or Wo Contast  Result Date: 08/26/2017 CLINICAL DATA:  57 year old male with a left upper extremity brachiocephalic arteriovenous fistula and left upper extremity pain. EXAM: CT ANGIOGRAPHY OF THE left upperEXTREMITY TECHNIQUE: Multidetector CT imaging of the left upper extremitywas performed using the standard protocol during bolus administration of intravenous contrast. Multiplanar CT image reconstructions and MIPs were obtained to evaluate the vascular anatomy. CONTRAST:  114mL ISOVUE-370 IOPAMIDOL (ISOVUE-370) INJECTION 76%  COMPARISON:  Prior radiographs of the left upper extremity 08/18/2017 FINDINGS: Unfortunately, there is no contrast opacification of the vascular structures rendering this CT arteriogram of the left upper extremity extremely limited. The patient was going to return for repeat imaging but had already gone to surgery at that point. Therefore, repeat imaging was deferred. Left upper extremity brachiocephalic arteriovenous fistula. There is a discontinuous and fractured stent in the proximal cephalic vein just above the elbow. Irregular soft tissue density is present surrounding the fractured stent and extending into the surrounding soft tissues and to the overlying skin. There is associated overlying skin thickening. The fistula wall appears thickened and calcified in the mid arm. Two overlapping stents are present beginning in the axillary vein and extending into the subclavian vein. These stent systems are incompletely imaged. Again, there is evidence of fracture. The left brachial artery demonstrates mild peripheral calcifications. No suspicious adenopathy or soft tissue lesion. Mild degenerative osteoarthritis at the left glenohumeral joint. No acute osseous abnormality. Review of the MIP images confirms the above findings.  IMPRESSION: 1. Unfortunately, no contrast opacifies the vessels of the left upper extremity rendering this CT arteriogram extremely limited. 2. There is a frankly fractured stent in the proximal draining cephalic vein of the left upper extremity brachiocephalic fistula just above the elbow. Soft tissue density surrounding the site of stent fracture may represent pseudoaneurysm and/or abscess. There is associated inflammatory stranding in the surrounding soft tissues and thickening of the overlying skin. 3. Overlapping stents in the left axillary and left subclavian veins are incompletely imaged. There is evidence of stent fracture. 4. Atherosclerotic calcifications in the brachial artery. Electronically Signed   By: Jacqulynn Cadet M.D.   On: 08/26/2017 08:30   Dg Chest Port 1 View  Result Date: 08/25/2017 CLINICAL DATA:  ET tube EXAM: PORTABLE CHEST 1 VIEW COMPARISON:  08/24/2017 FINDINGS: Endotracheal tube is unchanged. Interval placement of NG tube into the stomach. Cardiomegaly. Increasing bilateral perihilar and lower lobe opacities, likely edema. Suspect small layering effusions. IMPRESSION: Worsening bilateral perihilar and lower lobe opacities, likely edema/CHF. Layering effusions. Electronically Signed   By: Rolm Baptise M.D.   On: 08/25/2017 07:49   Portable Chest X-ray  Result Date: 08/24/2017 CLINICAL DATA:  Endotracheal tube placement EXAM: PORTABLE CHEST 1 VIEW COMPARISON:  08/21/2017 FINDINGS: 1700 hours. Endotracheal tube tip is 5.1 cm above the base of the carina. The cardio pericardial silhouette is enlarged. Inferiorly placed dialysis catheter tip overlies the right atrium. Central parahilar airspace opacity bilaterally suggests edema. Retrocardiac atelectasis or infiltrate evident. Vascular stent device noted in the left axillary region. Telemetry leads overlie the chest. IMPRESSION: 1. Endotracheal tube tip 5.1 cm above the base of the carina. 2. Enlargement of the cardiopericardial  silhouette with probable pulmonary edema. 3. Left base atelectasis/infiltrate with potential tiny left pleural effusion. Electronically Signed   By: Misty Stanley M.D.   On: 08/24/2017 17:26   Dg Abd Portable 1v  Result Date: 08/24/2017 CLINICAL DATA:  57 y/o  M; enteric tube placement. EXAM: PORTABLE ABDOMEN - 1 VIEW COMPARISON:  None. FINDINGS: Normal bowel gas pattern. Vascular calcifications. Enteric tube tip projects over gastric body. Mild levocurvature of lumbar spine. IMPRESSION: Enteric tube tip projects over gastric body. Electronically Signed   By: Kristine Garbe M.D.   On: 08/24/2017 18:18      Scheduled Meds: . ambrisentan  10 mg Oral Daily  . amiodarone  200 mg Oral QPM  . atorvastatin  40 mg Oral QPM  .  calcitRIOL  0.25 mcg Oral Daily  . calcium acetate  1,334 mg Oral TID WC  . cinacalcet  90 mg Oral QPM  . darbepoetin (ARANESP) injection - DIALYSIS  150 mcg Intravenous Q Sat-HD  . levothyroxine  300 mcg Oral QAC breakfast  . mouth rinse  15 mL Mouth Rinse BID  . midodrine  10 mg Oral 3 times per day  . mupirocin ointment  1 application Nasal BID  . Warfarin - Pharmacist Dosing Inpatient   Does not apply q1800   Continuous Infusions: .  ceFAZolin (ANCEF) IV Stopped (08/25/17 1855)     LOS: 9 days    Time spent in minutes: [place], MD [facility] Pager:[phone] [email or web address] Password [facility] 08/26/2017, 10:51 AM

## 2017-08-26 NOTE — Progress Notes (Signed)
INFECTIOUS DISEASE PROGRESS NOTE  ID: Adam [name]. is a 57 y.o. 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  States his BP is always low and he needs his home meds.   Abtx:  Anti-infectives (From admission, onward)   Start     Dose/Rate Route Frequency Ordered Stop   08/24/17 1230  ceFAZolin (ANCEF) 3,000 mg in dextrose 5 % 100 mL IVPB     3,000 mg 260 mL/hr over 30 Minutes Intravenous To Surgery 08/24/17 1151 08/24/17 1404   08/24/17 1148  ceFAZolin (ANCEF) 2-4 GM/100ML-% IVPB    Note to Pharmacy:  Debbe Bales, Meredit: cabinet override      08/24/17 1148 08/24/17 1720   08/20/17 1300  gentamicin (GARAMYCIN) 80 mg in sodium chloride 0.9 % 500 mL irrigation     80 mg Irrigation To ShortStay Surgical 08/19/17 1351 08/20/17 1511   08/20/17 1200  ceFAZolin (ANCEF) 3 g in dextrose 5 % 50 mL IVPB  Status:  Discontinued     3 g 130 mL/hr over 30 Minutes Intravenous To ShortStay Surgical 08/19/17 1351 08/20/17 1954   08/18/17 1800  ceFAZolin (ANCEF) IVPB 1 g/50 mL premix     1 g 100 mL/hr over 30 Minutes Intravenous Every 24 hours 08/18/17 0035     08/18/17 1000  cefTRIAXone (ROCEPHIN) 1 g in sodium chloride 0.9 % 100 mL IVPB  Status:  Discontinued     1 g 200 mL/hr over 30 Minutes Intravenous Every 24 hours 08/17/17 1151 08/18/17 0035   08/18/17 0100  ceFAZolin (ANCEF) IVPB 2g/100 mL premix     2 g 200 mL/hr over 30 Minutes Intravenous  Once 08/18/17 0035 08/18/17 0127   08/17/17 1600  azithromycin (ZITHROMAX) tablet 500 mg  Status:   Discontinued     500 mg Oral Daily 08/17/17 1505 08/18/17 1053   08/17/17 1200  vancomycin (VANCOCIN) 2,000 mg in sodium chloride 0.9 % 500 mL IVPB     2,000 mg 250 mL/hr over 120 Minutes Intravenous  Once 08/17/17 1144 08/17/17 1526   08/17/17 1115  cefTRIAXone (ROCEPHIN) 1 g in sodium chloride 0.9 % 100 mL IVPB     1 g 200 mL/hr over 30 Minutes Intravenous  Once 08/17/17 1111 08/17/17 1313   08/17/17 1115  azithromycin (ZITHROMAX) tablet 500 mg     500 mg Oral  Once 08/17/17 1111 08/17/17 1139      Medications:  Scheduled: . ambrisentan  10 mg Oral Daily  . amiodarone  200 mg Oral QPM  . atorvastatin  40 mg Oral QPM  . calcitRIOL  0.25 mcg Oral Daily  . calcium acetate  1,334 mg Oral TID WC  . chlorhexidine gluconate (MEDLINE KIT)  15 mL Mouth Rinse BID  . cinacalcet  90 mg Oral QPM  . darbepoetin (ARANESP) injection - DIALYSIS  150 mcg Intravenous Q Sat-HD  . levothyroxine  300 mcg Oral QAC breakfast  . mouth rinse  15 mL Mouth Rinse BID  . midodrine  10 mg Oral 3 times per day  .  mupirocin ointment  1 application Nasal BID  . Warfarin - Pharmacist Dosing Inpatient   Does not apply q1800    Objective: Vital signs in last 24 hours: Temp:  [97.4 F (36.3 C)-98.6 F (37 C)] 97.6 F (36.4 C) (04/17 0700) Pulse Rate:  [53-116] 76 (04/17 0700) Resp:  [14-25] 21 (04/17 0700) BP: (70-116)/(29-72) 81/32 (04/17 0700) SpO2:  [93 %-100 %] 99 % (04/17 0700) Arterial Line BP: (94-100)/(49-53) 100/49 (04/16 1800) Weight:  [125 kg (275 lb 9.2 oz)-127.5 kg (281 lb 1.4 oz)] 126.9 kg (279 lb 12.2 oz) (04/17 0444)   General appearance: alert, cooperative and no distress Resp: clear to auscultation bilaterally Cardio: regular rate and rhythm GI: normal findings: bowel sounds normal and soft, non-tender Extremities: edema trace LE.  and LUE dressed. L groin HD cath clean.   Lab Results Recent Labs    08/25/17 0420 08/26/17 0155  WBC 4.8 5.8  HGB 8.7* 8.4*  HCT 27.4* 27.3*  NA  135 135  K 4.0 4.1  CL 99* 98*  CO2 20* 24  BUN 40* 29*  CREATININE 9.22* 7.48*   Liver Panel Recent Labs    08/25/17 0420 08/26/17 0155  PROT  --  6.4*  ALBUMIN 3.0* 2.8*  AST  --  14*  ALT  --  6*  ALKPHOS  --  69  BILITOT  --  0.6  BILIDIR  --  0.2  IBILI  --  0.4   Sedimentation Rate No results for input(s): ESRSEDRATE in the last 72 hours. C-Reactive Protein No results for input(s): CRP in the last 72 hours.  Microbiology: Recent Results (from the past 240 hour(s))  Blood culture (routine x 2)     Status: Abnormal   Collection Time: 08/17/17  9:22 AM  Result Value Ref Range Status   Specimen Description   Final    BLOOD RIGHT FOREARM Performed at [facility] [address], [place], [place] [address]    Special Requests   Final    BOTTLES DRAWN AEROBIC AND ANAEROBIC Blood Culture adequate volume Performed at [facility] [address], [place], [place] [address]    Culture  Setup Time   Final    GRAM POSITIVE COCCI IN BOTH AEROBIC AND ANAEROBIC BOTTLES Organism ID to follow CRITICAL RESULT CALLED TO, READ BACK BY AND VERIFIED WITH: A MASTERS PHARMD 08/18/17 0009 JDW Performed at [facility], [place] [address] [place], [place] [address]    Culture STAPHYLOCOCCUS AUREUS (A)  Final   Report Status 08/20/2017 FINAL  Final   Organism ID, Bacteria STAPHYLOCOCCUS AUREUS  Final      Susceptibility   Staphylococcus aureus - MIC*    CIPROFLOXACIN <=0.5 SENSITIVE Sensitive     ERYTHROMYCIN >=8 RESISTANT Resistant     GENTAMICIN <=0.5 SENSITIVE Sensitive     OXACILLIN 0.5 SENSITIVE Sensitive     TETRACYCLINE <=1 SENSITIVE Sensitive     VANCOMYCIN <=0.5 SENSITIVE Sensitive     TRIMETH/SULFA <=10 SENSITIVE Sensitive     CLINDAMYCIN <=0.25 SENSITIVE Sensitive     RIFAMPIN <=0.5 SENSITIVE Sensitive     Inducible Clindamycin NEGATIVE Sensitive     * STAPHYLOCOCCUS AUREUS  Blood Culture ID Panel (Reflexed)     Status:  Abnormal   Collection Time: 08/17/17  9:22 AM  Result Value Ref Range Status   Enterococcus species NOT DETECTED NOT DETECTED Final   Listeria monocytogenes NOT DETECTED NOT DETECTED Final   Staphylococcus species DETECTED (A)  NOT DETECTED Final    Comment: CRITICAL RESULT CALLED TO, READ BACK BY AND VERIFIED WITH: A MASTERS PHARMD 08/18/17 0009 JDW    Staphylococcus aureus DETECTED (A) NOT DETECTED Final    Comment: Methicillin (oxacillin) susceptible Staphylococcus aureus (MSSA). Preferred therapy is anti staphylococcal beta lactam antibiotic (Cefazolin or Nafcillin), unless clinically contraindicated. CRITICAL RESULT CALLED TO, READ BACK BY AND VERIFIED WITH: A MASTERS PHARMD 08/18/17 0009 JDW    Methicillin resistance NOT DETECTED NOT DETECTED Final   Streptococcus species NOT DETECTED NOT DETECTED Final   Streptococcus agalactiae NOT DETECTED NOT DETECTED Final   Streptococcus pneumoniae NOT DETECTED NOT DETECTED Final   Streptococcus pyogenes NOT DETECTED NOT DETECTED Final   Acinetobacter baumannii NOT DETECTED NOT DETECTED Final   Enterobacteriaceae species NOT DETECTED NOT DETECTED Final   Enterobacter cloacae complex NOT DETECTED NOT DETECTED Final   Escherichia coli NOT DETECTED NOT DETECTED Final   Klebsiella oxytoca NOT DETECTED NOT DETECTED Final   Klebsiella pneumoniae NOT DETECTED NOT DETECTED Final   Proteus species NOT DETECTED NOT DETECTED Final   Serratia marcescens NOT DETECTED NOT DETECTED Final   Haemophilus influenzae NOT DETECTED NOT DETECTED Final   Neisseria meningitidis NOT DETECTED NOT DETECTED Final   Pseudomonas aeruginosa NOT DETECTED NOT DETECTED Final   Candida albicans NOT DETECTED NOT DETECTED Final   Candida glabrata NOT DETECTED NOT DETECTED Final   Candida krusei NOT DETECTED NOT DETECTED Final   Candida parapsilosis NOT DETECTED NOT DETECTED Final   Candida tropicalis NOT DETECTED NOT DETECTED Final    Comment: Performed at [facility], [place]. [address] [place], [place] [address]  Blood culture (routine x 2)     Status: Abnormal   Collection Time: 08/17/17  9:52 AM  Result Value Ref Range Status   Specimen Description   Final    BLOOD RIGHT FOREARM Performed at [facility] [address], [place], [place] [address]    Special Requests   Final    BOTTLES DRAWN AEROBIC AND ANAEROBIC Blood Culture adequate volume Performed at [facility] [address], [place], [place] [address]    Culture  Setup Time   Final    GRAM POSITIVE COCCI IN BOTH AEROBIC AND ANAEROBIC BOTTLES CRITICAL RESULT CALLED TO, READ BACK BY AND VERIFIED WITH: A MASTERS PHARMD 08/18/17 00009 JDW    Culture (A)  Final    STAPHYLOCOCCUS AUREUS SUSCEPTIBILITIES PERFORMED ON PREVIOUS CULTURE WITHIN THE LAST 5 DAYS. Performed at [facility], [place] [address], [place], [place] [address]    Report Status 08/20/2017 FINAL  Final  MRSA PCR Screening     Status: None   Collection Time: 08/17/17  8:40 PM  Result Value Ref Range Status   MRSA by PCR NEGATIVE NEGATIVE Final    Comment:        The GeneXpert MRSA Assay (FDA approved for NASAL specimens only), is one component of a comprehensive MRSA colonization surveillance program. It is not intended to diagnose MRSA infection nor to guide or monitor treatment for MRSA infections. Performed at [facility], [place] [address], [place], [place] [address]   Culture, blood (Routine X 2) w Reflex to ID Panel     Status: None   Collection Time: 08/18/17  3:05 AM  Result Value Ref Range Status   Specimen Description BLOOD RIGHT HAND  Final   Special Requests   Final    BOTTLES DRAWN AEROBIC ONLY Blood Culture adequate  volume   Culture   Final    NO GROWTH 5 DAYS Performed at [facility], [place] [address] [place], [place] [address]    Report Status 08/23/2017 FINAL  Final  Culture, blood (Routine X 2) w Reflex to ID Panel     Status: None   Collection  Time: 08/18/17  3:05 AM  Result Value Ref Range Status   Specimen Description BLOOD RIGHT HAND  Final   Special Requests   Final    BOTTLES DRAWN AEROBIC ONLY Blood Culture adequate volume   Culture   Final    NO GROWTH 5 DAYS Performed at [facility], [place] [address], [place], [place] [address]    Report Status 08/23/2017 FINAL  Final  Culture, sputum-assessment     Status: None   Collection Time: 08/18/17  9:32 PM  Result Value Ref Range Status   Specimen Description EXPECTORATED SPUTUM  Final   Special Requests NONE  Final   Sputum evaluation   Final    RESULT CALLED TO, READ BACK BY AND VERIFIED WITH: Miguel Dibble RN [id number] 08/18/17 A BROWNING Performed at [facility], [place] [address] [place], [place] [address]    Report Status 08/18/2017 FINAL  Final  Surgical PCR screen     Status: Abnormal   Collection Time: 08/20/17 10:11 AM  Result Value Ref Range Status   MRSA, PCR NEGATIVE NEGATIVE Final   Staphylococcus aureus POSITIVE (A) NEGATIVE Final    Comment: (NOTE) The Xpert SA Assay (FDA approved for NASAL specimens in patients 72 years of age and older), is one component of a comprehensive surveillance program. It is not intended to diagnose infection nor to guide or monitor treatment. Performed at [facility], [place] [address] [place], [place] [address]   Aerobic/Anaerobic Culture (surgical/deep wound)     Status: None (Preliminary result)   Collection Time: 08/24/17  2:06 PM  Result Value Ref Range Status   Specimen Description WOUND  Final   Special Requests RUTURED AV FISTULA  Final   Gram Stain NO WBC SEEN NO ORGANISMS SEEN   Final   Culture   Final    CULTURE REINCUBATED FOR BETTER GROWTH Performed at [facility], [place] [address] [place], [place] [address]    Report Status PENDING  Incomplete  Aerobic/Anaerobic Culture (surgical/deep wound)     Status: None (Preliminary result)   Collection Time: 08/24/17  2:12 PM  Result Value Ref Range  Status   Specimen Description WOUND UPPER ARM PROXIMAL FISTULA  Final   Special Requests SPEC B ON SWABS  Final   Gram Stain   Final    FEW WBC PRESENT, PREDOMINANTLY PMN NO ORGANISMS SEEN    Culture   Final    FEW STAPHYLOCOCCUS AUREUS SUSCEPTIBILITIES TO FOLLOW Performed at [facility], [place] [address] [place], [place] [address]    Report Status PENDING  Incomplete    Studies/Results: Ct Angio Up Extrem Left W &/or Wo Contast  Result Date: 08/26/2017 CLINICAL DATA:  57 year old male with a left upper extremity brachiocephalic arteriovenous fistula and left upper extremity pain. EXAM: CT ANGIOGRAPHY OF THE left upperEXTREMITY TECHNIQUE: Multidetector CT imaging of the left upper extremitywas performed using the standard protocol during bolus administration of intravenous contrast. Multiplanar CT image reconstructions and MIPs were obtained to evaluate the vascular anatomy. CONTRAST:  157m ISOVUE-370 IOPAMIDOL (ISOVUE-370) INJECTION 76% COMPARISON:  Prior radiographs of the left upper extremity 08/18/2017 FINDINGS:  Unfortunately, there is no contrast opacification of the vascular structures rendering this CT arteriogram of the left upper extremity extremely limited. The patient was going to return for repeat imaging but had already gone to surgery at that point. Therefore, repeat imaging was deferred. Left upper extremity brachiocephalic arteriovenous fistula. There is a discontinuous and fractured stent in the proximal cephalic vein just above the elbow. Irregular soft tissue density is present surrounding the fractured stent and extending into the surrounding soft tissues and to the overlying skin. There is associated overlying skin thickening. The fistula wall appears thickened and calcified in the mid arm. Two overlapping stents are present beginning in the axillary vein and extending into the subclavian vein. These stent systems are incompletely imaged. Again, there is evidence of  fracture. The left brachial artery demonstrates mild peripheral calcifications. No suspicious adenopathy or soft tissue lesion. Mild degenerative osteoarthritis at the left glenohumeral joint. No acute osseous abnormality. Review of the MIP images confirms the above findings. IMPRESSION: 1. Unfortunately, no contrast opacifies the vessels of the left upper extremity rendering this CT arteriogram extremely limited. 2. There is a frankly fractured stent in the proximal draining cephalic vein of the left upper extremity brachiocephalic fistula just above the elbow. Soft tissue density surrounding the site of stent fracture may represent pseudoaneurysm and/or abscess. There is associated inflammatory stranding in the surrounding soft tissues and thickening of the overlying skin. 3. Overlapping stents in the left axillary and left subclavian veins are incompletely imaged. There is evidence of stent fracture. 4. Atherosclerotic calcifications in the brachial artery. Electronically Signed   By: Jacqulynn Cadet M.D.   On: 08/26/2017 08:30   Dg Chest Port 1 View  Result Date: 08/25/2017 CLINICAL DATA:  ET tube EXAM: PORTABLE CHEST 1 VIEW COMPARISON:  08/24/2017 FINDINGS: Endotracheal tube is unchanged. Interval placement of NG tube into the stomach. Cardiomegaly. Increasing bilateral perihilar and lower lobe opacities, likely edema. Suspect small layering effusions. IMPRESSION: Worsening bilateral perihilar and lower lobe opacities, likely edema/CHF. Layering effusions. Electronically Signed   By: Rolm Baptise M.D.   On: 08/25/2017 07:49   Portable Chest X-ray  Result Date: 08/24/2017 CLINICAL DATA:  Endotracheal tube placement EXAM: PORTABLE CHEST 1 VIEW COMPARISON:  08/21/2017 FINDINGS: 1700 hours. Endotracheal tube tip is 5.1 cm above the base of the carina. The cardio pericardial silhouette is enlarged. Inferiorly placed dialysis catheter tip overlies the right atrium. Central parahilar airspace opacity  bilaterally suggests edema. Retrocardiac atelectasis or infiltrate evident. Vascular stent device noted in the left axillary region. Telemetry leads overlie the chest. IMPRESSION: 1. Endotracheal tube tip 5.1 cm above the base of the carina. 2. Enlargement of the cardiopericardial silhouette with probable pulmonary edema. 3. Left base atelectasis/infiltrate with potential tiny left pleural effusion. Electronically Signed   By: Misty Stanley M.D.   On: 08/24/2017 17:26   Dg Abd Portable 1v  Result Date: 08/24/2017 CLINICAL DATA:  57 y/o  M; enteric tube placement. EXAM: PORTABLE ABDOMEN - 1 VIEW COMPARISON:  None. FINDINGS: Normal bowel gas pattern. Vascular calcifications. Enteric tube tip projects over gastric body. Mild levocurvature of lumbar spine. IMPRESSION: Enteric tube tip projects over gastric body. Electronically Signed   By: Kristine Garbe M.D.   On: 08/24/2017 18:18     Assessment/Plan: MSSA bacteremia ESRD ICD (removed lead 4-11) HD fistula/graft (removed 4-15), pus and necrotic tissue VDRF (extubated 4-16)  Total days of antibiotics: 9 ancef   Plan is for 6 weeks of ancef with HD TEE  pending (TTE 4-9 negative) Repeat BCx are ngtd Persistent hypotension overnight. He is asx, states this is his normal BP.   Will follow peripherally, waiting TEE          Bobby Rumpf MD, FACP Infectious Diseases (pager) [phone] [email or web address] 08/26/2017, 8:52 AM  LOS: 9 days

## 2017-08-26 NOTE — Progress Notes (Signed)
Progress Note    08/26/2017 11:41 AM 2 Days Post-Op  Subjective:  No complaints other than he wished his catheter was in his neck and not his groin.  afebrile  Vitals:   08/26/17 0800 08/26/17 1100  BP:  (!) 92/56  Pulse: 80 79  Resp: 20 20  Temp:  98.5 F (36.9 C)  SpO2: 98% 99%    Physical Exam: General:  No distress Lungs:  Non labored Incisions:  Left arm wound does not have any purulence present; there is some fibrinous tissue in the wound bed.  There are also staples distal and proximal that are clean and dry. Extremities:  Unable to palpate left radial pulse, but left hand is warm and motor and sensory are in tact.   CBC    Component Value Date/Time   WBC 5.8 08/26/2017 0155   RBC 3.01 (L) 08/26/2017 0155   HGB 8.4 (L) 08/26/2017 0155   HCT 27.3 (L) 08/26/2017 0155   PLT 142 (L) 08/26/2017 0155   MCV 90.7 08/26/2017 0155   MCH 27.9 08/26/2017 0155   MCHC 30.8 08/26/2017 0155   RDW 19.2 (H) 08/26/2017 0155   LYMPHSABS 0.5 (L) 08/17/2017 0902   MONOABS 0.4 08/17/2017 0902   EOSABS 0.0 08/17/2017 0902   BASOSABS 0.0 08/17/2017 0902    BMET    Component Value Date/Time   NA 135 08/26/2017 0155   K 4.1 08/26/2017 0155   CL 98 (L) 08/26/2017 0155   CO2 24 08/26/2017 0155   GLUCOSE 80 08/26/2017 0155   BUN 29 (H) 08/26/2017 0155   CREATININE 7.48 (H) 08/26/2017 0155   CREATININE 4.91 (H) 08/13/2015 1223   CALCIUM 7.5 (L) 08/26/2017 0155   GFRNONAA 7 (L) 08/26/2017 0155   GFRNONAA 13 (L) 07/27/2014 1539   GFRAA 8 (L) 08/26/2017 0155   GFRAA 15 (L) 07/27/2014 1539    INR    Component Value Date/Time   INR 1.57 08/26/2017 0155     Intake/Output Summary (Last 24 hours) at 08/26/2017 1141 Last data filed at 08/25/2017 1835 Gross per 24 hour  Intake 310 ml  Output 2501 ml  Net -2191 ml    Specimen Description WOUND UPPER ARM PROXIMAL FISTULA   Special Requests SPEC B ON SWABS   Gram Stain FEW WBC PRESENT, PREDOMINANTLY PMN  NO ORGANISMS SEEN   Performed at [facility], [address], [place], [place] [address]     Culture FEW STAPHYLOCOCCUS AUREUS  NO ANAEROBES ISOLATED; CULTURE IN PROGRESS FOR 5 DAYS     Report Status PENDING   Organism ID, Bacteria STAPHYLOCOCCUS AUREUS     Assessment:  57 y.o. male is s/p:  1. Cannulation of right common femoral artery under ultrasound guidance 2. Placement of right femoral arterial line 3. Incision and drainage of left upper arm abscess 4. Excision of distal half of left brachiocephalic arteriovenous fistula with stent included 5. Vein patch angioplasty of left brachial artery 6. Cannulation of left femoral vein under ultrasound guidance 7. Placement of left femoral tunneled dialysis catheter    2 Days Post-Op  Plan: -pt doing well-wound is without purulence-there is some fibrinous tissue in the wound bed.  Will do another day of wet to dry dressing and then most likely plan for wound vac tomorrow -wound cx with few staph aureus - per ID plan is for 6 weeks of Ancef with HD    Leontine Locket, PA-C [facility] [phone] 08/26/2017 11:41 AM

## 2017-08-27 ENCOUNTER — Other Ambulatory Visit: Payer: Self-pay | Admitting: *Deleted

## 2017-08-27 LAB — POCT I-STAT 7, (LYTES, BLD GAS, ICA,H+H)
ACID-BASE DEFICIT: 2 mmol/L (ref 0.0–2.0)
ACID-BASE EXCESS: 1 mmol/L (ref 0.0–2.0)
BICARBONATE: 25.4 mmol/L (ref 20.0–28.0)
Bicarbonate: 24.6 mmol/L (ref 20.0–28.0)
CALCIUM ION: 1.02 mmol/L — AB (ref 1.15–1.40)
CALCIUM ION: 0.92 mmol/L — AB (ref 1.15–1.40)
HCT: 28 % — ABNORMAL LOW (ref 39.0–52.0)
HEMATOCRIT: 32 % — AB (ref 39.0–52.0)
HEMOGLOBIN: 10.9 g/dL — AB (ref 13.0–17.0)
Hemoglobin: 9.5 g/dL — ABNORMAL LOW (ref 13.0–17.0)
O2 SAT: 93 %
O2 SAT: 97 %
PH ART: 7.33 — AB (ref 7.350–7.450)
PH ART: 7.399 (ref 7.350–7.450)
PO2 ART: 94 mmHg (ref 83.0–108.0)
POTASSIUM: 4.3 mmol/L (ref 3.5–5.1)
Potassium: 4.3 mmol/L (ref 3.5–5.1)
SODIUM: 135 mmol/L (ref 135–145)
SODIUM: 136 mmol/L (ref 135–145)
TCO2: 26 mmol/L (ref 22–32)
TCO2: 27 mmol/L (ref 22–32)
pCO2 arterial: 46.7 mmHg (ref 32.0–48.0)
pCO2 arterial: 41.2 mmHg (ref 32.0–48.0)
pO2, Arterial: 73 mmHg — ABNORMAL LOW (ref 83.0–108.0)

## 2017-08-27 LAB — RENAL FUNCTION PANEL
ALBUMIN: 2.8 g/dL — AB (ref 3.5–5.0)
ANION GAP: 13 (ref 5–15)
BUN: 20 mg/dL (ref 6–20)
CO2: 24 mmol/L (ref 22–32)
Calcium: 7.3 mg/dL — ABNORMAL LOW (ref 8.9–10.3)
Chloride: 98 mmol/L — ABNORMAL LOW (ref 101–111)
Creatinine, Ser: 6.15 mg/dL — ABNORMAL HIGH (ref 0.61–1.24)
GFR calc Af Amer: 11 mL/min — ABNORMAL LOW (ref >60)
GFR calc non Af Amer: 9 mL/min — ABNORMAL LOW (ref >60)
Glucose, Bld: 67 mg/dL (ref 65–99)
POTASSIUM: 3.6 mmol/L (ref 3.5–5.1)
Phosphorus: 3.6 mg/dL (ref 2.5–4.6)
Sodium: 135 mmol/L (ref 135–145)

## 2017-08-27 LAB — GLUCOSE, CAPILLARY: GLUCOSE-CAPILLARY: 72 mg/dL (ref 65–99)

## 2017-08-27 LAB — PROTIME-INR
INR: 1.76
Prothrombin Time: 20.4 seconds — ABNORMAL HIGH (ref 11.4–15.2)

## 2017-08-27 MED ORDER — WARFARIN SODIUM 3 MG PO TABS
6.0000 mg | ORAL_TABLET | Freq: Once | ORAL | Status: DC
  Filled 2017-08-27 (×2): qty 2

## 2017-08-27 NOTE — Progress Notes (Signed)
INFECTIOUS DISEASE PROGRESS NOTE  ID: Adam [name]. is a 57 y.o. 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[place])   Acute encephalopathy   Hypotension  Subjective: f/u  Abtx:  Anti-infectives (From admission, onward)   Start     Dose/Rate Route Frequency Ordered Stop   08/24/17 1230  ceFAZolin (ANCEF) 3,000 mg in dextrose 5 % 100 mL IVPB     3,000 mg 260 mL/hr over 30 Minutes Intravenous To Surgery 08/24/17 1151 08/24/17 1404   08/24/17 1148  ceFAZolin (ANCEF) 2-4 GM/100ML-% IVPB    Note to Pharmacy:  Debbe Bales, [place]: cabinet override      08/24/17 1148 08/24/17 1720   08/20/17 1300  gentamicin (GARAMYCIN) 80 mg in sodium chloride 0.9 % 500 mL irrigation     80 mg Irrigation To ShortStay Surgical 08/19/17 1351 08/20/17 1511   08/20/17 1200  ceFAZolin (ANCEF) 3 g in dextrose 5 % 50 mL IVPB  Status:  Discontinued     3 g 130 mL/hr over 30 Minutes Intravenous To ShortStay Surgical 08/19/17 1351 08/20/17 1954   08/18/17 1800  ceFAZolin (ANCEF) IVPB 1 g/50 mL premix     1 g 100 mL/hr over 30 Minutes Intravenous Every 24 hours 08/18/17 0035     08/18/17 1000  cefTRIAXone (ROCEPHIN) 1 g in sodium chloride 0.9 % 100 mL IVPB  Status:  Discontinued     1 g 200 mL/hr over 30 Minutes Intravenous Every 24 hours 08/17/17 1151 08/18/17 0035   08/18/17 0100  ceFAZolin (ANCEF) IVPB 2g/100 mL premix     2 g 200 mL/hr over 30 Minutes Intravenous  Once 08/18/17 0035 08/18/17 0127   08/17/17 1600  azithromycin (ZITHROMAX) tablet 500 mg  Status:  Discontinued     500 mg Oral Daily 08/17/17 1505 08/18/17 1053   08/17/17 1200  vancomycin (VANCOCIN) 2,000  mg in sodium chloride 0.9 % 500 mL IVPB     2,000 mg 250 mL/hr over 120 Minutes Intravenous  Once 08/17/17 1144 08/17/17 1526   08/17/17 1115  cefTRIAXone (ROCEPHIN) 1 g in sodium chloride 0.9 % 100 mL IVPB     1 g 200 mL/hr over 30 Minutes Intravenous  Once 08/17/17 1111 08/17/17 1313   08/17/17 1115  azithromycin (ZITHROMAX) tablet 500 mg     500 mg Oral  Once 08/17/17 1111 08/17/17 1139      Medications:  Scheduled: . ambrisentan  10 mg Oral Daily  . amiodarone  200 mg Oral QPM  . atorvastatin  40 mg Oral QPM  . calcitRIOL  0.25 mcg Oral Daily  . calcium acetate  1,334 mg Oral TID WC  . cinacalcet  90 mg Oral QPM  . darbepoetin (ARANESP) injection - DIALYSIS  150 mcg Intravenous Q Sat-HD  . levothyroxine  300 mcg Oral QAC breakfast  . mouth rinse  15 mL Mouth Rinse BID  . midodrine  10 mg Oral 3 times per day  . mupirocin ointment  1 application Nasal BID  . warfarin  6 mg Oral ONCE-1800  . Warfarin - Pharmacist Dosing Inpatient   Does not apply q1800    Objective: Vital signs in  last 24 hours: Temp:  [97.5 F (36.4 C)-98.7 F (37.1 C)] 97.8 F (36.6 C) (04/18 0712) Pulse Rate:  [68-82] 78 (04/18 0712) Resp:  [16-24] 16 (04/18 0712) BP: (63-115)/(24-73) 93/70 (04/18 0712) SpO2:  [91 %-100 %] 97 % (04/18 0712) Weight:  [125.2 kg (276 lb 0.3 oz)-127 kg (279 lb 15.8 oz)] 125.2 kg (276 lb 0.3 oz) (04/18 0538)   (none)  Lab Results Recent Labs    08/25/17 0420 08/26/17 0155 08/27/17 0209  WBC 4.8 5.8  --   HGB 8.7* 8.4*  --   HCT 27.4* 27.3*  --   NA 135 135 135  K 4.0 4.1 3.6  CL 99* 98* 98*  CO2 20* 24 24  BUN 40* 29* 20  CREATININE 9.22* 7.48* 6.15*   Liver Panel Recent Labs    08/26/17 0155 08/27/17 0209  PROT 6.4*  --   ALBUMIN 2.8* 2.8*  AST 14*  --   ALT 6*  --   ALKPHOS 69  --   BILITOT 0.6  --   BILIDIR 0.2  --   IBILI 0.4  --    Sedimentation Rate No results for input(s): ESRSEDRATE in the last 72 hours. C-Reactive Protein No results  for input(s): CRP in the last 72 hours.  Microbiology: Recent Results (from the past 240 hour(s))  MRSA PCR Screening     Status: None   Collection Time: 08/17/17  8:40 PM  Result Value Ref Range Status   MRSA by PCR NEGATIVE NEGATIVE Final    Comment:        The GeneXpert MRSA Assay (FDA approved for NASAL specimens only), is one component of a comprehensive MRSA colonization surveillance program. It is not intended to diagnose MRSA infection nor to guide or monitor treatment for MRSA infections. Performed at [facility], [place] [address] [place], [place] [address]   Culture, blood (Routine X 2) w Reflex to ID Panel     Status: None   Collection Time: 08/18/17  3:05 AM  Result Value Ref Range Status   Specimen Description BLOOD RIGHT HAND  Final   Special Requests   Final    BOTTLES DRAWN AEROBIC ONLY Blood Culture adequate volume   Culture   Final    NO GROWTH 5 DAYS Performed at [facility], [place] [address], [place], [place] [address]    Report Status 08/23/2017 FINAL  Final  Culture, blood (Routine X 2) w Reflex to ID Panel     Status: None   Collection Time: 08/18/17  3:05 AM  Result Value Ref Range Status   Specimen Description BLOOD RIGHT HAND  Final   Special Requests   Final    BOTTLES DRAWN AEROBIC ONLY Blood Culture adequate volume   Culture   Final    NO GROWTH 5 DAYS Performed at [facility], [place] [address], [place], [place] [address]    Report Status 08/23/2017 FINAL  Final  Culture, sputum-assessment     Status: None   Collection Time: 08/18/17  9:32 PM  Result Value Ref Range Status   Specimen Description EXPECTORATED SPUTUM  Final   Special Requests NONE  Final   Sputum evaluation   Final    RESULT CALLED TO, READ BACK BY AND VERIFIED WITH: Miguel Dibble RN [id number] 08/18/17 A BROWNING Performed at [facility], [place] [address], [place],  [address]    Report Status 08/18/2017 FINAL  Final  Surgical PCR screen  Status:  Abnormal   Collection Time: 08/20/17 10:11 AM  Result Value Ref Range Status   MRSA, PCR NEGATIVE NEGATIVE Final   Staphylococcus aureus POSITIVE (A) NEGATIVE Final    Comment: (NOTE) The Xpert SA Assay (FDA approved for NASAL specimens in patients 84 years of age and older), is one component of a comprehensive surveillance program. It is not intended to diagnose infection nor to guide or monitor treatment. Performed at [facility], [place] [address], [place], [place] [address]   Aerobic/Anaerobic Culture (surgical/deep wound)     Status: None (Preliminary result)   Collection Time: 08/24/17  2:06 PM  Result Value Ref Range Status   Specimen Description WOUND  Final   Special Requests RUTURED AV FISTULA  Final   Gram Stain NO WBC SEEN NO ORGANISMS SEEN   Final   Culture   Final    RARE STAPHYLOCOCCUS AUREUS NO ANAEROBES ISOLATED; CULTURE IN PROGRESS FOR 5 DAYS    Report Status PENDING  Incomplete   Organism ID, Bacteria STAPHYLOCOCCUS AUREUS  Final      Susceptibility   Staphylococcus aureus - MIC*    CIPROFLOXACIN <=0.5 SENSITIVE Sensitive     ERYTHROMYCIN >=8 RESISTANT Resistant     GENTAMICIN <=0.5 SENSITIVE Sensitive     OXACILLIN 0.5 SENSITIVE Sensitive     TETRACYCLINE <=1 SENSITIVE Sensitive     VANCOMYCIN <=0.5 SENSITIVE Sensitive     TRIMETH/SULFA <=10 SENSITIVE Sensitive     CLINDAMYCIN <=0.25 SENSITIVE Sensitive     RIFAMPIN <=0.5 SENSITIVE Sensitive     Inducible Clindamycin Value in next row Sensitive      NEGATIVEPerformed at [facility] [address], [place], [place] [address]    * RARE STAPHYLOCOCCUS AUREUS  Aerobic/Anaerobic Culture (surgical/deep wound)     Status: None (Preliminary result)   Collection Time: 08/24/17  2:12 PM  Result Value Ref Range Status   Specimen Description WOUND UPPER ARM PROXIMAL FISTULA  Final   Special Requests SPEC B ON SWABS  Final   Gram Stain   Final    FEW WBC PRESENT, PREDOMINANTLY PMN NO ORGANISMS  SEEN Performed at [facility], [place] [address], [place], [place] [address]    Culture   Final    FEW STAPHYLOCOCCUS AUREUS NO ANAEROBES ISOLATED; CULTURE IN PROGRESS FOR 5 DAYS    Report Status PENDING  Incomplete   Organism ID, Bacteria STAPHYLOCOCCUS AUREUS  Final      Susceptibility   Staphylococcus aureus - MIC*    CIPROFLOXACIN <=0.5 SENSITIVE Sensitive     ERYTHROMYCIN >=8 RESISTANT Resistant     GENTAMICIN <=0.5 SENSITIVE Sensitive     OXACILLIN 0.5 SENSITIVE Sensitive     TETRACYCLINE <=1 SENSITIVE Sensitive     VANCOMYCIN <=0.5 SENSITIVE Sensitive     TRIMETH/SULFA <=10 SENSITIVE Sensitive     CLINDAMYCIN <=0.25 SENSITIVE Sensitive     RIFAMPIN <=0.5 SENSITIVE Sensitive     Inducible Clindamycin NEGATIVE Sensitive     * FEW STAPHYLOCOCCUS AUREUS    Studies/Results: No results found.   Assessment/Plan: MSSA bacteremia ESRD ICD (removed lead 4-11) HD fistula/graft (removed 4-15), pus and necrotic tissue VDRF (extubated 4-16)  Total days of antibiotics:10 ancef   TEE result still pending. Repeat BCx 4-9 (-) He can be d/c on ancef for 6 weeks He can get this at HD Will need repeat BCx 1 week after completing anbx.  Available as needed.    Allergies  Allergen Reactions  .  Enalaprilat Swelling    Mouth swelling  . Vasotec [Enalapril] Swelling    MOUTH SWELLING  . Ivp Dye [Iodinated Diagnostic Agents] Nausea And Vomiting    OPAT Orders Discharge antibiotics: Per pharmacy protocol ancef Duration: 32 days End Date: 09-28-17  [facility] Care Per Protocol:  Labs weekly while on IV antibiotics: _x_ CBC with differential __ BMP _x_ CMP __ CRP __ ESR __ Vancomycin trough  __ Please pull PIC at completion of IV antibiotics __ Please leave PIC in place until doctor has seen patient or been notified  Fax weekly labs to [phone]  Clinic Follow Up Appt: Renal, PCP, vascular          Bobby Rumpf MD, FACP Infectious Diseases (pager)  [phone] [email or web address] 08/27/2017, 10:53 AM  LOS: 10 days

## 2017-08-27 NOTE — Care Management Note (Deleted)
Case Management Note [name] RN, BSN Unit 4E-Case Manager [phone]  Patient Details  Name: Adam [name]. MRN: [id number] Date of Birth: Sep 19, 1960  Subjective/Objective:  Pt admitted with Fever, positive for MSSA with sepsis, ICD infection s/p removal-                  Action/Plan: PTA pt lived at home with wife- hx of ESRD with home HD m-f- per ID notes pt will need 6 wks of IV abx- pt will need orders for IV abx and set up with [facility] prior to discharge to assist with home IV abx needs- CM to follow, Life [facility] has already been arranged and is at the bedside for discharge.   Expected Discharge Date:  (unknown)               Expected Discharge Plan:  [place]  In-House Referral:  NA  Discharge planning Services  CM Consult  Post Acute Care Choice:  Home Health, Durable Medical Equipment Choice offered to:  Patient, Spouse  DME Arranged:  Life vest DME Agency:  [facility]  HH Arranged:  IV Antibiotics HH Agency:     Status of Service:  In process, will continue to follow  If discussed at Long Length of Stay Meetings, dates discussed:    Discharge Disposition:   Additional Comments: 08/27/2017  CM informed that pt will in fact transition to a HD center while he needs IV antibiotics - Renal will ensure pt is clipped prior to discharge and renal service will facilitate IV antibiotics administration during outpt HD treatments.  CM requested DME order for wound vac, HHRN and face to face order, IV antibiotic script for [facility] infusion  08/26/17 Pt now in [place].  Pt will have wound vac at discharge - per surgery pt can use [facility] for wound vac.  Referral given to agency - form placed on shadow chart and needs to be completed.  CM informed surgery PA that form is on chart.  [facility] already following pt for IV infusion (antibiotic and HHRN). Maryclare Labrador, RN 08/27/2017, 9:42 AM

## 2017-08-27 NOTE — Progress Notes (Signed)
PROGRESS NOTE    Adam Alm.   ONG:[id number]  DOB: 12/21/1960  DOA: 08/17/2017 PCP: [name], MD   Brief Narrative:  Adam Alm. is a 57 y/o with ESRD on home hemodialysis on Monday through Friday for the past year, atrial fibrillation on Coumadin, chronic hypertension, nonischemic cardiomyopathy with AICD, pulmonary hypertension, moderate aortic stenosis He has been feeling well up until one day prior to admission when he developed a fever (102.8) and dyspnea.  Apparently his wife stated that he had felt warm for a few days and may have been having fevers and not realizing it.  He has been doing his dialysis and his fistula has felt fine.  He had a dry cough for 1 week. He was admitted at [facility] in transfer to [facility] for ongoing dialysis.  Blood cultures became positive for MSSA.  Pt subsequently had defibrillator and fistula removed. Currently on antibiotics and perioperative period (fistula removed 4/15)  Subjective: Pt reports no new problems.  Assessment & Plan:   Principal Problem:   MSSA bacteremia with sepsis (fevers leukocytosis hypotension) - In setting of AICD - Appreciate management by ID- Currently on Ancef - Pt is s/p removal of defibillator - Vascular surgery removed fistula as patient complaining of increased pain and on their evaluation there was suspicion of stent fx. Pt take to OR and found to have puss and necrotic tissue as such fistula/graft removed on 4/15.  Active Problems:    Atrial fibrillation-Elevated INR - Continue patient on Amiodarone - Pharmacy managing coumadin  Acute on chronic thrombocytopenia - It appears that platelets have been between 101- 150 in the past - INR is elevated and not on any other anticoagulation - Continue bleeding precautions   ESRD/acute on chronic hypotension - On dialysis Monday through Friday and midodrine - Being managed by nephrology - See fistula discussion above.   Severe  pulmonary hypertension Tadalafil is being replaced with Cialis-we will need to hold this due to hypotension -Continue ambrisentan  Hyperlipidemia -Continue statin  Hypothyroidism Synthroid  DVT prophylaxis: INR elevated-Coumadin Code Status: Full code Family Communication: discussed with patient and family member at bedside.  Disposition Plan: Pending specialist recommendations  Consultants:   ID  Nephrology  Procedures:   ICD (removed lead 4-11) HD fistula/graft (removed 4-15), pus and necrotic tissue VDRF (extubated 4-16)  Antimicrobials:  Anti-infectives (From admission, onward)   Start     Dose/Rate Route Frequency Ordered Stop   08/24/17 1230  ceFAZolin (ANCEF) 3,000 mg in dextrose 5 % 100 mL IVPB     3,000 mg 260 mL/hr over 30 Minutes Intravenous To Surgery 08/24/17 1151 08/24/17 1404   08/24/17 1148  ceFAZolin (ANCEF) 2-4 GM/100ML-% IVPB    Note to Pharmacy:  Debbe Bales, Meredit: cabinet override      08/24/17 1148 08/24/17 1720   08/20/17 1300  gentamicin (GARAMYCIN) 80 mg in sodium chloride 0.9 % 500 mL irrigation     80 mg Irrigation To ShortStay Surgical 08/19/17 1351 08/20/17 1511   08/20/17 1200  ceFAZolin (ANCEF) 3 g in dextrose 5 % 50 mL IVPB  Status:  Discontinued     3 g 130 mL/hr over 30 Minutes Intravenous To ShortStay Surgical 08/19/17 1351 08/20/17 1954   08/18/17 1800  ceFAZolin (ANCEF) IVPB 1 g/50 mL premix     1 g 100 mL/hr over 30 Minutes Intravenous Every 24 hours 08/18/17 0035     08/18/17 1000  cefTRIAXone (ROCEPHIN) 1 g in sodium  chloride 0.9 % 100 mL IVPB  Status:  Discontinued     1 g 200 mL/hr over 30 Minutes Intravenous Every 24 hours 08/17/17 1151 08/18/17 0035   08/18/17 0100  ceFAZolin (ANCEF) IVPB 2g/100 mL premix     2 g 200 mL/hr over 30 Minutes Intravenous  Once 08/18/17 0035 08/18/17 0127   08/17/17 1600  azithromycin (ZITHROMAX) tablet 500 mg  Status:  Discontinued     500 mg Oral Daily 08/17/17 1505 08/18/17 1053   08/17/17  1200  vancomycin (VANCOCIN) 2,000 mg in sodium chloride 0.9 % 500 mL IVPB     2,000 mg 250 mL/hr over 120 Minutes Intravenous  Once 08/17/17 1144 08/17/17 1526   08/17/17 1115  cefTRIAXone (ROCEPHIN) 1 g in sodium chloride 0.9 % 100 mL IVPB     1 g 200 mL/hr over 30 Minutes Intravenous  Once 08/17/17 1111 08/17/17 1313   08/17/17 1115  azithromycin (ZITHROMAX) tablet 500 mg     500 mg Oral  Once 08/17/17 1111 08/17/17 1139       Objective: Vitals:   08/26/17 2324 08/27/17 0330 08/27/17 0538 08/27/17 0712  BP: (!) 94/41 (!) 89/53  93/70  Pulse: 80 75  78  Resp: 20 19  16   Temp: 98.4 F (36.9 C) 97.6 F (36.4 C)  97.8 F (36.6 C)  TempSrc: Oral Oral  Oral  SpO2: 91% 94%  97%  Weight:   125.2 kg (276 lb 0.3 oz)   Height:        Intake/Output Summary (Last 24 hours) at 08/27/2017 0913 Last data filed at 08/26/2017 1700 Gross per 24 hour  Intake 240 ml  Output 1500 ml  Net -1260 ml   Filed Weights   08/26/17 1220 08/26/17 1530 08/27/17 0538  Weight: 127 kg (279 lb 15.8 oz) 125.5 kg (276 lb 10.8 oz) 125.2 kg (276 lb 0.3 oz)    Examination: General exam: Pt in nad, Appears comfortable HEENT: PERRLA, oral mucosa moist, no sclera icterus or thrush Respiratory system: Clear to auscultation.  Has a dry cough respiratory effort normal. Cardiovascular system: S1 & S2 heard, RRR.   Gastrointestinal system: Abdomen soft, non-tender, nondistended. Normal bowel sound. No organomegaly Central nervous system: Alert and oriented. No focal neurological deficits. Extremities: No cyanosis, clubbing or edema Skin: No rashes or ulcers, pt has gauze over left arm where fistula was removed. Psychiatry:  Mood & affect appropriate.   Data Reviewed: I have personally reviewed following labs and imaging studies  CBC: Recent Labs  Lab 08/21/17 0432 08/22/17 0656 08/24/17 1324 08/24/17 1400 08/24/17 1654 08/25/17 0420 08/26/17 0155  WBC 4.7 6.2  --   --  7.4 4.8 5.8  HGB 8.1* 8.2* 10.9*  9.5* 8.7* 8.7* 8.4*  HCT 25.2* 25.8* 32.0* 28.0* 27.3* 27.4* 27.3*  MCV 91.0 89.3  --   --  89.2 88.4 90.7  PLT 78* 101*  --   --  136* 118* 315*   Basic Metabolic Panel: Recent Labs  Lab 08/24/17 0358  08/24/17 1400 08/24/17 1654 08/25/17 0420 08/26/17 0155 08/27/17 0209  NA 134*   < > 136 134* 135 135 135  K 4.0   < > 4.3 4.7 4.0 4.1 3.6  CL 97*  --   --  99* 99* 98* 98*  CO2 22  --   --  22 20* 24 24  GLUCOSE 76  --   --  91 81 80 67  BUN 31*  --   --  38* 40* 29* 20  CREATININE 7.94*  --   --  8.62* 9.22* 7.48* 6.15*  CALCIUM 7.8*  --   --  7.4* 7.6* 7.5* 7.3*  MG  --   --   --   --  1.8 1.8  --   PHOS 2.8  --   --  3.2 3.5 3.8 3.6   < > = values in this interval not displayed.   GFR: Estimated Creatinine Clearance: 19 mL/min (A) (by C-G formula based on SCr of 6.15 mg/dL (H)). Liver Function Tests: Recent Labs  Lab 08/24/17 0358 08/24/17 1654 08/25/17 0420 08/26/17 0155 08/27/17 0209  AST  --   --   --  14*  --   ALT  --   --   --  6*  --   ALKPHOS  --   --   --  69  --   BILITOT  --   --   --  0.6  --   PROT  --   --   --  6.4*  --   ALBUMIN 2.9* 3.0* 3.0* 2.8* 2.8*   No results for input(s): LIPASE, AMYLASE in the last 168 hours. No results for input(s): AMMONIA in the last 168 hours. Coagulation Profile: Recent Labs  Lab 08/23/17 0410 08/24/17 0358 08/25/17 0420 08/26/17 0155 08/27/17 0209  INR 2.34 1.88 1.53 1.57 1.76   Cardiac Enzymes: No results for input(s): CKTOTAL, CKMB, CKMBINDEX, TROPONINI in the last 168 hours. BNP (last 3 results) No results for input(s): PROBNP in the last 8760 hours. HbA1C: No results for input(s): HGBA1C in the last 72 hours. CBG: Recent Labs  Lab 08/25/17 1601 08/25/17 1647 08/25/17 1707 08/25/17 1946 08/27/17 0540  GLUCAP 63* 66 113* 94 72   Lipid Profile: No results for input(s): CHOL, HDL, LDLCALC, TRIG, CHOLHDL, LDLDIRECT in the last 72 hours. Thyroid Function Tests: No results for input(s): TSH,  T4TOTAL, FREET4, T3FREE, THYROIDAB in the last 72 hours. Anemia Panel: No results for input(s): VITAMINB12, FOLATE, FERRITIN, TIBC, IRON, RETICCTPCT in the last 72 hours. Urine analysis: No results found for: COLORURINE, APPEARANCEUR, LABSPEC, PHURINE, GLUCOSEU, HGBUR, BILIRUBINUR, KETONESUR, PROTEINUR, UROBILINOGEN, NITRITE, LEUKOCYTESUR Sepsis Labs: @LABRCNTIP (procalcitonin:4,lacticidven:4) ) Recent Results (from the past 240 hour(s))  Blood culture (routine x 2)     Status: Abnormal   Collection Time: 08/17/17  9:22 AM  Result Value Ref Range Status   Specimen Description   Final    BLOOD RIGHT FOREARM Performed at [facility] [address] [place], [place] [address]    Special Requests   Final    BOTTLES DRAWN AEROBIC AND ANAEROBIC Blood Culture adequate volume Performed at [facility] [address], [place], [place] [address]    Culture  Setup Time   Final    GRAM POSITIVE COCCI IN BOTH AEROBIC AND ANAEROBIC BOTTLES Organism ID to follow CRITICAL RESULT CALLED TO, READ BACK BY AND VERIFIED WITH: A MASTERS PHARMD 08/18/17 0009 JDW Performed at [facility], [place] [address], [place], [place] [address]    Culture STAPHYLOCOCCUS AUREUS (A)  Final   Report Status 08/20/2017 FINAL  Final   Organism ID, Bacteria STAPHYLOCOCCUS AUREUS  Final      Susceptibility   Staphylococcus aureus - MIC*    CIPROFLOXACIN <=0.5 SENSITIVE Sensitive     ERYTHROMYCIN >=8 RESISTANT Resistant     GENTAMICIN <=0.5 SENSITIVE Sensitive     OXACILLIN 0.5 SENSITIVE Sensitive     TETRACYCLINE <=1 SENSITIVE  Sensitive     VANCOMYCIN <=0.5 SENSITIVE Sensitive     TRIMETH/SULFA <=10 SENSITIVE Sensitive     CLINDAMYCIN <=0.25 SENSITIVE Sensitive     RIFAMPIN <=0.5 SENSITIVE Sensitive     Inducible Clindamycin NEGATIVE Sensitive     * STAPHYLOCOCCUS AUREUS  Blood Culture ID Panel (Reflexed)     Status: Abnormal   Collection Time: 08/17/17  9:22 AM  Result  Value Ref Range Status   Enterococcus species NOT DETECTED NOT DETECTED Final   Listeria monocytogenes NOT DETECTED NOT DETECTED Final   Staphylococcus species DETECTED (A) NOT DETECTED Final    Comment: CRITICAL RESULT CALLED TO, READ BACK BY AND VERIFIED WITH: A MASTERS PHARMD 08/18/17 0009 JDW    Staphylococcus aureus DETECTED (A) NOT DETECTED Final    Comment: Methicillin (oxacillin) susceptible Staphylococcus aureus (MSSA). Preferred therapy is anti staphylococcal beta lactam antibiotic (Cefazolin or Nafcillin), unless clinically contraindicated. CRITICAL RESULT CALLED TO, READ BACK BY AND VERIFIED WITH: A MASTERS PHARMD 08/18/17 0009 JDW    Methicillin resistance NOT DETECTED NOT DETECTED Final   Streptococcus species NOT DETECTED NOT DETECTED Final   Streptococcus agalactiae NOT DETECTED NOT DETECTED Final   Streptococcus pneumoniae NOT DETECTED NOT DETECTED Final   Streptococcus pyogenes NOT DETECTED NOT DETECTED Final   Acinetobacter baumannii NOT DETECTED NOT DETECTED Final   Enterobacteriaceae species NOT DETECTED NOT DETECTED Final   Enterobacter cloacae complex NOT DETECTED NOT DETECTED Final   Escherichia coli NOT DETECTED NOT DETECTED Final   Klebsiella oxytoca NOT DETECTED NOT DETECTED Final   Klebsiella pneumoniae NOT DETECTED NOT DETECTED Final   Proteus species NOT DETECTED NOT DETECTED Final   Serratia marcescens NOT DETECTED NOT DETECTED Final   Haemophilus influenzae NOT DETECTED NOT DETECTED Final   Neisseria meningitidis NOT DETECTED NOT DETECTED Final   Pseudomonas aeruginosa NOT DETECTED NOT DETECTED Final   Candida albicans NOT DETECTED NOT DETECTED Final   Candida glabrata NOT DETECTED NOT DETECTED Final   Candida krusei NOT DETECTED NOT DETECTED Final   Candida parapsilosis NOT DETECTED NOT DETECTED Final   Candida tropicalis NOT DETECTED NOT DETECTED Final    Comment: Performed at [facility], [place]. [address] [place], [place] [address]  Blood culture  (routine x 2)     Status: Abnormal   Collection Time: 08/17/17  9:52 AM  Result Value Ref Range Status   Specimen Description   Final    BLOOD RIGHT FOREARM Performed at [facility] [address] [place], [place] [address]    Special Requests   Final    BOTTLES DRAWN AEROBIC AND ANAEROBIC Blood Culture adequate volume Performed at [facility] [address], [place], [place] [address]    Culture  Setup Time   Final    GRAM POSITIVE COCCI IN BOTH AEROBIC AND ANAEROBIC BOTTLES CRITICAL RESULT CALLED TO, READ BACK BY AND VERIFIED WITH: A MASTERS PHARMD 08/18/17 00009 JDW    Culture (A)  Final    STAPHYLOCOCCUS AUREUS SUSCEPTIBILITIES PERFORMED ON PREVIOUS CULTURE WITHIN THE LAST 5 DAYS. Performed at [facility], [place] [address] [place], [place] [address]    Report Status 08/20/2017 FINAL  Final  MRSA PCR Screening     Status: None   Collection Time: 08/17/17  8:40 PM  Result Value Ref Range Status   MRSA by PCR NEGATIVE NEGATIVE Final    Comment:        The GeneXpert MRSA Assay (FDA approved for NASAL specimens  only), is one component of a comprehensive MRSA colonization surveillance program. It is not intended to diagnose MRSA infection nor to guide or monitor treatment for MRSA infections. Performed at [facility], [place] [address] [place], [place] [address]   Culture, blood (Routine X 2) w Reflex to ID Panel     Status: None   Collection Time: 08/18/17  3:05 AM  Result Value Ref Range Status   Specimen Description BLOOD RIGHT HAND  Final   Special Requests   Final    BOTTLES DRAWN AEROBIC ONLY Blood Culture adequate volume   Culture   Final    NO GROWTH 5 DAYS Performed at [facility], [place] [address] [place], [place] [address]    Report Status 08/23/2017 FINAL  Final  Culture, blood (Routine X 2) w Reflex to ID Panel     Status: None   Collection Time: 08/18/17  3:05 AM  Result Value Ref Range Status    Specimen Description BLOOD RIGHT HAND  Final   Special Requests   Final    BOTTLES DRAWN AEROBIC ONLY Blood Culture adequate volume   Culture   Final    NO GROWTH 5 DAYS Performed at [facility], [place] [address] [place], [place] [address]    Report Status 08/23/2017 FINAL  Final  Culture, sputum-assessment     Status: None   Collection Time: 08/18/17  9:32 PM  Result Value Ref Range Status   Specimen Description EXPECTORATED SPUTUM  Final   Special Requests NONE  Final   Sputum evaluation   Final    RESULT CALLED TO, READ BACK BY AND VERIFIED WITH: Miguel Dibble RN [id number] 08/18/17 A BROWNING Performed at [facility], [place] [address] [place], [place] [address]    Report Status 08/18/2017 FINAL  Final  Surgical PCR screen     Status: Abnormal   Collection Time: 08/20/17 10:11 AM  Result Value Ref Range Status   MRSA, PCR NEGATIVE NEGATIVE Final   Staphylococcus aureus POSITIVE (A) NEGATIVE Final    Comment: (NOTE) The Xpert SA Assay (FDA approved for NASAL specimens in patients 51 years of age and older), is one component of a comprehensive surveillance program. It is not intended to diagnose infection nor to guide or monitor treatment. Performed at [facility], [place] [address], [place], [place] [address]   Aerobic/Anaerobic Culture (surgical/deep wound)     Status: None (Preliminary result)   Collection Time: 08/24/17  2:06 PM  Result Value Ref Range Status   Specimen Description WOUND  Final   Special Requests RUTURED AV FISTULA  Final   Gram Stain   Final    NO WBC SEEN NO ORGANISMS SEEN Performed at [facility], [place] [address], [place], [place] [address]    Culture   Final    RARE STAPHYLOCOCCUS AUREUS NO ANAEROBES ISOLATED; CULTURE IN PROGRESS FOR 5 DAYS    Report Status PENDING  Incomplete  Aerobic/Anaerobic Culture (surgical/deep wound)     Status: None (Preliminary result)   Collection Time: 08/24/17  2:12 PM  Result Value Ref Range Status    Specimen Description WOUND UPPER ARM PROXIMAL FISTULA  Final   Special Requests SPEC B ON SWABS  Final   Gram Stain   Final    FEW WBC PRESENT, PREDOMINANTLY PMN NO ORGANISMS SEEN Performed at [facility], [place] [address], [place], [place] [address]    Culture   Final  FEW STAPHYLOCOCCUS AUREUS NO ANAEROBES ISOLATED; CULTURE IN PROGRESS FOR 5 DAYS    Report Status PENDING  Incomplete   Organism ID, Bacteria STAPHYLOCOCCUS AUREUS  Final      Susceptibility   Staphylococcus aureus - MIC*    CIPROFLOXACIN <=0.5 SENSITIVE Sensitive     ERYTHROMYCIN >=8 RESISTANT Resistant     GENTAMICIN <=0.5 SENSITIVE Sensitive     OXACILLIN 0.5 SENSITIVE Sensitive     TETRACYCLINE <=1 SENSITIVE Sensitive     VANCOMYCIN <=0.5 SENSITIVE Sensitive     TRIMETH/SULFA <=10 SENSITIVE Sensitive     CLINDAMYCIN <=0.25 SENSITIVE Sensitive     RIFAMPIN <=0.5 SENSITIVE Sensitive     Inducible Clindamycin NEGATIVE Sensitive     * FEW STAPHYLOCOCCUS AUREUS         Radiology Studies: No results found.    Scheduled Meds: . ambrisentan  10 mg Oral Daily  . amiodarone  200 mg Oral QPM  . atorvastatin  40 mg Oral QPM  . calcitRIOL  0.25 mcg Oral Daily  . calcium acetate  1,334 mg Oral TID WC  . cinacalcet  90 mg Oral QPM  . darbepoetin (ARANESP) injection - DIALYSIS  150 mcg Intravenous Q Sat-HD  . levothyroxine  300 mcg Oral QAC breakfast  . mouth rinse  15 mL Mouth Rinse BID  . midodrine  10 mg Oral 3 times per day  . mupirocin ointment  1 application Nasal BID  . Warfarin - Pharmacist Dosing Inpatient   Does not apply q1800   Continuous Infusions: .  ceFAZolin (ANCEF) IV Stopped (08/26/17 1717)     LOS: 10 days    Time spent in minutes: [place], MD [facility] [phone] [email or web address] Password [facility] 08/27/2017, 9:13 AM

## 2017-08-27 NOTE — Progress Notes (Addendum)
  Progress Note    08/27/2017 9:31 AM 3 Days Post-Op  Subjective:  No complaints  afebrile  Vitals:   08/27/17 0330 08/27/17 0712  BP: (!) 89/53 93/70  Pulse: 75 78  Resp: 19 16  Temp: 97.6 F (36.4 C) 97.8 F (36.6 C)  SpO2: 94% 97%    Physical Exam: General:  No distress Lungs:  Non labored Incisions:       CBC    Component Value Date/Time   WBC 5.8 08/26/2017 0155   RBC 3.01 (L) 08/26/2017 0155   HGB 8.4 (L) 08/26/2017 0155   HCT 27.3 (L) 08/26/2017 0155   PLT 142 (L) 08/26/2017 0155   MCV 90.7 08/26/2017 0155   MCH 27.9 08/26/2017 0155   MCHC 30.8 08/26/2017 0155   RDW 19.2 (H) 08/26/2017 0155   LYMPHSABS 0.5 (L) 08/17/2017 0902   MONOABS 0.4 08/17/2017 0902   EOSABS 0.0 08/17/2017 0902   BASOSABS 0.0 08/17/2017 0902    BMET    Component Value Date/Time   NA 135 08/27/2017 0209   K 3.6 08/27/2017 0209   CL 98 (L) 08/27/2017 0209   CO2 24 08/27/2017 0209   GLUCOSE 67 08/27/2017 0209   BUN 20 08/27/2017 0209   CREATININE 6.15 (H) 08/27/2017 0209   CREATININE 4.91 (H) 08/13/2015 1223   CALCIUM 7.3 (L) 08/27/2017 0209   GFRNONAA 9 (L) 08/27/2017 0209   GFRNONAA 13 (L) 07/27/2014 1539   GFRAA 11 (L) 08/27/2017 0209   GFRAA 15 (L) 07/27/2014 1539    INR    Component Value Date/Time   INR 1.76 08/27/2017 0209     Intake/Output Summary (Last 24 hours) at 08/27/2017 0931 Last data filed at 08/26/2017 1700 Gross per 24 hour  Intake 240 ml  Output 1500 ml  Net -1260 ml     Assessment:  57 y.o. male is s/p:  1. Cannulation of rightcommon femoral arteryunder ultrasound guidance 2. Placement of right femoral arterial line 3. Incision and drainage of left upper arm abscess 4. Excision of distal half of leftbrachiocephalic arteriovenous fistulawith stent included 5. Vein patch angioplasty of left brachial artery 6. Cannulation of left femoral vein under ultrasound guidance 7. Placement of left femoraltunneled dialysis catheter    3  Days Post-Op  Plan: -pt's wound looks good today and ready for wound vac-will ask WOC to place vac and will need to dc with vac -f/u with Dr. Bridgett Larsson in ~3 weeks - our office will arrange. -continue IV abx per ID    Leontine Locket, PA-C [facility] [phone] 08/27/2017 9:31 AM  Addendum  I have independently interviewed and examined the patient, and I agree with the physician assistant's findings.  Ok to start VAC to Left arm.  Femoral TDC was placed during case as temporary measure given patient's shocky status intraop.  Pt being discharged today.  Would hold coumadin and plan on placement of internal jugular vein tunneled dialysis catheter on Tuesday 23 APR 19.   Adele Barthel, MD, FACS [facility] Office: [phone] Pager: [phone]  08/27/2017, 10:36 AM

## 2017-08-27 NOTE — Progress Notes (Addendum)
[facility] Progress Note   Subjective:   Feeling ok today.  Pain well controlled.  States someone told him this AM he could have catheter moved to his chest, he wants to have this done. Discussed transitioning to 3d/wk since he will be discharged to in center while receiving antibiotics.  Objective Vitals:   08/26/17 2324 08/27/17 0330 08/27/17 0538 08/27/17 0712  BP: (!) 94/41 (!) 89/53  93/70  Pulse: 80 75  78  Resp: 20 19  16   Temp: 98.4 F (36.9 C) 97.6 F (36.4 C)  97.8 F (36.6 C)  TempSrc: Oral Oral  Oral  SpO2: 91% 94%  97%  Weight:   125.2 kg (276 lb 0.3 oz)   Height:       Physical Exam General:NAD, chronically ill appearing male Heart:RRR, +7/9 systolic murmur Lungs:CTAB anteriorly, nml WOB in 2L [place] Abdomen:soft, NTND, +BS Extremities:no LE edema, RUE wrapped Dialysis Access: R thigh TDC, c/d/i   [facility] Weights   08/26/17 1220 08/26/17 1530 08/27/17 0538  Weight: 127 kg (279 lb 15.8 oz) 125.5 kg (276 lb 10.8 oz) 125.2 kg (276 lb 0.3 oz)    Intake/Output Summary (Last 24 hours) at 08/27/2017 0956 Last data filed at 08/26/2017 1700 Gross per 24 hour  Intake 240 ml  Output 1500 ml  Net -1260 ml    Additional Objective Labs: Basic Metabolic Panel: Recent Labs  Lab 08/25/17 0420 08/26/17 0155 08/27/17 0209  NA 135 135 135  K 4.0 4.1 3.6  CL 99* 98* 98*  CO2 20* 24 24  GLUCOSE 81 80 67  BUN 40* 29* 20  CREATININE 9.22* 7.48* 6.15*  CALCIUM 7.6* 7.5* 7.3*  PHOS 3.5 3.8 3.6   Liver Function Tests: Recent Labs  Lab 08/25/17 0420 08/26/17 0155 08/27/17 0209  AST  --  14*  --   ALT  --  6*  --   ALKPHOS  --  69  --   BILITOT  --  0.6  --   PROT  --  6.4*  --   ALBUMIN 3.0* 2.8* 2.8*   CBC: Recent Labs  Lab 08/21/17 0432 08/22/17 0656  08/24/17 1654 08/25/17 0420 08/26/17 0155  WBC 4.7 6.2  --  7.4 4.8 5.8  HGB 8.1* 8.2*   < > 8.7* 8.7* 8.4*  HCT 25.2* 25.8*   < > 27.3* 27.4* 27.3*  MCV 91.0 89.3  --  89.2 88.4 90.7   PLT 78* 101*  --  136* 118* 142*   < > = values in this interval not displayed.   Blood Culture    Component Value Date/Time   SDES WOUND UPPER ARM PROXIMAL FISTULA 08/24/2017 1412   SPECREQUEST SPEC B ON SWABS 08/24/2017 1412   CULT  08/24/2017 1412    FEW STAPHYLOCOCCUS AUREUS NO ANAEROBES ISOLATED; CULTURE IN PROGRESS FOR 5 DAYS    REPTSTATUS PENDING 08/24/2017 1412   CBG: Recent Labs  Lab 08/25/17 1601 08/25/17 1647 08/25/17 1707 08/25/17 1946 08/27/17 0540  GLUCAP 63* 66 113* 94 72    Lab Results  Component Value Date   INR 1.76 08/27/2017   INR 1.57 08/26/2017   INR 1.53 08/25/2017   Studies/Results: No results found.  Medications: .  ceFAZolin (ANCEF) IV Stopped (08/26/17 1717)   . ambrisentan  10 mg Oral Daily  . amiodarone  200 mg Oral QPM  . atorvastatin  40 mg Oral QPM  . calcitRIOL  0.25 mcg Oral Daily  . calcium acetate  1,334  mg Oral TID WC  . cinacalcet  90 mg Oral QPM  . darbepoetin (ARANESP) injection - DIALYSIS  150 mcg Intravenous Q Sat-HD  . levothyroxine  300 mcg Oral QAC breakfast  . mouth rinse  15 mL Mouth Rinse BID  . midodrine  10 mg Oral 3 times per day  . mupirocin ointment  1 application Nasal BID  . Warfarin - Pharmacist Dosing Inpatient   Does not apply q1800    Dialysis Orders: Home Hemo - 3hrs, 5 day/wk, EDW ~125kg, no heparin (on coumadin at home)  Brief Summary: 57 year old BM with multiple medical problems including ESRD- Presenting with fever and shortness of breath with elevated white count and low blood pressure  Assessment/Plan: 1. MSSA bacteremia -improving,ruptured AVF w/pseudoaneurysm and proximal pus pocket w/necrotic tissue s/p excision of distal half of L BC AVF/stent and I&D abscess by Dr. Bridgett Larsson on 4/16.s/p ICD removal on 4/12d/t suspected source of infection. [facility] 4/9 NGTD. Per ID to received 6 weeks of ancef w/HD.   2. ESRD - on Home HD 3hrs 5d/wk. K 3.6.HD yesterday tolerated well, net UF removed 1.5L.  Plan to skip today and again Friday to get on a 3d week schedule. Will be discharged to inpatient center to receive IV ABX.  Likely will need to increase treatment time. Spoke with [facility] & they are looking into if [facility] can be moved to chest.  3. Anemia of CKD- Hgb 8.4, cont aranesp qwk. 4. Secondary hyperparathyroidism -Ca and P in goal.Continue binders, VDRA and sensipar.  5. HTN/volume - Chronic hypotension, on midodrine.Avoid fluid bolus for low BP.  Likely has loss weight, may need lower EDW with d/c. Plan for continued volume removal with HD. 6. Nutrition - Alb 2.8. Renal diet w/Fluid restrictions. Nepro. Renavite. 7. AFib - back on coumadin, INR 1.5 today 8.  Pulm HTN - on Letairis and Tadalafil, f/b pulm clinic    Jen Mow, PA-C [facility] Pager: [phone] 08/27/2017,9:56 AM  LOS: 10 days   Pt seen, examined and agree w A/P as above.  Kelly Splinter MD [employer] pager [phone]   08/27/2017, 11:59 AM

## 2017-08-27 NOTE — Progress Notes (Signed)
Patients coumadin held tonight per verbal order per Dr. Bridgett Larsson.

## 2017-08-27 NOTE — Consult Note (Addendum)
[facility] Nurse wound consult note Reason for Consult:Application of NPWT (VAC) therapy.  To discharge home with [facility] and they will switch to [facility] at discharge.   Wound type:infectious - HD fistula/graft infected and removed.  Pressure Injury POA: NA Measurement:8 cm x 4 cm x 2 cm  Staples present at proximal end and separate staple line at distal segment of wound.  Wound bed: Ruddy red Drainage (amount, consistency, odor) minimal serosanguinous  No odor Periwound:staples to proximal end.  Dressing procedure/placement/frequency:Cleanse wound with NS and pat dry.  Fill wound bed with NS.  Protect staple line with silicone contact layer.  1 piece black foam.  Change Tues/Thurs/Sat.  [facility] team will follow through discharge.  Domenic Moras RN BSN [place] Pager [phone]

## 2017-08-27 NOTE — Care Management Note (Addendum)
Case Management Note [name] RN, BSN Unit 4E-Case Manager [phone]  Patient Details  Name: Adam [name]. MRN: [id number] Date of Birth: [birth year]  Subjective/Objective:  Pt admitted with Fever, positive for MSSA with sepsis, ICD infection s/p removal-                  Action/Plan: PTA pt lived at home with wife- hx of ESRD with home HD m-f- per ID notes pt will need 6 wks of IV abx- pt will need orders for IV abx and set up with [facility] prior to discharge to assist with home IV abx needs- CM to follow, Life Vest has already been arranged and is at the bedside for discharge.   Expected Discharge Date:  (unknown)               Expected Discharge Plan:  [place]  In-House Referral:  NA  Discharge planning Services  CM Consult  Post Acute Care Choice:  Home Health, Durable Medical Equipment Choice offered to:  Patient, Spouse  DME Arranged:  Life vest DME Agency:  [facility]  HH Arranged:  IV Antibiotics HH Agency:     Status of Service:  In process, will continue to follow  If discussed at Long Length of Stay Meetings, dates discussed:    Discharge Disposition:   Additional Comments: 08/27/2017  CM informed that pt will in fact transition to a HD center while he needs IV antibiotics - Renal will ensure pt is clipped prior to discharge and renal service will facilitate IV antibiotics administration during outpt HD treatments.  Pt remains in agreement for [facility] to provide wound vac and HHRN for wound care/vac management.  [facility] will provide [facility] for wound management Sat, Tues,and Thursday.  Pt will dialyze at outpt HD center Mon, Wed and Friday. Life vest remains at bedside  CM requested DME order for wound vac, HHRN and face to face order, IV antibiotic script for [facility] infusion  08/26/17 Pt now in [place].  Pt will have wound vac at discharge - per surgery pt can use [facility] for wound vac.  Referral given to agency - form placed on shadow chart and needs to be  completed.  CM informed surgery PA that form is on chart.  [facility] already following pt for IV infusion (antibiotic and HHRN). Maryclare Labrador, RN 08/27/2017, 1:35 PM

## 2017-08-27 NOTE — Progress Notes (Signed)
Subjective  - POD #3  Feels ok today   Physical Exam:  Dressing intact over wound Left Hand warm without issues       Assessment/Plan:  POD #3  Dressing to be changed later toady.  Possible wound vac placement Will need outpatient eval for new access  Adam [name] 08/27/2017 8:19 AM --  Vitals:   08/26/17 2324 08/27/17 0330  BP: (!) 94/41 (!) 89/53  Pulse: 80 75  Resp: 20 19  Temp: 98.4 F (36.9 C) 97.6 F (36.4 C)  SpO2: 91% 94%    Intake/Output Summary (Last 24 hours) at 08/27/2017 0819 Last data filed at 08/26/2017 1700 Gross per 24 hour  Intake 240 ml  Output 1500 ml  Net -1260 ml     Laboratory CBC    Component Value Date/Time   WBC 5.8 08/26/2017 0155   HGB 8.4 (L) 08/26/2017 0155   HCT 27.3 (L) 08/26/2017 0155   PLT 142 (L) 08/26/2017 0155    BMET    Component Value Date/Time   NA 135 08/27/2017 0209   K 3.6 08/27/2017 0209   CL 98 (L) 08/27/2017 0209   CO2 24 08/27/2017 0209   GLUCOSE 67 08/27/2017 0209   BUN 20 08/27/2017 0209   CREATININE 6.15 (H) 08/27/2017 0209   CREATININE 4.91 (H) 08/13/2015 1223   CALCIUM 7.3 (L) 08/27/2017 0209   GFRNONAA 9 (L) 08/27/2017 0209   GFRNONAA 13 (L) 07/27/2014 1539   GFRAA 11 (L) 08/27/2017 0209   GFRAA 15 (L) 07/27/2014 1539    COAG Lab Results  Component Value Date   INR 1.76 08/27/2017   INR 1.57 08/26/2017   INR 1.53 08/25/2017   No results found for: PTT  Antibiotics Anti-infectives (From admission, onward)   Start     Dose/Rate Route Frequency Ordered Stop   08/24/17 1230  ceFAZolin (ANCEF) 3,000 mg in dextrose 5 % 100 mL IVPB     3,000 mg 260 mL/hr over 30 Minutes Intravenous To Surgery 08/24/17 1151 08/24/17 1404   08/24/17 1148  ceFAZolin (ANCEF) 2-4 GM/100ML-% IVPB    Note to Pharmacy:  Debbe Bales, [place]: cabinet override      08/24/17 1148 08/24/17 1720   08/20/17 1300  gentamicin (GARAMYCIN) 80 mg in sodium chloride 0.9 % 500 mL irrigation     80 mg Irrigation To  [facility]Stay Surgical 08/19/17 1351 08/20/17 1511   08/20/17 1200  ceFAZolin (ANCEF) 3 g in dextrose 5 % 50 mL IVPB  Status:  Discontinued     3 g 130 mL/hr over 30 Minutes Intravenous To [facility]Stay Surgical 08/19/17 1351 08/20/17 1954   08/18/17 1800  ceFAZolin (ANCEF) IVPB 1 g/50 mL premix     1 g 100 mL/hr over 30 Minutes Intravenous Every 24 hours 08/18/17 0035     08/18/17 1000  cefTRIAXone (ROCEPHIN) 1 g in sodium chloride 0.9 % 100 mL IVPB  Status:  Discontinued     1 g 200 mL/hr over 30 Minutes Intravenous Every 24 hours 08/17/17 1151 08/18/17 0035   08/18/17 0100  ceFAZolin (ANCEF) IVPB 2g/100 mL premix     2 g 200 mL/hr over 30 Minutes Intravenous  Once 08/18/17 0035 08/18/17 0127   08/17/17 1600  azithromycin (ZITHROMAX) tablet 500 mg  Status:  Discontinued     500 mg Oral Daily 08/17/17 1505 08/18/17 1053   08/17/17 1200  vancomycin (VANCOCIN) 2,000 mg in sodium chloride 0.9 % 500 mL IVPB     2,000 mg 250  mL/hr over 120 Minutes Intravenous  Once 08/17/17 1144 08/17/17 1526   08/17/17 1115  cefTRIAXone (ROCEPHIN) 1 g in sodium chloride 0.9 % 100 mL IVPB     1 g 200 mL/hr over 30 Minutes Intravenous  Once 08/17/17 1111 08/17/17 1313   08/17/17 1115  azithromycin (ZITHROMAX) tablet 500 mg     500 mg Oral  Once 08/17/17 1111 08/17/17 1139       V. Leia Alf, M.D. [facility] Office: [phone] Pager:  [phone]

## 2017-08-27 NOTE — Progress Notes (Signed)
ANTICOAGULATION CONSULT NOTE - Follow Up Consult  Pharmacy Consult for warfarin (Afib)   Allergies  Allergen Reactions  . Enalaprilat Swelling    Mouth swelling  . Vasotec [Enalapril] Swelling    MOUTH SWELLING  . Ivp Dye [Iodinated Diagnostic Agents] Nausea And Vomiting    Patient Measurements: Height: 6\' 1"  (185.4 cm) Weight: 276 lb 0.3 oz (125.2 kg) IBW/kg (Calculated) : 79.9 Heparin Dosing Weight: 106.7 kg  Vital Signs: Temp: 97.8 F (36.6 C) (04/18 0712) Temp Source: Oral (04/18 0712) BP: 93/70 (04/18 0712) Pulse Rate: 78 (04/18 0712)  Labs: Recent Labs    08/24/17 1654 08/25/17 0420 08/26/17 0155 08/27/17 0209  HGB 8.7* 8.7* 8.4*  --   HCT 27.3* 27.4* 27.3*  --   PLT 136* 118* 142*  --   LABPROT  --  18.2* 18.6* 20.4*  INR  --  1.53 1.57 1.76  CREATININE 8.62* 9.22* 7.48* 6.15*    Estimated Creatinine Clearance: 19 mL/min (A) (by C-G formula based on SCr of 6.15 mg/dL (H)).   Medications:  Scheduled:  . ambrisentan  10 mg Oral Daily  . amiodarone  200 mg Oral QPM  . atorvastatin  40 mg Oral QPM  . calcitRIOL  0.25 mcg Oral Daily  . calcium acetate  1,334 mg Oral TID WC  . cinacalcet  90 mg Oral QPM  . darbepoetin (ARANESP) injection - DIALYSIS  150 mcg Intravenous Q Sat-HD  . levothyroxine  300 mcg Oral QAC breakfast  . mouth rinse  15 mL Mouth Rinse BID  . midodrine  10 mg Oral 3 times per day  . mupirocin ointment  1 application Nasal BID  . Warfarin - Pharmacist Dosing Inpatient   Does not apply q1800   Infusions:  .  ceFAZolin (ANCEF) IV Stopped (08/26/17 1717)    Assessment/Plan Patient is on warfarin PTA for Afib, INR up to 4.93 this admission and given vitamin K 5 mg on 4/10. Infected ICD was removed and also s/p &D of fistula on 4/15 and placement of TDC.  Warfarin has been restarted -INR= 1.76 (trend up  Goal of Therapy:  INR 2-3 Monitor platelets by anticoagulation protocol: Yes   Plan: Warfarin 6mg  tonight Continue daily  INR  Hildred Laser, PharmD Clinical Pharmacist Clinical phone from 8:30-4:00 is [phone] After 4pm, please call Main Rx [phone]) for assistance. 08/27/2017 10:38 AM

## 2017-08-27 NOTE — Discharge Instructions (Signed)
R chest wound  Keep incision clean and dry, no showering until seen at wound check visit and cleared to Call the office ([phone]) for redness, drainage, swelling.  Wound vac change with Home health RN on T/T/S.  Hold coumadin for catheter change on Tuesday.

## 2017-08-28 ENCOUNTER — Encounter (HOSPITAL_COMMUNITY): Payer: Self-pay | Admitting: *Deleted

## 2017-08-28 ENCOUNTER — Other Ambulatory Visit: Payer: Self-pay

## 2017-08-28 LAB — RENAL FUNCTION PANEL
ALBUMIN: 2.8 g/dL — AB (ref 3.5–5.0)
ANION GAP: 14 (ref 5–15)
BUN: 28 mg/dL — AB (ref 6–20)
CALCIUM: 7.1 mg/dL — AB (ref 8.9–10.3)
CO2: 23 mmol/L (ref 22–32)
Chloride: 98 mmol/L — ABNORMAL LOW (ref 101–111)
Creatinine, Ser: 7.87 mg/dL — ABNORMAL HIGH (ref 0.61–1.24)
GFR calc Af Amer: 8 mL/min — ABNORMAL LOW (ref >60)
GFR calc non Af Amer: 7 mL/min — ABNORMAL LOW (ref >60)
GLUCOSE: 81 mg/dL (ref 65–99)
PHOSPHORUS: 4.7 mg/dL — AB (ref 2.5–4.6)
Potassium: 4.1 mmol/L (ref 3.5–5.1)
SODIUM: 135 mmol/L (ref 135–145)

## 2017-08-28 LAB — CBC
HCT: 23.9 % — ABNORMAL LOW (ref 39.0–52.0)
Hemoglobin: 7.6 g/dL — ABNORMAL LOW (ref 13.0–17.0)
MCH: 28.7 pg (ref 26.0–34.0)
MCHC: 31.8 g/dL (ref 30.0–36.0)
MCV: 90.2 fL (ref 78.0–100.0)
PLATELETS: 103 10*3/uL — AB (ref 150–400)
RBC: 2.65 MIL/uL — ABNORMAL LOW (ref 4.22–5.81)
RDW: 19.5 % — AB (ref 11.5–15.5)
WBC: 4 10*3/uL (ref 4.0–10.5)

## 2017-08-28 LAB — PROTIME-INR
INR: 2.1
Prothrombin Time: 23.4 seconds — ABNORMAL HIGH (ref 11.4–15.2)

## 2017-08-28 MED ORDER — CEFAZOLIN IV (FOR PTA / DISCHARGE USE ONLY): 2.0000 g | INTRAVENOUS | 0 refills | Status: DC | PRN

## 2017-08-28 MED ORDER — CEFAZOLIN IV (FOR PTA / DISCHARGE USE ONLY): 2.0000 g | INTRAVENOUS | 0 refills | Status: AC | PRN

## 2017-08-28 MED ORDER — WARFARIN SODIUM 6 MG PO TABS: 6.0000 mg | ORAL_TABLET | Freq: Once | ORAL | 0 refills | Status: DC

## 2017-08-28 MED ORDER — MIDODRINE HCL 5 MG PO TABS
ORAL_TABLET | ORAL | Status: AC
  Filled 2017-08-28: qty 2

## 2017-08-28 NOTE — Progress Notes (Signed)
[facility] Progress Note   Subjective:  No c/o today, now has wound VAC on L upper arm open wound.  Arm feels much bette.r   Objective Vitals:   08/28/17 0945 08/28/17 1015 08/28/17 1046 08/28/17 1050  BP: 117/75 111/79 (!) 78/63 106/68  Pulse: 69 74 (!) 114 72  Resp:    17  Temp:    97.8 F (36.6 C)  TempSrc:    Oral  SpO2:    95%  Weight:    123.4 kg (272 lb 0.8 oz)  Height:       Physical Exam General:NAD, chronically ill appearing male Heart:RRR, +5/7 systolic murmur Lungs:CTAB anteriorly, nml WOB in 2L [place] Abdomen:soft, NTND, +BS Extremities:no LE edema, LUE w/ wound VAC in place Dialysis Access: R thigh TDC, c/d/i   Filed Weights   08/28/17 0618 08/28/17 0706 08/28/17 1050  Weight: 124.5 kg (274 lb 7.6 oz) 126.3 kg (278 lb 7.1 oz) 123.4 kg (272 lb 0.8 oz)    Intake/Output Summary (Last 24 hours) at 08/28/2017 1151 Last data filed at 08/28/2017 1050 Gross per 24 hour  Intake 360 ml  Output 2008 ml  Net -1648 ml    Additional Objective Labs: Basic Metabolic Panel: Recent Labs  Lab 08/26/17 0155 08/27/17 0209 08/28/17 0330  NA 135 135 135  K 4.1 3.6 4.1  CL 98* 98* 98*  CO2 24 24 23   GLUCOSE 80 67 81  BUN 29* 20 28*  CREATININE 7.48* 6.15* 7.87*  CALCIUM 7.5* 7.3* 7.1*  PHOS 3.8 3.6 4.7*   Liver Function Tests: Recent Labs  Lab 08/26/17 0155 08/27/17 0209 08/28/17 0330  AST 14*  --   --   ALT 6*  --   --   ALKPHOS 69  --   --   BILITOT 0.6  --   --   PROT 6.4*  --   --   ALBUMIN 2.8* 2.8* 2.8*   CBC: Recent Labs  Lab 08/22/17 0656  08/24/17 1654 08/25/17 0420 08/26/17 0155 08/28/17 0732  WBC 6.2  --  7.4 4.8 5.8 4.0  HGB 8.2*   < > 8.7* 8.7* 8.4* 7.6*  HCT 25.8*   < > 27.3* 27.4* 27.3* 23.9*  MCV 89.3  --  89.2 88.4 90.7 90.2  PLT 101*  --  136* 118* 142* 103*   < > = values in this interval not displayed.   Blood Culture    Component Value Date/Time   SDES WOUND UPPER ARM PROXIMAL FISTULA 08/24/2017 1412   SPECREQUEST SPEC B ON SWABS 08/24/2017 1412   CULT  08/24/2017 1412    FEW STAPHYLOCOCCUS AUREUS NO ANAEROBES ISOLATED; CULTURE IN PROGRESS FOR 5 DAYS    REPTSTATUS PENDING 08/24/2017 1412   CBG: Recent Labs  Lab 08/25/17 1601 08/25/17 1647 08/25/17 1707 08/25/17 1946 08/27/17 0540  GLUCAP 63* 66 113* 94 72    Lab Results  Component Value Date   INR 2.10 08/28/2017   INR 1.76 08/27/2017   INR 1.57 08/26/2017   Studies/Results: No results found.  Medications: .  ceFAZolin (ANCEF) IV Stopped (08/27/17 1752)   . ambrisentan  10 mg Oral Daily  . amiodarone  200 mg Oral QPM  . atorvastatin  40 mg Oral QPM  . calcitRIOL  0.25 mcg Oral Daily  . calcium acetate  1,334 mg Oral TID WC  . cinacalcet  90 mg Oral QPM  . darbepoetin (ARANESP) injection - DIALYSIS  150 mcg Intravenous Q Sat-HD  . levothyroxine  300 mcg Oral QAC breakfast  . mouth rinse  15 mL Mouth Rinse BID  . midodrine      . midodrine  10 mg Oral 3 times per day  . mupirocin ointment  1 application Nasal BID  . warfarin  6 mg Oral ONCE-1800  . Warfarin - Pharmacist Dosing Inpatient   Does not apply q1800    Dialysis Orders: Home Hemo - 3hrs, 5 day/wk, EDW ~125kg, no heparin (on coumadin at home)  Brief Summary: 57 year old BM with multiple medical problems including ESRD- Presenting with fever and shortness of breath with elevated white count and low blood pressure  Assessment/Plan: 1. MSSA bacteremia -ICD removed, then had I&D of LUE AVF-related abscess on 4/16 by [facility]. Now w/ wound VAC on LUA wound. Feels much better. To get total 6 weeks of ancef w/HD.   2. ESRD - on Home HD 3hrs 5d/wk. Will be discharged to inpatient center to receive HD 3 times/ week while getting IV abx.  Likely will need to increase treatment time. HD tomorrow.  3. Anemia of CKD- Hgb 8.4, cont aranesp qwk. 4. Secondary hyperparathyroidism -Ca and P in goal.Continue binders, VDRA and sensipar.  5. HTN/volume - Chronic  hypotension, on midodrine.Avoid fluid bolus for low BP.  Likely has loss weight, may need lower EDW with d/c. Plan for continued volume removal with HD. 6. Nutrition - Alb 2.8. Renal diet w/Fluid restrictions. Nepro. Renavite. 7. AFib - back on coumadin, INR 1.5 today 8.  Pulm HTN - on Letairis and Tadalafil, f/b pulm clinic    Kelly Splinter MD [place] pager [phone]   08/28/2017, 11:51 AM

## 2017-08-28 NOTE — Progress Notes (Signed)
Pre-op call completed no further questions from patient.  Last dose of coumadin is 08/26/17.  Patient is currently admitted and is not sure that he is going home over the weekend.

## 2017-08-28 NOTE — Progress Notes (Signed)
   VASCULAR SURGERY ASSESSMENT & PLAN:   The VAC is in place with a good seal.  Okay for discharge from our standpoint.  Follow-up has been arranged with Dr. Bridgett Larsson.  SUBJECTIVE:   No complaints.  PHYSICAL EXAM:   Vitals:   08/28/17 0945 08/28/17 1015 08/28/17 1046 08/28/17 1050  BP: 117/75 111/79 (!) 78/63 106/68  Pulse: 69 74 (!) 114 72  Resp:    17  Temp:    97.8 F (36.6 C)  TempSrc:    Oral  SpO2:    95%  Weight:    272 lb 0.8 oz (123.4 kg)  Height:       The VAC has a good seal.  LABS:   Lab Results  Component Value Date   WBC 4.0 08/28/2017   HGB 7.6 (L) 08/28/2017   HCT 23.9 (L) 08/28/2017   MCV 90.2 08/28/2017   PLT 103 (L) 08/28/2017   Lab Results  Component Value Date   CREATININE 7.87 (H) 08/28/2017   Lab Results  Component Value Date   INR 2.10 08/28/2017   CBG (last 3)  Recent Labs    08/25/17 1707 08/25/17 1946 08/27/17 0540  GLUCAP 113* 94 72    PROBLEM LIST:    Principal Problem:   MSSA bacteremia Active Problems:   Atrial fibrillation [I48.91]   Pulmonary hypertension (HCC)   End-stage renal disease (HCC)   Acute respiratory failure with hypoxia (HCC)   Hypothyroidism   Sepsis (HCC)   Complication of arteriovenous dialysis fistula   Stage 5 chronic kidney disease on chronic dialysis ([place])   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 ambrisentan  10 mg Oral Daily  . amiodarone  200 mg Oral QPM  . atorvastatin  40 mg Oral QPM  . calcitRIOL  0.25 mcg Oral Daily  . calcium acetate  1,334 mg Oral TID WC  . cinacalcet  90 mg Oral QPM  . darbepoetin (ARANESP) injection - DIALYSIS  150 mcg Intravenous Q Sat-HD  . levothyroxine  300 mcg Oral QAC breakfast  . mouth rinse  15 mL Mouth Rinse BID  . midodrine      . midodrine  10 mg Oral 3 times per day  . mupirocin ointment  1 application Nasal BID  . warfarin  6 mg Oral ONCE-1800  . Warfarin - Pharmacist Dosing Inpatient   Does not  apply [place]: [phone] Office: [phone] 08/28/2017

## 2017-08-28 NOTE — Discharge Summary (Addendum)
Physician Discharge Summary  Bennye Alm. OEU:[id number] DOB: 1960/11/23 DOA: 08/17/2017  PCP: [name], MD  Admit date: 08/17/2017  Discharge date: 08/28/2017  Admitted From:Home  Disposition:  Home with home health  Recommendations for Outpatient Follow-up:  1. Follow up with PCP in 1-2 weeks 2. Follow-up with vascular surgery Dr. Bridgett Larsson for wound management as needed.  Home Health:  Equipment/Devices:  Discharge Condition:  CODE STATUS: Full  Diet recommendation: Heart Healthy  Brief/Interim Summary:  Adam [name]. is a 57 y/o with ESRD on home hemodialysis on Monday through Friday for the past year, atrial fibrillation on Coumadin, chronic hypertension, nonischemic cardiomyopathy with AICD, pulmonary hypertension, moderate aortic stenosis He has been feeling well up until one day prior to admission when he developed a fever (102.8) and dyspnea.  Apparently his wife stated that he had felt warm for a few days and may have been having fevers and not realizing it.  He has been doing his dialysis and his fistula has felt fine.  He had a dry cough for 1 week. He was admitted at [facility] in transfer to [facility] for ongoing dialysis.  Blood cultures became positive for MSSA.  Pt subsequently had defibrillator and fistula removed. Currently on antibiotics and perioperative period (fistula removed 4/15).  Plan is to continue Ancef with hemodialysis until 09/28/2017.  He has received hemodialysis on this day of discharge with no complications noted.   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 Sepsis secondary to MSSA bacteremia in setting of AICD.  Continue Ancef with dialysis that is managed by nephrology.  Continue Ancef until 09/28/2017.  AICD has been removed during this admission. 2. Atrial fibrillation.  Continue Coumadin at current dose as well as home amiodarone. 3. Acute and chronic thrombocytopenia.  Currently stable. 4. Severe pulmonary hypertension.  Continue ambrisentan and Tadalafil. 5. Dyslipidemia.  Continue statin. 6. Hypothyroidism.  Continue Synthroid.  Discharge Instructions  Discharge Instructions    Diet - low sodium heart healthy   Complete by:  As directed    Home infusion instructions [facility] May follow [place] Dosing Protocol; May administer Cathflo as needed to maintain patency of vascular access device.; Flushing of vascular access device: per [facility] Protocol: 0.9% NaCl pre/post medica...   Complete by:  As directed    Instructions:  May follow [place] Dosing Protocol   Instructions:  May administer Cathflo as needed to maintain patency of vascular access device.   Instructions:  Flushing of vascular access device: per [facility] Protocol: 0.9% NaCl pre/post medication administration and prn patency; Heparin 100 u/ml, 59m for implanted ports and Heparin 10u/ml, 566mfor all other central venous catheters.   Instructions:  May follow [facility] Anaphylaxis Protocol for First Dose Administration in the home: 0.9% NaCl at 25-50 ml/hr to maintain IV access for protocol meds. Epinephrine 0.3 ml IV/IM PRN and Benadryl 25-50 IV/IM PRN s/s of anaphylaxis.   Instructions:  [employer] Coordinator (RN) to assist per patient IV care needs in the home PRN.   Increase activity slowly   Complete by:  As directed      Allergies as of 08/28/2017      Reactions   Enalaprilat Swelling   Mouth swelling   Vasotec [enalapril] Swelling   MOUTH SWELLING   Ivp Dye [iodinated Diagnostic Agents] Nausea And Vomiting       Medication List    TAKE these medications   ambrisentan 10 MG tablet Commonly known as:  LETAIRIS Take 1 tablet (10 mg total) by mouth daily.   amiodarone 200 MG tablet Commonly known as:  PACERONE Take 200 mg by mouth every evening.   atorvastatin 40 MG tablet Commonly known as:  LIPITOR Take 1 tablet (40 mg total) by mouth every evening.   ceFAZolin IVPB Commonly known as:  ANCEF Inject 2 g into the vein every dialysis (GIVE AT END OF EACH HD SESSION (MWF schedule)). Indication: MSSA Bacteremia Last Day of Therapy: 10/05/17 Labs - Once weekly:  CBC/D and BMP, Labs - Every other week:  ESR and CRP   cinacalcet 90 MG tablet Commonly known as:  SENSIPAR Take 90 mg by mouth every evening.   levothyroxine 300 MCG tablet Commonly known as:  SYNTHROID, LEVOTHROID Take 1 tablet (300 mcg total) by mouth daily.   midodrine 10 MG tablet Commonly known as:  PROAMATINE Take 1 tablet (10 mg total) by mouth 3 (three) times daily.   tadalafil (PAH) 20 MG tablet Commonly known as:  ADCIRCA 1 tab daily   warfarin 6 MG tablet Commonly known as:  COUMADIN Take as directed. If you are unsure how to take this medication, talk to your nurse or doctor. Original instructions:  Take 1 tablet (6 mg total) by mouth one time only at 6 PM. What changed:    how much to take  when to take this  additional instructions            Home Infusion Instuctions  (From admission, onward)        Start     Ordered   08/28/17 0000  Home infusion instructions [facility] May follow [place] Dosing Protocol; May administer Cathflo as needed to maintain patency of vascular access device.; Flushing of vascular access device: per [facility] Protocol: 0.9% NaCl pre/post medica...    Question Answer Comment  Instructions May follow [place] Dosing Protocol   Instructions May administer Cathflo as needed to maintain patency of vascular access device.   Instructions Flushing of vascular  access device: per [facility] Protocol: 0.9% NaCl pre/post medication administration and prn patency; Heparin 100 u/ml, 59m for implanted ports and Heparin 10u/ml, 549mfor all other central venous catheters.   Instructions May follow [facility] Anaphylaxis Protocol for First Dose Administration in the home: 0.9% NaCl at 25-50 ml/hr to maintain IV access for protocol meds. Epinephrine 0.3 ml IV/IM PRN and Benadryl 25-50 IV/IM PRN s/s of anaphylaxis.   Instructions [facility] Infusion Coordinator (RN) to assist per patient IV care needs in the home PRN.      08/28/17 1242     Follow-up Information    [facility] Follow up on 08/31/2017.   Specialty:  Cardiology Why:  2:00PM Contact information: [phone]     CrSanda KleinMD Follow up on 09/18/2017.   Specialty:  Cardiology Why:  10:40AM Contact information: [phone]        DiMarjie SkiffMD Follow up in 2 week(s).  Specialty:  Family Medicine Contact information: [address] [place] [address] [phone]          Allergies  Allergen Reactions  . Enalaprilat Swelling    Mouth swelling  . Vasotec [Enalapril] Swelling    MOUTH SWELLING  . Ivp Dye [Iodinated Diagnostic Agents] Nausea And Vomiting    Consultations:  Nephrology  ID   Procedures/Studies: Dg Chest 2 View  Result Date: 08/21/2017 CLINICAL DATA:  Post op removal of pacemaker yesterday - no chest complaints, just soreness - pt unable to raise right arm for lateral view EXAM: CHEST - 2 VIEW COMPARISON:  Chest x-ray dated 08/17/2017. Chest x-ray dated 11/11/2016. FINDINGS: Status post RIGHT chest wall pacemaker removal. Stable cardiomegaly. Aortic atherosclerosis. Stable chronic interstitial prominence. No new lung findings. No pleural effusion or pneumothorax seen. IMPRESSION: 1. Status post RIGHT chest  wall pacemaker removal. No pleural effusion or pneumothorax seen. 2. Stable cardiomegaly. 3. Stable chronic interstitial prominence, likely chronic mild CHF/volume overload. 4. Aortic atherosclerosis. Electronically Signed   By: Franki Cabot M.D.   On: 08/21/2017 09:06   Dg Chest 2 View  Result Date: 08/17/2017 CLINICAL DATA:  Fever and SOB.  Symptoms for 1 week. EXAM: CHEST - 2 VIEW COMPARISON:  11/11/2016. FINDINGS: Cardiomegaly. Dual lead pacer. Mild vascular congestion without overt edema or consolidation. No effusion or pneumothorax. Vascular stent overlies the LEFT axilla. IMPRESSION: Cardiomegaly with mild vascular congestion but no frank edema or consolidation. Similar appearance to priors. Electronically Signed   By: Staci Righter M.D.   On: 08/17/2017 09:24   Ct Angio Up Extrem Left W &/or Wo Contast  Result Date: 08/26/2017 CLINICAL DATA:  57 year old male with a left upper extremity brachiocephalic arteriovenous fistula and left upper extremity pain. EXAM: CT ANGIOGRAPHY OF THE left upperEXTREMITY TECHNIQUE: Multidetector CT imaging of the left upper extremitywas performed using the standard protocol during bolus administration of intravenous contrast. Multiplanar CT image reconstructions and MIPs were obtained to evaluate the vascular anatomy. CONTRAST:  173m ISOVUE-370 IOPAMIDOL (ISOVUE-370) INJECTION 76% COMPARISON:  Prior radiographs of the left upper extremity 08/18/2017 FINDINGS: Unfortunately, there is no contrast opacification of the vascular structures rendering this CT arteriogram of the left upper extremity extremely limited. The patient was going to return for repeat imaging but had already gone to surgery at that point. Therefore, repeat imaging was deferred. Left upper extremity brachiocephalic arteriovenous fistula. There is a discontinuous and fractured stent in the proximal cephalic vein just above the elbow. Irregular soft tissue density is present surrounding the fractured  stent and extending into the surrounding soft tissues and to the overlying skin. There is associated overlying skin thickening. The fistula wall appears thickened and calcified in the mid arm. Two overlapping stents are present beginning in the axillary vein and extending into the subclavian vein. These stent systems are incompletely imaged. Again, there is evidence of fracture. The left brachial artery demonstrates mild peripheral calcifications. No suspicious adenopathy or soft tissue lesion. Mild degenerative osteoarthritis at the left glenohumeral joint. No acute osseous abnormality. Review of the MIP images confirms the above findings. IMPRESSION: 1. Unfortunately, no contrast opacifies the vessels of the left upper extremity rendering this CT arteriogram extremely limited. 2. There is a frankly fractured stent in the proximal draining cephalic vein of the left upper extremity brachiocephalic fistula just above the elbow. Soft tissue density surrounding the site of stent fracture may represent pseudoaneurysm and/or abscess. There is associated inflammatory stranding in the surrounding soft tissues and thickening  of the overlying skin. 3. Overlapping stents in the left axillary and left subclavian veins are incompletely imaged. There is evidence of stent fracture. 4. Atherosclerotic calcifications in the brachial artery. Electronically Signed   By: Jacqulynn Cadet M.D.   On: 08/26/2017 08:30   Dg Chest Port 1 View  Result Date: 08/25/2017 CLINICAL DATA:  ET tube EXAM: PORTABLE CHEST 1 VIEW COMPARISON:  08/24/2017 FINDINGS: Endotracheal tube is unchanged. Interval placement of NG tube into the stomach. Cardiomegaly. Increasing bilateral perihilar and lower lobe opacities, likely edema. Suspect small layering effusions. IMPRESSION: Worsening bilateral perihilar and lower lobe opacities, likely edema/CHF. Layering effusions. Electronically Signed   By: Rolm Baptise M.D.   On: 08/25/2017 07:49   Portable  Chest X-ray  Result Date: 08/24/2017 CLINICAL DATA:  Endotracheal tube placement EXAM: PORTABLE CHEST 1 VIEW COMPARISON:  08/21/2017 FINDINGS: 1700 hours. Endotracheal tube tip is 5.1 cm above the base of the carina. The cardio pericardial silhouette is enlarged. Inferiorly placed dialysis catheter tip overlies the right atrium. Central parahilar airspace opacity bilaterally suggests edema. Retrocardiac atelectasis or infiltrate evident. Vascular stent device noted in the left axillary region. Telemetry leads overlie the chest. IMPRESSION: 1. Endotracheal tube tip 5.1 cm above the base of the carina. 2. Enlargement of the cardiopericardial silhouette with probable pulmonary edema. 3. Left base atelectasis/infiltrate with potential tiny left pleural effusion. Electronically Signed   By: Misty Stanley M.D.   On: 08/24/2017 17:26   Dg Shoulder Left  Result Date: 08/18/2017 CLINICAL DATA:  Left upper arm pain and bulge in the distal humerus. Fistula inserted in 2003. EXAM: LEFT SHOULDER - 2+ VIEW COMPARISON:  None. FINDINGS: A vascular graft is seen projecting over the left shoulder along the expected location of the distal subclavian and axillary vessels. Osteoarthritis of the glenohumeral joint with joint space narrowing and subchondral sclerosis of the humeral head. AC joint is intact. The adjacent ribs and lung are nonacute. There is moderate aortic atherosclerosis at the arch. IMPRESSION: Osteoarthritis of the glenohumeral joint. No acute osseous abnormality. Overlying vascular stent is in place. Electronically Signed   By: Ashley Royalty M.D.   On: 08/18/2017 19:05   Dg Abd Portable 1v  Result Date: 08/24/2017 CLINICAL DATA:  57 y/o  M; enteric tube placement. EXAM: PORTABLE ABDOMEN - 1 VIEW COMPARISON:  None. FINDINGS: Normal bowel gas pattern. Vascular calcifications. Enteric tube tip projects over gastric body. Mild levocurvature of lumbar spine. IMPRESSION: Enteric tube tip projects over gastric body.  Electronically Signed   By: Kristine Garbe M.D.   On: 08/24/2017 18:18   Dg Humerus Left  Result Date: 08/18/2017 CLINICAL DATA:  Left upper arm pain with small bulge along the distal humerus. EXAM: LEFT HUMERUS - 2+ VIEW COMPARISON:  None. FINDINGS: A vascular stent is seen along the volar aspect of the distal forearm which appears discontinuous for a length of 17 mm posteriorly and 21 mm along its radial aspect. A portion of the stent appears to have peeled away resulting in the gap. Volar bowing of the vascular stent is noted on the lateral view which may be contributing to the patient's small bulge noted. Subclavian and axillary stent is also seen projecting over the shoulder. Atherosclerotic calcifications are identified along the brachial artery. No acute fracture or bone destruction of the humerus. No joint dislocation. Osteoarthritis of the glenohumeral joint with inferomedial spurring off the humeral head. IMPRESSION: 1. Discontinuous vascular stent along the distal arm with volar bowing as above described. There  is a small gap associated with the stent measuring 17 mm posteriorly and 21 mm along its radial aspect with peeling away of a portion of the stent believed to account for this gap. 2. Intact subclavian and axillary stent projecting over the shoulder. 3. No acute osseous abnormality. Electronically Signed   By: Ashley Royalty M.D.   On: 08/18/2017 19:10   Dg C-arm 1-60 Min-no Report  Result Date: 08/20/2017 Fluoroscopy was utilized by the requesting physician.  No radiographic interpretation.   Dg C-arm 1-60 Min-no Report  Result Date: 08/20/2017 Fluoroscopy was utilized by the requesting physician.  No radiographic interpretation.    Discharge Exam: Vitals:   08/28/17 1046 08/28/17 1050  BP: (!) 78/63 106/68  Pulse: (!) 114 72  Resp:  17  Temp:  97.8 F (36.6 C)  SpO2:  95%   Vitals:   08/28/17 0945 08/28/17 1015 08/28/17 1046 08/28/17 1050  BP: 117/75 111/79 (!)  78/63 106/68  Pulse: 69 74 (!) 114 72  Resp:    17  Temp:    97.8 F (36.6 C)  TempSrc:    Oral  SpO2:    95%  Weight:    123.4 kg (272 lb 0.8 oz)  Height:        General: Pt is alert, awake, not in acute distress Cardiovascular: RRR, S1/S2 +, no rubs, no gallops Respiratory: CTA bilaterally, no wheezing, no rhonchi Abdominal: Soft, NT, ND, bowel sounds + Extremities: no edema, no cyanosis    The results of significant diagnostics from this hospitalization (including imaging, microbiology, ancillary and laboratory) are listed below for reference.     Microbiology: Recent Results (from the past 240 hour(s))  Culture, sputum-assessment     Status: None   Collection Time: 08/18/17  9:32 PM  Result Value Ref Range Status   Specimen Description EXPECTORATED SPUTUM  Final   Special Requests NONE  Final   Sputum evaluation   Final    RESULT CALLED TO, READ BACK BY AND VERIFIED WITH: Miguel Dibble RN [id number] 08/18/17 A BROWNING Performed at [facility], [place] [address], [place], [place] [address]    Report Status 08/18/2017 FINAL  Final  Surgical PCR screen     Status: Abnormal   Collection Time: 08/20/17 10:11 AM  Result Value Ref Range Status   MRSA, PCR NEGATIVE NEGATIVE Final   Staphylococcus aureus POSITIVE (A) NEGATIVE Final    Comment: (NOTE) The Xpert SA Assay (FDA approved for NASAL specimens in patients 19 years of age and older), is one component of a comprehensive surveillance program. It is not intended to diagnose infection nor to guide or monitor treatment. Performed at [facility], [place] [address], [place], [place] [address]   Aerobic/Anaerobic Culture (surgical/deep wound)     Status: None (Preliminary result)   Collection Time: 08/24/17  2:06 PM  Result Value Ref Range Status   Specimen Description WOUND  Final   Special Requests RUTURED AV FISTULA  Final   Gram Stain NO WBC SEEN NO ORGANISMS SEEN   Final   Culture   Final    RARE STAPHYLOCOCCUS  AUREUS NO ANAEROBES ISOLATED; CULTURE IN PROGRESS FOR 5 DAYS    Report Status PENDING  Incomplete   Organism ID, Bacteria STAPHYLOCOCCUS AUREUS  Final      Susceptibility   Staphylococcus aureus - MIC*    CIPROFLOXACIN <=0.5 SENSITIVE Sensitive     ERYTHROMYCIN >=8 RESISTANT Resistant     GENTAMICIN <=0.5 SENSITIVE Sensitive  OXACILLIN 0.5 SENSITIVE Sensitive     TETRACYCLINE <=1 SENSITIVE Sensitive     VANCOMYCIN <=0.5 SENSITIVE Sensitive     TRIMETH/SULFA <=10 SENSITIVE Sensitive     CLINDAMYCIN <=0.25 SENSITIVE Sensitive     RIFAMPIN <=0.5 SENSITIVE Sensitive     Inducible Clindamycin Value in next row Sensitive      NEGATIVEPerformed at [facility] [address], [place], [place] [address]    * RARE STAPHYLOCOCCUS AUREUS  Aerobic/Anaerobic Culture (surgical/deep wound)     Status: None (Preliminary result)   Collection Time: 08/24/17  2:12 PM  Result Value Ref Range Status   Specimen Description WOUND UPPER ARM PROXIMAL FISTULA  Final   Special Requests SPEC B ON SWABS  Final   Gram Stain   Final    FEW WBC PRESENT, PREDOMINANTLY PMN NO ORGANISMS SEEN Performed at [facility], [place] [address], [place], [place] [address]    Culture   Final    FEW STAPHYLOCOCCUS AUREUS NO ANAEROBES ISOLATED; CULTURE IN PROGRESS FOR 5 DAYS    Report Status PENDING  Incomplete   Organism ID, Bacteria STAPHYLOCOCCUS AUREUS  Final      Susceptibility   Staphylococcus aureus - MIC*    CIPROFLOXACIN <=0.5 SENSITIVE Sensitive     ERYTHROMYCIN >=8 RESISTANT Resistant     GENTAMICIN <=0.5 SENSITIVE Sensitive     OXACILLIN 0.5 SENSITIVE Sensitive     TETRACYCLINE <=1 SENSITIVE Sensitive     VANCOMYCIN <=0.5 SENSITIVE Sensitive     TRIMETH/SULFA <=10 SENSITIVE Sensitive     CLINDAMYCIN <=0.25 SENSITIVE Sensitive     RIFAMPIN <=0.5 SENSITIVE Sensitive     Inducible Clindamycin NEGATIVE Sensitive     * FEW STAPHYLOCOCCUS AUREUS     Labs: BNP (last 3 results) No results for  input(s): BNP in the last 8760 hours. Basic Metabolic Panel: Recent Labs  Lab 08/24/17 1654 08/25/17 0420 08/26/17 0155 08/27/17 0209 08/28/17 0330  NA 134* 135 135 135 135  K 4.7 4.0 4.1 3.6 4.1  CL 99* 99* 98* 98* 98*  CO2 22 20* '24 24 23  '$ GLUCOSE 91 81 80 67 81  BUN 38* 40* 29* 20 28*  CREATININE 8.62* 9.22* 7.48* 6.15* 7.87*  CALCIUM 7.4* 7.6* 7.5* 7.3* 7.1*  MG  --  1.8 1.8  --   --   PHOS 3.2 3.5 3.8 3.6 4.7*   Liver Function Tests: Recent Labs  Lab 08/24/17 1654 08/25/17 0420 08/26/17 0155 08/27/17 0209 08/28/17 0330  AST  --   --  14*  --   --   ALT  --   --  6*  --   --   ALKPHOS  --   --  69  --   --   BILITOT  --   --  0.6  --   --   PROT  --   --  6.4*  --   --   ALBUMIN 3.0* 3.0* 2.8* 2.8* 2.8*   No results for input(s): LIPASE, AMYLASE in the last 168 hours. No results for input(s): AMMONIA in the last 168 hours. CBC: Recent Labs  Lab 08/22/17 0656  08/24/17 1400 08/24/17 1654 08/25/17 0420 08/26/17 0155 08/28/17 0732  WBC 6.2  --   --  7.4 4.8 5.8 4.0  HGB 8.2*   < > 9.5* 8.7* 8.7* 8.4* 7.6*  HCT 25.8*   < > 28.0* 27.3* 27.4* 27.3* 23.9*  MCV 89.3  --   --  89.2 88.4  90.7 90.2  PLT 101*  --   --  136* 118* 142* 103*   < > = values in this interval not displayed.   Cardiac Enzymes: No results for input(s): CKTOTAL, CKMB, CKMBINDEX, TROPONINI in the last 168 hours. BNP: Invalid input(s): POCBNP CBG: Recent Labs  Lab 08/25/17 1601 08/25/17 1647 08/25/17 1707 08/25/17 1946 08/27/17 0540  GLUCAP 63* 66 113* 94 72   D-Dimer No results for input(s): DDIMER in the last 72 hours. Hgb A1c No results for input(s): HGBA1C in the last 72 hours. Lipid Profile No results for input(s): CHOL, HDL, LDLCALC, TRIG, CHOLHDL, LDLDIRECT in the last 72 hours. Thyroid function studies No results for input(s): TSH, T4TOTAL, T3FREE, THYROIDAB in the last 72 hours.  Invalid input(s): FREET3 Anemia work up No results for input(s): VITAMINB12, FOLATE,  FERRITIN, TIBC, IRON, RETICCTPCT in the last 72 hours. Urinalysis No results found for: COLORURINE, APPEARANCEUR, [place], [place], [place], [place], [place], [place], PROTEINUR, UROBILINOGEN, NITRITE, LEUKOCYTESUR Sepsis Labs Invalid input(s): PROCALCITONIN,  WBC,  LACTICIDVEN Microbiology Recent Results (from the past 240 hour(s))  Culture, sputum-assessment     Status: None   Collection Time: 08/18/17  9:32 PM  Result Value Ref Range Status   Specimen Description EXPECTORATED SPUTUM  Final   Special Requests NONE  Final   Sputum evaluation   Final    RESULT CALLED TO, READ BACK BY AND VERIFIED WITH: Miguel Dibble RN [id number] 08/18/17 A BROWNING Performed at [facility], [address], [place], [place] [address]    Report Status 08/18/2017 FINAL  Final  Surgical PCR screen     Status: Abnormal   Collection Time: 08/20/17 10:11 AM  Result Value Ref Range Status   MRSA, PCR NEGATIVE NEGATIVE Final   Staphylococcus aureus POSITIVE (A) NEGATIVE Final    Comment: (NOTE) The Xpert SA Assay (FDA approved for NASAL specimens in patients 30 years of age and older), is one component of a comprehensive surveillance program. It is not intended to diagnose infection nor to guide or monitor treatment. Performed at [facility], [place] [address] [place], [place] [address]   Aerobic/Anaerobic Culture (surgical/deep wound)     Status: None (Preliminary result)   Collection Time: 08/24/17  2:06 PM  Result Value Ref Range Status   Specimen Description WOUND  Final   Special Requests RUTURED AV FISTULA  Final   Gram Stain NO WBC SEEN NO ORGANISMS SEEN   Final   Culture   Final    RARE STAPHYLOCOCCUS AUREUS NO ANAEROBES ISOLATED; CULTURE IN PROGRESS FOR 5 DAYS    Report Status PENDING  Incomplete   Organism ID, Bacteria STAPHYLOCOCCUS AUREUS  Final      Susceptibility   Staphylococcus aureus - MIC*    CIPROFLOXACIN <=0.5 SENSITIVE Sensitive     ERYTHROMYCIN >=8 RESISTANT  Resistant     GENTAMICIN <=0.5 SENSITIVE Sensitive     OXACILLIN 0.5 SENSITIVE Sensitive     TETRACYCLINE <=1 SENSITIVE Sensitive     VANCOMYCIN <=0.5 SENSITIVE Sensitive     TRIMETH/SULFA <=10 SENSITIVE Sensitive     CLINDAMYCIN <=0.25 SENSITIVE Sensitive     RIFAMPIN <=0.5 SENSITIVE Sensitive     Inducible Clindamycin Value in next row Sensitive      NEGATIVEPerformed at [facility] [address], [place], [place] [address]    * RARE STAPHYLOCOCCUS AUREUS  Aerobic/Anaerobic Culture (surgical/deep wound)     Status: None (Preliminary result)   Collection Time: 08/24/17  2:12 PM  Result Value Ref Range Status   Specimen Description WOUND UPPER ARM PROXIMAL FISTULA  Final   Special Requests SPEC B ON SWABS  Final   Gram Stain   Final    FEW WBC PRESENT, PREDOMINANTLY PMN NO ORGANISMS SEEN Performed at [facility], [address], [place], [place] [address]    Culture   Final    FEW STAPHYLOCOCCUS AUREUS NO ANAEROBES ISOLATED; CULTURE IN PROGRESS FOR 5 DAYS    Report Status PENDING  Incomplete   Organism ID, Bacteria STAPHYLOCOCCUS AUREUS  Final      Susceptibility   Staphylococcus aureus - MIC*    CIPROFLOXACIN <=0.5 SENSITIVE Sensitive     ERYTHROMYCIN >=8 RESISTANT Resistant     GENTAMICIN <=0.5 SENSITIVE Sensitive     OXACILLIN 0.5 SENSITIVE Sensitive     TETRACYCLINE <=1 SENSITIVE Sensitive     VANCOMYCIN <=0.5 SENSITIVE Sensitive     TRIMETH/SULFA <=10 SENSITIVE Sensitive     CLINDAMYCIN <=0.25 SENSITIVE Sensitive     RIFAMPIN <=0.5 SENSITIVE Sensitive     Inducible Clindamycin NEGATIVE Sensitive     * FEW STAPHYLOCOCCUS AUREUS     Time coordinating discharge: 35 minutes  SIGNED:   Rodena Goldmann, DO [facility] 08/28/2017, 1:18 PM Pager [phone]  If 7PM-7AM, please contact night-coverage [email or web address] Password [email or web address]

## 2017-08-29 DIAGNOSIS — Z48812 Encounter for surgical aftercare following surgery on the circulatory system: Secondary | ICD-10-CM | POA: Diagnosis not present

## 2017-08-29 DIAGNOSIS — I428 Other cardiomyopathies: Secondary | ICD-10-CM | POA: Diagnosis not present

## 2017-08-29 DIAGNOSIS — T827XXD Infection and inflammatory reaction due to other cardiac and vascular devices, implants and grafts, subsequent encounter: Secondary | ICD-10-CM | POA: Diagnosis not present

## 2017-08-29 DIAGNOSIS — N2581 Secondary hyperparathyroidism of renal origin: Secondary | ICD-10-CM | POA: Diagnosis not present

## 2017-08-29 DIAGNOSIS — A4101 Sepsis due to Methicillin susceptible Staphylococcus aureus: Secondary | ICD-10-CM | POA: Diagnosis not present

## 2017-08-29 DIAGNOSIS — D631 Anemia in chronic kidney disease: Secondary | ICD-10-CM | POA: Diagnosis not present

## 2017-08-29 DIAGNOSIS — N186 End stage renal disease: Secondary | ICD-10-CM | POA: Diagnosis not present

## 2017-08-29 DIAGNOSIS — I509 Heart failure, unspecified: Secondary | ICD-10-CM | POA: Diagnosis not present

## 2017-08-29 DIAGNOSIS — I132 Hypertensive heart and chronic kidney disease with heart failure and with stage 5 chronic kidney disease, or end stage renal disease: Secondary | ICD-10-CM | POA: Diagnosis not present

## 2017-08-29 DIAGNOSIS — J9601 Acute respiratory failure with hypoxia: Secondary | ICD-10-CM | POA: Diagnosis not present

## 2017-08-29 LAB — AEROBIC/ANAEROBIC CULTURE W GRAM STAIN (SURGICAL/DEEP WOUND)

## 2017-08-29 LAB — AEROBIC/ANAEROBIC CULTURE (SURGICAL/DEEP WOUND): GRAM STAIN: NONE SEEN

## 2017-08-31 ENCOUNTER — Ambulatory Visit: Payer: Medicare HMO

## 2017-08-31 DIAGNOSIS — N186 End stage renal disease: Secondary | ICD-10-CM | POA: Diagnosis not present

## 2017-08-31 DIAGNOSIS — I4891 Unspecified atrial fibrillation: Secondary | ICD-10-CM | POA: Diagnosis not present

## 2017-08-31 DIAGNOSIS — D631 Anemia in chronic kidney disease: Secondary | ICD-10-CM | POA: Diagnosis not present

## 2017-08-31 DIAGNOSIS — A4101 Sepsis due to Methicillin susceptible Staphylococcus aureus: Secondary | ICD-10-CM | POA: Diagnosis not present

## 2017-08-31 MED ORDER — CEFAZOLIN SODIUM 10 G IJ SOLR
3.0000 g | INTRAMUSCULAR | Status: AC
  Administered 2017-09-01: 3 g via INTRAVENOUS
  Filled 2017-08-31: qty 3

## 2017-08-31 NOTE — Progress Notes (Addendum)
Anesthesia Chart Review: SAME DAY WORK-UP.  Case:  [id number] Date/Time:  09/01/17 1305   Procedure:  INSERTION OF DIALYSIS CATHETER (N/A )   Anesthesia type:  Choice   Pre-op diagnosis:  end stage renal disease   Location:  [facility] OR ROOM 82 / [facility]   Surgeon:  Conrad Warner, MD     DISCUSSION: Patient is a 57 year old male with scheduled for insertion of a tunneled dialysis catheter on 09/01/17. 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  He was just admitted 08/17/17-08/28/17 for MSSA bacteremia. ID was consulted. He subsequently had his ICD removed 08/21/17 and also underwent I&D of LUE abscess with excision of distal half of left brachiocephalic AVF with stent includes, and placement of left femoral tunneled dialysis catheter on 08/24/17. He had to remain intubated post-operatively due to hypoxia, but was successfully extubated on 08/25/17. He now has a LUE wound VAC and is receiving 6 weeks of Ancef with hemodialysis. Moving his dialysis catheter to his chest was recommended, so removal of his femoral catheter and placement of a new dialysis catheter has been recommended.    Notations by EP Dr. Lovena Le and Dr. Rayann Heman indicate that patient was to be discharged (08/28/17) with a Zoll life vest. Dr. Sallyanne Kuster was not aware of any appropriate therapy previously from his ICD, but Dr. Lovena Le felt that "due to infectious risk and prior noncompliance that reimplantation in the setting of normalization of EF should be avoided if possible." Patient will follow-up with Dr. Sallyanne Kuster ~  May and can discuss pros/cons of re-implantation at that time. Last warfarin dose 08/26/17. He is on [place] for [facility].  Patient with complex medical history. Need hemodialysis catheter relocated. Recent admission as outlined. I believe procedure may not have been during during hospitalizaiton because warfarin had been resumed. Warfarin now on hold. He is for labs on arrival. Should have a life vest according to recent admission notes. Anesthesiologist to evaluate on the day of surgery.     PROVIDERS: [name], MD as PCP  -Tacey Heap, MD as EP Cardiology. ICD extraction by Cristopher Peru, MD 08/21/17. Percival Spanish, Jeneen Rinks, MD as Cardiology (established 05/2014 after moving from [place]; according to cardiology note from [facility] scanned under Media tab, myocardial biopsy was negative for amyloid '12 and had cardiac radiofrequency ablation '11). Quay Burow, MD as [facility] Cardiology. Alcide Evener, MD as ID.  Renato Shin, MD as Endocrinology. Simonne Maffucci, MD as Pulmonology. -Pearson Grippe, MD as Nephrology. He was declined by [facility] team due to history of hypotension and pulmonary hypertension.  -Jerrell Belfast, MD as ENT.  LABS: He is for labs on the day of his procedure. He will at least need a PT/INR and ISTAT4. As of 08/28/17, WBC 4.0. H/H 7.6/23.9, PLT count 103K. Glucose 81. K 4.1. INR 2.10. AST 14, ALT 6.   IMAGES: 1V CXR 08/25/17 (done is setting of admission, MSSA bacteremia): IMPRESSION: Worsening bilateral perihilar and lower lobe opacities, likely edema/CHF.  EKG: 08/21/17: SR with occasional PVCs and PACs. Low voltage QRS. Possible inferior infarct, age undetermined. Cannot rule out anterior infarct (age undetermined). Non-specific  ST-T changes.   CV: Echo 08/18/17: Study Conclusions - Left ventricle: The cavity size was normal. Wall thickness was increased in a pattern of mild LVH. Systolic function was normal. The estimated ejection fraction  was in the range of 60% to 65%. Wall motion was normal; there were no regional wall motion abnormalities. Doppler parameters are consistent with abnormal left ventricular relaxation (grade 1 diastolic dysfunction). The E/e&' ratio is between 8-15, suggesting indeterminate LV filling pressure. - Ventricular septum: Septal motion showed abnormal function and dyssynergy. The contour showed diastolic flattening and systolic flattening. - Aortic valve: Moderate calcified aortic stenosis. Mean gradient (S): 25 mm Hg. Peak gradient (S): 40 mm Hg. Valve area (VTI): 1.15 cm^2. Valve area (Vmax): 1.11 cm^2. Valve area (Vmean): 1.16 cm^2. - Mitral valve: Calcified annulus. Mildly thickened leaflets . Mild to moderate mitral stenosis. Mean gradient (D): 8 mm Hg. Valve area by continuity equation (using LVOT flow): 1.25 cm^2. - Left atrium: The atrium was mildly dilated. - Right ventricle: The cavity size was moderately dilated. - Right atrium: Severely dilated. - Tricuspid valve: There was moderate regurgitation. - Pulmonary arteries: PA peak pressure: 59 mm Hg (S). - Inferior vena cava: The vessel was dilated. The respirophasic diameter changes were blunted (< 50%), consistent with elevated central venous pressure. Impressions: - Compared to a recent echo in 08/2016, the RVSP is slightly lower at 59 mmHg. There is RV pressure and volume overload. There is also moderate aortic stenosis with a mean gradient of 25 mmHg, moderate RVE and severe RAE, the IVC is dilated. No major valvular vegetations, however, endocarditis cannot be excluded on the basis of this study. (I don't see any report from a 08/20/17. Appears to be listed as a part of the procedure when ICD removed. ID had recommended removal of ICD regardless of if a vegetation was seen by TEE or not.)  RHC 08/15/15 ([place]. Have to click on results section within note): Component Name Value Ref Range  Cardiac Index (l/min/m2) 3.7 L/min/m2  Right  Atrium Mean Pressure (mmHg) 5 mmHg   Right Ventricle Systolic Pressure (mmHg) 46 mmHg   Pulmonary Artery Mean Pressure (mmHg) 31 mmHg   Pulmonary Wedge Pressure (mmHg) 11 mmHg   Pulmonary Vascular Resistance (Wood units) 2.2 Wood units    6 min walk test 07/18/15 ([place]): Physician Interp: Six minute walk distance is mildly below the normal predicted  range. Oxygenation during exercise was adequate (SpO2 =>90%) without the  use of supplemental O2. At peak exercise, the heart rate indicated cardiovascular  maximums were being approached. Perceived dyspnea at end of exercise was absent or mild.  Carotid U/S 05/10/15: Impression: Heterogeneous calcified plaque shadowing, bilaterally. 1-39% right ICA stenosis, higher velocities could be of secured by shadowing. 40-59% left ICA stenosis, higher velocities could be at her by shadowing. 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 Normal subclavian arteries bilaterally. Patent vertebral arteries with antegrade flow.   Stress echo 01/06/08 ([place]): Interpretation: Normal stress echocardiogram.   Past Medical History:  Diagnosis Date  . AICD (automatic cardioverter/defibrillator) present    Medtronic   . Aortic stenosis    moderate AS by 08/2016 echo  . Arthritis    "knees" (08/05/2016)  . Atrial fibrillation ([place])   . ESRD (end stage renal disease) on dialysis ([place])    "M, T, W, T, F; NX stage hemodialysis; I do it at home" (08/05/2016)  . Great toe amputation status ([place])    status post left  hallux amputation 08/01/14  . History of blood transfusion 2009   "S/P biopsy for prostate cancer check"  . Hypertension   . Hypothyroidism (acquired)   . Mitral stenosis    moderate mitral stenosis  . Nonischemic cardiomyopathy ([place])   . Pneumonia 2014; 08/05/2016  . PONV (postoperative nausea and vomiting)   . Pulmonary HTN ([place])   . Renal insufficiency   . Thyroid cancer ([place]) 2011    Past Surgical History:   Procedure Laterality Date  . ARTERIAL LINE INSERTION Right 08/24/2017   Procedure: ARTERIAL LINE INSERTION INTO RIGHT FEMORAL ARTERY;  Surgeon: Conrad Grayville, MD;  Location: [place];  Service: Vascular;  Laterality: Right;  . CARDIAC CATHETERIZATION Right 2017  . CARDIAC DEFIBRILLATOR PLACEMENT  2009  . DIALYSIS FISTULA CREATION Left 2003   "upper arm"  . HERNIA REPAIR    . ICD LEAD REMOVAL Right 08/20/2017   Procedure: ICD EXTRACTION WITH C-ARM;  Surgeon: Evans Lance, MD;  Location[facility] OR;  Service: Cardiovascular;  Laterality: Right;  DR VAN TRIGT TO BACK-UP  . INSERTION OF DIALYSIS CATHETER N/A 11/11/2016   Procedure: INSERTION OF TUNNELED DIALYSIS CATHETER;  Surgeon: Angelia Mould, MD;  Location: [place];  Service: Vascular;  Laterality: N/A;  . INSERTION OF DIALYSIS CATHETER Left 08/24/2017   Procedure: INSERTION OF DIALYSIS CATHETER;  Surgeon: Conrad Fruitland, MD;  Location: [place];  Service: Vascular;  Laterality: Left;  . KIDNEY TRANSPLANT  2009  . KNEE SURGERY Bilateral 2000s   "drained fluid"  . LAPAROSCOPIC GASTRIC BANDING  2006  . PROSTATE BIOPSY  2009  . REVISON OF ARTERIOVENOUS FISTULA Left 5/64/3329   Procedure: PLICATION OF ARTERIOVENOUS FISTULA ANEURYSM;  Surgeon: Elam Dutch, MD;  Location: [place];  Service: Vascular;  Laterality: Left;  . REVISON OF ARTERIOVENOUS FISTULA Left 11/10/2016   Procedure: REVISON OF ARTERIOVENOUS FISTULA - LEFT UPPER ARM;  Surgeon: Elam Dutch, MD;  Location: [place];  Service: Vascular;  Laterality: Left;  . REVISON OF ARTERIOVENOUS FISTULA Left 08/24/2017   Procedure: REVISON OF ARTERIOVENOUS FISTULA LEFT;  Surgeon: Conrad Apopka, MD;  Location: [place];  Service: Vascular;  Laterality: Left;  . TEE WITHOUT CARDIOVERSION N/A 08/20/2017   Procedure: TRANSESOPHAGEAL ECHOCARDIOGRAM (TEE);  Surgeon: Evans Lance, MD;  Location: [place];  Service: Cardiovascular;  Laterality: N/A;  . THROMBECTOMY W/ EMBOLECTOMY Left 12/25/2014   Procedure:  THROMBECTOMY ARTERIOVENOUS FISTULA;  Surgeon: Elam Dutch, MD;  Location: [place];  Service: Vascular;  Laterality: Left;  . THYROIDECTOMY  2011  . TOE AMPUTATION Bilateral     right 1st and  2nd digits; left 1st and 3rd digits"  . UMBILICAL HERNIA REPAIR  X 2  . VEIN HARVEST Left 08/24/2017   Procedure: VEIN HARVEST;  Surgeon: Conrad Bellefonte, MD;  Location: [facility] OR;  Service: Vascular;  Laterality: Left;   MEDICATIONS: No current facility-administered medications for this encounter.    Marland Kitchen ambrisentan (LETAIRIS) 10 MG tablet  . amiodarone (PACERONE) 200 MG tablet  . atorvastatin (LIPITOR) 40 MG tablet  . ceFAZolin (ANCEF) IVPB  . cinacalcet (SENSIPAR) 90 MG tablet  . levothyroxine (SYNTHROID, LEVOTHROID) 300 MCG tablet  . midodrine (PROAMATINE) 10 MG tablet  . tadalafil, PAH, (ADCIRCA) 20 MG tablet  . warfarin (COUMADIN) 6 MG tablet   [facility] Short Stay Center/Anesthesiology Phone [phone] 08/31/2017 11:51 AM

## 2017-09-01 ENCOUNTER — Ambulatory Visit (HOSPITAL_COMMUNITY): Payer: Medicare HMO | Admitting: Vascular Surgery

## 2017-09-01 ENCOUNTER — Encounter (HOSPITAL_COMMUNITY): Payer: Self-pay | Admitting: *Deleted

## 2017-09-01 ENCOUNTER — Telehealth: Payer: Self-pay | Admitting: *Deleted

## 2017-09-01 ENCOUNTER — Encounter (HOSPITAL_COMMUNITY)
Admission: RE | Disposition: A | Discharge status: Home / Self Care | Payer: Self-pay | Source: Ambulatory Visit | Attending: Vascular Surgery

## 2017-09-01 ENCOUNTER — Ambulatory Visit (HOSPITAL_COMMUNITY): Payer: Medicare HMO

## 2017-09-01 ENCOUNTER — Other Ambulatory Visit: Payer: Self-pay

## 2017-09-01 ENCOUNTER — Ambulatory Visit (HOSPITAL_COMMUNITY)
Admission: RE | Admit: 2017-09-01 | Discharge: 2017-09-01 | Disposition: A | Discharge status: Home / Self Care | Payer: Medicare HMO | Source: Ambulatory Visit | Attending: Vascular Surgery | Admitting: Vascular Surgery

## 2017-09-01 DIAGNOSIS — N186 End stage renal disease: Secondary | ICD-10-CM | POA: Diagnosis not present

## 2017-09-01 DIAGNOSIS — Z940 Kidney transplant status: Secondary | ICD-10-CM | POA: Diagnosis not present

## 2017-09-01 DIAGNOSIS — Z4901 Encounter for fitting and adjustment of extracorporeal dialysis catheter: Secondary | ICD-10-CM | POA: Diagnosis not present

## 2017-09-01 DIAGNOSIS — Z452 Encounter for adjustment and management of vascular access device: Secondary | ICD-10-CM | POA: Diagnosis not present

## 2017-09-01 DIAGNOSIS — T82868A Thrombosis of vascular prosthetic devices, implants and grafts, initial encounter: Secondary | ICD-10-CM | POA: Insufficient documentation

## 2017-09-01 DIAGNOSIS — Z992 Dependence on renal dialysis: Secondary | ICD-10-CM | POA: Diagnosis not present

## 2017-09-01 DIAGNOSIS — Z6835 Body mass index (BMI) 35.0-35.9, adult: Secondary | ICD-10-CM | POA: Diagnosis not present

## 2017-09-01 DIAGNOSIS — I272 Pulmonary hypertension, unspecified: Secondary | ICD-10-CM | POA: Diagnosis not present

## 2017-09-01 DIAGNOSIS — Z8249 Family history of ischemic heart disease and other diseases of the circulatory system: Secondary | ICD-10-CM | POA: Insufficient documentation

## 2017-09-01 DIAGNOSIS — Z91041 Radiographic dye allergy status: Secondary | ICD-10-CM | POA: Insufficient documentation

## 2017-09-01 DIAGNOSIS — I120 Hypertensive chronic kidney disease with stage 5 chronic kidney disease or end stage renal disease: Secondary | ICD-10-CM | POA: Diagnosis not present

## 2017-09-01 DIAGNOSIS — Z89411 Acquired absence of right great toe: Secondary | ICD-10-CM | POA: Diagnosis not present

## 2017-09-01 DIAGNOSIS — M170 Bilateral primary osteoarthritis of knee: Secondary | ICD-10-CM | POA: Diagnosis not present

## 2017-09-01 DIAGNOSIS — I428 Other cardiomyopathies: Secondary | ICD-10-CM | POA: Diagnosis not present

## 2017-09-01 DIAGNOSIS — T827XXA Infection and inflammatory reaction due to other cardiac and vascular devices, implants and grafts, initial encounter: Secondary | ICD-10-CM | POA: Diagnosis not present

## 2017-09-01 DIAGNOSIS — D631 Anemia in chronic kidney disease: Secondary | ICD-10-CM | POA: Insufficient documentation

## 2017-09-01 DIAGNOSIS — E039 Hypothyroidism, unspecified: Secondary | ICD-10-CM | POA: Insufficient documentation

## 2017-09-01 DIAGNOSIS — I4891 Unspecified atrial fibrillation: Secondary | ICD-10-CM | POA: Diagnosis not present

## 2017-09-01 DIAGNOSIS — Z89412 Acquired absence of left great toe: Secondary | ICD-10-CM | POA: Diagnosis not present

## 2017-09-01 DIAGNOSIS — Z95810 Presence of automatic (implantable) cardiac defibrillator: Secondary | ICD-10-CM | POA: Insufficient documentation

## 2017-09-01 DIAGNOSIS — E669 Obesity, unspecified: Secondary | ICD-10-CM | POA: Insufficient documentation

## 2017-09-01 DIAGNOSIS — Y712 Prosthetic and other implants, materials and accessory cardiovascular devices associated with adverse incidents: Secondary | ICD-10-CM | POA: Diagnosis not present

## 2017-09-01 DIAGNOSIS — Z419 Encounter for procedure for purposes other than remedying health state, unspecified: Secondary | ICD-10-CM

## 2017-09-01 DIAGNOSIS — I739 Peripheral vascular disease, unspecified: Secondary | ICD-10-CM | POA: Insufficient documentation

## 2017-09-01 HISTORY — PX: DIALYSIS/PERMA CATHETER REMOVAL: CATH118289

## 2017-09-01 HISTORY — PX: INSERTION OF DIALYSIS CATHETER: SHX1324

## 2017-09-01 LAB — POCT I-STAT 4, (NA,K, GLUC, HGB,HCT)
Glucose, Bld: 67 mg/dL (ref 65–99)
HCT: 27 % — ABNORMAL LOW (ref 39.0–52.0)
Hemoglobin: 9.2 g/dL — ABNORMAL LOW (ref 13.0–17.0)
Potassium: 3.6 mmol/L (ref 3.5–5.1)
SODIUM: 136 mmol/L (ref 135–145)

## 2017-09-01 LAB — PROTIME-INR
INR: 2.5
PROTHROMBIN TIME: 26.8 s — AB (ref 11.4–15.2)

## 2017-09-01 LAB — GLUCOSE, CAPILLARY
GLUCOSE-CAPILLARY: 70 mg/dL (ref 65–99)
Glucose-Capillary: 50 mg/dL — ABNORMAL LOW (ref 65–99)
Glucose-Capillary: 72 mg/dL (ref 65–99)

## 2017-09-01 SURGERY — INSERTION OF DIALYSIS CATHETER
Anesthesia: Monitor Anesthesia Care | Site: Groin | Laterality: Left

## 2017-09-01 MED ORDER — FENTANYL CITRATE (PF) 100 MCG/2ML IJ SOLN
INTRAMUSCULAR | Status: AC
  Filled 2017-09-01: qty 2

## 2017-09-01 MED ORDER — DEXMEDETOMIDINE HCL IN NACL 400 MCG/100ML IV SOLN
INTRAVENOUS | Status: DC | PRN
  Administered 2017-09-01: 0.7 ug/kg/h via INTRAVENOUS

## 2017-09-01 MED ORDER — FENTANYL CITRATE (PF) 100 MCG/2ML IJ SOLN
25.0000 ug | INTRAMUSCULAR | Status: DC | PRN
  Administered 2017-09-01: 25 ug via INTRAVENOUS

## 2017-09-01 MED ORDER — FENTANYL CITRATE (PF) 250 MCG/5ML IJ SOLN
INTRAMUSCULAR | Status: AC
  Filled 2017-09-01: qty 5

## 2017-09-01 MED ORDER — HEPARIN SODIUM (PORCINE) 1000 UNIT/ML IJ SOLN
INTRAMUSCULAR | Status: DC | PRN
  Administered 2017-09-01: 1000 [IU]

## 2017-09-01 MED ORDER — SODIUM CHLORIDE 0.9 % IV SOLN
INTRAVENOUS | Status: DC | PRN
  Administered 2017-09-01: 18:00:00 via INTRAVENOUS

## 2017-09-01 MED ORDER — HEPARIN SODIUM (PORCINE) 1000 UNIT/ML IJ SOLN
INTRAMUSCULAR | Status: AC
  Filled 2017-09-01: qty 1

## 2017-09-01 MED ORDER — 0.9 % SODIUM CHLORIDE (POUR BTL) OPTIME
TOPICAL | Status: DC | PRN
  Administered 2017-09-01: 1000 mL

## 2017-09-01 MED ORDER — PROPOFOL 10 MG/ML IV BOLUS
INTRAVENOUS | Status: AC
  Filled 2017-09-01: qty 20

## 2017-09-01 MED ORDER — LIDOCAINE-EPINEPHRINE (PF) 1 %-1:200000 IJ SOLN
INTRAMUSCULAR | Status: AC
  Filled 2017-09-01: qty 30

## 2017-09-01 MED ORDER — PHENYLEPHRINE HCL 10 MG/ML IJ SOLN
INTRAMUSCULAR | Status: DC | PRN
  Administered 2017-09-01: 80 ug via INTRAVENOUS

## 2017-09-01 MED ORDER — SODIUM CHLORIDE 0.9 % IV SOLN
INTRAVENOUS | Status: DC | PRN
  Administered 2017-09-01: 500 mL

## 2017-09-01 MED ORDER — LIDOCAINE-EPINEPHRINE (PF) 1 %-1:200000 IJ SOLN
INTRAMUSCULAR | Status: DC | PRN
  Administered 2017-09-01: 15 mL

## 2017-09-01 MED ORDER — SODIUM CHLORIDE 0.9 % IV SOLN
INTRAVENOUS | Status: DC
  Administered 2017-09-01: 11:00:00 via INTRAVENOUS

## 2017-09-01 MED ORDER — DEXTROSE 50 % IV SOLN
INTRAVENOUS | Status: AC
  Filled 2017-09-01: qty 50

## 2017-09-01 MED ORDER — DEXTROSE 50 % IV SOLN
INTRAVENOUS | Status: DC | PRN
  Administered 2017-09-01: 25 g via INTRAVENOUS

## 2017-09-01 MED ORDER — HEPARIN SODIUM (PORCINE) 5000 UNIT/ML IJ SOLN
INTRAMUSCULAR | Status: AC
  Filled 2017-09-01: qty 1.2

## 2017-09-01 SURGICAL SUPPLY — 43 items
BAG DECANTER FOR FLEXI CONT (MISCELLANEOUS) ×3 IMPLANT
BIOPATCH RED 1 DISK 7.0 (GAUZE/BANDAGES/DRESSINGS) ×3 IMPLANT
CATH PALINDROME RT-P 15FX19CM (CATHETERS) IMPLANT
CATH PALINDROME RT-P 15FX23CM (CATHETERS) IMPLANT
CATH PALINDROME RT-P 15FX28CM (CATHETERS) ×3 IMPLANT
CATH PALINDROME RT-P 15FX55CM (CATHETERS) IMPLANT
CATH STRAIGHT 5FR 65CM (CATHETERS) ×3 IMPLANT
COVER PROBE W GEL 5X96 (DRAPES) ×3 IMPLANT
COVER SURGICAL LIGHT HANDLE (MISCELLANEOUS) ×3 IMPLANT
DECANTER SPIKE VIAL GLASS SM (MISCELLANEOUS) ×3 IMPLANT
DERMABOND ADVANCED (GAUZE/BANDAGES/DRESSINGS) ×1
DERMABOND ADVANCED .7 DNX12 (GAUZE/BANDAGES/DRESSINGS) ×2 IMPLANT
DRAPE C-ARM 42X72 X-RAY (DRAPES) ×3 IMPLANT
DRAPE CHEST BREAST 15X10 FENES (DRAPES) IMPLANT
DRAPE LAPAROSCOPIC ABDOMINAL (DRAPES) ×3 IMPLANT
GAUZE SPONGE 4X4 12PLY STRL LF (GAUZE/BANDAGES/DRESSINGS) ×3 IMPLANT
GAUZE SPONGE 4X4 16PLY XRAY LF (GAUZE/BANDAGES/DRESSINGS) ×3 IMPLANT
GLOVE BIO SURGEON STRL SZ7 (GLOVE) ×3 IMPLANT
GLOVE BIOGEL PI IND STRL 7.5 (GLOVE) ×2 IMPLANT
GLOVE BIOGEL PI INDICATOR 7.5 (GLOVE) ×1
GOWN STRL REUS W/ TWL LRG LVL3 (GOWN DISPOSABLE) ×4 IMPLANT
GOWN STRL REUS W/TWL LRG LVL3 (GOWN DISPOSABLE) ×2
KIT BASIN OR (CUSTOM PROCEDURE TRAY) ×3 IMPLANT
KIT TURNOVER KIT B (KITS) ×3 IMPLANT
NEEDLE 18GX1X1/2 (RX/OR ONLY) (NEEDLE) ×3 IMPLANT
NEEDLE HYPO 25GX1X1/2 BEV (NEEDLE) ×3 IMPLANT
NS IRRIG 1000ML POUR BTL (IV SOLUTION) ×3 IMPLANT
PACK SURGICAL SETUP 50X90 (CUSTOM PROCEDURE TRAY) ×3 IMPLANT
PAD ARMBOARD 7.5X6 YLW CONV (MISCELLANEOUS) ×6 IMPLANT
SET MICROPUNCTURE 5F STIFF (MISCELLANEOUS) IMPLANT
SOAP 2 % CHG 4 OZ (WOUND CARE) ×3 IMPLANT
SUT ETHILON 3 0 PS 1 (SUTURE) ×3 IMPLANT
SUT MNCRL AB 4-0 PS2 18 (SUTURE) ×3 IMPLANT
SYR 10ML LL (SYRINGE) ×3 IMPLANT
SYR 20CC LL (SYRINGE) ×6 IMPLANT
SYR 3ML LL SCALE MARK (SYRINGE) ×3 IMPLANT
SYR 5ML LL (SYRINGE) ×3 IMPLANT
SYR CONTROL 10ML LL (SYRINGE) ×3 IMPLANT
TAPE CLOTH SURG 6X10 WHT LF (GAUZE/BANDAGES/DRESSINGS) ×3 IMPLANT
TOWEL GREEN STERILE (TOWEL DISPOSABLE) ×3 IMPLANT
TOWEL GREEN STERILE FF (TOWEL DISPOSABLE) ×3 IMPLANT
WATER STERILE IRR 1000ML POUR (IV SOLUTION) ×3 IMPLANT
WIRE AMPLATZ SS-J .035X180CM (WIRE) ×3 IMPLANT

## 2017-09-01 NOTE — Op Note (Signed)
OPERATIVE NOTE  PROCEDURE: 1. Left internal jugular vein tunneled dialysis catheter placement 2. Bilateral internal jugular vein cannulation under ultrasound guidance 3. Removal of left femoral tunneled dialysis catheter placement  PRE-OPERATIVE DIAGNOSIS:  hemodialysis dependence  POST-OPERATIVE DIAGNOSIS: same as above  SURGEON: Adele Barthel, MD  ANESTHESIA: local and MAC  ESTIMATED BLOOD LOSS: 30 cc  FINDING(S): 1.  Thrombus in right internal jugular vein  2.  Possible right internal jugular vein proximal occlusion vs high grade stenosis: unable to advance J-wire into innominate vein 3.  Tips of the catheter in the right atrium on fluoroscopy 4.  No obvious pneumothorax on fluoroscopy  SPECIMEN(S):  none  INDICATIONS:   Adam [name]. is a 57 y.o. male who presents with hemodialysis dependence.  The patient presents for tunneled dialysis catheter placement.  The patient is aware the risks of tunneled dialysis catheter placement include but are not limited to: bleeding, infection, central venous injury, pneumothorax, possible venous stenosis, possible malpositioning in the venous system, and possible infections related to long-term catheter presence.  The patient was aware of these risks and agreed to proceed.   DESCRIPTION: After written full informed consent was obtained from the patient, the patient was taken back to the operating room.  Prior to induction, the patient was given IV antibiotics.  After obtaining adequate sedation, the patient was prepped and draped in the standard fashion for a chest or neck tunneled dialysis catheter placement.     I first visualized a right internal jugular vein which appeared to have a small thrombus in it.  I anesthesized the skin overlying the vein with 5 cc of 1% lidocaine without epinephrine.  I cannulated the vein without difficulty but the J-wire would not advance more than 5 cm.  I pulled out the wire and needle and held  pressure.  This was repeated a second time with the same outcome, suggesting possible proximal right internal jugular vein occlusion vs high grade stenosis.  Pressure was held to this cannulation site for 3 minutes.  No active bleeding was present.  I turned my attention to the left neck.  The cannulation site, the catheter exit site, and tract for the subcutaneous tunnel were then anesthestized with a total of 10 cc of 1% lidocaine without epinephrine.   Under ultrasound guidance, the left internal jugular vein was cannulated with a 18 gauge needle.  A J-wire was then placed down in the inferior vena cava under fluoroscopic guidance.  The wire was then secured in place with a clamp to the drapes.  I then made stab incisions at the neck and exit sites.   There appeared to be near right angle in the left innominate vein, so I felt additional precautions needed to be taken.    I dissected from the exit site to the cannulation site with a metal dissector.   The wire was then unclamped and I removed the needle.  A straight catheter was loaded over the wire and advanced into the  inferior vena cava.  The wire was then exchanged for an Amplatz super stiff wire.  The catheter was then removed.  Over the wire, the skin tract and venotomy was dilated serially with graduated dilators.  Finally, the dilator-sheath was placed over the wire under fluoroscopic guidance into the superior vena cava.  The dilator was removed.  A 28 cm Palindrome catheter was woven over the wire and advanced under fluoroscopic guidance down into the right atrium.  The wire  was then removed, and the sheath was broken and peeled away while holding the catheter cuff at the level of the skin.    The catheter was clamped with the plastic clamp and the back end of this catheter was transected.  One lumen was docked onto the tunneler.  The catheter was delivered through the subcutaneous tunnel by pulling the tunneler out the exit site.  The catheter  was reclamped and was transected a second time, revealing the two lumens of this catheter.  The catheter collar was loaded over the back end of the catheter.  The two  ports were docked onto these two lumens.  The catheter collar was then snapped into place, securing the two ports.  Each port was tested by aspirating and flushing.  No resistance was noted.  Each port was then thoroughly flushed with heparinized saline.  The catheter was secured in placed with two interrupted stitches of 3-0 Nylon tied to the catheter.  The neck incision was closed with a U-stitch of 4-0 Monocryl.  The neck and chest incision were cleaned.  The closed neck incision was reinforced with Dermabond and sterile bandages applied to the catheter exit site.  Each port was then loaded with concentrated heparin (1000 Units/mL) at the manufacturer recommended volumes to each port.  Sterile caps were applied to each port.    On completion fluoroscopy, the tips of the catheter were in the right atrium, and there was no evidence of pneumothorax.  At this point, I broke scrub and then turned my attention to the left thigh.  I transected the two securing stitches in the left femoral tunneled dialysis catheter and then pulled out the catheter, while holding pressure to the right femoral vein.  After a few minutes, no further bleeding occurred.  A sterile dressing was applied to the femoral tunneled dialysis catheter exit site.   COMPLICATIONS: none  CONDITION: stable   Adele Barthel, MD, [facility] [place] Office: [phone] Pager: [phone]  09/01/2017, 6:36 PM

## 2017-09-01 NOTE — Anesthesia Preprocedure Evaluation (Signed)
Anesthesia Evaluation  Patient identified by MRN, date of birth, ID band Patient awake    Reviewed: Allergy & Precautions, NPO status , Patient's Chart, lab work & pertinent test results  History of Anesthesia Complications (+) PONV and history of anesthetic complications  Airway Mallampati: I  TM Distance: >3 FB Neck ROM: Full    Dental  (+) Edentulous Upper, Dental Advisory Given   Pulmonary pneumonia,    breath sounds clear to auscultation       Cardiovascular hypertension, + Peripheral Vascular Disease  + dysrhythmias Atrial Fibrillation + Cardiac Defibrillator + Valvular Problems/Murmurs AS  Rhythm:Regular Rate:Normal  EF Normal. Moderate AS. Moderate TR. Dilated RV.   Neuro/Psych negative neurological ROS     GI/Hepatic negative GI ROS, Neg liver ROS,   Endo/Other  Hypothyroidism   Renal/GU ESRF and DialysisRenal disease     Musculoskeletal  (+) Arthritis ,   Abdominal (+) + obese,   Peds  Hematology  (+) anemia ,   Anesthesia Other Findings   Reproductive/Obstetrics                             Lab Results  Component Value Date   WBC 4.0 08/28/2017   HGB 9.2 (L) 09/01/2017   HCT 27.0 (L) 09/01/2017   MCV 90.2 08/28/2017   PLT 103 (L) 08/28/2017   Lab Results  Component Value Date   CREATININE 7.87 (H) 08/28/2017   BUN 28 (H) 08/28/2017   NA 136 09/01/2017   K 3.6 09/01/2017   CL 98 (L) 08/28/2017   CO2 23 08/28/2017   Lab Results  Component Value Date   INR 2.10 08/28/2017   INR 1.76 08/27/2017   INR 1.57 08/26/2017   Echo: - Left ventricle: The cavity size was normal. Wall thickness was   increased in a pattern of mild LVH. Systolic function was normal.   The estimated ejection fraction was in the range of 60% to 65%.   Wall motion was normal; there were no regional wall motion   abnormalities. Doppler parameters are consistent with abnormal   left ventricular  relaxation (grade 1 diastolic dysfunction). The   E/e&' ratio is between 8-15, suggesting indeterminate LV filling   pressure. - Ventricular septum: Septal motion showed abnormal function and   dyssynergy. The contour showed diastolic flattening and systolic   flattening. - Aortic valve: Moderate calcified aortic stenosis. Mean gradient   (S): 25 mm Hg. Peak gradient (S): 40 mm Hg. Valve area (VTI):   1.15 cm^2. Valve area (Vmax): 1.11 cm^2. Valve area (Vmean): 1.16   cm^2. - Mitral valve: Calcified annulus. Mildly thickened leaflets . Mild   to moderate stenosis. Mean gradient (D): 8 mm Hg. Valve area by   continuity equation (using LVOT flow): 1.25 cm^2. - Left atrium: The atrium was mildly dilated. - Right ventricle: The cavity size was moderately dilated. - Right atrium: Severely dilated. - Tricuspid valve: There was moderate regurgitation. - Pulmonary arteries: PA peak pressure: 59 mm Hg (S). - Inferior vena cava: The vessel was dilated. The respirophasic   diameter changes were blunted (< 50%), consistent with elevated   central venous pressure.  Anesthesia Physical  Anesthesia Plan  ASA: IV  Anesthesia Plan: MAC   Post-op Pain Management:    Induction: Intravenous  PONV Risk Score and Plan: 3 and Ondansetron, Dexamethasone, Treatment may vary due to age or medical condition and Propofol infusion  Airway Management Planned: Natural Airway  and Simple Face Mask  Additional Equipment:   Intra-op Plan:   Post-operative Plan:   Informed Consent: I have reviewed the patients History and Physical, chart, labs and discussed the procedure including the risks, benefits and alternatives for the proposed anesthesia with the patient or authorized representative who has indicated his/her understanding and acceptance.   Dental advisory given  Plan Discussed with: CRNA  Anesthesia Plan Comments:         Anesthesia Quick Evaluation

## 2017-09-01 NOTE — Telephone Encounter (Signed)
Spoke with patient to move appointment to afternoon of 09/03/17 as there is no EP MD in the office that AM.  Patient is agreeable to appointment at 2:30pm on 4/25.  He is appreciative of call.

## 2017-09-01 NOTE — Transfer of Care (Signed)
Immediate Anesthesia Transfer of Care Note  Patient: Adam [name].  Procedure(s) Performed: INSERTION OF DIALYSIS CATHETER (Left Chest) DIALYSIS/PERMA CATHETER REMOVAL (Left Groin)  Patient Location: PACU  Anesthesia Type:MAC  Level of Consciousness: awake, alert  and oriented  Airway & Oxygen Therapy: Patient Spontanous Breathing and Patient connected to nasal cannula oxygen  Post-op Assessment: Report given to RN and Post -op Vital signs reviewed and stable  Post vital signs: Reviewed and stable  Last Vitals:  Vitals Value Taken Time  BP 102/70 09/01/2017  6:59 PM  Temp 36.2 C 09/01/2017  6:58 PM  Pulse 81 09/01/2017  7:04 PM  Resp 16 09/01/2017  7:04 PM  SpO2 98 % 09/01/2017  7:04 PM  Vitals shown include unvalidated device data.  Last Pain:  Vitals:   09/01/17 1037  TempSrc:   PainSc: 5       Patients Stated Pain Goal: 4 (87/68/11 5726)  Complications: No apparent anesthesia complications

## 2017-09-01 NOTE — Progress Notes (Signed)
Called Dr. Glennon Mac Anesthesia pts CBG post op 14, pre op was 22.  Also BP 90/66 which pt reports is his norm.  Pt having 5/10 pain and she is ok to go ahead and give a little fentanyl.  Will continue to monitor.

## 2017-09-01 NOTE — Anesthesia Postprocedure Evaluation (Signed)
Anesthesia Post Note  Patient: Adam [name].  Procedure(s) Performed: INSERTION OF DIALYSIS CATHETER (Left Chest) DIALYSIS/PERMA CATHETER REMOVAL (Left Groin)     Patient location during evaluation: PACU Anesthesia Type: MAC Level of consciousness: awake and alert, oriented and patient cooperative Pain management: pain level controlled Vital Signs Assessment: post-procedure vital signs reviewed and stable Respiratory status: spontaneous breathing, nonlabored ventilation and respiratory function stable Cardiovascular status: blood pressure returned to baseline and stable Postop Assessment: no apparent nausea or vomiting Anesthetic complications: no    Last Vitals:  Vitals:   09/01/17 1945 09/01/17 2000  BP: 90/68 91/70  Pulse: 87 81  Resp: (!) 24 (!) 25  Temp:  (!) 36.2 C  SpO2:      Last Pain:  Vitals:   09/01/17 2000  TempSrc:   PainSc: 0-No pain                 Chimaobi Casebolt,E. Mauri Tolen

## 2017-09-01 NOTE — H&P (Signed)
Brief History and Physical  History of Present Illness   Adam [name]. is a 57 y.o. male who presents with chief complaint: end stage renal disease.  The patient presents today for placement of tunneled dialysis catheter and removal of left femoral tunneled dialysis catheter.  Left femoral tunneled dialysis catheter was placed during his recent excision of left brachiocephalic arteriovenous fistula to avoid irritating his right atrium given his shocky presentation in the operating room.  The patient notes he feels much better today, without any fever or chills.   Past Medical History:  Diagnosis Date  . AICD (automatic cardioverter/defibrillator) present    Medtronic   . Aortic stenosis    moderate AS by 08/2016 echo  . Arthritis    "knees" (08/05/2016)  . Atrial fibrillation ([place])   . ESRD (end stage renal disease) on dialysis ([place])    "M, T, W, T, F; NX stage hemodialysis; I do it at home" (08/05/2016)  . Great toe amputation status ([place])    status post left hallux amputation 08/01/14  . History of blood transfusion 2009   "S/P biopsy for prostate cancer check"  . Hypertension   . Hypothyroidism (acquired)   . Mitral stenosis    moderate mitral stenosis  . Nonischemic cardiomyopathy ([place])   . Pneumonia 2014; 08/05/2016  . PONV (postoperative nausea and vomiting)   . Pulmonary HTN ([place])   . Renal insufficiency   . Thyroid cancer ([place]) 2011    Past Surgical History:  Procedure Laterality Date  . ARTERIAL LINE INSERTION Right 08/24/2017   Procedure: ARTERIAL LINE INSERTION INTO RIGHT FEMORAL ARTERY;  Surgeon: Conrad McElhattan, MD;  Location: [facility];  Service: Vascular;  Laterality: Right;  . CARDIAC CATHETERIZATION Right 2017  . CARDIAC DEFIBRILLATOR PLACEMENT  2009  . DIALYSIS FISTULA CREATION Left 2003   "upper arm"  . HERNIA REPAIR    . ICD LEAD REMOVAL Right 08/20/2017   Procedure: ICD EXTRACTION WITH C-ARM;  Surgeon: Evans Lance, MD;  Location: [facility];  Service:  Cardiovascular;  Laterality: Right;  DR VAN TRIGT TO BACK-UP  . INSERTION OF DIALYSIS CATHETER N/A 11/11/2016   Procedure: INSERTION OF TUNNELED DIALYSIS CATHETER;  Surgeon: Angelia Mould, MD;  Location: [place];  Service: Vascular;  Laterality: N/A;  . INSERTION OF DIALYSIS CATHETER Left 08/24/2017   Procedure: INSERTION OF DIALYSIS CATHETER;  Surgeon: Conrad Dayton, MD;  Location: [facility];  Service: Vascular;  Laterality: Left;  . KIDNEY TRANSPLANT  2009  . KNEE SURGERY Bilateral 2000s   "drained fluid"  . LAPAROSCOPIC GASTRIC BANDING  2006  . PROSTATE BIOPSY  2009  . REVISON OF ARTERIOVENOUS FISTULA Left 1/94/1740   Procedure: PLICATION OF ARTERIOVENOUS FISTULA ANEURYSM;  Surgeon: Elam Dutch, MD;  Location: [facility];  Service: Vascular;  Laterality: Left;  . REVISON OF ARTERIOVENOUS FISTULA Left 11/10/2016   Procedure: REVISON OF ARTERIOVENOUS FISTULA - LEFT UPPER ARM;  Surgeon: Elam Dutch, MD;  Location: [place];  Service: Vascular;  Laterality: Left;  . REVISON OF ARTERIOVENOUS FISTULA Left 08/24/2017   Procedure: REVISON OF ARTERIOVENOUS FISTULA LEFT;  Surgeon: Conrad Rivesville, MD;  Location: [place];  Service: Vascular;  Laterality: Left;  . TEE WITHOUT CARDIOVERSION N/A 08/20/2017   Procedure: TRANSESOPHAGEAL ECHOCARDIOGRAM (TEE);  Surgeon: Evans Lance, MD;  Location: [place];  Service: Cardiovascular;  Laterality: N/A;  . THROMBECTOMY W/ EMBOLECTOMY Left 12/25/2014   Procedure: THROMBECTOMY ARTERIOVENOUS FISTULA;  Surgeon: Jessy Oto  Fields, MD;  Location: [facility];  Service: Vascular;  Laterality: Left;  . THYROIDECTOMY  2011  . TOE AMPUTATION Bilateral     right 1st and  2nd digits; left 1st and 3rd digits"  . UMBILICAL HERNIA REPAIR  X 2  . VEIN HARVEST Left 08/24/2017   Procedure: VEIN HARVEST;  Surgeon: Conrad Ashton, MD;  [facility] OR;  Service: Vascular;  Laterality: Left;    Social History   Socioeconomic History  . Marital status: Married    Spouse name: Not on file   . Number of children: 1  . Years of education: Not on file  . Highest education level: Not on file  Occupational History  . Not on file  Social Needs  . Financial resource strain: Not on file  . Food insecurity:    Worry: Not on file    Inability: Not on file  . Transportation needs:    Medical: Not on file    Non-medical: Not on file  Tobacco Use  . Smoking status: Never Smoker  . Smokeless tobacco: Never Used  Substance and Sexual Activity  . Alcohol use: Yes    Alcohol/week: 0.0 oz    Comment: 08/05/2016 "might have 1 shot/month of liquor"  . Drug use: No  . Sexual activity: Yes  Lifestyle  . Physical activity:    Days per week: Not on file    Minutes per session: Not on file  . Stress: Not on file  Relationships  . Social connections:    Talks on phone: Not on file    Gets together: Not on file    Attends religious service: Not on file    Active member of club or organization: Not on file    Attends meetings of clubs or organizations: Not on file    Relationship status: Not on file  . Intimate partner violence:    Fear of current or ex partner: Not on file    Emotionally abused: Not on file    Physically abused: Not on file    Forced sexual activity: Not on file  Other Topics Concern  . Not on file  Social History Narrative   Retired Archivist.      Family History  Problem Relation Age of Onset  . Heart failure Mother   . Hypertension Mother   . CAD Mother 54  . Emphysema Mother   . Hypertension Father   . Kidney failure Father     Current Facility-Administered Medications  Medication Dose Route Frequency Provider Last Rate Last Dose  . 0.9 %  sodium chloride infusion   Intravenous Continuous Conrad [place], MD 10 mL/hr at 09/01/17 1044    . ceFAZolin (ANCEF) 3 g in dextrose 5 % 50 mL IVPB  3 g Intravenous To SS-Surg Conrad Hayes, MD        Allergies  Allergen Reactions  . Enalaprilat Swelling    Mouth swelling  . Vasotec  [Enalapril] Swelling    MOUTH SWELLING  . Ivp Dye [Iodinated Diagnostic Agents] Nausea And Vomiting    Review of Systems: As listed above, otherwise negative.   Physical Examination   Vitals:   09/01/17 1028 09/01/17 1037  BP: 91/63   Pulse: 77   Resp: 18   Temp: (!) 97.5 F (36.4 C)   TempSrc: Oral   SpO2: 96%   Weight: 272 lb (123.4 kg) 272 lb (123.4 kg)  Height:  6\' 1"  (1.854 m)   Body mass  index is 35.89 kg/m.  General Alert, O x 3, WD, NAD  Pulmonary Sym exp, good B air movt, CTA B  Cardiac RRR, Nl S1, S2, no Murmurs, No rubs, No S3,S4  Musculo- skeletal L arm wrapped with [place], L femoral tunneled dialysis catheter   Neurologic Pain and light touch intact in extremities ,     Laboratory  See iStat   Medical Decision Making   Adam [name]. is a 57 y.o. male who presents with: end stage renal disease, s/p L necrotic BC AVF excision   The patient is scheduled for: neck or chest tunneled dialysis catheter placement, removal of left femoral tunneled dialysis catheter. The patient is aware the risks of tunneled dialysis catheter placement include but are not limited to: bleeding, infection, central venous injury, pneumothorax, possible venous stenosis, possible malpositioning in the venous system, and possible infections related to long-term catheter presence.   The patient is aware of the risks and agrees to proceed.   Adele Barthel, MD, FACS [facility] Office: [phone] Pager: [phone]  09/01/2017, 4:46 PM

## 2017-09-02 ENCOUNTER — Ambulatory Visit (INDEPENDENT_AMBULATORY_CARE_PROVIDER_SITE_OTHER): Payer: Medicare HMO | Admitting: Pharmacist

## 2017-09-02 ENCOUNTER — Encounter (HOSPITAL_COMMUNITY): Payer: Self-pay | Admitting: Vascular Surgery

## 2017-09-02 DIAGNOSIS — I480 Paroxysmal atrial fibrillation: Secondary | ICD-10-CM

## 2017-09-02 DIAGNOSIS — Z48812 Encounter for surgical aftercare following surgery on the circulatory system: Secondary | ICD-10-CM | POA: Diagnosis not present

## 2017-09-02 DIAGNOSIS — N186 End stage renal disease: Secondary | ICD-10-CM | POA: Diagnosis not present

## 2017-09-02 DIAGNOSIS — A4101 Sepsis due to Methicillin susceptible Staphylococcus aureus: Secondary | ICD-10-CM | POA: Diagnosis not present

## 2017-09-02 DIAGNOSIS — J9601 Acute respiratory failure with hypoxia: Secondary | ICD-10-CM | POA: Diagnosis not present

## 2017-09-02 DIAGNOSIS — I428 Other cardiomyopathies: Secondary | ICD-10-CM | POA: Diagnosis not present

## 2017-09-02 DIAGNOSIS — I132 Hypertensive heart and chronic kidney disease with heart failure and with stage 5 chronic kidney disease, or end stage renal disease: Secondary | ICD-10-CM | POA: Diagnosis not present

## 2017-09-02 DIAGNOSIS — T827XXD Infection and inflammatory reaction due to other cardiac and vascular devices, implants and grafts, subsequent encounter: Secondary | ICD-10-CM | POA: Diagnosis not present

## 2017-09-02 DIAGNOSIS — N2581 Secondary hyperparathyroidism of renal origin: Secondary | ICD-10-CM | POA: Diagnosis not present

## 2017-09-02 DIAGNOSIS — D631 Anemia in chronic kidney disease: Secondary | ICD-10-CM | POA: Diagnosis not present

## 2017-09-02 DIAGNOSIS — I509 Heart failure, unspecified: Secondary | ICD-10-CM | POA: Diagnosis not present

## 2017-09-03 ENCOUNTER — Ambulatory Visit (INDEPENDENT_AMBULATORY_CARE_PROVIDER_SITE_OTHER): Payer: Medicare HMO | Admitting: *Deleted

## 2017-09-03 DIAGNOSIS — T827XXD Infection and inflammatory reaction due to other cardiac and vascular devices, implants and grafts, subsequent encounter: Secondary | ICD-10-CM

## 2017-09-03 DIAGNOSIS — B9561 Methicillin susceptible Staphylococcus aureus infection as the cause of diseases classified elsewhere: Secondary | ICD-10-CM

## 2017-09-03 DIAGNOSIS — R7881 Bacteremia: Secondary | ICD-10-CM

## 2017-09-03 NOTE — Patient Instructions (Signed)
Your incision is healing well.  The sutures were removed at your appointment today.  Call the [facility] at [phone] if you note drainage from the incision, redness, fever, or chills.

## 2017-09-03 NOTE — Progress Notes (Signed)
Wound check in clinic, s/p ICD extraction on 08/21/17. Sutures removed. Incision edges approximated and healing well. No drainage, redness, or swelling noted. Patient denies fever or chills. Advised patient to continue monitoring site for signs/symptoms of infection and call if any are noted. Patient verbalizes understanding of instructions. Life Vest in place. ROV with Dr. Sallyanne Kuster on 09/21/17.  Patient gave verbal permission to include the following photo:

## 2017-09-05 ENCOUNTER — Encounter: Payer: Self-pay | Admitting: Cardiology

## 2017-09-05 DIAGNOSIS — J9601 Acute respiratory failure with hypoxia: Secondary | ICD-10-CM | POA: Diagnosis not present

## 2017-09-05 DIAGNOSIS — I509 Heart failure, unspecified: Secondary | ICD-10-CM | POA: Diagnosis not present

## 2017-09-05 DIAGNOSIS — N2581 Secondary hyperparathyroidism of renal origin: Secondary | ICD-10-CM | POA: Diagnosis not present

## 2017-09-05 DIAGNOSIS — A4101 Sepsis due to Methicillin susceptible Staphylococcus aureus: Secondary | ICD-10-CM | POA: Diagnosis not present

## 2017-09-05 DIAGNOSIS — I428 Other cardiomyopathies: Secondary | ICD-10-CM | POA: Diagnosis not present

## 2017-09-05 DIAGNOSIS — Z48812 Encounter for surgical aftercare following surgery on the circulatory system: Secondary | ICD-10-CM | POA: Diagnosis not present

## 2017-09-05 DIAGNOSIS — I132 Hypertensive heart and chronic kidney disease with heart failure and with stage 5 chronic kidney disease, or end stage renal disease: Secondary | ICD-10-CM | POA: Diagnosis not present

## 2017-09-05 DIAGNOSIS — T827XXD Infection and inflammatory reaction due to other cardiac and vascular devices, implants and grafts, subsequent encounter: Secondary | ICD-10-CM | POA: Diagnosis not present

## 2017-09-05 DIAGNOSIS — N186 End stage renal disease: Secondary | ICD-10-CM | POA: Diagnosis not present

## 2017-09-05 DIAGNOSIS — D631 Anemia in chronic kidney disease: Secondary | ICD-10-CM | POA: Diagnosis not present

## 2017-09-06 ENCOUNTER — Encounter: Payer: Self-pay | Admitting: Family Medicine

## 2017-09-07 LAB — PROTIME-INR: INR: 10.8 — AB (ref 0.9–1.1)

## 2017-09-08 DIAGNOSIS — I132 Hypertensive heart and chronic kidney disease with heart failure and with stage 5 chronic kidney disease, or end stage renal disease: Secondary | ICD-10-CM | POA: Diagnosis not present

## 2017-09-08 DIAGNOSIS — T827XXD Infection and inflammatory reaction due to other cardiac and vascular devices, implants and grafts, subsequent encounter: Secondary | ICD-10-CM | POA: Diagnosis not present

## 2017-09-08 DIAGNOSIS — N186 End stage renal disease: Secondary | ICD-10-CM | POA: Diagnosis not present

## 2017-09-08 DIAGNOSIS — J9601 Acute respiratory failure with hypoxia: Secondary | ICD-10-CM | POA: Diagnosis not present

## 2017-09-08 DIAGNOSIS — D631 Anemia in chronic kidney disease: Secondary | ICD-10-CM | POA: Diagnosis not present

## 2017-09-08 DIAGNOSIS — Z48812 Encounter for surgical aftercare following surgery on the circulatory system: Secondary | ICD-10-CM | POA: Diagnosis not present

## 2017-09-08 DIAGNOSIS — A4101 Sepsis due to Methicillin susceptible Staphylococcus aureus: Secondary | ICD-10-CM | POA: Diagnosis not present

## 2017-09-08 DIAGNOSIS — N2581 Secondary hyperparathyroidism of renal origin: Secondary | ICD-10-CM | POA: Diagnosis not present

## 2017-09-08 DIAGNOSIS — I428 Other cardiomyopathies: Secondary | ICD-10-CM | POA: Diagnosis not present

## 2017-09-08 DIAGNOSIS — I509 Heart failure, unspecified: Secondary | ICD-10-CM | POA: Diagnosis not present

## 2017-09-08 DIAGNOSIS — Z992 Dependence on renal dialysis: Secondary | ICD-10-CM | POA: Diagnosis not present

## 2017-09-08 DIAGNOSIS — E1129 Type 2 diabetes mellitus with other diabetic kidney complication: Secondary | ICD-10-CM | POA: Diagnosis not present

## 2017-09-09 ENCOUNTER — Ambulatory Visit (INDEPENDENT_AMBULATORY_CARE_PROVIDER_SITE_OTHER): Payer: Medicare HMO | Admitting: Pharmacist

## 2017-09-09 ENCOUNTER — Emergency Department (HOSPITAL_COMMUNITY)
Admission: EM | Admit: 2017-09-09 | Discharge: 2017-09-09 | Disposition: A | Discharge status: Home / Self Care | Payer: Medicare HMO | Attending: Emergency Medicine | Admitting: Emergency Medicine

## 2017-09-09 ENCOUNTER — Encounter (HOSPITAL_COMMUNITY): Payer: Self-pay

## 2017-09-09 ENCOUNTER — Other Ambulatory Visit: Payer: Self-pay

## 2017-09-09 DIAGNOSIS — R040 Epistaxis: Secondary | ICD-10-CM | POA: Diagnosis not present

## 2017-09-09 DIAGNOSIS — N186 End stage renal disease: Secondary | ICD-10-CM | POA: Insufficient documentation

## 2017-09-09 DIAGNOSIS — I480 Paroxysmal atrial fibrillation: Secondary | ICD-10-CM

## 2017-09-09 DIAGNOSIS — E039 Hypothyroidism, unspecified: Secondary | ICD-10-CM | POA: Diagnosis not present

## 2017-09-09 DIAGNOSIS — Z7289 Other problems related to lifestyle: Secondary | ICD-10-CM | POA: Diagnosis not present

## 2017-09-09 DIAGNOSIS — J3489 Other specified disorders of nose and nasal sinuses: Secondary | ICD-10-CM | POA: Diagnosis not present

## 2017-09-09 DIAGNOSIS — Z7901 Long term (current) use of anticoagulants: Secondary | ICD-10-CM | POA: Insufficient documentation

## 2017-09-09 DIAGNOSIS — I120 Hypertensive chronic kidney disease with stage 5 chronic kidney disease or end stage renal disease: Secondary | ICD-10-CM | POA: Diagnosis not present

## 2017-09-09 DIAGNOSIS — R791 Abnormal coagulation profile: Secondary | ICD-10-CM | POA: Diagnosis not present

## 2017-09-09 DIAGNOSIS — Z79899 Other long term (current) drug therapy: Secondary | ICD-10-CM | POA: Insufficient documentation

## 2017-09-09 DIAGNOSIS — Z992 Dependence on renal dialysis: Secondary | ICD-10-CM | POA: Insufficient documentation

## 2017-09-09 DIAGNOSIS — I4891 Unspecified atrial fibrillation: Secondary | ICD-10-CM | POA: Diagnosis not present

## 2017-09-09 LAB — CBC WITH DIFFERENTIAL/PLATELET
BASOS ABS: 0 10*3/uL (ref 0.0–0.1)
Basophils Relative: 0 %
EOS ABS: 0.1 10*3/uL (ref 0.0–0.7)
EOS PCT: 2 %
HCT: 25.3 % — ABNORMAL LOW (ref 39.0–52.0)
Hemoglobin: 7.8 g/dL — ABNORMAL LOW (ref 13.0–17.0)
Lymphocytes Relative: 12 %
Lymphs Abs: 0.3 10*3/uL — ABNORMAL LOW (ref 0.7–4.0)
MCH: 27.7 pg (ref 26.0–34.0)
MCHC: 30.8 g/dL (ref 30.0–36.0)
MCV: 89.7 fL (ref 78.0–100.0)
Monocytes Absolute: 0.3 10*3/uL (ref 0.1–1.0)
Monocytes Relative: 9 %
Neutro Abs: 2.1 10*3/uL (ref 1.7–7.7)
Neutrophils Relative %: 77 %
PLATELETS: 91 10*3/uL — AB (ref 150–400)
RBC: 2.82 MIL/uL — AB (ref 4.22–5.81)
RDW: 18.3 % — AB (ref 11.5–15.5)
WBC: 2.8 10*3/uL — AB (ref 4.0–10.5)

## 2017-09-09 LAB — COMPREHENSIVE METABOLIC PANEL
ALBUMIN: 3 g/dL — AB (ref 3.5–5.0)
ALT: <5 U/L — ABNORMAL LOW (ref 17–63)
ANION GAP: 14 (ref 5–15)
AST: 15 U/L (ref 15–41)
Alkaline Phosphatase: 104 U/L (ref 38–126)
BILIRUBIN TOTAL: 0.6 mg/dL (ref 0.3–1.2)
BUN: 16 mg/dL (ref 6–20)
CALCIUM: 7.7 mg/dL — AB (ref 8.9–10.3)
CO2: 28 mmol/L (ref 22–32)
Chloride: 89 mmol/L — ABNORMAL LOW (ref 101–111)
Creatinine, Ser: 7.11 mg/dL — ABNORMAL HIGH (ref 0.61–1.24)
GFR calc non Af Amer: 8 mL/min — ABNORMAL LOW (ref >60)
GFR, EST AFRICAN AMERICAN: 9 mL/min — AB (ref >60)
GLUCOSE: 64 mg/dL — AB (ref 65–99)
POTASSIUM: 3.5 mmol/L (ref 3.5–5.1)
SODIUM: 131 mmol/L — AB (ref 135–145)
TOTAL PROTEIN: 7.6 g/dL (ref 6.5–8.1)

## 2017-09-09 LAB — PROTIME-INR
INR: >10
Prothrombin Time: >90 seconds — ABNORMAL HIGH (ref 11.4–15.2)

## 2017-09-09 MED ORDER — PHYTONADIONE 5 MG PO TABS
5.0000 mg | ORAL_TABLET | Freq: Once | ORAL | Status: AC
  Administered 2017-09-09: 5 mg via ORAL
  Filled 2017-09-09: qty 1

## 2017-09-09 MED ORDER — OXYMETAZOLINE HCL 0.05 % NA SOLN
1.0000 | Freq: Once | NASAL | Status: AC
  Administered 2017-09-09: 1 via NASAL
  Filled 2017-09-09: qty 15

## 2017-09-09 NOTE — ED Triage Notes (Signed)
Pt endorses nosebleed since last night, dialysis clinic informed pt that his INR was 10. Pt went to ENT today and told to come here. VSS. Pt on coumadin. Bleeding controlled.

## 2017-09-09 NOTE — ED Provider Notes (Signed)
[facility] EMERGENCY DEPARTMENT Provider Note   CSN: [id number] Arrival date & time: 09/09/17  1439     History   Chief Complaint Chief Complaint  Patient presents with  . Epistaxis    HPI Adam [name]. is a 57 y.o. male.  HPI   57 year old male with a complex medical history listed below, presents with concern for epistaxis and supratherapeutic INR found at dialysis.  Patient was evaluated by Dr. Erik Obey today for his epistaxis, who discussed nasal hygiene, and cleaned out some scar tissue.  No concerns for acute or significant continued epistaxis.  Patient reports that the bleeding at this time has significantly improved.  He does not have any packing placed.  Reports that the nosebleeding had started yesterday after he had blown his nose, and at times will be better controlled than others.  Denies any other significant bleeding, including no black or bloody stools.  He has a wound VAC but denies any significant bleeding from his wound VAC.  Denies any history of head trauma.  Denies hematemesis.  Reports that he feels fatigued, as if his anemia is worse than it had been.  Was supposed to have dialysis today, but given his subtherapeutic INR was sent here for further evaluation.  Past Medical History:  Diagnosis Date  . AICD (automatic cardioverter/defibrillator) present    Medtronic   . Aortic stenosis    moderate AS by 08/2016 echo  . Arthritis    "knees" (08/05/2016)  . Atrial fibrillation ([place])   . ESRD (end stage renal disease) on dialysis ([place])    "M, T, W, T, F; NX stage hemodialysis; I do it at home" (08/05/2016)  . Great toe amputation status ([place])    status post left hallux amputation 08/01/14  . History of blood transfusion 2009   "S/P biopsy for prostate cancer check"  . Hypertension   . Hypothyroidism (acquired)   . Mitral stenosis    moderate mitral stenosis  . Nonischemic cardiomyopathy ([place])   . Pneumonia 2014; 08/05/2016  . PONV  (postoperative nausea and vomiting)   . Pulmonary HTN ([place])   . Renal insufficiency   . Thyroid cancer [facility]) 2011    Patient Active Problem List   Diagnosis Date Noted  . Acute encephalopathy   . Hypotension   . Complication of arteriovenous dialysis fistula   . Stage 5 chronic kidney disease on chronic dialysis ([place])   . 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 MSSA bacteremia 08/18/2017  . Sepsis ([place]) 08/17/2017  . Lymphadenopathy, mediastinal 04/10/2017  . Hypothyroidism 10/09/2016  . Acute respiratory failure with hypoxia ([place]) 08/06/2016  . HCAP (healthcare-associated pneumonia) 08/05/2016  . Thyroid cancer ([place]) 06/12/2016  . Epistaxis 03/04/2016  . Long term current use of amiodarone 01/10/2016  . Critical lower limb ischemia 12/04/2015  . Carotid artery stenosis 05/24/2015  . Excess skin 05/24/2015  . Osteomyelitis of left foot ([place]) 04/30/2015  . Status post amputation 04/19/2015  . Wound dehiscence 04/19/2015  . Sinus congestion 03/08/2015  . Bleeding pseudoaneurysm of left brachiocephalic arteriovenous fistula ([place]) 12/24/2014  . Pure hypercholesterolemia 11/09/2014  . History of thyroid cancer 11/07/2014  . Obesity 11/07/2014  . Healthcare maintenance 11/07/2014  . H/O malignant neoplasm of thyroid 11/07/2014  . Toe amputation status ([place]) 10/13/2014  . ICD (implantable cardioverter-defibrillator) in place 07/25/2014  . ESRD (end stage renal disease) on hemodialysis 07/25/2014  . End-stage renal disease ([place]) 07/25/2014  . Automatic implantable cardioverter-defibrillator in situ 07/25/2014  .  Pulmonary hypertension ([place]) July 22, 202016  . Secondary pulmonary hypertension July 22, 202016  . Atrial fibrillation [I48.91] 05/24/2014  . Long-term (current) use of anticoagulants 05/24/2014    Past Surgical History:  Procedure Laterality Date  . ARTERIAL LINE INSERTION Right 08/24/2017   Procedure: ARTERIAL LINE INSERTION INTO RIGHT FEMORAL ARTERY;  Surgeon: Conrad [place], MD;   Location: [facility];  Service: Vascular;  Laterality: Right;  . CARDIAC CATHETERIZATION Right 2017  . CARDIAC DEFIBRILLATOR PLACEMENT  2009  . DIALYSIS FISTULA CREATION Left 2003   "upper arm"  . DIALYSIS/PERMA CATHETER REMOVAL Left 09/01/2017   Procedure: DIALYSIS/PERMA CATHETER REMOVAL;  Surgeon: Conrad [place]etta, MD;  Location: [facility];  Service: Vascular;  Laterality: Left;  . HERNIA REPAIR    . ICD LEAD REMOVAL Right 08/20/2017   Procedure: ICD EXTRACTION WITH C-ARM;  Surgeon: Evans Lance, MD;  Location: [facility] OR;  Service: Cardiovascular;  Laterality: Right;  DR VAN TRIGT TO BACK-UP  . INSERTION OF DIALYSIS CATHETER N/A 11/11/2016   Procedure: INSERTION OF TUNNELED DIALYSIS CATHETER;  Surgeon: Angelia Mould, MD;  Location: [place];  Service: Vascular;  Laterality: N/A;  . INSERTION OF DIALYSIS CATHETER Left 08/24/2017   Procedure: INSERTION OF DIALYSIS CATHETER;  Surgeon: Conrad Sobieski, MD;  Location: [facility];  Service: Vascular;  Laterality: Left;  . INSERTION OF DIALYSIS CATHETER Left 09/01/2017   Procedure: INSERTION OF DIALYSIS CATHETER;  Surgeon: Conrad [place], MD;  Location: [place];  Service: Vascular;  Laterality: Left;  . KIDNEY TRANSPLANT  2009  . KNEE SURGERY Bilateral 2000s   "drained fluid"  . LAPAROSCOPIC GASTRIC BANDING  2006  . PROSTATE BIOPSY  2009  . REVISON OF ARTERIOVENOUS FISTULA Left 12/20/9145   Procedure: PLICATION OF ARTERIOVENOUS FISTULA ANEURYSM;  Surgeon: Elam Dutch, MD;  Location: [facility];  Service: Vascular;  Laterality: Left;  . REVISON OF ARTERIOVENOUS FISTULA Left 11/10/2016   Procedure: REVISON OF ARTERIOVENOUS FISTULA - LEFT UPPER ARM;  Surgeon: Elam Dutch, MD;  Location: [facility];  Service: Vascular;  Laterality: Left;  . REVISON OF ARTERIOVENOUS FISTULA Left 08/24/2017   Procedure: REVISON OF ARTERIOVENOUS FISTULA LEFT;  Surgeon: Conrad [place], MD;  Location: [facility];  Service: Vascular;  Laterality: Left;  . TEE WITHOUT CARDIOVERSION N/A 08/20/2017    Procedure: TRANSESOPHAGEAL ECHOCARDIOGRAM (TEE);  Surgeon: Evans Lance, MD;  Location: [facility];  Service: Cardiovascular;  Laterality: N/A;  . THROMBECTOMY W/ EMBOLECTOMY Left 12/25/2014   Procedure: THROMBECTOMY ARTERIOVENOUS FISTULA;  Surgeon: Elam Dutch, MD;  Location: [place];  Service: Vascular;  Laterality: Left;  . THYROIDECTOMY  2011  . TOE AMPUTATION Bilateral     right 1st and  2nd digits; left 1st and 3rd digits"  . UMBILICAL HERNIA REPAIR  X 2  . VEIN HARVEST Left 08/24/2017   Procedure: VEIN HARVEST;  Surgeon: Conrad McDuffie, MD;  Location: [facility] OR;  Service: Vascular;  Laterality: Left;        Home Medications    Prior to Admission medications   Medication Sig Start Date End Date Taking? Authorizing Provider  ambrisentan (LETAIRIS) 10 MG tablet Take 1 tablet (10 mg total) by mouth daily. 01/16/17  Yes Juanito Doom, MD  amiodarone (PACERONE) 200 MG tablet Take 200 mg by mouth every evening.    Yes [provider]  atorvastatin (LIPITOR) 40 MG tablet Take 1 tablet (40 mg total) by mouth every evening. 06/15/17  Yes Diallo, Earna Coder, MD  ceFAZolin (ANCEF) IVPB Inject  2 g into the vein every dialysis (GIVE AT END OF EACH HD SESSION (MWF schedule)). Indication: MSSA Bacteremia Last Day of Therapy: 10/05/17 Labs - Once weekly:  CBC/D and BMP, Labs - Every other week:  ESR and CRP 08/28/17 10/05/17 Yes Shah, Pratik D, DO  cinacalcet (SENSIPAR) 90 MG tablet Take 90 mg by mouth every evening.    Yes [provider]  Dextromethorphan HBr (VICKS DAYQUIL COUGH PO) Take 1 tablet by mouth daily.   Yes [provider]  levothyroxine (SYNTHROID, LEVOTHROID) 300 MCG tablet Take 1 tablet (300 mcg total) by mouth daily. 06/09/17  Yes Renato Shin, MD  midodrine (PROAMATINE) 10 MG tablet Take 1 tablet (10 mg total) by mouth 3 (three) times daily. 09/02/16  Yes Minus Breeding, MD  tadalafil, PAH, (ADCIRCA) 20 MG tablet 1 tab daily Patient taking differently: Take 20  mg by mouth daily.  12/05/16  Yes Juanito Doom, MD  warfarin (COUMADIN) 6 MG tablet Take 1 tablet (6 mg total) by mouth one time only at 6 PM. Patient taking differently: Take 6-9 mg by mouth See admin instructions. Take '6mg'$  on Sundays and '9mg'$  Monday through Saturday. 08/28/17 09/27/17 Yes Manuella Ghazi, Pratik D, DO    Family History Family History  Problem Relation Age of Onset  . Heart failure Mother   . Hypertension Mother   . CAD Mother 50  . Emphysema Mother   . Hypertension Father   . Kidney failure Father     Social History Social History   Tobacco Use  . Smoking status: Never Smoker  . Smokeless tobacco: Never Used  Substance Use Topics  . Alcohol use: Yes    Alcohol/week: 0.0 oz    Comment: 08/05/2016 "might have 1 shot/month of liquor"  . Drug use: No     Allergies   Enalaprilat; Vasotec [enalapril]; and Iodinated diagnostic agents   Review of Systems Review of Systems  Constitutional: Positive for fatigue. Negative for fever.  HENT: Positive for nosebleeds. Negative for sore throat.   Eyes: Negative for visual disturbance.  Respiratory: Positive for shortness of breath.   Cardiovascular: Negative for chest pain.  Gastrointestinal: Negative for abdominal pain, nausea and vomiting.  Genitourinary: Negative for difficulty urinating.  Musculoskeletal: Negative for back pain and neck stiffness.  Skin: Negative for rash.  Neurological: Positive for light-headedness. Negative for syncope and headaches.     Physical Exam Updated Vital Signs BP 105/75   Pulse 77   Temp 97.8 F (36.6 C) (Oral)   Resp 16   Ht '6\' 1"'$  (1.854 m)   Wt 123.4 kg (272 lb)   SpO2 99%   BMI 35.89 kg/m   Physical Exam  Constitutional: He is oriented to person, place, and time. He appears well-developed and well-nourished. No distress.  HENT:  Head: Normocephalic and atraumatic.  Prior epistaxis, dried blood right nares  Eyes: Conjunctivae and EOM are normal.  Neck: Normal range of  motion.  Cardiovascular: Normal rate, regular rhythm and intact distal pulses. Exam reveals no gallop and no friction rub.  Murmur heard. Pulmonary/Chest: Effort normal and breath sounds normal. No respiratory distress. He has no wheezes. He has no rales.  Abdominal: Soft. He exhibits no distension. There is no tenderness. There is no guarding.  Musculoskeletal: He exhibits no edema.  Neurological: He is alert and oriented to person, place, and time.  Skin: Skin is warm and dry. He is not diaphoretic.  Nursing note and vitals reviewed.    ED Treatments /  Results  Labs (all labs ordered are listed, but only abnormal results are displayed) Labs Reviewed  CBC WITH DIFFERENTIAL/PLATELET - Abnormal; Notable for the following components:      Result Value   WBC 2.8 (*)    RBC 2.82 (*)    Hemoglobin 7.8 (*)    HCT 25.3 (*)    RDW 18.3 (*)    Platelets 91 (*)    Lymphs Abs 0.3 (*)    All other components within normal limits  PROTIME-INR - Abnormal; Notable for the following components:   Prothrombin Time >90.0 (*)    INR >10.00 (*)    All other components within normal limits  COMPREHENSIVE METABOLIC PANEL - Abnormal; Notable for the following components:   Sodium 131 (*)    Chloride 89 (*)    Glucose, Bld 64 (*)    Creatinine, Ser 7.11 (*)    Calcium 7.7 (*)    Albumin 3.0 (*)    ALT <5 (*)    GFR calc non Af Amer 8 (*)    GFR calc Af Amer 9 (*)    All other components within normal limits    EKG None  Radiology No results found.  Procedures Procedures (including critical care time)  Medications Ordered in ED Medications  phytonadione (VITAMIN K) tablet 5 mg (5 mg Oral Given 09/09/17 1958)  oxymetazoline (AFRIN) 0.05 % nasal spray 1 spray (1 spray Each Nare Given 09/09/17 2117)     Initial Impression / Assessment and Plan / ED Course  I have reviewed the triage vital signs and the nursing notes.  Pertinent labs & imaging results that were available during my care  of the patient were reviewed by me and considered in my medical decision making (see chart for details).     57 year old male with complicated history listed above, including history of dialysis Monday Wednesday Friday, A. fib and AS on Coumadin, presents with concern for epistaxis and supratherapeutic INR.  On my evaluation, patient's epistaxis has resolved, and he is hemostatic.  Call Dr. Erik Obey ENT to discuss the patient, who also agree that he required no other acute interventions regarding his epistaxis and had recommended nasal hygiene.  Patient does not have any other signs of life-threatening bleeding by history, or exam.  He does not have sick any significant or increased bleeding from his wound VAC.  Labs are repeated in the emergency department showing INR greater than 10, and hemoglobin of 7.8.  Pt in ED without epistaxis through many hours of observation.  Just prior to discharge, developed minimal bleeding, given afrin and pressure placed.   Discussed that risks of reversal agents in setting of minimal bleeding from nose outweigh the benefits and that given no sign of life threatening bleeding present, recommend oral vitamin K, holding coumadin, and close outpatient follow up.  Patient has had previous similar anemia to 7s in the past, not significantly symptomatic, do not feel he requires further admission for this in setting of no significant continued bleeding.  No sign of needing acute dialysis today.  Given '5mg'$  po vitamin K, discussed reasons to return. Patient discharged in stable condition with understanding of reasons to return.    Final Clinical Impressions(s) / ED Diagnoses   Final diagnoses:  Epistaxis  Supratherapeutic INR    ED Discharge Orders    None       Gareth Morgan, MD 09/10/17 [phone]

## 2017-09-09 NOTE — ED Notes (Signed)
Type and screen drawn and held at bedside. Label applied and verified with Corrie Dandy.

## 2017-09-09 NOTE — ED Notes (Signed)
Pt has a wound vac to his left arm. Pt also has on an external defibrillator on.

## 2017-09-09 NOTE — ED Notes (Signed)
This RN called lab, they state that they have the tubes and will be running them soon.

## 2017-09-09 NOTE — Discharge Instructions (Signed)
DO NOT TAKE YOUR COUMADIN UNTIL YOU ARE DIRECTED BY PHYSICIAN

## 2017-09-09 NOTE — ED Notes (Signed)
Dr. Schlossman at bedside. 

## 2017-09-10 ENCOUNTER — Ambulatory Visit (INDEPENDENT_AMBULATORY_CARE_PROVIDER_SITE_OTHER): Payer: Medicare HMO | Admitting: Pharmacist Clinician (PhC)/ Clinical Pharmacy Specialist

## 2017-09-10 ENCOUNTER — Encounter: Payer: Self-pay | Admitting: Cardiology

## 2017-09-10 DIAGNOSIS — D631 Anemia in chronic kidney disease: Secondary | ICD-10-CM | POA: Diagnosis not present

## 2017-09-10 DIAGNOSIS — Z7901 Long term (current) use of anticoagulants: Secondary | ICD-10-CM

## 2017-09-10 DIAGNOSIS — I480 Paroxysmal atrial fibrillation: Secondary | ICD-10-CM | POA: Diagnosis not present

## 2017-09-10 DIAGNOSIS — I428 Other cardiomyopathies: Secondary | ICD-10-CM | POA: Diagnosis not present

## 2017-09-10 DIAGNOSIS — N2581 Secondary hyperparathyroidism of renal origin: Secondary | ICD-10-CM | POA: Diagnosis not present

## 2017-09-10 DIAGNOSIS — I509 Heart failure, unspecified: Secondary | ICD-10-CM | POA: Diagnosis not present

## 2017-09-10 DIAGNOSIS — T827XXD Infection and inflammatory reaction due to other cardiac and vascular devices, implants and grafts, subsequent encounter: Secondary | ICD-10-CM | POA: Diagnosis not present

## 2017-09-10 DIAGNOSIS — J9601 Acute respiratory failure with hypoxia: Secondary | ICD-10-CM | POA: Diagnosis not present

## 2017-09-10 DIAGNOSIS — N186 End stage renal disease: Secondary | ICD-10-CM | POA: Diagnosis not present

## 2017-09-10 DIAGNOSIS — I132 Hypertensive heart and chronic kidney disease with heart failure and with stage 5 chronic kidney disease, or end stage renal disease: Secondary | ICD-10-CM | POA: Diagnosis not present

## 2017-09-10 DIAGNOSIS — Z48812 Encounter for surgical aftercare following surgery on the circulatory system: Secondary | ICD-10-CM | POA: Diagnosis not present

## 2017-09-10 DIAGNOSIS — A4101 Sepsis due to Methicillin susceptible Staphylococcus aureus: Secondary | ICD-10-CM | POA: Diagnosis not present

## 2017-09-10 LAB — POCT INR: INR: >8

## 2017-09-10 NOTE — Patient Instructions (Signed)
Description   Hold warfarin today, encouraged to eat vitamin k diet.  Repeat INR tomorrow

## 2017-09-11 ENCOUNTER — Ambulatory Visit (INDEPENDENT_AMBULATORY_CARE_PROVIDER_SITE_OTHER): Payer: Medicare HMO | Admitting: Pharmacist Clinician (PhC)/ Clinical Pharmacy Specialist

## 2017-09-11 ENCOUNTER — Other Ambulatory Visit: Payer: Self-pay

## 2017-09-11 DIAGNOSIS — I480 Paroxysmal atrial fibrillation: Secondary | ICD-10-CM

## 2017-09-11 DIAGNOSIS — Z7901 Long term (current) use of anticoagulants: Secondary | ICD-10-CM

## 2017-09-11 DIAGNOSIS — N186 End stage renal disease: Secondary | ICD-10-CM | POA: Diagnosis not present

## 2017-09-11 DIAGNOSIS — D631 Anemia in chronic kidney disease: Secondary | ICD-10-CM | POA: Diagnosis not present

## 2017-09-11 DIAGNOSIS — I4891 Unspecified atrial fibrillation: Secondary | ICD-10-CM | POA: Diagnosis not present

## 2017-09-11 DIAGNOSIS — A4101 Sepsis due to Methicillin susceptible Staphylococcus aureus: Secondary | ICD-10-CM | POA: Diagnosis not present

## 2017-09-11 LAB — PROTIME-INR
INR: 8.3 — AB (ref 0.8–1.2)
Prothrombin Time: 87.9 s — ABNORMAL HIGH (ref 9.1–12.0)

## 2017-09-11 LAB — POCT INR: INR: >8

## 2017-09-11 MED ORDER — AMBRISENTAN 10 MG PO TABS: 10.0000 mg | ORAL_TABLET | Freq: Every day | ORAL | 11 refills | Status: AC

## 2017-09-11 NOTE — Patient Instructions (Signed)
Description   Hold warfarin until further notice..  Repeat INR tomorrow with home health RN.  Patient aware to return to ED should bleeding worsen.   [facility] RN please draw pt/INR for Jv[place] on Saturday May 4 - have the results called in to our answering service.

## 2017-09-12 DIAGNOSIS — N186 End stage renal disease: Secondary | ICD-10-CM | POA: Diagnosis not present

## 2017-09-12 DIAGNOSIS — I132 Hypertensive heart and chronic kidney disease with heart failure and with stage 5 chronic kidney disease, or end stage renal disease: Secondary | ICD-10-CM | POA: Diagnosis not present

## 2017-09-12 DIAGNOSIS — D631 Anemia in chronic kidney disease: Secondary | ICD-10-CM | POA: Diagnosis not present

## 2017-09-12 DIAGNOSIS — I428 Other cardiomyopathies: Secondary | ICD-10-CM | POA: Diagnosis not present

## 2017-09-12 DIAGNOSIS — T827XXD Infection and inflammatory reaction due to other cardiac and vascular devices, implants and grafts, subsequent encounter: Secondary | ICD-10-CM | POA: Diagnosis not present

## 2017-09-12 DIAGNOSIS — A4101 Sepsis due to Methicillin susceptible Staphylococcus aureus: Secondary | ICD-10-CM | POA: Diagnosis not present

## 2017-09-12 DIAGNOSIS — Z48812 Encounter for surgical aftercare following surgery on the circulatory system: Secondary | ICD-10-CM | POA: Diagnosis not present

## 2017-09-12 DIAGNOSIS — I509 Heart failure, unspecified: Secondary | ICD-10-CM | POA: Diagnosis not present

## 2017-09-12 DIAGNOSIS — N2581 Secondary hyperparathyroidism of renal origin: Secondary | ICD-10-CM | POA: Diagnosis not present

## 2017-09-12 DIAGNOSIS — J9601 Acute respiratory failure with hypoxia: Secondary | ICD-10-CM | POA: Diagnosis not present

## 2017-09-13 ENCOUNTER — Other Ambulatory Visit: Payer: Self-pay | Admitting: Physician Assistant

## 2017-09-13 NOTE — Progress Notes (Signed)
Per Dr. Rosezella [place] instructions, called in 5 mg PO vitamin K (phytonadione) dispense 2 tablets, take one tonight and await further instructions tomorrow. Called patient and instructed him to go to [facility] on [address].

## 2017-09-14 ENCOUNTER — Ambulatory Visit (INDEPENDENT_AMBULATORY_CARE_PROVIDER_SITE_OTHER): Payer: Medicare HMO | Admitting: Pharmacist Clinician (PhC)/ Clinical Pharmacy Specialist

## 2017-09-14 DIAGNOSIS — I480 Paroxysmal atrial fibrillation: Secondary | ICD-10-CM

## 2017-09-14 DIAGNOSIS — I132 Hypertensive heart and chronic kidney disease with heart failure and with stage 5 chronic kidney disease, or end stage renal disease: Secondary | ICD-10-CM | POA: Diagnosis not present

## 2017-09-14 DIAGNOSIS — N186 End stage renal disease: Secondary | ICD-10-CM | POA: Diagnosis not present

## 2017-09-14 DIAGNOSIS — J9601 Acute respiratory failure with hypoxia: Secondary | ICD-10-CM | POA: Diagnosis not present

## 2017-09-14 DIAGNOSIS — T827XXD Infection and inflammatory reaction due to other cardiac and vascular devices, implants and grafts, subsequent encounter: Secondary | ICD-10-CM | POA: Diagnosis not present

## 2017-09-14 DIAGNOSIS — I428 Other cardiomyopathies: Secondary | ICD-10-CM | POA: Diagnosis not present

## 2017-09-14 DIAGNOSIS — Z48812 Encounter for surgical aftercare following surgery on the circulatory system: Secondary | ICD-10-CM | POA: Diagnosis not present

## 2017-09-14 DIAGNOSIS — I509 Heart failure, unspecified: Secondary | ICD-10-CM | POA: Diagnosis not present

## 2017-09-14 DIAGNOSIS — Z7901 Long term (current) use of anticoagulants: Secondary | ICD-10-CM

## 2017-09-14 DIAGNOSIS — A4101 Sepsis due to Methicillin susceptible Staphylococcus aureus: Secondary | ICD-10-CM | POA: Diagnosis not present

## 2017-09-14 DIAGNOSIS — D631 Anemia in chronic kidney disease: Secondary | ICD-10-CM | POA: Diagnosis not present

## 2017-09-14 DIAGNOSIS — N2581 Secondary hyperparathyroidism of renal origin: Secondary | ICD-10-CM | POA: Diagnosis not present

## 2017-09-14 LAB — POCT INR: INR: 3.5

## 2017-09-14 NOTE — Patient Instructions (Signed)
Description   No warfarin today Monday May 6, then take 6 mg warfarin Tuesday May 7.  Repeat INR Wednesday  [facility] RN please draw pt/INR for Adam [name] on Wednesday May 8 - have the results called in to our office [phone] (direct line to coumadin clinic)

## 2017-09-15 DIAGNOSIS — N186 End stage renal disease: Secondary | ICD-10-CM | POA: Diagnosis not present

## 2017-09-15 DIAGNOSIS — N2581 Secondary hyperparathyroidism of renal origin: Secondary | ICD-10-CM | POA: Diagnosis not present

## 2017-09-16 ENCOUNTER — Encounter: Payer: Self-pay | Admitting: Family Medicine

## 2017-09-16 ENCOUNTER — Ambulatory Visit (INDEPENDENT_AMBULATORY_CARE_PROVIDER_SITE_OTHER): Payer: Medicare HMO | Admitting: Family Medicine

## 2017-09-16 ENCOUNTER — Other Ambulatory Visit: Payer: Self-pay

## 2017-09-16 VITALS — BP 92/58 | HR 80 | Temp 97.5°F | Wt 278.0 lb

## 2017-09-16 DIAGNOSIS — Z79899 Other long term (current) drug therapy: Secondary | ICD-10-CM

## 2017-09-16 DIAGNOSIS — R197 Diarrhea, unspecified: Secondary | ICD-10-CM

## 2017-09-16 DIAGNOSIS — N186 End stage renal disease: Secondary | ICD-10-CM | POA: Diagnosis not present

## 2017-09-16 DIAGNOSIS — R7881 Bacteremia: Secondary | ICD-10-CM

## 2017-09-16 DIAGNOSIS — D649 Anemia, unspecified: Secondary | ICD-10-CM

## 2017-09-16 DIAGNOSIS — K921 Melena: Secondary | ICD-10-CM | POA: Diagnosis not present

## 2017-09-16 DIAGNOSIS — R739 Hyperglycemia, unspecified: Secondary | ICD-10-CM

## 2017-09-16 DIAGNOSIS — I480 Paroxysmal atrial fibrillation: Secondary | ICD-10-CM

## 2017-09-16 DIAGNOSIS — Z95810 Presence of automatic (implantable) cardiac defibrillator: Secondary | ICD-10-CM | POA: Diagnosis not present

## 2017-09-16 DIAGNOSIS — A419 Sepsis, unspecified organism: Secondary | ICD-10-CM | POA: Diagnosis not present

## 2017-09-16 DIAGNOSIS — Z7901 Long term (current) use of anticoagulants: Secondary | ICD-10-CM

## 2017-09-16 DIAGNOSIS — Z1211 Encounter for screening for malignant neoplasm of colon: Secondary | ICD-10-CM | POA: Diagnosis not present

## 2017-09-16 DIAGNOSIS — T8130XA Disruption of wound, unspecified, initial encounter: Secondary | ICD-10-CM | POA: Diagnosis not present

## 2017-09-16 DIAGNOSIS — B9561 Methicillin susceptible Staphylococcus aureus infection as the cause of diseases classified elsewhere: Secondary | ICD-10-CM

## 2017-09-16 DIAGNOSIS — J9601 Acute respiratory failure with hypoxia: Secondary | ICD-10-CM | POA: Diagnosis not present

## 2017-09-16 DIAGNOSIS — T8189XA Other complications of procedures, not elsewhere classified, initial encounter: Secondary | ICD-10-CM | POA: Diagnosis not present

## 2017-09-16 DIAGNOSIS — T829XXA Unspecified complication of cardiac and vascular prosthetic device, implant and graft, initial encounter: Secondary | ICD-10-CM | POA: Diagnosis not present

## 2017-09-16 LAB — POCT INR: INR: 3.8

## 2017-09-16 LAB — POCT GLYCOSYLATED HEMOGLOBIN (HGB A1C): Hemoglobin A1C: 4.5

## 2017-09-16 NOTE — Assessment & Plan Note (Signed)
Patient had AICD removed in the setting of MSSA bacteremia. Continue to be on coumadin and amiodarone. Patient has an appointment with Dr. Sallyanne Kuster on 5/13 to discuss AICD replacement. In the meantime, will have to adjust coumadin dose given elevated INR likely secondary to amiodarone.

## 2017-09-16 NOTE — Assessment & Plan Note (Signed)
Currently on Ancef with dialysis. Will complete 6 weeks course on 5/20. --will continue to monitor.

## 2017-09-16 NOTE — Progress Notes (Signed)
Subjective:    Patient ID: Adam [name]., male    DOB: Feb 09, 1961, 57 y.o.   MRN: [id number]   CC: Hospital follow up for bacteremia  HPI: Patient is a 57 yo male with a past medical history significant ESRD on dialysis, atrial fibrillation, HLD, hypothyroidism who presents today to follow up on recent hospitalization for MSSA bacteremia from AV fistula. Patient also had his AICD removed from bacterial seeding. Patient was discharged from the hospital on 4/19. Patient is currently on Ancef with dialysis. Patient had a left IJ tunnel dialysis catheter placed by Dr.Chen (Vascular) on 4/23 with bilateral jugular vein cannulation under US guidance. Patient also had elevated INR, greater than 8 and is being monitor at the coumadin clinic. Patient recently had an episode of epitaxis for which he was seen in the ED an given Vit K. Patient reports epistaxis has resolved and last INR check was 3.5.  Patient also reports multiple episodes of diarrhea/loose stools since discharge from the hospital about 4 episodes a day. He reports stools are very dark and "mud" appearing. Patient last hemoglobin was 7.8 (5/1). He reports feeling a bit tired but overall is feeling better every day. He as resume all his medications and is titrating his coumadin based on his INR with the help of the coumadin clinic. Patient currently denies any shortness of breath, chest pain, abdominal pain, vomiting, nausea, headaches, palpitations or headaches.  Smoking status reviewed   ROS: all other systems were reviewed and are negative other than in the HPI   Past Medical History:  Diagnosis Date  . AICD (automatic cardioverter/defibrillator) present    [employer]   . Aortic stenosis    moderate AS by 08/2016 echo  . Arthritis    "knees" (08/05/2016)  . Atrial fibrillation ([place])   . ESRD (end stage renal disease) on dialysis ([place])    "M, T, W, T, F; NX stage hemodialysis; I do it at home" (08/05/2016)  . Great toe  amputation status ([place])    status post left hallux amputation 08/01/14  . History of blood transfusion 2009   "S/P biopsy for prostate cancer check"  . Hypertension   . Hypothyroidism (acquired)   . Mitral stenosis    moderate mitral stenosis  . Nonischemic cardiomyopathy ([place])   . Pneumonia 2014; 08/05/2016  . PONV (postoperative nausea and vomiting)   . Pulmonary HTN ([place])   . Renal insufficiency   . Thyroid cancer ([place]) 2011    Past Surgical History:  Procedure Laterality Date  . ARTERIAL LINE INSERTION Right 08/24/2017   Procedure: ARTERIAL LINE INSERTION INTO RIGHT FEMORAL ARTERY;  Surgeon: Conrad Henning, MD;  Location: [place];  Service: Vascular;  Laterality: Right;  . CARDIAC CATHETERIZATION Right 2017  . CARDIAC DEFIBRILLATOR PLACEMENT  2009  . DIALYSIS FISTULA CREATION Left 2003   "upper arm"  . DIALYSIS/PERMA CATHETER REMOVAL Left 09/01/2017   Procedure: DIALYSIS/PERMA CATHETER REMOVAL;  Surgeon: Conrad Gorman, MD;  Location: [place];  Service: Vascular;  Laterality: Left;  . HERNIA REPAIR    . ICD LEAD REMOVAL Right 08/20/2017   Procedure: ICD EXTRACTION WITH C-ARM;  Surgeon: Evans Lance, MD;  Location: [facility] OR;  Service: Cardiovascular;  Laterality: Right;  DR VAN TRIGT TO BACK-UP  . INSERTION OF DIALYSIS CATHETER N/A 11/11/2016   Procedure: INSERTION OF TUNNELED DIALYSIS CATHETER;  Surgeon: Angelia Mould, MD;  Location[facility];  Service: Vascular;  Laterality: N/A;  . INSERTION OF DIALYSIS CATHETER  Left 08/24/2017   Procedure: INSERTION OF DIALYSIS CATHETER;  Surgeon: Conrad Dozier, MD;  Location: [place];  Service: Vascular;  Laterality: Left;  . INSERTION OF DIALYSIS CATHETER Left 09/01/2017   Procedure: INSERTION OF DIALYSIS CATHETER;  Surgeon: Conrad McCracken, MD;  Location: [place];  Service: Vascular;  Laterality: Left;  . KIDNEY TRANSPLANT  2009  . KNEE SURGERY Bilateral 2000s   "drained fluid"  . LAPAROSCOPIC GASTRIC BANDING  2006  . PROSTATE BIOPSY  2009  .  REVISON OF ARTERIOVENOUS FISTULA Left 08/27/4079   Procedure: PLICATION OF ARTERIOVENOUS FISTULA ANEURYSM;  Surgeon: Elam Dutch, MD;  Location: [place];  Service: Vascular;  Laterality: Left;  . REVISON OF ARTERIOVENOUS FISTULA Left 11/10/2016   Procedure: REVISON OF ARTERIOVENOUS FISTULA - LEFT UPPER ARM;  Surgeon: Elam Dutch, MD;  Location: [place];  Service: Vascular;  Laterality: Left;  . REVISON OF ARTERIOVENOUS FISTULA Left 08/24/2017   Procedure: REVISON OF ARTERIOVENOUS FISTULA LEFT;  Surgeon: Conrad Cragsmoor, MD;  Location: [place];  Service: Vascular;  Laterality: Left;  . TEE WITHOUT CARDIOVERSION N/A 08/20/2017   Procedure: TRANSESOPHAGEAL ECHOCARDIOGRAM (TEE);  Surgeon: Evans Lance, MD;  Location: [place];  Service: Cardiovascular;  Laterality: N/A;  . THROMBECTOMY W/ EMBOLECTOMY Left 12/25/2014   Procedure: THROMBECTOMY ARTERIOVENOUS FISTULA;  Surgeon: Elam Dutch, MD;  Location: [place];  Service: Vascular;  Laterality: Left;  . THYROIDECTOMY  2011  . TOE AMPUTATION Bilateral     right 1st and  2nd digits; left 1st and 3rd digits"  . UMBILICAL HERNIA REPAIR  X 2  . VEIN HARVEST Left 08/24/2017   Procedure: VEIN HARVEST;  Surgeon: Conrad Sawyer, MD;  Location[facility] OR;  Service: Vascular;  Laterality: Left;    Past medical history, surgical, family, and social history reviewed and updated in the EMR as appropriate.  Objective:  BP (!) 92/58   Pulse 80   Temp (!) 97.5 F (36.4 C) (Oral)   Wt 278 lb (126.1 kg)   SpO2 98%   BMI 36.68 kg/m   Vitals and nursing note reviewed  General: NAD, pleasant, able to participate in exam Cardiac: RRR, normal heart sounds, no murmurs. 2+ radial and PT pulses bilaterally Respiratory: CTAB, normal effort, No wheezes, rales or rhonchi Abdomen: soft, nontender, nondistended, no hepatic or splenomegaly, +BS Extremities: no edema or cyanosis. WWP. Skin: warm and dry, no rashes noted Neuro: alert and oriented x4, no focal  deficits Psych: Normal affect and mood   Assessment & Plan:    Anemia Last hemoglobin 7.8 on 5/1 down from 9.2 on 4/23. On discharge hemoglobin 7.6. Patient's INR was >8 recently with multiple days of epitaxis. Patient also reporting dark tarry stools likely GI bleed in the setting of elevated INR. Anemia likely secondary to recent epistaxis and possible GI bleed. Patient is also ESRD on dialysis and has a component of chronic anemia. Patient is receiving aranesp at dialysis. POCT INR today is 3.8, will continue to titrate. --CBC with Diff --Will follow up on results  Diarrhea Patient reports multiples episode of diarrhea/ loose stools a day since being discharge from the hospital. Patient is a 6 week course of Ancef which could causing symptoms. Patient also reported dark tarry stool which is highly suspicious for GI bleed in the settings of elevated INR which could also be causing increase BM. Patient will complete ancef course on 5/20. However, given symptoms there is concern for C.diff. --Will order outpatient C.diff  work up --Will follow up on result   MSSA bacteremia Currently on Ancef with dialysis. Will complete 6 weeks course on 5/20. --will continue to monitor.  Atrial fibrillation [I48.91] Patient had AICD removed in the setting of MSSA bacteremia. Continue to be on coumadin and amiodarone. Patient has an appointment with Dr. Sallyanne Kuster on 5/13 to discuss AICD replacement. In the meantime, will have to adjust coumadin dose given elevated INR likely secondary to amiodarone.  Tarry stools Likely secondary to elevated INR ( >8). Last colonoscopy 2009. Will refer to GI for colonoscopy. Suspect bleeding will improve as INR corrects to normal range.  --Ambulatory referral to GI    [name], MD [place] PGY-2

## 2017-09-16 NOTE — Assessment & Plan Note (Signed)
Patient reports multiples episode of diarrhea/ loose stools a day since being discharge from the hospital. Patient is a 6 week course of Ancef which could causing symptoms. Patient also reported dark tarry stool which is highly suspicious for GI bleed in the settings of elevated INR which could also be causing increase BM. Patient will complete ancef course on 5/20. However, given symptoms there is concern for C.diff. --Will order outpatient C.diff work up --Will follow up on result

## 2017-09-16 NOTE — Patient Instructions (Signed)
It was great seeing you today! We have addressed the following issues today  1. I will check your Hemoglobin, and A1c today.  2. I made a referral to GI for colonoscopy. 3. I will also check for C.Difficile infection which can cause diarrhea. 4. Please make an appointment to see me after your cardiology appointment. 5. I will follow up on the results of your bloodwork.  If we did any lab work today, and the results require attention, either me or my nurse will get in touch with you. If everything is normal, you will get a letter in mail and a message via . If you don't hear from [place] in two weeks, please give [place] a call. Otherwise, we look forward to seeing you again at your next visit. If you have any questions or concerns before then, please call the clinic at [phone].  Please bring all your medications to every doctors visit  Sign up for My Chart to have easy access to your labs results, and communication with your Primary care physician. Please ask Front Desk for some assistance.   Please check-out at the front desk before leaving the clinic.    Take Care,   Dr. Andy Gauss

## 2017-09-16 NOTE — Assessment & Plan Note (Signed)
Likely secondary to elevated INR ( >8). Last colonoscopy 2009. Will refer to GI for colonoscopy. Suspect bleeding will improve as INR corrects to normal range.  --Ambulatory referral to GI

## 2017-09-16 NOTE — Assessment & Plan Note (Signed)
Last hemoglobin 7.8 on 5/1 down from 9.2 on 4/23. On discharge hemoglobin 7.6. Patient's INR was >8 recently with multiple days of epitaxis. Patient also reporting dark tarry stools likely GI bleed in the setting of elevated INR. Anemia likely secondary to recent epistaxis and possible GI bleed. Patient is also ESRD on dialysis and has a component of chronic anemia. Patient is receiving aranesp at dialysis. POCT INR today is 3.8, will continue to titrate. --CBC with Diff --Will follow up on results

## 2017-09-17 DIAGNOSIS — J9601 Acute respiratory failure with hypoxia: Secondary | ICD-10-CM | POA: Diagnosis not present

## 2017-09-17 DIAGNOSIS — A4101 Sepsis due to Methicillin susceptible Staphylococcus aureus: Secondary | ICD-10-CM | POA: Diagnosis not present

## 2017-09-17 DIAGNOSIS — N186 End stage renal disease: Secondary | ICD-10-CM | POA: Diagnosis not present

## 2017-09-17 DIAGNOSIS — T827XXD Infection and inflammatory reaction due to other cardiac and vascular devices, implants and grafts, subsequent encounter: Secondary | ICD-10-CM | POA: Diagnosis not present

## 2017-09-17 DIAGNOSIS — N2581 Secondary hyperparathyroidism of renal origin: Secondary | ICD-10-CM | POA: Diagnosis not present

## 2017-09-17 DIAGNOSIS — R197 Diarrhea, unspecified: Secondary | ICD-10-CM | POA: Diagnosis not present

## 2017-09-17 DIAGNOSIS — D631 Anemia in chronic kidney disease: Secondary | ICD-10-CM | POA: Diagnosis not present

## 2017-09-17 DIAGNOSIS — Z48812 Encounter for surgical aftercare following surgery on the circulatory system: Secondary | ICD-10-CM | POA: Diagnosis not present

## 2017-09-17 DIAGNOSIS — I509 Heart failure, unspecified: Secondary | ICD-10-CM | POA: Diagnosis not present

## 2017-09-17 DIAGNOSIS — I428 Other cardiomyopathies: Secondary | ICD-10-CM | POA: Diagnosis not present

## 2017-09-17 DIAGNOSIS — I132 Hypertensive heart and chronic kidney disease with heart failure and with stage 5 chronic kidney disease, or end stage renal disease: Secondary | ICD-10-CM | POA: Diagnosis not present

## 2017-09-17 LAB — CBC
HEMATOCRIT: 25.9 % — AB (ref 37.5–51.0)
Hemoglobin: 8 g/dL — ABNORMAL LOW (ref 13.0–17.7)
MCH: 27 pg (ref 26.6–33.0)
MCHC: 30.9 g/dL — ABNORMAL LOW (ref 31.5–35.7)
MCV: 88 fL (ref 79–97)
PLATELETS: 128 10*3/uL — AB (ref 150–379)
RBC: 2.96 x10E6/uL — AB (ref 4.14–5.80)
RDW: 17.5 % — ABNORMAL HIGH (ref 12.3–15.4)
WBC: 2.1 10*3/uL — CL (ref 3.4–10.8)

## 2017-09-18 ENCOUNTER — Ambulatory Visit (INDEPENDENT_AMBULATORY_CARE_PROVIDER_SITE_OTHER): Payer: Medicare HMO | Admitting: Pharmacist

## 2017-09-18 DIAGNOSIS — Z7901 Long term (current) use of anticoagulants: Secondary | ICD-10-CM

## 2017-09-18 DIAGNOSIS — I480 Paroxysmal atrial fibrillation: Secondary | ICD-10-CM | POA: Diagnosis not present

## 2017-09-18 LAB — POCT INR: INR: 5.2

## 2017-09-19 DIAGNOSIS — Z48812 Encounter for surgical aftercare following surgery on the circulatory system: Secondary | ICD-10-CM | POA: Diagnosis not present

## 2017-09-19 DIAGNOSIS — N2581 Secondary hyperparathyroidism of renal origin: Secondary | ICD-10-CM | POA: Diagnosis not present

## 2017-09-19 DIAGNOSIS — N186 End stage renal disease: Secondary | ICD-10-CM | POA: Diagnosis not present

## 2017-09-19 DIAGNOSIS — D631 Anemia in chronic kidney disease: Secondary | ICD-10-CM | POA: Diagnosis not present

## 2017-09-19 DIAGNOSIS — I428 Other cardiomyopathies: Secondary | ICD-10-CM | POA: Diagnosis not present

## 2017-09-19 DIAGNOSIS — I132 Hypertensive heart and chronic kidney disease with heart failure and with stage 5 chronic kidney disease, or end stage renal disease: Secondary | ICD-10-CM | POA: Diagnosis not present

## 2017-09-19 DIAGNOSIS — A4101 Sepsis due to Methicillin susceptible Staphylococcus aureus: Secondary | ICD-10-CM | POA: Diagnosis not present

## 2017-09-19 DIAGNOSIS — I509 Heart failure, unspecified: Secondary | ICD-10-CM | POA: Diagnosis not present

## 2017-09-19 DIAGNOSIS — T827XXD Infection and inflammatory reaction due to other cardiac and vascular devices, implants and grafts, subsequent encounter: Secondary | ICD-10-CM | POA: Diagnosis not present

## 2017-09-19 DIAGNOSIS — J9601 Acute respiratory failure with hypoxia: Secondary | ICD-10-CM | POA: Diagnosis not present

## 2017-09-21 ENCOUNTER — Encounter: Payer: Self-pay | Admitting: Pulmonary Disease

## 2017-09-21 ENCOUNTER — Encounter: Payer: Self-pay | Admitting: Internal Medicine

## 2017-09-21 ENCOUNTER — Ambulatory Visit (INDEPENDENT_AMBULATORY_CARE_PROVIDER_SITE_OTHER): Payer: Medicare HMO | Admitting: Pharmacist

## 2017-09-21 ENCOUNTER — Ambulatory Visit: Payer: Medicare HMO | Admitting: Cardiovascular Disease

## 2017-09-21 ENCOUNTER — Encounter: Payer: Self-pay | Admitting: Cardiovascular Disease

## 2017-09-21 ENCOUNTER — Encounter: Payer: Self-pay | Admitting: Cardiology

## 2017-09-21 VITALS — BP 126/84 | HR 79 | Ht 73.0 in | Wt 279.0 lb

## 2017-09-21 DIAGNOSIS — I4891 Unspecified atrial fibrillation: Secondary | ICD-10-CM | POA: Diagnosis not present

## 2017-09-21 DIAGNOSIS — Z5181 Encounter for therapeutic drug level monitoring: Secondary | ICD-10-CM

## 2017-09-21 DIAGNOSIS — Z79899 Other long term (current) drug therapy: Secondary | ICD-10-CM | POA: Diagnosis not present

## 2017-09-21 DIAGNOSIS — Z7901 Long term (current) use of anticoagulants: Secondary | ICD-10-CM

## 2017-09-21 DIAGNOSIS — I2721 Secondary pulmonary arterial hypertension: Secondary | ICD-10-CM

## 2017-09-21 DIAGNOSIS — N186 End stage renal disease: Secondary | ICD-10-CM | POA: Diagnosis not present

## 2017-09-21 DIAGNOSIS — T827XXD Infection and inflammatory reaction due to other cardiac and vascular devices, implants and grafts, subsequent encounter: Secondary | ICD-10-CM | POA: Diagnosis not present

## 2017-09-21 DIAGNOSIS — I480 Paroxysmal atrial fibrillation: Secondary | ICD-10-CM

## 2017-09-21 LAB — POCT INR: INR: 3.7

## 2017-09-21 NOTE — Patient Instructions (Signed)
Dr Sallyanne Kuster recommends that you continue on your current medications as directed. Please refer to the Current Medication list given to you today.  You have been referred to Dr Lovena Le, first available, to discuss the need to reimplant a new defibrillator.  Dr Sallyanne Kuster recommends that you follow-up with as needed.

## 2017-09-21 NOTE — Progress Notes (Signed)
Cardiology Office Note    Date:  09/22/2017   ID:  Bennye Alm., DOB 03/21/1961, MRN [id number]  PCP:  [name], MD  Cardiologist:  Lenna Sciara. Percival Spanish, M.D.; Quay Burow, M.D. (PAD); Sanda Klein, MD   Chief Complaint  Patient presents with  . Follow-up    ICD explantation    History of Present Illness:  Adam [name]. is a 57 y.o. male with advanced nonischemic cardiomyopathy, pulmonary arterial hypertension, peripheral arterial disease, renal failure on hemodialysis, here for follow-up after explantation of his defibrillator for infection with staph bacteremia.    His last day of antibiotics is May 27.  He is wearing a LifeVest.  His AV fistula was also infected and extracted on April 11, same day as his defibrillator.  His transthoracic echo on April 9 did not show clear evidence of infection, he underwent a TEE on April 11 but I cannot locate a report.  It may have been just anesthesiology monitoring during his lead extraction procedure.  He is not getting dialysis through a tunneled left subclavian catheter.  The right subclavian defibrillator site is healing pretty well.  The echo showed LVEF 60-65% with mild LVH, moderate aortic stenosis (mean gradient 25 mmHg, area 1.15 cm), moderate TR with an estimated systolic PA pressure of 59 mmHg and severe right atrial enlargement.  No vegetations were seen.  Note that his defibrillator was implanted in 2009 for unclear indication.  Echo is from [place] from 2012, 2014 and 2015 all showing normal left ventricular systolic function.  The patient specifically denies any chest pain at rest exertion, dyspnea at rest or with exertion, orthopnea, paroxysmal nocturnal dyspnea, syncope, palpitations, focal neurological deficits, intermittent claudication, lower extremity edema, unexplained weight gain, cough, hemoptysis or wheezing.  He is anuric ever since the failure of his kidney transplant.  Dry weight is estimated to  be 125 kg.  He generally only needs removal of roughly 2 L at each dialysis session.  He reported 2 episodes of defibrillator discharge when he was living in [place].  Since he has moved down to [place] we have never seen any serious arrhythmia, only very rare and very brief episodes of nonsustained VT.  He has normal left ventricular systolic function, although he does have secondary right ventricular dysfunction due to significant pulmonary artery hypertension.  He is still taking amiodarone.  Is unclear whether this medication was prescribed for atrial fibrillation or ventricular tachycardia.  His defibrillator is a Medtronic Evera XT dual-chamber device (generator change February 2016, initial device and leads implanted in 2009: atrial lead [employer] [id number] serial number [id number], defibrillator lead Medtronic [phone] Sprint Quattro secure serial number [id number]). He reports 2 previous defibrillator shocks that each occurred roughly 4 years ago on separate occasions. He did not have syncope with either event. He believes that the defibrillator discharges were "appropriate". One happened while he was riding in an elevator, the other happened while he was walking at a slow pace. He also has a history of paroxysmal atrial fibrillation for which he takes amiodarone and warfarin. He is on combination vasodilator therapy for pulmonary artery hypertension, etiology uncertain. He is now seeing Dr. Spero Curb for this . He has a history of thyroidectomy following thyroid cancer and is on chronic thyroid hormone replacement. 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 He is currently receiving hemodialysis via a left  antecubital AV fistula.  Past Medical History:  Diagnosis Date  . AICD (automatic cardioverter/defibrillator) present    Medtronic   . Aortic stenosis    moderate AS by  08/2016 echo  . Arthritis    "knees" (08/05/2016)  . Atrial fibrillation ([place])   . ESRD (end stage renal disease) on dialysis ([place])    "M, T, W, T, F; NX stage hemodialysis; I do it at home" (08/05/2016)  . Great toe amputation status ([place])    status post left hallux amputation 08/01/14  . History of blood transfusion 2009   "S/P biopsy for prostate cancer check"  . Hypertension   . Hypothyroidism (acquired)   . Mitral stenosis    moderate mitral stenosis  . Nonischemic cardiomyopathy ([place])   . Pneumonia 2014; 08/05/2016  . PONV (postoperative nausea and vomiting)   . Pulmonary HTN ([place])   . Renal insufficiency   . Thyroid cancer ([place]) 2011    Past Surgical History:  Procedure Laterality Date  . ARTERIAL LINE INSERTION Right 08/24/2017   Procedure: ARTERIAL LINE INSERTION INTO RIGHT FEMORAL ARTERY;  Surgeon: Conrad Stonybrook, MD;  Location: [facility];  Service: Vascular;  Laterality: Right;  . CARDIAC CATHETERIZATION Right 2017  . CARDIAC DEFIBRILLATOR PLACEMENT  2009  . DIALYSIS FISTULA CREATION Left 2003   "upper arm"  . DIALYSIS/PERMA CATHETER REMOVAL Left 09/01/2017   Procedure: DIALYSIS/PERMA CATHETER REMOVAL;  Surgeon: Conrad [place], MD;  Location: [place];  Service: Vascular;  Laterality: Left;  . HERNIA REPAIR    . ICD LEAD REMOVAL Right 08/20/2017   Procedure: ICD EXTRACTION WITH C-ARM;  Surgeon: Evans Lance, MD;  Location: [facility] OR;  Service: Cardiovascular;  Laterality: Right;  DR VAN TRIGT TO BACK-UP  . INSERTION OF DIALYSIS CATHETER N/A 11/11/2016   Procedure: INSERTION OF TUNNELED DIALYSIS CATHETER;  Surgeon: Angelia Mould, MD;  Location: [facility];  Service: Vascular;  Laterality: N/A;  . INSERTION OF DIALYSIS CATHETER Left 08/24/2017   Procedure: INSERTION OF DIALYSIS CATHETER;  Surgeon: Conrad [place], MD;  Location: [place];  Service: Vascular;  Laterality: Left;  . INSERTION OF DIALYSIS CATHETER Left 09/01/2017   Procedure: INSERTION OF DIALYSIS CATHETER;  Surgeon: Conrad [place], MD;  Location: [facility];  Service: Vascular;  Laterality: Left;  . KIDNEY TRANSPLANT  2009  . KNEE SURGERY Bilateral 2000s   "drained fluid"  . LAPAROSCOPIC GASTRIC BANDING  2006  . PROSTATE BIOPSY  2009  . REVISON OF ARTERIOVENOUS FISTULA Left 7/35/3299   Procedure: PLICATION OF ARTERIOVENOUS FISTULA ANEURYSM;  Surgeon: Elam Dutch, MD;  Location: [facility];  Service: Vascular;  Laterality: Left;  . REVISON OF ARTERIOVENOUS FISTULA Left 11/10/2016   Procedure: REVISON OF ARTERIOVENOUS FISTULA - LEFT UPPER ARM;  Surgeon: Elam Dutch, MD;  Location: [facility];  Service: Vascular;  Laterality: Left;  . REVISON OF ARTERIOVENOUS FISTULA Left 08/24/2017   Procedure: REVISON OF ARTERIOVENOUS FISTULA LEFT;  Surgeon: Conrad [place], MD;  Location: [facility];  Service: Vascular;  Laterality: Left;  . TEE WITHOUT CARDIOVERSION N/A 08/20/2017   Procedure: TRANSESOPHAGEAL ECHOCARDIOGRAM (TEE);  Surgeon: Evans Lance, MD;  Location: [facility];  Service: Cardiovascular;  Laterality: N/A;  . THROMBECTOMY W/ EMBOLECTOMY Left 12/25/2014   Procedure: THROMBECTOMY ARTERIOVENOUS FISTULA;  Surgeon: Elam Dutch, MD;  Location: [facility];  Service: Vascular;  Laterality: Left;  . THYROIDECTOMY  2011  . TOE AMPUTATION Bilateral     right 1st and  2nd digits;  left 1st and 3rd digits"  . UMBILICAL HERNIA REPAIR  X 2  . VEIN HARVEST Left 08/24/2017   Procedure: VEIN HARVEST;  Surgeon: Conrad [place], MD;  Location: [facility] OR;  Service: Vascular;  Laterality: Left;    Current Medications: Outpatient Medications Prior to Visit  Medication Sig Dispense Refill  . ambrisentan (LETAIRIS) 10 MG tablet Take 1 tablet (10 mg total) by mouth daily. 30 tablet 11  . amiodarone (PACERONE) 200 MG tablet Take 200 mg by mouth every evening.     Marland Kitchen atorvastatin (LIPITOR) 40 MG tablet Take 1 tablet (40 mg total) by mouth every evening. 30 tablet 2  . ceFAZolin (ANCEF) IVPB Inject 2 g into the vein every dialysis (GIVE AT END OF EACH HD  SESSION (MWF schedule)). Indication: MSSA Bacteremia Last Day of Therapy: 10/05/17 Labs - Once weekly:  CBC/D and BMP, Labs - Every other week:  ESR and CRP 30 Units 0  . cinacalcet (SENSIPAR) 90 MG tablet Take 90 mg by mouth every evening.     Marland Kitchen Dextromethorphan HBr (VICKS DAYQUIL COUGH PO) Take 1 tablet by mouth daily.    Marland Kitchen levothyroxine (SYNTHROID, LEVOTHROID) 300 MCG tablet Take 1 tablet (300 mcg total) by mouth daily. 90 tablet 3  . midodrine (PROAMATINE) 10 MG tablet Take 1 tablet (10 mg total) by mouth 3 (three) times daily. 90 tablet 6  . tadalafil, PAH, (ADCIRCA) 20 MG tablet 1 tab daily (Patient taking differently: Take 20 mg by mouth daily. ) 30 tablet 11  . warfarin (COUMADIN) 6 MG tablet Take 1 tablet (6 mg total) by mouth one time only at 6 PM. (Patient taking differently: Take 6-9 mg by mouth See admin instructions. Take 29m on Sundays and 956mMonday through Saturday.) 30 tablet 0   No facility-administered medications prior to visit.      Allergies:   Enalaprilat; Vasotec [enalapril]; and Iodinated diagnostic agents   Social History   Socioeconomic History  . Marital status: Married    Spouse name: Not on file  . Number of children: 1  . Years of education: Not on file  . Highest education level: Not on file  Occupational History  . Not on file  Social Needs  . Financial resource strain: Not on file  . Food insecurity:    Worry: Not on file    Inability: Not on file  . Transportation needs:    Medical: Not on file    Non-medical: Not on file  Tobacco Use  . Smoking status: Never Smoker  . Smokeless tobacco: Never Used  Substance and Sexual Activity  . Alcohol use: Yes    Alcohol/week: 0.0 oz    Comment: 08/05/2016 "might have 1 shot/month of liquor"  . Drug use: No  . Sexual activity: Yes  Lifestyle  . Physical activity:    Days per week: Not on file    Minutes per session: Not on file  . Stress: Not on file  Relationships  . Social connections:     Talks on phone: Not on file    Gets together: Not on file    Attends religious service: Not on file    Active member of club or organization: Not on file    Attends meetings of clubs or organizations: Not on file    Relationship status: Not on file  Other Topics Concern  . Not on file  Social History Narrative   Retired deArchivist      Family History:  The patient's family history includes CAD (age of onset: 54) in his mother; Emphysema in his mother; Heart failure in his mother; Hypertension in his father and mother; Kidney failure in his father.   ROS:   Please see the history of present illness.    ROS All other systems reviewed and are negative.   PHYSICAL EXAM:   VS:  BP 126/84   Pulse 79   Ht _0  (1.854 m)   Wt 279 lb (126.6 kg)   BMI 36.81 kg/m     GEN: Severely obese, well developed, in no acute distress  HEENT: normal  Neck: no JVD, carotid bruits, or masses Cardiac: RRR; 2/6 holosystolic left lower sternal border murmur; no  diastolic murmurs, rubs, or gallops,no edema; healthy right subclavian ICD site; left antecubital AV fistula site with woundvac Respiratory:  clear to auscultation bilaterally, normal work of breathing GI: soft, nontender, nondistended, + BS MS: no deformity or atrophy  Skin: warm and dry, no rash Neuro:  Alert and Oriented x 3, Strength and sensation are intact Psych: euthymic mood, full affect  Wt Readings from Last 3 Encounters:  09/21/17 279 lb (126.6 kg)  09/16/17 278 lb (126.1 kg)  09/09/17 272 lb (123.4 kg)      Studies/Labs Reviewed:   EKG:  EKG is ot ordered today.  Sinus rhythm with first-degree AV block and a single PAC, extremely low voltage in all leads, QS pattern in lead V1-V4.  Not much change from previous tracing.  Recent Labs: 01/26/2017: TSH 3.730 08/26/2017: Magnesium 1.8 09/09/2017: ALT <5; BUN 16; Creatinine, Ser 7.11; Potassium 3.5; Sodium 131 09/16/2017: Hemoglobin 8.0; Platelets 128   Lipid  Panel    Component Value Date/Time   CHOL 149 03/24/2016 1616   TRIG 142 03/24/2016 1616   HDL 40 (L) 03/24/2016 1616   CHOLHDL 3.7 03/24/2016 1616   VLDL 28 03/24/2016 1616   LDLCALC 81 03/24/2016 1616      ASSESSMENT:    1. Infection involving implantable cardioverter-defibrillator (ICD), subsequent encounter   2. Atrial fibrillation, unspecified type ([place])   3. Encounter for monitoring amiodarone therapy   4. Long term (current) use of anticoagulants   5. PAH (pulmonary artery hypertension) ([place])   6. ESRD (end stage renal disease) ([place])      PLAN:  In order of problems listed above:  1. ICD infection: unfortunately, I never really understood why he received a defibrillator.  Per the patient's own report, it was a big surprise when he was recommended a defibrillator.  He had a complete cardiac work-up before his kidney transplant and was told that everything was okay, then 6 months after transplantation was told he need a defibrillator.  I wonder if he had transient myocardial dysfunction.  He has had normal left ventricular systolic function on echocardiograms performed every other year since 2012 until his last admission.  We have not seen any meaningful episodes of sustained ventricular tachycardia or device discharges since we have been following his device in 2016.  As far as I can tell, I do not think he should not have device reimplantation, but before making such an important decision would like to get Dr. Tanna Furry opinion as well.  If the decision is made that he does not require reimplantation of his defibrillator, he no longer will need to wear LifeVest either.  If a defibrillator reimplantation is deemed to be necessary, he will be better served by a subcutaneous device due to the extremely high risk of  receiving a new transvenous device as well as the lack of subclavian access in a patient with dialysis needs (infection in right subclavian area, dialysis catheter in left  subclavian area). 2. AFib: None has been recorded in a long time.  In fact, I do not think we have ever seen atrial fibrillation since he moved to [place] in 2016.  Unfortunately we do not know the prevalence of his arrhythmia before that, since he had a defibrillator generator change out just before he moved down here.  His atrial fibrillation is most likely related to chronic right heart disease due to pulmonary hypertension. 3. Amiodarone: Consider reducing the dose of amiodarone since we have not seen atrial fibrillation in years, even though he has had a couple of acute illnesses.  On 4. Warfarin: No serious bleeding problems. 5. PAH: PDE 5 inhibitors and endothelin antagonist, monitored by Dr. Lake Bells.  Requires Midrin to maintain blood pressure 6. ESRD: To allow hemodialysis,   He needs to take Midodrine 10 mg.  AV fistula removed, currently has tunneled left subclavian catheter.  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    Medication Adjustments/Labs and Tests Ordered: Current medicines are reviewed at length with the patient today.  Concerns regarding medicines are outlined above.  Medication changes, Labs and Tests ordered today are listed in the Patient Instructions below. Patient Instructions  Dr Sallyanne Kuster recommends that you continue on your current medications as directed. Please refer to the Current Medication list given to you today.  You have been referred to Dr Lovena Le, first available, to discuss the need to reimplant a new defibrillator.  Dr Sallyanne Kuster recommends that you follow-up with as needed.    Signed, Sanda Klein, MD  09/22/2017 4:48 PM    [address], [place], [place]  [address] Phone: [phone]; Fax: [phone]

## 2017-09-22 ENCOUNTER — Encounter: Payer: Self-pay | Admitting: Cardiovascular Disease

## 2017-09-22 DIAGNOSIS — I428 Other cardiomyopathies: Secondary | ICD-10-CM | POA: Diagnosis not present

## 2017-09-22 DIAGNOSIS — N2581 Secondary hyperparathyroidism of renal origin: Secondary | ICD-10-CM | POA: Diagnosis not present

## 2017-09-22 DIAGNOSIS — M1711 Unilateral primary osteoarthritis, right knee: Secondary | ICD-10-CM | POA: Diagnosis not present

## 2017-09-22 DIAGNOSIS — T827XXD Infection and inflammatory reaction due to other cardiac and vascular devices, implants and grafts, subsequent encounter: Secondary | ICD-10-CM | POA: Diagnosis not present

## 2017-09-22 DIAGNOSIS — I509 Heart failure, unspecified: Secondary | ICD-10-CM | POA: Diagnosis not present

## 2017-09-22 DIAGNOSIS — M1712 Unilateral primary osteoarthritis, left knee: Secondary | ICD-10-CM | POA: Diagnosis not present

## 2017-09-22 DIAGNOSIS — I132 Hypertensive heart and chronic kidney disease with heart failure and with stage 5 chronic kidney disease, or end stage renal disease: Secondary | ICD-10-CM | POA: Diagnosis not present

## 2017-09-22 DIAGNOSIS — A4101 Sepsis due to Methicillin susceptible Staphylococcus aureus: Secondary | ICD-10-CM | POA: Diagnosis not present

## 2017-09-22 DIAGNOSIS — Z48812 Encounter for surgical aftercare following surgery on the circulatory system: Secondary | ICD-10-CM | POA: Diagnosis not present

## 2017-09-22 DIAGNOSIS — D631 Anemia in chronic kidney disease: Secondary | ICD-10-CM | POA: Diagnosis not present

## 2017-09-22 DIAGNOSIS — N186 End stage renal disease: Secondary | ICD-10-CM | POA: Diagnosis not present

## 2017-09-22 DIAGNOSIS — J9601 Acute respiratory failure with hypoxia: Secondary | ICD-10-CM | POA: Diagnosis not present

## 2017-09-22 LAB — C DIFFICILE, CYTOTOXIN B

## 2017-09-22 LAB — C DIFFICILE TOXINS A+B W/RFLX: C DIFFICILE TOXINS A+B, EIA: NEGATIVE

## 2017-09-22 NOTE — Telephone Encounter (Signed)
appt schedule for pt on 05/17 @ 8:00 am

## 2017-09-23 ENCOUNTER — Encounter: Payer: Self-pay | Admitting: Cardiology

## 2017-09-23 ENCOUNTER — Observation Stay (HOSPITAL_COMMUNITY)
Admission: EM | Admit: 2017-09-23 | Discharge: 2017-09-26 | Disposition: A | Discharge status: Home / Self Care | Payer: Medicare HMO | Attending: Family Medicine | Admitting: Family Medicine

## 2017-09-23 DIAGNOSIS — S301XXA Contusion of abdominal wall, initial encounter: Secondary | ICD-10-CM | POA: Diagnosis not present

## 2017-09-23 DIAGNOSIS — Z85850 Personal history of malignant neoplasm of thyroid: Secondary | ICD-10-CM | POA: Diagnosis not present

## 2017-09-23 DIAGNOSIS — Z992 Dependence on renal dialysis: Secondary | ICD-10-CM | POA: Diagnosis not present

## 2017-09-23 DIAGNOSIS — K2210 Ulcer of esophagus without bleeding: Secondary | ICD-10-CM | POA: Insufficient documentation

## 2017-09-23 DIAGNOSIS — Z940 Kidney transplant status: Secondary | ICD-10-CM | POA: Insufficient documentation

## 2017-09-23 DIAGNOSIS — Z79890 Hormone replacement therapy (postmenopausal): Secondary | ICD-10-CM | POA: Diagnosis not present

## 2017-09-23 DIAGNOSIS — K2950 Unspecified chronic gastritis without bleeding: Secondary | ICD-10-CM | POA: Diagnosis not present

## 2017-09-23 DIAGNOSIS — J810 Acute pulmonary edema: Secondary | ICD-10-CM | POA: Diagnosis not present

## 2017-09-23 DIAGNOSIS — X58XXXA Exposure to other specified factors, initial encounter: Secondary | ICD-10-CM | POA: Diagnosis not present

## 2017-09-23 DIAGNOSIS — I272 Pulmonary hypertension, unspecified: Secondary | ICD-10-CM | POA: Insufficient documentation

## 2017-09-23 DIAGNOSIS — I429 Cardiomyopathy, unspecified: Secondary | ICD-10-CM | POA: Insufficient documentation

## 2017-09-23 DIAGNOSIS — I724 Aneurysm of artery of lower extremity: Secondary | ICD-10-CM | POA: Insufficient documentation

## 2017-09-23 DIAGNOSIS — Z79899 Other long term (current) drug therapy: Secondary | ICD-10-CM | POA: Insufficient documentation

## 2017-09-23 DIAGNOSIS — Z89411 Acquired absence of right great toe: Secondary | ICD-10-CM | POA: Insufficient documentation

## 2017-09-23 DIAGNOSIS — I480 Paroxysmal atrial fibrillation: Secondary | ICD-10-CM | POA: Diagnosis not present

## 2017-09-23 DIAGNOSIS — I959 Hypotension, unspecified: Secondary | ICD-10-CM | POA: Diagnosis not present

## 2017-09-23 DIAGNOSIS — Z89412 Acquired absence of left great toe: Secondary | ICD-10-CM | POA: Insufficient documentation

## 2017-09-23 DIAGNOSIS — Z89421 Acquired absence of other right toe(s): Secondary | ICD-10-CM | POA: Insufficient documentation

## 2017-09-23 DIAGNOSIS — K921 Melena: Secondary | ICD-10-CM | POA: Insufficient documentation

## 2017-09-23 DIAGNOSIS — D696 Thrombocytopenia, unspecified: Secondary | ICD-10-CM | POA: Diagnosis not present

## 2017-09-23 DIAGNOSIS — N186 End stage renal disease: Secondary | ICD-10-CM | POA: Insufficient documentation

## 2017-09-23 DIAGNOSIS — I482 Chronic atrial fibrillation: Secondary | ICD-10-CM | POA: Diagnosis not present

## 2017-09-23 DIAGNOSIS — K3189 Other diseases of stomach and duodenum: Secondary | ICD-10-CM | POA: Insufficient documentation

## 2017-09-23 DIAGNOSIS — E890 Postprocedural hypothyroidism: Secondary | ICD-10-CM | POA: Diagnosis not present

## 2017-09-23 DIAGNOSIS — E8889 Other specified metabolic disorders: Secondary | ICD-10-CM | POA: Insufficient documentation

## 2017-09-23 DIAGNOSIS — I129 Hypertensive chronic kidney disease with stage 1 through stage 4 chronic kidney disease, or unspecified chronic kidney disease: Secondary | ICD-10-CM | POA: Diagnosis not present

## 2017-09-23 DIAGNOSIS — R55 Syncope and collapse: Secondary | ICD-10-CM | POA: Diagnosis not present

## 2017-09-23 DIAGNOSIS — D631 Anemia in chronic kidney disease: Secondary | ICD-10-CM

## 2017-09-23 DIAGNOSIS — Z89422 Acquired absence of other left toe(s): Secondary | ICD-10-CM | POA: Insufficient documentation

## 2017-09-23 DIAGNOSIS — E785 Hyperlipidemia, unspecified: Secondary | ICD-10-CM | POA: Diagnosis not present

## 2017-09-23 DIAGNOSIS — D649 Anemia, unspecified: Secondary | ICD-10-CM | POA: Diagnosis not present

## 2017-09-23 DIAGNOSIS — B9561 Methicillin susceptible Staphylococcus aureus infection as the cause of diseases classified elsewhere: Secondary | ICD-10-CM | POA: Diagnosis not present

## 2017-09-23 DIAGNOSIS — N189 Chronic kidney disease, unspecified: Secondary | ICD-10-CM | POA: Diagnosis not present

## 2017-09-23 DIAGNOSIS — I120 Hypertensive chronic kidney disease with stage 5 chronic kidney disease or end stage renal disease: Secondary | ICD-10-CM | POA: Insufficient documentation

## 2017-09-23 DIAGNOSIS — I739 Peripheral vascular disease, unspecified: Secondary | ICD-10-CM | POA: Insufficient documentation

## 2017-09-23 DIAGNOSIS — D62 Acute posthemorrhagic anemia: Secondary | ICD-10-CM | POA: Diagnosis not present

## 2017-09-23 DIAGNOSIS — R569 Unspecified convulsions: Secondary | ICD-10-CM | POA: Diagnosis not present

## 2017-09-23 LAB — CBG MONITORING, ED: GLUCOSE-CAPILLARY: 90 mg/dL (ref 65–99)

## 2017-09-23 NOTE — ED Provider Notes (Signed)
[facility] EMERGENCY DEPARTMENT Provider Note   CSN: [id number] Arrival date & time: 09/23/17  2148     History   Chief Complaint No chief complaint on file.   HPI Adam [name]. is a 57 y.o. male.  Adam [name]. is a 57 y/o with ESRD, on home hemodialysis Monday through Friday for the past year, atrial fibrillation on Coumadin, chronic hypertension, nonischemic cardiomyopathy with AICD, pulmonary hypertension, moderate aortic stenosis. Recent admission in April for MSSA bacteremia and underwent defibrillator and AV fistula removal. He is currently on Ancef with hemodialysis, now being performed in the dialysis center until completion of antibiotics planned for 09/28/17. He was also taken off coumadin temporarily. No plan to reimplant his pacer/defibrillator. Tonight while lying in bed watching TV with his wife, he sat up on the side of the bed at which time, per wife, he became rigid and shaking in generalized distribution. He stopped responding to her calling his name. The episode lasted about one minute before he started responding to her. He was immediately back to his baseline without any confusion, vomiting, incontinence. He denies headache, CP, SOB, nausea, vomiting, recent illness, weakness or numbness. His post-hospital course since last admission has been uncomplicated. No history of seizures.   The history is provided by the patient and the spouse. No language interpreter was used.    Past Medical History:  Diagnosis Date  . AICD (automatic cardioverter/defibrillator) present    [employer]   . Aortic stenosis    moderate AS by 08/2016 echo  . Arthritis    "knees" (08/05/2016)  . Atrial fibrillation ([place])   . ESRD (end stage renal disease) on dialysis ([place])    "M, T, W, T, F; NX stage hemodialysis; I do it at home" (08/05/2016)  . Great toe amputation status ([place])    status post left hallux amputation 08/01/14  . History of blood transfusion  2009   "S/P biopsy for prostate cancer check"  . Hypertension   . Hypothyroidism (acquired)   . Mitral stenosis    moderate mitral stenosis  . Nonischemic cardiomyopathy ([place])   . Pneumonia 2014; 08/05/2016  . PONV (postoperative nausea and vomiting)   . Pulmonary HTN ([place])   . Renal insufficiency   . Thyroid cancer [facility]) 2011    Patient Active Problem List   Diagnosis Date Noted  . Anemia 09/16/2017  . Diarrhea 09/16/2017  . Tarry stools 09/16/2017  . Acute encephalopathy   . Hypotension   . Complication of arteriovenous dialysis fistula   . Stage 5 chronic kidney disease on chronic dialysis ([place])   . Infected defibrillator ([place])   . MSSA bacteremia 08/18/2017  . Sepsis ([place]) 08/17/2017  . Lymphadenopathy, mediastinal 04/10/2017  . Hypothyroidism 10/09/2016  . Acute respiratory failure with hypoxia ([place]) 08/06/2016  . HCAP (healthcare-associated pneumonia) 08/05/2016  . Thyroid cancer ([place]) 06/12/2016  . Epistaxis 03/04/2016  . Long term current use of amiodarone 01/10/2016  . Critical lower limb ischemia 12/04/2015  . Carotid artery stenosis 05/24/2015  . Excess skin 05/24/2015  . Osteomyelitis of left foot ([place]) 04/30/2015  . Status post amputation 04/19/2015  . Wound dehiscence 04/19/2015  . Sinus congestion 03/08/2015  . Bleeding pseudoaneurysm of left brachiocephalic arteriovenous fistula ([place]) 12/24/2014  . Pure hypercholesterolemia 11/09/2014  . History of thyroid cancer 11/07/2014  . Obesity 11/07/2014  . Healthcare maintenance 11/07/2014  . H/O malignant neoplasm of thyroid 11/07/2014  . Toe amputation status ([name]) 10/13/2014  . ICD (  implantable cardioverter-defibrillator) in place 07/25/2014  . ESRD (end stage renal disease) on hemodialysis 07/25/2014  . End-stage renal disease ([place]) 07/25/2014  . Automatic implantable cardioverter-defibrillator in situ 07/25/2014  . Pulmonary hypertension ([place]) 07-Jul-202016  . Secondary pulmonary hypertension 07-Jul-202016    . Atrial fibrillation [I48.91] 05/24/2014  . Long term current use of anticoagulant therapy 05/24/2014    Past Surgical History:  Procedure Laterality Date  . ARTERIAL LINE INSERTION Right 08/24/2017   Procedure: ARTERIAL LINE INSERTION INTO RIGHT FEMORAL ARTERY;  Surgeon: Conrad Craig, MD;  Location: [facility];  Service: Vascular;  Laterality: Right;  . CARDIAC CATHETERIZATION Right 2017  . CARDIAC DEFIBRILLATOR PLACEMENT  2009  . DIALYSIS FISTULA CREATION Left 2003   "upper arm"  . DIALYSIS/PERMA CATHETER REMOVAL Left 09/01/2017   Procedure: DIALYSIS/PERMA CATHETER REMOVAL;  Surgeon: Conrad [place], MD;  Location: [facility];  Service: Vascular;  Laterality: Left;  . HERNIA REPAIR    . ICD LEAD REMOVAL Right 08/20/2017   Procedure: ICD EXTRACTION WITH C-ARM;  Surgeon: Evans Lance, MD;  Location: [facility];  Service: Cardiovascular;  Laterality: Right;  DR VAN TRIGT TO BACK-UP  . INSERTION OF DIALYSIS CATHETER N/A 11/11/2016   Procedure: INSERTION OF TUNNELED DIALYSIS CATHETER;  Surgeon: Angelia Mould, MD;  Location: [facility];  Service: Vascular;  Laterality: N/A;  . INSERTION OF DIALYSIS CATHETER Left 08/24/2017   Procedure: INSERTION OF DIALYSIS CATHETER;  Surgeon: Conrad Southside, MD;  Location: [facility];  Service: Vascular;  Laterality: Left;  . INSERTION OF DIALYSIS CATHETER Left 09/01/2017   Procedure: INSERTION OF DIALYSIS CATHETER;  Surgeon: Conrad [place], MD;  Location: [facility];  Service: Vascular;  Laterality: Left;  . KIDNEY TRANSPLANT  2009  . KNEE SURGERY Bilateral 2000s   "drained fluid"  . LAPAROSCOPIC GASTRIC BANDING  2006  . PROSTATE BIOPSY  2009  . REVISON OF ARTERIOVENOUS FISTULA Left 1/61/0960   Procedure: PLICATION OF ARTERIOVENOUS FISTULA ANEURYSM;  Surgeon: Elam Dutch, MD;  Location: [facility];  Service: Vascular;  Laterality: Left;  . REVISON OF ARTERIOVENOUS FISTULA Left 11/10/2016   Procedure: REVISON OF ARTERIOVENOUS FISTULA - LEFT UPPER ARM;  Surgeon: Elam Dutch,  MD;  Location: [facility];  Service: Vascular;  Laterality: Left;  . REVISON OF ARTERIOVENOUS FISTULA Left 08/24/2017   Procedure: REVISON OF ARTERIOVENOUS FISTULA LEFT;  Surgeon: Conrad Lathrop, MD;  Location: [facility];  Service: Vascular;  Laterality: Left;  . TEE WITHOUT CARDIOVERSION N/A 08/20/2017   Procedure: TRANSESOPHAGEAL ECHOCARDIOGRAM (TEE);  Surgeon: Evans Lance, MD;  Location: [facility];  Service: Cardiovascular;  Laterality: N/A;  . THROMBECTOMY W/ EMBOLECTOMY Left 12/25/2014   Procedure: THROMBECTOMY ARTERIOVENOUS FISTULA;  Surgeon: Elam Dutch, MD;  Location: [facility];  Service: Vascular;  Laterality: Left;  . THYROIDECTOMY  2011  . TOE AMPUTATION Bilateral     right 1st and  2nd digits; left 1st and 3rd digits"  . UMBILICAL HERNIA REPAIR  X 2  . VEIN HARVEST Left 08/24/2017   Procedure: VEIN HARVEST;  Surgeon: Conrad [place], MD;  Location[facility] OR;  Service: Vascular;  Laterality: Left;        Home Medications    Prior to Admission medications   Medication Sig Start Date End Date Taking? Authorizing Provider  ambrisentan (LETAIRIS) 10 MG tablet Take 1 tablet (10 mg total) by mouth daily. 09/11/17   Juanito Doom, MD  amiodarone (PACERONE) 200 MG tablet Take 200 mg by mouth every  evening.     [provider]  atorvastatin (LIPITOR) 40 MG tablet Take 1 tablet (40 mg total) by mouth every evening. 06/15/17   Diallo, Earna Coder, MD  ceFAZolin (ANCEF) IVPB Inject 2 g into the vein every dialysis (GIVE AT END OF EACH HD SESSION (MWF schedule)). Indication: MSSA Bacteremia Last Day of Therapy: 10/05/17 Labs - Once weekly:  CBC/D and BMP, Labs - Every other week:  ESR and CRP 08/28/17 10/05/17  Manuella Ghazi, Pratik D, DO  cinacalcet (SENSIPAR) 90 MG tablet Take 90 mg by mouth every evening.     [provider]  Dextromethorphan HBr (VICKS DAYQUIL COUGH PO) Take 1 tablet by mouth daily.    [provider]  levothyroxine (SYNTHROID, LEVOTHROID) 300 MCG tablet Take 1 tablet  (300 mcg total) by mouth daily. 06/09/17   Renato Shin, MD  midodrine (PROAMATINE) 10 MG tablet Take 1 tablet (10 mg total) by mouth 3 (three) times daily. 09/02/16   Minus Breeding, MD  tadalafil, PAH, (ADCIRCA) 20 MG tablet 1 tab daily Patient taking differently: Take 20 mg by mouth daily.  12/05/16   Juanito Doom, MD  warfarin (COUMADIN) 6 MG tablet Take 1 tablet (6 mg total) by mouth one time only at 6 PM. Patient taking differently: Take 6-9 mg by mouth See admin instructions. Take '6mg'$  on Sundays and '9mg'$  Monday through Saturday. 08/28/17 09/27/17  Heath Lark D, DO    Family History Family History  Problem Relation Age of Onset  . Heart failure Mother   . Hypertension Mother   . CAD Mother 49  . Emphysema Mother   . Hypertension Father   . Kidney failure Father     Social History Social History   Tobacco Use  . Smoking status: Never Smoker  . Smokeless tobacco: Never Used  Substance Use Topics  . Alcohol use: Yes    Alcohol/week: 0.0 oz    Comment: 08/05/2016 "might have 1 shot/month of liquor"  . Drug use: No     Allergies   Enalaprilat; Vasotec [enalapril]; and Iodinated diagnostic agents   Review of Systems Review of Systems  Constitutional: Negative for chills and fever.  HENT: Negative.   Respiratory: Positive for cough (Chronic cough, unchanged tonight). Negative for shortness of breath.   Cardiovascular: Negative.  Negative for chest pain.  Gastrointestinal: Negative.  Negative for abdominal pain, nausea and vomiting.  Musculoskeletal: Negative.  Negative for myalgias.  Skin: Negative.  Negative for color change and wound.  Neurological:       See HPI.  Psychiatric/Behavioral: Negative for confusion.     Physical Exam Updated Vital Signs Ht '6\' 1"'$  (1.854 m)   Wt 122.5 kg (270 lb)   BMI 35.62 kg/m   Physical Exam  Constitutional: He is oriented to person, place, and time. He appears well-developed and well-nourished.  HENT:  Head:  Normocephalic.  Neck: Normal range of motion. Neck supple.  Cardiovascular: Normal rate and regular rhythm.  Murmur (Aortic systolic) heard. Pulmonary/Chest: Effort normal and breath sounds normal. He has no wheezes. He has no rales.  Abdominal: Soft. Bowel sounds are normal. There is no tenderness. There is no rebound and no guarding.  Musculoskeletal: Normal range of motion. He exhibits no edema.  Neurological: He is alert and oriented to person, place, and time. He has normal strength and normal reflexes. No sensory deficit. He exhibits normal muscle tone. Coordination normal. GCS eye subscore is 4. GCS verbal subscore is 5. GCS motor subscore is 6.  CN's 3-12 intact.  Skin: Skin is warm and dry. No rash noted.  Psychiatric: He has a normal mood and affect.     ED Treatments / Results  Labs (all labs ordered are listed, but only abnormal results are displayed) Labs Reviewed  CBG MONITORING, ED    EKG None  Radiology No results found.  Procedures Procedures (including critical care time)  Medications Ordered in ED Medications - No data to display   Initial Impression / Assessment and Plan / ED Course  I have reviewed the triage vital signs and the nursing notes.  Pertinent labs & imaging results that were available during my care of the patient were reviewed by me and considered in my medical decision making (see chart for details).     Patient with a complicated medical history presents with episode of syncope vs seizure, doubt seizure. No post-ictal period/symptoms, no history of neurologic events. Episode described not felt to be neurologic event. Favored to be cardiogenic given history.   No symptoms since arrival in the ED. His hemoglobin is found to be 6.6 where baseline appears to be 8.0. He is hemodynamically stable.   He will need to be admitted for observation and consultation with cardiology given history of a-fib, recent removal of defibrillator in setting  of potential cardiogenic syncope. Discussed with patient and spouse who agrees to stay.  Patient is [facility] patient. Discussed with resident who accepts him on to their service for admission.  Final Clinical Impressions(s) / ED Diagnoses   Final diagnoses:  None   1. Cardiogenic syncope 2. Anemia due to chronic kidney disease  ED Discharge Orders    None       Charlann Lange, Hershal Coria 09/25/17 0755    Quintella Reichert, MD 09/25/17 1524

## 2017-09-23 NOTE — ED Triage Notes (Signed)
Pt. Via EMS from home after experiencing seizure like activity per wife. Pt. Reports having a coughing episode and had an episode where he loss consciousness and had jerking activity. Pt. Does not have a hx of of seizures. Pt. Had 4 kilo of fluid taken off today. Pt. 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 Pt. On oral antibiotics. A/O X4

## 2017-09-24 ENCOUNTER — Encounter (HOSPITAL_COMMUNITY): Payer: Self-pay | Admitting: General Practice

## 2017-09-24 ENCOUNTER — Emergency Department (HOSPITAL_COMMUNITY): Payer: Medicare HMO

## 2017-09-24 ENCOUNTER — Observation Stay (HOSPITAL_BASED_OUTPATIENT_CLINIC_OR_DEPARTMENT_OTHER): Payer: Medicare HMO

## 2017-09-24 ENCOUNTER — Other Ambulatory Visit: Payer: Self-pay

## 2017-09-24 DIAGNOSIS — D631 Anemia in chronic kidney disease: Secondary | ICD-10-CM | POA: Diagnosis not present

## 2017-09-24 DIAGNOSIS — Z992 Dependence on renal dialysis: Secondary | ICD-10-CM

## 2017-09-24 DIAGNOSIS — I482 Chronic atrial fibrillation: Secondary | ICD-10-CM | POA: Diagnosis not present

## 2017-09-24 DIAGNOSIS — K2210 Ulcer of esophagus without bleeding: Secondary | ICD-10-CM | POA: Diagnosis not present

## 2017-09-24 DIAGNOSIS — R748 Abnormal levels of other serum enzymes: Secondary | ICD-10-CM | POA: Diagnosis not present

## 2017-09-24 DIAGNOSIS — D62 Acute posthemorrhagic anemia: Secondary | ICD-10-CM | POA: Diagnosis not present

## 2017-09-24 DIAGNOSIS — D649 Anemia, unspecified: Secondary | ICD-10-CM | POA: Diagnosis not present

## 2017-09-24 DIAGNOSIS — I724 Aneurysm of artery of lower extremity: Secondary | ICD-10-CM | POA: Diagnosis not present

## 2017-09-24 DIAGNOSIS — R55 Syncope and collapse: Secondary | ICD-10-CM

## 2017-09-24 DIAGNOSIS — I272 Pulmonary hypertension, unspecified: Secondary | ICD-10-CM

## 2017-09-24 DIAGNOSIS — N186 End stage renal disease: Secondary | ICD-10-CM

## 2017-09-24 DIAGNOSIS — J810 Acute pulmonary edema: Secondary | ICD-10-CM | POA: Diagnosis not present

## 2017-09-24 DIAGNOSIS — K209 Esophagitis, unspecified: Secondary | ICD-10-CM | POA: Diagnosis not present

## 2017-09-24 DIAGNOSIS — K921 Melena: Secondary | ICD-10-CM | POA: Diagnosis not present

## 2017-09-24 LAB — RENAL FUNCTION PANEL
ALBUMIN: 3.2 g/dL — AB (ref 3.5–5.0)
ANION GAP: 15 (ref 5–15)
Albumin: 3.2 g/dL — ABNORMAL LOW (ref 3.5–5.0)
Anion gap: 13 (ref 5–15)
BUN: 15 mg/dL (ref 6–20)
BUN: 16 mg/dL (ref 6–20)
CO2: 26 mmol/L (ref 22–32)
CO2: 28 mmol/L (ref 22–32)
Calcium: 8.2 mg/dL — ABNORMAL LOW (ref 8.9–10.3)
Calcium: 8.3 mg/dL — ABNORMAL LOW (ref 8.9–10.3)
Chloride: 96 mmol/L — ABNORMAL LOW (ref 101–111)
Chloride: 95 mmol/L — ABNORMAL LOW (ref 101–111)
Creatinine, Ser: 4.87 mg/dL — ABNORMAL HIGH (ref 0.61–1.24)
Creatinine, Ser: 5.4 mg/dL — ABNORMAL HIGH (ref 0.61–1.24)
GFR calc Af Amer: 14 mL/min — ABNORMAL LOW (ref >60)
GFR calc Af Amer: 13 mL/min — ABNORMAL LOW (ref >60)
GFR calc non Af Amer: 11 mL/min — ABNORMAL LOW (ref >60)
GFR, EST NON AFRICAN AMERICAN: 12 mL/min — AB (ref >60)
Glucose, Bld: 77 mg/dL (ref 65–99)
Glucose, Bld: 82 mg/dL (ref 65–99)
POTASSIUM: 5.1 mmol/L (ref 3.5–5.1)
Phosphorus: 2.9 mg/dL (ref 2.5–4.6)
Phosphorus: 2.7 mg/dL (ref 2.5–4.6)
Potassium: 3.5 mmol/L (ref 3.5–5.1)
Sodium: 137 mmol/L (ref 135–145)
Sodium: 136 mmol/L (ref 135–145)

## 2017-09-24 LAB — COMPREHENSIVE METABOLIC PANEL
ALT: <5 U/L — ABNORMAL LOW (ref 17–63)
AST: 18 U/L (ref 15–41)
Albumin: 2.9 g/dL — ABNORMAL LOW (ref 3.5–5.0)
Alkaline Phosphatase: 107 U/L (ref 38–126)
Anion gap: 14 (ref 5–15)
BILIRUBIN TOTAL: 0.5 mg/dL (ref 0.3–1.2)
BUN: 12 mg/dL (ref 6–20)
CHLORIDE: 94 mmol/L — AB (ref 101–111)
CO2: 28 mmol/L (ref 22–32)
Calcium: 8.2 mg/dL — ABNORMAL LOW (ref 8.9–10.3)
Creatinine, Ser: 4.63 mg/dL — ABNORMAL HIGH (ref 0.61–1.24)
GFR calc Af Amer: 15 mL/min — ABNORMAL LOW (ref >60)
GFR calc non Af Amer: 13 mL/min — ABNORMAL LOW (ref >60)
GLUCOSE: 86 mg/dL (ref 65–99)
POTASSIUM: 3.4 mmol/L — AB (ref 3.5–5.1)
Sodium: 136 mmol/L (ref 135–145)
Total Protein: 7 g/dL (ref 6.5–8.1)

## 2017-09-24 LAB — CBC
HCT: 25 % — ABNORMAL LOW (ref 39.0–52.0)
HEMATOCRIT: 25 % — AB (ref 39.0–52.0)
HEMOGLOBIN: 7.7 g/dL — AB (ref 13.0–17.0)
Hemoglobin: 7.6 g/dL — ABNORMAL LOW (ref 13.0–17.0)
MCH: 27.8 pg (ref 26.0–34.0)
MCH: 27.8 pg (ref 26.0–34.0)
MCHC: 30.8 g/dL (ref 30.0–36.0)
MCHC: 30.4 g/dL (ref 30.0–36.0)
MCV: 90.3 fL (ref 78.0–100.0)
MCV: 91.6 fL (ref 78.0–100.0)
Platelets: 89 10*3/uL — ABNORMAL LOW (ref 150–400)
Platelets: 84 10*3/uL — ABNORMAL LOW (ref 150–400)
RBC: 2.77 MIL/uL — ABNORMAL LOW (ref 4.22–5.81)
RBC: 2.73 MIL/uL — ABNORMAL LOW (ref 4.22–5.81)
RDW: 18.4 % — ABNORMAL HIGH (ref 11.5–15.5)
RDW: 18.6 % — ABNORMAL HIGH (ref 11.5–15.5)
WBC: 5.3 10*3/uL (ref 4.0–10.5)
WBC: 4.7 K/uL (ref 4.0–10.5)

## 2017-09-24 LAB — MRSA PCR SCREENING: MRSA by PCR: NEGATIVE

## 2017-09-24 LAB — CBC WITH DIFFERENTIAL/PLATELET
Abs Immature Granulocytes: 0 10*3/uL (ref 0.0–0.1)
BASOS PCT: 0 %
Basophils Absolute: 0 10*3/uL (ref 0.0–0.1)
EOS ABS: 0 10*3/uL (ref 0.0–0.7)
Eosinophils Relative: 0 %
HEMATOCRIT: 21.6 % — AB (ref 39.0–52.0)
Hemoglobin: 6.6 g/dL — CL (ref 13.0–17.0)
IMMATURE GRANULOCYTES: 1 %
LYMPHS ABS: 0.3 10*3/uL — AB (ref 0.7–4.0)
Lymphocytes Relative: 6 %
MCH: 27.7 pg (ref 26.0–34.0)
MCHC: 30.6 g/dL (ref 30.0–36.0)
MCV: 90.8 fL (ref 78.0–100.0)
MONO ABS: 0.3 10*3/uL (ref 0.1–1.0)
MONOS PCT: 5 %
NEUTROS PCT: 88 %
Neutro Abs: 4.7 10*3/uL (ref 1.7–7.7)
Platelets: 84 10*3/uL — ABNORMAL LOW (ref 150–400)
RBC: 2.38 MIL/uL — ABNORMAL LOW (ref 4.22–5.81)
RDW: 18.5 % — AB (ref 11.5–15.5)
WBC: 5.3 10*3/uL (ref 4.0–10.5)

## 2017-09-24 LAB — PROTIME-INR
INR: 2.39
Prothrombin Time: 25.9 seconds — ABNORMAL HIGH (ref 11.4–15.2)

## 2017-09-24 LAB — TROPONIN I
TROPONIN I: 0.04 ng/mL — AB (ref <0.03)
Troponin I: 0.04 ng/mL (ref <0.03)

## 2017-09-24 LAB — POC OCCULT BLOOD, ED: FECAL OCCULT BLD: POSITIVE — AB

## 2017-09-24 LAB — PREPARE RBC (CROSSMATCH)

## 2017-09-24 MED ORDER — SODIUM CHLORIDE 0.9 % IV SOLN
100.0000 mL | INTRAVENOUS | Status: DC | PRN
  Administered 2017-09-25: 10:00:00 via INTRAVENOUS

## 2017-09-24 MED ORDER — LEVOTHYROXINE SODIUM 100 MCG PO TABS
300.0000 ug | ORAL_TABLET | Freq: Every day | ORAL | Status: DC
  Administered 2017-09-25 – 2017-09-26 (×2): 300 ug via ORAL
  Filled 2017-09-24: qty 3
  Filled 2017-09-24: qty 2
  Filled 2017-09-24: qty 3
  Filled 2017-09-24: qty 2

## 2017-09-24 MED ORDER — AMBRISENTAN 5 MG PO TABS
10.0000 mg | ORAL_TABLET | Freq: Every day | ORAL | Status: DC
  Administered 2017-09-24: 10 mg via ORAL
  Filled 2017-09-24 (×5): qty 2

## 2017-09-24 MED ORDER — AMIODARONE HCL 200 MG PO TABS
200.0000 mg | ORAL_TABLET | Freq: Every evening | ORAL | Status: DC
  Administered 2017-09-24 – 2017-09-25 (×2): 200 mg via ORAL
  Filled 2017-09-24 (×2): qty 1

## 2017-09-24 MED ORDER — DARBEPOETIN ALFA 200 MCG/0.4ML IJ SOSY
200.0000 ug | PREFILLED_SYRINGE | INTRAMUSCULAR | Status: DC
  Filled 2017-09-24: qty 0.4

## 2017-09-24 MED ORDER — PENTAFLUOROPROP-TETRAFLUOROETH EX AERO: 1.0000 "application " | INHALATION_SPRAY | CUTANEOUS | Status: DC | PRN

## 2017-09-24 MED ORDER — HEPARIN SODIUM (PORCINE) 1000 UNIT/ML DIALYSIS
1000.0000 [IU] | INTRAMUSCULAR | Status: DC | PRN
Start: 2017-09-24 — End: 2017-09-26
  Administered 2017-09-26: 1000 [IU] via INTRAVENOUS_CENTRAL

## 2017-09-24 MED ORDER — PANTOPRAZOLE SODIUM 40 MG PO TBEC
40.0000 mg | DELAYED_RELEASE_TABLET | Freq: Every day | ORAL | Status: DC
  Administered 2017-09-24: 40 mg via ORAL
  Filled 2017-09-24: qty 1

## 2017-09-24 MED ORDER — ALTEPLASE 2 MG IJ SOLR
2.0000 mg | Freq: Once | INTRAMUSCULAR | Status: DC | PRN
  Filled 2017-09-24: qty 2

## 2017-09-24 MED ORDER — ATORVASTATIN CALCIUM 40 MG PO TABS
40.0000 mg | ORAL_TABLET | Freq: Every evening | ORAL | Status: DC
  Administered 2017-09-24 – 2017-09-25 (×2): 40 mg via ORAL
  Filled 2017-09-24 (×3): qty 1

## 2017-09-24 MED ORDER — TADALAFIL (PAH) 20 MG PO TABS
20.0000 mg | ORAL_TABLET | Freq: Every day | ORAL | Status: DC
  Administered 2017-09-24: 20 mg via ORAL
  Filled 2017-09-24 (×2): qty 1

## 2017-09-24 MED ORDER — CINACALCET HCL 30 MG PO TABS
30.0000 mg | ORAL_TABLET | Freq: Every day | ORAL | Status: DC
  Administered 2017-09-25: 30 mg via ORAL
  Filled 2017-09-24 (×2): qty 1

## 2017-09-24 MED ORDER — CEFAZOLIN IV (FOR PTA / DISCHARGE USE ONLY): 2.0000 g | INTRAVENOUS | Status: DC | PRN

## 2017-09-24 MED ORDER — ACETAMINOPHEN 650 MG RE SUPP: 650.0000 mg | Freq: Four times a day (QID) | RECTAL | Status: DC | PRN

## 2017-09-24 MED ORDER — SODIUM CHLORIDE 0.9 % IV SOLN
Freq: Once | INTRAVENOUS | Status: AC
  Administered 2017-09-24: 04:00:00 via INTRAVENOUS

## 2017-09-24 MED ORDER — PANTOPRAZOLE SODIUM 40 MG PO TBEC
40.0000 mg | DELAYED_RELEASE_TABLET | Freq: Two times a day (BID) | ORAL | Status: DC
  Administered 2017-09-24 – 2017-09-26 (×4): 40 mg via ORAL
  Filled 2017-09-24 (×4): qty 1

## 2017-09-24 MED ORDER — LIDOCAINE HCL (PF) 1 % IJ SOLN
5.0000 mL | INTRAMUSCULAR | Status: DC | PRN
  Filled 2017-09-24: qty 5

## 2017-09-24 MED ORDER — SODIUM CHLORIDE 0.9 % IV SOLN
100.0000 mL | INTRAVENOUS | Status: DC | PRN
Start: 2017-09-24 — End: 2017-09-26

## 2017-09-24 MED ORDER — VITAMIN K1 10 MG/ML IJ SOLN
10.0000 mg | Freq: Once | INTRAVENOUS | Status: AC
  Administered 2017-09-24: 10 mg via INTRAVENOUS
  Filled 2017-09-24: qty 1

## 2017-09-24 MED ORDER — BOOST / RESOURCE BREEZE PO LIQD CUSTOM
1.0000 | Freq: Three times a day (TID) | ORAL | Status: DC
  Administered 2017-09-24 – 2017-09-25 (×2): 1 via ORAL

## 2017-09-24 MED ORDER — ACETAMINOPHEN 325 MG PO TABS: 650.0000 mg | ORAL_TABLET | Freq: Four times a day (QID) | ORAL | Status: DC | PRN

## 2017-09-24 MED ORDER — LIDOCAINE-PRILOCAINE 2.5-2.5 % EX CREA: 1.0000 "application " | TOPICAL_CREAM | CUTANEOUS | Status: DC | PRN

## 2017-09-24 MED ORDER — CALCIUM ACETATE (PHOS BINDER) 667 MG PO CAPS
1334.0000 mg | ORAL_CAPSULE | Freq: Three times a day (TID) | ORAL | Status: DC
  Administered 2017-09-24: 1334 mg via ORAL
  Filled 2017-09-24 (×5): qty 2

## 2017-09-24 MED ORDER — CEFAZOLIN SODIUM-DEXTROSE 2-4 GM/100ML-% IV SOLN
2.0000 g | INTRAVENOUS | Status: DC
  Filled 2017-09-24: qty 100

## 2017-09-24 NOTE — Consult Note (Signed)
[facility] Nurse wound consult note Reason for Consult: Left antecubital site VAC dressing change and supplies. Orders for the following have been entered:  VAC machine, VAC cannister Kellie Simmering [phone]), small black foam dressing Kellie Simmering [phone]), Mepitel dressings Kellie Simmering [phone]).  I plan to change the dressing to maintain the patient on his Tues-Thurs-Sat change schedule when the supplies arrive. Val Riles, RN, MSN, CWOCN, CNS-BC, pager [phone]

## 2017-09-24 NOTE — Consult Note (Addendum)
[facility] Renal Consultation Note    Indication for Consultation:  Management of ESRD/hemodialysis; anemia, hypertension/volume and secondary hyperparathyroidism PCP:  HPI: Adam [name]. is a 57 y.o. male with ESRD on hemodialysis MWF at [facility]. PMH Renal transplant 2009, failed renal transplant 2010, Afib, pulmonary hypertension, chronic hypotension on midodrine, Non-ischemic cardiomyopathy EF 60-65%, thyroid cancer, AOCD, SHPT.Admit  [facility] 04/08-04/19/19 MSSA sepsis-removal of AVF/ICD. ARF, acute thrombocytopenia, pulmonary HTN on ambrisentan and Tadalafil. He was formerly home hemodialysis patient but had to resume HD in center D/T removal of AF. Recent ED visit 09/07/2017 with supratherapeutic INR 10.8 with epistaxis. Last HD 09/23/2017-got to EDW.   Patient seen in ED. Says he is having issues with post nasal drainage and started coughing. Apparently he developed generalized muscle jerking and staring out into space lasting approximately 1 minute per wife. He was alert, oriented following episode, has never had issues with seizure activity prior to this event. He was brought to ED via EMS for evaluation. CT of the head showed no acute abnormalities-sinus issues. HGB was noted to be 6.6. Last HGB at HD center was 7.7 09/16/2017. He rec'd 1 unit of PRBC in ED. Rest of labs essentially unremarkable for ESRD patient. FOBT positive. CXR this AM shows vascular congestion, mild cardiomegaly.  Seen in ED, appears at baseline with no new complaints, NAD. He has been admitted as observation patient for pre-syncope. He did have higher goal at HD yesterday but shortened treatment 09/21/2017. He left at usual OP EDW 124.5 kg.   Past Medical History:  Diagnosis Date  . AICD (automatic cardioverter/defibrillator) present    [employer]tr[employer]   . Aortic stenosis    moderate AS by 08/2016 echo  . Arthritis    "knees" (08/05/2016)  . Atrial fibrillation ([place])   .  ESRD (end stage renal disease) on dialysis ([place])    "M, T, W, T, F; NX stage hemodialysis; I do it at home" (08/05/2016)  . Great toe amputation status ([place])    status post left hallux amputation 08/01/14  . History of blood transfusion 2009   "S/P biopsy for prostate cancer check"  . Hypertension   . Hypothyroidism (acquired)   . Mitral stenosis    moderate mitral stenosis  . Nonischemic cardiomyopathy ([place])   . Pneumonia 2014; 08/05/2016  . PONV (postoperative nausea and vomiting)   . Pulmonary HTN ([place])   . Renal insufficiency   . Thyroid cancer ([place]) 2011   Past Surgical History:  Procedure Laterality Date  . ARTERIAL LINE INSERTION Right 08/24/2017   Procedure: ARTERIAL LINE INSERTION INTO RIGHT FEMORAL ARTERY;  Surgeon: Conrad Bangor, MD;  Location: [place];  Service: Vascular;  Laterality: Right;  . CARDIAC CATHETERIZATION Right 2017  . CARDIAC DEFIBRILLATOR PLACEMENT  2009  . DIALYSIS FISTULA CREATION Left 2003   "upper arm"  . DIALYSIS/PERMA CATHETER REMOVAL Left 09/01/2017   Procedure: DIALYSIS/PERMA CATHETER REMOVAL;  Surgeon: Conrad Ponca, MD;  Location: [place];  Service: Vascular;  Laterality: Left;  . HERNIA REPAIR    . ICD LEAD REMOVAL Right 08/20/2017   Procedure: ICD EXTRACTION WITH C-ARM;  Surgeon: Evans Lance, MD;  Location[facility] OR;  Service: Cardiovascular;  Laterality: Right;  DR VAN TRIGT TO BACK-UP  . INSERTION OF DIALYSIS CATHETER N/A 11/11/2016   Procedure: INSERTION OF TUNNELED DIALYSIS CATHETER;  Surgeon: Angelia Mould, MD;  Location: [facility] OR;  Service: Vascular;  Laterality: N/A;  . INSERTION OF DIALYSIS CATHETER Left  08/24/2017   Procedure: INSERTION OF DIALYSIS CATHETER;  Surgeon: Conrad Bruceville, MD;  Location: [place];  Service: Vascular;  Laterality: Left;  . INSERTION OF DIALYSIS CATHETER Left 09/01/2017   Procedure: INSERTION OF DIALYSIS CATHETER;  Surgeon: Conrad Bland, MD;  Location: [place];  Service: Vascular;  Laterality: Left;  . KIDNEY  TRANSPLANT  2009  . KNEE SURGERY Bilateral 2000s   "drained fluid"  . LAPAROSCOPIC GASTRIC BANDING  2006  . PROSTATE BIOPSY  2009  . REVISON OF ARTERIOVENOUS FISTULA Left 08/08/5186   Procedure: PLICATION OF ARTERIOVENOUS FISTULA ANEURYSM;  Surgeon: Elam Dutch, MD;  Location: [place];  Service: Vascular;  Laterality: Left;  . REVISON OF ARTERIOVENOUS FISTULA Left 11/10/2016   Procedure: REVISON OF ARTERIOVENOUS FISTULA - LEFT UPPER ARM;  Surgeon: Elam Dutch, MD;  Location: [place];  Service: Vascular;  Laterality: Left;  . REVISON OF ARTERIOVENOUS FISTULA Left 08/24/2017   Procedure: REVISON OF ARTERIOVENOUS FISTULA LEFT;  Surgeon: Conrad Hunters Creek, MD;  Location: [place];  Service: Vascular;  Laterality: Left;  . TEE WITHOUT CARDIOVERSION N/A 08/20/2017   Procedure: TRANSESOPHAGEAL ECHOCARDIOGRAM (TEE);  Surgeon: Evans Lance, MD;  Location: [place];  Service: Cardiovascular;  Laterality: N/A;  . THROMBECTOMY W/ EMBOLECTOMY Left 12/25/2014   Procedure: THROMBECTOMY ARTERIOVENOUS FISTULA;  Surgeon: Elam Dutch, MD;  Location: [place];  Service: Vascular;  Laterality: Left;  . THYROIDECTOMY  2011  . TOE AMPUTATION Bilateral     right 1st and  2nd digits; left 1st and 3rd digits"  . UMBILICAL HERNIA REPAIR  X 2  . VEIN HARVEST Left 08/24/2017   Procedure: VEIN HARVEST;  Surgeon: Conrad Avera, MD;  Location: [facility] OR;  Service: Vascular;  Laterality: Left;   Family History  Problem Relation Age of Onset  . Heart failure Mother   . Hypertension Mother   . CAD Mother 43  . Emphysema Mother   . Hypertension Father   . Kidney failure Father    Social History:  reports that he has never smoked. He has never used smokeless tobacco. He reports that he drinks alcohol. He reports that he does not use drugs. Allergies  Allergen Reactions  . Enalaprilat Swelling    Mouth swelling  . Vasotec [Enalapril] Swelling    MOUTH SWELLING  . Iodinated Diagnostic Agents Nausea And Vomiting and Nausea  Only   Prior to Admission medications   Medication Sig Start Date End Date Taking? Authorizing Provider  ambrisentan (LETAIRIS) 10 MG tablet Take 1 tablet (10 mg total) by mouth daily. 09/11/17  Yes Juanito Doom, MD  amiodarone (PACERONE) 200 MG tablet Take 200 mg by mouth every evening.    Yes [provider]  atorvastatin (LIPITOR) 40 MG tablet Take 1 tablet (40 mg total) by mouth every evening. 06/15/17  Yes Diallo, Abdoulaye, MD  ceFAZolin (ANCEF) IVPB Inject 2 g into the vein every dialysis (GIVE AT END OF EACH HD SESSION (MWF schedule)). Indication: MSSA Bacteremia Last Day of Therapy: 10/05/17 Labs - Once weekly:  CBC/D and BMP, Labs - Every other week:  ESR and CRP 08/28/17 10/05/17 Yes Shah, Pratik D, DO  cinacalcet (SENSIPAR) 90 MG tablet Take 90 mg by mouth every evening.    Yes [provider]  levothyroxine (SYNTHROID, LEVOTHROID) 300 MCG tablet Take 1 tablet (300 mcg total) by mouth daily. 06/09/17  Yes Renato Shin, MD  midodrine (PROAMATINE) 10 MG tablet Take 1 tablet (10 mg total) by mouth  3 (three) times daily. 09/02/16  Yes Minus Breeding, MD  tadalafil, PAH, (ADCIRCA) 20 MG tablet 1 tab daily Patient taking differently: Take 20 mg by mouth daily.  12/05/16  Yes Juanito Doom, MD  warfarin (COUMADIN) 6 MG tablet Take 1 tablet (6 mg total) by mouth one time only at 6 PM. Patient not taking: Reported on 09/23/2017 08/28/17 09/27/17  Heath Lark D, DO   Current Facility-Administered Medications  Medication Dose Route Frequency Provider Last Rate Last Dose  . acetaminophen (TYLENOL) tablet 650 mg  650 mg Oral Q6H PRN Sherene Sires, DO       Or  . acetaminophen (TYLENOL) suppository 650 mg  650 mg Rectal Q6H PRN Sherene Sires, DO      . ambrisentan (LETAIRIS) tablet 10 mg  10 mg Oral Daily Bland, Scott, DO      . amiodarone (PACERONE) tablet 200 mg  200 mg Oral QPM Bland, Scott, DO      . atorvastatin (LIPITOR) tablet 40 mg  40 mg Oral QPM Bland, Scott, DO       . [START ON 09/25/2017] ceFAZolin (ANCEF) IVPB 2g/100 mL premix  2 g Intravenous Q M,W,F-HD Alveda Reasons, MD      . levothyroxine (SYNTHROID, LEVOTHROID) tablet 300 mcg  300 mcg Oral QAC breakfast Bland, Scott, DO      . pantoprazole (PROTONIX) EC tablet 40 mg  40 mg Oral Daily Bland, Scott, DO   40 mg at 09/24/17 0940  . tadalafil (PAH) (ADCIRCA) tablet 20 mg  20 mg Oral Daily Valentina Gu, NP       Current Outpatient Medications  Medication Sig Dispense Refill  . ambrisentan (LETAIRIS) 10 MG tablet Take 1 tablet (10 mg total) by mouth daily. 30 tablet 11  . amiodarone (PACERONE) 200 MG tablet Take 200 mg by mouth every evening.     Marland Kitchen atorvastatin (LIPITOR) 40 MG tablet Take 1 tablet (40 mg total) by mouth every evening. 30 tablet 2  . ceFAZolin (ANCEF) IVPB Inject 2 g into the vein every dialysis (GIVE AT END OF EACH HD SESSION (MWF schedule)). Indication: MSSA Bacteremia Last Day of Therapy: 10/05/17 Labs - Once weekly:  CBC/D and BMP, Labs - Every other week:  ESR and CRP 30 Units 0  . cinacalcet (SENSIPAR) 90 MG tablet Take 90 mg by mouth every evening.     Marland Kitchen levothyroxine (SYNTHROID, LEVOTHROID) 300 MCG tablet Take 1 tablet (300 mcg total) by mouth daily. 90 tablet 3  . midodrine (PROAMATINE) 10 MG tablet Take 1 tablet (10 mg total) by mouth 3 (three) times daily. 90 tablet 6  . tadalafil, PAH, (ADCIRCA) 20 MG tablet 1 tab daily (Patient taking differently: Take 20 mg by mouth daily. ) 30 tablet 11  . warfarin (COUMADIN) 6 MG tablet Take 1 tablet (6 mg total) by mouth one time only at 6 PM. (Patient not taking: Reported on 09/23/2017) 30 tablet 0   Labs: Basic Metabolic Panel: Recent Labs  Lab 09/23/17 2350 09/24/17 0757  NA 136 137  K 3.4* 5.1  CL 94* 96*  CO2 28 26  GLUCOSE 86 77  BUN 12 15  CREATININE 4.63* 4.87*  CALCIUM 8.2* 8.2*  PHOS  --  2.9   Liver Function Tests: Recent Labs  Lab 09/23/17 2350 09/24/17 0757  AST 18  --   ALT <5*  --   ALKPHOS 107   --   BILITOT 0.5  --   PROT 7.0  --  ALBUMIN 2.9* 3.2*   No results for input(s): LIPASE, AMYLASE in the last 168 hours. No results for input(s): AMMONIA in the last 168 hours. CBC: Recent Labs  Lab 09/23/17 2350 09/24/17 0757  WBC 5.3 5.3  NEUTROABS 4.7  --   HGB 6.6* 7.7*  HCT 21.6* 25.0*  MCV 90.8 90.3  PLT 84* 89*   Cardiac Enzymes: Recent Labs  Lab 09/23/17 2350 09/24/17 0757  TROPONINI 0.04* 0.04*   CBG: Recent Labs  Lab 09/23/17 2323  GLUCAP 90   Iron Studies: No results for input(s): IRON, TIBC, TRANSFERRIN, FERRITIN in the last 72 hours. Studies/Results: Dg Chest 2 View  Result Date: 09/24/2017 CLINICAL DATA:  Acute onset of syncope. EXAM: CHEST - 2 VIEW COMPARISON:  Chest radiograph performed 09/01/2017 FINDINGS: The lungs are well-aerated. Vascular congestion is noted. There is no evidence of focal opacification, pleural effusion or pneumothorax. The heart is mildly enlarged. No acute osseous abnormalities are seen. A left-sided dual-lumen catheter is noted ending about the cavoatrial junction. IMPRESSION: Vascular congestion and mild cardiomegaly; lungs remain grossly clear. Electronically Signed   By: Garald Balding M.D.   On: 09/24/2017 01:02   Ct Head Wo Contrast  Result Date: 09/24/2017 CLINICAL DATA:  57 y/o  M; syncope with seizure-like activity. EXAM: CT HEAD WITHOUT CONTRAST TECHNIQUE: Contiguous axial images were obtained from the base of the skull through the vertex without intravenous contrast. COMPARISON:  04/14/2016 sinus CT. FINDINGS: Brain: No evidence of acute infarction, hemorrhage, hydrocephalus, extra-axial collection or mass lesion/mass effect. Vascular: Extensive calcification of carotid siphons, vertebral arteries, in median vessels of scalp and visible face. Skull: Normal. Negative for fracture or focal lesion. Sinuses/Orbits: Near absence of bony structures of the nasal passages including nasal septum and turbinates. Near complete absence  of ethmoid air cells and large defect within the left maxillary sinus medial wall. Apparent bony dehiscence of the right lamina papyracea and of the right-greater-than-left olfactory grooves. Moderate mucosal thickening of the walls of the open nasal cavity and sphenoid sinuses with opacification of the right maxillary sinus. Chronic inflammatory changes of the walls of the sinuses. Findings are similar to prior sinus CT given differences in technique. Normal aeration of mastoid air cells.  Orbits are unremarkable. Other: None. IMPRESSION: 1. No acute intracranial abnormality. 2. 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 Extensive paranasal sinus disease of the residual sinuses. Findings are similar to prior CT sinus. Electronically Signed   By: Kristine Garbe M.D.   On: 09/24/2017 00:47    ROS: As per HPI otherwise negative.   Physical Exam: Vitals:   09/24/17 0730 09/24/17 0830 09/24/17 0900 09/24/17 1000  BP: 109/83 113/85 117/82 103/73  Pulse:      Resp: '16 18 13 11  '$ Temp:      TempSrc:      SpO2:      Weight:      Height:         General: Well developed, well nourished, in no acute distress. Head: Normocephalic, atraumatic, sclera non-icteric, mucus membranes are moist Neck: Supple. Slight JVD at 30 degrees.  Lungs: Clear bilaterally to auscultation without wheezes, rales, or rhonchi. Breathing is unlabored. Heart: RRR with S1 S2 3/6 systolic M. No R/G. SR on monitor.   Abdomen: Soft, non-tender, non-distended with normoactive bowel sounds. No rebound/guarding. No obvious abdominal masses. M-S:  Strength and tone appear normal for age. Lower extremities: Trace RLE edema, no ischemic changes, no open wounds  Neuro: Alert and oriented  X 3. Moves all extremities spontaneously. Psych:  Responds to questions appropriately with a normal affect. Dialysis Access: [facility] drsg CDI.   Dialysis Orders: [facility] MWF 4 hrs 180 NRe 400/800 124.5 kg 2.0 K/ 2.25 Ca  -No  Heparin -Mircera 225 mcg IV q 2 weeks (last dose 09/11/2017 Last HGB 7.7 09/16/2017)  Assessment/Plan: 1.  Presyncope/possible vasovagal event from excessive coughing. CT of head neg. No prior history of seizure activity. Per primary 2.  Possible GIB-FOBT positive. HGB 6.6 on adm. Rec'd 1 unit PRBCs now HGB 7.7 Has had issues with anemia since 08/31/2017. Was scheduled for colonoscopy next week with GI. May need to expedite procedure. Per primary.  3. Chronic Afib-ACID removed. Has had issues with elevated INR while on ABX for MSSA infection. Per primary. On amiodarone.  4. MSSA sepsis-has continued on cefazolin 2 grams IV q treatment for a total of 12 doses, last dose due 09/28/2017.  5.  ESRD -  MWF via [facility]. Next HD 09/25/2017. K+ 5.1. 2.0 K bath no heparin  6.  Hypertension/volume  - BP controlled. Vascular congestion noted on CXR-usually gets to EDW-high gains. Current hospital wt 122.5 kg-unsure how this was obtained. Get standing wt with HD tomorrow. May need to lower EDW.  7.  Anemia  - See # 2. ESA due tomorrow-give Aranesp 200 mcg IV with HD tomorrow. Follow HGB.  8.  Metabolic bone disease - Ca 8.2 C Ca 8.8 Phos 2.9 decrease binder dose and continue. Change senispar to 30 mg QOD.  9.  Nutrition - Albumin 3.2. Renal diet, nepro, renal vit 10.  H/O pulmonary HTN on ambrisentan and Tadalafil.    Rita H. Owens Shark, NP-C 09/24/2017, 10:38 AM  [facility] Beeper [phone]  Pt seen, examined and agree w A/P as above. ESRD pt with presyncopal episode after coughing spell.  Suspected vasovagal w/ BP drop, stable now in ED, getting prbc's for sig anemia, for EGD tomorrow reportedly.  Will plan HD tomorrow as well.  Will follow.  Kelly Splinter MD [employer] pager [phone]   09/24/2017, 3:58 PM

## 2017-09-24 NOTE — ED Notes (Signed)
Attempted report 

## 2017-09-24 NOTE — ED Notes (Signed)
Tadalifil is not ordered, PT reports he takes this at the same time as ambrisentan and would like to wait to take them together, order for tadalifil will be requested

## 2017-09-24 NOTE — Consult Note (Signed)
[facility] Consult  Referring Provider: Alveda Reasons, MD Primary Care Physician:  [name], MD Primary Gastroenterologist: Althia Forts  Reason for Consultation:  Anemia and dark tarry stool HPI: Adam [name]. is a 57 y.o. male presented to the ER with low blood count. Yesterday evening he had some jerking motion of his hands, possible seizure and came to the ER where he was found to have a hemoglobin of 6.6 with a baseline hemoglobin of 8, was given 1 unit PRBC transfusion. He states for the last 2 weeks the stools have been black and tarry 3-5 times a day, not associated with abdominal pain, nausea, vomiting, difficulty swallowing, pain on swallowing or frank rectal bleeding.he is usually on Coumadin for atrial fibrillation which was last taken on Sunday. Patient was recently admitted for MSSA infection of ICD and AV fistula, needed fistula removal and placement of a dialysis catheter,removal of ICD. Patient states that he was started on antibiotics and due to drug interaction his INR levels were very high for the past several weeks. No prior endoscopy or colonoscopy. He was supposed to see [facility] as an outpatient in 2 weeks to setup a screening colonoscopy.   Past Medical History:  Diagnosis Date  . AICD (automatic cardioverter/defibrillator) present    [employer]   . Aortic stenosis    moderate AS by 08/2016 echo  . Arthritis    "knees" (08/05/2016)  . Atrial fibrillation ([place])   . ESRD (end stage renal disease) on dialysis ([place])    "M, T, W, T, F; NX stage hemodialysis; I do it at home" (08/05/2016)  . Great toe amputation status ([name])    status post left hallux amputation 08/01/14  . History of blood transfusion 2009   "S/P biopsy for prostate cancer check"  . Hypertension   . Hypothyroidism (acquired)   . Mitral stenosis    moderate mitral stenosis  . Nonischemic cardiomyopathy ([name])   . Pneumonia 2014; 08/05/2016  . PONV (postoperative nausea  and vomiting)   . Pulmonary HTN ([name])   . Renal insufficiency   . Thyroid cancer ([name]) 2011    Past Surgical History:  Procedure Laterality Date  . ARTERIAL LINE INSERTION Right 08/24/2017   Procedure: ARTERIAL LINE INSERTION INTO RIGHT FEMORAL ARTERY;  Surgeon: Conrad Caldwell, MD;  Location: [place];  Service: Vascular;  Laterality: Right;  . CARDIAC CATHETERIZATION Right 2017  . CARDIAC DEFIBRILLATOR PLACEMENT  2009  . DIALYSIS FISTULA CREATION Left 2003   "upper arm"  . DIALYSIS/PERMA CATHETER REMOVAL Left 09/01/2017   Procedure: DIALYSIS/PERMA CATHETER REMOVAL;  Surgeon: Conrad Atlanta, MD;  Location: [place];  Service: Vascular;  Laterality: Left;  . HERNIA REPAIR    . ICD LEAD REMOVAL Right 08/20/2017   Procedure: ICD EXTRACTION WITH C-ARM;  Surgeon: Evans Lance, MD;  Location: [facility];  Service: Cardiovascular;  Laterality: Right;  DR VAN TRIGT TO BACK-UP  . INSERTION OF DIALYSIS CATHETER N/A 11/11/2016   Procedure: INSERTION OF TUNNELED DIALYSIS CATHETER;  Surgeon: Angelia Mould, MD;  Location: [place];  Service: Vascular;  Laterality: N/A;  . INSERTION OF DIALYSIS CATHETER Left 08/24/2017   Procedure: INSERTION OF DIALYSIS CATHETER;  Surgeon: Conrad Tatum, MD;  Location: [place];  Service: Vascular;  Laterality: Left;  . INSERTION OF DIALYSIS CATHETER Left 09/01/2017   Procedure: INSERTION OF DIALYSIS CATHETER;  Surgeon: Conrad Oketo, MD;  Location: [place];  Service: Vascular;  Laterality: Left;  . KIDNEY TRANSPLANT  2009  . KNEE SURGERY Bilateral 2000s   "drained fluid"  . LAPAROSCOPIC GASTRIC BANDING  2006  . PROSTATE BIOPSY  2009  . REVISON OF ARTERIOVENOUS FISTULA Left 4/43/1540   Procedure: PLICATION OF ARTERIOVENOUS FISTULA ANEURYSM;  Surgeon: Elam Dutch, MD;  Location: [place];  Service: Vascular;  Laterality: Left;  . REVISON OF ARTERIOVENOUS FISTULA Left 11/10/2016   Procedure: REVISON OF ARTERIOVENOUS FISTULA - LEFT UPPER ARM;  Surgeon: Elam Dutch, MD;   Location: [place];  Service: Vascular;  Laterality: Left;  . REVISON OF ARTERIOVENOUS FISTULA Left 08/24/2017   Procedure: REVISON OF ARTERIOVENOUS FISTULA LEFT;  Surgeon: Conrad Pickens, MD;  Location: [place];  Service: Vascular;  Laterality: Left;  . TEE WITHOUT CARDIOVERSION N/A 08/20/2017   Procedure: TRANSESOPHAGEAL ECHOCARDIOGRAM (TEE);  Surgeon: Evans Lance, MD;  Location: [place];  Service: Cardiovascular;  Laterality: N/A;  . THROMBECTOMY W/ EMBOLECTOMY Left 12/25/2014   Procedure: THROMBECTOMY ARTERIOVENOUS FISTULA;  Surgeon: Elam Dutch, MD;  Location: [place];  Service: Vascular;  Laterality: Left;  . THYROIDECTOMY  2011  . TOE AMPUTATION Bilateral     right 1st and  2nd digits; left 1st and 3rd digits"  . UMBILICAL HERNIA REPAIR  X 2  . VEIN HARVEST Left 08/24/2017   Procedure: VEIN HARVEST;  Surgeon: Conrad Puxico, MD;  Location: [place];  Service: Vascular;  Laterality: Left;    Prior to Admission medications   Medication Sig Start Date End Date Taking? Authorizing Provider  ambrisentan (LETAIRIS) 10 MG tablet Take 1 tablet (10 mg total) by mouth daily. 09/11/17  Yes Juanito Doom, MD  amiodarone (PACERONE) 200 MG tablet Take 200 mg by mouth every evening.    Yes [provider]  atorvastatin (LIPITOR) 40 MG tablet Take 1 tablet (40 mg total) by mouth every evening. 06/15/17  Yes Diallo, Abdoulaye, MD  ceFAZolin (ANCEF) IVPB Inject 2 g into the vein every dialysis (GIVE AT END OF EACH HD SESSION (MWF schedule)). Indication: MSSA Bacteremia Last Day of Therapy: 10/05/17 Labs - Once weekly:  CBC/D and BMP, Labs - Every other week:  ESR and CRP 08/28/17 10/05/17 Yes Shah, Pratik D, DO  cinacalcet (SENSIPAR) 90 MG tablet Take 90 mg by mouth every evening.    Yes [provider]  levothyroxine (SYNTHROID, LEVOTHROID) 300 MCG tablet Take 1 tablet (300 mcg total) by mouth daily. 06/09/17  Yes Renato Shin, MD  midodrine (PROAMATINE) 10 MG tablet Take 1 tablet (10 mg  total) by mouth 3 (three) times daily. 09/02/16  Yes Minus Breeding, MD  tadalafil, PAH, (ADCIRCA) 20 MG tablet 1 tab daily Patient taking differently: Take 20 mg by mouth daily.  12/05/16  Yes Juanito Doom, MD  warfarin (COUMADIN) 6 MG tablet Take 1 tablet (6 mg total) by mouth one time only at 6 PM. Patient not taking: Reported on 09/23/2017 08/28/17 09/27/17  Heath Lark D, DO    Current Facility-Administered Medications  Medication Dose Route Frequency Provider Last Rate Last Dose  . acetaminophen (TYLENOL) tablet 650 mg  650 mg Oral Q6H PRN Sherene Sires, DO       Or  . acetaminophen (TYLENOL) suppository 650 mg  650 mg Rectal Q6H PRN Sherene Sires, DO      . ambrisentan (LETAIRIS) tablet 10 mg  10 mg Oral Daily Bland, Scott, DO   10 mg at 09/24/17 1124  . amiodarone (PACERONE) tablet 200 mg  200 mg Oral QPM Bland, Scott, DO      .  atorvastatin (LIPITOR) tablet 40 mg  40 mg Oral QPM Bland, Scott, DO      . calcium acetate (PHOSLO) capsule 1,334 mg  1,334 mg Oral TID WC Valentina Gu, NP      . Derrill Memo ON 09/25/2017] ceFAZolin (ANCEF) IVPB 2g/100 mL premix  2 g Intravenous Q M,W,F-HD Alveda Reasons, MD      . Derrill Memo ON 09/25/2017] cinacalcet (SENSIPAR) tablet 30 mg  30 mg Oral Q supper Valentina Gu, NP      . Derrill Memo ON 09/25/2017] Darbepoetin Alfa (ARANESP) injection 200 mcg  200 mcg Intravenous Q Fri-HD Valentina Gu, NP      . levothyroxine (SYNTHROID, LEVOTHROID) tablet 300 mcg  300 mcg Oral QAC breakfast Bland, Scott, DO      . pantoprazole (PROTONIX) EC tablet 40 mg  40 mg Oral BID AC Ronnette Juniper, MD      . phytonadione (VITAMIN K) 10 mg in dextrose 5 % 50 mL IVPB  10 mg Intravenous Once Ronnette Juniper, MD      . tadalafil (PAH) (ADCIRCA) tablet 20 mg  20 mg Oral Daily Valentina Gu, NP   20 mg at 09/24/17 1124   Current Outpatient Medications  Medication Sig Dispense Refill  . ambrisentan (LETAIRIS) 10 MG tablet Take 1 tablet (10 mg total) by mouth daily.  30 tablet 11  . amiodarone (PACERONE) 200 MG tablet Take 200 mg by mouth every evening.     Marland Kitchen atorvastatin (LIPITOR) 40 MG tablet Take 1 tablet (40 mg total) by mouth every evening. 30 tablet 2  . ceFAZolin (ANCEF) IVPB Inject 2 g into the vein every dialysis (GIVE AT END OF EACH HD SESSION (MWF schedule)). Indication: MSSA Bacteremia Last Day of Therapy: 10/05/17 Labs - Once weekly:  CBC/D and BMP, Labs - Every other week:  ESR and CRP 30 Units 0  . cinacalcet (SENSIPAR) 90 MG tablet Take 90 mg by mouth every evening.     Marland Kitchen levothyroxine (SYNTHROID, LEVOTHROID) 300 MCG tablet Take 1 tablet (300 mcg total) by mouth daily. 90 tablet 3  . midodrine (PROAMATINE) 10 MG tablet Take 1 tablet (10 mg total) by mouth 3 (three) times daily. 90 tablet 6  . tadalafil, PAH, (ADCIRCA) 20 MG tablet 1 tab daily (Patient taking differently: Take 20 mg by mouth daily. ) 30 tablet 11  . warfarin (COUMADIN) 6 MG tablet Take 1 tablet (6 mg total) by mouth one time only at 6 PM. (Patient not taking: Reported on 09/23/2017) 30 tablet 0    Allergies as of 09/23/2017 - Review Complete 09/23/2017  Allergen Reaction Noted  . Enalaprilat Swelling 08/13/2015  . Vasotec [enalapril] Swelling 05/23/2014  . Iodinated diagnostic agents Nausea And Vomiting and Nausea Only 2020-03-1015    Family History  Problem Relation Age of Onset  . Heart failure Mother   . Hypertension Mother   . CAD Mother 73  . Emphysema Mother   . Hypertension Father   . Kidney failure Father     Social History   Socioeconomic History  . Marital status: Married    Spouse name: Not on file  . Number of children: 1  . Years of education: Not on file  . Highest education level: Not on file  Occupational History  . Not on file  Social Needs  . Financial resource strain: Not on file  . Food insecurity:    Worry: Not on file    Inability: Not on file  . Transportation  needs:    Medical: Not on file    Non-medical: Not on file  Tobacco Use   . Smoking status: Never Smoker  . Smokeless tobacco: Never Used  Substance and Sexual Activity  . Alcohol use: Yes    Alcohol/week: 0.0 oz    Comment: 08/05/2016 "might have 1 shot/month of liquor"  . Drug use: No  . Sexual activity: Yes  Lifestyle  . Physical activity:    Days per week: Not on file    Minutes per session: Not on file  . Stress: Not on file  Relationships  . Social connections:    Talks on phone: Not on file    Gets together: Not on file    Attends religious service: Not on file    Active member of club or organization: Not on file    Attends meetings of clubs or organizations: Not on file    Relationship status: Not on file  . Intimate partner violence:    Fear of current or ex partner: Not on file    Emotionally abused: Not on file    Physically abused: Not on file    Forced sexual activity: Not on file  Other Topics Concern  . Not on file  Social History Narrative   Retired Archivist.      Review of Systems: Positive for: GI: Described in detail in HPI.    Gen: Denies any fever, chills, rigors, night sweats, anorexia, fatigue, weakness, malaise, involuntary weight loss, and sleep disorder CV: Denies chest pain, angina, palpitations, syncope, orthopnea, PND, peripheral edema, and claudication. Resp: Denies dyspnea, cough, sputum, wheezing, coughing up blood. GU : Denies urinary burning, blood in urine, urinary frequency, urinary hesitancy, nocturnal urination, and urinary incontinence. MS: Denies joint pain or swelling.  Denies muscle weakness, cramps, atrophy.  Derm: Denies rash, itching, oral ulcerations, hives, unhealing ulcers.  Psych: Denies depression, anxiety, memory loss, suicidal ideation, hallucinations,  and confusion. Heme: Denies bruising, bleeding, and enlarged lymph nodes. Neuro:  ?seizure-like activity,Denies any headaches, dizziness, paresthesias. Endo:  Denies any problems with DM, thyroid, adrenal function.  Physical  Exam: Vital signs in last 24 hours: Temp:  [97.6 F (36.4 C)-98 F (36.7 C)] 97.6 F (36.4 C) (05/16 0651) Pulse Rate:  [64-78] 75 (05/16 1130) Resp:  [10-23] 13 (05/16 1130) BP: (88-117)/(61-85) 107/78 (05/16 1130) SpO2:  [94 %-100 %] 98 % (05/16 1130) Weight:  [122.5 kg (270 lb)] 122.5 kg (270 lb) (05/15 2210)    General:   Alert,  Well-developed, well-nourished, pleasant and cooperative in NAD Head:  Normocephalic and atraumatic. Eyes:  Sclera clear, no icterus.   Mild pallor Ears:  Normal auditory acuity. Nose:  No deformity, discharge,  or lesions. Mouth:  No deformity or lesions.  Oropharynx pink & moist. Neck:  Supple; no masses or thyromegaly. Lungs:  Dialysis catheter, Clear throughout to auscultation.   No wheezes, crackles, or rhonchi. No acute distress. Heart: irregular rate, prominent murmur. Extremities:  Without clubbing, mild bilateral pedal edema. Left arm wound VAC Neurologic:  Alert and  oriented x4;  grossly normal neurologically. Skin:  Intact without significant lesions or rashes. Psych:  Alert and cooperative. Normal mood and affect. Abdomen:  Soft, nontender and nondistended. No masses, hepatosplenomegaly or hernias noted. Normal bowel sounds, without guarding, and without rebound.         Lab Results: Recent Labs    09/23/17 2350 09/24/17 0757  WBC 5.3 5.3  HGB 6.6* 7.7*  HCT 21.6* 25.0*  PLT 84* 89*   BMET Recent Labs    09/23/17 2350 09/24/17 0757  NA 136 137  K 3.4* 5.1  CL 94* 96*  CO2 28 26  GLUCOSE 86 77  BUN 12 15  CREATININE 4.63* 4.87*  CALCIUM 8.2* 8.2*   LFT Recent Labs    09/23/17 2350 09/24/17 0757  PROT 7.0  --   ALBUMIN 2.9* 3.2*  AST 18  --   ALT <5*  --   ALKPHOS 107  --   BILITOT 0.5  --    PT/INR Recent Labs    09/23/17 2350  LABPROT 25.9*  INR 2.39    Studies/Results: Dg Chest 2 View  Result Date: 09/24/2017 CLINICAL DATA:  Acute onset of syncope. EXAM: CHEST - 2 VIEW COMPARISON:  Chest  radiograph performed 09/01/2017 FINDINGS: The lungs are well-aerated. Vascular congestion is noted. There is no evidence of focal opacification, pleural effusion or pneumothorax. The heart is mildly enlarged. No acute osseous abnormalities are seen. A left-sided dual-lumen catheter is noted ending about the cavoatrial junction. IMPRESSION: Vascular congestion and mild cardiomegaly; lungs remain grossly clear. Electronically Signed   By: Garald Balding M.D.   On: 09/24/2017 01:02   Ct Head Wo Contrast  Result Date: 09/24/2017 CLINICAL DATA:  57 y/o  M; syncope with seizure-like activity. EXAM: CT HEAD WITHOUT CONTRAST TECHNIQUE: Contiguous axial images were obtained from the base of the skull through the vertex without intravenous contrast. COMPARISON:  04/14/2016 sinus CT. FINDINGS: Brain: No evidence of acute infarction, hemorrhage, hydrocephalus, extra-axial collection or mass lesion/mass effect. Vascular: Extensive calcification of carotid siphons, vertebral arteries, in median vessels of scalp and visible face. Skull: Normal. Negative for fracture or focal lesion. Sinuses/Orbits: Near absence of bony structures of the nasal passages including nasal septum and turbinates. Near complete absence of ethmoid air cells and large defect within the left maxillary sinus medial wall. Apparent bony dehiscence of the right lamina papyracea and of the right-greater-than-left olfactory grooves. Moderate mucosal thickening of the walls of the open nasal cavity and sphenoid sinuses with opacification of the right maxillary sinus. Chronic inflammatory changes of the walls of the sinuses. Findings are similar to prior sinus CT given differences in technique. Normal aeration of mastoid air cells.  Orbits are unremarkable. Other: None. IMPRESSION: 1. No acute intracranial abnormality. 2. 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 Extensive paranasal sinus disease of the residual sinuses. Findings are similar to  prior CT sinus. Electronically Signed   By: Kristine Garbe M.D.   On: 09/24/2017 00:47    Impression: 1.Acute on chronic anemia, dark tarry stools, supratherapeutic INR,FOBT positive Hemoglobin 6.6 on admission status post 1 unit PRBC hemoglobin currently 7.7. patient hemodynamically stable  2.INR 2.39, PTT 25.9  Plan: Clear liquid diet. Nothing by mouth post midnight for EGD in a.m.. Ideally would want INR to be 1.8 or lower if therapy is needed during EGD/if active bleeding noted, will give vitamin K 10 mg IV and f/u Pt/INR in am. PPI twice a day. Monitor H&H and transfuse if hemoglobin less than 7.     LOS: 0 days   Ronnette Juniper, M.D.  09/24/2017, 12:16 PM  Pager [phone] If no answer or after 5 PM call [phone]

## 2017-09-24 NOTE — Consult Note (Addendum)
Cardiology Consultation:   Patient ID: Adam [name].; [id number]; [birth year]   Admit date: 09/23/2017 Date of Consult: 09/24/2017  Primary Care Provider: [name], MD Primary Cardiologist: Dr. Sallyanne Kuster (ICD); Dr. Percival Spanish (general cardiology) Primary Electrophysiologist:  Has an appt to see Dr. Lovena Le 10/21/17   Patient Profile:   Adam [name]. is a 56 y.o. male with a PMH of non-ischemic cardiomyopathy, pulmonary hypertension, atrial fibrillation, PAD, ESRD on HD who is being seen today for the evaluation of pre-syncope at the request of Dr. Mingo Amber.  History of Present Illness:   Mr. Schill was recently admitted to the hospital from 08/17/17-08/28/17 for MSSA bacteremia thought to be 2/2 ICD vs. AV fistula (both removed during hospitalization). He has been on cefazolin since that time. He presented to the ED 09/09/17 with complaints of epistaxis and was found to have a supratherapeutic INR to 10.8 while on coumadin for atrial fibrillation and his coumadin was held. Since that time, he reported having tarry stools, however had not sought medical attention for this issue. On the evening of 09/23/17, he reported having a coughing fit while in bed (he attributes this to post-nasal gtt), when he sat up to get a drink of water from his nightstand, he became unresponsive. His wife was present at the time of this episode and reports that his eyes were open but he was not responding to her voice. She also noted that he was shaking/jerking during this time. The entire episode lasted for ~1 minute, after which the patient reported feeling completely back to normal. He denied any chest pain, palpitations, dizziness, lightheadedness, or syncope surrounding the event. He has never had a syncopal episode in the past.   He was last seen by Dr. Sallyanne Kuster 09/21/17 and reportedly was doing well from a cardiac standpoint. Since removal of his ICD, he had no complaints of chest pain, SOB, orthopnea,  PND, syncope, palpitations, LE edema, unexplained weight gain, or claudication. There was a long discussion regarding reason why ICD was initially placed and need for replacement. Patient was referred to Dr. Lovena Le for further evaluation to determine need for ICD. No medication changes occurred at this visit. Coumadin being managed by the coumadin clinic pharmacists and was on hold for supratherapeutic INRs.   At the time of this evaluation, he reports feeling at his baseline. He has been compliant with dialysis and medications. He has some baseline DOE which he attributes to his pulmHTN. He denies recent fever, chills, sweats, SOB at rest, CP at rest or with exertion, abdominal pain, nausea, vomiting, orthopnea, dizziness, lightheadedness, pre-syncope, or syncope.   Hospital course: VSS. Labs notable for electrolytes wnl, Cr 4.87, Hgb 6.6>7.7 after 1 uPRBC, PLT 89, Trop 0.04 x2, INR 2.39. CT Head negative for acute findings. CXR with mild vascular congestion. EKG with sinus rhythm with 1st degree AVB, low voltage. GI consulted recommending EGD tomorrow if INR <1.8. He was started on a PPI, and home medications continued. Cardiology asked to evaluate for pre-syncope.    Past Medical History:  Diagnosis Date  . AICD (automatic cardioverter/defibrillator) present    [employer]   . Aortic stenosis    moderate AS by 08/2016 echo  . Arthritis    "knees" (08/05/2016)  . Atrial fibrillation ([place])   . ESRD (end stage renal disease) on dialysis ([place])    "M, T, W, T, F; NX stage hemodialysis; I do it at home" (08/05/2016)  . Great toe amputation status ([place])    status  post left hallux amputation 08/01/14  . History of blood transfusion 2009   "S/P biopsy for prostate cancer check"  . Hypertension   . Hypothyroidism (acquired)   . Mitral stenosis    moderate mitral stenosis  . Nonischemic cardiomyopathy ([place])   . Pneumonia 2014; 08/05/2016  . PONV (postoperative nausea and vomiting)   . Pulmonary HTN  ([place])   . Renal insufficiency   . Thyroid cancer ([place]) 2011    Past Surgical History:  Procedure Laterality Date  . ARTERIAL LINE INSERTION Right 08/24/2017   Procedure: ARTERIAL LINE INSERTION INTO RIGHT FEMORAL ARTERY;  Surgeon: Conrad Rensselaer, MD;  Location: [facility];  Service: Vascular;  Laterality: Right;  . CARDIAC CATHETERIZATION Right 2017  . CARDIAC DEFIBRILLATOR PLACEMENT  2009  . DIALYSIS FISTULA CREATION Left 2003   "upper arm"  . DIALYSIS/PERMA CATHETER REMOVAL Left 09/01/2017   Procedure: DIALYSIS/PERMA CATHETER REMOVAL;  Surgeon: Conrad [place], MD;  Location: [place];  Service: Vascular;  Laterality: Left;  . HERNIA REPAIR    . ICD LEAD REMOVAL Right 08/20/2017   Procedure: ICD EXTRACTION WITH C-ARM;  Surgeon: Evans Lance, MD;  Location[facility] OR;  Service: Cardiovascular;  Laterality: Right;  DR VAN TRIGT TO BACK-UP  . INSERTION OF DIALYSIS CATHETER N/A 11/11/2016   Procedure: INSERTION OF TUNNELED DIALYSIS CATHETER;  Surgeon: Angelia Mould, MD;  Location: [place];  Service: Vascular;  Laterality: N/A;  . INSERTION OF DIALYSIS CATHETER Left 08/24/2017   Procedure: INSERTION OF DIALYSIS CATHETER;  Surgeon: Conrad Canovanas, MD;  Location: [place];  Service: Vascular;  Laterality: Left;  . INSERTION OF DIALYSIS CATHETER Left 09/01/2017   Procedure: INSERTION OF DIALYSIS CATHETER;  Surgeon: Conrad Sedan, MD;  Location: [place];  Service: Vascular;  Laterality: Left;  . KIDNEY TRANSPLANT  2009  . KNEE SURGERY Bilateral 2000s   "drained fluid"  . LAPAROSCOPIC GASTRIC BANDING  2006  . PROSTATE BIOPSY  2009  . REVISON OF ARTERIOVENOUS FISTULA Left 1/32/4401   Procedure: PLICATION OF ARTERIOVENOUS FISTULA ANEURYSM;  Surgeon: Elam Dutch, MD;  Location: [place];  Service: Vascular;  Laterality: Left;  . REVISON OF ARTERIOVENOUS FISTULA Left 11/10/2016   Procedure: REVISON OF ARTERIOVENOUS FISTULA - LEFT UPPER ARM;  Surgeon: Elam Dutch, MD;  Location: [place];  Service: Vascular;   Laterality: Left;  . REVISON OF ARTERIOVENOUS FISTULA Left 08/24/2017   Procedure: REVISON OF ARTERIOVENOUS FISTULA LEFT;  Surgeon: Conrad [place], MD;  Location: [place];  Service: Vascular;  Laterality: Left;  . TEE WITHOUT CARDIOVERSION N/A 08/20/2017   Procedure: TRANSESOPHAGEAL ECHOCARDIOGRAM (TEE);  Surgeon: Evans Lance, MD;  Location: [place];  Service: Cardiovascular;  Laterality: N/A;  . THROMBECTOMY W/ EMBOLECTOMY Left 12/25/2014   Procedure: THROMBECTOMY ARTERIOVENOUS FISTULA;  Surgeon: Elam Dutch, MD;  Location: [place];  Service: Vascular;  Laterality: Left;  . THYROIDECTOMY  2011  . TOE AMPUTATION Bilateral     right 1st and  2nd digits; left 1st and 3rd digits"  . UMBILICAL HERNIA REPAIR  X 2  . VEIN HARVEST Left 08/24/2017   Procedure: VEIN HARVEST;  Surgeon: Conrad Laurel, MD;  Location: [facility] OR;  Service: Vascular;  Laterality: Left;     Home Medications:  Prior to Admission medications   Medication Sig Start Date End Date Taking? Authorizing Provider  ambrisentan (LETAIRIS) 10 MG tablet Take 1 tablet (10 mg total) by mouth daily. 09/11/17  Yes Juanito Doom, MD  amiodarone (PACERONE) 200 MG tablet Take 200 mg by mouth every evening.    Yes [provider]  atorvastatin (LIPITOR) 40 MG tablet Take 1 tablet (40 mg total) by mouth every evening. 06/15/17  Yes Diallo, Abdoulaye, MD  ceFAZolin (ANCEF) IVPB Inject 2 g into the vein every dialysis (GIVE AT END OF EACH HD SESSION (MWF schedule)). Indication: MSSA Bacteremia Last Day of Therapy: 10/05/17 Labs - Once weekly:  CBC/D and BMP, Labs - Every other week:  ESR and CRP 08/28/17 10/05/17 Yes Shah, Pratik D, DO  cinacalcet (SENSIPAR) 90 MG tablet Take 90 mg by mouth every evening.    Yes [provider]  levothyroxine (SYNTHROID, LEVOTHROID) 300 MCG tablet Take 1 tablet (300 mcg total) by mouth daily. 06/09/17  Yes Renato Shin, MD  midodrine (PROAMATINE) 10 MG tablet Take 1 tablet (10 mg total) by mouth 3  (three) times daily. 09/02/16  Yes Minus Breeding, MD  tadalafil, PAH, (ADCIRCA) 20 MG tablet 1 tab daily Patient taking differently: Take 20 mg by mouth daily.  12/05/16  Yes Juanito Doom, MD  warfarin (COUMADIN) 6 MG tablet Take 1 tablet (6 mg total) by mouth one time only at 6 PM. Patient not taking: Reported on 09/23/2017 08/28/17 09/27/17  Heath Lark D, DO    Inpatient Medications: Scheduled Meds: . ambrisentan  10 mg Oral Daily  . amiodarone  200 mg Oral QPM  . atorvastatin  40 mg Oral QPM  . calcium acetate  1,334 mg Oral TID WC  . [START ON 09/25/2017] cinacalcet  30 mg Oral Q supper  . [START ON 09/25/2017] darbepoetin (ARANESP) injection - DIALYSIS  200 mcg Intravenous Q Fri-HD  . levothyroxine  300 mcg Oral QAC breakfast  . pantoprazole  40 mg Oral BID AC  . tadalafil (PAH)  20 mg Oral Daily   Continuous Infusions: . [START ON 09/25/2017]  ceFAZolin (ANCEF) IV    . phytonadione (VITAMIN K) IV 10 mg (09/24/17 1338)   PRN Meds: acetaminophen **OR** acetaminophen  Allergies:    Allergies  Allergen Reactions  . Enalaprilat Swelling    Mouth swelling  . Vasotec [Enalapril] Swelling    MOUTH SWELLING  . Iodinated Diagnostic Agents Nausea And Vomiting and Nausea Only    Social History:   Social History   Socioeconomic History  . Marital status: Married    Spouse name: Not on file  . Number of children: 1  . Years of education: Not on file  . Highest education level: Not on file  Occupational History  . Not on file  Social Needs  . Financial resource strain: Not on file  . Food insecurity:    Worry: Not on file    Inability: Not on file  . Transportation needs:    Medical: Not on file    Non-medical: Not on file  Tobacco Use  . Smoking status: Never Smoker  . Smokeless tobacco: Never Used  Substance and Sexual Activity  . Alcohol use: Yes    Alcohol/week: 0.0 oz    Comment: 08/05/2016 "might have 1 shot/month of liquor"  . Drug use: No  . Sexual  activity: Yes  Lifestyle  . Physical activity:    Days per week: Not on file    Minutes per session: Not on file  . Stress: Not on file  Relationships  . Social connections:    Talks on phone: Not on file    Gets together: Not on file    Attends religious  service: Not on file    Active member of club or organization: Not on file    Attends meetings of clubs or organizations: Not on file    Relationship status: Not on file  . Intimate partner violence:    Fear of current or ex partner: Not on file    Emotionally abused: Not on file    Physically abused: Not on file    Forced sexual activity: Not on file  Other Topics Concern  . Not on file  Social History Narrative   Retired Archivist.      Family History:    Family History  Problem Relation Age of Onset  . Heart failure Mother   . Hypertension Mother   . CAD Mother 36  . Emphysema Mother   . Hypertension Father   . Kidney failure Father      ROS:  Please see the history of present illness.   All other ROS reviewed and negative.     Physical Exam/Data:   Vitals:   09/24/17 1130 09/24/17 1300 09/24/17 1330 09/24/17 1400  BP: 107/78 107/75 106/79 96/71  Pulse: 75 72 67 74  Resp: _0 Temp:      TempSrc:      SpO2: 98% 100% 100% 98%  Weight:      Height:        Intake/Output Summary (Last 24 hours) at 09/24/2017 1428 Last data filed at 09/24/2017 0346 Gross per 24 hour  Intake 315 ml  Output -  Net 315 ml   Filed Weights   09/23/17 2210  Weight: 270 lb (122.5 kg)   Body mass index is 35.62 kg/m.  General:  Well nourished, well developed, obese gentleman laying in bed in no acute distress HEENT: sclera anicteric  Neck: no JVD Vascular: No carotid bruits; distal pulses 2+ bilaterally Cardiac:  normal S1, S2; RRR; +murmur heard throughout precordium, no gallops or rubs  Lungs:  clear to auscultation bilaterally, no wheezing, rhonchi or rales  Abd: NABS, soft, nontender, no  hepatomegaly Ext: no edema Musculoskeletal:  No deformities, BUE and BLE strength normal and equal Skin: warm and dry  Neuro:  CNs 2-12 intact, no focal abnormalities noted Psych:  Normal affect   EKG:  The EKG was personally reviewed and demonstrates:  sinus rhythm with 1st degree AVB, low voltage.  Telemetry:  Telemetry was personally reviewed and demonstrates:  Sinus rhythm with low voltage, PACs   Relevant CV Studies: Echocardiogram 08/2017: Study Conclusions  - Left ventricle: The cavity size was normal. Wall thickness was   increased in a pattern of mild LVH. Systolic function was normal.   The estimated ejection fraction was in the range of 60% to 65%.   Wall motion was normal; there were no regional wall motion   abnormalities. Doppler parameters are consistent with abnormal   left ventricular relaxation (grade 1 diastolic dysfunction). The   E/e&' ratio is between 8-15, suggesting indeterminate LV filling   pressure. - Ventricular septum: Septal motion showed abnormal function and   dyssynergy. The contour showed diastolic flattening and systolic   flattening. - Aortic valve: Moderate calcified aortic stenosis. Mean gradient   (S): 25 mm Hg. Peak gradient (S): 40 mm Hg. Valve area (VTI):   1.15 cm^2. Valve area (Vmax): 1.11 cm^2. Valve area (Vmean): 1.16   cm^2. - Mitral valve: Calcified annulus. Mildly thickened leaflets . Mild   to moderate stenosis. Mean gradient (D): 8 mm Hg. Valve area  by   continuity equation (using LVOT flow): 1.25 cm^2. - Left atrium: The atrium was mildly dilated. - Right ventricle: The cavity size was moderately dilated. - Right atrium: Severely dilated. - Tricuspid valve: There was moderate regurgitation. - Pulmonary arteries: PA peak pressure: 59 mm Hg (S). - Inferior vena cava: The vessel was dilated. The respirophasic   diameter changes were blunted (< 50%), consistent with elevated   central venous pressure.  Impressions:  -  Compared to a recent echo in 08/2016, the RVSP is slightly lower   at 59 mmHg. There is RV pressure and volume overload. There is   also moderate aortic stenosis with a mean gradient of 25 mmHg,   moderate RVE and severe RAE, the IVC is dilated. No major   valvular vegetations, however, endocarditis cannot be excluded on   the basis of this study.  Laboratory Data:  Chemistry Recent Labs  Lab 09/23/17 2350 09/24/17 0757  NA 136 137  K 3.4* 5.1  CL 94* 96*  CO2 28 26  GLUCOSE 86 77  BUN 12 15  CREATININE 4.63* 4.87*  CALCIUM 8.2* 8.2*  GFRNONAA 13* 12*  GFRAA 15* 14*  ANIONGAP 14 15    Recent Labs  Lab 09/23/17 2350 09/24/17 0757  PROT 7.0  --   ALBUMIN 2.9* 3.2*  AST 18  --   ALT <5*  --   ALKPHOS 107  --   BILITOT 0.5  --    Hematology Recent Labs  Lab 09/23/17 2350 09/24/17 0757  WBC 5.3 5.3  RBC 2.38* 2.77*  HGB 6.6* 7.7*  HCT 21.6* 25.0*  MCV 90.8 90.3  MCH 27.7 27.8  MCHC 30.6 30.8  RDW 18.5* 18.4*  PLT 84* 89*   Cardiac Enzymes Recent Labs  Lab 09/23/17 2350 09/24/17 0757  TROPONINI 0.04* 0.04*   No results for input(s): TROPIPOC in the last 168 hours.  BNPNo results for input(s): BNP, PROBNP in the last 168 hours.  DDimer No results for input(s): DDIMER in the last 168 hours.  Radiology/Studies:  Dg Chest 2 View  Result Date: 09/24/2017 CLINICAL DATA:  Acute onset of syncope. EXAM: CHEST - 2 VIEW COMPARISON:  Chest radiograph performed 09/01/2017 FINDINGS: The lungs are well-aerated. Vascular congestion is noted. There is no evidence of focal opacification, pleural effusion or pneumothorax. The heart is mildly enlarged. No acute osseous abnormalities are seen. A left-sided dual-lumen catheter is noted ending about the cavoatrial junction. IMPRESSION: Vascular congestion and mild cardiomegaly; lungs remain grossly clear. Electronically Signed   By: Garald Balding M.D.   On: 09/24/2017 01:02   Ct Head Wo Contrast  Result Date:  09/24/2017 CLINICAL DATA:  57 y/o  M; syncope with seizure-like activity. EXAM: CT HEAD WITHOUT CONTRAST TECHNIQUE: Contiguous axial images were obtained from the base of the skull through the vertex without intravenous contrast. COMPARISON:  04/14/2016 sinus CT. FINDINGS: Brain: No evidence of acute infarction, hemorrhage, hydrocephalus, extra-axial collection or mass lesion/mass effect. Vascular: Extensive calcification of carotid siphons, vertebral arteries, in median vessels of scalp and visible face. Skull: Normal. Negative for fracture or focal lesion. Sinuses/Orbits: Near absence of bony structures of the nasal passages including nasal septum and turbinates. Near complete absence of ethmoid air cells and large defect within the left maxillary sinus medial wall. Apparent bony dehiscence of the right lamina papyracea and of the right-greater-than-left olfactory grooves. Moderate mucosal thickening of the walls of the open nasal cavity and sphenoid sinuses with opacification of the right maxillary  sinus. Chronic inflammatory changes of the walls of the sinuses. Findings are similar to prior sinus CT given differences in technique. Normal aeration of mastoid air cells.  Orbits are unremarkable. Other: None. IMPRESSION: 1. No acute intracranial abnormality. 2. 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 Extensive paranasal sinus disease of the residual sinuses. Findings are similar to prior CT sinus. Electronically Signed   By: Kristine Garbe M.D.   On: 09/24/2017 00:47    Assessment and Plan:   1. Pre-syncope: patient experienced an episode of unresponsiveness yesterday evening where wife noted he was conscious but not responding, and described associated tremors/rigors which lasted for 1 minute. No pre/post complaints of CP or palpitations. CT Head negative for acute findings. EKG with sinus rhythm with 1st degree AVB, low voltage, non-ischemic. Trop trend flat at 0.04 x2. Possibly  this episode is vasovagal medicated in the setting of coughing. Also likely contributed to by acute anemia in the setting of GI bleed.  - No need for further cardiac work-up at this time.   2. GI bleed: presented with tarry stools. Found to have Hgb 6.6 on admission (baseline ~8). FOBT +. GI consulted recommending EGD 09/25/17 if INR <1.8. Patient was started on pantoprazole.He was given I uPRBC and Hgb increased to 7.7. GI bleed likely occurred in the setting of supratherapeutic INR over the past 2 weeks (max >10 09/09/17, 2.39 on admission). Suspect this has contributed to pre-syncope.   - Continue management per GI and primary teams  3. Non-ischemic cardiomyopathy: patient had ICD removed 08/20/17 given concerns for possible bacteremia source. Per chart review, it is unclear why defibrillator was placed in 2009. Echo'ss dating back to 2012 with normal LV systolic function. Possible there was a brief myocardial dysfunction in 2009. Patient is scheduled to follow-up with Dr. Lovena Le for further evaluation of need for re-implantation of ICD. Patient was instructed to wear lifevest until follow-up with Dr. Lovena Le - Continue to monitor on telemetry  4. Atrial fibrillation: maintaining sinus rhythm  - Continue to hold coumadin in the setting of acute GI bleed and plans for EGD tomorrow   - Continue amiodarone  5. Pulmonary Hypertension: Follows with Dr. Lake Bells outpatient - Continue ambrisentan and tadalafil  6. ESRD on HD: Nephrology following. CXR with vascular congestion. Appears euvolemic on exam.  - Volume management per nephrology  7. MSSA bacteremia: recent admission 08/17/17-08/28/17 for MSSA bacteremia thought to be 2/2 ICD vs. AV fistula (both removed during hospitalization). He is on cefazolin, course to complete 09/28/17.  - Continue antibiotics per primary team  8. Aortic stenosis: noted to be moderate with peak gradient 34mHg on last echo 08/2017. Doubtful this has contributed to pre-syncope  at this time.  - Continue routine monitoring  9. HTN: BP well controlled; not currently on medications. Now with orthostatic hypotension on midodrine - Continue midodrine  10. HLD: no recent lipid profile - Continue statin     For questions or updates, please contact CIsabellaPlease consult [email or web address] for contact info under Cardiology/STEMI.   Signed, KAbigail Butts PA-C  09/24/2017 2:28 PM [phone] Personally seen and examined. Agree with above.  57year old male with end-stage renal disease status post failed renal transplant, status post removal of ICD in the setting of bacteremia MSSA who had an episode at home during a coughing fit of sitting up on the edge of his bed and staring, shaking with his wife yelling at him however he did not respond initially and finally after  she grabbed him came to.  He did not fall over.  He does not quite remember the whole situation.  Currently he is back to his baseline.  No chest pain, no significant shortness of breath.  He has been having melena.  Hemoglobin on arrival was 6.  Has been on Coumadin for paroxysmal atrial fibrillation.  GEN: Well nourished, well developed, in no acute distress, obese HEENT: normal  Neck: no JVD, carotid bruits, or masses Cardiac: RRR; occasional ectopy 2/6systolic murmur heard both right and left sternal borders, rubs, or gallops,no edema  Respiratory:  clear to auscultation bilaterally, normal work of breathing GI: soft, nontender, nondistended, + BS MS: no deformity or atrophy  Skin: warm and dry, no rash Neuro:  Alert and Oriented x 3, Strength and sensation are intact Psych: euthymic mood, full affect  EKG and telemetry both show sinus rhythm first-degree AV block  Echocardiogram last month shows EF 65%, moderate aortic valve stenosis.  Assessment and plan:  Altered state of consciousness  -Unclear exactly what happened but this was surrounding a coughing fit and his blood pressures  have been slightly low at times.  Perhaps he had a vagal type reaction where he had transient dip in his blood pressure that resulted in decreased cerebral perfusion that did not result in full syncope but caused cerebral stunning resulting in his abnormal motor actions.  Continue to monitor his blood pressure, resuscitate with packed red blood cells as necessary, encourage hemoglobin greater than 8.  Mildly elevated troponin of 0.4 - This is in the setting of severe anemia.  No chest pain.  Mild flat trend of minimal demand ischemia.  No further cardiac work-up.  Moderate aortic stenosis -Should not be of any clinical significance at this time.  Continue to monitor with serial echocardiograms  History of ICD -Removed in the setting of bacteremia.  EF is now normal.  He states that this was placed because of atrial fibrillation in the past.  Perhaps it was placed because of tachycardia induced cardiomyopathy in the setting of atrial fibrillation, this is all conjecture.  I do not have access to his records from [place].  Nonetheless, I think would be high risk to reimplant an ICD unless he absolutely needs this device.  So far he has demonstrated normal sinus rhythm.  His EF is normal.  He does not describe any prior evidence of sudden cardiac death.  Dr. Sallyanne Kuster has set up a referral to Dr. Lovena Le to gain his advice on whether or not this should be reimplanted.  Pulmonary hypertension -Followed by Dr. Lake Bells.  Medications reviewed as above.  Pulmonary pressures peak were 59 mmHg on last echocardiogram.  Right atrium is severely dilated.  Once again, he may be more susceptible to decreases in preload from anemia, blood loss and/or Valsalva from coughing.  Paroxysmal atrial fibrillation -Continue to hold Coumadin in the setting of acute GI bleed.  Hemoglobin 6.  EGD tomorrow.  Currently on amiodarone.   End-stage renal disease on hemodialysis - Failed renal transplant.  MSSA bacteremia -  Currently on cefazolin to complete on 09/28/2017.  Both his ICD and AV fistula were removed during April hospitalization.  Please let us know we can be of further assistance.  No further cardiac recommendations at this time.  Candee Furbish, MD

## 2017-09-24 NOTE — ED Notes (Signed)
Date and time results received: 09/24/17 12:33 AM (use smartphrase ".now" to insert current time)  Test: Hemoglobin Critical Value: 6.6  Name of Provider Notified: Charlann Lange, RN   Orders Received? Or Actions Taken?: No new orders

## 2017-09-24 NOTE — Evaluation (Signed)
Physical Therapy Evaluation Patient Details Name: Adam [name]. MRN: [id number] DOB: 17-Sep-1960 Today's Date: 09/24/2017   History of Present Illness  Pt is a 57 y/o male admitted secondary to near syncope. Also found to have low hemoglobin. Pt recently admitted dor L AV fistula removal. PMH includes ESRD on HD MWF, HTN, nonischemic cardiomyopathy, and s/p ICD removal secondary to infection.  Clinical Impression  Pt admitted secondary to problem above with deficits below. Pt requiring min guard to supervision for mobility this session. Mild unsteadiness noted, however, no overt LOB noted. Does report increased weakness from last hospital stay. Will continue to follow acutely to maximize functional mobility independence and safety.     Follow Up Recommendations Outpatient PT    Equipment Recommendations  Other (comment)(tub bench )    Recommendations for Other Services       Precautions / Restrictions Precautions Precautions: Other (comment) Precaution Comments: LUE wound vac Restrictions Weight Bearing Restrictions: No      Mobility  Bed Mobility               General bed mobility comments: Sitting at EOB upon entry.   Transfers Overall transfer level: Needs assistance Equipment used: None Transfers: Sit to/from Stand Sit to Stand: Supervision         General transfer comment: Supervision for safety.   Ambulation/Gait Ambulation/Gait assistance: Min guard Ambulation Distance (Feet): 25 Feet Assistive device: None Gait Pattern/deviations: Step-through pattern;Decreased stride length Gait velocity: Decreased    General Gait Details: Slow, cautious gait. Mild unsteadiness, however, no overt LOB noted. Distance limited to room as pt in ED and connected to monitors.   Stairs            Wheelchair Mobility    Modified Rankin (Stroke Patients Only)       Balance Overall balance assessment: Needs assistance Sitting-balance support: No upper  extremity supported;Feet supported Sitting balance-Leahy Scale: Good     Standing balance support: No upper extremity supported;During functional activity Standing balance-Leahy Scale: Fair                               Pertinent Vitals/Pain Pain Assessment: No/denies pain    Home Living Family/patient expects to be discharged to:: Private residence Living Arrangements: Spouse/significant other Available Help at Discharge: Family;Available PRN/intermittently Type of Home: House Home Access: Stairs to enter Entrance Stairs-Rails: None Entrance Stairs-Number of Steps: 1(threshold) Home Layout: Two level Home Equipment: None Additional Comments: Pt was sponge bathing secondary to wound vac.     Prior Function Level of Independence: Independent         Comments: was driving      Hand Dominance   Dominant Hand: Right    Extremity/Trunk Assessment   Upper Extremity Assessment Upper Extremity Assessment: Overall WFL for tasks assessed    Lower Extremity Assessment Lower Extremity Assessment: Generalized weakness    Cervical / Trunk Assessment Cervical / Trunk Assessment: Normal  Communication   Communication: No difficulties  Cognition Arousal/Alertness: Awake/alert Behavior During Therapy: WFL for tasks assessed/performed Overall Cognitive Status: Within Functional Limits for tasks assessed                                        General Comments General comments (skin integrity, edema, etc.): Pt's wife present during session. Checked BP in sitting and standing and  no orthostatics noted; see vitals flowsheet.     Exercises     Assessment/Plan    PT Assessment Patient needs continued PT services  PT Problem List Decreased strength;Decreased balance;Decreased mobility;Decreased knowledge of use of DME       PT Treatment Interventions DME instruction;Gait training;Stair training;Functional mobility training;Therapeutic  activities;Therapeutic exercise;Balance training;Patient/family education    PT Goals (Current goals can be found in the Care Plan section)  Acute Rehab PT Goals Patient Stated Goal: to go home  PT Goal Formulation: With patient Time For Goal Achievement: 10/08/17 Potential to Achieve Goals: Good    Frequency Min 3X/week   Barriers to discharge        Co-evaluation               AM-PAC PT "6 Clicks" Daily Activity  Outcome Measure Difficulty turning over in bed (including adjusting bedclothes, sheets and blankets)?: None Difficulty moving from lying on back to sitting on the side of the bed? : A Little Difficulty sitting down on and standing up from a chair with arms (e.g., wheelchair, bedside commode, etc,.)?: A Little Help needed moving to and from a bed to chair (including a wheelchair)?: A Little Help needed walking in hospital room?: A Little Help needed climbing 3-5 steps with a railing? : A Little 6 Click Score: 19    End of Session   Activity Tolerance: Patient tolerated treatment well Patient left: in bed;with call bell/phone within reach;with family/visitor present Nurse Communication: Mobility status PT Visit Diagnosis: Muscle weakness (generalized) (M62.81);Unsteadiness on feet (R26.81)    Time: 7412-8786 PT Time Calculation (min) (ACUTE ONLY): 23 min   Charges:   PT Evaluation $PT Eval Low Complexity: 1 Low PT Treatments $Therapeutic Activity: 8-22 mins   PT G Codes:        Adam [name], PT, DPT  Acute Rehabilitation Services  Pager: [phone]   Adam [name] 09/24/2017, 1:50 PM

## 2017-09-24 NOTE — ED Notes (Signed)
A11 sent to materials for wound change supplies, portable equipment paged.

## 2017-09-24 NOTE — ED Notes (Signed)
Care handoff to [place]

## 2017-09-24 NOTE — H&P (Addendum)
[facility] Admission History and Physical Service Pager: [phone]  Patient name: Adam [name]. Medical record number: [id number] Date of birth: November 25, 1960 Age: 57 y.o. Gender: Adam  Primary Care Provider: [name], MD Consultants: nephro/GI Code Status: full  Chief Complaint: near syncope  Assessment and Plan: Adam [name]. is a 57 y.o. Adam presenting with near syncope . PMH is significant for recent afib, ESRD on HD, pulmonary HTN, non ischemic cardiomyopathy, hypothyroidism, HLD, AV fistula removal, recent tarry diarrhea.  Near Syncope, acute Patient presented to the ED after being non responsive but conscious for a brief period of time at home this evening. During that time patient had tremors/rigors described as seizure like activity. Episode was brief. Patient was at baseline prior to episode but was coughing prior  To episode. Symptomatic anemia vs arrythmia vs less likely seizure.  Patient with hx of GI bleeding and anemia now to 6.6 from recent 8.0.  Patient also has hx of Afib with recent ICD removal in setting of sepsis, now on amiodarone and coumadin.  No sensation of palpitation and cardiology was considering stopping amiodarone with no recent Afib on ecg.  Patient has no hx of seizure and no seizure meds on file but wife reports some clenching/non-responsive features during the event thought it looked like a seizure.  She also describes him having some confusion when he started responding for a few minutes.  No chest pain, neg ECG and trop of 0.04 make ACS unlikely.  CT neg for head bleed, no recent trauma make hemmorhagic stroke unlikely.  no SOB/tachycardia make PE less likely. Given patient was cough prior to episode and is on amiodarone, with prolonged QTc and history of low blood pressure, this event could consistent with situational syncope neurally and cardiovascularly mediated. Patient is also anemic which could have  contributed to episode given history of non ischemic cardiomyopathy -admit to tele, attending Dr. Mingo Amber -continuous cardiac monitoring -consider cards consult in AM -continue home amiodarone -orthostatic vitals -additional troponin _0  -manage anemia as below -consider neuro in AM but seizure is not top of differential --am EKG  ESRD on HD (MWF) via permcath:  Last HD 5/15.  Hx of having AV fistula removed recently in setting of sepsis, now with permcath.   K 3.4 and patient in line with chronic hypotension -call nephro in AM to notify for HD (non-emergent) -hold all calcium/phosphate binders, nephro may order -cont cefazolin with HD through 5/27  Symptomatic Anemia, subacute  Hgb 6.6, recent hx of tarry diarrhea since started abx for recent sepsis, C.Diff neg. Patient recently was supratherapeutic with his INR and only very recently has returned within normal range. Suspect GI bleed. Scheduled to have colonoscopy. No history of hematemesis. -FOBT -transfuse 1u RBC -f/u cbc -GI consult in AM -starting pantoprazole 75m  Atrial fibrillation, chronic Patient was recently seen by cardiology after he had ICD removed in the setting of an infection. Atriasl fibrillation though to be secondary to right disease in the setting of severe pulmonary HTN. No recent episode [place] Currently being reevaluated for need for defibrillator. --Continue warfarin --recommend reduced dose of amiodarone per cardiology  PAH, chronic, severe PA pressure of 59 per last echo in 4/19. Patient hypotensive on admission 88/61 after earlier dialysis session. Given episode of near syncope and hypotension, opted to change PAH med regimen -hold home tadalafil (amiodarone can increase plasma concentration) -continue home ambresartan  Bilateral nodule on groin concern for aneurysm  as he did have femcath access on one side, no erythema/drainage.  He says they are improving in size over the last  few days since he noticed them -consider [place] in AM if clinically justified  Hypothyroidism S/p thyroid cancer on levothyroxine --Continue home dose   HLD Continue atorvastatin  FEN/GI: renal diet w/ fluid restriction, pantoprazole Prophylaxis: SCDs (holding coumadin in light of anemia and likely bleed)  Disposition: to tele  History of Present Illness:  Kamuela Magos. is a 57 y.o. Adam presenting with epsiode of near syncope.  He was laying in bed with his wife when he went to sit up and while sitting on side of bed his wife said he started clenching and stopped responding to her.  His eyes were open but he was not speaking.  She says he fell/laid back on the bed and just laid there for a minute before starting to respond to her and at that time he seemed a little confused.  She says his eyes were open the whole time.  Recent medical hx involved sepsis which resulted in removal of ICD and AV fistula, now on amiodarone for afib and with permcath for HD access.  He is getting cafazolin during HD through 5/27.  In what is believed to be a response to abx, he has started getting tarry diarrhea (Cdiff neg) and has now worsening anemia.   It is believed that the amiodarone contributed to recent signifcant increase of his INR (on coumadin for Afib) to >8 but it has since been lowered to 2.39 on this admission.  He is known to be compliant with home meds  Review Of Systems: Per HPI with the following additions:   Review of Systems  HENT: Positive for congestion. Negative for hearing loss and sore throat.   Eyes: Negative for pain.  Respiratory: Positive for cough. Negative for hemoptysis, shortness of breath and wheezing.   Cardiovascular: Negative for chest pain, palpitations and leg swelling.  Gastrointestinal: Positive for diarrhea. Negative for abdominal pain, constipation and vomiting.  Musculoskeletal: Negative for falls.  Skin: Negative for itching and rash.  Neurological:  Negative for speech change and focal weakness.    Patient Active Problem List   Diagnosis Date Noted  . Near syncope 09/24/2017  . Anemia 09/16/2017  . Diarrhea 09/16/2017  . Tarry stools 09/16/2017  . Acute encephalopathy   . Hypotension   . Complication of arteriovenous dialysis fistula   . Stage 5 chronic kidney disease on chronic dialysis ([place])   . Infected defibrillator ([place])   . MSSA bacteremia 08/18/2017  . Sepsis ([place]) 08/17/2017  . Lymphadenopathy, mediastinal 04/10/2017  . Hypothyroidism 10/09/2016  . Acute respiratory failure with hypoxia ([place]) 08/06/2016  . HCAP (healthcare-associated pneumonia) 08/05/2016  . Thyroid cancer ([place]) 06/12/2016  . Epistaxis 03/04/2016  . Long term current use of amiodarone 01/10/2016  . Critical lower limb ischemia 12/04/2015  . Carotid artery stenosis 05/24/2015  . Excess skin 05/24/2015  . Osteomyelitis of left foot ([place]) 04/30/2015  . Status post amputation 04/19/2015  . Wound dehiscence 04/19/2015  . Sinus congestion 03/08/2015  . Bleeding pseudoaneurysm of left brachiocephalic arteriovenous fistula ([place]) 12/24/2014  . Pure hypercholesterolemia 11/09/2014  . History of thyroid cancer 11/07/2014  . Obesity 11/07/2014  . Healthcare maintenance 11/07/2014  . H/O malignant neoplasm of thyroid 11/07/2014  . Toe amputation status ([place]) 10/13/2014  . ICD (implantable cardioverter-defibrillator) in place 07/25/2014  . ESRD (end stage renal disease) on hemodialysis 07/25/2014  . End-stage  renal disease ([place]) 07/25/2014  . Automatic implantable cardioverter-defibrillator in situ 07/25/2014  . Pulmonary hypertension ([place]) 2020-12-2814  . Secondary pulmonary hypertension 2020-12-2814  . Atrial fibrillation [I48.91] 05/24/2014  . Long term current use of anticoagulant therapy 05/24/2014    Past Medical History: Past Medical History:  Diagnosis Date  . AICD (automatic cardioverter/defibrillator) present    Medtronic   . Aortic stenosis     moderate AS by 08/2016 echo  . Arthritis    "knees" (08/05/2016)  . Atrial fibrillation ([place])   . ESRD (end stage renal disease) on dialysis ([place])    "M, T, W, T, F; NX stage hemodialysis; I do it at home" (08/05/2016)  . Great toe amputation status ([place])    status post left hallux amputation 08/01/14  . History of blood transfusion 2009   "S/P biopsy for prostate cancer check"  . Hypertension   . Hypothyroidism (acquired)   . Mitral stenosis    moderate mitral stenosis  . Nonischemic cardiomyopathy ([place])   . Pneumonia 2014; 08/05/2016  . PONV (postoperative nausea and vomiting)   . Pulmonary HTN ([place])   . Renal insufficiency   . Thyroid cancer ([place]) 2011    Past Surgical History: Past Surgical History:  Procedure Laterality Date  . ARTERIAL LINE INSERTION Right 08/24/2017   Procedure: ARTERIAL LINE INSERTION INTO RIGHT FEMORAL ARTERY;  Surgeon: Conrad Tillson, MD;  Location: [place];  Service: Vascular;  Laterality: Right;  . CARDIAC CATHETERIZATION Right 2017  . CARDIAC DEFIBRILLATOR PLACEMENT  2009  . DIALYSIS FISTULA CREATION Left 2003   "upper arm"  . DIALYSIS/PERMA CATHETER REMOVAL Left 09/01/2017   Procedure: DIALYSIS/PERMA CATHETER REMOVAL;  Surgeon: Conrad [place], MD;  Location: [place];  Service: Vascular;  Laterality: Left;  . HERNIA REPAIR    . ICD LEAD REMOVAL Right 08/20/2017   Procedure: ICD EXTRACTION WITH C-ARM;  Surgeon: Evans Lance, MD;  Location: [facility] OR;  Service: Cardiovascular;  Laterality: Right;  DR VAN TRIGT TO BACK-UP  . INSERTION OF DIALYSIS CATHETER N/A 11/11/2016   Procedure: INSERTION OF TUNNELED DIALYSIS CATHETER;  Surgeon: Angelia Mould, MD;  Location: [place];  Service: Vascular;  Laterality: N/A;  . INSERTION OF DIALYSIS CATHETER Left 08/24/2017   Procedure: INSERTION OF DIALYSIS CATHETER;  Surgeon: Conrad Eunice, MD;  Location: [place];  Service: Vascular;  Laterality: Left;  . INSERTION OF DIALYSIS CATHETER Left 09/01/2017   Procedure: INSERTION  OF DIALYSIS CATHETER;  Surgeon: Conrad Lisle, MD;  Location: [place];  Service: Vascular;  Laterality: Left;  . KIDNEY TRANSPLANT  2009  . KNEE SURGERY Bilateral 2000s   "drained fluid"  . LAPAROSCOPIC GASTRIC BANDING  2006  . PROSTATE BIOPSY  2009  . REVISON OF ARTERIOVENOUS FISTULA Left 09/11/8880   Procedure: PLICATION OF ARTERIOVENOUS FISTULA ANEURYSM;  Surgeon: Elam Dutch, MD;  Location: [place];  Service: Vascular;  Laterality: Left;  . REVISON OF ARTERIOVENOUS FISTULA Left 11/10/2016   Procedure: REVISON OF ARTERIOVENOUS FISTULA - LEFT UPPER ARM;  Surgeon: Elam Dutch, MD;  Location: [place];  Service: Vascular;  Laterality: Left;  . REVISON OF ARTERIOVENOUS FISTULA Left 08/24/2017   Procedure: REVISON OF ARTERIOVENOUS FISTULA LEFT;  Surgeon: Conrad [place], MD;  Location: [place];  Service: Vascular;  Laterality: Left;  . TEE WITHOUT CARDIOVERSION N/A 08/20/2017   Procedure: TRANSESOPHAGEAL ECHOCARDIOGRAM (TEE);  Surgeon: Evans Lance, MD;  Location: [facility] OR;  Service: Cardiovascular;  Laterality: N/A;  . THROMBECTOMY W/  EMBOLECTOMY Left 12/25/2014   Procedure: THROMBECTOMY ARTERIOVENOUS FISTULA;  Surgeon: Elam Dutch, MD;  Location: [place];  Service: Vascular;  Laterality: Left;  . THYROIDECTOMY  2011  . TOE AMPUTATION Bilateral     right 1st and  2nd digits; left 1st and 3rd digits"  . UMBILICAL HERNIA REPAIR  X 2  . VEIN HARVEST Left 08/24/2017   Procedure: VEIN HARVEST;  Surgeon: Conrad Farmers, MD;  Location: [facility] OR;  Service: Vascular;  Laterality: Left;    Social History: Social History   Tobacco Use  . Smoking status: Never Smoker  . Smokeless tobacco: Never Used  Substance Use Topics  . Alcohol use: Yes    Alcohol/week: 0.0 oz    Comment: 08/05/2016 "might have 1 shot/month of liquor"  . Drug use: No   Additional social history:   Please also refer to relevant sections of EMR.  Family History: Family History  Problem Relation Age of Onset  . Heart failure  Mother   . Hypertension Mother   . CAD Mother 33  . Emphysema Mother   . Hypertension Father   . Kidney failure Father      Allergies and Medications: Allergies  Allergen Reactions  . Enalaprilat Swelling    Mouth swelling  . Vasotec [Enalapril] Swelling    MOUTH SWELLING  . Iodinated Diagnostic Agents Nausea And Vomiting and Nausea Only   No current facility-administered medications on file prior to encounter.    Current Outpatient Medications on File Prior to Encounter  Medication Sig Dispense Refill  . ambrisentan (LETAIRIS) 10 MG tablet Take 1 tablet (10 mg total) by mouth daily. 30 tablet 11  . amiodarone (PACERONE) 200 MG tablet Take 200 mg by mouth every evening.     Marland Kitchen atorvastatin (LIPITOR) 40 MG tablet Take 1 tablet (40 mg total) by mouth every evening. 30 tablet 2  . ceFAZolin (ANCEF) IVPB Inject 2 g into the vein every dialysis (GIVE AT END OF EACH HD SESSION (MWF schedule)). Indication: MSSA Bacteremia Last Day of Therapy: 10/05/17 Labs - Once weekly:  CBC/D and BMP, Labs - Every other week:  ESR and CRP 30 Units 0  . cinacalcet (SENSIPAR) 90 MG tablet Take 90 mg by mouth every evening.     Marland Kitchen levothyroxine (SYNTHROID, LEVOTHROID) 300 MCG tablet Take 1 tablet (300 mcg total) by mouth daily. 90 tablet 3  . midodrine (PROAMATINE) 10 MG tablet Take 1 tablet (10 mg total) by mouth 3 (three) times daily. 90 tablet 6  . tadalafil, PAH, (ADCIRCA) 20 MG tablet 1 tab daily (Patient taking differently: Take 20 mg by mouth daily. ) 30 tablet 11  . warfarin (COUMADIN) 6 MG tablet Take 1 tablet (6 mg total) by mouth one time only at 6 PM. (Patient not taking: Reported on 09/23/2017) 30 tablet 0    Objective: BP 94/66   Pulse 72   Temp 98 F (36.7 C) (Oral)   Resp 14   Ht _0  (1.854 m)   Wt 270 lb (122.5 kg)   SpO2 100%   BMI 35.62 kg/m  Exam: General: comfortable, NAD Eyes: EOMI, PERRLA, arcus senilus ENTM: MMM, some rhinorhea Cardiovascular: RRR, 3/6 murmur   Respiratory: CTAB, no IWB, no wheezes/crackles Gastrointestinal: soft belly, no TTP MSK: c/o 2 bilateral lesions of groin along femoral crease, firm ~3inches Derm: no sking lesions to exposed skin Neuro: CN exam with no noted deficits Psych: pleasant, alert/appropriate  Labs and Imaging: CBC BMET  Recent Labs  Lab 09/23/17 2350  WBC 5.3  HGB 6.6*  HCT 21.6*  PLT 84*   Recent Labs  Lab 09/23/17 2350  NA 136  K 3.4*  CL 94*  CO2 28  BUN 12  CREATININE 4.63*  GLUCOSE 86  CALCIUM 8.2*     Dg Chest 2 View  Result Date: 09/24/2017 CLINICAL DATA:  Acute onset of syncope. EXAM: CHEST - 2 VIEW COMPARISON:  Chest radiograph performed 09/01/2017 FINDINGS: The lungs are well-aerated. Vascular congestion is noted. There is no evidence of focal opacification, pleural effusion or pneumothorax. The heart is mildly enlarged. No acute osseous abnormalities are seen. A left-sided dual-lumen catheter is noted ending about the cavoatrial junction. IMPRESSION: Vascular congestion and mild cardiomegaly; lungs remain grossly clear. Electronically Signed   By: Garald Balding M.D.   On: 09/24/2017 01:02   Ct Head Wo Contrast  Result Date: 09/24/2017 CLINICAL DATA:  57 y/o  M; syncope with seizure-like activity. EXAM: CT HEAD WITHOUT CONTRAST TECHNIQUE: Contiguous axial images were obtained from the base of the skull through the vertex without intravenous contrast. COMPARISON:  04/14/2016 sinus CT. FINDINGS: Brain: No evidence of acute infarction, hemorrhage, hydrocephalus, extra-axial collection or mass lesion/mass effect. Vascular: Extensive calcification of carotid siphons, vertebral arteries, in median vessels of scalp and visible face. Skull: Normal. Negative for fracture or focal lesion. Sinuses/Orbits: Near absence of bony structures of the nasal passages including nasal septum and turbinates. Near complete absence of ethmoid air cells and large defect within the left maxillary sinus medial wall.  Apparent bony dehiscence of the right lamina papyracea and of the right-greater-than-left olfactory grooves. Moderate mucosal thickening of the walls of the open nasal cavity and sphenoid sinuses with opacification of the right maxillary sinus. Chronic inflammatory changes of the walls of the sinuses. Findings are similar to prior sinus CT given differences in technique. Normal aeration of mastoid air cells.  Orbits are unremarkable. Other: None. IMPRESSION: 1. No acute intracranial abnormality. 2. 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 Extensive paranasal sinus disease of the residual sinuses. Findings are similar to prior CT sinus. Electronically Signed   By: Kristine Garbe M.D.   On: 09/24/2017 00:47    I have seen and evaluated the patient with Dr. Criss Rosales. I am in agreement with the note above in its revised form. My additions are in blue.  [name], MD Family Medicine, PGY-2  Sherene Sires, DO 09/24/2017, 2:36 AM PGY-1, [place] Intern pager: [phone], text pages welcome

## 2017-09-24 NOTE — Progress Notes (Signed)
VASCULAR LAB PRELIMINARY  PRELIMINARY  PRELIMINARY  PRELIMINARY  Ultrasound of the bilateral groins completed.    Preliminary report:   There is a partially thrombosed pseudoaneurysm in the right groin measuring 2cmX3.13cm with neck width measuring 0.581cm.  There is a hematoma noted in the left groin.  Attempted to call Drs Criss Rosales and Mingo Amber. Pagers listed are not in service. Attempted to call clinic, no one would answer    Gonzalo Waymire, RVT  09/24/2017, 2:53 PM

## 2017-09-24 NOTE — ED Notes (Signed)
  Lunch ordered for PT 

## 2017-09-24 NOTE — Consult Note (Addendum)
[place] Nurse wound consult note Reason for Consult: VAC placement to left antecubital fossa Wound type: Healing surgical site Measurement: 4 cm x 2 cm x 0 cm.   Wound bed: 100% clean, pink, granulation tissue Drainage (amount, consistency, odor) No drainage, no odor Periwound: Intact, normal color and texture.  Staples remain above and below the wound site.  These should be able to be removed, according to MD preference. Dressing procedure/placement/frequency: I placed a VAC dressing today.  Obtained a "no leak" seal.  Patient states Dr. Geryl Councilman is the physician that performed his surgical procedure and originally ordered the [facility] therapy.  I think this wound, that only needs to re-epithelialize, could be managed with moist wound healing with a simple vaseline gauze changed daily.  For now, I have continued the [facility] therapy and recommend we get Dr. Geryl Councilman to re-eval the area and treatment, if possible, while the patient is being cared for inpatient.  I placed pieces of Mepitel dressing over the staple sites to protect and ease drape removal.  The patient tolerated the procedure without any difficulty. Monitor the wound area(s) for worsening of condition such as: Signs/symptoms of infection,  Increase in size,  Development of or worsening of odor, Development of pain, or increased pain at the affected locations.  Notify the medical team if any of these develop.  Thank you for the consult.  Discussed plan of care with the patient and bedside nurse.  [place] nurse will not follow at this time.  Please re-consult the [place] team if needed.  Val Riles, RN, MSN, CWOCN, CNS-BC, pager [phone]

## 2017-09-25 ENCOUNTER — Encounter (HOSPITAL_COMMUNITY)
Admission: EM | Disposition: A | Discharge status: Home / Self Care | Payer: Self-pay | Source: Home / Self Care | Attending: Family Medicine

## 2017-09-25 ENCOUNTER — Observation Stay (HOSPITAL_COMMUNITY): Payer: Medicare HMO | Admitting: Anesthesiology

## 2017-09-25 ENCOUNTER — Ambulatory Visit: Payer: Medicare HMO | Admitting: Cardiology

## 2017-09-25 ENCOUNTER — Encounter (HOSPITAL_COMMUNITY): Payer: Self-pay | Admitting: *Deleted

## 2017-09-25 DIAGNOSIS — N186 End stage renal disease: Secondary | ICD-10-CM | POA: Diagnosis not present

## 2017-09-25 DIAGNOSIS — D631 Anemia in chronic kidney disease: Secondary | ICD-10-CM | POA: Diagnosis not present

## 2017-09-25 DIAGNOSIS — R55 Syncope and collapse: Secondary | ICD-10-CM | POA: Diagnosis not present

## 2017-09-25 DIAGNOSIS — K2210 Ulcer of esophagus without bleeding: Secondary | ICD-10-CM | POA: Diagnosis not present

## 2017-09-25 DIAGNOSIS — I272 Pulmonary hypertension, unspecified: Secondary | ICD-10-CM | POA: Diagnosis not present

## 2017-09-25 DIAGNOSIS — Z992 Dependence on renal dialysis: Secondary | ICD-10-CM | POA: Diagnosis not present

## 2017-09-25 DIAGNOSIS — I120 Hypertensive chronic kidney disease with stage 5 chronic kidney disease or end stage renal disease: Secondary | ICD-10-CM | POA: Diagnosis not present

## 2017-09-25 DIAGNOSIS — I959 Hypotension, unspecified: Secondary | ICD-10-CM | POA: Diagnosis not present

## 2017-09-25 DIAGNOSIS — I482 Chronic atrial fibrillation: Secondary | ICD-10-CM | POA: Diagnosis not present

## 2017-09-25 DIAGNOSIS — K209 Esophagitis, unspecified: Secondary | ICD-10-CM | POA: Diagnosis not present

## 2017-09-25 DIAGNOSIS — K921 Melena: Secondary | ICD-10-CM | POA: Diagnosis not present

## 2017-09-25 DIAGNOSIS — K2951 Unspecified chronic gastritis with bleeding: Secondary | ICD-10-CM | POA: Diagnosis not present

## 2017-09-25 DIAGNOSIS — D62 Acute posthemorrhagic anemia: Secondary | ICD-10-CM | POA: Diagnosis not present

## 2017-09-25 HISTORY — PX: BIOPSY: SHX5522

## 2017-09-25 HISTORY — PX: ESOPHAGOGASTRODUODENOSCOPY (EGD) WITH PROPOFOL: SHX5813

## 2017-09-25 LAB — CBC
HEMATOCRIT: 24 % — AB (ref 39.0–52.0)
Hemoglobin: 7.3 g/dL — ABNORMAL LOW (ref 13.0–17.0)
MCH: 27.2 pg (ref 26.0–34.0)
MCHC: 30.4 g/dL (ref 30.0–36.0)
MCV: 89.6 fL (ref 78.0–100.0)
Platelets: 78 10*3/uL — ABNORMAL LOW (ref 150–400)
RBC: 2.68 MIL/uL — ABNORMAL LOW (ref 4.22–5.81)
RDW: 18.3 % — AB (ref 11.5–15.5)
WBC: 4.1 10*3/uL (ref 4.0–10.5)

## 2017-09-25 LAB — BASIC METABOLIC PANEL
ANION GAP: 14 (ref 5–15)
BUN: 18 mg/dL (ref 6–20)
CALCIUM: 8.2 mg/dL — AB (ref 8.9–10.3)
CO2: 28 mmol/L (ref 22–32)
Chloride: 94 mmol/L — ABNORMAL LOW (ref 101–111)
Creatinine, Ser: 6.16 mg/dL — ABNORMAL HIGH (ref 0.61–1.24)
GFR, EST AFRICAN AMERICAN: 11 mL/min — AB (ref >60)
GFR, EST NON AFRICAN AMERICAN: 9 mL/min — AB (ref >60)
Glucose, Bld: 69 mg/dL (ref 65–99)
Potassium: 3.4 mmol/L — ABNORMAL LOW (ref 3.5–5.1)
Sodium: 136 mmol/L (ref 135–145)

## 2017-09-25 LAB — PROTIME-INR
INR: 1.53
Prothrombin Time: 18.3 seconds — ABNORMAL HIGH (ref 11.4–15.2)

## 2017-09-25 LAB — PREPARE RBC (CROSSMATCH)

## 2017-09-25 SURGERY — ESOPHAGOGASTRODUODENOSCOPY (EGD) WITH PROPOFOL
Anesthesia: Monitor Anesthesia Care

## 2017-09-25 MED ORDER — PROPOFOL 10 MG/ML IV BOLUS
INTRAVENOUS | Status: DC | PRN
  Administered 2017-09-25: 25 mg via INTRAVENOUS

## 2017-09-25 MED ORDER — SUCRALFATE 1 G PO TABS
1.0000 g | ORAL_TABLET | Freq: Two times a day (BID) | ORAL | Status: DC
  Administered 2017-09-25 – 2017-09-26 (×3): 1 g via ORAL
  Filled 2017-09-25 (×3): qty 1

## 2017-09-25 MED ORDER — PROPOFOL 500 MG/50ML IV EMUL
INTRAVENOUS | Status: DC | PRN
  Administered 2017-09-25: 100 ug/kg/min via INTRAVENOUS

## 2017-09-25 MED ORDER — MIDODRINE HCL 5 MG PO TABS
10.0000 mg | ORAL_TABLET | Freq: Three times a day (TID) | ORAL | Status: DC
  Administered 2017-09-25 – 2017-09-26 (×5): 10 mg via ORAL
  Filled 2017-09-25 (×5): qty 2

## 2017-09-25 MED ORDER — ALBUMIN HUMAN 25 % IV SOLN
25.0000 g | Freq: Once | INTRAVENOUS | Status: AC
  Administered 2017-09-25: 25 g via INTRAVENOUS

## 2017-09-25 MED ORDER — SODIUM CHLORIDE 0.9 % IV SOLN: Freq: Once | INTRAVENOUS | Status: DC

## 2017-09-25 MED ORDER — SODIUM CHLORIDE 0.9 % IV SOLN
INTRAVENOUS | Status: DC
  Administered 2017-09-25: 10:00:00 via INTRAVENOUS

## 2017-09-25 MED ORDER — ALBUMIN HUMAN 25 % IV SOLN
INTRAVENOUS | Status: AC
  Administered 2017-09-25: 25 g via INTRAVENOUS
  Filled 2017-09-25: qty 100

## 2017-09-25 MED ORDER — DARBEPOETIN ALFA 200 MCG/0.4ML IJ SOSY
200.0000 ug | PREFILLED_SYRINGE | INTRAMUSCULAR | Status: DC
  Administered 2017-09-25: 200 ug via INTRAVENOUS
  Filled 2017-09-25: qty 0.4

## 2017-09-25 MED ORDER — EPHEDRINE SULFATE 50 MG/ML IJ SOLN
INTRAMUSCULAR | Status: DC | PRN
  Administered 2017-09-25: 10 mg via INTRAVENOUS

## 2017-09-25 MED ORDER — RENA-VITE PO TABS
1.0000 | ORAL_TABLET | Freq: Every day | ORAL | Status: DC
  Administered 2017-09-25: 1 via ORAL
  Filled 2017-09-25: qty 1

## 2017-09-25 MED ORDER — PHENYLEPHRINE HCL 10 MG/ML IJ SOLN
INTRAMUSCULAR | Status: DC | PRN
  Administered 2017-09-25: 120 ug via INTRAVENOUS
  Administered 2017-09-25: 40 ug via INTRAVENOUS
  Administered 2017-09-25 (×3): 80 ug via INTRAVENOUS

## 2017-09-25 MED ORDER — TADALAFIL (PAH) 20 MG PO TABS
20.0000 mg | ORAL_TABLET | Freq: Every day | ORAL | Status: DC
  Administered 2017-09-25 – 2017-09-26 (×2): 20 mg via ORAL
  Filled 2017-09-25 (×3): qty 1

## 2017-09-25 MED ORDER — LIDOCAINE HCL (CARDIAC) PF 100 MG/5ML IV SOSY
PREFILLED_SYRINGE | INTRAVENOUS | Status: DC | PRN
  Administered 2017-09-25: 60 mg via INTRATRACHEAL

## 2017-09-25 SURGICAL SUPPLY — 14 items

## 2017-09-25 NOTE — Brief Op Note (Signed)
09/23/2017 - 09/25/2017  10:59 AM  PATIENT:  Adam [name].  57 y.o. male  PRE-OPERATIVE DIAGNOSIS:  Melena, anemia  POST-OPERATIVE DIAGNOSIS:  proximal gastric body mucosal changes, esophageal ulcer, esophagitis  PROCEDURE:  Procedure(s): ESOPHAGOGASTRODUODENOSCOPY (EGD) WITH PROPOFOL (N/A) BIOPSY  SURGEON:  Surgeon(s) and Role:    Ronnette Juniper, MD - Primary  PHYSICIAN ASSISTANT:   ASSISTANTS: Burtis Junes, RN, Aneta Mins, Tech  ANESTHESIA:   MAC  EBL:  Minimal  BLOOD ADMINISTERED:none  DRAINS: none   LOCAL MEDICATIONS USED:  NONE  SPECIMEN:  Biopsy / Limited Resection  DISPOSITION OF SPECIMEN:  PATHOLOGY  COUNTS:  YES  TOURNIQUET:  * No tourniquets in log *  DICTATION: .Dragon Dictation  PLAN OF CARE: Admit to inpatient   PATIENT DISPOSITION:  PACU - hemodynamically stable.   Delay start of Pharmacological VTE agent (>24hrs) due to surgical blood loss or risk of bleeding: yes

## 2017-09-25 NOTE — Transfer of Care (Signed)
Immediate Anesthesia Transfer of Care Note  Patient: Adam [name].  Procedure(s) Performed: ESOPHAGOGASTRODUODENOSCOPY (EGD) WITH PROPOFOL (N/A ) BIOPSY  Patient Location: Endoscopy Unit  Anesthesia Type:MAC  Level of Consciousness: awake, alert  and oriented  Airway & Oxygen Therapy: Patient Spontanous Breathing and Patient connected to nasal cannula oxygen  Post-op Assessment: Report given to RN, Post -op Vital signs reviewed and stable and Patient moving all extremities X 4  Post vital signs: Reviewed and stable  Last Vitals:  Vitals Value Taken Time  BP    Temp    Pulse 74 09/25/2017 11:00 AM  Resp 16 09/25/2017 11:00 AM  SpO2 99 % 09/25/2017 11:00 AM  Vitals shown include unvalidated device data.  Last Pain:  Vitals:   09/25/17 0930  TempSrc: Oral  PainSc: 0-No pain         Complications: No apparent anesthesia complications

## 2017-09-25 NOTE — Consult Note (Signed)
[place] Nurse wound consult note I spoke over the phone via Nelma Rothman in the [place], and relayed the following information to Dr. Bridgett Larsson.  VAC therapy to the left antecubital wound is no longer needed and can be managed with a simple Xeroform gauze dressing, and the remaining staples need to be removed.  She verified with Dr. Bridgett Larsson that this plan is acceptable.  Orders have been entered to this effect. Monitor the wound area(s) for worsening of condition such as: Signs/symptoms of infection,  Increase in size,  Development of or worsening of odor, Development of pain, or increased pain at the affected locations.  Notify the medical team if any of these develop.  Thank you for the consult.  Discussed plan of care with the patient and bedside nurse.  [place] nurse will not follow at this time.  Please re-consult the [place] team if needed.  Val Riles, RN, MSN, CWOCN, CNS-BC, pager [phone]

## 2017-09-25 NOTE — Anesthesia Postprocedure Evaluation (Signed)
Anesthesia Post Note  Patient: [place].  Procedure(s) Performed: ESOPHAGOGASTRODUODENOSCOPY (EGD) WITH PROPOFOL (N/A ) BIOPSY     Patient location during evaluation: Endoscopy Anesthesia Type: MAC Level of consciousness: awake and alert Pain management: pain level controlled Vital Signs Assessment: post-procedure vital signs reviewed and stable Respiratory status: spontaneous breathing, nonlabored ventilation, respiratory function stable and patient connected to nasal cannula oxygen Cardiovascular status: blood pressure returned to baseline and stable Postop Assessment: no apparent nausea or vomiting Anesthetic complications: no    Last Vitals:  Vitals:   09/25/17 1110 09/25/17 1120  BP: (!) 103/56 108/65  Pulse: 71 (!) 109  Resp: 15 (!) 26  Temp:    SpO2: 97% 100%    Last Pain:  Vitals:   09/25/17 1120  TempSrc:   PainSc: 0-No pain                 Ernestine Rohman DANIEL

## 2017-09-25 NOTE — Progress Notes (Signed)
   Daily Progress Note   Assessment/Planning:   S/p I&D L UA absces, excision of distal half of L BC AVF with stent, VPA L brachial artery (08/24/17)   L arm wounds near healed except for remaining re-epithelization  R arm studies on follow up to evaluate for new access   Subjective   No complaints, staples and VAC removed by WOC   Objective   Vitals:   09/25/17 0930 09/25/17 1100 09/25/17 1110 09/25/17 1120  BP: 110/72 102/78 (!) 103/56 108/65  Pulse: 76 74 71 (!) 109  Resp: 18 16 15  (!) 26  Temp: 97.7 F (36.5 C) 97.9 F (36.6 C)    TempSrc: Oral Oral    SpO2: 100% 99% 97% 100%  Weight: 270 lb (122.5 kg)     Height: 6\' 1"  (1.854 m)        Intake/Output Summary (Last 24 hours) at 09/25/2017 1341 Last data filed at 09/25/2017 1143 Gross per 24 hour  Intake 320 ml  Output -  Net 320 ml    LUA: prior BC AVF excision incision near healed with re-epithelization imminent as granulation tissue has healed to skin level, palpable brachial and radial pulse  Laboratory   CBC CBC Latest Ref Rng & Units 09/25/2017 09/24/2017 09/24/2017  WBC 4.0 - 10.5 K/uL 4.1 4.7 5.3  Hemoglobin 13.0 - 17.0 g/dL 7.3(L) 7.6(L) 7.7(L)  Hematocrit 39.0 - 52.0 % 24.0(L) 25.0(L) 25.0(L)  Platelets 150 - 400 K/uL 78(L) 84(L) 89(L)    BMET    Component Value Date/Time   NA 136 09/25/2017 0814   K 3.4 (L) 09/25/2017 0814   CL 94 (L) 09/25/2017 0814   CO2 28 09/25/2017 0814   GLUCOSE 69 09/25/2017 0814   BUN 18 09/25/2017 0814   CREATININE 6.16 (H) 09/25/2017 0814   CREATININE 4.91 (H) 08/13/2015 1223   CALCIUM 8.2 (L) 09/25/2017 0814   GFRNONAA 9 (L) 09/25/2017 0814   GFRNONAA 13 (L) 07/27/2014 1539   GFRAA 11 (L) 09/25/2017 0814   GFRAA 15 (L) 07/27/2014 1539     Adele Barthel, MD, FACS [facility] Office: [phone] Pager: [phone]  09/25/2017, 1:41 PM

## 2017-09-25 NOTE — Op Note (Signed)
[facility] Patient Name: Adam [name] Procedure Date : 09/25/2017 MRN: [id number] Attending MD: Ronnette Juniper , MD Date of Birth: March 24, 1961 CSN: [id number] Age: 57 Admit Type: Inpatient Procedure:                Upper GI endoscopy Indications:              Acute post hemorrhagic anemia, Melena Providers:                Ronnette Juniper, MD, Burtis Junes, RN, Alan Mulder,                            Technician, Lance Coon, CRNA Referring MD:              Medicines:                Monitored Anesthesia Care Complications:            No immediate complications. Estimated Blood Loss:     Estimated blood loss was minimal. Procedure:                Pre-Anesthesia Assessment:                           - Prior to the procedure, a History and Physical                            was performed, and patient medications and                            allergies were reviewed. The patient's tolerance of                            previous anesthesia was also reviewed. The risks                            and benefits of the procedure and the sedation                            options and risks were discussed with the patient.                            All questions were answered, and informed consent                            was obtained. Prior Anticoagulants: The patient has                            taken Coumadin (warfarin), last dose was 4 days                            prior to procedure. ASA Grade Assessment: III - A                            patient with severe systemic disease. After  reviewing the risks and benefits, the patient was                            deemed in satisfactory condition to undergo the                            procedure.                           After obtaining informed consent, the endoscope was                            passed under direct vision. Throughout the                            procedure, the patient's blood  pressure, pulse, and                            oxygen saturations were monitored continuously. The                            [id number] ([id number]) scope was introduced through the                            mouth, and advanced to the second part of duodenum.                            The upper GI endoscopy was accomplished without                            difficulty. The patient tolerated the procedure                            well. Scope In: Scope Out: Findings:      LA Grade C (one or more mucosal breaks continuous between tops of 2 or       more mucosal folds, less than 75% circumference) esophagitis with no       bleeding was found 40 to 46 cm from the incisors. Biopsies were taken       with a cold forceps for histology.      Few linear and superficial esophageal ulcers with no bleeding and no       stigmata of recent bleeding were found 40 to 46 cm from the incisors.      Diffuse severe mucosal changes characterized by congestion, erythema,       erosion, hemorrhagic appearance and petechiae were found in the cardia,       in the gastric fundus, on the anterior wall of the gastric body, on the       greater curvature of the gastric body, on the lesser curvature of the       gastric body, in the cardia (on retroflexion), in the gastric fundus (on       retroflexion), on the anterior wall of the gastric body (on       retroflexion), on the greater curvature of the gastric body (on       retroflexion) and  on the lesser curvature of the gastric body (on       retroflexion). Biopsies were taken with a cold forceps for histology.      The incisura, gastric antrum, prepyloric region of the stomach and       pylorus were normal.      The examined duodenum was normal. Impression:               - LA Grade C esophagitis. Biopsied.                           - Non-bleeding esophageal ulcers.                           - Congestion, erythema, erosion, hemorrhagic                             appearance and petechiae mucosa in the cardia,                            gastric fundus, anterior wall of the gastric body,                            greater curvature of the gastric body and lesser                            curvature of the gastric body. Biopsied.                           - Normal incisura, antrum, prepyloric region of the                            stomach and pylorus.                           - Normal examined duodenum. Moderate Sedation:      Patient did not receive moderate sedation for this procedure, but       instead received monitored anesthesia care. Recommendation:           - Await pathology results.                           - Repeat upper endoscopy in 2 months to check                            healing.                           - Use Protonix (pantoprazole) 40 mg PO BID for 2                            months.                           - Use sucralfate tablets 1 gram PO BID for 2 months. Procedure Code(s):        --- Professional ---  63817, Esophagogastroduodenoscopy, flexible,                            transoral; with biopsy, single or multiple Diagnosis Code(s):        --- Professional ---                           K20.9, Esophagitis, unspecified                           K22.10, Ulcer of esophagus without bleeding                           D62, Acute posthemorrhagic anemia                           K92.1, Melena (includes Hematochezia) CPT copyright 2017 [employer]. All rights reserved. The codes documented in this report are preliminary and upon coder review may  be revised to meet current compliance requirements. Ronnette Juniper, MD 09/25/2017 10:59:09 AM This report has been signed electronically. Number of Addenda: 0

## 2017-09-25 NOTE — Anesthesia Preprocedure Evaluation (Signed)
Anesthesia Evaluation  Patient identified by MRN, date of birth, ID band Patient awake    Reviewed: Allergy & Precautions, NPO status , Patient's Chart, lab work & pertinent test results  History of Anesthesia Complications (+) PONV and history of anesthetic complications  Airway Mallampati: I  TM Distance: >3 FB Neck ROM: Full    Dental  (+) Edentulous Upper, Dental Advisory Given, Missing   Pulmonary pneumonia,    breath sounds clear to auscultation       Cardiovascular hypertension, + Peripheral Vascular Disease  + dysrhythmias Atrial Fibrillation + Cardiac Defibrillator + Valvular Problems/Murmurs AS  Rhythm:Regular Rate:Normal  EF Normal. Moderate AS. Moderate TR. Dilated RV.   Neuro/Psych negative neurological ROS     GI/Hepatic negative GI ROS, Neg liver ROS,   Endo/Other  Hypothyroidism   Renal/GU ESRF and DialysisRenal disease     Musculoskeletal  (+) Arthritis ,   Abdominal (+) + obese,   Peds  Hematology  (+) anemia ,   Anesthesia Other Findings   Reproductive/Obstetrics                             Lab Results  Component Value Date   WBC 4.1 09/25/2017   HGB 7.3 (L) 09/25/2017   HCT 24.0 (L) 09/25/2017   MCV 89.6 09/25/2017   PLT 78 (L) 09/25/2017   Lab Results  Component Value Date   CREATININE 6.16 (H) 09/25/2017   BUN 18 09/25/2017   NA 136 09/25/2017   K 3.4 (L) 09/25/2017   CL 94 (L) 09/25/2017   CO2 28 09/25/2017   Lab Results  Component Value Date   INR 1.53 09/25/2017   INR 2.39 09/23/2017   INR 3.7 09/21/2017   Echo: - Left ventricle: The cavity size was normal. Wall thickness was   increased in a pattern of mild LVH. Systolic function was normal.   The estimated ejection fraction was in the range of 60% to 65%.   Wall motion was normal; there were no regional wall motion   abnormalities. Doppler parameters are consistent with abnormal   left  ventricular relaxation (grade 1 diastolic dysfunction). The   E/e&' ratio is between 8-15, suggesting indeterminate LV filling   pressure. - Ventricular septum: Septal motion showed abnormal function and   dyssynergy. The contour showed diastolic flattening and systolic   flattening. - Aortic valve: Moderate calcified aortic stenosis. Mean gradient   (S): 25 mm Hg. Peak gradient (S): 40 mm Hg. Valve area (VTI):   1.15 cm^2. Valve area (Vmax): 1.11 cm^2. Valve area (Vmean): 1.16   cm^2. - Mitral valve: Calcified annulus. Mildly thickened leaflets . Mild   to moderate stenosis. Mean gradient (D): 8 mm Hg. Valve area by   continuity equation (using LVOT flow): 1.25 cm^2. - Left atrium: The atrium was mildly dilated. - Right ventricle: The cavity size was moderately dilated. - Right atrium: Severely dilated. - Tricuspid valve: There was moderate regurgitation. - Pulmonary arteries: PA peak pressure: 59 mm Hg (S). - Inferior vena cava: The vessel was dilated. The respirophasic   diameter changes were blunted (< 50%), consistent with elevated   central venous pressure.  Anesthesia Physical  Anesthesia Plan  ASA: III  Anesthesia Plan: MAC   Post-op Pain Management:    Induction: Intravenous  PONV Risk Score and Plan: 3 and Ondansetron, Dexamethasone, Treatment may vary due to age or medical condition and Propofol infusion  Airway Management Planned: Natural  Airway and Simple Face Mask  Additional Equipment:   Intra-op Plan:   Post-operative Plan:   Informed Consent: I have reviewed the patients History and Physical, chart, labs and discussed the procedure including the risks, benefits and alternatives for the proposed anesthesia with the patient or authorized representative who has indicated his/her understanding and acceptance.   Dental advisory given  Plan Discussed with: CRNA  Anesthesia Plan Comments:         Anesthesia Quick Evaluation

## 2017-09-25 NOTE — Discharge Summary (Signed)
[facility] Discharge Summary  Patient name: Adam [name]. Medical record number: [id number] Date of birth: [birth year] Age: 57 y.o. Gender: male Date of Admission: 09/23/2017  Date of Discharge: 09/26/17 Admitting Physician: Alveda Reasons, MD  Primary Care Provider: [name], MD Consultants: nephrology/ vascular, cardiology, GI  Indication for Hospitalization: near syncope  Discharge Diagnoses/Problem List:  Near syncope Afib ESRD on HD Subacute anemia Pulmonary hypertension AV fistula wound (healing appropriately) Hypotenstion Hypothyroidism HLD  Disposition: to home  Discharge Condition: stable  Discharge Exam: (per resident on shift day of d/c) General: NAD, pleasant Eyes:  no conjunctival pallor or injection, arcus senilus ENTM: Moist mucous membranes Cardiovascular: RRR, 3/6 murmur, no LE edema, clean dressing on wound on left arm from fistula removal Respiratory: CTA BL, normal work of breathing Gastrointestinal: soft, nontender, nondistended, normoactive BS MSK: moves 4 extremities equally Derm: no rashes appreciated Neuro: CN II-XII grossly intact Psych: AOx3, appropriate affect  Brief Hospital Course:  Patient admitted for near syncope and found to be anemic to 6.6 which improved to 7.7 with 1u transfusion.  FOBT was positive and patient had been complaining of tarry stars (Cdiff neg).  They had recent admission for sepsis and ICD and fistula had been removed.  Hgb stayed stable after transfusion and GI performed endoscopy showing multiple significant hemmorhagic appearing mucosal changes.   Cardiology said that life vest should be worn until ICD disposition in outpatient visit but that patient was stable from cardiac standpoint.    Issues for Follow Up:  1. Patient instructed to wear life vest until followup with Dr. Lovena Le to discuss ICD replacement 2. Patient needs GI followup  3. Patient should continue to  followup with nephrology for ESRD and HD access 4. Given GI findings and recent significant anemia, consider appropriate tracking for resolution of anemia  Significant Procedures: EGD  Significant Labs and Imaging:  Recent Labs  Lab 09/24/17 1836 09/25/17 0814 09/26/17 0526  WBC 4.7 4.1 3.9*  HGB 7.6* 7.3* 8.5*  HCT 25.0* 24.0* 27.9*  PLT 84* 78* 81*   Recent Labs  Lab 09/23/17 2350 09/24/17 0757 09/24/17 1836 09/25/17 0814 09/26/17 0526  NA 136 137 136 136 139  K 3.4* 5.1 3.5 3.4* 3.7  CL 94* 96* 95* 94* 100*  CO2 '28 26 28 28 27  '$ GLUCOSE 86 77 82 69 62*  BUN '12 15 16 18 12  '$ CREATININE 4.63* 4.87* 5.40* 6.16* 4.61*  CALCIUM 8.2* 8.2* 8.3* 8.2* 8.3*  PHOS  --  2.9 2.7  --   --   ALKPHOS 107  --   --   --   --   AST 18  --   --   --   --   ALT <5*  --   --   --   --   ALBUMIN 2.9* 3.2* 3.2*  --   --     Dg Chest 2 View  Result Date: 09/24/2017 CLINICAL DATA:  Acute onset of syncope. EXAM: CHEST - 2 VIEW COMPARISON:  Chest radiograph performed 09/01/2017 FINDINGS: The lungs are well-aerated. Vascular congestion is noted. There is no evidence of focal opacification, pleural effusion or pneumothorax. The heart is mildly enlarged. No acute osseous abnormalities are seen. A left-sided dual-lumen catheter is noted ending about the cavoatrial junction. IMPRESSION: Vascular congestion and mild cardiomegaly; lungs remain grossly clear. Electronically Signed   By: Garald Balding M.D.   On: 09/24/2017 01:02   Ct Head Wo Contrast  Result Date: 09/24/2017 CLINICAL DATA:  57 y/o  M; syncope with seizure-like activity. EXAM: CT HEAD WITHOUT CONTRAST TECHNIQUE: Contiguous axial images were obtained from the base of the skull through the vertex without intravenous contrast. COMPARISON:  04/14/2016 sinus CT. FINDINGS: Brain: No evidence of acute infarction, hemorrhage, hydrocephalus, extra-axial collection or mass lesion/mass effect. Vascular: Extensive calcification of carotid siphons,  vertebral arteries, in median vessels of scalp and visible face. Skull: Normal. Negative for fracture or focal lesion. Sinuses/Orbits: Near absence of bony structures of the nasal passages including nasal septum and turbinates. Near complete absence of ethmoid air cells and large defect within the left maxillary sinus medial wall. Apparent bony dehiscence of the right lamina papyracea and of the right-greater-than-left olfactory grooves. Moderate mucosal thickening of the walls of the open nasal cavity and sphenoid sinuses with opacification of the right maxillary sinus. Chronic inflammatory changes of the walls of the sinuses. Findings are similar to prior sinus CT given differences in technique. Normal aeration of mastoid air cells.  Orbits are unremarkable. Other: None. IMPRESSION: 1. No acute intracranial abnormality. 2. 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 Extensive paranasal sinus disease of the residual sinuses. Findings are similar to prior CT sinus. Electronically Signed   By: Kristine Garbe M.D.   On: 09/24/2017 00:47    Results/Tests Pending at Time of Discharge:   Discharge Medications:  Allergies as of 09/26/2017      Reactions   Enalaprilat Swelling   Mouth swelling   Vasotec [enalapril] Swelling   MOUTH SWELLING   Iodinated Diagnostic Agents Nausea And Vomiting, Nausea Only      Medication List    TAKE these medications   ambrisentan 10 MG tablet Commonly known as:  LETAIRIS Take 1 tablet (10 mg total) by mouth daily.   amiodarone 200 MG tablet Commonly known as:  PACERONE Take 200 mg by mouth every evening.   atorvastatin 40 MG tablet Commonly known as:  LIPITOR Take 1 tablet (40 mg total) by mouth every evening.   calcium acetate 667 MG capsule Commonly known as:  PHOSLO Take 2 capsules (1,334 mg total) by mouth 3 (three) times daily with meals.   ceFAZolin IVPB Commonly known as:  ANCEF Inject 2 g into the vein every dialysis (GIVE AT  END OF EACH HD SESSION (MWF schedule)). Indication: MSSA Bacteremia Last Day of Therapy: 10/05/17 Labs - Once weekly:  CBC/D and BMP, Labs - Every other week:  ESR and CRP   cinacalcet 30 MG tablet Commonly known as:  SENSIPAR Take 3 tablets (90 mg total) by mouth every evening. What changed:  medication strength   levothyroxine 300 MCG tablet Commonly known as:  SYNTHROID, LEVOTHROID Take 1 tablet (300 mcg total) by mouth daily before breakfast. What changed:  when to take this   midodrine 10 MG tablet Commonly known as:  PROAMATINE Take 1 tablet (10 mg total) by mouth 3 (three) times daily.   multivitamin Tabs tablet Take 1 tablet by mouth at bedtime.   pantoprazole 40 MG tablet Commonly known as:  PROTONIX Take 1 tablet (40 mg total) by mouth 2 (two) times daily before a meal.   sucralfate 1 g tablet Commonly known as:  CARAFATE Take 1 tablet (1 g total) by mouth 2 (two) times daily.   tadalafil (PAH) 20 MG tablet Commonly known as:  ADCIRCA 1 tab daily What changed:    how much to take  how to take this  when to take this  additional instructions   warfarin 6 MG tablet Commonly known as:  COUMADIN Take as directed. If you are unsure how to take this medication, talk to your nurse or doctor. Original instructions:  Take 1 tablet (6 mg total) by mouth one time only at 6 PM.       Discharge Instructions: Please refer to Patient Instructions section of EMR for full details.  Patient was counseled important signs and symptoms that should prompt return to medical care, changes in medications, dietary instructions, activity restrictions, and follow up appointments.   Follow-Up Appointments: Follow-up Information    Evans Lance, MD Follow up on 10/21/2017.   Specialty:  Cardiology Why:  Please arrive 15 minutes early for your 18:30am appointment Contact information: [address] [place] [address] [phone]        Minus Breeding, MD Follow up on 11/03/2017.   Specialty:  Cardiology Why:  Please arrive 15 minutes early for your 11:00am appointment Contact information: [address] [place] [address] [place] Follow up.   Specialty:  Home Health Services Contact information: [place] [address] [place]. Go on 09/29/2017.   Specialty:  Family Medicine Why:  at 1:30 with Dr. Avon Gully for Hospital Follow up  Contact information: [address] [id number] [place] [address] [place], [place], DO 09/28/2017, 8:11 PM PGY-1, [place]

## 2017-09-25 NOTE — Progress Notes (Signed)
Initial Nutrition Assessment  DOCUMENTATION CODES:   Obesity unspecified  INTERVENTION:   - Continue Boost Breeze po TID, each supplement provides 250 kcal and 9 grams of protein (pt does not like Nepro)  - rena-vit daily  - Encourage PO intake  NUTRITION DIAGNOSIS:   Inadequate oral intake related to decreased appetite(per pt, related to IV antibiotics) as evidenced by per patient/family report.  GOAL:   Patient will meet greater than or equal to 90% of their needs  MONITOR:   PO intake, Supplement acceptance, Labs, I & O's, Weight trends, Skin  REASON FOR ASSESSMENT:   Malnutrition Screening Tool    ASSESSMENT:   57 year old male who presented to ED after experiencing seizure-like activity. PMH significant for ESRD on home hemodialysis Monday through Friday for the past year, renal transplant 2009, failed renal transplant 2010, atrial fibrillation on Coumadin, hypertension, and nonischemic cardiomyopathy. Recent admission in April for MSSA bacteremia and underwent defibrillator and AV fistula removal. Hemodialysis now being performed in dialysis center MWF until completion of antibiotics.  Pt s/p EGD this morning showing esophagitis and no evidence of GI bleed. Biopsies pending.  Spoke with pt at bedside who reports that he has had a poor appetite since having his fistula and defibrillator were removed 3 weeks ago. Pt believes that the IV antibiotics that he has been taking have caused a decreased appetite.  Pt reports eating 1-2 meals daily in the month PTA. A meal might include a sandwich or soup.  Pt does not like Nepro oral nutrition supplements and would like to continue receiving Boost Breeze.  Pt reports his UBW as 280 lbs and that he last weighed this 1.5 months ago. Current weight in chart of 270 lbs appears stated rather than measured.  Medications reviewed and include: Phoslo daily, Boost Breeze TID, 300 mcg levothyroxine daily, 40 mg Protonix BID  Labs  reviewed: potassium 3.4 (L), chloride 94 (L), creatinine 6.16 (H), hemoglobin 7.3 (L), HCT 24.0 (L) CBG: 90  NUTRITION - FOCUSED PHYSICAL EXAM:    Most Recent Value  Orbital Region  No depletion  Upper Arm Region  Mild depletion  Thoracic and Lumbar Region  No depletion  Buccal Region  No depletion  Temple Region  No depletion  Clavicle Bone Region  No depletion  Clavicle and Acromion Bone Region  No depletion  Scapular Bone Region  No depletion  Dorsal Hand  Mild depletion  Patellar Region  Mild depletion  Anterior Thigh Region  Mild depletion  Posterior Calf Region  No depletion  Edema (RD Assessment)  None  Hair  Reviewed  Eyes  Reviewed  Mouth  Reviewed  Skin  Reviewed  Nails  Reviewed       Diet Order:   Diet Order           Diet renal with fluid restriction Fluid restriction: 1200 mL Fluid; Room service appropriate? Yes; Fluid consistency: Thin  Diet effective now          EDUCATION NEEDS:   No education needs have been identified at this time  Skin:  Skin Assessment: Skin Integrity Issues: Skin Integrity Issues:: Wound VAC Wound Vac: wound vac to surgical incision on left upper arm  Last BM:  09/24/17  Height:   Ht Readings from Last 1 Encounters:  09/25/17 6\' 1"  (1.854 m)    Weight:   Wt Readings from Last 1 Encounters:  09/25/17 270 lb (122.5 kg)    Ideal Body Weight:  83.6 kg  BMI:  Body mass index is 35.62 kg/m.  Estimated Nutritional Needs:   Kcal:  2200-2400 kcal/day  Protein:  120-135 grams/day  Fluid:  1200 ml fluid restriction    Gaynell Face, MS, RD, LDN Pager: [phone] Weekend/After Hours: [phone]

## 2017-09-25 NOTE — Progress Notes (Signed)
Physical Therapy Treatment Patient Details Name: Adam [name]. MRN: [id number] DOB: 1960-07-28 Today's Date: 09/25/2017    History of Present Illness Pt is a 57 y/o male admitted secondary to near syncope. Also found to have low hemoglobin. Pt recently admitted dor L AV fistula removal. PMH includes ESRD on HD MWF, HTN, nonischemic cardiomyopathy, and s/p ICD removal secondary to infection.    PT Comments    Patient is progressing well toward PT goals and tolerated increased gait distance and stair training this session. Current plan remains appropriate.    Follow Up Recommendations  Outpatient PT     Equipment Recommendations  Other (comment)(tub bench )    Recommendations for Other Services       Precautions / Restrictions Restrictions Weight Bearing Restrictions: No    Mobility  Bed Mobility               General bed mobility comments: Sitting at EOB upon entry.   Transfers Overall transfer level: Modified independent Equipment used: None Transfers: Sit to/from Stand           General transfer comment: requires bilat UE to stand  Ambulation/Gait Ambulation/Gait assistance: Supervision Ambulation Distance (Feet): 200 Feet Assistive device: None Gait Pattern/deviations: Step-through pattern;Decreased stride length;Wide base of support Gait velocity: Decreased    General Gait Details: lateral sway and wide BOS; pt with grossly steady gait    Stairs Stairs: Yes Stairs assistance: Min guard Stair Management: Two rails;Step to pattern;Forwards Number of Stairs: 10 General stair comments: step to pattern due to bilat knee pain and limited R knee ROM as reported by pain; standing break half way up stairs due to SOB; HR and SpO2 WNL   Wheelchair Mobility    Modified Rankin (Stroke Patients Only)       Balance Overall balance assessment: Needs assistance Sitting-balance support: No upper extremity supported;Feet supported Sitting  balance-Leahy Scale: Good     Standing balance support: No upper extremity supported;During functional activity Standing balance-Leahy Scale: Fair                              Cognition Arousal/Alertness: Awake/alert Behavior During Therapy: WFL for tasks assessed/performed Overall Cognitive Status: Within Functional Limits for tasks assessed                                        Exercises      General Comments General comments (skin integrity, edema, etc.): wife present in room      Pertinent Vitals/Pain Pain Assessment: No/denies pain    Home Living                      Prior Function            PT Goals (current goals can now be found in the care plan section) Acute Rehab PT Goals Patient Stated Goal: to go home  PT Goal Formulation: With patient Time For Goal Achievement: 10/08/17 Potential to Achieve Goals: Good Progress towards PT goals: Progressing toward goals    Frequency    Min 3X/week      PT Plan Current plan remains appropriate    Co-evaluation              AM-PAC PT "6 Clicks" Daily Activity  Outcome Measure  Difficulty turning over in bed (including adjusting  bedclothes, sheets and blankets)?: None Difficulty moving from lying on back to sitting on the side of the bed? : A Little Difficulty sitting down on and standing up from a chair with arms (e.g., wheelchair, bedside commode, etc,.)?: A Little Help needed moving to and from a bed to chair (including a wheelchair)?: A Little Help needed walking in hospital room?: A Little Help needed climbing 3-5 steps with a railing? : A Little 6 Click Score: 19    End of Session   Activity Tolerance: Patient tolerated treatment well Patient left: with call bell/phone within reach;with family/visitor present(sitting EOB) Nurse Communication: Mobility status PT Visit Diagnosis: Muscle weakness (generalized) (M62.81);Unsteadiness on feet (R26.81)      Time: 1610-9604 PT Time Calculation (min) (ACUTE ONLY): 18 min  Charges:  $Gait Training: 8-22 mins                    G Codes:       Earney Navy, PTA Pager: [phone]     Darliss Cheney 09/25/2017, 1:33 PM

## 2017-09-25 NOTE — Anesthesia Procedure Notes (Signed)
Procedure Name: MAC Date/Time: 09/25/2017 10:36 AM Performed by: Mariea Clonts, CRNA Pre-anesthesia Checklist: Patient identified, Emergency Drugs available, Suction available, Patient being monitored and Timeout performed Patient Re-evaluated:Patient Re-evaluated prior to induction Oxygen Delivery Method: Nasal cannula

## 2017-09-25 NOTE — Op Note (Signed)
EGD was performed for melena and posthemorrhagic anemia.  Findings: LA grade C esophagitis noted from 40-46 cm from incisors along with, few linear and superficial esophageal ulcers.Biopsies obtained. Diffuse severe mucosal change characterized by congestion, erythema, erosion, hemorrhagic appearance and petechiae noted in cardia, gastric fundus, anterior wall of gastric body, greater curvature gastric body, lesser curvature gastric body. Biopsies obtained for histology. The incisura, antrum,pyloric area appeared unremarkable. Duodenal bulb and rest of the duodenum appeared unremarkable.  Recommendations: PPI twice a day for 2 months. Sucralfate 1 g by mouth twice a day for 2 months. Await biopsy results. Resume Coumadin in a.m. Outpatient colonoscopy for screening. Regular diet. Monitor H&H and transfuse if hemoglobin less than 7.  Ronnette Juniper, M.D.

## 2017-09-25 NOTE — Progress Notes (Signed)
[facility]  Patient Status: Active (receiving services up to time of hospitalization)  [facility] is providing the following services: RN  If patient discharges after hours, please call [phone].   Adam [name] 09/25/2017, 3:45 PM

## 2017-09-25 NOTE — Progress Notes (Signed)
Patient ID: Adam Alm., male   DOB: May 16, 1960, 57 y.o.   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  Also hematoma in the left groin.  On physical exam he does not have any tenderness over this area.  I do not feel any expansile nature of the fullness in his right or left groin.  Discussed with the patient and his wife present.  This is most likely related to the arterial line placed at the time of resuscitation.  And also hematoma from tunnel catheter removal.  Would not recommend any follow-up regarding this.

## 2017-09-25 NOTE — Progress Notes (Addendum)
[facility] Progress Note   Dialysis Orders: [facility] MWF 4 hrs 180 NRe 400/800 124.5 kg 2.0 K/ 2.25 Ca  -No Heparin -Mircera 225 mcg IV q 2 weeks (last dose 09/11/2017 Last HGB 7.7 09/16/2017)  Assessment/Plan: 1. Presyncope/possible vasovagal event from excessive coughing. CT of head neg. No prior history of seizure activity. Per primary- Low hgb very well could have been contributory 2. Possible GIB-FOBT positive. HGB 6.6 on adm. Rec'd 1 unit PRBCs now HGB 7.3 Has had issues with anemia since 08/31/2017. Upper endo today  grade C esophagitis/some esoph ulcers, bx done with sever diffuse mucosal changes - rec PPI bid x 2 months, sucralfate bid x 2 months - need to watch alum level - outpatient colonoscopy for screening. Plan transfuse another unit on HD today - no heparin HD due to GIB/low plts 3. Chronic Afib-ACID removed. Has had issues with elevated INR while on ABX for MSSA infection. Per primary. On amiodarone.  4. Recent MSSA sepsis- 4/19, ICD removed and infected fistula abscess I&D'd. Has continued on cefazolin 2 grams IV q treatment for a total of 12 doses, last dose due 09/28/2017. VAC on left arm wound 5. ESRD -  MWF via TDC.K 3.4 today - change to 4 K bath 6. Hypertension/volume  - BP controlled. Vascular congestion noted on CXR-usually gets to EDW-high gains. Current hospital wt 122.5 kg-unsure how this was obtained. Get standing wt with HD . May need to lower EDW. Midodrine for BP support 7. Anemia  - See # 2.ive Aranesp 200 mcg IV with HD 5/17 Follow HGB.  8. Metabolic bone disease - Ca ok  Phos 2.7 decreased binder dose and continue. Changed senispar to 30 mg QOD.  9. Nutrition - Albumin 3.2. Renal diet, nepro, renal vit 10. H/O pulmonary HTN on ambrisentan and Tadalafil.  11. Thrombocytopenia - plts 89 > 79  - follow (128 5/8)  Myriam Jacobson, PA-C [facility] Beeper [phone] 09/25/2017,12:03 PM  LOS: 0 days   Pt seen, examined and agree  w A/P as above.  Kelly Splinter MD [facility] pager [phone]   09/25/2017, 2:57 PM    Subjective:   No c/o  Objective Vitals:   09/25/17 0930 09/25/17 1100 09/25/17 1110 09/25/17 1120  BP: 110/72 102/78 (!) 103/56 108/65  Pulse: 76 74 71 (!) 109  Resp: 18 16 15  (!) 26  Temp: 97.7 F (36.5 C) 97.9 F (36.6 C)    TempSrc: Oral Oral    SpO2: 100% 99% 97% 100%  Weight: 122.5 kg (270 lb)     Height: 6\' 1"  (1.854 m)      Physical Exam General: NAD sitting on side of bed post endo Heart: RRR 2/6 murmur Lungs: no overt rales Abdomen: obese soft NT + BS Extremities: tr LE edema, VAC on left upper arm Dialysis Access: left IJ [facility]   Additional Objective Labs: Basic Metabolic Panel: Recent Labs  Lab 09/24/17 0757 09/24/17 1836 09/25/17 0814  NA 137 136 136  K 5.1 3.5 3.4*  CL 96* 95* 94*  CO2 26 28 28   GLUCOSE 77 82 69  BUN 15 16 18   CREATININE 4.87* 5.40* 6.16*  CALCIUM 8.2* 8.3* 8.2*  PHOS 2.9 2.7  --    Liver Function Tests: Recent Labs  Lab 09/23/17 2350 09/24/17 0757 09/24/17 1836  AST 18  --   --   ALT <5*  --   --   ALKPHOS 107  --   --  BILITOT 0.5  --   --   PROT 7.0  --   --   ALBUMIN 2.9* 3.2* 3.2*   No results for input(s): LIPASE, AMYLASE in the last 168 hours. CBC: Recent Labs  Lab 09/23/17 2350 09/24/17 0757 09/24/17 1836 09/25/17 0814  WBC 5.3 5.3 4.7 4.1  NEUTROABS 4.7  --   --   --   HGB 6.6* 7.7* 7.6* 7.3*  HCT 21.6* 25.0* 25.0* 24.0*  MCV 90.8 90.3 91.6 89.6  PLT 84* 89* 84* 78*   Blood Culture    Component Value Date/Time   SDES WOUND UPPER ARM PROXIMAL FISTULA 08/24/2017 1412   SPECREQUEST SPEC B ON SWABS 08/24/2017 1412   CULT  08/24/2017 1412    FEW STAPHYLOCOCCUS AUREUS NO ANAEROBES ISOLATED Performed at [facility] [address] [place], [place] [address]    REPTSTATUS 08/29/2017 FINAL 08/24/2017 1412    Cardiac Enzymes: Recent Labs  Lab 09/23/17 2350 09/24/17 0757  TROPONINI  0.04* 0.04*   CBG: Recent Labs  Lab 09/23/17 2323  GLUCAP 90   Iron Studies: No results for input(s): IRON, TIBC, TRANSFERRIN, FERRITIN in the last 72 hours. Lab Results  Component Value Date   INR 1.53 09/25/2017   INR 2.39 09/23/2017   INR 3.7 09/21/2017   Studies/Results: Dg Chest 2 View  Result Date: 09/24/2017 CLINICAL DATA:  Acute onset of syncope. EXAM: CHEST - 2 VIEW COMPARISON:  Chest radiograph performed 09/01/2017 FINDINGS: The lungs are well-aerated. Vascular congestion is noted. There is no evidence of focal opacification, pleural effusion or pneumothorax. The heart is mildly enlarged. No acute osseous abnormalities are seen. A left-sided dual-lumen catheter is noted ending about the cavoatrial junction. IMPRESSION: Vascular congestion and mild cardiomegaly; lungs remain grossly clear. Electronically Signed   By: Garald Balding M.D.   On: 09/24/2017 01:02   Ct Head Wo Contrast  Result Date: 09/24/2017 CLINICAL DATA:  57 y/o  M; syncope with seizure-like activity. EXAM: CT HEAD WITHOUT CONTRAST TECHNIQUE: Contiguous axial images were obtained from the base of the skull through the vertex without intravenous contrast. COMPARISON:  04/14/2016 sinus CT. FINDINGS: Brain: No evidence of acute infarction, hemorrhage, hydrocephalus, extra-axial collection or mass lesion/mass effect. Vascular: Extensive calcification of carotid siphons, vertebral arteries, in median vessels of scalp and visible face. Skull: Normal. Negative for fracture or focal lesion. Sinuses/Orbits: Near absence of bony structures of the nasal passages including nasal septum and turbinates. Near complete absence of ethmoid air cells and large defect within the left maxillary sinus medial wall. Apparent bony dehiscence of the right lamina papyracea and of the right-greater-than-left olfactory grooves. Moderate mucosal thickening of the walls of the open nasal cavity and sphenoid sinuses with opacification of the right  maxillary sinus. Chronic inflammatory changes of the walls of the sinuses. Findings are similar to prior sinus CT given differences in technique. Normal aeration of mastoid air cells.  Orbits are unremarkable. Other: None. IMPRESSION: 1. No acute intracranial abnormality. 2. 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 Extensive paranasal sinus disease of the residual sinuses. Findings are similar to prior CT sinus. Electronically Signed   By: Kristine Garbe M.D.   On: 09/24/2017 00:47   Medications: . sodium chloride    . sodium chloride    .  ceFAZolin (ANCEF) IV     . ambrisentan  10 mg Oral Daily  . amiodarone  200 mg Oral QPM  . atorvastatin  40 mg Oral QPM  . calcium  acetate  1,334 mg Oral TID WC  . cinacalcet  30 mg Oral Q supper  . darbepoetin (ARANESP) injection - DIALYSIS  200 mcg Intravenous Q Fri-HD  . feeding supplement  1 Container Oral TID BM  . levothyroxine  300 mcg Oral QAC breakfast  . midodrine  10 mg Oral TID WC  . pantoprazole  40 mg Oral BID AC  . sucralfate  1 g Oral BID  . tadalafil (PAH)  20 mg Oral Daily

## 2017-09-25 NOTE — Progress Notes (Signed)
Staples and wound vac on LUA were removed per MD order. Patient tolerated well. Applied Xeroform and kerlex per [place] nurse. Will continue to monitor.

## 2017-09-25 NOTE — Progress Notes (Signed)
Family Medicine Teaching Service Daily Progress Note Intern Pager: [phone]  Patient name: Adam [name]. Medical record number: [id number] Date of birth: 1960-09-14 Age: 57 y.o. Gender: male  Primary Care Provider: [name], MD Consultants: cardiology, nephrology, GI, vascular Code Status: full  Pt Overview and Major Events to Date:  5/16 admitted 5/16 transfuse 1u RBC  Assessment and Plan: Adam [name]. is a 57 y.o. male presenting with near syncope . PMH is significant for recent afib, ESRD on HD, pulmonary HTN, non ischemic cardiomyopathy, hypothyroidism, HLD, AV fistula removal, recent tarry diarrhea.  Near Syncope, acute: no repetition during admission, likely related to vasovagal vs anemia vs arrhythmia Reports that patient had tremors/rigors described as seizure like activity but no hx. Patient also has hx of Afib with recent ICD removal in setting of sepsis, now on amiodarone and coumadin.   No chest pain, neg ECG and trop of 0.04x2 make ACS unlikely.  CT neg for head bleed, no recent trauma make hemmorhagic stroke unlikely.   -continuous cardiac monitoring -cardiology consutled, no further cards workup required -continue home amiodarone -orthostatic vitals -additional troponin @6am  -manage anemia as below -considered but seizure is not top of differential -patient instructed to wear life vest until outpatient followup with Dr. Lovena Le.  ESRD on HD (MWF) via permcath:  Last HD 5/15.  Hx of having AV fistula removed recently in setting of sepsis, now with permcath.   K 3.4 and patient in line with chronic hypotension -call nephro in AM to notify for HD (non-emergent) -hold all calcium/phosphate binders, nephro may order -cont cefazolin with HD through 5/20  Symptomatic Anemia, subacute  Hgb 7.6, recent hx of tarry diarrhea since started abx for recent sepsis, C.Diff neg. Patient recently was supratherapeutic with his INR and only very recently has  returned within normal range. Suspect GI bleed. Scheduled to have colonoscopy. No history of hematemesis.  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  FOBt pos.  S/p 1u trasnfusion RBC -trend cbc -GI consulted, rec appreciated -currently NPO pending EGD 5/17 -starting pantoprazole 40mg   Atrial fibrillation, chronic Patient was recently seen by cardiology after he had ICD removed in the setting of an infection. Atriasl fibrillation though to be secondary to right disease in the setting of severe pulmonary HTN. No recent episode since his move to [place]. Currently being reevaluated for need for defibrillator. --holding warfarin pending bleed stabilization --continue amiodarone per cardiology  PAH, chronic, severe PA pressure of 59 per last echo in 4/19. Patient hypotensive on admission 88/61 after earlier dialysis session. Given episode of near syncope and hypotension, opted to change PAH med regimen -continue tadalfil -continue home ambresartan  Bilateral nodule on groin concern for aneurysm as he did have femcath access on one side, no erythema/drainage.  He says they are improving in size over the last few days since he noticed them.  US shows right sided thrombosed, left side w/ hematoma -vascular consulted  AV-fistula removal/wound w/ wound vac- -wound care signed off  Hypotension- chronic -restart home midodrine  Hypothyroidism S/p thyroid cancer on levothyroxine --Continue home dose   HLD Continue atorvastatin  FEN/GI: renal diet w/ fluid restriction, pantoprazole Prophylaxis: SCDs (holding coumadin in light of anemia and likely bleed)  Disposition: to tele, will need outpatient PT/tub bench  Subjective:  Feeling well, no repeated incidents.  Has life vest at home and will wear it when he gets d/c'd  Objective: Temp:  [97.7 F (36.5 C)-98.3 F (  36.8 C)] 97.7 F (36.5 C) (05/17 0424) Pulse Rate:  [63-81] 63 (05/17  0424) Resp:  [10-18] 18 (05/17 0424) BP: (96-117)/(70-85) 107/80 (05/17 0424) SpO2:  [95 %-100 %] 99 % (05/17 0424) Physical Exam: General: NAD, pleasant Eyes:  no conjunctival pallor or injection, arcus senilus ENTM: Moist mucous membranes Cardiovascular: RRR, 3/6 murmur, no LE edema, groin/femoral lesions unchanged with no new bruising/erythema, clean dressing on wound on left arm from fistula removal Respiratory: CTA BL, normal work of breathing Gastrointestinal: soft, nontender, nondistended, normoactive BS MSK: moves 4 extremities equally Derm: no rashes appreciated Neuro: CN II-XII grossly intact Psych: AOx3, appropriate affect   Laboratory: Recent Labs  Lab 09/23/17 2350 09/24/17 0757 09/24/17 1836  WBC 5.3 5.3 4.7  HGB 6.6* 7.7* 7.6*  HCT 21.6* 25.0* 25.0*  PLT 84* 89* 84*   Recent Labs  Lab 09/23/17 2350 09/24/17 0757 09/24/17 1836  NA 136 137 136  K 3.4* 5.1 3.5  CL 94* 96* 95*  CO2 28 26 28   BUN 12 15 16   CREATININE 4.63* 4.87* 5.40*  CALCIUM 8.2* 8.2* 8.3*  PROT 7.0  --   --   BILITOT 0.5  --   --   ALKPHOS 107  --   --   ALT <5*  --   --   AST 18  --   --   GLUCOSE 86 77 82     Imaging/Diagnostic Tests: Dg Chest 2 View  Result Date: 09/24/2017 CLINICAL DATA:  Acute onset of syncope. EXAM: CHEST - 2 VIEW COMPARISON:  Chest radiograph performed 09/01/2017 FINDINGS: The lungs are well-aerated. Vascular congestion is noted. There is no evidence of focal opacification, pleural effusion or pneumothorax. The heart is mildly enlarged. No acute osseous abnormalities are seen. A left-sided dual-lumen catheter is noted ending about the cavoatrial junction. IMPRESSION: Vascular congestion and mild cardiomegaly; lungs remain grossly clear. Electronically Signed   By: Garald Balding M.D.   On: 09/24/2017 01:02   Ct Head Wo Contrast  Result Date: 09/24/2017 CLINICAL DATA:  57 y/o  M; syncope with seizure-like activity. EXAM: CT HEAD WITHOUT CONTRAST TECHNIQUE:  Contiguous axial images were obtained from the base of the skull through the vertex without intravenous contrast. COMPARISON:  04/14/2016 sinus CT. FINDINGS: Brain: No evidence of acute infarction, hemorrhage, hydrocephalus, extra-axial collection or mass lesion/mass effect. Vascular: Extensive calcification of carotid siphons, vertebral arteries, in median vessels of scalp and visible face. Skull: Normal. Negative for fracture or focal lesion. Sinuses/Orbits: Near absence of bony structures of the nasal passages including nasal septum and turbinates. Near complete absence of ethmoid air cells and large defect within the left maxillary sinus medial wall. Apparent bony dehiscence of the right lamina papyracea and of the right-greater-than-left olfactory grooves. Moderate mucosal thickening of the walls of the open nasal cavity and sphenoid sinuses with opacification of the right maxillary sinus. Chronic inflammatory changes of the walls of the sinuses. Findings are similar to prior sinus CT given differences in technique. Normal aeration of mastoid air cells.  Orbits are unremarkable. Other: None. IMPRESSION: 1. No acute intracranial abnormality. 2. 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 Extensive paranasal sinus disease of the residual sinuses. Findings are similar to prior CT sinus. Electronically Signed   By: Kristine Garbe M.D.   On: 09/24/2017 00:47     Sherene Sires, DO 09/25/2017, 6:57 AM PGY-1, [facility] Intern pager: [phone], text pages welcome

## 2017-09-25 NOTE — Care Management Obs Status (Signed)
[facility] NOTIFICATION   Patient Details  Name: Adam [name]. MRN: [id number] Date of Birth: [birth year]   Medicare Observation Status Notification Given:  Yes    Sharin Mons, RN 09/25/2017, 4:58 PM

## 2017-09-26 DIAGNOSIS — T8130XA Disruption of wound, unspecified, initial encounter: Secondary | ICD-10-CM | POA: Diagnosis not present

## 2017-09-26 DIAGNOSIS — N186 End stage renal disease: Secondary | ICD-10-CM | POA: Diagnosis not present

## 2017-09-26 DIAGNOSIS — T8189XA Other complications of procedures, not elsewhere classified, initial encounter: Secondary | ICD-10-CM | POA: Diagnosis not present

## 2017-09-26 DIAGNOSIS — D631 Anemia in chronic kidney disease: Secondary | ICD-10-CM | POA: Diagnosis not present

## 2017-09-26 DIAGNOSIS — Z992 Dependence on renal dialysis: Secondary | ICD-10-CM | POA: Diagnosis not present

## 2017-09-26 LAB — CBC
HCT: 27.9 % — ABNORMAL LOW (ref 39.0–52.0)
HEMOGLOBIN: 8.5 g/dL — AB (ref 13.0–17.0)
MCH: 27.3 pg (ref 26.0–34.0)
MCHC: 30.5 g/dL (ref 30.0–36.0)
MCV: 89.7 fL (ref 78.0–100.0)
Platelets: 81 10*3/uL — ABNORMAL LOW (ref 150–400)
RBC: 3.11 MIL/uL — AB (ref 4.22–5.81)
RDW: 18 % — ABNORMAL HIGH (ref 11.5–15.5)
WBC: 3.9 10*3/uL — ABNORMAL LOW (ref 4.0–10.5)

## 2017-09-26 LAB — TYPE AND SCREEN
ABO/RH(D): O POS
ANTIBODY SCREEN: NEGATIVE
UNIT DIVISION: 0
Unit division: 0

## 2017-09-26 LAB — BASIC METABOLIC PANEL
ANION GAP: 12 (ref 5–15)
BUN: 12 mg/dL (ref 6–20)
CO2: 27 mmol/L (ref 22–32)
Calcium: 8.3 mg/dL — ABNORMAL LOW (ref 8.9–10.3)
Chloride: 100 mmol/L — ABNORMAL LOW (ref 101–111)
Creatinine, Ser: 4.61 mg/dL — ABNORMAL HIGH (ref 0.61–1.24)
GFR calc non Af Amer: 13 mL/min — ABNORMAL LOW (ref >60)
GFR, EST AFRICAN AMERICAN: 15 mL/min — AB (ref >60)
Glucose, Bld: 62 mg/dL — ABNORMAL LOW (ref 65–99)
POTASSIUM: 3.7 mmol/L (ref 3.5–5.1)
SODIUM: 139 mmol/L (ref 135–145)

## 2017-09-26 LAB — BPAM RBC
Blood Product Expiration Date: 201905192359
Blood Product Expiration Date: 201906132359
ISSUE DATE / TIME: 201905160339
ISSUE DATE / TIME: 201905172309
UNIT TYPE AND RH: 5100
Unit Type and Rh: 5100

## 2017-09-26 LAB — PROTIME-INR
INR: 1.25
PROTHROMBIN TIME: 15.6 s — AB (ref 11.4–15.2)

## 2017-09-26 MED ORDER — RENA-VITE PO TABS: 1.0000 | ORAL_TABLET | Freq: Every day | ORAL | 0 refills | Status: DC

## 2017-09-26 MED ORDER — LEVOTHYROXINE SODIUM 300 MCG PO TABS: 300.0000 ug | ORAL_TABLET | Freq: Every day | ORAL | 3 refills | Status: AC

## 2017-09-26 MED ORDER — WARFARIN - PHARMACIST DOSING INPATIENT: Freq: Every day | Status: DC

## 2017-09-26 MED ORDER — ALBUMIN HUMAN 25 % IV SOLN
25.0000 g | Freq: Once | INTRAVENOUS | Status: AC
  Administered 2017-09-25: 25 g via INTRAVENOUS

## 2017-09-26 MED ORDER — PANTOPRAZOLE SODIUM 40 MG PO TBEC: 40.0000 mg | DELAYED_RELEASE_TABLET | Freq: Two times a day (BID) | ORAL | 0 refills | Status: DC

## 2017-09-26 MED ORDER — CINACALCET HCL 30 MG PO TABS: 90.0000 mg | ORAL_TABLET | Freq: Every evening | ORAL | 0 refills | Status: DC

## 2017-09-26 MED ORDER — WARFARIN SODIUM 6 MG PO TABS
6.0000 mg | ORAL_TABLET | Freq: Once | ORAL | Status: DC
  Filled 2017-09-26: qty 1

## 2017-09-26 MED ORDER — SUCRALFATE 1 G PO TABS: 1.0000 g | ORAL_TABLET | Freq: Two times a day (BID) | ORAL | 0 refills | Status: DC

## 2017-09-26 MED ORDER — CALCIUM ACETATE (PHOS BINDER) 667 MG PO CAPS: 1334.0000 mg | ORAL_CAPSULE | Freq: Three times a day (TID) | ORAL | 0 refills | Status: DC

## 2017-09-26 NOTE — Progress Notes (Signed)
[facility] Progress Note   Dialysis Orders: [facility] MWF 4 hrs 180 NRe 400/800 124.5 kg 2.0 K/ 2.25 Ca  -No Heparin -Mircera 225 mcg IV q 2 weeks (last dose 09/11/2017 Last HGB 7.7 09/16/2017)  Assessment/Plan: 1. Presyncope/possible vasovagal event from excessive coughing. CT of head neg. No prior history of seizure activity. Per primary- Low hgb very well could have been contributory 2. Possible GIB-FOBT positive. HGB 6.6 on adm. Rec'd 1 unit PRBCs now HGB 7.3 Has had issues with anemia since 08/31/2017. Upper endo today  grade C esophagitis/some esoph ulcers, bx done with sever diffuse mucosal changes - rec PPI bid x 2 months, sucralfate bid x 2 months - need to watch alum level - outpatient colonoscopy for screening. no heparin HD for now due to GIB/low plts 3. Chronic Afib-ACID removed. Has had issues with elevated INR while on ABX for MSSA infection. Per primary. On amiodarone.  4. Recent MSSA sepsis- 4/19, ICD removed and infected fistula abscess I&D'd. Has continued on cefazolin 2 grams IV q treatment for a total of 12 doses, last dose due 09/28/2017. VAC on left arm wound 5. ESRD -  MWF via TDC. Had HD yesterday w 4 L off and at dry wt today 6. Hypertension/volume  - low bp's on Midodrine for BP support 7. Anemia  - See # 2.ive Aranesp 200 mcg IV with HD 5/17 Follow HGB.  8. Metabolic bone disease - Ca ok  Phos 2.7 decreased binder dose and continue. Changed senispar to 30 mg QOD.  9. Nutrition - Albumin 3.2. Renal diet, nepro, renal vit 10. H/O pulmonary HTN on ambrisentan and Tadalafil.  11. Thrombocytopenia - plts 89 > 79  - follow (128 5/8) 12. Dispo - ok for dc from renal standpoint    Kelly Splinter MD [facility] pager [phone]   09/26/2017, 12:54 PM    Subjective:   No c/o  Objective Vitals:   09/26/17 0225 09/26/17 0232 09/26/17 0246 09/26/17 0511  BP: 100/75 103/82 93/75 117/87  Pulse: 70 (!) 54 64 68  Resp: 17 17 17 18   Temp:   97.7 F (36.5 C) 97.8 F (36.6 C) 97.7 F (36.5 C)  TempSrc:  Oral Oral Oral  SpO2: 100% 100% 100% 100%  Weight:  124 kg (273 lb 5.9 oz)    Height:       Physical Exam General: NAD sitting on side of bed post endo Heart: RRR 2/6 murmur Lungs: no overt rales Abdomen: obese soft NT + BS Extremities: tr LE edema, VAC on left upper arm Dialysis Access: left IJ [facility]   Additional Objective Labs: Basic Metabolic Panel: Recent Labs  Lab 09/24/17 0757 09/24/17 1836 09/25/17 0814 09/26/17 0526  NA 137 136 136 139  K 5.1 3.5 3.4* 3.7  CL 96* 95* 94* 100*  CO2 26 28 28 27   GLUCOSE 77 82 69 62*  BUN 15 16 18 12   CREATININE 4.87* 5.40* 6.16* 4.61*  CALCIUM 8.2* 8.3* 8.2* 8.3*  PHOS 2.9 2.7  --   --    Liver Function Tests: Recent Labs  Lab 09/23/17 2350 09/24/17 0757 09/24/17 1836  AST 18  --   --   ALT <5*  --   --   ALKPHOS 107  --   --   BILITOT 0.5  --   --   PROT 7.0  --   --   ALBUMIN 2.9* 3.2* 3.2*   No results for input(s): LIPASE, AMYLASE in the last  168 hours. CBC: Recent Labs  Lab 09/23/17 2350 09/24/17 0757 09/24/17 1836 09/25/17 0814 09/26/17 0526  WBC 5.3 5.3 4.7 4.1 3.9*  NEUTROABS 4.7  --   --   --   --   HGB 6.6* 7.7* 7.6* 7.3* 8.5*  HCT 21.6* 25.0* 25.0* 24.0* 27.9*  MCV 90.8 90.3 91.6 89.6 89.7  PLT 84* 89* 84* 78* 81*   Blood Culture    Component Value Date/Time   SDES WOUND UPPER ARM PROXIMAL FISTULA 08/24/2017 1412   SPECREQUEST SPEC B ON SWABS 08/24/2017 1412   CULT  08/24/2017 1412    FEW STAPHYLOCOCCUS AUREUS NO ANAEROBES ISOLATED Performed at [facility], [place] [address], [place], [place] [address]    REPTSTATUS 08/29/2017 FINAL 08/24/2017 1412    Cardiac Enzymes: Recent Labs  Lab 09/23/17 2350 09/24/17 0757  TROPONINI 0.04* 0.04*   CBG: Recent Labs  Lab 09/23/17 2323  GLUCAP 90   Iron Studies: No results for input(s): IRON, TIBC, TRANSFERRIN, FERRITIN in the last 72 hours. Lab Results  Component Value  Date   INR 1.25 09/26/2017   INR 1.53 09/25/2017   INR 2.39 09/23/2017   Studies/Results: No results found. Medications: . sodium chloride    . sodium chloride    . sodium chloride    .  ceFAZolin (ANCEF) IV Stopped (09/25/17 1637)   . ambrisentan  10 mg Oral Daily  . amiodarone  200 mg Oral QPM  . atorvastatin  40 mg Oral QPM  . calcium acetate  1,334 mg Oral TID WC  . cinacalcet  30 mg Oral Q supper  . darbepoetin (ARANESP) injection - DIALYSIS  200 mcg Intravenous Q Fri-HD  . feeding supplement  1 Container Oral TID BM  . levothyroxine  300 mcg Oral QAC breakfast  . midodrine  10 mg Oral TID WC  . multivitamin  1 tablet Oral QHS  . pantoprazole  40 mg Oral BID AC  . sucralfate  1 g Oral BID  . tadalafil (PAH)  20 mg Oral Daily  . warfarin  6 mg Oral ONCE-1800  . Warfarin - Pharmacist Dosing Inpatient   Does not apply [phone]

## 2017-09-26 NOTE — Progress Notes (Signed)
HD tx completed @ 0225 w/bp issues that resolved w/ albumin admin, received 1 unit PRBC w/o s/s of reaction, UF goal met, blood rinsed back, report called to [place], R N

## 2017-09-26 NOTE — Discharge Instructions (Signed)
Take Coumadin 6 mg daily and have your INR checked in a few days at your hospital follow up.   Please follow up with GI for your outpatient colonoscopy.

## 2017-09-26 NOTE — Progress Notes (Signed)
HD tx initiated via HD cath w/o problem, pull/push/flush equally w/o problem, VSS w/ soft bp, Dr. Moshe Cipro called and made aware of soft bp on pre tx assess, orders received, pt is also to receive one unit of PRBC, will cont to monitor while on HD tx

## 2017-09-26 NOTE — Progress Notes (Signed)
ANTICOAGULATION CONSULT NOTE - Follow Up Consult  Pharmacy Consult for Warfarin Indication: atrial fibrillation  Allergies  Allergen Reactions  . Enalaprilat Swelling    Mouth swelling  . Vasotec [Enalapril] Swelling    MOUTH SWELLING  . Iodinated Diagnostic Agents Nausea And Vomiting and Nausea Only    Patient Measurements: Height: 6\' 1"  (185.4 cm) Weight: 273 lb 5.9 oz (124 kg) IBW/kg (Calculated) : 79.9  Vital Signs: Temp: 97.7 F (36.5 C) (05/18 0511) Temp Source: Oral (05/18 0511) BP: 117/87 (05/18 0511) Pulse Rate: 68 (05/18 0511)  Labs: Recent Labs    09/23/17 2350 09/24/17 0757 09/24/17 1836 09/25/17 4536 09/25/17 0814 09/26/17 0526 09/26/17 1034  HGB 6.6* 7.7* 7.6*  --  7.3* 8.5*  --   HCT 21.6* 25.0* 25.0*  --  24.0* 27.9*  --   PLT 84* 89* 84*  --  78* 81*  --   LABPROT 25.9*  --   --  18.3*  --   --  15.6*  INR 2.39  --   --  1.53  --   --  1.25  CREATININE 4.63* 4.87* 5.40*  --  6.16* 4.61*  --   TROPONINI 0.04* 0.04*  --   --   --   --   --     Estimated Creatinine Clearance: 25.3 mL/min (A) (by C-G formula based on SCr of 4.61 mg/dL (H)).   Assessment: 57 year old male to resume warfarin today post EGD for Afib Dose prior to admission = 3 mg po daily, recent adjustment 5/13 PTA Received 10 mg of IV vitamin K 5/16  Goal of Therapy:  INR 2-3 Monitor platelets by anticoagulation protocol: Yes   Plan:  Warfarin 6 mg po x 1 tonight Daily INR  Thank you Anette Guarneri, PharmD [phone]  09/26/2017,11:39 AM

## 2017-09-26 NOTE — Care Management (Signed)
[facility] liaison, Jermaine, notified of patient's d/c to resume [facility] services.

## 2017-09-26 NOTE — Progress Notes (Signed)
Family Medicine Teaching Service Daily Progress Note Intern Pager: [phone]  Patient name: Adam [name]. Medical record number: [id number] Date of birth: 12/24/1960 Age: 57 y.o. Gender: male  Primary Care Provider: [name], MD Consultants: cardiology, nephrology, GI, vascular Code Status: full  Pt Overview and Major Events to Date:  5/16 admitted 5/16 transfuse 1u RBC  Assessment and Plan: Adam [name]. is a 57 y.o. male presenting with near syncope . PMH is significant for recent afib, ESRD on HD, pulmonary HTN, non ischemic cardiomyopathy, hypothyroidism, HLD, AV fistula removal, recent tarry diarrhea.  Near Syncope, acute: no repetition during admission, likely related to vasovagal vs anemia vs arrhythmia Reports that patient had tremors/rigors described as seizure like activity but no hx. Patient also has hx of Afib with recent ICD removal in setting of sepsis, now on amiodarone and coumadin.   No chest pain, neg ECG and trop of 0.04x2 make ACS unlikely.  CT neg for head bleed, no recent trauma make hemmorhagic stroke unlikely.   -continuous cardiac monitoring -cardiology consutled, no further cards workup required -continue home amiodarone -orthostatic vitals -manage anemia as below -patient instructed to wear life vest until outpatient followup with Dr. Lovena Le.  Symptomatic Anemia, subacute  Hgb now 8.5 s/p transfusion 1uPRBC with HD (now 2u total during hospitalization). Recent hx of tarry diarrhea since started abx for recent sepsis, C.Diff neg. Patient recently was supratherapeutic with his INR and only very recently has returned within normal range. Suspect GI bleed. FOBT+. EGD performed which showed esophagitis and esophageal ulcers. Diffuse mucosal changes with erythema, erosion, hemorrhagic appearance and petechiae were noted throughout the stomach. 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   -trend cbc; transfuse  if Hgb <7 -GI consulted, rec appreciated  -PPI BID x2 months, sucralfate 1g BID x2 months  -awaiting biopsy results from EGD  -plan for colonoscopy outpatient  -resume Coumadin this AM per pharmacy dosing   ESRD on HD (MWF) via permcath:  Last HD 5/17.  Hx of having AV fistula removed recently in setting of sepsis, now with permcath.   K 3.7. Chronic hypotension.  -nephro following for HD  -cont cefazolin with HD through 5/20   Atrial fibrillation, chronic Patient was recently seen by cardiology after he had ICD removed in the setting of an infection. Atrial fibrillation though to be secondary to right heart disease in the setting of severe pulmonary HTN. No recent episode since his move to [place]. Currently being reevaluated for need for defibrillator. --continue amiodarone per cardiology -resume Coumadin today   PAH, chronic, severe PA pressure of 59 per last echo in 4/19. Patient hypotensive on admission 88/61 after earlier dialysis session. Given episode of near syncope and hypotension, opted to change PAH med regimen -continue tadalfil -continue home ambresartan  Bilateral nodule on groin concern for aneurysm as he did have femcath access on one side, no erythema/drainage.  He says they are improving in size over the last few days since he noticed them.  US shows right sided thrombosed, left side w/ hematoma -vascular consulted; do not recommend any f/u   AV-fistula removal/wound w/ wound vac- -wound care signed off  Hypotension- chronic -home midodrine  Hypothyroidism S/p thyroid cancer on levothyroxine --Continue home dose   HLD Continue atorvastatin  FEN/GI: renal diet w/ fluid restriction, pantoprazole Prophylaxis: Coumadin   Disposition: to tele, will need outpatient PT/tub bench  Subjective:  Feeling well. Would like to be discharged home today.  Objective: Temp:  [97.6 F (36.4 C)-97.9 F (36.6 C)] 97.7 F (36.5 C) (05/18 0511) Pulse Rate:   [54-109] 68 (05/18 0511) Resp:  [14-26] 18 (05/18 0511) BP: (81-117)/(56-87) 117/87 (05/18 0511) SpO2:  [94 %-100 %] 100 % (05/18 0511) Weight:  [270 lb (122.5 kg)-282 lb 6.6 oz (128.1 kg)] 273 lb 5.9 oz (124 kg) (05/18 0232) Physical Exam: General: NAD, pleasant Eyes:  no conjunctival pallor or injection, arcus senilus ENTM: Moist mucous membranes Cardiovascular: RRR, 3/6 murmur, no LE edema, clean dressing on wound on left arm from fistula removal Respiratory: CTA BL, normal work of breathing Gastrointestinal: soft, nontender, nondistended, normoactive BS MSK: moves 4 extremities equally Derm: no rashes appreciated Neuro: CN II-XII grossly intact Psych: AOx3, appropriate affect   Laboratory: Recent Labs  Lab 09/24/17 1836 09/25/17 0814 09/26/17 0526  WBC 4.7 4.1 3.9*  HGB 7.6* 7.3* 8.5*  HCT 25.0* 24.0* 27.9*  PLT 84* 78* 81*   Recent Labs  Lab 09/23/17 2350  09/24/17 1836 09/25/17 0814 09/26/17 0526  NA 136   < > 136 136 139  K 3.4*   < > 3.5 3.4* 3.7  CL 94*   < > 95* 94* 100*  CO2 28   < > 28 28 27   BUN 12   < > 16 18 12   CREATININE 4.63*   < > 5.40* 6.16* 4.61*  CALCIUM 8.2*   < > 8.3* 8.2* 8.3*  PROT 7.0  --   --   --   --   BILITOT 0.5  --   --   --   --   ALKPHOS 107  --   --   --   --   ALT <5*  --   --   --   --   AST 18  --   --   --   --   GLUCOSE 86   < > 82 69 62*   < > = values in this interval not displayed.     Imaging/Diagnostic Tests: Dg Chest 2 View  Result Date: 09/24/2017 CLINICAL DATA:  Acute onset of syncope. EXAM: CHEST - 2 VIEW COMPARISON:  Chest radiograph performed 09/01/2017 FINDINGS: The lungs are well-aerated. Vascular congestion is noted. There is no evidence of focal opacification, pleural effusion or pneumothorax. The heart is mildly enlarged. No acute osseous abnormalities are seen. A left-sided dual-lumen catheter is noted ending about the cavoatrial junction. IMPRESSION: Vascular congestion and mild cardiomegaly; lungs  remain grossly clear. Electronically Signed   By: Garald Balding M.D.   On: 09/24/2017 01:02   Ct Head Wo Contrast  Result Date: 09/24/2017 CLINICAL DATA:  57 y/o  M; syncope with seizure-like activity. EXAM: CT HEAD WITHOUT CONTRAST TECHNIQUE: Contiguous axial images were obtained from the base of the skull through the vertex without intravenous contrast. COMPARISON:  04/14/2016 sinus CT. FINDINGS: Brain: No evidence of acute infarction, hemorrhage, hydrocephalus, extra-axial collection or mass lesion/mass effect. Vascular: Extensive calcification of carotid siphons, vertebral arteries, in median vessels of scalp and visible face. Skull: Normal. Negative for fracture or focal lesion. Sinuses/Orbits: Near absence of bony structures of the nasal passages including nasal septum and turbinates. Near complete absence of ethmoid air cells and large defect within the left maxillary sinus medial wall. Apparent bony dehiscence of the right lamina papyracea and of the right-greater-than-left olfactory grooves. Moderate mucosal thickening of the walls of the open nasal cavity and sphenoid sinuses with opacification of the right maxillary sinus. Chronic inflammatory changes  of the walls of the sinuses. Findings are similar to prior sinus CT given differences in technique. Normal aeration of mastoid air cells.  Orbits are unremarkable. Other: None. IMPRESSION: 1. No acute intracranial abnormality. 2. 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 Extensive paranasal sinus disease of the residual sinuses. Findings are similar to prior CT sinus. Electronically Signed   By: Kristine Garbe M.D.   On: 09/24/2017 00:47     Nicolette Bang, DO 09/26/2017, 8:54 AM PGY-3, [place] Intern pager: [phone], text pages welcome

## 2017-09-26 NOTE — Progress Notes (Signed)
Nsg Discharge Note  Admit Date:  09/23/2017 Discharge date: 09/26/2017   Bennye Alm. to be D/C'd Home per MD order.  AVS completed.  Copy for chart, and copy for patient signed, and dated. Patient/caregiver able to verbalize understanding.  Discharge Medication: Allergies as of 09/26/2017      Reactions   Enalaprilat Swelling   Mouth swelling   Vasotec [enalapril] Swelling   MOUTH SWELLING   Iodinated Diagnostic Agents Nausea And Vomiting, Nausea Only      Medication List    TAKE these medications   ambrisentan 10 MG tablet Commonly known as:  LETAIRIS Take 1 tablet (10 mg total) by mouth daily.   amiodarone 200 MG tablet Commonly known as:  PACERONE Take 200 mg by mouth every evening.   atorvastatin 40 MG tablet Commonly known as:  LIPITOR Take 1 tablet (40 mg total) by mouth every evening.   calcium acetate 667 MG capsule Commonly known as:  PHOSLO Take 2 capsules (1,334 mg total) by mouth 3 (three) times daily with meals.   ceFAZolin IVPB Commonly known as:  ANCEF Inject 2 g into the vein every dialysis (GIVE AT END OF EACH HD SESSION (MWF schedule)). Indication: MSSA Bacteremia Last Day of Therapy: 10/05/17 Labs - Once weekly:  CBC/D and BMP, Labs - Every other week:  ESR and CRP   cinacalcet 30 MG tablet Commonly known as:  SENSIPAR Take 3 tablets (90 mg total) by mouth every evening. What changed:  medication strength   levothyroxine 300 MCG tablet Commonly known as:  SYNTHROID, LEVOTHROID Take 1 tablet (300 mcg total) by mouth daily before breakfast. What changed:  when to take this   midodrine 10 MG tablet Commonly known as:  PROAMATINE Take 1 tablet (10 mg total) by mouth 3 (three) times daily.   multivitamin Tabs tablet Take 1 tablet by mouth at bedtime.   pantoprazole 40 MG tablet Commonly known as:  PROTONIX Take 1 tablet (40 mg total) by mouth 2 (two) times daily before a meal.   sucralfate 1 g tablet Commonly known as:   CARAFATE Take 1 tablet (1 g total) by mouth 2 (two) times daily.   tadalafil (PAH) 20 MG tablet Commonly known as:  ADCIRCA 1 tab daily What changed:    how much to take  how to take this  when to take this  additional instructions   warfarin 6 MG tablet Commonly known as:  COUMADIN Take as directed. If you are unsure how to take this medication, talk to your nurse or doctor. Original instructions:  Take 1 tablet (6 mg total) by mouth one time only at 6 PM.       Discharge Assessment: Vitals:   09/26/17 0511 09/26/17 1256  BP: 117/87 100/76  Pulse: 68 72  Resp: 18 18  Temp: 97.7 F (36.5 C) 97.7 F (36.5 C)  SpO2: 100% 97%   Skin clean, dry and intact without evidence of skin break down, no evidence of skin tears noted. IV catheter discontinued intact. Site without signs and symptoms of complications - no redness or edema noted at insertion site, patient denies c/o pain - only slight tenderness at site.  Dressing with slight pressure applied.  D/c Instructions-Education: Discharge instructions given to patient/family with verbalized understanding. D/c education completed with patient/family including follow up instructions, medication list, d/c activities limitations if indicated, with other d/c instructions as indicated by MD - patient able to verbalize understanding, all questions fully answered. Patient instructed to  return to ED, call 911, or call MD for any changes in condition.  Patient escorted via [place], and D/C home via private auto.  Salley Slaughter, RN 09/26/2017 4:49 PM

## 2017-09-27 ENCOUNTER — Encounter (HOSPITAL_COMMUNITY): Payer: Self-pay | Admitting: Gastroenterology

## 2017-09-28 ENCOUNTER — Telehealth: Payer: Self-pay

## 2017-09-28 ENCOUNTER — Ambulatory Visit: Payer: Medicare HMO | Admitting: Family

## 2017-09-28 ENCOUNTER — Encounter: Payer: Self-pay | Admitting: Family

## 2017-09-28 VITALS — BP 112/78 | HR 80 | Temp 97.6°F | Ht 73.0 in | Wt 284.4 lb

## 2017-09-28 DIAGNOSIS — Z992 Dependence on renal dialysis: Secondary | ICD-10-CM

## 2017-09-28 DIAGNOSIS — B9561 Methicillin susceptible Staphylococcus aureus infection as the cause of diseases classified elsewhere: Secondary | ICD-10-CM

## 2017-09-28 DIAGNOSIS — N186 End stage renal disease: Secondary | ICD-10-CM | POA: Diagnosis not present

## 2017-09-28 DIAGNOSIS — R7881 Bacteremia: Secondary | ICD-10-CM

## 2017-09-28 NOTE — Telephone Encounter (Signed)
Received fax from [employer] requesting prior authorization of Cinacalcet. Form placed in PCP's box for completion. Danley Danker, RN [facility] RN)

## 2017-09-28 NOTE — Assessment & Plan Note (Signed)
Appears to have complete resolution with symptoms with removal of ICD and fistula most likely obtaining definitive cure. No systemic symptoms since completing antibiotics. Obtain blood cultures today to confirm resolution of bacteremia. No further antibiotics pending blood culture results. Follow-up as needed pending blood culture results.

## 2017-09-28 NOTE — Assessment & Plan Note (Signed)
Stable and maintained by nephrology. No further antibiotics needed at this time. Continue dialysis catheter with fistula placement per vascular surgery.

## 2017-09-28 NOTE — Progress Notes (Signed)
Subjective:    Patient ID: Adam Alm., male    DOB: 06-03-1960, 57 y.o.   MRN: [id number]  Chief Complaint  Patient presents with  . Bacteremia    HPI:  Adam Cueto. is a 57 y.o. male who presents today for an initial office visit following hospitalization.   Adam [name] was evaluated in the emergency department and admitted to the hospital with the chief complaint of fever and shortness of breath. Fevers reached as high as 102. He was found to have MSSA bacteremia with his fistula likely the source and concern for infection of the ICD. He was started on cefazolin. During hospitalization but the ICD and the fistula were removed. A temporary dialysis catheter was placed once blood cultures remain negative for 48 hours. He was discharged on Ancef during dialysis with an date of 09/28/17. He was subsequently readmitted to the hospital with a gastrointestinal bleed which was treated with a unit of packed red blood cells as well as a proton pump inhibitor. All hospital records, labs, and imaging reviewed in detail.   Since leaving the hospital he completed his final dose of Ancef on 09/25/17. No adverse side effects were noted. Reports his hemoglobin has improved as measured during dialysis today. No further bleeding noted. Currently awaiting new fistula placement and continues to have temporary dialysis catheter located in the upper left chest. Cardiology with no indication for replacement of the ICD presently. Denies fevers, chills, or night sweats.   Allergies  Allergen Reactions  . Enalaprilat Swelling    Mouth swelling  . Vasotec [Enalapril] Swelling    MOUTH SWELLING  . Iodinated Diagnostic Agents Nausea And Vomiting and Nausea Only      Outpatient Medications Prior to Visit  Medication Sig Dispense Refill  . ambrisentan (LETAIRIS) 10 MG tablet Take 1 tablet (10 mg total) by mouth daily. 30 tablet 11  . amiodarone (PACERONE) 200 MG tablet Take 200 mg by mouth every  evening.     Marland Kitchen atorvastatin (LIPITOR) 40 MG tablet Take 1 tablet (40 mg total) by mouth every evening. 30 tablet 2  . calcium acetate (PHOSLO) 667 MG capsule Take 2 capsules (1,334 mg total) by mouth 3 (three) times daily with meals. 30 capsule 0  . ceFAZolin (ANCEF) IVPB Inject 2 g into the vein every dialysis (GIVE AT END OF EACH HD SESSION (MWF schedule)). Indication: MSSA Bacteremia Last Day of Therapy: 10/05/17 Labs - Once weekly:  CBC/D and BMP, Labs - Every other week:  ESR and CRP 30 Units 0  . cinacalcet (SENSIPAR) 30 MG tablet Take 3 tablets (90 mg total) by mouth every evening. 30 tablet 0  . levothyroxine (SYNTHROID, LEVOTHROID) 300 MCG tablet Take 1 tablet (300 mcg total) by mouth daily before breakfast. 90 tablet 3  . midodrine (PROAMATINE) 10 MG tablet Take 1 tablet (10 mg total) by mouth 3 (three) times daily. 90 tablet 6  . multivitamin (RENA-VIT) TABS tablet Take 1 tablet by mouth at bedtime. 30 each 0  . pantoprazole (PROTONIX) 40 MG tablet Take 1 tablet (40 mg total) by mouth 2 (two) times daily before a meal. 60 tablet 0  . sucralfate (CARAFATE) 1 g tablet Take 1 tablet (1 g total) by mouth 2 (two) times daily. 60 tablet 0  . tadalafil, PAH, (ADCIRCA) 20 MG tablet 1 tab daily (Patient taking differently: Take 20 mg by mouth daily. ) 30 tablet 11  . warfarin (COUMADIN) 6 MG tablet Take 1 tablet (  6 mg total) by mouth one time only at 6 PM. (Patient not taking: Reported on 09/23/2017) 30 tablet 0   No facility-administered medications prior to visit.      Past Medical History:  Diagnosis Date  . AICD (automatic cardioverter/defibrillator) present    [employer]   . Aortic stenosis    moderate AS by 08/2016 echo  . Arthritis    "knees" (08/05/2016)  . Atrial fibrillation ([place])   . ESRD (end stage renal disease) on dialysis ([place])    "M, T, W, T, F; NX stage hemodialysis; I do it at home" (08/05/2016)  . Great toe amputation status ([place])    status post left hallux amputation  08/01/14  . History of blood transfusion 2009   "S/P biopsy for prostate cancer check"  . Hypertension   . Hypothyroidism (acquired)   . Mitral stenosis    moderate mitral stenosis  . Nonischemic cardiomyopathy ([place])   . Pneumonia 2014; 08/05/2016  . PONV (postoperative nausea and vomiting)   . Pulmonary HTN ([place])   . Renal insufficiency   . Thyroid cancer ([place]) 2011      Past Surgical History:  Procedure Laterality Date  . ARTERIAL LINE INSERTION Right 08/24/2017   Procedure: ARTERIAL LINE INSERTION INTO RIGHT FEMORAL ARTERY;  Surgeon: Conrad Burgin, MD;  Location: [place];  Service: Vascular;  Laterality: Right;  . BIOPSY  09/25/2017   Procedure: BIOPSY;  Surgeon: Ronnette Juniper, MD;  Location: [facility] ENDOSCOPY;  Service: Gastroenterology;;  . CARDIAC CATHETERIZATION Right 2017  . CARDIAC DEFIBRILLATOR PLACEMENT  2009  . DIALYSIS FISTULA CREATION Left 2003   "upper arm"  . DIALYSIS/PERMA CATHETER REMOVAL Left 09/01/2017   Procedure: DIALYSIS/PERMA CATHETER REMOVAL;  Surgeon: Conrad Level Green, MD;  Location: [place];  Service: Vascular;  Laterality: Left;  . ESOPHAGOGASTRODUODENOSCOPY (EGD) WITH PROPOFOL N/A 09/25/2017   Procedure: ESOPHAGOGASTRODUODENOSCOPY (EGD) WITH PROPOFOL;  Surgeon: Ronnette Juniper, MD;  Location: [place];  Service: Gastroenterology;  Laterality: N/A;  . HERNIA REPAIR    . ICD LEAD REMOVAL Right 08/20/2017   Procedure: ICD EXTRACTION WITH C-ARM;  Surgeon: Evans Lance, MD;  Location: [facility] OR;  Service: Cardiovascular;  Laterality: Right;  DR VAN TRIGT TO BACK-UP  . INSERTION OF DIALYSIS CATHETER N/A 11/11/2016   Procedure: INSERTION OF TUNNELED DIALYSIS CATHETER;  Surgeon: Angelia Mould, MD;  Location: [place];  Service: Vascular;  Laterality: N/A;  . INSERTION OF DIALYSIS CATHETER Left 08/24/2017   Procedure: INSERTION OF DIALYSIS CATHETER;  Surgeon: Conrad Watertown, MD;  Location: [place];  Service: Vascular;  Laterality: Left;  . INSERTION OF DIALYSIS CATHETER Left  09/01/2017   Procedure: INSERTION OF DIALYSIS CATHETER;  Surgeon: Conrad Baker, MD;  Location: [place];  Service: Vascular;  Laterality: Left;  . KIDNEY TRANSPLANT  2009  . KNEE SURGERY Bilateral 2000s   "drained fluid"  . LAPAROSCOPIC GASTRIC BANDING  2006  . PROSTATE BIOPSY  2009  . REVISON OF ARTERIOVENOUS FISTULA Left 08/28/3788   Procedure: PLICATION OF ARTERIOVENOUS FISTULA ANEURYSM;  Surgeon: Elam Dutch, MD;  Location: [place];  Service: Vascular;  Laterality: Left;  . REVISON OF ARTERIOVENOUS FISTULA Left 11/10/2016   Procedure: REVISON OF ARTERIOVENOUS FISTULA - LEFT UPPER ARM;  Surgeon: Elam Dutch, MD;  Location: [place];  Service: Vascular;  Laterality: Left;  . REVISON OF ARTERIOVENOUS FISTULA Left 08/24/2017   Procedure: REVISON OF ARTERIOVENOUS FISTULA LEFT;  Surgeon: Conrad , MD;  Location: [place];  Service:  Vascular;  Laterality: Left;  . TEE WITHOUT CARDIOVERSION N/A 08/20/2017   Procedure: TRANSESOPHAGEAL ECHOCARDIOGRAM (TEE);  Surgeon: Evans Lance, MD;  Location: [place];  Service: Cardiovascular;  Laterality: N/A;  . THROMBECTOMY W/ EMBOLECTOMY Left 12/25/2014   Procedure: THROMBECTOMY ARTERIOVENOUS FISTULA;  Surgeon: Elam Dutch, MD;  Location: [place];  Service: Vascular;  Laterality: Left;  . THYROIDECTOMY  2011  . TOE AMPUTATION Bilateral     right 1st and  2nd digits; left 1st and 3rd digits"  . UMBILICAL HERNIA REPAIR  X 2  . VEIN HARVEST Left 08/24/2017   Procedure: VEIN HARVEST;  Surgeon: Conrad Creston, MD;  Location[facility] OR;  Service: Vascular;  Laterality: Left;      Family History  Problem Relation Age of Onset  . Heart failure Mother   . Hypertension Mother   . CAD Mother 51  . Emphysema Mother   . Hypertension Father   . Kidney failure Father       Social History   Socioeconomic History  . Marital status: Married    Spouse name: Not on file  . Number of children: 1  . Years of education: Not on file  . Highest education level:  Not on file  Occupational History  . Not on file  Social Needs  . Financial resource strain: Not on file  . Food insecurity:    Worry: Not on file    Inability: Not on file  . Transportation needs:    Medical: Not on file    Non-medical: Not on file  Tobacco Use  . Smoking status: Never Smoker  . Smokeless tobacco: Never Used  Substance and Sexual Activity  . Alcohol use: Yes    Alcohol/week: 0.0 oz    Comment: 08/05/2016 "might have 1 shot/month of liquor"  . Drug use: No  . Sexual activity: Yes  Lifestyle  . Physical activity:    Days per week: Not on file    Minutes per session: Not on file  . Stress: Not on file  Relationships  . Social connections:    Talks on phone: Not on file    Gets together: Not on file    Attends religious service: Not on file    Active member of club or organization: Not on file    Attends meetings of clubs or organizations: Not on file    Relationship status: Not on file  . Intimate partner violence:    Fear of current or ex partner: Not on file    Emotionally abused: Not on file    Physically abused: Not on file    Forced sexual activity: Not on file  Other Topics Concern  . Not on file  Social History Narrative   Retired Archivist.        Review of Systems  Constitutional: Negative for chills, diaphoresis, fatigue and fever.  Respiratory: Negative for cough, chest tightness, shortness of breath and wheezing.   Cardiovascular: Negative for chest pain.  Gastrointestinal: Negative for abdominal distention, anal bleeding, blood in stool, constipation, diarrhea and nausea.  Skin: Negative for rash and wound.       Objective:    BP 112/78   Pulse 80   Temp 97.6 F (36.4 C)   Ht _0  (1.854 m)   Wt 284 lb 6.4 oz (129 kg)   SpO2 96%   BMI 37.52 kg/m  Nursing note and vital signs reviewed.  Physical Exam  Constitutional: He is oriented to  person, place, and time. He appears well-developed and well-nourished.  No distress.  HENT:  Mouth/Throat: Oropharynx is clear and moist.  Neck: Neck supple.  Cardiovascular: Normal rate, regular rhythm and intact distal pulses. Exam reveals no gallop and no friction rub.  Murmur heard. Pulmonary/Chest: Effort normal and breath sounds normal. No stridor. No respiratory distress. He has no wheezes. He has no rales. He exhibits no tenderness.  Abdominal: Soft. Bowel sounds are normal. He exhibits no distension. There is no tenderness.  Lymphadenopathy:    He has no cervical adenopathy.  Neurological: He is alert and oriented to person, place, and time.  Skin: Skin is warm and dry.  Psychiatric: He has a normal mood and affect. His behavior is normal. Judgment and thought content normal.        Assessment & Plan:   Problem List Items Addressed This Visit      Genitourinary   ESRD on hemodialysis ([place])    Stable and maintained by nephrology. No further antibiotics needed at this time. Continue dialysis catheter with fistula placement per vascular surgery.        Other   MSSA bacteremia - Primary    Appears to have complete resolution with symptoms with removal of ICD and fistula most likely obtaining definitive cure. No systemic symptoms since completing antibiotics. Obtain blood cultures today to confirm resolution of bacteremia. No further antibiotics pending blood culture results. Follow-up as needed pending blood culture results.      Relevant Orders   Culture, blood (single)   Culture, blood (single)       I am having Adam [name]. maintain his amiodarone, midodrine, tadalafil (PAH), atorvastatin, warfarin, ceFAZolin, ambrisentan, levothyroxine, cinacalcet, calcium acetate, multivitamin, pantoprazole, and sucralfate.   Follow-up: Return if symptoms worsen or fail to improve.   Mauricio Po, [place] [facility]

## 2017-09-28 NOTE — Patient Instructions (Addendum)
Nice to see you!  We will check you blood work today to ensure the infection is resolved.  Follow up depending upon blood work as needed.

## 2017-09-29 ENCOUNTER — Other Ambulatory Visit: Payer: Self-pay

## 2017-09-29 ENCOUNTER — Encounter: Payer: Self-pay | Admitting: Internal Medicine

## 2017-09-29 ENCOUNTER — Ambulatory Visit (INDEPENDENT_AMBULATORY_CARE_PROVIDER_SITE_OTHER): Payer: Medicare HMO | Admitting: Internal Medicine

## 2017-09-29 VITALS — BP 102/68 | HR 78 | Temp 97.5°F | Wt 288.0 lb

## 2017-09-29 DIAGNOSIS — R55 Syncope and collapse: Secondary | ICD-10-CM | POA: Diagnosis not present

## 2017-09-29 DIAGNOSIS — R5381 Other malaise: Secondary | ICD-10-CM | POA: Diagnosis not present

## 2017-09-29 DIAGNOSIS — R531 Weakness: Secondary | ICD-10-CM | POA: Diagnosis not present

## 2017-09-29 NOTE — Telephone Encounter (Signed)
Cinacalcet denied per CoverMyMeds. Copy of denial along with appeal phone number placed in PCP's box for review. Danley Danker, RN [facility] RN)

## 2017-09-29 NOTE — Patient Instructions (Addendum)
It was nice meeting you today Mr. Arizola!  I have placed a referral to physical therapy for you. They should call you with the date and time of your first session. If you do not hear from them within a few days, please call to let us know.   Please be sure to go to all of your follow up appointments as scheduled.   If you have any questions or concerns, please feel free to call the clinic.   Be well,  Dr. Avon Gully

## 2017-09-29 NOTE — Telephone Encounter (Signed)
PA completed via CoverMyMeds. Status pending. Will check status in 24 hours. Danley Danker, RN [facility] RN)

## 2017-09-29 NOTE — Progress Notes (Signed)
   Subjective:   Patient: Adam [name].       Birthdate: 1961/03/29       MRN: [id number]      HPI  Adam [name]. is a 57 y.o. male presenting for hospital f/u.   Patient admitted from 05/15-05/18 for near syncope. He had been admitted prior to that for MSSA bacteremia thought likely related to dialysis fistula. At that admission, fistula and ICD were removed. Patient was discharged, then returned with episode of near syncope. Was found to be anemic to 6.6, which improved to 7.7 with 1U PRBC. FOBT was positive, so workup for source of likely GI bleed was performed during admission. Endoscopy showed multiple significant hemorrhagic-appearing mucosal changes. Cardiology was also consulted as patient has recently had ICD removed, who recommended continuing to wear life vest until possible replacement of ICD discussed at outpatient appt. As patient's Hgb improved and his condition stabilized, he was felt appropriate for discharge with outpatient follow up with GI, vascular, infectious disease, and cardiology.  Since hospital discharge, patient reports doing very well. He has set up all of his follow up appointments, and is taking all of his medications as prescribed. Denies any additional episodes of lightheadedness or dizziness. Has not been wearing life vest, as he thought he was not supposed to wear it. Has not started PT which he says he was told he would be receiving to help regain his strength. Is requesting referral for PT today.   Patient has appt with GI in two days, with vascular in three days, and with cardiology in two weeks. Also has pulmonology appt soon.   Smoking status reviewed. Patient is never smoker.   Review of Systems See HPI.     Objective:  Physical Exam  Constitutional: He is oriented to person, place, and time. He appears well-developed and well-nourished. No distress.  HENT:  Head: Normocephalic and atraumatic.  Pulmonary/Chest: Effort normal. No  respiratory distress.  Neurological: He is alert and oriented to person, place, and time.  Psychiatric: He has a normal mood and affect. His behavior is normal.      Assessment & Plan:  Near syncope No additional episodes of lightheadedness or syncope. Doing very well since hospital discharge. Has followed up with all specialists as planned. Will place referral to PT today for deconditioning after prolonged hospitalization.    Adin Hector, MD, MPH PGY-3 [facility] Pager [phone]

## 2017-09-30 ENCOUNTER — Telehealth: Payer: Self-pay | Admitting: Vascular Surgery

## 2017-09-30 NOTE — Telephone Encounter (Signed)
-----   Message from Mena Goes, RN sent at 09/30/2017  9:53 AM EDT ----- Regarding: next available BLC and labs   ----- Message ----- From: Conrad Shorewood, MD Sent: 09/29/2017  11:18 PM To: Mena Goes, RN Subject: RE: what timeframe?                            Next available, no rush ----- Message ----- From: Mena Goes, RN Sent: 09/25/2017   3:11 PM To: Conrad Smithfield, MD Subject: what timeframe?                                What timeframe? ----- Message ----- From: Conrad Bonner, MD Sent: 09/25/2017   1:44 PM To: [facility] [address] Adam [name], Adam [name]  Needs R arm vein and arterial studies for access upon follow up

## 2017-09-30 NOTE — Telephone Encounter (Signed)
sch appt 8am LE Saphenous Mapping 830 am UE Art Duplex

## 2017-09-30 NOTE — Telephone Encounter (Signed)
-----   Message from Mena Goes, RN sent at 09/30/2017  9:53 AM EDT ----- Regarding: next available BLC and labs   ----- Message ----- From: Conrad [place], MD Sent: 09/29/2017  11:18 PM To: Mena Goes, RN Subject: RE: what timeframe?                            Next available, no rush ----- Message ----- From: Mena Goes, RN Sent: 09/25/2017   3:11 PM To: Conrad Bozeman, MD Subject: what timeframe?                                What timeframe? ----- Message ----- From: Conrad Nuckolls, MD Sent: 09/25/2017   1:44 PM To: [facility] [address] Adam [name]. [id number] 05/02/1961  Needs R arm vein and arterial studies for access upon follow up

## 2017-10-01 ENCOUNTER — Other Ambulatory Visit: Payer: Self-pay | Admitting: Vascular Surgery

## 2017-10-01 ENCOUNTER — Ambulatory Visit (HOSPITAL_COMMUNITY)
Admission: RE | Admit: 2017-10-01 | Discharge: 2017-10-01 | Disposition: A | Discharge status: Home / Self Care | Payer: Medicare HMO | Source: Ambulatory Visit | Attending: Vascular Surgery | Admitting: Vascular Surgery

## 2017-10-01 ENCOUNTER — Ambulatory Visit: Payer: Medicare HMO | Admitting: Acute Care

## 2017-10-01 ENCOUNTER — Encounter: Payer: Self-pay | Admitting: Internal Medicine

## 2017-10-01 ENCOUNTER — Ambulatory Visit (INDEPENDENT_AMBULATORY_CARE_PROVIDER_SITE_OTHER)
Admission: RE | Admit: 2017-10-01 | Discharge: 2017-10-01 | Disposition: A | Discharge status: Home / Self Care | Payer: Medicare HMO | Source: Ambulatory Visit | Attending: Vascular Surgery | Admitting: Vascular Surgery

## 2017-10-01 ENCOUNTER — Encounter: Payer: Self-pay | Admitting: Acute Care

## 2017-10-01 DIAGNOSIS — I82611 Acute embolism and thrombosis of superficial veins of right upper extremity: Secondary | ICD-10-CM | POA: Diagnosis not present

## 2017-10-01 DIAGNOSIS — N186 End stage renal disease: Secondary | ICD-10-CM | POA: Diagnosis not present

## 2017-10-01 DIAGNOSIS — I272 Pulmonary hypertension, unspecified: Secondary | ICD-10-CM

## 2017-10-01 NOTE — H&P (View-Only) (Signed)
Established Dialysis Access   History of Present Illness   Adam [name]. is a 57 y.o. (1960/12/26) male who presents for re-evaluation for permanent access.  The patient is right hand dominant.  Previous access procedures have been completed in the left arm.  The patient's complication from previous access procedures include: infection and near rupture.  The patient underwent I&D L arm abscess, exc of left brachiocephalic arteriovenous fistula, VPA L brachial artery and L fem TDC placement on 08/24/17.  The patient left the OR unstable and was left on the vent on vasopressors post-op.  The patient also had the L fem [place] changed to LIJV [place] on 09/01/17.  He patient returns today for evaluation for new permanent access as his L arm is nearly healed.  Past Medical History:  Diagnosis Date  . AICD (automatic cardioverter/defibrillator) present    Medtronic   . Aortic stenosis    moderate AS by 08/2016 echo  . Arthritis    "knees" (08/05/2016)  . Atrial fibrillation ([place])   . ESRD (end stage renal disease) on dialysis ([place])    "M, T, W, T, F; NX stage hemodialysis; I do it at home" (08/05/2016)  . Great toe amputation status ([place])    status post left hallux amputation 08/01/14  . History of blood transfusion 2009   "S/P biopsy for prostate cancer check"  . Hypertension   . Hypothyroidism (acquired)   . Mitral stenosis    moderate mitral stenosis  . Nonischemic cardiomyopathy ([place])   . Pneumonia 2014; 08/05/2016  . PONV (postoperative nausea and vomiting)   . Pulmonary HTN ([place])   . Renal insufficiency   . Thyroid cancer ([place]) 2011    Past Surgical History:  Procedure Laterality Date  . ARTERIAL LINE INSERTION Right 08/24/2017   Procedure: ARTERIAL LINE INSERTION INTO RIGHT FEMORAL ARTERY;  Surgeon: Conrad Petersburg, MD;  Location: [place];  Service: Vascular;  Laterality: Right;  . BIOPSY  09/25/2017   Procedure: BIOPSY;  Surgeon: Ronnette Juniper, MD;  Location: [facility] ENDOSCOPY;  Service:  Gastroenterology;;  . CARDIAC CATHETERIZATION Right 2017  . CARDIAC DEFIBRILLATOR PLACEMENT  2009  . DIALYSIS FISTULA CREATION Left 2003   "upper arm"  . DIALYSIS/PERMA CATHETER REMOVAL Left 09/01/2017   Procedure: DIALYSIS/PERMA CATHETER REMOVAL;  Surgeon: Conrad [place], MD;  Location: [place];  Service: Vascular;  Laterality: Left;  . ESOPHAGOGASTRODUODENOSCOPY (EGD) WITH PROPOFOL N/A 09/25/2017   Procedure: ESOPHAGOGASTRODUODENOSCOPY (EGD) WITH PROPOFOL;  Surgeon: Ronnette Juniper, MD;  Location: [place];  Service: Gastroenterology;  Laterality: N/A;  . HERNIA REPAIR    . ICD LEAD REMOVAL Right 08/20/2017   Procedure: ICD EXTRACTION WITH C-ARM;  Surgeon: Evans Lance, MD;  Location: [facility] OR;  Service: Cardiovascular;  Laterality: Right;  DR VAN TRIGT TO BACK-UP  . INSERTION OF DIALYSIS CATHETER N/A 11/11/2016   Procedure: INSERTION OF TUNNELED DIALYSIS CATHETER;  Surgeon: Angelia Mould, MD;  Location: [place];  Service: Vascular;  Laterality: N/A;  . INSERTION OF DIALYSIS CATHETER Left 08/24/2017   Procedure: INSERTION OF DIALYSIS CATHETER;  Surgeon: Conrad , MD;  Location: [place];  Service: Vascular;  Laterality: Left;  . INSERTION OF DIALYSIS CATHETER Left 09/01/2017   Procedure: INSERTION OF DIALYSIS CATHETER;  Surgeon: Conrad , MD;  Location: [place];  Service: Vascular;  Laterality: Left;  . KIDNEY TRANSPLANT  2009  . KNEE SURGERY Bilateral 2000s   "drained fluid"  . LAPAROSCOPIC GASTRIC BANDING  2006  .  PROSTATE BIOPSY  2009  . REVISON OF ARTERIOVENOUS FISTULA Left 0/62/6948   Procedure: PLICATION OF ARTERIOVENOUS FISTULA ANEURYSM;  Surgeon: Elam Dutch, MD;  Location: [place];  Service: Vascular;  Laterality: Left;  . REVISON OF ARTERIOVENOUS FISTULA Left 11/10/2016   Procedure: REVISON OF ARTERIOVENOUS FISTULA - LEFT UPPER ARM;  Surgeon: Elam Dutch, MD;  Location: [place];  Service: Vascular;  Laterality: Left;  . REVISON OF ARTERIOVENOUS FISTULA Left 08/24/2017    Procedure: REVISON OF ARTERIOVENOUS FISTULA LEFT;  Surgeon: Conrad Combine, MD;  Location: [place];  Service: Vascular;  Laterality: Left;  . TEE WITHOUT CARDIOVERSION N/A 08/20/2017   Procedure: TRANSESOPHAGEAL ECHOCARDIOGRAM (TEE);  Surgeon: Evans Lance, MD;  Location: [place];  Service: Cardiovascular;  Laterality: N/A;  . THROMBECTOMY W/ EMBOLECTOMY Left 12/25/2014   Procedure: THROMBECTOMY ARTERIOVENOUS FISTULA;  Surgeon: Elam Dutch, MD;  Location: [place];  Service: Vascular;  Laterality: Left;  . THYROIDECTOMY  2011  . TOE AMPUTATION Bilateral     right 1st and  2nd digits; left 1st and 3rd digits"  . UMBILICAL HERNIA REPAIR  X 2  . VEIN HARVEST Left 08/24/2017   Procedure: VEIN HARVEST;  Surgeon: Conrad [place], MD;  Location: [facility] OR;  Service: Vascular;  Laterality: Left;    Social History   Socioeconomic History  . Marital status: Married    Spouse name: Not on file  . Number of children: 1  . Years of education: Not on file  . Highest education level: Not on file  Occupational History  . Not on file  Social Needs  . Financial resource strain: Not on file  . Food insecurity:    Worry: Not on file    Inability: Not on file  . Transportation needs:    Medical: Not on file    Non-medical: Not on file  Tobacco Use  . Smoking status: Never Smoker  . Smokeless tobacco: Never Used  Substance and Sexual Activity  . Alcohol use: Yes    Alcohol/week: 0.0 oz    Comment: 08/05/2016 "might have 1 shot/month of liquor"  . Drug use: No  . Sexual activity: Yes  Lifestyle  . Physical activity:    Days per week: Not on file    Minutes per session: Not on file  . Stress: Not on file  Relationships  . Social connections:    Talks on phone: Not on file    Gets together: Not on file    Attends religious service: Not on file    Active member of club or organization: Not on file    Attends meetings of clubs or organizations: Not on file    Relationship status: Not on file  .  Intimate partner violence:    Fear of current or ex partner: Not on file    Emotionally abused: Not on file    Physically abused: Not on file    Forced sexual activity: Not on file  Other Topics Concern  . Not on file  Social History Narrative   Retired Archivist.      Family History  Problem Relation Age of Onset  . Heart failure Mother   . Hypertension Mother   . CAD Mother 59  . Emphysema Mother   . Hypertension Father   . Kidney failure Father     Current Outpatient Medications  Medication Sig Dispense Refill  . ambrisentan (LETAIRIS) 10 MG tablet Take 1 tablet (10 mg total) by mouth  daily. 30 tablet 11  . amiodarone (PACERONE) 200 MG tablet Take 200 mg by mouth every evening.     Marland Kitchen atorvastatin (LIPITOR) 40 MG tablet Take 1 tablet (40 mg total) by mouth every evening. 30 tablet 2  . calcium acetate (PHOSLO) 667 MG capsule Take 2 capsules (1,334 mg total) by mouth 3 (three) times daily with meals. 30 capsule 0  . cinacalcet (SENSIPAR) 30 MG tablet Take 3 tablets (90 mg total) by mouth every evening. 30 tablet 0  . levothyroxine (SYNTHROID, LEVOTHROID) 300 MCG tablet Take 1 tablet (300 mcg total) by mouth daily before breakfast. 90 tablet 3  . midodrine (PROAMATINE) 10 MG tablet Take 1 tablet (10 mg total) by mouth 3 (three) times daily. 90 tablet 6  . multivitamin (RENA-VIT) TABS tablet Take 1 tablet by mouth at bedtime. 30 each 0  . pantoprazole (PROTONIX) 40 MG tablet Take 1 tablet (40 mg total) by mouth 2 (two) times daily before a meal. 60 tablet 0  . sucralfate (CARAFATE) 1 g tablet Take 1 tablet (1 g total) by mouth 2 (two) times daily. 60 tablet 0  . tadalafil, PAH, (ADCIRCA) 20 MG tablet 1 tab daily (Patient taking differently: Take 20 mg by mouth daily. ) 30 tablet 11  . warfarin (COUMADIN) 6 MG tablet Take 1 tablet (6 mg total) by mouth one time only at 6 PM. (Patient not taking: Reported on 09/23/2017) 30 tablet 0   No current facility-administered  medications for this visit.      Allergies  Allergen Reactions  . Enalaprilat Swelling    Mouth swelling  . Vasotec [Enalapril] Swelling    MOUTH SWELLING  . Iodinated Diagnostic Agents Nausea And Vomiting and Nausea Only    REVIEW OF SYSTEMS (negative unless checked):   Cardiac:  []  Chest pain or chest pressure? []  Shortness of breath upon activity? []  Shortness of breath when lying flat? []  Irregular heart rhythm?  Vascular:  []  Pain in calf, thigh, or hip brought on by walking? []  Pain in feet at night that wakes you up from your sleep? []  Blood clot in your veins? []  Leg swelling?  Pulmonary:  []  Oxygen at home? []  Productive cough? []  Wheezing?  Neurologic:  []  Sudden weakness in arms or legs? []  Sudden numbness in arms or legs? []  Sudden onset of difficult speaking or slurred speech? []  Temporary loss of vision in one eye? []  Problems with dizziness?  Gastrointestinal:  []  Blood in stool? []  Vomited blood?  Genitourinary:  []  Burning when urinating? []  Blood in urine? [x]   End stage renal disease-HD: M/W/F  Psychiatric:  []  Major depression  Hematologic:  []  Bleeding problems? []  Problems with blood clotting?  Dermatologic:  []  Rashes or ulcers?  Constitutional:  []  Fever or chills?  Ear/Nose/Throat:  []  Change in hearing? []  Nose bleeds? []  Sore throat?  Musculoskeletal:  []  Back pain? []  Joint pain? []  Muscle pain?   Physical Examination   Vitals:   10/07/17 0829  BP: 112/71  Pulse: 93  Resp: 18  Temp: (!) 96.9 F (36.1 C)  TempSrc: Oral  SpO2: 98%  Weight: 281 lb (127.5 kg)  Height: 6\' 1"  (1.854 m)   Body mass index is 37.07 kg/m.  General Alert, O x 3, WD, NAD  Pulmonary Sym exp, good B air movt, CTA B, LIJV TDC  Cardiac, RRR, Nl S1, S2, no Murmurs, No rubs, No S3,S4  Vascular Vessel Right Left  Radial Palpable Palpable  Brachial Palpable Palpable  Ulnar Not palpable Not palpable    Musculo- skeletal M/S 5/5  throughout , Extremities without ischemic changes, L arm incisions healed, on Sonosite: excellent brachial vein 4-5 mm  Neurologic Pain and light touch intact in extremities , Motor exam as listed above     Non-invasive Vascular Imaging   RUE Doppler (10/01/17):   R arm:   Brachial: tri, 4.4 mm  Radial: tri, 2.1 mm  Ulnar: bi, 0.7 mm  RUE Vein Mapping  (10/01/17):   R arm: acceptable vein conduits include none   Medical Decision Making   Elzia Hott. is a 57 y.o. male who presents with ESRD requiring hemodialysis.    Based on vein mapping and examination, this patient's permanent access options include: R 1st stage BrVT.  I had an extensive discussion with this patient in regards to the nature of access surgery, including risk, benefits, and alternatives.    The patient is aware that the risks of access surgery include but are not limited to: bleeding, infection, steal syndrome, nerve damage, ischemic monomelic neuropathy, failure of access to mature, and possible need for additional access procedures in the future.  The patient is aware that I construct transposition procedures in two stages, requiring a separate operation to complete the procedure.  The patient has agreed to proceed with the above procedure which will be scheduled 4 JUN 19.   Adele Barthel, MD, FACS [facility] Office: [phone] Pager: [phone]

## 2017-10-01 NOTE — Assessment & Plan Note (Signed)
Off Letairis and Adcirca while septic in the hospital Felt more SOB Has resumed both meds and is feeling better. Plan: Continue your Latairis and Joanie Coddington as you have been doing. Contibue HD as you have been doing for fluid management Follow up with Crissie Sickles as scheduled 10/21/2017 Follow up with GI, Dr. Henrene Pastor  as is scheduled Follow up with Dr. Lake Bells or Judson Roch NP in 8 weeks. Please contact office for sooner follow up if symptoms do not improve or worsen or seek emergency care

## 2017-10-01 NOTE — Progress Notes (Signed)
Established Dialysis Access   History of Present Illness   Adam [name]. is a 57 y.o. ([birth year]) male who presents for re-evaluation for permanent access.  The patient is right hand dominant.  Previous access procedures have been completed in the left arm.  The patient's complication from previous access procedures include: infection and near rupture.  The patient underwent I&D L arm abscess, exc of left brachiocephalic arteriovenous fistula, VPA L brachial artery and L fem TDC placement on 08/24/17.  The patient left the OR unstable and was left on the vent on vasopressors post-op.  The patient also had the L fem [place] changed to LIJV [place] on 09/01/17.  He patient returns today for evaluation for new permanent access as his L arm is nearly healed.  Past Medical History:  Diagnosis Date  . AICD (automatic cardioverter/defibrillator) present    Medtronic   . Aortic stenosis    moderate AS by 08/2016 echo  . Arthritis    "knees" (08/05/2016)  . Atrial fibrillation ([place])   . ESRD (end stage renal disease) on dialysis ([place])    "M, T, W, T, F; NX stage hemodialysis; I do it at home" (08/05/2016)  . Great toe amputation status ([place])    status post left hallux amputation 08/01/14  . History of blood transfusion 2009   "S/P biopsy for prostate cancer check"  . Hypertension   . Hypothyroidism (acquired)   . Mitral stenosis    moderate mitral stenosis  . Nonischemic cardiomyopathy ([place])   . Pneumonia 2014; 08/05/2016  . PONV (postoperative nausea and vomiting)   . Pulmonary HTN ([place])   . Renal insufficiency   . Thyroid cancer ([place]) 2011    Past Surgical History:  Procedure Laterality Date  . ARTERIAL LINE INSERTION Right 08/24/2017   Procedure: ARTERIAL LINE INSERTION INTO RIGHT FEMORAL ARTERY;  Surgeon: Conrad Athelstan, MD;  Location: [place];  Service: Vascular;  Laterality: Right;  . BIOPSY  09/25/2017   Procedure: BIOPSY;  Surgeon: Ronnette Juniper, MD;  Location: [facility] ENDOSCOPY;  Service:  Gastroenterology;;  . CARDIAC CATHETERIZATION Right 2017  . CARDIAC DEFIBRILLATOR PLACEMENT  2009  . DIALYSIS FISTULA CREATION Left 2003   "upper arm"  . DIALYSIS/PERMA CATHETER REMOVAL Left 09/01/2017   Procedure: DIALYSIS/PERMA CATHETER REMOVAL;  Surgeon: Conrad Bartholomew, MD;  Location: [place];  Service: Vascular;  Laterality: Left;  . ESOPHAGOGASTRODUODENOSCOPY (EGD) WITH PROPOFOL N/A 09/25/2017   Procedure: ESOPHAGOGASTRODUODENOSCOPY (EGD) WITH PROPOFOL;  Surgeon: Ronnette Juniper, MD;  Location: [place];  Service: Gastroenterology;  Laterality: N/A;  . HERNIA REPAIR    . ICD LEAD REMOVAL Right 08/20/2017   Procedure: ICD EXTRACTION WITH C-ARM;  Surgeon: Evans Lance, MD;  Location: [facility] OR;  Service: Cardiovascular;  Laterality: Right;  DR VAN TRIGT TO BACK-UP  . INSERTION OF DIALYSIS CATHETER N/A 11/11/2016   Procedure: INSERTION OF TUNNELED DIALYSIS CATHETER;  Surgeon: Angelia Mould, MD;  Location: [place];  Service: Vascular;  Laterality: N/A;  . INSERTION OF DIALYSIS CATHETER Left 08/24/2017   Procedure: INSERTION OF DIALYSIS CATHETER;  Surgeon: Conrad Cheviot, MD;  Location: [place];  Service: Vascular;  Laterality: Left;  . INSERTION OF DIALYSIS CATHETER Left 09/01/2017   Procedure: INSERTION OF DIALYSIS CATHETER;  Surgeon: Conrad Brooks, MD;  Location: [place];  Service: Vascular;  Laterality: Left;  . KIDNEY TRANSPLANT  2009  . KNEE SURGERY Bilateral 2000s   "drained fluid"  . LAPAROSCOPIC GASTRIC BANDING  2006  .  PROSTATE BIOPSY  2009  . REVISON OF ARTERIOVENOUS FISTULA Left 1/69/6789   Procedure: PLICATION OF ARTERIOVENOUS FISTULA ANEURYSM;  Surgeon: Elam Dutch, MD;  Location: [place];  Service: Vascular;  Laterality: Left;  . REVISON OF ARTERIOVENOUS FISTULA Left 11/10/2016   Procedure: REVISON OF ARTERIOVENOUS FISTULA - LEFT UPPER ARM;  Surgeon: Elam Dutch, MD;  Location: [place];  Service: Vascular;  Laterality: Left;  . REVISON OF ARTERIOVENOUS FISTULA Left 08/24/2017    Procedure: REVISON OF ARTERIOVENOUS FISTULA LEFT;  Surgeon: Conrad La Marque, MD;  Location: [place];  Service: Vascular;  Laterality: Left;  . TEE WITHOUT CARDIOVERSION N/A 08/20/2017   Procedure: TRANSESOPHAGEAL ECHOCARDIOGRAM (TEE);  Surgeon: Evans Lance, MD;  Location: [place];  Service: Cardiovascular;  Laterality: N/A;  . THROMBECTOMY W/ EMBOLECTOMY Left 12/25/2014   Procedure: THROMBECTOMY ARTERIOVENOUS FISTULA;  Surgeon: Elam Dutch, MD;  Location: [place];  Service: Vascular;  Laterality: Left;  . THYROIDECTOMY  2011  . TOE AMPUTATION Bilateral     right 1st and  2nd digits; left 1st and 3rd digits"  . UMBILICAL HERNIA REPAIR  X 2  . VEIN HARVEST Left 08/24/2017   Procedure: VEIN HARVEST;  Surgeon: Conrad , MD;  Location: [facility] OR;  Service: Vascular;  Laterality: Left;    Social History   Socioeconomic History  . Marital status: Married    Spouse name: Not on file  . Number of children: 1  . Years of education: Not on file  . Highest education level: Not on file  Occupational History  . Not on file  Social Needs  . Financial resource strain: Not on file  . Food insecurity:    Worry: Not on file    Inability: Not on file  . Transportation needs:    Medical: Not on file    Non-medical: Not on file  Tobacco Use  . Smoking status: Never Smoker  . Smokeless tobacco: Never Used  Substance and Sexual Activity  . Alcohol use: Yes    Alcohol/week: 0.0 oz    Comment: 08/05/2016 "might have 1 shot/month of liquor"  . Drug use: No  . Sexual activity: Yes  Lifestyle  . Physical activity:    Days per week: Not on file    Minutes per session: Not on file  . Stress: Not on file  Relationships  . Social connections:    Talks on phone: Not on file    Gets together: Not on file    Attends religious service: Not on file    Active member of club or organization: Not on file    Attends meetings of clubs or organizations: Not on file    Relationship status: Not on file  .  Intimate partner violence:    Fear of current or ex partner: Not on file    Emotionally abused: Not on file    Physically abused: Not on file    Forced sexual activity: Not on file  Other Topics Concern  . Not on file  Social History Narrative   Retired Archivist.      Family History  Problem Relation Age of Onset  . Heart failure Mother   . Hypertension Mother   . CAD Mother 31  . Emphysema Mother   . Hypertension Father   . Kidney failure Father     Current Outpatient Medications  Medication Sig Dispense Refill  . ambrisentan (LETAIRIS) 10 MG tablet Take 1 tablet (10 mg total) by mouth  daily. 30 tablet 11  . amiodarone (PACERONE) 200 MG tablet Take 200 mg by mouth every evening.     Marland Kitchen atorvastatin (LIPITOR) 40 MG tablet Take 1 tablet (40 mg total) by mouth every evening. 30 tablet 2  . calcium acetate (PHOSLO) 667 MG capsule Take 2 capsules (1,334 mg total) by mouth 3 (three) times daily with meals. 30 capsule 0  . cinacalcet (SENSIPAR) 30 MG tablet Take 3 tablets (90 mg total) by mouth every evening. 30 tablet 0  . levothyroxine (SYNTHROID, LEVOTHROID) 300 MCG tablet Take 1 tablet (300 mcg total) by mouth daily before breakfast. 90 tablet 3  . midodrine (PROAMATINE) 10 MG tablet Take 1 tablet (10 mg total) by mouth 3 (three) times daily. 90 tablet 6  . multivitamin (RENA-VIT) TABS tablet Take 1 tablet by mouth at bedtime. 30 each 0  . pantoprazole (PROTONIX) 40 MG tablet Take 1 tablet (40 mg total) by mouth 2 (two) times daily before a meal. 60 tablet 0  . sucralfate (CARAFATE) 1 g tablet Take 1 tablet (1 g total) by mouth 2 (two) times daily. 60 tablet 0  . tadalafil, PAH, (ADCIRCA) 20 MG tablet 1 tab daily (Patient taking differently: Take 20 mg by mouth daily. ) 30 tablet 11  . warfarin (COUMADIN) 6 MG tablet Take 1 tablet (6 mg total) by mouth one time only at 6 PM. (Patient not taking: Reported on 09/23/2017) 30 tablet 0   No current facility-administered  medications for this visit.      Allergies  Allergen Reactions  . Enalaprilat Swelling    Mouth swelling  . Vasotec [Enalapril] Swelling    MOUTH SWELLING  . Iodinated Diagnostic Agents Nausea And Vomiting and Nausea Only    REVIEW OF SYSTEMS (negative unless checked):   Cardiac:  []  Chest pain or chest pressure? []  Shortness of breath upon activity? []  Shortness of breath when lying flat? []  Irregular heart rhythm?  Vascular:  []  Pain in calf, thigh, or hip brought on by walking? []  Pain in feet at night that wakes you up from your sleep? []  Blood clot in your veins? []  Leg swelling?  Pulmonary:  []  Oxygen at home? []  Productive cough? []  Wheezing?  Neurologic:  []  Sudden weakness in arms or legs? []  Sudden numbness in arms or legs? []  Sudden onset of difficult speaking or slurred speech? []  Temporary loss of vision in one eye? []  Problems with dizziness?  Gastrointestinal:  []  Blood in stool? []  Vomited blood?  Genitourinary:  []  Burning when urinating? []  Blood in urine? [x]   End stage renal disease-HD: M/W/F  Psychiatric:  []  Major depression  Hematologic:  []  Bleeding problems? []  Problems with blood clotting?  Dermatologic:  []  Rashes or ulcers?  Constitutional:  []  Fever or chills?  Ear/Nose/Throat:  []  Change in hearing? []  Nose bleeds? []  Sore throat?  Musculoskeletal:  []  Back pain? []  Joint pain? []  Muscle pain?   Physical Examination   Vitals:   10/07/17 0829  BP: 112/71  Pulse: 93  Resp: 18  Temp: (!) 96.9 F (36.1 C)  TempSrc: Oral  SpO2: 98%  Weight: 281 lb (127.5 kg)  Height: 6\' 1"  (1.854 m)   Body mass index is 37.07 kg/m.  General Alert, O x 3, WD, NAD  Pulmonary Sym exp, good B air movt, CTA B, LIJV TDC  Cardiac, RRR, Nl S1, S2, no Murmurs, No rubs, No S3,S4  Vascular Vessel Right Left  Radial Palpable Palpable  Brachial Palpable Palpable  Ulnar Not palpable Not palpable    Musculo- skeletal M/S 5/5  throughout , Extremities without ischemic changes, L arm incisions healed, on Sonosite: excellent brachial vein 4-5 mm  Neurologic Pain and light touch intact in extremities , Motor exam as listed above     Non-invasive Vascular Imaging   RUE Doppler (10/01/17):   R arm:   Brachial: tri, 4.4 mm  Radial: tri, 2.1 mm  Ulnar: bi, 0.7 mm  RUE Vein Mapping  (10/01/17):   R arm: acceptable vein conduits include none   Medical Decision Making   Greco Gastelum. is a 57 y.o. male who presents with ESRD requiring hemodialysis.    Based on vein mapping and examination, this patient's permanent access options include: R 1st stage BrVT.  I had an extensive discussion with this patient in regards to the nature of access surgery, including risk, benefits, and alternatives.    The patient is aware that the risks of access surgery include but are not limited to: bleeding, infection, steal syndrome, nerve damage, ischemic monomelic neuropathy, failure of access to mature, and possible need for additional access procedures in the future.  The patient is aware that I construct transposition procedures in two stages, requiring a separate operation to complete the procedure.  The patient has agreed to proceed with the above procedure which will be scheduled 4 JUN 19.   Adele Barthel, MD, FACS [facility] Office: [phone] Pager: [phone]

## 2017-10-01 NOTE — H&P (View-Only) (Signed)
Established Dialysis Access   History of Present Illness   Adam [name]. is a 57 y.o. (1961/03/22) male who presents for re-evaluation for permanent access.  The patient is right hand dominant.  Previous access procedures have been completed in the left arm.  The patient's complication from previous access procedures include: infection and near rupture.  The patient underwent I&D L arm abscess, exc of left brachiocephalic arteriovenous fistula, VPA L brachial artery and L fem TDC placement on 08/24/17.  The patient left the OR unstable and was left on the vent on vasopressors post-op.  The patient also had the L fem [place] changed to LIJV [place] on 09/01/17.  He patient returns today for evaluation for new permanent access as his L arm is nearly healed.  Past Medical History:  Diagnosis Date  . AICD (automatic cardioverter/defibrillator) present    Medtronic   . Aortic stenosis    moderate AS by 08/2016 echo  . Arthritis    "knees" (08/05/2016)  . Atrial fibrillation ([place])   . ESRD (end stage renal disease) on dialysis ([place])    "M, T, W, T, F; NX stage hemodialysis; I do it at home" (08/05/2016)  . Great toe amputation status ([place])    status post left hallux amputation 08/01/14  . History of blood transfusion 2009   "S/P biopsy for prostate cancer check"  . Hypertension   . Hypothyroidism (acquired)   . Mitral stenosis    moderate mitral stenosis  . Nonischemic cardiomyopathy ([place])   . Pneumonia 2014; 08/05/2016  . PONV (postoperative nausea and vomiting)   . Pulmonary HTN ([place])   . Renal insufficiency   . Thyroid cancer ([place]) 2011    Past Surgical History:  Procedure Laterality Date  . ARTERIAL LINE INSERTION Right 08/24/2017   Procedure: ARTERIAL LINE INSERTION INTO RIGHT FEMORAL ARTERY;  Surgeon: Conrad [place]oliet, MD;  Location: [place];  Service: Vascular;  Laterality: Right;  . BIOPSY  09/25/2017   Procedure: BIOPSY;  Surgeon: Ronnette Juniper, MD;  Location[facility] ENDOSCOPY;  Service:  Gastroenterology;;  . CARDIAC CATHETERIZATION Right 2017  . CARDIAC DEFIBRILLATOR PLACEMENT  2009  . DIALYSIS FISTULA CREATION Left 2003   "upper arm"  . DIALYSIS/PERMA CATHETER REMOVAL Left 09/01/2017   Procedure: DIALYSIS/PERMA CATHETER REMOVAL;  Surgeon: Conrad [place], MD;  Location: [place];  Service: Vascular;  Laterality: Left;  . ESOPHAGOGASTRODUODENOSCOPY (EGD) WITH PROPOFOL N/A 09/25/2017   Procedure: ESOPHAGOGASTRODUODENOSCOPY (EGD) WITH PROPOFOL;  Surgeon: Ronnette Juniper, MD;  Location: [place];  Service: Gastroenterology;  Laterality: N/A;  . HERNIA REPAIR    . ICD LEAD REMOVAL Right 08/20/2017   Procedure: ICD EXTRACTION WITH C-ARM;  Surgeon: Evans Lance, MD;  Location: [facility] OR;  Service: Cardiovascular;  Laterality: Right;  DR VAN TRIGT TO BACK-UP  . INSERTION OF DIALYSIS CATHETER N/A 11/11/2016   Procedure: INSERTION OF TUNNELED DIALYSIS CATHETER;  Surgeon: Angelia Mould, MD;  Location: [place];  Service: Vascular;  Laterality: N/A;  . INSERTION OF DIALYSIS CATHETER Left 08/24/2017   Procedure: INSERTION OF DIALYSIS CATHETER;  Surgeon: Conrad Yorkshire, MD;  Location: [place];  Service: Vascular;  Laterality: Left;  . INSERTION OF DIALYSIS CATHETER Left 09/01/2017   Procedure: INSERTION OF DIALYSIS CATHETER;  Surgeon: Conrad Goodrich, MD;  Location: [place];  Service: Vascular;  Laterality: Left;  . KIDNEY TRANSPLANT  2009  . KNEE SURGERY Bilateral 2000s   "drained fluid"  . LAPAROSCOPIC GASTRIC BANDING  2006  .  PROSTATE BIOPSY  2009  . REVISON OF ARTERIOVENOUS FISTULA Left 1/61/0960   Procedure: PLICATION OF ARTERIOVENOUS FISTULA ANEURYSM;  Surgeon: Elam Dutch, MD;  Location: [place];  Service: Vascular;  Laterality: Left;  . REVISON OF ARTERIOVENOUS FISTULA Left 11/10/2016   Procedure: REVISON OF ARTERIOVENOUS FISTULA - LEFT UPPER ARM;  Surgeon: Elam Dutch, MD;  Location: [place];  Service: Vascular;  Laterality: Left;  . REVISON OF ARTERIOVENOUS FISTULA Left 08/24/2017    Procedure: REVISON OF ARTERIOVENOUS FISTULA LEFT;  Surgeon: Conrad [place], MD;  Location: [place];  Service: Vascular;  Laterality: Left;  . TEE WITHOUT CARDIOVERSION N/A 08/20/2017   Procedure: TRANSESOPHAGEAL ECHOCARDIOGRAM (TEE);  Surgeon: Evans Lance, MD;  Location: [place];  Service: Cardiovascular;  Laterality: N/A;  . THROMBECTOMY W/ EMBOLECTOMY Left 12/25/2014   Procedure: THROMBECTOMY ARTERIOVENOUS FISTULA;  Surgeon: Elam Dutch, MD;  Location: [place];  Service: Vascular;  Laterality: Left;  . THYROIDECTOMY  2011  . TOE AMPUTATION Bilateral     right 1st and  2nd digits; left 1st and 3rd digits"  . UMBILICAL HERNIA REPAIR  X 2  . VEIN HARVEST Left 08/24/2017   Procedure: VEIN HARVEST;  Surgeon: Conrad Seymour, MD;  Location: [facility] OR;  Service: Vascular;  Laterality: Left;    Social History   Socioeconomic History  . Marital status: Married    Spouse name: Not on file  . Number of children: 1  . Years of education: Not on file  . Highest education level: Not on file  Occupational History  . Not on file  Social Needs  . Financial resource strain: Not on file  . Food insecurity:    Worry: Not on file    Inability: Not on file  . Transportation needs:    Medical: Not on file    Non-medical: Not on file  Tobacco Use  . Smoking status: Never Smoker  . Smokeless tobacco: Never Used  Substance and Sexual Activity  . Alcohol use: Yes    Alcohol/week: 0.0 oz    Comment: 08/05/2016 "might have 1 shot/month of liquor"  . Drug use: No  . Sexual activity: Yes  Lifestyle  . Physical activity:    Days per week: Not on file    Minutes per session: Not on file  . Stress: Not on file  Relationships  . Social connections:    Talks on phone: Not on file    Gets together: Not on file    Attends religious service: Not on file    Active member of club or organization: Not on file    Attends meetings of clubs or organizations: Not on file    Relationship status: Not on file  .  Intimate partner violence:    Fear of current or ex partner: Not on file    Emotionally abused: Not on file    Physically abused: Not on file    Forced sexual activity: Not on file  Other Topics Concern  . Not on file  Social History Narrative   Retired Archivist.      Family History  Problem Relation Age of Onset  . Heart failure Mother   . Hypertension Mother   . CAD Mother 42  . Emphysema Mother   . Hypertension Father   . Kidney failure Father     Current Outpatient Medications  Medication Sig Dispense Refill  . ambrisentan (LETAIRIS) 10 MG tablet Take 1 tablet (10 mg total) by mouth  daily. 30 tablet 11  . amiodarone (PACERONE) 200 MG tablet Take 200 mg by mouth every evening.     Marland Kitchen atorvastatin (LIPITOR) 40 MG tablet Take 1 tablet (40 mg total) by mouth every evening. 30 tablet 2  . calcium acetate (PHOSLO) 667 MG capsule Take 2 capsules (1,334 mg total) by mouth 3 (three) times daily with meals. 30 capsule 0  . cinacalcet (SENSIPAR) 30 MG tablet Take 3 tablets (90 mg total) by mouth every evening. 30 tablet 0  . levothyroxine (SYNTHROID, LEVOTHROID) 300 MCG tablet Take 1 tablet (300 mcg total) by mouth daily before breakfast. 90 tablet 3  . midodrine (PROAMATINE) 10 MG tablet Take 1 tablet (10 mg total) by mouth 3 (three) times daily. 90 tablet 6  . multivitamin (RENA-VIT) TABS tablet Take 1 tablet by mouth at bedtime. 30 each 0  . pantoprazole (PROTONIX) 40 MG tablet Take 1 tablet (40 mg total) by mouth 2 (two) times daily before a meal. 60 tablet 0  . sucralfate (CARAFATE) 1 g tablet Take 1 tablet (1 g total) by mouth 2 (two) times daily. 60 tablet 0  . tadalafil, PAH, (ADCIRCA) 20 MG tablet 1 tab daily (Patient taking differently: Take 20 mg by mouth daily. ) 30 tablet 11  . warfarin (COUMADIN) 6 MG tablet Take 1 tablet (6 mg total) by mouth one time only at 6 PM. (Patient not taking: Reported on 09/23/2017) 30 tablet 0   No current facility-administered  medications for this visit.      Allergies  Allergen Reactions  . Enalaprilat Swelling    Mouth swelling  . Vasotec [Enalapril] Swelling    MOUTH SWELLING  . Iodinated Diagnostic Agents Nausea And Vomiting and Nausea Only    REVIEW OF SYSTEMS (negative unless checked):   Cardiac:  []  Chest pain or chest pressure? []  Shortness of breath upon activity? []  Shortness of breath when lying flat? []  Irregular heart rhythm?  Vascular:  []  Pain in calf, thigh, or hip brought on by walking? []  Pain in feet at night that wakes you up from your sleep? []  Blood clot in your veins? []  Leg swelling?  Pulmonary:  []  Oxygen at home? []  Productive cough? []  Wheezing?  Neurologic:  []  Sudden weakness in arms or legs? []  Sudden numbness in arms or legs? []  Sudden onset of difficult speaking or slurred speech? []  Temporary loss of vision in one eye? []  Problems with dizziness?  Gastrointestinal:  []  Blood in stool? []  Vomited blood?  Genitourinary:  []  Burning when urinating? []  Blood in urine? [x]   End stage renal disease-HD: M/W/F  Psychiatric:  []  Major depression  Hematologic:  []  Bleeding problems? []  Problems with blood clotting?  Dermatologic:  []  Rashes or ulcers?  Constitutional:  []  Fever or chills?  Ear/Nose/Throat:  []  Change in hearing? []  Nose bleeds? []  Sore throat?  Musculoskeletal:  []  Back pain? []  Joint pain? []  Muscle pain?   Physical Examination   Vitals:   10/07/17 0829  BP: 112/71  Pulse: 93  Resp: 18  Temp: (!) 96.9 F (36.1 C)  TempSrc: Oral  SpO2: 98%  Weight: 281 lb (127.5 kg)  Height: 6\' 1"  (1.854 m)   Body mass index is 37.07 kg/m.  General Alert, O x 3, WD, NAD  Pulmonary Sym exp, good B air movt, CTA B, LIJV TDC  Cardiac, RRR, Nl S1, S2, no Murmurs, No rubs, No S3,S4  Vascular Vessel Right Left  Radial Palpable Palpable  Brachial Palpable Palpable  Ulnar Not palpable Not palpable    Musculo- skeletal M/S 5/5  throughout , Extremities without ischemic changes, L arm incisions healed, on Sonosite: excellent brachial vein 4-5 mm  Neurologic Pain and light touch intact in extremities , Motor exam as listed above     Non-invasive Vascular Imaging   RUE Doppler (10/01/17):   R arm:   Brachial: tri, 4.4 mm  Radial: tri, 2.1 mm  Ulnar: bi, 0.7 mm  RUE Vein Mapping  (10/01/17):   R arm: acceptable vein conduits include none   Medical Decision Making   Othal Kubitz. is a 57 y.o. male who presents with ESRD requiring hemodialysis.    Based on vein mapping and examination, this patient's permanent access options include: R 1st stage BrVT.  I had an extensive discussion with this patient in regards to the nature of access surgery, including risk, benefits, and alternatives.    The patient is aware that the risks of access surgery include but are not limited to: bleeding, infection, steal syndrome, nerve damage, ischemic monomelic neuropathy, failure of access to mature, and possible need for additional access procedures in the future.  The patient is aware that I construct transposition procedures in two stages, requiring a separate operation to complete the procedure.  The patient has agreed to proceed with the above procedure which will be scheduled 4 JUN 19.   Adele Barthel, MD, FACS [facility] Office: [phone] Pager: [phone]

## 2017-10-01 NOTE — Patient Instructions (Addendum)
It is nice to meet you today. Continue your Latairis and Joanie Coddington as you have been doing. Contibue HD as you have been doing for fluid management Follow up with Crissie Sickles as scheduled 10/21/2017 Follow up with GI, Dr. Henrene Pastor  as is scheduled Follow up with Dr. Lake Bells or Judson Roch NP in 8 weeks. Please contact office for sooner follow up if symptoms do not improve or worsen or seek emergency care

## 2017-10-01 NOTE — Progress Notes (Signed)
History of Present Illness Adam [name]. is a 57 y.o. male with Pulmonary HTN, renal failure She is followed by Dr. Lake Bells  Synopsis: Diagnosed with pulmonary hypertension in 2012. 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 Unfortunately his kidney transplant failed for uncertain reasons by [id number]. Was diagnosed with pulmonary hypertension by 2012 in [place]. Was started on [place] in [place] and moved to [place] not long afterwards. Developed atrial fibrillation around 2009, had a defibrillator placed.   Brief Hospital Course:  Patient admitted for near syncope and found to be anemic to 6.6 which improved to 7.7 with 1u transfusion.  FOBT was positive and patient had been complaining of tarry stools (Cdiff neg).  They had recent admission for sepsis and ICD and fistula had been removed. Hgb stayed stable after transfusion and GI performed endoscopy showing multiple significant hemmorhagic appearing mucosal changes.   Cardiology said that life vest should be worn until ICD disposition in outpatient visit but that patient was stable from cardiac standpoint.      10/01/2017 Hospital Follow up: Pt. Presents for follow up. He states he has been doing well since hospitalization.He is going to HD MWF.He was off his Letairis and Adcirca x 2.5 weeks due to low BP with sepsis. He states he did notice more shortness of breath off the meds.. He has been able to resume medication 5 days ago.He says he is feeling less short of breath, but he is nowhere near as good as he was prior to stopping therapy.He has been started on Protonix and Carafate, and he will have a repeat colonoscopy .He has follow up scheduled with Dr. Lovena Le. He is currently not wearing his life vest.I reminded he needs to wear that at al times. He is having vein mapping for new fistula placement by Dr. Bridgett Larsson. He currently has a tunneled cath.He is compliant with his HD. He is  having no swelling or issues that are not his baseline. He denies fever, chest pain, orthopnea or hemoptysis. Stools are getting less and lighter. Last CBC was 8.5. He has had HGB done at HD on Monday, and he states it was fine. He has had a total of 2 units PRBC's in the last month.    Test Results: 08/2015 [facility]: Cardiac Index (l/min/m2) 3.7 L/min/m2  Right Atrium Mean Pressure (mmHg) 5 mmHg   Right Ventricle Systolic Pressure (mmHg) 46 mmHg   Pulmonary Artery Mean Pressure (mmHg) 31 mmHg   Pulmonary Wedge Pressure (mmHg) 11 mmHg   Pulmonary Vascular Resistance (Wood units) 2.2 Wood units   He had a second evaluation for renal transplant at [facility] in 2017 but was denied because of his hypertension and atrial fibrillation.  His dry weight is 126kg, his BP is best there.    He was hospitalized in early 2018 for healthcare associated pneumonia     CBC Latest Ref Rng & Units 09/26/2017 09/25/2017 09/24/2017  WBC 4.0 - 10.5 K/uL 3.9(L) 4.1 4.7  Hemoglobin 13.0 - 17.0 g/dL 8.5(L) 7.3(L) 7.6(L)  Hematocrit 39.0 - 52.0 % 27.9(L) 24.0(L) 25.0(L)  Platelets 150 - 400 K/uL 81(L) 78(L) 84(L)    BMP Latest Ref Rng & Units 09/26/2017 09/25/2017 09/24/2017  Glucose 65 - 99 mg/dL 62(L) 69 82  BUN 6 - 20 mg/dL _0 Creatinine 0.61 - 1.24 mg/dL 4.61(H) 6.16(H) 5.40(H)  Sodium 135 - 145 mmol/L 139 136 136  Potassium 3.5 - 5.1  mmol/L 3.7 3.4(L) 3.5  Chloride 101 - 111 mmol/L 100(L) 94(L) 95(L)  CO2 22 - 32 mmol/L _0 Calcium 8.9 - 10.3 mg/dL 8.3(L) 8.2(L) 8.3(L)    BNP No results found for: BNP  ProBNP No results found for: PROBNP  PFT No results found for: FEV1PRE, FEV1POST, FVCPRE, FVCPOST, TLC, DLCOUNC, PREFEV1FVCRT, PSTFEV1FVCRT  Dg Chest 2 View  Result Date: 09/24/2017 CLINICAL DATA:  Acute onset of syncope. EXAM: CHEST - 2 VIEW COMPARISON:  Chest radiograph performed 09/01/2017 FINDINGS: The lungs are well-aerated. Vascular congestion is noted. There is no evidence of  focal opacification, pleural effusion or pneumothorax. The heart is mildly enlarged. No acute osseous abnormalities are seen. A left-sided dual-lumen catheter is noted ending about the cavoatrial junction. IMPRESSION: Vascular congestion and mild cardiomegaly; lungs remain grossly clear. Electronically Signed   By: Garald Balding M.D.   On: 09/24/2017 01:02   Ct Head Wo Contrast  Result Date: 09/24/2017 CLINICAL DATA:  57 y/o  M; syncope with seizure-like activity. EXAM: CT HEAD WITHOUT CONTRAST TECHNIQUE: Contiguous axial images were obtained from the base of the skull through the vertex without intravenous contrast. COMPARISON:  04/14/2016 sinus CT. FINDINGS: Brain: No evidence of acute infarction, hemorrhage, hydrocephalus, extra-axial collection or mass lesion/mass effect. Vascular: Extensive calcification of carotid siphons, vertebral arteries, in median vessels of scalp and visible face. Skull: Normal. Negative for fracture or focal lesion. Sinuses/Orbits: Near absence of bony structures of the nasal passages including nasal septum and turbinates. Near complete absence of ethmoid air cells and large defect within the left maxillary sinus medial wall. Apparent bony dehiscence of the right lamina papyracea and of the right-greater-than-left olfactory grooves. Moderate mucosal thickening of the walls of the open nasal cavity and sphenoid sinuses with opacification of the right maxillary sinus. Chronic inflammatory changes of the walls of the sinuses. Findings are similar to prior sinus CT given differences in technique. Normal aeration of mastoid air cells.  Orbits are unremarkable. Other: None. IMPRESSION: 1. No acute intracranial abnormality. 2. 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 Extensive paranasal sinus disease of the residual sinuses. Findings are similar to prior CT sinus. Electronically Signed   By: Kristine Garbe M.D.   On: 09/24/2017 00:47   Dg Chest Port 1  View  Result Date: 09/01/2017 CLINICAL DATA:  Dialysis catheter insertion EXAM: PORTABLE CHEST 1 VIEW COMPARISON:  08/25/2017 FINDINGS: External defibrillator. There is a Perma catheter on the left with tip at the right atrium, partially obscured by the defibrillator hardware. Negative for pneumothorax. Cardiomegaly and chronic interstitial coarsening. Left subclavian stenting. IMPRESSION: Perma catheter with tip at the right atrium.  No pneumothorax. Electronically Signed   By: Monte Fantasia M.D.   On: 09/01/2017 19:23   Dg Fluoro Guide Cv Line Left  Result Date: 09/02/2017 CLINICAL DATA:  57 year old male with a history of dialysis EXAM: CHEST FLUOROSCOPY TECHNIQUE: Real-time fluoroscopic evaluation of the chest was performed in the operating room. FLUOROSCOPY TIME:  1 minutes 15 seconds COMPARISON:  08/25/2017 FINDINGS: Intraoperative fluoroscopic imaging of the chest demonstrates placement of left IJ hemodialysis catheter. Tip of the catheter appears to project over the superior cavoatrial junction. Stent system of the left upper extremity. IMPRESSION: Intraoperative fluoroscopic spot images of left IJ hemodialysis catheter. Please refer to the dictated operative report for full details of intraoperative findings and procedure. Electronically Signed   By: Corrie Mckusick D.O.   On: 09/02/2017 08:21     Past medical hx Past  Medical History:  Diagnosis Date  . AICD (automatic cardioverter/defibrillator) present    [employer]   . Aortic stenosis    moderate AS by 08/2016 echo  . Arthritis    "knees" (08/05/2016)  . Atrial fibrillation ([place])   . ESRD (end stage renal disease) on dialysis ([employer])    "M, T, W, T, F; NX stage hemodialysis; I do it at home" (08/05/2016)  . Great toe amputation status ([place])    status post left hallux amputation 08/01/14  . History of blood transfusion 2009   "S/P biopsy for prostate cancer check"  . Hypertension   . Hypothyroidism (acquired)   . Mitral stenosis     moderate mitral stenosis  . Nonischemic cardiomyopathy ([place])   . Pneumonia 2014; 08/05/2016  . PONV (postoperative nausea and vomiting)   . Pulmonary HTN ([place])   . Renal insufficiency   . Thyroid cancer ([place]) 2011     Social History   Tobacco Use  . Smoking status: Never Smoker  . Smokeless tobacco: Never Used  Substance Use Topics  . Alcohol use: Yes    Alcohol/week: 0.0 oz    Comment: 08/05/2016 "might have 1 shot/month of liquor"  . Drug use: No    Mr.[place]burg reports that he has never smoked. He has never used smokeless tobacco. He reports that he drinks alcohol. He reports that he does not use drugs.  Tobacco Cessation: Never smoker  Past surgical hx, Family hx, Social hx all reviewed.  Current Outpatient Medications on File Prior to Visit  Medication Sig  . ambrisentan (LETAIRIS) 10 MG tablet Take 1 tablet (10 mg total) by mouth daily.  Marland Kitchen amiodarone (PACERONE) 200 MG tablet Take 200 mg by mouth every evening.   Marland Kitchen atorvastatin (LIPITOR) 40 MG tablet Take 1 tablet (40 mg total) by mouth every evening.  . calcium acetate (PHOSLO) 667 MG capsule Take 2 capsules (1,334 mg total) by mouth 3 (three) times daily with meals.  Marland Kitchen ceFAZolin (ANCEF) IVPB Inject 2 g into the vein every dialysis (GIVE AT END OF EACH HD SESSION (MWF schedule)). Indication: MSSA Bacteremia Last Day of Therapy: 10/05/17 Labs - Once weekly:  CBC/D and BMP, Labs - Every other week:  ESR and CRP  . cinacalcet (SENSIPAR) 30 MG tablet Take 3 tablets (90 mg total) by mouth every evening.  Marland Kitchen levothyroxine (SYNTHROID, LEVOTHROID) 300 MCG tablet Take 1 tablet (300 mcg total) by mouth daily before breakfast.  . midodrine (PROAMATINE) 10 MG tablet Take 1 tablet (10 mg total) by mouth 3 (three) times daily.  . multivitamin (RENA-VIT) TABS tablet Take 1 tablet by mouth at bedtime.  . pantoprazole (PROTONIX) 40 MG tablet Take 1 tablet (40 mg total) by mouth 2 (two) times daily before a meal.  . sucralfate (CARAFATE) 1 g  tablet Take 1 tablet (1 g total) by mouth 2 (two) times daily.  . tadalafil, PAH, (ADCIRCA) 20 MG tablet 1 tab daily (Patient taking differently: Take 20 mg by mouth daily. )  . warfarin (COUMADIN) 6 MG tablet Take 1 tablet (6 mg total) by mouth one time only at 6 PM. (Patient not taking: Reported on 09/23/2017)   No current facility-administered medications on file prior to visit.      Allergies  Allergen Reactions  . Enalaprilat Swelling    Mouth swelling  . Vasotec [Enalapril] Swelling    MOUTH SWELLING  . Iodinated Diagnostic Agents Nausea And Vomiting and Nausea Only    Review Of Systems:  Constitutional:  No  weight loss, night sweats,  Fevers, chills, fatigue, or  lassitude.  HEENT:   No headaches,  Difficulty swallowing,  Tooth/dental problems, or  Sore throat,                No sneezing, itching, ear ache, nasal congestion, post nasal drip,   CV:  No chest pain,  Orthopnea, PND, swelling in lower extremities, anasarca, dizziness, palpitations, syncope.   GI  No heartburn, indigestion, abdominal pain, nausea, vomiting, diarrhea, change in bowel habits, loss of appetite, bloody stools.   Resp: + shortness of breath with exertion less at rest.  No excess mucus, no productive cough,  No non-productive cough,  No coughing up of blood.  No change in color of mucus.  No wheezing.  No chest wall deformity  Skin: no rash or lesions.  GU: no dysuria, change in color of urine, no urgency or frequency.  No flank pain, no hematuria   MS:  No joint pain or swelling.  No decreased range of motion.  No back pain.  Psych:  No change in mood or affect. No depression or anxiety.  No memory loss.   Vital Signs BP (!) 94/58 (BP Location: Right Arm, Cuff Size: Normal)   Pulse 75   Ht _0  (1.854 m)   Wt 284 lb 9.6 oz (129.1 kg)   SpO2 98%   BMI 37.55 kg/m    Physical Exam:  General- No distress,  A&Ox3, pleasant ENT: No sinus tenderness, TM clear, pale nasal mucosa, no oral  exudate,no post nasal drip, no LAN Cardiac: S1, S2, regular rate and rhythm, no murmur Chest: No wheeze/ rales/ dullness; no accessory muscle use, no nasal flaring, no sternal retractions Abd.: Soft Non-tender, ND, BS +, Obese Ext: No clubbing cyanosis, edema Neuro:  normal strength, MAE x 4, A&O x 3 Skin: No rashes, warm and dry, dressing to L upper arm Psych: normal mood and behavior   Assessment/Plan  Pulmonary hypertension ([place]) Off Letairis and Adcirca while septic in the hospital Felt more SOB Has resumed both meds and is feeling better. Plan: Continue your Latairis and Joanie Coddington as you have been doing. Contibue HD as you have been doing for fluid management Follow up with Crissie Sickles as scheduled 10/21/2017 Follow up with GI, Dr. Henrene Pastor  as is scheduled Follow up with Dr. Lake Bells or Judson Roch NP in 8 weeks. Please contact office for sooner follow up if symptoms do not improve or worsen or seek emergency care      Magdalen Spatz, NP 10/01/2017  11:32 AM

## 2017-10-01 NOTE — Assessment & Plan Note (Signed)
No additional episodes of lightheadedness or syncope. Doing very well since hospital discharge. Has followed up with all specialists as planned. Will place referral to PT today for deconditioning after prolonged hospitalization.

## 2017-10-05 ENCOUNTER — Encounter (INDEPENDENT_AMBULATORY_CARE_PROVIDER_SITE_OTHER): Payer: Self-pay

## 2017-10-05 ENCOUNTER — Encounter: Payer: Self-pay | Admitting: Cardiology

## 2017-10-05 ENCOUNTER — Encounter: Payer: Self-pay | Admitting: Internal Medicine

## 2017-10-05 LAB — CULTURE BLOOD MANUAL
MICRO NUMBER 7002: 90611505
MICRO NUMBER 7002: 90611512
RESULT 123: NO GROWTH
Result: NO GROWTH

## 2017-10-06 ENCOUNTER — Telehealth: Payer: Self-pay | Admitting: Cardiovascular Disease

## 2017-10-06 NOTE — Telephone Encounter (Signed)
New Message   If Home Health RN is calling please get Coumadin Nurse on the phone STAT  1.  Are you calling in regards to an appointment? no 2.  Are you calling for a refill ? no  3.  Are you having bleeding issues? no  4.  Do you need clearance to hold Coumadin? No  Patient is calling because he had to stop taking coumadin for a procedure. He needs to know when to restart and at what dosage. Please call.    Please route to the Coumadin Clinic Pool

## 2017-10-06 NOTE — Telephone Encounter (Signed)
Patient was unclear as to when he should start warfarin after hospital d/c on 5/18.  Per hospital notes he was given a dose day before discharge.  Advised that he restart today with 9 mg daily except 6 mg MWF (slighly lower than last stable dose).  We will see him Monday for INR.

## 2017-10-07 ENCOUNTER — Ambulatory Visit (INDEPENDENT_AMBULATORY_CARE_PROVIDER_SITE_OTHER): Payer: Self-pay | Admitting: Vascular Surgery

## 2017-10-07 ENCOUNTER — Other Ambulatory Visit: Payer: Self-pay | Admitting: *Deleted

## 2017-10-07 ENCOUNTER — Other Ambulatory Visit: Payer: Self-pay

## 2017-10-07 ENCOUNTER — Encounter: Payer: Self-pay | Admitting: *Deleted

## 2017-10-07 ENCOUNTER — Encounter: Payer: Self-pay | Admitting: Vascular Surgery

## 2017-10-07 ENCOUNTER — Telehealth: Payer: Self-pay | Admitting: *Deleted

## 2017-10-07 ENCOUNTER — Telehealth: Payer: Self-pay | Admitting: Pharmacist

## 2017-10-07 VITALS — BP 112/71 | HR 93 | Temp 96.9°F | Resp 18 | Ht 73.0 in | Wt 281.0 lb

## 2017-10-07 DIAGNOSIS — A4101 Sepsis due to Methicillin susceptible Staphylococcus aureus: Secondary | ICD-10-CM | POA: Diagnosis not present

## 2017-10-07 DIAGNOSIS — D631 Anemia in chronic kidney disease: Secondary | ICD-10-CM | POA: Diagnosis not present

## 2017-10-07 DIAGNOSIS — Z992 Dependence on renal dialysis: Secondary | ICD-10-CM

## 2017-10-07 DIAGNOSIS — I4891 Unspecified atrial fibrillation: Secondary | ICD-10-CM | POA: Diagnosis not present

## 2017-10-07 DIAGNOSIS — N186 End stage renal disease: Secondary | ICD-10-CM

## 2017-10-07 NOTE — Telephone Encounter (Signed)
Patient already instructed to take last coumadin (warfarin) dose today, then hold for 5 days.  Will follow up with coumadin clinic after procedure.

## 2017-10-07 NOTE — Telephone Encounter (Signed)
Hi there! Is there a specific need from the APP pre-op pool on this patient? I see that pharmacy clearance was already addressed in notes below and email. Syan Cullimore PA-C

## 2017-10-07 NOTE — Telephone Encounter (Signed)
-----   Message from [name], RN sent at 10/07/2017  9:13 AM EDT ----- Regarding: FW: COUMADIN HOLD FOR SURGERY   ----- Message ----- From: [name], RN Sent: 10/07/2017   9:08 AM To: Cv Div Preop Pharm Subject: COUMADIN HOLD FOR SURGERY                      Requesting Coumadin clearance. Will need to hold for 5 days pre-op. Surgery for first stage BVT for HD access with Dr. Bridgett Larsson on 10/13/17. Thank you, Archivist

## 2017-10-07 NOTE — Telephone Encounter (Signed)
-----   Message from [name], RN sent at 10/07/2017  9:08 AM EDT ----- Regarding: COUMADIN HOLD FOR SURGERY Requesting Coumadin clearance. Will need to hold for 5 days pre-op. Surgery for first stage BVT for HD access with Dr. Bridgett Larsson on 10/13/17. Thank you, Archivist

## 2017-10-07 NOTE — Telephone Encounter (Signed)
Patient with diagnosis of Afib on warfarin for anticoagulation.    Procedure: first stage BVT for HD Date of procedure: 10/13/17  CHADS2-VASc score of  3 (CHF, HTN, AGE, DM2, stroke/tia x 2, CAD, AGE, male)  Per office protocol, patient can hold warfarin for 5 days prior to procedure.   Patient will not need bridging with Lovenox (enoxaparin) around procedure.

## 2017-10-08 ENCOUNTER — Other Ambulatory Visit: Payer: Self-pay | Admitting: Cardiology

## 2017-10-08 NOTE — Telephone Encounter (Signed)
Rx sent to pharmacy   

## 2017-10-08 NOTE — Telephone Encounter (Signed)
Spoke with Dr Sallyanne Kuster who has no concerns about cardiac/medical clearance on this patient -> we were only contacted for pharm clearance which has been addressed. Will remove from pool. Dayna Dunn PA-C

## 2017-10-09 DIAGNOSIS — Z992 Dependence on renal dialysis: Secondary | ICD-10-CM | POA: Diagnosis not present

## 2017-10-09 DIAGNOSIS — N186 End stage renal disease: Secondary | ICD-10-CM | POA: Diagnosis not present

## 2017-10-09 DIAGNOSIS — E1129 Type 2 diabetes mellitus with other diabetic kidney complication: Secondary | ICD-10-CM | POA: Diagnosis not present

## 2017-10-12 ENCOUNTER — Encounter (HOSPITAL_COMMUNITY): Payer: Self-pay | Admitting: *Deleted

## 2017-10-12 ENCOUNTER — Encounter: Payer: Self-pay | Admitting: Family Medicine

## 2017-10-12 ENCOUNTER — Other Ambulatory Visit: Payer: Self-pay

## 2017-10-12 ENCOUNTER — Other Ambulatory Visit: Payer: Self-pay | Admitting: Cardiovascular Disease

## 2017-10-12 DIAGNOSIS — D631 Anemia in chronic kidney disease: Secondary | ICD-10-CM | POA: Diagnosis not present

## 2017-10-12 DIAGNOSIS — N186 End stage renal disease: Secondary | ICD-10-CM | POA: Diagnosis not present

## 2017-10-12 DIAGNOSIS — D509 Iron deficiency anemia, unspecified: Secondary | ICD-10-CM | POA: Diagnosis not present

## 2017-10-12 MED ORDER — CEFAZOLIN SODIUM 10 G IJ SOLR
3.0000 g | INTRAMUSCULAR | Status: AC
  Administered 2017-10-13: 3 g via INTRAVENOUS
  Filled 2017-10-12: qty 3

## 2017-10-12 NOTE — Progress Notes (Signed)
Anesthesia asked to review pt history. 

## 2017-10-12 NOTE — Progress Notes (Signed)
Pt denies any acute cardiopulmonary issues. Pt under the care of Dr. Percival Spanish, Cardiology. Pt stated that his last dose of Coumadin was " over a week ago."  Pt made aware to stop taking vitamins, fish oil and herbal medications. Do not take any NSAIDs ie: Ibuprofen, Advil, Naproxen (Aleve), Motrin, BC and Goody Powder. Pt verbalized understanding of all pre-op instructions.

## 2017-10-12 NOTE — Progress Notes (Signed)
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  He was admitted 08/17/17-08/28/17 for MSSA bacteremia. ID was consulted. He subsequently had his ICD removed 08/21/17 and also underwent I&D of LUE abscess with excision of distal half of left brachiocephalic AVF with stent includes, and placement of left femoral tunneled dialysis catheter on 08/24/17. He had to remain intubated post-operatively due to hypoxia, but was successfully extubated on 08/25/17. Discharged (08/28/17) with a Zoll life vest.  At last office visit with EP cardiologist Dr. Sallyanne Kuster 09/21/17, pt to continue to wear life vest until receives second opinion about whether or not to reimplant ICD from Dr. Lovena Le (has appointment 10/21/17)  - Darcella Cheshire and Adcirca for pulmonary hypertension  - Pt to hold coumadin 5 days before surgery  - Hospitalized 5/15-5/18/19 for near syncope, symptomatic anemia (hgb 6.6)   PROVIDERS: PCP is [name], MD   Patient Care Team: Trula Slade, DPM as Consulting Physician (Podiatry) Fleet Contras, MD as Consulting Physician (Nephrology)  Cardiologist is Minus Breeding, MD (established 05/2014 after moving from [place]; according to cardiology note from [facility] scanned under Media tab, myocardial biopsy was negative for amyloid '12 and had cardiac radiofrequency ablation '11).  EP cardiologist Sanda Klein, MD has referred pt to Dr. Cristopher Peru for evaluation of whether or not to reimplant ICD  Pulmonologist is Simonne Maffucci, MD. Last office visit 10/01/17 with Eric Form, NP   LABS: Will be obtained day of surgery  - H/H 8.5/27.9 on 09/26/17  IMAGES:  CXR 09/24/17: Vascular congestion and mild cardiomegaly; lungs remain grossly clear.   EKG 09/23/17: Sinus rhythm. Low voltage, extremity and precordial leads. Prolonged QT interval   CV:  Echo 08/18/17: Study Conclusions - Left ventricle: The cavity size was normal. Wall thickness was increased in a pattern of mild LVH. Systolic function was normal. The estimated ejection fraction was in the range of 60% to 65%. Wall motion was normal; there were no regional wall motion abnormalities. Doppler parameters are consistent with abnormal left ventricular relaxation (grade 1 diastolic dysfunction). The E/e&' ratio is between 8-15, suggesting indeterminate LV filling pressure. - Ventricular septum: Septal motion showed abnormal function and dyssynergy. The contour showed diastolic flattening and systolic flattening. - Aortic valve: Moderate calcified aortic stenosis. Mean gradient (S): 25 mm Hg. Peak gradient (S): 40 mm Hg. Valve area (VTI): 1.15 cm^2. Valve area (Vmax): 1.11 cm^2. Valve area (Vmean): 1.16cm^2. - Mitral valve: Calcified annulus. Mildly thickened leaflets . Mild to moderate mitral stenosis. Mean gradient (D): 8 mm Hg. Valve area by continuity equation (using LVOT flow): 1.25 cm^2. - Left atrium: The atrium was mildly dilated. -  Right ventricle: The cavity size was moderately dilated. - Right atrium: Severely dilated. - Tricuspid valve: There was moderate regurgitation. - Pulmonary arteries: PA  peak pressure: 59 mm Hg (S). - Inferior vena cava: The vessel was dilated. The respirophasic diameter changes were blunted (<50%), consistent with elevated central venous pressure. Impressions: - Compared to a recent echo in 08/2016, the RVSP is slightly lower at 59 mmHg. There is RV pressure and volume overload. There is also moderate aortic stenosis with a mean gradient of 25 mmHg, moderate RVE and severe RAE, the IVC is dilated. No major valvular vegetations, however, endocarditis cannot be excluded on the basis of this study. (I don't see any report from a 08/20/17. Appears to be listed as a part of the procedure when ICD removed. ID had recommended removal of ICD regardless of if a vegetation was seen by TEE or not.)   RHC 08/15/15 ([place]. Have to click on results section within note): Component Name Value Ref Range  Cardiac Index (l/min/m2) 3.7 L/min/m2  Right Atrium Mean Pressure (mmHg) 5 mmHg   Right Ventricle Systolic Pressure (mmHg) 46 mmHg   Pulmonary Artery Mean Pressure (mmHg) 31 mmHg   Pulmonary Wedge Pressure (mmHg) 11 mmHg   Pulmonary Vascular Resistance (Wood units) 2.2 Wood units    6 min walk test 07/18/15 ([place]): Physician Interp: Six minute walk distance is mildly below the normal predicted  range. Oxygenation during exercise was adequate (SpO2 =>90%) without the  use of supplemental O2. At peak exercise, the heart rate indicated cardiovascular  maximums were being approached. Perceived dyspnea at end of exercise was absent or mild.  Carotid U/S 05/10/15:Impression: Heterogeneous calcified plaque shadowing, bilaterally. 1-39% right ICA stenosis, higher velocities could be of secured by shadowing. 40-59% left ICA stenosis, higher velocities could be at her by shadowing. 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 Normal subclavian arteries bilaterally. Patent vertebral arteries with antegrade flow.  Stress echo 01/06/08 ([place]): Interpretation: Normal stress echocardiogram.      Past Medical History:  Diagnosis Date  . AICD (automatic cardioverter/defibrillator) present    Medtronic   . Aortic stenosis    moderate AS by 08/2016 echo  . Arthritis    "knees" (08/05/2016)  . Atrial fibrillation ([place])   . ESRD (end stage renal disease) on dialysis ([place])    "M, T, W, T, F; NX stage hemodialysis; I do it at home" (08/05/2016)  . Great toe amputation status ([place])    status post left hallux amputation 08/01/14  . History of blood transfusion 2009   "S/P biopsy for prostate cancer check"  . Hypertension   . Hypothyroidism (acquired)   . Mitral stenosis    moderate mitral stenosis  . Nonischemic cardiomyopathy ([place])   . Pneumonia 2014; 08/05/2016  . PONV (postoperative nausea and vomiting)   . Pulmonary HTN ([place])   . Renal insufficiency   . Thyroid cancer ([place]) 2011    Past Surgical History:  Procedure Laterality Date  . ARTERIAL LINE INSERTION Right 08/24/2017   Procedure: ARTERIAL LINE INSERTION INTO RIGHT FEMORAL ARTERY;  Surgeon: Conrad [place]land, MD;  Location: [facility];  Service: Vascular;  Laterality: Right;  . BIOPSY  09/25/2017   Procedure: BIOPSY;  Surgeon: Ronnette Juniper, MD;  Location: [facility] ENDOSCOPY;  Service: Gastroenterology;;  . CARDIAC CATHETERIZATION Right 2017  . CARDIAC DEFIBRILLATOR PLACEMENT  2009  . DIALYSIS FISTULA CREATION Left 2003   "upper arm"  . DIALYSIS/PERMA CATHETER REMOVAL Left 09/01/2017  Procedure: DIALYSIS/PERMA CATHETER REMOVAL;  Surgeon: Conrad [place], MD;  Location: [facility];  Service: Vascular;  Laterality: Left;  . ESOPHAGOGASTRODUODENOSCOPY (EGD) WITH PROPOFOL N/A 09/25/2017   Procedure: ESOPHAGOGASTRODUODENOSCOPY (EGD) WITH PROPOFOL;  Surgeon: Ronnette Juniper, MD;  Location: [place];  Service: Gastroenterology;  Laterality: N/A;  . HERNIA REPAIR    . ICD LEAD REMOVAL Right 08/20/2017   Procedure: ICD EXTRACTION WITH C-ARM;  Surgeon: Evans Lance, MD;  Location: [facility]  OR;  Service: Cardiovascular;  Laterality: Right;  DR VAN TRIGT TO BACK-UP  . INSERTION OF DIALYSIS CATHETER N/A 11/11/2016   Procedure: INSERTION OF TUNNELED DIALYSIS CATHETER;  Surgeon: Angelia Mould, MD;  Location: [facility];  Service: Vascular;  Laterality: N/A;  . INSERTION OF DIALYSIS CATHETER Left 08/24/2017   Procedure: INSERTION OF DIALYSIS CATHETER;  Surgeon: Conrad [place], MD;  Location: [facility];  Service: Vascular;  Laterality: Left;  . INSERTION OF DIALYSIS CATHETER Left 09/01/2017   Procedure: INSERTION OF DIALYSIS CATHETER;  Surgeon: Conrad [place], MD;  Location: [place];  Service: Vascular;  Laterality: Left;  . KIDNEY TRANSPLANT  2009  . KNEE SURGERY Bilateral 2000s   "drained fluid"  . LAPAROSCOPIC GASTRIC BANDING  2006  . PROSTATE BIOPSY  2009  . REVISON OF ARTERIOVENOUS FISTULA Left 6/73/4193   Procedure: PLICATION OF ARTERIOVENOUS FISTULA ANEURYSM;  Surgeon: Elam Dutch, MD;  Location: [facility];  Service: Vascular;  Laterality: Left;  . REVISON OF ARTERIOVENOUS FISTULA Left 11/10/2016   Procedure: REVISON OF ARTERIOVENOUS FISTULA - LEFT UPPER ARM;  Surgeon: Elam Dutch, MD;  Location: [facility];  Service: Vascular;  Laterality: Left;  . REVISON OF ARTERIOVENOUS FISTULA Left 08/24/2017   Procedure: REVISON OF ARTERIOVENOUS FISTULA LEFT;  Surgeon: Conrad [place], MD;  Location: [facility];  Service: Vascular;  Laterality: Left;  . TEE WITHOUT CARDIOVERSION N/A 08/20/2017   Procedure: TRANSESOPHAGEAL ECHOCARDIOGRAM (TEE);  Surgeon: Evans Lance, MD;  Location: [facility];  Service: Cardiovascular;  Laterality: N/A;  . THROMBECTOMY W/ EMBOLECTOMY Left 12/25/2014   Procedure: THROMBECTOMY ARTERIOVENOUS FISTULA;  Surgeon: Elam Dutch, MD;  Location: [place];  Service: Vascular;  Laterality: Left;  . THYROIDECTOMY  2011  . TOE AMPUTATION Bilateral     right 1st and  2nd digits; left 1st and 3rd digits"  . UMBILICAL HERNIA REPAIR  X 2  . VEIN HARVEST Left 08/24/2017   Procedure: VEIN  HARVEST;  Surgeon: Conrad , MD;  Location: [facility] OR;  Service: Vascular;  Laterality: Left;    MEDICATIONS: No current facility-administered medications for this encounter.    Marland Kitchen ambrisentan (LETAIRIS) 10 MG tablet  . amiodarone (PACERONE) 200 MG tablet  . atorvastatin (LIPITOR) 40 MG tablet  . calcium acetate (PHOSLO) 667 MG capsule  . cinacalcet (SENSIPAR) 30 MG tablet  . levothyroxine (SYNTHROID, LEVOTHROID) 300 MCG tablet  . midodrine (PROAMATINE) 10 MG tablet  . multivitamin (RENA-VIT) TABS tablet  . pantoprazole (PROTONIX) 40 MG tablet  . sucralfate (CARAFATE) 1 g tablet  . tadalafil, PAH, (ADCIRCA) 20 MG tablet  . warfarin (COUMADIN) 6 MG tablet    If labs acceptable day of surgery, I anticipate pt can proceed with surgery as scheduled.  Willeen Cass, FNP-BC [facility]/Anesthesiology Phone: [phone] 10/12/2017 12:31 PM

## 2017-10-12 NOTE — Anesthesia Preprocedure Evaluation (Addendum)
Anesthesia Evaluation    History of Anesthesia Complications (+) PONV and DIFFICULT IV STICK / SPECIAL LINE  Airway Mallampati: II  TM Distance: >3 FB Neck ROM: Full    Dental  (+) Dental Advisory Given, Edentulous Upper   Pulmonary    breath sounds clear to auscultation       Cardiovascular hypertension, Pt. on medications +CHF (pulmonary HTN)  + dysrhythmias Atrial Fibrillation + Cardiac Defibrillator  Rhythm:Regular Rate:Normal + Systolic murmurs 8/42 ECHO: EF 60-65%, mod AS with peak grad 40 mmHg, mean grad 25 mmHg, mod MS with peak grad 9 mmHg, mod TR, pulm HTN with PA pressure 65m Hg   Neuro/Psych    GI/Hepatic GERD  Medicated,  Endo/Other  Hypothyroidism Obesity  Renal/GU ESRF and DialysisRenal diseaseS/p renal transplant     Musculoskeletal  (+) Arthritis ,   Abdominal   Peds  Hematology  (+) anemia , coumadin   Anesthesia Other Findings   Reproductive/Obstetrics                           Anesthesia Physical Anesthesia Plan  ASA: IV  Anesthesia Plan: MAC   Post-op Pain Management:    Induction: Intravenous  PONV Risk Score and Plan: Propofol infusion and Treatment may vary due to age or medical condition  Airway Management Planned: Nasal Cannula and Natural Airway  Additional Equipment: None  Intra-op Plan:   Post-operative Plan:   Informed Consent: I have reviewed the patients History and Physical, chart, labs and discussed the procedure including the risks, benefits and alternatives for the proposed anesthesia with the patient or authorized representative who has indicated his/her understanding and acceptance.     Plan Discussed with: CRNA and Anesthesiologist  Anesthesia Plan Comments:         Anesthesia Quick Evaluation

## 2017-10-12 NOTE — Progress Notes (Signed)
   10/12/17 1140  OBSTRUCTIVE SLEEP APNEA  Have you ever been diagnosed with sleep apnea through a sleep study? No  Do you snore loudly (loud enough to be heard through closed doors)?  0  Do you often feel tired, fatigued, or sleepy during the daytime (such as falling asleep during driving or talking to someone)? 0  Has anyone observed you stop breathing during your sleep? 0  Do you have, or are you being treated for high blood pressure? 1  BMI more than 35 kg/m2? 1  Age > 50 (1-yes) 1  Neck circumference greater than:Male 16 inches or larger, Male 17inches or larger? 1  Male Gender (Yes=1) 1  Obstructive Sleep Apnea Score 5

## 2017-10-13 ENCOUNTER — Ambulatory Visit (HOSPITAL_COMMUNITY): Payer: Medicare HMO | Admitting: Emergency Medicine

## 2017-10-13 ENCOUNTER — Encounter (HOSPITAL_COMMUNITY): Payer: Self-pay | Admitting: Anesthesiology

## 2017-10-13 ENCOUNTER — Other Ambulatory Visit: Payer: Self-pay

## 2017-10-13 ENCOUNTER — Telehealth: Payer: Self-pay | Admitting: Vascular Surgery

## 2017-10-13 ENCOUNTER — Encounter (HOSPITAL_COMMUNITY)
Admission: RE | Disposition: A | Discharge status: Home / Self Care | Payer: Self-pay | Source: Ambulatory Visit | Attending: Vascular Surgery

## 2017-10-13 ENCOUNTER — Ambulatory Visit (HOSPITAL_COMMUNITY)
Admission: RE | Admit: 2017-10-13 | Discharge: 2017-10-13 | Disposition: A | Discharge status: Home / Self Care | Payer: Medicare HMO | Source: Ambulatory Visit | Attending: Vascular Surgery | Admitting: Vascular Surgery

## 2017-10-13 DIAGNOSIS — Z79890 Hormone replacement therapy (postmenopausal): Secondary | ICD-10-CM | POA: Insufficient documentation

## 2017-10-13 DIAGNOSIS — Z95810 Presence of automatic (implantable) cardiac defibrillator: Secondary | ICD-10-CM | POA: Diagnosis not present

## 2017-10-13 DIAGNOSIS — Z89412 Acquired absence of left great toe: Secondary | ICD-10-CM | POA: Diagnosis not present

## 2017-10-13 DIAGNOSIS — I132 Hypertensive heart and chronic kidney disease with heart failure and with stage 5 chronic kidney disease, or end stage renal disease: Secondary | ICD-10-CM | POA: Diagnosis not present

## 2017-10-13 DIAGNOSIS — Z79899 Other long term (current) drug therapy: Secondary | ICD-10-CM | POA: Diagnosis not present

## 2017-10-13 DIAGNOSIS — E890 Postprocedural hypothyroidism: Secondary | ICD-10-CM | POA: Diagnosis not present

## 2017-10-13 DIAGNOSIS — I4891 Unspecified atrial fibrillation: Secondary | ICD-10-CM | POA: Insufficient documentation

## 2017-10-13 DIAGNOSIS — Z9884 Bariatric surgery status: Secondary | ICD-10-CM | POA: Insufficient documentation

## 2017-10-13 DIAGNOSIS — Z992 Dependence on renal dialysis: Secondary | ICD-10-CM | POA: Insufficient documentation

## 2017-10-13 DIAGNOSIS — N186 End stage renal disease: Secondary | ICD-10-CM | POA: Diagnosis not present

## 2017-10-13 DIAGNOSIS — N185 Chronic kidney disease, stage 5: Secondary | ICD-10-CM | POA: Diagnosis not present

## 2017-10-13 DIAGNOSIS — E039 Hypothyroidism, unspecified: Secondary | ICD-10-CM | POA: Diagnosis not present

## 2017-10-13 DIAGNOSIS — Z940 Kidney transplant status: Secondary | ICD-10-CM | POA: Diagnosis not present

## 2017-10-13 DIAGNOSIS — I509 Heart failure, unspecified: Secondary | ICD-10-CM | POA: Diagnosis not present

## 2017-10-13 DIAGNOSIS — Z8546 Personal history of malignant neoplasm of prostate: Secondary | ICD-10-CM | POA: Insufficient documentation

## 2017-10-13 DIAGNOSIS — E669 Obesity, unspecified: Secondary | ICD-10-CM | POA: Diagnosis not present

## 2017-10-13 DIAGNOSIS — I272 Pulmonary hypertension, unspecified: Secondary | ICD-10-CM | POA: Insufficient documentation

## 2017-10-13 DIAGNOSIS — Z85850 Personal history of malignant neoplasm of thyroid: Secondary | ICD-10-CM | POA: Insufficient documentation

## 2017-10-13 DIAGNOSIS — Z6837 Body mass index (BMI) 37.0-37.9, adult: Secondary | ICD-10-CM | POA: Insufficient documentation

## 2017-10-13 HISTORY — PX: BASCILIC VEIN TRANSPOSITION: SHX5742

## 2017-10-13 LAB — POCT I-STAT 4, (NA,K, GLUC, HGB,HCT)
GLUCOSE: 72 mg/dL (ref 65–99)
HEMATOCRIT: 29 % — AB (ref 39.0–52.0)
HEMOGLOBIN: 9.9 g/dL — AB (ref 13.0–17.0)
POTASSIUM: 3.3 mmol/L — AB (ref 3.5–5.1)
SODIUM: 137 mmol/L (ref 135–145)

## 2017-10-13 LAB — PROTIME-INR
INR: 1.22
Prothrombin Time: 15.3 seconds — ABNORMAL HIGH (ref 11.4–15.2)

## 2017-10-13 SURGERY — TRANSPOSITION, VEIN, BASILIC
Anesthesia: Monitor Anesthesia Care | Site: Arm Upper | Laterality: Right

## 2017-10-13 MED ORDER — SODIUM CHLORIDE 0.9 % IV SOLN: INTRAVENOUS | Status: DC

## 2017-10-13 MED ORDER — 0.9 % SODIUM CHLORIDE (POUR BTL) OPTIME
TOPICAL | Status: DC | PRN
  Administered 2017-10-13: 1000 mL

## 2017-10-13 MED ORDER — MIDAZOLAM HCL 2 MG/2ML IJ SOLN: 0.5000 mg | Freq: Once | INTRAMUSCULAR | Status: DC | PRN

## 2017-10-13 MED ORDER — PROMETHAZINE HCL 25 MG/ML IJ SOLN: 6.2500 mg | INTRAMUSCULAR | Status: DC | PRN

## 2017-10-13 MED ORDER — ONDANSETRON HCL 4 MG/2ML IJ SOLN
INTRAMUSCULAR | Status: DC | PRN
  Administered 2017-10-13: 4 mg via INTRAVENOUS

## 2017-10-13 MED ORDER — SODIUM CHLORIDE 0.9 % IV SOLN
INTRAVENOUS | Status: AC
  Filled 2017-10-13: qty 1.2

## 2017-10-13 MED ORDER — HEPARIN SODIUM (PORCINE) 1000 UNIT/ML IJ SOLN
1900.0000 [IU] | Freq: Once | INTRAMUSCULAR | Status: AC
  Administered 2017-10-13: 1900 [IU] via INTRAVENOUS
  Filled 2017-10-13: qty 1.9

## 2017-10-13 MED ORDER — PROPOFOL 10 MG/ML IV BOLUS
INTRAVENOUS | Status: AC
  Filled 2017-10-13: qty 20

## 2017-10-13 MED ORDER — MIDAZOLAM HCL 2 MG/2ML IJ SOLN
INTRAMUSCULAR | Status: AC
  Filled 2017-10-13: qty 2

## 2017-10-13 MED ORDER — EPHEDRINE SULFATE 50 MG/ML IJ SOLN
INTRAMUSCULAR | Status: DC | PRN
  Administered 2017-10-13 (×2): 5 mg via INTRAVENOUS

## 2017-10-13 MED ORDER — CHLORHEXIDINE GLUCONATE 4 % EX LIQD: 60.0000 mL | Freq: Once | CUTANEOUS | Status: DC

## 2017-10-13 MED ORDER — PHENYLEPHRINE HCL 10 MG/ML IJ SOLN
INTRAVENOUS | Status: DC | PRN
  Administered 2017-10-13: 50 ug/min via INTRAVENOUS

## 2017-10-13 MED ORDER — PROPOFOL 500 MG/50ML IV EMUL
INTRAVENOUS | Status: DC | PRN
  Administered 2017-10-13: 75 ug/kg/min via INTRAVENOUS

## 2017-10-13 MED ORDER — MIDAZOLAM HCL 5 MG/5ML IJ SOLN
INTRAMUSCULAR | Status: DC | PRN
  Administered 2017-10-13: 2 mg via INTRAVENOUS

## 2017-10-13 MED ORDER — SODIUM CHLORIDE 0.9 % IV SOLN
INTRAVENOUS | Status: DC | PRN
  Administered 2017-10-13: 07:00:00

## 2017-10-13 MED ORDER — OXYCODONE-ACETAMINOPHEN 5-325 MG PO TABS: 1.0000 | ORAL_TABLET | Freq: Four times a day (QID) | ORAL | 0 refills | Status: DC | PRN

## 2017-10-13 MED ORDER — MEPERIDINE HCL 50 MG/ML IJ SOLN: 6.2500 mg | INTRAMUSCULAR | Status: DC | PRN

## 2017-10-13 MED ORDER — LIDOCAINE HCL (PF) 1 % IJ SOLN
INTRAMUSCULAR | Status: DC | PRN
  Administered 2017-10-13: 30 mL

## 2017-10-13 MED ORDER — FENTANYL CITRATE (PF) 100 MCG/2ML IJ SOLN: 25.0000 ug | INTRAMUSCULAR | Status: DC | PRN

## 2017-10-13 MED ORDER — FENTANYL CITRATE (PF) 250 MCG/5ML IJ SOLN
INTRAMUSCULAR | Status: AC
  Filled 2017-10-13: qty 5

## 2017-10-13 MED ORDER — LIDOCAINE HCL (PF) 1 % IJ SOLN
INTRAMUSCULAR | Status: AC
  Filled 2017-10-13: qty 30

## 2017-10-13 MED ORDER — FENTANYL CITRATE (PF) 100 MCG/2ML IJ SOLN
INTRAMUSCULAR | Status: DC | PRN
  Administered 2017-10-13: 50 ug via INTRAVENOUS

## 2017-10-13 MED ORDER — SODIUM CHLORIDE 0.9 % IV SOLN
INTRAVENOUS | Status: DC | PRN
  Administered 2017-10-13: 07:00:00 via INTRAVENOUS

## 2017-10-13 SURGICAL SUPPLY — 41 items
ARMBAND PINK RESTRICT EXTREMIT (MISCELLANEOUS) ×2 IMPLANT
CANISTER SUCT 3000ML PPV (MISCELLANEOUS) ×2 IMPLANT
CLIP VESOCCLUDE MED 6/CT (CLIP) ×2 IMPLANT
CLIP VESOCCLUDE SM WIDE 6/CT (CLIP) ×2 IMPLANT
CORDS BIPOLAR (ELECTRODE) IMPLANT
COVER PROBE W GEL 5X96 (DRAPES) ×2 IMPLANT
DECANTER SPIKE VIAL GLASS SM (MISCELLANEOUS) ×2 IMPLANT
DERMABOND ADVANCED (GAUZE/BANDAGES/DRESSINGS) ×1
DERMABOND ADVANCED .7 DNX12 (GAUZE/BANDAGES/DRESSINGS) ×1 IMPLANT
ELECT REM PT RETURN 9FT ADLT (ELECTROSURGICAL) ×2
ELECTRODE REM PT RTRN 9FT ADLT (ELECTROSURGICAL) ×1 IMPLANT
GLOVE BIO SURGEON STRL SZ 6.5 (GLOVE) ×4 IMPLANT
GLOVE BIO SURGEON STRL SZ7 (GLOVE) ×2 IMPLANT
GLOVE BIOGEL PI IND STRL 6.5 (GLOVE) ×4 IMPLANT
GLOVE BIOGEL PI IND STRL 7.0 (GLOVE) ×1 IMPLANT
GLOVE BIOGEL PI IND STRL 7.5 (GLOVE) ×2 IMPLANT
GLOVE BIOGEL PI INDICATOR 6.5 (GLOVE) ×4
GLOVE BIOGEL PI INDICATOR 7.0 (GLOVE) ×1
GLOVE BIOGEL PI INDICATOR 7.5 (GLOVE) ×2
GLOVE ECLIPSE 6.5 STRL STRAW (GLOVE) ×2 IMPLANT
GLOVE ECLIPSE 7.0 STRL STRAW (GLOVE) ×2 IMPLANT
GOWN STRL REUS W/ TWL LRG LVL3 (GOWN DISPOSABLE) ×4 IMPLANT
GOWN STRL REUS W/TWL LRG LVL3 (GOWN DISPOSABLE) ×4
HEMOSTAT SPONGE AVITENE ULTRA (HEMOSTASIS) IMPLANT
KIT BASIN OR (CUSTOM PROCEDURE TRAY) ×2 IMPLANT
KIT TURNOVER KIT B (KITS) ×2 IMPLANT
NEEDLE HYPO 25GX1X1/2 BEV (NEEDLE) ×2 IMPLANT
NS IRRIG 1000ML POUR BTL (IV SOLUTION) ×2 IMPLANT
PACK CV ACCESS (CUSTOM PROCEDURE TRAY) ×2 IMPLANT
PAD ARMBOARD 7.5X6 YLW CONV (MISCELLANEOUS) ×4 IMPLANT
SUT MNCRL AB 4-0 PS2 18 (SUTURE) ×2 IMPLANT
SUT PROLENE 6 0 BV (SUTURE) ×4 IMPLANT
SUT PROLENE 7 0 BV 1 (SUTURE) ×2 IMPLANT
SUT SILK 2 0 SH (SUTURE) IMPLANT
SUT VIC AB 2-0 CT1 27 (SUTURE)
SUT VIC AB 2-0 CT1 TAPERPNT 27 (SUTURE) IMPLANT
SUT VIC AB 3-0 SH 27 (SUTURE) ×1
SUT VIC AB 3-0 SH 27X BRD (SUTURE) ×1 IMPLANT
TOWEL GREEN STERILE (TOWEL DISPOSABLE) ×2 IMPLANT
UNDERPAD 30X30 (UNDERPADS AND DIAPERS) ×2 IMPLANT
WATER STERILE IRR 1000ML POUR (IV SOLUTION) ×2 IMPLANT

## 2017-10-13 NOTE — Anesthesia Postprocedure Evaluation (Signed)
Anesthesia Post Note  Patient: Marley Charlot.  Procedure(s) Performed: FIRST STAGE BRACHIAL VEIN TRANSPOSITION RIGHT ARM (Right Arm Upper)     Patient location during evaluation: PACU Anesthesia Type: MAC Level of consciousness: awake and alert, oriented and patient cooperative Pain management: pain level controlled Vital Signs Assessment: post-procedure vital signs reviewed and stable Respiratory status: spontaneous breathing, nonlabored ventilation and respiratory function stable Cardiovascular status: blood pressure returned to baseline and stable Postop Assessment: no apparent nausea or vomiting Anesthetic complications: no    Last Vitals:  Vitals:   10/13/17 0944 10/13/17 0946  BP:  (!) 91/54  Pulse: 62 63  Resp:    Temp:  (!) 36.4 C  SpO2: 97% 96%    Last Pain:  Vitals:   10/13/17 0900  TempSrc:   PainSc: 0-No pain                 Ashling Roane,E. Thedore Pickel

## 2017-10-13 NOTE — Transfer of Care (Signed)
Immediate Anesthesia Transfer of Care Note  Patient: Adam [name].  Procedure(s) Performed: FIRST STAGE BRACHIAL VEIN TRANSPOSITION RIGHT ARM (Right Arm Upper)  Patient Location: PACU  Anesthesia Type:MAC  Level of Consciousness: awake, alert  and oriented  Airway & Oxygen Therapy: Patient Spontanous Breathing and Patient connected to nasal cannula oxygen  Post-op Assessment: Report given to RN, Post -op Vital signs reviewed and stable and Patient moving all extremities X 4  Post vital signs: Reviewed and stable  Last Vitals:  Vitals Value Taken Time  BP    Temp    Pulse 66 10/13/2017  8:55 AM  Resp 17 10/13/2017  8:55 AM  SpO2 99 % 10/13/2017  8:55 AM  Vitals shown include unvalidated device data.  Last Pain:  Vitals:   10/13/17 0623  TempSrc: Oral  PainSc:       Patients Stated Pain Goal: 2 (20/25/42 7062)  Complications: No apparent anesthesia complications

## 2017-10-13 NOTE — Telephone Encounter (Signed)
sch appt spk to pt 11/25/17 2pm

## 2017-10-13 NOTE — Op Note (Addendum)
OPERATIVE NOTE   PROCEDURE: 1. right first stage brachial vein transposition (brachiobrachial arteriovenous fistula) placement  PRE-OPERATIVE DIAGNOSIS: end stage renal disease  POST-OPERATIVE DIAGNOSIS: same as above   SURGEON: Adele Barthel, MD  ASSISTANT(S): Laurence Slate, PAC   ANESTHESIA: local and MAC  ESTIMATED BLOOD LOSS: 30 cc  FINDINGS: 1.  Brachial vein: 4 mm, acceptable 2.  Brachial artery: 3.5 mm, calcific atherosclerotic wall with significant eccentric plaque 3.  Venous outflow: palpable thrill  4.  Radial flow: faintly palpable radial pulse  SPECIMEN(S):  none  INDICATIONS:   Adam [name]. is a 57 y.o. male who presents with end stage renal disease.  The patient is scheduled for right first stage brachial vein transposition.  The patient is aware the risks include but are not limited to: bleeding, infection, steal syndrome, nerve damage, ischemic monomelic neuropathy, failure to mature, and need for additional procedures.  The patient is aware of the risks of the procedure and elects to proceed forward.  DESCRIPTION: After full informed written consent was obtained from the patient, the patient was brought back to the operating room and placed supine upon the operating table.  Prior to induction, the patient received IV antibiotics.   After obtaining adequate anesthesia, the patient was then prepped and draped in the standard fashion for a right arm access procedure.    I turned my attention first to identifying the patient's brachial vein and brachial artery.  Using SonoSite guidance, the location of these vessels were marked out on the skin.   At this point, I injected local anesthetic to obtain a field block of the antecubitum.  In total, I injected about 5 mL of 1% lidocaine without epinephrine.  I made a longitudinal incision at the level of the antecubitum and dissected through the subcutaneous tissue and fascia to gain exposure of the brachial artery.   This was noted to be 3.5 mm in diameter externally.  This was dissected out proximally and distally and controlled with vessel loops .  I then dissected out the brachial vein.  This was noted to be 4 mm in diameter externally.  The distal segment of the vein was ligated with a  2-0 silk, and the vein was transected.  The proximal segment was interrogated with serial dilators.  The vein accepted up to a 4 mm dilator without any difficulty.  I then instilled the heparinized saline into the vein and clamped it.  At this point, I reset my exposure of the brachial artery and placed the artery under tension proximally and distally.  I made an arteriotomy with a #11 blade, and then I extended the arteriotomy with a Potts scissor.  I injected heparinized saline proximal and distal to this arteriotomy.  The vein was then sewn to the artery in an end-to-side configuration with a running stitch of 7-0 Prolene.  Prior to completing this anastomosis, I allowed the vein and artery to backbleed.  There was no evidence of clot from any vessels.  I completed the anastomosis in the usual fashion and then released all vessel loops and clamps.    There was a palpable thrill in the venous outflow, and there was a faintly palpable radial pulse.   At this point, I irrigated out the surgical wound.  There was no further active bleeding.  The subcutaneous tissue was reapproximated with a running stitch of 3-0 Vicryl.  The skin was then reapproximated with a running subcuticular stitch of 4-0 Vicryl.  The  skin was then cleaned, dried, and reinforced with Liquidband.  The patient tolerated this procedure well.   COMPLICATIONS: none  CONDITION: stable   Adele Barthel, MD, [facility] [place] Office: [phone] Pager: [phone]  10/13/2017, 8:45 AM

## 2017-10-13 NOTE — Discharge Instructions (Signed)
° °  [facility] [place] ° °Discharge Instructions ° °AV Fistula or Graft Surgery for Dialysis Access ° °Please refer to the following instructions for your post-procedure care. Your surgeon or physician assistant will discuss any changes with you. ° °Activity ° °You may drive the day following your surgery, if you are comfortable and no longer taking prescription pain medication. Resume full activity as the soreness in your incision resolves. ° °Bathing/Showering ° °You may shower after you go home. Keep your incision dry for 48 hours. Do not soak in a bathtub, hot tub, or swim until the incision heals completely. You may not shower if you have a hemodialysis catheter. ° °Incision Care ° °Clean your incision with mild soap and water after 48 hours. Pat the area dry with a clean towel. You do not need a bandage unless otherwise instructed. Do not apply any ointments or creams to your incision. You may have skin glue on your incision. Do not peel it off. It will come off on its own in about one week. Your arm may swell a bit after surgery. To reduce swelling use pillows to elevate your arm so it is above your heart. Your doctor will tell you if you need to lightly wrap your arm with an ACE bandage. ° °Diet ° °Resume your normal diet. There are not special food restrictions following this procedure. In order to heal from your surgery, it is CRITICAL to get adequate nutrition. Your body requires vitamins, minerals, and protein. Vegetables are the best source of vitamins and minerals. Vegetables also provide the perfect balance of protein. Processed food has little nutritional value, so try to avoid this. ° °Medications ° °Resume taking all of your medications. If your incision is causing pain, you may take over-the counter pain relievers such as acetaminophen (Tylenol). If you were prescribed a stronger pain medication, please be aware these medications can cause nausea and constipation. Prevent  nausea by taking the medication with a snack or meal. Avoid constipation by drinking plenty of fluids and eating foods with high amount of fiber, such as fruits, vegetables, and grains. Do not take Tylenol if you are taking prescription pain medications. ° ° ° ° °Follow up °Your surgeon may want to see you in the office following your access surgery. If so, this will be arranged at the time of your surgery. ° °Please call us immediately for any of the following conditions: ° °Increased pain, redness, drainage (pus) from your incision site °Fever of 101 degrees or higher °Severe or worsening pain at your incision site °Hand pain or numbness. ° °Reduce your risk of vascular disease: ° °Stop smoking. If you would like help, call [facility] at 1-800-QUIT-NOW (1-800-784-8669) or [place] at 336-586-4000 ° °Manage your cholesterol °Maintain a desired weight °Control your diabetes °Keep your blood pressure down ° °Dialysis ° °It will take several weeks to several months for your new dialysis access to be ready for use. Your surgeon will determine when it is OK to use it. Your nephrologist will continue to direct your dialysis. You can continue to use your Permcath until your new access is ready for use. ° °If you have any questions, please call the office at [phone]. ° °

## 2017-10-13 NOTE — Interval H&P Note (Signed)
   History and Physical Update  The patient was interviewed and re-examined.  The patient's previous History and Physical has been reviewed and is unchanged from my consult .  There is no change in the plan of care: Right first stage brachial vein transposition.   Risk, benefits, and alternatives to access surgery were discussed.    The patient is aware the risks include but are not limited to: bleeding, infection, steal syndrome, nerve damage, ischemic monomelic neuropathy, thrombosis, failure to mature, complications related to venous hypertension, need for additional procedures, death and stroke.    The patient knows this procedure requires a second operation to complete the transposition once the fistula has matured.  The patient agrees to proceed forward with the procedure.   Adele Barthel, MD, FACS [facility] [place] Office: [phone] Pager: [phone]  10/13/2017, 7:08 AM

## 2017-10-14 ENCOUNTER — Encounter (HOSPITAL_COMMUNITY): Payer: Self-pay | Admitting: Vascular Surgery

## 2017-10-16 ENCOUNTER — Telehealth: Payer: Self-pay | Admitting: Cardiology

## 2017-10-16 ENCOUNTER — Other Ambulatory Visit: Payer: Self-pay | Admitting: Gastroenterology

## 2017-10-16 DIAGNOSIS — D689 Coagulation defect, unspecified: Secondary | ICD-10-CM | POA: Diagnosis not present

## 2017-10-16 DIAGNOSIS — N186 End stage renal disease: Secondary | ICD-10-CM | POA: Diagnosis not present

## 2017-10-16 NOTE — Telephone Encounter (Signed)
   [facility] Pre-operative Risk Assessment    Request for surgical clearance:  1. What type of surgery is being performed? Colonoscopy for colon cancer screening   2. When is this surgery scheduled? 12/22/17   3. What type of clearance is required (medical clearance vs. Pharmacy clearance to hold med vs. Both)? Both   4. Are there any medications that need to be held prior to surgery and how long? Warfarin - 7 days prior   5. Practice name and name of physician performing surgery? Dr. Therisa Doyne @ [facility]    6. What is your office phone number [phone]    7.   What is your office fax number [phone]  8.   Anesthesia type (None, local, MAC, general) ? Not specified    Adam [name] 10/16/2017, 3:28 PM  _________________________________________________________________   (provider comments below)

## 2017-10-16 NOTE — Telephone Encounter (Signed)
His colonoscopy has been rescheduled for mid August. He has an appointment with dr Percival Spanish later in June, pre op clearance can be addressed then.   Kerin Ransom PA-C 10/16/2017 3:43 PM

## 2017-10-17 ENCOUNTER — Other Ambulatory Visit: Payer: Self-pay | Admitting: Cardiovascular Disease

## 2017-10-18 ENCOUNTER — Encounter: Payer: Self-pay | Admitting: Vascular Surgery

## 2017-10-20 ENCOUNTER — Encounter: Payer: Self-pay | Admitting: Podiatry

## 2017-10-20 ENCOUNTER — Ambulatory Visit: Payer: Medicare HMO | Admitting: Family Medicine

## 2017-10-21 ENCOUNTER — Encounter: Payer: Self-pay | Admitting: Internal Medicine

## 2017-10-21 ENCOUNTER — Ambulatory Visit: Payer: Medicare HMO | Admitting: Internal Medicine

## 2017-10-21 ENCOUNTER — Encounter

## 2017-10-21 VITALS — BP 112/64 | HR 85 | Ht 73.0 in | Wt 277.0 lb

## 2017-10-21 DIAGNOSIS — I428 Other cardiomyopathies: Secondary | ICD-10-CM | POA: Diagnosis not present

## 2017-10-21 DIAGNOSIS — T827XXD Infection and inflammatory reaction due to other cardiac and vascular devices, implants and grafts, subsequent encounter: Secondary | ICD-10-CM | POA: Diagnosis not present

## 2017-10-21 NOTE — Patient Instructions (Addendum)
Medication Instructions:  Your physician recommends that you continue on your current medications as directed. Please refer to the Current Medication list given to you today.  Continue use of amiodarone at this time.  Labwork: None ordered.  Testing/Procedures: Your physician has requested that you have an echocardiogram. Echocardiography is a painless test that uses sound waves to create images of your heart. It provides your doctor with information about the size and shape of your heart and how well your heart's chambers and valves are working. This procedure takes approximately one hour. There are no restrictions for this procedure.  Please schedule for an ECHO in April of 2020/  Follow-Up: Your physician wants you to follow-up in: next April 2020 with Dr. Lovena Le.   You will receive a reminder letter in the mail two months in advance. If you don't receive a letter, please call our office to schedule the follow-up appointment.   Any Other Special Instructions Will Be Listed Below (If Applicable).  You may discontinue use of your Lifevest!  If you need a refill on your cardiac medications before your next appointment, please call your pharmacy.

## 2017-10-21 NOTE — Progress Notes (Addendum)
HPI [name] returns today for followup. He is a pleasant 57 yo man with ESRD on HD and an indwelling catheter. He had a CIED and underwent extraction by me back in April. He has worn a life vest since. His EF normalized. He denies fever or chills. He has a resolving hematoma. Allergies  Allergen Reactions  . Enalaprilat Swelling    Mouth swelling  . Vasotec [Enalapril] Swelling    MOUTH SWELLING  . Iodinated Diagnostic Agents Nausea And Vomiting and Nausea Only     Current Outpatient Medications  Medication Sig Dispense Refill  . ambrisentan (LETAIRIS) 10 MG tablet Take 1 tablet (10 mg total) by mouth daily. 30 tablet 11  . amiodarone (PACERONE) 200 MG tablet Take 200 mg by mouth every evening.     Adam [name] atorvastatin (LIPITOR) 40 MG tablet Take 1 tablet (40 mg total) by mouth every evening. 30 tablet 2  . calcium acetate (PHOSLO) 667 MG capsule Take 2 capsules (1,334 mg total) by mouth 3 (three) times daily with meals. 30 capsule 0  . cinacalcet (SENSIPAR) 30 MG tablet Take 3 tablets (90 mg total) by mouth every evening. 30 tablet 0  . levothyroxine (SYNTHROID, LEVOTHROID) 300 MCG tablet Take 1 tablet (300 mcg total) by mouth daily before breakfast. 90 tablet 3  . midodrine (PROAMATINE) 10 MG tablet TAKE 1 TABLET (10 MG TOTAL) BY MOUTH 3 (THREE) TIMES DAILY. 90 tablet 5  . multivitamin (RENA-VIT) TABS tablet Take 1 tablet by mouth at bedtime. 30 each 0  . pantoprazole (PROTONIX) 40 MG tablet Take 1 tablet (40 mg total) by mouth 2 (two) times daily before a meal. 60 tablet 0  . sucralfate (CARAFATE) 1 g tablet Take 1 tablet (1 g total) by mouth 2 (two) times daily. 60 tablet 0  . tadalafil (ADCIRCA/CIALIS) 20 MG tablet Take 20 mg by mouth daily.     Adam [name] warfarin (COUMADIN) 6 MG tablet TAKE 1 TO 1.5 TABLETS BY MOUTH DAILY AS DIRECTED BY COUMADIN CLINIC 45 tablet 2   No current facility-administered medications for this visit.      Past Medical History:  Diagnosis Date  . AICD  (automatic cardioverter/defibrillator) present    Medtronic   . Aortic stenosis    moderate AS by 08/2016 echo  . Arthritis    "knees" (08/05/2016)  . Atrial fibrillation ([place])   . ESRD (end stage renal disease) on dialysis ([place])    "M, T, W, T, F; NX stage hemodialysis; I do it at home" (08/05/2016)  . Great toe amputation status ([place])    status post left hallux amputation 08/01/14  . History of blood transfusion 2009   "S/P biopsy for prostate cancer check"  . Hypertension   . Hypothyroidism (acquired)   . Mitral stenosis    moderate mitral stenosis  . Nonischemic cardiomyopathy ([place])   . Pneumonia 2014; 08/05/2016  . PONV (postoperative nausea and vomiting)   . Pulmonary HTN ([place])   . Renal insufficiency   . Thyroid cancer ([place]) 2011    ROS:   All systems reviewed and negative except as noted in the HPI.   Past Surgical History:  Procedure Laterality Date  . ARTERIAL LINE INSERTION Right 08/24/2017   Procedure: ARTERIAL LINE INSERTION INTO RIGHT FEMORAL ARTERY;  Surgeon: Conrad Alberta, MD;  Location: [place];  Service: Vascular;  Laterality: Right;  . BASCILIC VEIN TRANSPOSITION Right 10/13/2017   Procedure: FIRST STAGE BRACHIAL VEIN TRANSPOSITION RIGHT ARM;  Surgeon:  Conrad [place], MD;  Location: [place];  Service: Vascular;  Laterality: Right;  . BIOPSY  09/25/2017   Procedure: BIOPSY;  Surgeon: Ronnette Juniper, MD;  Location: [facility] ENDOSCOPY;  Service: Gastroenterology;;  . CARDIAC CATHETERIZATION Right 2017  . CARDIAC DEFIBRILLATOR PLACEMENT  2009  . DIALYSIS FISTULA CREATION Left 2003   "upper arm"  . DIALYSIS/PERMA CATHETER REMOVAL Left 09/01/2017   Procedure: DIALYSIS/PERMA CATHETER REMOVAL;  Surgeon: Conrad Carbon, MD;  Location: [place];  Service: Vascular;  Laterality: Left;  . ESOPHAGOGASTRODUODENOSCOPY (EGD) WITH PROPOFOL N/A 09/25/2017   Procedure: ESOPHAGOGASTRODUODENOSCOPY (EGD) WITH PROPOFOL;  Surgeon: Ronnette Juniper, MD;  Location: [place];  Service: Gastroenterology;   Laterality: N/A;  . HERNIA REPAIR    . ICD LEAD REMOVAL Right 08/20/2017   Procedure: ICD EXTRACTION WITH C-ARM;  Surgeon: Evans Lance, MD;  Location: [facility] OR;  Service: Cardiovascular;  Laterality: Right;  DR VAN TRIGT TO BACK-UP  . icd removed    . INSERTION OF DIALYSIS CATHETER N/A 11/11/2016   Procedure: INSERTION OF TUNNELED DIALYSIS CATHETER;  Surgeon: Angelia Mould, MD;  Location: [place];  Service: Vascular;  Laterality: N/A;  . INSERTION OF DIALYSIS CATHETER Left 08/24/2017   Procedure: INSERTION OF DIALYSIS CATHETER;  Surgeon: Conrad Suffield [place], MD;  Location: [place];  Service: Vascular;  Laterality: Left;  . INSERTION OF DIALYSIS CATHETER Left 09/01/2017   Procedure: INSERTION OF DIALYSIS CATHETER;  Surgeon: Conrad Oasis, MD;  Location: [place];  Service: Vascular;  Laterality: Left;  . KIDNEY TRANSPLANT  2009  . KNEE SURGERY Bilateral 2000s   "drained fluid"  . LAPAROSCOPIC GASTRIC BANDING  2006  . PROSTATE BIOPSY  2009  . REVISON OF ARTERIOVENOUS FISTULA Left 08/19/7351   Procedure: PLICATION OF ARTERIOVENOUS FISTULA ANEURYSM;  Surgeon: Elam Dutch, MD;  Location: [place];  Service: Vascular;  Laterality: Left;  . REVISON OF ARTERIOVENOUS FISTULA Left 11/10/2016   Procedure: REVISON OF ARTERIOVENOUS FISTULA - LEFT UPPER ARM;  Surgeon: Elam Dutch, MD;  Location: [place];  Service: Vascular;  Laterality: Left;  . REVISON OF ARTERIOVENOUS FISTULA Left 08/24/2017   Procedure: REVISON OF ARTERIOVENOUS FISTULA LEFT;  Surgeon: Conrad [place] City, MD;  Location: [place];  Service: Vascular;  Laterality: Left;  . TEE WITHOUT CARDIOVERSION N/A 08/20/2017   Procedure: TRANSESOPHAGEAL ECHOCARDIOGRAM (TEE);  Surgeon: Evans Lance, MD;  Location: [place];  Service: Cardiovascular;  Laterality: N/A;  . THROMBECTOMY W/ EMBOLECTOMY Left 12/25/2014   Procedure: THROMBECTOMY ARTERIOVENOUS FISTULA;  Surgeon: Elam Dutch, MD;  Location: [place];  Service: Vascular;  Laterality: Left;  . THYROIDECTOMY   2011  . TOE AMPUTATION Bilateral     right 1st and  2nd digits; left 1st and 3rd digits"  . UMBILICAL HERNIA REPAIR  X 2  . VEIN HARVEST Left 08/24/2017   Procedure: VEIN HARVEST;  Surgeon: Conrad Lookingglass, MD;  Location: [facility] OR;  Service: Vascular;  Laterality: Left;     Family History  Problem Relation Age of Onset  . Heart failure Mother   . Hypertension Mother   . CAD Mother 52  . Emphysema Mother   . Hypertension Father   . Kidney failure Father      Social History   Socioeconomic History  . Marital status: Married    Spouse name: Not on file  . Number of children: 1  . Years of education: Not on file  . Highest education level: Not on file  Occupational History  .  Not on file  Social Needs  . Financial resource strain: Not on file  . Food insecurity:    Worry: Not on file    Inability: Not on file  . Transportation needs:    Medical: Not on file    Non-medical: Not on file  Tobacco Use  . Smoking status: Never Smoker  . Smokeless tobacco: Never Used  Substance and Sexual Activity  . Alcohol use: Yes    Alcohol/week: 0.0 oz    Comment: social  . Drug use: No  . Sexual activity: Yes  Lifestyle  . Physical activity:    Days per week: Not on file    Minutes per session: Not on file  . Stress: Not on file  Relationships  . Social connections:    Talks on phone: Not on file    Gets together: Not on file    Attends religious service: Not on file    Active member of club or organization: Not on file    Attends meetings of clubs or organizations: Not on file    Relationship status: Not on file  . Intimate partner violence:    Fear of current or ex partner: Not on file    Emotionally abused: Not on file    Physically abused: Not on file    Forced sexual activity: Not on file  Other Topics Concern  . Not on file  Social History Narrative   Retired Archivist.       BP 112/64   Pulse 85   Ht 6\' 1"  (1.854 m)   Wt 277 lb (125.6 kg)   BMI  36.55 kg/m   Physical Exam:  Well appearing but overweight middle aged man, NAD wearing a life vest HEENT: Unremarkable Neck:  6 cm JVD, no thyromegally Lymphatics:  No adenopathy Back:  No CVA tenderness Lungs:  Clear with no wheezes HEART:  Regular rate rhythm, no murmurs, no rubs, no clicks Abd:  soft, positive bowel sounds, no organomegally, no rebound, no guarding Ext:  2 plus pulses, no edema, no cyanosis, no clubbing Skin:  No rashes no nodules Neuro:  CN II through XII intact, motor grossly intact  EKG - none    Assess/Plan: 1. ICD system infection - he is s/p extraction and doing well.  2. ESRD on HD - as he has an indwelling HD catheter, he is not a candidate for another ICD. It is guaranteed to get infected.  3. DCM - his EF has normalized, making the indication for another ICD much less. I would suggest we recheck his echo.  4. H/o ICD shocks - unclear if this was for atrial fib. He will continue amiodarone for now.   Cristopher Peru, M.D.

## 2017-10-23 ENCOUNTER — Encounter: Payer: Self-pay | Admitting: Vascular Surgery

## 2017-10-24 ENCOUNTER — Other Ambulatory Visit: Payer: Self-pay | Admitting: Family Medicine

## 2017-10-27 ENCOUNTER — Ambulatory Visit: Payer: Medicare HMO | Admitting: Podiatry

## 2017-10-29 ENCOUNTER — Ambulatory Visit (INDEPENDENT_AMBULATORY_CARE_PROVIDER_SITE_OTHER): Payer: Medicare HMO | Admitting: Family Medicine

## 2017-10-29 ENCOUNTER — Telehealth: Payer: Self-pay | Admitting: Pharmacist

## 2017-10-29 ENCOUNTER — Encounter: Payer: Self-pay | Admitting: Vascular Surgery

## 2017-10-29 ENCOUNTER — Telehealth: Payer: Self-pay | Admitting: *Deleted

## 2017-10-29 ENCOUNTER — Encounter: Payer: Self-pay | Admitting: Family Medicine

## 2017-10-29 ENCOUNTER — Ambulatory Visit (INDEPENDENT_AMBULATORY_CARE_PROVIDER_SITE_OTHER): Payer: Self-pay | Admitting: Vascular Surgery

## 2017-10-29 ENCOUNTER — Other Ambulatory Visit: Payer: Self-pay | Admitting: *Deleted

## 2017-10-29 ENCOUNTER — Other Ambulatory Visit: Payer: Self-pay

## 2017-10-29 ENCOUNTER — Encounter: Payer: Self-pay | Admitting: *Deleted

## 2017-10-29 VITALS — BP 120/72 | HR 79 | Temp 97.6°F | Ht 73.0 in | Wt 274.8 lb

## 2017-10-29 VITALS — BP 110/75 | HR 81 | Temp 97.6°F | Resp 20 | Ht 73.0 in | Wt 265.0 lb

## 2017-10-29 DIAGNOSIS — D649 Anemia, unspecified: Secondary | ICD-10-CM

## 2017-10-29 DIAGNOSIS — R197 Diarrhea, unspecified: Secondary | ICD-10-CM

## 2017-10-29 DIAGNOSIS — R229 Localized swelling, mass and lump, unspecified: Secondary | ICD-10-CM

## 2017-10-29 DIAGNOSIS — N186 End stage renal disease: Secondary | ICD-10-CM

## 2017-10-29 DIAGNOSIS — I4891 Unspecified atrial fibrillation: Secondary | ICD-10-CM

## 2017-10-29 DIAGNOSIS — I87309 Chronic venous hypertension (idiopathic) without complications of unspecified lower extremity: Secondary | ICD-10-CM

## 2017-10-29 DIAGNOSIS — Z992 Dependence on renal dialysis: Secondary | ICD-10-CM

## 2017-10-29 DIAGNOSIS — IMO0002 Reserved for concepts with insufficient information to code with codable children: Secondary | ICD-10-CM | POA: Insufficient documentation

## 2017-10-29 LAB — POCT HEMOGLOBIN: Hemoglobin: 8.1 g/dL — AB (ref 14.1–18.1)

## 2017-10-29 NOTE — Patient Instructions (Addendum)
It was great seeing you today! We have addressed the following issues today  1. I will write a note about your coumadin and send it to your cardiologist. 2. You have an appointment scheduled with [facility] on 11/16/2017. 3. Please make sure you check your phosphorus and calcium level at dialysis and let them know about possible calciphylaxis. 4. We will check for C.Diff I will follow up on results  If we did any lab work today, and the results require attention, either me or my nurse will get in touch with you. If everything is normal, you will get a letter in mail and a message via . If you don't hear from [place] in two weeks, please give [place] a call. Otherwise, we look forward to seeing you again at your next visit. If you have any questions or concerns before then, please call the clinic at [phone].  Please bring all your medications to every doctors visit  Sign up for My Chart to have easy access to your labs results, and communication with your Primary care physician. Please ask Front Desk for some assistance.   Please check-out at the front desk before leaving the clinic.    Take Care,   Dr. Andy Gauss

## 2017-10-29 NOTE — Assessment & Plan Note (Signed)
Patient reports 2 small mass/nodule on his left flank and left breast that have appeared a few weeks ago.  Patient reports that he has pain when he lays on his side.  On exam patient has 2 palpable nodule well-demarcated hard and mobile.  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  Also on differential would be a cyst although unlikely given distribution and exam findings.  Patient has been on binders with dialysis for the past 5 years and reports normal phosphorus and calcium level. --Patient will recheck Foss level and calcium level at dialysis --Tylenol for pain as needed -- nephrology recommendation as needed

## 2017-10-29 NOTE — Telephone Encounter (Signed)
Pt takes Coumadin for afib with CHADSVASc score of 3 max (HTN listed although no elevated BP historically, PAD/CAD, and CHF). Ok to hold warfarin for 3 days prior to procedure as requested.

## 2017-10-29 NOTE — Telephone Encounter (Signed)
-----   Message from [name], RN sent at 10/29/2017 12:42 PM EDT ----- Regarding: FW: Coumadin hold for procedure    ----- Message ----- From: [name], RN Sent: 10/29/2017  12:39 PM To: Cv [facility] Anticoag Subject: Coumadin hold for procedure                    Anticoag clearance requested. Venogram with possible intervention scheduled for 11/05/17 with Dr. Bridgett Larsson. Instructed to hold Coumadin starting on 6/24. Thank you for your time. Becky RN

## 2017-10-29 NOTE — Assessment & Plan Note (Signed)
Patient continued to endorse loose stools multiple times a day (7-8 times).  Patient denies any frank blood or tarry stools.  Patient was tested for C. difficile about 2 months ago for similar symptoms and results were negative.  Given patient was on prolonged IV antibiotics for bacteremia it is possible that he has subsequently developed C. difficile after completion of treatment.  Diarrhea could also be secondary to changes in gut biome secondary to antibiotic use. --We will recheck C. Difficile --Referred to GI for colonoscopy and further evaluation of diarrhea if needed --Patient will need to have Coumadin held prior to procedure --Appointment made with medical GI for 11/16/2017

## 2017-10-29 NOTE — Assessment & Plan Note (Signed)
Patient seen by cardiology recently for evaluation for atrial fibrillation and reinsertion of ICD.  After further assessment cardiology decided that patient did not need reinsertion of ICD due to increased risk of infection.  In addition, it appears that patient has not had any atrial fibrillation episodes recorded since he moved here in [place] 5 years ago.  Patient has been maintained on warfarin as well as amiodarone for the time being.  He will follow-up with cardiology on 11/03/2017.  Given patient's history of GI bleed and need for colonoscopy, continuation of coumadin use will need to be discussed with Cardiology. Will recommend holding warfarin for at least 3 weeks until patient can get colonoscopy.  Patient will need cardiology to sign off.  [id number] 2.

## 2017-10-29 NOTE — Telephone Encounter (Signed)
-----   Message from [name], RN sent at 10/29/2017 12:39 PM EDT ----- Regarding: Coumadin hold for procedure  Anticoag clearance requested. Venogram with possible intervention scheduled for 11/05/17 with Dr. Bridgett Larsson. Instructed to hold Coumadin starting on 6/24. Thank you for your time. Becky RN

## 2017-10-29 NOTE — Assessment & Plan Note (Signed)
Patient point-of-care hemoglobin today is 8.1 close to baseline.  Last hemoglobin on 10/13/2016 was 9.9 which has been significantly higher than recent trend.  Patient denies any melena.  Had an EGD while admitted in the hospital back in May which showed evidence of GI bleed.  Patient is currently on protonix and sulcrafate.  Patient receiving IV iron at dialysis in addition to recombinant erythropoietin.  Patient is getting weekly CBCs at HD we will continue to monitor.

## 2017-10-29 NOTE — Progress Notes (Signed)
Subjective:    Patient ID: Adam Alm., male    DOB: Dec 26, 1960, 57 y.o.   MRN: [id number]   CC: Follow-up after recent hospitalization  HPI: Patient is a 57 year old male who past medical history significant for ESRD on HD, atrial fibrillation, pulmonary hypertension, hypothyroidism, hyperlipidemia who presents today to follow-up on recent hospital stay.  Patient reports that he has been doing better since discharge from hospital.  He was seen by vascular for cannulation of AV fistula.  Patient tolerated procedure well with no complications.  Patient denies any recent tarry stools but does endorse multiple bowel movement a day.  Patient also reports 2 painful lumps on his left flank and left breast.  Patient has been going to dialysis and is receiving iron supplementation.  He continues to take binders as prescribed.  Patient was also seen by cardiology who felt that he did not require an ICD.  He is currently on Coumadin and amiodarone for atrial fibrillation.  Patient denies any shortness of breath, chest pain, abdominal pain, nausea, vomiting, dizziness, headaches.   Smoking status reviewed   ROS: all other systems were reviewed and are negative other than in the HPI   Past Medical History:  Diagnosis Date  . AICD (automatic cardioverter/defibrillator) present    Medtronic   . Aortic stenosis    moderate AS by 08/2016 echo  . Arthritis    "knees" (08/05/2016)  . Atrial fibrillation ([place])   . ESRD (end stage renal disease) on dialysis ([place])    "M, T, W, T, F; NX stage hemodialysis; I do it at home" (08/05/2016)  . Great toe amputation status ([place])    status post left hallux amputation 08/01/14  . History of blood transfusion 2009   "S/P biopsy for prostate cancer check"  . Hypertension   . Hypothyroidism (acquired)   . Mitral stenosis    moderate mitral stenosis  . Nonischemic cardiomyopathy ([place])   . Pneumonia 2014; 08/05/2016  . PONV (postoperative nausea and  vomiting)   . Pulmonary HTN ([place])   . Renal insufficiency   . Thyroid cancer ([place]) 2011    Past Surgical History:  Procedure Laterality Date  . ARTERIAL LINE INSERTION Right 08/24/2017   Procedure: ARTERIAL LINE INSERTION INTO RIGHT FEMORAL ARTERY;  Surgeon: Conrad St. Jo, MD;  Location: [place];  Service: Vascular;  Laterality: Right;  . BASCILIC VEIN TRANSPOSITION Right 10/13/2017   Procedure: FIRST STAGE BRACHIAL VEIN TRANSPOSITION RIGHT ARM;  Surgeon: Conrad Rosedale, MD;  Location: [place];  Service: Vascular;  Laterality: Right;  . BIOPSY  09/25/2017   Procedure: BIOPSY;  Surgeon: Ronnette Juniper, MD;  Location: [facility] ENDOSCOPY;  Service: Gastroenterology;;  . CARDIAC CATHETERIZATION Right 2017  . CARDIAC DEFIBRILLATOR PLACEMENT  2009  . DIALYSIS FISTULA CREATION Left 2003   "upper arm"  . DIALYSIS/PERMA CATHETER REMOVAL Left 09/01/2017   Procedure: DIALYSIS/PERMA CATHETER REMOVAL;  Surgeon: Conrad Sullivan, MD;  Location: [place];  Service: Vascular;  Laterality: Left;  . ESOPHAGOGASTRODUODENOSCOPY (EGD) WITH PROPOFOL N/A 09/25/2017   Procedure: ESOPHAGOGASTRODUODENOSCOPY (EGD) WITH PROPOFOL;  Surgeon: Ronnette Juniper, MD;  Location: [place];  Service: Gastroenterology;  Laterality: N/A;  . HERNIA REPAIR    . ICD LEAD REMOVAL Right 08/20/2017   Procedure: ICD EXTRACTION WITH C-ARM;  Surgeon: Evans Lance, MD;  Location[facility] OR;  Service: Cardiovascular;  Laterality: Right;  DR VAN TRIGT TO BACK-UP  . icd removed    . INSERTION OF DIALYSIS CATHETER N/A 11/11/2016  Procedure: INSERTION OF TUNNELED DIALYSIS CATHETER;  Surgeon: Angelia Mould, MD;  Location: [place];  Service: Vascular;  Laterality: N/A;  . INSERTION OF DIALYSIS CATHETER Left 08/24/2017   Procedure: INSERTION OF DIALYSIS CATHETER;  Surgeon: Conrad Lamar, MD;  Location: [place];  Service: Vascular;  Laterality: Left;  . INSERTION OF DIALYSIS CATHETER Left 09/01/2017   Procedure: INSERTION OF DIALYSIS CATHETER;  Surgeon: Conrad Chaumont,  MD;  Location: [place];  Service: Vascular;  Laterality: Left;  . KIDNEY TRANSPLANT  2009  . KNEE SURGERY Bilateral 2000s   "drained fluid"  . LAPAROSCOPIC GASTRIC BANDING  2006  . PROSTATE BIOPSY  2009  . REVISON OF ARTERIOVENOUS FISTULA Left 11/21/4578   Procedure: PLICATION OF ARTERIOVENOUS FISTULA ANEURYSM;  Surgeon: Elam Dutch, MD;  Location: [place];  Service: Vascular;  Laterality: Left;  . REVISON OF ARTERIOVENOUS FISTULA Left 11/10/2016   Procedure: REVISON OF ARTERIOVENOUS FISTULA - LEFT UPPER ARM;  Surgeon: Elam Dutch, MD;  Location: [place];  Service: Vascular;  Laterality: Left;  . REVISON OF ARTERIOVENOUS FISTULA Left 08/24/2017   Procedure: REVISON OF ARTERIOVENOUS FISTULA LEFT;  Surgeon: Conrad Sterlington, MD;  Location: [place];  Service: Vascular;  Laterality: Left;  . TEE WITHOUT CARDIOVERSION N/A 08/20/2017   Procedure: TRANSESOPHAGEAL ECHOCARDIOGRAM (TEE);  Surgeon: Evans Lance, MD;  Location: [place];  Service: Cardiovascular;  Laterality: N/A;  . THROMBECTOMY W/ EMBOLECTOMY Left 12/25/2014   Procedure: THROMBECTOMY ARTERIOVENOUS FISTULA;  Surgeon: Elam Dutch, MD;  Location: [place];  Service: Vascular;  Laterality: Left;  . THYROIDECTOMY  2011  . TOE AMPUTATION Bilateral     right 1st and  2nd digits; left 1st and 3rd digits"  . UMBILICAL HERNIA REPAIR  X 2  . VEIN HARVEST Left 08/24/2017   Procedure: VEIN HARVEST;  Surgeon: Conrad Lower Burrell, MD;  Location: [facility] OR;  Service: Vascular;  Laterality: Left;    Past medical history, surgical, family, and social history reviewed and updated in the EMR as appropriate.  Objective:  BP 120/72   Pulse 79   Temp 97.6 F (36.4 C) (Oral)   Ht 6\' 1"  (1.854 m)   Wt 274 lb 12.8 oz (124.6 kg)   SpO2 99%   BMI 36.26 kg/m   Vitals and nursing note reviewed  General: NAD, pleasant, able to participate in exam Cardiac: RRR, normal heart sounds, no murmurs. 2+ radial and PT pulses bilaterally Respiratory: CTAB, normal effort, No  wheezes, rales or rhonchi Abdomen: soft, nontender, nondistended, no hepatic or splenomegaly, +BS Extremities: no edema or cyanosis. WWP. Skin: warm and dry, no rashes noted Neuro: alert and oriented x4, no focal deficits Psych: Normal affect and mood   Assessment & Plan:    Atrial fibrillation [I48.91] Patient seen by cardiology recently for evaluation for atrial fibrillation and reinsertion of ICD.  After further assessment cardiology decided that patient did not need reinsertion of ICD due to increased risk of infection.  In addition, it appears that patient has not had any atrial fibrillation episodes recorded since he moved here in [place] 5 years ago.  Patient has been maintained on warfarin as well as amiodarone for the time being.  He will follow-up with cardiology on 11/03/2017.  Given patient's history of GI bleed and need for colonoscopy, continuation of coumadin use will need to be discussed with Cardiology. Will recommend holding warfarin for at least 3 weeks until patient can get colonoscopy.  Patient will need cardiology  to sign off.  CHA2DS2-Vasc 2.  Anemia Patient point-of-care hemoglobin today is 8.1 close to baseline.  Last hemoglobin on 10/13/2016 was 9.9 which has been significantly higher than recent trend.  Patient denies any melena.  Had an EGD while admitted in the hospital back in May which showed evidence of GI bleed.  Patient is currently on protonix and sulcrafate.  Patient receiving IV iron at dialysis in addition to recombinant erythropoietin.  Patient is getting weekly CBCs at HD we will continue to monitor.  Diarrhea Patient continued to endorse loose stools multiple times a day (7-8 times).  Patient denies any frank blood or tarry stools.  Patient was tested for C. difficile about 2 months ago for similar symptoms and results were negative.  Given patient was on prolonged IV antibiotics for bacteremia it is possible that he has subsequently developed C.  difficile after completion of treatment.  Diarrhea could also be secondary to changes in gut biome secondary to antibiotic use. --We will recheck C. Difficile --Referred to GI for colonoscopy and further evaluation of diarrhea if needed --Patient will need to have Coumadin held prior to procedure --Appointment made with medical GI for 11/16/2017  Mass Patient reports 2 small mass/nodule on his left flank and left breast that have appeared a few weeks ago.  Patient reports that he has pain when he lays on his side.  On exam patient has 2 palpable nodule well-demarcated hard and mobile.  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  Also on differential would be a cyst although unlikely given distribution and exam findings.  Patient has been on binders with dialysis for the past 5 years and reports normal phosphorus and calcium level. --Patient will recheck Foss level and calcium level at dialysis --Tylenol for pain as needed -- nephrology recommendation as needed    [name], MD [place] PGY-2

## 2017-10-29 NOTE — Progress Notes (Signed)
Patient is a 57 year old male sent for evaluation of right upper extremity swelling after placement of a right brachial vein fistula.  The fistula was placed by Dr. Bridgett Larsson October 13, 2017.  The patient has had a previous AICD on the right side that was removed for infection.  He has also had numerous catheters on the right side.  He states that the arm began swelling a few days after the procedure and it has persisted and not improved.  He denies any numbness or tingling in his hand.  He is on warfarin for atrial fibrillation.  Physical exam:  Vitals:   10/29/17 1148  BP: 110/75  Pulse: 81  Resp: 20  Temp: 97.6 F (36.4 C)  TempSrc: Oral  SpO2: 98%  Weight: 265 lb (120.2 kg)  Height: 6\' 1"  (1.854 m)    Extremities: Right upper arm well-healed antecubital incision audible bruit in fistula right hand well perfused no palpable radial pulse para graph chest: Clear to auscultation bilaterally left side catheter in place faint collaterals along the right chest wall dilated external jugular vein branch base of right neck large scar from AICD removal right anterior chest.  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  Plan: The patient will be scheduled for a right central venogram possible intervention by Dr. Bridgett Larsson on November 05, 2017.  Risk benefits possible complications of procedure details were discussed with the patient today including but not limited to bleeding infection contrast reaction.  He states he has had some nausea with contrast in the past.  I discussed with him that he should let the nurses know the day of his procedure and he can get something for nausea.  [name], MD [facility] Office: [phone] Pager: [phone]

## 2017-10-29 NOTE — Progress Notes (Signed)
B

## 2017-10-30 ENCOUNTER — Encounter: Payer: Self-pay | Admitting: Family Medicine

## 2017-10-30 DIAGNOSIS — R197 Diarrhea, unspecified: Secondary | ICD-10-CM | POA: Diagnosis not present

## 2017-10-30 NOTE — Addendum Note (Signed)
Addended by: Francene Castle on: 10/30/2017 12:02 PM   Modules accepted: Orders

## 2017-11-01 LAB — CLOSTRIDIUM DIFFICILE BY PCR: Toxigenic C. Difficile by PCR: NEGATIVE

## 2017-11-02 ENCOUNTER — Other Ambulatory Visit (HOSPITAL_COMMUNITY): Payer: Self-pay | Admitting: Nephrology

## 2017-11-02 ENCOUNTER — Encounter (HOSPITAL_COMMUNITY): Payer: Self-pay | Admitting: Emergency Medicine

## 2017-11-02 ENCOUNTER — Inpatient Hospital Stay (HOSPITAL_COMMUNITY)
Admission: EM | Admit: 2017-11-02 | Discharge: 2017-11-12 | DRG: 488 | Disposition: A | Discharge status: Home Health | Payer: Medicare HMO | Attending: Family Medicine | Admitting: Family Medicine

## 2017-11-02 ENCOUNTER — Emergency Department (HOSPITAL_COMMUNITY): Payer: Medicare HMO

## 2017-11-02 DIAGNOSIS — I132 Hypertensive heart and chronic kidney disease with heart failure and with stage 5 chronic kidney disease, or end stage renal disease: Secondary | ICD-10-CM | POA: Diagnosis not present

## 2017-11-02 DIAGNOSIS — I517 Cardiomegaly: Secondary | ICD-10-CM | POA: Diagnosis not present

## 2017-11-02 DIAGNOSIS — M79609 Pain in unspecified limb: Secondary | ICD-10-CM

## 2017-11-02 DIAGNOSIS — N2581 Secondary hyperparathyroidism of renal origin: Secondary | ICD-10-CM | POA: Diagnosis present

## 2017-11-02 DIAGNOSIS — M7989 Other specified soft tissue disorders: Principal | ICD-10-CM

## 2017-11-02 DIAGNOSIS — M009 Pyogenic arthritis, unspecified: Principal | ICD-10-CM | POA: Diagnosis present

## 2017-11-02 DIAGNOSIS — M170 Bilateral primary osteoarthritis of knee: Secondary | ICD-10-CM | POA: Diagnosis not present

## 2017-11-02 DIAGNOSIS — M25561 Pain in right knee: Secondary | ICD-10-CM

## 2017-11-02 DIAGNOSIS — I82B12 Acute embolism and thrombosis of left subclavian vein: Secondary | ICD-10-CM | POA: Diagnosis not present

## 2017-11-02 DIAGNOSIS — I428 Other cardiomyopathies: Secondary | ICD-10-CM | POA: Diagnosis present

## 2017-11-02 DIAGNOSIS — M06861 Other specified rheumatoid arthritis, right knee: Secondary | ICD-10-CM | POA: Diagnosis not present

## 2017-11-02 DIAGNOSIS — N185 Chronic kidney disease, stage 5: Secondary | ICD-10-CM | POA: Diagnosis not present

## 2017-11-02 DIAGNOSIS — M25461 Effusion, right knee: Secondary | ICD-10-CM | POA: Diagnosis not present

## 2017-11-02 DIAGNOSIS — E890 Postprocedural hypothyroidism: Secondary | ICD-10-CM | POA: Diagnosis present

## 2017-11-02 DIAGNOSIS — Z89421 Acquired absence of other right toe(s): Secondary | ICD-10-CM

## 2017-11-02 DIAGNOSIS — T827XXS Infection and inflammatory reaction due to other cardiac and vascular devices, implants and grafts, sequela: Secondary | ICD-10-CM

## 2017-11-02 DIAGNOSIS — Z7901 Long term (current) use of anticoagulants: Secondary | ICD-10-CM | POA: Diagnosis not present

## 2017-11-02 DIAGNOSIS — N186 End stage renal disease: Secondary | ICD-10-CM | POA: Diagnosis not present

## 2017-11-02 DIAGNOSIS — R229 Localized swelling, mass and lump, unspecified: Secondary | ICD-10-CM | POA: Diagnosis not present

## 2017-11-02 DIAGNOSIS — Z888 Allergy status to other drugs, medicaments and biological substances status: Secondary | ICD-10-CM

## 2017-11-02 DIAGNOSIS — Z01811 Encounter for preprocedural respiratory examination: Secondary | ICD-10-CM

## 2017-11-02 DIAGNOSIS — T827XXA Infection and inflammatory reaction due to other cardiac and vascular devices, implants and grafts, initial encounter: Secondary | ICD-10-CM | POA: Diagnosis present

## 2017-11-02 DIAGNOSIS — I080 Rheumatic disorders of both mitral and aortic valves: Secondary | ICD-10-CM | POA: Diagnosis present

## 2017-11-02 DIAGNOSIS — E8889 Other specified metabolic disorders: Secondary | ICD-10-CM | POA: Diagnosis present

## 2017-11-02 DIAGNOSIS — Z992 Dependence on renal dialysis: Secondary | ICD-10-CM

## 2017-11-02 DIAGNOSIS — I120 Hypertensive chronic kidney disease with stage 5 chronic kidney disease or end stage renal disease: Secondary | ICD-10-CM | POA: Diagnosis not present

## 2017-11-02 DIAGNOSIS — E669 Obesity, unspecified: Secondary | ICD-10-CM | POA: Diagnosis present

## 2017-11-02 DIAGNOSIS — I272 Pulmonary hypertension, unspecified: Secondary | ICD-10-CM | POA: Diagnosis not present

## 2017-11-02 DIAGNOSIS — E785 Hyperlipidemia, unspecified: Secondary | ICD-10-CM | POA: Diagnosis present

## 2017-11-02 DIAGNOSIS — I9589 Other hypotension: Secondary | ICD-10-CM | POA: Diagnosis present

## 2017-11-02 DIAGNOSIS — K219 Gastro-esophageal reflux disease without esophagitis: Secondary | ICD-10-CM | POA: Diagnosis present

## 2017-11-02 DIAGNOSIS — E039 Hypothyroidism, unspecified: Secondary | ICD-10-CM | POA: Diagnosis not present

## 2017-11-02 DIAGNOSIS — Z8249 Family history of ischemic heart disease and other diseases of the circulatory system: Secondary | ICD-10-CM

## 2017-11-02 DIAGNOSIS — Z85850 Personal history of malignant neoplasm of thyroid: Secondary | ICD-10-CM

## 2017-11-02 DIAGNOSIS — Z79899 Other long term (current) drug therapy: Secondary | ICD-10-CM

## 2017-11-02 DIAGNOSIS — I4891 Unspecified atrial fibrillation: Secondary | ICD-10-CM | POA: Diagnosis present

## 2017-11-02 DIAGNOSIS — M65161 Other infective (teno)synovitis, right knee: Secondary | ICD-10-CM | POA: Diagnosis not present

## 2017-11-02 DIAGNOSIS — I959 Hypotension, unspecified: Secondary | ICD-10-CM | POA: Diagnosis not present

## 2017-11-02 DIAGNOSIS — I5032 Chronic diastolic (congestive) heart failure: Secondary | ICD-10-CM | POA: Diagnosis not present

## 2017-11-02 DIAGNOSIS — T82590A Other mechanical complication of surgically created arteriovenous fistula, initial encounter: Secondary | ICD-10-CM | POA: Diagnosis not present

## 2017-11-02 DIAGNOSIS — Z89412 Acquired absence of left great toe: Secondary | ICD-10-CM

## 2017-11-02 DIAGNOSIS — Y832 Surgical operation with anastomosis, bypass or graft as the cause of abnormal reaction of the patient, or of later complication, without mention of misadventure at the time of the procedure: Secondary | ICD-10-CM | POA: Diagnosis not present

## 2017-11-02 DIAGNOSIS — L0291 Cutaneous abscess, unspecified: Secondary | ICD-10-CM | POA: Diagnosis not present

## 2017-11-02 DIAGNOSIS — E440 Moderate protein-calorie malnutrition: Secondary | ICD-10-CM | POA: Diagnosis not present

## 2017-11-02 DIAGNOSIS — I739 Peripheral vascular disease, unspecified: Secondary | ICD-10-CM | POA: Diagnosis present

## 2017-11-02 DIAGNOSIS — Z419 Encounter for procedure for purposes other than remedying health state, unspecified: Secondary | ICD-10-CM

## 2017-11-02 DIAGNOSIS — E1129 Type 2 diabetes mellitus with other diabetic kidney complication: Secondary | ICD-10-CM | POA: Diagnosis not present

## 2017-11-02 DIAGNOSIS — T82898A Other specified complication of vascular prosthetic devices, implants and grafts, initial encounter: Secondary | ICD-10-CM | POA: Diagnosis not present

## 2017-11-02 DIAGNOSIS — D631 Anemia in chronic kidney disease: Secondary | ICD-10-CM | POA: Diagnosis present

## 2017-11-02 DIAGNOSIS — D72829 Elevated white blood cell count, unspecified: Secondary | ICD-10-CM | POA: Diagnosis not present

## 2017-11-02 DIAGNOSIS — R222 Localized swelling, mass and lump, trunk: Secondary | ICD-10-CM | POA: Diagnosis not present

## 2017-11-02 DIAGNOSIS — Z6836 Body mass index (BMI) 36.0-36.9, adult: Secondary | ICD-10-CM

## 2017-11-02 DIAGNOSIS — M79604 Pain in right leg: Secondary | ICD-10-CM

## 2017-11-02 DIAGNOSIS — Z79890 Hormone replacement therapy (postmenopausal): Secondary | ICD-10-CM

## 2017-11-02 DIAGNOSIS — L02415 Cutaneous abscess of right lower limb: Secondary | ICD-10-CM | POA: Diagnosis not present

## 2017-11-02 DIAGNOSIS — Z8619 Personal history of other infectious and parasitic diseases: Secondary | ICD-10-CM

## 2017-11-02 DIAGNOSIS — Z91041 Radiographic dye allergy status: Secondary | ICD-10-CM

## 2017-11-02 LAB — PROTIME-INR
INR: 2.05
PROTHROMBIN TIME: 22.9 s — AB (ref 11.4–15.2)

## 2017-11-02 LAB — SYNOVIAL CELL COUNT + DIFF, W/ CRYSTALS
Crystals, Fluid: NONE SEEN
Eosinophils-Synovial: 0 % (ref 0–1)
LYMPHOCYTES-SYNOVIAL FLD: 0 % (ref 0–20)
MONOCYTE-MACROPHAGE-SYNOVIAL FLUID: 10 % — AB (ref 50–90)
Neutrophil, Synovial: 90 % — ABNORMAL HIGH (ref 0–25)
WBC, SYNOVIAL: 45500 /mm3 — AB (ref 0–200)

## 2017-11-02 MED ORDER — TADALAFIL 20 MG PO TABS
20.0000 mg | ORAL_TABLET | Freq: Every day | ORAL | Status: DC
  Administered 2017-11-03 – 2017-11-12 (×9): 20 mg via ORAL
  Filled 2017-11-02 (×12): qty 1

## 2017-11-02 MED ORDER — ACETAMINOPHEN 325 MG PO TABS
650.0000 mg | ORAL_TABLET | Freq: Four times a day (QID) | ORAL | Status: DC | PRN
  Administered 2017-11-05 – 2017-11-06 (×2): 650 mg via ORAL
  Filled 2017-11-02 (×3): qty 2

## 2017-11-02 MED ORDER — AMIODARONE HCL 200 MG PO TABS
200.0000 mg | ORAL_TABLET | Freq: Every evening | ORAL | Status: DC
  Administered 2017-11-03 – 2017-11-11 (×8): 200 mg via ORAL
  Filled 2017-11-02 (×8): qty 1

## 2017-11-02 MED ORDER — LIDOCAINE HCL 2 % IJ SOLN
INTRAMUSCULAR | Status: AC
  Filled 2017-11-02: qty 20

## 2017-11-02 MED ORDER — AMBRISENTAN 5 MG PO TABS
10.0000 mg | ORAL_TABLET | Freq: Every day | ORAL | Status: DC
  Administered 2017-11-03 – 2017-11-12 (×9): 10 mg via ORAL
  Filled 2017-11-02 (×12): qty 2

## 2017-11-02 MED ORDER — RENA-VITE PO TABS
1.0000 | ORAL_TABLET | Freq: Every day | ORAL | Status: DC
  Administered 2017-11-03 – 2017-11-11 (×9): 1 via ORAL
  Filled 2017-11-02 (×9): qty 1

## 2017-11-02 MED ORDER — SUCRALFATE 1 G PO TABS
1.0000 g | ORAL_TABLET | Freq: Two times a day (BID) | ORAL | Status: DC
  Administered 2017-11-03 – 2017-11-12 (×16): 1 g via ORAL
  Filled 2017-11-02 (×16): qty 1

## 2017-11-02 MED ORDER — ACETAMINOPHEN 650 MG RE SUPP: 650.0000 mg | Freq: Four times a day (QID) | RECTAL | Status: DC | PRN

## 2017-11-02 MED ORDER — KETOROLAC TROMETHAMINE 30 MG/ML IJ SOLN: 15.0000 mg | Freq: Once | INTRAMUSCULAR | Status: DC

## 2017-11-02 MED ORDER — VANCOMYCIN HCL IN DEXTROSE 1-5 GM/200ML-% IV SOLN
1000.0000 mg | Freq: Once | INTRAVENOUS | Status: AC
  Administered 2017-11-03: 1000 mg via INTRAVENOUS
  Filled 2017-11-02: qty 200

## 2017-11-02 MED ORDER — PANTOPRAZOLE SODIUM 40 MG PO TBEC
40.0000 mg | DELAYED_RELEASE_TABLET | Freq: Two times a day (BID) | ORAL | Status: DC
  Administered 2017-11-03 – 2017-11-11 (×13): 40 mg via ORAL
  Filled 2017-11-02 (×13): qty 1

## 2017-11-02 MED ORDER — ONDANSETRON HCL 4 MG PO TABS: 4.0000 mg | ORAL_TABLET | Freq: Four times a day (QID) | ORAL | Status: DC | PRN

## 2017-11-02 MED ORDER — LEVOTHYROXINE SODIUM 100 MCG PO TABS
300.0000 ug | ORAL_TABLET | Freq: Every day | ORAL | Status: DC
  Administered 2017-11-03 – 2017-11-12 (×10): 300 ug via ORAL
  Filled 2017-11-02 (×2): qty 3
  Filled 2017-11-02: qty 4
  Filled 2017-11-02: qty 3
  Filled 2017-11-02: qty 2
  Filled 2017-11-02 (×5): qty 3
  Filled 2017-11-02: qty 2
  Filled 2017-11-02: qty 3

## 2017-11-02 MED ORDER — ONDANSETRON HCL 4 MG/2ML IJ SOLN
4.0000 mg | Freq: Four times a day (QID) | INTRAMUSCULAR | Status: DC | PRN
  Administered 2017-11-06: 4 mg via INTRAVENOUS

## 2017-11-02 MED ORDER — CINACALCET HCL 30 MG PO TABS
90.0000 mg | ORAL_TABLET | Freq: Every day | ORAL | Status: DC
  Administered 2017-11-03 – 2017-11-11 (×8): 90 mg via ORAL
  Filled 2017-11-02 (×8): qty 3

## 2017-11-02 MED ORDER — MIDODRINE HCL 5 MG PO TABS
10.0000 mg | ORAL_TABLET | Freq: Three times a day (TID) | ORAL | Status: DC
  Administered 2017-11-03 – 2017-11-08 (×16): 10 mg via ORAL
  Administered 2017-11-09: 5 mg via ORAL
  Administered 2017-11-09 – 2017-11-12 (×8): 10 mg via ORAL
  Filled 2017-11-02 (×25): qty 2

## 2017-11-02 MED ORDER — ATORVASTATIN CALCIUM 40 MG PO TABS
40.0000 mg | ORAL_TABLET | Freq: Every evening | ORAL | Status: DC
  Administered 2017-11-03 – 2017-11-11 (×8): 40 mg via ORAL
  Filled 2017-11-02 (×8): qty 1

## 2017-11-02 NOTE — ED Notes (Signed)
Lab called to confirm that no organisms were seen on gram stain results.

## 2017-11-02 NOTE — Progress Notes (Deleted)
HPI The patient presents for evaluation of pulmonary HTN.  He has end-stage renal disease secondary to uncontrolled hypertension and apparent glomerulonephritis.   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 He is on dialysis.  He was seen at [facility] for part of his transplant evaluation.  Echocardiogram demonstrated well-preserved ejection fraction with some moderate tricuspid regurgitation. He had a right heart catheterization and his pulmonary pressures were actually quite good with his pulmonary vascular resistance being 2.2 [place] units.    Perfusion study was negative for evidence of chronic embolism.  Since I last saw him he had to have his ICD explanted secondary to infection.  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  He saw Dr. Lovena Le and at this time, in part because his EF had improved, there was no plan to replace the ICD.   He was in the ED last night.  ***.     Since I last saw him he has done well.  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  He has home dialysis and feels much better since he started this.  The patient denies any new symptoms such as chest discomfort, neck or arm discomfort. There has been no new shortness of breath, PND or orthopnea. There have been no reported palpitations, presyncope or syncope.   Allergies  Allergen Reactions  . Enalaprilat Swelling and Other (See Comments)    Mouth swelling  . Vasotec [Enalapril] Swelling and Other (See Comments)    MOUTH SWELLING  . Iodinated Diagnostic Agents Nausea And Vomiting and Nausea Only    Current Outpatient Medications  Medication Sig Dispense Refill  . ambrisentan (LETAIRIS) 10 MG tablet Take 1 tablet (10 mg total) by mouth daily. 30 tablet 11  . amiodarone (PACERONE) 200 MG tablet Take 200 mg by mouth every evening.     Marland Kitchen atorvastatin (LIPITOR) 40 MG tablet Take 1 tablet (40 mg total) by mouth every evening. 30 tablet 2  . calcium acetate (PHOSLO) 667 MG capsule Take 2 capsules (1,334  mg total) by mouth 3 (three) times daily with meals. (Patient not taking: Reported on 11/02/2017) 30 capsule 0  . cinacalcet (SENSIPAR) 30 MG tablet Take 3 tablets (90 mg total) by mouth every evening. 30 tablet 0  . levothyroxine (SYNTHROID, LEVOTHROID) 300 MCG tablet Take 1 tablet (300 mcg total) by mouth daily before breakfast. 90 tablet 3  . midodrine (PROAMATINE) 10 MG tablet TAKE 1 TABLET (10 MG TOTAL) BY MOUTH 3 (THREE) TIMES DAILY. 90 tablet 5  . multivitamin (RENA-VIT) TABS tablet TAKE 1 TABLET BY MOUTH EVERYDAY AT BEDTIME 30 tablet 0  . pantoprazole (PROTONIX) 40 MG tablet Take 1 tablet (40 mg total) by mouth 2 (two) times daily before a meal. 60 tablet 0  . sucralfate (CARAFATE) 1 g tablet TAKE 1 TABLET (1 G TOTAL) BY MOUTH 2 (TWO) TIMES DAILY. 60 tablet 0  . tadalafil (ADCIRCA/CIALIS) 20 MG tablet Take 20 mg by mouth daily.     Marland Kitchen warfarin (COUMADIN) 6 MG tablet TAKE 1 TO 1.5 TABLETS BY MOUTH DAILY AS DIRECTED BY COUMADIN CLINIC (Patient taking differently: Take 6 mg by mouth daily on Sunday. Take 9 mg by mouth daily on all other days.) 45 tablet 2   No current facility-administered medications for this visit.     Past Medical History:  Diagnosis Date  . AICD (automatic cardioverter/defibrillator) present    Medtronic   . Aortic stenosis    moderate AS  by 08/2016 echo  . Arthritis    "knees" (08/05/2016)  . Atrial fibrillation ([place])   . ESRD (end stage renal disease) on dialysis ([place])    "M, T, W, T, F; NX stage hemodialysis; I do it at home" (08/05/2016)  . Great toe amputation status ([place])    status post left hallux amputation 08/01/14  . History of blood transfusion 2009   "S/P biopsy for prostate cancer check"  . Hypertension   . Hypothyroidism (acquired)   . Mitral stenosis    moderate mitral stenosis  . Nonischemic cardiomyopathy ([place])   . Pneumonia 2014; 08/05/2016  . PONV (postoperative nausea and vomiting)   . Pulmonary HTN ([place])   . Renal insufficiency   . Thyroid  cancer ([place]) 2011    Past Surgical History:  Procedure Laterality Date  . ARTERIAL LINE INSERTION Right 08/24/2017   Procedure: ARTERIAL LINE INSERTION INTO RIGHT FEMORAL ARTERY;  Surgeon: Conrad Hypoluxo, MD;  Location: [place];  Service: Vascular;  Laterality: Right;  . BASCILIC VEIN TRANSPOSITION Right 10/13/2017   Procedure: FIRST STAGE BRACHIAL VEIN TRANSPOSITION RIGHT ARM;  Surgeon: Conrad [employer], MD;  Location: [place];  Service: Vascular;  Laterality: Right;  . BIOPSY  09/25/2017   Procedure: BIOPSY;  Surgeon: Ronnette Juniper, MD;  Location: [facility] ENDOSCOPY;  Service: Gastroenterology;;  . CARDIAC CATHETERIZATION Right 2017  . CARDIAC DEFIBRILLATOR PLACEMENT  2009  . DIALYSIS FISTULA CREATION Left 2003   "upper arm"  . DIALYSIS/PERMA CATHETER REMOVAL Left 09/01/2017   Procedure: DIALYSIS/PERMA CATHETER REMOVAL;  Surgeon: Conrad [place], MD;  Location: [place];  Service: Vascular;  Laterality: Left;  . ESOPHAGOGASTRODUODENOSCOPY (EGD) WITH PROPOFOL N/A 09/25/2017   Procedure: ESOPHAGOGASTRODUODENOSCOPY (EGD) WITH PROPOFOL;  Surgeon: Ronnette Juniper, MD;  Location: [place];  Service: Gastroenterology;  Laterality: N/A;  . HERNIA REPAIR    . ICD LEAD REMOVAL Right 08/20/2017   Procedure: ICD EXTRACTION WITH C-ARM;  Surgeon: Evans Lance, MD;  Location: [facility] OR;  Service: Cardiovascular;  Laterality: Right;  DR VAN TRIGT TO BACK-UP  . icd removed    . INSERTION OF DIALYSIS CATHETER N/A 11/11/2016   Procedure: INSERTION OF TUNNELED DIALYSIS CATHETER;  Surgeon: Angelia Mould, MD;  Location: [place];  Service: Vascular;  Laterality: N/A;  . INSERTION OF DIALYSIS CATHETER Left 08/24/2017   Procedure: INSERTION OF DIALYSIS CATHETER;  Surgeon: Conrad [place], MD;  Location: [place];  Service: Vascular;  Laterality: Left;  . INSERTION OF DIALYSIS CATHETER Left 09/01/2017   Procedure: INSERTION OF DIALYSIS CATHETER;  Surgeon: Conrad [place], MD;  Location: [place];  Service: Vascular;  Laterality: Left;  . KIDNEY  TRANSPLANT  2009  . KNEE SURGERY Bilateral 2000s   "drained fluid"  . LAPAROSCOPIC GASTRIC BANDING  2006  . PROSTATE BIOPSY  2009  . REVISON OF ARTERIOVENOUS FISTULA Left 09/14/3974   Procedure: PLICATION OF ARTERIOVENOUS FISTULA ANEURYSM;  Surgeon: Elam Dutch, MD;  Location: [place];  Service: Vascular;  Laterality: Left;  . REVISON OF ARTERIOVENOUS FISTULA Left 11/10/2016   Procedure: REVISON OF ARTERIOVENOUS FISTULA - LEFT UPPER ARM;  Surgeon: Elam Dutch, MD;  Location: [place];  Service: Vascular;  Laterality: Left;  . REVISON OF ARTERIOVENOUS FISTULA Left 08/24/2017   Procedure: REVISON OF ARTERIOVENOUS FISTULA LEFT;  Surgeon: Conrad Ohlman, MD;  Location: [place];  Service: Vascular;  Laterality: Left;  . TEE WITHOUT CARDIOVERSION N/A 08/20/2017   Procedure: TRANSESOPHAGEAL ECHOCARDIOGRAM (TEE);  Surgeon: Evans Lance,  MD;  Location: [place];  Service: Cardiovascular;  Laterality: N/A;  . THROMBECTOMY W/ EMBOLECTOMY Left 12/25/2014   Procedure: THROMBECTOMY ARTERIOVENOUS FISTULA;  Surgeon: Elam Dutch, MD;  Location: [place];  Service: Vascular;  Laterality: Left;  . THYROIDECTOMY  2011  . TOE AMPUTATION Bilateral     right 1st and  2nd digits; left 1st and 3rd digits"  . UMBILICAL HERNIA REPAIR  X 2  . VEIN HARVEST Left 08/24/2017   Procedure: VEIN HARVEST;  Surgeon: Conrad [place], MD;  Location: [facility] OR;  Service: Vascular;  Laterality: Left;    ROS:  ***  PHYSICAL EXAM There were no vitals taken for this visit.  GENERAL:  Well appearing NECK:  No jugular venous distention, waveform within normal limits, carotid upstroke brisk and symmetric, no bruits, no thyromegaly LUNGS:  Clear to auscultation bilaterally CHEST:   Healed ICD scar.   HEART:  PMI not displaced or sustained,S1 and S2 within normal limits, no S3, no S4, no clicks, no rubs, *** murmurs ABD:  Flat, positive bowel sounds normal in frequency in pitch, no bruits, no rebound, no guarding, no midline pulsatile mass,  no hepatomegaly, no splenomegaly EXT:  2 plus pulses throughout, no edema, no cyanosis no clubbing   **** GENERAL:  Well appearing NECK:  No jugular venous distention, waveform within normal limits, carotid upstroke brisk and symmetric, no bruits, no thyromegaly LUNGS:  Clear to auscultation bilaterally CHEST:  Well healed ICD pocket HEART:  PMI not displaced or sustained,S1 and S2 within normal limits, no S3, no S4, no clicks, no rubs, 2/6 holosystolic murmur at the apex, no diastolic murmurs ABD:  Flat, positive bowel sounds normal in frequency in pitch, no bruits, no rebound, no guarding, no midline pulsatile mass, no hepatomegaly, no splenomegaly EXT:  2 plus pulses throughout, no edema, no cyanosis no clubbing, dialysis access left upper arm.   EKG:  ***  Lab Results  Component Value Date   TSH 3.730 01/26/2017   ALT <5 (L) 09/23/2017   AST 18 09/23/2017   ALKPHOS 107 09/23/2017   BILITOT 0.5 09/23/2017   PROT 7.0 09/23/2017   ALBUMIN 3.2 (L) 09/24/2017     ASSESSMENT AND PLAN  CARDIOMYOPATHY:   The EF is preserved on echo in April.  ***   No change in meds is planned. .   No change in therapy is planned.   He will continue on the meds as listed.     ATRIAL FIB:   ***   Mr. Ashtan Girtman. has a CHA2DS2 - VASc score of 2.  He has been on amiodarone and warfarin.  This is currently on hold for a venography.  *** I will repeat liver profile and TSH today.   AS:  This was moderate on echo.  ***  and I will follow this clinically.  I will repeat an echo in April.   ICD:  There is no plan to replace this at this time.  ***  He is up to date with follow up. For this appt I reviewed the ICD transmission from last week.  There were no events.  He had this checked today.  His Optivol is chronically elevated but doesn't seem to correlate with his fluid status.  PULMONARY HTN:  He had normal pressures on cath at [facility] previously although his estimated pressure on echo is elevated.    It is lower on echo than it had been previously. ***   He remains on  meds as listed.  He is not having overt symptoms and I will make sure that he has follow up with Dr. Lake Bells.  HYPOTENSION:   He has chronic low BPs.  ***  but no syncope.  He had some concern that his Midodrine was interferring with his pulmonary HTN but that is likely not the case and we discussed this.   MS:  He has mild to moderate stenosis.  ***   I will follow this clinically and with repeat echoes.

## 2017-11-02 NOTE — Progress Notes (Signed)
VASCULAR LAB PRELIMINARY  PRELIMINARY  PRELIMINARY  PRELIMINARY  Right lower extremity venous duplex completed.    Preliminary report:  There is no DVT noted in the visualized veins of the right lower extremity.  Pulsatile waveforms suggestive of fluid overload. There is a large area of mixed echoes noted posterior and medial knee, etiology unknown.  Gave report to Dr. Loney Laurence, [facility], RVT 11/02/2017, 8:02 PM

## 2017-11-02 NOTE — H&P (Signed)
History and Physical    Adam [name]. JJK:[id number] DOB: 1961/01/27 DOA: 11/02/2017  PCP: [name], MD  Patient coming from: Home  I have personally briefly reviewed patient's old medical records in [place]  Chief Complaint: Leg pain  HPI: Adam [name]. is a 57 y.o. male with medical history significant of ESRD on dialysis MWF, last dialysis was done earlier today; A.Fib on coumadin.  Moderate AS, PAH, NICM s/p defibrillator removal in April this year following MSSA bacteremia, MSSA fistula infection.  In April he had MSSA bacteremia, 2/2 + blood culture on 4/8 positive for MSSA.  2/2 were negative on 4/9.  LUE Fistula developed ulcer which was also positive for MSSA (fistula ligated) on 4/15.  His AICD was also removed for suspicion of MSSA infection, dont see cultures from this though.  He underwent TEE on 4/11.  I cant pull up the result though.  Looks like Dr. Johnnye Sima couldn't see result either as of his 4/18 note.  Regardless, he was discharged and underwent ancef treatment until 5/20.  Today he presents to the ED with c/o R knee pain, swelling.  Has had recurrent swelling to R knee for past couple of weeks it seems.  Much worse today.  Sharp pain with standing.  Pain with knee movement.   ED Course: Knee tap shows 45k WBC, 90% PMNs, no crystals, no organisms on GS.  Culture pending.  Dr. Alvan Dame with ortho consulted.  He will follow along.   Review of Systems: As per HPI otherwise 10 point review of systems negative.   Past Medical History:  Diagnosis Date  . AICD (automatic cardioverter/defibrillator) present    [employer]   . Aortic stenosis    moderate AS by 08/2016 echo  . Arthritis    "knees" (08/05/2016)  . Atrial fibrillation ([place])   . ESRD (end stage renal disease) on dialysis ([place])    "M, T, W, T, F; NX stage hemodialysis; I do it at home" (08/05/2016)  . Great toe amputation status ([place])    status post left hallux amputation 08/01/14    . History of blood transfusion 2009   "S/P biopsy for prostate cancer check"  . Hypertension   . Hypothyroidism (acquired)   . Mitral stenosis    moderate mitral stenosis  . Nonischemic cardiomyopathy ([place])   . Pneumonia 2014; 08/05/2016  . PONV (postoperative nausea and vomiting)   . Pulmonary HTN ([place])   . Renal insufficiency   . Thyroid cancer ([place]) 2011    Past Surgical History:  Procedure Laterality Date  . ARTERIAL LINE INSERTION Right 08/24/2017   Procedure: ARTERIAL LINE INSERTION INTO RIGHT FEMORAL ARTERY;  Surgeon: Conrad Billingsley, MD;  Location: [place];  Service: Vascular;  Laterality: Right;  . BASCILIC VEIN TRANSPOSITION Right 10/13/2017   Procedure: FIRST STAGE BRACHIAL VEIN TRANSPOSITION RIGHT ARM;  Surgeon: Conrad [place], MD;  Location: [place];  Service: Vascular;  Laterality: Right;  . BIOPSY  09/25/2017   Procedure: BIOPSY;  Surgeon: Ronnette Juniper, MD;  Location: [facility];  Service: Gastroenterology;;  . CARDIAC CATHETERIZATION Right 2017  . CARDIAC DEFIBRILLATOR PLACEMENT  2009  . DIALYSIS FISTULA CREATION Left 2003   "upper arm"  . DIALYSIS/PERMA CATHETER REMOVAL Left 09/01/2017   Procedure: DIALYSIS/PERMA CATHETER REMOVAL;  Surgeon: Conrad , MD;  Location: [place];  Service: Vascular;  Laterality: Left;  . ESOPHAGOGASTRODUODENOSCOPY (EGD) WITH PROPOFOL N/A 09/25/2017   Procedure: ESOPHAGOGASTRODUODENOSCOPY (EGD) WITH PROPOFOL;  Surgeon: Therisa Doyne,  Megan Salon, MD;  Location: [place];  Service: Gastroenterology;  Laterality: N/A;  . HERNIA REPAIR    . ICD LEAD REMOVAL Right 08/20/2017   Procedure: ICD EXTRACTION WITH C-ARM;  Surgeon: Evans Lance, MD;  Location: [facility] OR;  Service: Cardiovascular;  Laterality: Right;  DR VAN TRIGT TO BACK-UP  . icd removed    . INSERTION OF DIALYSIS CATHETER N/A 11/11/2016   Procedure: INSERTION OF TUNNELED DIALYSIS CATHETER;  Surgeon: Angelia Mould, MD;  Location: [place];  Service: Vascular;  Laterality: N/A;  . INSERTION OF  DIALYSIS CATHETER Left 08/24/2017   Procedure: INSERTION OF DIALYSIS CATHETER;  Surgeon: Conrad [place], MD;  Location: [place];  Service: Vascular;  Laterality: Left;  . INSERTION OF DIALYSIS CATHETER Left 09/01/2017   Procedure: INSERTION OF DIALYSIS CATHETER;  Surgeon: Conrad Mertzon, MD;  Location: [place];  Service: Vascular;  Laterality: Left;  . KIDNEY TRANSPLANT  2009  . KNEE SURGERY Bilateral 2000s   "drained fluid"  . LAPAROSCOPIC GASTRIC BANDING  2006  . PROSTATE BIOPSY  2009  . REVISON OF ARTERIOVENOUS FISTULA Left 5/46/2703   Procedure: PLICATION OF ARTERIOVENOUS FISTULA ANEURYSM;  Surgeon: Elam Dutch, MD;  Location: [place];  Service: Vascular;  Laterality: Left;  . REVISON OF ARTERIOVENOUS FISTULA Left 11/10/2016   Procedure: REVISON OF ARTERIOVENOUS FISTULA - LEFT UPPER ARM;  Surgeon: Elam Dutch, MD;  Location: [place];  Service: Vascular;  Laterality: Left;  . REVISON OF ARTERIOVENOUS FISTULA Left 08/24/2017   Procedure: REVISON OF ARTERIOVENOUS FISTULA LEFT;  Surgeon: Conrad Dawson, MD;  Location: [place];  Service: Vascular;  Laterality: Left;  . TEE WITHOUT CARDIOVERSION N/A 08/20/2017   Procedure: TRANSESOPHAGEAL ECHOCARDIOGRAM (TEE);  Surgeon: Evans Lance, MD;  Location: [place];  Service: Cardiovascular;  Laterality: N/A;  . THROMBECTOMY W/ EMBOLECTOMY Left 12/25/2014   Procedure: THROMBECTOMY ARTERIOVENOUS FISTULA;  Surgeon: Elam Dutch, MD;  Location: [place];  Service: Vascular;  Laterality: Left;  . THYROIDECTOMY  2011  . TOE AMPUTATION Bilateral     right 1st and  2nd digits; left 1st and 3rd digits"  . UMBILICAL HERNIA REPAIR  X 2  . VEIN HARVEST Left 08/24/2017   Procedure: VEIN HARVEST;  Surgeon: Conrad Hockessin, MD;  Location: [place];  Service: Vascular;  Laterality: Left;     reports that he has never smoked. He has never used smokeless tobacco. He reports that he drinks alcohol. He reports that he does not use drugs.  Allergies  Allergen Reactions  .  Enalaprilat Swelling and Other (See Comments)    Mouth swelling  . Vasotec [Enalapril] Swelling and Other (See Comments)    MOUTH SWELLING  . Iodinated Diagnostic Agents Nausea And Vomiting and Nausea Only    Family History  Problem Relation Age of Onset  . Heart failure Mother   . Hypertension Mother   . CAD Mother 8  . Emphysema Mother   . Hypertension Father   . Kidney failure Father      Prior to Admission medications   Medication Sig Start Date End Date Taking? Authorizing Provider  ambrisentan (LETAIRIS) 10 MG tablet Take 1 tablet (10 mg total) by mouth daily. 09/11/17  Yes Juanito Doom, MD  amiodarone (PACERONE) 200 MG tablet Take 200 mg by mouth every evening.    Yes [provider]  atorvastatin (LIPITOR) 40 MG tablet Take 1 tablet (40 mg total) by mouth every evening. 06/15/17  Yes Diallo, Abdoulaye,  MD  cinacalcet (SENSIPAR) 30 MG tablet Take 3 tablets (90 mg total) by mouth every evening. 09/26/17  Yes Enid Derry, [place], DO  levothyroxine (SYNTHROID, LEVOTHROID) 300 MCG tablet Take 1 tablet (300 mcg total) by mouth daily before breakfast. 09/26/17  Yes Enid Derry, [place], DO  midodrine (PROAMATINE) 10 MG tablet TAKE 1 TABLET (10 MG TOTAL) BY MOUTH 3 (THREE) TIMES DAILY. 10/08/17  Yes Minus Breeding, MD  multivitamin (RENA-VIT) TABS tablet TAKE 1 TABLET BY MOUTH EVERYDAY AT BEDTIME 10/26/17  Yes Diallo, Abdoulaye, MD  pantoprazole (PROTONIX) 40 MG tablet Take 1 tablet (40 mg total) by mouth 2 (two) times daily before a meal. 09/26/17  Yes Enid Derry, [place], DO  sucralfate (CARAFATE) 1 g tablet TAKE 1 TABLET (1 G TOTAL) BY MOUTH 2 (TWO) TIMES DAILY. 10/26/17  Yes Diallo, Abdoulaye, MD  tadalafil (ADCIRCA/CIALIS) 20 MG tablet Take 20 mg by mouth daily.  10/06/17  Yes [provider]  warfarin (COUMADIN) 6 MG tablet TAKE 1 TO 1.5 TABLETS BY MOUTH DAILY AS DIRECTED BY COUMADIN CLINIC Patient taking differently: Take 6 mg by mouth daily on Sunday. Take 9 mg by mouth daily  on all other days. 10/12/17  Yes Croitoru, Mihai, MD  calcium acetate (PHOSLO) 667 MG capsule Take 2 capsules (1,334 mg total) by mouth 3 (three) times daily with meals. Patient not taking: Reported on 11/02/2017 09/26/17   Shirley, [place], DO    Physical Exam: Vitals:   11/02/17 1711 11/02/17 1748 11/02/17 1831  BP: (!) 91/52  (!) 96/93  Pulse: 77  78  Resp:   20  Temp:  98.3 F (36.8 C)   TempSrc:  Oral   SpO2: 99%  100%  Weight:   124.3 kg (274 lb)  Height:   6\' 1"  (1.854 m)    Constitutional: NAD, calm, comfortable Eyes: PERRL, lids and conjunctivae normal ENMT: Mucous membranes are moist. Posterior pharynx clear of any exudate or lesions.Normal dentition.  Neck: normal, supple, no masses, no thyromegaly Respiratory: clear to auscultation bilaterally, no wheezing, no crackles. Normal respiratory effort. No accessory muscle use.  Cardiovascular: Regular rate and rhythm, no murmurs / rubs / gallops. No extremity edema. 2+ pedal pulses. No carotid bruits.  Abdomen: no tenderness, no masses palpated. No hepatosplenomegaly. Bowel sounds positive.  Musculoskeletal: no clubbing / cyanosis. No joint deformity upper and lower extremities. Good ROM, no contractures. Normal muscle tone.  Skin: no rashes, lesions, ulcers. No induration Neurologic: CN 2-12 grossly intact. Sensation intact, DTR normal. Strength 5/5 in all 4.  Psychiatric: Normal judgment and insight. Alert and oriented x 3. Normal mood.    Labs on Admission: I have personally reviewed following labs and imaging studies  CBC: Recent Labs  Lab 10/29/17 1550  HGB 8.1*   Basic Metabolic Panel: No results for input(s): NA, K, CL, CO2, GLUCOSE, BUN, CREATININE, CALCIUM, MG, PHOS in the last 168 hours. GFR: CrCl cannot be calculated (Patient's most recent lab result is older than the maximum 21 days allowed.). Liver Function Tests: No results for input(s): AST, ALT, ALKPHOS, BILITOT, PROT, ALBUMIN in the last 168 hours. No  results for input(s): LIPASE, AMYLASE in the last 168 hours. No results for input(s): AMMONIA in the last 168 hours. Coagulation Profile: Recent Labs  Lab 11/02/17 1833  INR 2.05   Cardiac Enzymes: No results for input(s): CKTOTAL, CKMB, CKMBINDEX, TROPONINI in the last 168 hours. BNP (last 3 results) No results for input(s): PROBNP in the last 8760 hours. HbA1C: No results for input(s): HGBA1C  in the last 72 hours. CBG: No results for input(s): GLUCAP in the last 168 hours. Lipid Profile: No results for input(s): CHOL, HDL, LDLCALC, TRIG, CHOLHDL, LDLDIRECT in the last 72 hours. Thyroid Function Tests: No results for input(s): TSH, T4TOTAL, FREET4, T3FREE, THYROIDAB in the last 72 hours. Anemia Panel: No results for input(s): VITAMINB12, FOLATE, FERRITIN, TIBC, IRON, RETICCTPCT in the last 72 hours. Urine analysis: No results found for: COLORURINE, APPEARANCEUR, LABSPEC, PHURINE, GLUCOSEU, HGBUR, BILIRUBINUR, KETONESUR, PROTEINUR, UROBILINOGEN, NITRITE, LEUKOCYTESUR  Radiological Exams on Admission: No results found.  EKG: Independently reviewed.  Assessment/Plan Principal Problem:   Septic arthritis of knee, right (HCC) Active Problems:   Atrial fibrillation [I48.91]   Pulmonary hypertension (HCC)   ESRD (end stage renal disease) on hemodialysis   Infected defibrillator [facility])   Dialysis AV fistula infection, sequela    1. R knee septic arthritis - 1. Given tap, and especially given recent MSSA multifocal infection history including bacteremia, septic arthritis is a strong possibility. 2. Dr. Alvan Dame to see in AM 3. NPO after MN 4. Holding Coumadin 5. Will leave on vanc started in ED for the moment 6. BCx and knee tap Cx pending 7. If they come back positive (especially if positive for MSSA), next step would also include ID consult! 2. ESRD - 1. Dialysis earlier today 2. Call nephrology in AM for routine dialysis during IP stay 3. Continue Midodrine for chronic  hypotension 4. Continue Sensipar 5. Looks like he isnt taking phoslo, will defer this to nephrology 3. A.Fib - 1. Continue amiodarone 2. Holding coumadin 4. PAH - 1. Continue letaris 2. Continue Tadalafil 5. Recent MSSA infections - 1. LUE Dialysis Fistula ligated 2. AICD removed 3. He does have a cath in place through which he is currently getting dialysis 4. Does have a newly created RUE fistula that is maturing.  DVT prophylaxis: SCDs Code Status: Full Family Communication: Family at bedside Disposition Plan: Home after admit Consults called: None Admission status: Admit to inpatient   Etta Quill DO [facility] Pager [phone] Only works nights!  If 7AM-7PM, please contact the primary day team physician taking care of patient  [email or web address] Password [facility]  11/02/2017, 10:58 PM

## 2017-11-02 NOTE — ED Triage Notes (Signed)
Pt reports that he noticed pain in right calf starting this morning.  He unsure if swelling due to generalized swelling he normally has due to being a dialysis patient. Denies any falls or injuries. Pt is currently taking Coumadin.

## 2017-11-02 NOTE — ED Notes (Signed)
RN attempt at blood draw unsuccessful. NT to try.

## 2017-11-02 NOTE — ED Provider Notes (Signed)
[facility] DEPT Provider Note   CSN: [id number] Arrival date & time: 11/02/17  1654     History   Chief Complaint Chief Complaint  Patient presents with  . Leg Pain    right calf     HPI Adam [name]. is a 57 y.o. male.  57 year old male presents with swelling to his right calf x1 day.  Denies any chest pain or shortness of breath.  No pleuritic component.  Patient has history of A. fib and does take Coumadin.  Also has chronic recurring right knee swelling which she says is been going on for several weeks.  Pain characterizes sharp and worse with standing.  No treatment used prior to arrival.     Past Medical History:  Diagnosis Date  . AICD (automatic cardioverter/defibrillator) present    Medtronic   . Aortic stenosis    moderate AS by 08/2016 echo  . Arthritis    "knees" (08/05/2016)  . Atrial fibrillation ([place])   . ESRD (end stage renal disease) on dialysis ([place])    "M, T, W, T, F; NX stage hemodialysis; I do it at home" (08/05/2016)  . Great toe amputation status ([place])    status post left hallux amputation 08/01/14  . History of blood transfusion 2009   "S/P biopsy for prostate cancer check"  . Hypertension   . Hypothyroidism (acquired)   . Mitral stenosis    moderate mitral stenosis  . Nonischemic cardiomyopathy ([place])   . Pneumonia 2014; 08/05/2016  . PONV (postoperative nausea and vomiting)   . Pulmonary HTN ([place])   . Renal insufficiency   . Thyroid cancer [facility]) 2011    Patient Active Problem List   Diagnosis Date Noted  . Mass 10/29/2017  . Near syncope 09/24/2017  . Syncope, cardiogenic   . ESRD on hemodialysis ([place])   . Anemia 09/16/2017  . Diarrhea 09/16/2017  . Tarry stools 09/16/2017  . Acute encephalopathy   . Hypotension   . Complication of arteriovenous dialysis fistula   . Stage 5 chronic kidney disease on chronic dialysis ([place])   . 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 MSSA bacteremia 08/18/2017  .  Sepsis ([place]) 08/17/2017  . Lymphadenopathy, mediastinal 04/10/2017  . Hypothyroidism 10/09/2016  . Acute respiratory failure with hypoxia ([place]) 08/06/2016  . HCAP (healthcare-associated pneumonia) 08/05/2016  . Thyroid cancer ([place]) 06/12/2016  . Epistaxis 03/04/2016  . Long term current use of amiodarone 01/10/2016  . Critical lower limb ischemia 12/04/2015  . Carotid artery stenosis 05/24/2015  . Excess skin 05/24/2015  . Osteomyelitis of left foot ([place]) 04/30/2015  . Status post amputation 04/19/2015  . Wound dehiscence 04/19/2015  . Sinus congestion 03/08/2015  . Bleeding pseudoaneurysm of left brachiocephalic arteriovenous fistula ([place]) 12/24/2014  . Pure hypercholesterolemia 11/09/2014  . History of thyroid cancer 11/07/2014  . Obesity 11/07/2014  . Healthcare maintenance 11/07/2014  . H/O malignant neoplasm of thyroid 11/07/2014  . Toe amputation status ([place]) 10/13/2014  . ICD (implantable cardioverter-defibrillator) in place 07/25/2014  . ESRD (end stage renal disease) on hemodialysis 07/25/2014  . End-stage renal disease ([place]) 07/25/2014  . Automatic implantable cardioverter-defibrillator in situ 07/25/2014  . Pulmonary hypertension ([place]) January 12, 202016  . Secondary pulmonary hypertension January 12, 202016  . Atrial fibrillation [I48.91] 05/24/2014  . Long term current use of anticoagulant therapy 05/24/2014    Past Surgical History:  Procedure Laterality Date  . ARTERIAL LINE INSERTION Right 08/24/2017   Procedure: ARTERIAL LINE INSERTION INTO RIGHT FEMORAL ARTERY;  Surgeon: Bridgett Larsson,  Jannette Fogo, MD;  Location: [facility];  Service: Vascular;  Laterality: Right;  . BASCILIC VEIN TRANSPOSITION Right 10/13/2017   Procedure: FIRST STAGE BRACHIAL VEIN TRANSPOSITION RIGHT ARM;  Surgeon: Conrad Arenzville, MD;  Location: [facility];  Service: Vascular;  Laterality: Right;  . BIOPSY  09/25/2017   Procedure: BIOPSY;  Surgeon: Ronnette Juniper, MD;  Location: [facility] ENDOSCOPY;  Service: Gastroenterology;;  . CARDIAC  CATHETERIZATION Right 2017  . CARDIAC DEFIBRILLATOR PLACEMENT  2009  . DIALYSIS FISTULA CREATION Left 2003   "upper arm"  . DIALYSIS/PERMA CATHETER REMOVAL Left 09/01/2017   Procedure: DIALYSIS/PERMA CATHETER REMOVAL;  Surgeon: Conrad Carver, MD;  Location: [facility];  Service: Vascular;  Laterality: Left;  . ESOPHAGOGASTRODUODENOSCOPY (EGD) WITH PROPOFOL N/A 09/25/2017   Procedure: ESOPHAGOGASTRODUODENOSCOPY (EGD) WITH PROPOFOL;  Surgeon: Ronnette Juniper, MD;  Location: [place];  Service: Gastroenterology;  Laterality: N/A;  . HERNIA REPAIR    . ICD LEAD REMOVAL Right 08/20/2017   Procedure: ICD EXTRACTION WITH C-ARM;  Surgeon: Evans Lance, MD;  Location: [facility] OR;  Service: Cardiovascular;  Laterality: Right;  DR VAN TRIGT TO BACK-UP  . icd removed    . INSERTION OF DIALYSIS CATHETER N/A 11/11/2016   Procedure: INSERTION OF TUNNELED DIALYSIS CATHETER;  Surgeon: Angelia Mould, MD;  Location: [place];  Service: Vascular;  Laterality: N/A;  . INSERTION OF DIALYSIS CATHETER Left 08/24/2017   Procedure: INSERTION OF DIALYSIS CATHETER;  Surgeon: Conrad Hydetown, MD;  Location: [place];  Service: Vascular;  Laterality: Left;  . INSERTION OF DIALYSIS CATHETER Left 09/01/2017   Procedure: INSERTION OF DIALYSIS CATHETER;  Surgeon: Conrad Waynesville, MD;  Location: [facility];  Service: Vascular;  Laterality: Left;  . KIDNEY TRANSPLANT  2009  . KNEE SURGERY Bilateral 2000s   "drained fluid"  . LAPAROSCOPIC GASTRIC BANDING  2006  . PROSTATE BIOPSY  2009  . REVISON OF ARTERIOVENOUS FISTULA Left 05/17/2692   Procedure: PLICATION OF ARTERIOVENOUS FISTULA ANEURYSM;  Surgeon: Elam Dutch, MD;  Location: [place];  Service: Vascular;  Laterality: Left;  . REVISON OF ARTERIOVENOUS FISTULA Left 11/10/2016   Procedure: REVISON OF ARTERIOVENOUS FISTULA - LEFT UPPER ARM;  Surgeon: Elam Dutch, MD;  Location: [place];  Service: Vascular;  Laterality: Left;  . REVISON OF ARTERIOVENOUS FISTULA Left 08/24/2017   Procedure:  REVISON OF ARTERIOVENOUS FISTULA LEFT;  Surgeon: Conrad Falcon, MD;  Location: [place];  Service: Vascular;  Laterality: Left;  . TEE WITHOUT CARDIOVERSION N/A 08/20/2017   Procedure: TRANSESOPHAGEAL ECHOCARDIOGRAM (TEE);  Surgeon: Evans Lance, MD;  Location: [place];  Service: Cardiovascular;  Laterality: N/A;  . THROMBECTOMY W/ EMBOLECTOMY Left 12/25/2014   Procedure: THROMBECTOMY ARTERIOVENOUS FISTULA;  Surgeon: Elam Dutch, MD;  Location: [place];  Service: Vascular;  Laterality: Left;  . THYROIDECTOMY  2011  . TOE AMPUTATION Bilateral     right 1st and  2nd digits; left 1st and 3rd digits"  . UMBILICAL HERNIA REPAIR  X 2  . VEIN HARVEST Left 08/24/2017   Procedure: VEIN HARVEST;  Surgeon: Conrad Henderson, MD;  Location: [facility] OR;  Service: Vascular;  Laterality: Left;        Home Medications    Prior to Admission medications   Medication Sig Start Date End Date Taking? Authorizing Provider  ambrisentan (LETAIRIS) 10 MG tablet Take 1 tablet (10 mg total) by mouth daily. 09/11/17   Juanito Doom, MD  amiodarone (PACERONE) 200 MG tablet Take 200 mg by mouth  every evening.     [provider]  atorvastatin (LIPITOR) 40 MG tablet Take 1 tablet (40 mg total) by mouth every evening. 06/15/17   Diallo, Earna Coder, MD  calcium acetate (PHOSLO) 667 MG capsule Take 2 capsules (1,334 mg total) by mouth 3 (three) times daily with meals. Patient not taking: Reported on 11/02/2017 09/26/17   Shirley, [place], DO  cinacalcet (SENSIPAR) 30 MG tablet Take 3 tablets (90 mg total) by mouth every evening. 09/26/17   Shirley, [place], DO  levothyroxine (SYNTHROID, LEVOTHROID) 300 MCG tablet Take 1 tablet (300 mcg total) by mouth daily before breakfast. 09/26/17   Shirley, [place], DO  midodrine (PROAMATINE) 10 MG tablet TAKE 1 TABLET (10 MG TOTAL) BY MOUTH 3 (THREE) TIMES DAILY. 10/08/17   Minus Breeding, MD  multivitamin (RENA-VIT) TABS tablet TAKE 1 TABLET BY MOUTH EVERYDAY AT BEDTIME 10/26/17   Diallo,  Earna Coder, MD  pantoprazole (PROTONIX) 40 MG tablet Take 1 tablet (40 mg total) by mouth 2 (two) times daily before a meal. 09/26/17   Enid Derry, [place], DO  sucralfate (CARAFATE) 1 g tablet TAKE 1 TABLET (1 G TOTAL) BY MOUTH 2 (TWO) TIMES DAILY. 10/26/17   Diallo, Earna Coder, MD  tadalafil (ADCIRCA/CIALIS) 20 MG tablet Take 20 mg by mouth daily.  10/06/17   [provider]  warfarin (COUMADIN) 6 MG tablet TAKE 1 TO 1.5 TABLETS BY MOUTH DAILY AS DIRECTED BY COUMADIN CLINIC Patient taking differently: Take 6 mg by mouth daily on Sunday. Take 9 mg by mouth daily on all other days. 10/12/17   Croitoru, Dani Gobble, MD    Family History Family History  Problem Relation Age of Onset  . Heart failure Mother   . Hypertension Mother   . CAD Mother 71  . Emphysema Mother   . Hypertension Father   . Kidney failure Father     Social History Social History   Tobacco Use  . Smoking status: Never Smoker  . Smokeless tobacco: Never Used  Substance Use Topics  . Alcohol use: Yes    Alcohol/week: 0.0 oz    Comment: social  . Drug use: No     Allergies   Enalaprilat; Vasotec [enalapril]; and Iodinated diagnostic agents   Review of Systems Review of Systems  All other systems reviewed and are negative.    Physical Exam Updated Vital Signs BP (!) 91/52 (BP Location: Left Arm)   Pulse 77   Temp 98.3 F (36.8 C) (Oral)   SpO2 99%   Physical Exam  Constitutional: He is oriented to person, place, and time. He appears well-developed and well-nourished.  Non-toxic appearance. No distress.  HENT:  Head: Normocephalic and atraumatic.  Eyes: Pupils are equal, round, and reactive to light. Conjunctivae, EOM and lids are normal.  Neck: Normal range of motion. Neck supple. No tracheal deviation present. No thyroid mass present.  Cardiovascular: Normal rate, regular rhythm and normal heart sounds. Exam reveals no gallop.  No murmur heard. Pulmonary/Chest: Effort normal and breath sounds normal.  No stridor. No respiratory distress. He has no decreased breath sounds. He has no wheezes. He has no rhonchi. He has no rales.  Abdominal: Soft. Normal appearance and bowel sounds are normal. He exhibits no distension. There is no tenderness. There is no rebound and no CVA tenderness.  Musculoskeletal: Normal range of motion. He exhibits no edema.       Right lower leg: He exhibits tenderness and swelling.       Legs: Neurological: He is alert and oriented to  person, place, and time. He has normal strength. No cranial nerve deficit or sensory deficit. GCS eye subscore is 4. GCS verbal subscore is 5. GCS motor subscore is 6.  Skin: Skin is warm and dry. No abrasion and no rash noted.  Psychiatric: He has a normal mood and affect. His speech is normal and behavior is normal.  Nursing note and vitals reviewed.    ED Treatments / Results  Labs (all labs ordered are listed, but only abnormal results are displayed) Labs Reviewed  PROTIME-INR    EKG None  Radiology No results found.  Procedures Procedures (including critical care time)  Medications Ordered in ED Medications - No data to display   Initial Impression / Assessment and Plan / ED Course  I have reviewed the triage vital signs and the nursing notes.  Pertinent labs & imaging results that were available during my care of the patient were reviewed by me and considered in my medical decision making (see chart for details).     After consent was obtained, using sterile technique the right knee was prepped and plain Lidocaine 1% was used as local anesthetic. The joint was entered and 40 ml's of straw colored fluid was withdrawn and sent for Gram stain, cell count, culture. The procedure was well tolerated.  The patient is asked to continue to rest the joint for a few more days before resuming regular activities.  It may be more painful for the first 1-2 days.  Watch for fever, or increased swelling or persistent pain in the  joint. Call or return to clinic prn if such symptoms occur or there is failure to improve as anticipated.   10:34 PM Patient's cell count was positive for possible infection.  Discussed case with Dr. Ihor Gully who recommends admission to medicine and start the patient on IV vancomycin.  Final Clinical Impressions(s) / ED Diagnoses   Final diagnoses:  None    ED Discharge Orders    None       Lacretia Leigh, MD 11/02/17 2235

## 2017-11-03 ENCOUNTER — Ambulatory Visit (HOSPITAL_COMMUNITY): Admission: RE | Admit: 2017-11-03 | Payer: Medicare HMO | Source: Ambulatory Visit

## 2017-11-03 ENCOUNTER — Inpatient Hospital Stay (HOSPITAL_COMMUNITY): Payer: Medicare HMO

## 2017-11-03 ENCOUNTER — Ambulatory Visit: Payer: Medicare HMO | Admitting: Cardiology

## 2017-11-03 ENCOUNTER — Other Ambulatory Visit: Payer: Self-pay

## 2017-11-03 DIAGNOSIS — I132 Hypertensive heart and chronic kidney disease with heart failure and with stage 5 chronic kidney disease, or end stage renal disease: Secondary | ICD-10-CM | POA: Diagnosis not present

## 2017-11-03 DIAGNOSIS — E440 Moderate protein-calorie malnutrition: Secondary | ICD-10-CM | POA: Diagnosis not present

## 2017-11-03 DIAGNOSIS — M009 Pyogenic arthritis, unspecified: Secondary | ICD-10-CM | POA: Diagnosis not present

## 2017-11-03 DIAGNOSIS — I82B12 Acute embolism and thrombosis of left subclavian vein: Secondary | ICD-10-CM | POA: Diagnosis not present

## 2017-11-03 DIAGNOSIS — Y832 Surgical operation with anastomosis, bypass or graft as the cause of abnormal reaction of the patient, or of later complication, without mention of misadventure at the time of the procedure: Secondary | ICD-10-CM | POA: Diagnosis not present

## 2017-11-03 DIAGNOSIS — I5032 Chronic diastolic (congestive) heart failure: Secondary | ICD-10-CM | POA: Diagnosis not present

## 2017-11-03 DIAGNOSIS — T827XXS Infection and inflammatory reaction due to other cardiac and vascular devices, implants and grafts, sequela: Secondary | ICD-10-CM | POA: Diagnosis not present

## 2017-11-03 DIAGNOSIS — I428 Other cardiomyopathies: Secondary | ICD-10-CM | POA: Diagnosis not present

## 2017-11-03 DIAGNOSIS — N186 End stage renal disease: Secondary | ICD-10-CM | POA: Diagnosis not present

## 2017-11-03 DIAGNOSIS — N2581 Secondary hyperparathyroidism of renal origin: Secondary | ICD-10-CM | POA: Diagnosis not present

## 2017-11-03 LAB — CBC WITH DIFFERENTIAL/PLATELET
BASOS ABS: 0 10*3/uL (ref 0.0–0.1)
BASOS PCT: 0 %
Eosinophils Absolute: 0 10*3/uL (ref 0.0–0.7)
Eosinophils Relative: 1 %
HCT: 31.4 % — ABNORMAL LOW (ref 39.0–52.0)
Hemoglobin: 9.6 g/dL — ABNORMAL LOW (ref 13.0–17.0)
Lymphocytes Relative: 6 %
Lymphs Abs: 0.3 10*3/uL — ABNORMAL LOW (ref 0.7–4.0)
MCH: 28.4 pg (ref 26.0–34.0)
MCHC: 30.6 g/dL (ref 30.0–36.0)
MCV: 92.9 fL (ref 78.0–100.0)
MONO ABS: 0.5 10*3/uL (ref 0.1–1.0)
Monocytes Relative: 10 %
NEUTROS ABS: 4.1 10*3/uL (ref 1.7–7.7)
Neutrophils Relative %: 83 %
PLATELETS: 115 10*3/uL — AB (ref 150–400)
RBC: 3.38 MIL/uL — ABNORMAL LOW (ref 4.22–5.81)
RDW: 19.1 % — AB (ref 11.5–15.5)
WBC: 4.9 10*3/uL (ref 4.0–10.5)

## 2017-11-03 LAB — BASIC METABOLIC PANEL
ANION GAP: 12 (ref 5–15)
BUN: 12 mg/dL (ref 6–20)
CALCIUM: 8.2 mg/dL — AB (ref 8.9–10.3)
CO2: 29 mmol/L (ref 22–32)
Chloride: 97 mmol/L — ABNORMAL LOW (ref 98–111)
Creatinine, Ser: 5.2 mg/dL — ABNORMAL HIGH (ref 0.61–1.24)
GFR calc Af Amer: 13 mL/min — ABNORMAL LOW (ref >60)
GFR, EST NON AFRICAN AMERICAN: 11 mL/min — AB (ref >60)
GLUCOSE: 70 mg/dL (ref 70–99)
Potassium: 3.2 mmol/L — ABNORMAL LOW (ref 3.5–5.1)
SODIUM: 138 mmol/L (ref 135–145)

## 2017-11-03 LAB — MRSA PCR SCREENING: MRSA by PCR: NEGATIVE

## 2017-11-03 MED ORDER — OXYCODONE HCL 5 MG PO TABS
5.0000 mg | ORAL_TABLET | ORAL | Status: DC | PRN
  Administered 2017-11-03: 5 mg via ORAL
  Filled 2017-11-03: qty 1

## 2017-11-03 MED ORDER — TRAMADOL HCL 50 MG PO TABS: 50.0000 mg | ORAL_TABLET | Freq: Two times a day (BID) | ORAL | Status: DC | PRN

## 2017-11-03 MED ORDER — CHLORHEXIDINE GLUCONATE CLOTH 2 % EX PADS
6.0000 | MEDICATED_PAD | Freq: Every day | CUTANEOUS | Status: DC
  Administered 2017-11-04 – 2017-11-10 (×3): 6 via TOPICAL

## 2017-11-03 MED ORDER — VANCOMYCIN HCL IN DEXTROSE 1-5 GM/200ML-% IV SOLN
1000.0000 mg | Freq: Once | INTRAVENOUS | Status: AC
  Administered 2017-11-03: 1000 mg via INTRAVENOUS
  Filled 2017-11-03: qty 200

## 2017-11-03 MED ORDER — FENTANYL CITRATE (PF) 100 MCG/2ML IJ SOLN
12.5000 ug | INTRAMUSCULAR | Status: DC | PRN
  Administered 2017-11-03: 12.5 ug via INTRAVENOUS
  Filled 2017-11-03: qty 2

## 2017-11-03 MED ORDER — FENTANYL CITRATE (PF) 100 MCG/2ML IJ SOLN: 12.5000 ug | INTRAMUSCULAR | Status: DC | PRN

## 2017-11-03 MED ORDER — CALCIUM ACETATE (PHOS BINDER) 667 MG PO CAPS
1334.0000 mg | ORAL_CAPSULE | Freq: Three times a day (TID) | ORAL | Status: DC
  Filled 2017-11-03 (×8): qty 2

## 2017-11-03 MED ORDER — VANCOMYCIN HCL IN DEXTROSE 1-5 GM/200ML-% IV SOLN
1000.0000 mg | INTRAVENOUS | Status: DC
  Administered 2017-11-04: 1000 mg via INTRAVENOUS
  Filled 2017-11-03: qty 200

## 2017-11-03 NOTE — Progress Notes (Signed)
Patient ID: Adam Alm., male   DOB: 07-07-1960, 57 y.o.   MRN: [id number]  Initial consult pending but received call regarding his presentation  Right knee aspiration: 45000 WBCs Crystals negative on initial screen Gram stain negative initially  Inflammatory versus septic knee  I have asked one of my partners who will be at [facility] today to evaluate knee to determine best course  Asked ED team to start Vanc empirically due to aspiration having been performed Also recommended trying toradol to help with pain Renal dialysis patient HD - MWF

## 2017-11-03 NOTE — Consult Note (Signed)
ORTHOPAEDIC CONSULTATION  REQUESTING PHYSICIAN: Lind Covert, MD  PCP:  [name], MD  Chief Complaint: Right knee pain and swelling.  HPI: Adam [name]. is a 57 y.o. male who complains of 3 days of worsening right knee and calf pain.  He has a well-known history of bilateral knee arthritis and intermittent effusions managed by my partner Dr. Lyla Glassing in the outpatient setting.  He states that this feels a little different in the right knee with more calf pain that he is accustomed to.  He had the knee aspirated in the emergency department and pulmonary data demonstrates a knee effusion with leukocytosis and a white cell count of 44,000 but negative bacteria on Gram stain.  Cultures are pending but negative x1 day.  He states he does feel somewhat better after being initiated on vancomycin.  He does continue to have knee pain and no longer has the calf and anterior tibial pain.   Past Medical History:  Diagnosis Date  . AICD (automatic cardioverter/defibrillator) present    Medtronic   . Aortic stenosis    moderate AS by 08/2016 echo  . Arthritis    "knees" (08/05/2016)  . Atrial fibrillation ([place])   . ESRD (end stage renal disease) on dialysis ([place])    "M, T, W, T, F; NX stage hemodialysis; I do it at home" (08/05/2016)  . Great toe amputation status ([place])    status post left hallux amputation 08/01/14  . History of blood transfusion 2009   "S/P biopsy for prostate cancer check"  . Hypertension   . Hypothyroidism (acquired)   . Mitral stenosis    moderate mitral stenosis  . Nonischemic cardiomyopathy ([place])   . Pneumonia 2014; 08/05/2016  . PONV (postoperative nausea and vomiting)   . Pulmonary HTN ([place])   . Renal insufficiency   . Thyroid cancer ([place]) 2011   Past Surgical History:  Procedure Laterality Date  . ARTERIAL LINE INSERTION Right 08/24/2017   Procedure: ARTERIAL LINE INSERTION INTO RIGHT FEMORAL ARTERY;  Surgeon: Conrad Norwich, MD;   Location: [facility];  Service: Vascular;  Laterality: Right;  . BASCILIC VEIN TRANSPOSITION Right 10/13/2017   Procedure: FIRST STAGE BRACHIAL VEIN TRANSPOSITION RIGHT ARM;  Surgeon: Conrad [place]ab, MD;  Location: [place];  Service: Vascular;  Laterality: Right;  . BIOPSY  09/25/2017   Procedure: BIOPSY;  Surgeon: Ronnette Juniper, MD;  Location: [facility] ENDOSCOPY;  Service: Gastroenterology;;  . CARDIAC CATHETERIZATION Right 2017  . CARDIAC DEFIBRILLATOR PLACEMENT  2009  . DIALYSIS FISTULA CREATION Left 2003   "upper arm"  . DIALYSIS/PERMA CATHETER REMOVAL Left 09/01/2017   Procedure: DIALYSIS/PERMA CATHETER REMOVAL;  Surgeon: Conrad Prospect, MD;  Location: [place];  Service: Vascular;  Laterality: Left;  . ESOPHAGOGASTRODUODENOSCOPY (EGD) WITH PROPOFOL N/A 09/25/2017   Procedure: ESOPHAGOGASTRODUODENOSCOPY (EGD) WITH PROPOFOL;  Surgeon: Ronnette Juniper, MD;  Location: [place];  Service: Gastroenterology;  Laterality: N/A;  . HERNIA REPAIR    . ICD LEAD REMOVAL Right 08/20/2017   Procedure: ICD EXTRACTION WITH C-ARM;  Surgeon: Evans Lance, MD;  Location: [facility] OR;  Service: Cardiovascular;  Laterality: Right;  DR VAN TRIGT TO BACK-UP  . icd removed    . INSERTION OF DIALYSIS CATHETER N/A 11/11/2016   Procedure: INSERTION OF TUNNELED DIALYSIS CATHETER;  Surgeon: Angelia Mould, MD;  Location: [place];  Service: Vascular;  Laterality: N/A;  . INSERTION OF DIALYSIS CATHETER Left 08/24/2017   Procedure: INSERTION OF DIALYSIS CATHETER;  Surgeon: Adele Barthel  L, MD;  Location: [place];  Service: Vascular;  Laterality: Left;  . INSERTION OF DIALYSIS CATHETER Left 09/01/2017   Procedure: INSERTION OF DIALYSIS CATHETER;  Surgeon: Conrad Florence, MD;  Location: [place];  Service: Vascular;  Laterality: Left;  . KIDNEY TRANSPLANT  2009  . KNEE SURGERY Bilateral 2000s   "drained fluid"  . LAPAROSCOPIC GASTRIC BANDING  2006  . PROSTATE BIOPSY  2009  . REVISON OF ARTERIOVENOUS FISTULA Left 4/48/1856   Procedure: PLICATION OF  ARTERIOVENOUS FISTULA ANEURYSM;  Surgeon: Elam Dutch, MD;  Location: [place];  Service: Vascular;  Laterality: Left;  . REVISON OF ARTERIOVENOUS FISTULA Left 11/10/2016   Procedure: REVISON OF ARTERIOVENOUS FISTULA - LEFT UPPER ARM;  Surgeon: Elam Dutch, MD;  Location: [place];  Service: Vascular;  Laterality: Left;  . REVISON OF ARTERIOVENOUS FISTULA Left 08/24/2017   Procedure: REVISON OF ARTERIOVENOUS FISTULA LEFT;  Surgeon: Conrad Jeffers[place]own, MD;  Location: [place];  Service: Vascular;  Laterality: Left;  . TEE WITHOUT CARDIOVERSION N/A 08/20/2017   Procedure: TRANSESOPHAGEAL ECHOCARDIOGRAM (TEE);  Surgeon: Evans Lance, MD;  Location: [place];  Service: Cardiovascular;  Laterality: N/A;  . THROMBECTOMY W/ EMBOLECTOMY Left 12/25/2014   Procedure: THROMBECTOMY ARTERIOVENOUS FISTULA;  Surgeon: Elam Dutch, MD;  Location: [place];  Service: Vascular;  Laterality: Left;  . THYROIDECTOMY  2011  . TOE AMPUTATION Bilateral     right 1st and  2nd digits; left 1st and 3rd digits"  . UMBILICAL HERNIA REPAIR  X 2  . VEIN HARVEST Left 08/24/2017   Procedure: VEIN HARVEST;  Surgeon: Conrad Hudson, MD;  Location: [facility] OR;  Service: Vascular;  Laterality: Left;   Social History   Socioeconomic History  . Marital status: Married    Spouse name: Not on file  . Number of children: 1  . Years of education: Not on file  . Highest education level: Not on file  Occupational History  . Not on file  Social Needs  . Financial resource strain: Not on file  . Food insecurity:    Worry: Not on file    Inability: Not on file  . Transportation needs:    Medical: Not on file    Non-medical: Not on file  Tobacco Use  . Smoking status: Never Smoker  . Smokeless tobacco: Never Used  Substance and Sexual Activity  . Alcohol use: Yes    Alcohol/week: 0.0 oz    Comment: social  . Drug use: No  . Sexual activity: Yes  Lifestyle  . Physical activity:    Days per week: Not on file    Minutes per session:  Not on file  . Stress: Not on file  Relationships  . Social connections:    Talks on phone: Not on file    Gets together: Not on file    Attends religious service: Not on file    Active member of club or organization: Not on file    Attends meetings of clubs or organizations: Not on file    Relationship status: Not on file  Other Topics Concern  . Not on file  Social History Narrative   Retired Archivist.     Family History  Problem Relation Age of Onset  . Heart failure Mother   . Hypertension Mother   . CAD Mother 41  . Emphysema Mother   . Hypertension Father   . Kidney failure Father    Allergies  Allergen Reactions  .  Enalaprilat Swelling and Other (See Comments)    Mouth swelling  . Vasotec [Enalapril] Swelling and Other (See Comments)    MOUTH SWELLING  . Iodinated Diagnostic Agents Nausea And Vomiting and Nausea Only   Prior to Admission medications   Medication Sig Start Date End Date Taking? Authorizing Provider  ambrisentan (LETAIRIS) 10 MG tablet Take 1 tablet (10 mg total) by mouth daily. 09/11/17  Yes Juanito Doom, MD  amiodarone (PACERONE) 200 MG tablet Take 200 mg by mouth every evening.    Yes [provider]  atorvastatin (LIPITOR) 40 MG tablet Take 1 tablet (40 mg total) by mouth every evening. 06/15/17  Yes Diallo, Abdoulaye, MD  cinacalcet (SENSIPAR) 30 MG tablet Take 3 tablets (90 mg total) by mouth every evening. 09/26/17  Yes Enid Derry, [place], DO  levothyroxine (SYNTHROID, LEVOTHROID) 300 MCG tablet Take 1 tablet (300 mcg total) by mouth daily before breakfast. 09/26/17  Yes Enid Derry, [place], DO  midodrine (PROAMATINE) 10 MG tablet TAKE 1 TABLET (10 MG TOTAL) BY MOUTH 3 (THREE) TIMES DAILY. 10/08/17  Yes Minus Breeding, MD  multivitamin (RENA-VIT) TABS tablet TAKE 1 TABLET BY MOUTH EVERYDAY AT BEDTIME 10/26/17  Yes Diallo, Abdoulaye, MD  pantoprazole (PROTONIX) 40 MG tablet Take 1 tablet (40 mg total) by mouth 2 (two) times daily  before a meal. 09/26/17  Yes Enid Derry, [place], DO  sucralfate (CARAFATE) 1 g tablet TAKE 1 TABLET (1 G TOTAL) BY MOUTH 2 (TWO) TIMES DAILY. 10/26/17  Yes Diallo, Abdoulaye, MD  tadalafil (ADCIRCA/CIALIS) 20 MG tablet Take 20 mg by mouth daily.  10/06/17  Yes [provider]  warfarin (COUMADIN) 6 MG tablet TAKE 1 TO 1.5 TABLETS BY MOUTH DAILY AS DIRECTED BY COUMADIN CLINIC Patient taking differently: Take 6 mg by mouth daily on Sunday. Take 9 mg by mouth daily on all other days. 10/12/17  Yes Croitoru, Mihai, MD  calcium acetate (PHOSLO) 667 MG capsule Take 2 capsules (1,334 mg total) by mouth 3 (three) times daily with meals. Patient not taking: Reported on 11/02/2017 09/26/17   Shirley, [place], DO   No results found.  Positive ROS: All other systems have been reviewed and were otherwise negative with the exception of those mentioned in the HPI and as above.  Physical Exam: General: Alert, no acute distress Cardiovascular: No pedal edema Respiratory: No cyanosis, no use of accessory musculature GI: No organomegaly, abdomen is soft and non-tender Skin: No lesions in the area of chief complaint Neurologic: Sensation intact distally Psychiatric: Patient is competent for consent with normal mood and affect Lymphatic: No axillary or cervical lymphadenopathy  MUSCULOSKELETAL:  Right lower extremity: Moderate knee joint effusion noted.  No warmth and no erythema noted.  He does have no pain with gentle passive range of motion but does have increased knee pain with full extension and flexion beyond about 50 degrees.  Nontender along the calf and subcutaneous border of the tibia.  At the foot he has history of previous transmetatarsal amputation and a faint pulse noted in the dorsalis pedis with decreased sensation noted globally throughout the foot.  He is able to dorsiflex and plantarflex however.  Assessment: Right knee arthritis with large effusion Concern for septic arthrosis of native  right knee.  Plan: -I reviewed the outpatient course with my partner Dr. Lyla Glassing prior to today's inpatient consultation with the patient.  He does have a well-documented history of recurrent knee effusions with large volume fluid aspirations in the 110 cc and plus range.  This effusion  seems a little different as he has more calf pain and the quality of the fluid is more turbid this time where normally it is clear.  His cell counts are in the range of possible septic arthrosis but also inflammatory arthrosis.  This is a subacute presentation at best as he had a couple of weeks of increasing pain and swelling so I think it best served him to monitor for completion of cultures to see if he does have any septic arthritis.  In preparation for potential surgery would continue to hold Coumadin to ensure that he is in a safe range for surgery if needed.  I will follow along to the completion of cultures.  If the cultures are positive he will need a irrigation and debridement of the right knee. -At this time, he is okay to weight-bear as tolerated on that knee/leg and may have a diet up until the time that the cultures are positive.    Nicholes Stairs, MD Cell [phone]    11/03/2017 1:44 PM

## 2017-11-03 NOTE — Progress Notes (Signed)
ED TO INPATIENT HANDOFF REPORT  Name/Age/Gender Adam [name]. 57 y.o. male  Code Status    Code Status Orders  (From admission, onward)        Start     Ordered   11/02/17 2254  Full code  Continuous     11/02/17 2254    Code Status History    Date Active Date Inactive Code Status Order ID Comments User Context   09/24/2017 0303 09/26/2017 2228 Full Code [id number]  Sherene Sires, DO ED   08/17/2017 1505 08/28/2017 1836 Full Code [id number]  Janece Canterbury, MD ED   08/05/2016 1628 08/09/2016 1624 Full Code [id number]  Melvenia Needles, NP Inpatient   12/24/2014 1706 12/25/2014 1859 Full Code [id number]  Elam Dutch, MD ED      Home/SNF/Other Home  Chief Complaint Right Leg Pain  Level of Care/Admitting Diagnosis ED Disposition    ED Disposition Condition [facility] Area: [place] [100100]  Level of Care: Med-Surg [16]  Diagnosis: Septic arthritis of knee, right [facility]) [[id number]]  Admitting Physician: Doreatha Massed  Attending Physician: Etta Quill [phone]  Estimated length of stay: past midnight tomorrow  Certification:: I certify this patient will need inpatient services for at least 2 midnights  PT Class (Do Not Modify): Inpatient [101]  PT Acc Code (Do Not Modify): Private [1]       Medical History Past Medical History:  Diagnosis Date  . AICD (automatic cardioverter/defibrillator) present    Medtronic   . Aortic stenosis    moderate AS by 08/2016 echo  . Arthritis    "knees" (08/05/2016)  . Atrial fibrillation ([place])   . ESRD (end stage renal disease) on dialysis ([place])    "M, T, W, T, F; NX stage hemodialysis; I do it at home" (08/05/2016)  . 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 History of blood transfusion 2009   "S/P biopsy for prostate cancer check"  . Hypertension   . Hypothyroidism (acquired)   . Mitral stenosis    moderate mitral stenosis  .  Nonischemic cardiomyopathy ([place])   . Pneumonia 2014; 08/05/2016  . PONV (postoperative nausea and vomiting)   . Pulmonary HTN ([place])   . Renal insufficiency   . Thyroid cancer ([place]) 2011    Allergies Allergies  Allergen Reactions  . Enalaprilat Swelling and Other (See Comments)    Mouth swelling  . Vasotec [Enalapril] Swelling and Other (See Comments)    MOUTH SWELLING  . Iodinated Diagnostic Agents Nausea And Vomiting and Nausea Only    IV Location/Drains/Wounds Patient Lines/Drains/Airways Status   Active Line/Drains/Airways    Name:   Placement date:   Placement time:   Site:   Days:   Peripheral IV 11/03/17 Left;Anterior Forearm   11/03/17    0030    Forearm   less than 1   Fistula / Graft Right Upper arm Arteriovenous fistula   10/13/17    0755    Upper arm   21   Hemodialysis Catheter Left Internal jugular Double-lumen   09/01/17    1818    Internal jugular   63   Incision (Closed) 10/13/17 Arm Right   10/13/17    0755     21          Labs/Imaging Results for orders placed or performed during the hospital encounter of 11/02/17 (from the past 48 hour(s))  Protime-INR  Status: Abnormal   Collection Time: 11/02/17  6:33 PM  Result Value Ref Range   Prothrombin Time 22.9 (H) 11.4 - 15.2 seconds   INR 2.05     Comment: Performed at [facility], [place] [address], [place], [place] [address]  Body fluid culture     Status: None (Preliminary result)   Collection Time: 11/02/17  8:58 PM  Result Value Ref Range   Specimen Description KNEE RIGHT    Special Requests Normal    Gram Stain      MODERATE WBC PRESENT, PREDOMINANTLY PMN NO ORGANISMS SEEN Gram Stain Report Called to,Read Back By and Verified With: JOHNSON,L RN AT 0350 11/02/17 BY TIBBITTS,K Performed at [facility], [place] [address], [place], [place] [address]    Culture PENDING    Report Status PENDING   Synovial cell count + diff, w/ crystals     Status: Abnormal    Collection Time: 11/02/17  8:58 PM  Result Value Ref Range   Color, Synovial YELLOW YELLOW   Appearance-Synovial TURBID (A) CLEAR   Crystals, Fluid NO CRYSTALS SEEN    WBC, Synovial 45,500 (H) 0 - 200 /cu mm   Neutrophil, Synovial 90 (H) 0 - 25 %   Lymphocytes-Synovial Fld 0 0 - 20 %   Monocyte-Macrophage-Synovial Fluid 10 (L) 50 - 90 %   Eosinophils-Synovial 0 0 - 1 %    Comment: Performed at [facility], [place] [address] [place], [place] [address]   No results found.  Pending Labs Unresulted Labs (From admission, onward)   Start     Ordered   11/02/17 2300  Culture, blood (routine x 2)  BLOOD CULTURE X 2,   R     11/02/17 2300   11/02/17 7169  Basic metabolic panel  STAT,   R     11/02/17 2257   11/02/17 2258  CBC with Differential/Platelet  STAT,   R     11/02/17 2257   11/02/17 2055  Uric Acid, Body Fluid  (Arthrocentesis Panel)  STAT,   STAT     11/02/17 2054   11/02/17 2054  Glucose, Body Fluid Other  (Arthrocentesis Panel)  STAT,   STAT     11/02/17 2054   11/02/17 2054  Protein, body fluid (other)  (Arthrocentesis Panel)  STAT,   STAT     11/02/17 2054      Vitals/Pain Today's Vitals   11/02/17 1748 11/02/17 1831 11/02/17 2314 11/03/17 0000  BP:  (!) 96/93 103/72 99/72  Pulse:  78 79 79  Resp:  20 16 17   Temp: 98.3 F (36.8 C)     TempSrc: Oral     SpO2:  100% 98% 100%  Weight:  274 lb (124.3 kg)    Height:  6\' 1"  (1.854 m)    PainSc:  9       Isolation Precautions No active isolations  Medications Medications  lidocaine (XYLOCAINE) 2 % (with pres) injection (has no administration in time range)  vancomycin (VANCOCIN) IVPB 1000 mg/200 mL premix (1,000 mg Intravenous New Bag/Given 11/03/17 0040)  acetaminophen (TYLENOL) tablet 650 mg (has no administration in time range)    Or  acetaminophen (TYLENOL) suppository 650 mg (has no administration in time range)  ondansetron (ZOFRAN) tablet 4 mg (has no administration in time range)    Or   ondansetron (ZOFRAN) injection 4 mg (has no administration in time range)  ambrisentan (LETAIRIS) tablet 10 mg (has no administration in time  range)  amiodarone (PACERONE) tablet 200 mg (has no administration in time range)  atorvastatin (LIPITOR) tablet 40 mg (has no administration in time range)  cinacalcet (SENSIPAR) tablet 90 mg (has no administration in time range)  levothyroxine (SYNTHROID, LEVOTHROID) tablet 300 mcg (has no administration in time range)  midodrine (PROAMATINE) tablet 10 mg (has no administration in time range)  multivitamin (RENA-VIT) tablet 1 tablet (has no administration in time range)  tadalafil (ADCIRCA/CIALIS) tablet 20 mg (has no administration in time range)  sucralfate (CARAFATE) tablet 1 g (has no administration in time range)  pantoprazole (PROTONIX) EC tablet 40 mg (has no administration in time range)    Mobility walks

## 2017-11-03 NOTE — Progress Notes (Signed)
Notified Dr. Bridgett Larsson that we were unable to place IV in right forearm.   Farley Ly RN

## 2017-11-03 NOTE — Progress Notes (Addendum)
Family Medicine Teaching Service Daily Progress Note Intern Pager: [phone]  Patient name: Adam [name]. Medical record number: [id number] Date of birth: 28-May-1960 Age: 57 y.o. Gender: male  Primary Care Provider: [name], MD Consultants: orthopedic surgery, nephrology Code Status: full  Pt Overview and Major Events to Date:  Admitted to [facility] at [facility], transferred to [facility] for dialysis and treatment 6/24  Assessment and Plan: Adam [name]. is a 57 y.o. male presenting with right leg pain, redness, and swelling.  His medical history includes ESRD on dialysis MWF, last dialysis on 6/24, atrial fibrillation on coumadin.  Moderate aortic stenosis, PAH, nonischemic cardiomyopathy with removal of ICD in April this year following MSSA bacteremia due to AV fistula infection.  R knee pain and swelling: Concerning for septic arthritis due to his recent MSSA bacteremia and fluid studies from knee aspiration significant for 45,000 WBC, 90% neutrophils, and no crystals; however, cellulitis and gout are also possibilities since WBC is slightly below threshold of 50,000 for septic arthritis, though less likely given lack of crystals with left shift in synovial fluid.  Venous duplex of R leg was negative.  Dr. Alvan Dame (orthopedic surgeon) was consulted in the [facility] ED and plans to see patient on 6/25.  He was started on Vancomycin, and blood and fluid cultures were collected. - orthopedics consult, appreciate recommendations - holding Coumadin, patient is NPO for possible open drainage on 6/25 - follow up blood and fluid cultures; if positive, would consider ID consult - follow up urica acid level - monitor clinical status of right knee and leg - continue Vancomycin - IV Fentanyl 12.5 mg Q2H PRN for pain - due to insignificant serum WBC count, no fever, and frequent steroid knee injections, will obtain am cortisol level  Painful subcutaneous masses: Three painful,  swollen areas on L hip, axilla, and thoracolumbar area.  Have grown from quarter size to grapefruit size since April and are tender to palpation.  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 - obtain US to better characterize these masses  ESRD: MWF, received full session on 6/24.  Takes midodrine 10 mg TID AC for hypotension and sensipar 90 mg daily at 1700.  Phoslo is on his med list, but he reports he does not take this. - consult nephrology 6/25 am - continue home midodrine and sensipar after cleared for PO  Recent MSSA bacteremia: occurred in April 2019, cultured from AV fistula wound and blood.  AV fistula was ligated, and he is currently getting dialysis from a perm cath, although RUE fistula is maturing.  ICD was removed at that time given bacteremia.  A TEE was performed but results are not in system. - f/u blood cultures - obtain results of TEE  Anemia of chronic disease: Hemoglobin 9.6 on 6/25, appears to be around his baseline of ~9.  MCV in normal range.  Platelets are low at 115 but also seem to be at baseline.  Atrial fibrillation: Had regular rhythm on 6/24, takes amiodarone 200 mg daily and coumadin 6 mg on Sunday, 9 mg other days.  INR therapeutic at 2.05 on 6/24. - continue amiodarone - hold coumadin unless orthopedic recs indicate otherwise - transfer to telemetry  PAH: Echo on 08/18/17 showed an PA peak pressure of 59 mmHg. Takes ambrisentan 10 mg daily and tadalafil 20 mg daily. - continue home medications when cleared for PO  HFpEF with aortic stenosis: Echo with EF of 60-65%, dilated IVC  with elevated central venous pressure, moderate aortic stenosis with mean gradient of 25 mmHg. - administer fluids cautiously if needed   HLD: takes atorvastatin 40 mg daily - continue home atorvastatin when cleared for PO  GERD: takes protonix 40 mg daily and sucralfate 1 g BID at home - continue home meds when cleared for  PO  Hypothyroidism: Iatrogenic d/t thyroidectomy for treatment of thyroid cancer.  Takes synthroid 300 mcg daily.  Last TSH was 3.73 in Sept 2018. - continue synthroid when cleared for PO - repeat TSH level  FEN/GI: NPO pending ortho recs PPx: SCDs  Disposition: home  Subjective:  His leg continues to be painful, and he cannot bear weight on it.  He has three painful areas on his L side that have grown since April.  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  Objective: Temp:  [98.2 F (36.8 C)-98.3 F (36.8 C)] 98.2 F (36.8 C) (06/25 0206) Pulse Rate:  [77-81] 81 (06/25 0206) Resp:  [16-24] 22 (06/25 0206) BP: (86-103)/(52-93) 86/64 (06/25 0206) SpO2:  [89 %-100 %] 89 % (06/25 0206) Weight:  [272 lb 14.9 oz (123.8 kg)-274 lb (124.3 kg)] 272 lb 14.9 oz (123.8 kg) (06/25 0206) Physical Exam: General: obese male lying in bed, alert and orientation x 3 Cardiovascular: murmur from AV fistula, RRR Respiratory: CTAB Abdomen: soft, nontender, active bowel sounds Extremities: R knee swollen, nonerythematous, very tender to light touch  Laboratory: Recent Labs  Lab 10/29/17 1550 11/03/17 0031  WBC  --  4.9  HGB 8.1* 9.6*  HCT  --  31.4*  PLT  --  115*   Recent Labs  Lab 11/03/17 0031  NA 138  K 3.2*  CL 97*  CO2 29  BUN 12  CREATININE 5.20*  CALCIUM 8.2*  GLUCOSE 70    Imaging/Diagnostic Tests: No results found.   Kathrene Alu, MD 11/03/2017, 6:11 AM PGY-1, [place] Intern pager: [phone], text pages welcome

## 2017-11-03 NOTE — Progress Notes (Signed)
   Daily Progress Note  Pt on schedule on 27 JUN 19 for R arm venogram.  Pt still scheduled for such.  To facilitate the diagnostic procedure, I would placed peripheral IV in R forearm if possible.   Adele Barthel, MD, FACS [facility] [place] Office: [phone] Pager: [phone]  11/03/2017, 11:52 AM

## 2017-11-03 NOTE — Progress Notes (Signed)
ANTIBIOTIC CONSULT NOTE - INITIAL  Pharmacy Consult for vancomycin Indication: Septic arthritis  Allergies  Allergen Reactions  . Enalaprilat Swelling and Other (See Comments)    Mouth swelling  . Vasotec [Enalapril] Swelling and Other (See Comments)    MOUTH SWELLING  . Iodinated Diagnostic Agents Nausea And Vomiting and Nausea Only    Patient Measurements: Height: 6\' 1"  (185.4 cm) Weight: 272 lb 14.9 oz (123.8 kg) IBW/kg (Calculated) : 79.9   Vital Signs: Temp: 98.5 F (36.9 C) (06/25 0934) Temp Source: Oral (06/25 0934) BP: 75/36 (06/25 0934) Pulse Rate: 73 (06/25 0934) Intake/Output from previous day: No intake/output data recorded. Intake/Output from this shift: No intake/output data recorded.  Labs: Recent Labs    11/03/17 0031  WBC 4.9  HGB 9.6*  PLT 115*  CREATININE 5.20*   Estimated Creatinine Clearance: 22.1 mL/min (A) (by C-G formula based on SCr of 5.2 mg/dL (H)). No results for input(s): VANCOTROUGH, VANCOPEAK, VANCORANDOM, GENTTROUGH, GENTPEAK, GENTRANDOM, TOBRATROUGH, TOBRAPEAK, TOBRARND, AMIKACINPEAK, AMIKACINTROU, AMIKACIN in the last 72 hours.   Microbiology: Recent Results (from the past 720 hour(s))  Clostridium Difficile by PCR([facility])     Status: None   Collection Time: 10/30/17 12:03 PM  Result Value Ref Range Status   Toxigenic C. Difficile by PCR Negative Negative Final  Body fluid culture     Status: None (Preliminary result)   Collection Time: 11/02/17  8:58 PM  Result Value Ref Range Status   Specimen Description KNEE RIGHT  Final   Special Requests Normal  Final   Gram Stain   Final    MODERATE WBC PRESENT, PREDOMINANTLY PMN NO ORGANISMS SEEN Gram Stain Report Called to,Read Back By and Verified With: JOHNSON,L RN AT 1610 11/02/17 BY TIBBITTS,K Performed at [facility], [place] [address] [place], [place] [address]    Culture PENDING  Incomplete   Report Status PENDING  Incomplete  MRSA PCR Screening      Status: None   Collection Time: 11/03/17  6:45 AM  Result Value Ref Range Status   MRSA by PCR NEGATIVE NEGATIVE Final    Comment:        The GeneXpert MRSA Assay (FDA approved for NASAL specimens only), is one component of a comprehensive MRSA colonization surveillance program. It is not intended to diagnose MRSA infection nor to guide or monitor treatment for MRSA infections. Performed at [facility], [place] [address], [place], [place] [address]     Medical History: Past Medical History:  Diagnosis Date  . AICD (automatic cardioverter/defibrillator) present    Medtronic   . Aortic stenosis    moderate AS by 08/2016 echo  . Arthritis    "knees" (08/05/2016)  . Atrial fibrillation ([place])   . ESRD (end stage renal disease) on dialysis ([place])    "M, T, W, T, F; NX stage hemodialysis; I do it at home" (08/05/2016)  . 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 History of blood transfusion 2009   "S/P biopsy for prostate cancer check"  . Hypertension   . Hypothyroidism (acquired)   . Mitral stenosis    moderate mitral stenosis  . Nonischemic cardiomyopathy ([place])   . Pneumonia 2014; 08/05/2016  . PONV (postoperative nausea and vomiting)   . Pulmonary HTN ([place])   . Renal insufficiency   . Thyroid cancer ([place]) 2011   Assessment: 57 yo male with ESRD on HD MWF with R knee pain and swelling to start  on vancomycin for empiric treatment of septic arthritis. Patient received vancomycin 1000mg  at 0040 on 6/25. Will give additional 1000mg  IV x 1 to complete loading dose and then continue vancomycin after HD on Wednesday  Goal of Therapy:  Vancomycin pre-HD levels of 15-25 mcg/ml  Plan:  Vancomycin 1000mg  IV after HD on MWF Monitor pre-HD levels as needed Monitor clinical course and C&S as well as fever curve and CBC   Lucio Litsey A. Levada Dy, PharmD, [place] Pager: [phone]  11/03/2017,11:27 AM

## 2017-11-03 NOTE — Plan of Care (Signed)
Discussed with FP resident, Dr Shan Levans. Patient, Adam [name] is under [facility] teaching service, attending Dr Erin Hearing.     Estill Cotta M.D. [facility] 11/03/2017, [place] AM  Pager: [phone]

## 2017-11-03 NOTE — Progress Notes (Signed)
Initial Nutrition Assessment  DOCUMENTATION CODES:   Non-severe (moderate) malnutrition in context of chronic illness  INTERVENTION:   -Snacks TID between meals  -Continue Rena-Vit   NUTRITION DIAGNOSIS:   Moderate Malnutrition related to chronic illness(ESRD on HD) as evidenced by mild fat depletion, mild muscle depletion.  GOAL:   Patient will meet greater than or equal to 90% of their needs  MONITOR:   PO intake, Labs, Weight trends  REASON FOR ASSESSMENT:   Malnutrition Screening Tool    ASSESSMENT:   57 yo male admitted with R. 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 Pt with hx of ESRD on HD, aortic stenosis, HTN, thyroid cancer   Per ortho notes, pt with right knee arthritis with large effusion with plan for possible irrigation and debridement pending cultures  Pt reports he has not been eating well since his previous admission to hospital in April 2019 for MSSA bacteremia requiring L AVF and ICD to be removed, he completed 6 weeks of IV antibiotics. Pt had new AVF access placed in RUE on 10/13/17; RUE very swollen on exam today Pt has been eating "baby size meals" and only 1-2 meals per day. On dialysis days, pt only eats dinner. On other days pt can eat 2 small meals. Pt does not like supplements such as Nepro, Ensure and Pro-Stat which they have given him at outpatient HD. Pt was agreeable to snacks between meals to optimize po intake  Pt reports his EDW is trending down; reports dry weight down from 126 kg to 124.5 kg over the past 6 weeks; current wt 123.8 kg which is under new dry weight.   Pt verbalizes he takes sensipar and renal MVI at home; pt reports he no longer takes Phos-Lo and reports his phosphorus levels have been good as an outpatient. No phosphorus level since admit  Pt complains of diarrhea recently; reports he has been on IV antibiotics and since then is  experiencing 7-8 liquid stools per day  Labs: BUN wdl, Creatinine 5.20, potassium 3.2 Meds: sensipar, fentanyl, Rena-Vit  NUTRITION - FOCUSED PHYSICAL EXAM:    Most Recent Value  Orbital Region  Mild depletion  Upper Arm Region  Moderate depletion [RUE +edema]  Thoracic and Lumbar Region  No depletion  Buccal Region  Mild depletion  Temple Region  Mild depletion  Clavicle Bone Region  Mild depletion  Clavicle and Acromion Bone Region  Mild depletion  Scapular Bone Region  Moderate depletion  Dorsal Hand  Mild depletion  Patellar Region  Mild depletion  Anterior Thigh Region  Moderate depletion  Posterior Calf Region  No depletion  Edema (RD Assessment)  Mild       Diet Order:   Diet Order           Diet NPO time specified Except for: Sips with Meds  Diet effective ____        Diet renal with fluid restriction Fluid restriction: 1200 mL Fluid; Room service appropriate? Yes; Fluid consistency: Thin  Diet effective now          EDUCATION NEEDS:   Education needs have been addressed  Skin:  Skin Assessment: Reviewed RN Assessment  Last BM:  6/25  Height:   Ht Readings from Last 1 Encounters:  11/02/17 6\' 1"  (1.854 m)    Weight:   Wt Readings from Last 1 Encounters:  11/03/17 272 lb 14.9 oz (123.8 kg)    Ideal  Body Weight:  83.6 kg  BMI:  Body mass index is 36.01 kg/m.  Estimated Nutritional Needs:   Kcal:  2200-2400 kcals   Protein:  120-130 g  Fluid:  1000 mL plus UOP   [employer] MS, RD, LDN, CNSC [phone] Pager  [phone] Weekend/On-Call Pager

## 2017-11-03 NOTE — Progress Notes (Addendum)
S: Patient seen this morning on arrival to [facility] from [facility]. Full daily progress note to follow. He states he is overall doing okay. His pain is an 8/10 and he states the pain medications are helping. He also feels like his pain got a little bit better after having some fluid removed in the ED.  O: General: Resting comfortably in bed, in NAD Right Knee: Swelling and warmth present, patient unable to move knee due to severe pain  Plan: - Continue Vancomycin - Will follow cultures - Ortho to see if this morning - Will start IV Fentanyl for pain control - Will call Nephrology consult for HD - Patient has appointments scheduled today with his cardiologist (Dr. Percival Spanish) and orthopedic surgeon (Dr. Lyla Glassing). Will call their offices to cancel these. - Patient has upper extremity venography scheduled on 6/27. Will call patient's vascular surgeon (Dr[facility] Bridgett [facility]) to see if he wants this to have this done while patient is in the hospital or if he wants this rescheduled.

## 2017-11-03 NOTE — H&P (View-Only) (Signed)
   Daily Progress Note  Pt on schedule on 27 JUN 19 for R arm venogram.  Pt still scheduled for such.  To facilitate the diagnostic procedure, I would placed peripheral IV in R forearm if possible.   Adele Barthel, MD, FACS [facility] [place] Office: [phone] Pager: [phone]  11/03/2017, 11:52 AM

## 2017-11-03 NOTE — ED Notes (Signed)
Report called to [facility] and to receiving nurse.

## 2017-11-03 NOTE — Consult Note (Addendum)
[facility] Renal Consultation Note    Indication for Consultation:  Management of ESRD/hemodialysis, anemia, hypertension/volume, and secondary hyperparathyroidism. PCP:  HPI: Adam [name]. is a 57 y.o. male with ESRD, chronic hypotension (on midodrine), A-fib (on warfarin), NICM, and severe B knee osteoarthritis who was admitted with septic R knee.  Pt reports that he developed mild R leg pain and edema about 3 days ago, his calf was tense and he was worried that he may have had a DVT (despite being on warfarin). Then, pain localized more to the R knee area. Denies fever or chills. He has significant arthritis in B knees and tells me that he often needs "fluid drained off." He presented to the ED on 6/24 - there he underwent R knee aspiration. Gram stain showed heavy WBC, no organisms. He was admitted and started on Vancomycin. Full orthopedic consult pending.   Dialyzes MWF at [facility], last HD was yesterday (6/24) which he completed in entirety. He has been using [facility] for dialysis recently. S/p admit 08/2017 with MSSA bacteremia - requiring L AVF and ICD to be removed. Completed 6 weeks of IV Cefazolin. He recently had new AVF access placed in his RUE on 10/13/17.   Past Medical History:  Diagnosis Date  . AICD (automatic cardioverter/defibrillator) present    [employer]   . Aortic stenosis    moderate AS by 08/2016 echo  . Arthritis    "knees" (08/05/2016)  . Atrial fibrillation ([place])   . ESRD (end stage renal disease) on dialysis ([place])    "M, T, W, T, F; NX stage hemodialysis; I do it at home" (08/05/2016)  . Great toe amputation status ([place])    status post left hallux amputation 08/01/14  . History of blood transfusion 2009   "S/P biopsy for prostate cancer check"  . Hypertension   . Hypothyroidism (acquired)   . Mitral stenosis    moderate mitral stenosis  . Nonischemic cardiomyopathy ([place])   . Pneumonia 2014; 08/05/2016  . PONV (postoperative nausea and  vomiting)   . Pulmonary HTN ([place])   . Renal insufficiency   . Thyroid cancer ([place]) 2011   Past Surgical History:  Procedure Laterality Date  . ARTERIAL LINE INSERTION Right 08/24/2017   Procedure: ARTERIAL LINE INSERTION INTO RIGHT FEMORAL ARTERY;  Surgeon: Conrad Nelson, MD;  Location: [place];  Service: Vascular;  Laterality: Right;  . BASCILIC VEIN TRANSPOSITION Right 10/13/2017   Procedure: FIRST STAGE BRACHIAL VEIN TRANSPOSITION RIGHT ARM;  Surgeon: Conrad [place], MD;  Location: [place];  Service: Vascular;  Laterality: Right;  . BIOPSY  09/25/2017   Procedure: BIOPSY;  Surgeon: Ronnette Juniper, MD;  Location: [facility] ENDOSCOPY;  Service: Gastroenterology;;  . CARDIAC CATHETERIZATION Right 2017  . CARDIAC DEFIBRILLATOR PLACEMENT  2009  . DIALYSIS FISTULA CREATION Left 2003   "upper arm"  . DIALYSIS/PERMA CATHETER REMOVAL Left 09/01/2017   Procedure: DIALYSIS/PERMA CATHETER REMOVAL;  Surgeon: Conrad Wyola, MD;  Location: [place];  Service: Vascular;  Laterality: Left;  . ESOPHAGOGASTRODUODENOSCOPY (EGD) WITH PROPOFOL N/A 09/25/2017   Procedure: ESOPHAGOGASTRODUODENOSCOPY (EGD) WITH PROPOFOL;  Surgeon: Ronnette Juniper, MD;  Location: [place];  Service: Gastroenterology;  Laterality: N/A;  . HERNIA REPAIR    . ICD LEAD REMOVAL Right 08/20/2017   Procedure: ICD EXTRACTION WITH C-ARM;  Surgeon: Evans Lance, MD;  Location: [facility] OR;  Service: Cardiovascular;  Laterality: Right;  DR VAN TRIGT TO BACK-UP  . icd removed    .  INSERTION OF DIALYSIS CATHETER N/A 11/11/2016   Procedure: INSERTION OF TUNNELED DIALYSIS CATHETER;  Surgeon: Angelia Mould, MD;  Location: [place];  Service: Vascular;  Laterality: N/A;  . INSERTION OF DIALYSIS CATHETER Left 08/24/2017   Procedure: INSERTION OF DIALYSIS CATHETER;  Surgeon: Conrad [place], MD;  Location: [place];  Service: Vascular;  Laterality: Left;  . INSERTION OF DIALYSIS CATHETER Left 09/01/2017   Procedure: INSERTION OF DIALYSIS CATHETER;  Surgeon: Conrad [place],  MD;  Location: [place];  Service: Vascular;  Laterality: Left;  . KIDNEY TRANSPLANT  2009  . KNEE SURGERY Bilateral 2000s   "drained fluid"  . LAPAROSCOPIC GASTRIC BANDING  2006  . PROSTATE BIOPSY  2009  . REVISON OF ARTERIOVENOUS FISTULA Left 2/37/6283   Procedure: PLICATION OF ARTERIOVENOUS FISTULA ANEURYSM;  Surgeon: Elam Dutch, MD;  Location: [place];  Service: Vascular;  Laterality: Left;  . REVISON OF ARTERIOVENOUS FISTULA Left 11/10/2016   Procedure: REVISON OF ARTERIOVENOUS FISTULA - LEFT UPPER ARM;  Surgeon: Elam Dutch, MD;  Location: [place];  Service: Vascular;  Laterality: Left;  . REVISON OF ARTERIOVENOUS FISTULA Left 08/24/2017   Procedure: REVISON OF ARTERIOVENOUS FISTULA LEFT;  Surgeon: Conrad Guayanilla, MD;  Location: [place];  Service: Vascular;  Laterality: Left;  . TEE WITHOUT CARDIOVERSION N/A 08/20/2017   Procedure: TRANSESOPHAGEAL ECHOCARDIOGRAM (TEE);  Surgeon: Evans Lance, MD;  Location: [place];  Service: Cardiovascular;  Laterality: N/A;  . THROMBECTOMY W/ EMBOLECTOMY Left 12/25/2014   Procedure: THROMBECTOMY ARTERIOVENOUS FISTULA;  Surgeon: Elam Dutch, MD;  Location: [place];  Service: Vascular;  Laterality: Left;  . THYROIDECTOMY  2011  . TOE AMPUTATION Bilateral     right 1st and  2nd digits; left 1st and 3rd digits"  . UMBILICAL HERNIA REPAIR  X 2  . VEIN HARVEST Left 08/24/2017   Procedure: VEIN HARVEST;  Surgeon: Conrad , MD;  Location[facility] OR;  Service: Vascular;  Laterality: Left;   Family History  Problem Relation Age of Onset  . Heart failure Mother   . Hypertension Mother   . CAD Mother 60  . Emphysema Mother   . Hypertension Father   . Kidney failure Father    Social History:  reports that he has never smoked. He has never used smokeless tobacco. He reports that he drinks alcohol. He reports that he does not use drugs.  ROS:   Physical Exam: Vitals:   11/03/17 0119 11/03/17 0206 11/03/17 0627 11/03/17 0934  BP: 97/69 (!) 86/64 (!)  81/51 (!) 75/36  Pulse: 80 81 79 73  Resp: 20 (!) 22 16   Temp:  98.2 F (36.8 C) 98.7 F (37.1 C) 98.5 F (36.9 C)  TempSrc:  Oral Oral Oral  SpO2: 99% (!) 89% 95% 99%  Weight:  123.8 kg (272 lb 14.9 oz)    Height:         General: Well developed, overweight, in no acute distress. Head: Normocephalic, atraumatic, sclera non-icteric, mucus membranes are moist. Neck: Supple without lymphadenopathy/masses.  Lungs: Clear bilaterally to auscultation without wheezes, rales, or rhonchi.  Heart: RRR with normal S1, S2. No murmurs, rubs, or gallops appreciated. Abdomen: Soft, non-tender, non-distended with normoactive bowel sounds.  Musculoskeletal:  Strength and tone appear normal for age. Lower extremities: Swollen R knee, tense/woody edema in B lower legs. Neuro: Alert and oriented X 3. Moves all extremities spontaneously. Psych:  Responds to questions appropriately with a normal affect. Dialysis Access: TDC in L  chest, new R AVF (10/13/17) + bruit, + edema  Allergies  Allergen Reactions  . Enalaprilat Swelling and Other (See Comments)    Mouth swelling  . Vasotec [Enalapril] Swelling and Other (See Comments)    MOUTH SWELLING  . Iodinated Diagnostic Agents Nausea And Vomiting and Nausea Only   Prior to Admission medications   Medication Sig Start Date End Date Taking? Authorizing Provider  ambrisentan (LETAIRIS) 10 MG tablet Take 1 tablet (10 mg total) by mouth daily. 09/11/17  Yes Juanito Doom, MD  amiodarone (PACERONE) 200 MG tablet Take 200 mg by mouth every evening.    Yes [provider]  atorvastatin (LIPITOR) 40 MG tablet Take 1 tablet (40 mg total) by mouth every evening. 06/15/17  Yes Diallo, Abdoulaye, MD  cinacalcet (SENSIPAR) 30 MG tablet Take 3 tablets (90 mg total) by mouth every evening. 09/26/17  Yes Enid Derry, [place], DO  levothyroxine (SYNTHROID, LEVOTHROID) 300 MCG tablet Take 1 tablet (300 mcg total) by mouth daily before breakfast. 09/26/17  Yes Enid Derry,  [place], DO  midodrine (PROAMATINE) 10 MG tablet TAKE 1 TABLET (10 MG TOTAL) BY MOUTH 3 (THREE) TIMES DAILY. 10/08/17  Yes Minus Breeding, MD  multivitamin (RENA-VIT) TABS tablet TAKE 1 TABLET BY MOUTH EVERYDAY AT BEDTIME 10/26/17  Yes Diallo, Abdoulaye, MD  pantoprazole (PROTONIX) 40 MG tablet Take 1 tablet (40 mg total) by mouth 2 (two) times daily before a meal. 09/26/17  Yes Enid Derry, [place], DO  sucralfate (CARAFATE) 1 g tablet TAKE 1 TABLET (1 G TOTAL) BY MOUTH 2 (TWO) TIMES DAILY. 10/26/17  Yes Diallo, Abdoulaye, MD  tadalafil (ADCIRCA/CIALIS) 20 MG tablet Take 20 mg by mouth daily.  10/06/17  Yes [provider]  warfarin (COUMADIN) 6 MG tablet TAKE 1 TO 1.5 TABLETS BY MOUTH DAILY AS DIRECTED BY COUMADIN CLINIC Patient taking differently: Take 6 mg by mouth daily on Sunday. Take 9 mg by mouth daily on all other days. 10/12/17  Yes Croitoru, Mihai, MD  calcium acetate (PHOSLO) 667 MG capsule Take 2 capsules (1,334 mg total) by mouth 3 (three) times daily with meals. Patient not taking: Reported on 11/02/2017 09/26/17   Shirley, [place], DO   Current Facility-Administered Medications  Medication Dose Route Frequency Provider Last Rate Last Dose  . acetaminophen (TYLENOL) tablet 650 mg  650 mg Oral Q6H PRN Etta Quill, DO       Or  . acetaminophen (TYLENOL) suppository 650 mg  650 mg Rectal Q6H PRN Etta Quill, DO      . ambrisentan (LETAIRIS) tablet 10 mg  10 mg Oral Daily Alcario Drought, Jared M, DO      . amiodarone (PACERONE) tablet 200 mg  200 mg Oral QPM Jennette Kettle M, DO      . atorvastatin (LIPITOR) tablet 40 mg  40 mg Oral QPM Jennette Kettle M, DO      . cinacalcet [facility]) tablet 90 mg  90 mg Oral QAC supper Jennette Kettle M, DO      . fentaNYL (SUBLIMAZE) injection 12.5 mcg  12.5 mcg Intravenous Q2H PRN Kathrene Alu, MD      . levothyroxine (SYNTHROID, LEVOTHROID) tablet 300 mcg  300 mcg Oral QAC breakfast Etta Quill, DO   300 mcg at 11/03/17 6213  . midodrine  (PROAMATINE) tablet 10 mg  10 mg Oral TID WC Jennette Kettle M, DO   10 mg at 11/03/17 1009  . multivitamin (RENA-VIT) tablet 1 tablet  1 tablet Oral QHS Etta Quill, DO      .  ondansetron (ZOFRAN) tablet 4 mg  4 mg Oral Q6H PRN Etta Quill, DO       Or  . ondansetron [facility]) injection 4 mg  4 mg Intravenous Q6H PRN Etta Quill, DO      . pantoprazole (PROTONIX) EC tablet 40 mg  40 mg Oral BID AC Jennette Kettle M, DO   40 mg at 11/03/17 0175  . sucralfate (CARAFATE) tablet 1 g  1 g Oral BID Jennette Kettle M, DO      . tadalafil (ADCIRCA/CIALIS) tablet 20 mg  20 mg Oral Daily Etta Quill, DO       Labs: Basic Metabolic Panel: Recent Labs  Lab 11/03/17 0031  NA 138  K 3.2*  CL 97*  CO2 29  GLUCOSE 70  BUN 12  CREATININE 5.20*  CALCIUM 8.2*   CBC: Recent Labs  Lab 10/29/17 1550 11/03/17 0031  WBC  --  4.9  NEUTROABS  --  4.1  HGB 8.1* 9.6*  HCT  --  31.4*  MCV  --  92.9  PLT  --  115*   Dialysis Orders:  MWF at [facility] 4hr, 450/800, EDW 124.5kg, 3K/2.25Ca, AVF + TDC, no heparin (on warfarin) - Mircera 245mcg IV q 2 weeks (last 6/17) - Venofer 100mg  x 10 ordered  Assessment/Plan: 1. Septic R knee: Gram stain with >45000 WBC. Fluid Cx and BCx pending. On Vanc.  Per ortho, may need joint washout. 2.  ESRD:  Will continue HD per MWF schedule, next 6/26. 3.  Hypotension/volume: Chronic low BP, on midodrine. Below EDW today, UF as tolerated - and challenge if needed. 4.  Anemia: Hgb 9.6 - on max ESA, not due for dose yet - follow. 5.  Metabolic bone disease: Ca ok, continue home sensipar and Phoslo. 6.  A-fib (on warfarin): Per primary 7.  [place]  Veneta Penton, PA-C 11/03/2017, 11:36 AM  [facility] Pager: [phone]  Pt seen, examined and agree w A/P as above. ESRD pt recently admitted for staph sepsis w/ grossly infected AICD removed and also had infected AV fistula in LUA removed. Now is dialyzing by a TDC and had a new RUA AVF 1st stage  BVF placed 10/13/17.  He is here w/ a possible septic R knee. Plan HD tomorrow on schedule.  Kelly Splinter MD [employer] pager [phone]   11/03/2017, 12:42 PM

## 2017-11-03 NOTE — Progress Notes (Signed)
Family Medicine Progress note  Called and updated family on plan of action. Awaiting cultures from knee joint. Will be our major decision point in terms of treatment options. Will let us know if infectious vs other etiology. Daughter appreciative of the information. Requests to be updated with all information.  Guadalupe Dawn MD PGY-1 Family Medicine Resident

## 2017-11-04 DIAGNOSIS — E440 Moderate protein-calorie malnutrition: Secondary | ICD-10-CM

## 2017-11-04 LAB — RENAL FUNCTION PANEL
Albumin: 2.8 g/dL — ABNORMAL LOW (ref 3.5–5.0)
Anion gap: 13 (ref 5–15)
BUN: 19 mg/dL (ref 6–20)
CALCIUM: 8 mg/dL — AB (ref 8.9–10.3)
CO2: 25 mmol/L (ref 22–32)
CREATININE: 7.02 mg/dL — AB (ref 0.61–1.24)
Chloride: 98 mmol/L (ref 98–111)
GFR calc non Af Amer: 8 mL/min — ABNORMAL LOW (ref >60)
GFR, EST AFRICAN AMERICAN: 9 mL/min — AB (ref >60)
Glucose, Bld: 55 mg/dL — ABNORMAL LOW (ref 70–99)
Phosphorus: 3.3 mg/dL (ref 2.5–4.6)
Potassium: 3.5 mmol/L (ref 3.5–5.1)
SODIUM: 136 mmol/L (ref 135–145)

## 2017-11-04 LAB — GLUCOSE, BODY FLUID OTHER

## 2017-11-04 LAB — PROTEIN, BODY FLUID (OTHER): TOTAL PROTEIN, BODY FLUID OTHER: 5 g/dL

## 2017-11-04 LAB — CBC
HCT: 29.6 % — ABNORMAL LOW (ref 39.0–52.0)
Hemoglobin: 9 g/dL — ABNORMAL LOW (ref 13.0–17.0)
MCH: 28.1 pg (ref 26.0–34.0)
MCHC: 30.4 g/dL (ref 30.0–36.0)
MCV: 92.5 fL (ref 78.0–100.0)
PLATELETS: 95 10*3/uL — AB (ref 150–400)
RBC: 3.2 MIL/uL — AB (ref 4.22–5.81)
RDW: 18.8 % — AB (ref 11.5–15.5)
WBC: 4.5 10*3/uL (ref 4.0–10.5)

## 2017-11-04 LAB — TSH: TSH: 0.696 u[IU]/mL (ref 0.350–4.500)

## 2017-11-04 LAB — CORTISOL-AM, BLOOD: CORTISOL - AM: 11.2 ug/dL (ref 6.7–22.6)

## 2017-11-04 LAB — URIC ACID, BODY FLUID: Uric Acid Body Fluid: 2.7 mg/dL

## 2017-11-04 MED ORDER — VANCOMYCIN HCL IN DEXTROSE 1-5 GM/200ML-% IV SOLN
INTRAVENOUS | Status: AC
  Filled 2017-11-04: qty 200

## 2017-11-04 NOTE — Progress Notes (Addendum)
Family Medicine Teaching Service Daily Progress Note Intern Pager: [phone]  Patient name: Adam [name]. Medical record number: [id number] Date of birth: 11/27/1960 Age: 57 y.o. Gender: male  Primary Care Provider: [name], MD Consultants: orthopedic surgery, nephrology Code Status: full  Pt Overview and Major Events to Date:  Admitted to [facility] at [facility], transferred to [facility] for dialysis and treatment 6/24 Received dialysis on 6/26  Assessment and Plan: Khary Schaben. is a 57 y.o. male presenting with right leg pain, redness, and swelling.  His medical history includes ESRD on dialysis MWF, last dialysis on 6/24, atrial fibrillation on coumadin.  Moderate aortic stenosis, PAH, nonischemic cardiomyopathy with removal of ICD in April this year following MSSA bacteremia due to AV fistula infection.  R knee pain and swelling, improving:  Septic vs inflammatory process in joint. Orthopedic surgery will hold off on interventions until cultures return and will perform a wash out of the knee if it is positive.  Culture report with no growth x 2 days, no organisms found on gram stain.  Serum WBC normal at 4.5. - orthopedics following, appreciate recommendations - hold Coumadin until cleared by ortho - follow up blood cultures - follow up uric acid level - monitor clinical status of right knee and leg - stop Vancomycin - stop IV Fentanyl 12.5 mg Q2H PRN for pain since last dose was at 1400 on 6/25  Painful subcutaneous masses: Three painful, swollen areas on L hip, axilla, and thoracolumbar area.  Have grown from quarter size to grapefruit size since April and are tender to palpation.  [place] only positive for edema, no abscesses visualized.  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 - consult nephrology regarding this diagnsosis  ESRD: MWF.  Takes midodrine 10 mg TID AC for hypotension and sensipar 90  mg daily at 1700.   - consult nephrology 6/25 am - continue home midodrine and sensipar after cleared for PO  Recent MSSA bacteremia: occurred in April 2019, cultured from AV fistula wound and blood.  AV fistula was ligated, and he is currently getting dialysis from a perm cath, although RUE fistula is maturing.  ICD was removed at that time given bacteremia.  A TEE was performed but results are not in system. - f/u blood cultures  Anemia of chronic disease: Hemoglobin 9.0 on 6/26, appears to be around his baseline of ~9.  MCV in normal range.  Platelets are low at  but also seem to be at baseline.  Atrial fibrillation: Regular rhythm during admission, takes amiodarone 200 mg daily and coumadin 6 mg on Sunday, 9 mg other days.  INR therapeutic at 2.05 on 6/24. - continue amiodarone - hold coumadin  PAH: Echo on 08/18/17 showed an PA peak pressure of 59 mmHg. Takes ambrisentan 10 mg daily and tadalafil 20 mg daily. - continue home medications   HFpEF with aortic stenosis: Echo with EF of 60-65%, dilated IVC with elevated central venous pressure, moderate aortic stenosis with mean gradient of 25 mmHg. - administer fluids cautiously if needed   HLD: takes atorvastatin 40 mg daily - continue home atorvastatin  GERD: takes protonix 40 mg daily and sucralfate 1 g BID at home - continue home meds   Hypothyroidism: Iatrogenic d/t thyroidectomy for treatment of thyroid cancer.  Takes synthroid 300 mcg daily.  Last TSH was 3.73 in Sept 2018.  TSH 0.696 on 6/25 - continue synthroid   FEN/GI: renal diet  PPx: SCDs  Disposition: home  Subjective:  He says his knee is more mobile and less painful this morning.  Objective: Temp:  [97.3 F (36.3 C)-98.5 F (36.9 C)] 98 F (36.7 C) (06/26 0524) Pulse Rate:  [73-80] 73 (06/26 0524) Resp:  [16-18] 16 (06/26 0524) BP: (66-88)/(36-55) 79/55 (06/26 0524) SpO2:  [90 %-100 %] 100 % (06/26 0524) Physical Exam: General: obese male lying in bed,  alert and orientation x 3 Cardiovascular: murmur from AV fistula, RRR Respiratory: CTAB Abdomen: soft, nontender, active bowel sounds Extremities: R knee swollen, nonerythematous, tenderness much improved today, bark-like appearance in distal shins bilaterally  Laboratory: Recent Labs  Lab 10/29/17 1550 11/03/17 0031  WBC  --  4.9  HGB 8.1* 9.6*  HCT  --  31.4*  PLT  --  115*   Recent Labs  Lab 11/03/17 0031  NA 138  K 3.2*  CL 97*  CO2 29  BUN 12  CREATININE 5.20*  CALCIUM 8.2*  GLUCOSE 70    Imaging/Diagnostic Tests: [place] Chest Soft Tissue  Result Date: 11/03/2017 CLINICAL DATA:  Diffuse swelling along the LEFT back, flank, chest and axilla EXAM: ULTRASOUND OF HEAD/NECK SOFT TISSUES TECHNIQUE: Ultrasound examination of the LEFT flank, back, chest and axillary soft tissues was performed in the area of clinical concern. COMPARISON:  None FINDINGS: Diffuse subcutaneous edema along the LEFT flank, back, chest and axilla identified. No discrete abscess. No other abnormalities noted. IMPRESSION: Diffuse edema along the LEFT back, chest and axilla. No discrete abscess. Electronically Signed   By: Margarette Canada M.D.   On: 11/03/2017 18:50     Kathrene Alu, MD 11/04/2017, 6:53 AM PGY-1, [place] Intern pager: [phone], text pages welcome

## 2017-11-04 NOTE — Progress Notes (Addendum)
[facility] Progress Note   Subjective:  Seen on HD, 2L goal. BP low, but stable. No CP/SOB. Says R knee pain slightly improved and able to bend it slightly this morning.  Objective Vitals:   11/04/17 0830 11/04/17 0900 11/04/17 0930 11/04/17 1000  BP: (!) 79/40 (!) 85/55 (!) 94/50 99/63  Pulse: 62 64 65 70  Resp:      Temp:      TempSrc:      SpO2:      Weight:      Height:       Physical Exam General: Well appearing, NAD Heart: RRR; no murmur Lungs: CTAB Abdomen: soft, non-tender Extremities: Swollen R knee, tense/woody edema in B lower legs. Dialysis Access: [facility] in L chest, new R AVG (10/13/17) + edema/ + bruit  Additional Objective Labs: Basic Metabolic Panel: Recent Labs  Lab 11/03/17 0031 11/04/17 0505  NA 138 136  K 3.2* 3.5  CL 97* 98  CO2 29 25  GLUCOSE 70 55*  BUN 12 19  CREATININE 5.20* 7.02*  CALCIUM 8.2* 8.0*  PHOS  --  3.3   Liver Function Tests: Recent Labs  Lab 11/04/17 0505  ALBUMIN 2.8*   CBC: Recent Labs  Lab 10/29/17 1550 11/03/17 0031 11/04/17 0505  WBC  --  4.9 4.5  NEUTROABS  --  4.1  --   HGB 8.1* 9.6* 9.0*  HCT  --  31.4* 29.6*  MCV  --  92.9 92.5  PLT  --  115* 95*   Studies/Results: [place] Chest Soft Tissue  Result Date: 11/03/2017 CLINICAL DATA:  Diffuse swelling along the LEFT back, flank, chest and axilla EXAM: ULTRASOUND OF HEAD/NECK SOFT TISSUES TECHNIQUE: Ultrasound examination of the LEFT flank, back, chest and axillary soft tissues was performed in the area of clinical concern. COMPARISON:  None FINDINGS: Diffuse subcutaneous edema along the LEFT flank, back, chest and axilla identified. No discrete abscess. No other abnormalities noted. IMPRESSION: Diffuse edema along the LEFT back, chest and axilla. No discrete abscess. Electronically Signed   By: Margarette Canada M.D.   On: 11/03/2017 18:50   Medications: . vancomycin 1,000 mg (11/04/17 1008)   . ambrisentan  10 mg Oral Daily  . amiodarone  200 mg Oral  QPM  . atorvastatin  40 mg Oral QPM  . calcium acetate  1,334 mg Oral TID WC  . Chlorhexidine Gluconate Cloth  6 each Topical Q0600  . cinacalcet  90 mg Oral QAC supper  . levothyroxine  300 mcg Oral QAC breakfast  . midodrine  10 mg Oral TID WC  . multivitamin  1 tablet Oral QHS  . pantoprazole  40 mg Oral BID AC  . sucralfate  1 g Oral BID  . tadalafil  20 mg Oral Daily    Dialysis Orders: MWF at [facility] 4hr, 450/800, EDW 124.5kg, 3K/2.25Ca, AVF + TDC, no heparin (on warfarin) - Mircera 228mcg IV q 2 weeks (last 6/17) - Venofer 100mg  x 10 ordered  Assessment/Plan: 1. R knee pain /effusion: Gram stain with >45000 WBC. Fluid Cx and BCx pending. On Vanc.  Per ortho, may need joint washout if joint fluid Cx turns positive. Per ortho not sure inflamm vs infection, cx's of fluid negative at 2 days.  On vanc, doing better.  2.  ESRD:  Will continue HD per MWF schedule, next 6/26. HDtoday.  3.  Hypotension/volume: Chronic low BP, on midodrine. Below EDW today, UF as tolerated - 2L goal today. 4.  Anemia: Hgb  9.0 - on max ESA, not due for dose yet - follow. 5.  Metabolic bone disease: Ca ok, continue home sensipar and Phoslo. 6.  A-fib: Warfarin on hold in case surgery needed. Per primary 7.  Page, PA-C 11/04/2017, 10:39 AM  [facility] Pager: [phone]  Pt seen, examined, agree w assess/plan as above with additions as indicated.  Kelly Splinter MD [employer] pager [phone]    cell [phone] 11/04/2017, 11:39 AM

## 2017-11-04 NOTE — Consult Note (Signed)
   Adam [name]   11/04/2017  [name]. 10/27/1960 [id number]  Met with patient at bedside this morning regarding [name] services in the [facility] with [facility].  Patient states he has good transportation as he still drives to dialysis.  He does not have any medication issues with affording or taking them.  He states he would like the information but no current needs noted.  He endorses [name], MD as his primary care provider. This office is listed to provide the post hospital  He was provided a brochure with a 24 hour nurse advise line and contact information regarding services.  No current needs assessed.   For questions, please contact:  Natividad Brood, RN BSN [facility] Liaison  [phone] business mobile phone Toll free office [phone]

## 2017-11-05 ENCOUNTER — Encounter (HOSPITAL_COMMUNITY): Payer: Self-pay | Admitting: Vascular Surgery

## 2017-11-05 ENCOUNTER — Ambulatory Visit (HOSPITAL_COMMUNITY): Admission: RE | Admit: 2017-11-05 | Payer: Medicare HMO | Source: Ambulatory Visit | Admitting: Vascular Surgery

## 2017-11-05 ENCOUNTER — Encounter (HOSPITAL_COMMUNITY)
Admission: EM | Disposition: A | Discharge status: Home Health | Payer: Self-pay | Source: Home / Self Care | Attending: Family Medicine

## 2017-11-05 ENCOUNTER — Other Ambulatory Visit: Payer: Self-pay | Admitting: *Deleted

## 2017-11-05 DIAGNOSIS — N186 End stage renal disease: Secondary | ICD-10-CM | POA: Diagnosis not present

## 2017-11-05 DIAGNOSIS — Z992 Dependence on renal dialysis: Secondary | ICD-10-CM | POA: Diagnosis not present

## 2017-11-05 DIAGNOSIS — T82898A Other specified complication of vascular prosthetic devices, implants and grafts, initial encounter: Secondary | ICD-10-CM | POA: Diagnosis not present

## 2017-11-05 HISTORY — PX: UPPER EXTREMITY VENOGRAPHY: CATH118272

## 2017-11-05 LAB — CBC
HCT: 33.1 % — ABNORMAL LOW (ref 39.0–52.0)
HEMOGLOBIN: 10 g/dL — AB (ref 13.0–17.0)
MCH: 28 pg (ref 26.0–34.0)
MCHC: 30.2 g/dL (ref 30.0–36.0)
MCV: 92.7 fL (ref 78.0–100.0)
Platelets: 159 10*3/uL (ref 150–400)
RBC: 3.57 MIL/uL — ABNORMAL LOW (ref 4.22–5.81)
RDW: 18.6 % — ABNORMAL HIGH (ref 11.5–15.5)
WBC: 5.8 10*3/uL (ref 4.0–10.5)

## 2017-11-05 LAB — BASIC METABOLIC PANEL
ANION GAP: 10 (ref 5–15)
BUN: 13 mg/dL (ref 6–20)
CALCIUM: 7.6 mg/dL — AB (ref 8.9–10.3)
CO2: 27 mmol/L (ref 22–32)
Chloride: 99 mmol/L (ref 98–111)
Creatinine, Ser: 5.35 mg/dL — ABNORMAL HIGH (ref 0.61–1.24)
GFR calc Af Amer: 13 mL/min — ABNORMAL LOW (ref >60)
GFR, EST NON AFRICAN AMERICAN: 11 mL/min — AB (ref >60)
Glucose, Bld: 71 mg/dL (ref 70–99)
POTASSIUM: 3.5 mmol/L (ref 3.5–5.1)
Sodium: 136 mmol/L (ref 135–145)

## 2017-11-05 LAB — HEPATITIS B SURFACE ANTIGEN: HEP B S AG: NEGATIVE

## 2017-11-05 LAB — PARATHYROID HORMONE, INTACT (NO CA): PTH: 124 pg/mL — AB (ref 15–65)

## 2017-11-05 LAB — SEDIMENTATION RATE: Sed Rate: 53 mm/hr — ABNORMAL HIGH (ref 0–16)

## 2017-11-05 LAB — C-REACTIVE PROTEIN: CRP: 11.4 mg/dL — AB (ref <1.0)

## 2017-11-05 LAB — PROTIME-INR
INR: 1.85
Prothrombin Time: 21.2 seconds — ABNORMAL HIGH (ref 11.4–15.2)

## 2017-11-05 SURGERY — UPPER EXTREMITY VENOGRAPHY
Anesthesia: LOCAL | Laterality: Right

## 2017-11-05 MED ORDER — TRIAMCINOLONE ACETONIDE 40 MG/ML IJ SUSP
40.0000 mg | INTRAMUSCULAR | Status: DC
  Filled 2017-11-05: qty 1

## 2017-11-05 MED ORDER — ALTEPLASE 2 MG IJ SOLR: 2.0000 mg | Freq: Once | INTRAMUSCULAR | Status: DC | PRN

## 2017-11-05 MED ORDER — PENTAFLUOROPROP-TETRAFLUOROETH EX AERO: 1.0000 "application " | INHALATION_SPRAY | CUTANEOUS | Status: DC | PRN

## 2017-11-05 MED ORDER — CHLORHEXIDINE GLUCONATE 4 % EX LIQD
60.0000 mL | Freq: Once | CUTANEOUS | Status: AC
  Administered 2017-11-06: 4 via TOPICAL
  Filled 2017-11-05 (×3): qty 60

## 2017-11-05 MED ORDER — LIDOCAINE HCL (PF) 1 % IJ SOLN: 5.0000 mL | INTRAMUSCULAR | Status: DC | PRN

## 2017-11-05 MED ORDER — DEXTROSE 5 % IV SOLN
3.0000 g | INTRAVENOUS | Status: AC
  Administered 2017-11-06: 3 g via INTRAVENOUS
  Filled 2017-11-05: qty 3

## 2017-11-05 MED ORDER — SODIUM CHLORIDE 0.9 % IV SOLN: 100.0000 mL | INTRAVENOUS | Status: DC | PRN

## 2017-11-05 MED ORDER — LIDOCAINE HCL (PF) 1 % IJ SOLN: 2.0000 mL | INTRAMUSCULAR | Status: DC

## 2017-11-05 MED ORDER — HEPARIN SODIUM (PORCINE) 1000 UNIT/ML DIALYSIS: 1000.0000 [IU] | INTRAMUSCULAR | Status: DC | PRN

## 2017-11-05 MED ORDER — BUTAMBEN-TETRACAINE-BENZOCAINE 2-2-14 % EX AERO
1.0000 | INHALATION_SPRAY | CUTANEOUS | Status: DC
  Filled 2017-11-05: qty 20

## 2017-11-05 MED ORDER — DIPHENHYDRAMINE HCL 50 MG/ML IJ SOLN
INTRAMUSCULAR | Status: DC | PRN
  Administered 2017-11-05: 25 mg via INTRAVENOUS

## 2017-11-05 MED ORDER — IODIXANOL 320 MG/ML IV SOLN
INTRAVENOUS | Status: DC | PRN
  Administered 2017-11-05: 50 mL via INTRAVENOUS

## 2017-11-05 MED ORDER — LIDOCAINE HCL (PF) 1 % IJ SOLN
INTRAMUSCULAR | Status: DC | PRN
  Administered 2017-11-05: 2 mL

## 2017-11-05 MED ORDER — POVIDONE-IODINE 10 % EX SWAB: 2.0000 "application " | Freq: Once | CUTANEOUS | Status: DC

## 2017-11-05 MED ORDER — METHYLPREDNISOLONE SODIUM SUCC 125 MG IJ SOLR
INTRAMUSCULAR | Status: DC | PRN
  Administered 2017-11-05: 125 mg via INTRAVENOUS

## 2017-11-05 MED ORDER — LIDOCAINE-PRILOCAINE 2.5-2.5 % EX CREA: 1.0000 "application " | TOPICAL_CREAM | CUTANEOUS | Status: DC | PRN

## 2017-11-05 MED ORDER — FAMOTIDINE IN NACL 20-0.9 MG/50ML-% IV SOLN
INTRAVENOUS | Status: AC | PRN
  Administered 2017-11-05: 20 mg via INTRAVENOUS

## 2017-11-05 SURGICAL SUPPLY — 7 items
COVER DOME SNAP 22 D (MISCELLANEOUS) ×2 IMPLANT
COVER PRB 48X5XTLSCP FOLD TPE (BAG) ×1 IMPLANT
COVER PROBE 5X48 (BAG) ×1
KIT MICROPUNCTURE NIT STIFF (SHEATH) ×2 IMPLANT
STOPCOCK MORSE 400PSI 3WAY (MISCELLANEOUS) ×2 IMPLANT
TRAY PV CATH (CUSTOM PROCEDURE TRAY) ×2 IMPLANT
TUBING CIL FLEX 10 FLL-RA (TUBING) ×2 IMPLANT

## 2017-11-05 NOTE — Op Note (Addendum)
    OPERATIVE NOTE   PROCEDURE: 1. Cannulation of right brachial vein transposition under Sonosite guidance 2. Right arm fistulogram 3. Left central venogram   PRE-OPERATIVE DIAGNOSIS: end stage renal disease  POST-OPERATIVE DIAGNOSIS: same as above   SURGEON: Adele Barthel, MD  ANESTHESIA: local  ESTIMATED BLOOD LOSS: 5 cc  FINDING(S): 1. Occluded bilateral central venous systems: right central venous system drains via hypertrophied neck veins 2. Occluded Left subclavian vein and axillary vein stents 3. Left internal jugular vein tunneled dialysis catheter in place  SPECIMEN(S):  None  CONTRAST: 20 cc  INDICATIONS: Adam [name]. is a 57 y.o. male who presents with end stage renal disease.  The patient is scheduled for right arm and central venogram to help determine the patency of the central venous structures due to right arm swelling.  The patient is aware the risks include but are not limited to: bleeding, infection, thrombosis of the cannulated access, and possible anaphylactic reaction to the contrast.  The patient is aware of the risks of the procedure and elects to proceed forward.   DESCRIPTION: After full informed written consent was obtained, the patient was brought back to the angiography suite and placed supine upon the angiography table.  The patient was connected to monitoring equipment.  Under Sonosite guidance, I cannulated the brachial vein superficial to the brachial artery.  This was presumed to be the first stage brachial vein transposition.  The needle was exchanged for the microsheath.  The microsheath was connected to IV extension tubing.  Hand injections were completed to image the right central venous structures, the findings of which are listed above.    Based on this picture, the right brachial vein transposition needs to be ligated.  The patient agreed to proceed with left arm central venogram on the table to see if there were any further access  options in his arms.  The left forearm IV was connected to IV extension tubing.  Hand injections were completed to image the arm veins and central venous structures, the findings of which are listed above.    Based on the images, this patient is likely a candidate for a HeRO graft-catheter.  Unfortunately, the patient has a septic knee currently so he is NOT a candidate for such until the knee infection has cleared.   COMPLICATIONS: none  CONDITION: stable   Adele Barthel, MD [facility] Office: [phone] Pager: [phone]  11/05/2017 8:45 AM

## 2017-11-05 NOTE — Progress Notes (Addendum)
[facility] Progress Note   Subjective:  Seen in room, just getting back from RUE venogram and told that central veins were occluded, may need HeRO AVG in future. R knee pain stable. No CP/dyspnea. Hospitalist in the room today asking about painful subcutaneous nodules - new and enlarging over past few weeks.  Objective Vitals:   11/05/17 0841 11/05/17 0846 11/05/17 0851 11/05/17 0923  BP: (!) 89/56   (!) 86/64  Pulse: 76 (!) 0 (!) 0 74  Resp: 14 (!) 43 (!) 0 16  Temp:    98 F (36.7 C)  TempSrc:    Oral  SpO2: 100% (!) 0% (!) 0% 95%  Weight:      Height:       Physical Exam General: Well appearing, NAD Skin: 3 ill defined tender nodules without subcutaneous fat (2 in L flank, 1 R chest area) Heart: RRR; no murmur Lungs: CTAB Abdomen: soft, non-tender Extremities: Swollen R knee, tense/woody edema in B lower legs. Dialysis Access: TDC in L chest, new R AVG (10/13/17) + edema/ + bruit  Additional Objective Labs: Basic Metabolic Panel: Recent Labs  Lab 11/03/17 0031 11/04/17 0505 11/05/17 0518  NA 138 136 136  K 3.2* 3.5 3.5  CL 97* 98 99  CO2 29 25 27   GLUCOSE 70 55* 71  BUN 12 19 13   CREATININE 5.20* 7.02* 5.35*  CALCIUM 8.2* 8.0* 7.6*  PHOS  --  3.3  --    Liver Function Tests: Recent Labs  Lab 11/04/17 0505  ALBUMIN 2.8*   CBC: Recent Labs  Lab 10/29/17 1550 11/03/17 0031 11/04/17 0505  WBC  --  4.9 4.5  NEUTROABS  --  4.1  --   HGB 8.1* 9.6* 9.0*  HCT  --  31.4* 29.6*  MCV  --  92.9 92.5  PLT  --  115* 95*   Blood Culture    Component Value Date/Time   SDES  11/03/2017 0032    BLOOD LEFT FOREARM Performed at [facility], [place] [address], [place], [place] [address]    SPECREQUEST  11/03/2017 0032    BOTTLES DRAWN AEROBIC AND ANAEROBIC Blood Culture adequate volume Performed at [facility] [address], [place], [place] [address]    CULT  11/03/2017 0032    NO GROWTH 2 DAYS Performed  at [facility] [address], [place], [place] [address]    REPTSTATUS PENDING 11/03/2017 0032   Studies/Results: [place] Chest Soft Tissue  Result Date: 11/03/2017 CLINICAL DATA:  Diffuse swelling along the LEFT back, flank, chest and axilla EXAM: ULTRASOUND OF HEAD/NECK SOFT TISSUES TECHNIQUE: Ultrasound examination of the LEFT flank, back, chest and axillary soft tissues was performed in the area of clinical concern. COMPARISON:  None FINDINGS: Diffuse subcutaneous edema along the LEFT flank, back, chest and axilla identified. No discrete abscess. No other abnormalities noted. IMPRESSION: Diffuse edema along the LEFT back, chest and axilla. No discrete abscess. Electronically Signed   By: Margarette Canada M.D.   On: 11/03/2017 18:50   Medications:  . ambrisentan  10 mg Oral Daily  . amiodarone  200 mg Oral QPM  . atorvastatin  40 mg Oral QPM  . calcium acetate  1,334 mg Oral TID WC  . Chlorhexidine Gluconate Cloth  6 each Topical Q0600  . cinacalcet  90 mg Oral QAC supper  . levothyroxine  300 mcg Oral QAC breakfast  . midodrine  10 mg Oral TID WC  . multivitamin  1 tablet Oral QHS  . pantoprazole  40 mg Oral BID AC  . sucralfate  1 g Oral BID  . tadalafil  20 mg Oral Daily    Dialysis Orders: MWF at [facility] 4[facility], 450/800, EDW 124.5kg, 3K/2.25Ca, AVF + TDC, no heparin (on warfarin) - Mircera 276mcg IV q 2 weeks (last 6/17) - Venofer 100mg  x 10 ordered  Assessment/Plan: 1. R knee pain /effusion: Gram stain with >45000 WBC. On Vanc. Per ortho, likely more inflammatory versus true infection. Knee Cx remain negative at 3 days. 2. ESRD:Will continue HD per MWF schedule, next 6/28.  3. Hypotension/volume:Chronic low BP, on midodrine. Under EDW now - will change on d/c. 4. Anemia:Hgb 9.0 - on max ESA, not due for dose yet - follow. 5. Metabolic bone disease:Corr Ca/Phos ok, continue home sensipar and Phoslo. 6. A-fib:Warfarin on hold in case surgery needed. Per  primary 7. PAH 8.  AVF dysfunction: + RUE edema, central veins stenosed/occluded. Possible HeRO per VVS after knee issues resolve. 9. Subcutanous nodules: Unclear etiology, ?worrisome for possible early calciphylaxis/fat necrosis although looks like Ca/Phos/PTH are controlled. Does take warfarin. Rec deep biopsy of the areas, ?could ask gen surgery to do this. If + for calcifications, will need to change warfarin to Eliquis.  Adam Penton, PA-C 11/05/2017, 11:43 AM  [facility] Pager: [phone]  Pt seen, examined and agree w A/P as above.  Kelly Splinter MD [employer] pager [phone]   11/05/2017, 2:02 PM

## 2017-11-05 NOTE — Progress Notes (Addendum)
   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  Drainage is via hypertrophied neck veins.   No way to salvage this fistula so patient should have it ligated.  Pt is on coumadin, as will have to wait until Monday at the earliest.  Left central venogram was also completed: occluded left subclavian vein with non-visualization of innominate vein.  There is a LIJV TDC in place, so theoritically a HeRO graft-catheter might be possible.  Unfortunately, patient was admitted with a septic knee, so placement of an arteriovenous graft is not recommended at this time.   Once septic knee is resolved, might consider Left HeRO graft-catheter.  Adele Barthel, MD, FACS [facility] Office: [phone] Pager: [phone]  11/05/2017, 8:55 AM   Addendum  Per my conversation with resident, pt's knee and blood cultures have been negative.  The patient is going to be discharged today.  Will arrange for the coming week: ligation of right brachial vein transposition, Left HeRO graft-catheter placement.   Continue to hold Coumadin to allow reversal of anticoagulation   Adele Barthel, MD, FACS [facility] Office: [phone] Pager: [phone]  11/05/2017, 9:13 AM

## 2017-11-05 NOTE — Progress Notes (Addendum)
Family Medicine Teaching Service Daily Progress Note Intern Pager: [phone]  Patient name: Adam [name]. Medical record number: [id number] Date of birth: [birth year] Age: 57 y.o. Gender: male  Primary Care Provider: [name], MD Consultants: orthopedic surgery, nephrology Code Status: full  Pt Overview and Major Events to Date:  Admitted to [facility] at [facility], transferred to [facility] for dialysis and treatment 6/24 Received dialysis on 6/26 Approved for discharge from ortho perspective 6/27  Assessment and Plan: Adam [name]. is a 57 y.o. male presenting with right leg pain, redness, and swelling.  His medical history includes ESRD on dialysis MWF, last dialysis on 6/24, atrial fibrillation on coumadin.  Moderate aortic stenosis, PAH, nonischemic cardiomyopathy with removal of ICD in April this year following MSSA bacteremia due to AV fistula infection.  R knee pain and swelling, improving:  Septic vs inflammatory process in joint, likely inflammatory now given negative gram stain and culture.  Culture report with no growth x 2 days, no organisms found on gram stain.  Uric acid level is normal. - Ortho recs: okay to discharge on 6/27 since fluid cultures are negative  - follow up blood cultures - NG x 2 days - monitor clinical status of right knee and leg - aspirate leg per patient request on 6/27  Painful subcutaneous masses: Three painful, swollen areas on L hip, axilla, and thoracolumbar area.  Have grown from quarter size to grapefruit size since April and are tender to palpation.  [place] only positive for edema, no abscesses visualized.  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 - surgery consult outpatient, will need biopsy of the subcutaneous tissue - stop warfarin, start Eliquis after patient's outpatient vascular surgery procedure  ESRD: MWF.   Takes midodrine 10 mg TID AC for hypotension and sensipar 90 mg daily at 1700.  Dr. Bridgett Larsson performed a RUE venography and determined that his right AV fistula will not be usable.  They will try to get more access on left side.   - follow up in one week outpatient and hold warfarin until then per Dr. Lianne Moris recommendations - continue home midodrine and sensipar  Recent MSSA bacteremia: occurred in April 2019, cultured from AV fistula wound and blood.  AV fistula was ligated, and he is currently getting dialysis from a perm cath, although RUE fistula is maturing.  ICD was removed at that time given bacteremia.  A TEE was performed but results are not in system. - f/u blood cultures - NG x 2 days  Anemia of chronic disease: Hemoglobin 9.0 on 6/26, appears to be around his baseline of ~9.  MCV in normal range.  Platelets are low at  but also seem to be at baseline.  Atrial fibrillation: Regular rhythm during admission, takes amiodarone 200 mg daily and coumadin 6 mg on Sunday, 9 mg other days.  INR therapeutic at 2.05 on 6/24. - continue amiodarone - restart coumadin  PAH: Echo on 08/18/17 showed an PA peak pressure of 59 mmHg. Takes ambrisentan 10 mg daily and tadalafil 20 mg daily. - continue home medications   HFpEF with aortic stenosis: Echo with EF of 60-65%, dilated IVC with elevated central venous pressure, moderate aortic stenosis with mean gradient of 25 mmHg. - administer fluids cautiously if needed   HLD: takes atorvastatin 40 mg daily - continue home atorvastatin  GERD: takes protonix 40 mg daily and sucralfate 1 g  BID at home - continue home meds   Hypothyroidism: Iatrogenic d/t thyroidectomy for treatment of thyroid cancer.  Takes synthroid 300 mcg daily.  Last TSH was 3.73 in Sept 2018.  TSH 0.696 on 6/25 - continue synthroid   FEN/GI: renal diet PPx: SCDs  Disposition: home hopefully 6/27  Subjective:  He says his knee is improved, but he still cannot put his full weight on  it.  He would like for it to be aspirated before he leaves the hospital if possible today.  Objective: Temp:  [97.5 F (36.4 C)-98.7 F (37.1 C)] 98.7 F (37.1 C) (06/27 0503) Pulse Rate:  [58-81] 72 (06/27 0503) Resp:  [18-19] 19 (06/27 0503) BP: (75-99)/(40-69) 91/66 (06/27 0503) SpO2:  [96 %-100 %] 96 % (06/27 0503) Weight:  [270 lb 8.1 oz (122.7 kg)-270 lb 8.2 oz (122.7 kg)] 270 lb 8.2 oz (122.7 kg) (06/26 2102) Physical Exam: General: obese male lying in bed, alert and orientation x 3 Cardiovascular: murmur from AV fistula, RRR Respiratory: CTAB Abdomen: soft, nontender, active bowel sounds Extremities: R knee swollen, nonerythematous, tenderness much improved today put still present on medial side, bark-like appearance in distal shins bilaterally  Laboratory: Recent Labs  Lab 10/29/17 1550 11/03/17 0031 11/04/17 0505  WBC  --  4.9 4.5  HGB 8.1* 9.6* 9.0*  HCT  --  31.4* 29.6*  PLT  --  115* 95*   Recent Labs  Lab 11/03/17 0031 11/04/17 0505 11/05/17 0518  NA 138 136 136  K 3.2* 3.5 3.5  CL 97* 98 99  CO2 29 25 27   BUN 12 19 13   CREATININE 5.20* 7.02* 5.35*  CALCIUM 8.2* 8.0* 7.6*  GLUCOSE 70 55* 71    Imaging/Diagnostic Tests: [place] Chest Soft Tissue  Result Date: 11/03/2017 CLINICAL DATA:  Diffuse swelling along the LEFT back, flank, chest and axilla EXAM: ULTRASOUND OF HEAD/NECK SOFT TISSUES TECHNIQUE: Ultrasound examination of the LEFT flank, back, chest and axillary soft tissues was performed in the area of clinical concern. COMPARISON:  None FINDINGS: Diffuse subcutaneous edema along the LEFT flank, back, chest and axilla identified. No discrete abscess. No other abnormalities noted. IMPRESSION: Diffuse edema along the LEFT back, chest and axilla. No discrete abscess. Electronically Signed   By: Margarette Canada M.D.   On: 11/03/2017 18:50     Kathrene Alu, MD 11/05/2017, 7:05 AM PGY-1, [place] Intern pager: [phone], text pages  welcome

## 2017-11-05 NOTE — Progress Notes (Signed)
I was called by the patient's family. I spoke with the patient over the phone. He has significantly worse right knee pain and swelling over the past week or so. He is mobilizing poorly with PT. I follow him for knee arthritis and effusions, which are always clear and straw colored. His R knee aspirate from the ED on 6/24 was cloudy and showed 45,500 WBC with 90% N. Cx negative, crystals negative. Patient is immunocompromised due to ESRD on HD.  Patient had recent infected defibrillator and vascular graft with MSSA bacteremia. Given this history, I think there is a reasonable chance he has septic arthritis even though the culture was (-). Therefore, I recommend R knee arthrotomy and drainage in the OR. Plan for surgery tomorrow. Hold blood thinners. NPO after MN. Patient agrees with plan.

## 2017-11-05 NOTE — Progress Notes (Addendum)
Discussed with patient that recommending outpatient follow up with orthopedics for joint aspiration with steroid injection. Patient voiced good understanding regarding recommendation but still requesting that be done inpatient prior to discharge today. Discussed unlikely to make a long term difference in discomfort or acute difference in ambulation and that fluid is likely to reaccumulate. Also discussed that low risk of infection. Patient voiced good understanding and that this would not be holding up discharge home today. Discussed with attending Dr. Erin Hearing who agreed to supervise a R knee joint aspiration and steroid injection. Plan for joint aspiration with steroid injection inpatient prior to DC home.   Bufford Lope, DO PGY-2, [facility] 11/05/2017 4:04 PM

## 2017-11-05 NOTE — Progress Notes (Addendum)
Discussed with patient in person regarding newest orthopedics recommendations. Patient agreeable and voiced appreciation regarding update.   Called ID per ortho recommendation. Dr. Baxter Flattery recommended staying off IV ABX until surgery to repeat cultures since patient is afebrile and stable. Will get ESR and CRP.   Bufford Lope, DO PGY-2, [facility] 11/05/2017 4:50 PM

## 2017-11-05 NOTE — Evaluation (Signed)
Physical Therapy Evaluation Patient Details Name: Adam [name]. MRN: [id number] DOB: 05/28/1960 Today's Date: 11/05/2017   History of Present Illness  Pt is a 57 y/o male admitted secondary to R knee pain. Suspected R knee septic arthritis. Pt also s/p UE venography which showed R central vein ocllusion. PMH includes ESRD on HD MWF, a fib, HTN, and s/p cardiac defibrillator.   Clinical Impression  Pt admitted secondary to problem above with deficits below. Pt limited by R knee pain this session and requiring mod to max A for basic transfers and side stepping at EOB using RW. Also spoke with pt's wife prior to session, and pt's wife very concerned about pt going home. Feel pt is at increased risk for falls and will have difficulty performing necessary mobility/ADL tasks at home. Likely to OR 6/27 for R knee aspiration per notes. Pt will likely progress well once knee pain controlled, so will continue to follow and update recommendations based on pt progress. Will continue to follow acutely to maximize functional mobility independence and safety.     Follow Up Recommendations Other (comment);Supervision for mobility/OOB(CIR vs SNF )    Equipment Recommendations  Rolling walker with 5" wheels    Recommendations for Other Services       Precautions / Restrictions Precautions Precautions: Fall Restrictions Weight Bearing Restrictions: No      Mobility  Bed Mobility               General bed mobility comments: Sitting EOB upon entry.   Transfers Overall transfer level: Needs assistance Equipment used: Rolling walker (2 wheeled) Transfers: Sit to/from Stand Sit to Stand: Mod assist;Max assist;From elevated surface         General transfer comment: Mod to max A for lift assist and steadying. Increased time to perform task secondary to pain. Verbal cues for UE placement. Limited weightshift noted on RLE.   Ambulation/Gait Ambulation/Gait assistance: Mod assist    Assistive device: Rolling walker (2 wheeled)   Gait velocity: Decreased    General Gait Details: Pt only able to take a few side steps at EOB secondary to increased pain. Unsteadiness as pt with limited weightshift to RLE. Required mod A for steadying assist and verbal cues with use of RW.   Stairs            Wheelchair Mobility    Modified Rankin (Stroke Patients Only)       Balance Overall balance assessment: Needs assistance Sitting-balance support: No upper extremity supported;Feet supported Sitting balance-Leahy Scale: Good     Standing balance support: Bilateral upper extremity supported;During functional activity Standing balance-Leahy Scale: Poor Standing balance comment: REliant on BUE support.                              Pertinent Vitals/Pain Pain Assessment: Faces Faces Pain Scale: Hurts whole lot Pain Location: R knee  Pain Descriptors / Indicators: Sharp Pain Intervention(s): Limited activity within patient's tolerance;Monitored during session;Repositioned    Home Living Family/patient expects to be discharged to:: Private residence Living Arrangements: Spouse/significant other Available Help at Discharge: Family;Available PRN/intermittently Type of Home: House Home Access: Stairs to enter Entrance Stairs-Rails: None Entrance Stairs-Number of Steps: 1(threshold ) Home Layout: Two level Home Equipment: None      Prior Function Level of Independence: Independent               Hand Dominance   Dominant Hand: Right  Extremity/Trunk Assessment   Upper Extremity Assessment Upper Extremity Assessment: Defer to OT evaluation(RUE swelling )    Lower Extremity Assessment Lower Extremity Assessment: Generalized weakness;RLE deficits/detail RLE Deficits / Details: R knee swelling noted and pain with limited ROM into knee flexion and extension. Pt reports R knee buckling during ambulation.     Cervical / Trunk  Assessment Cervical / Trunk Assessment: Normal  Communication   Communication: No difficulties  Cognition Arousal/Alertness: Awake/alert Behavior During Therapy: WFL for tasks assessed/performed Overall Cognitive Status: Within Functional Limits for tasks assessed                                        General Comments General comments (skin integrity, edema, etc.): Spoke with pt's wife concerning mobility limitations. Pt's wife very concerned about current mobility limitations and reports pt not safe to go home.     Exercises     Assessment/Plan    PT Assessment Patient needs continued PT services  PT Problem List Decreased strength;Decreased range of motion;Decreased activity tolerance;Decreased balance;Decreased mobility;Decreased knowledge of use of DME;Pain       PT Treatment Interventions DME instruction;Gait training;Stair training;Functional mobility training;Therapeutic activities;Therapeutic exercise;Balance training;Patient/family education    PT Goals (Current goals can be found in the Care Plan section)  Acute Rehab PT Goals Patient Stated Goal: to decrease R knee pain  PT Goal Formulation: With patient Time For Goal Achievement: 11/19/17 Potential to Achieve Goals: Good    Frequency Min 3X/week   Barriers to discharge        Co-evaluation               AM-PAC PT "6 Clicks" Daily Activity  Outcome Measure Difficulty turning over in bed (including adjusting bedclothes, sheets and blankets)?: A Little Difficulty moving from lying on back to sitting on the side of the bed? : A Little Difficulty sitting down on and standing up from a chair with arms (e.g., wheelchair, bedside commode, etc,.)?: Unable Help needed moving to and from a bed to chair (including a wheelchair)?: A Lot Help needed walking in hospital room?: A Lot Help needed climbing 3-5 steps with a railing? : Total 6 Click Score: 12    End of Session Equipment Utilized  During Treatment: Gait belt Activity Tolerance: Patient limited by pain Patient left: in bed;with call bell/phone within reach(sitting EOB ) Nurse Communication: Mobility status PT Visit Diagnosis: Unsteadiness on feet (R26.81);Other abnormalities of gait and mobility (R26.89);Pain Pain - Right/Left: Right Pain - part of body: Knee    Time: 1535-1558 PT Time Calculation (min) (ACUTE ONLY): 23 min   Charges:   PT Evaluation $PT Eval Moderate Complexity: 1 Mod PT Treatments $Therapeutic Activity: 8-22 mins   PT G Codes:        Adam [name], PT, DPT  Acute Rehabilitation Services  Pager: [phone]   Adam [name] 11/05/2017, 5:22 PM

## 2017-11-05 NOTE — Discharge Summary (Signed)
[name]  Patient name: Adam [name]. Medical record number: [id number] Date of birth: 09/13/1960 Age: 57 y.o. 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 was admitted on 11/02/17 after presenting to the [facility] ED for right knee pain and swelling.  He was transferred to [facility] since he would need dialysis during hospitalization.  His knee was aspirated in the [facility] ED and a WBC of 45,000 with 90% neutrophils was found.  Gram stain did not show any organisms, but the fluid was cultured.  Blood cultures were also obtained.  Due to patient's MSSA bacteremia in April 2019, concern was high for septic arthritis even though his WBC count was below the threshold of 50,000 commonly used for septic arthritis.  He was started on vancomycin after consulting orthopedics.  Dr. Alvan Dame, orthopedist, assessed patient on 6/25 and advised that if fluid and blood cultures returned with no  growth, vancomycin could be stopped and the patient could be discharged, since the joint swelling would likely be due to inflammation rather than infection.  On 6/26, vancomycin was stopped given no growth in fluid or blood cultures for >24 hours and improvement in patient's knee pain and mobility.  On 6/27, patient's knee continued to improve off of vancomycin and cultures showed no growth x 48 hours, so patient was prepared for discharge after Dr. Alvan Dame gave approval.  However, patient's wife was worried that his pain was increasing, so she called patient's outpatient orthopedist, Dr. Lyla Glassing, who determined that patient's knee warranted a washout given presence of cloudy fluid during aspiration and recent MSSA bacteremia.  Washout was performed on 6/28, and pain was controlled with Tramadol after this procedure.  ID was consulted about antibiotic regimen and advised that vancomycin and ceftriaxone be started after fluid cultures were collected during the washout.  Repeat blood cultures were also collected.  No growth resulted from these repeat fluid and blood cultures.  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  They recommended deep tissue biopsy by outpatient general surgery and transitioning to Eliquis rather than warfarin.  Vascular surgery also performed RUE venography to see if a new AV fistula could be placed there, but determined that this could not be done.  They then performed ligation of right brachial vein transposition with left HeRO graft-catheter placement on 11/10/2017.      Issues for Follow Up:  1. Patient needs a general surgery consult for a skin biopsy.  Biopsy order must include possible calciphylaxis as reason for biopsy. 2. Once cleared by vascular surgery, warfarin  should be stopped and Eliquis 2.5 mg BID started per nephrology recommendations since warfarin can  increase the risk of calciphylaxis. 3. Patient will continue to receive ceftriaxone and vancomycin with hemodialysis until reaches 4 weeks of treatment on 7/26 4. Patient's protonix decreased from 40mg  BID to QD.  He is to take for two weeks and then discontinue.  He is asymptomatic and been taking the medication since Endoscopy in April for bleeding concern. 5. Patient to discontinue taking Sucralfate, as original prescription only for 1 month, which ended 6/15.  Significant Procedures: RUE venography, right knee washout, left HeRO graft-catheter placement  Significant Labs and Imaging:  Recent Labs  Lab 11/07/17 0803 11/09/17 0432 11/10/17 0529 11/10/17 1253  WBC 5.1 4.7 5.0  --   HGB 9.0* 8.7* 8.7* 10.5*  HCT 30.6* 28.4* 28.5* 31.0*  PLT 127* 124* 129*  --    Recent Labs  Lab 11/05/17 0518 11/06/17 0500 11/07/17 0803 11/09/17 0432 11/10/17 0529 11/10/17 1253  NA 136 134* 137 134* 136 134*  K 3.5 3.5 3.3* 3.4* 3.5 3.9  CL 99 97* 96* 96* 97*  --   CO2 27 26 28 24 25   --   GLUCOSE 71 126* 83 74 82  --   BUN 13 21* 13 23* 16  --   CREATININE 5.35* 6.78* 5.03* 7.49* 5.94*  --   CALCIUM 7.6* 8.1* 8.1* 7.7* 7.8*  --   PHOS  --  2.7  --   --   --   --   ALBUMIN  --  3.0*  --   --   --   --       Results/Tests Pending at Time of Discharge: Blood culture no growth at 5 days, fluid cultures no growth at 4 days  Discharge Medications:  Allergies as of 11/11/2017      Reactions   Enalaprilat Swelling, Other (See Comments)   Mouth swelling   Vasotec [enalapril] Swelling, Other (See Comments)   MOUTH SWELLING   Iodinated Diagnostic Agents Nausea And Vomiting, Nausea Only      Medication List    STOP taking these medications   sucralfate 1 g tablet Commonly known as:  CARAFATE   warfarin 6 MG tablet Commonly known as:  COUMADIN     TAKE these medications   ambrisentan 10 MG tablet Commonly known as:  LETAIRIS Take 1 tablet (10 mg total) by mouth daily.    amiodarone 200 MG tablet Commonly known as:  PACERONE Take 200 mg by mouth every evening.   apixaban 2.5 MG Tabs tablet Commonly known as:  ELIQUIS Take 1 tablet (2.5 mg total) by mouth 2 (two) times daily.   atorvastatin 40 MG tablet Commonly known as:  LIPITOR Take 1 tablet (40 mg total) by mouth every evening.   calcium acetate 667 MG capsule Commonly known as:  PHOSLO Take 2 capsules (1,334 mg total) by mouth 3 (three) times daily with meals.   ceFAZolin 2-4 GM/100ML-% IVPB Commonly known as:  ANCEF Inject 100 mLs (2 g total) into the vein every Monday, Wednesday, and Friday at 6 PM for 10 doses.   cinacalcet 30 MG tablet Commonly known as:  SENSIPAR Take 3 tablets (90 mg total) by mouth every evening.   levothyroxine 300 MCG tablet Commonly known as:  SYNTHROID, LEVOTHROID Take 1 tablet (300 mcg total) by mouth daily before breakfast.   midodrine 10 MG tablet Commonly known as:  PROAMATINE TAKE 1 TABLET (10 MG TOTAL) BY MOUTH 3 (  THREE) TIMES DAILY.   multivitamin Tabs tablet TAKE 1 TABLET BY MOUTH EVERYDAY AT BEDTIME   pantoprazole 40 MG tablet Commonly known as:  PROTONIX Take 1 tablet (40 mg total) by mouth daily. For 14 days, then stop. What changed:    when to take this  additional instructions   tadalafil 20 MG tablet Commonly known as:  ADCIRCA/CIALIS Take 20 mg by mouth daily.   traMADol 50 MG tablet Commonly known as:  ULTRAM Take 1 tablet (50 mg total) by mouth every 6 (six) hours as needed for severe pain.   vancomycin 1-5 GM/200ML-% Soln Commonly known as:  VANCOCIN Inject 200 mLs (1,000 mg total) into the vein every Monday, Wednesday, and Friday with hemodialysis for 10 doses.            Durable Medical Equipment  (From admission, onward)        Start     Ordered   11/11/17 1029  For home use only DME 3 n 1  Once     11/11/17 1029   11/10/17 1606  For home use only DME Walker rolling  Once    Comments:  3 in 1  Question:   Patient needs a walker to treat with the following condition  Answer:  Septic arthritis ([place])   11/10/17 1606   11/10/17 1540  For home use only DME Shower stool  Once    Comments:  Septic Arthritis   11/10/17 1541      Discharge Instructions: Please refer to Patient Instructions section of EMR for full details.  Patient was counseled important signs and symptoms that should prompt return to medical care, changes in medications, dietary instructions, activity restrictions, and follow up appointments.  Follow-Up Appointments: Follow-up Information    Swinteck, Aaron Edelman, MD. Schedule an appointment as soon as possible for a visit in 2 weeks.   Specialty:  Orthopedic Surgery Why:  For suture removal Contact information: [address] [place] [address] [phone]        [facility], [facility]-Home Follow up.   Specialty:  Home Health Services Why:  For home health PT, a RW and a 3/1 will be delivered to the room prior to DC.  Contact information: [place] [address] [phone]           Gregery Na, DO 11/11/2017, 12:31 PM PGY-1, [place]

## 2017-11-05 NOTE — Treatment Plan (Signed)
ID TREATMENT NOTE  Formal consult to follow. Patient's chart reviewed with noted recent history of MSSA infection with fistula and bacteremia which appears completed resolved. He has had no fevers or evidence of systemic infection during hospitalization to date.    1. Recommend holding antibiotics until after planned irrigation and debridement of the right knee.  2. Request to obtain cultures from surgical procedure to guide therapy.  3. Recommend starting vancomycin and ceftriaxone following surgical procedure for empiric coverage.

## 2017-11-05 NOTE — Interval H&P Note (Signed)
    History and Physical Update  The patient was interviewed and re-examined.  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  The patient has developed a swollen Right arm post-procedure concerning for central venous occlusion.  The patient presents for right arm and central venogram.  The patient was given pre-medication prior proceeding.   Risk of extremity and central venography include but are not limited to: anaphylaxis, bleeding, infection, and incomplete visualization of the venous system.  The patient is aware of these risks and has elected to proceed.  Adele Barthel, MD, FACS [facility] Office: [phone] Pager: [phone]  11/05/2017, 0800

## 2017-11-06 ENCOUNTER — Inpatient Hospital Stay (HOSPITAL_COMMUNITY): Payer: Medicare HMO | Admitting: Certified Registered"

## 2017-11-06 ENCOUNTER — Telehealth: Payer: Self-pay | Admitting: *Deleted

## 2017-11-06 ENCOUNTER — Encounter (HOSPITAL_COMMUNITY)
Admission: EM | Disposition: A | Discharge status: Home Health | Payer: Self-pay | Source: Home / Self Care | Attending: Family Medicine

## 2017-11-06 ENCOUNTER — Other Ambulatory Visit: Payer: Self-pay | Admitting: *Deleted

## 2017-11-06 ENCOUNTER — Encounter (HOSPITAL_COMMUNITY): Payer: Self-pay | Admitting: Anesthesiology

## 2017-11-06 HISTORY — PX: I & D EXTREMITY: SHX5045

## 2017-11-06 LAB — CBC
HCT: 30.4 % — ABNORMAL LOW (ref 39.0–52.0)
Hemoglobin: 9.3 g/dL — ABNORMAL LOW (ref 13.0–17.0)
MCH: 27.9 pg (ref 26.0–34.0)
MCHC: 30.6 g/dL (ref 30.0–36.0)
MCV: 91.3 fL (ref 78.0–100.0)
PLATELETS: 143 10*3/uL — AB (ref 150–400)
RBC: 3.33 MIL/uL — ABNORMAL LOW (ref 4.22–5.81)
RDW: 18.5 % — AB (ref 11.5–15.5)
WBC: 6.3 10*3/uL (ref 4.0–10.5)

## 2017-11-06 LAB — RENAL FUNCTION PANEL
Albumin: 3 g/dL — ABNORMAL LOW (ref 3.5–5.0)
Anion gap: 11 (ref 5–15)
BUN: 21 mg/dL — ABNORMAL HIGH (ref 6–20)
CALCIUM: 8.1 mg/dL — AB (ref 8.9–10.3)
CO2: 26 mmol/L (ref 22–32)
Chloride: 97 mmol/L — ABNORMAL LOW (ref 98–111)
Creatinine, Ser: 6.78 mg/dL — ABNORMAL HIGH (ref 0.61–1.24)
GFR calc Af Amer: 9 mL/min — ABNORMAL LOW (ref >60)
GFR calc non Af Amer: 8 mL/min — ABNORMAL LOW (ref >60)
GLUCOSE: 126 mg/dL — AB (ref 70–99)
Phosphorus: 2.7 mg/dL (ref 2.5–4.6)
Potassium: 3.5 mmol/L (ref 3.5–5.1)
SODIUM: 134 mmol/L — AB (ref 135–145)

## 2017-11-06 LAB — PROTIME-INR
INR: 2.19
INR: 2.19
PROTHROMBIN TIME: 24.2 s — AB (ref 11.4–15.2)
Prothrombin Time: 24.2 seconds — ABNORMAL HIGH (ref 11.4–15.2)

## 2017-11-06 LAB — BODY FLUID CULTURE
Culture: NO GROWTH
SPECIAL REQUESTS: NORMAL

## 2017-11-06 LAB — GLUCOSE, CAPILLARY: Glucose-Capillary: 97 mg/dL (ref 70–99)

## 2017-11-06 SURGERY — IRRIGATION AND DEBRIDEMENT EXTREMITY
Anesthesia: General | Site: Knee | Laterality: Right

## 2017-11-06 MED ORDER — ONDANSETRON HCL 4 MG/2ML IJ SOLN
INTRAMUSCULAR | Status: AC
  Filled 2017-11-06: qty 2

## 2017-11-06 MED ORDER — SODIUM CHLORIDE 0.9 % IV SOLN
2.0000 g | INTRAVENOUS | Status: DC
  Administered 2017-11-06 – 2017-11-08 (×3): 2 g via INTRAVENOUS
  Filled 2017-11-06 (×3): qty 20

## 2017-11-06 MED ORDER — MEPERIDINE HCL 50 MG/ML IJ SOLN: 6.2500 mg | INTRAMUSCULAR | Status: DC | PRN

## 2017-11-06 MED ORDER — ONDANSETRON HCL 4 MG PO TABS: 4.0000 mg | ORAL_TABLET | Freq: Four times a day (QID) | ORAL | Status: DC | PRN

## 2017-11-06 MED ORDER — SODIUM CHLORIDE 0.9 % IR SOLN
Status: DC | PRN
  Administered 2017-11-06: 3000 mL

## 2017-11-06 MED ORDER — FENTANYL CITRATE (PF) 100 MCG/2ML IJ SOLN
INTRAMUSCULAR | Status: DC | PRN
  Administered 2017-11-06 (×5): 50 ug via INTRAVENOUS

## 2017-11-06 MED ORDER — FENTANYL CITRATE (PF) 100 MCG/2ML IJ SOLN
INTRAMUSCULAR | Status: AC
  Filled 2017-11-06: qty 2

## 2017-11-06 MED ORDER — LIDOCAINE HCL (CARDIAC) PF 100 MG/5ML IV SOSY
PREFILLED_SYRINGE | INTRAVENOUS | Status: DC | PRN
  Administered 2017-11-06: 30 mg via INTRAVENOUS

## 2017-11-06 MED ORDER — CHLORHEXIDINE GLUCONATE 4 % EX LIQD
60.0000 mL | Freq: Once | CUTANEOUS | Status: AC
  Administered 2017-11-07: 4 via TOPICAL
  Filled 2017-11-06: qty 60

## 2017-11-06 MED ORDER — 0.9 % SODIUM CHLORIDE (POUR BTL) OPTIME
TOPICAL | Status: DC | PRN
  Administered 2017-11-06: 1000 mL

## 2017-11-06 MED ORDER — TRAMADOL HCL 50 MG PO TABS
50.0000 mg | ORAL_TABLET | Freq: Four times a day (QID) | ORAL | Status: DC | PRN
  Administered 2017-11-06 – 2017-11-11 (×5): 50 mg via ORAL
  Filled 2017-11-06 (×5): qty 1

## 2017-11-06 MED ORDER — LIDOCAINE 2% (20 MG/ML) 5 ML SYRINGE
INTRAMUSCULAR | Status: AC
  Filled 2017-11-06: qty 5

## 2017-11-06 MED ORDER — SODIUM CHLORIDE 0.9 % IV SOLN
INTRAVENOUS | Status: DC | PRN
  Administered 2017-11-06 (×2): via INTRAVENOUS

## 2017-11-06 MED ORDER — PROMETHAZINE HCL 25 MG/ML IJ SOLN: 6.2500 mg | INTRAMUSCULAR | Status: DC | PRN

## 2017-11-06 MED ORDER — DOCUSATE SODIUM 100 MG PO CAPS
100.0000 mg | ORAL_CAPSULE | Freq: Two times a day (BID) | ORAL | Status: DC
  Filled 2017-11-06 (×8): qty 1

## 2017-11-06 MED ORDER — PROPOFOL 10 MG/ML IV BOLUS
INTRAVENOUS | Status: AC
  Filled 2017-11-06: qty 20

## 2017-11-06 MED ORDER — SODIUM CHLORIDE 0.9 % IV SOLN
INTRAVENOUS | Status: DC
  Administered 2017-11-06 – 2017-11-10 (×3): via INTRAVENOUS

## 2017-11-06 MED ORDER — VANCOMYCIN HCL IN DEXTROSE 1-5 GM/200ML-% IV SOLN
1000.0000 mg | INTRAVENOUS | Status: DC
  Administered 2017-11-06 – 2017-11-09 (×2): 1000 mg via INTRAVENOUS
  Filled 2017-11-06 (×3): qty 200

## 2017-11-06 MED ORDER — FENTANYL CITRATE (PF) 100 MCG/2ML IJ SOLN
25.0000 ug | INTRAMUSCULAR | Status: DC | PRN
  Administered 2017-11-06 (×2): 50 ug via INTRAVENOUS

## 2017-11-06 MED ORDER — METOCLOPRAMIDE HCL 5 MG/ML IJ SOLN: 5.0000 mg | Freq: Three times a day (TID) | INTRAMUSCULAR | Status: DC | PRN

## 2017-11-06 MED ORDER — PHENYLEPHRINE HCL 10 MG/ML IJ SOLN
INTRAVENOUS | Status: DC | PRN
  Administered 2017-11-06: 20 ug/min via INTRAVENOUS

## 2017-11-06 MED ORDER — ONDANSETRON HCL 4 MG/2ML IJ SOLN: 4.0000 mg | Freq: Four times a day (QID) | INTRAMUSCULAR | Status: DC | PRN

## 2017-11-06 MED ORDER — SODIUM CHLORIDE 0.9 % IV SOLN: INTRAVENOUS | Status: DC

## 2017-11-06 MED ORDER — LACTATED RINGERS IV SOLN: INTRAVENOUS | Status: DC

## 2017-11-06 MED ORDER — METOCLOPRAMIDE HCL 5 MG PO TABS: 5.0000 mg | ORAL_TABLET | Freq: Three times a day (TID) | ORAL | Status: DC | PRN

## 2017-11-06 MED ORDER — PROPOFOL 10 MG/ML IV BOLUS
INTRAVENOUS | Status: DC | PRN
  Administered 2017-11-06: 200 mg via INTRAVENOUS

## 2017-11-06 MED ORDER — CEFAZOLIN SODIUM-DEXTROSE 2-4 GM/100ML-% IV SOLN: 2.0000 g | INTRAVENOUS | Status: DC

## 2017-11-06 MED ORDER — PHENYLEPHRINE HCL 10 MG/ML IJ SOLN
INTRAMUSCULAR | Status: DC | PRN
  Administered 2017-11-06: 100 ug via INTRAVENOUS

## 2017-11-06 MED ORDER — FENTANYL CITRATE (PF) 250 MCG/5ML IJ SOLN
INTRAMUSCULAR | Status: AC
  Filled 2017-11-06: qty 5

## 2017-11-06 MED ORDER — CHLORHEXIDINE GLUCONATE 4 % EX LIQD
60.0000 mL | Freq: Once | CUTANEOUS | Status: AC
  Administered 2017-11-06: 4 via TOPICAL
  Filled 2017-11-06 (×2): qty 60

## 2017-11-06 SURGICAL SUPPLY — 34 items
BANDAGE ELASTIC 6 VELCRO ST LF (GAUZE/BANDAGES/DRESSINGS) ×4 IMPLANT
BANDAGE ESMARK 6X9 LF (GAUZE/BANDAGES/DRESSINGS) ×1 IMPLANT
BNDG ESMARK 6X9 LF (GAUZE/BANDAGES/DRESSINGS) ×2
BNDG GAUZE ELAST 4 BULKY (GAUZE/BANDAGES/DRESSINGS) ×2 IMPLANT
COVER SURGICAL LIGHT HANDLE (MISCELLANEOUS) ×4 IMPLANT
CUFF TOURNIQUET SINGLE 18IN (TOURNIQUET CUFF) ×2 IMPLANT
CUFF TOURNIQUET SINGLE 34IN LL (TOURNIQUET CUFF) IMPLANT
DRSG ADAPTIC 3X8 NADH LF (GAUZE/BANDAGES/DRESSINGS) ×2 IMPLANT
DRSG PAD ABDOMINAL 8X10 ST (GAUZE/BANDAGES/DRESSINGS) ×2 IMPLANT
DURAPREP 26ML APPLICATOR (WOUND CARE) ×2 IMPLANT
FACESHIELD WRAPAROUND (MASK) ×2 IMPLANT
GAUZE SPONGE 4X4 12PLY STRL (GAUZE/BANDAGES/DRESSINGS) ×2 IMPLANT
GLOVE BIOGEL M 7.0 STRL (GLOVE) IMPLANT
GLOVE BIOGEL M STRL SZ7.5 (GLOVE) IMPLANT
GLOVE BIOGEL PI IND STRL 7.5 (GLOVE) ×1 IMPLANT
GLOVE BIOGEL PI IND STRL 8.5 (GLOVE) ×1 IMPLANT
GLOVE BIOGEL PI INDICATOR 7.5 (GLOVE) ×1
GLOVE BIOGEL PI INDICATOR 8.5 (GLOVE) ×1
GLOVE ECLIPSE 8.0 STRL XLNG CF (GLOVE) IMPLANT
GLOVE ORTHO TXT STRL SZ7.5 (GLOVE) ×4 IMPLANT
GLOVE SURG ORTHO 8.5 STRL (GLOVE) ×6 IMPLANT
GOWN STRL REUS W/TWL LRG LVL3 (GOWN DISPOSABLE) ×2 IMPLANT
GOWN STRL REUS W/TWL XL LVL3 (GOWN DISPOSABLE) ×4 IMPLANT
HANDPIECE INTERPULSE COAX TIP (DISPOSABLE) ×1
KIT BASIN OR (CUSTOM PROCEDURE TRAY) ×2 IMPLANT
MANIFOLD NEPTUNE II (INSTRUMENTS) ×2 IMPLANT
PACK ORTHO EXTREMITY (CUSTOM PROCEDURE TRAY) ×2 IMPLANT
PAD ABD 8X10 STRL (GAUZE/BANDAGES/DRESSINGS) ×4 IMPLANT
PAD CAST 4YDX4 CTTN HI CHSV (CAST SUPPLIES) ×1 IMPLANT
PADDING CAST COTTON 4X4 STRL (CAST SUPPLIES) ×1
PADDING CAST COTTON 6X4 STRL (CAST SUPPLIES) ×2 IMPLANT
SET HNDPC FAN SPRY TIP SCT (DISPOSABLE) ×1 IMPLANT
SYR CONTROL 10ML LL (SYRINGE) ×2 IMPLANT
TOWEL OR 17X26 10 PK STRL BLUE (TOWEL DISPOSABLE) ×4 IMPLANT

## 2017-11-06 NOTE — Progress Notes (Signed)
Family Medicine Teaching Service Daily Progress Note Intern Pager: [phone]  Patient name: Adam [name]. Medical record number: [id number] Date of birth: 01/18/1961 Age: 57 y.o. Gender: male  Primary Care Provider: [name], MD Consultants: orthopedic surgery, nephrology Code Status: full  Pt Overview and Major Events to Date:  Admitted to [facility], transferred to [facility] for dialysis and treatment 6/24 Received dialysis on 6/26 Approved for discharge from ortho perspective 6/27  Assessment and Plan: Adam [name]. is a 57 y.o. male presenting with right leg pain, redness, and swelling.  His medical history includes ESRD on dialysis MWF, last dialysis on 6/24, atrial fibrillation on coumadin.  Moderate aortic stenosis, PAH, nonischemic cardiomyopathy with removal of ICD in April this year following MSSA bacteremia due to AV fistula infection.  R knee pain and swelling, improving:  Septic vs inflammatory process in joint, likely inflammatory now given negative gram stain and culture.  Culture report with no growth x 2 days, no organisms found on gram stain.  Uric acid level is normal.  After wife spoke with Dr. Lyla Glassing, he believes patient may have septic arthritis and will wash out knee today. - follow up repeat blood cultures - no growth<12 hours - follow up intraoperative fluid culture - monitor clinical status of right knee and leg  Painful subcutaneous masses: Three painful, swollen areas on L hip, axilla, and thoracolumbar area.  Have grown from quarter size to grapefruit size since April and are tender to palpation.  [place] only positive for edema, no abscesses visualized.  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 - surgery consult outpatient, will need biopsy of the subcutaneous tissue - stop warfarin, start Eliquis after patient's  outpatient vascular surgery procedure  ESRD: MWF.  Takes midodrine 10 mg TID AC for hypotension and sensipar 90 mg daily at 1700.  Dr. Bridgett Larsson performed a RUE venography and determined that his right AV fistula will not be usable.  They will try to get more access on left side.   - follow up in one week outpatient and hold warfarin until then per Dr. Lianne Moris recommendations - continue home midodrine and sensipar  Recent MSSA bacteremia: occurred in April 2019, cultured from AV fistula wound and blood.  AV fistula was ligated, and he is currently getting dialysis from a perm cath, although RUE fistula is maturing.  ICD was removed at that time given bacteremia.  A TEE was performed but results are not in system. - f/u repeat blood cultures - NG x 12 hours  Anemia of chronic disease: Hemoglobin 9.0 on 6/26, appears to be around his baseline of ~9.  MCV in normal range.  Platelets are low at  but also seem to be at baseline.  Atrial fibrillation: Regular rhythm during admission, takes amiodarone 200 mg daily and coumadin 6 mg on Sunday, 9 mg other days.  INR therapeutic at 2.05 on 6/24. - continue amiodarone - Eliquis after vascular procedure  PAH: Echo on 08/18/17 showed an PA peak pressure of 59 mmHg. Takes ambrisentan 10 mg daily and tadalafil 20 mg daily. - continue home medications   HFpEF with aortic stenosis: Echo with EF of 60-65%, dilated IVC with elevated central venous pressure, moderate aortic stenosis with mean gradient of 25 mmHg. - administer fluids cautiously if needed   HLD: takes atorvastatin 40 mg daily - continue home atorvastatin  GERD: takes protonix  40 mg daily and sucralfate 1 g BID at home - continue home meds   Hypothyroidism: Iatrogenic d/t thyroidectomy for treatment of thyroid cancer.  Takes synthroid 300 mcg daily.  Last TSH was 3.73 in Sept 2018.  TSH 0.696 on 6/25 - continue synthroid   FEN/GI: renal diet PPx: SCDs  Disposition: home hopefully  6/28  Subjective:  He says his knee is about the same as yesterday.  He has no other complaints  Objective: Temp:  [97.8 F (36.6 C)-98.5 F (36.9 C)] 97.8 F (36.6 C) (06/28 0516) Pulse Rate:  [0-92] 78 (06/28 0516) Resp:  [0-99] 18 (06/28 0516) BP: (83-107)/(52-75) 93/59 (06/28 0516) SpO2:  [0 %-100 %] 93 % (06/28 0516) Weight:  [270 lb 8.3 oz (122.7 kg)] 270 lb 8.3 oz (122.7 kg) (06/27 2100) Physical Exam: General: obese male lying in bed, alert, comfortable Cardiovascular: murmur from AV fistula, RRR Respiratory: CTAB Abdomen: soft, nontender, active bowel sounds Extremities: R knee swelling improved, tender to palpation at start of exam but not by the end of exam, bark-like appearance in distal shins bilaterally  Laboratory: Recent Labs  Lab 11/03/17 0031 11/04/17 0505 11/05/17 2013  WBC 4.9 4.5 5.8  HGB 9.6* 9.0* 10.0*  HCT 31.4* 29.6* 33.1*  PLT 115* 95* 159   Recent Labs  Lab 11/03/17 0031 11/04/17 0505 11/05/17 0518  NA 138 136 136  K 3.2* 3.5 3.5  CL 97* 98 99  CO2 29 25 27   BUN 12 19 13   CREATININE 5.20* 7.02* 5.35*  CALCIUM 8.2* 8.0* 7.6*  GLUCOSE 70 55* 71    Imaging/Diagnostic Tests: [place] Chest Soft Tissue  Result Date: 11/03/2017 CLINICAL DATA:  Diffuse swelling along the LEFT back, flank, chest and axilla EXAM: ULTRASOUND OF HEAD/NECK SOFT TISSUES TECHNIQUE: Ultrasound examination of the LEFT flank, back, chest and axillary soft tissues was performed in the area of clinical concern. COMPARISON:  None FINDINGS: Diffuse subcutaneous edema along the LEFT flank, back, chest and axilla identified. No discrete abscess. No other abnormalities noted. IMPRESSION: Diffuse edema along the LEFT back, chest and axilla. No discrete abscess. Electronically Signed   By: Margarette Canada M.D.   On: 11/03/2017 18:50     Kathrene Alu, MD 11/06/2017, 6:31 AM PGY-1, [place] Intern pager: [phone], text pages welcome

## 2017-11-06 NOTE — Progress Notes (Signed)
ANTIBIOTIC CONSULT NOTE - INITIAL  Pharmacy Consult for vancomycin Indication: Septic arthritis  Allergies  Allergen Reactions  . Enalaprilat Swelling and Other (See Comments)    Mouth swelling  . Vasotec [Enalapril] Swelling and Other (See Comments)    MOUTH SWELLING  . Iodinated Diagnostic Agents Nausea And Vomiting and Nausea Only    Patient Measurements: Height: 6\' 1"  (185.4 cm) Weight: 269 lb 6.4 oz (122.2 kg) IBW/kg (Calculated) : 79.9  Vital Signs: Temp: 97.3 F (36.3 C) (06/28 1852) Temp Source: Oral (06/28 1852) BP: 95/53 (06/28 1852) Pulse Rate: 64 (06/28 1852) Intake/Output from previous day: 06/27 0701 - 06/28 0700 In: 358 [P.O.:358] Out: 0  Intake/Output from this shift: No intake/output data recorded.  Labs: Recent Labs    11/04/17 0505 11/05/17 0518 11/05/17 2013 11/06/17 0500 11/06/17 0716  WBC 4.5  --  5.8  --  6.3  HGB 9.0*  --  10.0*  --  9.3*  PLT 95*  --  159  --  143*  CREATININE 7.02* 5.35*  --  6.78*  --    Estimated Creatinine Clearance: 16.9 mL/min (A) (by C-G formula based on SCr of 6.78 mg/dL (H)). No results for input(s): VANCOTROUGH, VANCOPEAK, VANCORANDOM, GENTTROUGH, GENTPEAK, GENTRANDOM, TOBRATROUGH, TOBRAPEAK, TOBRARND, AMIKACINPEAK, AMIKACINTROU, AMIKACIN in the last 72 hours.   Microbiology: Recent Results (from the past 720 hour(s))  Clostridium Difficile by PCR([facility])     Status: None   Collection Time: 10/30/17 12:03 PM  Result Value Ref Range Status   Toxigenic C. Difficile by PCR Negative Negative Final  Body fluid culture     Status: None   Collection Time: 11/02/17  8:58 PM  Result Value Ref Range Status   Specimen Description   Final    KNEE RIGHT Performed at [facility] [address] [place], [place] [address]    Special Requests   Final    Normal Performed at [facility], [place] [address] [place], [place] [address]    Gram Stain   Final    MODERATE WBC  PRESENT, PREDOMINANTLY PMN NO ORGANISMS SEEN Gram Stain Report Called to,Read Back By and Verified With: Melessia Kaus,L RN AT 7628 11/02/17 BY TIBBITTS,K Performed at [facility], [place] [address] [place], [place] [address]    Culture   Final    NO GROWTH 3 DAYS Performed at [facility], [place] [address], [place], [place] [address]    Report Status 11/06/2017 FINAL  Final  Culture, blood (routine x 2)     Status: None (Preliminary result)   Collection Time: 11/03/17 12:32 AM  Result Value Ref Range Status   Specimen Description   Final    BLOOD LEFT FOREARM Performed at [facility] [address], [place], [place] [address]    Special Requests   Final    BOTTLES DRAWN AEROBIC AND ANAEROBIC Blood Culture adequate volume Performed at [facility] [address] [place], [place] [address]    Culture   Final    NO GROWTH 3 DAYS Performed at [facility], [place] [address], [place], [place] [address]    Report Status PENDING  Incomplete  MRSA PCR Screening     Status: None   Collection Time: 11/03/17  6:45 AM  Result Value Ref Range Status   MRSA by PCR NEGATIVE NEGATIVE Final    Comment:        The GeneXpert MRSA Assay (FDA approved for NASAL  specimens only), is one component of a comprehensive MRSA colonization surveillance program. It is not intended to diagnose MRSA infection nor to guide or monitor treatment for MRSA infections. Performed at [facility], [place] [address], [place], [place] [address]   Culture, blood (routine x 2)     Status: None (Preliminary result)   Collection Time: 11/05/17  7:17 PM  Result Value Ref Range Status   Specimen Description BLOOD LEFT HAND  Final   Special Requests   Final    BOTTLES DRAWN AEROBIC AND ANAEROBIC Blood Culture adequate volume   Culture   Final    NO GROWTH < 12 HOURS Performed at [facility], [place] [address] [place], [place] [address]    Report  Status PENDING  Incomplete  Culture, blood (routine x 2)     Status: None (Preliminary result)   Collection Time: 11/05/17  7:19 PM  Result Value Ref Range Status   Specimen Description BLOOD LEFT FOREARM  Final   Special Requests   Final    BOTTLES DRAWN AEROBIC ONLY Blood Culture adequate volume   Culture   Final    NO GROWTH < 12 HOURS Performed at [facility], [place] [address], [place], [place] [address]    Report Status PENDING  Incomplete    Medical History: Past Medical History:  Diagnosis Date  . AICD (automatic cardioverter/defibrillator) present    [employer]tr[employer]   . Aortic stenosis    moderate AS by 08/2016 echo  . Arthritis    "knees" (08/05/2016)  . Atrial fibrillation ([place])   . ESRD (end stage renal disease) on dialysis ([place])    "M, T, W, T, F; NX stage hemodialysis; I do it at home" (08/05/2016)  . 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 History of blood transfusion 2009   "S/P biopsy for prostate cancer check"  . Hypertension   . Hypothyroidism (acquired)   . Mitral stenosis    moderate mitral stenosis  . Nonischemic cardiomyopathy ([place])   . Pneumonia 2014; 08/05/2016  . PONV (postoperative nausea and vomiting)   . Pulmonary HTN ([place])   . Renal insufficiency   . Thyroid cancer ([place]) 2011   Assessment: 57 yo male with ESRD on HD MWF with R knee pain and swelling to restart on vancomycin for empiric treatment of septic arthritis. Patient received vancomycin 2 grams total on 6/25, and then 1 gram after HD on Wednesday 6/26. Tolerated HD today 6/28.  Goal of Therapy:  Vancomycin pre-HD levels of 15-25 mcg/ml  Plan:  Vancomycin 1000mg  IV after HD on MWF (give tonight) Monitor pre-HD levels as needed Monitor clinical course and C&S as well as fever curve and CBC   Thank you for allowing [place] to participate in this patients care.   Jens Som, PharmD Please utilize [facility] (under [place]) for appropriate  number for your unit pharmacist. 11/06/2017 7:12 PM

## 2017-11-06 NOTE — Transfer of Care (Signed)
Immediate Anesthesia Transfer of Care Note  Patient: Adam [name].  Procedure(s) Performed: IRRIGATION AND DEBRIDEMENT KNEE (Right )  Patient Location: PACU  Anesthesia Type:General  Level of Consciousness: awake, alert  and oriented  Airway & Oxygen Therapy: Patient Spontanous Breathing  Post-op Assessment: Report given to RN and Post -op Vital signs reviewed and stable  Post vital signs: Reviewed and stable  Last Vitals:  Vitals Value Taken Time  BP 86/60 11/06/2017  5:42 PM  Temp    Pulse 73 11/06/2017  5:45 PM  Resp 16 11/06/2017  5:45 PM  SpO2 98 % 11/06/2017  5:45 PM  Vitals shown include unvalidated device data.  Last Pain:  Vitals:   11/06/17 1141  TempSrc: Oral  PainSc:          Complications: No apparent anesthesia complications

## 2017-11-06 NOTE — Op Note (Signed)
OPERATIVE REPORT   11/06/2017  5:52 PM  PATIENT:  Adam [name].   SURGEON:  Bertram Savin, MD  ASSISTANT: Staff.   PREOPERATIVE DIAGNOSIS: Septic arthritis, right knee.  POSTOPERATIVE DIAGNOSIS:  Same.  PROCEDURE:  Open arthrotomy and drainage right knee with synovectomy.  ANESTHESIA:   GETA.  ANTIBIOTICS: 2 g Ancef.  TUBES AND DRAINS: Medium Hemovac x1.  SPECIMENS: Right knee synovium for tissue culture. Right knee synovial fluid for culture swab.  COMPLICATIONS: None.  DISPOSITION: Stable to PACU.  SURGICAL INDICATIONS:  Adam [name]. is a 57 y.o. male with a history of end-stage renal disease on hemodialysis.  He does have a long-standing history of bilateral knee arthritis with recurrent effusions.  Patient did have an infected defibrillator and AV fistula with MSSA bacteremia about 6 weeks ago.  Over the past week, he has had worsening right knee pain and swelling.  He had an aspiration in the emergency department which showed 45,000 white blood cells and 90% neutrophils.  Culture and crystals were negative.  Due to his immunocompromise status and above-mentioned history, I thought there was a reasonable chance that he could still have a septic arthritis with a negative culture.  Therefore he was indicated for arthrotomy and drainage of the knee.  The risks, benefits, and alternatives were discussed with the patient preoperatively including but not limited to the risks of infection, bleeding, nerve / blood vessel injury, malunion, nonunion, cardiopulmonary complications, the need for repeat surgery, among others, and the patient was willing to proceed.  PROCEDURE IN DETAIL: Identified the patient in the holding area using 2 identifiers.  The surgical site was marked by myself.  He was taken the operating room placed supine on the operating room table.  General anesthesia was induced.  Tourniquet was applied to the right thigh, but it was not utilized.   The right lower extremity was prepped and draped in the normal sterile surgical fashion.  Timeout was called, verifying site and site of surgery.  I made an anterior incision centered over the proximal pole of the patella.  Full-thickness skin flaps were created.  I created a limited medial parapatellar arthrotomy.  He had copious cloudy joint fluid.  I swabbed the joint fluid for culture.  He had irritation to the synovium.  I excisionally debrided the synovium in the suprapatellar pouch, and a representative sample was sent for tissue culture.  I irrigated the knee with 6 L of saline using pulse lavage.  He did have full-thickness arthritis.  Once I was satisfied with the debridement of the knee, I then closed the arthrotomy with #1 Prolene over a medium Hemovac drain.  Deep dermal layer was closed with 2-0 interrupted Monocryl.  Staples for skin.  Sterile dressing was applied.  Patient was then extubated and taken to the PACU in stable condition.  Sponge, needle, and instrument counts were correct at the end of the case x2.  There were no known complications.  POSTOPERATIVE PLAN: Postoperatively, he will be readmitted to the hospitalist.  He may weight-bear as tolerated with a walker.  Begin IV ceftriaxone and vancomycin per infectious disease.  Follow cultures.  Discontinue Hemovac drain when output is less than 30 cc per shift.  Continue physical therapy.

## 2017-11-06 NOTE — Telephone Encounter (Signed)
-----   Message from [name], RN sent at 11/06/2017 10:46 AM EDT ----- Regarding: FW: COUMADIN HOLD FOR 11/10/17 SURGERY   ----- Message ----- From: [name], RN Sent: 11/06/2017  10:38 AM To: Roney Jaffe, MD, Bufford Lope, DO, # Subject: COUMADIN HOLD FOR 11/10/17 SURGERY               Dr. Shan Levans, PLEASE be aware that Mr. Haycraft is scheduled for HERO graft and Ligation with Dr. Scot Dock on 11/10/17. Coumadin hold will need to continue for this procedure. That is why we have expedited the scheduling of this procedure. Please contact Vascular and Vein if any questions.

## 2017-11-06 NOTE — Anesthesia Procedure Notes (Signed)
Procedure Name: LMA Insertion Date/Time: 11/06/2017 4:10 PM Performed by: Eligha Bridegroom, CRNA Pre-anesthesia Checklist: Patient identified, Emergency Drugs available, Suction available, Patient being monitored and Timeout performed Patient Re-evaluated:Patient Re-evaluated prior to induction Oxygen Delivery Method: Circle system utilized Preoxygenation: Pre-oxygenation with 100% oxygen Induction Type: IV induction LMA: LMA flexible inserted and LMA with gastric port inserted LMA Size: 5.0 Placement Confirmation: positive ETCO2 and breath sounds checked- equal and bilateral Dental Injury: Teeth and Oropharynx as per pre-operative assessment

## 2017-11-06 NOTE — Progress Notes (Signed)
PT Cancellation Note  Patient Details Name: Adam [name]. MRN: [id number] DOB: [birth year]   Cancelled Treatment:    Reason Eval/Treat Not Completed: Patient at procedure or test/unavailable (HD).  [name], PT, DPT Acute Rehabilitation Services  Pager: [phone] 11/06/2017, 10:37 AM

## 2017-11-06 NOTE — Progress Notes (Signed)
[facility] Progress Note   Subjective:  Seen on HD. No new c/o.   Objective Vitals:   11/06/17 1030 11/06/17 1045 11/06/17 1100 11/06/17 1141  BP: (!) 96/52 (!) 85/53 (!) 90/48 (!) 79/60  Pulse: 76 77 72 78  Resp:   16 16  Temp:   98.3 F (36.8 C) (!) 97.4 F (36.3 C)  TempSrc:   Oral Oral  SpO2:   99% 97%  Weight:   122.2 kg (269 lb 6.4 oz)   Height:       Physical Exam General: Well appearing, NAD Skin: 3 ill defined tender nodules without subcutaneous fat (2 in L flank, 1 R chest area) Heart: RRR; no murmur Lungs: CTAB Abdomen: soft, non-tender Extremities: Swollen R knee, tense/woody edema in B lower legs. Dialysis Access: TDC in L chest, new R AVG (10/13/17) + edema/ + bruit  Additional Objective Labs: Basic Metabolic Panel: Recent Labs  Lab 11/04/17 0505 11/05/17 0518 11/06/17 0500  NA 136 136 134*  K 3.5 3.5 3.5  CL 98 99 97*  CO2 25 27 26   GLUCOSE 55* 71 126*  BUN 19 13 21*  CREATININE 7.02* 5.35* 6.78*  CALCIUM 8.0* 7.6* 8.1*  PHOS 3.3  --  2.7   Liver Function Tests: Recent Labs  Lab 11/04/17 0505 11/06/17 0500  ALBUMIN 2.8* 3.0*   CBC: Recent Labs  Lab 11/03/17 0031 11/04/17 0505 11/05/17 2013 11/06/17 0716  WBC 4.9 4.5 5.8 6.3  NEUTROABS 4.1  --   --   --   HGB 9.6* 9.0* 10.0* 9.3*  HCT 31.4* 29.6* 33.1* 30.4*  MCV 92.9 92.5 92.7 91.3  PLT 115* 95* 159 143*   Blood Culture    Component Value Date/Time   SDES BLOOD LEFT FOREARM 11/05/2017 1919   SPECREQUEST  11/05/2017 1919    BOTTLES DRAWN AEROBIC ONLY Blood Culture adequate volume   CULT  11/05/2017 1919    NO GROWTH < 12 HOURS Performed at [facility], [place] [address] [place], [place] [address]    REPTSTATUS PENDING 11/05/2017 1919   Studies/Results: No results found. Medications: .  ceFAZolin (ANCEF) IV     . ambrisentan  10 mg Oral Daily  . amiodarone  200 mg Oral QPM  . atorvastatin  40 mg Oral QPM  . calcium acetate  1,334 mg Oral TID WC   . Chlorhexidine Gluconate Cloth  6 each Topical Q0600  . cinacalcet  90 mg Oral QAC supper  . levothyroxine  300 mcg Oral QAC breakfast  . midodrine  10 mg Oral TID WC  . multivitamin  1 tablet Oral QHS  . pantoprazole  40 mg Oral BID AC  . povidone-iodine  2 application Topical Once  . sucralfate  1 g Oral BID  . tadalafil  20 mg Oral Daily    Dialysis Orders: MWF at [facility] 4hr, 450/800, EDW 124.5kg, 3K/2.25Ca, AVF + TDC, no heparin (on warfarin) - Mircera 258mcg IV q 2 weeks (last 6/17) - Venofer 100mg  x 10 ordered  Assessment/Plan: 1. R knee pain /effusion: Gram stain with >45000 WBC. On Vanc. Blood and knee fluid cx's negative. To OR today for washout after HD. 2. ESRD:cont MWF HD. HD today.  3. Hypotension/volume:Chronic low BP, on midodrine. Under EDW now - will change on d/c. 4. Anemia:Hgb 9.0 - on max ESA, not due for dose yet - follow. 5. Metabolic bone disease:Corr Ca/Phos ok, continue home sensipar and Phoslo. 6. A-fib:Warfarin on hold in case  surgery needed. Per primary 7. PAH 8.  AVF dysfunction: + RUE edema, central veins stenosed/occluded by venogram here done by [facility]. Possible HeRO per [facility] after knee issues resolve. 9. Subcutanous nodules: Unclear etiology, ?worrisome for possible early calciphylaxis/fat necrosis although looks like Ca/Phos/PTH are controlled. Does take warfarin. Rec deep biopsy of the nodules If + for calcifications, will need to change warfarin to Eliquis.   Kelly Splinter MD [employer] pager [phone]   11/06/2017, 3:09 PM

## 2017-11-06 NOTE — Consult Note (Signed)
ORTHOPAEDIC CONSULTATION  REQUESTING PHYSICIAN: Lind Covert, MD  PCP:  [name], MD  Chief Complaint: Right knee pain and swelling  HPI: Adam [name]. is a 57 y.o. male with a history of chronic knee arthritis and recurrent effusions. He has significantly worse right knee pain and swelling over the past week or so. He is mobilizing poorly with PT. His R knee aspirate from the ED on 6/24 was cloudy and showed 45,500 WBC with 90% N. Cx negative, crystals negative. Patient is immunocompromised due to ESRD on HD.  Patient had recent infected defibrillator and vascular graft with MSSA bacteremia. Given this history, I think there is a reasonable chance he has septic arthritis even though the culture was (-). Therefore, I recommend R knee arthrotomy and drainage in the OR.   Past Medical History:  Diagnosis Date  . AICD (automatic cardioverter/defibrillator) present    [employer]   . Aortic stenosis    moderate AS by 08/2016 echo  . Arthritis    "knees" (08/05/2016)  . Atrial fibrillation ([place])   . ESRD (end stage renal disease) on dialysis ([place])    "M, T, W, T, F; NX stage hemodialysis; I do it at home" (08/05/2016)  . Great toe amputation status ([place])    status post left hallux amputation 08/01/14  . History of blood transfusion 2009   "S/P biopsy for prostate cancer check"  . Hypertension   . Hypothyroidism (acquired)   . Mitral stenosis    moderate mitral stenosis  . Nonischemic cardiomyopathy ([place])   . Pneumonia 2014; 08/05/2016  . PONV (postoperative nausea and vomiting)   . Pulmonary HTN ([place])   . Renal insufficiency   . Thyroid cancer ([place]) 2011   Past Surgical History:  Procedure Laterality Date  . ARTERIAL LINE INSERTION Right 08/24/2017   Procedure: ARTERIAL LINE INSERTION INTO RIGHT FEMORAL ARTERY;  Surgeon: Conrad Leonard, MD;  Location: [place];  Service: Vascular;  Laterality: Right;  . BASCILIC VEIN TRANSPOSITION Right 10/13/2017   Procedure:  FIRST STAGE BRACHIAL VEIN TRANSPOSITION RIGHT ARM;  Surgeon: Conrad Union, MD;  Location: [name];  Service: Vascular;  Laterality: Right;  . BIOPSY  09/25/2017   Procedure: BIOPSY;  Surgeon: Ronnette Juniper, MD;  Location: [facility] ENDOSCOPY;  Service: Gastroenterology;;  . CARDIAC CATHETERIZATION Right 2017  . CARDIAC DEFIBRILLATOR PLACEMENT  2009  . DIALYSIS FISTULA CREATION Left 2003   "upper arm"  . DIALYSIS/PERMA CATHETER REMOVAL Left 09/01/2017   Procedure: DIALYSIS/PERMA CATHETER REMOVAL;  Surgeon: Conrad Nile, MD;  Location: [place];  Service: Vascular;  Laterality: Left;  . ESOPHAGOGASTRODUODENOSCOPY (EGD) WITH PROPOFOL N/A 09/25/2017   Procedure: ESOPHAGOGASTRODUODENOSCOPY (EGD) WITH PROPOFOL;  Surgeon: Ronnette Juniper, MD;  Location: [place];  Service: Gastroenterology;  Laterality: N/A;  . HERNIA REPAIR    . ICD LEAD REMOVAL Right 08/20/2017   Procedure: ICD EXTRACTION WITH C-ARM;  Surgeon: Evans Lance, MD;  Location: [facility] OR;  Service: Cardiovascular;  Laterality: Right;  DR VAN TRIGT TO BACK-UP  . icd removed    . INSERTION OF DIALYSIS CATHETER N/A 11/11/2016   Procedure: INSERTION OF TUNNELED DIALYSIS CATHETER;  Surgeon: Angelia Mould, MD;  Location: [place];  Service: Vascular;  Laterality: N/A;  . INSERTION OF DIALYSIS CATHETER Left 08/24/2017   Procedure: INSERTION OF DIALYSIS CATHETER;  Surgeon: Conrad Chester, MD;  Location: [place];  Service: Vascular;  Laterality: Left;  . INSERTION OF DIALYSIS CATHETER Left 09/01/2017   Procedure:  INSERTION OF DIALYSIS CATHETER;  Surgeon: Conrad [place], MD;  Location: [place];  Service: Vascular;  Laterality: Left;  . KIDNEY TRANSPLANT  2009  . KNEE SURGERY Bilateral 2000s   "drained fluid"  . LAPAROSCOPIC GASTRIC BANDING  2006  . PROSTATE BIOPSY  2009  . REVISON OF ARTERIOVENOUS FISTULA Left 08/18/8117   Procedure: PLICATION OF ARTERIOVENOUS FISTULA ANEURYSM;  Surgeon: Elam Dutch, MD;  Location: [place];  Service: Vascular;  Laterality: Left;   . REVISON OF ARTERIOVENOUS FISTULA Left 11/10/2016   Procedure: REVISON OF ARTERIOVENOUS FISTULA - LEFT UPPER ARM;  Surgeon: Elam Dutch, MD;  Location: [place];  Service: Vascular;  Laterality: Left;  . REVISON OF ARTERIOVENOUS FISTULA Left 08/24/2017   Procedure: REVISON OF ARTERIOVENOUS FISTULA LEFT;  Surgeon: Conrad [place], MD;  Location: [place];  Service: Vascular;  Laterality: Left;  . TEE WITHOUT CARDIOVERSION N/A 08/20/2017   Procedure: TRANSESOPHAGEAL ECHOCARDIOGRAM (TEE);  Surgeon: Evans Lance, MD;  Location: [place];  Service: Cardiovascular;  Laterality: N/A;  . THROMBECTOMY W/ EMBOLECTOMY Left 12/25/2014   Procedure: THROMBECTOMY ARTERIOVENOUS FISTULA;  Surgeon: Elam Dutch, MD;  Location: [place];  Service: Vascular;  Laterality: Left;  . THYROIDECTOMY  2011  . TOE AMPUTATION Bilateral     right 1st and  2nd digits; left 1st and 3rd digits"  . UMBILICAL HERNIA REPAIR  X 2  . UPPER EXTREMITY VENOGRAPHY Right 11/05/2017   Procedure: UPPER EXTREMITY VENOGRAPHY;  Surgeon: Conrad [place], MD;  Location: [place] [facility];  Service: Cardiovascular;  Laterality: Right;  . VEIN HARVEST Left 08/24/2017   Procedure: VEIN HARVEST;  Surgeon: Conrad [place], MD;  Location: [facility] OR;  Service: Vascular;  Laterality: Left;   Social History   Socioeconomic History  . Marital status: Married    Spouse name: Not on file  . Number of children: 1  . Years of education: Not on file  . Highest education level: Not on file  Occupational History  . Not on file  Social Needs  . Financial resource strain: Not on file  . Food insecurity:    Worry: Not on file    Inability: Not on file  . Transportation needs:    Medical: Not on file    Non-medical: Not on file  Tobacco Use  . Smoking status: Never Smoker  . Smokeless tobacco: Never Used  Substance and Sexual Activity  . Alcohol use: Yes    Alcohol/week: 0.0 oz    Comment: social  . Drug use: No  . Sexual activity: Yes  Lifestyle  .  Physical activity:    Days per week: Not on file    Minutes per session: Not on file  . Stress: Not on file  Relationships  . Social connections:    Talks on phone: Not on file    Gets together: Not on file    Attends religious service: Not on file    Active member of club or organization: Not on file    Attends meetings of clubs or organizations: Not on file    Relationship status: Not on file  Other Topics Concern  . Not on file  Social History Narrative   Retired Archivist.     Family History  Problem Relation Age of Onset  . Heart failure Mother   . Hypertension Mother   . CAD Mother 31  . Emphysema Mother   . Hypertension Father   . Kidney failure Father  Allergies  Allergen Reactions  . Enalaprilat Swelling and Other (See Comments)    Mouth swelling  . Vasotec [Enalapril] Swelling and Other (See Comments)    MOUTH SWELLING  . Iodinated Diagnostic Agents Nausea And Vomiting and Nausea Only   Prior to Admission medications   Medication Sig Start Date End Date Taking? Authorizing Provider  ambrisentan (LETAIRIS) 10 MG tablet Take 1 tablet (10 mg total) by mouth daily. 09/11/17  Yes Juanito Doom, MD  amiodarone (PACERONE) 200 MG tablet Take 200 mg by mouth every evening.    Yes [provider]  atorvastatin (LIPITOR) 40 MG tablet Take 1 tablet (40 mg total) by mouth every evening. 06/15/17  Yes Diallo, Abdoulaye, MD  cinacalcet (SENSIPAR) 30 MG tablet Take 3 tablets (90 mg total) by mouth every evening. 09/26/17  Yes Enid Derry, [place], DO  levothyroxine (SYNTHROID, LEVOTHROID) 300 MCG tablet Take 1 tablet (300 mcg total) by mouth daily before breakfast. 09/26/17  Yes Enid Derry, [place], DO  midodrine (PROAMATINE) 10 MG tablet TAKE 1 TABLET (10 MG TOTAL) BY MOUTH 3 (THREE) TIMES DAILY. 10/08/17  Yes Minus Breeding, MD  multivitamin (RENA-VIT) TABS tablet TAKE 1 TABLET BY MOUTH EVERYDAY AT BEDTIME 10/26/17  Yes Diallo, Abdoulaye, MD  pantoprazole  (PROTONIX) 40 MG tablet Take 1 tablet (40 mg total) by mouth 2 (two) times daily before a meal. 09/26/17  Yes Enid Derry, [place], DO  sucralfate (CARAFATE) 1 g tablet TAKE 1 TABLET (1 G TOTAL) BY MOUTH 2 (TWO) TIMES DAILY. 10/26/17  Yes Diallo, Abdoulaye, MD  tadalafil (ADCIRCA/CIALIS) 20 MG tablet Take 20 mg by mouth daily.  10/06/17  Yes [provider]  warfarin (COUMADIN) 6 MG tablet TAKE 1 TO 1.5 TABLETS BY MOUTH DAILY AS DIRECTED BY COUMADIN CLINIC Patient taking differently: Take 6 mg by mouth daily on Sunday. Take 9 mg by mouth daily on all other days. 10/12/17  Yes Croitoru, Mihai, MD  calcium acetate (PHOSLO) 667 MG capsule Take 2 capsules (1,334 mg total) by mouth 3 (three) times daily with meals. Patient not taking: Reported on 11/02/2017 09/26/17   Shirley, [place], DO   No results found.  Positive ROS: All other systems have been reviewed and were otherwise negative with the exception of those mentioned in the HPI and as above.  Physical Exam: General: Alert, no acute distress Cardiovascular: No pedal edema Respiratory: No cyanosis, no use of accessory musculature GI: No organomegaly, abdomen is soft and non-tender Skin: No lesions in the area of chief complaint Neurologic: Sensation intact distally Psychiatric: Patient is competent for consent with normal mood and affect Lymphatic: No axillary or cervical lymphadenopathy  MUSCULOSKELETAL: Examination of the right knee reveals a large effusion.  I do not appreciate any erythema.  He has significantly limited range of motion secondary to pain.  Examination left knee reveals a moderate effusion.  No significant pain with range of motion.  Neurovascular intact distally.  Assessment: Probable septic arthritis right knee  Plan: I discussed the findings with the patient.  I recommended right knee arthrotomy and drainage.  After surgery, we will have infectious disease make recommendations regarding antibiotic selection and  duration for septic arthritis.  We will mobilize him with physical therapy.  We will plan for discharge when he is medically stable.    Bertram Savin, MD Cell [phone]    11/06/2017 3:40 PM

## 2017-11-06 NOTE — Progress Notes (Signed)
Physical Therapy Treatment Patient Details Name: Adam [name]. MRN: [id number] DOB: [birth year] Today's Date: 11/06/2017    History of Present Illness Pt is a 57 y/o male admitted secondary to R knee pain. Suspected R knee septic arthritis. Pt also s/p UE venography which showed R central vein ocllusion. PMH includes ESRD on HD MWF, a fib, HTN, and s/p cardiac defibrillator.     PT Comments    Patient leaving for OR shortly for I&D, but requested PT show bed level strengthening exercises for right lower extremity. Patient continues with significant pain along knee joint, but is very motivated to participate. Per PT note from 6/27, requiring mod-maxA for basic transfers and side stepping using a walker, whereas patient was independent with mobility/ADL's prior to admission. Feel that patient would be a good candidate for CIR based on PLOF, motivation, age, and family support. Will continue to reassess discharge plan and patient status following surgery.   Follow Up Recommendations  CIR     Equipment Recommendations  Rolling walker with 5" wheels    Recommendations for Other Services       Precautions / Restrictions Precautions Precautions: Fall Restrictions Weight Bearing Restrictions: No    Mobility  Bed Mobility               General bed mobility comments: Not assessed  Transfers                 General transfer comment: Not assessed  Ambulation/Gait                 Stairs             Wheelchair Mobility    Modified Rankin (Stroke Patients Only)       Balance                                            Cognition Arousal/Alertness: Awake/alert Behavior During Therapy: WFL for tasks assessed/performed Overall Cognitive Status: Within Functional Limits for tasks assessed                                        Exercises General Exercises - Lower Extremity Quad Sets: 10  reps;Right;Supine Short Arc Quad: 10 reps;Right;Supine Heel Slides: 10 reps;Right;Supine Hip ABduction/ADduction: 10 reps;Right;Supine Straight Leg Raises: 10 reps;Right;Supine    General Comments        Pertinent Vitals/Pain Pain Assessment: Faces Faces Pain Scale: Hurts whole lot Pain Location: R knee joint Pain Descriptors / Indicators: Sharp Pain Intervention(s): Monitored during session;Limited activity within patient's tolerance    Home Living                      Prior Function            PT Goals (current goals can now be found in the care plan section) Acute Rehab PT Goals Patient Stated Goal: to decrease R knee pain  PT Goal Formulation: With patient Time For Goal Achievement: 11/19/17 Potential to Achieve Goals: Good    Frequency    Min 3X/week      PT Plan Discharge plan needs to be updated    Co-evaluation              AM-PAC PT "6 Clicks" Daily Activity  Outcome Measure  Difficulty turning over in bed (including adjusting bedclothes, sheets and blankets)?: A Little Difficulty moving from lying on back to sitting on the side of the bed? : A Little Difficulty sitting down on and standing up from a chair with arms (e.g., wheelchair, bedside commode, etc,.)?: Unable Help needed moving to and from a bed to chair (including a wheelchair)?: A Lot Help needed walking in hospital room?: A Lot Help needed climbing 3-5 steps with a railing? : Total 6 Click Score: 12    End of Session   Activity Tolerance: Patient limited by pain Patient left: in bed;with call bell/phone within reach Nurse Communication: Mobility status PT Visit Diagnosis: Unsteadiness on feet (R26.81);Other abnormalities of gait and mobility (R26.89);Pain Pain - Right/Left: Right Pain - part of body: Knee     Time: 1324-1340 PT Time Calculation (min) (ACUTE ONLY): 16 min  Charges:  $Therapeutic Exercise: 8-22 mins                    G Codes:      [name],  PT, DPT Acute Rehabilitation Services  Pager: [phone]    [name] 11/06/2017, 2:00 PM

## 2017-11-06 NOTE — Progress Notes (Addendum)
OT Cancellation Note  Patient Details Name: Adam [name]. MRN: [id number] DOB: 1960-06-13   Cancelled Treatment:    Reason Eval/Treat Not Completed: Patient at procedure or test/ unavailable;Other (comment). Attempted x 2 to see pt today. Pt at HD in a.m. Pt getting bath in p.m. Prepping for surgery this afternoon per RN. Germaine Pomfret try back tomorrow  Britt Bottom 11/06/2017, 12:48 PM

## 2017-11-06 NOTE — Consult Note (Signed)
[facility]    Date of Admission:  11/02/2017     Total days of antibiotics                Reason for Consult: Septic Joint Infection   Referring Provider: Erin Hearing  Primary Care Provider: [name], MD   Assessment/Plan:  Mr. Hy is a 57 y/o male with previous medical history of knee effusions, ESRD on dialysis, Afib on coumadin, NICM s/p defibrillator removal in April 2019 and MSSA bacteremia and fistula infection presenting with new onset right knee pain concerning for septic arthritis of the right knee. Synovial fluid cultures remain pending with WBC count of 45,500 and 90% neutrophils. He has been afebrile with blood cultures obtained with no growth. Currently off antibiotics and awaiting I&D with cultures in the OR today by Dr. Delfino Lovett.  1. Continue to monitor off antibiotics pending surgical cultures.  2. Recommend vancomycin and ceftriaxone following the procedure if antibiotics are indicated.  Dr. Tommy Medal will follow over the weekend.   Principal Problem:   Septic arthritis of knee, right (HCC) Active Problems:   Atrial fibrillation [I48.91]   Pulmonary hypertension (HCC)   ESRD (end stage renal disease) on hemodialysis   Infected defibrillator [facility]   Dialysis AV fistula infection, sequela   Malnutrition of moderate degree   . ambrisentan  10 mg Oral Daily  . amiodarone  200 mg Oral QPM  . atorvastatin  40 mg Oral QPM  . calcium acetate  1,334 mg Oral TID WC  . chlorhexidine  60 mL Topical Once  . Chlorhexidine Gluconate Cloth  6 each Topical Q0600  . cinacalcet  90 mg Oral QAC supper  . levothyroxine  300 mcg Oral QAC breakfast  . midodrine  10 mg Oral TID WC  . multivitamin  1 tablet Oral QHS  . pantoprazole  40 mg Oral BID AC  . povidone-iodine  2 application Topical Once  . sucralfate  1 g Oral BID  . tadalafil  20 mg Oral Daily     HPI: Severn Goddard. is a 57 y.o. male with a previous medical history of  ESRD on dialysis, Afib on coumadin, NICM s/p defibrillator removal in April 2019, and MSSA bacteremia and fistula infection who presents with right knee pain and swelling going on for a couple of weeks. Knee was aspirated in the ED with WBC count of 45,500, 90% neutrophils, and no crystals. Cultures remain negative. Blood cultures are negative.  He does have history of knee effusions.   He has been afebrile with no leukocytosis since admission. Initially started on vancomycin which has since been held as he is scheduled to undergo I&D today.    Review of Systems: Review of Systems  Constitutional: Negative for chills, diaphoresis and fever.  Respiratory: Negative for cough and shortness of breath.   Cardiovascular: Negative for chest pain and leg swelling.  Gastrointestinal: Negative for abdominal pain, constipation, diarrhea, nausea and vomiting.  Musculoskeletal:       Positive for right knee pain.      Past Medical History:  Diagnosis Date  . AICD (automatic cardioverter/defibrillator) present    [employer]   . Aortic stenosis    moderate AS by 08/2016 echo  . Arthritis    "knees" (08/05/2016)  . Atrial fibrillation ([place])   . ESRD (end stage renal disease) on dialysis ([place])    "M, T, W, T, F; NX stage hemodialysis; I do it at home" (  08/05/2016)  . Great toe amputation status ([place])    status post left hallux amputation 08/01/14  . History of blood transfusion 2009   "S/P biopsy for prostate cancer check"  . Hypertension   . Hypothyroidism (acquired)   . Mitral stenosis    moderate mitral stenosis  . Nonischemic cardiomyopathy ([place])   . Pneumonia 2014; 08/05/2016  . PONV (postoperative nausea and vomiting)   . Pulmonary HTN ([place])   . Renal insufficiency   . Thyroid cancer ([place]) 2011    Social History   Tobacco Use  . Smoking status: Never Smoker  . Smokeless tobacco: Never Used  Substance Use Topics  . Alcohol use: Yes    Alcohol/week: 0.0 oz    Comment: social  .  Drug use: No    Family History  Problem Relation Age of Onset  . Heart failure Mother   . Hypertension Mother   . CAD Mother 59  . Emphysema Mother   . Hypertension Father   . Kidney failure Father     Allergies  Allergen Reactions  . Enalaprilat Swelling and Other (See Comments)    Mouth swelling  . Vasotec [Enalapril] Swelling and Other (See Comments)    MOUTH SWELLING  . Iodinated Diagnostic Agents Nausea And Vomiting and Nausea Only    OBJECTIVE: Blood pressure (!) 79/60, pulse 78, temperature (!) 97.4 F (36.3 C), temperature source Oral, resp. rate 16, height 6\' 1"  (1.854 m), weight 269 lb 6.4 oz (122.2 kg), SpO2 97 %.  Physical Exam  Constitutional: He is oriented to person, place, and time. He appears well-developed and well-nourished. No distress.  Sitting on the side of the bed. Pleasant.   Cardiovascular: Normal rate, regular rhythm and intact distal pulses.  Murmur heard. Pulmonary/Chest: Effort normal and breath sounds normal. No stridor. No respiratory distress. He has no wheezes. He has no rales. He exhibits no tenderness.  Musculoskeletal:  Right knee with no obvious deformity or discoloration. There is moderate amount of edema and generalized mild tenderness. Distal pulses are intact and appropriate.   Neurological: He is alert and oriented to person, place, and time.  Skin: Skin is warm and dry.  Psychiatric: He has a normal mood and affect.    Lab Results Lab Results  Component Value Date   WBC 6.3 11/06/2017   HGB 9.3 (L) 11/06/2017   HCT 30.4 (L) 11/06/2017   MCV 91.3 11/06/2017   PLT 143 (L) 11/06/2017    Lab Results  Component Value Date   CREATININE 6.78 (H) 11/06/2017   BUN 21 (H) 11/06/2017   NA 134 (L) 11/06/2017   K 3.5 11/06/2017   CL 97 (L) 11/06/2017   CO2 26 11/06/2017    Lab Results  Component Value Date   ALT <5 (L) 09/23/2017   AST 18 09/23/2017   ALKPHOS 107 09/23/2017   BILITOT 0.5 09/23/2017      Microbiology: Recent Results (from the past 240 hour(s))  Clostridium Difficile by PCR([facility])     Status: None   Collection Time: 10/30/17 12:03 PM  Result Value Ref Range Status   Toxigenic C. Difficile by PCR Negative Negative Final  Body fluid culture     Status: None   Collection Time: 11/02/17  8:58 PM  Result Value Ref Range Status   Specimen Description   Final    KNEE RIGHT Performed at [facility] [address] [place], [place] [address]    Special Requests   Final  Normal Performed at [facility], [place] [address], [place], [place] [address]    Gram Stain   Final    MODERATE WBC PRESENT, PREDOMINANTLY PMN NO ORGANISMS SEEN Gram Stain Report Called to,Read Back By and Verified With: JOHNSON,L RN AT 1540 11/02/17 BY TIBBITTS,K Performed at [facility], [place] [address] [place], [place] [address]    Culture   Final    NO GROWTH 3 DAYS Performed at [facility], [place] [address] [place], [place] [address]    Report Status 11/06/2017 FINAL  Final  Culture, blood (routine x 2)     Status: None (Preliminary result)   Collection Time: 11/03/17 12:32 AM  Result Value Ref Range Status   Specimen Description   Final    BLOOD LEFT FOREARM Performed at [facility] [address], [place], [place] [address]    Special Requests   Final    BOTTLES DRAWN AEROBIC AND ANAEROBIC Blood Culture adequate volume Performed at [facility] [address] [place], [place] [address]    Culture   Final    NO GROWTH 3 DAYS Performed at [facility], [place] [address], [place], [place] [address]    Report Status PENDING  Incomplete  MRSA PCR Screening     Status: None   Collection Time: 11/03/17  6:45 AM  Result Value Ref Range Status   MRSA by PCR NEGATIVE NEGATIVE Final    Comment:        The GeneXpert MRSA Assay (FDA approved for NASAL specimens only), is one  component of a comprehensive MRSA colonization surveillance program. It is not intended to diagnose MRSA infection nor to guide or monitor treatment for MRSA infections. Performed at [facility], [place] [address], [place], [place] [address]   Culture, blood (routine x 2)     Status: None (Preliminary result)   Collection Time: 11/05/17  7:17 PM  Result Value Ref Range Status   Specimen Description BLOOD LEFT HAND  Final   Special Requests   Final    BOTTLES DRAWN AEROBIC AND ANAEROBIC Blood Culture adequate volume   Culture   Final    NO GROWTH < 12 HOURS Performed at [facility], [place] [address] [place], [place] [address]    Report Status PENDING  Incomplete  Culture, blood (routine x 2)     Status: None (Preliminary result)   Collection Time: 11/05/17  7:19 PM  Result Value Ref Range Status   Specimen Description BLOOD LEFT FOREARM  Final   Special Requests   Final    BOTTLES DRAWN AEROBIC ONLY Blood Culture adequate volume   Culture   Final    NO GROWTH < 12 HOURS Performed at [facility], [place] [address] [place],  [address]    Report Status PENDING  Incomplete     Terri Piedra, [place] [facility] [place] Pager  11/06/2017  12:26 PM

## 2017-11-06 NOTE — Anesthesia Preprocedure Evaluation (Addendum)
Anesthesia Evaluation  Patient identified by MRN, date of birth, ID band Patient awake    Reviewed: Allergy & Precautions, NPO status , Patient's Chart, lab work & pertinent test results  History of Anesthesia Complications (+) PONV and DIFFICULT IV STICK / SPECIAL LINE  Airway Mallampati: I  TM Distance: >3 FB Neck ROM: Full    Dental  (+) Edentulous Upper, Dental Advisory Given   Pulmonary neg pulmonary ROS,    breath sounds clear to auscultation       Cardiovascular hypertension, Pt. on medications + Peripheral Vascular Disease and +CHF (pulmonary HTN)  + dysrhythmias Atrial Fibrillation + Cardiac Defibrillator  Rhythm:Regular Rate:Normal + Systolic murmurs 0/73 ECHO: EF 60-65%, mod AS with peak grad 40 mmHg, mean grad 25 mmHg, mod MS with peak grad 9 mmHg, mod TR, pulm HTN with PA pressure 62mm Hg   Neuro/Psych negative neurological ROS     GI/Hepatic GERD  Medicated,  Endo/Other  Hypothyroidism Obesity  Renal/GU ESRF and DialysisRenal diseaseS/p renal transplant     Musculoskeletal  (+) Arthritis ,   Abdominal (+) + obese,   Peds  Hematology  (+) anemia , coumadin   Anesthesia Other Findings   Reproductive/Obstetrics                          Lab Results  Component Value Date   WBC 6.3 11/06/2017   HGB 9.3 (L) 11/06/2017   HCT 30.4 (L) 11/06/2017   MCV 91.3 11/06/2017   PLT 143 (L) 11/06/2017   Lab Results  Component Value Date   CREATININE 6.78 (H) 11/06/2017   BUN 21 (H) 11/06/2017   NA 134 (L) 11/06/2017   K 3.5 11/06/2017   CL 97 (L) 11/06/2017   CO2 26 11/06/2017   Lab Results  Component Value Date   INR 2.19 11/06/2017   INR 1.85 11/05/2017   INR 2.05 11/02/2017   Echo: - Left ventricle: The cavity size was normal. Wall thickness was   increased in a pattern of mild LVH. Systolic function was normal.   The estimated ejection fraction was in the range of 60% to  65%.   Wall motion was normal; there were no regional wall motion   abnormalities. Doppler parameters are consistent with abnormal   left ventricular relaxation (grade 1 diastolic dysfunction). The   E/e&' ratio is between 8-15, suggesting indeterminate LV filling   pressure. - Ventricular septum: Septal motion showed abnormal function and dyssynergy. The contour showed diastolic flattening and systolic flattening. - Aortic valve: Moderate calcified aortic stenosis. Mean gradient (S): 25 mm Hg. Peak gradient (S): 40 mm Hg. Valve area (VTI): 1.15 cm^2. Valve area (Vmax): 1.11 cm^2. Valve area (Vmean): 1.16   cm^2. - Mitral valve: Calcified annulus. Mildly thickened leaflets . Mild to moderate stenosis. Mean gradient (D): 8 mm Hg. Valve area by continuity equation (using LVOT flow): 1.25 cm^2. - Left atrium: The atrium was mildly dilated. - Right ventricle: The cavity size was moderately dilated. - Right atrium: Severely dilated. - Tricuspid valve: There was moderate regurgitation. - Pulmonary arteries: PA peak pressure: 59 mm Hg (S). - Inferior vena cava: The vessel was dilated. The respirophasic   diameter changes were blunted (< 50%), consistent with elevated   central venous pressure.   Anesthesia Physical  Anesthesia Plan  ASA: III  Anesthesia Plan: General   Post-op Pain Management:    Induction: Intravenous  PONV Risk Score and Plan: 2 and Ondansetron and  Midazolam  Airway Management Planned: LMA  Additional Equipment: None  Intra-op Plan:   Post-operative Plan: Extubation in OR  Informed Consent: I have reviewed the patients History and Physical, chart, labs and discussed the procedure including the risks, benefits and alternatives for the proposed anesthesia with the patient or authorized representative who has indicated his/her understanding and acceptance.   Dental advisory given  Plan Discussed with: CRNA and Anesthesiologist  Anesthesia Plan Comments:         Anesthesia Quick Evaluation

## 2017-11-07 ENCOUNTER — Other Ambulatory Visit: Payer: Self-pay

## 2017-11-07 ENCOUNTER — Inpatient Hospital Stay (HOSPITAL_COMMUNITY): Payer: Medicare HMO

## 2017-11-07 DIAGNOSIS — N186 End stage renal disease: Secondary | ICD-10-CM

## 2017-11-07 DIAGNOSIS — M25561 Pain in right knee: Secondary | ICD-10-CM

## 2017-11-07 DIAGNOSIS — L0291 Cutaneous abscess, unspecified: Secondary | ICD-10-CM

## 2017-11-07 DIAGNOSIS — M009 Pyogenic arthritis, unspecified: Principal | ICD-10-CM

## 2017-11-07 DIAGNOSIS — T827XXS Infection and inflammatory reaction due to other cardiac and vascular devices, implants and grafts, sequela: Secondary | ICD-10-CM

## 2017-11-07 LAB — CBC
HCT: 30.6 % — ABNORMAL LOW (ref 39.0–52.0)
Hemoglobin: 9 g/dL — ABNORMAL LOW (ref 13.0–17.0)
MCH: 27.5 pg (ref 26.0–34.0)
MCHC: 29.4 g/dL — AB (ref 30.0–36.0)
MCV: 93.6 fL (ref 78.0–100.0)
Platelets: 127 10*3/uL — ABNORMAL LOW (ref 150–400)
RBC: 3.27 MIL/uL — ABNORMAL LOW (ref 4.22–5.81)
RDW: 18.6 % — AB (ref 11.5–15.5)
WBC: 5.1 10*3/uL (ref 4.0–10.5)

## 2017-11-07 LAB — BASIC METABOLIC PANEL
Anion gap: 13 (ref 5–15)
BUN: 13 mg/dL (ref 6–20)
CALCIUM: 8.1 mg/dL — AB (ref 8.9–10.3)
CO2: 28 mmol/L (ref 22–32)
CREATININE: 5.03 mg/dL — AB (ref 0.61–1.24)
Chloride: 96 mmol/L — ABNORMAL LOW (ref 98–111)
GFR calc non Af Amer: 12 mL/min — ABNORMAL LOW (ref >60)
GFR, EST AFRICAN AMERICAN: 14 mL/min — AB (ref >60)
Glucose, Bld: 83 mg/dL (ref 70–99)
Potassium: 3.3 mmol/L — ABNORMAL LOW (ref 3.5–5.1)
SODIUM: 137 mmol/L (ref 135–145)

## 2017-11-07 LAB — SURGICAL PCR SCREEN
MRSA, PCR: NEGATIVE
STAPHYLOCOCCUS AUREUS: NEGATIVE

## 2017-11-07 LAB — PROTIME-INR
INR: 2.26
Prothrombin Time: 24.8 seconds — ABNORMAL HIGH (ref 11.4–15.2)

## 2017-11-07 NOTE — Anesthesia Postprocedure Evaluation (Signed)
Anesthesia Post Note  Patient: Adam [name].  Procedure(s) Performed: IRRIGATION AND DEBRIDEMENT KNEE (Right Knee)     Patient location during evaluation: Other Anesthesia Type: General Level of consciousness: awake and alert Pain management: pain level controlled Vital Signs Assessment: post-procedure vital signs reviewed and stable Respiratory status: spontaneous breathing, nonlabored ventilation, respiratory function stable and patient connected to nasal cannula oxygen Cardiovascular status: blood pressure returned to baseline and stable Postop Assessment: no apparent nausea or vomiting Anesthetic complications: no    Last Vitals:  Vitals:   11/07/17 0500 11/07/17 0951  BP: (!) 79/53 (!) 76/52  Pulse: 67 71  Resp: 14 16  Temp: (!) 36.4 C 36.7 C  SpO2: 94% 93%    Last Pain:  Vitals:   11/07/17 0951  TempSrc: Oral  PainSc:                  Effie Berkshire

## 2017-11-07 NOTE — Progress Notes (Signed)
[facility] Progress Note   Subjective:  Seen on HD. No new c/o.   Objective Vitals:   11/06/17 1852 11/07/17 0054 11/07/17 0500 11/07/17 0951  BP: (!) 95/53 (!) 86/60 (!) 79/53 (!) 76/52  Pulse: 64 68 67 71  Resp: 16 14 14 16   Temp: (!) 97.3 F (36.3 C) (!) 97.4 F (36.3 C) (!) 97.5 F (36.4 C) 98 F (36.7 C)  TempSrc: Oral Oral Oral Oral  SpO2: 95% 100% 94% 93%  Weight:      Height:       Physical Exam General: Well appearing, NAD Skin: 3 ill defined tender nodules without subcutaneous fat (2 in L flank, 1 R flank/ chest area) Heart: RRR; no murmur Lungs: CTAB Abdomen: soft, non-tender Extremities: Swollen R knee, tense/woody edema in B lower legs. Dialysis Access: TDC in L chest, new R AVG (10/13/17) + edema/ + bruit  Additional Objective Labs: Basic Metabolic Panel: Recent Labs  Lab 11/04/17 0505 11/05/17 0518 11/06/17 0500 11/07/17 0803  NA 136 136 134* 137  K 3.5 3.5 3.5 3.3*  CL 98 99 97* 96*  CO2 25 27 26 28   GLUCOSE 55* 71 126* 83  BUN 19 13 21* 13  CREATININE 7.02* 5.35* 6.78* 5.03*  CALCIUM 8.0* 7.6* 8.1* 8.1*  PHOS 3.3  --  2.7  --    Liver Function Tests: Recent Labs  Lab 11/04/17 0505 11/06/17 0500  ALBUMIN 2.8* 3.0*   CBC: Recent Labs  Lab 11/03/17 0031 11/04/17 0505 11/05/17 2013 11/06/17 0716 11/07/17 0803  WBC 4.9 4.5 5.8 6.3 5.1  NEUTROABS 4.1  --   --   --   --   HGB 9.6* 9.0* 10.0* 9.3* 9.0*  HCT 31.4* 29.6* 33.1* 30.4* 30.6*  MCV 92.9 92.5 92.7 91.3 93.6  PLT 115* 95* 159 143* 127*   Blood Culture    Component Value Date/Time   SDES TISSUE 11/06/2017 1716   SPECREQUEST NONE 11/06/2017 1716   CULT  11/06/2017 1716    NO GROWTH < 24 HOURS Performed at [facility], [place] [address] [place], [place] [address]    REPTSTATUS PENDING 11/06/2017 1716   Studies/Results: Dg Chest 1 View  Result Date: 11/07/2017 CLINICAL DATA:  Pre-op chest exam for fistula EXAM: CHEST  1 VIEW COMPARISON:  09/24/2017  FINDINGS: Lungs are clear. Improvement in pulmonary vascular congestion. Stable tunneled left IJ hemodialysis catheter to the proximal right atrium. Left axillary vascular stents as before. Stable cardiomegaly.  Aortic Atherosclerosis (ICD10-170.0). No effusion. Visualized bones unremarkable. IMPRESSION: Stable cardiomegaly.  No acute findings. Electronically Signed   By: Lucrezia Europe M.D.   On: 11/07/2017 08:22   Medications: . sodium chloride 10 mL/hr at 11/06/17 1548  . sodium chloride    . cefTRIAXone (ROCEPHIN)  IV Stopped (11/06/17 2117)  . vancomycin Stopped (11/06/17 2150)   . ambrisentan  10 mg Oral Daily  . amiodarone  200 mg Oral QPM  . atorvastatin  40 mg Oral QPM  . calcium acetate  1,334 mg Oral TID WC  . Chlorhexidine Gluconate Cloth  6 each Topical Q0600  . cinacalcet  90 mg Oral QAC supper  . docusate sodium  100 mg Oral BID  . levothyroxine  300 mcg Oral QAC breakfast  . midodrine  10 mg Oral TID WC  . multivitamin  1 tablet Oral QHS  . pantoprazole  40 mg Oral BID AC  . sucralfate  1 g Oral BID  . tadalafil  20 mg Oral Daily    Dialysis Orders: MWF at [facility] 4hr, 450/800, EDW 124.5kg, 3K/2.25Ca, AVF + TDC, no heparin (on warfarin) - Mircera 262mcg IV q 2 weeks (last 6/17) - Venofer 100mg  x 10 ordered  Assessment/Plan: 1. R knee pain /effusion: Gram stain with >45000 WBC. On Vanc. Blood and knee fluid cx's negative. SP washout per orthopedics on 11/06/17.  2. ESRD:cont MWF HD. HD Monday, using TDC.  3. Hypotension/volume:Chronic low BP, on midodrine. Under EDW now - will change on d/c. 4. Anemia:Hgb 9.0 - on max ESA, not due for dose yet - follow. 5. Metabolic bone disease:Corr Ca/Phos ok, continue home sensipar and Phoslo. 6. A-fib:Warfarin on hold in case surgery needed. Per primary 7. PAH 8.  AVF dysfunction: + RUE edema, central veins stenosed/occluded by venogram here done by [facility]. AVF unalbe to salvage.  Using [facility].  Possible HeRO candidate per  [facility] 9. 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 Also takes warfarin. Rec deep biopsy of the nodules If + for calcifications, will need to change warfarin to Eliquis amongst various other changes   Kelly Splinter MD [employer] pager [phone]   11/07/2017, 1:54 PM

## 2017-11-07 NOTE — Progress Notes (Signed)
Clarified with Dr. Trula Slade about pt NPO status and question re: pt procedure today, Dr. Trula Slade said no procedure today and pt may resume his diet.

## 2017-11-07 NOTE — Progress Notes (Signed)
Subjective: 1 Day Post-Op Procedure(s) (LRB): IRRIGATION AND DEBRIDEMENT KNEE (Right) Patient reports pain as better than before surgery.   Objective: Vital signs in last 24 hours: Temp:  [97.3 F (36.3 C)-98.3 F (36.8 C)] 98 F (36.7 C) (06/29 0951) Pulse Rate:  [64-79] 71 (06/29 0951) Resp:  [14-18] 16 (06/29 0951) BP: (76-96)/(48-60) 76/52 (06/29 0951) SpO2:  [93 %-100 %] 93 % (06/29 0951) Weight:  [122.2 kg (269 lb 6.4 oz)] 122.2 kg (269 lb 6.4 oz) (06/28 1100)  Intake/Output from previous day: 06/28 0701 - 06/29 0700 In: 928.3 [P.O.:120; I.V.:512; IV Piggyback:296.3] Out: 1395 [Drains:40] Intake/Output this shift: No intake/output data recorded.  Recent Labs    11/05/17 2013 11/06/17 0716 11/07/17 0803  HGB 10.0* 9.3* 9.0*   Recent Labs    11/06/17 0716 11/07/17 0803  WBC 6.3 5.1  RBC 3.33* 3.27*  HCT 30.4* 30.6*  PLT 143* 127*   Recent Labs    11/06/17 0500 11/07/17 0803  NA 134* 137  K 3.5 3.3*  CL 97* 96*  CO2 26 28  BUN 21* 13  CREATININE 6.78* 5.03*  GLUCOSE 126* 83  CALCIUM 8.1* 8.1*   Recent Labs    11/06/17 2207 11/07/17 0803  INR 2.19 2.26   Awake and alert comfortable. Dressing clean and dry. Hemovac patent.circ ok.    Assessment/Plan: 1 Day Post-Op Procedure(s) (LRB): IRRIGATION AND DEBRIDEMENT KNEE (Right) Please see Op note. Plan well expressed. Will pull drain tomorrow. Follow cultures.  Briant Angelillo ANDREW 11/07/2017, 10:29 AM

## 2017-11-07 NOTE — Progress Notes (Signed)
MD was notified about low BP and prolong QTC. No new orders received. Continue to monitor the patient.

## 2017-11-07 NOTE — Evaluation (Addendum)
Physical Therapy Re-Evaluation Patient Details Name: Adam [name]. MRN: [id number] DOB: 1960-05-13 Today's Date: 11/07/2017   History of Present Illness  Pt is a 57 y.o. male admitted 11/02/17 with c/o R knee pain; suspected R knee septic arthritis. Now s/p R knee arthrotomy and drainage 6/28. 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 PMH includes ESRD (HD MWF), a-fib, HTN, cardiac debibrillator, subcutaneous nodules.    Clinical Impression  Pt seen today for re-evaluation now s/p R knee arthrotomy and drainage on 11/06/17. Pt reports 7/10 pain in R knee. Able to transfer and ambulate 150' with RW and min guard for balance. Educ on precautions, positioning, therex, and importance of mobility. Overall, pt moving well compared to prior to sx. Recommend continued acute PT services to maximize functional mobility and independence prior to d/c with HHPT services.    Follow Up Recommendations Home health PT;Supervision - Intermittent    Equipment Recommendations  Rolling walker with 5" wheels;3in1 (PT)    Recommendations for Other Services       Precautions / Restrictions Precautions Precautions: Fall Precaution Comments: R knee hemovac drain Restrictions Weight Bearing Restrictions: Yes RLE Weight Bearing: Weight bearing as tolerated      Mobility  Bed Mobility Overal bed mobility: Modified Independent             General bed mobility comments: Increased time and effort, but no physical assist required  Transfers Overall transfer level: Needs assistance Equipment used: Rolling walker (2 wheeled) Transfers: Sit to/from Stand Sit to Stand: Min guard;From elevated surface         General transfer comment: Cues for correct hand placement; min guard for balance  Ambulation/Gait Ambulation/Gait assistance: Min guard Gait Distance (Feet): 150 Feet Assistive device: Rolling walker (2 wheeled) Gait Pattern/deviations: Step-through pattern;Decreased  stride length;Decreased weight shift to right Gait velocity: Decreased Gait velocity interpretation: 1.31 - 2.62 ft/sec, indicative of limited community ambulator General Gait Details: Slow, antalgic amb with RW and intermittent min guard for balance; initial heavy relience on BUEs, but able to increase RLE WBAT with cues  Stairs            Wheelchair Mobility    Modified Rankin (Stroke Patients Only)       Balance Overall balance assessment: Needs assistance Sitting-balance support: No upper extremity supported;Feet supported Sitting balance-Leahy Scale: Good     Standing balance support: Bilateral upper extremity supported;During functional activity Standing balance-Leahy Scale: Poor Standing balance comment: Reliant on BUE support                             Pertinent Vitals/Pain Pain Assessment: 0-10 Pain Score: 7  Pain Location: R knee Pain Descriptors / Indicators: Sore;Discomfort Pain Intervention(s): Monitored during session;Limited activity within patient's tolerance    Home Living Family/patient expects to be discharged to:: Private residence Living Arrangements: Spouse/significant other Available Help at Discharge: Family;Available PRN/intermittently Type of Home: House Home Access: Stairs to enter Entrance Stairs-Rails: None Entrance Stairs-Number of Steps: 1 threshold Home Layout: Two level Home Equipment: None      Prior Function Level of Independence: Independent               Hand Dominance   Dominant Hand: Right    Extremity/Trunk Assessment   Upper Extremity Assessment Upper Extremity Assessment: Overall WFL for tasks assessed    Lower Extremity Assessment Lower Extremity Assessment: RLE deficits/detail RLE Deficits / Details: s/p R knee  irrigation; at least 3/5 throughout RLE: Unable to fully assess due to immobilization    Cervical / Trunk Assessment Cervical / Trunk Assessment: Normal  Communication    Communication: No difficulties  Cognition Arousal/Alertness: Awake/alert Behavior During Therapy: WFL for tasks assessed/performed Overall Cognitive Status: Within Functional Limits for tasks assessed                                        General Comments      Exercises General Exercises - Lower Extremity Long Arc Quad: AROM;Right;10 reps;Seated Hip Flexion/Marching: AROM;Right;5 reps;Seated   Assessment/Plan    PT Assessment Patient needs continued PT services  PT Problem List Decreased strength;Decreased range of motion;Decreased activity tolerance;Decreased balance;Decreased mobility;Decreased knowledge of use of DME;Pain;Decreased knowledge of precautions       PT Treatment Interventions DME instruction;Gait training;Stair training;Functional mobility training;Therapeutic activities;Therapeutic exercise;Balance training;Patient/family education    PT Goals (Current goals can be found in the Care Plan section)  Acute Rehab PT Goals Patient Stated Goal: Return home PT Goal Formulation: With patient Time For Goal Achievement: 11/21/17 Potential to Achieve Goals: Good    Frequency Min 5X/week   Barriers to discharge        Co-evaluation               AM-PAC PT "6 Clicks" Daily Activity  Outcome Measure Difficulty turning over in bed (including adjusting bedclothes, sheets and blankets)?: A Little Difficulty moving from lying on back to sitting on the side of the bed? : A Little Difficulty sitting down on and standing up from a chair with arms (e.g., wheelchair, bedside commode, etc,.)?: A Little Help needed moving to and from a bed to chair (including a wheelchair)?: A Little Help needed walking in hospital room?: A Little Help needed climbing 3-5 steps with a railing? : A Little 6 Click Score: 18    End of Session Equipment Utilized During Treatment: Gait belt Activity Tolerance: Patient tolerated treatment well Patient left: in chair;with  call bell/phone within reach Nurse Communication: Mobility status PT Visit Diagnosis: Unsteadiness on feet (R26.81);Other abnormalities of gait and mobility (R26.89);Pain Pain - Right/Left: Right Pain - part of body: Knee    Time: 7591-6384 PT Time Calculation (min) (ACUTE ONLY): 24 min   Charges:   PT Evaluation $PT Re-evaluation: 1 Re-eval PT Treatments $Gait Training: 8-22 mins   PT G Codes:       Mabeline Caras, PT, DPT Acute Rehab Services  Pager: [place] 11/07/2017, 2:32 PM

## 2017-11-07 NOTE — Progress Notes (Signed)
Family Medicine Teaching Service Daily Progress Note Intern Pager: [phone]  Patient name: Adam [name]. Medical record number: [id number] Date of birth: 07/23/1960 Age: 57 y.o. Gender: male  Primary Care Provider: [name], MD Consultants: orthopedic surgery, nephrology Code Status: full  Pt Overview and Major Events to Date:  Admitted to [facility], transferred to [facility] for dialysis and treatment 6/24 Received dialysis on 6/26 Approved for discharge from ortho perspective 6/27, then decision to wash out knee made from patient's home orthopedist Knee washout with fluid cultures recollected 6/28  Assessment and Plan: Lesslie Mckeehan. is a 57 y.o. male presenting with right leg pain, redness, and swelling.  His medical history includes ESRD on dialysis MWF, last dialysis on 6/24, atrial fibrillation on coumadin.  Moderate aortic stenosis, PAH, nonischemic cardiomyopathy with removal of ICD in April this year following MSSA bacteremia due to AV fistula infection.  R knee pain and swelling, post washout:  Being treated as culture negative septic arthritis, although inflammatory arthritis also a likely possibility.  Vancomycin and ceftriaxone started after washout per ID recommendations.  Length of therapy will depend on appearance of knee during surgery and results of cultures, according to Dr. Baxter Flattery. - follow up repeat blood cultures - no growth<12 hours - follow up intraoperative fluid culture - monitor clinical status of right knee and leg - ID recs for length of antibiotic therapy  - continue tramadol 50 mg Q6H PRN, use judiciously due to patient's hypotension  Painful subcutaneous masses: Three painful, swollen areas on L hip, axilla, and thoracolumbar area, with a new nodule on the R flank since hospitalization.  Have grown from quarter size to grapefruit size since April and are tender to palpation.  [place] only positive for edema, no abscesses visualized.   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 - surgery consult outpatient, will need deep tissue biopsy of the subcutaneous tissue - stop warfarin, start Eliquis after patient's outpatient vascular surgery procedure  QTc prolongation: EKG on 6/29 reports a QTc interval of 545, but manually calculated, QTc is closer to normal range at 465 msec. - monitor use of zofran; no need to make medication adjustments currently  ESRD: MWF.  Takes midodrine 10 mg TID AC for hypotension and sensipar 90 mg daily at 1700.  Dr. Bridgett Larsson performed a RUE venography and determined that his right AV fistula will not be usable.  They will try to get more access on left side.  Orders indicate that he may be getting a vascular procedure on 6/29. - follow up vascular surgery note  - continue home midodrine and sensipar  Recent MSSA bacteremia: occurred in April 2019, cultured from AV fistula wound and blood.  AV fistula was ligated, and he is currently getting dialysis from a perm cath, although RUE fistula is maturing.  ICD was removed at that time given bacteremia.  A TEE was performed but results are not in system. - f/u repeat blood cultures - NG x 12 hours  Anemia of chronic disease: Hemoglobin 9.3 on 6/28, appears to be around his baseline of ~9.  MCV in normal range.  Platelets are low at  but also seem to be at baseline.  Atrial fibrillation: Regular rhythm during admission, takes amiodarone 200 mg daily and coumadin 6 mg on Sunday, 9 mg other days.  INR therapeutic at 2.05 on 6/24. - continue amiodarone - Eliquis  after vascular procedure  PAH: Echo on 08/18/17 showed an PA peak pressure of 59 mmHg. Takes ambrisentan 10 mg daily and tadalafil 20 mg daily. - continue home medications   HFpEF with aortic stenosis: Echo with EF of 60-65%, dilated IVC with elevated central venous pressure, moderate aortic  stenosis with mean gradient of 25 mmHg. - administer fluids cautiously if needed   HLD: takes atorvastatin 40 mg daily - continue home atorvastatin  GERD: takes protonix 40 mg daily and sucralfate 1 g BID at home - continue home meds   Hypothyroidism: Iatrogenic d/t thyroidectomy for treatment of thyroid cancer.  Takes synthroid 300 mcg daily.  Last TSH was 3.73 in Sept 2018.  TSH 0.696 on 6/25 - continue synthroid   FEN/GI: renal diet PPx: SCDs  Disposition: continued inpatient stay  Subjective:  He says he was told by Dr. Bridgett Larsson that he will get a vascular procedure today.  He says that his knee is feeling better after receiving tramadol.  Objective: Temp:  [97.3 F (36.3 C)-98.3 F (36.8 C)] 97.5 F (36.4 C) (06/29 0500) Pulse Rate:  [64-79] 67 (06/29 0500) Resp:  [14-18] 14 (06/29 0500) BP: (79-108)/(48-80) 79/53 (06/29 0500) SpO2:  [94 %-100 %] 94 % (06/29 0500) Weight:  [269 lb 6.4 oz (122.2 kg)] 269 lb 6.4 oz (122.2 kg) (06/28 1100) Physical Exam: General: obese male lying in bed, alert, comfortable Cardiovascular: murmur from AV fistula, RRR Respiratory: CTAB Abdomen: soft, nontender, active bowel sounds Extremities: R knee wrapped in ace bandage, draining small amount of serosanguinous fluid  Laboratory: Recent Labs  Lab 11/04/17 0505 11/05/17 2013 11/06/17 0716  WBC 4.5 5.8 6.3  HGB 9.0* 10.0* 9.3*  HCT 29.6* 33.1* 30.4*  PLT 95* 159 143*   Recent Labs  Lab 11/04/17 0505 11/05/17 0518 11/06/17 0500  NA 136 136 134*  K 3.5 3.5 3.5  CL 98 99 97*  CO2 25 27 26   BUN 19 13 21*  CREATININE 7.02* 5.35* 6.78*  CALCIUM 8.0* 7.6* 8.1*  GLUCOSE 55* 71 126*    Imaging/Diagnostic Tests: Dg Chest 1 View  Result Date: 11/07/2017 CLINICAL DATA:  Pre-op chest exam for fistula EXAM: CHEST  1 VIEW COMPARISON:  09/24/2017 FINDINGS: Lungs are clear. Improvement in pulmonary vascular congestion. Stable tunneled left IJ hemodialysis catheter to the proximal right  atrium. Left axillary vascular stents as before. Stable cardiomegaly.  Aortic Atherosclerosis (ICD10-170.0). No effusion. Visualized bones unremarkable. IMPRESSION: Stable cardiomegaly.  No acute findings. Electronically Signed   By: Lucrezia Europe M.D.   On: 11/07/2017 08:22   [place] Chest Soft Tissue  Result Date: 11/03/2017 CLINICAL DATA:  Diffuse swelling along the LEFT back, flank, chest and axilla EXAM: ULTRASOUND OF HEAD/NECK SOFT TISSUES TECHNIQUE: Ultrasound examination of the LEFT flank, back, chest and axillary soft tissues was performed in the area of clinical concern. COMPARISON:  None FINDINGS: Diffuse subcutaneous edema along the LEFT flank, back, chest and axilla identified. No discrete abscess. No other abnormalities noted. IMPRESSION: Diffuse edema along the LEFT back, chest and axilla. No discrete abscess. Electronically Signed   By: Margarette Canada M.D.   On: 11/03/2017 18:50     Kathrene Alu, MD 11/07/2017, 8:31 AM PGY-1, [place] Intern pager: [phone], text pages welcome

## 2017-11-07 NOTE — Progress Notes (Signed)
Subjective: No new complaints   Antibiotics:  Anti-infectives (From admission, onward)   Start     Dose/Rate Route Frequency Ordered Stop   11/06/17 2127  ceFAZolin (ANCEF) IVPB 2g/100 mL premix  Status:  Discontinued     2 g 200 mL/hr over 30 Minutes Intravenous 30 min pre-op 11/06/17 2127 11/06/17 2132   11/06/17 2030  vancomycin (VANCOCIN) IVPB 1000 mg/200 mL premix     1,000 mg 200 mL/hr over 60 Minutes Intravenous Every M-W-F (Hemodialysis) 11/06/17 1912     11/06/17 2000  cefTRIAXone (ROCEPHIN) 2 g in sodium chloride 0.9 % 100 mL IVPB     2 g 200 mL/hr over 30 Minutes Intravenous Every 24 hours 11/06/17 1851     11/06/17 1430  ceFAZolin (ANCEF) 3 g in dextrose 5 % 50 mL IVPB     3 g 100 mL/hr over 30 Minutes Intravenous To Surgery 11/05/17 1954 11/06/17 1620   11/04/17 1200  vancomycin (VANCOCIN) IVPB 1000 mg/200 mL premix  Status:  Discontinued     1,000 mg 200 mL/hr over 60 Minutes Intravenous Every M-W-F (Hemodialysis) 11/03/17 1137 11/04/17 1115   11/04/17 0758  vancomycin (VANCOCIN) 1-5 GM/200ML-% IVPB    Note to Pharmacy:  Cherylann Banas   : cabinet override      11/04/17 0758 11/04/17 1009   11/03/17 1145  vancomycin (VANCOCIN) IVPB 1000 mg/200 mL premix     1,000 mg 200 mL/hr over 60 Minutes Intravenous  Once 11/03/17 1137 11/03/17 1403   11/02/17 2230  vancomycin (VANCOCIN) IVPB 1000 mg/200 mL premix     1,000 mg 200 mL/hr over 60 Minutes Intravenous  Once 11/02/17 2229 11/03/17 0140      Medications: Scheduled Meds: . ambrisentan  10 mg Oral Daily  . amiodarone  200 mg Oral QPM  . atorvastatin  40 mg Oral QPM  . calcium acetate  1,334 mg Oral TID WC  . Chlorhexidine Gluconate Cloth  6 each Topical Q0600  . cinacalcet  90 mg Oral QAC supper  . docusate sodium  100 mg Oral BID  . levothyroxine  300 mcg Oral QAC breakfast  . midodrine  10 mg Oral TID WC  . multivitamin  1 tablet Oral QHS  . pantoprazole  40 mg Oral BID AC  . sucralfate  1 g Oral  BID  . tadalafil  20 mg Oral Daily   Continuous Infusions: . sodium chloride 10 mL/hr at 11/06/17 1548  . sodium chloride    . cefTRIAXone (ROCEPHIN)  IV Stopped (11/06/17 2117)  . vancomycin Stopped (11/06/17 2150)   PRN Meds:.acetaminophen **OR** acetaminophen, metoCLOPramide **OR** metoCLOPramide (REGLAN) injection, ondansetron **OR** ondansetron (ZOFRAN) IV, traMADol    Objective: Weight change: -1 lb 1.9 oz (-0.507 kg)  Intake/Output Summary (Last 24 hours) at 11/07/2017 1617 Last data filed at 11/07/2017 0700 Gross per 24 hour  Intake 928.29 ml  Output 40 ml  Net 888.29 ml   Blood pressure (!) 76/52, pulse 71, temperature 98 F (36.7 C), temperature source Oral, resp. rate 16, height 6\' 1"  (1.854 m), weight 269 lb 6.4 oz (122.2 kg), SpO2 93 %. Temp:  [97.3 F (36.3 C)-98 F (36.7 C)] 98 F (36.7 C) (06/29 0951) Pulse Rate:  [64-79] 71 (06/29 0951) Resp:  [14-18] 16 (06/29 0951) BP: (76-95)/(52-60) 76/52 (06/29 0951) SpO2:  [93 %-100 %] 93 % (06/29 0951)  Physical Exam: General: Alert and awake, oriented x3, not in any acute distress. HEENT: anicteric sclera,  EOMI CVS regular rate, normal  Chest: , no wheezing, no respiratory distress Abdomen: soft non-distended,  Extremities: right knee bandaged Skin: no rashes Neuro: nonfocal  CBC:    BMET Recent Labs    11/06/17 0500 11/07/17 0803  NA 134* 137  K 3.5 3.3*  CL 97* 96*  CO2 26 28  GLUCOSE 126* 83  BUN 21* 13  CREATININE 6.78* 5.03*  CALCIUM 8.1* 8.1*     Liver Panel  Recent Labs    11/06/17 0500  ALBUMIN 3.0*       Sedimentation Rate Recent Labs    11/05/17 1916  ESRSEDRATE 53*   C-Reactive Protein Recent Labs    11/05/17 1916  CRP 11.4*    Micro Results: Recent Results (from the past 720 hour(s))  Clostridium Difficile by PCR([facility])     Status: None   Collection Time: 10/30/17 12:03 PM  Result Value Ref Range Status   Toxigenic C. Difficile by PCR Negative  Negative Final  Body fluid culture     Status: None   Collection Time: 11/02/17  8:58 PM  Result Value Ref Range Status   Specimen Description   Final    KNEE RIGHT Performed at [facility] [address] [place], [place] [address]    Special Requests   Final    Normal Performed at [facility], [place] [address], [place], [place] [address]    Gram Stain   Final    MODERATE WBC PRESENT, PREDOMINANTLY PMN NO ORGANISMS SEEN Gram Stain Report Called to,Read Back By and Verified With: JOHNSON,L RN AT 3244 11/02/17 BY TIBBITTS,K Performed at [facility], [place] [address], [place], [place] [address]    Culture   Final    NO GROWTH 3 DAYS Performed at [facility], [place] [address] [place], [place] [address]    Report Status 11/06/2017 FINAL  Final  Culture, blood (routine x 2)     Status: None (Preliminary result)   Collection Time: 11/03/17 12:32 AM  Result Value Ref Range Status   Specimen Description   Final    BLOOD LEFT FOREARM Performed at [facility] [address] [place], [place] [address]    Special Requests   Final    BOTTLES DRAWN AEROBIC AND ANAEROBIC Blood Culture adequate volume Performed at [facility] [address] [place], [place] [address]    Culture   Final    NO GROWTH 4 DAYS Performed at [facility], [place] [address] [place], [place] [address]    Report Status PENDING  Incomplete  MRSA PCR Screening     Status: None   Collection Time: 11/03/17  6:45 AM  Result Value Ref Range Status   MRSA by PCR NEGATIVE NEGATIVE Final    Comment:        The GeneXpert MRSA Assay (FDA approved for NASAL specimens only), is one component of a comprehensive MRSA colonization surveillance program. It is not intended to diagnose MRSA infection nor to guide or monitor treatment for MRSA infections. Performed at [facility], [place] [address],  [place], [place] [address]   Culture, blood (routine x 2)     Status: None (Preliminary result)   Collection Time: 11/05/17  7:17 PM  Result Value Ref Range Status   Specimen Description BLOOD LEFT HAND  Final   Special Requests   Final    BOTTLES DRAWN AEROBIC AND ANAEROBIC Blood Culture adequate volume  Culture   Final    NO GROWTH 2 DAYS Performed at [facility], [place] [address], [place], [place] [address]    Report Status PENDING  Incomplete  Culture, blood (routine x 2)     Status: None (Preliminary result)   Collection Time: 11/05/17  7:19 PM  Result Value Ref Range Status   Specimen Description BLOOD LEFT FOREARM  Final   Special Requests   Final    BOTTLES DRAWN AEROBIC ONLY Blood Culture adequate volume   Culture   Final    NO GROWTH 2 DAYS Performed at [facility], [place] [address], [place], [place] [address]    Report Status PENDING  Incomplete  Aerobic/Anaerobic Culture (surgical/deep wound)     Status: None (Preliminary result)   Collection Time: 11/06/17  5:11 PM  Result Value Ref Range Status   Specimen Description TISSUE  Final   Special Requests NONE  Final   Gram Stain   Final    ABUNDANT WBC PRESENT,BOTH PMN AND MONONUCLEAR NO ORGANISMS SEEN    Culture   Final    NO GROWTH < 24 HOURS Performed at [facility], [place] [address], [place], [place] [address]    Report Status PENDING  Incomplete  Aerobic/Anaerobic Culture (surgical/deep wound)     Status: None (Preliminary result)   Collection Time: 11/06/17  5:16 PM  Result Value Ref Range Status   Specimen Description TISSUE  Final   Special Requests NONE  Final   Gram Stain   Final    RARE WBC PRESENT,BOTH PMN AND MONONUCLEAR NO ORGANISMS SEEN    Culture   Final    NO GROWTH < 24 HOURS Performed at [facility], [place] [address], [place], [place] [address]    Report Status PENDING  Incomplete  Surgical pcr screen     Status: None   Collection Time: 11/07/17 12:28 PM  Result Value  Ref Range Status   MRSA, PCR NEGATIVE NEGATIVE Final   Staphylococcus aureus NEGATIVE NEGATIVE Final    Comment: (NOTE) The Xpert SA Assay (FDA approved for NASAL specimens in patients 55 years of age and older), is one component of a comprehensive surveillance program. It is not intended to diagnose infection nor to guide or monitor treatment. Performed at [facility], [place] [address], [place], [place] [address]     Studies/Results: Dg Chest 1 View  Result Date: 11/07/2017 CLINICAL DATA:  Pre-op chest exam for fistula EXAM: CHEST  1 VIEW COMPARISON:  09/24/2017 FINDINGS: Lungs are clear. Improvement in pulmonary vascular congestion. Stable tunneled left IJ hemodialysis catheter to the proximal right atrium. Left axillary vascular stents as before. Stable cardiomegaly.  Aortic Atherosclerosis (ICD10-170.0). No effusion. Visualized bones unremarkable. IMPRESSION: Stable cardiomegaly.  No acute findings. Electronically Signed   By: Lucrezia Europe M.D.   On: 11/07/2017 08:22      Assessment/Plan:  INTERVAL HISTORY: cultures still incubating   Principal Problem:   Septic arthritis of knee, right (HCC) Active Problems:   Atrial fibrillation [I48.91]   Pulmonary hypertension (HCC)   ESRD (end stage renal disease) on hemodialysis   Infected defibrillator [facility])   Dialysis AV fistula infection, sequela   Malnutrition of moderate degree    Adam [name]. is a 57 y.o. male with prior MSSABin April now admitted with septic knee. Unfortunately as has been typical in our institution he received antibiotics before I and D in the OR for his septic knee  compromising said cultures.  Organisms is LIKELY MSSA but he has received coverage for MR organisms with vancomycin.  Cultures unrevealing at present.  HOpefully he will grow an organism  REgardless he needs another 6 weeks of therapy after appropriately aggressive open arthrotomy.   LOS: 5 days   Alcide Evener 11/07/2017, 4:17 PM

## 2017-11-08 ENCOUNTER — Encounter (HOSPITAL_COMMUNITY): Payer: Self-pay | Admitting: Orthopedic Surgery

## 2017-11-08 DIAGNOSIS — N186 End stage renal disease: Secondary | ICD-10-CM | POA: Diagnosis not present

## 2017-11-08 DIAGNOSIS — E1129 Type 2 diabetes mellitus with other diabetic kidney complication: Secondary | ICD-10-CM | POA: Diagnosis not present

## 2017-11-08 DIAGNOSIS — Z992 Dependence on renal dialysis: Secondary | ICD-10-CM | POA: Diagnosis not present

## 2017-11-08 LAB — CULTURE, BLOOD (ROUTINE X 2)
Culture: NO GROWTH
Special Requests: ADEQUATE

## 2017-11-08 LAB — ACID FAST SMEAR (AFB): ACID FAST SMEAR - AFSCU2: NEGATIVE

## 2017-11-08 LAB — PROTIME-INR
INR: 2.23
PROTHROMBIN TIME: 24.6 s — AB (ref 11.4–15.2)

## 2017-11-08 LAB — ACID FAST SMEAR (AFB, MYCOBACTERIA): Acid Fast Smear: NEGATIVE

## 2017-11-08 MED ORDER — CEFAZOLIN SODIUM-DEXTROSE 1-4 GM/50ML-% IV SOLN
1.0000 g | INTRAVENOUS | Status: DC
  Filled 2017-11-08 (×2): qty 50

## 2017-11-08 MED ORDER — CHLORHEXIDINE GLUCONATE CLOTH 2 % EX PADS
6.0000 | MEDICATED_PAD | Freq: Every day | CUTANEOUS | Status: DC
  Administered 2017-11-08: 6 via TOPICAL

## 2017-11-08 MED ORDER — POTASSIUM CHLORIDE CRYS ER 20 MEQ PO TBCR
40.0000 meq | EXTENDED_RELEASE_TABLET | Freq: Two times a day (BID) | ORAL | Status: AC
  Administered 2017-11-08: 40 meq via ORAL
  Filled 2017-11-08: qty 2

## 2017-11-08 NOTE — Progress Notes (Signed)
Spoke with Dr. Bridgett Larsson, Patient is on OR schedule for dialysis access on Tuesday  [facility]

## 2017-11-08 NOTE — Progress Notes (Signed)
      INFECTIOUS DISEASE ATTENDING ADDENDUM:   Date: 11/08/2017  Patient name: Adam [name].  Medical record number: [id number]  Date of birth: Aug 25, 1960   Diagnosis: Septic knee  Culture Result: No growth  Allergies  Allergen Reactions  . Enalaprilat Swelling and Other (See Comments)    Mouth swelling  . Vasotec [Enalapril] Swelling and Other (See Comments)    MOUTH SWELLING  . Iodinated Diagnostic Agents Nausea And Vomiting and Nausea Only    OPAT Orders Discharge antibiotics: Cefazolin and vancomycin dosed with hemodialysis  Duration: 6 weeks  End Date: August 9th, 2019     [place] Per Protocol:  Labs weekly while on IV antibiotics: _x_ CBC with differential  x__ Vancomycin trough    Fax weekly labs to [phone]  Clinic Follow Up Appt:  4 weeks.  @     Rhina [place] 11/08/2017, 3:15 PM

## 2017-11-08 NOTE — Progress Notes (Signed)
ANTIBIOTIC CONSULT NOTE - INITIAL  Pharmacy Consult for Ancef Indication: MSSA septic arthritis  Allergies  Allergen Reactions  . Enalaprilat Swelling and Other (See Comments)    Mouth swelling  . Vasotec [Enalapril] Swelling and Other (See Comments)    MOUTH SWELLING  . Iodinated Diagnostic Agents Nausea And Vomiting and Nausea Only    Patient Measurements: Height: 6\' 1"  (185.4 cm) Weight: 269 lb 6.4 oz (122.2 kg) IBW/kg (Calculated) : 79.9 Adjusted Body Weight:    Vital Signs: Temp: 97.9 F (36.6 C) (06/30 0523) Temp Source: Oral (06/30 0523) BP: 75/53 (06/30 0953) Pulse Rate: 78 (06/30 0953) Intake/Output from previous day: 06/29 0701 - 06/30 0700 In: 120 [P.O.:120] Out: 115 [Drains:115] Intake/Output from this shift: No intake/output data recorded.  Labs: Recent Labs    11/05/17 2013 11/06/17 0500 11/06/17 0716 11/07/17 0803  WBC 5.8  --  6.3 5.1  HGB 10.0*  --  9.3* 9.0*  PLT 159  --  143* 127*  CREATININE  --  6.78*  --  5.03*   Estimated Creatinine Clearance: 22.7 mL/min (A) (by C-G formula based on SCr of 5.03 mg/dL (H)). No results for input(s): VANCOTROUGH, VANCOPEAK, VANCORANDOM, GENTTROUGH, GENTPEAK, GENTRANDOM, TOBRATROUGH, TOBRAPEAK, TOBRARND, AMIKACINPEAK, AMIKACINTROU, AMIKACIN in the last 72 hours.   Microbiology:   Medical History: Past Medical History:  Diagnosis Date  . AICD (automatic cardioverter/defibrillator) present    [employer]tronic   . Aortic stenosis    moderate AS by 08/2016 echo  . Arthritis    "knees" (08/05/2016)  . Atrial fibrillation ([place])   . ESRD (end stage renal disease) on dialysis ([place])    "M, T, W, T, F; NX stage hemodialysis; I do it at home" (08/05/2016)  . 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 History of blood transfusion 2009   "S/P biopsy for prostate cancer check"  . Hypertension   . Hypothyroidism (acquired)   . Mitral stenosis    moderate mitral stenosis  .  Nonischemic cardiomyopathy ([place])   . Pneumonia 2014; 08/05/2016  . PONV (postoperative nausea and vomiting)   . Pulmonary HTN ([place])   . Renal insufficiency   . Thyroid cancer ([place]) 2011    Assessment: ID: Septic arthritis  Knee tapped and waiting on cultures. Likely inflammatory not infectious.  Afebrile. WBC WNL. Abx x 6 weeks - 6/28: I&D of knee  Rocephin 6/28>> Vancomycin 6/24>> - (1g on 6/24Mon, 6/25Tues, 6/26Wed, 6/28Fri)  6/24 Knee aspirate: NGTD 6/27: BC x 2>> 6/25: BC>> 6/28: Tissue cx x 2>> 6/29: MRSA PCR: negative  Goal of Therapy:  Vancomycin level <15 prior to re-dosing post-HD.  Plan:  Vanco 1g IV MWF Rocephin 2g IV q 24h ?d/c? Add Ancef 2g IV q MWF with HD  Serrena Linderman S. Alford Highland, PharmD, BCPS Clinical Staff Pharmacist Pager [place], [place] 11/08/2017,11:02 AM

## 2017-11-08 NOTE — Progress Notes (Signed)
[facility] Progress Note   Subjective:  Seen walking in the halls  Objective Vitals:   11/07/17 1836 11/07/17 2100 11/08/17 0523 11/08/17 0953  BP: (!) 83/53 (!) 76/56 (!) 68/36 (!) 75/53  Pulse: 77 77 69 78  Resp: 18 20 20 20   Temp: (!) 97.5 F (36.4 C) (!) 97.5 F (36.4 C) 97.9 F (36.6 C)   TempSrc: Oral Oral Oral   SpO2: 100% 100% 98% 96%  Weight:      Height:       Physical Exam General: Well appearing, NAD Skin: 3 ill defined tender nodules without subcutaneous fat (2 in L flank, 1 R flank/ chest area) Heart: RRR; no murmur Lungs: CTAB Abdomen: soft, non-tender Extremities: Swollen R knee, tense/woody edema in B lower legs. Dialysis Access: TDC in L chest, new R AVG (10/13/17) + edema/ + bruit  Additional Objective Labs: Basic Metabolic Panel: Recent Labs  Lab 11/04/17 0505 11/05/17 0518 11/06/17 0500 11/07/17 0803  NA 136 136 134* 137  K 3.5 3.5 3.5 3.3*  CL 98 99 97* 96*  CO2 25 27 26 28   GLUCOSE 55* 71 126* 83  BUN 19 13 21* 13  CREATININE 7.02* 5.35* 6.78* 5.03*  CALCIUM 8.0* 7.6* 8.1* 8.1*  PHOS 3.3  --  2.7  --    Liver Function Tests: Recent Labs  Lab 11/04/17 0505 11/06/17 0500  ALBUMIN 2.8* 3.0*   CBC: Recent Labs  Lab 11/03/17 0031 11/04/17 0505 11/05/17 2013 11/06/17 0716 11/07/17 0803  WBC 4.9 4.5 5.8 6.3 5.1  NEUTROABS 4.1  --   --   --   --   HGB 9.6* 9.0* 10.0* 9.3* 9.0*  HCT 31.4* 29.6* 33.1* 30.4* 30.6*  MCV 92.9 92.5 92.7 91.3 93.6  PLT 115* 95* 159 143* 127*   Blood Culture    Component Value Date/Time   SDES TISSUE 11/06/2017 1716   SPECREQUEST NONE 11/06/2017 1716   CULT  11/06/2017 1716    NO GROWTH 2 DAYS NO ANAEROBES ISOLATED; CULTURE IN PROGRESS FOR 5 DAYS Performed at [facility], [place] [address], [place], [place] [address]    REPTSTATUS PENDING 11/06/2017 1716   Studies/Results: Dg Chest 1 View  Result Date: 11/07/2017 CLINICAL DATA:  Pre-op chest exam for fistula EXAM: CHEST  1  VIEW COMPARISON:  09/24/2017 FINDINGS: Lungs are clear. Improvement in pulmonary vascular congestion. Stable tunneled left IJ hemodialysis catheter to the proximal right atrium. Left axillary vascular stents as before. Stable cardiomegaly.  Aortic Atherosclerosis (ICD10-170.0). No effusion. Visualized bones unremarkable. IMPRESSION: Stable cardiomegaly.  No acute findings. Electronically Signed   By: Lucrezia Europe M.D.   On: 11/07/2017 08:22   Medications: . sodium chloride 10 mL/hr at 11/06/17 1548  . sodium chloride    .  ceFAZolin (ANCEF) IV    . cefTRIAXone (ROCEPHIN)  IV 2 g (11/07/17 2211)  . vancomycin Stopped (11/06/17 2150)   . ambrisentan  10 mg Oral Daily  . amiodarone  200 mg Oral QPM  . atorvastatin  40 mg Oral QPM  . calcium acetate  1,334 mg Oral TID WC  . Chlorhexidine Gluconate Cloth  6 each Topical Q0600  . cinacalcet  90 mg Oral QAC supper  . docusate sodium  100 mg Oral BID  . levothyroxine  300 mcg Oral QAC breakfast  . midodrine  10 mg Oral TID WC  . multivitamin  1 tablet Oral QHS  . pantoprazole  40 mg Oral BID AC  .  potassium chloride  40 mEq Oral BID  . sucralfate  1 g Oral BID  . tadalafil  20 mg Oral Daily    Dialysis Orders: MWF at [facility] 4h  450/800  124.5kg  3K/2.25 bath  AVF + TDC  Heparin none (on coumadin) - Mircera 225mcg IV q 2 weeks (last 6/17) - Venofer 100mg  x 10 ordered  Assessment/Plan: 1. R knee pain /effusion: Gram stain with >45000 WBC. On Vanc. Blood and knee fluid cx's negative. SP washout per orthopedics on 11/06/17.  Working w PT 2. ESRD:cont MWF HD. HD Monday, using TDC.  3. Hypotension/volume:Chronic low BP, on midodrine. Under EDW by 2-3kg. Change at dc.  4. Anemia:Hgb 9.0 - on max ESA, not due for dose yet - follow. 5. Metabolic bone disease:Corr Ca/Phos ok, continue home sensipar and Phoslo. 6. A-fib:Warfarin on hold in case surgery needed. Per primary 7. PAH 8.  AVF dysfunction: + RUE edema, central veins  stenosed/occluded by venogram here done by [facility]. AVF unalbe to salvage.  Using [facility].  Is on schedule by [facility] for HeRO candidate this Tuesday.  9. 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 Also takes warfarin. Rec deep biopsy of the nodules If + for calcifications, will need to change warfarin to Eliquis amongst various other changes   Kelly Splinter MD [employer] pager [phone]   11/08/2017, 11:05 AM

## 2017-11-08 NOTE — Progress Notes (Signed)
Orthopedics Progress Note  Subjective: No complaints this AM. Pain well controlled. Up with therapy.  Objective:  Vitals:   11/07/17 2100 11/08/17 0523  BP: (!) 76/56 (!) 68/36  Pulse: 77 69  Resp: 20 20  Temp: (!) 97.5 F (36.4 C) 97.9 F (36.6 C)  SpO2: 100% 98%    General: Awake and alert  Musculoskeletal: Drain removed and dressing placed over drain hole. Compression bandage in place for the leg. Neurovascularly intact  Lab Results  Component Value Date   WBC 5.1 11/07/2017   HGB 9.0 (L) 11/07/2017   HCT 30.6 (L) 11/07/2017   MCV 93.6 11/07/2017   PLT 127 (L) 11/07/2017       Component Value Date/Time   NA 137 11/07/2017 0803   K 3.3 (L) 11/07/2017 0803   CL 96 (L) 11/07/2017 0803   CO2 28 11/07/2017 0803   GLUCOSE 83 11/07/2017 0803   BUN 13 11/07/2017 0803   CREATININE 5.03 (H) 11/07/2017 0803   CREATININE 4.91 (H) 08/13/2015 1223   CALCIUM 8.1 (L) 11/07/2017 0803   GFRNONAA 12 (L) 11/07/2017 0803   GFRNONAA 13 (L) 07/27/2014 1539   GFRAA 14 (L) 11/07/2017 0803   GFRAA 15 (L) 07/27/2014 1539    Lab Results  Component Value Date   INR 2.23 11/08/2017   INR 2.26 11/07/2017   INR 2.19 11/06/2017    Assessment/Plan: POD #2 s/p Procedure(s): IRRIGATION AND DEBRIDEMENT KNEE Continue empiric abx - no growth on cultures to this point Up with therapy D/C planning  Doran Heater. Veverly Fells, MD 11/08/2017 9:22 AM

## 2017-11-08 NOTE — Progress Notes (Signed)
Subjective: No new complaints   Antibiotics:  Anti-infectives (From admission, onward)   Start     Dose/Rate Route Frequency Ordered Stop   11/08/17 1200  ceFAZolin (ANCEF) IVPB 1 g/50 mL premix     1 g 100 mL/hr over 30 Minutes Intravenous Every M-W-F (Hemodialysis) 11/08/17 1101     11/06/17 2127  ceFAZolin (ANCEF) IVPB 2g/100 mL premix  Status:  Discontinued     2 g 200 mL/hr over 30 Minutes Intravenous 30 min pre-op 11/06/17 2127 11/06/17 2132   11/06/17 2030  vancomycin (VANCOCIN) IVPB 1000 mg/200 mL premix     1,000 mg 200 mL/hr over 60 Minutes Intravenous Every M-W-F (Hemodialysis) 11/06/17 1912     11/06/17 2000  cefTRIAXone (ROCEPHIN) 2 g in sodium chloride 0.9 % 100 mL IVPB     2 g 200 mL/hr over 30 Minutes Intravenous Every 24 hours 11/06/17 1851     11/06/17 1430  ceFAZolin (ANCEF) 3 g in dextrose 5 % 50 mL IVPB     3 g 100 mL/hr over 30 Minutes Intravenous To Surgery 11/05/17 1954 11/06/17 1620   11/04/17 1200  vancomycin (VANCOCIN) IVPB 1000 mg/200 mL premix  Status:  Discontinued     1,000 mg 200 mL/hr over 60 Minutes Intravenous Every M-W-F (Hemodialysis) 11/03/17 1137 11/04/17 1115   11/04/17 0758  vancomycin (VANCOCIN) 1-5 GM/200ML-% IVPB    Note to Pharmacy:  Cherylann Banas   : cabinet override      11/04/17 0758 11/04/17 1009   11/03/17 1145  vancomycin (VANCOCIN) IVPB 1000 mg/200 mL premix     1,000 mg 200 mL/hr over 60 Minutes Intravenous  Once 11/03/17 1137 11/03/17 1403   11/02/17 2230  vancomycin (VANCOCIN) IVPB 1000 mg/200 mL premix     1,000 mg 200 mL/hr over 60 Minutes Intravenous  Once 11/02/17 2229 11/03/17 0140      Medications: Scheduled Meds: . ambrisentan  10 mg Oral Daily  . amiodarone  200 mg Oral QPM  . atorvastatin  40 mg Oral QPM  . calcium acetate  1,334 mg Oral TID WC  . Chlorhexidine Gluconate Cloth  6 each Topical Q0600  . Chlorhexidine Gluconate Cloth  6 each Topical Q0600  . cinacalcet  90 mg Oral QAC supper  .  docusate sodium  100 mg Oral BID  . levothyroxine  300 mcg Oral QAC breakfast  . midodrine  10 mg Oral TID WC  . multivitamin  1 tablet Oral QHS  . pantoprazole  40 mg Oral BID AC  . sucralfate  1 g Oral BID  . tadalafil  20 mg Oral Daily   Continuous Infusions: . sodium chloride 10 mL/hr at 11/06/17 1548  . sodium chloride    .  ceFAZolin (ANCEF) IV    . cefTRIAXone (ROCEPHIN)  IV 2 g (11/07/17 2211)  . vancomycin Stopped (11/06/17 2150)   PRN Meds:.acetaminophen **OR** acetaminophen, metoCLOPramide **OR** metoCLOPramide (REGLAN) injection, ondansetron **OR** ondansetron (ZOFRAN) IV, traMADol    Objective: Weight change:   Intake/Output Summary (Last 24 hours) at 11/08/2017 1513 Last data filed at 11/08/2017 0654 Gross per 24 hour  Intake 120 ml  Output 115 ml  Net 5 ml   Blood pressure (!) 75/53, pulse 78, temperature 97.9 F (36.6 C), temperature source Oral, resp. rate 20, height 6\' 1"  (1.854 m), weight 269 lb 6.4 oz (122.2 kg), SpO2 96 %. Temp:  [97.5 F (36.4 C)-97.9 F (36.6 C)] 97.9 F (36.6 C) (06/30  7939) Pulse Rate:  [69-78] 78 (06/30 0953) Resp:  [18-20] 20 (06/30 0953) BP: (68-83)/(36-56) 75/53 (06/30 0953) SpO2:  [96 %-100 %] 96 % (06/30 0953)  Physical Exam: General: Alert and awake, oriented x3, not in any acute distress. HEENT: anicteric sclera, EOMI CVS regular rate, normal  Chest: , no wheezing, no respiratory distress Abdomen: soft non-distended,  Extremities: right knee bandaged Skin: no rashes Neuro: nonfocal  CBC:    BMET Recent Labs    11/06/17 0500 11/07/17 0803  NA 134* 137  K 3.5 3.3*  CL 97* 96*  CO2 26 28  GLUCOSE 126* 83  BUN 21* 13  CREATININE 6.78* 5.03*  CALCIUM 8.1* 8.1*     Liver Panel  Recent Labs    11/06/17 0500  ALBUMIN 3.0*       Sedimentation Rate Recent Labs    11/05/17 1916  ESRSEDRATE 53*   C-Reactive Protein Recent Labs    11/05/17 1916  CRP 11.4*    Micro Results: Recent Results  (from the past 720 hour(s))  Clostridium Difficile by PCR([facility])     Status: None   Collection Time: 10/30/17 12:03 PM  Result Value Ref Range Status   Toxigenic C. Difficile by PCR Negative Negative Final  Body fluid culture     Status: None   Collection Time: 11/02/17  8:58 PM  Result Value Ref Range Status   Specimen Description   Final    KNEE RIGHT Performed at [facility] [address] [place], [place] [address]    Special Requests   Final    Normal Performed at [facility], [place] [address], [place], [place] [address]    Gram Stain   Final    MODERATE WBC PRESENT, PREDOMINANTLY PMN NO ORGANISMS SEEN Gram Stain Report Called to,Read Back By and Verified With: JOHNSON,L RN AT 7622 11/02/17 BY TIBBITTS,K Performed at [facility], [place] [address], [place], [place] [address]    Culture   Final    NO GROWTH 3 DAYS Performed at [facility], [place] [address], [place], [place] [address]    Report Status 11/06/2017 FINAL  Final  Culture, blood (routine x 2)     Status: None   Collection Time: 11/03/17 12:32 AM  Result Value Ref Range Status   Specimen Description   Final    BLOOD LEFT FOREARM Performed at [facility] [address], [place], [place] [address]    Special Requests   Final    BOTTLES DRAWN AEROBIC AND ANAEROBIC Blood Culture adequate volume Performed at [facility] [address], [place], [place] [address]    Culture   Final    NO GROWTH 5 DAYS Performed at [facility], [place] [address], [place], [place] [address]    Report Status 11/08/2017 FINAL  Final  MRSA PCR Screening     Status: None   Collection Time: 11/03/17  6:45 AM  Result Value Ref Range Status   MRSA by PCR NEGATIVE NEGATIVE Final    Comment:        The GeneXpert MRSA Assay (FDA approved for NASAL specimens only), is one component of a comprehensive MRSA  colonization surveillance program. It is not intended to diagnose MRSA infection nor to guide or monitor treatment for MRSA infections. Performed at [facility], [place] [address] [place], [place] [address]   Culture, blood (routine x 2)     Status: None (  Preliminary result)   Collection Time: 11/05/17  7:17 PM  Result Value Ref Range Status   Specimen Description BLOOD LEFT HAND  Final   Special Requests   Final    BOTTLES DRAWN AEROBIC AND ANAEROBIC Blood Culture adequate volume   Culture   Final    NO GROWTH 3 DAYS Performed at [facility], [address], [place], [place] [address]    Report Status PENDING  Incomplete  Culture, blood (routine x 2)     Status: None (Preliminary result)   Collection Time: 11/05/17  7:19 PM  Result Value Ref Range Status   Specimen Description BLOOD LEFT FOREARM  Final   Special Requests   Final    BOTTLES DRAWN AEROBIC ONLY Blood Culture adequate volume   Culture   Final    NO GROWTH 3 DAYS Performed at [facility], [address], [place], [place] [address]    Report Status PENDING  Incomplete  Aerobic/Anaerobic Culture (surgical/deep wound)     Status: None (Preliminary result)   Collection Time: 11/06/17  5:11 PM  Result Value Ref Range Status   Specimen Description TISSUE  Final   Special Requests NONE  Final   Gram Stain   Final    ABUNDANT WBC PRESENT,BOTH PMN AND MONONUCLEAR NO ORGANISMS SEEN    Culture   Final    NO GROWTH 2 DAYS NO ANAEROBES ISOLATED; CULTURE IN PROGRESS FOR 5 DAYS Performed at [facility], [place] [address], [place], [place] [address]    Report Status PENDING  Incomplete  Aerobic/Anaerobic Culture (surgical/deep wound)     Status: None (Preliminary result)   Collection Time: 11/06/17  5:16 PM  Result Value Ref Range Status   Specimen Description TISSUE  Final   Special Requests NONE  Final   Gram Stain   Final    RARE WBC PRESENT,BOTH PMN AND MONONUCLEAR NO ORGANISMS SEEN     Culture   Final    NO GROWTH 2 DAYS NO ANAEROBES ISOLATED; CULTURE IN PROGRESS FOR 5 DAYS Performed at [facility], [place] [address] [place], [place] [address]    Report Status PENDING  Incomplete  Surgical pcr screen     Status: None   Collection Time: 11/07/17 12:28 PM  Result Value Ref Range Status   MRSA, PCR NEGATIVE NEGATIVE Final   Staphylococcus aureus NEGATIVE NEGATIVE Final    Comment: (NOTE) The Xpert SA Assay (FDA approved for NASAL specimens in patients 55 years of age and older), is one component of a comprehensive surveillance program. It is not intended to diagnose infection nor to guide or monitor treatment. Performed at [facility], [place] [address], [place], [place] [address]     Studies/Results: Dg Chest 1 View  Result Date: 11/07/2017 CLINICAL DATA:  Pre-op chest exam for fistula EXAM: CHEST  1 VIEW COMPARISON:  09/24/2017 FINDINGS: Lungs are clear. Improvement in pulmonary vascular congestion. Stable tunneled left IJ hemodialysis catheter to the proximal right atrium. Left axillary vascular stents as before. Stable cardiomegaly.  Aortic Atherosclerosis (ICD10-170.0). No effusion. Visualized bones unremarkable. IMPRESSION: Stable cardiomegaly.  No acute findings. Electronically Signed   By: Lucrezia Europe M.D.   On: 11/07/2017 08:22      Assessment/Plan:  INTERVAL HISTORY: cultures still incubating but without growth   Principal Problem:   Septic arthritis of knee, right (HCC) Active Problems:   Atrial fibrillation [I48.91]   Pulmonary hypertension (HCC)   ESRD (end stage  renal disease) on hemodialysis   Infected defibrillator [facility])   Dialysis AV fistula infection, sequela   Malnutrition of moderate degree   Abscess   Acute pain of right knee    Gianluca Chhim. is a 57 y.o. male with prior MSSABin April now admitted with septic knee. Cultures from ER BEFORE antibiotics given NO GROWTH FINAL and cultures from the operating room after  antibiotics have been given have also been without growth.  Organisms is LIKELY MSSA but he has received coverage for MR organisms with vancomycin.  Cultures unrevealing at present.  Now I will plan on giving him a 6-week regimen of cefazolin dosed with dialysis and vancomycin dosed with dialysis to cover prior MSSA in a more bactericidal manner but also cover for MRSA and MR coag negative staph should these be potential pathogens.  We will follow-up the cultures from his knee but will otherwise only follow peripherally.  We will arrange for hospital follow-up in our clinic.  Please call with further questions.   LOS: 6 days   Alcide Evener 11/08/2017, 3:13 PM

## 2017-11-08 NOTE — Progress Notes (Signed)
Spoke with MD on call regarding BP of 76/56. MD stated that pt BP is chronically low and pt is on midodrine TID with meals. No new orders were given. Will continue to monitor.   Eleanora Neighbor, RN

## 2017-11-08 NOTE — Progress Notes (Signed)
Physical Therapy Treatment Patient Details Name: Adam [name]. MRN: [id number] DOB: Jul 09, 1960 Today's Date: 11/08/2017    History of Present Illness Pt is a 57 y.o. male admitted 11/02/17 with c/o R knee pain; suspected R knee septic arthritis. Now s/p R knee arthrotomy and drainage 6/28. 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 PMH includes ESRD (HD MWF), a-fib, HTN, cardiac debibrillator, subcutaneous nodules.    PT Comments    Continuing work on functional mobility and activity tolerance; Good progress with amb distance and initiated stair training; Encouraged pt to preform therex everyday, and highlighted the exercises he can perform independently; Good progress   Follow Up Recommendations  Home health PT;Supervision - Intermittent     Equipment Recommendations  Rolling walker with 5" wheels;3in1 (PT)    Recommendations for Other Services       Precautions / Restrictions Precautions Precautions: Fall Precaution Comments: R knee hemovac drain-pulled 6/30 Restrictions Weight Bearing Restrictions: Yes RLE Weight Bearing: Weight bearing as tolerated    Mobility  Bed Mobility Overal bed mobility: Modified Independent             General bed mobility comments: Incr time, but no difficulty getting into chair  Transfers Overall transfer level: Needs assistance Equipment used: Rolling walker (2 wheeled) Transfers: Sit to/from Stand Sit to Stand: Supervision         General transfer comment: Cues for hand placement  Ambulation/Gait Ambulation/Gait assistance: Min guard Gait Distance (Feet): 175 Feet Assistive device: Rolling walker (2 wheeled) Gait Pattern/deviations: Step-through pattern;Decreased stride length;Decreased weight shift to right     General Gait Details: Cues for standing tall on RLE and quad and glut activation for stance stability; initial heavy relience on BUEs, but able to increase RLE WBAT with  cues   Stairs Stairs: Yes Stairs assistance: Min guard Stair Management: Two rails(and he demo'd right arm/elbow prop on wall , the way he does at home) Number of Stairs: 6 General stair comments: Cues for gait sequence; Managed stairs well; UEs fatigued after stair practice   Wheelchair Mobility    Modified Rankin (Stroke Patients Only)       Balance Overall balance assessment: Needs assistance   Sitting balance-Leahy Scale: Good       Standing balance-Leahy Scale: Fair Standing balance comment: can release walker in static standing at sink and to manage clothing                            Cognition Arousal/Alertness: Awake/alert Behavior During Therapy: WFL for tasks assessed/performed Overall Cognitive Status: Within Functional Limits for tasks assessed                                        Exercises General Exercises - Lower Extremity Quad Sets: 10 reps;Right;Supine Short Arc Quad: 10 reps;Right;Supine Heel Slides: 10 reps;Right;Supine Straight Leg Raises: 10 reps;Right;Supine    General Comments General comments (skin integrity, edema, etc.): Dr. Veverly Fells pulled drain during session today      Pertinent Vitals/Pain Pain Assessment: 0-10 Pain Score: 6  Faces Pain Scale: Hurts even more Pain Location: R knee Pain Descriptors / Indicators: Sore;Discomfort Pain Intervention(s): Monitored during session    Home Living Family/patient expects to be discharged to:: Private residence Living Arrangements: Spouse/significant other Available Help at Discharge: Family;Available PRN/intermittently Type of Home: House Home Access: Stairs  to enter Entrance Stairs-Rails: None Home Layout: Two level Home Equipment: None Additional Comments: pt was sponge bathing due to HD port    Prior Function Level of Independence: Independent          PT Goals (current goals can now be found in the care plan section) Acute Rehab PT Goals Patient  Stated Goal: Return home PT Goal Formulation: With patient Time For Goal Achievement: 11/21/17 Potential to Achieve Goals: Good Progress towards PT goals: Progressing toward goals    Frequency    Min 5X/week      PT Plan Current plan remains appropriate    Co-evaluation              AM-PAC PT "6 Clicks" Daily Activity  Outcome Measure  Difficulty turning over in bed (including adjusting bedclothes, sheets and blankets)?: A Little Difficulty moving from lying on back to sitting on the side of the bed? : A Little Difficulty sitting down on and standing up from a chair with arms (e.g., wheelchair, bedside commode, etc,.)?: A Little Help needed moving to and from a bed to chair (including a wheelchair)?: A Little Help needed walking in hospital room?: A Little Help needed climbing 3-5 steps with a railing? : A Little 6 Click Score: 18    End of Session Equipment Utilized During Treatment: Gait belt Activity Tolerance: Patient tolerated treatment well Patient left: in chair;with call bell/phone within reach Nurse Communication: Mobility status PT Visit Diagnosis: Unsteadiness on feet (R26.81);Other abnormalities of gait and mobility (R26.89);Pain Pain - Right/Left: Right Pain - part of body: Knee     Time: 0908-0949(minus approx 5 minutes for drain pull) PT Time Calculation (min) (ACUTE ONLY): 41 min  Charges:  $Gait Training: 8-22 mins $Therapeutic Exercise: 8-22 mins                    G Codes:       Roney [place], PT  Acute Rehabilitation Services Pager [phone] Office [place] 11/08/2017, 10:10 AM

## 2017-11-08 NOTE — Progress Notes (Addendum)
Family Medicine Teaching Service Daily Progress Note Intern Pager: [phone]  Patient name: Adam [name]. Medical record number: [id number] Date of birth: [birth year] Age: 57 y.o. Gender: male  Primary Care Provider: [name], MD Consultants: orthopedic surgery, nephrology Code Status: full  Pt Overview and Major Events to Date:  Admitted to [facility], transferred to [facility] for dialysis and treatment 6/24 Received dialysis on 6/26 Approved for discharge from ortho perspective 6/27, then decision to wash out knee made from patient's home orthopedist  Knee washout with fluid cultures recollected 6/28  Assessment and Plan: Adam [name]. is a 57 y.o. male presenting with right leg pain, redness, and swelling.  His medical history includes ESRD on dialysis MWF, last dialysis on 6/24, atrial fibrillation on coumadin, moderate aortic stenosis, PAH, nonischemic cardiomyopathy with removal of ICD in April this year following MSSA bacteremia due to AV fistula infection.  R knee pain and swelling, post washout:  Suspect culture-negative septic arthritis although inflammatory arthritis also a likely possibility.  S/p washout by Ortho 6/28; drain is in place with plan to remove today.  Per ID recommendations, coverage with ceftriaxone and vancomycin- duration of therapy to be decided based on results of culture.  Pt reports he is doing well, knee pain has improved and is well controlled with tramadol.  He has remained afebrile overnight without white count.  Wound cultures with no growth so far.   Of note, wound cx from [facility] ED on 6/24 negative which was prior to antibiotics.  Will discuss with ID for further clarification.  - follow up repeat blood cultures - no growth<24 hours - follow up intraoperative fluid culture - ID recs for length of antibiotic therapy  - continue tramadol 50 mg Q6H PRN, use judiciously due to patient's hypotension  Painful subcutaneous masses:  Three painful, swollen areas on L hip, axilla, and thoracolumbar area, with a new nodule on the R flank since hospitalization.  Have grown from quarter size to grapefruit size since April and are tender to palpation.  [place] only positive for edema, no abscesses visualized.  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 - surgery consult outpatient, will need deep tissue biopsy of the subcutaneous tissue - stop warfarin, start Eliquis after patient's outpatient vascular surgery procedure   QTc prolongation: EKG on 6/29 reports a QTc interval of 545, but manually calculated, QTc is closer to normal range at 465 msec. - monitor use of zofran; no need to make medication adjustments currently  ESRD: MWF.  Takes midodrine 10 mg TID AC for hypotension and sensipar 90 mg daily at 1700.  Dr. Bridgett Larsson performed a RUE venography and determined that his right AV fistula will not be usable.  Scheduled for dialysis access 7/2.  - follow up vascular surgery recs - continue home midodrine and sensipar  Recent MSSA bacteremia: occurred in April 2019, cultured from AV fistula wound and blood.  AV fistula was ligated, and he is currently getting dialysis from a perm cath, although RUE fistula is maturing.  ICD was removed at that time given bacteremia.  A TEE was performed but results are not in system. - f/u repeat blood cultures - NG x 24 hours   Anemia of chronic disease: Hemoglobin 9.3 on 6/28, appears to be around his baseline of ~9.  MCV in normal range.  Platelets are low at  but also seem to  be at baseline. - continue to monitor   Atrial fibrillation: Regular rhythm during admission, takes amiodarone 200 mg daily and coumadin 6 mg on Sunday, 9 mg other days.  INR therapeutic at 2.05 on 6/24. - continue amiodarone - Eliquis after vascular procedure  PAH: Echo on 08/18/17 showed an PA peak pressure of 59  mmHg. Takes ambrisentan 10 mg daily and tadalafil 20 mg daily. - continue home meds   HFpEF with aortic stenosis: Echo with EF of 60-65%, dilated IVC with elevated central venous pressure, moderate aortic stenosis with mean gradient of 25 mmHg. - administer fluids cautiously if needed   HLD: takes atorvastatin 40 mg daily - continue home atorvastatin  GERD: takes protonix 40 mg daily and sucralfate 1 g BID at home - continue home meds   Hypothyroidism: Iatrogenic d/t thyroidectomy for treatment of thyroid cancer.  Takes synthroid 300 mcg daily.  Last TSH was 3.73 in Sept 2018.  TSH 0.696 on 6/25 - continue synthroid   FEN/GI: renal diet PPx: SCDs  Disposition: continued inpatient stay, requiring IV antibiotics  Subjective:  Pt feels well today - pain is now down to 4-5/10 and well controlled with tramadol.  No fevers overnight.    Objective: Temp:  [97.5 F (36.4 C)-98 F (36.7 C)] 97.9 F (36.6 C) (06/30 0523) Pulse Rate:  [69-77] 69 (06/30 0523) Resp:  [16-20] 20 (06/30 0523) BP: (68-83)/(36-56) 68/36 (06/30 0523) SpO2:  [93 %-100 %] 98 % (06/30 0523)   Physical Exam: General: 46-year-old male, NAD, sitting at side of bed Cardiovascular: RRR no MRG, murmur from AV fistula Respiratory: CTA B, normal effort Abdomen: Soft, nontender, active bowel sounds Extremities: RLE wrapped with Ace bandage, drain in place with small amount of serosanguineous fluid   Laboratory: Recent Labs  Lab 11/05/17 2013 11/06/17 0716 11/07/17 0803  WBC 5.8 6.3 5.1  HGB 10.0* 9.3* 9.0*  HCT 33.1* 30.4* 30.6*  PLT 159 143* 127*   Recent Labs  Lab 11/05/17 0518 11/06/17 0500 11/07/17 0803  NA 136 134* 137  K 3.5 3.5 3.3*  CL 99 97* 96*  CO2 27 26 28   BUN 13 21* 13  CREATININE 5.35* 6.78* 5.03*  CALCIUM 7.6* 8.1* 8.1*  GLUCOSE 71 126* 83    Imaging/Diagnostic Tests: Dg Chest 1 View  Result Date: 11/07/2017 CLINICAL DATA:  Pre-op chest exam for fistula EXAM: CHEST  1 VIEW  COMPARISON:  09/24/2017 FINDINGS: Lungs are clear. Improvement in pulmonary vascular congestion. Stable tunneled left IJ hemodialysis catheter to the proximal right atrium. Left axillary vascular stents as before. Stable cardiomegaly.  Aortic Atherosclerosis (ICD10-170.0). No effusion. Visualized bones unremarkable. IMPRESSION: Stable cardiomegaly.  No acute findings. Electronically Signed   By: Lucrezia Europe M.D.   On: 11/07/2017 08:22   [place] Chest Soft Tissue  Result Date: 11/03/2017 CLINICAL DATA:  Diffuse swelling along the LEFT back, flank, chest and axilla EXAM: ULTRASOUND OF HEAD/NECK SOFT TISSUES TECHNIQUE: Ultrasound examination of the LEFT flank, back, chest and axillary soft tissues was performed in the area of clinical concern. COMPARISON:  None FINDINGS: Diffuse subcutaneous edema along the LEFT flank, back, chest and axilla identified. No discrete abscess. No other abnormalities noted. IMPRESSION: Diffuse edema along the LEFT back, chest and axilla. No discrete abscess. Electronically Signed   By: Margarette Canada M.D.   On: 11/03/2017 18:50     Lovenia Kim, MD 11/08/2017, 9:16 AM PGY-2, [place]

## 2017-11-08 NOTE — Evaluation (Signed)
Occupational Therapy Evaluation and Discharge Patient Details Name: Adam [name]. MRN: [id number] DOB: 11/17/1960 Today's Date: 11/08/2017    History of Present Illness Pt is a 57 y.o. male admitted 11/02/17 with c/o R knee pain; suspected R knee septic arthritis. Now s/p R knee arthrotomy and drainage 6/28. 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 PMH includes ESRD (HD MWF), a-fib, HTN, cardiac debibrillator, subcutaneous nodules.   Clinical Impression   Pt is typically independent. Presents with R knee pain and decreased dynamic standing balance requiring walker use. Pt is overall functioning at a supervision level for safety with ADL. Educated in multiple uses of 3 in 1, fall prevention and how to transport items safely with RW. Pt verbalizing understanding of all information. No further OT needs.    Follow Up Recommendations  No OT follow up    Equipment Recommendations  3 in 1 bedside commode    Recommendations for Other Services       Precautions / Restrictions Precautions Precautions: Fall Precaution Comments: R knee hemovac drain-pulled 6/30 Restrictions Weight Bearing Restrictions: Yes RLE Weight Bearing: Weight bearing as tolerated      Mobility Bed Mobility               General bed mobility comments: pt received in chair  Transfers Overall transfer level: Needs assistance Equipment used: Rolling walker (2 wheeled) Transfers: Sit to/from Stand Sit to Stand: Supervision         General transfer comment: from chair and 3 in 1    Balance Overall balance assessment: Needs assistance   Sitting balance-Leahy Scale: Good       Standing balance-Leahy Scale: Fair Standing balance comment: can release walker in static standing at sink and to manage clothing                           ADL either performed or assessed with clinical judgement   ADL Overall ADL's : Needs assistance/impaired Eating/Feeding:  Independent;Sitting   Grooming: Wash/dry hands;Standing;Supervision/safety   Upper Body Bathing: Set up;Sitting   Lower Body Bathing: Supervison/ safety;Sit to/from stand   Upper Body Dressing : Set up;Sitting   Lower Body Dressing: Supervision/safety   Toilet Transfer: Supervision/safety;Ambulation;RW;BSC(over toilet)         Tub/Shower Transfer Details (indicate cue type and reason): educated pt in use of 3 in 1 as shower seat when allowed to shower Functional mobility during ADLs: Supervision/safety;Rolling walker General ADL Comments: Educated in use of 3 in 1 over toilet to elevate, pt heavily reliant on UEs to push to stand.     Vision Baseline Vision/History: Wears glasses Wears Glasses: At all times Patient Visual Report: No change from baseline       Perception     Praxis      Pertinent Vitals/Pain Pain Assessment: Faces Faces Pain Scale: Hurts even more Pain Location: R knee Pain Descriptors / Indicators: Sore;Discomfort Pain Intervention(s): Repositioned;Monitored during session;RN gave pain meds during session     Hand Dominance Right   Extremity/Trunk Assessment Upper Extremity Assessment Upper Extremity Assessment: Overall WFL for tasks assessed   Lower Extremity Assessment Lower Extremity Assessment: Defer to PT evaluation   Cervical / Trunk Assessment Cervical / Trunk Assessment: Normal   Communication Communication Communication: No difficulties   Cognition Arousal/Alertness: Awake/alert Behavior During Therapy: WFL for tasks assessed/performed Overall Cognitive Status: Within Functional Limits for tasks assessed  General Comments  Dr. Veverly [name] pulled drain during session today    Exercises     Shoulder Instructions      Home Living Family/patient expects to be discharged to:: Private residence Living Arrangements: Spouse/significant other Available Help at Discharge:  Family;Available PRN/intermittently Type of Home: House Home Access: Stairs to enter [employer] of Steps: 1 threshold Entrance Stairs-Rails: None Home Layout: Two level Alternate Level Stairs-Number of Steps: flight Alternate Level Stairs-Rails: Right Bathroom Shower/Tub: Teacher, early years/pre: Standard     Home Equipment: None   Additional Comments: pt was sponge bathing due to HD port      Prior Functioning/Environment Level of Independence: Independent                 OT Problem List: Impaired balance (sitting and/or standing)      OT Treatment/Interventions:      OT Goals(Current goals can be found in the care plan section) Acute Rehab OT Goals Patient Stated Goal: Return home  OT Frequency:     Barriers to D/C:            Co-evaluation              AM-PAC PT "6 Clicks" Daily Activity     Outcome Measure Help from another person eating meals?: None Help from another person taking care of personal grooming?: A Little Help from another person toileting, which includes using toliet, bedpan, or urinal?: A Little Help from another person bathing (including washing, rinsing, drying)?: A Little Help from another person to put on and taking off regular upper body clothing?: None Help from another person to put on and taking off regular lower body clothing?: A Little 6 Click Score: 20   End of Session Equipment Utilized During Treatment: Rolling walker  Activity Tolerance: Patient tolerated treatment well Patient left: in chair;with call bell/phone within reach  OT Visit Diagnosis: Other abnormalities of gait and mobility (R26.89);Pain                Time: 4034-7425 OT Time Calculation (min): 24 min Charges:  OT General Charges $OT Visit: 1 Visit OT Evaluation $OT Eval Moderate Complexity: 1 Mod G-Codes:     11-22-17 Adam [name], OTR/L Pager: [phone]  Adam [name], Adam [name] 11/22/17, 9:33 AM

## 2017-11-09 LAB — CBC
HEMATOCRIT: 28.4 % — AB (ref 39.0–52.0)
HEMOGLOBIN: 8.7 g/dL — AB (ref 13.0–17.0)
MCH: 27.7 pg (ref 26.0–34.0)
MCHC: 30.6 g/dL (ref 30.0–36.0)
MCV: 90.4 fL (ref 78.0–100.0)
Platelets: 124 10*3/uL — ABNORMAL LOW (ref 150–400)
RBC: 3.14 MIL/uL — AB (ref 4.22–5.81)
RDW: 18.2 % — ABNORMAL HIGH (ref 11.5–15.5)
WBC: 4.7 10*3/uL (ref 4.0–10.5)

## 2017-11-09 LAB — BASIC METABOLIC PANEL
Anion gap: 14 (ref 5–15)
BUN: 23 mg/dL — ABNORMAL HIGH (ref 6–20)
CHLORIDE: 96 mmol/L — AB (ref 98–111)
CO2: 24 mmol/L (ref 22–32)
Calcium: 7.7 mg/dL — ABNORMAL LOW (ref 8.9–10.3)
Creatinine, Ser: 7.49 mg/dL — ABNORMAL HIGH (ref 0.61–1.24)
GFR calc Af Amer: 8 mL/min — ABNORMAL LOW (ref >60)
GFR calc non Af Amer: 7 mL/min — ABNORMAL LOW (ref >60)
Glucose, Bld: 74 mg/dL (ref 70–99)
POTASSIUM: 3.4 mmol/L — AB (ref 3.5–5.1)
SODIUM: 134 mmol/L — AB (ref 135–145)

## 2017-11-09 LAB — PROTIME-INR
INR: 1.85
PROTHROMBIN TIME: 21.1 s — AB (ref 11.4–15.2)

## 2017-11-09 MED ORDER — MIDODRINE HCL 5 MG PO TABS
ORAL_TABLET | ORAL | Status: AC
  Administered 2017-11-09: 5 mg via ORAL
  Filled 2017-11-09: qty 2

## 2017-11-09 MED ORDER — VANCOMYCIN HCL IN DEXTROSE 1-5 GM/200ML-% IV SOLN
INTRAVENOUS | Status: AC
  Administered 2017-11-09: 1000 mg via INTRAVENOUS
  Filled 2017-11-09: qty 200

## 2017-11-09 MED ORDER — CEFAZOLIN SODIUM-DEXTROSE 2-4 GM/100ML-% IV SOLN
2.0000 g | INTRAVENOUS | Status: DC
Start: 2017-11-09 — End: 2017-11-12
  Administered 2017-11-09: 2 g via INTRAVENOUS
  Filled 2017-11-09 (×2): qty 100

## 2017-11-09 NOTE — Care Management Important Message (Signed)
Important Message  Patient Details  Name: Adam [name]. MRN: [id number] Date of Birth: [birth year]   Medicare Important Message Given:  Yes    Areon Cocuzza 11/09/2017, 4:59 PM

## 2017-11-09 NOTE — Progress Notes (Signed)
Patient ID: Adam Alm., male   DOB: [birth year], 57 y.o.   MRN: [id number] [facility] Progress Note   Assessment/ Plan:   1. Right kneepain /effusion: Gram stain with >45000 WBC, negative blood and synovial fluid cultures with empiric intravenous vancomycin.  Status post surgical washout of right knee joint space and currently ongoing physical therapy. 2. ESRD:Continue hemodialysis on a Monday/Wednesday/Friday schedule with right IJ TDC.  Plans for HeRO graft placement likely tomorrow to overcome problems with central venous stenosis seen on venogram done earlier.  3. Hypotension/volume:Chronic low BP, on midodrine.  Currently under his dry weight-we will change during transition to outpatient hemodialysis.  4. Anemia:Small downtrend of hemoglobin noted, monitor with ESA/intravenous iron. 5. Metabolic bone disease:Phosphorus and corrected calcium both at goal, continue home sensipar and calcium acetate. 6. Atrial fibrillation:Warfarin on hold in case surgery needed.Per primary 7. PAH 8.  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 Also takes warfarin.  Recommend deep biopsy of the nodules If + for calcifications, will need to change warfarin to Eliquis amongst various other changes  Subjective:   Reports to be feeling fair and complains about being on a carb modified diet when he is not diabetic   Objective:   BP (!) 71/39 (BP Location: Left Arm)   Pulse 84   Temp 99.1 F (37.3 C) (Oral)   Resp 18   Ht 6\' 1"  (1.854 m)   Wt 122.2 kg (269 lb 6.4 oz)   SpO2 95%   BMI 35.54 kg/m   Physical Exam: Gen: Comfortable resting on hemodialysis CVS: Pulse regular rhythm, normal rate, S1 and S2 normal Resp: Clear to auscultation, no rales/rhonchi Abd: Soft, obese, nontender Ext: Right leg in Ace wrap  Labs: BMET Recent Labs  Lab 11/03/17 0031 11/04/17 0505 11/05/17 0518 11/06/17 0500 11/07/17 0803  11/09/17 0432  NA 138 136 136 134* 137 134*  K 3.2* 3.5 3.5 3.5 3.3* 3.4*  CL 97* 98 99 97* 96* 96*  CO2 29 25 27 26 28 24   GLUCOSE 70 55* 71 126* 83 74  BUN 12 19 13  21* 13 23*  CREATININE 5.20* 7.02* 5.35* 6.78* 5.03* 7.49*  CALCIUM 8.2* 8.0* 7.6* 8.1* 8.1* 7.7*  PHOS  --  3.3  --  2.7  --   --    CBC Recent Labs  Lab 11/03/17 0031  11/05/17 2013 11/06/17 0716 11/07/17 0803 11/09/17 0432  WBC 4.9   < > 5.8 6.3 5.1 4.7  NEUTROABS 4.1  --   --   --   --   --   HGB 9.6*   < > 10.0* 9.3* 9.0* 8.7*  HCT 31.4*   < > 33.1* 30.4* 30.6* 28.4*  MCV 92.9   < > 92.7 91.3 93.6 90.4  PLT 115*   < > 159 143* 127* 124*   < > = values in this interval not displayed.   Medications:    . ambrisentan  10 mg Oral Daily  . amiodarone  200 mg Oral QPM  . atorvastatin  40 mg Oral QPM  . calcium acetate  1,334 mg Oral TID WC  . Chlorhexidine Gluconate Cloth  6 each Topical Q0600  . Chlorhexidine Gluconate Cloth  6 each Topical Q0600  . cinacalcet  90 mg Oral QAC supper  . docusate sodium  100 mg Oral BID  . levothyroxine  300 mcg Oral QAC breakfast  . midodrine  10 mg Oral TID WC  . multivitamin  1 tablet Oral QHS  . pantoprazole  40 mg Oral BID AC  . sucralfate  1 g Oral BID  . tadalafil  20 mg Oral Daily   Elmarie Shiley, MD 11/09/2017, 10:49 AM

## 2017-11-09 NOTE — Progress Notes (Signed)
Physical Therapy Treatment Patient Details Name: Adam [name]. MRN: [id number] DOB: 1960/05/21 Today's Date: 11/09/2017    History of Present Illness Pt is a 57 y.o. male admitted 11/02/17 with c/o R knee pain; suspected R knee septic arthritis. Now s/p R knee arthrotomy and drainage 6/28. 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 PMH includes ESRD (HD MWF), a-fib, HTN, cardiac debibrillator, subcutaneous nodules.    PT Comments    Continuing work on functional mobility and activity tolerance;  Exhausted post HD today, but willing to work on therex and get up and out of bed; Provided pt with handout with knee exercises   Follow Up Recommendations  Home health PT;Supervision - Intermittent     Equipment Recommendations  Rolling walker with 5" wheels;3in1 (PT)    Recommendations for Other Services       Precautions / Restrictions Precautions Precautions: Fall Restrictions RLE Weight Bearing: Weight bearing as tolerated    Mobility  Bed Mobility Overal bed mobility: Needs Assistance Bed Mobility: Supine to Sit     Supine to sit: Min assist     General bed mobility comments: min assist to help RLE clear bed  Transfers Overall transfer level: Needs assistance Equipment used: Rolling walker (2 wheeled) Transfers: Sit to/from Stand Sit to Stand: Supervision         General transfer comment: Cues for hand placement  Ambulation/Gait Ambulation/Gait assistance: Min guard Gait Distance (Feet): 20 Feet Assistive device: Rolling walker (2 wheeled) Gait Pattern/deviations: Step-through pattern;Decreased stride length;Decreased weight shift to right Gait velocity: Decreased   General Gait Details: Less distance today as Mr. Pantaleo was feeling exhausted from HD   Stairs             Wheelchair Mobility    Modified Rankin (Stroke Patients Only)       Balance                                            Cognition  Arousal/Alertness: Awake/alert Behavior During Therapy: WFL for tasks assessed/performed Overall Cognitive Status: Within Functional Limits for tasks assessed                                        Exercises General Exercises - Lower Extremity Quad Sets: Right;Supine;20 reps Short Arc QuadSinclair Ship;Right;10 reps Heel Slides: AAROM;Right;10 reps;Supine Straight Leg Raises: AAROM;Right;10 reps    General Comments        Pertinent Vitals/Pain Pain Assessment: 0-10 Pain Score: 6  Faces Pain Scale: Hurts even more Pain Location: R knee; also reports stiffness in back Pain Descriptors / Indicators: Sore;Discomfort Pain Intervention(s): Monitored during session    Home Living                      Prior Function            PT Goals (current goals can now be found in the care plan section) Acute Rehab PT Goals Patient Stated Goal: Return home PT Goal Formulation: With patient Time For Goal Achievement: 11/21/17 Potential to Achieve Goals: Good Progress towards PT goals: Not progressing toward goals - comment(Exhausted post HD)    Frequency    Min 5X/week      PT Plan Current plan remains appropriate  Co-evaluation              AM-PAC PT "6 Clicks" Daily Activity  Outcome Measure  Difficulty turning over in bed (including adjusting bedclothes, sheets and blankets)?: A Little Difficulty moving from lying on back to sitting on the side of the bed? : A Little Difficulty sitting down on and standing up from a chair with arms (e.g., wheelchair, bedside commode, etc,.)?: A Little Help needed moving to and from a bed to chair (including a wheelchair)?: A Little Help needed walking in hospital room?: A Little Help needed climbing 3-5 steps with a railing? : A Little 6 Click Score: 18    End of Session   Activity Tolerance: Patient tolerated treatment well Patient left: in chair;with call bell/phone within reach Nurse Communication:  Mobility status PT Visit Diagnosis: Unsteadiness on feet (R26.81);Other abnormalities of gait and mobility (R26.89);Pain Pain - Right/Left: Right Pain - part of body: Knee     Time: 1533-1617(minus approx 10 min while pt on commode) PT Time Calculation (min) (ACUTE ONLY): 44 min  Charges:  $Therapeutic Exercise: 8-22 mins $Therapeutic Activity: 8-22 mins                    G Codes:       Roney Marion, PT  Acute Rehabilitation Services Pager [phone] Office [phone]    Colletta [place] 11/09/2017, 4:41 PM

## 2017-11-09 NOTE — Anesthesia Preprocedure Evaluation (Addendum)
Anesthesia Evaluation  Patient identified by MRN, date of birth, ID band Patient awake    Reviewed: Allergy & Precautions, NPO status , Patient's Chart, lab work & pertinent test results  History of Anesthesia Complications (+) PONV  Airway Mallampati: II  TM Distance: >3 FB Neck ROM: Full    Dental no notable dental hx. (+) Teeth Intact, Caps   Pulmonary pneumonia,    Pulmonary exam normal breath sounds clear to auscultation       Cardiovascular hypertension, + Peripheral Vascular Disease  Normal cardiovascular exam+ Cardiac Defibrillator  Rhythm:Regular Rate:Normal     Neuro/Psych    GI/Hepatic negative GI ROS, Neg liver ROS,   Endo/Other  Hypothyroidism   Renal/GU Renal disease     Musculoskeletal   Abdominal (+) + obese,   Peds  Hematology  (+) anemia ,   Anesthesia Other Findings   Reproductive/Obstetrics                             Lab Results  Component Value Date   WBC 4.7 11/09/2017   HGB 8.7 (L) 11/09/2017   HCT 28.4 (L) 11/09/2017   MCV 90.4 11/09/2017   PLT 124 (L) 11/09/2017    Anesthesia Physical Anesthesia Plan  ASA: IV  Anesthesia Plan: General   Post-op Pain Management:    Induction: Intravenous  PONV Risk Score and Plan: 3 and Treatment may vary due to age or medical condition, Ondansetron, Dexamethasone and Scopolamine patch - Pre-op  Airway Management Planned: LMA  Additional Equipment:   Intra-op Plan:   Post-operative Plan:   Informed Consent: I have reviewed the patients History and Physical, chart, labs and discussed the procedure including the risks, benefits and alternatives for the proposed anesthesia with the patient or authorized representative who has indicated his/her understanding and acceptance.     Plan Discussed with: CRNA  Anesthesia Plan Comments:         Anesthesia Quick Evaluation

## 2017-11-09 NOTE — Procedures (Signed)
Patient seen on Hemodialysis. QB 400, UF goal 1.5L Treatment adjusted as needed.  Adam Shiley MD [facility]. Office # [phone] Pager # [phone] 10:54 AM

## 2017-11-09 NOTE — Progress Notes (Addendum)
Family Medicine Teaching Service Daily Progress Note Intern Pager: [phone]  Patient name: Adam [name]. Medical record number: [id number] Date of birth: 01/28/1961 Age: 57 y.o. Gender: male  Primary Care Provider: [name], MD Consultants: orthopedic surgery, nephrology Code Status: full  Pt Overview and Major Events to Date:  Admitted to [facility] at [facility], transferred to [facility] for dialysis and treatment 6/24 Received dialysis on 6/26 Approved for discharge from ortho perspective 6/27, then decision to wash out knee made from patient's home orthopedist  Knee washout with fluid cultures recollected 6/28 Drain removed from joint 6/30  Assessment and Plan: Emrah Ariola. is a 57 y.o. male presenting with right leg pain, redness, and swelling.  His medical history includes ESRD on dialysis MWF, last dialysis on 6/24, atrial fibrillation on coumadin, moderate aortic stenosis, PAH, nonischemic cardiomyopathy with removal of ICD in April this year following MSSA bacteremia due to AV fistula infection.  R knee pain and swelling, post washout:  Suspect culture-negative septic arthritis although inflammatory arthritis also a likely possibility.  S/p washout by Ortho 6/28; drain removed 6/30.  Per ID recommendations, coverage with ceftriaxone and vancomycin- duration of therapy to be decided based on results of culture.  Pt notes improving knee pain that is concentrated over incision site.  He has remained afebrile overnight without white count.  Wound cultures with no growth so far.   Of note, wound cx from [facility] ED on 6/24 negative which was prior to antibiotics.  Will discuss with ID for further clarification.  - follow up repeat blood cultures - no growth 3 days - follow up intraoperative fluid culture - ID recs for length of antibiotic therapy  - continue tramadol 50 mg Q6H PRN, use judiciously due to patient's hypotension  Painful subcutaneous masses: Three painful,  swollen areas on L hip, axilla, and thoracolumbar area, with a new nodule on the R flank since hospitalization.  Denies new nodule since Friday.  Have grown from quarter size to grapefruit size since April and are tender to palpation.  [place] only positive for edema, no abscesses visualized.  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 - surgery consult outpatient, will need deep tissue biopsy of the subcutaneous tissue - stop warfarin, start Eliquis after patient's outpatient vascular surgery procedure   QTc prolongation: EKG on 6/29 reports a QTc interval of 545, but manually calculated, QTc is closer to normal range at 465 msec. - monitor use of zofran; no need to make medication adjustments currently  ESRD: MWF.  Takes midodrine 10 mg TID AC for hypotension and sensipar 90 mg daily at 1700.  Patient's BP 58/31 this AM upon examination post midodrine administration, corrected to 89/35 after examination completed.  Patient was asymptomatic during this time and reported 10 year history of hypotension.  Dr. Bridgett Larsson performed a RUE venography and determined that his right AV fistula will not be usable.  Scheduled for dialysis access 7/2.  - NPO after midnight for procedure tomorrow - follow up vascular surgery recs - continue home midodrine and sensipar  Recent MSSA bacteremia: occurred in April 2019, cultured from AV fistula wound and blood.  AV fistula was ligated, and he is currently getting dialysis from a perm cath, although RUE fistula is maturing.  ICD was removed at that time given bacteremia.  A TEE was performed but results are not in system. - f/u repeat blood  cultures - NG x 4 days  Anemia of chronic disease: Hemoglobin 8.7 on 7/1, appears to be around his baseline of ~9.  MCV in normal range.  Platelets are low at but also seem to be at baseline. - continue to monitor   Atrial  fibrillation: Regular rhythm during admission, takes amiodarone 200 mg daily and coumadin 6 mg on Sunday, 9 mg other days.  INR sub-therapeutic at 1.85 on 7/1.  Will re-evaluate following vascular procedure. - continue amiodarone - Eliquis after vascular procedure  PAH: Echo on 08/18/17 showed an PA peak pressure of 59 mmHg. Takes ambrisentan 10 mg daily and tadalafil 20 mg daily. - continue home meds   HFpEF with aortic stenosis: Echo with EF of 60-65%, dilated IVC with elevated central venous pressure, moderate aortic stenosis with mean gradient of 25 mmHg. - administer fluids cautiously if needed   HLD: takes atorvastatin 40 mg daily - continue home atorvastatin  GERD: takes protonix 40 mg daily and sucralfate 1 g BID at home - continue home meds   Hypothyroidism: Iatrogenic d/t thyroidectomy for treatment of thyroid cancer.  Takes synthroid 300 mcg daily.  Last TSH was 3.73 in Sept 2018.  TSH 0.696 on 6/25 - continue synthroid   FEN/GI: renal diet PPx: SCDs  Disposition: continued inpatient stay, requiring IV antibiotics  Subjective:  Pt states knee pain is improved today- pain is now down to 4-5/10 and well controlled with tramadol.  No fevers overnight.  Improving ROM per patient.  Objective: Temp:  [97.6 F (36.4 C)-99.1 F (37.3 C)] 97.9 F (36.6 C) (07/01 1317) Pulse Rate:  [59-89] 85 (07/01 1317) Resp:  [15-18] 16 (07/01 1317) BP: (71-117)/(30-67) 102/50 (07/01 1317) SpO2:  [95 %-100 %] 98 % (07/01 1317) Weight:  [121.9 kg (268 lb 11.9 oz)-122.2 kg (269 lb 6.4 oz)] 121.9 kg (268 lb 11.9 oz) (07/01 1317)   Physical Exam: General: 57 year old male in no acute distress, laying in bed Cardiovascular: RRR no MRG, murmur from AV fistula Respiratory: CTAB, normal effort, no wheeze, rhonchi, crackles Abdomen: Soft, nontender, active bowel sounds, umbilical hernia palpated and non-tender, stable per patient Extremities: RLE wrapped with Ace bandage  Laboratory: Recent  Labs  Lab 11/06/17 0716 11/07/17 0803 11/09/17 0432  WBC 6.3 5.1 4.7  HGB 9.3* 9.0* 8.7*  HCT 30.4* 30.6* 28.4*  PLT 143* 127* 124*   Recent Labs  Lab 11/06/17 0500 11/07/17 0803 11/09/17 0432  NA 134* 137 134*  K 3.5 3.3* 3.4*  CL 97* 96* 96*  CO2 26 28 24   BUN 21* 13 23*  CREATININE 6.78* 5.03* 7.49*  CALCIUM 8.1* 8.1* 7.7*  GLUCOSE 126* 83 74    Imaging/Diagnostic Tests: Dg Chest 1 View  Result Date: 11/07/2017 CLINICAL DATA:  Pre-op chest exam for fistula EXAM: CHEST  1 VIEW COMPARISON:  09/24/2017 FINDINGS: Lungs are clear. Improvement in pulmonary vascular congestion. Stable tunneled left IJ hemodialysis catheter to the proximal right atrium. Left axillary vascular stents as before. Stable cardiomegaly.  Aortic Atherosclerosis (ICD10-170.0). No effusion. Visualized bones unremarkable. IMPRESSION: Stable cardiomegaly.  No acute findings. Electronically Signed   By: Lucrezia Europe M.D.   On: 11/07/2017 08:22   [place] Chest Soft Tissue  Result Date: 11/03/2017 CLINICAL DATA:  Diffuse swelling along the LEFT back, flank, chest and axilla EXAM: ULTRASOUND OF HEAD/NECK SOFT TISSUES TECHNIQUE: Ultrasound examination of the LEFT flank, back, chest and axillary soft tissues was performed in the area of clinical concern. COMPARISON:  None FINDINGS: Diffuse subcutaneous  edema along the LEFT flank, back, chest and axilla identified. No discrete abscess. No other abnormalities noted. IMPRESSION: Diffuse edema along the LEFT back, chest and axilla. No discrete abscess. Electronically Signed   By: Margarette Canada M.D.   On: 11/03/2017 18:50     Rittberger, Bernita Raisin, DO 11/09/2017, 3:50 PM PGY-1, [place]

## 2017-11-09 NOTE — Progress Notes (Addendum)
[place] [facility]  Date of Admission:  11/02/2017   Total days of antibiotics 4  Cefazolin      Day 1 Ceftriaxone      Day 3 Vancomycin      Day 4  ASSESSMENT: Patient is a 57 year old male with a PMHx significant for ESRD on hemodialysis MWF, atrial fibrillation on coumadin, moderate AS, NICM s/p defibrillator removed in April following MSSA bacteremia and MSSA fistula infection presenting with right knee pain on 11/02/17. Pt has received cefazolin x1, ceftriaxone x3, and vancomycin x5. His blood cultures and acid fast smear have shown no growth. However, he still had an irrigation and debridement of the right knee on 11/06/17 due to history of bacteremia. Patient is afebrile with a WBC count of 4.7 on post-op day one. Recommending cefazolin in addition to vancomycin for better MSSA coverage.   PLAN: 1. Continue course cefazolin and vancomycin for 4 weeks 2. Continue to monitor cultures   Diagnosis: Septic Arthritis of right knee  Culture Result: culture negative  Allergies  Allergen Reactions  . Enalaprilat Swelling and Other (See Comments)    Mouth swelling  . Vasotec [Enalapril] Swelling and Other (See Comments)    MOUTH SWELLING  . Iodinated Diagnostic Agents Nausea And Vomiting and Nausea Only    OPAT Orders Discharge antibiotics: cefazolin and vancomycin dosed with hemodialysis  Per pharmacy protocol  Aim for Vancomycin trough 15-20 (unless otherwise indicated) Duration: 4 weeks End Date: 12/07/17  [facility] Care Per Protocol:  Labs weekly while on IV antibiotics: _X_ CBC with differential __ BMP _X_ CMP __ CRP __ ESR _X_ Vancomycin trough  __ Please pull PIC at completion of IV antibiotics __ Please leave PIC in place until doctor has seen patient or been notified  Fax weekly labs to [phone]  Clinic Follow Up Appt: 4 weeks    Principal Problem:   Septic arthritis of knee, right ([place]) Active Problems:   Atrial  fibrillation [I48.91]   Pulmonary hypertension ([place])   ESRD (end stage renal disease) on hemodialysis   Infected defibrillator ([place])   Dialysis AV fistula infection, sequela   Malnutrition of moderate degree   Abscess   Acute pain of right knee   Calciphylaxis   Scheduled Meds: . ambrisentan  10 mg Oral Daily  . amiodarone  200 mg Oral QPM  . atorvastatin  40 mg Oral QPM  . calcium acetate  1,334 mg Oral TID WC  . Chlorhexidine Gluconate Cloth  6 each Topical Q0600  . Chlorhexidine Gluconate Cloth  6 each Topical Q0600  . cinacalcet  90 mg Oral QAC supper  . docusate sodium  100 mg Oral BID  . levothyroxine  300 mcg Oral QAC breakfast  . midodrine  10 mg Oral TID WC  . multivitamin  1 tablet Oral QHS  . pantoprazole  40 mg Oral BID AC  . sucralfate  1 g Oral BID  . tadalafil  20 mg Oral Daily   Continuous Infusions: . sodium chloride 10 mL/hr at 11/06/17 1548  . sodium chloride    .  ceFAZolin (ANCEF) IV    . vancomycin Stopped (11/06/17 2150)   PRN Meds:.acetaminophen **OR** acetaminophen, metoCLOPramide **OR** metoCLOPramide (REGLAN) injection, ondansetron **OR** ondansetron (ZOFRAN) IV, traMADol   SUBJECTIVE: Patient is a 57 year old male with a PMHx significant for ESRD on hemodialysis MWF, atrial fibrillation on coumadin, moderate AS, NICM s/p defibrillator removed in April following  MSSA bacteremia due to AV fistula infection presenting with right knee pain on 11/02/17. He has been treated as a culture negative septic arthritis with vancomycin and cefazolin for MSSA coverage. Pt had an irrigation and debridement of his right knee on 11/08/17. He is post operative day one, afebrile with a WBC of 4.7.   Pt was seen and evaluated at bedside during hemodialysis treatment with no new complaints. Pt has felt an improvement and been ambulating on the right knee during physical therapy. He did not report any pain besides at the incision site from the irrigation and debridement.    Review of Systems: Review of Systems  Constitutional: Negative for chills and fever.  Respiratory: Negative for shortness of breath.   Musculoskeletal: Negative for joint pain and myalgias.  Skin:       Tenderness at incision site of right knee  Neurological: Negative for headaches.    Allergies  Allergen Reactions  . Enalaprilat Swelling and Other (See Comments)    Mouth swelling  . Vasotec [Enalapril] Swelling and Other (See Comments)    MOUTH SWELLING  . Iodinated Diagnostic Agents Nausea And Vomiting and Nausea Only    OBJECTIVE: Vitals:   11/09/17 0429 11/09/17 0900 11/09/17 0917 11/09/17 0925  BP: (!) 71/39 (!) 80/45 (!) 94/40 (!) 96/45  Pulse: 84 80 66 68  Resp: '18 16 16 15  '$ Temp: 99.1 F (37.3 C) 99 F (37.2 C) 98.3 F (36.8 C)   TempSrc: Oral Oral Oral   SpO2: 95% 96% 98% 98%  Weight:  122.2 kg (269 lb 6.4 oz)    Height:       Body mass index is 35.54 kg/m.  Physical Exam  Constitutional: He appears well-developed and well-nourished.  Cardiovascular: Normal rate, regular rhythm and normal heart sounds.  Pulmonary/Chest: Effort normal and breath sounds normal.  Psychiatric: He has a normal mood and affect. His behavior is normal.    Lab Results Lab Results  Component Value Date   WBC 4.7 11/09/2017   HGB 8.7 (L) 11/09/2017   HCT 28.4 (L) 11/09/2017   MCV 90.4 11/09/2017   PLT 124 (L) 11/09/2017    Lab Results  Component Value Date   CREATININE 7.49 (H) 11/09/2017   BUN 23 (H) 11/09/2017   NA 134 (L) 11/09/2017   K 3.4 (L) 11/09/2017   CL 96 (L) 11/09/2017   CO2 24 11/09/2017    Lab Results  Component Value Date   ALT <5 (L) 09/23/2017   AST 18 09/23/2017   ALKPHOS 107 09/23/2017   BILITOT 0.5 09/23/2017     Microbiology: Recent Results (from the past 240 hour(s))  Clostridium Difficile by PCR([facility])     Status: None   Collection Time: 10/30/17 12:03 PM  Result Value Ref Range Status   Toxigenic C. Difficile by PCR  Negative Negative Final  Body fluid culture     Status: None   Collection Time: 11/02/17  8:58 PM  Result Value Ref Range Status   Specimen Description   Final    KNEE RIGHT Performed at [place] [address] [place], [place] [address]    Special Requests   Final    Normal Performed at [facility], [place] [address], [place], [place] [address]    Gram Stain   Final    MODERATE WBC PRESENT, PREDOMINANTLY PMN NO ORGANISMS SEEN Gram Stain Report Called to,Read Back By and Verified With: [facility] RN AT 3716 11/02/17 BY TIBBITTS,K Performed  at [facility], [place] [address] [place], [place] [address]    Culture   Final    NO GROWTH 3 DAYS Performed at [facility], [place] [address], [place], [place] [address]    Report Status 11/06/2017 FINAL  Final  Culture, blood (routine x 2)     Status: None   Collection Time: 11/03/17 12:32 AM  Result Value Ref Range Status   Specimen Description   Final    BLOOD LEFT FOREARM Performed at [place] [address] [place], [place] [address]    Special Requests   Final    BOTTLES DRAWN AEROBIC AND ANAEROBIC Blood Culture adequate volume Performed at [place] [address], [place], [place] [address]    Culture   Final    NO GROWTH 5 DAYS Performed at [facility], [place] [address], [place], [place] [address]    Report Status 11/08/2017 FINAL  Final  MRSA PCR Screening     Status: None   Collection Time: 11/03/17  6:45 AM  Result Value Ref Range Status   MRSA by PCR NEGATIVE NEGATIVE Final    Comment:        The GeneXpert MRSA Assay (FDA approved for NASAL specimens only), is one component of a comprehensive MRSA colonization surveillance program. It is not intended to diagnose MRSA infection nor to guide or monitor treatment for MRSA infections. Performed at [facility], [place] [address], [place], [place]  [address]   Culture, blood (routine x 2)     Status: None (Preliminary result)   Collection Time: 11/05/17  7:17 PM  Result Value Ref Range Status   Specimen Description BLOOD LEFT HAND  Final   Special Requests   Final    BOTTLES DRAWN AEROBIC AND ANAEROBIC Blood Culture adequate volume   Culture   Final    NO GROWTH 3 DAYS Performed at [facility], [place] [address], [place], [place] [address]    Report Status PENDING  Incomplete  Culture, blood (routine x 2)     Status: None (Preliminary result)   Collection Time: 11/05/17  7:19 PM  Result Value Ref Range Status   Specimen Description BLOOD LEFT FOREARM  Final   Special Requests   Final    BOTTLES DRAWN AEROBIC ONLY Blood Culture adequate volume   Culture   Final    NO GROWTH 3 DAYS Performed at [facility], [address] [place], [place] [address]    Report Status PENDING  Incomplete  Aerobic/Anaerobic Culture (surgical/deep wound)     Status: None (Preliminary result)   Collection Time: 11/06/17  5:11 PM  Result Value Ref Range Status   Specimen Description TISSUE  Final   Special Requests NONE  Final   Gram Stain   Final    ABUNDANT WBC PRESENT,BOTH PMN AND MONONUCLEAR NO ORGANISMS SEEN    Culture   Final    NO GROWTH 2 DAYS NO ANAEROBES ISOLATED; CULTURE IN PROGRESS FOR 5 DAYS Performed at [facility], [place] [address] [place],  [address]    Report Status PENDING  Incomplete  Acid Fast Smear (AFB)     Status: None   Collection Time: 11/06/17  5:11 PM  Result Value Ref Range Status   AFB Specimen Processing Comment  Final    Comment: Tissue Grinding and Digestion/Decontamination   Acid Fast Smear Negative  Final    Comment: (NOTE)  Performed At: [facility] [place], [place] [id number] Rush Farmer MD YN:[id number]    Source (AFB) TISSUE  Final  Aerobic/Anaerobic Culture (surgical/deep wound)     Status: None (Preliminary result)   Collection Time: 11/06/17  5:16  PM  Result Value Ref Range Status   Specimen Description TISSUE  Final   Special Requests NONE  Final   Gram Stain   Final    RARE WBC PRESENT,BOTH PMN AND MONONUCLEAR NO ORGANISMS SEEN    Culture   Final    NO GROWTH 2 DAYS NO ANAEROBES ISOLATED; CULTURE IN PROGRESS FOR 5 DAYS Performed at [facility], [address], [place], [place] [address]    Report Status PENDING  Incomplete  Acid Fast Smear (AFB)     Status: None   Collection Time: 11/06/17  5:16 PM  Result Value Ref Range Status   AFB Specimen Processing Concentration  Final   Acid Fast Smear Negative  Final    Comment: (NOTE) Performed At: [facility] [place], [place] [id number] Rush Farmer MD QL:[id number]    Source (AFB) TISSUE  Final  Surgical pcr screen     Status: None   Collection Time: 11/07/17 12:28 PM  Result Value Ref Range Status   MRSA, PCR NEGATIVE NEGATIVE Final   Staphylococcus aureus NEGATIVE NEGATIVE Final    Comment: (NOTE) The Xpert SA Assay (FDA approved for NASAL specimens in patients 86 years of age and older), is one component of a comprehensive surveillance program. It is not intended to diagnose infection nor to guide or monitor treatment. Performed at [facility], [place] [address] [place], [place] [address]     Mike Craze, DO Resident Physician [place] [facility] 11/09/2017, 11:29 AM

## 2017-11-10 ENCOUNTER — Encounter (HOSPITAL_COMMUNITY)
Admission: EM | Disposition: A | Discharge status: Home Health | Payer: Self-pay | Source: Home / Self Care | Attending: Family Medicine

## 2017-11-10 ENCOUNTER — Inpatient Hospital Stay (HOSPITAL_COMMUNITY): Payer: Medicare HMO

## 2017-11-10 ENCOUNTER — Inpatient Hospital Stay (HOSPITAL_COMMUNITY): Payer: Medicare HMO | Admitting: Anesthesiology

## 2017-11-10 ENCOUNTER — Encounter (HOSPITAL_COMMUNITY): Payer: Self-pay | Admitting: Certified Registered"

## 2017-11-10 DIAGNOSIS — N185 Chronic kidney disease, stage 5: Secondary | ICD-10-CM

## 2017-11-10 DIAGNOSIS — T82898A Other specified complication of vascular prosthetic devices, implants and grafts, initial encounter: Secondary | ICD-10-CM

## 2017-11-10 HISTORY — PX: LIGATION OF ARTERIOVENOUS  FISTULA: SHX5948

## 2017-11-10 HISTORY — PX: VASCULAR ACCESS DEVICE INSERTION: SHX5158

## 2017-11-10 LAB — PROTIME-INR
INR: 1.73
PROTHROMBIN TIME: 20.1 s — AB (ref 11.4–15.2)

## 2017-11-10 LAB — BASIC METABOLIC PANEL
ANION GAP: 14 (ref 5–15)
BUN: 16 mg/dL (ref 6–20)
CALCIUM: 7.8 mg/dL — AB (ref 8.9–10.3)
CHLORIDE: 97 mmol/L — AB (ref 98–111)
CO2: 25 mmol/L (ref 22–32)
Creatinine, Ser: 5.94 mg/dL — ABNORMAL HIGH (ref 0.61–1.24)
GFR calc Af Amer: 11 mL/min — ABNORMAL LOW (ref >60)
GFR calc non Af Amer: 10 mL/min — ABNORMAL LOW (ref >60)
GLUCOSE: 82 mg/dL (ref 70–99)
Potassium: 3.5 mmol/L (ref 3.5–5.1)
Sodium: 136 mmol/L (ref 135–145)

## 2017-11-10 LAB — CULTURE, BLOOD (ROUTINE X 2)
Culture: NO GROWTH
Culture: NO GROWTH
SPECIAL REQUESTS: ADEQUATE
Special Requests: ADEQUATE

## 2017-11-10 LAB — POCT I-STAT 7, (LYTES, BLD GAS, ICA,H+H)
Bicarbonate: 24 mmol/L (ref 20.0–28.0)
CALCIUM ION: 1.07 mmol/L — AB (ref 1.15–1.40)
HEMATOCRIT: 31 % — AB (ref 39.0–52.0)
HEMOGLOBIN: 10.5 g/dL — AB (ref 13.0–17.0)
O2 Saturation: 100 %
PCO2 ART: 35.2 mmHg (ref 32.0–48.0)
PH ART: 7.436 (ref 7.350–7.450)
POTASSIUM: 3.9 mmol/L (ref 3.5–5.1)
Patient temperature: 35.6
SODIUM: 134 mmol/L — AB (ref 135–145)
TCO2: 25 mmol/L (ref 22–32)
pO2, Arterial: 160 mmHg — ABNORMAL HIGH (ref 83.0–108.0)

## 2017-11-10 LAB — CBC
HCT: 28.5 % — ABNORMAL LOW (ref 39.0–52.0)
HEMOGLOBIN: 8.7 g/dL — AB (ref 13.0–17.0)
MCH: 27.8 pg (ref 26.0–34.0)
MCHC: 30.5 g/dL (ref 30.0–36.0)
MCV: 91.1 fL (ref 78.0–100.0)
Platelets: 129 10*3/uL — ABNORMAL LOW (ref 150–400)
RBC: 3.13 MIL/uL — ABNORMAL LOW (ref 4.22–5.81)
RDW: 18.5 % — ABNORMAL HIGH (ref 11.5–15.5)
WBC: 5 10*3/uL (ref 4.0–10.5)

## 2017-11-10 SURGERY — LIGATION OF ARTERIOVENOUS  FISTULA
Anesthesia: General | Site: Arm Upper | Laterality: Right

## 2017-11-10 MED ORDER — EPHEDRINE SULFATE 50 MG/ML IJ SOLN
INTRAMUSCULAR | Status: AC
Start: 2017-11-10 — End: ?
  Filled 2017-11-10: qty 3

## 2017-11-10 MED ORDER — SODIUM CHLORIDE 0.9 % IV SOLN
INTRAVENOUS | Status: DC | PRN
  Administered 2017-11-10: 11:00:00

## 2017-11-10 MED ORDER — ONDANSETRON HCL 4 MG/2ML IJ SOLN
INTRAMUSCULAR | Status: AC
Start: 2017-11-10 — End: ?
  Filled 2017-11-10: qty 2

## 2017-11-10 MED ORDER — CEFAZOLIN SODIUM-DEXTROSE 2-4 GM/100ML-% IV SOLN: 2.0000 g | Freq: Once | INTRAVENOUS | Status: DC

## 2017-11-10 MED ORDER — HEPARIN SODIUM (PORCINE) 1000 UNIT/ML IJ SOLN
INTRAMUSCULAR | Status: DC | PRN
  Administered 2017-11-10: 10000 [IU] via INTRAVENOUS

## 2017-11-10 MED ORDER — PHENYLEPHRINE HCL 10 MG/ML IJ SOLN
INTRAVENOUS | Status: DC | PRN
  Administered 2017-11-10: 20 ug/min via INTRAVENOUS

## 2017-11-10 MED ORDER — PROPOFOL 10 MG/ML IV BOLUS
INTRAVENOUS | Status: AC
  Filled 2017-11-10: qty 20

## 2017-11-10 MED ORDER — PHENYLEPHRINE 40 MCG/ML (10ML) SYRINGE FOR IV PUSH (FOR BLOOD PRESSURE SUPPORT)
PREFILLED_SYRINGE | INTRAVENOUS | Status: AC
  Filled 2017-11-10: qty 10

## 2017-11-10 MED ORDER — PROPOFOL 10 MG/ML IV BOLUS
INTRAVENOUS | Status: DC | PRN
  Administered 2017-11-10: 150 mg via INTRAVENOUS
  Administered 2017-11-10: 50 mg via INTRAVENOUS

## 2017-11-10 MED ORDER — VASOPRESSIN 20 UNIT/ML IV SOLN
INTRAVENOUS | Status: AC
  Filled 2017-11-10: qty 1

## 2017-11-10 MED ORDER — POLYVINYL ALCOHOL 1.4 % OP SOLN
1.0000 [drp] | OPHTHALMIC | Status: DC | PRN
  Administered 2017-11-10: 1 [drp] via OPHTHALMIC
  Filled 2017-11-10: qty 15

## 2017-11-10 MED ORDER — LIDOCAINE 2% (20 MG/ML) 5 ML SYRINGE
INTRAMUSCULAR | Status: DC | PRN
  Administered 2017-11-10: 100 mg via INTRAVENOUS

## 2017-11-10 MED ORDER — PROTAMINE SULFATE 10 MG/ML IV SOLN
INTRAVENOUS | Status: AC
  Filled 2017-11-10: qty 5

## 2017-11-10 MED ORDER — THROMBIN 20000 UNITS EX SOLR
CUTANEOUS | Status: AC
  Filled 2017-11-10: qty 20000

## 2017-11-10 MED ORDER — CALCIUM CHLORIDE 10 % IV SOLN
INTRAVENOUS | Status: AC
  Filled 2017-11-10: qty 10

## 2017-11-10 MED ORDER — FENTANYL CITRATE (PF) 250 MCG/5ML IJ SOLN
INTRAMUSCULAR | Status: DC | PRN
  Administered 2017-11-10 (×2): 25 ug via INTRAVENOUS

## 2017-11-10 MED ORDER — OXYCODONE-ACETAMINOPHEN 5-325 MG PO TABS: 1.0000 | ORAL_TABLET | ORAL | Status: DC | PRN

## 2017-11-10 MED ORDER — EPHEDRINE SULFATE 50 MG/ML IJ SOLN
INTRAMUSCULAR | Status: DC | PRN
  Administered 2017-11-10: 5 mg via INTRAVENOUS
  Administered 2017-11-10 (×5): 10 mg via INTRAVENOUS
  Administered 2017-11-10: 5 mg via INTRAVENOUS
  Administered 2017-11-10 (×3): 10 mg via INTRAVENOUS

## 2017-11-10 MED ORDER — VASOPRESSIN 20 UNIT/ML IV SOLN
INTRAVENOUS | Status: DC | PRN
  Administered 2017-11-10 (×2): 2 [IU] via INTRAVENOUS

## 2017-11-10 MED ORDER — ONDANSETRON HCL 4 MG/2ML IJ SOLN
INTRAMUSCULAR | Status: DC | PRN
  Administered 2017-11-10: 4 mg via INTRAVENOUS

## 2017-11-10 MED ORDER — DEXAMETHASONE SODIUM PHOSPHATE 10 MG/ML IJ SOLN
INTRAMUSCULAR | Status: AC
  Filled 2017-11-10: qty 1

## 2017-11-10 MED ORDER — SODIUM CHLORIDE 0.9 % IV SOLN
INTRAVENOUS | Status: AC
  Filled 2017-11-10: qty 1.2

## 2017-11-10 MED ORDER — MIDAZOLAM HCL 2 MG/2ML IJ SOLN
INTRAMUSCULAR | Status: AC
  Filled 2017-11-10: qty 2

## 2017-11-10 MED ORDER — MIDAZOLAM HCL 2 MG/2ML IJ SOLN
INTRAMUSCULAR | Status: DC | PRN
  Administered 2017-11-10: 1 mg via INTRAVENOUS

## 2017-11-10 MED ORDER — DEXTROSE 50 % IV SOLN
INTRAVENOUS | Status: AC
Start: 2017-11-10 — End: ?
  Filled 2017-11-10: qty 50

## 2017-11-10 MED ORDER — 0.9 % SODIUM CHLORIDE (POUR BTL) OPTIME
TOPICAL | Status: DC | PRN
Start: 2017-11-10 — End: 2017-11-10
  Administered 2017-11-10: 1000 mL

## 2017-11-10 MED ORDER — CALCIUM CHLORIDE 10 % IV SOLN
INTRAVENOUS | Status: DC | PRN
  Administered 2017-11-10: 300 mg via INTRAVENOUS
  Administered 2017-11-10: 500 mg via INTRAVENOUS
  Administered 2017-11-10: 200 mg via INTRAVENOUS

## 2017-11-10 MED ORDER — SODIUM CHLORIDE 0.9 % IJ SOLN
INTRAMUSCULAR | Status: AC
  Filled 2017-11-10: qty 20

## 2017-11-10 MED ORDER — FENTANYL CITRATE (PF) 250 MCG/5ML IJ SOLN
INTRAMUSCULAR | Status: AC
  Filled 2017-11-10: qty 5

## 2017-11-10 MED ORDER — DEXAMETHASONE SODIUM PHOSPHATE 10 MG/ML IJ SOLN
INTRAMUSCULAR | Status: DC | PRN
  Administered 2017-11-10: 10 mg via INTRAVENOUS

## 2017-11-10 MED ORDER — ALBUMIN HUMAN 5 % IV SOLN
INTRAVENOUS | Status: DC | PRN
  Administered 2017-11-10: 11:00:00 via INTRAVENOUS

## 2017-11-10 MED ORDER — LIDOCAINE 2% (20 MG/ML) 5 ML SYRINGE
INTRAMUSCULAR | Status: AC
  Filled 2017-11-10: qty 10

## 2017-11-10 MED ORDER — SUCCINYLCHOLINE CHLORIDE 200 MG/10ML IV SOSY
PREFILLED_SYRINGE | INTRAVENOUS | Status: AC
  Filled 2017-11-10: qty 10

## 2017-11-10 SURGICAL SUPPLY — 57 items
ADAPTER SEAL SUPERHERO SUPPT (Vascular Products) IMPLANT
ADAPTER SUPERHERO SUPP SEAL (Vascular Products) ×2 IMPLANT
ADPR SUPERHERO SUPPT SEAL (Vascular Products) ×1 IMPLANT
ARMBAND PINK RESTRICT EXTREMIT (MISCELLANEOUS) ×3 IMPLANT
CANISTER SUCT 3000ML PPV (MISCELLANEOUS) ×3 IMPLANT
CANNULA VESSEL 3MM 2 BLNT TIP (CANNULA) IMPLANT
CATH ANGIO 5F BER2 65CM (CATHETERS) ×1 IMPLANT
CLIP VESOCCLUDE MED 24/CT (CLIP) ×1 IMPLANT
CLIP VESOCCLUDE SM WIDE 24/CT (CLIP) ×1 IMPLANT
COMPONENT HERO ACCESSORY KIT (VASCULAR PRODUCTS) IMPLANT
COVER PROBE W GEL 5X96 (DRAPES) ×3 IMPLANT
DERMABOND ADVANCED (GAUZE/BANDAGES/DRESSINGS) ×3
DERMABOND ADVANCED .7 DNX12 (GAUZE/BANDAGES/DRESSINGS) ×2 IMPLANT
DRAPE C-ARM 42X72 X-RAY (DRAPES) ×3 IMPLANT
DRAPE CHEST BREAST 15X10 FENES (DRAPES) ×3 IMPLANT
DRAPE HALF SHEET 40X57 (DRAPES) ×2 IMPLANT
DRAPE IMP U-DRAPE 54X76 (DRAPES) ×1 IMPLANT
DRAPE INCISE IOBAN 66X45 STRL (DRAPES) ×1 IMPLANT
DRAPE ORTHO SPLIT 77X108 STRL (DRAPES) ×2
DRAPE SURG ORHT 6 SPLT 77X108 (DRAPES) IMPLANT
ELECT REM PT RETURN 9FT ADLT (ELECTROSURGICAL) ×3
ELECTRODE REM PT RTRN 9FT ADLT (ELECTROSURGICAL) ×2 IMPLANT
GAUZE SPONGE 4X4 12PLY STRL (GAUZE/BANDAGES/DRESSINGS) ×1 IMPLANT
GLOVE BIO SURGEON STRL SZ7.5 (GLOVE) ×3 IMPLANT
GLOVE BIOGEL PI IND STRL 6.5 (GLOVE) IMPLANT
GLOVE BIOGEL PI IND STRL 8 (GLOVE) ×2 IMPLANT
GLOVE BIOGEL PI INDICATOR 6.5 (GLOVE) ×1
GLOVE BIOGEL PI INDICATOR 8 (GLOVE) ×1
GOWN STRL REUS W/ TWL LRG LVL3 (GOWN DISPOSABLE) ×6 IMPLANT
GOWN STRL REUS W/TWL LRG LVL3 (GOWN DISPOSABLE) ×3
GRAFT ACUSEAL GORE 6X40CM (Vascular Products) ×1 IMPLANT
KIT BASIN OR (CUSTOM PROCEDURE TRAY) ×3 IMPLANT
KIT TURNOVER KIT B (KITS) ×3 IMPLANT
NDL HYPO 25GX1X1/2 BEV (NEEDLE) IMPLANT
NEEDLE HYPO 25GX1X1/2 BEV (NEEDLE) IMPLANT
NS IRRIG 1000ML POUR BTL (IV SOLUTION) ×3 IMPLANT
PACK CV ACCESS (CUSTOM PROCEDURE TRAY) ×3 IMPLANT
PAD ARMBOARD 7.5X6 YLW CONV (MISCELLANEOUS) ×6 IMPLANT
SPONGE LAP 18X18 X RAY DECT (DISPOSABLE) ×2 IMPLANT
SPONGE SURGIFOAM ABS GEL 100 (HEMOSTASIS) ×3 IMPLANT
STOCKINETTE 6  STRL (DRAPES) ×1
STOCKINETTE 6 STRL (DRAPES) IMPLANT
SUT ETHILON 3 0 PS 1 (SUTURE) IMPLANT
SUT PROLENE 6 0 BV (SUTURE) ×4 IMPLANT
SUT SILK 0 TIES 10X30 (SUTURE) ×3 IMPLANT
SUT SILK 2 0 SH (SUTURE) ×1 IMPLANT
SUT VIC AB 3-0 SH 27 (SUTURE) ×3
SUT VIC AB 3-0 SH 27X BRD (SUTURE) ×2 IMPLANT
SUT VIC AB 4-0 PS2 27 (SUTURE) ×3 IMPLANT
SUT VICRYL 4-0 PS2 18IN ABS (SUTURE) ×5 IMPLANT
SYR 30ML LL (SYRINGE) IMPLANT
SYR 5ML LL (SYRINGE) ×3 IMPLANT
TAPE CLOTH SURG 4X10 WHT LF (GAUZE/BANDAGES/DRESSINGS) ×1 IMPLANT
TOWEL GREEN STERILE (TOWEL DISPOSABLE) ×3 IMPLANT
UNDERPAD 30X30 (UNDERPADS AND DIAPERS) ×3 IMPLANT
WATER STERILE IRR 1000ML POUR (IV SOLUTION) ×3 IMPLANT
WIRE AMPLATZ SS-J .035X260CM (WIRE) ×1 IMPLANT

## 2017-11-10 NOTE — Anesthesia Postprocedure Evaluation (Signed)
Anesthesia Post Note  Patient: Adam [name].  Procedure(s) Performed: LIGATION OF BRACHIAL VEIN TRANSPOSITION RIGHT ARM (Right Arm Upper) INSERTION OF SUPER HERO VASCULAR ACCESS DEVICE LEFT ARM (Left Arm Upper)     Patient location during evaluation: PACU Anesthesia Type: General Level of consciousness: awake and alert Pain management: pain level controlled Vital Signs Assessment: post-procedure vital signs reviewed and stable Respiratory status: spontaneous breathing, nonlabored ventilation, respiratory function stable and patient connected to nasal cannula oxygen Cardiovascular status: blood pressure returned to baseline and stable Postop Assessment: no apparent nausea or vomiting Anesthetic complications: no    Last Vitals:  Vitals:   11/10/17 1421 11/10/17 1445  BP:  95/71  Pulse:  85  Resp:  16  Temp: (!) 36.4 C (!) 36.4 C  SpO2:  95%    Last Pain:  Vitals:   11/10/17 1445  TempSrc: Oral  PainSc:                  [place]

## 2017-11-10 NOTE — Anesthesia Procedure Notes (Addendum)
Procedure Name: Intubation Date/Time: 11/10/2017 12:38 PM Performed by: Imagene Riches, CRNA Pre-anesthesia Checklist: Patient identified, Emergency Drugs available, Suction available and Patient being monitored Patient Re-evaluated:Patient Re-evaluated prior to induction Oxygen Delivery Method: Circle System Utilized Preoxygenation: Pre-oxygenation with 100% oxygen Induction Type: IV induction Ventilation: Mask ventilation without difficulty Laryngoscope Size: Miller and 3 Grade View: Grade I Tube type: Oral Tube size: 7.5 mm Number of attempts: 1 Airway Equipment and Method: Stylet and Oral airway Placement Confirmation: ETT inserted through vocal cords under direct vision,  positive ETCO2 and breath sounds checked- equal and bilateral Secured at: 24 cm Tube secured with: Tape Dental Injury: Teeth and Oropharynx as per pre-operative assessment

## 2017-11-10 NOTE — Progress Notes (Signed)
Kami, RN from [facility] came and retrieved patient's wedding band and glasses.

## 2017-11-10 NOTE — Op Note (Signed)
NAME: Adam [name].    MRN: [id number] DOB: 1961/01/28    DATE OF OPERATION: 11/10/2017  PREOP DIAGNOSIS:    End-stage renal disease  POSTOP DIAGNOSIS:    Same  PROCEDURE:    1.  Removal of Left IJ tunneled dialysis catheter  2.  Placement of HeRO graft with AcuSeal graft (6 mm) 3.  Ligation of first stage right brachial brachial fistula  SURGEON: Judeth Cornfield. Scot Dock, MD, FACS  ASSIST: Arlee Muslim, PA  ANESTHESIA: General  EBL: Minimal  INDICATIONS:    [name]. is a 57 y.o. male who developed venous hypertension in his right arm.  He underwent a central venogram which showed central venous occlusion on the right.  I was set up to ligate his fistula and change his left IJ tunneled dialysis catheter to a HeRO graft  FINDINGS:   Good thrill in the graft at the completion of the procedure.  Radial and ulnar signal with the Doppler.  TECHNIQUE:   The patient was taken to the operating room and received a general anesthetic.  The neck and upper chest and the entire left upper extremity were prepped and draped in usual sterile fashion.  Incision was made over the entrance sit for the tunneled dialysis catheter into the left IJ.  The catheter was dissected free clamped and divided.  A 280 cm Amplatz wire was then advanced through the catheter and then using a Berenstein 2 catheter the wire was directed down into the inferior vena cava.  The wire was advanced.  The old catheter was removed and pressure held for hemostasis.  The tract was dilated with a 69 [place] and then a 20 [place] dilator.  The dilator and peel-away sheath were then advanced over the wire.  The wire and dilator were then removed and the hemostasis obtained using a plug.  Next the central portion of the HeRO was advanced through the peel-away sheath and then the peel-away sheath was removed.  The graft was clamped.  A separate incision was made over the deltopectoral groove and a pocket created  for the connection between the graft and the central portion of the HeRO .    Next a longitudinal incision was made over the brachial artery above the antecubital level.  The artery here it was calcified but patent and had a good pulse.  A tunnel was created from this incision to the incision for the connection.  The patient was then heparinized.  On the back table the connection to the PTFE graft was made this graft was then brought through the tunnel and the connection segment connected to the central portion of the HeRO graft.  The graft was then pulled the appropriate length.  Attention was then turned to the proximal anastomosis.  The brachial artery was clamped proximally distally and a longitudinal arteriotomy was made.  Given that this was a 6 mm graft I elected to sew this directly into side to the artery without spatulated and the graft in order to keep the arteriotomy small.  The graft was cut to the appropriate length and sewn into side to the artery using continuous 6-0 Prolene suture.  At the completion there was a good thrill in the graft.  There was a radial and ulnar signal with a Doppler.  Hemostasis was obtained in the wounds.  The wounds were closed the deep layer of 3-0 Vicryl and the skin closed with 4-0 Vicryl.  Attention was then turned to  the right arm.  Using the ultrasound scanner identify the area of the fistula.  This was a first stage brachial brachial transposition.  Right arm was prepped and draped in usual sterile fashion.  A longitudinal incision was made over the fistula.  The vein was dissected free and ligated with a 2-0 silk tie.  There was significant venous hypertension with multiple varicose veins.  The wound was closed with a deep layer of 3-0 Vicryl to skin closed with 4-0 Vicryl.  Sterile dressing was applied.  The patient tolerated the procedure well and was transferred to the recovery room in stable condition.  All needle and sponge counts were  correct.  [name], MD, FACS [facility]  DATE OF DICTATION:   11/10/2017

## 2017-11-10 NOTE — Progress Notes (Signed)
Spoke to Dr. Scot Dock via [place] nurse regarding IV access. States he wants to see patient.

## 2017-11-10 NOTE — Progress Notes (Signed)
Patient in room following HeRO graft procedure.  Patient states that he is in mild pain from the procedure and that he is very tired and hungry.  He is complaining of irritation in the temporal corner of both eyes since awakening from the procedure.  He denies pain with movement of his eyes.  He still endorses pain over the incision site on his right knee, but reports that it is improving and that ROM is improving.  Wife also notes that she would like a shower chair upon discharge of patient to help while his knee is healing.  Vitals: Vitals:   11/10/17 1421 11/10/17 1445  BP:  95/71  Pulse:  85  Resp:  16  Temp: (!) 97.5 F (36.4 C) (!) 97.5 F (36.4 C)  SpO2:  95%   Physical Exam: General: 57yo male lying in bed, in NAD Cardio: RRR, no m/r/g Lungs: Normal effort, no respiratory distress Abdomen: Soft, non-tender to palpation, + Bowel Sounds Extremities: RUE bandage, RLE wrapped with Ace Bandage  Plan: - Order shower chair - Home Health PT per PT recommendation - Artificial Tears OU PRN

## 2017-11-10 NOTE — Anesthesia Procedure Notes (Signed)
Procedure Name: Intubation Date/Time: 11/10/2017 10:27 AM Performed by: Imagene Riches, CRNA Pre-anesthesia Checklist: Patient identified, Emergency Drugs available, Suction available and Patient being monitored Patient Re-evaluated:Patient Re-evaluated prior to induction Oxygen Delivery Method: Circle System Utilized Preoxygenation: Pre-oxygenation with 100% oxygen Induction Type: IV induction Ventilation: Mask ventilation without difficulty LMA: LMA inserted LMA Size: 4.0 Number of attempts: 1 Placement Confirmation: positive ETCO2 and breath sounds checked- equal and bilateral Tube secured with: Tape Dental Injury: Teeth and Oropharynx as per pre-operative assessment

## 2017-11-10 NOTE — Interval H&P Note (Signed)
History and Physical Interval Note:  11/10/2017 12:31 PM  Adam [name].  has presented today for surgery, with the diagnosis of COMPLICATION WITH FISTULA RIGHT ARM/ HEMODIALYSIS ACCESS  The various methods of treatment have been discussed with the patient and family. After consideration of risks, benefits and other options for treatment, the patient has consented to  Procedure(s): LIGATION OF BRACHIAL VEIN TRANSPOSITION RIGHT ARM (Right) INSERTION OF SUPER HERO VASCULAR ACCESS DEVICE LEFT ARM (Left) as a surgical intervention .  The patient's history has been reviewed, patient examined, no change in status, stable for surgery.  I have reviewed the patient's chart and labs.  Questions were answered to the patient's satisfaction.     [name]

## 2017-11-10 NOTE — Progress Notes (Signed)
Family Medicine Teaching Service Daily Progress Note Intern Pager: [phone]  Patient name: Adam [name]. Medical record number: [id number] Date of birth: 04-24-1961 Age: 57 y.o. Gender: male  Primary Care Provider: [name], MD Consultants: orthopedic surgery, nephrology Code Status: full  Pt Overview and Major Events to Date:  Admitted to [facility], transferred to [facility] for dialysis and treatment 6/24 Received dialysis on 6/26 Approved for discharge from ortho perspective 6/27, then decision to wash out knee made from patient's home orthopedist  Knee washout with fluid cultures recollected 6/28 Drain removed from joint 6/30 HeRO Graft Placement 7/2  Assessment and Plan: Adam [name]. is a 57 y.o. male presenting with right leg pain, redness, and swelling.  His medical history includes ESRD on dialysis MWF, last dialysis on 6/24, atrial fibrillation on coumadin, moderate aortic stenosis, PAH, nonischemic cardiomyopathy with removal of ICD in April this year following MSSA bacteremia due to AV fistula infection.  R knee pain and swelling, post washout:  Suspect culture-negative septic arthritis although inflammatory arthritis also a likely possibility.  S/p washout by Ortho 6/28; drain removed 6/30.  Per ID recommendations, coverage with ceftriaxone and vancomycin- duration of therapy to be decided based on results of culture.  Pt notes improving knee pain that is concentrated over incision site.  He has remained afebrile overnight without white count.  Wound cultures with no growth so far.   Of note, wound cx from [facility] ED on 6/24 negative which was prior to antibiotics.  Will discuss with ID for further clarification.  - follow up repeat blood cultures - no growth 4 days - follow up intraoperative fluid culture - no growth 3 days - ID recs for length of antibiotic therapy, ID states patient is able to be discharged from their standpoint and will continue  antibiotics with HD - continue tramadol 50 mg Q6H PRN, use judiciously due to patient's hypotension  Painful subcutaneous masses: Three painful, swollen areas on L hip, axilla, and thoracolumbar area, with a new nodule on the R flank since hospitalization.  Denies new nodule since Friday.  Have grown from quarter size to grapefruit size since April and are tender to palpation.  [place] only positive for edema, no abscesses visualized.  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 - surgery consult outpatient, will need deep tissue biopsy of the subcutaneous tissue - stop warfarin, start Eliquis after patient's outpatient vascular surgery procedure   QTc prolongation: EKG on 6/29 reports a QTc interval of 545, but manually calculated, QTc is closer to normal range at 465 msec. - monitor use of zofran; no need to make medication adjustments currently  ESRD: MWF.  Takes midodrine 10 mg TID AC for hypotension and sensipar 90 mg daily at 1700.  Patient's BP 58/31 this AM upon examination post midodrine administration, corrected to 89/35 after examination completed.  Patient was asymptomatic during this time and reported 10 year history of hypotension.  Dr. Bridgett Larsson performed a RUE venography and determined that his right AV fistula will not be usable.  Scheduled for dialysis access 7/2.  - Vascular procedure this AM - follow up vascular surgery recs - continue home midodrine and sensipar  Recent MSSA bacteremia: occurred in April 2019, cultured from AV fistula wound and blood.  AV fistula was ligated, and he is currently getting dialysis from a perm cath, although RUE fistula is maturing.  ICD was removed at that time given bacteremia.  A TEE was performed but results are not in system. - f/u repeat blood cultures - NG x 4 days  Anemia of chronic disease: Hemoglobin 8.7 on 7/2, appears to be around his  baseline of ~9.  MCV in normal range.  Platelets are low at but also seem to be at baseline. - continue to monitor   Atrial fibrillation: Regular rhythm during admission, takes amiodarone 200 mg daily and coumadin 6 mg on Sunday, 9 mg other days.  Couma.  Will re-evaluate following vascular procedure. - continue amiodarone - Eliquis after vascular procedure  PAH: Echo on 08/18/17 showed an PA peak pressure of 59 mmHg. Takes ambrisentan 10 mg daily and tadalafil 20 mg daily. - continue home meds   HFpEF with aortic stenosis: Echo with EF of 60-65%, dilated IVC with elevated central venous pressure, moderate aortic stenosis with mean gradient of 25 mmHg. - administer fluids cautiously if needed   HLD: takes atorvastatin 40 mg daily - continue home atorvastatin  GERD: takes protonix 40 mg BID and sucralfate 1 g BID at home - per pharmacy recommendation, will discuss GERD symptoms with patient and consider decreasing protonic to 40mg  QD - continue home meds   Hypothyroidism: Iatrogenic d/t thyroidectomy for treatment of thyroid cancer.  Takes synthroid 300 mcg daily.  Last TSH was 3.73 in Sept 2018.  TSH 0.696 on 6/25 - continue synthroid   FEN/GI: renal diet PPx: SCDs  Disposition: continued inpatient stay, requiring IV antibiotics  Subjective:  **Unable to speak with patient as he is in procedure for HeRO graft.**  Objective: Temp:  [97.9 F (36.6 C)-99 F (37.2 C)] 98.6 F (37 C) (07/01 2125) Pulse Rate:  [59-96] 81 (07/02 0537) Resp:  [15-18] 18 (07/02 0537) BP: (80-117)/(30-67) 81/51 (07/02 0537) SpO2:  [94 %-99 %] 94 % (07/02 0537) Weight:  [121.9 kg (268 lb 11.9 oz)-122.2 kg (269 lb 6.4 oz)] 121.9 kg (268 lb 11.9 oz) (07/01 1317)   Physical Exam: **Patient in surgery and unable to be examined at this time as he is in procedure**  Laboratory: Recent Labs  Lab 11/07/17 0803 11/09/17 0432 11/10/17 0529  WBC 5.1 4.7 5.0  HGB 9.0* 8.7* 8.7*  HCT 30.6* 28.4* 28.5*   PLT 127* 124* 129*   Recent Labs  Lab 11/07/17 0803 11/09/17 0432 11/10/17 0529  NA 137 134* 136  K 3.3* 3.4* 3.5  CL 96* 96* 97*  CO2 28 24 25   BUN 13 23* 16  CREATININE 5.03* 7.49* 5.94*  CALCIUM 8.1* 7.7* 7.8*  GLUCOSE 83 74 82    Imaging/Diagnostic Tests: Dg Chest 1 View  Result Date: 11/07/2017 CLINICAL DATA:  Pre-op chest exam for fistula EXAM: CHEST  1 VIEW COMPARISON:  09/24/2017 FINDINGS: Lungs are clear. Improvement in pulmonary vascular congestion. Stable tunneled left IJ hemodialysis catheter to the proximal right atrium. Left axillary vascular stents as before. Stable cardiomegaly.  Aortic Atherosclerosis (ICD10-170.0). No effusion. Visualized bones unremarkable. IMPRESSION: Stable cardiomegaly.  No acute findings. Electronically Signed   By: Lucrezia Europe M.D.   On: 11/07/2017 08:22   [place] Chest Soft Tissue  Result Date: 11/03/2017 CLINICAL DATA:  Diffuse swelling along the LEFT back, flank, chest and axilla EXAM: ULTRASOUND OF HEAD/NECK SOFT TISSUES TECHNIQUE: Ultrasound examination of the LEFT flank, back, chest and axillary soft tissues was performed in the area of clinical concern. COMPARISON:  None FINDINGS: Diffuse subcutaneous edema along the LEFT flank, back, chest and axilla  identified. No discrete abscess. No other abnormalities noted. IMPRESSION: Diffuse edema along the LEFT back, chest and axilla. No discrete abscess. Electronically Signed   By: Margarette Canada M.D.   On: 11/03/2017 18:50     Rittberger, Bernita Raisin, DO 11/10/2017, 6:58 AM PGY-1, [place]

## 2017-11-10 NOTE — Transfer of Care (Signed)
Immediate Anesthesia Transfer of Care Note  Patient: Adam [name].  Procedure(s) Performed: LIGATION OF BRACHIAL VEIN TRANSPOSITION RIGHT ARM (Right Arm Upper) INSERTION OF SUPER HERO VASCULAR ACCESS DEVICE LEFT ARM (Left Arm Upper)  Patient Location: PACU  Anesthesia Type:General  Level of Consciousness: awake, alert  and oriented  Airway & Oxygen Therapy: Patient Spontanous Breathing and Patient connected to nasal cannula oxygen  Post-op Assessment: Report given to RN and vital signs remain consistent with intraoperative VS.   Post vital signs: Reviewed  Last Vitals:  Vitals Value Taken Time  BP    Temp    Pulse    Resp    SpO2      Last Pain:  Vitals:   11/10/17 0754  TempSrc: Oral  PainSc:       Patients Stated Pain Goal: 0 (56/31/49 7026)  Complications: No apparent anesthesia complications

## 2017-11-11 ENCOUNTER — Encounter (HOSPITAL_COMMUNITY): Payer: Self-pay | Admitting: Vascular Surgery

## 2017-11-11 ENCOUNTER — Other Ambulatory Visit: Payer: Self-pay | Admitting: Family Medicine

## 2017-11-11 LAB — AEROBIC/ANAEROBIC CULTURE W GRAM STAIN (SURGICAL/DEEP WOUND): Culture: NO GROWTH

## 2017-11-11 LAB — RENAL FUNCTION PANEL
ALBUMIN: 2.9 g/dL — AB (ref 3.5–5.0)
ANION GAP: 14 (ref 5–15)
BUN: 27 mg/dL — AB (ref 6–20)
CHLORIDE: 99 mmol/L (ref 98–111)
CO2: 22 mmol/L (ref 22–32)
Calcium: 8.3 mg/dL — ABNORMAL LOW (ref 8.9–10.3)
Creatinine, Ser: 7.3 mg/dL — ABNORMAL HIGH (ref 0.61–1.24)
GFR calc Af Amer: 9 mL/min — ABNORMAL LOW (ref >60)
GFR, EST NON AFRICAN AMERICAN: 7 mL/min — AB (ref >60)
Glucose, Bld: 125 mg/dL — ABNORMAL HIGH (ref 70–99)
PHOSPHORUS: 2.4 mg/dL — AB (ref 2.5–4.6)
POTASSIUM: 3.4 mmol/L — AB (ref 3.5–5.1)
Sodium: 135 mmol/L (ref 135–145)

## 2017-11-11 LAB — AEROBIC/ANAEROBIC CULTURE (SURGICAL/DEEP WOUND): CULTURE: NO GROWTH

## 2017-11-11 LAB — CBC
HEMATOCRIT: 26.9 % — AB (ref 39.0–52.0)
HEMOGLOBIN: 8.3 g/dL — AB (ref 13.0–17.0)
MCH: 27.8 pg (ref 26.0–34.0)
MCHC: 30.9 g/dL (ref 30.0–36.0)
MCV: 90 fL (ref 78.0–100.0)
Platelets: 139 10*3/uL — ABNORMAL LOW (ref 150–400)
RBC: 2.99 MIL/uL — ABNORMAL LOW (ref 4.22–5.81)
RDW: 18.1 % — AB (ref 11.5–15.5)
WBC: 6.2 10*3/uL (ref 4.0–10.5)

## 2017-11-11 MED ORDER — PANTOPRAZOLE SODIUM 40 MG PO TBEC
40.0000 mg | DELAYED_RELEASE_TABLET | Freq: Every day | ORAL | Status: DC
  Administered 2017-11-11 – 2017-11-12 (×2): 40 mg via ORAL
  Filled 2017-11-11 (×2): qty 1

## 2017-11-11 MED ORDER — ALTEPLASE 2 MG IJ SOLR: 2.0000 mg | Freq: Once | INTRAMUSCULAR | Status: DC | PRN

## 2017-11-11 MED ORDER — VANCOMYCIN HCL IN DEXTROSE 1-5 GM/200ML-% IV SOLN
INTRAVENOUS | Status: AC
  Administered 2017-11-11: 1000 mg
  Filled 2017-11-11: qty 200

## 2017-11-11 MED ORDER — TRAMADOL HCL 50 MG PO TABS: 50.0000 mg | ORAL_TABLET | Freq: Four times a day (QID) | ORAL | 0 refills | Status: DC | PRN

## 2017-11-11 MED ORDER — SODIUM CHLORIDE 0.9 % IV SOLN: 100.0000 mL | INTRAVENOUS | Status: DC | PRN

## 2017-11-11 MED ORDER — APIXABAN 2.5 MG PO TABS
2.5000 mg | ORAL_TABLET | Freq: Two times a day (BID) | ORAL | Status: DC
  Administered 2017-11-11 – 2017-11-12 (×3): 2.5 mg via ORAL
  Filled 2017-11-11 (×3): qty 1

## 2017-11-11 MED ORDER — VANCOMYCIN HCL IN DEXTROSE 1-5 GM/200ML-% IV SOLN: 1000.0000 mg | INTRAVENOUS | 0 refills | Status: DC

## 2017-11-11 MED ORDER — PENTAFLUOROPROP-TETRAFLUOROETH EX AERO: 1.0000 "application " | INHALATION_SPRAY | CUTANEOUS | Status: DC | PRN

## 2017-11-11 MED ORDER — LIDOCAINE-PRILOCAINE 2.5-2.5 % EX CREA: 1.0000 "application " | TOPICAL_CREAM | CUTANEOUS | Status: DC | PRN

## 2017-11-11 MED ORDER — CEFAZOLIN SODIUM-DEXTROSE 2-4 GM/100ML-% IV SOLN: 2.0000 g | INTRAVENOUS | Status: DC

## 2017-11-11 MED ORDER — LIDOCAINE HCL (PF) 1 % IJ SOLN: 5.0000 mL | INTRAMUSCULAR | Status: DC | PRN

## 2017-11-11 MED ORDER — PANTOPRAZOLE SODIUM 40 MG PO TBEC: 40.0000 mg | DELAYED_RELEASE_TABLET | Freq: Every day | ORAL | 0 refills | Status: DC

## 2017-11-11 MED ORDER — APIXABAN 2.5 MG PO TABS: 2.5000 mg | ORAL_TABLET | Freq: Two times a day (BID) | ORAL | 0 refills | Status: DC

## 2017-11-11 MED ORDER — HEPARIN SODIUM (PORCINE) 1000 UNIT/ML DIALYSIS: 1000.0000 [IU] | INTRAMUSCULAR | Status: DC | PRN

## 2017-11-11 NOTE — Progress Notes (Signed)
Transmission to pharmacy failed. Please resend. Danley Danker, RN [facility] RN)

## 2017-11-11 NOTE — Progress Notes (Signed)
Patients BP 76/37. Patient asymptomatic. RN notified on call Family Medicine MD.MD advised RN to continue to monitor patient and that MD will come see patient.  Patient had a R thumb peripheral IV that was no longer patent. IV team consulted. Unable to place peripheral IV in. Patient refused to be stuck again. RN notified MD. MD is okay with patient not having a peripheral IV for the night.  Ermalinda Memos, RN

## 2017-11-11 NOTE — Care Management Note (Signed)
Case Management Note  Patient Details  Name: Adam [name]. MRN: [id number] Date of Birth: Apr 10, 1961  Subjective/Objective:             Patient admitted w septic knee, will DC today to home w wife. Would like to use [facility] for [facility] PT. Referral made to [facility]. Will also have RW and 3/1 delivered to room prior to DC.       Action/Plan:   Expected Discharge Date:  11/05/17               Expected Discharge Plan:  [place]  In-House Referral:     Discharge planning Services  CM Consult  Post Acute Care Choice:  Home Health, Durable Medical Equipment Choice offered to:  Patient  DME Arranged:  3-N-1, Walker rolling DME Agency:  [place]:  PT [place]:  [place]  Status of Service:  Completed, signed off  If discussed at [place] of Stay Meetings, dates discussed:    Additional Comments:  Carles Collet, RN 11/11/2017, 10:30 AM

## 2017-11-11 NOTE — Progress Notes (Signed)
   VASCULAR SURGERY ASSESSMENT & PLAN:   1 Day Post-Op s/p: Left UE HeRO AVG and ligation of Right AVF  His left arm graft is working and they can use this today for dialysis using the fistula protocol with 17-gauge needles.  Vascular surgery will be available as needed.  SUBJECTIVE:   No complaints this morning.  PHYSICAL EXAM:   Vitals:   11/10/17 1853 11/10/17 2054 11/10/17 2130 11/11/17 0534  BP: (!) 98/45 (!) 64/44 (!) 76/37 (!) 86/32  Pulse: 88 88 85 80  Resp: 18 18 16 16   Temp: (!) 97.5 F (36.4 C) 97.9 F (36.6 C)  98.5 F (36.9 C)  TempSrc: Oral Oral    SpO2:  100% 96% 95%  Weight:      Height:       Good bruit in left upper arm graft. Left hand is warm and well-perfused. Right arm swelling slightly improved.  LABS:   Lab Results  Component Value Date   WBC 5.0 11/10/2017   HGB 10.5 (L) 11/10/2017   HCT 31.0 (L) 11/10/2017   MCV 91.1 11/10/2017   PLT 129 (L) 11/10/2017   Lab Results  Component Value Date   CREATININE 5.94 (H) 11/10/2017   Lab Results  Component Value Date   INR 1.73 11/10/2017    PROBLEM LIST:    Principal Problem:   Septic arthritis of knee, right (HCC) Active Problems:   Atrial fibrillation [I48.91]   Pulmonary hypertension (HCC)   ESRD (end stage renal disease) on hemodialysis   Infected defibrillator [facility])   Dialysis AV fistula infection, sequela   Malnutrition of moderate degree   Abscess   Acute pain of right knee   Calciphylaxis   CURRENT MEDS:   . ambrisentan  10 mg Oral Daily  . amiodarone  200 mg Oral QPM  . apixaban  2.5 mg Oral BID  . atorvastatin  40 mg Oral QPM  . calcium acetate  1,334 mg Oral TID WC  . Chlorhexidine Gluconate Cloth  6 each Topical Q0600  . Chlorhexidine Gluconate Cloth  6 each Topical Q0600  . cinacalcet  90 mg Oral QAC supper  . docusate sodium  100 mg Oral BID  . levothyroxine  300 mcg Oral QAC breakfast  . midodrine  10 mg Oral TID WC  . multivitamin  1 tablet Oral QHS  .  pantoprazole  40 mg Oral BID AC  . sucralfate  1 g Oral BID  . tadalafil  20 mg Oral Daily    [name] Beeper: [phone] Office: [phone] 11/11/2017

## 2017-11-11 NOTE — Progress Notes (Signed)
    Subjective:  Patient reports pain as mild.  Denies N/V/CP/SOB. States knee feels much better  Objective:   VITALS:   Vitals:   11/10/17 1853 11/10/17 2054 11/10/17 2130 11/11/17 0534  BP: (!) 98/45 (!) 64/44 (!) 76/37 (!) 86/32  Pulse: 88 88 85 80  Resp: 18 18 16 16   Temp: (!) 97.5 F (36.4 C) 97.9 F (36.6 C)  98.5 F (36.9 C)  TempSrc: Oral Oral    SpO2:  100% 96% 95%  Weight:      Height:        NAD ABD soft Sensation intact distally Intact pulses distally Dorsiflexion/Plantar flexion intact Incision: scant drainage   Lab Results  Component Value Date   WBC 5.0 11/10/2017   HGB 10.5 (L) 11/10/2017   HCT 31.0 (L) 11/10/2017   MCV 91.1 11/10/2017   PLT 129 (L) 11/10/2017   BMET    Component Value Date/Time   NA 134 (L) 11/10/2017 1253   K 3.9 11/10/2017 1253   CL 97 (L) 11/10/2017 0529   CO2 25 11/10/2017 0529   GLUCOSE 82 11/10/2017 0529   BUN 16 11/10/2017 0529   CREATININE 5.94 (H) 11/10/2017 0529   CREATININE 4.91 (H) 08/13/2015 1223   CALCIUM 7.8 (L) 11/10/2017 0529   GFRNONAA 10 (L) 11/10/2017 0529   GFRNONAA 13 (L) 07/27/2014 1539   GFRAA 11 (L) 11/10/2017 0529   GFRAA 15 (L) 07/27/2014 1539     Assessment/Plan: 1 Day Post-Op   Principal Problem:   Septic arthritis of knee, right (HCC) Active Problems:   Atrial fibrillation [I48.91]   Pulmonary hypertension (HCC)   ESRD (end stage renal disease) on hemodialysis   Infected defibrillator [facility])   Dialysis AV fistula infection, sequela   Malnutrition of moderate degree   Abscess   Acute pain of right knee   Calciphylaxis   WBAT with walker DVT ppx: holding chemical DVT ppx per vascular, SCDs, TEDS PO pain control PT/OT Dispo: D/C with IV abx per ID when ok with primary team   Hilton Cork Shahil Speegle 11/11/2017, 8:03 AM   Rod Can, MD Cell [phone]

## 2017-11-11 NOTE — Progress Notes (Signed)
Call received from nurse to relay that he did not received ancef with dialysis due to no IV access, but did receive vanc.  Attempted to call HD to ask why ancef could not be given with dialysis, but no answer.  Will follow up on in AM.

## 2017-11-11 NOTE — Progress Notes (Signed)
Pts wife called and expressed concerns of pt coming home.  States if he is left alone may try to climb stairs.  Stated pt has been forgetful.  Paged Family Medicine FYI.

## 2017-11-11 NOTE — Progress Notes (Signed)
Pt's wife called and expressed concern for patient being discharged today.  Called patient's wife.  She stated that she feels unsafe bringing her husband home due to his difficulty ambulating following his knee procedure.  She also expresses concern about his mental status and states that he has been more forgetful since being hospitalized from MSSA bacteremia and she is concerned that he would not be able to stay at home alone due to this and his physical limitations.  Pt seen and he also notes that he has been feeling as though he is more forgetful while he is in the hospital, but states that it is because he feels very overwhelmed with his medical problems.  He expresses that he would like to go to a rehab facility instead of home with home PT because he is also concerned about his ability to get around at home and feels it would be unsafe.  MMSE 30/30  Pt advised that MMSE revealed no cognitive impairment.  PT note recommended home health PT.  Pt advised that order would be placed for PT evaluation for SNF placement.  Pt voiced agreement with this plan.  Wife updated on the plan.

## 2017-11-11 NOTE — Progress Notes (Signed)
Physical Therapy Treatment Patient Details Name: Adam [name]. MRN: [id number] DOB: 11/06/1960 Today's Date: 11/11/2017    History of Present Illness Pt is a 57 y.o. male admitted 11/02/17 with c/o R knee pain; suspected R knee septic arthritis. Now s/p R knee arthrotomy and drainage 6/28. 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 PMH includes ESRD (HD MWF), a-fib, HTN, cardiac debibrillator, subcutaneous nodules.    PT Comments    Patient is progressing very well towards their physical therapy goals. Requesting to trial cane this session due to having carpet in home. Ambulating 100 feet with cane and min guard assist; no overt loss of balance but decreased gait speed as compared to the walker. Recommended walker use for home initially; patient agreed. Rest of session focused on continued strengthening/range of motion exercises for RLE.    Follow Up Recommendations  Home health PT;Supervision - Intermittent     Equipment Recommendations  Rolling walker with 5" wheels;3in1 (PT)    Recommendations for Other Services       Precautions / Restrictions Precautions Precautions: Fall Restrictions Weight Bearing Restrictions: Yes RLE Weight Bearing: Weight bearing as tolerated    Mobility  Bed Mobility Overal bed mobility: Modified Independent                Transfers Overall transfer level: Needs assistance Equipment used: Rolling walker (2 wheeled) Transfers: Sit to/from Stand Sit to Stand: Supervision            Ambulation/Gait Ambulation/Gait assistance: Min guard Gait Distance (Feet): 200 Feet Assistive device: Rolling walker (2 wheeled);Straight cane Gait Pattern/deviations: Step-through pattern;Decreased stride length;Decreased weight shift to right Gait velocity: Decreased   General Gait Details: Initial 100 feet ambulated with walker and last 100 feet switched to cane. No overt LOB noted with cane but did demonstrate slower gait  speed. Recommended walker for home use initially. Cues for glute activation    Stairs             Wheelchair Mobility    Modified Rankin (Stroke Patients Only)       Balance Overall balance assessment: Needs assistance Sitting-balance support: No upper extremity supported;Feet supported Sitting balance-Leahy Scale: Normal     Standing balance support: Single extremity supported;During functional activity Standing balance-Leahy Scale: Fair                              Cognition Arousal/Alertness: Awake/alert Behavior During Therapy: WFL for tasks assessed/performed Overall Cognitive Status: Within Functional Limits for tasks assessed                                        Exercises General Exercises - Lower Extremity Long Arc Quad: 10 reps;Right;Seated Heel Slides: Right;10 reps;Seated Hip Flexion/Marching: 20 reps;Right;Seated    General Comments        Pertinent Vitals/Pain Pain Assessment: Faces Faces Pain Scale: Hurts little more Pain Location: anterior right knee Pain Descriptors / Indicators: Sore;Discomfort Pain Intervention(s): Monitored during session    Home Living                      Prior Function            PT Goals (current goals can now be found in the care plan section) Acute Rehab PT Goals Patient Stated Goal: Return home PT  Goal Formulation: With patient Time For Goal Achievement: 11/21/17 Potential to Achieve Goals: Good Progress towards PT goals: Progressing toward goals    Frequency    Min 5X/week      PT Plan Current plan remains appropriate    Co-evaluation              AM-PAC PT "6 Clicks" Daily Activity  Outcome Measure  Difficulty turning over in bed (including adjusting bedclothes, sheets and blankets)?: A Little Difficulty moving from lying on back to sitting on the side of the bed? : A Little Difficulty sitting down on and standing up from a chair with arms (e.g.,  wheelchair, bedside commode, etc,.)?: A Little Help needed moving to and from a bed to chair (including a wheelchair)?: A Little Help needed walking in hospital room?: A Little Help needed climbing 3-5 steps with a railing? : A Little 6 Click Score: 18    End of Session Equipment Utilized During Treatment: Gait belt Activity Tolerance: Patient tolerated treatment well Patient left: in chair;with call bell/phone within reach Nurse Communication: Mobility status PT Visit Diagnosis: Unsteadiness on feet (R26.81);Other abnormalities of gait and mobility (R26.89);Pain Pain - Right/Left: Right Pain - part of body: Knee     Time: 0928-0958 PT Time Calculation (min) (ACUTE ONLY): 30 min  Charges:  $Gait Training: 8-22 mins $Therapeutic Exercise: 8-22 mins                    G Codes:       [name], PT, DPT Acute Rehabilitation Services  Pager: [phone]    [name] 11/11/2017, 1:04 PM

## 2017-11-11 NOTE — Progress Notes (Signed)
[facility] Progress Note   Subjective:  LUE HeRO AVG placed yesterday Feels good today. No complaints    Objective Vitals:   11/10/17 1853 11/10/17 2054 11/10/17 2130 11/11/17 0534  BP: (!) 98/45 (!) 64/44 (!) 76/37 (!) 86/32  Pulse: 88 88 85 80  Resp: 18 18 16 16   Temp: (!) 97.5 F (36.4 C) 97.9 F (36.6 C)  98.5 F (36.9 C)  TempSrc: Oral Oral    SpO2:  100% 96% 95%  Weight:      Height:       Physical Exam General: WNWD male sitting in recliner NAD Heart: Irreguar rhythm 2/6 SEM  Lungs: CTAB Abdomen: soft NT/ND Extremities: R knee bandaged; Trace LE edema Dialysis Access: LUE HeRO AVG +bruit; RUE AVF ligated  some arm swelling   Dialysis Orders:  MWF at AF 4hr, 450/800, EDW 124.5kg, 3K/2.25Ca, AVF + TDC, no heparin (on warfarin) - Mircera 265mcg IV q 2 weeks (last 6/17) - Venofer 100mg  x 10 ordered   Assessment/Plan: 1. Right kneepain /effusion: Gram stain with >45000 WBC, negative blood and synovial fluid cultures with empiric intravenous vancomycin.  Status post surgical washout of right knee joint space and currently ongoing physical therapy. Continue Vanc/cefazolin for 4 weeks (until 12/07/17) 2. ESRD -MWF. For HD today. S/p LUE HeRO AVG/ RUE AVF ligation 7/2. Will start cannulation protocol with 17g needles.  3. Anemia - Hgb 10.5. On ESA as outpatient. Next ESA due 7/11 4. MBD- Phos/Ca controlled. Continue Ca acetate binder/Sensipar  5. Hypotension/volume - Chronic low BP on midodrine 10 mg TID, Below EDW here. No volume excess on exam. Lower EDW at discharge  6. 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   Recommend deep biopsy of the nodules If + for calcifications, will need to change warfarin to Eliquis amongst various other change 6. 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 To start Eliquis per primary 7.  [place]   Lynnda Child PA-C [facility] Pager [phone] 11/11/2017,9:19 AM  LOS: 9 days   Additional Objective Labs: Basic Metabolic Panel: Recent Labs  Lab 11/06/17 0500 11/07/17 0803 11/09/17 0432 11/10/17 0529 11/10/17 1253  NA 134* 137 134* 136 134*  K 3.5 3.3* 3.4* 3.5 3.9  CL 97* 96* 96* 97*  --   CO2 26 28 24 25   --   GLUCOSE 126* 83 74 82  --   BUN 21* 13 23* 16  --   CREATININE 6.78* 5.03* 7.49* 5.94*  --   CALCIUM 8.1* 8.1* 7.7* 7.8*  --   PHOS 2.7  --   --   --   --    CBC: Recent Labs  Lab 11/05/17 2013 11/06/17 0716 11/07/17 0803 11/09/17 0432 11/10/17 0529 11/10/17 1253  WBC 5.8 6.3 5.1 4.7 5.0  --   HGB 10.0* 9.3* 9.0* 8.7* 8.7* 10.5*  HCT 33.1* 30.4* 30.6* 28.4* 28.5* 31.0*  MCV 92.7 91.3 93.6 90.4 91.1  --   PLT 159 143* 127* 124* 129*  --    Blood Culture    Component Value Date/Time   SDES TISSUE 11/06/2017 1716   SPECREQUEST NONE 11/06/2017 1716   CULT  11/06/2017 1716    NO GROWTH 4 DAYS NO ANAEROBES ISOLATED; CULTURE IN PROGRESS FOR 5 DAYS Performed at [facility], [place] [address], [place], [place] [address]    REPTSTATUS PENDING 11/06/2017 1716    Cardiac Enzymes: No results for input(s): CKTOTAL, CKMB, CKMBINDEX,  TROPONINI in the last 168 hours. CBG: Recent Labs  Lab 11/06/17 1743  GLUCAP 97   Iron Studies: No results for input(s): IRON, TIBC, TRANSFERRIN, FERRITIN in the last 72 hours. Lab Results  Component Value Date   INR 1.73 11/10/2017   INR 1.85 11/09/2017   INR 2.23 11/08/2017   Medications: . sodium chloride 10 mL/hr at 11/06/17 1548  .  ceFAZolin (ANCEF) IV 2 g (11/09/17 1812)  . vancomycin 0 mL/hr at 11/06/17 2217   . ambrisentan  10 mg Oral Daily  . amiodarone  200 mg Oral QPM  . apixaban  2.5 mg Oral BID  . atorvastatin  40 mg Oral QPM  . calcium acetate  1,334 mg Oral TID WC  . Chlorhexidine Gluconate Cloth  6 each Topical Q0600  . Chlorhexidine Gluconate Cloth  6 each Topical Q0600  . cinacalcet  90 mg Oral QAC supper  .  docusate sodium  100 mg Oral BID  . levothyroxine  300 mcg Oral QAC breakfast  . midodrine  10 mg Oral TID WC  . multivitamin  1 tablet Oral QHS  . pantoprazole  40 mg Oral BID AC  . sucralfate  1 g Oral BID  . tadalafil  20 mg Oral Daily

## 2017-11-11 NOTE — Procedures (Signed)
Patient seen on Hemodialysis. Successfully cannulated LUA immediate stick HeRo AVG with 17G needles.  QB 250, UF goal 2.5L Treatment adjusted as needed.  Adam Shiley MD [facility]. Office # [phone] Pager # [phone] 1:40 PM

## 2017-11-11 NOTE — Progress Notes (Addendum)
Family Medicine Teaching Service Daily Progress Note Intern Pager: [phone]  Patient name: Adam [name]. Medical record number: [id number] Date of birth: 1961/01/24 Age: 57 y.o. Gender: male  Primary Care Provider: [name], MD Consultants: orthopedic surgery, nephrology Code Status: full  Pt Overview and Major Events to Date:  Admitted to [facility], transferred to [facility] for dialysis and treatment 6/24 Received dialysis on 6/26 Approved for discharge from ortho perspective 6/27, then decision to wash out knee made from patient's home orthopedist  Knee washout with fluid cultures recollected 6/28 Drain removed from joint 6/30 HeRO Graft Placement 7/2  Assessment and Plan: Adam [name]. is a 57 y.o. male presenting with right leg pain, redness, and swelling.  His medical history includes ESRD on dialysis MWF, last dialysis on 6/24, atrial fibrillation on coumadin, moderate aortic stenosis, PAH, nonischemic cardiomyopathy with removal of ICD in April this year following MSSA bacteremia due to AV fistula infection.  R knee pain and swelling, post washout:  Suspect culture-negative septic arthritis although inflammatory arthritis also a likely possibility.  S/p washout by Ortho 6/28; drain removed 6/30.  Per ID recommendations, coverage with ceftriaxone and vancomycin- duration of therapy to be decided based on results of culture.  Pt notes improving knee pain that is concentrated over incision site.  He has remained afebrile overnight without white count.  Wound cultures with no growth so far.   Of note, wound cx from [facility] ED on 6/24 negative which was prior to antibiotics.  Will discuss with ID for further clarification.  - follow up repeat blood cultures - no growth 5 days - follow up intraoperative fluid culture - no growth 4 days - ID recs for length of antibiotic therapy, ID states patient is able to be discharged from their standpoint and will continue  antibiotics with HD - continue tramadol 50 mg Q6H PRN, use judiciously due to patient's hypotension - Ortho note states patient ok for discharge today - PT recommends home PT  Painful subcutaneous masses: Three painful, swollen areas on L hip, axilla, and thoracolumbar area, with a new nodule on the R flank since hospitalization.  Denies new nodule since Friday.  Have grown from quarter size to grapefruit size since April and are tender to palpation.  [place] only positive for edema, no abscesses visualized.  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 - surgery consult outpatient, will need deep tissue biopsy of the subcutaneous tissue - Warfarin stopped pre-procedure - Start Eliquis 2.5mg  BID today  QTc prolongation: EKG on 6/29 reports a QTc interval of 545, but manually calculated, QTc is closer to normal range at 465 msec. - monitor use of zofran; no need to make medication adjustments currently  ESRD: MWF.  Takes midodrine 10 mg TID AC for hypotension and sensipar 90 mg daily at 1700.  Dr. Bridgett Larsson performed a RUE venography and determined that his right AV fistula will not be usable.  HeRO graft placed 7/2. Patient tolerated procedure well and site to be used for HD today. - Vascular surgery has signed off, available as needed - HD today - continue home midodrine and sensipar  Recent MSSA bacteremia: occurred in April 2019, cultured from AV fistula wound and blood.  AV fistula was ligated, and he is currently getting dialysis from a perm cath, although RUE fistula is maturing.  ICD was removed at that time given bacteremia.  A TEE was performed but results are not in system. - f/u repeat blood cultures - NG x 5 days  Anemia of chronic disease: Hemoglobin 8.7 on 7/2, appears to be around his baseline of ~9.  MCV in normal range.  Platelets are low at but also seem to be at baseline. -  continue to monitor   Atrial fibrillation: Regular rhythm during admission, takes amiodarone 200 mg daily.  Coumadin D/Cd prior to vascular procedure. Nephro recommended starting Eliquis 2.5mg  BID following HeRO insertion. - continue amiodarone - Eliquis started today  PAH: Echo on 08/18/17 showed an PA peak pressure of 59 mmHg. Takes ambrisentan 10 mg daily and tadalafil 20 mg daily. - continue home meds   HFpEF with aortic stenosis: Echo with EF of 60-65%, dilated IVC with elevated central venous pressure, moderate aortic stenosis with mean gradient of 25 mmHg. - administer fluids cautiously if needed   HLD: takes atorvastatin 40 mg daily - continue home atorvastatin  GERD: takes protonix 40 mg BID and sucralfate 1 g BID at home.  Patient denies acid reflux symptoms and states that he was started on Protonix 40mg  BID following endoscopy in April.  Sucralfate prescription was for 1 month and end date was 6/15. - per pharmacy recommendation, will decrease Protonix to 40 mg QD x 2 weeks and then discontinue - d/c sucralfate  Hypothyroidism: Iatrogenic d/t thyroidectomy for treatment of thyroid cancer.  Takes synthroid 300 mcg daily.  Last TSH was 3.73 in Sept 2018.  TSH 0.696 on 6/25 - continue synthroid   FEN/GI: renal diet PPx: SCDs  Disposition: continued inpatient stay, requiring IV antibiotics  Subjective:  Patient sitting up on edge of bed eating breakfast.  States that he is feeling well.  No pain at HeRO graft site.  Improving pain at R knee incision site.  Reports improving ROM in R knee.  States that he would like to go home today. Denies CP/SOB  Objective: Temp:  [97 F (36.1 C)-98.5 F (36.9 C)] 98.5 F (36.9 C) (07/03 0534) Pulse Rate:  [25-88] 80 (07/03 0534) Resp:  [13-22] 16 (07/03 0534) BP: (32-98)/(14-71) 86/32 (07/03 0534) SpO2:  [60 %-100 %] 95 % (07/03 0534)   Physical Exam: General: 57 yo male in NAD, sitting on edge of bed HEENT: NCAT Cardio: RRR, AVM  fistula murmur Lungs: Normal effort, CTAB, no crackles, wheezes, rhonchi Abdomen: + bowel sounds, soft, non-tender Extremities: R leg ace bandage, chronic venous stasis changes BLE Skin: warm, dry  Laboratory: Recent Labs  Lab 11/07/17 0803 11/09/17 0432 11/10/17 0529 11/10/17 1253  WBC 5.1 4.7 5.0  --   HGB 9.0* 8.7* 8.7* 10.5*  HCT 30.6* 28.4* 28.5* 31.0*  PLT 127* 124* 129*  --    Recent Labs  Lab 11/07/17 0803 11/09/17 0432 11/10/17 0529 11/10/17 1253  NA 137 134* 136 134*  K 3.3* 3.4* 3.5 3.9  CL 96* 96* 97*  --   CO2 28 24 25   --   BUN 13 23* 16  --   CREATININE 5.03* 7.49* 5.94*  --   CALCIUM 8.1* 7.7* 7.8*  --   GLUCOSE 83 74 82  --     Imaging/Diagnostic Tests: Dg Chest 1 View  Result Date: 11/07/2017 CLINICAL DATA:  Pre-op chest exam for fistula EXAM: CHEST  1 VIEW COMPARISON:  09/24/2017 FINDINGS: Lungs are clear. Improvement in pulmonary vascular congestion. Stable tunneled left IJ hemodialysis catheter to the proximal right atrium. Left axillary vascular stents as before. Stable cardiomegaly.  Aortic Atherosclerosis (ICD10-170.0). No effusion. Visualized bones unremarkable. IMPRESSION: Stable cardiomegaly.  No acute findings. Electronically Signed   By: Lucrezia Europe M.D.   On: 11/07/2017 08:22   Dg Chest Port 1 View  Result Date: 11/10/2017 CLINICAL DATA:  Left-sided dialysis catheter present, history of end-stage renal disease EXAM: PORTABLE CHEST 1 VIEW COMPARISON:  Portable chest x-ray of 11/07/2017 FINDINGS: Aeration of the lungs has improved. The left sided hemodialysis catheter tip overlies expected SVC-RA junction. The lungs appear clear. Mild cardiomegaly is stable. Vascular stent is noted overlying the left axilla. IMPRESSION: 1. No change in position of left dialysis catheter with tip overlying the expected SVC-RA junction. 2. Stable mild cardiomegaly.  No active lung disease. Electronically Signed   By: Ivar Drape M.D.   On: 11/10/2017 13:53   Dg  Fluoro Guide Cv Line-no Report  Result Date: 11/10/2017 Fluoroscopy was utilized by the requesting physician.  No radiographic interpretation.   [place] Chest Soft Tissue  Result Date: 11/03/2017 CLINICAL DATA:  Diffuse swelling along the LEFT back, flank, chest and axilla EXAM: ULTRASOUND OF HEAD/NECK SOFT TISSUES TECHNIQUE: Ultrasound examination of the LEFT flank, back, chest and axillary soft tissues was performed in the area of clinical concern. COMPARISON:  None FINDINGS: Diffuse subcutaneous edema along the LEFT flank, back, chest and axilla identified. No discrete abscess. No other abnormalities noted. IMPRESSION: Diffuse edema along the LEFT back, chest and axilla. No discrete abscess. Electronically Signed   By: Margarette Canada M.D.   On: 11/03/2017 18:50     Rittberger, Bernita Raisin, DO 11/11/2017, 6:53 AM PGY-1, [place]

## 2017-11-11 NOTE — Discharge Summary (Addendum)
[facility] Discharge Summary  Patient name: Adam [name]. Medical record number: [id number] Date of birth: January 06, 1961 Age: 57 y.o. 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 ESRD Recent MSSA bacteremia Anemia of chronic disease Atrial fibrillation PAH HFpEF with aortic stenosis HLD GERD Hypothyroidism  Disposition: home with home PT  Discharge Condition: stable  Discharge Exam:  Physical Exam: General: 57 yo male in NAD, sitting on edge of bed HEENT: NCAT Cardio: RRR, AVM fistula murmur Lungs: Normal effort, CTAB, no crackles, wheezes, rhonchi Abdomen: + bowel sounds, soft, non-tender Extremities: R leg ace bandage, chronic venous stasis changes BLE Skin: warm, dry    Brief Hospital Course:  Adam [name] was admitted on 11/02/17 after presenting to the [facility] ED for right knee pain and swelling.  He was transferred to [facility] since he would need dialysis during hospitalization.  His knee was aspirated in the [facility] ED and a WBC of 45,000 with 90% neutrophils was found.  Gram stain did not show any organisms, but the fluid was cultured.  Blood cultures were also obtained.  Due to patient's MSSA bacteremia in April 2019, concern was high for septic arthritis even though his WBC count was below the threshold of 50,000 commonly used for septic arthritis.  He was started on vancomycin after consulting orthopedics.  Dr. Alvan Dame, orthopedist, assessed patient on 6/25 and advised that if fluid and blood cultures returned with no  growth, vancomycin could be stopped and the patient could be discharged, since the joint swelling would likely be due to inflammation rather than infection.  On 6/26, vancomycin was stopped given no growth in fluid or blood cultures for >24 hours and improvement in patient's knee pain and mobility.  On 6/27, patient's knee continued to improve off of vancomycin and cultures showed no growth x 48 hours, so patient was prepared for discharge after Dr. Alvan Dame gave approval.  However, patient's wife was worried that his pain was increasing, so she called patient's outpatient orthopedist, Dr. Lyla Glassing, who determined that patient's knee warranted a washout given presence of cloudy fluid during aspiration and recent MSSA bacteremia.  Washout was performed on 6/28, and pain was controlled with Tramadol after this procedure.  ID was consulted about antibiotic regimen and advised that vancomycin and ancef be started after fluid cultures were collected during the washout.  Repeat blood cultures were also collected.  No growth resulted from these repeat fluid and blood cultures.  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  They recommended deep tissue biopsy by outpatient general surgery and transitioning to Eliquis rather than warfarin.  Vascular surgery also performed RUE venography to see if a new AV fistula could be placed there, but determined that this could not be done.  They then performed ligation of right brachial vein transposition with left HeRO graft-catheter placement on 11/10/2017.    Prior to discharge, patient's wife voiced concerns about the patient's mental status as he has been more forgetful since his MSSA bacteremia.  Patient agreed that he had been more forgetful, but felt that it was  likely due to being overwhelmed with his physical state and being in the hospital.  MMSE was 30/30, no  concern for cognitive impairment.   Issues for Follow Up:  1. Patient needs a general surgery consult for a skin biopsy.  Biopsy order must include possible calciphylaxis as reason for biopsy. 2. Once cleared by vascular surgery, warfarin should be stopped and Eliquis 2.5 mg BID started per nephrology recommendations since warfarin can increase the risk of calciphylaxis. 3. Patient will continue to receive ancef and vancomycin with hemodialysis until reaches 4 weeks of treatment on 7/26 4. Patient's protonix decreased from 40mg  BID to QD.  He is to take for two weeks and then discontinue.  He is asymptomatic and been taking the medication since Endoscopy in April for bleeding concern. 5. Patient to discontinue taking Sucralfate, as original prescription only for 1 month, which ended 6/15. 6. Follow up with patient's forgetfulness to see if improvement following discharge from hospital and time spent at home.  Significant Procedures: RUE venography, right knee washout, left HeRO graft-catheter placement  Significant Labs and Imaging:  Recent Labs  Lab 11/09/17 0432 11/10/17 0529 11/10/17 1253 11/11/17 1307  WBC 4.7 5.0  --  6.2  HGB 8.7* 8.7* 10.5* 8.3*  HCT 28.4* 28.5* 31.0* 26.9*  PLT 124* 129*  --  139*   Recent Labs  Lab 11/06/17 0500 11/07/17 0803 11/09/17 0432 11/10/17 0529 11/10/17 1253 11/11/17 1307  NA 134* 137 134* 136 134* 135  K 3.5 3.3* 3.4* 3.5 3.9 3.4*  CL 97* 96* 96* 97*  --  99  CO2 26 28 24 25   --  22  GLUCOSE 126* 83 74 82  --  125*  BUN 21* 13 23* 16  --  27*  CREATININE 6.78* 5.03* 7.49* 5.94*  --  7.30*  CALCIUM 8.1* 8.1* 7.7* 7.8*  --  8.3*  PHOS 2.7  --   --   --   --  2.4*  ALBUMIN 3.0*  --   --   --   --  2.9*      Results/Tests Pending at Time of Discharge: Blood culture no growth at 5 days, fluid cultures no growth at 4 days  Discharge Medications:  Allergies as of 11/12/2017      Reactions   Enalaprilat Swelling, Other (See Comments)    Mouth swelling   Vasotec [enalapril] Swelling, Other (See Comments)   MOUTH SWELLING   Iodinated Diagnostic Agents Nausea And Vomiting, Nausea Only      Medication List    STOP taking these medications   sucralfate 1 g tablet Commonly known as:  CARAFATE   warfarin 6 MG tablet Commonly known as:  COUMADIN     TAKE these medications   ambrisentan 10 MG tablet Commonly known as:  LETAIRIS Take 1 tablet (10 mg total) by mouth daily.   amiodarone 200 MG tablet Commonly known as:  PACERONE Take 200 mg by mouth every evening.   apixaban 2.5 MG Tabs tablet Commonly known as:  ELIQUIS Take 1 tablet (2.5 mg total) by mouth 2 (two) times daily.   atorvastatin 40 MG tablet Commonly known as:  LIPITOR Take 1 tablet (40 mg total) by mouth every evening.   calcium acetate 667 MG capsule Commonly known as:  PHOSLO Take 2 capsules (1,334 mg total) by mouth 3 (three) times daily with meals.   ceFAZolin 2-4 GM/100ML-% IVPB Commonly known as:  ANCEF Inject 100 mLs (2 g total) into the vein every  Monday, Wednesday, and Friday at 6 PM for 10 doses.   cinacalcet 30 MG tablet Commonly known as:  SENSIPAR Take 3 tablets (90 mg total) by mouth every evening.   levothyroxine 300 MCG tablet Commonly known as:  SYNTHROID, LEVOTHROID Take 1 tablet (300 mcg total) by mouth daily before breakfast.   midodrine 10 MG tablet Commonly known as:  PROAMATINE TAKE 1 TABLET (10 MG TOTAL) BY MOUTH 3 (THREE) TIMES DAILY.   multivitamin Tabs tablet TAKE 1 TABLET BY MOUTH EVERYDAY AT BEDTIME   pantoprazole 40 MG tablet Commonly known as:  PROTONIX Take 1 tablet (40 mg total) by mouth daily. For 14 days, then stop. What changed:    when to take this  additional instructions   tadalafil 20 MG tablet Commonly known as:  ADCIRCA/CIALIS Take 20 mg by mouth daily.   traMADol 50 MG tablet Commonly known as:  ULTRAM Take 1 tablet (50 mg total) by mouth every 6 (six) hours as needed for severe  pain.   vancomycin 1-5 GM/200ML-% Soln Commonly known as:  VANCOCIN Inject 200 mLs (1,000 mg total) into the vein every Monday, Wednesday, and Friday with hemodialysis for 10 doses.            Durable Medical Equipment  (From admission, onward)        Start     Ordered   11/12/17 1112  For home use only DME Hospital bed  Once    Question Answer Comment  Patient has (list medical condition): septic arthritis of R knee   The above medical condition requires: Patient requires the ability to reposition frequently   Head must be elevated greater than: 30 degrees   Bed type Semi-electric      11/12/17 1112   11/11/17 1029  For home use only DME 3 n 1  Once     11/11/17 1029   11/10/17 1606  For home use only DME Walker rolling  Once    Comments:  3 in 1  Question:  Patient needs a walker to treat with the following condition  Answer:  Septic arthritis ([place])   11/10/17 1606   11/10/17 1540  For home use only DME Shower stool  Once    Comments:  Septic Arthritis   11/10/17 1541      Discharge Instructions: Please refer to Patient Instructions section of EMR for full details.  Patient was counseled important signs and symptoms that should prompt return to medical care, changes in medications, dietary instructions, activity restrictions, and follow up appointments.  Follow-Up Appointments: Follow-up Information    Swinteck, Aaron Edelman, MD. Schedule an appointment as soon as possible for a visit in 2 weeks.   Specialty:  Orthopedic Surgery Why:  For suture removal Contact information: [address] [place] [address] [phone]        [facility]-Home Follow up.   Specialty:  Home Health Services Why:  For home health PT, a RW and a 3/1 will be delivered to the room prior to DC.  Contact information: [place] [address] [phone]           Rittberger, Bernita Raisin, DO 11/12/2017, 1:11 PM PGY-1, [place]

## 2017-11-11 NOTE — Discharge Instructions (Addendum)
Weightbearing as tolerated right lower extremity. In 4 days, you may remove the dressing and begin taking showers.  No soaking or submerging. Continue antibiotics as prescribed. Take Protonix 40mg  daily for 2 weeks and then stop taking it. If you are still taking Sucralfate, please stop.  It was only needed for one month.  Please follow up with your primary care provider about your changes in memory.  Information on my medicine - ELIQUIS (apixaban)  This medication education was reviewed with me or my healthcare representative as part of my discharge preparation.  The pharmacist that spoke with me during my hospital stay was:  Horton Chin, PharmD.  Why was Eliquis prescribed for you? Eliquis was prescribed for you to reduce the risk of a blood clot forming that can cause a stroke if you have a medical condition called atrial fibrillation (a type of irregular heartbeat).  What do You need to know about Eliquis ? Take your Eliquis TWICE DAILY - one tablet in the morning and one tablet in the evening with or without food. If you have difficulty swallowing the tablet whole please discuss with your pharmacist how to take the medication safely.  Take Eliquis exactly as prescribed by your doctor and DO NOT stop taking Eliquis without talking to the doctor who prescribed the medication.  Stopping may increase your risk of developing a stroke.  Refill your prescription before you run out.  After discharge, you should have regular check-up appointments with your healthcare provider that is prescribing your Eliquis.  In the future your dose may need to be changed if your kidney function or weight changes by a significant amount or as you get older.  What do you do if you miss a dose? If you miss a dose, take it as soon as you remember on the same day and resume taking twice daily.  Do not take more than one dose of ELIQUIS at the same time to make up a missed dose.  Important Safety  Information A possible side effect of Eliquis is bleeding. You should call your healthcare provider right away if you experience any of the following: ? Bleeding from an injury or your nose that does not stop. ? Unusual colored urine (red or dark brown) or unusual colored stools (red or black). ? Unusual bruising for unknown reasons. ? A serious fall or if you hit your head (even if there is no bleeding).  Some medicines may interact with Eliquis and might increase your risk of bleeding or clotting while on Eliquis. To help avoid this, consult your healthcare provider or pharmacist prior to using any new prescription or non-prescription medications, including herbals, vitamins, non-steroidal anti-inflammatory drugs (NSAIDs) and supplements.  This website has more information on Eliquis (apixaban): http://www.eliquis.com/eliquis/home

## 2017-11-11 NOTE — Progress Notes (Signed)
Pt gone to dialysis via bed.  Midodrine given prior to departure.

## 2017-11-12 ENCOUNTER — Encounter: Payer: Self-pay | Admitting: Cardiology

## 2017-11-12 ENCOUNTER — Encounter: Payer: Self-pay | Admitting: Podiatry

## 2017-11-12 MED ORDER — DARBEPOETIN ALFA 200 MCG/0.4ML IJ SOSY: 200.0000 ug | PREFILLED_SYRINGE | INTRAMUSCULAR | Status: DC

## 2017-11-12 NOTE — Care Management (Signed)
    Durable Medical Equipment  (From admission, onward)        Start     Ordered   11/12/17 1112  For home use only DME Hospital bed  Once    Question Answer Comment  Patient has (list medical condition): septic arthritis of R knee   The above medical condition requires: Patient requires the ability to reposition frequently   Head must be elevated greater than: 30 degrees   Bed type Semi-electric      11/12/17 1112   11/11/17 1029  For home use only DME 3 n 1  Once     11/11/17 1029   11/10/17 1606  For home use only DME Walker rolling  Once    Comments:  3 in 1  Question:  Patient needs a walker to treat with the following condition  Answer:  Septic arthritis ([place])   11/10/17 1606   11/10/17 1540  For home use only DME Shower stool  Once    Comments:  Septic Arthritis   11/10/17 1541

## 2017-11-12 NOTE — Care Management (Signed)
Patient will need hospital bed, unable to ambulate flight of stairs to upper level of home, s/p right knee arthrotomy and drainage on 6/28.

## 2017-11-12 NOTE — Progress Notes (Signed)
Pharmacy Antibiotic Note  Adam [name]. is a 57 y.o. male admitted on 11/02/2017 with septic arthritis.  Pharmacy has been consulted for Vancomycin and Ancef dosing.  Plan per ID is to continue IV abx with HD for 6 weeks (thru 12/18/17).  Last HD session 7/3 (4hr BFR 400) with Vanc dose given.  Unable to give Ancef dose with HD per HD RN and pt does not have IV access for floor RN to admin.  Plan: Continue Vancomycin 1gm IV qHD (MWF) Continue Ancef 2gm IV qHD (MWF) Will order pre-HD Vanc level for 7/5.  Goal 15-25.  Height: 6\' 1"  (185.4 cm) Weight: 276 lb 14.4 oz (125.6 kg) IBW/kg (Calculated) : 79.9  Temp (24hrs), Avg:98.2 F (36.8 C), Min:97.4 F (36.3 C), Max:99.7 F (37.6 C)  Recent Labs  Lab 11/06/17 0500 11/06/17 0716 11/07/17 0803 11/09/17 0432 11/10/17 0529 11/11/17 1307  WBC  --  6.3 5.1 4.7 5.0 6.2  CREATININE 6.78*  --  5.03* 7.49* 5.94* 7.30*    Estimated Creatinine Clearance: 15.9 mL/min (A) (by C-G formula based on SCr of 7.3 mg/dL (H)).    Allergies  Allergen Reactions  . Enalaprilat Swelling and Other (See Comments)    Mouth swelling  . Vasotec [Enalapril] Swelling and Other (See Comments)    MOUTH SWELLING  . Iodinated Diagnostic Agents Nausea And Vomiting and Nausea Only    Antimicrobials this admission: Rocephin 6/28>> 6/30 Vancomycin 6/24>> (8/9) Cefazolin 6/30 >> (8/9) **dose missed 7/3 with HD, no other IV access   Dose adjustments this admission:   Microbiology results: 6/24 Knee aspirate: NGTD 6/25 & 6/29 MRSA PCR >> neg 6/25 BCx >> negative 6/27 BCx >> negative 6/28: Tissue cx x 2>>  Thank you for allowing pharmacy to be a part of this patient's care.  [employer], Pharm.D., BCPS Clinical Pharmacist Pager: [phone] Clinical phone for 11/12/2017 from 8:30-4:00 is [phone].  **Pharmacist phone directory can now be found on [email or web address] (PW [email or web address]).  Listed under [place].  11/12/2017 10:48 AM

## 2017-11-12 NOTE — Progress Notes (Signed)
Physical Therapy Treatment Patient Details Name: Adam [name]. MRN: [id number] DOB: May 29, 1960 Today's Date: 11/12/2017    History of Present Illness Pt is a 57 y.o. male admitted 11/02/17 with c/o R knee pain; suspected R knee septic arthritis. Now s/p R knee arthrotomy and drainage 6/28. 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 PMH includes ESRD (HD MWF), a-fib, HTN, cardiac debibrillator, subcutaneous nodules.    PT Comments    Patient continuing to progress very well towards his goals, ambulating 250 feet with walker demonstrating improved gait speed and posture. Still presenting with gross RLE weakness and also generalized LLE weakness as well as gait abnormalities. Per patient, he and his wife are now interested in [place] rehab at discharge as patient wife does not feel that she can provide appropriate amount of assistance with walking and negotiating steps. Discharge plan updated to reflect SNF at discharge in order to maximize patient's functional independence and decrease caregiver burden.    Follow Up Recommendations  SNF     Equipment Recommendations  Rolling walker with 5" wheels;3in1 (PT)    Recommendations for Other Services       Precautions / Restrictions Precautions Precautions: Fall Restrictions Weight Bearing Restrictions: Yes RLE Weight Bearing: Weight bearing as tolerated    Mobility  Bed Mobility Overal bed mobility: Modified Independent                Transfers Overall transfer level: Needs assistance Equipment used: Rolling walker (2 wheeled) Transfers: Sit to/from Stand Sit to Stand: Supervision            Ambulation/Gait Ambulation/Gait assistance: Min guard Gait Distance (Feet): 250 Feet Assistive device: Rolling walker (2 wheeled);Straight cane Gait Pattern/deviations: Step-through pattern;Decreased stride length;Decreased weight shift to right Gait velocity: Decreased   General Gait Details: Patient with  improved gait speed and upright posture. Cueing for right heel strike at initial contact and decreased right foot external rotation to promote increased knee flexion   Stairs             Wheelchair Mobility    Modified Rankin (Stroke Patients Only)       Balance Overall balance assessment: Needs assistance Sitting-balance support: No upper extremity supported;Feet supported Sitting balance-Leahy Scale: Normal     Standing balance support: Single extremity supported;During functional activity Standing balance-Leahy Scale: Fair                              Cognition Arousal/Alertness: Awake/alert Behavior During Therapy: WFL for tasks assessed/performed Overall Cognitive Status: Within Functional Limits for tasks assessed                                        Exercises General Exercises - Lower Extremity Long Arc Quad: 10 reps;Right;Seated Heel Slides: Right;10 reps;Seated    General Comments        Pertinent Vitals/Pain Pain Assessment: Faces Faces Pain Scale: Hurts little more Pain Location: anterior right knee; some clicking with movement Pain Descriptors / Indicators: Sore;Discomfort Pain Intervention(s): Monitored during session    Home Living                      Prior Function            PT Goals (current goals can now be found in the care plan section)  Acute Rehab PT Goals Patient Stated Goal: Return home PT Goal Formulation: With patient Time For Goal Achievement: 11/21/17 Potential to Achieve Goals: Good Progress towards PT goals: Progressing toward goals    Frequency    Min 5X/week      PT Plan Discharge plan needs to be updated    Co-evaluation              AM-PAC PT "6 Clicks" Daily Activity  Outcome Measure  Difficulty turning over in bed (including adjusting bedclothes, sheets and blankets)?: A Little Difficulty moving from lying on back to sitting on the side of the bed? : A  Little Difficulty sitting down on and standing up from a chair with arms (e.g., wheelchair, bedside commode, etc,.)?: A Little Help needed moving to and from a bed to chair (including a wheelchair)?: A Little Help needed walking in hospital room?: A Little Help needed climbing 3-5 steps with a railing? : A Little 6 Click Score: 18    End of Session Equipment Utilized During Treatment: Gait belt Activity Tolerance: Patient tolerated treatment well Patient left: with call bell/phone within reach;Other (comment)(sitting EOB) Nurse Communication: Mobility status PT Visit Diagnosis: Unsteadiness on feet (R26.81);Other abnormalities of gait and mobility (R26.89);Pain Pain - Right/Left: Right Pain - part of body: Knee     Time: 0802-0825 PT Time Calculation (min) (ACUTE ONLY): 23 min  Charges:  $Gait Training: 8-22 mins $Therapeutic Exercise: 8-22 mins                    G Codes:       [name], PT, DPT Acute Rehabilitation Services  Pager: [phone]    [name] 11/12/2017, 9:17 AM

## 2017-11-12 NOTE — Progress Notes (Signed)
FMTS Attending Daily Note:  Chrisandra Netters MD Personal pager:  [phone] FPTS Service Pager:  [phone]  I have seen and examined this patient and have reviewed their chart. I have discussed this patient with the resident.   Patient seen this afternoon. Feeling well. Wants to go home, as SNF placement will take too long. Patient without complaints. Sitting up on side of bed, speaking full sentences on room air.  Stable for discharge home today. To continue IV antibiotics at HD as outpatient.  Will sign resident note when available.  Leeanne Rio, MD 11/12/2017

## 2017-11-12 NOTE — Progress Notes (Addendum)
[facility] Progress Note   Subjective:  Seen in room. No complaints. Awaiting SNF placement  Objective Vitals:   11/11/17 1738 11/11/17 2104 11/12/17 0451 11/12/17 0919  BP:  (!) 78/39 (!) 95/35 (!) 86/46  Pulse:  84 72 77  Resp:  20 20 18   Temp:  (!) 97.4 F (36.3 C) 99.7 F (37.6 C) (!) 97.5 F (36.4 C)  TempSrc:  Oral Oral Oral  SpO2:  100% 96% 100%  Weight: 125.6 kg (276 lb 14.4 oz)     Height:       Physical Exam General: WNWD male lying in bed NAD Heart: Irregular rhythm 2/6 SEM  Lungs: CTAB Abdomen: soft NT/ND Extremities: R knee bandaged; Trace LE edema Dialysis Access: LUE HeRO AVG +bruit; RUE AVF ligated  some arm swelling   Dialysis Orders:  MWF at [facility] 4hr, 450/800, EDW 124.5kg, 3K/2.25Ca, AVF + TDC, no heparin (on warfarin) - Mircera 234mcg IV q 2 weeks (last 6/17) - Venofer 100mg  x 10 ordered    Assessment/Plan: 1. Right kneepain /effusion: Gram stain with >45000 WBC, negative blood and synovial fluid cultures with empiric intravenous vancomycin.  Status post surgical washout of right knee joint space and currently ongoing physical therapy. Continue Vanc/cefazolin for 4 weeks (until 12/07/17) 2. ESRD -MWF. Next HD 7/5  S/p LUE HeRO AVG/ RUE AVF ligation 7/2. Will start cannulation protocol with 17g needles.  3. Anemia - Hgb 8.3. Aranesp 200 mcg scheduled for 7/5  4. MBD- Phos/Ca controlled. Continue Ca acetate binder/Sensipar  5. Hypotension/volume - Chronic low BP on midodrine 10 mg TID, Weights variable. No volume excess on exam. 6. 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   Recommend deep biopsy of the nodules If + for calcifications, will need to change warfarin to Eliquis amongst various other change 6. 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 To start Eliquis per primary 7.  [place]  Dispo: pending SNF placement   Lynnda Child PA-C [facility] Pager [phone] 11/12/2017,9:54 AM  LOS: 10 days   Additional Objective Labs: Basic Metabolic Panel: Recent Labs  Lab 11/06/17 0500  11/09/17 0432 11/10/17 0529 11/10/17 1253 11/11/17 1307  NA 134*   < > 134* 136 134* 135  K 3.5   < > 3.4* 3.5 3.9 3.4*  CL 97*   < > 96* 97*  --  99  CO2 26   < > 24 25  --  22  GLUCOSE 126*   < > 74 82  --  125*  BUN 21*   < > 23* 16  --  27*  CREATININE 6.78*   < > 7.49* 5.94*  --  7.30*  CALCIUM 8.1*   < > 7.7* 7.8*  --  8.3*  PHOS 2.7  --   --   --   --  2.4*   < > = values in this interval not displayed.   CBC: Recent Labs  Lab 11/06/17 0716 11/07/17 0803 11/09/17 0432 11/10/17 0529 11/10/17 1253 11/11/17 1307  WBC 6.3 5.1 4.7 5.0  --  6.2  HGB 9.3* 9.0* 8.7* 8.7* 10.5* 8.3*  HCT 30.4* 30.6* 28.4* 28.5* 31.0* 26.9*  MCV 91.3 93.6 90.4 91.1  --  90.0  PLT 143* 127* 124* 129*  --  139*   Blood Culture    Component Value Date/Time   SDES TISSUE 11/06/2017 1716   SPECREQUEST NONE 11/06/2017 1716   CULT  11/06/2017 1716  No growth aerobically or anaerobically. Performed at [facility], [place] [address] [place], [place] [address]    REPTSTATUS 11/11/2017 FINAL 11/06/2017 1716    Cardiac Enzymes: No results for input(s): CKTOTAL, CKMB, CKMBINDEX, TROPONINI in the last 168 hours. CBG: Recent Labs  Lab 11/06/17 1743  GLUCAP 97   Iron Studies: No results for input(s): IRON, TIBC, TRANSFERRIN, FERRITIN in the last 72 hours. Lab Results  Component Value Date   INR 1.73 11/10/2017   INR 1.85 11/09/2017   INR 2.23 11/08/2017   Medications: . sodium chloride 10 mL/hr at 11/06/17 1548  .  ceFAZolin (ANCEF) IV 2 g (11/09/17 1812)  . vancomycin 0 mL/hr at 11/06/17 2217   . ambrisentan  10 mg Oral Daily  . amiodarone  200 mg Oral QPM  . apixaban  2.5 mg Oral BID  . atorvastatin  40 mg Oral QPM  . calcium acetate  1,334 mg Oral TID WC  . Chlorhexidine Gluconate Cloth  6 each Topical Q0600  . Chlorhexidine  Gluconate Cloth  6 each Topical Q0600  . cinacalcet  90 mg Oral QAC supper  . docusate sodium  100 mg Oral BID  . levothyroxine  300 mcg Oral QAC breakfast  . midodrine  10 mg Oral TID WC  . multivitamin  1 tablet Oral QHS  . pantoprazole  40 mg Oral Daily  . sucralfate  1 g Oral BID  . tadalafil  20 mg Oral Daily

## 2017-11-12 NOTE — Care Management Note (Signed)
Case Management Note  Patient Details  Name: Adam [name]. MRN: [id number] Date of Birth: [birth year]  Subjective/Objective:                    Action/Plan: Case manager spoke with patient concerning discharge plan. Patient's wife has requested hospital bed for home. CM asked RN to contact MD for order, notified [place] Liaison of need for bed.    Expected Discharge Date:  11/12/17               Expected Discharge Plan:  [place]  In-House Referral:     Discharge planning Services  CM Consult  Post Acute Care Choice:  Home Health, Durable Medical Equipment Choice offered to:  Patient  DME Arranged:  3-N-1, Walker rolling, Hospital bed DME Agency:  [place]:  PT [place]:  [place]  Status of Service:  Completed, signed off  If discussed at [place] of Stay Meetings, dates discussed:    Additional Comments:  Ninfa Meeker, RN 11/12/2017, 11:20 AM

## 2017-11-12 NOTE — Progress Notes (Signed)
Discharge instructions provided to patient. Patient verbalized understanding of follow up appts and medication administration especially antibiotics using teach back. Bartholomew Crews, RN

## 2017-11-12 NOTE — Clinical Social Work Note (Signed)
CSW received consult for SNF placement this morning and chart reviewed. Physical therapy consistently requested HH PT and the PT session on 7/4 recommended SNF, however patient ambulated 250 feet with a walker. CSW also noted that OT signed off. CSW visited room and talked with patient and wife by phone informing them that patient's insurance - [employer] would not authorize SNF placement due to his PT/OT evals and functional status. Wife expressed concern regarding patient coming home and his inability to climb the stairs to their bedroom and this was discussed. CSW suggested a bed in the main floor living area and wife was agreeable. She was advised that nurse case manager would be contacted. Mrs. Murdaugh questions answered and concerns addressed. Contact made with Manuela Schwartz, nurse care manager and made request for hospital bed. CSW signing off as no further SW intervention services needed and patient discharging home today.  Anabeth Chilcott Givens, MSW, LCSW Licensed Clinical Social Worker [address] [phone]

## 2017-11-13 ENCOUNTER — Other Ambulatory Visit: Payer: Self-pay

## 2017-11-13 DIAGNOSIS — J9601 Acute respiratory failure with hypoxia: Secondary | ICD-10-CM | POA: Diagnosis not present

## 2017-11-13 DIAGNOSIS — Z95810 Presence of automatic (implantable) cardiac defibrillator: Secondary | ICD-10-CM | POA: Diagnosis not present

## 2017-11-13 DIAGNOSIS — T8189XA Other complications of procedures, not elsewhere classified, initial encounter: Secondary | ICD-10-CM | POA: Diagnosis not present

## 2017-11-13 DIAGNOSIS — R7881 Bacteremia: Secondary | ICD-10-CM | POA: Diagnosis not present

## 2017-11-13 DIAGNOSIS — D631 Anemia in chronic kidney disease: Secondary | ICD-10-CM | POA: Diagnosis not present

## 2017-11-13 DIAGNOSIS — N186 End stage renal disease: Secondary | ICD-10-CM | POA: Diagnosis not present

## 2017-11-13 DIAGNOSIS — A419 Sepsis, unspecified organism: Secondary | ICD-10-CM | POA: Diagnosis not present

## 2017-11-13 DIAGNOSIS — I6529 Occlusion and stenosis of unspecified carotid artery: Secondary | ICD-10-CM | POA: Diagnosis not present

## 2017-11-13 DIAGNOSIS — T829XXA Unspecified complication of cardiac and vascular prosthetic device, implant and graft, initial encounter: Secondary | ICD-10-CM | POA: Diagnosis not present

## 2017-11-13 DIAGNOSIS — M25461 Effusion, right knee: Secondary | ICD-10-CM | POA: Diagnosis not present

## 2017-11-13 DIAGNOSIS — E669 Obesity, unspecified: Secondary | ICD-10-CM | POA: Diagnosis not present

## 2017-11-13 DIAGNOSIS — T8130XA Disruption of wound, unspecified, initial encounter: Secondary | ICD-10-CM | POA: Diagnosis not present

## 2017-11-13 DIAGNOSIS — N2581 Secondary hyperparathyroidism of renal origin: Secondary | ICD-10-CM | POA: Diagnosis not present

## 2017-11-17 ENCOUNTER — Ambulatory Visit: Payer: Medicare HMO | Admitting: Podiatry

## 2017-11-17 ENCOUNTER — Ambulatory Visit: Payer: Medicare HMO

## 2017-11-17 DIAGNOSIS — D649 Anemia, unspecified: Secondary | ICD-10-CM | POA: Diagnosis not present

## 2017-11-17 DIAGNOSIS — M779 Enthesopathy, unspecified: Secondary | ICD-10-CM

## 2017-11-17 DIAGNOSIS — I739 Peripheral vascular disease, unspecified: Secondary | ICD-10-CM | POA: Diagnosis not present

## 2017-11-17 DIAGNOSIS — M00861 Arthritis due to other bacteria, right knee: Secondary | ICD-10-CM | POA: Diagnosis not present

## 2017-11-17 DIAGNOSIS — I350 Nonrheumatic aortic (valve) stenosis: Secondary | ICD-10-CM | POA: Diagnosis not present

## 2017-11-17 DIAGNOSIS — M79675 Pain in left toe(s): Secondary | ICD-10-CM

## 2017-11-17 DIAGNOSIS — M79674 Pain in right toe(s): Secondary | ICD-10-CM

## 2017-11-17 DIAGNOSIS — I420 Dilated cardiomyopathy: Secondary | ICD-10-CM | POA: Diagnosis not present

## 2017-11-17 DIAGNOSIS — B351 Tinea unguium: Secondary | ICD-10-CM | POA: Diagnosis not present

## 2017-11-17 DIAGNOSIS — L84 Corns and callosities: Secondary | ICD-10-CM | POA: Diagnosis not present

## 2017-11-17 DIAGNOSIS — E1149 Type 2 diabetes mellitus with other diabetic neurological complication: Secondary | ICD-10-CM

## 2017-11-17 DIAGNOSIS — I272 Pulmonary hypertension, unspecified: Secondary | ICD-10-CM | POA: Diagnosis not present

## 2017-11-17 DIAGNOSIS — N186 End stage renal disease: Secondary | ICD-10-CM | POA: Diagnosis not present

## 2017-11-17 DIAGNOSIS — E039 Hypothyroidism, unspecified: Secondary | ICD-10-CM | POA: Diagnosis not present

## 2017-11-17 DIAGNOSIS — I120 Hypertensive chronic kidney disease with stage 5 chronic kidney disease or end stage renal disease: Secondary | ICD-10-CM | POA: Diagnosis not present

## 2017-11-17 DIAGNOSIS — I4891 Unspecified atrial fibrillation: Secondary | ICD-10-CM | POA: Diagnosis not present

## 2017-11-17 DIAGNOSIS — E211 Secondary hyperparathyroidism, not elsewhere classified: Secondary | ICD-10-CM | POA: Diagnosis not present

## 2017-11-18 ENCOUNTER — Other Ambulatory Visit: Payer: Self-pay

## 2017-11-18 ENCOUNTER — Ambulatory Visit: Payer: Medicare HMO | Admitting: Family Medicine

## 2017-11-18 ENCOUNTER — Encounter: Payer: Self-pay | Admitting: Family Medicine

## 2017-11-18 VITALS — BP 128/88 | HR 94 | Temp 97.5°F | Ht 73.0 in | Wt 273.0 lb

## 2017-11-18 DIAGNOSIS — L987 Excessive and redundant skin and subcutaneous tissue: Secondary | ICD-10-CM | POA: Diagnosis not present

## 2017-11-18 DIAGNOSIS — R222 Localized swelling, mass and lump, trunk: Secondary | ICD-10-CM | POA: Diagnosis not present

## 2017-11-18 MED ORDER — NYSTATIN 100000 UNIT/GM EX POWD: Freq: Four times a day (QID) | CUTANEOUS | 0 refills | Status: AC

## 2017-11-18 NOTE — Patient Instructions (Signed)
It was great seeing you today! We have addressed the following issues today  1. I refer you to surgery for biopsy of your nodules. 2. Stop the protonix in a week. 3. Discontinue the sulcrafate. 4. Follow up with GI as discussed. 5. Make an appointment to see me in about a month.  If we did any lab work today, and the results require attention, either me or my nurse will get in touch with you. If everything is normal, you will get a letter in mail and a message via . If you don't hear from [place] in two weeks, please give [place] a call. Otherwise, we look forward to seeing you again at your next visit. If you have any questions or concerns before then, please call the clinic at [phone].  Please bring all your medications to every doctors visit  Sign up for My Chart to have easy access to your labs results, and communication with your Primary care physician. Please ask Front Desk for some assistance.   Please check-out at the front desk before leaving the clinic.    Take Care,   Dr. Andy Gauss

## 2017-11-18 NOTE — Progress Notes (Signed)
Subjective: Mr. Schweigert presents the office today for concerns of thick, painful, elongated toenails that he cannot trim himself as well as for painful corns to the right fifth toe and to the outside aspects of both of his feet with the right worse than the left. He denies any increase in swelling to his feet he denies any drainage or any redness or warmth to his feet.  Denies any recent open sores.  He has been in the hospital for staph infection.  He admits that she did not speak during that.  He has no other concerns today. He denies any systemic complaints such as fevers, chills, nausea, vomiting. No calf pain, chest pain, shortness of breath.  Objective: AAO x3, NAD DP/PT pulses palpable bilaterally, CRT less than 3 seconds Protective sensation decreased with Simms Weinstein monofilament Hyperkeratotic lesion present right fifth toe as well as the fifth metatarsal base bilaterally the right side worse than left. Upon debridement no underlying ulceration, drainage or any signs of infection. Bilateral fourth and fifth digit toenails are dystrophic, discolored, hypertrophic and there is irritation in shoe gear. Upon debridement there is no drainage or pus. No swelling or redness from the toenail sites. There is no overlying edema, erythema, increase in warmth bilaterally. No open lesions or pre-ulcerative lesions.  No pain with calf compression, swelling, warmth, erythema  Assessment: Symptomatic onychomycosis 4, hyperkeratotic lesions 3  Plan: -All treatment options discussed with the patient including all alternatives, risks, complications.  -X-rays were obtained and reviewed. No evidence of acute fracture. Multiple digital deformities are present. Midfoot arthritic changes are present. -Nails were sharply debrided 4 without complications or bleeding -Hyperkeratotic lesions debrided 3 without complications or bleeding. -Daily foot inspection. -Follow-up in 3 months or sooner if needed.  Call any questions or concerns in the meantime.  Celesta Gentile, DPM

## 2017-11-18 NOTE — Assessment & Plan Note (Signed)
Patient has had excess skin folds secondary to weight loss after gastric banding.  Patient with complaint of increased moisture and malodorous smell concerning for possible fungal infection.  --Prescribed nystatin powder --Will refer to plastic surgery for possible excess skin removal

## 2017-11-18 NOTE — Assessment & Plan Note (Signed)
Patient has had to nodule developed on his flank and chest wall in the past few weeks.  They have been painful concerning for initial presentation for calciphylaxis.  Patient was previously on binders which have been discontinued since recent hospitalization.  Phosphorus and calcium within normal limits.  However given the nature of the nodules patient being ESRD patient will need to rule out calciphylaxis.  Patient was also transitioned from warfarin to Eliquis due to increased risk of calciphylaxis.   --Will refer patient to general surgery for biopsy. --We will follow-up on pathology results.

## 2017-11-18 NOTE — Progress Notes (Signed)
Subjective:    Patient ID: Adam Alm., male    DOB: 1960/08/16, 57 y.o.   MRN: [id number]   CC: Hospital follow-up  HPI: Patient is a 57 year old man with a complex past medical history who presents today to follow-up on recent hospital admission for right knee pain and swelling status post washout.  Since discharge patient reports that he has been doing well.  Patient saw orthopedic surgeon this morning and had sutures removed in his right knee.  Patient has declined IV antibiotics due to concern for diarrhea and recent prolonged course of IV antibiotics prior to this last hospital admission.  Patient also had a recent brachial vein transposition with left HeRo graft catheter placement. They have been using graft at dialysis and seems to be going well.  Patient has also discontinued Sulcrafate but continued to be on the Protonix.  Wife mentioned that patient is a little more forgetful in the past few weeks.  Is concerned this discussed well hospitalized.  Patient has also transitioned from warfarin to Eliquis. Smoking status reviewed   ROS: all other systems were reviewed and are negative other than in the HPI   Past Medical History:  Diagnosis Date  . AICD (automatic cardioverter/defibrillator) present    Medtronic   . Aortic stenosis    moderate AS by 08/2016 echo  . Arthritis    "knees" (08/05/2016)  . Atrial fibrillation ([place])   . ESRD (end stage renal disease) on dialysis ([place])    "M, T, W, T, F; NX stage hemodialysis; I do it at home" (08/05/2016)  . 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 History of blood transfusion 2009   "S/P biopsy for prostate cancer check"  . Hypertension   . Hypothyroidism (acquired)   . Mitral stenosis    moderate mitral stenosis  . Nonischemic cardiomyopathy ([place])   . Pneumonia 2014; 08/05/2016  . PONV (postoperative nausea and vomiting)   . Pulmonary HTN ([place])   . Renal insufficiency   .  Thyroid cancer ([place]) 2011    Past Surgical History:  Procedure Laterality Date  . ARTERIAL LINE INSERTION Right 08/24/2017   Procedure: ARTERIAL LINE INSERTION INTO RIGHT FEMORAL ARTERY;  Surgeon: Conrad [place], MD;  Location: [facility];  Service: Vascular;  Laterality: Right;  . BASCILIC VEIN TRANSPOSITION Right 10/13/2017   Procedure: FIRST STAGE BRACHIAL VEIN TRANSPOSITION RIGHT ARM;  Surgeon: Conrad [place], MD;  Location: [facility];  Service: Vascular;  Laterality: Right;  . BIOPSY  09/25/2017   Procedure: BIOPSY;  Surgeon: Ronnette Juniper, MD;  Location[facility] ENDOSCOPY;  Service: Gastroenterology;;  . CARDIAC CATHETERIZATION Right 2017  . CARDIAC DEFIBRILLATOR PLACEMENT  2009  . DIALYSIS FISTULA CREATION Left 2003   "upper arm"  . DIALYSIS/PERMA CATHETER REMOVAL Left 09/01/2017   Procedure: DIALYSIS/PERMA CATHETER REMOVAL;  Surgeon: Conrad Davidson, MD;  Location: [facility];  Service: Vascular;  Laterality: Left;  . ESOPHAGOGASTRODUODENOSCOPY (EGD) WITH PROPOFOL N/A 09/25/2017   Procedure: ESOPHAGOGASTRODUODENOSCOPY (EGD) WITH PROPOFOL;  Surgeon: Ronnette Juniper, MD;  Location: [place] [facility];  Service: Gastroenterology;  Laterality: N/A;  . HERNIA REPAIR    . I&D EXTREMITY Right 11/06/2017   Procedure: IRRIGATION AND DEBRIDEMENT KNEE;  Surgeon: Rod Can, MD;  Location: [facility];  Service: Orthopedics;  Laterality: Right;  . ICD LEAD REMOVAL Right 08/20/2017   Procedure: ICD EXTRACTION WITH C-ARM;  Surgeon: Evans Lance, MD;  Location: [facility];  Service: Cardiovascular;  Laterality: Right;  DR VAN TRIGT TO BACK-UP  . icd removed    . INSERTION OF DIALYSIS CATHETER N/A 11/11/2016   Procedure: INSERTION OF TUNNELED DIALYSIS CATHETER;  Surgeon: Angelia Mould, MD;  Location: [place] [facility];  Service: Vascular;  Laterality: N/A;  . INSERTION OF DIALYSIS CATHETER Left 08/24/2017   Procedure: INSERTION OF DIALYSIS CATHETER;  Surgeon: Conrad [place], MD;  Location: [facility];  Service: Vascular;  Laterality: Left;  . INSERTION  OF DIALYSIS CATHETER Left 09/01/2017   Procedure: INSERTION OF DIALYSIS CATHETER;  Surgeon: Conrad [place], MD;  Location: [facility];  Service: Vascular;  Laterality: Left;  . KIDNEY TRANSPLANT  2009  . KNEE SURGERY Bilateral 2000s   "drained fluid"  . LAPAROSCOPIC GASTRIC BANDING  2006  . LIGATION OF ARTERIOVENOUS  FISTULA Right 11/10/2017   Procedure: LIGATION OF BRACHIAL VEIN TRANSPOSITION RIGHT ARM;  Surgeon: Angelia Mould, MD;  Location: [facility];  Service: Vascular;  Laterality: Right;  . PROSTATE BIOPSY  2009  . REVISON OF ARTERIOVENOUS FISTULA Left 08/18/8117   Procedure: PLICATION OF ARTERIOVENOUS FISTULA ANEURYSM;  Surgeon: Elam Dutch, MD;  Location: [facility];  Service: Vascular;  Laterality: Left;  . REVISON OF ARTERIOVENOUS FISTULA Left 11/10/2016   Procedure: REVISON OF ARTERIOVENOUS FISTULA - LEFT UPPER ARM;  Surgeon: Elam Dutch, MD;  Location: [facility];  Service: Vascular;  Laterality: Left;  . REVISON OF ARTERIOVENOUS FISTULA Left 08/24/2017   Procedure: REVISON OF ARTERIOVENOUS FISTULA LEFT;  Surgeon: Conrad [place], MD;  Location: [facility];  Service: Vascular;  Laterality: Left;  . TEE WITHOUT CARDIOVERSION N/A 08/20/2017   Procedure: TRANSESOPHAGEAL ECHOCARDIOGRAM (TEE);  Surgeon: Evans Lance, MD;  Location: [facility];  Service: Cardiovascular;  Laterality: N/A;  . THROMBECTOMY W/ EMBOLECTOMY Left 12/25/2014   Procedure: THROMBECTOMY ARTERIOVENOUS FISTULA;  Surgeon: Elam Dutch, MD;  Location: [facility];  Service: Vascular;  Laterality: Left;  . THYROIDECTOMY  2011  . TOE AMPUTATION Bilateral     right 1st and  2nd digits; left 1st and 3rd digits"  . UMBILICAL HERNIA REPAIR  X 2  . UPPER EXTREMITY VENOGRAPHY Right 11/05/2017   Procedure: UPPER EXTREMITY VENOGRAPHY;  Surgeon: Conrad [place], MD;  Location: [facility] CV LAB;  Service: Cardiovascular;  Laterality: Right;  Marland Kitchen VASCULAR ACCESS DEVICE INSERTION Left 11/10/2017   Procedure: INSERTION OF SUPER HERO VASCULAR ACCESS DEVICE  LEFT ARM;  Surgeon: Angelia Mould, MD;  Location: [place];  Service: Vascular;  Laterality: Left;  Marland Kitchen VEIN HARVEST Left 08/24/2017   Procedure: VEIN HARVEST;  Surgeon: Conrad Hollis, MD;  Location: [facility] OR;  Service: Vascular;  Laterality: Left;    Past medical history, surgical, family, and social history reviewed and updated in the EMR as appropriate.  Objective:  BP 128/88   Pulse 94   Temp (!) 97.5 F (36.4 C) (Oral)   Ht 6\' 1"  (1.854 m)   Wt 273 lb (123.8 kg)   SpO2 99%   BMI 36.02 kg/m   Vitals and nursing note reviewed  General: NAD, pleasant, able to participate in exam Cardiac: RRR, normal heart sounds, no murmurs. 2+ radial and PT pulses bilaterally Respiratory: CTAB, normal effort, No wheezes, rales or rhonchi Abdomen: soft, nontender, nondistended, no hepatic or splenomegaly, +BS Extremities: Right with minimal swelling, no effusion.  Range of motion limited given recent procedure.  Incision site healing appropriately.  No drainage noted. Skin: warm and dry, no rashes noted Neuro: alert and oriented  x4, no focal deficits Psych: Normal affect and mood   Assessment & Plan:    Nodule of chest wall Patient has had to nodule developed on his flank and chest wall in the past few weeks.  They have been painful concerning for initial presentation for calciphylaxis.  Patient was previously on binders which have been discontinued since recent hospitalization.  Phosphorus and calcium within normal limits.  However given the nature of the nodules patient being ESRD patient will need to rule out calciphylaxis.  Patient was also transitioned from warfarin to Eliquis due to increased risk of calciphylaxis.   --Will refer patient to general surgery for biopsy. --We will follow-up on pathology results.  Excess skin Patient has had excess skin folds secondary to weight loss after gastric banding.  Patient with complaint of increased moisture and malodorous smell concerning for  possible fungal infection.  --Prescribed nystatin powder --Will refer to plastic surgery for possible excess skin removal  Right knee pain, improved Patient has been doing well since discharge from hospital.  Sutures have been removed by orthopedic surgeon this morning.  Patient has declined IV antibiotics due to diarrhea.  Cultures have been negative.  Initial concern for septic arthritis however lab findings are more consistent with inflammatory process.  Patient will continue to follow-up with orthopedic surgery.  Low threshold to restart IV antibiotics (ceftriaxone and vancomycin) with any signs of infection.  Concern for GI bleed Patient has an appointment with gastroenterology tomorrow for further evaluation of possible GI bleed as well as colonoscopy.  Patient has significant diarrhea for the past few weeks secondary to prolonged course of IV antibiotics.  Patient has denied ceftriaxone and vancomycin after right knee washout due to concern for continued diarrhea.  Patient has been tested for C. difficile twice with negative results.  Patient will discontinue Protonix next week.  Sulfate has been stopped.  Patient denies any abdominal pain or signs of reflux.  Decreased cognitive ability Likely secondary to multiple hospitalization in the past few months.  Patient has history of Alzheimer in his family.  We will continue to monitor, if concerning will need to [facility].  We will also check B12 level.  Minimal concern for onset dementia/Alzheimer.  [name], MD [facility] PGY-3

## 2017-11-19 ENCOUNTER — Ambulatory Visit: Payer: Medicare HMO | Admitting: Internal Medicine

## 2017-11-24 NOTE — Progress Notes (Signed)
    Postoperative Access Visit   History of Present Illness   Adam [name]. is a 57 y.o. year old male who presents for postoperative follow-up for: Ligation of R 1st BRVT, LUA HeRO graft-catheter by Dr. Scot Dock (Date: 11/10/17).  The patient's wounds are healed.  The patient notes no steal symptoms.  The patient is able to complete their activities of daily living.  The patient's current symptoms are: none.   Physical Examination   Vitals:   11/27/17 0853  BP: 100/64  Pulse: 78  Resp: 20  Temp: (!) 97.2 F (36.2 C)  TempSrc: Oral  SpO2: 94%  Weight: 276 lb 8 oz (125.4 kg)  Height: 6\' 1"  (1.854 m)    Right arm: Incision healed, stitches still in place left arm Incision is healed, skin feels warm, hand grip is 5/5, sensation in digits is intact, palpable thrill, bruit can be auscultated     Medical Decision Making   Adam [name]. is a 57 y.o. year old male who presents s/p ligation of R BRVT, L HeRO graft-catheter   HD is already using LUA AVG.  Patient can follow up with [place] as needed.  Thank you for allowing [place] to participate in this patient's care.   Adele Barthel, MD, FACS [facility] Office: [phone] Pager: [phone]

## 2017-11-25 ENCOUNTER — Encounter: Payer: Medicare HMO | Admitting: Vascular Surgery

## 2017-11-25 DIAGNOSIS — M1711 Unilateral primary osteoarthritis, right knee: Secondary | ICD-10-CM | POA: Diagnosis not present

## 2017-11-27 ENCOUNTER — Ambulatory Visit (INDEPENDENT_AMBULATORY_CARE_PROVIDER_SITE_OTHER): Payer: Medicare HMO | Admitting: Vascular Surgery

## 2017-11-27 ENCOUNTER — Other Ambulatory Visit: Payer: Self-pay

## 2017-11-27 ENCOUNTER — Encounter: Payer: Self-pay | Admitting: Vascular Surgery

## 2017-11-27 VITALS — BP 100/64 | HR 78 | Temp 97.2°F | Resp 20 | Ht 73.0 in | Wt 276.5 lb

## 2017-11-27 DIAGNOSIS — N186 End stage renal disease: Secondary | ICD-10-CM

## 2017-11-28 ENCOUNTER — Encounter: Payer: Self-pay | Admitting: Family Medicine

## 2017-12-01 DIAGNOSIS — M25561 Pain in right knee: Secondary | ICD-10-CM | POA: Diagnosis not present

## 2017-12-02 ENCOUNTER — Encounter: Payer: Self-pay | Admitting: Pulmonary Disease

## 2017-12-02 ENCOUNTER — Ambulatory Visit (INDEPENDENT_AMBULATORY_CARE_PROVIDER_SITE_OTHER): Payer: Medicare HMO | Admitting: Pulmonary Disease

## 2017-12-02 VITALS — BP 120/66 | HR 69 | Ht 73.0 in | Wt 277.0 lb

## 2017-12-02 DIAGNOSIS — R0602 Shortness of breath: Secondary | ICD-10-CM

## 2017-12-02 DIAGNOSIS — N186 End stage renal disease: Secondary | ICD-10-CM | POA: Diagnosis not present

## 2017-12-02 DIAGNOSIS — N189 Chronic kidney disease, unspecified: Secondary | ICD-10-CM

## 2017-12-02 DIAGNOSIS — D631 Anemia in chronic kidney disease: Secondary | ICD-10-CM | POA: Diagnosis not present

## 2017-12-02 DIAGNOSIS — I272 Pulmonary hypertension, unspecified: Secondary | ICD-10-CM

## 2017-12-02 NOTE — Patient Instructions (Signed)
Shortness of breath: Continue exercise with physical therapy and gradually increasing your cardiovascular exercise  Pulmonary hypertension: We will monitor this by checking another echocardiogram in October of this year Continue Letairis at current dosing Continue Adcirca at current dosing We will check a liver function test on the next visit  End-stage renal disease: Continue dialysis with dry weight at 124 kg as directed by the dialysis center  Follow-up in October 2019 or sooner if needed

## 2017-12-02 NOTE — Progress Notes (Signed)
Subjective:    Patient ID: Adam Alm., male    DOB: Sep 06, 1960, 57 y.o.   MRN: [id number]  Synopsis: Diagnosed with pulmonary hypertension in 2012. 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 Unfortunately his kidney transplant failed for uncertain reasons by 2683. Was diagnosed with pulmonary hypertension by 2012 in [place]. Was started on [place] in [place] and moved to [place] not long afterwards. Developed atrial fibrillation around 2009, had a defibrillator placed.  08/2015 RHC [facility]: Cardiac Index (l/min/m2) 3.7 L/min/m2  Right Atrium Mean Pressure (mmHg) 5 mmHg   Right Ventricle Systolic Pressure (mmHg) 46 mmHg   Pulmonary Artery Mean Pressure (mmHg) 31 mmHg   Pulmonary Wedge Pressure (mmHg) 11 mmHg   Pulmonary Vascular Resistance (Wood units) 2.2 Wood units   He had a second evaluation for renal transplant at [facility] in 2017 but was denied because of his hypertension and atrial fibrillation.  His dry weight is 124kg, his BP is best there.    He was hospitalized in early 2018 for healthcare associated pneumonia  HPI Chief Complaint  Patient presents with  . Follow-up    pt has been hospitalized several times Xfew mos for nonbreathing related issues.  Is currently in PT for knee sx.      Kiondre had a staph infection and he had to have his fistula, defbrillator replaced.  He also had R knee septic arthritis and needed to have surgery there.  He now has a new "Hero graft".  He actually had a cardiac arrest during this time.  He just started physical therapy for his R knee.  He feels better.  He is working on his mobility in his PT.   His hemoglobin has been low during this time as well and he had a GI bleed.  He says that he has struggled with fatigue and dypsnea when his hemoglobin was low.  He said that he wondered if this had anything to do with his pulmonary hypertension, but he has been feeling a little better  after transfusion.   He says that he still has some dyspnea.  He says that this has improved. His blood pressure has been doing OK at dialysis and his access graft has worked well.  He is doing three day a week dialysis.  He is still taking Adcirca and Letairis at the same doses he had been taking previously.  He still notes some dyspnea when he climbs a flight of stairs.  His dry weight now is 124 kg.  Past Medical History:  Diagnosis Date  . AICD (automatic cardioverter/defibrillator) present    Medtronic   . Aortic stenosis    moderate AS by 08/2016 echo  . Arthritis    "knees" (08/05/2016)  . Atrial fibrillation ([place])   . ESRD (end stage renal disease) on dialysis ([place])    "M, T, W, T, F; NX stage hemodialysis; I do it at home" (08/05/2016)  . Great toe amputation status ([place])    status post left hallux amputation 08/01/14  . History of blood transfusion 2009   "S/P biopsy for prostate cancer check"  . Hypertension   . Hypothyroidism (acquired)   . Mitral stenosis    moderate mitral stenosis  . Nonischemic cardiomyopathy ([place])   . Pneumonia 2014; 08/05/2016  . PONV (postoperative nausea and vomiting)   . Pulmonary HTN ([place])   . Renal insufficiency   . Thyroid cancer ([place]) 2011  Review of Systems  Constitutional: Negative for chills, fatigue and fever.  HENT: Negative for postnasal drip, rhinorrhea and sinus pressure.   Respiratory: Negative for cough, shortness of breath and wheezing.   Cardiovascular: Negative for chest pain, palpitations and leg swelling.       Objective:   Physical Exam Vitals:   12/02/17 0857  BP: 120/66  Pulse: 69  SpO2: 96%  Weight: 125.6 kg (277 lb)  Height: 6\' 1"  (1.854 m)   RA  Gen: well appearing HENT: OP clear, TM's clear, neck supple PULM: CTA B, normal percussion CV: RRR, systolic murmur, trace edema GI: BS+, soft, nontender Derm: no cyanosis or rash Psyche: normal mood and affect   CBC    Component Value Date/Time    WBC 6.2 11/11/2017 1307   RBC 2.99 (L) 11/11/2017 1307   HGB 8.3 (L) 11/11/2017 1307   HGB 8.0 (L) 09/16/2017 0906   HCT 26.9 (L) 11/11/2017 1307   HCT 25.9 (L) 09/16/2017 0906   PLT 139 (L) 11/11/2017 1307   PLT 128 (L) 09/16/2017 0906   MCV 90.0 11/11/2017 1307   MCV 88 09/16/2017 0906   MCH 27.8 11/11/2017 1307   MCHC 30.9 11/11/2017 1307   RDW 18.1 (H) 11/11/2017 1307   RDW 17.5 (H) 09/16/2017 0906   LYMPHSABS 0.3 (L) 11/03/2017 0031   MONOABS 0.5 11/03/2017 0031   EOSABS 0.0 11/03/2017 0031   BASOSABS 0.0 11/03/2017 0031     6MW March 2017 6 minute walk test at [facility] m, O2 saturation 100% on room air  Echo: March 2017 echocardiogram [facility] RVSP 40 mmHg, LVEF 55% March 2018 echo [facility]: RVSP 57mm Hg 08/2017 Echo RVSP 81mmHg  RHC April 2017 right heart catheterization [facility] cardiac index 3.7, right atrial pressure 5, RVSP 46, mean PA pressure 31, pulmonary capillary wedge pressure 11, pulmonary vascular resistance 2.2  Records from his hospitalization earlier this year reviewed where he had staph bacteremia, his ICD was removed, his fistula was resected    Assessment & Plan:   Pulmonary hypertension ([place])  Anemia due to chronic kidney disease, unspecified CKD stage  ESRD (end stage renal disease) ([place])  Shortness of breath  Discussion: Dwon is likely to be alive after always been through this year.  He looks great today.  He is experiencing some fatigue and dyspnea which I think is not unexpected considering his anemia and severe illness earlier this year.  Objectively his pulmonary hypertension actually looked a little better on the April 2019 echocardiogram.  So at this time I have no reason to change his pulmonary hypertension medicine but we do need to keep a close eye on this because he is feeling a bit more short of breath.  Plan: Shortness of breath: Continue exercise with physical therapy and gradually increasing your  cardiovascular exercise  Pulmonary hypertension: We will monitor this by checking another echocardiogram in October of this year Continue Letairis at current dosing Continue Adcirca at current dosing We will check a liver function test on the next visit  End-stage renal disease: Continue dialysis with dry weight at 124 kg as directed by the dialysis center  Follow-up in October 2019 or sooner if needed   Current Outpatient Medications:  .  ambrisentan (LETAIRIS) 10 MG tablet, Take 1 tablet (10 mg total) by mouth daily., Disp: 30 tablet, Rfl: 11 .  amiodarone (PACERONE) 200 MG tablet, Take 200 mg by mouth every evening. , Disp: , Rfl:  .  apixaban (  ELIQUIS) 2.5 MG TABS tablet, Take 1 tablet (2.5 mg total) by mouth 2 (two) times daily., Disp: 60 tablet, Rfl: 0 .  atorvastatin (LIPITOR) 40 MG tablet, Take 1 tablet (40 mg total) by mouth every evening., Disp: 30 tablet, Rfl: 2 .  cinacalcet (SENSIPAR) 30 MG tablet, Take 3 tablets (90 mg total) by mouth every evening., Disp: 30 tablet, Rfl: 0 .  levothyroxine (SYNTHROID, LEVOTHROID) 300 MCG tablet, Take 1 tablet (300 mcg total) by mouth daily before breakfast., Disp: 90 tablet, Rfl: 3 .  midodrine (PROAMATINE) 10 MG tablet, TAKE 1 TABLET (10 MG TOTAL) BY MOUTH 3 (THREE) TIMES DAILY., Disp: 90 tablet, Rfl: 5 .  multivitamin (RENA-VIT) TABS tablet, TAKE 1 TABLET BY MOUTH EVERYDAY AT BEDTIME, Disp: 30 tablet, Rfl: 0 .  nystatin (MYCOSTATIN/NYSTOP) powder, Apply topically 4 (four) times daily., Disp: 60 g, Rfl: 0 .  pantoprazole (PROTONIX) 40 MG tablet, Take 1 tablet (40 mg total) by mouth daily. For 14 days, then stop., Disp: 14 tablet, Rfl: 0 .  tadalafil (ADCIRCA/CIALIS) 20 MG tablet, Take 20 mg by mouth daily. , Disp: , Rfl:  .  traMADol (ULTRAM) 50 MG tablet, Take 1 tablet (50 mg total) by mouth every 6 (six) hours as needed for severe pain., Disp: 10 tablet, Rfl: 0

## 2017-12-03 ENCOUNTER — Ambulatory Visit (INDEPENDENT_AMBULATORY_CARE_PROVIDER_SITE_OTHER): Payer: Medicare HMO | Admitting: Family

## 2017-12-03 ENCOUNTER — Encounter: Payer: Self-pay | Admitting: Family

## 2017-12-03 ENCOUNTER — Encounter: Payer: Self-pay | Admitting: Physician Assistant

## 2017-12-03 ENCOUNTER — Inpatient Hospital Stay: Payer: Medicare HMO | Admitting: Infectious Disease

## 2017-12-03 ENCOUNTER — Ambulatory Visit (INDEPENDENT_AMBULATORY_CARE_PROVIDER_SITE_OTHER): Payer: Medicare HMO | Admitting: Physician Assistant

## 2017-12-03 VITALS — BP 100/66 | HR 94 | Temp 97.6°F | Ht 73.0 in | Wt 275.0 lb

## 2017-12-03 VITALS — BP 95/59 | HR 83 | Ht 73.0 in | Wt 275.0 lb

## 2017-12-03 DIAGNOSIS — I480 Paroxysmal atrial fibrillation: Secondary | ICD-10-CM

## 2017-12-03 DIAGNOSIS — I272 Pulmonary hypertension, unspecified: Secondary | ICD-10-CM

## 2017-12-03 DIAGNOSIS — T827XXD Infection and inflammatory reaction due to other cardiac and vascular devices, implants and grafts, subsequent encounter: Secondary | ICD-10-CM

## 2017-12-03 DIAGNOSIS — M009 Pyogenic arthritis, unspecified: Secondary | ICD-10-CM | POA: Diagnosis not present

## 2017-12-03 DIAGNOSIS — I428 Other cardiomyopathies: Secondary | ICD-10-CM | POA: Diagnosis not present

## 2017-12-03 NOTE — Assessment & Plan Note (Signed)
Mr. Reineck follows up for septic arthritis of the right knee which appears to be doing well. Unfortunately he was unable to complete his course of antibiotics secondary to side effects of gastrointestinal issues and diarrhea. He completed approximately 3-1/2 weeks worth of antibiotic therapy. He was initially recommended for 6 weeks of IV therapy. He has no signs of inflammation currently. Discussed possibility of oral medications, however we'll hold off at this time as he appears to be doing well. He has no systemic symptoms. Continue to monitor off antibiotics at this time. Follow-up if symptoms worsen or do not continue to improve.

## 2017-12-03 NOTE — Patient Instructions (Signed)
Medication Instructions:  Your physician recommends that you continue on your current medications as directed. Please refer to the Current Medication list given to you today.   Labwork: None ordered  Testing/Procedures: None ordered   Follow-Up: Your physician wants you to follow-up in: [place] will receive a reminder letter in the mail two months in advance. If you don't receive a letter, please call our office to schedule the follow-up appointment.   Any Other Special Instructions Will Be Listed Below (If Applicable).     If you need a refill on your cardiac medications before your next appointment, please call your pharmacy.

## 2017-12-03 NOTE — Progress Notes (Signed)
Cardiology Office Note   Date:  12/03/2017   ID:  Adam Alm., DOB 25-Dec-1960, MRN [id number]  PCP:  [name], MD  Cardiologist: Dr. Sallyanne Kuster, 09/21/2017 Adam Ferries, PA-C    History of Present Illness: Adam Gales. is a 57 y.o. male with a history of NICM, PAH (Dr Lake Bells), PAD, ESRD on HD, staph bacteremia s/p explantation MDT ICD and extraction AV fistula, PAF on coumadin and amio, thyroid CA>>thyroidectomy>>thyroid replacement rx  5/13 office visit, patient with LifeVest in place, EF now 64- 65%, no history of ICD discharges since moving to [place], rare NSVT (brief), on amio.   06/12 Dr. Lovena Le saw, do not reimplant ICD, continue amiodarone for PAF and brief NSVT, d/c LifeVest Admitted 06/24-07/08/2017 for septic arthritis R knee  Adam Alm. presents for cardiology follow up.  He is off ABX, sees ID this afternoon.   Does HD M-W-F, does not miss appts.   Never gets chest pain. Can walk about 1-1/2 blocks now, wants to increase back to previous levels of 3-4 blocks at a time.   DOE and fatigue limit his activity.  Was very anemic, Hgb 6.3 at one point, they are trying to build his counts back up. That makes him tired.   No palpitations, does not ever get presyncope or syncope.   ABX gave him GI problems, some bleeding, but that is improving.   He is doing PT for his knee.  No cardiac symptoms from that.  Systolic blood pressure 95 today, that is good for him.  He takes the midodrine 3 times a day and does not miss doses   Past Medical History:  Diagnosis Date  . AICD (automatic cardioverter/defibrillator) present    [employer]   . Aortic stenosis    moderate AS by 08/2016 echo  . Arthritis    "knees" (08/05/2016)  . Atrial fibrillation ([place])   . ESRD (end stage renal disease) on dialysis ([place])    "M, T, W, T, F; NX stage hemodialysis; I do it at home" (08/05/2016)  . Great toe amputation status ([place])    status post left hallux  amputation 08/01/14  . History of blood transfusion 2009   "S/P biopsy for prostate cancer check"  . Hypertension   . Hypothyroidism (acquired)   . Mitral stenosis    moderate mitral stenosis  . Nonischemic cardiomyopathy ([place])   . Pneumonia 2014; 08/05/2016  . PONV (postoperative nausea and vomiting)   . Pulmonary HTN ([place])   . Renal insufficiency   . Thyroid cancer ([place]) 2011    Past Surgical History:  Procedure Laterality Date  . ARTERIAL LINE INSERTION Right 08/24/2017   Procedure: ARTERIAL LINE INSERTION INTO RIGHT FEMORAL ARTERY;  Surgeon: Conrad Lake [place], MD;  Location: [facility];  Service: Vascular;  Laterality: Right;  . BASCILIC VEIN TRANSPOSITION Right 10/13/2017   Procedure: FIRST STAGE BRACHIAL VEIN TRANSPOSITION RIGHT ARM;  Surgeon: Conrad Walland, MD;  Location: [place];  Service: Vascular;  Laterality: Right;  . BIOPSY  09/25/2017   Procedure: BIOPSY;  Surgeon: Ronnette Juniper, MD;  Location: [facility] ENDOSCOPY;  Service: Gastroenterology;;  . CARDIAC CATHETERIZATION Right 2017  . CARDIAC DEFIBRILLATOR PLACEMENT  2009  . DIALYSIS FISTULA CREATION Left 2003   "upper arm"  . DIALYSIS/PERMA CATHETER REMOVAL Left 09/01/2017   Procedure: DIALYSIS/PERMA CATHETER REMOVAL;  Surgeon: Conrad [place], MD;  Location: [place];  Service: Vascular;  Laterality: Left;  . ESOPHAGOGASTRODUODENOSCOPY (EGD) WITH PROPOFOL N/A 09/25/2017  Procedure: ESOPHAGOGASTRODUODENOSCOPY (EGD) WITH PROPOFOL;  Surgeon: Ronnette Juniper, MD;  Location: [place];  Service: Gastroenterology;  Laterality: N/A;  . HERNIA REPAIR    . I&D EXTREMITY Right 11/06/2017   Procedure: IRRIGATION AND DEBRIDEMENT KNEE;  Surgeon: Rod Can, MD;  Location: [place];  Service: Orthopedics;  Laterality: Right;  . ICD LEAD REMOVAL Right 08/20/2017   Procedure: ICD EXTRACTION WITH C-ARM;  Surgeon: Evans Lance, MD;  Location: [facility] OR;  Service: Cardiovascular;  Laterality: Right;  DR VAN TRIGT TO BACK-UP  . icd removed    . INSERTION OF DIALYSIS  CATHETER N/A 11/11/2016   Procedure: INSERTION OF TUNNELED DIALYSIS CATHETER;  Surgeon: Angelia Mould, MD;  Location: [place];  Service: Vascular;  Laterality: N/A;  . INSERTION OF DIALYSIS CATHETER Left 08/24/2017   Procedure: INSERTION OF DIALYSIS CATHETER;  Surgeon: Conrad Winslow, MD;  Location: [place];  Service: Vascular;  Laterality: Left;  . INSERTION OF DIALYSIS CATHETER Left 09/01/2017   Procedure: INSERTION OF DIALYSIS CATHETER;  Surgeon: Conrad [place], MD;  Location: [place];  Service: Vascular;  Laterality: Left;  . KIDNEY TRANSPLANT  2009  . KNEE SURGERY Bilateral 2000s   "drained fluid"  . LAPAROSCOPIC GASTRIC BANDING  2006  . LIGATION OF ARTERIOVENOUS  FISTULA Right 11/10/2017   Procedure: LIGATION OF BRACHIAL VEIN TRANSPOSITION RIGHT ARM;  Surgeon: Angelia Mould, MD;  Location: [place];  Service: Vascular;  Laterality: Right;  . PROSTATE BIOPSY  2009  . REVISON OF ARTERIOVENOUS FISTULA Left 11/19/6267   Procedure: PLICATION OF ARTERIOVENOUS FISTULA ANEURYSM;  Surgeon: Elam Dutch, MD;  Location: [place];  Service: Vascular;  Laterality: Left;  . REVISON OF ARTERIOVENOUS FISTULA Left 11/10/2016   Procedure: REVISON OF ARTERIOVENOUS FISTULA - LEFT UPPER ARM;  Surgeon: Elam Dutch, MD;  Location: [place];  Service: Vascular;  Laterality: Left;  . REVISON OF ARTERIOVENOUS FISTULA Left 08/24/2017   Procedure: REVISON OF ARTERIOVENOUS FISTULA LEFT;  Surgeon: Conrad Riverside, MD;  Location: [place];  Service: Vascular;  Laterality: Left;  . TEE WITHOUT CARDIOVERSION N/A 08/20/2017   Procedure: TRANSESOPHAGEAL ECHOCARDIOGRAM (TEE);  Surgeon: Evans Lance, MD;  Location: [place];  Service: Cardiovascular;  Laterality: N/A;  . THROMBECTOMY W/ EMBOLECTOMY Left 12/25/2014   Procedure: THROMBECTOMY ARTERIOVENOUS FISTULA;  Surgeon: Elam Dutch, MD;  Location: [place];  Service: Vascular;  Laterality: Left;  . THYROIDECTOMY  2011  . TOE AMPUTATION Bilateral     right 1st and  2nd  digits; left 1st and 3rd digits"  . UMBILICAL HERNIA REPAIR  X 2  . UPPER EXTREMITY VENOGRAPHY Right 11/05/2017   Procedure: UPPER EXTREMITY VENOGRAPHY;  Surgeon: Conrad [place], MD;  Location: [place] CV LAB;  Service: Cardiovascular;  Laterality: Right;  Marland Kitchen VASCULAR ACCESS DEVICE INSERTION Left 11/10/2017   Procedure: INSERTION OF SUPER HERO VASCULAR ACCESS DEVICE LEFT ARM;  Surgeon: Angelia Mould, MD;  Location: [place];  Service: Vascular;  Laterality: Left;  Marland Kitchen VEIN HARVEST Left 08/24/2017   Procedure: VEIN HARVEST;  Surgeon: Conrad [place], MD;  Location[facility] OR;  Service: Vascular;  Laterality: Left;    Current Outpatient Medications  Medication Sig Dispense Refill  . ambrisentan (LETAIRIS) 10 MG tablet Take 1 tablet (10 mg total) by mouth daily. 30 tablet 11  . amiodarone (PACERONE) 200 MG tablet Take 200 mg by mouth every evening.     Marland Kitchen apixaban (ELIQUIS) 2.5 MG TABS tablet Take 1 tablet (2.5 mg  total) by mouth 2 (two) times daily. 60 tablet 0  . atorvastatin (LIPITOR) 40 MG tablet Take 1 tablet (40 mg total) by mouth every evening. 30 tablet 2  . cinacalcet (SENSIPAR) 30 MG tablet Take 3 tablets (90 mg total) by mouth every evening. 30 tablet 0  . levothyroxine (SYNTHROID, LEVOTHROID) 300 MCG tablet Take 1 tablet (300 mcg total) by mouth daily before breakfast. 90 tablet 3  . midodrine (PROAMATINE) 10 MG tablet TAKE 1 TABLET (10 MG TOTAL) BY MOUTH 3 (THREE) TIMES DAILY. 90 tablet 5  . multivitamin (RENA-VIT) TABS tablet TAKE 1 TABLET BY MOUTH EVERYDAY AT BEDTIME 30 tablet 0  . nystatin (MYCOSTATIN/NYSTOP) powder Apply topically 4 (four) times daily. 60 g 0  . pantoprazole (PROTONIX) 40 MG tablet Take 1 tablet (40 mg total) by mouth daily. For 14 days, then stop. 14 tablet 0  . tadalafil (ADCIRCA/CIALIS) 20 MG tablet Take 20 mg by mouth daily.     . traMADol (ULTRAM) 50 MG tablet Take 1 tablet (50 mg total) by mouth every 6 (six) hours as needed for severe pain. 10 tablet 0   No  current facility-administered medications for this visit.     Allergies:   Enalaprilat; Vasotec [enalapril]; and Iodinated diagnostic agents    Social History:  The patient  reports that he has never smoked. He has never used smokeless tobacco. He reports that he drinks alcohol. He reports that he does not use drugs.   Family History:  The patient's family history includes CAD (age of onset: 80) in his mother; Emphysema in his mother; Heart failure in his mother; Hypertension in his father and mother; Kidney failure in his father.    ROS:  Please see the history of present illness. All other systems are reviewed and negative.    PHYSICAL EXAM: VS:  BP (!) 95/59   Pulse 83   Ht 6\' 1"  (1.854 m)   Wt 275 lb (124.7 kg)   SpO2 99%   BMI 36.28 kg/m  , BMI Body mass index is 36.28 kg/m. GEN: Well nourished, well developed, male in no acute distress  HEENT: normal for age  Neck: no JVD, no carotid bruit, no masses Cardiac: RRR; 2/6 murmur, no rubs, or gallops Respiratory:  clear to auscultation bilaterally, normal work of breathing GI: soft, nontender, nondistended, + BS MS: no deformity or atrophy; no edema; distal pulses are 2+ in 3/4 extremities; LUE has current dialysis graft but still has a palpable pulse, RUE has previous dialysis graft and distal pulses are barely palpable but capillary refill is normal Skin: warm and dry, no rash, all incisions are well-healed Neuro:  Strength and sensation are intact Psych: euthymic mood, full affect   EKG:  EKG is ordered today. The ekg ordered today demonstrates sinus rhythm, heart rate 83, first-degree AV block, diffuse low voltage unchanged from previous ECG  ECHO: 08/18/2017 - Left ventricle: The cavity size was normal. Wall thickness was   increased in a pattern of mild LVH. Systolic function was normal.   The estimated ejection fraction was in the range of 60% to 65%.   Wall motion was normal; there were no regional wall motion    abnormalities. Doppler parameters are consistent with abnormal   left ventricular relaxation (grade 1 diastolic dysfunction). The   E/e&' ratio is between 8-15, suggesting indeterminate LV filling   pressure. - Ventricular septum: Septal motion showed abnormal function and   dyssynergy. The contour showed diastolic flattening and systolic  flattening. - Aortic valve: Moderate calcified aortic stenosis. Mean gradient   (S): 25 mm Hg. Peak gradient (S): 40 mm Hg. Valve area (VTI):   1.15 cm^2. Valve area (Vmax): 1.11 cm^2. Valve area (Vmean): 1.16   cm^2. - Mitral valve: Calcified annulus. Mildly thickened leaflets . Mild   to moderate stenosis. Mean gradient (D): 8 mm Hg. Valve area by   continuity equation (using LVOT flow): 1.25 cm^2. - Left atrium: The atrium was mildly dilated. - Right ventricle: The cavity size was moderately dilated. - Right atrium: Severely dilated. - Tricuspid valve: There was moderate regurgitation. - Pulmonary arteries: PA peak pressure: 59 mm Hg (S). - Inferior vena cava: The vessel was dilated. The respirophasic   diameter changes were blunted (< 50%), consistent with elevated   central venous pressure.  Impressions:  - Compared to a recent echo in 08/2016, the RVSP is slightly lower   at 59 mmHg. There is RV pressure and volume overload. There is   also moderate aortic stenosis with a mean gradient of 25 mmHg,   moderate RVE and severe RAE, the IVC is dilated. No major   valvular vegetations, however, endocarditis cannot be excluded on   the basis of this study.   Recent Labs: 08/26/2017: Magnesium 1.8 09/23/2017: ALT <5 11/04/2017: TSH 0.696 11/11/2017: BUN 27; Creatinine, Ser 7.30; Hemoglobin 8.3; Platelets 139; Potassium 3.4; Sodium 135    Lipid Panel    Component Value Date/Time   CHOL 149 03/24/2016 1616   TRIG 142 03/24/2016 1616   HDL 40 (L) 03/24/2016 1616   CHOLHDL 3.7 03/24/2016 1616   VLDL 28 03/24/2016 1616   LDLCALC 81 03/24/2016  1616     Wt Readings from Last 3 Encounters:  12/03/17 275 lb (124.7 kg)  12/02/17 277 lb (125.6 kg)  11/27/17 276 lb 8 oz (125.4 kg)     Other studies Reviewed: Additional studies/ records that were reviewed today include: Office notes, hospital records and testing.  ASSESSMENT AND PLAN:  1.  NICM: EF has been normal since 2015 echo. Volume management w/ HD  2. PAF: none since ICD gen change, last check was prior to explantation. No palpitations, no sx. Continue amio and Eliquis since pulm dz gives him high risk of recurrence  3. PAH: Continue tadalafil per Dr Lake Bells  4. 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 Both have been removed. Now off ABX. Decision has been made not to reimplant device  5. Mod AS: seen on echo 08/2017. Continue to follow, no sx attributable to this.  6. Chronic anticoagulation: switched from coumadin to Eliquis. Some gut issues from ABX and has seen some blood. Renal team is managing the anemia, it is improving.  7. Hypotension: chronic issue, on midodrine   Current medicines are reviewed at length with the patient today.  The patient does not have concerns regarding medicines.  The following changes have been made:  no change  Labs/ tests ordered today include:  No orders of the defined types were placed in this encounter.    Disposition:   FU with Dr. Sallyanne Kuster  Signed, Adam Ferries, PA-C  12/03/2017 8:21 AM    [place] Phone: [phone]; Fax: [phone]  This note was written with the assistance of speech recognition software. Please excuse any transcriptional errors.

## 2017-12-03 NOTE — Progress Notes (Signed)
Thank you [facility]

## 2017-12-03 NOTE — Progress Notes (Signed)
Subjective:    Patient ID: Adam Alm., male    DOB: Jan 04, 1961, 57 y.o.   MRN: [id number]  Chief Complaint  Patient presents with  . Septic Arthritis     HPI:  Adam [name]. is a 57 y.o. male who presents today for routine follow up of septic arthritis of the knee.   Adam [name] presented to the hospital with right knee pain and effusion going on for about 2 weeks in the setting of recent MSSA bacteremia infection of his fistula and defibrillator (which was removed) in early April. Aspiration in the ED with WBC count of 45,500, and 90% neutrophils with no crystals seen.  He was initially started on vancomycin. Dr. Delfino Lovett performed an I&D of the right knee on 6/28. Cultures obtained during the procedure were without growth. He was placed on Cefazolin and Vancomycin with hemodialysis for 6 weeks with end date of 12/18/17. All hospital records, labs and imaging were reviewed in detail.   Adam [name] has been doing well since leaving the hospital. He last saw Dr. Delfino Lovett on 11/18/17 and had the stiches removed and has since started physical therapy. He has soreness on occasion with some mild residual swelling. He has since stopped the antibiotics as he was having gastrointestinal bleeding and diarrhea. Which has improved since taking the medication. Last dose of antibiotics about 1 week ago completing about 3 weeks. He denies any fevers, chills, or sweats.   He also developed firm areas superior to the left hip, on his left chest and right sided flank. They have increased in size and are darker colored. General surgery is scheduled for biopsy/aspiration.    Allergies  Allergen Reactions  . Enalaprilat Swelling and Other (See Comments)    Mouth swelling  . Vasotec [Enalapril] Swelling and Other (See Comments)    MOUTH SWELLING  . Iodinated Diagnostic Agents Nausea And Vomiting and Nausea Only      Outpatient Medications Prior to Visit  Medication Sig Dispense Refill  .  ambrisentan (LETAIRIS) 10 MG tablet Take 1 tablet (10 mg total) by mouth daily. 30 tablet 11  . amiodarone (PACERONE) 200 MG tablet Take 200 mg by mouth every evening.     Marland Kitchen apixaban (ELIQUIS) 2.5 MG TABS tablet Take 1 tablet (2.5 mg total) by mouth 2 (two) times daily. 60 tablet 0  . atorvastatin (LIPITOR) 40 MG tablet Take 1 tablet (40 mg total) by mouth every evening. 30 tablet 2  . cinacalcet (SENSIPAR) 30 MG tablet Take 3 tablets (90 mg total) by mouth every evening. 30 tablet 0  . levothyroxine (SYNTHROID, LEVOTHROID) 300 MCG tablet Take 1 tablet (300 mcg total) by mouth daily before breakfast. 90 tablet 3  . midodrine (PROAMATINE) 10 MG tablet TAKE 1 TABLET (10 MG TOTAL) BY MOUTH 3 (THREE) TIMES DAILY. 90 tablet 5  . multivitamin (RENA-VIT) TABS tablet TAKE 1 TABLET BY MOUTH EVERYDAY AT BEDTIME 30 tablet 0  . nystatin (MYCOSTATIN/NYSTOP) powder Apply topically 4 (four) times daily. 60 g 0  . tadalafil (ADCIRCA/CIALIS) 20 MG tablet Take 20 mg by mouth daily.     . traMADol (ULTRAM) 50 MG tablet Take 1 tablet (50 mg total) by mouth every 6 (six) hours as needed for severe pain. 10 tablet 0  . pantoprazole (PROTONIX) 40 MG tablet Take 1 tablet (40 mg total) by mouth daily. For 14 days, then stop. (Patient not taking: Reported on 12/03/2017) 14 tablet 0   No facility-administered medications prior to  visit.      Past Medical History:  Diagnosis Date  . AICD (automatic cardioverter/defibrillator) present    Medtronic   . Aortic stenosis    moderate AS by 08/2016 echo  . Arthritis    "knees" (08/05/2016)  . Atrial fibrillation ([place])   . ESRD (end stage renal disease) on dialysis ([place])    "M, T, W, T, F; NX stage hemodialysis; I do it at home" (08/05/2016)  . Great toe amputation status ([place])    status post left hallux amputation 08/01/14  . History of blood transfusion 2009   "S/P biopsy for prostate cancer check"  . Hypertension   . Hypothyroidism (acquired)   . Mitral stenosis     moderate mitral stenosis  . Nonischemic cardiomyopathy ([place])   . Pneumonia 2014; 08/05/2016  . PONV (postoperative nausea and vomiting)   . Pulmonary HTN ([place])   . Renal insufficiency   . Thyroid cancer ([place]) 2011     Past Surgical History:  Procedure Laterality Date  . ARTERIAL LINE INSERTION Right 08/24/2017   Procedure: ARTERIAL LINE INSERTION INTO RIGHT FEMORAL ARTERY;  Surgeon: Conrad Shorewood Forest, MD;  Location: [place];  Service: Vascular;  Laterality: Right;  . BASCILIC VEIN TRANSPOSITION Right 10/13/2017   Procedure: FIRST STAGE BRACHIAL VEIN TRANSPOSITION RIGHT ARM;  Surgeon: Conrad Foster, MD;  Location: [place];  Service: Vascular;  Laterality: Right;  . BIOPSY  09/25/2017   Procedure: BIOPSY;  Surgeon: Ronnette Juniper, MD;  Location: [facility]COPY;  Service: Gastroenterology;;  . CARDIAC CATHETERIZATION Right 2017  . CARDIAC DEFIBRILLATOR PLACEMENT  2009  . DIALYSIS FISTULA CREATION Left 2003   "upper arm"  . DIALYSIS/PERMA CATHETER REMOVAL Left 09/01/2017   Procedure: DIALYSIS/PERMA CATHETER REMOVAL;  Surgeon: Conrad Lithonia, MD;  Location: [place];  Service: Vascular;  Laterality: Left;  . ESOPHAGOGASTRODUODENOSCOPY (EGD) WITH PROPOFOL N/A 09/25/2017   Procedure: ESOPHAGOGASTRODUODENOSCOPY (EGD) WITH PROPOFOL;  Surgeon: Ronnette Juniper, MD;  Location: [place];  Service: Gastroenterology;  Laterality: N/A;  . HERNIA REPAIR    . I&D EXTREMITY Right 11/06/2017   Procedure: IRRIGATION AND DEBRIDEMENT KNEE;  Surgeon: Rod Can, MD;  Location: [place];  Service: Orthopedics;  Laterality: Right;  . ICD LEAD REMOVAL Right 08/20/2017   Procedure: ICD EXTRACTION WITH C-ARM;  Surgeon: Evans Lance, MD;  Location: [facility] OR;  Service: Cardiovascular;  Laterality: Right;  DR VAN TRIGT TO [name]-UP  . icd removed    . INSERTION OF DIALYSIS CATHETER N/A 11/11/2016   Procedure: INSERTION OF TUNNELED DIALYSIS CATHETER;  Surgeon: Angelia Mould, MD;  Location: [place];  Service: Vascular;  Laterality: N/A;   . INSERTION OF DIALYSIS CATHETER Left 08/24/2017   Procedure: INSERTION OF DIALYSIS CATHETER;  Surgeon: Conrad Wauzeka, MD;  Location: [place];  Service: Vascular;  Laterality: Left;  . INSERTION OF DIALYSIS CATHETER Left 09/01/2017   Procedure: INSERTION OF DIALYSIS CATHETER;  Surgeon: Conrad Manassa, MD;  Location: [place];  Service: Vascular;  Laterality: Left;  . KIDNEY TRANSPLANT  2009  . KNEE SURGERY Bilateral 2000s   "drained fluid"  . LAPAROSCOPIC GASTRIC BANDING  2006  . LIGATION OF ARTERIOVENOUS  FISTULA Right 11/10/2017   Procedure: LIGATION OF BRACHIAL VEIN TRANSPOSITION RIGHT ARM;  Surgeon: Angelia Mould, MD;  Location: [place];  Service: Vascular;  Laterality: Right;  . PROSTATE BIOPSY  2009  . REVISON OF ARTERIOVENOUS FISTULA Left 1/61/0960   Procedure: PLICATION OF ARTERIOVENOUS FISTULA ANEURYSM;  Surgeon: Elam Dutch,  MD;  Location: [place];  Service: Vascular;  Laterality: Left;  . REVISON OF ARTERIOVENOUS FISTULA Left 11/10/2016   Procedure: REVISON OF ARTERIOVENOUS FISTULA - LEFT UPPER ARM;  Surgeon: Elam Dutch, MD;  Location: [place];  Service: Vascular;  Laterality: Left;  . REVISON OF ARTERIOVENOUS FISTULA Left 08/24/2017   Procedure: REVISON OF ARTERIOVENOUS FISTULA LEFT;  Surgeon: Conrad Lynbrook, MD;  Location: [place];  Service: Vascular;  Laterality: Left;  . TEE WITHOUT CARDIOVERSION N/A 08/20/2017   Procedure: TRANSESOPHAGEAL ECHOCARDIOGRAM (TEE);  Surgeon: Evans Lance, MD;  Location: [place];  Service: Cardiovascular;  Laterality: N/A;  . THROMBECTOMY W/ EMBOLECTOMY Left 12/25/2014   Procedure: THROMBECTOMY ARTERIOVENOUS FISTULA;  Surgeon: Elam Dutch, MD;  Location: [place];  Service: Vascular;  Laterality: Left;  . THYROIDECTOMY  2011  . TOE AMPUTATION Bilateral     right 1st and  2nd digits; left 1st and 3rd digits"  . UMBILICAL HERNIA REPAIR  X 2  . UPPER EXTREMITY VENOGRAPHY Right 11/05/2017   Procedure: UPPER EXTREMITY VENOGRAPHY;  Surgeon: Conrad Danville, MD;  Location: [place] CV LAB;  Service: Cardiovascular;  Laterality: Right;  Marland Kitchen VASCULAR ACCESS DEVICE INSERTION Left 11/10/2017   Procedure: INSERTION OF SUPER HERO VASCULAR ACCESS DEVICE LEFT ARM;  Surgeon: Angelia Mould, MD;  Location: [place];  Service: Vascular;  Laterality: Left;  Marland Kitchen VEIN HARVEST Left 08/24/2017   Procedure: VEIN HARVEST;  Surgeon: Conrad Waukon, MD;  Location: [facility] OR;  Service: Vascular;  Laterality: Left;       Review of Systems  Constitutional: Negative for activity change, chills, fatigue and fever.  Respiratory: Negative for cough, chest tightness and shortness of breath.   Cardiovascular: Negative for chest pain.  Gastrointestinal: Negative for abdominal distention, constipation, diarrhea, nausea and rectal pain.      Objective:    BP 100/66   Pulse 94   Temp 97.6 F (36.4 C)   Ht 6\' 1"  (1.854 m)   Wt 275 lb (124.7 kg)   BMI 36.28 kg/m  Nursing note and vital signs reviewed.  Physical Exam  Constitutional: He is oriented to person, place, and time. He appears well-developed and well-nourished. No distress.  Obese; seated in the chair; pleasant  Cardiovascular: Normal rate, regular rhythm, normal heart sounds and intact distal pulses. Exam reveals no gallop and no friction rub.  No murmur heard. Pulmonary/Chest: Effort normal and breath sounds normal. No respiratory distress. He has no wheezes. He has no rales. He exhibits no tenderness.  Musculoskeletal:  Right knee - surgical scars appear healing and well approximated with no obvious signs of infection. There is mild edema with no discoloration or deformity. Distal pulses and sensation are intact and appropriate.  Neurological: He is alert and oriented to person, place, and time.  Skin: Skin is warm and dry.  Psychiatric: He has a normal mood and affect. His behavior is normal. Judgment and thought content normal.       Assessment & Plan:   Problem List Items Addressed This Visit       Musculoskeletal and Integument   Septic arthritis of knee, right ([place]) - Primary    Adam [name] follows up for septic arthritis of the right knee which appears to be doing well. Unfortunately he was unable to complete his course of antibiotics secondary to side effects of gastrointestinal issues and diarrhea. He completed approximately 3-1/2 weeks worth of antibiotic therapy. He was initially recommended for 6 weeks of  IV therapy. He has no signs of inflammation currently. Discussed possibility of oral medications, however we'll hold off at this time as he appears to be doing well. He has no systemic symptoms. Continue to monitor off antibiotics at this time. Follow-up if symptoms worsen or do not continue to improve.          I am having Laurence Slate. maintain his amiodarone, atorvastatin, ambrisentan, levothyroxine, cinacalcet, midodrine, tadalafil, multivitamin, pantoprazole, apixaban, traMADol, and nystatin.   Follow-up: Return if symptoms worsen or fail to improve.   Terri Piedra, MSN, [facility]

## 2017-12-03 NOTE — Patient Instructions (Signed)
Nice to see you.  We will continue to monitor off antibiotics for now.  If symptoms develop we will pursue further workup.  No follow up is needed at this time.

## 2017-12-05 ENCOUNTER — Encounter: Payer: Self-pay | Admitting: Vascular Surgery

## 2017-12-06 LAB — FUNGUS CULTURE WITH STAIN

## 2017-12-06 LAB — FUNGUS CULTURE RESULT

## 2017-12-06 LAB — FUNGAL ORGANISM REFLEX

## 2017-12-07 LAB — FUNGUS CULTURE WITH STAIN

## 2017-12-07 LAB — FUNGAL ORGANISM REFLEX

## 2017-12-07 LAB — FUNGUS CULTURE RESULT

## 2017-12-08 DIAGNOSIS — M25561 Pain in right knee: Secondary | ICD-10-CM | POA: Diagnosis not present

## 2017-12-09 DIAGNOSIS — E1129 Type 2 diabetes mellitus with other diabetic kidney complication: Secondary | ICD-10-CM | POA: Diagnosis not present

## 2017-12-09 DIAGNOSIS — Z992 Dependence on renal dialysis: Secondary | ICD-10-CM | POA: Diagnosis not present

## 2017-12-09 DIAGNOSIS — N186 End stage renal disease: Secondary | ICD-10-CM | POA: Diagnosis not present

## 2017-12-10 ENCOUNTER — Other Ambulatory Visit: Payer: Self-pay | Admitting: Family Medicine

## 2017-12-10 DIAGNOSIS — M25561 Pain in right knee: Secondary | ICD-10-CM | POA: Diagnosis not present

## 2017-12-11 ENCOUNTER — Ambulatory Visit: Payer: Self-pay | Admitting: General Surgery

## 2017-12-11 ENCOUNTER — Other Ambulatory Visit: Payer: Self-pay | Admitting: *Deleted

## 2017-12-11 ENCOUNTER — Other Ambulatory Visit: Payer: Self-pay | Admitting: Family Medicine

## 2017-12-11 ENCOUNTER — Telehealth: Payer: Self-pay

## 2017-12-11 DIAGNOSIS — L729 Follicular cyst of the skin and subcutaneous tissue, unspecified: Secondary | ICD-10-CM | POA: Diagnosis not present

## 2017-12-11 DIAGNOSIS — M25461 Effusion, right knee: Secondary | ICD-10-CM | POA: Diagnosis not present

## 2017-12-11 DIAGNOSIS — N2581 Secondary hyperparathyroidism of renal origin: Secondary | ICD-10-CM | POA: Diagnosis not present

## 2017-12-11 DIAGNOSIS — N186 End stage renal disease: Secondary | ICD-10-CM | POA: Diagnosis not present

## 2017-12-11 DIAGNOSIS — D509 Iron deficiency anemia, unspecified: Secondary | ICD-10-CM | POA: Diagnosis not present

## 2017-12-11 DIAGNOSIS — D631 Anemia in chronic kidney disease: Secondary | ICD-10-CM | POA: Diagnosis not present

## 2017-12-11 MED ORDER — ATORVASTATIN CALCIUM 40 MG PO TABS: 40.0000 mg | ORAL_TABLET | Freq: Every evening | ORAL | 2 refills | Status: AC

## 2017-12-11 NOTE — Telephone Encounter (Signed)
   Primary Cardiologist:Mihai Croitoru, MD  Chart reviewed as part of pre-operative protocol coverage.  Patient just seen a few days ago by Rosaria Ferries PA-C - given his complex PMH, will route to her for input on clearance for general anesthesia.   Will also route to pharm for input on Eliquis. Appreciate their input on assessing whether pt is on correct dose as well - he is on 2.5mg  BID. UpToDate states: - "ESRD requiring hemodialysis: Not dialyzable to minimally dialyzable (AUC decreased by 14% over 4 hours) (NCS/SCCM [[place] 2016]; Wang 2016). According to the manufacturer, no dosage adjustment necessary unless either ?55 years of age or body weight ?60 kg, then reduce to 2.5 mg twice daily. These manufacturer recommendations are based solely on a single-dose pharmacokinetic and pharmacodynamic (anti-factor Xa activity) study in only 8 patients Mina Marble 2016). Pharmacokinetic data suggest use of 2.5 mg twice daily for patients with ESRD requiring thrice weekly hemodialysis since the approved 5 mg twice-daily dosing resulted in significant accumulation over 8 days of administration, resulting in supratherapeutic exposure ([place] 2017). Despite these pharmacokinetic studies, a retrospective propensity-matched cohort study of patients with ESRD requiring hemodialysis found that apixaban 5 mg twice daily resulted in fewer thromboembolic events and fewer major bleeding events while apixaban 2.5 mg twice daily only resulted in fewer major bleeding events compared to warfarin (S[place]tis 2018)."  Appreciate input on what office protocol is for this cohort of patients on ESRD.  Charlie Pitter, PA-C 12/11/2017, 3:26 PM

## 2017-12-11 NOTE — Telephone Encounter (Signed)
   [facility] Pre-operative Risk Assessment    Request for surgical clearance:  1. What type of surgery is being performed? Exc. Cyst on chest area   2. When is this surgery scheduled? TBD   3. What type of clearance is required (medical clearance vs. Pharmacy clearance to hold med vs. Both)? Both  4. Are there any medications that need to be held prior to surgery and how long? Eliquis   5. Practice name and name of physician performing surgery? [facility] -DR.Ramirez  6. What is your office phone number [phone]   7.   What is your office fax number [phone]  8.   Anesthesia type (None, local, MAC, general) ? General    Adam [name] 12/11/2017, 1:15 PM  _________________________________________________________________   (provider comments below)

## 2017-12-12 ENCOUNTER — Encounter: Payer: Self-pay | Admitting: Vascular Surgery

## 2017-12-12 ENCOUNTER — Emergency Department (HOSPITAL_COMMUNITY)
Admission: EM | Admit: 2017-12-12 | Discharge: 2017-12-12 | Disposition: A | Discharge status: Home / Self Care | Payer: Medicare HMO | Attending: Emergency Medicine | Admitting: Emergency Medicine

## 2017-12-12 ENCOUNTER — Encounter (HOSPITAL_COMMUNITY): Payer: Self-pay | Admitting: Emergency Medicine

## 2017-12-12 DIAGNOSIS — Z85850 Personal history of malignant neoplasm of thyroid: Secondary | ICD-10-CM | POA: Insufficient documentation

## 2017-12-12 DIAGNOSIS — I959 Hypotension, unspecified: Secondary | ICD-10-CM | POA: Diagnosis not present

## 2017-12-12 DIAGNOSIS — Z5189 Encounter for other specified aftercare: Secondary | ICD-10-CM

## 2017-12-12 DIAGNOSIS — I120 Hypertensive chronic kidney disease with stage 5 chronic kidney disease or end stage renal disease: Secondary | ICD-10-CM | POA: Diagnosis not present

## 2017-12-12 DIAGNOSIS — Z9884 Bariatric surgery status: Secondary | ICD-10-CM | POA: Diagnosis not present

## 2017-12-12 DIAGNOSIS — Z7901 Long term (current) use of anticoagulants: Secondary | ICD-10-CM | POA: Insufficient documentation

## 2017-12-12 DIAGNOSIS — E039 Hypothyroidism, unspecified: Secondary | ICD-10-CM | POA: Insufficient documentation

## 2017-12-12 DIAGNOSIS — R1032 Left lower quadrant pain: Secondary | ICD-10-CM | POA: Insufficient documentation

## 2017-12-12 DIAGNOSIS — Z79899 Other long term (current) drug therapy: Secondary | ICD-10-CM | POA: Diagnosis not present

## 2017-12-12 DIAGNOSIS — N186 End stage renal disease: Secondary | ICD-10-CM | POA: Insufficient documentation

## 2017-12-12 DIAGNOSIS — Z992 Dependence on renal dialysis: Secondary | ICD-10-CM | POA: Insufficient documentation

## 2017-12-12 DIAGNOSIS — L7622 Postprocedural hemorrhage and hematoma of skin and subcutaneous tissue following other procedure: Secondary | ICD-10-CM | POA: Diagnosis present

## 2017-12-12 LAB — CBC WITH DIFFERENTIAL/PLATELET
Abs Immature Granulocytes: 0 10*3/uL (ref 0.0–0.1)
BASOS PCT: 1 %
Basophils Absolute: 0 10*3/uL (ref 0.0–0.1)
EOS ABS: 0.3 10*3/uL (ref 0.0–0.7)
Eosinophils Relative: 10 %
HEMATOCRIT: 28.4 % — AB (ref 39.0–52.0)
Hemoglobin: 8.4 g/dL — ABNORMAL LOW (ref 13.0–17.0)
IMMATURE GRANULOCYTES: 1 %
LYMPHS ABS: 0.4 10*3/uL — AB (ref 0.7–4.0)
Lymphocytes Relative: 12 %
MCH: 27.3 pg (ref 26.0–34.0)
MCHC: 29.6 g/dL — ABNORMAL LOW (ref 30.0–36.0)
MCV: 92.2 fL (ref 78.0–100.0)
MONOS PCT: 11 %
Monocytes Absolute: 0.4 10*3/uL (ref 0.1–1.0)
Neutro Abs: 2.1 10*3/uL (ref 1.7–7.7)
Neutrophils Relative %: 65 %
PLATELETS: 98 10*3/uL — AB (ref 150–400)
RBC: 3.08 MIL/uL — ABNORMAL LOW (ref 4.22–5.81)
RDW: 19.7 % — AB (ref 11.5–15.5)
WBC: 3.3 10*3/uL — ABNORMAL LOW (ref 4.0–10.5)

## 2017-12-12 LAB — COMPREHENSIVE METABOLIC PANEL
ALT: 10 U/L (ref 0–44)
AST: 35 U/L (ref 15–41)
Albumin: 3.1 g/dL — ABNORMAL LOW (ref 3.5–5.0)
Alkaline Phosphatase: 151 U/L — ABNORMAL HIGH (ref 38–126)
Anion gap: 10 (ref 5–15)
BILIRUBIN TOTAL: 1 mg/dL (ref 0.3–1.2)
BUN: 12 mg/dL (ref 6–20)
CHLORIDE: 98 mmol/L (ref 98–111)
CO2: 28 mmol/L (ref 22–32)
CREATININE: 5.23 mg/dL — AB (ref 0.61–1.24)
Calcium: 8.4 mg/dL — ABNORMAL LOW (ref 8.9–10.3)
GFR calc non Af Amer: 11 mL/min — ABNORMAL LOW (ref >60)
GFR, EST AFRICAN AMERICAN: 13 mL/min — AB (ref >60)
Glucose, Bld: 76 mg/dL (ref 70–99)
Potassium: 4.7 mmol/L (ref 3.5–5.1)
Sodium: 136 mmol/L (ref 135–145)
Total Protein: 6.2 g/dL — ABNORMAL LOW (ref 6.5–8.1)

## 2017-12-12 LAB — PROTIME-INR
INR: 1.29
PROTHROMBIN TIME: 16 s — AB (ref 11.4–15.2)

## 2017-12-12 MED ORDER — MIDODRINE HCL 5 MG PO TABS
10.0000 mg | ORAL_TABLET | Freq: Once | ORAL | Status: AC
  Administered 2017-12-12: 10 mg via ORAL
  Filled 2017-12-12: qty 2

## 2017-12-12 NOTE — ED Notes (Signed)
No bleeding at present 

## 2017-12-12 NOTE — ED Notes (Signed)
72/40 manual right arm-- provider made aware. Pt does take Mididrine for low BP, but normal BP is in high 99'Q- 00'X systolic

## 2017-12-12 NOTE — ED Triage Notes (Signed)
Pt presents to ED for assessment of bleeding from the site on his left thigh/groin where his hemodialysis catheter was pulled over 1 month ago.  Patient denies any injury to the area.  States he was washing his hands at [facility] bathroom, felt something running down his leg, and when he went to lower his pants he noted squirting blood from the removal site.  Bleeding is oozing at this point, holding pressure in triage.

## 2017-12-12 NOTE — ED Provider Notes (Signed)
[facility] EMERGENCY DEPARTMENT Provider Note  CSN: [id number] Arrival date & time: 12/12/17  1650  History   Chief Complaint Chief Complaint  Patient presents with  . Post-op Problem    HPI Adam [name]. is a 57 y.o. male with a medical history of ESRD, a-fib, HTN and hypothyroidism who presented to the ED for bleeding from a previous catheter site. Patient states that he had his hemodialysis catheter removed from his left groin 1 month ago and healing has been going normal without issues. Today he states that the site began bleeding spontaneously. Bleeding controlled prior to arrival. Endorses soreness to touch around the catheter site. Denies paresthesias, weakness, color or temperature change in the extremity.  Additional history obtained by medical chart. Patient seen by vascular surgeon on  11/27/17 for post-op follow-up. Patient states that the surgeon stated that area was healing well.  Past Medical History:  Diagnosis Date  . AICD (automatic cardioverter/defibrillator) present    [employer]   . Aortic stenosis    moderate AS by 08/2016 echo  . Arthritis    "knees" (08/05/2016)  . Atrial fibrillation ([place])   . ESRD (end stage renal disease) on dialysis ([place])    "M, T, W, T, F; NX stage hemodialysis; I do it at home" (08/05/2016)  . Great toe amputation status ([place])    status post left hallux amputation 08/01/14  . History of blood transfusion 2009   "S/P biopsy for prostate cancer check"  . Hypertension   . Hypothyroidism (acquired)   . Mitral stenosis    moderate mitral stenosis  . Nonischemic cardiomyopathy ([place])   . Pneumonia 2014; 08/05/2016  . PONV (postoperative nausea and vomiting)   . Pulmonary HTN ([place])   . Renal insufficiency   . Thyroid cancer [facility]) 2011    Patient Active Problem List   Diagnosis Date Noted  . Nodule of chest wall 11/18/2017  . Calciphylaxis   . Abscess   . Acute pain of right knee   . Malnutrition of moderate  degree 11/04/2017  . Septic arthritis of knee, right ([place]) 11/02/2017  . Dialysis AV fistula infection, sequela 11/02/2017  . Mass 10/29/2017  . Near syncope 09/24/2017  . Syncope, cardiogenic   . ESRD on hemodialysis ([place])   . Anemia 09/16/2017  . Diarrhea 09/16/2017  . Tarry stools 09/16/2017  . Acute encephalopathy   . Hypotension   . Complication of arteriovenous dialysis fistula   . Stage 5 chronic kidney disease on chronic dialysis ([employer] [facility] [employer])   . Infected defibrillator ([place])   . MSSA bacteremia 08/18/2017  . Sepsis ([place]) 08/17/2017  . Lymphadenopathy, mediastinal 04/10/2017  . Hypothyroidism 10/09/2016  . Acute respiratory failure with hypoxia ([place]) 08/06/2016  . HCAP (healthcare-associated pneumonia) 08/05/2016  . Thyroid cancer ([place]) 06/12/2016  . Epistaxis 03/04/2016  . Long term current use of amiodarone 01/10/2016  . Critical lower limb ischemia 12/04/2015  . Carotid artery stenosis 05/24/2015  . Excess skin 05/24/2015  . 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 Status post amputation 04/19/2015  . Wound dehiscence 04/19/2015  . Sinus congestion 03/08/2015  . Bleeding pseudoaneurysm of left brachiocephalic arteriovenous fistula ([place]) 12/24/2014  . Pure hypercholesterolemia 11/09/2014  . Obesity 11/07/2014  . Healthcare maintenance 11/07/2014  . H/O malignant neoplasm of thyroid 11/07/2014  . Toe amputation status ([place]) 10/13/2014  . ICD (implantable cardioverter-defibrillator) in place 07/25/2014  . ESRD (end stage renal disease) on hemodialysis 07/25/2014  . End-stage renal disease ([place]) 07/25/2014  .  Automatic implantable cardioverter-defibrillator in situ 07/25/2014  . Pulmonary hypertension ([place]) 2020-07-2414  . Secondary pulmonary hypertension 2020-07-2414  . Atrial fibrillation [I48.91] 05/24/2014  . Long term current use of anticoagulant therapy 05/24/2014    Past Surgical History:  Procedure Laterality Date  . ARTERIAL LINE INSERTION Right 08/24/2017    Procedure: ARTERIAL LINE INSERTION INTO RIGHT FEMORAL ARTERY;  Surgeon: Conrad Dearborn, MD;  Location: [facility];  Service: Vascular;  Laterality: Right;  . BASCILIC VEIN TRANSPOSITION Right 10/13/2017   Procedure: FIRST STAGE BRACHIAL VEIN TRANSPOSITION RIGHT ARM;  Surgeon: Conrad Whitinsville, MD;  Location: [place];  Service: Vascular;  Laterality: Right;  . BIOPSY  09/25/2017   Procedure: BIOPSY;  Surgeon: Ronnette Juniper, MD;  Location: [facility] ENDOSCOPY;  Service: Gastroenterology;;  . CARDIAC CATHETERIZATION Right 2017  . CARDIAC DEFIBRILLATOR PLACEMENT  2009  . DIALYSIS FISTULA CREATION Left 2003   "upper arm"  . DIALYSIS/PERMA CATHETER REMOVAL Left 09/01/2017   Procedure: DIALYSIS/PERMA CATHETER REMOVAL;  Surgeon: Conrad Sabinal, MD;  Location: [place];  Service: Vascular;  Laterality: Left;  . ESOPHAGOGASTRODUODENOSCOPY (EGD) WITH PROPOFOL N/A 09/25/2017   Procedure: ESOPHAGOGASTRODUODENOSCOPY (EGD) WITH PROPOFOL;  Surgeon: Ronnette Juniper, MD;  Location: [place];  Service: Gastroenterology;  Laterality: N/A;  . HERNIA REPAIR    . I&D EXTREMITY Right 11/06/2017   Procedure: IRRIGATION AND DEBRIDEMENT KNEE;  Surgeon: Rod Can, MD;  Location: [place];  Service: Orthopedics;  Laterality: Right;  . ICD LEAD REMOVAL Right 08/20/2017   Procedure: ICD EXTRACTION WITH C-ARM;  Surgeon: Evans Lance, MD;  Location: [facility] OR;  Service: Cardiovascular;  Laterality: Right;  DR VAN TRIGT TO BACK-UP  . icd removed    . INSERTION OF DIALYSIS CATHETER N/A 11/11/2016   Procedure: INSERTION OF TUNNELED DIALYSIS CATHETER;  Surgeon: Angelia Mould, MD;  Location: [place] [facility];  Service: Vascular;  Laterality: N/A;  . INSERTION OF DIALYSIS CATHETER Left 08/24/2017   Procedure: INSERTION OF DIALYSIS CATHETER;  Surgeon: Conrad Hauser, MD;  Location: [place];  Service: Vascular;  Laterality: Left;  . INSERTION OF DIALYSIS CATHETER Left 09/01/2017   Procedure: INSERTION OF DIALYSIS CATHETER;  Surgeon: Conrad Avondale, MD;  Location: [place];   Service: Vascular;  Laterality: Left;  . KIDNEY TRANSPLANT  2009  . KNEE SURGERY Bilateral 2000s   "drained fluid"  . LAPAROSCOPIC GASTRIC BANDING  2006  . LIGATION OF ARTERIOVENOUS  FISTULA Right 11/10/2017   Procedure: LIGATION OF BRACHIAL VEIN TRANSPOSITION RIGHT ARM;  Surgeon: Angelia Mould, MD;  Location: [facility];  Service: Vascular;  Laterality: Right;  . PROSTATE BIOPSY  2009  . REVISON OF ARTERIOVENOUS FISTULA Left 05/20/3233   Procedure: PLICATION OF ARTERIOVENOUS FISTULA ANEURYSM;  Surgeon: Elam Dutch, MD;  Location: [place];  Service: Vascular;  Laterality: Left;  . REVISON OF ARTERIOVENOUS FISTULA Left 11/10/2016   Procedure: REVISON OF ARTERIOVENOUS FISTULA - LEFT UPPER ARM;  Surgeon: Elam Dutch, MD;  Location: [facility];  Service: Vascular;  Laterality: Left;  . REVISON OF ARTERIOVENOUS FISTULA Left 08/24/2017   Procedure: REVISON OF ARTERIOVENOUS FISTULA LEFT;  Surgeon: Conrad , MD;  Location: [place] [facility];  Service: Vascular;  Laterality: Left;  . TEE WITHOUT CARDIOVERSION N/A 08/20/2017   Procedure: TRANSESOPHAGEAL ECHOCARDIOGRAM (TEE);  Surgeon: Evans Lance, MD;  Location: [facility];  Service: Cardiovascular;  Laterality: N/A;  . THROMBECTOMY W/ EMBOLECTOMY Left 12/25/2014   Procedure: THROMBECTOMY ARTERIOVENOUS FISTULA;  Surgeon: Elam Dutch, MD;  Location[facility]  OR;  Service: Vascular;  Laterality: Left;  . THYROIDECTOMY  2011  . TOE AMPUTATION Bilateral     right 1st and  2nd digits; left 1st and 3rd digits"  . UMBILICAL HERNIA REPAIR  X 2  . UPPER EXTREMITY VENOGRAPHY Right 11/05/2017   Procedure: UPPER EXTREMITY VENOGRAPHY;  Surgeon: Conrad Equality, MD;  Location: [place] CV LAB;  Service: Cardiovascular;  Laterality: Right;  Marland Kitchen VASCULAR ACCESS DEVICE INSERTION Left 11/10/2017   Procedure: INSERTION OF SUPER HERO VASCULAR ACCESS DEVICE LEFT ARM;  Surgeon: Angelia Mould, MD;  Location: [facility];  Service: Vascular;  Laterality: Left;  Marland Kitchen VEIN HARVEST Left  08/24/2017   Procedure: VEIN HARVEST;  Surgeon: Conrad Walbridge, MD;  Location: [facility] OR;  Service: Vascular;  Laterality: Left;        Home Medications    Prior to Admission medications   Medication Sig Start Date End Date Taking? Authorizing Provider  ambrisentan (LETAIRIS) 10 MG tablet Take 1 tablet (10 mg total) by mouth daily. 09/11/17   Juanito Doom, MD  amiodarone (PACERONE) 200 MG tablet Take 200 mg by mouth every evening.     [provider]  apixaban (ELIQUIS) 2.5 MG TABS tablet Take 1 tablet (2.5 mg total) by mouth 2 (two) times daily. 11/11/17   Shirley, [place], DO  atorvastatin (LIPITOR) 40 MG tablet Take 1 tablet (40 mg total) by mouth every evening. 12/11/17   Diallo, Earna Coder, MD  cinacalcet (SENSIPAR) 30 MG tablet Take 3 tablets (90 mg total) by mouth every evening. 09/26/17   Shirley, [place], DO  ELIQUIS 2.5 MG TABS tablet TAKE 1 TABLET BY MOUTH TWICE A DAY 12/11/17   Diallo, Earna Coder, MD  levothyroxine (SYNTHROID, LEVOTHROID) 300 MCG tablet Take 1 tablet (300 mcg total) by mouth daily before breakfast. 09/26/17   Shirley, [place], DO  midodrine (PROAMATINE) 10 MG tablet TAKE 1 TABLET (10 MG TOTAL) BY MOUTH 3 (THREE) TIMES DAILY. 10/08/17   Minus Breeding, MD  multivitamin (RENA-VIT) TABS tablet TAKE 1 TABLET BY MOUTH EVERYDAY AT BEDTIME 12/11/17   Diallo, Abdoulaye, MD  nystatin (MYCOSTATIN/NYSTOP) powder Apply topically 4 (four) times daily. 11/18/17   Diallo, Earna Coder, MD  pantoprazole (PROTONIX) 40 MG tablet Take 1 tablet (40 mg total) by mouth daily. For 14 days, then stop. Patient not taking: Reported on 12/03/2017 11/11/17   Meccariello, Bernita Raisin, DO  tadalafil (ADCIRCA/CIALIS) 20 MG tablet Take 20 mg by mouth daily.  10/06/17   [provider]  traMADol (ULTRAM) 50 MG tablet Take 1 tablet (50 mg total) by mouth every 6 (six) hours as needed for severe pain. 11/11/17   Shirley, [place], DO    Family History Family History  Problem Relation Age of Onset  . Heart  failure Mother   . Hypertension Mother   . CAD Mother 31  . Emphysema Mother   . Hypertension Father   . Kidney failure Father     Social History Social History   Tobacco Use  . Smoking status: Never Smoker  . Smokeless tobacco: Never Used  Substance Use Topics  . Alcohol use: Yes    Alcohol/week: 0.0 oz    Comment: social  . Drug use: No     Allergies   Enalaprilat; Vasotec [enalapril]; and Iodinated diagnostic agents   Review of Systems Review of Systems  Constitutional: Negative for activity change, chills and fever.  HENT: Negative.   Respiratory: Negative for chest tightness and shortness of breath.   Cardiovascular: Negative  for chest pain, palpitations and leg swelling.  Gastrointestinal: Negative.   Genitourinary: Negative.   Musculoskeletal: Negative.   Skin: Positive for wound. Negative for color change and pallor.  Neurological: Negative for dizziness, syncope, weakness, light-headedness and numbness.  Hematological: Negative.   Psychiatric/Behavioral: Negative for confusion and decreased concentration.     Physical Exam Updated Vital Signs BP 106/71 (BP Location: Right Arm)   Pulse 77   Temp 97.7 F (36.5 C) (Oral)   Resp 18   SpO2 98%   Physical Exam  Constitutional: He is cooperative. No distress.  Obese.   Eyes: Pupils are equal, round, and reactive to light. Conjunctivae, EOM and lids are normal.  Cardiovascular: Normal rate, regular rhythm and normal heart sounds.  Pulses:      Femoral pulses are 2+ on the right side, and 2+ on the left side. Pulmonary/Chest: Effort normal and breath sounds normal.  Abdominal: Soft. Normal appearance and bowel sounds are normal. There is no tenderness.  Musculoskeletal: Normal range of motion.  Neurological: He is alert. He has normal strength. No sensory deficit. He exhibits normal muscle tone.  Skin: Skin is warm. No erythema.  Darkened, thick skin in lower extremities consistent with chronic venous  stasis. Varicose veins in lower extremities bilaterally. Cath wound areas scabbed without surrounding erythema. No discharge or bleeding. (See attached picture)  Nursing note and vitals reviewed.       ED Treatments / Results  Labs (all labs ordered are listed, but only abnormal results are displayed) Labs Reviewed  CBC WITH DIFFERENTIAL/PLATELET - Abnormal; Notable for the following components:      Result Value   WBC 3.3 (*)    RBC 3.08 (*)    Hemoglobin 8.4 (*)    HCT 28.4 (*)    MCHC 29.6 (*)    RDW 19.7 (*)    Platelets 98 (*)    Lymphs Abs 0.4 (*)    All other components within normal limits  COMPREHENSIVE METABOLIC PANEL - Abnormal; Notable for the following components:   Creatinine, Ser 5.23 (*)    Calcium 8.4 (*)    Total Protein 6.2 (*)    Albumin 3.1 (*)    Alkaline Phosphatase 151 (*)    GFR calc non Af Amer 11 (*)    GFR calc Af Amer 13 (*)    All other components within normal limits  PROTIME-INR - Abnormal; Notable for the following components:   Prothrombin Time 16.0 (*)    All other components within normal limits    EKG None  Radiology No results found.  Procedures Procedures (including critical care time)  Medications Ordered in ED Medications  midodrine (PROAMATINE) tablet 10 mg (10 mg Oral Given 12/12/17 2036)   Initial Impression / Assessment and Plan / ED Course  Triage vital signs and the nursing notes have been reviewed.  Pertinent labs & imaging results that were available during care of the patient were reviewed and considered in medical decision making (see chart for details).  Patient presents with bleeding from a previous catheter site. On exam, femoral pulses are normal and intact. Bleeding likely came from scab removal. The open area is very superficial and there was no active bleeding from the site and area seems to be scabbing appropriately. No signs of infection such as erythema, warmth or drainage.   Clinical Course as of Dec 13 2247  Sat Dec 12, 2017  1802 Hypotensive at 78/44 in triage. Patient's past BPs ~ low 100s/60s.  Will repeat vital signs. Pt denies symptoms of hypotension such as dizziness, lightheadedness or palpitations.   [GM]  1853 Repeat BP 70/50. Case discussed with Dr. Dorie Rank. Will order labs and EKG to evaluate for hypotension today. Also order arterial ultrasound to evaluate for fistula.   [GM]  2001 EKG showed NSR. No ST elevations/depressions or signs of acute ischemia or infarct. Normal QT interval.   [GM]  2036 Pt's lab are consistent with ESRD patient. Hgb is consistent with his baseline. He is thrombocytopenic and leukopenic today. BP has increased +10 SBP and DBP with midodrine.   [GM]  2115 Ultrasound not to be completed today due to techs leaving. Will arrange ambulatory ultrasound for follow-up. Dr. Tomi Bamberger agreed with discharge plans for today as patient is hemodynamically stable and bleeding is well controlled. BP improved with midodrine and patient states he has a Rx for this home as he has had hypotensive issues at home.   [GM]    Clinical Course User Index [GM] Svara Twyman, Jonelle Sports, PA-C   Final Clinical Impressions(s) / ED Diagnoses  1. Wound Check. Referral for outpatient ultrasound follow-up to evaluate for fistula formation. Area cleaned and dressed in the ED. Advised patient to follow-up with vascular surgeon if this continues to be a concern. Education provided on s/s that would warrant return to the ED vs PCP vs vascular surgeon. 2. Hypotension. Intermittent issue as patient states. Midodrine given in the ED and BP responded appropriately. Pt has home Rx for this and advised continue taking as prescribed. Follow-up with PCP for further management.  Dispo: Home. After thorough clinical evaluation, this patient is determined to be medically stable and can be safely discharged with the previously mentioned treatment and/or outpatient follow-up/referral(s). At this time, there are  no other apparent medical conditions that require further screening, evaluation or treatment.   Final diagnoses:  Visit for wound check  Groin discomfort, left  Hypotension, unspecified hypotension type    ED Discharge Orders        Ordered    US ARTERIAL LOWER EXTREMITY DUPLEX LEFT (NON-ABI)     12/12/17 2119        Junita Push 12/12/17 2249    Dorie Rank, MD 12/13/17 [phone]

## 2017-12-12 NOTE — Discharge Instructions (Addendum)
The area appears to be healing and scabbing as I would expect it to.There are no signs of infection. It is ok for you to leave this area uncovered, but it is also ok for you to cover it with Neosporin and gauze/band-aid.  I have placed a referral for you to come back and get an ultrasound of your leg. That information is below. My recommendation is for you to call Dr. Bridgett Larsson to follow-up about this.  Follow-up with a medical provider sooner if you have one or more of the following symptoms: fever; increased redness, warmth or tenderness at the wound site; pain beyond where the initial wound was; unusual discharge; excessive bleeding with change in color, temperature or sensation in your leg.  Continue to take your Midodrine at home as prescribed for your blood pressure.  Thank you for allowing me to take care of you today!

## 2017-12-12 NOTE — ED Provider Notes (Signed)
EKG Normal sinus rhythm rate 76, low voltage QRS Rightward axis Nonspecific ST-T wave changes No significant change since last tracing  Pt presented to the emergency room for evaluation of bleeding at a prior AV fistula site vascular catheter site.  On exam no active bleeding.  No deep wound or ulceration.  Patient's laboratory tests are stable.  Patient was noted to be hypotensive on arrival.  Patient does have history of hypotension and takes Midodrin.  Patient's blood pressure is improved.  He does not have any signs of infection.  Appears stable for discharge.  Medical screening examination/treatment/procedure(s) were conducted as a shared visit with non-physician practitioner(s) and myself.  I personally evaluated the patient during the encounter.     Dorie Rank, MD 12/12/17 2130

## 2017-12-12 NOTE — ED Notes (Signed)
Pt had dialysis catheter removed from left groin- started bleeding today while at [employer]-- "blood was squirting out" - on palpation, area is hardened around entrance site, not bleeding at present-  Pt requesting to see vascular MD or EDP prior to being discharged.

## 2017-12-14 ENCOUNTER — Other Ambulatory Visit: Payer: Self-pay | Admitting: Family Medicine

## 2017-12-14 DIAGNOSIS — T8189XA Other complications of procedures, not elsewhere classified, initial encounter: Secondary | ICD-10-CM | POA: Diagnosis not present

## 2017-12-14 DIAGNOSIS — N186 End stage renal disease: Secondary | ICD-10-CM | POA: Diagnosis not present

## 2017-12-14 DIAGNOSIS — T8130XA Disruption of wound, unspecified, initial encounter: Secondary | ICD-10-CM | POA: Diagnosis not present

## 2017-12-14 DIAGNOSIS — E669 Obesity, unspecified: Secondary | ICD-10-CM | POA: Diagnosis not present

## 2017-12-14 DIAGNOSIS — T829XXA Unspecified complication of cardiac and vascular prosthetic device, implant and graft, initial encounter: Secondary | ICD-10-CM | POA: Diagnosis not present

## 2017-12-14 DIAGNOSIS — I6529 Occlusion and stenosis of unspecified carotid artery: Secondary | ICD-10-CM | POA: Diagnosis not present

## 2017-12-14 DIAGNOSIS — A419 Sepsis, unspecified organism: Secondary | ICD-10-CM | POA: Diagnosis not present

## 2017-12-14 DIAGNOSIS — Z95810 Presence of automatic (implantable) cardiac defibrillator: Secondary | ICD-10-CM | POA: Diagnosis not present

## 2017-12-14 DIAGNOSIS — J9601 Acute respiratory failure with hypoxia: Secondary | ICD-10-CM | POA: Diagnosis not present

## 2017-12-14 DIAGNOSIS — R7881 Bacteremia: Secondary | ICD-10-CM | POA: Diagnosis not present

## 2017-12-14 NOTE — Telephone Encounter (Signed)
Would hold apixaban 5 days prior to surgery given ESRD and resume after Kirk Ruths

## 2017-12-14 NOTE — Telephone Encounter (Signed)
Unfortunately our office protocol does not address patients with ESRD on DOACs as there is very limited data regarding clearance of these medications in dialysis patients. Will defer to MD regarding appropriate length of time to hold Eliquis to try to ensure appropriate clearance prior to upcoming procedure. Will likely need to hold at least 3 days prior to procedure. Pt takes Eliquis for afib with CHADS2VASc score of 1 (PAD).

## 2017-12-16 ENCOUNTER — Other Ambulatory Visit: Payer: Self-pay

## 2017-12-16 ENCOUNTER — Encounter (HOSPITAL_COMMUNITY): Payer: Self-pay

## 2017-12-16 ENCOUNTER — Emergency Department (HOSPITAL_COMMUNITY)
Admission: EM | Admit: 2017-12-16 | Discharge: 2017-12-17 | Disposition: A | Discharge status: Home / Self Care | Payer: Medicare HMO | Attending: Emergency Medicine | Admitting: Emergency Medicine

## 2017-12-16 DIAGNOSIS — T8189XA Other complications of procedures, not elsewhere classified, initial encounter: Secondary | ICD-10-CM | POA: Diagnosis not present

## 2017-12-16 DIAGNOSIS — N186 End stage renal disease: Secondary | ICD-10-CM | POA: Insufficient documentation

## 2017-12-16 DIAGNOSIS — Y828 Other medical devices associated with adverse incidents: Secondary | ICD-10-CM | POA: Insufficient documentation

## 2017-12-16 DIAGNOSIS — Z79899 Other long term (current) drug therapy: Secondary | ICD-10-CM | POA: Diagnosis not present

## 2017-12-16 DIAGNOSIS — T82838A Hemorrhage of vascular prosthetic devices, implants and grafts, initial encounter: Secondary | ICD-10-CM | POA: Insufficient documentation

## 2017-12-16 DIAGNOSIS — T148XXA Other injury of unspecified body region, initial encounter: Secondary | ICD-10-CM

## 2017-12-16 DIAGNOSIS — Z7901 Long term (current) use of anticoagulants: Secondary | ICD-10-CM | POA: Insufficient documentation

## 2017-12-16 DIAGNOSIS — I120 Hypertensive chronic kidney disease with stage 5 chronic kidney disease or end stage renal disease: Secondary | ICD-10-CM | POA: Diagnosis not present

## 2017-12-16 DIAGNOSIS — L7622 Postprocedural hemorrhage and hematoma of skin and subcutaneous tissue following other procedure: Secondary | ICD-10-CM | POA: Diagnosis not present

## 2017-12-16 LAB — BASIC METABOLIC PANEL
Anion gap: 11 (ref 5–15)
BUN: 10 mg/dL (ref 6–20)
CALCIUM: 8.7 mg/dL — AB (ref 8.9–10.3)
CO2: 30 mmol/L (ref 22–32)
Chloride: 95 mmol/L — ABNORMAL LOW (ref 98–111)
Creatinine, Ser: 4 mg/dL — ABNORMAL HIGH (ref 0.61–1.24)
GFR, EST AFRICAN AMERICAN: 18 mL/min — AB (ref >60)
GFR, EST NON AFRICAN AMERICAN: 15 mL/min — AB (ref >60)
GLUCOSE: 103 mg/dL — AB (ref 70–99)
POTASSIUM: 3.6 mmol/L (ref 3.5–5.1)
Sodium: 136 mmol/L (ref 135–145)

## 2017-12-16 LAB — CBC
HEMATOCRIT: 26.2 % — AB (ref 39.0–52.0)
Hemoglobin: 8.4 g/dL — ABNORMAL LOW (ref 13.0–17.0)
MCH: 27.8 pg (ref 26.0–34.0)
MCHC: 32.1 g/dL (ref 30.0–36.0)
MCV: 86.8 fL (ref 78.0–100.0)
PLATELETS: 167 10*3/uL (ref 150–400)
RBC: 3.02 MIL/uL — AB (ref 4.22–5.81)
RDW: 19.2 % — AB (ref 11.5–15.5)
WBC: 4.3 10*3/uL (ref 4.0–10.5)

## 2017-12-16 LAB — TYPE AND SCREEN
ABO/RH(D): O POS
ANTIBODY SCREEN: NEGATIVE

## 2017-12-16 MED ORDER — SODIUM CHLORIDE 0.9 % IV BOLUS
500.0000 mL | Freq: Once | INTRAVENOUS | Status: AC
  Administered 2017-12-16: 500 mL via INTRAVENOUS

## 2017-12-16 MED ORDER — MIDODRINE HCL 5 MG PO TABS
10.0000 mg | ORAL_TABLET | Freq: Once | ORAL | Status: AC
  Administered 2017-12-16: 10 mg via ORAL
  Filled 2017-12-16: qty 2

## 2017-12-16 MED ORDER — SODIUM CHLORIDE 0.9 % IV SOLN
INTRAVENOUS | Status: DC
  Administered 2017-12-16 (×2): via INTRAVENOUS

## 2017-12-16 NOTE — ED Notes (Signed)
Patient in wheelchair from triage area-patient's left groin area and clothing LLE saturated in blood-assisted patient to bed-dressing to left upper inner thigh area removed and arterial bleed noted-combat gauze dressing with bulky pressure dressing and coban applied-patient states this was from a previous wound for a dialysis shunt that had not healed-PIV started with fluid bolus SBP 88-Cardiac monitor with continuous pulse ox-left pedal pulse doppled-patient is awake and alert and complaining of leg pain

## 2017-12-16 NOTE — ED Notes (Signed)
Patient awake and alert and complaining 6/10 left upper thigh area

## 2017-12-16 NOTE — ED Notes (Signed)
Patient's family member reported he has an appointment with Dr. [name], vascular, tomorrow. Informed Dr. Gloris Manchester.

## 2017-12-17 ENCOUNTER — Telehealth: Payer: Self-pay | Admitting: *Deleted

## 2017-12-17 ENCOUNTER — Other Ambulatory Visit: Payer: Self-pay

## 2017-12-17 ENCOUNTER — Other Ambulatory Visit: Payer: Self-pay | Admitting: *Deleted

## 2017-12-17 ENCOUNTER — Encounter: Payer: Self-pay | Admitting: *Deleted

## 2017-12-17 ENCOUNTER — Encounter: Payer: Self-pay | Admitting: Vascular Surgery

## 2017-12-17 ENCOUNTER — Ambulatory Visit (INDEPENDENT_AMBULATORY_CARE_PROVIDER_SITE_OTHER): Payer: Medicare HMO | Admitting: Vascular Surgery

## 2017-12-17 VITALS — BP 81/52 | HR 71 | Temp 97.0°F | Resp 18 | Ht 73.0 in | Wt 276.0 lb

## 2017-12-17 DIAGNOSIS — Z992 Dependence on renal dialysis: Secondary | ICD-10-CM | POA: Diagnosis not present

## 2017-12-17 DIAGNOSIS — N186 End stage renal disease: Secondary | ICD-10-CM | POA: Diagnosis not present

## 2017-12-17 LAB — ABO/RH: ABO/RH(D): O POS

## 2017-12-17 NOTE — H&P (View-Only) (Signed)
Patient is a 57 year old male who has been having intermittent bleeding from a previously removed left thigh catheter site.  Catheter was apparently removed in June 2019.  He has had intermittent bleeding and ulceration since then.  He currently is using a left upper arm hero graft for his dialysis.  His dialysis day is Monday Wednesday Friday.  He is on Eliquis for atrial fibrillation.  Review of systems: He denies any fever or chills.  Past Medical History:  Diagnosis Date  . AICD (automatic cardioverter/defibrillator) present    Medtronic   . Aortic stenosis    moderate AS by 08/2016 echo  . Arthritis    "knees" (08/05/2016)  . Atrial fibrillation ([place])   . ESRD (end stage renal disease) on dialysis ([place])    "M, T, W, T, F; NX stage hemodialysis; I do it at home" (08/05/2016)  . Great toe amputation status ([place])    status post left hallux amputation 08/01/14  . History of blood transfusion 2009   "S/P biopsy for prostate cancer check"  . Hypertension   . Hypothyroidism (acquired)   . Mitral stenosis    moderate mitral stenosis  . Nonischemic cardiomyopathy ([place])   . Pneumonia 2014; 08/05/2016  . PONV (postoperative nausea and vomiting)   . Pulmonary HTN ([place])   . Renal insufficiency   . Thyroid cancer ([place]) 2011    Past Surgical History:  Procedure Laterality Date  . ARTERIAL LINE INSERTION Right 08/24/2017   Procedure: ARTERIAL LINE INSERTION INTO RIGHT FEMORAL ARTERY;  Surgeon: Conrad Dwight, MD;  Location: [facility];  Service: Vascular;  Laterality: Right;  . BASCILIC VEIN TRANSPOSITION Right 10/13/2017   Procedure: FIRST STAGE BRACHIAL VEIN TRANSPOSITION RIGHT ARM;  Surgeon: Conrad Spencer, MD;  Location: [place];  Service: Vascular;  Laterality: Right;  . BIOPSY  09/25/2017   Procedure: BIOPSY;  Surgeon: Ronnette Juniper, MD;  Location: [facility] ENDOSCOPY;  Service: Gastroenterology;;  . CARDIAC CATHETERIZATION Right 2017  . CARDIAC DEFIBRILLATOR PLACEMENT  2009  . DIALYSIS FISTULA CREATION  Left 2003   "upper arm"  . DIALYSIS/PERMA CATHETER REMOVAL Left 09/01/2017   Procedure: DIALYSIS/PERMA CATHETER REMOVAL;  Surgeon: Conrad Staples, MD;  Location: [place];  Service: Vascular;  Laterality: Left;  . ESOPHAGOGASTRODUODENOSCOPY (EGD) WITH PROPOFOL N/A 09/25/2017   Procedure: ESOPHAGOGASTRODUODENOSCOPY (EGD) WITH PROPOFOL;  Surgeon: Ronnette Juniper, MD;  Location: [place];  Service: Gastroenterology;  Laterality: N/A;  . HERNIA REPAIR    . I&D EXTREMITY Right 11/06/2017   Procedure: IRRIGATION AND DEBRIDEMENT KNEE;  Surgeon: Rod Can, MD;  Location: [place];  Service: Orthopedics;  Laterality: Right;  . ICD LEAD REMOVAL Right 08/20/2017   Procedure: ICD EXTRACTION WITH C-ARM;  Surgeon: Evans Lance, MD;  Location: [facility] OR;  Service: Cardiovascular;  Laterality: Right;  DR VAN TRIGT TO BACK-UP  . icd removed    . INSERTION OF DIALYSIS CATHETER N/A 11/11/2016   Procedure: INSERTION OF TUNNELED DIALYSIS CATHETER;  Surgeon: Angelia Mould, MD;  Location: [place];  Service: Vascular;  Laterality: N/A;  . INSERTION OF DIALYSIS CATHETER Left 08/24/2017   Procedure: INSERTION OF DIALYSIS CATHETER;  Surgeon: Conrad [place], MD;  Location: [place];  Service: Vascular;  Laterality: Left;  . INSERTION OF DIALYSIS CATHETER Left 09/01/2017   Procedure: INSERTION OF DIALYSIS CATHETER;  Surgeon: Conrad , MD;  Location: [place];  Service: Vascular;  Laterality: Left;  . KIDNEY TRANSPLANT  2009  . KNEE SURGERY Bilateral 2000s   "drained  fluid"  . LAPAROSCOPIC GASTRIC BANDING  2006  . LIGATION OF ARTERIOVENOUS  FISTULA Right 11/10/2017   Procedure: LIGATION OF BRACHIAL VEIN TRANSPOSITION RIGHT ARM;  Surgeon: Angelia Mould, MD;  Location: [place];  Service: Vascular;  Laterality: Right;  . PROSTATE BIOPSY  2009  . REVISON OF ARTERIOVENOUS FISTULA Left 2/62/0355   Procedure: PLICATION OF ARTERIOVENOUS FISTULA ANEURYSM;  Surgeon: Elam Dutch, MD;  Location: [place];  Service: Vascular;   Laterality: Left;  . REVISON OF ARTERIOVENOUS FISTULA Left 11/10/2016   Procedure: REVISON OF ARTERIOVENOUS FISTULA - LEFT UPPER ARM;  Surgeon: Elam Dutch, MD;  Location: [place];  Service: Vascular;  Laterality: Left;  . REVISON OF ARTERIOVENOUS FISTULA Left 08/24/2017   Procedure: REVISON OF ARTERIOVENOUS FISTULA LEFT;  Surgeon: Conrad Glasscock, MD;  Location: [place];  Service: Vascular;  Laterality: Left;  . TEE WITHOUT CARDIOVERSION N/A 08/20/2017   Procedure: TRANSESOPHAGEAL ECHOCARDIOGRAM (TEE);  Surgeon: Evans Lance, MD;  Location: [place];  Service: Cardiovascular;  Laterality: N/A;  . THROMBECTOMY W/ EMBOLECTOMY Left 12/25/2014   Procedure: THROMBECTOMY ARTERIOVENOUS FISTULA;  Surgeon: Elam Dutch, MD;  Location: [place];  Service: Vascular;  Laterality: Left;  . THYROIDECTOMY  2011  . TOE AMPUTATION Bilateral     right 1st and  2nd digits; left 1st and 3rd digits"  . UMBILICAL HERNIA REPAIR  X 2  . UPPER EXTREMITY VENOGRAPHY Right 11/05/2017   Procedure: UPPER EXTREMITY VENOGRAPHY;  Surgeon: Conrad Bithlo, MD;  Location: [place] CV LAB;  Service: Cardiovascular;  Laterality: Right;  Marland Kitchen VASCULAR ACCESS DEVICE INSERTION Left 11/10/2017   Procedure: INSERTION OF SUPER HERO VASCULAR ACCESS DEVICE LEFT ARM;  Surgeon: Angelia Mould, MD;  Location: [place];  Service: Vascular;  Laterality: Left;  Marland Kitchen VEIN HARVEST Left 08/24/2017   Procedure: VEIN HARVEST;  Surgeon: Conrad [place], MD;  Location: [facility] OR;  Service: Vascular;  Laterality: Left;     Physical exam:  Vitals:   12/17/17 1248 12/17/17 1249  BP:  (!) 81/52  Pulse:  71  Resp: 18 18  Temp:  (!) 97 F (36.1 C)  TempSrc:  Oral  SpO2:  98%  Weight: 276 lb (125.2 kg) 276 lb (125.2 kg)  Height: 6\' 1"  (1.854 m) 6\' 1"  (1.854 m)    Left upper extremity: Audible bruit in graft  Left thigh 2 areas 2 cm in diameter ulcerated very friable tissue with a small amount of oozing no significant hematoma no purulent drainage no  erythema  Assessment: Ulcerated left thigh segment from previous femoral venous catheter.  I am not really sure why he continues to ooze from this and it has not healed.  I believe the best option at this point would be to excise the 2 ulcerations and resutured these.  Plan: Excision of 2 left thigh ulcers and closure on Tuesday, December 22, 2017.  Risk benefits possible complications and procedure details were discussed with the patient today and he agrees to proceed  [name], MD [facility] Office: [phone] Pager: [phone]

## 2017-12-17 NOTE — Telephone Encounter (Signed)
   From a cardiac standpoint, he is doing well. NICM, no sig CAD, compliant w/ rx for PAH, and maintaining SR. No prohibition to general anesthesia.  However, his risk is increased because of anemia and deconditioning. May be preferable to delay the surgery till anemia eval is completed and pt has had a chance to build some strength. Decision per Dr Rosendo Gros.  See Dr Jacalyn Lefevre note about holding Eliquis x 5 days for the surgery.   Rosaria Ferries, PA-C 12/17/2017 9:33 AM Beeper [phone]

## 2017-12-17 NOTE — Telephone Encounter (Signed)
   [facility] Pre-operative Risk Assessment    Request for surgical clearance:  What type of surgery is being performed?  EXCISION LEFT THIGH WOUND  1. When is this surgery scheduled?  12/22/17  2. What type of clearance is required (medical clearance vs. Pharmacy clearance to hold med vs. Both)? BOTH  3. Are there any medications that need to be held prior to surgery and how long? ELIQUIS  4. Practice name and name of physician performing surgery?  [facility]  [address] [facility]  5. What is your office phone number [phone]   7.   What is your office fax number [phone]  8.   Anesthesia type (None, local, MAC, general) ?  MAC    Devra Dopp 12/17/2017, 2:04 PM  _________________________________________________________________   (provider comments below)

## 2017-12-17 NOTE — Discharge Instructions (Addendum)
Follow-up with Dr. [name] in his office as scheduled tomorrow.  Keep the dressing that we applied in place and do not remove it.  If bleeding does recur return either here or a [facility].  Admission would probably be required at [facility].  Continue all your current medications.

## 2017-12-17 NOTE — ED Provider Notes (Signed)
[facility] DEPT Provider Note   CSN: [id number] Arrival date & time: 12/16/17  2041     History   Chief Complaint Chief Complaint  Patient presents with  . Arterial bleed left upper leg    HPI Orris Perin. is a 57 y.o. male.  Patient presenting with bleeding from the cold dialysis catheter site in the left anterior thigh groin area.  Same thing happened on Saturday.  Patient had a dressing placed.  Patient arrived at that time hypotensive patient was given his normal medicine the Midodrine patient was discharged home with follow-up with vascular surgery he has follow-up scheduled for tomorrow.  Patient had this catheter removed several months ago.  Had bleeding with that in the past but then it settled down was seen by vascular surgery wounds appeared to be healing and then on Saturday it restarted with the bleeding.  Patient is on Eliquis for atrial fibrillation.  Patient is a dialysis patient normally dialyzed Monday Wednesdays and Fridays.  He was dialyzed today completely.  It started bleeding profusely just shortly prior to arrival.     Past Medical History:  Diagnosis Date  . AICD (automatic cardioverter/defibrillator) present    Medtronic   . Aortic stenosis    moderate AS by 08/2016 echo  . Arthritis    "knees" (08/05/2016)  . Atrial fibrillation ([place])   . ESRD (end stage renal disease) on dialysis ([place])    "M, T, W, T, F; NX stage hemodialysis; I do it at home" (08/05/2016)  . Great toe amputation status ([place])    status post left hallux amputation 08/01/14  . History of blood transfusion 2009   "S/P biopsy for prostate cancer check"  . Hypertension   . Hypothyroidism (acquired)   . Mitral stenosis    moderate mitral stenosis  . Nonischemic cardiomyopathy ([place])   . Pneumonia 2014; 08/05/2016  . PONV (postoperative nausea and vomiting)   . Pulmonary HTN ([place])   . Renal insufficiency   . Thyroid cancer [facility]) 2011    Patient  Active Problem List   Diagnosis Date Noted  . Nodule of chest wall 11/18/2017  . Calciphylaxis   . Abscess   . Acute pain of right knee   . Malnutrition of moderate degree 11/04/2017  . Septic arthritis of knee, right ([place]) 11/02/2017  . Dialysis AV fistula infection, sequela 11/02/2017  . Mass 10/29/2017  . Near syncope 09/24/2017  . Syncope, cardiogenic   . ESRD on hemodialysis ([place])   . Anemia 09/16/2017  . Diarrhea 09/16/2017  . Tarry stools 09/16/2017  . Acute encephalopathy   . Hypotension   . Complication of arteriovenous dialysis fistula   . Stage 5 chronic kidney disease on chronic dialysis ([place])   . 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 MSSA bacteremia 08/18/2017  . Sepsis ([place]) 08/17/2017  . Lymphadenopathy, mediastinal 04/10/2017  . Hypothyroidism 10/09/2016  . Acute respiratory failure with hypoxia ([place]) 08/06/2016  . HCAP (healthcare-associated pneumonia) 08/05/2016  . Thyroid cancer ([place]) 06/12/2016  . Epistaxis 03/04/2016  . Long term current use of amiodarone 01/10/2016  . Critical lower limb ischemia 12/04/2015  . Carotid artery stenosis 05/24/2015  . Excess skin 05/24/2015  . 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 Status post amputation 04/19/2015  . Wound dehiscence 04/19/2015  . Sinus congestion 03/08/2015  . Bleeding pseudoaneurysm of left brachiocephalic arteriovenous fistula ([place]) 12/24/2014  . Pure hypercholesterolemia 11/09/2014  . Obesity 11/07/2014  . Healthcare maintenance 11/07/2014  .  H/O malignant neoplasm of thyroid 11/07/2014  . Toe amputation status ([place]) 10/13/2014  . ICD (implantable cardioverter-defibrillator) in place 07/25/2014  . ESRD (end stage renal disease) on hemodialysis 07/25/2014  . End-stage renal disease ([place]) 07/25/2014  . Automatic implantable cardioverter-defibrillator in situ 07/25/2014  . Pulmonary hypertension ([place]) 2020-10-2014  . Secondary pulmonary hypertension 2020-10-2014  . Atrial fibrillation [I48.91]  05/24/2014  . Long term current use of anticoagulant therapy 05/24/2014    Past Surgical History:  Procedure Laterality Date  . ARTERIAL LINE INSERTION Right 08/24/2017   Procedure: ARTERIAL LINE INSERTION INTO RIGHT FEMORAL ARTERY;  Surgeon: Conrad [place]wood, MD;  Location: [place];  Service: Vascular;  Laterality: Right;  . BASCILIC VEIN TRANSPOSITION Right 10/13/2017   Procedure: FIRST STAGE BRACHIAL VEIN TRANSPOSITION RIGHT ARM;  Surgeon: Conrad Negley, MD;  Location: [place];  Service: Vascular;  Laterality: Right;  . BIOPSY  09/25/2017   Procedure: BIOPSY;  Surgeon: Ronnette Juniper, MD;  Location: [facility] ENDOSCOPY;  Service: Gastroenterology;;  . CARDIAC CATHETERIZATION Right 2017  . CARDIAC DEFIBRILLATOR PLACEMENT  2009  . DIALYSIS FISTULA CREATION Left 2003   "upper arm"  . DIALYSIS/PERMA CATHETER REMOVAL Left 09/01/2017   Procedure: DIALYSIS/PERMA CATHETER REMOVAL;  Surgeon: Conrad Millerton, MD;  Location: [place];  Service: Vascular;  Laterality: Left;  . ESOPHAGOGASTRODUODENOSCOPY (EGD) WITH PROPOFOL N/A 09/25/2017   Procedure: ESOPHAGOGASTRODUODENOSCOPY (EGD) WITH PROPOFOL;  Surgeon: Ronnette Juniper, MD;  Location: [place];  Service: Gastroenterology;  Laterality: N/A;  . HERNIA REPAIR    . I&D EXTREMITY Right 11/06/2017   Procedure: IRRIGATION AND DEBRIDEMENT KNEE;  Surgeon: Rod Can, MD;  Location: [place];  Service: Orthopedics;  Laterality: Right;  . ICD LEAD REMOVAL Right 08/20/2017   Procedure: ICD EXTRACTION WITH C-ARM;  Surgeon: Evans Lance, MD;  Location: [facility] OR;  Service: Cardiovascular;  Laterality: Right;  DR VAN TRIGT TO BACK-UP  . icd removed    . INSERTION OF DIALYSIS CATHETER N/A 11/11/2016   Procedure: INSERTION OF TUNNELED DIALYSIS CATHETER;  Surgeon: Angelia Mould, MD;  Location: [place];  Service: Vascular;  Laterality: N/A;  . INSERTION OF DIALYSIS CATHETER Left 08/24/2017   Procedure: INSERTION OF DIALYSIS CATHETER;  Surgeon: Conrad Hoxie, MD;  Location: [place];   Service: Vascular;  Laterality: Left;  . INSERTION OF DIALYSIS CATHETER Left 09/01/2017   Procedure: INSERTION OF DIALYSIS CATHETER;  Surgeon: Conrad [place] Shires, MD;  Location: [place];  Service: Vascular;  Laterality: Left;  . KIDNEY TRANSPLANT  2009  . KNEE SURGERY Bilateral 2000s   "drained fluid"  . LAPAROSCOPIC GASTRIC BANDING  2006  . LIGATION OF ARTERIOVENOUS  FISTULA Right 11/10/2017   Procedure: LIGATION OF BRACHIAL VEIN TRANSPOSITION RIGHT ARM;  Surgeon: Angelia Mould, MD;  Location: [place];  Service: Vascular;  Laterality: Right;  . PROSTATE BIOPSY  2009  . REVISON OF ARTERIOVENOUS FISTULA Left 10/11/930   Procedure: PLICATION OF ARTERIOVENOUS FISTULA ANEURYSM;  Surgeon: Elam Dutch, MD;  Location: [place];  Service: Vascular;  Laterality: Left;  . REVISON OF ARTERIOVENOUS FISTULA Left 11/10/2016   Procedure: REVISON OF ARTERIOVENOUS FISTULA - LEFT UPPER ARM;  Surgeon: Elam Dutch, MD;  Location: [place];  Service: Vascular;  Laterality: Left;  . REVISON OF ARTERIOVENOUS FISTULA Left 08/24/2017   Procedure: REVISON OF ARTERIOVENOUS FISTULA LEFT;  Surgeon: Conrad , MD;  Location: [place];  Service: Vascular;  Laterality: Left;  . TEE WITHOUT CARDIOVERSION N/A 08/20/2017   Procedure: TRANSESOPHAGEAL  ECHOCARDIOGRAM (TEE);  Surgeon: Evans Lance, MD;  Location: [place];  Service: Cardiovascular;  Laterality: N/A;  . THROMBECTOMY W/ EMBOLECTOMY Left 12/25/2014   Procedure: THROMBECTOMY ARTERIOVENOUS FISTULA;  Surgeon: Elam Dutch, MD;  Location: [place];  Service: Vascular;  Laterality: Left;  . THYROIDECTOMY  2011  . TOE AMPUTATION Bilateral     right 1st and  2nd digits; left 1st and 3rd digits"  . UMBILICAL HERNIA REPAIR  X 2  . UPPER EXTREMITY VENOGRAPHY Right 11/05/2017   Procedure: UPPER EXTREMITY VENOGRAPHY;  Surgeon: Conrad Cowles, MD;  Location: [place] CV LAB;  Service: Cardiovascular;  Laterality: Right;  Marland Kitchen VASCULAR ACCESS DEVICE INSERTION Left 11/10/2017    Procedure: INSERTION OF SUPER HERO VASCULAR ACCESS DEVICE LEFT ARM;  Surgeon: Angelia Mould, MD;  Location: [place];  Service: Vascular;  Laterality: Left;  Marland Kitchen VEIN HARVEST Left 08/24/2017   Procedure: VEIN HARVEST;  Surgeon: Conrad Peridot, MD;  Location: [facility] OR;  Service: Vascular;  Laterality: Left;        Home Medications    Prior to Admission medications   Medication Sig Start Date End Date Taking? Authorizing Provider  ambrisentan (LETAIRIS) 10 MG tablet Take 1 tablet (10 mg total) by mouth daily. 09/11/17  Yes Juanito Doom, MD  amiodarone (PACERONE) 200 MG tablet Take 200 mg by mouth daily.    Yes [provider]  apixaban (ELIQUIS) 2.5 MG TABS tablet Take 1 tablet (2.5 mg total) by mouth 2 (two) times daily. 11/11/17  Yes Enid Derry, [place], DO  atorvastatin (LIPITOR) 40 MG tablet Take 1 tablet (40 mg total) by mouth every evening. 12/11/17  Yes Diallo, Abdoulaye, MD  cinacalcet (SENSIPAR) 30 MG tablet Take 3 tablets (90 mg total) by mouth every evening. 09/26/17  Yes Enid Derry, [place], DO  levothyroxine (SYNTHROID, LEVOTHROID) 300 MCG tablet Take 1 tablet (300 mcg total) by mouth daily before breakfast. 09/26/17  Yes Enid Derry, [place], DO  midodrine (PROAMATINE) 10 MG tablet TAKE 1 TABLET (10 MG TOTAL) BY MOUTH 3 (THREE) TIMES DAILY. 10/08/17  Yes Minus Breeding, MD  multivitamin (RENA-VIT) TABS tablet TAKE 1 TABLET BY MOUTH EVERYDAY AT BEDTIME 12/11/17  Yes Diallo, Abdoulaye, MD  nystatin (MYCOSTATIN/NYSTOP) powder Apply topically 4 (four) times daily. Patient taking differently: Apply 1 g topically daily.  11/18/17  Yes Diallo, Abdoulaye, MD  tadalafil (ADCIRCA/CIALIS) 20 MG tablet Take 20 mg by mouth daily.  10/06/17  Yes [provider]  ELIQUIS 2.5 MG TABS tablet TAKE 1 TABLET BY MOUTH TWICE A DAY Patient not taking: Reported on 12/15/2017 12/11/17   Diallo, Earna Coder, MD  ELIQUIS 2.5 MG TABS tablet TAKE 1 TABLET BY MOUTH TWICE A DAY Patient not taking: Reported on  12/15/2017 12/14/17   Diallo, Earna Coder, MD  pantoprazole (PROTONIX) 40 MG tablet Take 1 tablet (40 mg total) by mouth daily. For 14 days, then stop. Patient not taking: Reported on 12/03/2017 11/11/17   Meccariello, Bernita Raisin, DO  traMADol (ULTRAM) 50 MG tablet Take 1 tablet (50 mg total) by mouth every 6 (six) hours as needed for severe pain. Patient not taking: Reported on 12/15/2017 11/11/17   Shirley, [place], DO    Family History Family History  Problem Relation Age of Onset  . Heart failure Mother   . Hypertension Mother   . CAD Mother 76  . Emphysema Mother   . Hypertension Father   . Kidney failure Father     Social History Social History   Tobacco Use  .  Smoking status: Never Smoker  . Smokeless tobacco: Never Used  Substance Use Topics  . Alcohol use: Yes    Alcohol/week: 0.0 oz    Comment: social  . Drug use: No     Allergies   Vasotec [enalapril] and Iodinated diagnostic agents   Review of Systems Review of Systems  Constitutional: Negative for fever.  HENT: Negative for congestion.   Eyes: Negative for visual disturbance.  Respiratory: Negative for shortness of breath.   Cardiovascular: Negative for chest pain.  Gastrointestinal: Negative for abdominal pain.  Skin: Positive for wound.  Neurological: Negative for syncope.  Psychiatric/Behavioral: Negative for confusion.     Physical Exam Updated Vital Signs BP 91/64   Pulse 66   Temp 97.6 F (36.4 C) (Oral)   Resp 16   Ht 1.854 m (6\' 1" )   Wt 124 kg (273 lb 5.9 oz)   SpO2 94%   BMI 36.07 kg/m   Physical Exam  Constitutional: He is oriented to person, place, and time. He appears well-developed and well-nourished. No distress.  HENT:  Head: Normocephalic and atraumatic.  Mouth/Throat: Oropharynx is clear and moist.  Eyes: Pupils are equal, round, and reactive to light. Conjunctivae and EOM are normal.  Neck: Neck supple.  Cardiovascular: Normal rate, regular rhythm and normal heart sounds.    Pulmonary/Chest: Effort normal and breath sounds normal.  Abdominal: Soft. Bowel sounds are normal.  Musculoskeletal: Normal range of motion.  Patient with a functional AV fistula left upper arm.  Patient's left anterior thigh groin area when bed bandage was removed with spurting of venous type blood.  2 puncture wound bleeding from the lower one patient states that the one where it normally bleeds from.  Combat hemostatic dressing applied and it was wrapped.  Bleeding well controlled.  Doppler pulses are present.  Palpable femoral pulses.  Patient partial amputations of the toes of left foot.  Neurological: He is alert and oriented to person, place, and time.  Nursing note and vitals reviewed.    ED Treatments / Results  Labs (all labs ordered are listed, but only abnormal results are displayed) Labs Reviewed  CBC - Abnormal; Notable for the following components:      Result Value   RBC 3.02 (*)    Hemoglobin 8.4 (*)    HCT 26.2 (*)    RDW 19.2 (*)    All other components within normal limits  BASIC METABOLIC PANEL - Abnormal; Notable for the following components:   Chloride 95 (*)    Glucose, Bld 103 (*)    Creatinine, Ser 4.00 (*)    Calcium 8.7 (*)    GFR calc non Af Amer 15 (*)    GFR calc Af Amer 18 (*)    All other components within normal limits  TYPE AND SCREEN    EKG None  Radiology No results found.  Procedures Procedures (including critical care time)  Medications Ordered in ED Medications  0.9 %  sodium chloride infusion ( Intravenous New Bag/Given 12/16/17 2308)  sodium chloride 0.9 % bolus 500 mL (500 mLs Intravenous New Bag/Given 12/16/17 2136)  midodrine (PROAMATINE) tablet 10 mg (10 mg Oral Given 12/16/17 2309)     Initial Impression / Assessment and Plan / ED Course  I have reviewed the triage vital signs and the nursing notes.  Pertinent labs & imaging results that were available during my care of the patient were reviewed by me and considered in my  medical decision making (see chart for  details).     Patient did require for low blood pressure read dosing of his Midrin.  Pressures came up into the 07W systolic patient felt fine.  No further bleeding from the wounds.  Patient has follow-up with vascular surgery tomorrow.  Patient comfortable with discharge home.  Will return for any new or worse symptoms.  Final Clinical Impressions(s) / ED Diagnoses   Final diagnoses:  Bleeding from wound    ED Discharge Orders    None       Fredia Sorrow, MD 12/17/17 [phone]

## 2017-12-17 NOTE — Progress Notes (Signed)
Patient is a 57 year old male who has been having intermittent bleeding from a previously removed left thigh catheter site.  Catheter was apparently removed in June 2019.  He has had intermittent bleeding and ulceration since then.  He currently is using a left upper arm hero graft for his dialysis.  His dialysis day is Monday Wednesday Friday.  He is on Eliquis for atrial fibrillation.  Review of systems: He denies any fever or chills.  Past Medical History:  Diagnosis Date  . AICD (automatic cardioverter/defibrillator) present    [employer]   . Aortic stenosis    moderate AS by 08/2016 echo  . Arthritis    "knees" (08/05/2016)  . Atrial fibrillation ([place])   . ESRD (end stage renal disease) on dialysis ([place])    "M, T, W, T, F; NX stage hemodialysis; I do it at home" (08/05/2016)  . Great toe amputation status ([place])    status post left hallux amputation 08/01/14  . History of blood transfusion 2009   "S/P biopsy for prostate cancer check"  . Hypertension   . Hypothyroidism (acquired)   . Mitral stenosis    moderate mitral stenosis  . Nonischemic cardiomyopathy ([place])   . Pneumonia 2014; 08/05/2016  . PONV (postoperative nausea and vomiting)   . Pulmonary HTN ([place])   . Renal insufficiency   . Thyroid cancer ([place]) 2011    Past Surgical History:  Procedure Laterality Date  . ARTERIAL LINE INSERTION Right 08/24/2017   Procedure: ARTERIAL LINE INSERTION INTO RIGHT FEMORAL ARTERY;  Surgeon: Conrad Florence, MD;  Location: [place];  Service: Vascular;  Laterality: Right;  . BASCILIC VEIN TRANSPOSITION Right 10/13/2017   Procedure: FIRST STAGE BRACHIAL VEIN TRANSPOSITION RIGHT ARM;  Surgeon: Conrad Chesterhill, MD;  Location: [place];  Service: Vascular;  Laterality: Right;  . BIOPSY  09/25/2017   Procedure: BIOPSY;  Surgeon: Ronnette Juniper, MD;  Location: [facility] ENDOSCOPY;  Service: Gastroenterology;;  . CARDIAC CATHETERIZATION Right 2017  . CARDIAC DEFIBRILLATOR PLACEMENT  2009  . DIALYSIS FISTULA CREATION  Left 2003   "upper arm"  . DIALYSIS/PERMA CATHETER REMOVAL Left 09/01/2017   Procedure: DIALYSIS/PERMA CATHETER REMOVAL;  Surgeon: Conrad Viola, MD;  Location: [place];  Service: Vascular;  Laterality: Left;  . ESOPHAGOGASTRODUODENOSCOPY (EGD) WITH PROPOFOL N/A 09/25/2017   Procedure: ESOPHAGOGASTRODUODENOSCOPY (EGD) WITH PROPOFOL;  Surgeon: Ronnette Juniper, MD;  Location: [place];  Service: Gastroenterology;  Laterality: N/A;  . HERNIA REPAIR    . I&D EXTREMITY Right 11/06/2017   Procedure: IRRIGATION AND DEBRIDEMENT KNEE;  Surgeon: Rod Can, MD;  Location: [place];  Service: Orthopedics;  Laterality: Right;  . ICD LEAD REMOVAL Right 08/20/2017   Procedure: ICD EXTRACTION WITH C-ARM;  Surgeon: Evans Lance, MD;  Location: [facility] OR;  Service: Cardiovascular;  Laterality: Right;  DR VAN TRIGT TO BACK-UP  . icd removed    . INSERTION OF DIALYSIS CATHETER N/A 11/11/2016   Procedure: INSERTION OF TUNNELED DIALYSIS CATHETER;  Surgeon: Angelia Mould, MD;  Location: [place];  Service: Vascular;  Laterality: N/A;  . INSERTION OF DIALYSIS CATHETER Left 08/24/2017   Procedure: INSERTION OF DIALYSIS CATHETER;  Surgeon: Conrad [place], MD;  Location: [place];  Service: Vascular;  Laterality: Left;  . INSERTION OF DIALYSIS CATHETER Left 09/01/2017   Procedure: INSERTION OF DIALYSIS CATHETER;  Surgeon: Conrad [place], MD;  Location: [place];  Service: Vascular;  Laterality: Left;  . KIDNEY TRANSPLANT  2009  . KNEE SURGERY Bilateral 2000s   "drained  fluid"  . LAPAROSCOPIC GASTRIC BANDING  2006  . LIGATION OF ARTERIOVENOUS  FISTULA Right 11/10/2017   Procedure: LIGATION OF BRACHIAL VEIN TRANSPOSITION RIGHT ARM;  Surgeon: Angelia Mould, MD;  Location: [place];  Service: Vascular;  Laterality: Right;  . PROSTATE BIOPSY  2009  . REVISON OF ARTERIOVENOUS FISTULA Left 08/18/8117   Procedure: PLICATION OF ARTERIOVENOUS FISTULA ANEURYSM;  Surgeon: Elam Dutch, MD;  Location: [place];  Service: Vascular;   Laterality: Left;  . REVISON OF ARTERIOVENOUS FISTULA Left 11/10/2016   Procedure: REVISON OF ARTERIOVENOUS FISTULA - LEFT UPPER ARM;  Surgeon: Elam Dutch, MD;  Location: [place];  Service: Vascular;  Laterality: Left;  . REVISON OF ARTERIOVENOUS FISTULA Left 08/24/2017   Procedure: REVISON OF ARTERIOVENOUS FISTULA LEFT;  Surgeon: Conrad [place]illion, MD;  Location: [place];  Service: Vascular;  Laterality: Left;  . TEE WITHOUT CARDIOVERSION N/A 08/20/2017   Procedure: TRANSESOPHAGEAL ECHOCARDIOGRAM (TEE);  Surgeon: Evans Lance, MD;  Location: [place];  Service: Cardiovascular;  Laterality: N/A;  . THROMBECTOMY W/ EMBOLECTOMY Left 12/25/2014   Procedure: THROMBECTOMY ARTERIOVENOUS FISTULA;  Surgeon: Elam Dutch, MD;  Location: [place];  Service: Vascular;  Laterality: Left;  . THYROIDECTOMY  2011  . TOE AMPUTATION Bilateral     right 1st and  2nd digits; left 1st and 3rd digits"  . UMBILICAL HERNIA REPAIR  X 2  . UPPER EXTREMITY VENOGRAPHY Right 11/05/2017   Procedure: UPPER EXTREMITY VENOGRAPHY;  Surgeon: Conrad Markle, MD;  Location: [place] CV [facility];  Service: Cardiovascular;  Laterality: Right;  Marland Kitchen VASCULAR ACCESS DEVICE INSERTION Left 11/10/2017   Procedure: INSERTION OF SUPER HERO VASCULAR ACCESS DEVICE LEFT ARM;  Surgeon: Angelia Mould, MD;  Location: [place];  Service: Vascular;  Laterality: Left;  Marland Kitchen VEIN HARVEST Left 08/24/2017   Procedure: VEIN HARVEST;  Surgeon: Conrad Skillman, MD;  Location: [facility] OR;  Service: Vascular;  Laterality: Left;     Physical exam:  Vitals:   12/17/17 1248 12/17/17 1249  BP:  (!) 81/52  Pulse:  71  Resp: 18 18  Temp:  (!) 97 F (36.1 C)  TempSrc:  Oral  SpO2:  98%  Weight: 276 lb (125.2 kg) 276 lb (125.2 kg)  Height: 6\' 1"  (1.854 m) 6\' 1"  (1.854 m)    Left upper extremity: Audible bruit in graft  Left thigh 2 areas 2 cm in diameter ulcerated very friable tissue with a small amount of oozing no significant hematoma no purulent drainage no  erythema  Assessment: Ulcerated left thigh segment from previous femoral venous catheter.  I am not really sure why he continues to ooze from this and it has not healed.  I believe the best option at this point would be to excise the 2 ulcerations and resutured these.  Plan: Excision of 2 left thigh ulcers and closure on Tuesday, December 22, 2017.  Risk benefits possible complications and procedure details were discussed with the patient today and he agrees to proceed  [name], MD [facility] Office: [phone] Pager: [phone]

## 2017-12-18 DIAGNOSIS — M1712 Unilateral primary osteoarthritis, left knee: Secondary | ICD-10-CM | POA: Diagnosis not present

## 2017-12-18 DIAGNOSIS — Z4789 Encounter for other orthopedic aftercare: Secondary | ICD-10-CM | POA: Diagnosis not present

## 2017-12-21 ENCOUNTER — Other Ambulatory Visit: Payer: Self-pay

## 2017-12-21 ENCOUNTER — Encounter (HOSPITAL_COMMUNITY): Payer: Self-pay | Admitting: *Deleted

## 2017-12-21 ENCOUNTER — Telehealth: Payer: Self-pay | Admitting: Cardiovascular Disease

## 2017-12-21 LAB — ACID FAST CULTURE WITH REFLEXED SENSITIVITIES (MYCOBACTERIA)
Acid Fast Culture: NEGATIVE
Acid Fast Culture: NEGATIVE

## 2017-12-21 MED ORDER — CEFAZOLIN SODIUM 10 G IJ SOLR
3.0000 g | INTRAMUSCULAR | Status: AC
  Administered 2017-12-22: 3 g via INTRAVENOUS
  Filled 2017-12-21: qty 3

## 2017-12-21 NOTE — Progress Notes (Signed)
Pt denies any acute cardiopulmonary issues. See Dr. Stanford Breed cardiology note dated 12/14/17. Pt stated that his last dose of  Eliquis was " Friday." Pt made aware to stop taking vitamins, fish oil and herbal medications. Do not take any NSAIDs ie: Ibuprofen, Advil, Naproxen (Aleve), Motrin, BC and Goody Powder. Pt verbalized understanding of all pre-op instructions.

## 2017-12-21 NOTE — Telephone Encounter (Signed)
Noted  

## 2017-12-21 NOTE — Telephone Encounter (Signed)
Ok to hold apixaban 5 days prior to procedure and resume after [employer]

## 2017-12-21 NOTE — Telephone Encounter (Signed)
New Message:     Please disregard fax she just sent over concerning his device. Pt no longer has his device.

## 2017-12-21 NOTE — Telephone Encounter (Signed)
Routing to pharmacy.  Glendon Dunwoody C. Orelia Brandstetter, RN, ANP-C [facility] [address] [place], [place]  [address] [phone]  

## 2017-12-21 NOTE — Progress Notes (Signed)
   12/21/17 0900  OBSTRUCTIVE SLEEP APNEA  Have you ever been diagnosed with sleep apnea through a sleep study? No  Do you snore loudly (loud enough to be heard through closed doors)?  0  Do you often feel tired, fatigued, or sleepy during the daytime (such as falling asleep during driving or talking to someone)? 0  Has anyone observed you stop breathing during your sleep? 0  Do you have, or are you being treated for high blood pressure? 0  BMI more than 35 kg/m2? 1  Age > 50 (1-yes) 1  Neck circumference greater than:Male 16 inches or larger, Male 17inches or larger? 1  Male Gender (Yes=1) 1  Obstructive Sleep Apnea Score 4

## 2017-12-21 NOTE — Telephone Encounter (Signed)
-----   Message from Fidel Levy, RN sent at 12/21/2017  9:33 AM EDT ----- Regarding: RE: Eliquis hold for surgery Hi Wells Guiles! Do you mind creating a telephone call message for this, as our clearances are handled via our pre-op team/pool.   We need the following info in this format:  What type of surgery is being performed?   When is this surgery scheduled?   What type of clearance is required (medical clearance vs. Pharmacy clearance to hold med vs. Both)?  Are there any medications that need to be held prior to surgery and how long?   Practice name and name of physician performing surgery?   What is your office phone number What is your office fax number Anesthesia type (None, local, MAC, general) ?   This can be then routed to our triage pool or P CV DIV PREOP   _________________________________________________________________   (provider comments below)   ----- Message ----- From: [name], RN Sent: 12/21/2017   9:30 AM EDT To: Cv Div Nl Triage Subject: FW: Eliquis hold for surgery                     ----- Message ----- From: [name], RN Sent: 12/17/2017   1:28 PM EDT To: Cv Div Nl Triage Subject: Eliquis hold for surgery                       Requesting PHARMACY clearance and possible bridging if indicated. Excision left thigh wound/MAC anesthesia/ Dr. [name] 12-22-17 at [facility]. Will need to hold ELIQUIS for 2 days pre-op.  [facility] [place] [phone] fax [phone] Gomez Cleverly RN

## 2017-12-21 NOTE — Telephone Encounter (Signed)
Patient with diagnosis of Afib on Eliquis for anticoagulation.    Procedure: excision of left thigh wound Date of procedure: 12/22/17  CHADS2-VASc score of  2 (CHF, HTN, AGE, DM2, stroke/tia x 2, PAD, AGE, male)  ESRD - our protocol does not cover DOAC hold in the setting of ESRD. Will defer to physician for length of hold (though procedure is scheduled for tomorrow). Of note the patient was previously oked to hold Eliquis for 5 days for another excision.

## 2017-12-22 ENCOUNTER — Encounter (HOSPITAL_COMMUNITY): Admission: RE | Payer: Self-pay | Source: Ambulatory Visit

## 2017-12-22 ENCOUNTER — Ambulatory Visit (HOSPITAL_COMMUNITY)
Admission: RE | Admit: 2017-12-22 | Discharge: 2017-12-22 | Disposition: A | Discharge status: Home / Self Care | Payer: Medicare HMO | Source: Ambulatory Visit | Attending: Vascular Surgery | Admitting: Vascular Surgery

## 2017-12-22 ENCOUNTER — Ambulatory Visit (HOSPITAL_COMMUNITY): Payer: Medicare HMO | Admitting: Certified Registered"

## 2017-12-22 ENCOUNTER — Telehealth: Payer: Self-pay | Admitting: Vascular Surgery

## 2017-12-22 ENCOUNTER — Encounter (HOSPITAL_COMMUNITY)
Admission: RE | Disposition: A | Discharge status: Home / Self Care | Payer: Self-pay | Source: Ambulatory Visit | Attending: Vascular Surgery

## 2017-12-22 ENCOUNTER — Ambulatory Visit (HOSPITAL_COMMUNITY): Admission: RE | Admit: 2017-12-22 | Payer: Medicare HMO | Source: Ambulatory Visit | Admitting: Gastroenterology

## 2017-12-22 ENCOUNTER — Encounter (HOSPITAL_COMMUNITY): Payer: Self-pay | Admitting: Certified Registered"

## 2017-12-22 DIAGNOSIS — N186 End stage renal disease: Secondary | ICD-10-CM | POA: Insufficient documentation

## 2017-12-22 DIAGNOSIS — E039 Hypothyroidism, unspecified: Secondary | ICD-10-CM | POA: Diagnosis not present

## 2017-12-22 DIAGNOSIS — I120 Hypertensive chronic kidney disease with stage 5 chronic kidney disease or end stage renal disease: Secondary | ICD-10-CM | POA: Diagnosis not present

## 2017-12-22 DIAGNOSIS — I96 Gangrene, not elsewhere classified: Secondary | ICD-10-CM | POA: Diagnosis not present

## 2017-12-22 DIAGNOSIS — I4891 Unspecified atrial fibrillation: Secondary | ICD-10-CM | POA: Diagnosis not present

## 2017-12-22 DIAGNOSIS — Z79899 Other long term (current) drug therapy: Secondary | ICD-10-CM | POA: Insufficient documentation

## 2017-12-22 DIAGNOSIS — L988 Other specified disorders of the skin and subcutaneous tissue: Secondary | ICD-10-CM | POA: Diagnosis present

## 2017-12-22 DIAGNOSIS — L97928 Non-pressure chronic ulcer of unspecified part of left lower leg with other specified severity: Secondary | ICD-10-CM | POA: Diagnosis not present

## 2017-12-22 DIAGNOSIS — Z992 Dependence on renal dialysis: Secondary | ICD-10-CM | POA: Diagnosis not present

## 2017-12-22 DIAGNOSIS — Z7902 Long term (current) use of antithrombotics/antiplatelets: Secondary | ICD-10-CM | POA: Insufficient documentation

## 2017-12-22 DIAGNOSIS — L97128 Non-pressure chronic ulcer of left thigh with other specified severity: Secondary | ICD-10-CM | POA: Diagnosis not present

## 2017-12-22 DIAGNOSIS — Z95810 Presence of automatic (implantable) cardiac defibrillator: Secondary | ICD-10-CM | POA: Insufficient documentation

## 2017-12-22 DIAGNOSIS — M6289 Other specified disorders of muscle: Secondary | ICD-10-CM | POA: Diagnosis not present

## 2017-12-22 HISTORY — PX: WOUND DEBRIDEMENT: SHX247

## 2017-12-22 HISTORY — DX: Other complications of procedures, not elsewhere classified, initial encounter: T81.89XA

## 2017-12-22 HISTORY — DX: Anemia, unspecified: D64.9

## 2017-12-22 LAB — CBC
HCT: 28.7 % — ABNORMAL LOW (ref 39.0–52.0)
Hemoglobin: 8.7 g/dL — ABNORMAL LOW (ref 13.0–17.0)
MCH: 27.4 pg (ref 26.0–34.0)
MCHC: 30.3 g/dL (ref 30.0–36.0)
MCV: 90.3 fL (ref 78.0–100.0)
PLATELETS: 117 10*3/uL — AB (ref 150–400)
RBC: 3.18 MIL/uL — ABNORMAL LOW (ref 4.22–5.81)
RDW: 21 % — ABNORMAL HIGH (ref 11.5–15.5)
WBC: 5.4 10*3/uL (ref 4.0–10.5)

## 2017-12-22 LAB — ELECTROLYTE PANEL
Anion gap: 12 (ref 5–15)
CHLORIDE: 95 mmol/L — AB (ref 98–111)
CO2: 28 mmol/L (ref 22–32)
POTASSIUM: 3.7 mmol/L (ref 3.5–5.1)
SODIUM: 135 mmol/L (ref 135–145)

## 2017-12-22 LAB — PROTIME-INR
INR: 1.32
PROTHROMBIN TIME: 16.2 s — AB (ref 11.4–15.2)

## 2017-12-22 SURGERY — DEBRIDEMENT, WOUND
Anesthesia: Monitor Anesthesia Care | Laterality: Left

## 2017-12-22 SURGERY — COLONOSCOPY
Anesthesia: Monitor Anesthesia Care

## 2017-12-22 MED ORDER — PHENYLEPHRINE 40 MCG/ML (10ML) SYRINGE FOR IV PUSH (FOR BLOOD PRESSURE SUPPORT)
PREFILLED_SYRINGE | INTRAVENOUS | Status: DC | PRN
  Administered 2017-12-22: 80 ug via INTRAVENOUS
  Administered 2017-12-22: 120 ug via INTRAVENOUS
  Administered 2017-12-22: 80 ug via INTRAVENOUS
  Administered 2017-12-22: 120 ug via INTRAVENOUS

## 2017-12-22 MED ORDER — CELECOXIB 200 MG PO CAPS: 200.0000 mg | ORAL_CAPSULE | ORAL | Status: DC

## 2017-12-22 MED ORDER — PROMETHAZINE HCL 25 MG/ML IJ SOLN: 6.2500 mg | INTRAMUSCULAR | Status: DC | PRN

## 2017-12-22 MED ORDER — BUPIVACAINE HCL (PF) 0.25 % IJ SOLN
INTRAMUSCULAR | Status: AC
  Filled 2017-12-22: qty 30

## 2017-12-22 MED ORDER — MEPERIDINE HCL 50 MG/ML IJ SOLN: 6.2500 mg | INTRAMUSCULAR | Status: DC | PRN

## 2017-12-22 MED ORDER — CHLORHEXIDINE GLUCONATE CLOTH 2 % EX PADS: 6.0000 | MEDICATED_PAD | Freq: Once | CUTANEOUS | Status: DC

## 2017-12-22 MED ORDER — CHLORHEXIDINE GLUCONATE 4 % EX LIQD: 60.0000 mL | Freq: Once | CUTANEOUS | Status: DC

## 2017-12-22 MED ORDER — HYDROCODONE-ACETAMINOPHEN 7.5-325 MG PO TABS
1.0000 | ORAL_TABLET | Freq: Once | ORAL | Status: AC | PRN
  Administered 2017-12-22: 1 via ORAL

## 2017-12-22 MED ORDER — TRAMADOL HCL 50 MG PO TABS: 50.0000 mg | ORAL_TABLET | Freq: Four times a day (QID) | ORAL | 0 refills | Status: DC | PRN

## 2017-12-22 MED ORDER — MIDAZOLAM HCL 2 MG/2ML IJ SOLN
INTRAMUSCULAR | Status: DC | PRN
  Administered 2017-12-22 (×2): 1 mg via INTRAVENOUS

## 2017-12-22 MED ORDER — GABAPENTIN 300 MG PO CAPS: 300.0000 mg | ORAL_CAPSULE | ORAL | Status: DC

## 2017-12-22 MED ORDER — MIDAZOLAM HCL 2 MG/2ML IJ SOLN
INTRAMUSCULAR | Status: AC
  Filled 2017-12-22: qty 2

## 2017-12-22 MED ORDER — HYDROMORPHONE HCL 1 MG/ML IJ SOLN
0.2500 mg | INTRAMUSCULAR | Status: DC | PRN
  Administered 2017-12-22 (×2): 0.5 mg via INTRAVENOUS

## 2017-12-22 MED ORDER — PROPOFOL 10 MG/ML IV BOLUS
INTRAVENOUS | Status: DC | PRN
  Administered 2017-12-22 (×2): 20 mg via INTRAVENOUS

## 2017-12-22 MED ORDER — CEFAZOLIN SODIUM-DEXTROSE 2-4 GM/100ML-% IV SOLN
INTRAVENOUS | Status: AC
  Filled 2017-12-22: qty 100

## 2017-12-22 MED ORDER — FENTANYL CITRATE (PF) 250 MCG/5ML IJ SOLN
INTRAMUSCULAR | Status: AC
  Filled 2017-12-22: qty 5

## 2017-12-22 MED ORDER — BUPIVACAINE HCL 0.25 % IJ SOLN
INTRAMUSCULAR | Status: DC | PRN
  Administered 2017-12-22: 30 mL

## 2017-12-22 MED ORDER — SODIUM CHLORIDE 0.9 % IV SOLN
INTRAVENOUS | Status: DC
  Administered 2017-12-22: 09:00:00 via INTRAVENOUS

## 2017-12-22 MED ORDER — ALBUMIN HUMAN 5 % IV SOLN
12.5000 g | Freq: Once | INTRAVENOUS | Status: AC
  Administered 2017-12-22: 12.5 g via INTRAVENOUS

## 2017-12-22 MED ORDER — ACETAMINOPHEN 500 MG PO TABS: 1000.0000 mg | ORAL_TABLET | ORAL | Status: DC

## 2017-12-22 MED ORDER — ONDANSETRON HCL 4 MG/2ML IJ SOLN
INTRAMUSCULAR | Status: AC
  Filled 2017-12-22: qty 2

## 2017-12-22 MED ORDER — FENTANYL CITRATE (PF) 250 MCG/5ML IJ SOLN
INTRAMUSCULAR | Status: DC | PRN
  Administered 2017-12-22: 25 ug via INTRAVENOUS

## 2017-12-22 MED ORDER — HYDROMORPHONE HCL 1 MG/ML IJ SOLN
INTRAMUSCULAR | Status: AC
  Filled 2017-12-22: qty 1

## 2017-12-22 MED ORDER — PROPOFOL 500 MG/50ML IV EMUL
INTRAVENOUS | Status: DC | PRN
  Administered 2017-12-22: 100 ug/kg/min via INTRAVENOUS

## 2017-12-22 MED ORDER — 0.9 % SODIUM CHLORIDE (POUR BTL) OPTIME
TOPICAL | Status: DC | PRN
  Administered 2017-12-22: 3000 mL
  Administered 2017-12-22: 1000 mL

## 2017-12-22 MED ORDER — LIDOCAINE-EPINEPHRINE (PF) 1 %-1:200000 IJ SOLN
INTRAMUSCULAR | Status: DC | PRN
  Administered 2017-12-22: 30 mL

## 2017-12-22 MED ORDER — ONDANSETRON HCL 4 MG/2ML IJ SOLN
INTRAMUSCULAR | Status: DC | PRN
  Administered 2017-12-22: 4 mg via INTRAVENOUS

## 2017-12-22 MED ORDER — ALBUMIN HUMAN 5 % IV SOLN
INTRAVENOUS | Status: AC
  Filled 2017-12-22: qty 250

## 2017-12-22 MED ORDER — HYDROCODONE-ACETAMINOPHEN 7.5-325 MG PO TABS
ORAL_TABLET | ORAL | Status: DC
Start: 2017-12-22 — End: 2017-12-22
  Filled 2017-12-22: qty 1

## 2017-12-22 MED ORDER — ACETAMINOPHEN 10 MG/ML IV SOLN: 1000.0000 mg | Freq: Once | INTRAVENOUS | Status: DC | PRN

## 2017-12-22 MED ORDER — FENTANYL CITRATE (PF) 100 MCG/2ML IJ SOLN: 25.0000 ug | INTRAMUSCULAR | Status: DC | PRN

## 2017-12-22 SURGICAL SUPPLY — 31 items
BANDAGE ACE 4X5 VEL STRL LF (GAUZE/BANDAGES/DRESSINGS) IMPLANT
BANDAGE ACE 6X5 VEL STRL LF (GAUZE/BANDAGES/DRESSINGS) IMPLANT
BNDG GAUZE ELAST 4 BULKY (GAUZE/BANDAGES/DRESSINGS) IMPLANT
CANISTER SUCT 3000ML PPV (MISCELLANEOUS) ×2 IMPLANT
CONT SPEC 4OZ CLIKSEAL STRL BL (MISCELLANEOUS) ×2 IMPLANT
COVER SURGICAL LIGHT HANDLE (MISCELLANEOUS) ×2 IMPLANT
DECANTER SPIKE VIAL GLASS SM (MISCELLANEOUS) ×2 IMPLANT
DRSG COVADERM 4X8 (GAUZE/BANDAGES/DRESSINGS) ×2 IMPLANT
ELECT REM PT RETURN 9FT ADLT (ELECTROSURGICAL) ×2
ELECTRODE REM PT RTRN 9FT ADLT (ELECTROSURGICAL) ×1 IMPLANT
GAUZE SPONGE 4X4 12PLY STRL (GAUZE/BANDAGES/DRESSINGS) ×2 IMPLANT
GLOVE BIO SURGEON STRL SZ7.5 (GLOVE) ×2 IMPLANT
GOWN STRL REUS W/ TWL LRG LVL3 (GOWN DISPOSABLE) ×3 IMPLANT
GOWN STRL REUS W/TWL LRG LVL3 (GOWN DISPOSABLE) ×3
HANDPIECE INTERPULSE COAX TIP (DISPOSABLE) ×1
KIT BASIN OR (CUSTOM PROCEDURE TRAY) ×2 IMPLANT
KIT TURNOVER KIT B (KITS) ×2 IMPLANT
NS IRRIG 1000ML POUR BTL (IV SOLUTION) ×2 IMPLANT
PACK GENERAL/GYN (CUSTOM PROCEDURE TRAY) IMPLANT
PACK UNIVERSAL I (CUSTOM PROCEDURE TRAY) IMPLANT
PAD ARMBOARD 7.5X6 YLW CONV (MISCELLANEOUS) ×4 IMPLANT
SET HNDPC FAN SPRY TIP SCT (DISPOSABLE) ×1 IMPLANT
STAPLER VISISTAT 35W (STAPLE) IMPLANT
SUT ETHILON 3 0 PS 1 (SUTURE) ×2 IMPLANT
SUT VIC AB 2-0 CTX 36 (SUTURE) IMPLANT
SUT VIC AB 3-0 SH 27 (SUTURE) ×1
SUT VIC AB 3-0 SH 27X BRD (SUTURE) ×1 IMPLANT
SUT VICRYL 4-0 PS2 18IN ABS (SUTURE) IMPLANT
SYR CONTROL 10ML LL (SYRINGE) ×2 IMPLANT
TOWEL GREEN STERILE (TOWEL DISPOSABLE) ×2 IMPLANT
WATER STERILE IRR 1000ML POUR (IV SOLUTION) ×2 IMPLANT

## 2017-12-22 NOTE — Telephone Encounter (Signed)
sch appt apk to pt 01/07/18 1pm sutrue removal p/o PA

## 2017-12-22 NOTE — Op Note (Signed)
Procedure: Excisional biopsy of chronic sinus tract left thigh  Preoperative diagnosis: Chronic draining sinus left thigh  Postoperative diagnosis: Same  Anesthesia: Local with IV sedation  Assistant: Leontine Locket, PA-C  Specimens: Left thigh mass  Operative findings: #1 thickened fibrotic adipose tissue  Operative details: After obtaining informed consent, patient taken the operating.  The patient was placed supine position operating table.  After adequate sedation patient's entire left anterior thigh was prepped and draped in usual sterile fashion.  Local anesthesia was infiltrated over the anterior thigh.  On the elliptical incision was made incorporating to chronically draining sinus tracts in the left anterior thigh.  Incision was about 7 cm x 4 cm.  Incision was carried out the subcu cutaneous tissues all the way to the level fascia.  A firm mass was completely excised.  This was sent to pathology as specimen.  It appeared to be an inflammatory mass.  Next the wound was thoroughly irrigated with normal saline solution.  Hemostasis was obtained.  Subcutaneous tissues were reapproximated using a running 3-0 Vicryl suture.  Skin was closed with interrupted vertical mattress and simple nylon sutures.  Patient tolerated procedure well and there were no complications.  Insert sponge needle counts correct the end of the case.  Patient was taken the recovery room in stable condition.  [name], MD [facility] [place] Office: [phone] Pager: [phone]

## 2017-12-22 NOTE — Anesthesia Procedure Notes (Signed)
Procedure Name: MAC Date/Time: 12/22/2017 10:40 AM Performed by: Imagene Riches, CRNA Pre-anesthesia Checklist: Patient identified, Emergency Drugs available, Suction available and Patient being monitored Patient Re-evaluated:Patient Re-evaluated prior to induction Oxygen Delivery Method: Simple face mask Preoxygenation: Pre-oxygenation with 100% oxygen

## 2017-12-22 NOTE — Anesthesia Preprocedure Evaluation (Addendum)
Anesthesia Evaluation  Patient identified by MRN, date of birth, ID band Patient awake    Reviewed: Allergy & Precautions, NPO status , Patient's Chart, lab work & pertinent test results  History of Anesthesia Complications (+) PONV and DIFFICULT IV STICK / SPECIAL LINE  Airway Mallampati: I  TM Distance: >3 FB Neck ROM: Full    Dental  (+) Edentulous Upper, Dental Advisory Given   Pulmonary neg pulmonary ROS,    breath sounds clear to auscultation       Cardiovascular hypertension, Pt. on medications + Peripheral Vascular Disease and +CHF (pulmonary HTN)  + dysrhythmias Atrial Fibrillation + Cardiac Defibrillator  Rhythm:Regular Rate:Normal + Systolic murmurs 3/64 ECHO: EF 60-65%, mod AS with peak grad 40 mmHg, mean grad 25 mmHg, mod MS with peak grad 9 mmHg, mod TR, pulm HTN with PA pressure 19m Hg   Neuro/Psych negative neurological ROS     GI/Hepatic GERD  Medicated,  Endo/Other  Hypothyroidism Obesity  Renal/GU ESRF and DialysisRenal diseaseS/p renal transplant     Musculoskeletal  (+) Arthritis ,   Abdominal (+) + obese,   Peds  Hematology  (+) anemia , coumadin   Anesthesia Other Findings   Reproductive/Obstetrics                             Lab Results  Component Value Date   WBC 5.4 12/22/2017   HGB 8.7 (L) 12/22/2017   HCT 28.7 (L) 12/22/2017   MCV 90.3 12/22/2017   PLT 117 (L) 12/22/2017   Lab Results  Component Value Date   CREATININE 4.00 (H) 12/16/2017   BUN 10 12/16/2017   NA 135 12/22/2017   K 3.7 12/22/2017   CL 95 (L) 12/22/2017   CO2 28 12/22/2017   Lab Results  Component Value Date   INR 1.29 12/12/2017   INR 1.73 11/10/2017   INR 1.85 11/09/2017   Echo: - Left ventricle: The cavity size was normal. Wall thickness was   increased in a pattern of mild LVH. Systolic function was normal.   The estimated ejection fraction was in the range of 60% to  65%.   Wall motion was normal; there were no regional wall motion   abnormalities. Doppler parameters are consistent with abnormal   left ventricular relaxation (grade 1 diastolic dysfunction). The   E/e&' ratio is between 8-15, suggesting indeterminate LV filling   pressure. - Ventricular septum: Septal motion showed abnormal function and dyssynergy. The contour showed diastolic flattening and systolic flattening. - Aortic valve: Moderate calcified aortic stenosis. Mean gradient (S): 25 mm Hg. Peak gradient (S): 40 mm Hg. Valve area (VTI): 1.15 cm^2. Valve area (Vmax): 1.11 cm^2. Valve area (Vmean): 1.16   cm^2. - Mitral valve: Calcified annulus. Mildly thickened leaflets . Mild to moderate stenosis. Mean gradient (D): 8 mm Hg. Valve area by continuity equation (using LVOT flow): 1.25 cm^2. - Left atrium: The atrium was mildly dilated. - Right ventricle: The cavity size was moderately dilated. - Right atrium: Severely dilated. - Tricuspid valve: There was moderate regurgitation. - Pulmonary arteries: PA peak pressure: 59 mm Hg (S). - Inferior vena cava: The vessel was dilated. The respirophasic   diameter changes were blunted (< 50%), consistent with elevated   central venous pressure.   Anesthesia Physical  Anesthesia Plan  ASA: III  Anesthesia Plan: MAC   Post-op Pain Management:    Induction: Intravenous  PONV Risk Score and Plan: 2 and Ondansetron  and Propofol infusion  Airway Management Planned: Natural Airway and Simple Face Mask  Additional Equipment: None  Intra-op Plan:   Post-operative Plan:   Informed Consent: I have reviewed the patients History and Physical, chart, labs and discussed the procedure including the risks, benefits and alternatives for the proposed anesthesia with the patient or authorized representative who has indicated his/her understanding and acceptance.   Dental advisory given  Plan Discussed with: CRNA and Anesthesiologist  Anesthesia  Plan Comments:        Anesthesia Quick Evaluation

## 2017-12-22 NOTE — Progress Notes (Signed)
Dr. Rosendo Gros medications released for surgery scheduled on 8/20. Discontinued medications and reordered DOS.

## 2017-12-22 NOTE — Transfer of Care (Signed)
Immediate Anesthesia Transfer of Care Note  Patient: Adam [name].  Procedure(s) Performed: EXCISION OF LEFT THIGH WOUND (Left )  Patient Location: PACU  Anesthesia Type:MAC  Level of Consciousness: awake, alert  and oriented  Airway & Oxygen Therapy: Patient Spontanous Breathing and Patient connected to face mask oxygen  Post-op Assessment: Report given to RN and Post -op Vital signs reviewed and stable  Post vital signs: Reviewed and stable  Last Vitals:  Vitals Value Taken Time  BP 76/56 12/22/2017 11:24 AM  Temp    Pulse 69 12/22/2017 11:24 AM  Resp 8 12/22/2017 11:24 AM  SpO2 99 % 12/22/2017 11:24 AM  Vitals shown include unvalidated device data.  Last Pain:  Vitals:   12/22/17 0819  TempSrc: Oral         Complications: No apparent anesthesia complications and BP soft in PACU. Dr. Ola Spurr aware. Orders obtained.

## 2017-12-22 NOTE — Interval H&P Note (Signed)
History and Physical Interval Note:  12/22/2017 9:42 AM  Adam Alm.  has presented today for surgery, with the diagnosis of NON-HEALING WOUND LEFT THIGH  The various methods of treatment have been discussed with the patient and family. After consideration of risks, benefits and other options for treatment, the patient has consented to  Procedure(s): EXCISION OF LEFT THIGH WOUND (Left) as a surgical intervention .  The patient's history has been reviewed, patient examined, no change in status, stable for surgery.  I have reviewed the patient's chart and labs.  Questions were answered to the patient's satisfaction.     [name]

## 2017-12-22 NOTE — Discharge Instructions (Signed)

## 2017-12-22 NOTE — Anesthesia Postprocedure Evaluation (Signed)
Anesthesia Post Note  Patient: Isayah Ignasiak.  Procedure(s) Performed: EXCISION OF LEFT THIGH WOUND (Left )     Patient location during evaluation: PACU Anesthesia Type: MAC Level of consciousness: awake and alert Pain management: pain level controlled Vital Signs Assessment: post-procedure vital signs reviewed and stable Respiratory status: spontaneous breathing, nonlabored ventilation, respiratory function stable and patient connected to nasal cannula oxygen Cardiovascular status: stable and blood pressure returned to baseline Postop Assessment: no apparent nausea or vomiting Anesthetic complications: no    Last Vitals:  Vitals:   12/22/17 1307 12/22/17 1323  BP: (!) 84/63 (!) 82/63  Pulse: 66 65  Resp: 16 16  Temp:  (!) 36.4 C  SpO2: 94% 99%    Last Pain:  Vitals:   12/22/17 1330  TempSrc:   PainSc: 0-No pain                 [name]

## 2017-12-23 ENCOUNTER — Encounter (HOSPITAL_COMMUNITY): Payer: Self-pay | Admitting: Vascular Surgery

## 2017-12-25 ENCOUNTER — Encounter (HOSPITAL_COMMUNITY): Payer: Self-pay | Admitting: *Deleted

## 2017-12-25 ENCOUNTER — Other Ambulatory Visit: Payer: Self-pay

## 2017-12-25 NOTE — Progress Notes (Signed)
Anesthesia Chart Review: SAME DAY WORK-UP   Case:  [id number] Date/Time:  12/29/17 1400   Procedure:  EXCISION MASSES (N/A ) - LOCAL   Anesthesia type:  Monitor Anesthesia Care   Pre-op diagnosis:  multiple masses   Location:  [place] OR ROOM 02 / [place] OR   Surgeon:  Ralene Ok, MD      DISCUSSION: Patient is a 57 year old male scheduled for the above procedure.   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; hemodialysis MWF, was doing home dialysis; not a candidate for re-kidney transplant at [facility] as of 02/25/16), pulmonary hypertension (on tadaladfil, ambrisentan), mild-moderate mitral stenosis 08/2017, moderate AS 08/2017, afib (on Eliquis, amiodarone), non-ischemic cardiomyopathy (diagnosed in [place]), Medtronic Evera XT dual chamber ICD '09 (generator change 06/16/14; s/p ICD extraction 08/21/17 due to bacteremia, Life Vest prescribed; discontinued 10/21/17 per EP), thyroid cancer (s/p thyroidectomy '11; acquired hypothyroidism), anemia, left great toe amputation 08/01/14, sinus surgery '11 (near complete nasal septal perforation by ENT Dr. Wilburn Cornelia notes 09/15/16). He is on midodrine for hypotension.   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  -  11/10/17: Removal of left IJ tunneled dialysis catheter, placement of HeRO   graft with AcuSeal graft, ligation of first stage right brachiobrachial AVF  -  11/06/17: Open arthrotomy and drainage right knee with synovectomy (for   septic arthritis)  -  11/05/17: Right arm fistulogram, left central venogram (findings: occluded   bilateral central venous systems, right central venous system drains via   hypertrophied neck veins;  occluded left subclavian vein and axillary vein   stents, left IF vein tunneled catheter in place) - 10/13/17: Right first stage brachial vein transposition (brachiobrachial AVF)  - 09/23/17-09/26/17: Admission for near syncope with anemia (hgb 6.6). S/p 1 Unit PRBC.   -  09/25/17: EGD for acute post hemorrhagic anemia (LA grade C esophagitis,   non-bleeding esophageal ulcers) - 09/01/17: Removal of left femoral tunneled dialysis catheter, placement of left IF vein tunnels dialysis catheter - 08/17/17-08/28/17: Admission for sepsis secondary to MSSA bacteremia in the setting of AICD and AVF.   -  08/24/17: I&D of LUE abscess, excision of distal half of left brachiocephalic   AVF with stent included, VPA of left brachial artery, placement of left   femoral tunneled dialysis catheter  -  08/21/17: Extraction of dual-chamber ICD system (for staph aures sepsis)   Last Eliquis dose 12/23/17. Labs and anesthesiologist evaluation on the day of surgery.   PROVIDERS: - PCP is with [facility]. Listed as [name], MD.  - Endocrinologist is Renato Shin, MD. - Cardiologist is Alyson Locket, MD. Last visit on 12/03/17 with Rosaria Ferries, PA-C. (He had also seen Minus Breeding, MD and Quay Burow, MD after moving from [place] in 2016). According to cardiology notes from [facility] (scanned under Media tab, myocardial biopsy was negative for amyloid '12 and had cardiac radiofrequency ablation '11 and gastric banding '06.  - EP cardiologist is Cristopher Peru, MD. Last visit 10/21/17. He ordered a follow-up echo which has not been done yet; however, he did discontinue the Life Vest given patient's EF 08/2017 was normalized. Next visit scheduled for 08/17/18. There are not plans to re-insert an ICD at this time  given improved EF and high risk for re-infection.  - Pulmonologist is Dr. Simonne Maffucci, MD. Last visit 12/02/17. Objectively, he felt patient's pulmonary hypertension actually looked a little  better on 08/2017 echo. A repeat echo in planned in 02/2018. No medication changes were made. He also saw Alisia Ferrari at the [facility] on 07/17/15 ([place]). - Nephrologist is Pearson Grippe, MD. He was declined by [facility] team due to history of hypotension and pulmonary hypertension.    LABS: Pending surgery date.   IMAGES: 1V CXR 11/10/17: IMPRESSION: 1. No change in position of left dialysis catheter with tip overlying the expected SVC-RA junction. 2. Stable mild cardiomegaly.  No active lung disease.   EKG: 12/12/17: NSR, rightward axis, low voltage QRS, possible inferior infarct (age undetermined). Cannot rule out anteroseptal infarct (age undetermined).    CV: Echo 08/18/17: Study Conclusions - Left ventricle: The cavity size was normal. Wall thickness was   increased in a pattern of mild LVH. Systolic function was normal.   The estimated ejection fraction was in the range of 60% to 65%.   Wall motion was normal; there were no regional wall motion   abnormalities. Doppler parameters are consistent with abnormal   left ventricular relaxation (grade 1 diastolic dysfunction). The   E/e&' ratio is between 8-15, suggesting indeterminate LV filling   pressure. - Ventricular septum: Septal motion showed abnormal function and   dyssynergy. The contour showed diastolic flattening and systolic   flattening. - Aortic valve: Moderate calcified aortic stenosis. Mean gradient   (S): 25 mm Hg. Peak gradient (S): 40 mm Hg. Valve area (VTI):   1.15 cm^2. Valve area (Vmax): 1.11 cm^2. Valve area (Vmean): 1.16   cm^2. - Mitral valve: Calcified annulus. Mildly thickened leaflets . Mild   to moderate stenosis. Mean gradient (D): 8 mm Hg. Valve area by   continuity equation (using LVOT flow): 1.25 cm^2. - Left atrium: The atrium was mildly dilated. - Right ventricle: The cavity size was moderately dilated. - Right atrium: Severely dilated. -  Tricuspid valve: There was moderate regurgitation. - Pulmonary arteries: PA peak pressure: 59 mm Hg (S). - Inferior vena cava: The vessel was dilated. The respirophasic   diameter changes were blunted (< 50%), consistent with elevated   central venous pressure.   [place] 08/15/15 ([place]; Care Everywhere): Component Name Value Ref Range  Cardiac Index (l/min/m2) 3.7 L/min/m2  Right Atrium Mean Pressure (mmHg) 5 mmHg   Right Ventricle Systolic Pressure (mmHg) 46 mmHg   Pulmonary Artery Mean Pressure (mmHg) 31 mmHg   Pulmonary Wedge Pressure (mmHg) 11 mmHg   Pulmonary Vascular Resistance (Wood units) 2.2 Wood units    Carotid U/S 05/10/15: Impression: Heterogeneous calcified plaque shadowing, bilaterally. 1-39% right ICA stenosis, higher velocities could be of secured by shadowing. 40-59% left ICA stenosis, higher velocities could be at her by shadowing. 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 Normal subclavian arteries bilaterally. Patent vertebral arteries with antegrade flow.    Past Medical History:  Diagnosis Date  . AICD (automatic cardioverter/defibrillator) present    Medtronic   . Anemia   . Aortic stenosis    moderate AS by 08/2016 echo  . Arthritis    "knees" (08/05/2016)  . Atrial fibrillation ([employer])   . ESRD (end stage renal disease) on dialysis ([place])    "M, T, W, T, F; NX stage hemodialysis; I do it at home" (08/05/2016)  . Great toe amputation status ([place])    status  post left hallux amputation 08/01/14  . History of blood transfusion 2009   "S/P biopsy for prostate cancer check"  . Hypertension   . Hypothyroidism (acquired)   . Mitral stenosis    moderate mitral stenosis  . Nonhealing surgical wound    left thigh  . Nonischemic cardiomyopathy ([place])   . Pneumonia 2014; 08/05/2016  . PONV (postoperative nausea and vomiting)   . Pulmonary HTN ([place])   . Renal insufficiency   . Thyroid cancer ([place]) 2011    Past Surgical History:  Procedure Laterality Date  .  ARTERIAL LINE INSERTION Right 08/24/2017   Procedure: ARTERIAL LINE INSERTION INTO RIGHT FEMORAL ARTERY;  Surgeon: Conrad [place], MD;  Location: [place];  Service: Vascular;  Laterality: Right;  . BASCILIC VEIN TRANSPOSITION Right 10/13/2017   Procedure: FIRST STAGE BRACHIAL VEIN TRANSPOSITION RIGHT ARM;  Surgeon: Conrad [place], MD;  Location: [place];  Service: Vascular;  Laterality: Right;  . BIOPSY  09/25/2017   Procedure: BIOPSY;  Surgeon: Ronnette Juniper, MD;  Location: [facility] ENDOSCOPY;  Service: Gastroenterology;;  . CARDIAC CATHETERIZATION Right 2017  . CARDIAC DEFIBRILLATOR PLACEMENT  2009  . DIALYSIS FISTULA CREATION Left 2003   "upper arm"  . DIALYSIS/PERMA CATHETER REMOVAL Left 09/01/2017   Procedure: DIALYSIS/PERMA CATHETER REMOVAL;  Surgeon: Conrad Peru, MD;  Location: [place];  Service: Vascular;  Laterality: Left;  . ESOPHAGOGASTRODUODENOSCOPY (EGD) WITH PROPOFOL N/A 09/25/2017   Procedure: ESOPHAGOGASTRODUODENOSCOPY (EGD) WITH PROPOFOL;  Surgeon: Ronnette Juniper, MD;  Location: [place];  Service: Gastroenterology;  Laterality: N/A;  . HERNIA REPAIR    . I&D EXTREMITY Right 11/06/2017   Procedure: IRRIGATION AND DEBRIDEMENT KNEE;  Surgeon: Rod Can, MD;  Location: [place];  Service: Orthopedics;  Laterality: Right;  . ICD LEAD REMOVAL Right 08/20/2017   Procedure: ICD EXTRACTION WITH C-ARM;  Surgeon: Evans Lance, MD;  Location[facility] OR;  Service: Cardiovascular;  Laterality: Right;  DR VAN TRIGT TO BACK-UP  . icd removed    . INSERTION OF DIALYSIS CATHETER N/A 11/11/2016   Procedure: INSERTION OF TUNNELED DIALYSIS CATHETER;  Surgeon: Angelia Mould, MD;  Location: [place];  Service: Vascular;  Laterality: N/A;  . INSERTION OF DIALYSIS CATHETER Left 08/24/2017   Procedure: INSERTION OF DIALYSIS CATHETER;  Surgeon: Conrad [place], MD;  Location: [place];  Service: Vascular;  Laterality: Left;  . INSERTION OF DIALYSIS CATHETER Left 09/01/2017   Procedure: INSERTION OF DIALYSIS CATHETER;   Surgeon: Conrad [place], MD;  Location: [place];  Service: Vascular;  Laterality: Left;  . KIDNEY TRANSPLANT  2009  . KNEE SURGERY Bilateral 2000s   "drained fluid"  . LAPAROSCOPIC GASTRIC BANDING  2006  . LIGATION OF ARTERIOVENOUS  FISTULA Right 11/10/2017   Procedure: LIGATION OF BRACHIAL VEIN TRANSPOSITION RIGHT ARM;  Surgeon: Angelia Mould, MD;  Location: [place];  Service: Vascular;  Laterality: Right;  . PROSTATE BIOPSY  2009  . REVISON OF ARTERIOVENOUS FISTULA Left 2/29/7989   Procedure: PLICATION OF ARTERIOVENOUS FISTULA ANEURYSM;  Surgeon: Elam Dutch, MD;  Location: [place];  Service: Vascular;  Laterality: Left;  . REVISON OF ARTERIOVENOUS FISTULA Left 11/10/2016   Procedure: REVISON OF ARTERIOVENOUS FISTULA - LEFT UPPER ARM;  Surgeon: Elam Dutch, MD;  Location: [place];  Service: Vascular;  Laterality: Left;  . REVISON OF ARTERIOVENOUS FISTULA Left 08/24/2017   Procedure: REVISON OF ARTERIOVENOUS FISTULA LEFT;  Surgeon: Conrad [place], MD;  Location: [place];  Service: Vascular;  Laterality: Left;  .  TEE WITHOUT CARDIOVERSION N/A 08/20/2017   Procedure: TRANSESOPHAGEAL ECHOCARDIOGRAM (TEE);  Surgeon: Evans Lance, MD;  Location: [place];  Service: Cardiovascular;  Laterality: N/A;  . THROMBECTOMY W/ EMBOLECTOMY Left 12/25/2014   Procedure: THROMBECTOMY ARTERIOVENOUS FISTULA;  Surgeon: Elam Dutch, MD;  Location: [place];  Service: Vascular;  Laterality: Left;  . THYROIDECTOMY  2011  . TOE AMPUTATION Bilateral     right 1st and  2nd digits; left 1st and 3rd digits"  . UMBILICAL HERNIA REPAIR  X 2  . UPPER EXTREMITY VENOGRAPHY Right 11/05/2017   Procedure: UPPER EXTREMITY VENOGRAPHY;  Surgeon: Conrad [place], MD;  Location: [place] CV LAB;  Service: Cardiovascular;  Laterality: Right;  [place] Kitchen VASCULAR ACCESS DEVICE INSERTION Left 11/10/2017   Procedure: INSERTION OF SUPER HERO VASCULAR ACCESS DEVICE LEFT ARM;  Surgeon: Angelia Mould, MD;  Location: [place];  Service:  Vascular;  Laterality: Left;  [place] VEIN HARVEST Left 08/24/2017   Procedure: VEIN HARVEST;  Surgeon: Conrad Reynolds, MD;  Location: [place];  Service: Vascular;  Laterality: Left;  . WOUND DEBRIDEMENT Left 12/22/2017   Procedure: EXCISION OF LEFT THIGH WOUND;  Surgeon: Elam Dutch, MD;  Location[facility];  Service: Vascular;  Laterality: Left;    MEDICATIONS: No current facility-administered medications for this encounter.    [place] Kitchen ambrisentan (LETAIRIS) 10 MG tablet  . amiodarone (PACERONE) 200 MG tablet  . apixaban (ELIQUIS) 2.5 MG TABS tablet  . atorvastatin (LIPITOR) 40 MG tablet  . cinacalcet (SENSIPAR) 30 MG tablet  . levothyroxine (SYNTHROID, LEVOTHROID) 300 MCG tablet  . midodrine (PROAMATINE) 10 MG tablet  . multivitamin (RENA-VIT) TABS tablet  . nystatin (MYCOSTATIN/NYSTOP) powder  . tadalafil (ADCIRCA/CIALIS) 20 MG tablet  . traMADol (ULTRAM) 50 MG tablet    [facility] Short Stay Center/Anesthesiology Phone [phone] 12/25/2017 5:58 PM

## 2017-12-25 NOTE — Progress Notes (Signed)
Spoke with pt for pre-op call. Pt just had surgery this week. He states no changes with his allergies, medications, medical and surgical history. Pt's last dose of Eliquis was 12/23/17. Pt denies any recent chest pain or sob. Pt states he is not diabetic.

## 2017-12-26 ENCOUNTER — Ambulatory Visit: Payer: Self-pay | Admitting: General Surgery

## 2017-12-28 MED ORDER — DEXTROSE 5 % IV SOLN
3.0000 g | INTRAVENOUS | Status: AC
  Filled 2017-12-28: qty 3000

## 2017-12-29 ENCOUNTER — Other Ambulatory Visit: Payer: Self-pay

## 2017-12-30 ENCOUNTER — Telehealth: Payer: Self-pay

## 2017-12-30 ENCOUNTER — Other Ambulatory Visit: Payer: Self-pay

## 2017-12-30 ENCOUNTER — Encounter: Payer: Self-pay | Admitting: Vascular Surgery

## 2017-12-30 ENCOUNTER — Ambulatory Visit (INDEPENDENT_AMBULATORY_CARE_PROVIDER_SITE_OTHER): Payer: Medicare HMO | Admitting: Vascular Surgery

## 2017-12-30 VITALS — BP 98/68 | HR 87 | Temp 96.8°F | Resp 18 | Ht 73.0 in | Wt 269.0 lb

## 2017-12-30 DIAGNOSIS — N186 End stage renal disease: Secondary | ICD-10-CM

## 2017-12-30 DIAGNOSIS — Z992 Dependence on renal dialysis: Secondary | ICD-10-CM

## 2017-12-30 NOTE — Telephone Encounter (Signed)
Returned call to patient's wife. Patient is having "greenish yellow" drainage from incision site. Patient is finishing up at dialysis now and will come in to the office to be seen.

## 2017-12-30 NOTE — Progress Notes (Signed)
Patient name: Adam [name]. MRN: [id number] DOB: Oct 30, 1960 Sex: male  REASON FOR VISIT:   Wound check.  Referred by the dialysis center urgently.  HPI:   Adam Nestle. is a pleasant 57 y.o. male who underwent excisional biopsy of a chronic sinus tract in the left thigh.  This was done by Dr. [name] on 12/22/2017.  He states that he has had some lymphatic drainage from this wound since surgery and given that had a slight color to it today he was sent urgently to have this evaluated.  He denies any fever or chills.  The patient currently uses his HeRO graft in the left upper arm.  He dialyzes on Monday Wednesdays and Fridays.  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  I was set up to ligate his fistula on the right in place a HeRO graft.  This graft has been working adequately.  Current Outpatient Medications  Medication Sig Dispense Refill  . ambrisentan (LETAIRIS) 10 MG tablet Take 1 tablet (10 mg total) by mouth daily. 30 tablet 11  . amiodarone (PACERONE) 200 MG tablet Take 200 mg by mouth daily.     [place] Kitchen apixaban (ELIQUIS) 2.5 MG TABS tablet Take 1 tablet (2.5 mg total) by mouth 2 (two) times daily. 60 tablet 0  . atorvastatin (LIPITOR) 40 MG tablet Take 1 tablet (40 mg total) by mouth every evening. 30 tablet 2  . cinacalcet (SENSIPAR) 30 MG tablet Take 3 tablets (90 mg total) by mouth every evening. 30 tablet 0  . levothyroxine (SYNTHROID, LEVOTHROID) 300 MCG tablet Take 1 tablet (300 mcg total) by mouth daily before breakfast. 90 tablet 3  . midodrine (PROAMATINE) 10 MG tablet TAKE 1 TABLET (10 MG TOTAL) BY MOUTH 3 (THREE) TIMES DAILY. 90 tablet 5  . multivitamin (RENA-VIT) TABS tablet TAKE 1 TABLET BY MOUTH EVERYDAY AT BEDTIME 30 tablet 0  . nystatin (MYCOSTATIN/NYSTOP) powder Apply topically 4 (four) times daily. (Patient taking differently: Apply 1 g  topically daily. ) 60 g 0  . tadalafil (ADCIRCA/CIALIS) 20 MG tablet Take 20 mg by mouth daily.     . traMADol (ULTRAM) 50 MG tablet Take 1 tablet (50 mg total) by mouth every 6 (six) hours as needed for severe pain. 8 tablet 0   No current facility-administered medications for this visit.     REVIEW OF SYSTEMS:  [X]  denotes positive finding, [ ]  denotes negative finding Vascular    Leg swelling    Cardiac    Chest pain or chest pressure:    Shortness of breath upon exertion:    Short of breath when lying flat:    Irregular heart rhythm:    Constitutional    Fever or chills:     PHYSICAL EXAM:   Vitals:   12/30/17 1645  BP: 98/68  Pulse: 87  Resp: 18  Temp: (!) 96.8 F (36 C)  TempSrc: Oral  SpO2: 100%  Weight: 268 lb 15.4 oz (122 kg)  Height: 6\' 1"  (1.854 m)    GENERAL: The patient is a well-nourished male, in no acute distress. The vital signs are documented above. CARDIOVASCULAR: There is a regular rate and rhythm. PULMONARY: There is good air exchange bilaterally without wheezing or rales. He has a left upper arm HeRO graft has a good thrill.  I cannot palpate a left radial pulse. The incision in the left  thigh has a small amount of serous drainage.  DATA:   No new data  MEDICAL ISSUES:   STATUS POST CHRONIC DRAINING WOUND LEFT THIGH: Patient had a chronic sinus tract excised by Dr. [name] last week.  He has some lymphatic drainage but no evidence of infection.  I have instructed him to continue the dry dressing changes and to elevate his legs and oftentimes this will resolve on its own.  The other consideration would be placement of a Praveena dressing over the wound which I think would be reasonable.  It is after 5 so the office is closed but we will look into this tomorrow to see if this is an option.  Otherwise he will continue his regularly scheduled follow-up visit with Dr. [name] next week.  We did discuss putting him on Keflex however he is previously had GI  problems after antibiotics in the past and given that there were no clear signs of infection I elected not to do this.  [name] [facility] [phone]

## 2017-12-31 ENCOUNTER — Other Ambulatory Visit: Payer: Self-pay | Admitting: *Deleted

## 2018-01-01 DIAGNOSIS — Z48812 Encounter for surgical aftercare following surgery on the circulatory system: Secondary | ICD-10-CM | POA: Diagnosis not present

## 2018-01-01 DIAGNOSIS — T8131XD Disruption of external operation (surgical) wound, not elsewhere classified, subsequent encounter: Secondary | ICD-10-CM | POA: Diagnosis not present

## 2018-01-01 DIAGNOSIS — I890 Lymphedema, not elsewhere classified: Secondary | ICD-10-CM | POA: Diagnosis not present

## 2018-01-01 DIAGNOSIS — T8189XD Other complications of procedures, not elsewhere classified, subsequent encounter: Secondary | ICD-10-CM | POA: Diagnosis not present

## 2018-01-01 DIAGNOSIS — N186 End stage renal disease: Secondary | ICD-10-CM | POA: Diagnosis not present

## 2018-01-01 DIAGNOSIS — I120 Hypertensive chronic kidney disease with stage 5 chronic kidney disease or end stage renal disease: Secondary | ICD-10-CM | POA: Diagnosis not present

## 2018-01-02 ENCOUNTER — Inpatient Hospital Stay (HOSPITAL_COMMUNITY)
Admission: EM | Admit: 2018-01-02 | Discharge: 2018-01-05 | DRG: 856 | Disposition: A | Discharge status: Home Health | Payer: Medicare HMO | Attending: Family Medicine | Admitting: Family Medicine

## 2018-01-02 ENCOUNTER — Other Ambulatory Visit: Payer: Self-pay

## 2018-01-02 ENCOUNTER — Encounter (HOSPITAL_COMMUNITY): Payer: Self-pay | Admitting: Emergency Medicine

## 2018-01-02 DIAGNOSIS — Z79890 Hormone replacement therapy (postmenopausal): Secondary | ICD-10-CM | POA: Diagnosis not present

## 2018-01-02 DIAGNOSIS — N186 End stage renal disease: Secondary | ICD-10-CM | POA: Diagnosis present

## 2018-01-02 DIAGNOSIS — I428 Other cardiomyopathies: Secondary | ICD-10-CM | POA: Diagnosis present

## 2018-01-02 DIAGNOSIS — E785 Hyperlipidemia, unspecified: Secondary | ICD-10-CM | POA: Diagnosis present

## 2018-01-02 DIAGNOSIS — E669 Obesity, unspecified: Secondary | ICD-10-CM | POA: Diagnosis present

## 2018-01-02 DIAGNOSIS — I9581 Postprocedural hypotension: Secondary | ICD-10-CM | POA: Diagnosis not present

## 2018-01-02 DIAGNOSIS — I2729 Other secondary pulmonary hypertension: Secondary | ICD-10-CM | POA: Diagnosis present

## 2018-01-02 DIAGNOSIS — Z825 Family history of asthma and other chronic lower respiratory diseases: Secondary | ICD-10-CM

## 2018-01-02 DIAGNOSIS — E890 Postprocedural hypothyroidism: Secondary | ICD-10-CM | POA: Diagnosis not present

## 2018-01-02 DIAGNOSIS — Z85850 Personal history of malignant neoplasm of thyroid: Secondary | ICD-10-CM | POA: Diagnosis not present

## 2018-01-02 DIAGNOSIS — Z7901 Long term (current) use of anticoagulants: Secondary | ICD-10-CM

## 2018-01-02 DIAGNOSIS — L089 Local infection of the skin and subcutaneous tissue, unspecified: Secondary | ICD-10-CM | POA: Diagnosis not present

## 2018-01-02 DIAGNOSIS — I120 Hypertensive chronic kidney disease with stage 5 chronic kidney disease or end stage renal disease: Secondary | ICD-10-CM | POA: Diagnosis not present

## 2018-01-02 DIAGNOSIS — I272 Pulmonary hypertension, unspecified: Secondary | ICD-10-CM | POA: Diagnosis not present

## 2018-01-02 DIAGNOSIS — Z91041 Radiographic dye allergy status: Secondary | ICD-10-CM

## 2018-01-02 DIAGNOSIS — Z992 Dependence on renal dialysis: Secondary | ICD-10-CM

## 2018-01-02 DIAGNOSIS — I9589 Other hypotension: Secondary | ICD-10-CM | POA: Diagnosis not present

## 2018-01-02 DIAGNOSIS — Z940 Kidney transplant status: Secondary | ICD-10-CM

## 2018-01-02 DIAGNOSIS — M170 Bilateral primary osteoarthritis of knee: Secondary | ICD-10-CM | POA: Diagnosis present

## 2018-01-02 DIAGNOSIS — I080 Rheumatic disorders of both mitral and aortic valves: Secondary | ICD-10-CM | POA: Diagnosis not present

## 2018-01-02 DIAGNOSIS — Z8249 Family history of ischemic heart disease and other diseases of the circulatory system: Secondary | ICD-10-CM | POA: Diagnosis not present

## 2018-01-02 DIAGNOSIS — D631 Anemia in chronic kidney disease: Secondary | ICD-10-CM | POA: Diagnosis present

## 2018-01-02 DIAGNOSIS — Z6835 Body mass index (BMI) 35.0-35.9, adult: Secondary | ICD-10-CM

## 2018-01-02 DIAGNOSIS — E7800 Pure hypercholesterolemia, unspecified: Secondary | ICD-10-CM | POA: Diagnosis present

## 2018-01-02 DIAGNOSIS — T8149XA Infection following a procedure, other surgical site, initial encounter: Principal | ICD-10-CM | POA: Diagnosis present

## 2018-01-02 DIAGNOSIS — Z841 Family history of disorders of kidney and ureter: Secondary | ICD-10-CM | POA: Diagnosis not present

## 2018-01-02 DIAGNOSIS — I480 Paroxysmal atrial fibrillation: Secondary | ICD-10-CM | POA: Diagnosis present

## 2018-01-02 DIAGNOSIS — S71102A Unspecified open wound, left thigh, initial encounter: Secondary | ICD-10-CM | POA: Diagnosis not present

## 2018-01-02 DIAGNOSIS — Z8546 Personal history of malignant neoplasm of prostate: Secondary | ICD-10-CM | POA: Diagnosis not present

## 2018-01-02 DIAGNOSIS — D72819 Decreased white blood cell count, unspecified: Secondary | ICD-10-CM

## 2018-01-02 DIAGNOSIS — I959 Hypotension, unspecified: Secondary | ICD-10-CM | POA: Diagnosis not present

## 2018-01-02 DIAGNOSIS — Z89429 Acquired absence of other toe(s), unspecified side: Secondary | ICD-10-CM

## 2018-01-02 DIAGNOSIS — Z888 Allergy status to other drugs, medicaments and biological substances status: Secondary | ICD-10-CM | POA: Diagnosis not present

## 2018-01-02 DIAGNOSIS — N2581 Secondary hyperparathyroidism of renal origin: Secondary | ICD-10-CM | POA: Diagnosis not present

## 2018-01-02 DIAGNOSIS — T8189XA Other complications of procedures, not elsewhere classified, initial encounter: Secondary | ICD-10-CM | POA: Diagnosis not present

## 2018-01-02 DIAGNOSIS — I4891 Unspecified atrial fibrillation: Secondary | ICD-10-CM | POA: Diagnosis not present

## 2018-01-02 DIAGNOSIS — T148XXA Other injury of unspecified body region, initial encounter: Secondary | ICD-10-CM | POA: Diagnosis not present

## 2018-01-02 LAB — BASIC METABOLIC PANEL
Anion gap: 11 (ref 5–15)
BUN: 11 mg/dL (ref 6–20)
CALCIUM: 9.7 mg/dL (ref 8.9–10.3)
CO2: 28 mmol/L (ref 22–32)
CREATININE: 5.11 mg/dL — AB (ref 0.61–1.24)
Chloride: 96 mmol/L — ABNORMAL LOW (ref 98–111)
GFR calc non Af Amer: 12 mL/min — ABNORMAL LOW (ref >60)
GFR, EST AFRICAN AMERICAN: 13 mL/min — AB (ref >60)
Glucose, Bld: 68 mg/dL — ABNORMAL LOW (ref 70–99)
Potassium: 4.1 mmol/L (ref 3.5–5.1)
SODIUM: 135 mmol/L (ref 135–145)

## 2018-01-02 LAB — CBC WITH DIFFERENTIAL/PLATELET
BASOS PCT: 0 %
Basophils Absolute: 0 10*3/uL (ref 0.0–0.1)
EOS ABS: 0.1 10*3/uL (ref 0.0–0.7)
Eosinophils Relative: 2 %
HCT: 33.1 % — ABNORMAL LOW (ref 39.0–52.0)
Hemoglobin: 10.1 g/dL — ABNORMAL LOW (ref 13.0–17.0)
LYMPHS ABS: 0.6 10*3/uL — AB (ref 0.7–4.0)
Lymphocytes Relative: 10 %
MCH: 28.1 pg (ref 26.0–34.0)
MCHC: 30.5 g/dL (ref 30.0–36.0)
MCV: 91.9 fL (ref 78.0–100.0)
MONO ABS: 0.4 10*3/uL (ref 0.1–1.0)
Monocytes Relative: 6 %
NEUTROS ABS: 4.8 10*3/uL (ref 1.7–7.7)
NEUTROS PCT: 82 %
PLATELETS: 113 10*3/uL — AB (ref 150–400)
RBC: 3.6 MIL/uL — ABNORMAL LOW (ref 4.22–5.81)
RDW: 21.4 % — AB (ref 11.5–15.5)
WBC: 5.9 10*3/uL (ref 4.0–10.5)

## 2018-01-02 MED ORDER — HEPARIN SODIUM (PORCINE) 5000 UNIT/ML IJ SOLN
5000.0000 [IU] | Freq: Three times a day (TID) | INTRAMUSCULAR | Status: DC
  Administered 2018-01-02 – 2018-01-05 (×7): 5000 [IU] via SUBCUTANEOUS
  Filled 2018-01-02 (×7): qty 1

## 2018-01-02 MED ORDER — ATORVASTATIN CALCIUM 40 MG PO TABS
40.0000 mg | ORAL_TABLET | Freq: Every evening | ORAL | Status: DC
  Administered 2018-01-02 – 2018-01-04 (×3): 40 mg via ORAL
  Filled 2018-01-02 (×3): qty 1

## 2018-01-02 MED ORDER — MIDODRINE HCL 5 MG PO TABS
10.0000 mg | ORAL_TABLET | Freq: Three times a day (TID) | ORAL | Status: DC
  Administered 2018-01-03 – 2018-01-05 (×8): 10 mg via ORAL
  Filled 2018-01-02 (×8): qty 2

## 2018-01-02 MED ORDER — FENTANYL CITRATE (PF) 100 MCG/2ML IJ SOLN
50.0000 ug | Freq: Once | INTRAMUSCULAR | Status: AC
  Administered 2018-01-02: 50 ug via INTRAVENOUS
  Filled 2018-01-02: qty 2

## 2018-01-02 MED ORDER — TADALAFIL 20 MG PO TABS
20.0000 mg | ORAL_TABLET | Freq: Every day | ORAL | Status: DC
  Administered 2018-01-04 – 2018-01-05 (×2): 20 mg via ORAL
  Filled 2018-01-02 (×3): qty 1

## 2018-01-02 MED ORDER — ACETAMINOPHEN 325 MG PO TABS: 650.0000 mg | ORAL_TABLET | Freq: Four times a day (QID) | ORAL | Status: DC | PRN

## 2018-01-02 MED ORDER — HYDROMORPHONE HCL 1 MG/ML IJ SOLN
0.5000 mg | Freq: Once | INTRAMUSCULAR | Status: AC
  Administered 2018-01-02: 0.5 mg via INTRAVENOUS
  Filled 2018-01-02: qty 1

## 2018-01-02 MED ORDER — LEVOTHYROXINE SODIUM 100 MCG PO TABS
300.0000 ug | ORAL_TABLET | Freq: Every day | ORAL | Status: DC
  Administered 2018-01-03 – 2018-01-05 (×3): 300 ug via ORAL
  Filled 2018-01-02 (×3): qty 3

## 2018-01-02 MED ORDER — AMBRISENTAN 5 MG PO TABS
10.0000 mg | ORAL_TABLET | Freq: Every day | ORAL | Status: DC
  Administered 2018-01-04 – 2018-01-05 (×2): 10 mg via ORAL
  Filled 2018-01-02 (×3): qty 2

## 2018-01-02 MED ORDER — ACETAMINOPHEN 650 MG RE SUPP: 650.0000 mg | Freq: Four times a day (QID) | RECTAL | Status: DC | PRN

## 2018-01-02 MED ORDER — RENA-VITE PO TABS
1.0000 | ORAL_TABLET | Freq: Every day | ORAL | Status: DC
  Administered 2018-01-02 – 2018-01-04 (×3): 1 via ORAL
  Filled 2018-01-02 (×3): qty 1

## 2018-01-02 MED ORDER — AMIODARONE HCL 200 MG PO TABS
200.0000 mg | ORAL_TABLET | Freq: Every day | ORAL | Status: DC
  Administered 2018-01-03 – 2018-01-05 (×3): 200 mg via ORAL
  Filled 2018-01-02 (×3): qty 1

## 2018-01-02 MED ORDER — CINACALCET HCL 30 MG PO TABS
90.0000 mg | ORAL_TABLET | Freq: Every evening | ORAL | Status: DC
  Administered 2018-01-02 – 2018-01-04 (×3): 90 mg via ORAL
  Filled 2018-01-02 (×3): qty 3

## 2018-01-02 NOTE — ED Notes (Signed)
Attempted to call report x 1  

## 2018-01-02 NOTE — H&P (Addendum)
[facility] Admission History and Physical Service Pager: [phone]  Patient name: Adam [name]. Medical record number: [id number] Date of birth: 10/22/1960 Age: 57 y.o. Gender: male  Primary Care Provider: [name], MD Consultants: Vascular surg  Code Status: Full   Chief Complaint: Increased drainage from incision, associated leg swelling   Assessment and Plan: Adam Zeiter. is a 57 y.o. male  that presented with increased drainage from his left thigh wound after excision of sinus tract from previous tunneled catheter on 8/13. PMH is significant for ESRD on dialysis MWF, paroxymal atrial fibrillation, pulmonary hypertension, aortic stenosis, s/p thyroidectomy in 2011, and calciphylaxis.   Concern for L Thigh Incision Infection: Pt reports drainage from his left thigh wound after excision of sinus tract on 8/13, that has further progressed with painful left extremity swelling starting this morning. On arrival, he was afebrile, hemodynamically stable, and in no acute distress. Physical exam notable for 10 cm left thigh vertical incision with lower wound dehiscence and dressing minimally soaked with yellow drainage, associated with TTP of surrounding 2+ pitting edema of entire left extremity. No erythema. No leukocytosis. Concern for infection in this region given increasing drainage, dehiscence, and LE swelling, however pt is afebrile without leukocytosis. Vascular surgery, Dr. Donzetta Matters, consulted in the ED and will be taking him to the OR tomorrow am for debridement.  -Admit to med-surg; attending Dr. Mingo Amber  -Vascular surgery following; appreciate recs -Vitals per routine -Pain control: Dilaudid 0.5mg  IV once, re-evaluate after OR in the am  -holding home eliquis, ppx with heparin; to d/c at [place] -NPO at midnight -Monitor CBC in the am  -vitals per unit routine  ESRD: Stable. Receives hemodialysis on MWF at [facility], via [facility] graft on L  upper arm. Last HD session yesterday.  -Will inform Nephrology; plan for dialysis on MWF while admitted  -Monitor BMP   Pulmonary hypertension: Stable. Takes ambrisentan 10 mg and tadalafil 20 mg daily. Follows with [facility], seen in 11/2017.  -Cont home ambrisentan and tadalafil; hold after midnight   Paroxysmal Atrial fibrillation: Currently in sinus rhythm, rate controlled. On Eliquis 2.5 mg BID and amiodarone 200mg  daily at home.  -Hold home eliquis -Cont home amiodarone; hold at midnight   Thyroid cancer s/p thyroidectomy: In 2011, takes levothyroxine at home.  -Cont home levothyroxine 300 mcg; hold at midnight    Normocytic Anemia: Stable. Hgb 10.1, previously 8.7 on 8/13. Likely anemia of chronic disease in the setting of ESRD.  -Monitor CBC in the am   Calciphylaxis: Currently stable. Takes tramadol at home for severe pain.  -Dilaudid once for wound above -monitor pain    Previous toe amputation: Stable, no concerns for infection. No intervention needed.   Hyperlipidemia: Takes atorvastatin 40mg  at home. Lipid panel in 2017 with LDL 81.  -Cont home atorvastatin; hold at midnight     FEN/GI: Renal diet; NPO at [place], saline lock IV in place  Prophylaxis: Heparin for procedure in the am   Disposition: Admit to med-surg; attending Dr. Mingo Amber   History of Present Illness:  Adam [name]. is a 57 y.o. male, with a past history of ESRD on dialysis MWF, paroxymal atrial fibrillation, pulmonary hypertension, aortic stenosis, s/p thyroidectomy in 2011, and calciphylaxis, that presented with increased drainage from his left thigh wound after excision of sinus tract from previous tunneled catheter on 8/13. He states following the procedure on 8/13, he noticed yellow-green thin drainage coming from the wound, however  was reassured this was a benign finding. He was seen in the vascular clinic this past Wednesday, however felt it did not look infected at that time. He was  advised to keep the area clean and dry. Upon changing his dressing this morning, noticed the stiches were "busting" with more drainage, and had associated increased painful swelling of his entire left extremity. His home wound nurse evaluated the wound this morning and was concerned for infection. He spoke with Dr. Donzetta Matters, who recommended he present to the ED for further evaluation. Denies any recent fever, chills, fatigue, nausea, or vomiting. Has been taking tramadol with improvement of pain.   On arrival to the ED, he was hemodynamically stable and in no acute distress. Vascular surgery, Dr. Donzetta Matters, was consulted in the ED. CBC and CMP notable for Cr 5.11, WBC wnl 5.9, hgb 10.1, platelets 113.    Review Of Systems: Per HPI with the following additions:   Review of Systems  Constitutional: Negative for chills, fever and malaise/fatigue.  Eyes: Negative for blurred vision and double vision.  Respiratory: Negative for cough, sputum production and shortness of breath.   Cardiovascular: Positive for leg swelling. Negative for chest pain, palpitations and orthopnea.  Gastrointestinal: Negative for abdominal pain, constipation, diarrhea, nausea and vomiting.  Genitourinary: Negative for dysuria, frequency and urgency.  Neurological: Negative for dizziness and headaches.    Patient Active Problem List   Diagnosis Date Noted  . Nodule of chest wall 11/18/2017  . Calciphylaxis   . Abscess   . Acute pain of right knee   . Malnutrition of moderate degree 11/04/2017  . Septic arthritis of knee, right ([place]) 11/02/2017  . Dialysis AV fistula infection, sequela 11/02/2017  . Mass 10/29/2017  . Near syncope 09/24/2017  . Syncope, cardiogenic   . ESRD on hemodialysis ([place])   . Anemia 09/16/2017  . Diarrhea 09/16/2017  . Tarry stools 09/16/2017  . Acute encephalopathy   . Hypotension   . Complication of arteriovenous dialysis fistula   . Stage 5 chronic kidney disease on chronic dialysis ([place])   .  Infected defibrillator ([place])   . MSSA bacteremia 08/18/2017  . Sepsis ([place]) 08/17/2017  . Lymphadenopathy, mediastinal 04/10/2017  . Hypothyroidism 10/09/2016  . Acute respiratory failure with hypoxia ([place]) 08/06/2016  . HCAP (healthcare-associated pneumonia) 08/05/2016  . Thyroid cancer ([place]) 06/12/2016  . Epistaxis 03/04/2016  . Long term current use of amiodarone 01/10/2016  . Critical lower limb ischemia 12/04/2015  . Carotid artery stenosis 05/24/2015  . Excess skin 05/24/2015  . Osteomyelitis of left foot ([place]) 04/30/2015  . Status post amputation 04/19/2015  . Wound dehiscence 04/19/2015  . Sinus congestion 03/08/2015  . Bleeding pseudoaneurysm of left brachiocephalic arteriovenous fistula ([place]) 12/24/2014  . Pure hypercholesterolemia 11/09/2014  . Obesity 11/07/2014  . Healthcare maintenance 11/07/2014  . H/O malignant neoplasm of thyroid 11/07/2014  . Toe amputation status ([place]) 10/13/2014  . ICD (implantable cardioverter-defibrillator) in place 07/25/2014  . ESRD (end stage renal disease) on hemodialysis 07/25/2014  . End-stage renal disease ([place]) 07/25/2014  . Automatic implantable cardioverter-defibrillator in situ 07/25/2014  . Pulmonary hypertension ([place]) 08/06/202016  . Secondary pulmonary hypertension 08/06/202016  . Atrial fibrillation [I48.91] 05/24/2014  . Long term current use of anticoagulant therapy 05/24/2014    Past Medical History: Past Medical History:  Diagnosis Date  . AICD (automatic cardioverter/defibrillator) present    [employer]tr[employer] - s/p extraction 08/21/17 due to staph infection  . Anemia   . Aortic stenosis    moderate  AS by 08/2016 echo  . Arthritis    "knees" (08/05/2016)  . Atrial fibrillation ([place])   . ESRD (end stage renal disease) on dialysis ([place])    "M, T, W, T, F; NX stage hemodialysis; I do it at home" (08/05/2016)  . Great toe amputation status ([place])    status post left hallux amputation 08/01/14  . History of blood transfusion 2009    "S/P biopsy for prostate cancer check"  . Hypertension   . Hypothyroidism (acquired)   . Mitral stenosis    moderate mitral stenosis  . Nonhealing surgical wound    left thigh  . Nonischemic cardiomyopathy ([place])   . Pneumonia 2014; 08/05/2016  . PONV (postoperative nausea and vomiting)   . Pulmonary HTN ([place])   . Renal insufficiency   . Thyroid cancer ([place]) 2011    Past Surgical History: Past Surgical History:  Procedure Laterality Date  . ARTERIAL LINE INSERTION Right 08/24/2017   Procedure: ARTERIAL LINE INSERTION INTO RIGHT FEMORAL ARTERY;  Surgeon: Conrad [employer], MD;  Location: [place];  Service: Vascular;  Laterality: Right;  . BASCILIC VEIN TRANSPOSITION Right 10/13/2017   Procedure: FIRST STAGE BRACHIAL VEIN TRANSPOSITION RIGHT ARM;  Surgeon: Conrad [place], MD;  Location: [place];  Service: Vascular;  Laterality: Right;  . BIOPSY  09/25/2017   Procedure: BIOPSY;  Surgeon: Ronnette Juniper, MD;  Location[facility] ENDOSCOPY;  Service: Gastroenterology;;  . CARDIAC CATHETERIZATION Right 2017  . CARDIAC DEFIBRILLATOR PLACEMENT  2009  . DIALYSIS FISTULA CREATION Left 2003   "upper arm"  . DIALYSIS/PERMA CATHETER REMOVAL Left 09/01/2017   Procedure: DIALYSIS/PERMA CATHETER REMOVAL;  Surgeon: Conrad Gates, MD;  Location: [place];  Service: Vascular;  Laterality: Left;  . ESOPHAGOGASTRODUODENOSCOPY (EGD) WITH PROPOFOL N/A 09/25/2017   Procedure: ESOPHAGOGASTRODUODENOSCOPY (EGD) WITH PROPOFOL;  Surgeon: Ronnette Juniper, MD;  Location: [place];  Service: Gastroenterology;  Laterality: N/A;  . HERNIA REPAIR    . I&D EXTREMITY Right 11/06/2017   Procedure: IRRIGATION AND DEBRIDEMENT KNEE;  Surgeon: Rod Can, MD;  Location: [place];  Service: Orthopedics;  Laterality: Right;  . ICD LEAD REMOVAL Right 08/20/2017   Procedure: ICD EXTRACTION WITH C-ARM;  Surgeon: Evans Lance, MD;  Location: [facility] OR;  Service: Cardiovascular;  Laterality: Right;  DR VAN TRIGT TO BACK-UP  . icd removed    . INSERTION OF  DIALYSIS CATHETER N/A 11/11/2016   Procedure: INSERTION OF TUNNELED DIALYSIS CATHETER;  Surgeon: Angelia Mould, MD;  Location: [place];  Service: Vascular;  Laterality: N/A;  . INSERTION OF DIALYSIS CATHETER Left 08/24/2017   Procedure: INSERTION OF DIALYSIS CATHETER;  Surgeon: Conrad Accord, MD;  Location: [place];  Service: Vascular;  Laterality: Left;  . INSERTION OF DIALYSIS CATHETER Left 09/01/2017   Procedure: INSERTION OF DIALYSIS CATHETER;  Surgeon: Conrad [place], MD;  Location: [place];  Service: Vascular;  Laterality: Left;  . KIDNEY TRANSPLANT  2009  . KNEE SURGERY Bilateral 2000s   "drained fluid"  . LAPAROSCOPIC GASTRIC BANDING  2006  . LIGATION OF ARTERIOVENOUS  FISTULA Right 11/10/2017   Procedure: LIGATION OF BRACHIAL VEIN TRANSPOSITION RIGHT ARM;  Surgeon: Angelia Mould, MD;  Location: [place];  Service: Vascular;  Laterality: Right;  . PROSTATE BIOPSY  2009  . REVISON OF ARTERIOVENOUS FISTULA Left 7/61/9509   Procedure: PLICATION OF ARTERIOVENOUS FISTULA ANEURYSM;  Surgeon: Elam Dutch, MD;  Location: [place];  Service: Vascular;  Laterality: Left;  . REVISON OF ARTERIOVENOUS FISTULA Left 11/10/2016  Procedure: REVISON OF ARTERIOVENOUS FISTULA - LEFT UPPER ARM;  Surgeon: Elam Dutch, MD;  Location: [place];  Service: Vascular;  Laterality: Left;  . REVISON OF ARTERIOVENOUS FISTULA Left 08/24/2017   Procedure: REVISON OF ARTERIOVENOUS FISTULA LEFT;  Surgeon: Conrad [place], MD;  Location: [place];  Service: Vascular;  Laterality: Left;  . TEE WITHOUT CARDIOVERSION N/A 08/20/2017   Procedure: TRANSESOPHAGEAL ECHOCARDIOGRAM (TEE);  Surgeon: Evans Lance, MD;  Location: [place];  Service: Cardiovascular;  Laterality: N/A;  . THROMBECTOMY W/ EMBOLECTOMY Left 12/25/2014   Procedure: THROMBECTOMY ARTERIOVENOUS FISTULA;  Surgeon: Elam Dutch, MD;  Location: [place];  Service: Vascular;  Laterality: Left;  . THYROIDECTOMY  2011  . TOE AMPUTATION Bilateral     right 1st and   2nd digits; left 1st and 3rd digits"  . UMBILICAL HERNIA REPAIR  X 2  . UPPER EXTREMITY VENOGRAPHY Right 11/05/2017   Procedure: UPPER EXTREMITY VENOGRAPHY;  Surgeon: Conrad Morgan, MD;  Location: [place] [facility];  Service: Cardiovascular;  Laterality: Right;  Marland Kitchen VASCULAR ACCESS DEVICE INSERTION Left 11/10/2017   Procedure: INSERTION OF SUPER HERO VASCULAR ACCESS DEVICE LEFT ARM;  Surgeon: Angelia Mould, MD;  Location: [place];  Service: Vascular;  Laterality: Left;  Marland Kitchen VEIN HARVEST Left 08/24/2017   Procedure: VEIN HARVEST;  Surgeon: Conrad [place], MD;  Location: [place];  Service: Vascular;  Laterality: Left;  . WOUND DEBRIDEMENT Left 12/22/2017   Procedure: EXCISION OF LEFT THIGH WOUND;  Surgeon: Elam Dutch, MD;  Location[facility] OR;  Service: Vascular;  Laterality: Left;    Social History: Social History   Tobacco Use  . Smoking status: Never Smoker  . Smokeless tobacco: Never Used  Substance Use Topics  . Alcohol use: Yes    Alcohol/week: 0.0 standard drinks    Comment: social  . Drug use: No   Additional social history: lives at home with wife and daughter.  No tobacco use, social alcohol use, no illicit drug use.  Please also refer to relevant sections of EMR.  Family History: Family History  Problem Relation Age of Onset  . Heart failure Mother   . Hypertension Mother   . CAD Mother 69  . Emphysema Mother   . Hypertension Father   . Kidney failure Father     Allergies and Medications: Allergies  Allergen Reactions  . Enalapril Swelling and Other (See Comments)    MOUTH SWELLING EDEMA  . Iodinated Diagnostic Agents Nausea And Vomiting and Nausea Only   No current facility-administered medications on file prior to encounter.    Current Outpatient Medications on File Prior to Encounter  Medication Sig Dispense Refill  . ambrisentan (LETAIRIS) 10 MG tablet Take 1 tablet (10 mg total) by mouth daily. 30 tablet 11  . amiodarone (PACERONE) 200 MG tablet Take  200 mg by mouth daily.     Marland Kitchen apixaban (ELIQUIS) 2.5 MG TABS tablet Take 1 tablet (2.5 mg total) by mouth 2 (two) times daily. 60 tablet 0  . atorvastatin (LIPITOR) 40 MG tablet Take 1 tablet (40 mg total) by mouth every evening. 30 tablet 2  . cinacalcet (SENSIPAR) 30 MG tablet Take 3 tablets (90 mg total) by mouth every evening. 30 tablet 0  . levothyroxine (SYNTHROID, LEVOTHROID) 300 MCG tablet Take 1 tablet (300 mcg total) by mouth daily before breakfast. 90 tablet 3  . midodrine (PROAMATINE) 10 MG tablet TAKE 1 TABLET (10 MG TOTAL) BY MOUTH 3 (THREE) TIMES DAILY. [place]  tablet 5  . multivitamin (RENA-VIT) TABS tablet TAKE 1 TABLET BY MOUTH EVERYDAY AT BEDTIME 30 tablet 0  . nystatin (MYCOSTATIN/NYSTOP) powder Apply topically 4 (four) times daily. (Patient taking differently: Apply 1 g topically daily. ) 60 g 0  . tadalafil (ADCIRCA/CIALIS) 20 MG tablet Take 20 mg by mouth daily.     . traMADol (ULTRAM) 50 MG tablet Take 1 tablet (50 mg total) by mouth every 6 (six) hours as needed for severe pain. 8 tablet 0    Objective: BP 107/72   Pulse 82   Temp (!) 97.5 F (36.4 C)   Resp 15   Ht 6\' 1"  (1.854 m)   Wt 122.5 kg   SpO2 96%   BMI 35.62 kg/m    Exam: General: Alert, oriented x4, NAD HEENT: NCAT, MMM, oropharynx nonerythematous  Cardiac: RRR, 3/6 systolic murmur noted best in left sternal border  Lungs: Clear bilaterally, no increased WOB on RA  Abdomen: soft, non-tender, non-distended, normoactive BS Msk: Moves all extremities spontaneously  Ext: Warm, dry, 2+ distal pulses, 2+ pitting edema to entire left leg, painful to palpation especially close to wound site. Negative Homans and no calf tenderness.   Psych: Appropriate effect  Skin: approximate 10 cm vertical incision on L upper thigh, wound dehiscence at lower end. Dressing minimally soaked with yellow drainage, but no active drainage noted. Associated induration.      Labs and Imaging: CBC BMET  No results for input(s):  WBC, HGB, HCT, PLT in the last 168 hours. No results for input(s): NA, K, CL, CO2, BUN, CREATININE, GLUCOSE, CALCIUM in the last 168 hours.    Patriciaann Clan, DO 01/02/2018, 5:51 PM PGY-1, [facility] pager: [phone], text pages welcome  I have seen and evaluated the above patient with Dr. Higinio Plan and agree with her documentation.  I have included my edits in blue.   Lovenia Kim MD  [place] PGY3

## 2018-01-02 NOTE — ED Notes (Signed)
IV team at bedside 

## 2018-01-02 NOTE — Consult Note (Signed)
Hospital Consult    Reason for Consult:  Left thigh wound Referring Physician:  ED MRN #:  [id number]  History of Present Illness: This is a 57 y.o. male with ESRD and underwent excisional biopsy of a draining sinus tract on 8.13 by Dr. [name]. He was last seen in our office on 8.21 and offered abx but declined. I was contacted by home health nurse today with concern of wound infection for which he now presents to the ED. He denies having any fever.  Last took his Eliquis yesterday that he takes for atrial fibrillation.  Next dialysis is Monday.  Associated swelling entire left lower extremity  Past Medical History:  Diagnosis Date  . AICD (automatic cardioverter/defibrillator) present    [employer] - s/p extraction 08/21/17 due to staph infection  . Anemia   . Aortic stenosis    moderate AS by 08/2016 echo  . Arthritis    "knees" (08/05/2016)  . Atrial fibrillation ([place])   . ESRD (end stage renal disease) on dialysis ([place])    "M, T, W, T, F; NX stage hemodialysis; I do it at home" (08/05/2016)  . Great toe amputation status ([place])    status post left hallux amputation 08/01/14  . History of blood transfusion 2009   "S/P biopsy for prostate cancer check"  . Hypertension   . Hypothyroidism (acquired)   . Mitral stenosis    moderate mitral stenosis  . Nonhealing surgical wound    left thigh  . Nonischemic cardiomyopathy ([place])   . Pneumonia 2014; 08/05/2016  . PONV (postoperative nausea and vomiting)   . Pulmonary HTN ([place])   . Renal insufficiency   . Thyroid cancer ([place]) 2011    Past Surgical History:  Procedure Laterality Date  . ARTERIAL LINE INSERTION Right 08/24/2017   Procedure: ARTERIAL LINE INSERTION INTO RIGHT FEMORAL ARTERY;  Surgeon: Conrad Porter, MD;  Location: [place];  Service: Vascular;  Laterality: Right;  . BASCILIC VEIN TRANSPOSITION Right 10/13/2017   Procedure: FIRST STAGE BRACHIAL VEIN TRANSPOSITION RIGHT ARM;  Surgeon: Conrad [place], MD;  Location: [place];   Service: Vascular;  Laterality: Right;  . BIOPSY  09/25/2017   Procedure: BIOPSY;  Surgeon: Ronnette Juniper, MD;  Location: [facility] ENDOSCOPY;  Service: Gastroenterology;;  . CARDIAC CATHETERIZATION Right 2017  . CARDIAC DEFIBRILLATOR PLACEMENT  2009  . DIALYSIS FISTULA CREATION Left 2003   "upper arm"  . DIALYSIS/PERMA CATHETER REMOVAL Left 09/01/2017   Procedure: DIALYSIS/PERMA CATHETER REMOVAL;  Surgeon: Conrad Woodville, MD;  Location: [place];  Service: Vascular;  Laterality: Left;  . ESOPHAGOGASTRODUODENOSCOPY (EGD) WITH PROPOFOL N/A 09/25/2017   Procedure: ESOPHAGOGASTRODUODENOSCOPY (EGD) WITH PROPOFOL;  Surgeon: Ronnette Juniper, MD;  Location: [place];  Service: Gastroenterology;  Laterality: N/A;  . HERNIA REPAIR    . I&D EXTREMITY Right 11/06/2017   Procedure: IRRIGATION AND DEBRIDEMENT KNEE;  Surgeon: Rod Can, MD;  Location: [place];  Service: Orthopedics;  Laterality: Right;  . ICD LEAD REMOVAL Right 08/20/2017   Procedure: ICD EXTRACTION WITH C-ARM;  Surgeon: Evans Lance, MD;  Location: [facility] OR;  Service: Cardiovascular;  Laterality: Right;  DR VAN TRIGT TO BACK-UP  . icd removed    . INSERTION OF DIALYSIS CATHETER N/A 11/11/2016   Procedure: INSERTION OF TUNNELED DIALYSIS CATHETER;  Surgeon: Angelia Mould, MD;  Location: [place];  Service: Vascular;  Laterality: N/A;  . INSERTION OF DIALYSIS CATHETER Left 08/24/2017   Procedure: INSERTION OF DIALYSIS CATHETER;  Surgeon: Conrad Graham, MD;  Location: [facility] OR;  Service: Vascular;  Laterality: Left;  . INSERTION OF DIALYSIS CATHETER Left 09/01/2017   Procedure: INSERTION OF DIALYSIS CATHETER;  Surgeon: Conrad Gentryville, MD;  Location: [place];  Service: Vascular;  Laterality: Left;  . KIDNEY TRANSPLANT  2009  . KNEE SURGERY Bilateral 2000s   "drained fluid"  . LAPAROSCOPIC GASTRIC BANDING  2006  . LIGATION OF ARTERIOVENOUS  FISTULA Right 11/10/2017   Procedure: LIGATION OF BRACHIAL VEIN TRANSPOSITION RIGHT ARM;  Surgeon: Angelia Mould, MD;  Location: [place];  Service: Vascular;  Laterality: Right;  . PROSTATE BIOPSY  2009  . REVISON OF ARTERIOVENOUS FISTULA Left 3/82/5053   Procedure: PLICATION OF ARTERIOVENOUS FISTULA ANEURYSM;  Surgeon: Elam Dutch, MD;  Location: [place];  Service: Vascular;  Laterality: Left;  . REVISON OF ARTERIOVENOUS FISTULA Left 11/10/2016   Procedure: REVISON OF ARTERIOVENOUS FISTULA - LEFT UPPER ARM;  Surgeon: Elam Dutch, MD;  Location: [place];  Service: Vascular;  Laterality: Left;  . REVISON OF ARTERIOVENOUS FISTULA Left 08/24/2017   Procedure: REVISON OF ARTERIOVENOUS FISTULA LEFT;  Surgeon: Conrad Hilmar-Irwin, MD;  Location: [place];  Service: Vascular;  Laterality: Left;  . TEE WITHOUT CARDIOVERSION N/A 08/20/2017   Procedure: TRANSESOPHAGEAL ECHOCARDIOGRAM (TEE);  Surgeon: Evans Lance, MD;  Location: [place];  Service: Cardiovascular;  Laterality: N/A;  . THROMBECTOMY W/ EMBOLECTOMY Left 12/25/2014   Procedure: THROMBECTOMY ARTERIOVENOUS FISTULA;  Surgeon: Elam Dutch, MD;  Location: [place];  Service: Vascular;  Laterality: Left;  . THYROIDECTOMY  2011  . TOE AMPUTATION Bilateral     right 1st and  2nd digits; left 1st and 3rd digits"  . UMBILICAL HERNIA REPAIR  X 2  . UPPER EXTREMITY VENOGRAPHY Right 11/05/2017   Procedure: UPPER EXTREMITY VENOGRAPHY;  Surgeon: Conrad Doffing, MD;  Location: [place] [facility];  Service: Cardiovascular;  Laterality: Right;  Marland Kitchen VASCULAR ACCESS DEVICE INSERTION Left 11/10/2017   Procedure: INSERTION OF SUPER HERO VASCULAR ACCESS DEVICE LEFT ARM;  Surgeon: Angelia Mould, MD;  Location: [place];  Service: Vascular;  Laterality: Left;  Marland Kitchen VEIN HARVEST Left 08/24/2017   Procedure: VEIN HARVEST;  Surgeon: Conrad Culberson, MD;  Location: [place];  Service: Vascular;  Laterality: Left;  . WOUND DEBRIDEMENT Left 12/22/2017   Procedure: EXCISION OF LEFT THIGH WOUND;  Surgeon: Elam Dutch, MD;  Location: [place];  Service: Vascular;  Laterality: Left;     Allergies  Allergen Reactions  . Enalapril Swelling and Other (See Comments)    MOUTH SWELLING EDEMA  . Iodinated Diagnostic Agents Nausea And Vomiting and Nausea Only    Prior to Admission medications   Medication Sig Start Date End Date Taking? Authorizing Provider  ambrisentan (LETAIRIS) 10 MG tablet Take 1 tablet (10 mg total) by mouth daily. 09/11/17   Juanito Doom, MD  amiodarone (PACERONE) 200 MG tablet Take 200 mg by mouth daily.     [provider]  apixaban (ELIQUIS) 2.5 MG TABS tablet Take 1 tablet (2.5 mg total) by mouth 2 (two) times daily. 11/11/17   Shirley, [place], DO  atorvastatin (LIPITOR) 40 MG tablet Take 1 tablet (40 mg total) by mouth every evening. 12/11/17   Diallo, Earna Coder, MD  cinacalcet (SENSIPAR) 30 MG tablet Take 3 tablets (90 mg total) by mouth every evening. 09/26/17   Shirley, [place], DO  levothyroxine (SYNTHROID, LEVOTHROID) 300 MCG tablet Take 1 tablet (300 mcg total) by mouth daily before breakfast. 09/26/17  Enid Derry, [place], DO  midodrine (PROAMATINE) 10 MG tablet TAKE 1 TABLET (10 MG TOTAL) BY MOUTH 3 (THREE) TIMES DAILY. 10/08/17   Minus Breeding, MD  multivitamin (RENA-VIT) TABS tablet TAKE 1 TABLET BY MOUTH EVERYDAY AT BEDTIME 12/11/17   Diallo, Abdoulaye, MD  nystatin (MYCOSTATIN/NYSTOP) powder Apply topically 4 (four) times daily. Patient taking differently: Apply 1 g topically daily.  11/18/17   Diallo, Earna Coder, MD  tadalafil (ADCIRCA/CIALIS) 20 MG tablet Take 20 mg by mouth daily.  10/06/17   [provider]  traMADol (ULTRAM) 50 MG tablet Take 1 tablet (50 mg total) by mouth every 6 (six) hours as needed for severe pain. 12/22/17   Gabriel Earing, PA-C    Social History   Socioeconomic History  . Marital status: Married    Spouse name: Not on file  . Number of children: 1  . Years of education: Not on file  . Highest education level: Not on file  Occupational History  . Occupation: Retired Designer, industrial/product from  [employer]  . Financial resource strain: Not on file  . Food insecurity:    Worry: Not on file    Inability: Not on file  . Transportation needs:    Medical: Not on file    Non-medical: Not on file  Tobacco Use  . Smoking status: Never Smoker  . Smokeless tobacco: Never Used  Substance and Sexual Activity  . Alcohol use: Yes    Alcohol/week: 0.0 standard drinks    Comment: social  . Drug use: No  . Sexual activity: Yes  Lifestyle  . Physical activity:    Days per week: Not on file    Minutes per session: Not on file  . Stress: Not on file  Relationships  . Social connections:    Talks on phone: Not on file    Gets together: Not on file    Attends religious service: Not on file    Active member of club or organization: Not on file    Attends meetings of clubs or organizations: Not on file    Relationship status: Not on file  . Intimate partner violence:    Fear of current or ex partner: Not on file    Emotionally abused: Not on file    Physically abused: Not on file    Forced sexual activity: Not on file  Other Topics Concern  . Not on file  Social History Narrative   Retired Archivist.       Family History  Problem Relation Age of Onset  . Heart failure Mother   . Hypertension Mother   . CAD Mother 85  . Emphysema Mother   . Hypertension Father   . Kidney failure Father     ROS:  No constitutional symptoms Greenish colored drainage left thigh wound Left leg swelling   Physical Examination  Vitals:   01/02/18 1448 01/02/18 1553  BP: 114/80 110/82  Pulse: 91 80  Resp: 17 14  Temp: (!) 97.5 F (36.4 C)   SpO2: 100% 99%   Body mass index is 35.62 kg/m.  General: No acute distress HENT: WNL, normocephalic Pulmonary: normal non-labored breathing Cardiac: Currently with regular rate and rhythm Extremities: Left thigh incision with associated induration and edema Wound was probed and 4 sutures removed with very minimal  drainage Neurologic: A&O X 3; Appropriate Affect ; SENSATION: normal; MOTOR FUNCTION:  moving all extremities equally. Speech is fluent/normal   CBC    Component Value Date/Time  WBC 5.4 12/22/2017 0835   RBC 3.18 (L) 12/22/2017 0835   HGB 8.7 (L) 12/22/2017 0835   HGB 8.0 (L) 09/16/2017 0906   HCT 28.7 (L) 12/22/2017 0835   HCT 25.9 (L) 09/16/2017 0906   PLT 117 (L) 12/22/2017 0835   PLT 128 (L) 09/16/2017 0906   MCV 90.3 12/22/2017 0835   MCV 88 09/16/2017 0906   MCH 27.4 12/22/2017 0835   MCHC 30.3 12/22/2017 0835   RDW 21.0 (H) 12/22/2017 0835   RDW 17.5 (H) 09/16/2017 0906   LYMPHSABS 0.4 (L) 12/12/2017 1941   MONOABS 0.4 12/12/2017 1941   EOSABS 0.3 12/12/2017 1941   BASOSABS 0.0 12/12/2017 1941    BMET    Component Value Date/Time   NA 135 12/22/2017 0835   K 3.7 12/22/2017 0835   CL 95 (L) 12/22/2017 0835   CO2 28 12/22/2017 0835   GLUCOSE 103 (H) 12/16/2017 2052   BUN 10 12/16/2017 2052   CREATININE 4.00 (H) 12/16/2017 2052   CREATININE 4.91 (H) 08/13/2015 1223   CALCIUM 8.7 (L) 12/16/2017 2052   GFRNONAA 15 (L) 12/16/2017 2052   GFRNONAA 13 (L) 07/27/2014 1539   GFRAA 18 (L) 12/16/2017 2052   GFRAA 15 (L) 07/27/2014 1539    COAGS: Lab Results  Component Value Date   INR 1.32 12/22/2017   INR 1.29 12/12/2017   INR 1.73 11/10/2017     ASSESSMENT/PLAN: This is a 57 y.o. male here with breakdown of his left thigh wound after excision of sinus tract from previous tunneled catheter.  He does not have a fever he does not have leukocytosis but the wound is clearly not healing well with concern for infection given his history of staph infection and concern that he could secondarily infected hero graft.  He did take Eliquis yesterday.  We will plan to take him to the operating room tomorrow to debride this thigh early in the morning.  I have asked him to be n.p.o. past midnight.  Marinus Eicher C. Donzetta Matters, MD [facility] Office:  [phone] Pager: [phone]

## 2018-01-02 NOTE — ED Provider Notes (Signed)
[facility] EMERGENCY DEPARTMENT Provider Note   CSN: [id number] Arrival date & time: 01/02/18  1442     History   Chief Complaint Chief Complaint  Patient presents with  . Post-op Problem  . Wound Check  . Leg Pain    HPI Adam [name]. is a 57 y.o. male.  HPI Patient presents to emergency department with complaint of increased drainage from the left groin wound.  Patient underwent excisional biopsy of the chronic sinus tract in the left thigh on 12/22/2017 by Dr. [name].  He has had some drainage from the wound since then.  He developed increased drainage and swelling in the last week and was seen by Dr. Doren Custard in office 3 days ago.  At that time there is no obvious signs of infection.  Patient states that this morning when he took his dressing off he noticed that the wound was more swollen and no drainage is yellow and green.  He reports severe pain around the incision and reports increased swelling to entire leg.  He denies any fever or chills.  He states he called the nurse on call who came to his house and looked at the wound and contacted vascular surgery who told him to come here.  Other than pain and swelling and drainage, patient denies any other associated symptoms.   Past Medical History:  Diagnosis Date  . AICD (automatic cardioverter/defibrillator) present    [employer] - s/p extraction 08/21/17 due to staph infection  . Anemia   . Aortic stenosis    moderate AS by 08/2016 echo  . Arthritis    "knees" (08/05/2016)  . Atrial fibrillation ([place])   . ESRD (end stage renal disease) on dialysis ([place])    "M, T, W, T, F; NX stage hemodialysis; I do it at home" (08/05/2016)  . Great toe amputation status ([place])    status post left hallux amputation 08/01/14  . History of blood transfusion 2009   "S/P biopsy for prostate cancer check"  . Hypertension   . Hypothyroidism (acquired)   . Mitral stenosis    moderate mitral stenosis  . Nonhealing surgical  wound    left thigh  . Nonischemic cardiomyopathy ([place])   . Pneumonia 2014; 08/05/2016  . PONV (postoperative nausea and vomiting)   . Pulmonary HTN ([place])   . Renal insufficiency   . Thyroid cancer [facility]) 2011    Patient Active Problem List   Diagnosis Date Noted  . Nodule of chest wall 11/18/2017  . Calciphylaxis   . Abscess   . Acute pain of right knee   . Malnutrition of moderate degree 11/04/2017  . Septic arthritis of knee, right ([place]) 11/02/2017  . Dialysis AV fistula infection, sequela 11/02/2017  . Mass 10/29/2017  . Near syncope 09/24/2017  . Syncope, cardiogenic   . ESRD on hemodialysis ([place])   . Anemia 09/16/2017  . Diarrhea 09/16/2017  . Tarry stools 09/16/2017  . Acute encephalopathy   . Hypotension   . Complication of arteriovenous dialysis fistula   . Stage 5 chronic kidney disease on chronic dialysis ([place])   . 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 MSSA bacteremia 08/18/2017  . Sepsis ([place]) 08/17/2017  . Lymphadenopathy, mediastinal 04/10/2017  . Hypothyroidism 10/09/2016  . Acute respiratory failure with hypoxia ([place]) 08/06/2016  . HCAP (healthcare-associated pneumonia) 08/05/2016  . Thyroid cancer ([place]) 06/12/2016  . Epistaxis 03/04/2016  . Long term current use of amiodarone 01/10/2016  . Critical lower limb ischemia 12/04/2015  .  Carotid artery stenosis 05/24/2015  . Excess skin 05/24/2015  . 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 Status post amputation 04/19/2015  . Wound dehiscence 04/19/2015  . Sinus congestion 03/08/2015  . Bleeding pseudoaneurysm of left brachiocephalic arteriovenous fistula ([place]) 12/24/2014  . Pure hypercholesterolemia 11/09/2014  . Obesity 11/07/2014  . Healthcare maintenance 11/07/2014  . H/O malignant neoplasm of thyroid 11/07/2014  . Toe amputation status ([place]) 10/13/2014  . ICD (implantable cardioverter-defibrillator) in place 07/25/2014  . ESRD (end stage renal disease) on hemodialysis 07/25/2014  . End-stage  renal disease ([place]) 07/25/2014  . Automatic implantable cardioverter-defibrillator in situ 07/25/2014  . Pulmonary hypertension ([place]) 05-Aug-202016  . Secondary pulmonary hypertension 05-Aug-202016  . Atrial fibrillation [I48.91] 05/24/2014  . Long term current use of anticoagulant therapy 05/24/2014    Past Surgical History:  Procedure Laterality Date  . ARTERIAL LINE INSERTION Right 08/24/2017   Procedure: ARTERIAL LINE INSERTION INTO RIGHT FEMORAL ARTERY;  Surgeon: Conrad Johnsburg, MD;  Location: [facility];  Service: Vascular;  Laterality: Right;  . BASCILIC VEIN TRANSPOSITION Right 10/13/2017   Procedure: FIRST STAGE BRACHIAL VEIN TRANSPOSITION RIGHT ARM;  Surgeon: Conrad Loyola, MD;  Location: [place];  Service: Vascular;  Laterality: Right;  . BIOPSY  09/25/2017   Procedure: BIOPSY;  Surgeon: Ronnette Juniper, MD;  Location: [facility] ENDOSCOPY;  Service: Gastroenterology;;  . CARDIAC CATHETERIZATION Right 2017  . CARDIAC DEFIBRILLATOR PLACEMENT  2009  . DIALYSIS FISTULA CREATION Left 2003   "upper arm"  . DIALYSIS/PERMA CATHETER REMOVAL Left 09/01/2017   Procedure: DIALYSIS/PERMA CATHETER REMOVAL;  Surgeon: Conrad Grandin, MD;  Location: [facility];  Service: Vascular;  Laterality: Left;  . ESOPHAGOGASTRODUODENOSCOPY (EGD) WITH PROPOFOL N/A 09/25/2017   Procedure: ESOPHAGOGASTRODUODENOSCOPY (EGD) WITH PROPOFOL;  Surgeon: Ronnette Juniper, MD;  Location: [place];  Service: Gastroenterology;  Laterality: N/A;  . HERNIA REPAIR    . I&D EXTREMITY Right 11/06/2017   Procedure: IRRIGATION AND DEBRIDEMENT KNEE;  Surgeon: Rod Can, MD;  Location: [facility];  Service: Orthopedics;  Laterality: Right;  . ICD LEAD REMOVAL Right 08/20/2017   Procedure: ICD EXTRACTION WITH C-ARM;  Surgeon: Evans Lance, MD;  Location: [facility] OR;  Service: Cardiovascular;  Laterality: Right;  DR VAN TRIGT TO BACK-UP  . icd removed    . INSERTION OF DIALYSIS CATHETER N/A 11/11/2016   Procedure: INSERTION OF TUNNELED DIALYSIS CATHETER;  Surgeon:  Angelia Mould, MD;  Location: [facility];  Service: Vascular;  Laterality: N/A;  . INSERTION OF DIALYSIS CATHETER Left 08/24/2017   Procedure: INSERTION OF DIALYSIS CATHETER;  Surgeon: Conrad Mustang, MD;  Location: [facility];  Service: Vascular;  Laterality: Left;  . INSERTION OF DIALYSIS CATHETER Left 09/01/2017   Procedure: INSERTION OF DIALYSIS CATHETER;  Surgeon: Conrad , MD;  Location: [place];  Service: Vascular;  Laterality: Left;  . KIDNEY TRANSPLANT  2009  . KNEE SURGERY Bilateral 2000s   "drained fluid"  . LAPAROSCOPIC GASTRIC BANDING  2006  . LIGATION OF ARTERIOVENOUS  FISTULA Right 11/10/2017   Procedure: LIGATION OF BRACHIAL VEIN TRANSPOSITION RIGHT ARM;  Surgeon: Angelia Mould, MD;  Location: [facility];  Service: Vascular;  Laterality: Right;  . PROSTATE BIOPSY  2009  . REVISON OF ARTERIOVENOUS FISTULA Left 2/44/0102   Procedure: PLICATION OF ARTERIOVENOUS FISTULA ANEURYSM;  Surgeon: Elam Dutch, MD;  Location: [facility];  Service: Vascular;  Laterality: Left;  . REVISON OF ARTERIOVENOUS FISTULA Left 11/10/2016   Procedure: REVISON OF ARTERIOVENOUS FISTULA - LEFT UPPER  ARM;  Surgeon: Elam Dutch, MD;  Location: [facility];  Service: Vascular;  Laterality: Left;  . REVISON OF ARTERIOVENOUS FISTULA Left 08/24/2017   Procedure: REVISON OF ARTERIOVENOUS FISTULA LEFT;  Surgeon: Conrad Lawndale, MD;  Location: [place];  Service: Vascular;  Laterality: Left;  . TEE WITHOUT CARDIOVERSION N/A 08/20/2017   Procedure: TRANSESOPHAGEAL ECHOCARDIOGRAM (TEE);  Surgeon: Evans Lance, MD;  Location: [facility];  Service: Cardiovascular;  Laterality: N/A;  . THROMBECTOMY W/ EMBOLECTOMY Left 12/25/2014   Procedure: THROMBECTOMY ARTERIOVENOUS FISTULA;  Surgeon: Elam Dutch, MD;  Location: [place];  Service: Vascular;  Laterality: Left;  . THYROIDECTOMY  2011  . TOE AMPUTATION Bilateral     right 1st and  2nd digits; left 1st and 3rd digits"  . UMBILICAL HERNIA REPAIR  X 2  . UPPER EXTREMITY  VENOGRAPHY Right 11/05/2017   Procedure: UPPER EXTREMITY VENOGRAPHY;  Surgeon: Conrad Macon, MD;  Location: [facility] CV LAB;  Service: Cardiovascular;  Laterality: Right;  Marland Kitchen VASCULAR ACCESS DEVICE INSERTION Left 11/10/2017   Procedure: INSERTION OF SUPER HERO VASCULAR ACCESS DEVICE LEFT ARM;  Surgeon: Angelia Mould, MD;  Location: [facility];  Service: Vascular;  Laterality: Left;  Marland Kitchen VEIN HARVEST Left 08/24/2017   Procedure: VEIN HARVEST;  Surgeon: Conrad [place], MD;  Location: [place];  Service: Vascular;  Laterality: Left;  . WOUND DEBRIDEMENT Left 12/22/2017   Procedure: EXCISION OF LEFT THIGH WOUND;  Surgeon: Elam Dutch, MD;  Location: [facility] OR;  Service: Vascular;  Laterality: Left;        Home Medications    Prior to Admission medications   Medication Sig Start Date End Date Taking? Authorizing Provider  ambrisentan (LETAIRIS) 10 MG tablet Take 1 tablet (10 mg total) by mouth daily. 09/11/17   Juanito Doom, MD  amiodarone (PACERONE) 200 MG tablet Take 200 mg by mouth daily.     [provider]  apixaban (ELIQUIS) 2.5 MG TABS tablet Take 1 tablet (2.5 mg total) by mouth 2 (two) times daily. 11/11/17   Shirley, [place], DO  atorvastatin (LIPITOR) 40 MG tablet Take 1 tablet (40 mg total) by mouth every evening. 12/11/17   Diallo, Earna Coder, MD  cinacalcet (SENSIPAR) 30 MG tablet Take 3 tablets (90 mg total) by mouth every evening. 09/26/17   Shirley, [place], DO  levothyroxine (SYNTHROID, LEVOTHROID) 300 MCG tablet Take 1 tablet (300 mcg total) by mouth daily before breakfast. 09/26/17   Shirley, [place], DO  midodrine (PROAMATINE) 10 MG tablet TAKE 1 TABLET (10 MG TOTAL) BY MOUTH 3 (THREE) TIMES DAILY. 10/08/17   Minus Breeding, MD  multivitamin (RENA-VIT) TABS tablet TAKE 1 TABLET BY MOUTH EVERYDAY AT BEDTIME 12/11/17   Diallo, Abdoulaye, MD  nystatin (MYCOSTATIN/NYSTOP) powder Apply topically 4 (four) times daily. Patient taking differently: Apply 1 g topically daily.  11/18/17    Diallo, Earna Coder, MD  tadalafil (ADCIRCA/CIALIS) 20 MG tablet Take 20 mg by mouth daily.  10/06/17   [provider]  traMADol (ULTRAM) 50 MG tablet Take 1 tablet (50 mg total) by mouth every 6 (six) hours as needed for severe pain. 12/22/17   Gabriel Earing, PA-C    Family History Family History  Problem Relation Age of Onset  . Heart failure Mother   . Hypertension Mother   . CAD Mother 30  . Emphysema Mother   . Hypertension Father   . Kidney failure Father     Social History Social History   Tobacco Use  .  Smoking status: Never Smoker  . Smokeless tobacco: Never Used  Substance Use Topics  . Alcohol use: Yes    Alcohol/week: 0.0 standard drinks    Comment: social  . Drug use: No     Allergies   Enalapril and Iodinated diagnostic agents   Review of Systems Review of Systems  Constitutional: Negative for chills and fever.  Cardiovascular: Positive for leg swelling.  Musculoskeletal: Positive for arthralgias.  Skin: Positive for color change and wound.  All other systems reviewed and are negative.    Physical Exam Updated Vital Signs BP 110/82 (BP Location: Right Arm)   Pulse 80   Temp (!) 97.5 F (36.4 C)   Resp 14   Ht '6\' 1"'$  (1.854 m)   Wt 122.5 kg   SpO2 99%   BMI 35.62 kg/m   Physical Exam  Constitutional: He appears well-developed and well-nourished. No distress.  HENT:  Head: Normocephalic and atraumatic.  Eyes: Conjunctivae are normal.  Neck: Neck supple.  Cardiovascular: Normal rate, regular rhythm and normal heart sounds.  Pulmonary/Chest: Effort normal. No respiratory distress. He has no wheezes. He has no rales.  Abdominal: Soft. Bowel sounds are normal. He exhibits no distension. There is no tenderness. There is no rebound.  Musculoskeletal: He exhibits no edema.  There is incision with sutures intact to the left anterior groin, there is surrounding edema.  There is diffuse tenderness to palpation.  There is purulent drainage  specifically from the distal part of the incision.  Patient has significant swelling over the entire left leg with pitting edema.  DP pulses intact.  Neurological: He is alert.  Skin: Skin is warm and dry.  Nursing note and vitals reviewed.    ED Treatments / Results  Labs (all labs ordered are listed, but only abnormal results are displayed) Labs Reviewed  CBC WITH DIFFERENTIAL/PLATELET - Abnormal; Notable for the following components:      Result Value   RBC 3.60 (*)    Hemoglobin 10.1 (*)    HCT 33.1 (*)    RDW 21.4 (*)    Platelets 113 (*)    Lymphs Abs 0.6 (*)    All other components within normal limits  BASIC METABOLIC PANEL - Abnormal; Notable for the following components:   Chloride 96 (*)    Glucose, Bld 68 (*)    Creatinine, Ser 5.11 (*)    GFR calc non Af Amer 12 (*)    GFR calc Af Amer 13 (*)    All other components within normal limits  SURGICAL PCR SCREEN  BASIC METABOLIC PANEL  CBC    EKG None  Radiology No results found.  Procedures Procedures (including critical care time)  Medications Ordered in ED Medications - No data to display   Initial Impression / Assessment and Plan / ED Course  I have reviewed the triage vital signs and the nursing notes.  Pertinent labs & imaging results that were available during my care of the patient were reviewed by me and considered in my medical decision making (see chart for details).     Patient with what appears to be infected postsurgical wound from the left thigh.  Will call vascular surgery to come down and see patient.  He is afebrile, nontoxic-appearing.  Will get CBC and be met.\  4:40 PM Spoke with Dr. Donzetta Matters, will come by and see patient.  Patient seen by Dr. Donzetta Matters, asked for medicine to admit.  I spoke with family practice who will admit patient.  Vitals:   01/02/18 1945 01/02/18 2057  BP: 109/72 (!) 115/47  Pulse: 84 85  Resp:    Temp:  97.8 F (36.6 C)  SpO2: 99% 99%     Final Clinical  Impressions(s) / ED Diagnoses   Final diagnoses:  Wound infection    ED Discharge Orders    None       Jeannett Senior, PA-C 01/03/18 0032    Virgel Manifold, MD 01/07/18 1441

## 2018-01-02 NOTE — ED Notes (Signed)
Gave pt  A sandwich and juice

## 2018-01-02 NOTE — ED Triage Notes (Signed)
Pt. Stated, I had a procedure on my left left  Cause I had a wound that was not healing, so they opened back open to clean it and connect it. Now its still draining and were they stapled it its not working,

## 2018-01-03 ENCOUNTER — Encounter (HOSPITAL_COMMUNITY): Admission: EM | Disposition: A | Discharge status: Home Health | Payer: Self-pay | Source: Home / Self Care

## 2018-01-03 ENCOUNTER — Inpatient Hospital Stay (HOSPITAL_COMMUNITY): Payer: Medicare HMO | Admitting: Certified Registered Nurse Anesthetist

## 2018-01-03 ENCOUNTER — Other Ambulatory Visit: Payer: Self-pay

## 2018-01-03 DIAGNOSIS — L089 Local infection of the skin and subcutaneous tissue, unspecified: Secondary | ICD-10-CM

## 2018-01-03 DIAGNOSIS — I480 Paroxysmal atrial fibrillation: Secondary | ICD-10-CM

## 2018-01-03 DIAGNOSIS — T148XXA Other injury of unspecified body region, initial encounter: Secondary | ICD-10-CM

## 2018-01-03 DIAGNOSIS — Z992 Dependence on renal dialysis: Secondary | ICD-10-CM

## 2018-01-03 DIAGNOSIS — N186 End stage renal disease: Secondary | ICD-10-CM

## 2018-01-03 HISTORY — PX: I & D EXTREMITY: SHX5045

## 2018-01-03 LAB — BASIC METABOLIC PANEL
Anion gap: 10 (ref 5–15)
BUN: 15 mg/dL (ref 6–20)
CALCIUM: 8.9 mg/dL (ref 8.9–10.3)
CO2: 27 mmol/L (ref 22–32)
Chloride: 99 mmol/L (ref 98–111)
Creatinine, Ser: 5.51 mg/dL — ABNORMAL HIGH (ref 0.61–1.24)
GFR calc Af Amer: 12 mL/min — ABNORMAL LOW (ref >60)
GFR calc non Af Amer: 11 mL/min — ABNORMAL LOW (ref >60)
GLUCOSE: 64 mg/dL — AB (ref 70–99)
Potassium: 3.8 mmol/L (ref 3.5–5.1)
Sodium: 136 mmol/L (ref 135–145)

## 2018-01-03 LAB — CBC
HCT: 29.4 % — ABNORMAL LOW (ref 39.0–52.0)
Hemoglobin: 9.1 g/dL — ABNORMAL LOW (ref 13.0–17.0)
MCH: 27.7 pg (ref 26.0–34.0)
MCHC: 31 g/dL (ref 30.0–36.0)
MCV: 89.4 fL (ref 78.0–100.0)
PLATELETS: 96 10*3/uL — AB (ref 150–400)
RBC: 3.29 MIL/uL — ABNORMAL LOW (ref 4.22–5.81)
RDW: 21.3 % — ABNORMAL HIGH (ref 11.5–15.5)
WBC: 4.5 10*3/uL (ref 4.0–10.5)

## 2018-01-03 LAB — SURGICAL PCR SCREEN
MRSA, PCR: NEGATIVE
Staphylococcus aureus: NEGATIVE

## 2018-01-03 SURGERY — IRRIGATION AND DEBRIDEMENT EXTREMITY
Anesthesia: General | Laterality: Left

## 2018-01-03 MED ORDER — FENTANYL CITRATE (PF) 250 MCG/5ML IJ SOLN
INTRAMUSCULAR | Status: AC
  Filled 2018-01-03: qty 5

## 2018-01-03 MED ORDER — PHENYLEPHRINE 40 MCG/ML (10ML) SYRINGE FOR IV PUSH (FOR BLOOD PRESSURE SUPPORT)
PREFILLED_SYRINGE | INTRAVENOUS | Status: DC | PRN
  Administered 2018-01-03: 200 ug via INTRAVENOUS

## 2018-01-03 MED ORDER — 0.9 % SODIUM CHLORIDE (POUR BTL) OPTIME
TOPICAL | Status: DC | PRN
  Administered 2018-01-03: 1000 mL

## 2018-01-03 MED ORDER — PROPOFOL 10 MG/ML IV BOLUS
INTRAVENOUS | Status: AC
  Filled 2018-01-03: qty 20

## 2018-01-03 MED ORDER — HYDROMORPHONE HCL 1 MG/ML IJ SOLN
INTRAMUSCULAR | Status: AC
  Filled 2018-01-03: qty 1

## 2018-01-03 MED ORDER — CEFAZOLIN SODIUM-DEXTROSE 2-3 GM-%(50ML) IV SOLR
INTRAVENOUS | Status: DC | PRN
  Administered 2018-01-03: 2 g via INTRAVENOUS

## 2018-01-03 MED ORDER — PROPOFOL 10 MG/ML IV BOLUS
INTRAVENOUS | Status: DC | PRN
  Administered 2018-01-03: 200 mg via INTRAVENOUS

## 2018-01-03 MED ORDER — CHLORHEXIDINE GLUCONATE CLOTH 2 % EX PADS: 6.0000 | MEDICATED_PAD | Freq: Every day | CUTANEOUS | Status: DC

## 2018-01-03 MED ORDER — ONDANSETRON HCL 4 MG/2ML IJ SOLN: 4.0000 mg | Freq: Once | INTRAMUSCULAR | Status: DC | PRN

## 2018-01-03 MED ORDER — LIDOCAINE 2% (20 MG/ML) 5 ML SYRINGE
INTRAMUSCULAR | Status: AC
  Filled 2018-01-03: qty 5

## 2018-01-03 MED ORDER — FENTANYL BOLUS VIA INFUSION: 20.0000 ug | INTRAVENOUS | Status: DC | PRN

## 2018-01-03 MED ORDER — SODIUM CHLORIDE 0.9 % IV SOLN
INTRAVENOUS | Status: DC
  Administered 2018-01-03: 11:00:00 via INTRAVENOUS

## 2018-01-03 MED ORDER — ONDANSETRON HCL 4 MG/2ML IJ SOLN
INTRAMUSCULAR | Status: DC | PRN
  Administered 2018-01-03: 4 mg via INTRAVENOUS

## 2018-01-03 MED ORDER — SUCCINYLCHOLINE CHLORIDE 200 MG/10ML IV SOSY
PREFILLED_SYRINGE | INTRAVENOUS | Status: AC
  Filled 2018-01-03: qty 10

## 2018-01-03 MED ORDER — FENTANYL CITRATE (PF) 100 MCG/2ML IJ SOLN
20.0000 ug | INTRAMUSCULAR | Status: DC | PRN
  Administered 2018-01-03 – 2018-01-04 (×2): 20 ug via INTRAVENOUS
  Filled 2018-01-03 (×2): qty 2

## 2018-01-03 MED ORDER — MEPERIDINE HCL 50 MG/ML IJ SOLN: 6.2500 mg | INTRAMUSCULAR | Status: DC | PRN

## 2018-01-03 MED ORDER — HYDROMORPHONE HCL 1 MG/ML IJ SOLN: 0.2500 mg | INTRAMUSCULAR | Status: DC | PRN

## 2018-01-03 MED ORDER — FENTANYL CITRATE (PF) 100 MCG/2ML IJ SOLN
INTRAMUSCULAR | Status: DC | PRN
  Administered 2018-01-03: 50 ug via INTRAVENOUS

## 2018-01-03 MED ORDER — SODIUM CHLORIDE 0.9 % IV SOLN
INTRAVENOUS | Status: DC | PRN
  Administered 2018-01-03: 07:00:00 via INTRAVENOUS

## 2018-01-03 MED ORDER — LIDOCAINE 2% (20 MG/ML) 5 ML SYRINGE
INTRAMUSCULAR | Status: DC | PRN
  Administered 2018-01-03: 100 mg via INTRAVENOUS

## 2018-01-03 MED ORDER — FENTANYL CITRATE (PF) 100 MCG/2ML IJ SOLN
50.0000 ug | Freq: Once | INTRAMUSCULAR | Status: AC
  Administered 2018-01-03: 50 ug via INTRAVENOUS
  Filled 2018-01-03: qty 2

## 2018-01-03 MED ORDER — HYDROMORPHONE HCL 1 MG/ML IJ SOLN
0.2500 mg | INTRAMUSCULAR | Status: DC | PRN
  Administered 2018-01-03 (×3): 0.25 mg via INTRAVENOUS

## 2018-01-03 MED ORDER — MIDAZOLAM HCL 2 MG/2ML IJ SOLN
INTRAMUSCULAR | Status: DC | PRN
  Administered 2018-01-03: 2 mg via INTRAVENOUS

## 2018-01-03 MED ORDER — ONDANSETRON HCL 4 MG/2ML IJ SOLN
INTRAMUSCULAR | Status: AC
  Filled 2018-01-03: qty 2

## 2018-01-03 MED ORDER — MIDAZOLAM HCL 2 MG/2ML IJ SOLN
INTRAMUSCULAR | Status: AC
  Filled 2018-01-03: qty 2

## 2018-01-03 SURGICAL SUPPLY — 39 items
BANDAGE ACE 4X5 VEL STRL LF (GAUZE/BANDAGES/DRESSINGS) IMPLANT
BANDAGE ACE 6X5 VEL STRL LF (GAUZE/BANDAGES/DRESSINGS) IMPLANT
BNDG GAUZE ELAST 4 BULKY (GAUZE/BANDAGES/DRESSINGS) IMPLANT
CANISTER SUCT 3000ML PPV (MISCELLANEOUS) ×2 IMPLANT
COVER SURGICAL LIGHT HANDLE (MISCELLANEOUS) ×2 IMPLANT
DRAPE EXTREMITY T 121X128X90 (DRAPE) IMPLANT
DRAPE HALF SHEET 40X57 (DRAPES) IMPLANT
DRAPE INCISE IOBAN 66X45 STRL (DRAPES) IMPLANT
DRAPE ORTHO SPLIT 77X108 STRL (DRAPES)
DRAPE SURG ORHT 6 SPLT 77X108 (DRAPES) IMPLANT
DRSG ADAPTIC 3X8 NADH LF (GAUZE/BANDAGES/DRESSINGS) IMPLANT
DRSG VAC ATS SM SENSATRAC (GAUZE/BANDAGES/DRESSINGS) ×2 IMPLANT
ELECT REM PT RETURN 9FT ADLT (ELECTROSURGICAL) ×2
ELECTRODE REM PT RTRN 9FT ADLT (ELECTROSURGICAL) ×1 IMPLANT
GAUZE SPONGE 4X4 12PLY STRL LF (GAUZE/BANDAGES/DRESSINGS) ×2 IMPLANT
GLOVE BIO SURGEON STRL SZ7.5 (GLOVE) ×4 IMPLANT
GLOVE BIOGEL PI IND STRL 7.5 (GLOVE) ×1 IMPLANT
GLOVE BIOGEL PI IND STRL 8 (GLOVE) ×1 IMPLANT
GLOVE BIOGEL PI INDICATOR 7.5 (GLOVE) ×1
GLOVE BIOGEL PI INDICATOR 8 (GLOVE) ×1
GOWN STRL REUS W/ TWL LRG LVL3 (GOWN DISPOSABLE) ×2 IMPLANT
GOWN STRL REUS W/ TWL XL LVL3 (GOWN DISPOSABLE) ×1 IMPLANT
GOWN STRL REUS W/TWL LRG LVL3 (GOWN DISPOSABLE) ×2
GOWN STRL REUS W/TWL XL LVL3 (GOWN DISPOSABLE) ×1
KIT BASIN OR (CUSTOM PROCEDURE TRAY) ×2 IMPLANT
KIT TURNOVER KIT B (KITS) ×2 IMPLANT
NS IRRIG 1000ML POUR BTL (IV SOLUTION) ×2 IMPLANT
PACK GENERAL/GYN (CUSTOM PROCEDURE TRAY) IMPLANT
PAD ARMBOARD 7.5X6 YLW CONV (MISCELLANEOUS) ×4 IMPLANT
SUT ETHILON 3 0 PS 1 (SUTURE) IMPLANT
SUT VIC AB 2-0 CT1 27 (SUTURE)
SUT VIC AB 2-0 CT1 TAPERPNT 27 (SUTURE) IMPLANT
SUT VIC AB 3-0 SH 27 (SUTURE)
SUT VIC AB 3-0 SH 27X BRD (SUTURE) IMPLANT
SUT VICRYL 4-0 PS2 18IN ABS (SUTURE) IMPLANT
TOWEL GREEN STERILE (TOWEL DISPOSABLE) ×4 IMPLANT
TOWEL GREEN STERILE FF (TOWEL DISPOSABLE) ×2 IMPLANT
WATER STERILE IRR 1000ML POUR (IV SOLUTION) ×2 IMPLANT
WND VAC CANISTER 500ML (MISCELLANEOUS) ×2 IMPLANT

## 2018-01-03 NOTE — Consult Note (Signed)
Renal Service Consult Note [facility]  Adam [name]. 01/03/2018 Adam [name] Requesting Physician:  Dr Annabell Sabal   Reason for Consult:  ESRD pt admitted for I&D of leg wound HPI: The patient is a 57 y.o. year-old w/ hx of PAF, esrd on MWF HD, pulm HTN admitted for I&D of left thigh wound.  Pt is postop.  We are asked to see for dialysis.   Patient had excisional biopsy of chronic sinus tract in L thigh on 8/13 by DR [name]. The sinus tract was a sequelae from a tunneled thigh HD cath.  He has no fevers, chills, SOB, CP or abd pain/ n/v/d.  L upper arm HeRO graft put in here by Dr Bridgett Larsson is working well except for occ issues w/ needle insertion.  States this is usual UF goal is around 2- 2.5 L.     ROS  denies CP  no joint pain   no HA  no blurry vision  no rash  no diarrhea  no nausea/ vomiting   Past Medical History  Past Medical History:  Diagnosis Date  . AICD (automatic cardioverter/defibrillator) present    [employer] - s/p extraction 08/21/17 due to staph infection  . Anemia   . Aortic stenosis    moderate AS by 08/2016 echo  . Arthritis    "knees" (08/05/2016)  . Atrial fibrillation ([place])   . ESRD (end stage renal disease) on dialysis ([place])    "M, T, W, T, F; NX stage hemodialysis; I do it at home" (08/05/2016)  . Great toe amputation status ([place])    status post left hallux amputation 08/01/14  . History of blood transfusion 2009   "S/P biopsy for prostate cancer check"  . Hypertension   . Hypothyroidism (acquired)   . Mitral stenosis    moderate mitral stenosis  . Nonhealing surgical wound    left thigh  . Nonischemic cardiomyopathy ([place])   . Pneumonia 2014; 08/05/2016  . PONV (postoperative nausea and vomiting)   . Pulmonary HTN ([place])   . Renal insufficiency   . Thyroid cancer ([place]) 2011   Past Surgical History  Past Surgical History:  Procedure Laterality Date  . ARTERIAL LINE INSERTION Right 08/24/2017   Procedure: ARTERIAL  LINE INSERTION INTO RIGHT FEMORAL ARTERY;  Surgeon: Conrad Belton, MD;  Location: [place];  Service: Vascular;  Laterality: Right;  . BASCILIC VEIN TRANSPOSITION Right 10/13/2017   Procedure: FIRST STAGE BRACHIAL VEIN TRANSPOSITION RIGHT ARM;  Surgeon: Conrad Allouez, MD;  Location: [place];  Service: Vascular;  Laterality: Right;  . BIOPSY  09/25/2017   Procedure: BIOPSY;  Surgeon: Ronnette Juniper, MD;  Location: [facility] ENDOSCOPY;  Service: Gastroenterology;;  . CARDIAC CATHETERIZATION Right 2017  . CARDIAC DEFIBRILLATOR PLACEMENT  2009  . DIALYSIS FISTULA CREATION Left 2003   "upper arm"  . DIALYSIS/PERMA CATHETER REMOVAL Left 09/01/2017   Procedure: DIALYSIS/PERMA CATHETER REMOVAL;  Surgeon: Conrad [place] Lake, MD;  Location: [place];  Service: Vascular;  Laterality: Left;  . ESOPHAGOGASTRODUODENOSCOPY (EGD) WITH PROPOFOL N/A 09/25/2017   Procedure: ESOPHAGOGASTRODUODENOSCOPY (EGD) WITH PROPOFOL;  Surgeon: Ronnette Juniper, MD;  Location: [place];  Service: Gastroenterology;  Laterality: N/A;  . HERNIA REPAIR    . I&D EXTREMITY Right 11/06/2017   Procedure: IRRIGATION AND DEBRIDEMENT KNEE;  Surgeon: Rod Can, MD;  Location: [place];  Service: Orthopedics;  Laterality: Right;  . ICD LEAD REMOVAL Right 08/20/2017   Procedure: ICD EXTRACTION WITH C-ARM;  Surgeon: Evans Lance, MD;  Location: [facility] OR;  Service: Cardiovascular;  Laterality: Right;  DR VAN TRIGT TO BACK-UP  . icd removed    . INSERTION OF DIALYSIS CATHETER N/A 11/11/2016   Procedure: INSERTION OF TUNNELED DIALYSIS CATHETER;  Surgeon: Angelia Mould, MD;  Location: [place];  Service: Vascular;  Laterality: N/A;  . INSERTION OF DIALYSIS CATHETER Left 08/24/2017   Procedure: INSERTION OF DIALYSIS CATHETER;  Surgeon: Conrad Boyd, MD;  Location: [place];  Service: Vascular;  Laterality: Left;  . INSERTION OF DIALYSIS CATHETER Left 09/01/2017   Procedure: INSERTION OF DIALYSIS CATHETER;  Surgeon: Conrad Boykins, MD;  Location: [place];  Service: Vascular;   Laterality: Left;  . KIDNEY TRANSPLANT  2009  . KNEE SURGERY Bilateral 2000s   "drained fluid"  . LAPAROSCOPIC GASTRIC BANDING  2006  . LIGATION OF ARTERIOVENOUS  FISTULA Right 11/10/2017   Procedure: LIGATION OF BRACHIAL VEIN TRANSPOSITION RIGHT ARM;  Surgeon: Angelia Mould, MD;  Location: [place];  Service: Vascular;  Laterality: Right;  . PROSTATE BIOPSY  2009  . REVISON OF ARTERIOVENOUS FISTULA Left 9/98/3382   Procedure: PLICATION OF ARTERIOVENOUS FISTULA ANEURYSM;  Surgeon: Elam Dutch, MD;  Location: [place];  Service: Vascular;  Laterality: Left;  . REVISON OF ARTERIOVENOUS FISTULA Left 11/10/2016   Procedure: REVISON OF ARTERIOVENOUS FISTULA - LEFT UPPER ARM;  Surgeon: Elam Dutch, MD;  Location: [place];  Service: Vascular;  Laterality: Left;  . REVISON OF ARTERIOVENOUS FISTULA Left 08/24/2017   Procedure: REVISON OF ARTERIOVENOUS FISTULA LEFT;  Surgeon: Conrad Rondo, MD;  Location: [place];  Service: Vascular;  Laterality: Left;  . TEE WITHOUT CARDIOVERSION N/A 08/20/2017   Procedure: TRANSESOPHAGEAL ECHOCARDIOGRAM (TEE);  Surgeon: Evans Lance, MD;  Location: [place];  Service: Cardiovascular;  Laterality: N/A;  . THROMBECTOMY W/ EMBOLECTOMY Left 12/25/2014   Procedure: THROMBECTOMY ARTERIOVENOUS FISTULA;  Surgeon: Elam Dutch, MD;  Location: [place];  Service: Vascular;  Laterality: Left;  . THYROIDECTOMY  2011  . TOE AMPUTATION Bilateral     right 1st and  2nd digits; left 1st and 3rd digits"  . UMBILICAL HERNIA REPAIR  X 2  . UPPER EXTREMITY VENOGRAPHY Right 11/05/2017   Procedure: UPPER EXTREMITY VENOGRAPHY;  Surgeon: Conrad St. Rose, MD;  Location: [place] CV LAB;  Service: Cardiovascular;  Laterality: Right;  Marland Kitchen VASCULAR ACCESS DEVICE INSERTION Left 11/10/2017   Procedure: INSERTION OF SUPER HERO VASCULAR ACCESS DEVICE LEFT ARM;  Surgeon: Angelia Mould, MD;  Location: [name];  Service: Vascular;  Laterality: Left;  Marland Kitchen VEIN HARVEST Left 08/24/2017    Procedure: VEIN HARVEST;  Surgeon: Conrad Merrifield, MD;  Location: [place];  Service: Vascular;  Laterality: Left;  . WOUND DEBRIDEMENT Left 12/22/2017   Procedure: EXCISION OF LEFT THIGH WOUND;  Surgeon: Elam Dutch, MD;  Location: [facility] OR;  Service: Vascular;  Laterality: Left;   Family History  Family History  Problem Relation Age of Onset  . Heart failure Mother   . Hypertension Mother   . CAD Mother 7  . Emphysema Mother   . Hypertension Father   . Kidney failure Father    Social History  reports that he has never smoked. He has never used smokeless tobacco. He reports that he drinks alcohol. He reports that he does not use drugs. Allergies  Allergies  Allergen Reactions  . Enalapril Swelling and Other (See Comments)    MOUTH SWELLING EDEMA  . Iodinated Diagnostic Agents Nausea And Vomiting and  Nausea Only   Home medications Prior to Admission medications   Medication Sig Start Date End Date Taking? Authorizing Provider  ambrisentan (LETAIRIS) 10 MG tablet Take 1 tablet (10 mg total) by mouth daily. 09/11/17  Yes Juanito Doom, MD  amiodarone (PACERONE) 200 MG tablet Take 200 mg by mouth daily.    Yes [provider]  apixaban (ELIQUIS) 2.5 MG TABS tablet Take 1 tablet (2.5 mg total) by mouth 2 (two) times daily. 11/11/17  Yes Enid Derry, [place], DO  atorvastatin (LIPITOR) 40 MG tablet Take 1 tablet (40 mg total) by mouth every evening. 12/11/17  Yes Diallo, Abdoulaye, MD  cinacalcet (SENSIPAR) 90 MG tablet Take 90 mg by mouth every evening.   Yes [provider]  levothyroxine (SYNTHROID, LEVOTHROID) 300 MCG tablet Take 1 tablet (300 mcg total) by mouth daily before breakfast. 09/26/17  Yes Enid Derry, [place], DO  midodrine (PROAMATINE) 10 MG tablet TAKE 1 TABLET (10 MG TOTAL) BY MOUTH 3 (THREE) TIMES DAILY. 10/08/17  Yes Minus Breeding, MD  multivitamin (RENA-VIT) TABS tablet TAKE 1 TABLET BY MOUTH EVERYDAY AT BEDTIME Patient taking differently: Take 1 tablet by  mouth at bedtime.  12/11/17  Yes Diallo, Abdoulaye, MD  nystatin (MYCOSTATIN/NYSTOP) powder Apply topically 4 (four) times daily. Patient taking differently: Apply 1 g topically daily.  11/18/17  Yes Diallo, Abdoulaye, MD  tadalafil (ADCIRCA/CIALIS) 20 MG tablet Take 20 mg by mouth daily.  10/06/17  Yes [provider]  traMADol (ULTRAM) 50 MG tablet Take 1 tablet (50 mg total) by mouth every 6 (six) hours as needed for severe pain. 12/22/17  Yes Rhyne, Hulen Shouts, PA-C  cinacalcet (SENSIPAR) 30 MG tablet Take 3 tablets (90 mg total) by mouth every evening. Patient not taking: Reported on 01/02/2018 09/26/17   Shirley, [place], DO   Liver Function Tests No results for input(s): AST, ALT, ALKPHOS, BILITOT, PROT, ALBUMIN in the last 168 hours. No results for input(s): LIPASE, AMYLASE in the last 168 hours. CBC Recent Labs  Lab 01/02/18 1721 01/03/18 0423  WBC 5.9 4.5  NEUTROABS 4.8  --   HGB 10.1* 9.1*  HCT 33.1* 29.4*  MCV 91.9 89.4  PLT 113* 96*   Basic Metabolic Panel Recent Labs  Lab 01/02/18 1721 01/03/18 0423  NA 135 136  K 4.1 3.8  CL 96* 99  CO2 28 27  GLUCOSE 68* 64*  BUN 11 15  CREATININE 5.11* 5.51*  CALCIUM 9.7 8.9   Iron/TIBC/Ferritin/ %Sat    Component Value Date/Time   IRON 24 (L) 08/09/2016 0433   TIBC 130 (L) 08/09/2016 0433   FERRITIN 2,257 (H) 08/09/2016 0433   IRONPCTSAT 18 08/09/2016 0433    Vitals:   01/03/18 0830 01/03/18 0845 01/03/18 0904 01/03/18 1300  BP: 90/69 (!) 85/70 (!) 83/56 (!) 89/71  Pulse: 72 72 72 73  Resp: 15 14 14    Temp:  (!) 97.5 F (36.4 C) 97.7 F (36.5 C)   TempSrc:   Oral   SpO2: 100% 99% 92%   Weight:      Height:       Exam Gen alert, no distress, calm No rash, cyanosis or gangrene Sclera anicteric, throat clear  No jvd or bruits Chest clear bilat to the bases RRR no MRG Abd soft ntnd no mass or ascites +bs GU normal male MS no joint effusions or deformity Ext trace LE edema, L thigh open wound post-op  w/  VAC in place Neuro is alert, Ox 3 , nf LUA  AVG +Bruit    Home meds:  - ambrisentan 10 qd/ tadalafil 20 qd  - amiodarone 200 qd/ apixaban 2.5 bid  - atorvastatin 40/ levothyroxine 300 ug/ mvi/ tramadol prn   - cinacalcet 90 mg qd  - midodrine 10 tid  Dialysis: MWF SW  4h   450/800  122.5kg  3K/2.25 bath  Hep none  LUA AVG HeRO - venofer 50 /wk - mircera 200ug every 2 wks last 8/20  Impression: 1. SP I&D left non-healing wound w/ VAC placement 2. ESRD on HD MWF 3. Volume is close to dry wt 4. Pulm HTN 5. Chronic hypotension - on midodrine tid at home 6. PAF - on amio/ eliquis 7. Anemia ckd - no esa due for another 9 days 8. MBD ckd - cont meds   Plan -  HD Monday, UF to dry wt  Kelly Splinter MD [facility] pager [phone]   01/03/2018, 1:43 PM

## 2018-01-03 NOTE — Progress Notes (Signed)
Family Medicine Teaching Service Daily Progress Note Intern Pager: [phone]  Patient name: Adam [name]. Medical record number: [id number] Date of birth: 02/15/1961 Age: 57 y.o. Gender: male  Primary Care Provider: [name], MD  Code Status: full Consultations: gen surg Admission date: 8/24  Pt Overview and Major Events to Date:  Admitted for refractory L thigh tissue infection  Subjective: Overnight: patient complained of pain and was given 2 doses fentanyl Today: patient went to surgery for debridement of left thigh tissue infection. Patient was hypotensive s/p surgery and 0.75mg  dilaudid. Held his morning dose of tadalafil. He did not receive his morning midrodine. I ordered q2H vitals x2 occurrences for close monitoring of his BP. Patient complains of moderate pain at surgical site. He has no troubles with breathing. No other complaints at this time.  Objective: Temp:  [97.3 F (36.3 C)-97.9 F (36.6 C)] 97.7 F (36.5 C) (08/25 0904) Pulse Rate:  [72-91] 72 (08/25 0904) Resp:  [14-18] 14 (08/25 0904) BP: (83-115)/(21-82) 83/56 (08/25 0904) SpO2:  [90 %-100 %] 92 % (08/25 0904) Weight:  [122.5 kg] 122.5 kg (08/24 1452)  Physical Exam: General: resting comfortably Cardiovascular: RRR Respiratory: CTAB Abdomen: soft, non-tender Extremities: wound vac on surgical debridement site left thigh  Assessment and Plan:  Left thigh wound- patient was taken to OR this morning by vascular for debridement. This is due to refractory infection after I&D of same location s/p tunnel catheter removal. The wound was necrotic but no signs of purulence in OR. Patient received a wound vac.  -continue to monitor wound and drainage -restart renal diet - cautious pain control with concern for hypotension - fentanyl 20 mcg q2H PRN -monitor CBC -monitor vitals q2H for 2 occurances  ESRD MWF -consulted nephrology- they are aware that he will need dialysis Monday morning -  started 49mL/hr NS for 8 hours in setting of hypotension  Hypotension BP has been in 83-85/56-70 range s/p debridement  -held today's tadalafil -give midodrine which nurse held this morning -64mL/hr NS x 8 hours -dialysis tomorrow  Pulmonary hypertension: Stable. Takes ambrisentan 10 mg and tadalafil 20 mg daily. Follows with [facility], seen in 11/2017.  -Cont home ambrisentan and tadalafil   Paroxysmal Atrial fibrillation: Currently in sinus rhythm, rate controlled. On Eliquis 2.5 mg BID and amiodarone 200mg  daily at home.  -Hold home eliquis, heparin PPx -Cont home amiodarone  Thyroid cancer s/p thyroidectomy: In 2011, takes levothyroxine at home.  -Cont home levothyroxine 300 mcg  Normocytic Anemia: Stable. Hgb 10.1, previously 8.7 on 8/13. Likely anemia of chronic disease in the setting of ESRD.  -Monitor CBC in the am   Calciphylaxis: Currently stable. Takes tramadol at home for severe pain.  -Dilaudid once for wound above -on fentanyl for surgical wound pain for cross coverage -monitor pain    Previous toe amputation: Stable, no concerns for infection. No intervention needed.   Hyperlipidemia: Takes atorvastatin 40mg  at home. Lipid panel in 2017 with LDL 81.  -Cont home atorvastatin  Fluids/diet: 41mL/hr x 8 hours NS, renal diet PPx: heparin  Disposition: med surg  Laboratory: Recent Labs  Lab 01/02/18 1721 01/03/18 0423  WBC 5.9 4.5  HGB 10.1* 9.1*  HCT 33.1* 29.4*  PLT 113* 96*   Recent Labs  Lab 01/02/18 1721 01/03/18 0423  NA 135 136  K 4.1 3.8  CL 96* 99  CO2 28 27  BUN 11 15  CREATININE 5.11* 5.51*  CALCIUM 9.7 8.9  GLUCOSE 68* 64*   Imaging/Diagnostic  Tests: None today. Debridement   Richarda Osmond, DO 01/03/2018, 10:51 AM PGY-1, [place] Intern pager: [phone], text pages welcome

## 2018-01-03 NOTE — Anesthesia Procedure Notes (Signed)
Procedure Name: LMA Insertion Date/Time: 01/03/2018 7:39 AM Performed by: Leonor Liv, CRNA Pre-anesthesia Checklist: Patient identified, Emergency Drugs available, Suction available and Patient being monitored Patient Re-evaluated:Patient Re-evaluated prior to induction Oxygen Delivery Method: Circle System Utilized Preoxygenation: Pre-oxygenation with 100% oxygen Induction Type: IV induction Ventilation: Mask ventilation without difficulty LMA: LMA inserted LMA Size: 5.0 Number of attempts: 1 Placement Confirmation: positive ETCO2 Tube secured with: Tape Dental Injury: Teeth and Oropharynx as per pre-operative assessment

## 2018-01-03 NOTE — Consult Note (Signed)
[place] Nurse wound consult note Reason for Consult: Chronic, nonhealing wound. OR debridement by Dr. Donzetta Matters on Sunday, 8/25 with NPWT dressing placed intraoperatively. First surgical dressing change on Tuesday, 8/27 with surgeon (or his designee) and [place] Nurse. Please indicate time for dressing change on Tuesday. Wound type: Surgical  WOC nursing team will follow, and will remain available to this patient, the nursing and medical teams.   Thanks, Maudie Flakes, MSN, RN, [place], Arther Abbott  Pager# [phone]

## 2018-01-03 NOTE — Progress Notes (Signed)
Pt concerned with current blood pressure 100/52. Explained to pt doctors will see him at OR to address this. Endorsed to [place] and anesthesiologist.

## 2018-01-03 NOTE — Progress Notes (Signed)
  Progress Note    01/03/2018 7:20 AM Day of Surgery  Subjective:  No overnight issues  Vitals:   01/03/18 0431 01/03/18 0453  BP: (!) 93/21 (!) 100/52  Pulse: 73   Resp:    Temp: 97.9 F (36.6 C)   SpO2: 100%     Physical Exam: aaox3 Right leg dressing cdi  CBC    Component Value Date/Time   WBC 4.5 01/03/2018 0423   RBC 3.29 (L) 01/03/2018 0423   HGB 9.1 (L) 01/03/2018 0423   HGB 8.0 (L) 09/16/2017 0906   HCT 29.4 (L) 01/03/2018 0423   HCT 25.9 (L) 09/16/2017 0906   PLT 96 (L) 01/03/2018 0423   PLT 128 (L) 09/16/2017 0906   MCV 89.4 01/03/2018 0423   MCV 88 09/16/2017 0906   MCH 27.7 01/03/2018 0423   MCHC 31.0 01/03/2018 0423   RDW 21.3 (H) 01/03/2018 0423   RDW 17.5 (H) 09/16/2017 0906   LYMPHSABS 0.6 (L) 01/02/2018 1721   MONOABS 0.4 01/02/2018 1721   EOSABS 0.1 01/02/2018 1721   BASOSABS 0.0 01/02/2018 1721    BMET    Component Value Date/Time   NA 136 01/03/2018 0423   K 3.8 01/03/2018 0423   CL 99 01/03/2018 0423   CO2 27 01/03/2018 0423   GLUCOSE 64 (L) 01/03/2018 0423   BUN 15 01/03/2018 0423   CREATININE 5.51 (H) 01/03/2018 0423   CREATININE 4.91 (H) 08/13/2015 1223   CALCIUM 8.9 01/03/2018 0423   GFRNONAA 11 (L) 01/03/2018 0423   GFRNONAA 13 (L) 07/27/2014 1539   GFRAA 12 (L) 01/03/2018 0423   GFRAA 15 (L) 07/27/2014 1539    INR    Component Value Date/Time   INR 1.32 12/22/2017 0856    No intake or output data in the 24 hours ending 01/03/18 0720   Assessment:  57 y.o. male is here with poorly healing right thigh wound  Plan: OR today for I+D or right thigh wound, possible wound vac placement   Nicey Krah C. Donzetta Matters, MD [facility] Office: [phone] Pager: [phone]  01/03/2018 7:20 AM

## 2018-01-03 NOTE — Op Note (Signed)
    Patient name: Adam [name]. MRN: [id number] DOB: 03-31-1961 Sex: male  01/03/2018 Pre-operative Diagnosis: esrd, left thigh wound Post-operative diagnosis:  Same Surgeon:  Eda Paschal. Donzetta Matters, MD Procedure Performed: Debridement of skin and soft tissue to 10 x 2.5 x 2.5cm and placement of negative pressure dressing  Indications: 57 year old male with long history of end-stage renal disease currently dialyzing via left upper arm hero graft.  He recently underwent excision of a draining sinus in his left thigh that was secondary to tunnel catheter.  He was seen in our office for concern of infection and then home health sent him in to the emergency department yesterday for increasing swelling and drainage.  He is now indicated for debridement.  Findings: There is no evidence of purulence.  The wound itself was necrotic and this was debrided sharply to the size noted above.   Procedure:  The patient was identified in the holding area and taken to operating room where LMA anesthesia was induced he was sterilely prepped and draped in the left thigh given antibiotics a timeout was called.  We began by removing the existing sutures.  The wound did not demonstrate any healing and splayed immediately open.  I debrided the necrotic edges back to healthy bleeding tissue with scissor.  Hemostasis was obtained with pressure.  It measured 10 x 2.5 x 2.5 cm.  I irrigated copiously and placed a wound VAC sponge to -125 suction.  He was then allowed away from anesthesia having tolerated procedure well without immediate complication.  All counts were correct at completion.  EBL: 10 cc.   Adam [name] C. Donzetta Matters, MD [facility] Office: [phone] Pager: [phone]

## 2018-01-03 NOTE — Anesthesia Postprocedure Evaluation (Signed)
Anesthesia Post Note  Patient: Dontrae Morini.  Procedure(s) Performed: IRRIGATION AND DEBRIDEMENT OF THIGH, wound vac application  wound size 10cm x 2.5cm x 2.5cm (Left )     Patient location during evaluation: PACU Anesthesia Type: General Level of consciousness: awake and alert Pain management: pain level controlled Vital Signs Assessment: post-procedure vital signs reviewed and stable Respiratory status: spontaneous breathing, nonlabored ventilation, respiratory function stable and patient connected to nasal cannula oxygen Cardiovascular status: blood pressure returned to baseline and stable Postop Assessment: no apparent nausea or vomiting Anesthetic complications: no    Last Vitals:  Vitals:   01/03/18 0845 01/03/18 0904  BP: (!) 85/70 (!) 83/56  Pulse: 72 72  Resp: 14 14  Temp: (!) 36.4 C 36.5 C  SpO2: 99% 92%    Last Pain:  Vitals:   01/03/18 0904  TempSrc: Oral  PainSc:                  Kamaiyah Uselton Matthew

## 2018-01-03 NOTE — Transfer of Care (Signed)
Immediate Anesthesia Transfer of Care Note  Patient: Adam [name].  Procedure(s) Performed: IRRIGATION AND DEBRIDEMENT OF THIGH, wound vac application  wound size 10cm x 2.5cm x 2.5cm (Left )  Patient Location: PACU  Anesthesia Type:General  Level of Consciousness: awake, alert  and oriented  Airway & Oxygen Therapy: Patient Spontanous Breathing and Patient connected to face mask oxygen  Post-op Assessment: Report given to RN, Post -op Vital signs reviewed and stable and Patient moving all extremities  Post vital signs: Reviewed and stable  Last Vitals:  Vitals Value Taken Time  BP 89/35 01/03/2018  8:14 AM  Temp    Pulse 80 01/03/2018  8:16 AM  Resp 18 01/03/2018  8:16 AM  SpO2 100 % 01/03/2018  8:16 AM  Vitals shown include unvalidated device data.  Last Pain:  Vitals:   01/03/18 0542  TempSrc:   PainSc: 7       Patients Stated Pain Goal: 4 (09/98/33 8250)  Complications: No apparent anesthesia complications

## 2018-01-03 NOTE — Anesthesia Preprocedure Evaluation (Addendum)
Anesthesia Evaluation  Patient identified by MRN, date of birth, ID band Patient awake    History of Anesthesia Complications (+) PONV  Airway Mallampati: II  TM Distance: >3 FB Neck ROM: Full    Dental  (+) Teeth Intact, Dental Advisory Given   Pulmonary    Pulmonary exam normal        Cardiovascular hypertension, Pt. on medications + Peripheral Vascular Disease  Normal cardiovascular exam(-) Cardiac Defibrillator      Neuro/Psych    GI/Hepatic   Endo/Other    Renal/GU Dialysis and ESRFRenal diseaseLast dialysis 8/23     Musculoskeletal   Abdominal   Peds  Hematology   Anesthesia Other Findings Hx thyroidectomy  Reproductive/Obstetrics                           Anesthesia Physical Anesthesia Plan  ASA: III  Anesthesia Plan: General   Post-op Pain Management:    Induction: Intravenous  PONV Risk Score and Plan: 2 and Treatment may vary due to age or medical condition and Ondansetron  Airway Management Planned: LMA  Additional Equipment:   Intra-op Plan:   Post-operative Plan: Extubation in OR  Informed Consent: I have reviewed the patients History and Physical, chart, labs and discussed the procedure including the risks, benefits and alternatives for the proposed anesthesia with the patient or authorized representative who has indicated his/her understanding and acceptance.     Plan Discussed with: CRNA and Surgeon  Anesthesia Plan Comments:         Anesthesia Quick Evaluation

## 2018-01-04 ENCOUNTER — Encounter (HOSPITAL_COMMUNITY): Payer: Self-pay | Admitting: Vascular Surgery

## 2018-01-04 DIAGNOSIS — D72819 Decreased white blood cell count, unspecified: Secondary | ICD-10-CM

## 2018-01-04 LAB — IRON AND TIBC
IRON: 41 ug/dL — AB (ref 45–182)
SATURATION RATIOS: 39 % (ref 17.9–39.5)
TIBC: 106 ug/dL — ABNORMAL LOW (ref 250–450)
UIBC: 65 ug/dL

## 2018-01-04 LAB — CBC
HEMATOCRIT: 27 % — AB (ref 39.0–52.0)
HEMOGLOBIN: 8.3 g/dL — AB (ref 13.0–17.0)
MCH: 27.4 pg (ref 26.0–34.0)
MCHC: 30.7 g/dL (ref 30.0–36.0)
MCV: 89.1 fL (ref 78.0–100.0)
Platelets: 109 10*3/uL — ABNORMAL LOW (ref 150–400)
RBC: 3.03 MIL/uL — ABNORMAL LOW (ref 4.22–5.81)
RDW: 21.1 % — ABNORMAL HIGH (ref 11.5–15.5)
WBC: 11.1 10*3/uL — ABNORMAL HIGH (ref 4.0–10.5)

## 2018-01-04 LAB — RENAL FUNCTION PANEL
ALBUMIN: 2.7 g/dL — AB (ref 3.5–5.0)
ANION GAP: 12 (ref 5–15)
BUN: 16 mg/dL (ref 6–20)
CO2: 25 mmol/L (ref 22–32)
Calcium: 8.6 mg/dL — ABNORMAL LOW (ref 8.9–10.3)
Chloride: 97 mmol/L — ABNORMAL LOW (ref 98–111)
Creatinine, Ser: 6.73 mg/dL — ABNORMAL HIGH (ref 0.61–1.24)
GFR calc Af Amer: 10 mL/min — ABNORMAL LOW (ref >60)
GFR, EST NON AFRICAN AMERICAN: 8 mL/min — AB (ref >60)
Glucose, Bld: 69 mg/dL — ABNORMAL LOW (ref 70–99)
PHOSPHORUS: 3.6 mg/dL (ref 2.5–4.6)
POTASSIUM: 4.1 mmol/L (ref 3.5–5.1)
Sodium: 134 mmol/L — ABNORMAL LOW (ref 135–145)

## 2018-01-04 LAB — GLUCOSE, CAPILLARY: Glucose-Capillary: 71 mg/dL (ref 70–99)

## 2018-01-04 MED ORDER — DARBEPOETIN ALFA 60 MCG/0.3ML IJ SOSY
60.0000 ug | PREFILLED_SYRINGE | Freq: Once | INTRAMUSCULAR | Status: AC
  Administered 2018-01-04: 60 ug via INTRAVENOUS
  Filled 2018-01-04 (×2): qty 0.3

## 2018-01-04 MED ORDER — SODIUM CHLORIDE 0.9 % IV SOLN: 100.0000 mL | INTRAVENOUS | Status: DC | PRN

## 2018-01-04 NOTE — Plan of Care (Signed)
  Problem: Nutrition: Goal: Adequate nutrition will be maintained Outcome: Progressing   Problem: Coping: Goal: Level of anxiety will decrease Outcome: Progressing   Problem: Elimination: Goal: Will not experience complications related to bowel motility Outcome: Progressing   

## 2018-01-04 NOTE — Procedures (Signed)
I was present at this dialysis session, have reviewed the session itself and made  appropriate changes.   Jannifer Hick MD [facility] pager [phone]   01/04/2018, 10:12 AM

## 2018-01-04 NOTE — Progress Notes (Addendum)
[facility] [place]  Subjective  - Comfortable, no left LE complaints.   Objective 99/73 78 97.9 F (36.6 C) (Oral) 18 93%  Intake/Output Summary (Last 24 hours) at 01/04/2018 1009 Last data filed at 01/03/2018 1700 Gross per 24 hour  Intake 370.19 ml  Output -  Net 370.19 ml   Left LE active range of motion intact, sensation intact at baseline. Left thigh wound vac to suction 20 cc out put     Assessment/Planning: POD # 1  Debridement of skin and soft tissue to 10 x 2.5 x 2.5cm and placement of negative  Plan for wound vac change Tuesday we can take the dressing down and place wet to dry then the [facility] nurse can replace the wound vac.   Adam [name] 01/04/2018 10:09 AM -- Pt seen and examined as above Will remove VAC to inspect wound tomorrow prior to [place], MD [facility] [place]: [phone] Pager: [phone]  Laboratory Lab Results: Recent Labs    01/03/18 0423 01/04/18 0805  WBC 4.5 11.1*  HGB 9.1* 8.3*  HCT 29.4* 27.0*  PLT 96* 109*   BMET Recent Labs    01/03/18 0423 01/04/18 0805  NA 136 134*  K 3.8 4.1  CL 99 97*  CO2 27 25  GLUCOSE 64* 69*  BUN 15 16  CREATININE 5.51* 6.73*  CALCIUM 8.9 8.6*    COAG Lab Results  Component Value Date   INR 1.32 12/22/2017   INR 1.29 12/12/2017   INR 1.73 11/10/2017   No results found for: PTT

## 2018-01-04 NOTE — Progress Notes (Signed)
Subjective:   Feels ok, likely d/c tomorrow so wound vac can be arranged.    Objective Vital signs in last 24 hours: Vitals:   01/03/18 2126 01/04/18 0116 01/04/18 0507 01/04/18 0700  BP: 120/86 97/68 (!) (P) 88/60 (!) (P) 85/56  Pulse: 87 82 81 (P) 70  Resp: 18 18 18  (P) 18  Temp: 98.2 F (36.8 C) 98.3 F (36.8 C) 98.9 F (37.2 C) (P) 97.9 F (36.6 C)  TempSrc: Oral Oral Oral (P) Oral  SpO2: 100% 93% 91% (P) 93%  Weight:    (P) 124.6 kg  Height:       Weight change:   Intake/Output Summary (Last 24 hours) at 01/04/2018 1003 Last data filed at 01/03/2018 1700 Gross per 24 hour  Intake 370.19 ml  Output -  Net 370.19 ml   Dialysis: MWF SW  4h   450/800  122.5kg  3K/2.25 bath  Hep none  LUA AVG HeRO - venofer 50 /wk - mircera 200ug every 2 wks last 8/20  Impression: 1. SP I&D left non-healing wound w/ VAC placement 2. ESRD on HD MWF 3. Volume is close to dry wt 4. Pulm HTN 5. Chronic hypotension - on midodrine tid at home 6. PAF - on amio/ eliquis 7. Anemia ckd - no esa due for another 9 days; Hb trended down from 10.1 to 8.3 over past 48h - says wound ruptured twice at home and he lost a 'lot of blood'.  Supplement dose of ESA today with aranesp 60.  Check iron profile too with blood loss.   8. MBD ckd - cont meds   Plan -  HD Today , UF to dry wt.   Supplement ESA given anemia worsening post op, check iron.    Justin Mend  Pager [phone]   Labs: Basic Metabolic Panel: Recent Labs  Lab 01/02/18 1721 01/03/18 0423 01/04/18 0805  NA 135 136 134*  K 4.1 3.8 4.1  CL 96* 99 97*  CO2 28 27 25   GLUCOSE 68* 64* 69*  BUN 11 15 16   CREATININE 5.11* 5.51* 6.73*  CALCIUM 9.7 8.9 8.6*  PHOS  --   --  3.6   Liver Function Tests: Recent Labs  Lab 01/04/18 0805  ALBUMIN 2.7*   No results for input(s): LIPASE, AMYLASE in the last 168 hours. No results for input(s): AMMONIA in the last 168 hours. CBC: Recent Labs  Lab 01/02/18 1721 01/03/18 0423  01/04/18 0805  WBC 5.9 4.5 11.1*  NEUTROABS 4.8  --   --   HGB 10.1* 9.1* 8.3*  HCT 33.1* 29.4* 27.0*  MCV 91.9 89.4 89.1  PLT 113* 96* 109*   Cardiac Enzymes: No results for input(s): CKTOTAL, CKMB, CKMBINDEX, TROPONINI in the last 168 hours. CBG: No results for input(s): GLUCAP in the last 168 hours.  Iron Studies: No results for input(s): IRON, TIBC, TRANSFERRIN, FERRITIN in the last 72 hours. Studies/Results: No results found. Medications: Infusions: . sodium chloride      Scheduled Medications: . ambrisentan  10 mg Oral Daily  . amiodarone  200 mg Oral Daily  . atorvastatin  40 mg Oral QPM  . Chlorhexidine Gluconate Cloth  6 each Topical Q0600  . cinacalcet  90 mg Oral QPM  . heparin  5,000 Units Subcutaneous Q8H  . levothyroxine  300 mcg Oral QAC breakfast  . midodrine  10 mg Oral TID WC  . multivitamin  1 tablet Oral QHS  . tadalafil  20 mg Oral Daily  have reviewed scheduled and prn medications.  Physical Exam: General: appears well Heart: RRR Lungs:normal wob laying flat Abdomen: soft Extremities: no edema Dialysis Access: LUE AV access + thrill and bruit    01/04/2018,10:03 AM  LOS: 2 days

## 2018-01-04 NOTE — Care Management Note (Addendum)
Case Management Note  Patient Details  Name: Adam [name]. MRN: [id number] Date of Birth: [birth year]  Subjective/Objective:                    Action/Plan:  Spoke to [place] PA, plan to take patient to surgery tomorrow and change VAC. Patient will need VAC at discharge ( unsure of discharge date).   PA/ MD will sign Negative Pressure form tomorrow. Asked if surgeon prefers [facility] negative pressure or [facility] negative pressure, preference [facility]. [facility] form placed on shadow chart.   Spoke to patient and family at bedside. Confirmed face sheet information. Patient has had a [facility] VAC in past with [facility] for [facility]. He would like [facility] again. Referral given to [facility] with [facility]. Dan aware patient leaves for HD at 1000 am Mon, Wed, and Friday.  Will need [facility] application signed and orders for [facility] with amt of pressure and dressing change schedule.  Expected Discharge Date:  01/06/18               Expected Discharge Plan:  [place]  In-House Referral:     Discharge planning Services  CM Consult  Post Acute Care Choice:  Home Health, Durable Medical Equipment Choice offered to:  Patient  DME Arranged:  Vac DME Agency:  [facility]  HH Arranged:    [place] Agency:  [place]  Status of Service:  In process, will continue to follow  If discussed at Long Length of Stay Meetings, dates discussed:    Additional Comments:  Marilu Favre, RN 01/04/2018, 1:29 PM

## 2018-01-04 NOTE — Progress Notes (Signed)
Family Medicine Teaching Service Daily Progress Note Intern Pager: [phone]  Patient name: Adam [name]. Medical record number: [id number] Date of birth: 1961/02/26 Age: 57 y.o. Gender: male  Primary Care Provider: [name], MD Consultations: Vascular surgery Code Status: Full  Pt Overview and Major Events to Date:  8/24 Admit for refractory L thigh tissue infection 8/25 L-thigh debridement without signs of purulence and wound VAC placed by VVS  Assessment and Plan: Adam [name]. is a 57 y.o. male  that presented with increased drainage from his left thigh wound after excision of sinus tract from previous tunneled catheter on 8/13. PMH is significant for ESRD on dialysis MWF, paroxymal atrial fibrillation, pulmonary hypertension, aortic stenosis, s/p thyroidectomy in 2011, and calciphylaxis.   1.  Nonpurulent chronic left thigh wound: Stable since debridement on 8/25 by vascular.  History of refractory infection after I&D s/p tunneled catheter removal.  Continues to express blood-tinged serous fluid, 200 cc at present.  No signs of infection at site.  Patient remains afebrile with stable vital signs while off antibiotics. - Vascular surgery consulted, appreciate recommendations - Continue monitoring wound VAC output - Monitor for signs of infection at which point antibiotics should be warranted - Fentanyl 20 mcg every 2 hours as needed for pain at surgical site  2.  ESRD on HD: Chronic.  Receives HD on M/B/F LUE ABG.  Remains without need for emergent dialysis.  Appears to be near dry weight. - Nephrology consulted, appreciate recommendations - Continue scheduled HD  3.  Chronic hypotension: On midodrine 3 times daily at home.  On HD.  BP stable at systolics 91Q and diastolic 94H. - Continue midodrine 10 mg 3 times daily  4.  Pulmonary hypertension: Chronic.  Stable.  Followed by [facility] pulmonology.  Last seen 11/2017.   - Continue home ambrisentan 10 mg daily  and tadalafil 20 mg daily  5.  Paroxysmal atrial fibrillation: Chronic.  Stable, HR 81.  Controlled on amiodarone.  Anticoagulated with Eliquis. - Continue home amiodarone 200 mg daily and holding Eliquis given recent I&D, now on Lovenox SQ  6.  Thyroid cancer s/p thyroidectomy: In 2011, takes levothyroxine at home.  -Cont home levothyroxine 300 mcg  7.  Normocytic Anemia: Stable. Hgb 10.1, previously 8.7 on 8/13. Likely anemia of chronic disease in the setting of ESRD.  -Monitor CBC in the am   8.  Calciphylaxis: Currently stable. Takes tramadol at home for severe pain.  -Dilaudid once for wound above -on fentanyl for surgical wound pain for cross coverage -monitor pain    9.  Previous toe amputation: Stable, no concerns for infection. No intervention needed.   10.  Hyperlipidemia: Takes atorvastatin 40mg  at home. Lipid panel in 2017 with LDL 81.  -Cont home atorvastatin  Fluids/diet: Renal diet PPx: Heparin SQ  Disposition: Pending improvement of left thigh wound with wound VAC.  We will continue scheduled HD.  Subjective: Patient reports feeling well today.  His left thigh pain is significantly improved with use of wound VAC.  He has no other complaints today.  Objective: Vitals:   01/04/18 0116 01/04/18 0507  BP: 97/68 (!) 88/67  Pulse: 82 81  Resp: 18 18  Temp: 98.3 F (36.8 C) 98.9 F (37.2 C)  SpO2: 93% 91%   Physical Exam: General: calm male lying in bed receiving HD, NAD with non-toxic appearance HEENT: normocephalic, atraumatic, moist mucous membranes Neck: supple, non-tender without lymphadenopathy Cardiovascular: regular rate and rhythm with holosystolic murmur, rubs, or  gallops Lungs: clear to auscultation bilaterally with normal work of breathing on room air Skin: warm, dry, cap refill < 2 seconds, intact wound VAC on left thigh without erythema or induration Extremities: warm and well perfused, normal tone, no edema      Laboratory: Recent  Labs  Lab 01/02/18 1721 01/03/18 0423 01/04/18 0805  WBC 5.9 4.5 11.1*  HGB 10.1* 9.1* 8.3*  HCT 33.1* 29.4* 27.0*  PLT 113* 96* 109*   Recent Labs  Lab 01/02/18 1721 01/03/18 0423 01/04/18 0805  NA 135 136 134*  K 4.1 3.8 4.1  CL 96* 99 97*  CO2 28 27 25   BUN 11 15 16   CREATININE 5.11* 5.51* 6.73*  CALCIUM 9.7 8.9 8.6*  GLUCOSE 68* 64* 69*   Imaging/Diagnostic Tests: None.    Ivor, Kishi, DO 01/04/2018, 9:19 AM PGY-3, [place] Intern pager: [phone], text pages welcome

## 2018-01-04 NOTE — Progress Notes (Signed)
Pt noted low BP 78/51, pt is asymptomatic.Pt refused rechecked BP. Pt stated " My BP has always been low". On call MD made aware with no new order.

## 2018-01-05 DIAGNOSIS — I959 Hypotension, unspecified: Secondary | ICD-10-CM

## 2018-01-05 LAB — CBC
HEMATOCRIT: 26.3 % — AB (ref 39.0–52.0)
Hemoglobin: 8 g/dL — ABNORMAL LOW (ref 13.0–17.0)
MCH: 27.4 pg (ref 26.0–34.0)
MCHC: 30.4 g/dL (ref 30.0–36.0)
MCV: 90.1 fL (ref 78.0–100.0)
Platelets: 105 10*3/uL — ABNORMAL LOW (ref 150–400)
RBC: 2.92 MIL/uL — AB (ref 4.22–5.81)
RDW: 21.1 % — ABNORMAL HIGH (ref 11.5–15.5)
WBC: 7.3 10*3/uL (ref 4.0–10.5)

## 2018-01-05 LAB — RENAL FUNCTION PANEL
ALBUMIN: 2.4 g/dL — AB (ref 3.5–5.0)
ANION GAP: 9 (ref 5–15)
BUN: 12 mg/dL (ref 6–20)
CO2: 30 mmol/L (ref 22–32)
Calcium: 8.2 mg/dL — ABNORMAL LOW (ref 8.9–10.3)
Chloride: 98 mmol/L (ref 98–111)
Creatinine, Ser: 5.23 mg/dL — ABNORMAL HIGH (ref 0.61–1.24)
GFR, EST AFRICAN AMERICAN: 13 mL/min — AB (ref >60)
GFR, EST NON AFRICAN AMERICAN: 11 mL/min — AB (ref >60)
Glucose, Bld: 67 mg/dL — ABNORMAL LOW (ref 70–99)
PHOSPHORUS: 2.8 mg/dL (ref 2.5–4.6)
POTASSIUM: 3.8 mmol/L (ref 3.5–5.1)
SODIUM: 137 mmol/L (ref 135–145)

## 2018-01-05 LAB — HEPATITIS B SURFACE ANTIGEN: HEP B S AG: NEGATIVE

## 2018-01-05 MED ORDER — CHLORHEXIDINE GLUCONATE CLOTH 2 % EX PADS: 6.0000 | MEDICATED_PAD | Freq: Every day | CUTANEOUS | Status: DC

## 2018-01-05 MED ORDER — OXYCODONE HCL 5 MG PO TABS: 5.0000 mg | ORAL_TABLET | Freq: Four times a day (QID) | ORAL | 0 refills | Status: AC | PRN

## 2018-01-05 MED ORDER — OXYCODONE HCL 5 MG PO TABS
5.0000 mg | ORAL_TABLET | Freq: Four times a day (QID) | ORAL | Status: DC | PRN
  Administered 2018-01-05: 5 mg via ORAL
  Filled 2018-01-05: qty 1

## 2018-01-05 NOTE — Discharge Summary (Signed)
[facility] Discharge Summary  Patient name: Adam [name]. Medical record number: [id number] Date of birth: June 01, 1960 Age: 57 y.o. Gender: male Date of Admission: 01/02/2018  Date of Discharge: 01/05/2018 Admitting Physician: Alveda Reasons, MD  Primary Care Provider: [name], MD Consultants: Vascular surgery   Indication for Hospitalization: L thigh incision debridement   Discharge Diagnoses/Problem List:  Patient Active Problem List   Diagnosis Date Noted  . Leukopenia   . Wound infection 01/02/2018  . Nodule of chest wall 11/18/2017  . Calciphylaxis   . Abscess   . Acute pain of right knee   . Malnutrition of moderate degree 11/04/2017  . Septic arthritis of knee, right ([place]) 11/02/2017  . Dialysis AV fistula infection, sequela 11/02/2017  . Mass 10/29/2017  . Near syncope 09/24/2017  . Syncope, cardiogenic   . ESRD on hemodialysis ([place])   . Anemia 09/16/2017  . Diarrhea 09/16/2017  . Tarry stools 09/16/2017  . Acute encephalopathy   . Hypotension   . Complication of arteriovenous dialysis fistula   . Stage 5 chronic kidney disease on chronic dialysis ([place])   . Infected defibrillator ([place])   . MSSA bacteremia 08/18/2017  . Sepsis ([place]) 08/17/2017  . Lymphadenopathy, mediastinal 04/10/2017  . Hypothyroidism 10/09/2016  . Acute respiratory failure with hypoxia ([place]) 08/06/2016  . HCAP (healthcare-associated pneumonia) 08/05/2016  . Thyroid cancer ([place]) 06/12/2016  . Epistaxis 03/04/2016  . Long term current use of amiodarone 01/10/2016  . Critical lower limb ischemia 12/04/2015  . Carotid artery stenosis 05/24/2015  . Excess skin 05/24/2015  . 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 Status post amputation 04/19/2015  . Wound dehiscence 04/19/2015  . Sinus congestion 03/08/2015  . Bleeding pseudoaneurysm of left brachiocephalic arteriovenous fistula ([place]) 12/24/2014  . Pure hypercholesterolemia 11/09/2014  .  Obesity 11/07/2014  . Healthcare maintenance 11/07/2014  . H/O malignant neoplasm of thyroid 11/07/2014  . Toe amputation status ([place]) 10/13/2014  . ICD (implantable cardioverter-defibrillator) in place 07/25/2014  . ESRD (end stage renal disease) on hemodialysis 07/25/2014  . End-stage renal disease ([place]) 07/25/2014  . Automatic implantable cardioverter-defibrillator in situ 07/25/2014  . Pulmonary hypertension ([place]) 12-01-202016  . Secondary pulmonary hypertension 12-01-202016  . Atrial fibrillation [I48.91] 05/24/2014  . Long term current use of anticoagulant therapy 05/24/2014    Disposition: Home  Discharge Condition: Stable   Discharge Exam:  General: Alert, NAD HEENT: NCAT, MMM Cardiac: RRR with 2/6 systolic murmur  Lungs: Clear bilaterally, no increased WOB  Abdomen: soft, NT, ND, normoactive BS Msk: Moves all extremities spontaneously  Ext: Warm, dry, 2+ distal pulses, 1+ pitting edema around medial thigh and to mid shin on L extremity. Hero Graft in place on LUE. L thigh incision with intact wound vac in place, without erythema or induration.    Brief Hospital Course:  Mr. Berry is a 57 year old gentleman, with a past history significant for ESRD on dialysis, paroxysmal A. fib, pulmonary hypertension, aortic stenosis, and calciphylaxis, the presented on 8/24 with increased yellow-green drainage from his left thigh incision and surrounding edema for one day. He recently had an excision of a sinus tract in that region performed by VS on 8/13. Afebrile, VSS, and no leukocytosis on arrival. Vascular surgery was consulted, subsequently performed a debridement and placed a wound VAC on 8/25. His wound was re-evaluated on 8/27 with a wound vac change and surgically cleared from VS. During his stay, he remained afebrile and hemodynamically stable. His pain  was well managed and sent with a few additional doses of oxycodone 5mg  at discharge. He is to follow up with vascular in 3 weeks and have  three times weekly wound vac dressing changes.    His chronic medical conditions were managed appropriately during his stay. He received his dialysis per his MWF while admitted.   Incision at admission:     Issues for Follow Up:  1. Ensure follow up with Vascular surgery and receiving wound vac changes.  2. Continue to monitor blood pressure: on midodrine TID, BP ranged around 73-220'U systolic during stay.   Significant Procedures: Debridement with wound vac placement, Dr. Donzetta Matters on 8/25  Significant Labs and Imaging:  Recent Labs  Lab 01/03/18 0423 01/04/18 0805 01/05/18 0634  WBC 4.5 11.1* 7.3  HGB 9.1* 8.3* 8.0*  HCT 29.4* 27.0* 26.3*  PLT 96* 109* 105*   Recent Labs  Lab 01/02/18 1721 01/03/18 0423 01/04/18 0805 01/05/18 0634  NA 135 136 134* 137  K 4.1 3.8 4.1 3.8  CL 96* 99 97* 98  CO2 28 27 25 30   GLUCOSE 68* 64* 69* 67*  BUN 11 15 16 12   CREATININE 5.11* 5.51* 6.73* 5.23*  CALCIUM 9.7 8.9 8.6* 8.2*  PHOS  --   --  3.6 2.8  ALBUMIN  --   --  2.7* 2.4*     Results/Tests Pending at Time of Discharge: None   Discharge Medications:  Allergies as of 01/05/2018      Reactions   Enalapril Swelling, Other (See Comments)   MOUTH SWELLING EDEMA   Iodinated Diagnostic Agents Nausea And Vomiting, Nausea Only      Medication List    TAKE these medications   ambrisentan 10 MG tablet Commonly known as:  LETAIRIS Take 1 tablet (10 mg total) by mouth daily.   amiodarone 200 MG tablet Commonly known as:  PACERONE Take 200 mg by mouth daily.   apixaban 2.5 MG Tabs tablet Commonly known as:  ELIQUIS Take 1 tablet (2.5 mg total) by mouth 2 (two) times daily.   atorvastatin 40 MG tablet Commonly known as:  LIPITOR Take 1 tablet (40 mg total) by mouth every evening.   cinacalcet 90 MG tablet Commonly known as:  SENSIPAR Take 90 mg by mouth every evening. What changed:  Another medication with the same name was removed. Continue taking this medication, and  follow the directions you see here.   levothyroxine 300 MCG tablet Commonly known as:  SYNTHROID, LEVOTHROID Take 1 tablet (300 mcg total) by mouth daily before breakfast.   midodrine 10 MG tablet Commonly known as:  PROAMATINE TAKE 1 TABLET (10 MG TOTAL) BY MOUTH 3 (THREE) TIMES DAILY.   multivitamin Tabs tablet TAKE 1 TABLET BY MOUTH EVERYDAY AT BEDTIME What changed:  See the new instructions.   nystatin powder Commonly known as:  MYCOSTATIN/NYSTOP Apply topically 4 (four) times daily. What changed:    how much to take  when to take this   oxyCODONE 5 MG immediate release tablet Commonly known as:  Oxy IR/ROXICODONE Take 1 tablet (5 mg total) by mouth every 6 (six) hours as needed for up to 5 days for moderate pain or severe pain.   tadalafil 20 MG tablet Commonly known as:  ADCIRCA/CIALIS Take 20 mg by mouth daily.   traMADol 50 MG tablet Commonly known as:  ULTRAM Take 1 tablet (50 mg total) by mouth every 6 (six) hours as needed for severe pain.  Discharge Instructions: Please refer to Patient Instructions section of EMR for full details.  Patient was counseled important signs and symptoms that should prompt return to medical care, changes in medications, dietary instructions, activity restrictions, and follow up appointments.   Follow-Up Appointments: Follow-up Information    Elam Dutch, MD Follow up in 3 week(s).   Specialties:  Vascular Surgery, Cardiology Why:  office will call Contact information: [place] [address] [place] Follow up on 01/12/2018.   Why:  @ 10:10am. Please arrive 15 minutes prior to appointment. Contact information: [place] [phone]          Patriciaann Clan, DO 01/05/2018, 10:07 PM PGY-1, [name]

## 2018-01-05 NOTE — Progress Notes (Signed)
Patient given discharge instructions and verbalized understanding. Patient received wound vac, was connected to continuous suction. Patient left unit in stable condition.

## 2018-01-05 NOTE — Progress Notes (Signed)
[facility] [place]  Subjective  - feels ok   Objective (!) 73/48 72 98 F (36.7 C) (Oral) 16 100%  Intake/Output Summary (Last 24 hours) at 01/05/2018 0744 Last data filed at 01/05/2018 0522 Gross per 24 hour  Intake 120 ml  Output 2100 ml  Net -1980 ml   Left thigh wound 7 x 4 cm 2 cm depth, 80% granulation  Assessment/Planning: Wound currently clean Continue VAC Ok to d/c from my standpoint  [name] 01/05/2018 7:44 AM --  Laboratory Lab Results: Recent Labs    01/03/18 0423 01/04/18 0805  WBC 4.5 11.1*  HGB 9.1* 8.3*  HCT 29.4* 27.0*  PLT 96* 109*   BMET Recent Labs    01/03/18 0423 01/04/18 0805  NA 136 134*  K 3.8 4.1  CL 99 97*  CO2 27 25  GLUCOSE 64* 69*  BUN 15 16  CREATININE 5.51* 6.73*  CALCIUM 8.9 8.6*    COAG Lab Results  Component Value Date   INR 1.32 12/22/2017   INR 1.29 12/12/2017   INR 1.73 11/10/2017   No results found for: PTT

## 2018-01-05 NOTE — Care Management (Signed)
See note from yesterday . [facility] wound VAC application on shadow chart not signed. Called Dr [name] office was directed to call [facility] [phone]. Called same, Juliann Pulse from operating room returned call, was directed to page  [place] PA [phone]. Awaiting call back. Theda Sers will come sign [facility] application. Once signed will fax to [facility] . Patient and wife aware. Magdalen Spatz RN BSN [phone]

## 2018-01-05 NOTE — Progress Notes (Signed)
Subjective:  Tolerated HD yest.  /noted for plans for dc home  Today   Objective Vital signs in last 24 hours: Vitals:   01/04/18 1500 01/04/18 2133 01/05/18 0008 01/05/18 0517  BP: 97/73 (!) 78/51 (!) 82/62 (!) 73/48  Pulse: 78 71 67 72  Resp:  17  16  Temp: (!) 97.5 F (36.4 C) 98.8 F (37.1 C)  98 F (36.7 C)  TempSrc: Oral Oral  Oral  SpO2: 100% 97%  100%  Weight:      Height:       Weight change: -2.1 kg  Physical Exam: General: alert obese AAm  nad ,Ox3 Heart: RRR 2/6 sem  , no rub or g  Lungs: CTA Abdomen:  Obese , soft , NT, ND  Extremities: L thigh wound dressing dry clean wound vac /  bipedal edeam 2 + left  1+ right  Dialysis Access: pos bruit  LUA   Hero  graft    op Dialysis:MWF SW 4h 450/800 122.5kg 3K/2.25 bath Hep none LUA AVG HeRO - venofer 50 /wk - mircera 200ug every 2 wks last 8/20  Problem/Plan: 1. SP I&D left non-healing wound w/ VAC placement= per VVS  2. ESRD on HD MWF 3. Volume is close to dry wt= no current changes 4. Pulm HTN 5. Chronic hypotension - on midodrine tid at home 6. PAF - on amio/ eliquis 7. Anemia ckd - no esa due for another 9 days; Hb trended down from 10.1 to 8.3 > 8.0 this am  - says wound ruptured twice at home and he lost a 'lot of blood'.  Supplement dose of  23mcg aranesp on hd yesterday ESA   . Iron study with sat ratio 39%  , continue weekly fe and  ESA  On op hd   Ernest Haber, PA-C [place] [phone] 01/05/2018,9:17 AM  LOS: 3 days   Labs: Basic Metabolic Panel: Recent Labs  Lab 01/03/18 0423 01/04/18 0805 01/05/18 0634  NA 136 134* 137  K 3.8 4.1 3.8  CL 99 97* 98  CO2 27 25 30   GLUCOSE 64* 69* 67*  BUN 15 16 12   CREATININE 5.51* 6.73* 5.23*  CALCIUM 8.9 8.6* 8.2*  PHOS  --  3.6 2.8   Liver Function Tests: Recent Labs  Lab 01/04/18 0805 01/05/18 0634  ALBUMIN 2.7* 2.4*   No results for input(s): LIPASE, AMYLASE in the last 168 hours. No results for input(s):  AMMONIA in the last 168 hours. CBC: Recent Labs  Lab 01/02/18 1721 01/03/18 0423 01/04/18 0805 01/05/18 0634  WBC 5.9 4.5 11.1* 7.3  NEUTROABS 4.8  --   --   --   HGB 10.1* 9.1* 8.3* 8.0*  HCT 33.1* 29.4* 27.0* 26.3*  MCV 91.9 89.4 89.1 90.1  PLT 113* 96* 109* 105*   Cardiac Enzymes: No results for input(s): CKTOTAL, CKMB, CKMBINDEX, TROPONINI in the last 168 hours. CBG: Recent Labs  Lab 01/04/18 1130  GLUCAP 71    Studies/Results: No results found. Medications:  . ambrisentan  10 mg Oral Daily  . amiodarone  200 mg Oral Daily  . atorvastatin  40 mg Oral QPM  . Chlorhexidine Gluconate Cloth  6 each Topical Q0600  . cinacalcet  90 mg Oral QPM  . heparin  5,000 Units Subcutaneous Q8H  . levothyroxine  300 mcg Oral QAC breakfast  . midodrine  10 mg Oral TID WC  . multivitamin  1 tablet Oral QHS  . tadalafil  20 mg  Oral Daily

## 2018-01-05 NOTE — Care Management (Signed)
[facility] , VAC application signed and completed. Faxed to [facility] confirmed fax was received [facility] aware discharge is today.   Magdalen Spatz RN BSN [phone]

## 2018-01-05 NOTE — Consult Note (Signed)
[place] Nurse wound consult note NPWT (VAC) dressing change completed in [facility] 6N25.  Spouse present.  Patient tolerated very well.  One piece of black foam placed into wound bed.  Immediate seal obtained. Reason for Consult: VAC dressing change.  Patient states he is to be discharged home today. Wound type: Surgical wound Measurement:10 cm x 4.5 cm x 3 cm.  NO undermining or tunneling. Wound bed: 98 % pink granulation tissue. Sparse, small, scattered yellow tissue areas. Drainage (amount, consistency, odor) Serosanginous in cannister.  No odor. Periwound: Darkened tissue extending medially toward inner, upper thigh.  Slightly firm as well. Dressing procedure/placement/frequency: Tues-Thurs-Sat VAC dressing change.  This can be managed by primary RN staff should the patient not be discharged home.  It is a straight forward, non-complicated dressing change. Monitor the wound area(s) for worsening of condition such as: Signs/symptoms of infection,  Increase in size,  Development of or worsening of odor, Development of pain, or increased pain at the affected locations.  Notify the medical team if any of these develop.  Thank you for the consult.  Discussed plan of care with the patient and bedside nurse.  [place] nurse will not follow at this time.  Please re-consult the [place] team if needed.  Val Riles, RN, MSN, CWOCN, CNS-BC, pager [phone]

## 2018-01-05 NOTE — Progress Notes (Signed)
Family Medicine Teaching Service Daily Progress Note Intern Pager: [phone]  Patient name: Adam [name]. Medical record number: [id number] Date of birth: 1960-08-09 Age: 57 y.o. Gender: male  Primary Care Provider: [name], MD Consultants: VS Code Status: Full   Pt Overview and Major Events to Date:  8/24 Admit for refractory L thigh tissue infection 8/25 L-thigh debridement without signs of purulence and wound VAC placed by VVS  Assessment and Plan: Adam Singhis a 57 y.o.malethat presented with increased drainage from his left thigh wound after excision of sinus tract from previous tunneled catheter on 8/13. PMH is significant forESRD on dialysis MWF, paroxymal atrial fibrillation, pulmonary hypertension, aortic stenosis, s/p thyroidectomy in 2011, and calciphylaxis.   Nonpurulent left thigh incision: POD #2 from debridement per VS.   Minimal pain in region, leg edema unchanged from previous. Pt remains afebrile and stable off of antibiotics. Noted leukocytosis on 8/26, however likely stress demargination from surgery without evidence of infection. Vascular surgery planning for wound vac change this am.  -Vascular surgery following; wound vac change this am  -Continue monitoring wound VAC outpt -Transition to Oxycodone 5mg  q6 PRN from Fentanyl 20 mcg q2 hrs- has not required pain control since 1400 8/26  ESRD on HD: Chronic.   Receives HD on MWF via [facility]. Appears to be near dry weight. - Nephrology following, appreciate recommendations - Continue scheduled HD  Chronic hypotension:  BP systolics ranging 99-242'A, asymptomatic.  - Continue home midodrine 10 mg 3 times daily --Monitor BP closely   Pulmonary hypertension: Chronic, stable.   Followed by [facility] pulmonology.  - Continue home ambrisentan 10 mg daily and tadalafil 20 mg daily  Paroxysmal atrial fibrillation: Chronic, stable.   HR 70-80's. Controlled on amiodarone.   Anticoagulated with Eliquis. - Continue home amiodarone 200 mg daily and holding Eliquis given recent I&D, now on heparin   Thyroid cancer s/p thyroidectomy: Stable  TSH 0.69 in 10/2017.  -Cont home levothyroxine 300 mcg  NormocyticAnemia of Chronic disease:Stable.  Hgb pending this am, 57 yest, baseline 8-9. Received ESA yesterday per nephrology.  -Monitor CBC in the am  Calciphylaxis:Currently stable.  Takes tramadol at home for severe pain.  -Oxycodone for surgical wound pain as above  -Monitor pain  Previous toe amputation:Stable No concerns for infection. No intervention needed.   Hyperlipidemia: Stable.  -Cont home atorvastatin  Fluids/diet: Renal diet PPx: Heparin SQ  Disposition: Continued care, wound vac change this am per VS, potential d/c this afternoon- if so, will restart home eliquis tomorrow and send home with few doses of oxycodone for pain control.    Subjective:  No acute events overnight, no complaints this am. Denies any pain with his wound vac.   Objective: Temp:  [97.5 F (36.4 C)-98.9 F (37.2 C)] 98.8 F (37.1 C) (08/26 2133) Pulse Rate:  [65-82] 67 (08/27 0008) Resp:  [16-18] 17 (08/26 2133) BP: (78-107)/(51-75) 82/62 (08/27 0008) SpO2:  [91 %-100 %] 97 % (08/26 2133) Weight:  [122.5 kg-124.6 kg] 122.5 kg (08/26 1130) Physical Exam: General: Alert, NAD HEENT: NCAT, MMM Cardiac: RRR with 2/6 systolic murmur  Lungs: Clear bilaterally, no increased WOB  Abdomen: soft, non-tender, non-distended, normoactive BS Msk: Moves all extremities spontaneously  Ext: Warm, dry, 2+ distal pulses, L lower extremity with continued 1+ pitting edema to knee and then medial thigh, tender to palpation. L thigh incision wound with wound vac in place.       Laboratory: Recent Labs  Lab 01/02/18  1721 01/03/18 0423 01/04/18 0805  WBC 5.9 4.5 11.1*  HGB 10.1* 9.1* 8.3*  HCT 33.1* 29.4* 27.0*  PLT 113* 96* 109*   Recent Labs  Lab  01/02/18 1721 01/03/18 0423 01/04/18 0805  NA 135 136 134*  K 4.1 3.8 4.1  CL 96* 99 97*  CO2 28 27 25   BUN 11 15 16   CREATININE 5.11* 5.51* 6.73*  CALCIUM 9.7 8.9 8.6*  GLUCOSE 68* 64* 69*     Imaging/Diagnostic Tests: No results found.  Patriciaann Clan, DO 01/05/2018, 1:01 AM PGY-1, [place] Intern pager: [phone], text pages welcome

## 2018-01-05 NOTE — Care Management Important Message (Signed)
Important Message  Patient Details  Name: Adam [name]. MRN: [id number] Date of Birth: July 10, 1960   Medicare Important Message Given:  Yes    Elroy Schembri 01/05/2018, 3:51 PM

## 2018-01-06 DIAGNOSIS — T8189XA Other complications of procedures, not elsewhere classified, initial encounter: Secondary | ICD-10-CM | POA: Diagnosis not present

## 2018-01-07 ENCOUNTER — Ambulatory Visit (INDEPENDENT_AMBULATORY_CARE_PROVIDER_SITE_OTHER): Payer: Self-pay | Admitting: Physician Assistant

## 2018-01-07 ENCOUNTER — Encounter: Payer: Self-pay | Admitting: Family Medicine

## 2018-01-07 ENCOUNTER — Other Ambulatory Visit: Payer: Self-pay | Admitting: Family Medicine

## 2018-01-07 DIAGNOSIS — I480 Paroxysmal atrial fibrillation: Secondary | ICD-10-CM | POA: Diagnosis not present

## 2018-01-07 DIAGNOSIS — N186 End stage renal disease: Secondary | ICD-10-CM | POA: Diagnosis not present

## 2018-01-07 DIAGNOSIS — I080 Rheumatic disorders of both mitral and aortic valves: Secondary | ICD-10-CM | POA: Diagnosis not present

## 2018-01-07 DIAGNOSIS — I255 Ischemic cardiomyopathy: Secondary | ICD-10-CM | POA: Diagnosis not present

## 2018-01-07 DIAGNOSIS — T8189XD Other complications of procedures, not elsewhere classified, subsequent encounter: Secondary | ICD-10-CM | POA: Diagnosis not present

## 2018-01-07 DIAGNOSIS — Z992 Dependence on renal dialysis: Secondary | ICD-10-CM

## 2018-01-07 DIAGNOSIS — D631 Anemia in chronic kidney disease: Secondary | ICD-10-CM | POA: Diagnosis not present

## 2018-01-07 DIAGNOSIS — I120 Hypertensive chronic kidney disease with stage 5 chronic kidney disease or end stage renal disease: Secondary | ICD-10-CM | POA: Diagnosis not present

## 2018-01-07 DIAGNOSIS — I9589 Other hypotension: Secondary | ICD-10-CM | POA: Diagnosis not present

## 2018-01-07 DIAGNOSIS — I700 Atherosclerosis of aorta: Secondary | ICD-10-CM | POA: Diagnosis not present

## 2018-01-07 DIAGNOSIS — L987 Excessive and redundant skin and subcutaneous tissue: Secondary | ICD-10-CM

## 2018-01-07 DIAGNOSIS — I272 Pulmonary hypertension, unspecified: Secondary | ICD-10-CM | POA: Diagnosis not present

## 2018-01-07 MED ORDER — TRAMADOL HCL 50 MG PO TABS: 50.0000 mg | ORAL_TABLET | Freq: Three times a day (TID) | ORAL | 0 refills | Status: DC | PRN

## 2018-01-07 NOTE — Progress Notes (Signed)
  POST OPERATIVE OFFICE NOTE    CC:  F/u for surgery  HPI:  This is a 57 y.o. male with ESRD and underwent excisional biopsy of a draining sinus tract on 8.13 by Dr. [name]. He was last seen in our office on 8.21 and offered abx but declined. I was contacted by home health nurse today with concern of wound infection for which he now presents to the ED.    s/p Irrigation and debridement of left thigh wound with wound vac placement.   He was discharged on 01/05/2018 with [facility] RN for wound vac changes 3 times per week.  He is here today to discuss his pain control and states he was given 5 percocet 5 mg tablets at discharge.  He has one left and states that the medication is not working for his pain.  He was taking tramadol prior to this and thinks this will work better.   Allergies  Allergen Reactions  . Enalapril Swelling and Other (See Comments)    MOUTH SWELLING EDEMA  . Iodinated Diagnostic Agents Nausea And Vomiting and Nausea Only    Current Outpatient Medications  Medication Sig Dispense Refill  . ambrisentan (LETAIRIS) 10 MG tablet Take 1 tablet (10 mg total) by mouth daily. 30 tablet 11  . amiodarone (PACERONE) 200 MG tablet Take 200 mg by mouth daily.     Marland Kitchen apixaban (ELIQUIS) 2.5 MG TABS tablet Take 1 tablet (2.5 mg total) by mouth 2 (two) times daily. 60 tablet 0  . atorvastatin (LIPITOR) 40 MG tablet Take 1 tablet (40 mg total) by mouth every evening. 30 tablet 2  . cinacalcet (SENSIPAR) 90 MG tablet Take 90 mg by mouth every evening.    Marland Kitchen levothyroxine (SYNTHROID, LEVOTHROID) 300 MCG tablet Take 1 tablet (300 mcg total) by mouth daily before breakfast. 90 tablet 3  . midodrine (PROAMATINE) 10 MG tablet TAKE 1 TABLET (10 MG TOTAL) BY MOUTH 3 (THREE) TIMES DAILY. 90 tablet 5  . multivitamin (RENA-VIT) TABS tablet TAKE 1 TABLET BY MOUTH EVERYDAY AT BEDTIME (Patient taking differently: Take 1 tablet by mouth at bedtime. ) 30 tablet 0  . nystatin (MYCOSTATIN/NYSTOP) powder Apply  topically 4 (four) times daily. (Patient taking differently: Apply 1 g topically daily. ) 60 g 0  . oxyCODONE (OXY IR/ROXICODONE) 5 MG immediate release tablet Take 1 tablet (5 mg total) by mouth every 6 (six) hours as needed for up to 5 days for moderate pain or severe pain. 5 tablet 0  . tadalafil (ADCIRCA/CIALIS) 20 MG tablet Take 20 mg by mouth daily.     . traMADol (ULTRAM) 50 MG tablet Take 1 tablet (50 mg total) by mouth every 8 (eight) hours as needed for severe pain. 30 tablet 0   No current facility-administered medications for this visit.        Assessment/Plan:  This is a 57 y.o. male who is s/p: irrigation and debridement of left thigh wound. Left thigh wound 7 x 4 cm 2 cm depth, 80% granulation wound vac placed post operatively.  I gave him a prescription for tramadol 1 q8 PRN for pain and wound vac changes.  He was instructed to take tylenol PRN as well when his pain was mild.    He will f/u in 3 weeks for wound check.  If he has problems or concerns they will call sooner.   Roxy Horseman , PA-C [facility] [phone]

## 2018-01-08 ENCOUNTER — Encounter (HOSPITAL_COMMUNITY): Payer: Self-pay | Admitting: *Deleted

## 2018-01-08 ENCOUNTER — Other Ambulatory Visit: Payer: Self-pay

## 2018-01-08 NOTE — Progress Notes (Signed)
Spoke with pt's wife, Adam [name] for pre-op call. She was in the car with pt, he was driving. She states nothing has changed with allergies, medications and medical history. Pt did have surgery on his leg on the 25th of August and now has a wound VAC. She states it healing well.

## 2018-01-09 DIAGNOSIS — D631 Anemia in chronic kidney disease: Secondary | ICD-10-CM | POA: Diagnosis not present

## 2018-01-09 DIAGNOSIS — T8189XD Other complications of procedures, not elsewhere classified, subsequent encounter: Secondary | ICD-10-CM | POA: Diagnosis not present

## 2018-01-09 DIAGNOSIS — I480 Paroxysmal atrial fibrillation: Secondary | ICD-10-CM | POA: Diagnosis not present

## 2018-01-09 DIAGNOSIS — I9589 Other hypotension: Secondary | ICD-10-CM | POA: Diagnosis not present

## 2018-01-09 DIAGNOSIS — N186 End stage renal disease: Secondary | ICD-10-CM | POA: Diagnosis not present

## 2018-01-09 DIAGNOSIS — I080 Rheumatic disorders of both mitral and aortic valves: Secondary | ICD-10-CM | POA: Diagnosis not present

## 2018-01-09 DIAGNOSIS — E1129 Type 2 diabetes mellitus with other diabetic kidney complication: Secondary | ICD-10-CM | POA: Diagnosis not present

## 2018-01-09 DIAGNOSIS — Z992 Dependence on renal dialysis: Secondary | ICD-10-CM | POA: Diagnosis not present

## 2018-01-09 DIAGNOSIS — I255 Ischemic cardiomyopathy: Secondary | ICD-10-CM | POA: Diagnosis not present

## 2018-01-09 DIAGNOSIS — I272 Pulmonary hypertension, unspecified: Secondary | ICD-10-CM | POA: Diagnosis not present

## 2018-01-09 DIAGNOSIS — I120 Hypertensive chronic kidney disease with stage 5 chronic kidney disease or end stage renal disease: Secondary | ICD-10-CM | POA: Diagnosis not present

## 2018-01-11 ENCOUNTER — Other Ambulatory Visit: Payer: Self-pay | Admitting: Family Medicine

## 2018-01-11 DIAGNOSIS — Z23 Encounter for immunization: Secondary | ICD-10-CM | POA: Diagnosis not present

## 2018-01-11 DIAGNOSIS — I9589 Other hypotension: Secondary | ICD-10-CM | POA: Diagnosis not present

## 2018-01-11 DIAGNOSIS — D631 Anemia in chronic kidney disease: Secondary | ICD-10-CM | POA: Diagnosis not present

## 2018-01-11 DIAGNOSIS — I480 Paroxysmal atrial fibrillation: Secondary | ICD-10-CM | POA: Diagnosis not present

## 2018-01-11 DIAGNOSIS — I120 Hypertensive chronic kidney disease with stage 5 chronic kidney disease or end stage renal disease: Secondary | ICD-10-CM | POA: Diagnosis not present

## 2018-01-11 DIAGNOSIS — I080 Rheumatic disorders of both mitral and aortic valves: Secondary | ICD-10-CM | POA: Diagnosis not present

## 2018-01-11 DIAGNOSIS — N2581 Secondary hyperparathyroidism of renal origin: Secondary | ICD-10-CM | POA: Diagnosis not present

## 2018-01-11 DIAGNOSIS — R52 Pain, unspecified: Secondary | ICD-10-CM | POA: Diagnosis not present

## 2018-01-11 DIAGNOSIS — I272 Pulmonary hypertension, unspecified: Secondary | ICD-10-CM | POA: Diagnosis not present

## 2018-01-11 DIAGNOSIS — T8189XD Other complications of procedures, not elsewhere classified, subsequent encounter: Secondary | ICD-10-CM | POA: Diagnosis not present

## 2018-01-11 DIAGNOSIS — N186 End stage renal disease: Secondary | ICD-10-CM | POA: Diagnosis not present

## 2018-01-11 DIAGNOSIS — D509 Iron deficiency anemia, unspecified: Secondary | ICD-10-CM | POA: Diagnosis not present

## 2018-01-11 DIAGNOSIS — I255 Ischemic cardiomyopathy: Secondary | ICD-10-CM | POA: Diagnosis not present

## 2018-01-12 ENCOUNTER — Encounter (HOSPITAL_COMMUNITY): Payer: Self-pay | Admitting: *Deleted

## 2018-01-12 ENCOUNTER — Encounter (HOSPITAL_COMMUNITY)
Admission: RE | Disposition: A | Discharge status: Home / Self Care | Payer: Self-pay | Source: Ambulatory Visit | Attending: General Surgery

## 2018-01-12 ENCOUNTER — Ambulatory Visit (HOSPITAL_COMMUNITY): Payer: Medicare HMO | Admitting: Vascular Surgery

## 2018-01-12 ENCOUNTER — Ambulatory Visit (HOSPITAL_COMMUNITY)
Admission: RE | Admit: 2018-01-12 | Discharge: 2018-01-12 | Disposition: A | Discharge status: Home / Self Care | Payer: Medicare HMO | Source: Ambulatory Visit | Attending: General Surgery | Admitting: General Surgery

## 2018-01-12 ENCOUNTER — Encounter: Payer: Self-pay | Admitting: *Deleted

## 2018-01-12 ENCOUNTER — Telehealth: Payer: Self-pay | Admitting: Pulmonary Disease

## 2018-01-12 ENCOUNTER — Inpatient Hospital Stay: Payer: Medicare HMO | Admitting: Family Medicine

## 2018-01-12 DIAGNOSIS — Z992 Dependence on renal dialysis: Secondary | ICD-10-CM | POA: Insufficient documentation

## 2018-01-12 DIAGNOSIS — I272 Pulmonary hypertension, unspecified: Secondary | ICD-10-CM | POA: Diagnosis not present

## 2018-01-12 DIAGNOSIS — I429 Cardiomyopathy, unspecified: Secondary | ICD-10-CM | POA: Diagnosis not present

## 2018-01-12 DIAGNOSIS — R1901 Right upper quadrant abdominal swelling, mass and lump: Secondary | ICD-10-CM | POA: Insufficient documentation

## 2018-01-12 DIAGNOSIS — M7989 Other specified soft tissue disorders: Secondary | ICD-10-CM | POA: Insufficient documentation

## 2018-01-12 DIAGNOSIS — R222 Localized swelling, mass and lump, trunk: Secondary | ICD-10-CM | POA: Diagnosis not present

## 2018-01-12 DIAGNOSIS — I4891 Unspecified atrial fibrillation: Secondary | ICD-10-CM | POA: Diagnosis not present

## 2018-01-12 DIAGNOSIS — Z7901 Long term (current) use of anticoagulants: Secondary | ICD-10-CM | POA: Insufficient documentation

## 2018-01-12 DIAGNOSIS — Z89412 Acquired absence of left great toe: Secondary | ICD-10-CM | POA: Insufficient documentation

## 2018-01-12 DIAGNOSIS — M170 Bilateral primary osteoarthritis of knee: Secondary | ICD-10-CM | POA: Insufficient documentation

## 2018-01-12 DIAGNOSIS — Z85850 Personal history of malignant neoplasm of thyroid: Secondary | ICD-10-CM | POA: Insufficient documentation

## 2018-01-12 DIAGNOSIS — L988 Other specified disorders of the skin and subcutaneous tissue: Secondary | ICD-10-CM | POA: Diagnosis not present

## 2018-01-12 DIAGNOSIS — I2510 Atherosclerotic heart disease of native coronary artery without angina pectoris: Secondary | ICD-10-CM | POA: Diagnosis not present

## 2018-01-12 DIAGNOSIS — E890 Postprocedural hypothyroidism: Secondary | ICD-10-CM | POA: Diagnosis not present

## 2018-01-12 DIAGNOSIS — Z95810 Presence of automatic (implantable) cardiac defibrillator: Secondary | ICD-10-CM | POA: Diagnosis not present

## 2018-01-12 DIAGNOSIS — N186 End stage renal disease: Secondary | ICD-10-CM | POA: Insufficient documentation

## 2018-01-12 DIAGNOSIS — E039 Hypothyroidism, unspecified: Secondary | ICD-10-CM | POA: Diagnosis not present

## 2018-01-12 HISTORY — PX: MASS EXCISION: SHX2000

## 2018-01-12 LAB — GLUCOSE, CAPILLARY
GLUCOSE-CAPILLARY: 53 mg/dL — AB (ref 70–99)
Glucose-Capillary: 75 mg/dL (ref 70–99)

## 2018-01-12 LAB — PROTIME-INR
INR: 1.21
Prothrombin Time: 15.2 seconds (ref 11.4–15.2)

## 2018-01-12 LAB — POCT I-STAT 4, (NA,K, GLUC, HGB,HCT)
GLUCOSE: 59 mg/dL — AB (ref 70–99)
HEMATOCRIT: 25 % — AB (ref 39.0–52.0)
Hemoglobin: 8.5 g/dL — ABNORMAL LOW (ref 13.0–17.0)
POTASSIUM: 3.9 mmol/L (ref 3.5–5.1)
Sodium: 134 mmol/L — ABNORMAL LOW (ref 135–145)

## 2018-01-12 SURGERY — EXCISION MASS
Anesthesia: Monitor Anesthesia Care | Site: Flank

## 2018-01-12 MED ORDER — ONDANSETRON HCL 4 MG/2ML IJ SOLN
INTRAMUSCULAR | Status: DC | PRN
  Administered 2018-01-12: 4 mg via INTRAVENOUS

## 2018-01-12 MED ORDER — DEXAMETHASONE SODIUM PHOSPHATE 10 MG/ML IJ SOLN
INTRAMUSCULAR | Status: DC | PRN
  Administered 2018-01-12: 10 mg via INTRAVENOUS

## 2018-01-12 MED ORDER — MIDAZOLAM HCL 5 MG/5ML IJ SOLN
INTRAMUSCULAR | Status: DC | PRN
  Administered 2018-01-12: 2 mg via INTRAVENOUS

## 2018-01-12 MED ORDER — FENTANYL CITRATE (PF) 250 MCG/5ML IJ SOLN
INTRAMUSCULAR | Status: AC
  Filled 2018-01-12: qty 5

## 2018-01-12 MED ORDER — CELECOXIB 200 MG PO CAPS
200.0000 mg | ORAL_CAPSULE | ORAL | Status: AC
  Administered 2018-01-12: 200 mg via ORAL
  Filled 2018-01-12: qty 1

## 2018-01-12 MED ORDER — BUPIVACAINE-EPINEPHRINE (PF) 0.25% -1:200000 IJ SOLN
INTRAMUSCULAR | Status: AC
  Filled 2018-01-12: qty 30

## 2018-01-12 MED ORDER — DEXTROSE 50 % IV SOLN
1.0000 | Freq: Once | INTRAVENOUS | Status: AC
  Administered 2018-01-12: 50 mL via INTRAVENOUS

## 2018-01-12 MED ORDER — DEXTROSE 5 % IV SOLN
INTRAVENOUS | Status: DC | PRN
  Administered 2018-01-12: 3 g via INTRAVENOUS

## 2018-01-12 MED ORDER — ACETAMINOPHEN 500 MG PO TABS
1000.0000 mg | ORAL_TABLET | ORAL | Status: AC
  Administered 2018-01-12: 1000 mg via ORAL
  Filled 2018-01-12: qty 2

## 2018-01-12 MED ORDER — ALBUMIN HUMAN 5 % IV SOLN
INTRAVENOUS | Status: AC
  Filled 2018-01-12: qty 250

## 2018-01-12 MED ORDER — PROPOFOL 10 MG/ML IV BOLUS
INTRAVENOUS | Status: AC
  Filled 2018-01-12: qty 20

## 2018-01-12 MED ORDER — TRAMADOL HCL 50 MG PO TABS: 50.0000 mg | ORAL_TABLET | Freq: Four times a day (QID) | ORAL | 0 refills | Status: DC | PRN

## 2018-01-12 MED ORDER — PHENYLEPHRINE 40 MCG/ML (10ML) SYRINGE FOR IV PUSH (FOR BLOOD PRESSURE SUPPORT)
PREFILLED_SYRINGE | INTRAVENOUS | Status: DC | PRN
  Administered 2018-01-12: 80 ug via INTRAVENOUS
  Administered 2018-01-12 (×2): 120 ug via INTRAVENOUS
  Administered 2018-01-12: 80 ug via INTRAVENOUS

## 2018-01-12 MED ORDER — CHLORHEXIDINE GLUCONATE CLOTH 2 % EX PADS: 6.0000 | MEDICATED_PAD | Freq: Once | CUTANEOUS | Status: DC

## 2018-01-12 MED ORDER — ALBUMIN HUMAN 5 % IV SOLN
INTRAVENOUS | Status: DC | PRN
  Administered 2018-01-12: 08:00:00 via INTRAVENOUS

## 2018-01-12 MED ORDER — PHENYLEPHRINE HCL 10 MG/ML IJ SOLN
INTRAMUSCULAR | Status: DC | PRN
  Administered 2018-01-12: 40 ug via INTRAVENOUS

## 2018-01-12 MED ORDER — MIDAZOLAM HCL 2 MG/2ML IJ SOLN
INTRAMUSCULAR | Status: AC
  Filled 2018-01-12: qty 2

## 2018-01-12 MED ORDER — GABAPENTIN 300 MG PO CAPS
300.0000 mg | ORAL_CAPSULE | ORAL | Status: AC
  Administered 2018-01-12: 300 mg via ORAL
  Filled 2018-01-12: qty 1

## 2018-01-12 MED ORDER — SODIUM CHLORIDE 0.9 % IV SOLN
INTRAVENOUS | Status: DC | PRN
  Administered 2018-01-12: 08:00:00 via INTRAVENOUS

## 2018-01-12 MED ORDER — 0.9 % SODIUM CHLORIDE (POUR BTL) OPTIME
TOPICAL | Status: DC | PRN
  Administered 2018-01-12: 1000 mL

## 2018-01-12 MED ORDER — SUGAMMADEX SODIUM 200 MG/2ML IV SOLN
INTRAVENOUS | Status: DC | PRN
  Administered 2018-01-12: 200 mg via INTRAVENOUS

## 2018-01-12 MED ORDER — ALBUMIN HUMAN 5 % IV SOLN
12.5000 g | Freq: Once | INTRAVENOUS | Status: AC
  Administered 2018-01-12: 12.5 g via INTRAVENOUS

## 2018-01-12 MED ORDER — SUCCINYLCHOLINE CHLORIDE 20 MG/ML IJ SOLN
INTRAMUSCULAR | Status: DC | PRN
  Administered 2018-01-12: 90 mg via INTRAVENOUS

## 2018-01-12 MED ORDER — BUPIVACAINE-EPINEPHRINE 0.25% -1:200000 IJ SOLN
INTRAMUSCULAR | Status: DC | PRN
  Administered 2018-01-12: 20 mL

## 2018-01-12 MED ORDER — HEMOSTATIC AGENTS (NO CHARGE) OPTIME
TOPICAL | Status: DC | PRN
  Administered 2018-01-12 (×2): 1 via TOPICAL

## 2018-01-12 MED ORDER — ONDANSETRON HCL 4 MG/2ML IJ SOLN
INTRAMUSCULAR | Status: AC
  Filled 2018-01-12: qty 2

## 2018-01-12 MED ORDER — PROPOFOL 1000 MG/100ML IV EMUL
INTRAVENOUS | Status: AC
  Filled 2018-01-12: qty 100

## 2018-01-12 MED ORDER — DEXTROSE 50 % IV SOLN
INTRAVENOUS | Status: AC
  Filled 2018-01-12: qty 50

## 2018-01-12 MED ORDER — PROPOFOL 10 MG/ML IV BOLUS
INTRAVENOUS | Status: DC | PRN
  Administered 2018-01-12: 140 mg via INTRAVENOUS

## 2018-01-12 MED ORDER — SODIUM CHLORIDE 0.9 % IV SOLN
INTRAVENOUS | Status: DC | PRN
  Administered 2018-01-12: 60 ug/min via INTRAVENOUS

## 2018-01-12 MED ORDER — ROCURONIUM BROMIDE 10 MG/ML (PF) SYRINGE
PREFILLED_SYRINGE | INTRAVENOUS | Status: DC | PRN
  Administered 2018-01-12: 20 mg via INTRAVENOUS

## 2018-01-12 MED ORDER — LIDOCAINE 2% (20 MG/ML) 5 ML SYRINGE
INTRAMUSCULAR | Status: AC
  Filled 2018-01-12: qty 5

## 2018-01-12 MED ORDER — FENTANYL CITRATE (PF) 250 MCG/5ML IJ SOLN
INTRAMUSCULAR | Status: DC | PRN
  Administered 2018-01-12 (×2): 50 ug via INTRAVENOUS
  Administered 2018-01-12: 25 ug via INTRAVENOUS

## 2018-01-12 MED ORDER — EPHEDRINE 5 MG/ML INJ
INTRAVENOUS | Status: AC
  Filled 2018-01-12: qty 10

## 2018-01-12 MED ORDER — EPHEDRINE SULFATE 50 MG/ML IJ SOLN
INTRAMUSCULAR | Status: DC | PRN
  Administered 2018-01-12: 10 mg via INTRAVENOUS
  Administered 2018-01-12: 5 mg via INTRAVENOUS
  Administered 2018-01-12: 10 mg via INTRAVENOUS

## 2018-01-12 SURGICAL SUPPLY — 50 items
BENZOIN TINCTURE PRP APPL 2/3 (GAUZE/BANDAGES/DRESSINGS) IMPLANT
BLADE CLIPPER SURG (BLADE) IMPLANT
BLADE SURG 10 STRL SS (BLADE) ×2 IMPLANT
BLADE SURG 15 STRL LF DISP TIS (BLADE) ×1 IMPLANT
BLADE SURG 15 STRL SS (BLADE) ×1
CANISTER SUCT 3000ML PPV (MISCELLANEOUS) IMPLANT
CHLORAPREP W/TINT 26ML (MISCELLANEOUS) ×4 IMPLANT
COVER SURGICAL LIGHT HANDLE (MISCELLANEOUS) ×2 IMPLANT
DECANTER SPIKE VIAL GLASS SM (MISCELLANEOUS) ×2 IMPLANT
DERMABOND ADVANCED (GAUZE/BANDAGES/DRESSINGS) ×2
DERMABOND ADVANCED .7 DNX12 (GAUZE/BANDAGES/DRESSINGS) ×2 IMPLANT
DRAPE LAPAROTOMY 100X72 PEDS (DRAPES) IMPLANT
DRAPE ORTHO SPLIT 77X108 STRL (DRAPES) ×4
DRAPE SURG ORHT 6 SPLT 77X108 (DRAPES) ×4 IMPLANT
ELECT CAUTERY BLADE 6.4 (BLADE) ×2 IMPLANT
ELECT REM PT RETURN 9FT ADLT (ELECTROSURGICAL) ×2
ELECTRODE REM PT RTRN 9FT ADLT (ELECTROSURGICAL) ×1 IMPLANT
GAUZE SPONGE 4X4 12PLY STRL (GAUZE/BANDAGES/DRESSINGS) IMPLANT
GLOVE BIO SURGEON STRL SZ 6.5 (GLOVE) ×2 IMPLANT
GLOVE BIO SURGEON STRL SZ7.5 (GLOVE) ×2 IMPLANT
GLOVE BIOGEL PI IND STRL 8 (GLOVE) ×1 IMPLANT
GLOVE BIOGEL PI INDICATOR 8 (GLOVE) ×1
GOWN STRL REUS W/ TWL LRG LVL3 (GOWN DISPOSABLE) IMPLANT
GOWN STRL REUS W/ TWL XL LVL3 (GOWN DISPOSABLE) ×3 IMPLANT
GOWN STRL REUS W/TWL LRG LVL3 (GOWN DISPOSABLE)
GOWN STRL REUS W/TWL XL LVL3 (GOWN DISPOSABLE) ×3
HEMOSTAT ARISTA ABSORB 3G PWDR (MISCELLANEOUS) ×4 IMPLANT
KIT BASIN OR (CUSTOM PROCEDURE TRAY) ×2 IMPLANT
KIT TURNOVER KIT B (KITS) ×2 IMPLANT
MANIFOLD NEPTUNE WASTE (CANNULA) ×2 IMPLANT
NEEDLE HYPO 25GX1X1/2 BEV (NEEDLE) ×2 IMPLANT
NS IRRIG 1000ML POUR BTL (IV SOLUTION) ×2 IMPLANT
PACK SURGICAL SETUP 50X90 (CUSTOM PROCEDURE TRAY) ×2 IMPLANT
PAD ARMBOARD 7.5X6 YLW CONV (MISCELLANEOUS) ×6 IMPLANT
PENCIL BUTTON HOLSTER BLD 10FT (ELECTRODE) IMPLANT
PENCIL SMOKE EVACUATOR (MISCELLANEOUS) ×2 IMPLANT
SPECIMEN JAR LARGE (MISCELLANEOUS) ×2 IMPLANT
SPECIMEN JAR SMALL (MISCELLANEOUS) IMPLANT
SPONGE LAP 18X18 X RAY DECT (DISPOSABLE) ×4 IMPLANT
STRIP CLOSURE SKIN 1/2X4 (GAUZE/BANDAGES/DRESSINGS) IMPLANT
SUT MNCRL AB 4-0 PS2 18 (SUTURE) ×4 IMPLANT
SUT SILK 2 0 (SUTURE) ×1
SUT SILK 2-0 18XBRD TIE 12 (SUTURE) ×1 IMPLANT
SUT VIC AB 3-0 SH 18 (SUTURE) ×6 IMPLANT
SYR BULB 3OZ (MISCELLANEOUS) ×2 IMPLANT
SYR CONTROL 10ML LL (SYRINGE) ×2 IMPLANT
TOWEL OR 17X24 6PK STRL BLUE (TOWEL DISPOSABLE) ×2 IMPLANT
TUBE CONNECTING 12X1/4 (SUCTIONS) ×2 IMPLANT
UNDERPAD 30X30 (UNDERPADS AND DIAPERS) IMPLANT
YANKAUER SUCT BULB TIP NO VENT (SUCTIONS) ×2 IMPLANT

## 2018-01-12 NOTE — Anesthesia Preprocedure Evaluation (Signed)
Anesthesia Evaluation  Patient identified by MRN, date of birth, ID band Patient awake    Reviewed: Allergy & Precautions, NPO status , Patient's Chart, lab work & pertinent test results  History of Anesthesia Complications Negative for: history of anesthetic complications  Airway Mallampati: II  TM Distance: >3 FB Neck ROM: Full    Dental  (+) Teeth Intact, Dental Advisory Given   Pulmonary    breath sounds clear to auscultation       Cardiovascular hypertension, Pt. on medications + Peripheral Vascular Disease  (-) Cardiac Defibrillator  Rhythm:Regular     Neuro/Psych negative neurological ROS  negative psych ROS   GI/Hepatic negative GI ROS, Neg liver ROS,   Endo/Other  Hypothyroidism Morbid obesity  Renal/GU Dialysis and ESRFRenal diseaseLast dialysis 9/2 without issues     Musculoskeletal  (+) Arthritis ,   Abdominal   Peds  Hematology  (+) anemia ,   Anesthesia Other Findings Hx thyroidectomy  Reproductive/Obstetrics                             Anesthesia Physical Anesthesia Plan  ASA: III  Anesthesia Plan: MAC   Post-op Pain Management:    Induction: Intravenous  PONV Risk Score and Plan: 1 and Treatment may vary due to age or medical condition and Ondansetron  Airway Management Planned: Nasal Cannula  Additional Equipment: None  Intra-op Plan:   Post-operative Plan:   Informed Consent: I have reviewed the patients History and Physical, chart, labs and discussed the procedure including the risks, benefits and alternatives for the proposed anesthesia with the patient or authorized representative who has indicated his/her understanding and acceptance.   Dental advisory given  Plan Discussed with: Surgeon  Anesthesia Plan Comments:         Anesthesia Quick Evaluation

## 2018-01-12 NOTE — H&P (Signed)
Adam Ocain. is an 57 y.o. male.   Chief Complaint: SQ masses   HPI: 57 y/o M with mult (3) SQ masses.  Pt has had these for mult year and havebecome painful.  Pt on Eliquis and cleared to be off x5d.  Pt also with ESRD and on HD.  Past Medical History:  Diagnosis Date  . AICD (automatic cardioverter/defibrillator) present    [employer] - s/p extraction 08/21/17 due to staph infection  . Anemia   . Aortic stenosis    moderate AS by 08/2016 echo  . Arthritis    "knees" (08/05/2016)  . Atrial fibrillation ([place])   . ESRD (end stage renal disease) on dialysis ([place])    "M, T, W, T, F; NX stage hemodialysis; I do it at home" (08/05/2016)  . Great toe amputation status ([place])    status post left hallux amputation 08/01/14  . History of blood transfusion 2009   "S/P biopsy for prostate cancer check"  . Hypothyroidism (acquired)   . Mitral stenosis    moderate mitral stenosis  . Nonhealing surgical wound    left thigh  . Nonischemic cardiomyopathy ([place])   . Pneumonia 2014; 08/05/2016  . Pulmonary HTN ([place])   . Renal insufficiency   . Thyroid cancer ([place]) 2011    Past Surgical History:  Procedure Laterality Date  . ARTERIAL LINE INSERTION Right 08/24/2017   Procedure: ARTERIAL LINE INSERTION INTO RIGHT FEMORAL ARTERY;  Surgeon: Conrad Munfordville, MD;  Location: [place];  Service: Vascular;  Laterality: Right;  . BASCILIC VEIN TRANSPOSITION Right 10/13/2017   Procedure: FIRST STAGE BRACHIAL VEIN TRANSPOSITION RIGHT ARM;  Surgeon: Conrad Richmond Dale, MD;  Location: [place];  Service: Vascular;  Laterality: Right;  . BIOPSY  09/25/2017   Procedure: BIOPSY;  Surgeon: Ronnette Juniper, MD;  Location: [facility] ENDOSCOPY;  Service: Gastroenterology;;  . CARDIAC CATHETERIZATION Right 2017  . CARDIAC DEFIBRILLATOR PLACEMENT  2009  . DIALYSIS FISTULA CREATION Left 2003   "upper arm"  . DIALYSIS/PERMA CATHETER REMOVAL Left 09/01/2017   Procedure: DIALYSIS/PERMA CATHETER REMOVAL;  Surgeon: Conrad Seabrook, MD;  Location:  [place];  Service: Vascular;  Laterality: Left;  . ESOPHAGOGASTRODUODENOSCOPY (EGD) WITH PROPOFOL N/A 09/25/2017   Procedure: ESOPHAGOGASTRODUODENOSCOPY (EGD) WITH PROPOFOL;  Surgeon: Ronnette Juniper, MD;  Location: [place];  Service: Gastroenterology;  Laterality: N/A;  . HERNIA REPAIR    . I&D EXTREMITY Right 11/06/2017   Procedure: IRRIGATION AND DEBRIDEMENT KNEE;  Surgeon: Rod Can, MD;  Location: [place];  Service: Orthopedics;  Laterality: Right;  . I&D EXTREMITY Left 01/03/2018   Procedure: IRRIGATION AND DEBRIDEMENT OF THIGH, wound vac application  wound size 10cm x 2.5cm x 2.5cm;  Surgeon: Waynetta Sandy, MD;  Location: [place];  Service: Vascular;  Laterality: Left;  . ICD LEAD REMOVAL Right 08/20/2017   Procedure: ICD EXTRACTION WITH C-ARM;  Surgeon: Evans Lance, MD;  Location: [facility] OR;  Service: Cardiovascular;  Laterality: Right;  DR VAN TRIGT TO BACK-UP  . icd removed    . INSERTION OF DIALYSIS CATHETER N/A 11/11/2016   Procedure: INSERTION OF TUNNELED DIALYSIS CATHETER;  Surgeon: Angelia Mould, MD;  Location: [place];  Service: Vascular;  Laterality: N/A;  . INSERTION OF DIALYSIS CATHETER Left 08/24/2017   Procedure: INSERTION OF DIALYSIS CATHETER;  Surgeon: Conrad Uvalde, MD;  Location: [place];  Service: Vascular;  Laterality: Left;  . INSERTION OF DIALYSIS CATHETER Left 09/01/2017   Procedure: INSERTION OF DIALYSIS CATHETER;  Surgeon: Bridgett Larsson,  Jannette Fogo, MD;  Location: [place];  Service: Vascular;  Laterality: Left;  . KIDNEY TRANSPLANT  2009  . KNEE SURGERY Bilateral 2000s   "drained fluid"  . LAPAROSCOPIC GASTRIC BANDING  2006  . LIGATION OF ARTERIOVENOUS  FISTULA Right 11/10/2017   Procedure: LIGATION OF BRACHIAL VEIN TRANSPOSITION RIGHT ARM;  Surgeon: Angelia Mould, MD;  Location: [place];  Service: Vascular;  Laterality: Right;  . PROSTATE BIOPSY  2009  . REVISON OF ARTERIOVENOUS FISTULA Left 0/12/6759   Procedure: PLICATION OF ARTERIOVENOUS FISTULA ANEURYSM;   Surgeon: Elam Dutch, MD;  Location: [place];  Service: Vascular;  Laterality: Left;  . REVISON OF ARTERIOVENOUS FISTULA Left 11/10/2016   Procedure: REVISON OF ARTERIOVENOUS FISTULA - LEFT UPPER ARM;  Surgeon: Elam Dutch, MD;  Location: [place];  Service: Vascular;  Laterality: Left;  . REVISON OF ARTERIOVENOUS FISTULA Left 08/24/2017   Procedure: REVISON OF ARTERIOVENOUS FISTULA LEFT;  Surgeon: Conrad Magalia, MD;  Location: [place];  Service: Vascular;  Laterality: Left;  . TEE WITHOUT CARDIOVERSION N/A 08/20/2017   Procedure: TRANSESOPHAGEAL ECHOCARDIOGRAM (TEE);  Surgeon: Evans Lance, MD;  Location: [place];  Service: Cardiovascular;  Laterality: N/A;  . THROMBECTOMY W/ EMBOLECTOMY Left 12/25/2014   Procedure: THROMBECTOMY ARTERIOVENOUS FISTULA;  Surgeon: Elam Dutch, MD;  Location: [place];  Service: Vascular;  Laterality: Left;  . THYROIDECTOMY  2011  . TOE AMPUTATION Bilateral     right 1st and  2nd digits; left 1st and 3rd digits"  . UMBILICAL HERNIA REPAIR  X 2  . UPPER EXTREMITY VENOGRAPHY Right 11/05/2017   Procedure: UPPER EXTREMITY VENOGRAPHY;  Surgeon: Conrad Malakoff, MD;  Location: [place] CV LAB;  Service: Cardiovascular;  Laterality: Right;  Marland Kitchen VASCULAR ACCESS DEVICE INSERTION Left 11/10/2017   Procedure: INSERTION OF SUPER HERO VASCULAR ACCESS DEVICE LEFT ARM;  Surgeon: Angelia Mould, MD;  Location: [place];  Service: Vascular;  Laterality: Left;  Marland Kitchen VEIN HARVEST Left 08/24/2017   Procedure: VEIN HARVEST;  Surgeon: Conrad Oneonta, MD;  Location: [place];  Service: Vascular;  Laterality: Left;  . WOUND DEBRIDEMENT Left 12/22/2017   Procedure: EXCISION OF LEFT THIGH WOUND;  Surgeon: Elam Dutch, MD;  Location: [facility] OR;  Service: Vascular;  Laterality: Left;    Family History  Problem Relation Age of Onset  . Heart failure Mother   . Hypertension Mother   . CAD Mother 26  . Emphysema Mother   . Hypertension Father   . Kidney failure Father    Social History:   reports that he has never smoked. He has never used smokeless tobacco. He reports that he drinks alcohol. He reports that he does not use drugs.  Allergies:  Allergies  Allergen Reactions  . Enalapril Swelling and Other (See Comments)    MOUTH SWELLING EDEMA  . Iodinated Diagnostic Agents Nausea And Vomiting and Nausea Only    Medications Prior to Admission  Medication Sig Dispense Refill  . ambrisentan (LETAIRIS) 10 MG tablet Take 1 tablet (10 mg total) by mouth daily. 30 tablet 11  . amiodarone (PACERONE) 200 MG tablet Take 200 mg by mouth daily.     Marland Kitchen atorvastatin (LIPITOR) 40 MG tablet Take 1 tablet (40 mg total) by mouth every evening. 30 tablet 2  . cinacalcet (SENSIPAR) 90 MG tablet Take 90 mg by mouth every evening.    Marland Kitchen levothyroxine (SYNTHROID, LEVOTHROID) 300 MCG tablet Take 1 tablet (300 mcg total) by mouth daily before  breakfast. 90 tablet 3  . midodrine (PROAMATINE) 10 MG tablet TAKE 1 TABLET (10 MG TOTAL) BY MOUTH 3 (THREE) TIMES DAILY. 90 tablet 5  . multivitamin (RENA-VIT) TABS tablet TAKE 1 TABLET BY MOUTH EVERYDAY AT BEDTIME (Patient taking differently: Take 1 tablet by mouth at bedtime. ) 30 tablet 0  . nystatin (MYCOSTATIN/NYSTOP) powder Apply topically 4 (four) times daily. (Patient taking differently: Apply 1 g topically daily. ) 60 g 0  . tadalafil (ADCIRCA/CIALIS) 20 MG tablet Take 20 mg by mouth daily.     . traMADol (ULTRAM) 50 MG tablet Take 1 tablet (50 mg total) by mouth every 8 (eight) hours as needed for severe pain. 30 tablet 0  . apixaban (ELIQUIS) 2.5 MG TABS tablet Take 1 tablet (2.5 mg total) by mouth 2 (two) times daily. 60 tablet 0    Results for orders placed or performed during the hospital encounter of 01/12/18 (from the past 48 hour(s))  I-STAT 4, (NA,K, GLUC, HGB,HCT)     Status: Abnormal   Collection Time: 01/12/18  6:43 AM  Result Value Ref Range   Sodium 134 (L) 135 - 145 mmol/L   Potassium 3.9 3.5 - 5.1 mmol/L   Glucose, Bld 59 (L) 70 -  99 mg/dL   HCT 25.0 (L) 39.0 - 52.0 %   Hemoglobin 8.5 (L) 13.0 - 17.0 g/dL   No results found.  Review of Systems  Constitutional: Negative for chills, fever and malaise/fatigue.  HENT: Negative for ear discharge, hearing loss and sore throat.   Eyes: Negative for blurred vision and discharge.  Respiratory: Negative for cough and shortness of breath.   Cardiovascular: Negative for chest pain, orthopnea and leg swelling.  Gastrointestinal: Negative for abdominal pain, constipation, diarrhea, heartburn, nausea and vomiting.  Musculoskeletal: Negative for myalgias and neck pain.  Skin: Negative for itching and rash.  Neurological: Negative for dizziness, focal weakness, seizures and loss of consciousness.  Endo/Heme/Allergies: Negative for environmental allergies. Does not bruise/bleed easily.  Psychiatric/Behavioral: Negative for depression and suicidal ideas.  All other systems reviewed and are negative.   Blood pressure 90/61, pulse 83, temperature 97.6 F (36.4 C), temperature source Oral, resp. rate 18, SpO2 99 %. Physical Exam  Constitutional: He is oriented to person, place, and time. Vital signs are normal. He appears well-developed and well-nourished.  Conversant No acute distress  Eyes: Lids are normal. No scleral icterus.  No lid lag Moist conjunctiva  Neck: No tracheal tenderness present. No thyromegaly present.  No cervical lymphadenopathy  Cardiovascular: Normal rate, regular rhythm and intact distal pulses.  No murmur heard. Respiratory: Effort normal and breath sounds normal. He has no wheezes. He has no rales.  GI: There is no hepatosplenomegaly. There is no tenderness. No hernia.  Neurological: He is alert and oriented to person, place, and time.  Normal gait and station  Skin: Skin is warm. No rash noted. No cyanosis. Nails show no clubbing.     Normal skin turgor  Psychiatric: Judgment normal.  Appropriate affect     Assessment/Plan 57 y/o M with SQ  masses x 3. To OR for excision of masses. All risks and benefits were discussed with the patient to generally include: infection, bleeding, possible need for further surgery. Alternatives were offered and described.  All questions were answered and the patient voiced understanding of the procedure and wishes to proceed at this point with a laparoscopic cholecystectomy   Reyes Ivan, MD 01/12/2018, 7:22 AM

## 2018-01-12 NOTE — Anesthesia Procedure Notes (Signed)
Procedure Name: Intubation Date/Time: 01/12/2018 7:45 AM Performed by: Marsa Aris, CRNA Pre-anesthesia Checklist: Patient identified, Emergency Drugs available, Suction available and Patient being monitored Patient Re-evaluated:Patient Re-evaluated prior to induction Oxygen Delivery Method: Circle System Utilized Preoxygenation: Pre-oxygenation with 100% oxygen Induction Type: IV induction Ventilation: Mask ventilation without difficulty Laryngoscope Size: Miller and 2 Grade View: Grade I Tube type: Oral Tube size: 7.5 mm Number of attempts: 1 Airway Equipment and Method: Stylet and Oral airway Placement Confirmation: ETT inserted through vocal cords under direct vision,  positive ETCO2 and breath sounds checked- equal and bilateral Secured at: 22 cm Tube secured with: Tape Dental Injury: Teeth and Oropharynx as per pre-operative assessment

## 2018-01-12 NOTE — Progress Notes (Signed)
Notified Dr. Ermalene Postin of pt's blood sugar of 59. Pt. Is not diabetic and denies any symptoms of hypoglycemia. No new orders. CRNA also aware.

## 2018-01-12 NOTE — Discharge Instructions (Signed)
Lipoma Removal, Care After Refer to this sheet in the next few weeks. These instructions provide you with information about caring for yourself after your procedure. Your health care provider may also give you more specific instructions. Your treatment has been planned according to current medical practices, but problems sometimes occur. Call your health care provider if you have any problems or questions after your procedure. What can I expect after the procedure? After the procedure, it is common to have:  Mild pain.  Swelling.  Bruising.  Follow these instructions at home:  Bathing  Do not take baths, swim, or use a hot tub until your health care provider approves. Ask your health care provider if you can take showers. You may only be allowed to take sponge baths for bathing.  Keep your bandage (dressing) dry until your health care provider says it can be removed. Incision care   Follow instructions from your health care provider about how to take care of your incision. Make sure you: ? Wash your hands with soap and water before you change your bandage (dressing). If soap and water are not available, use hand sanitizer. ? Change your dressing as told by your health care provider. ? Leave stitches (sutures), skin glue, or adhesive strips in place. These skin closures may need to stay in place for 2 weeks or longer. If adhesive strip edges start to loosen and curl up, you may trim the loose edges. Do not remove adhesive strips completely unless your health care provider tells you to do that.  Check your incision area every day for signs of infection. Check for: ? More redness, swelling, or pain. ? Fluid or blood. ? Warmth. ? Pus or a bad smell. Driving  Do not drive or operate heavy machinery while taking prescription pain medicine.  Do not drive for 24 hours if you received a medicine to help you relax (sedative) during your procedure.  Ask your health care provider when it is  safe for you to drive. General instructions  Take over-the-counter and prescription medicines only as told by your health care provider.  Do not use any tobacco products, such as cigarettes, chewing tobacco, and e-cigarettes. These can delay healing. If you need help quitting, ask your health care provider.  Return to your normal activities as told by your health care provider. Ask your health care provider what activities are safe for you.  Keep all follow-up visits as told by your health care provider. This is important. Contact a health care provider if:  You have more redness, swelling, or pain around your incision.  You have fluid or blood coming from your incision.  Your incision feels warm to the touch.  You have pus or a bad smell coming from your incision.  You have pain that does not get better with medicine. Get help right away if:  You have chills or a fever.  You have severe pain. This information is not intended to replace advice given to you by your health care provider. Make sure you discuss any questions you have with your health care provider. Document Released: 07/12/2015 Document Revised: 10/09/2015 Document Reviewed: 07/12/2015 Elsevier Interactive Patient Education  2018 [facility] Care Instructions  Activity: Get plenty of rest for the remainder of the day. A responsible individual must stay with you for 24 hours following the procedure.  For the next 24 hours, DO NOT: -Drive a car -Paediatric nurse -Drink alcoholic beverages -Take any medication unless  instructed by your physician -Make any legal decisions or sign important papers.  Meals: Start with liquid foods such as gelatin or soup. Progress to regular foods as tolerated. Avoid greasy, spicy, heavy foods. If nausea and/or vomiting occur, drink only clear liquids until the nausea and/or vomiting subsides. Call your physician if vomiting continues.  Special  Instructions/Symptoms: Your throat may feel dry or sore from the anesthesia or the breathing tube placed in your throat during surgery. If this causes discomfort, gargle with warm salt water. The discomfort should disappear within 24 hours.  If you had a scopolamine patch placed behind your ear for the management of post- operative nausea and/or vomiting:  1. The medication in the patch is effective for 72 hours, after which it should be removed.  Wrap patch in a tissue and discard in the trash. Wash hands thoroughly with soap and water. 2. You may remove the patch earlier than 72 hours if you experience unpleasant side effects which may include dry mouth, dizziness or visual disturbances. 3. Avoid touching the patch. Wash your hands with soap and water after contact with the patch.    Post Anesthesia Home Care Instructions  Activity: Get plenty of rest for the remainder of the day. A responsible individual must stay with you for 24 hours following the procedure.  For the next 24 hours, DO NOT: -Drive a car -Paediatric nurse -Drink alcoholic beverages -Take any medication unless instructed by your physician -Make any legal decisions or sign important papers.  Meals: Start with liquid foods such as gelatin or soup. Progress to regular foods as tolerated. Avoid greasy, spicy, heavy foods. If nausea and/or vomiting occur, drink only clear liquids until the nausea and/or vomiting subsides. Call your physician if vomiting continues.  Special Instructions/Symptoms: Your throat may feel dry or sore from the anesthesia or the breathing tube placed in your throat during surgery. If this causes discomfort, gargle with warm salt water. The discomfort should disappear within 24 hours.  If you had a scopolamine patch placed behind your ear for the management of post- operative nausea and/or vomiting:  1. The medication in the patch is effective for 72 hours, after which it should be removed.  Wrap  patch in a tissue and discard in the trash. Wash hands thoroughly with soap and water. 2. You may remove the patch earlier than 72 hours if you experience unpleasant side effects which may include dry mouth, dizziness or visual disturbances. 3. Avoid touching the patch. Wash your hands with soap and water after contact with the patch.

## 2018-01-12 NOTE — Transfer of Care (Signed)
Immediate Anesthesia Transfer of Care Note  Patient: Adam [name].  Procedure(s) Performed: EXCISION OF MULTIPLE SUBCUTANEOUS MASSES (N/A Flank)  Patient Location: PACU  Anesthesia Type:General  Level of Consciousness: awake, alert  and oriented  Airway & Oxygen Therapy: Patient Spontanous Breathing and Patient connected to face mask oxygen  Post-op Assessment: Report given to RN and Post -op Vital signs reviewed and stable  Post vital signs: Reviewed and stable  Last Vitals:  Vitals Value Taken Time  BP 95/63 01/12/2018  9:47 AM  Temp    Pulse 74 01/12/2018  9:53 AM  Resp 15 01/12/2018  9:53 AM  SpO2 100 % 01/12/2018  9:53 AM  Vitals shown include unvalidated device data.  Last Pain:  Vitals:   01/12/18 9597  TempSrc: Oral  PainSc: 0-No pain         Complications: No apparent anesthesia complications

## 2018-01-12 NOTE — Anesthesia Postprocedure Evaluation (Signed)
Anesthesia Post Note  Patient: Adam [name].  Procedure(s) Performed: EXCISION OF MULTIPLE SUBCUTANEOUS MASSES (N/A Flank)     Patient location during evaluation: PACU Anesthesia Type: MAC Level of consciousness: awake and alert Pain management: pain level controlled Vital Signs Assessment: post-procedure vital signs reviewed and stable Respiratory status: spontaneous breathing, nonlabored ventilation, respiratory function stable and patient connected to nasal cannula oxygen Cardiovascular status: blood pressure returned to baseline and stable Postop Assessment: no apparent nausea or vomiting Anesthetic complications: no    Last Vitals:  Vitals:   01/12/18 1050 01/12/18 1115  BP:  (!) 87/77  Pulse:  68  Resp:    Temp: (!) 36.3 C   SpO2:  100%    Last Pain:  Vitals:   01/12/18 1115  TempSrc:   PainSc: 0-No pain                 Dalicia Kisner,Hakim COKER

## 2018-01-12 NOTE — Telephone Encounter (Signed)
PA started via [place]: [place].  PA approved from 01/12/18  Through 01/13/2019. Pt is aware and [facility] to fill Rx. Nothing more needed at this time.

## 2018-01-12 NOTE — Op Note (Signed)
01/12/2018  9:19 AM  PATIENT:  Adam [name].  57 y.o. male  PRE-OPERATIVE DIAGNOSIS:  MULTIPLE MASSESx3  POST-OPERATIVE DIAGNOSIS:  MULTIPLE MASSESx3  PROCEDURE:  Procedure(s): EXCISION OF MULTIPLE SUBCUTANEOUS MASSES x3 1) 3x2cnm 2) 7x5cm 3) 6x8 cm  SURGEON:  Surgeon(s) and Role:    Ralene Ok, MD - Primary   ANESTHESIA:   local and general  EBL:  200 mL   BLOOD ADMINISTERED:none  DRAINS: none   LOCAL MEDICATIONS USED:  BUPIVICAINE   SPECIMEN:  Source of Specimen:  left lateral : 1) 3x2cm;   Right masses 2) 7x5cm 3) 6x8 cm  DISPOSITION OF SPECIMEN:  PATHOLOGY  COUNTS:  YES  TOURNIQUET:  * No tourniquets in log *  DICTATION: .Dragon Dictation  After the patient was consented she was taken to OR and placed in supine position with bilateral SCDs in place.  He underwent GETA. He was in place in the prone position. Patient was prepped and draped in standard fashion. Timeout was called all facts verified.  Quarter percent Marcaine with epinephrine was then used to create a field block around each of the masses.  The two right sided lateral masses were first excised. A linear incision was made over the masses.  They were individually dissected away from the SQ tissue.  Large skin venous vessels were suture ligated.  The areas were check for hemostasis.  Arixta was placed in the [place] sites to help with hemostasis. 3-0 vicryls were used to reapproximate the deep dermal layers and 4-0 monocryl was used to reapproximate the skin.  The areas were dressed with Dermabond.  The excised masses measured:  7x5cm & 6x8 cm  The left sided lateral mass was excised. A linear incision was made over the masses.  They were individually dissected away from the SQ tissue.  Large skin venous vessels were suture ligated.  The areas were check for hemostasis.  Arixta was placed in the [place] sites to help with hemostasis. 3-0 vicryls were used to reapproximate the deep dermal  layers and 4-0 monocryl was used to reapproximate the skin.  The areas were dressed with Dermabond.  The excised masses measured: 3x2cm  The patient tied the procedure well was taken to recovery room stable condition.   PLAN OF CARE: Discharge to home after PACU  PATIENT DISPOSITION:  PACU - hemodynamically stable.   Delay start of Pharmacological VTE agent (>24hrs) due to surgical blood loss or risk of bleeding: not applicable

## 2018-01-13 ENCOUNTER — Encounter (HOSPITAL_COMMUNITY): Payer: Self-pay | Admitting: General Surgery

## 2018-01-13 DIAGNOSIS — I9589 Other hypotension: Secondary | ICD-10-CM | POA: Diagnosis not present

## 2018-01-13 DIAGNOSIS — D509 Iron deficiency anemia, unspecified: Secondary | ICD-10-CM | POA: Diagnosis not present

## 2018-01-13 DIAGNOSIS — I272 Pulmonary hypertension, unspecified: Secondary | ICD-10-CM | POA: Diagnosis not present

## 2018-01-13 DIAGNOSIS — N2581 Secondary hyperparathyroidism of renal origin: Secondary | ICD-10-CM | POA: Diagnosis not present

## 2018-01-13 DIAGNOSIS — T8189XD Other complications of procedures, not elsewhere classified, subsequent encounter: Secondary | ICD-10-CM | POA: Diagnosis not present

## 2018-01-13 DIAGNOSIS — I480 Paroxysmal atrial fibrillation: Secondary | ICD-10-CM | POA: Diagnosis not present

## 2018-01-13 DIAGNOSIS — I255 Ischemic cardiomyopathy: Secondary | ICD-10-CM | POA: Diagnosis not present

## 2018-01-13 DIAGNOSIS — N186 End stage renal disease: Secondary | ICD-10-CM | POA: Diagnosis not present

## 2018-01-13 DIAGNOSIS — I080 Rheumatic disorders of both mitral and aortic valves: Secondary | ICD-10-CM | POA: Diagnosis not present

## 2018-01-13 DIAGNOSIS — I120 Hypertensive chronic kidney disease with stage 5 chronic kidney disease or end stage renal disease: Secondary | ICD-10-CM | POA: Diagnosis not present

## 2018-01-13 DIAGNOSIS — R52 Pain, unspecified: Secondary | ICD-10-CM | POA: Diagnosis not present

## 2018-01-13 DIAGNOSIS — D631 Anemia in chronic kidney disease: Secondary | ICD-10-CM | POA: Diagnosis not present

## 2018-01-13 DIAGNOSIS — Z23 Encounter for immunization: Secondary | ICD-10-CM | POA: Diagnosis not present

## 2018-01-14 DIAGNOSIS — A419 Sepsis, unspecified organism: Secondary | ICD-10-CM | POA: Diagnosis not present

## 2018-01-14 DIAGNOSIS — Z95810 Presence of automatic (implantable) cardiac defibrillator: Secondary | ICD-10-CM | POA: Diagnosis not present

## 2018-01-14 DIAGNOSIS — T8189XA Other complications of procedures, not elsewhere classified, initial encounter: Secondary | ICD-10-CM | POA: Diagnosis not present

## 2018-01-14 DIAGNOSIS — J9601 Acute respiratory failure with hypoxia: Secondary | ICD-10-CM | POA: Diagnosis not present

## 2018-01-14 DIAGNOSIS — N186 End stage renal disease: Secondary | ICD-10-CM | POA: Diagnosis not present

## 2018-01-14 DIAGNOSIS — T829XXA Unspecified complication of cardiac and vascular prosthetic device, implant and graft, initial encounter: Secondary | ICD-10-CM | POA: Diagnosis not present

## 2018-01-14 DIAGNOSIS — R7881 Bacteremia: Secondary | ICD-10-CM | POA: Diagnosis not present

## 2018-01-14 DIAGNOSIS — E669 Obesity, unspecified: Secondary | ICD-10-CM | POA: Diagnosis not present

## 2018-01-14 DIAGNOSIS — T8130XA Disruption of wound, unspecified, initial encounter: Secondary | ICD-10-CM | POA: Diagnosis not present

## 2018-01-14 DIAGNOSIS — I6529 Occlusion and stenosis of unspecified carotid artery: Secondary | ICD-10-CM | POA: Diagnosis not present

## 2018-01-14 NOTE — Addendum Note (Signed)
Addendum  created 01/14/18 1826 by Oleta Mouse, MD   Intraprocedure Staff edited

## 2018-01-15 DIAGNOSIS — I272 Pulmonary hypertension, unspecified: Secondary | ICD-10-CM | POA: Diagnosis not present

## 2018-01-15 DIAGNOSIS — Z23 Encounter for immunization: Secondary | ICD-10-CM | POA: Diagnosis not present

## 2018-01-15 DIAGNOSIS — I080 Rheumatic disorders of both mitral and aortic valves: Secondary | ICD-10-CM | POA: Diagnosis not present

## 2018-01-15 DIAGNOSIS — R52 Pain, unspecified: Secondary | ICD-10-CM | POA: Diagnosis not present

## 2018-01-15 DIAGNOSIS — N2581 Secondary hyperparathyroidism of renal origin: Secondary | ICD-10-CM | POA: Diagnosis not present

## 2018-01-15 DIAGNOSIS — D509 Iron deficiency anemia, unspecified: Secondary | ICD-10-CM | POA: Diagnosis not present

## 2018-01-15 DIAGNOSIS — T8189XD Other complications of procedures, not elsewhere classified, subsequent encounter: Secondary | ICD-10-CM | POA: Diagnosis not present

## 2018-01-15 DIAGNOSIS — I9589 Other hypotension: Secondary | ICD-10-CM | POA: Diagnosis not present

## 2018-01-15 DIAGNOSIS — I480 Paroxysmal atrial fibrillation: Secondary | ICD-10-CM | POA: Diagnosis not present

## 2018-01-15 DIAGNOSIS — N186 End stage renal disease: Secondary | ICD-10-CM | POA: Diagnosis not present

## 2018-01-15 DIAGNOSIS — I120 Hypertensive chronic kidney disease with stage 5 chronic kidney disease or end stage renal disease: Secondary | ICD-10-CM | POA: Diagnosis not present

## 2018-01-15 DIAGNOSIS — I255 Ischemic cardiomyopathy: Secondary | ICD-10-CM | POA: Diagnosis not present

## 2018-01-15 DIAGNOSIS — D631 Anemia in chronic kidney disease: Secondary | ICD-10-CM | POA: Diagnosis not present

## 2018-01-17 DIAGNOSIS — R0602 Shortness of breath: Secondary | ICD-10-CM

## 2018-01-17 DIAGNOSIS — I272 Pulmonary hypertension, unspecified: Secondary | ICD-10-CM

## 2018-01-18 DIAGNOSIS — I9589 Other hypotension: Secondary | ICD-10-CM | POA: Diagnosis not present

## 2018-01-18 DIAGNOSIS — R52 Pain, unspecified: Secondary | ICD-10-CM | POA: Diagnosis not present

## 2018-01-18 DIAGNOSIS — I255 Ischemic cardiomyopathy: Secondary | ICD-10-CM | POA: Diagnosis not present

## 2018-01-18 DIAGNOSIS — I272 Pulmonary hypertension, unspecified: Secondary | ICD-10-CM | POA: Diagnosis not present

## 2018-01-18 DIAGNOSIS — D631 Anemia in chronic kidney disease: Secondary | ICD-10-CM | POA: Diagnosis not present

## 2018-01-18 DIAGNOSIS — Z23 Encounter for immunization: Secondary | ICD-10-CM | POA: Diagnosis not present

## 2018-01-18 DIAGNOSIS — N186 End stage renal disease: Secondary | ICD-10-CM | POA: Diagnosis not present

## 2018-01-18 DIAGNOSIS — T8189XD Other complications of procedures, not elsewhere classified, subsequent encounter: Secondary | ICD-10-CM | POA: Diagnosis not present

## 2018-01-18 DIAGNOSIS — N2581 Secondary hyperparathyroidism of renal origin: Secondary | ICD-10-CM | POA: Diagnosis not present

## 2018-01-18 DIAGNOSIS — I480 Paroxysmal atrial fibrillation: Secondary | ICD-10-CM | POA: Diagnosis not present

## 2018-01-18 DIAGNOSIS — I120 Hypertensive chronic kidney disease with stage 5 chronic kidney disease or end stage renal disease: Secondary | ICD-10-CM | POA: Diagnosis not present

## 2018-01-18 DIAGNOSIS — D509 Iron deficiency anemia, unspecified: Secondary | ICD-10-CM | POA: Diagnosis not present

## 2018-01-18 DIAGNOSIS — I080 Rheumatic disorders of both mitral and aortic valves: Secondary | ICD-10-CM | POA: Diagnosis not present

## 2018-01-19 DIAGNOSIS — M25469 Effusion, unspecified knee: Secondary | ICD-10-CM | POA: Diagnosis not present

## 2018-01-19 DIAGNOSIS — M1712 Unilateral primary osteoarthritis, left knee: Secondary | ICD-10-CM | POA: Diagnosis not present

## 2018-01-20 DIAGNOSIS — D509 Iron deficiency anemia, unspecified: Secondary | ICD-10-CM | POA: Diagnosis not present

## 2018-01-20 DIAGNOSIS — I255 Ischemic cardiomyopathy: Secondary | ICD-10-CM | POA: Diagnosis not present

## 2018-01-20 DIAGNOSIS — I9589 Other hypotension: Secondary | ICD-10-CM | POA: Diagnosis not present

## 2018-01-20 DIAGNOSIS — I120 Hypertensive chronic kidney disease with stage 5 chronic kidney disease or end stage renal disease: Secondary | ICD-10-CM | POA: Diagnosis not present

## 2018-01-20 DIAGNOSIS — I080 Rheumatic disorders of both mitral and aortic valves: Secondary | ICD-10-CM | POA: Diagnosis not present

## 2018-01-20 DIAGNOSIS — I272 Pulmonary hypertension, unspecified: Secondary | ICD-10-CM | POA: Diagnosis not present

## 2018-01-20 DIAGNOSIS — N186 End stage renal disease: Secondary | ICD-10-CM | POA: Diagnosis not present

## 2018-01-20 DIAGNOSIS — N2581 Secondary hyperparathyroidism of renal origin: Secondary | ICD-10-CM | POA: Diagnosis not present

## 2018-01-20 DIAGNOSIS — D631 Anemia in chronic kidney disease: Secondary | ICD-10-CM | POA: Diagnosis not present

## 2018-01-20 DIAGNOSIS — T8189XD Other complications of procedures, not elsewhere classified, subsequent encounter: Secondary | ICD-10-CM | POA: Diagnosis not present

## 2018-01-20 DIAGNOSIS — Z23 Encounter for immunization: Secondary | ICD-10-CM | POA: Diagnosis not present

## 2018-01-20 DIAGNOSIS — R52 Pain, unspecified: Secondary | ICD-10-CM | POA: Diagnosis not present

## 2018-01-20 DIAGNOSIS — I480 Paroxysmal atrial fibrillation: Secondary | ICD-10-CM | POA: Diagnosis not present

## 2018-01-22 DIAGNOSIS — Z23 Encounter for immunization: Secondary | ICD-10-CM | POA: Diagnosis not present

## 2018-01-22 DIAGNOSIS — I080 Rheumatic disorders of both mitral and aortic valves: Secondary | ICD-10-CM | POA: Diagnosis not present

## 2018-01-22 DIAGNOSIS — I272 Pulmonary hypertension, unspecified: Secondary | ICD-10-CM | POA: Diagnosis not present

## 2018-01-22 DIAGNOSIS — I120 Hypertensive chronic kidney disease with stage 5 chronic kidney disease or end stage renal disease: Secondary | ICD-10-CM | POA: Diagnosis not present

## 2018-01-22 DIAGNOSIS — I9589 Other hypotension: Secondary | ICD-10-CM | POA: Diagnosis not present

## 2018-01-22 DIAGNOSIS — I480 Paroxysmal atrial fibrillation: Secondary | ICD-10-CM | POA: Diagnosis not present

## 2018-01-22 DIAGNOSIS — D631 Anemia in chronic kidney disease: Secondary | ICD-10-CM | POA: Diagnosis not present

## 2018-01-22 DIAGNOSIS — T8189XD Other complications of procedures, not elsewhere classified, subsequent encounter: Secondary | ICD-10-CM | POA: Diagnosis not present

## 2018-01-22 DIAGNOSIS — D509 Iron deficiency anemia, unspecified: Secondary | ICD-10-CM | POA: Diagnosis not present

## 2018-01-22 DIAGNOSIS — R52 Pain, unspecified: Secondary | ICD-10-CM | POA: Diagnosis not present

## 2018-01-22 DIAGNOSIS — N2581 Secondary hyperparathyroidism of renal origin: Secondary | ICD-10-CM | POA: Diagnosis not present

## 2018-01-22 DIAGNOSIS — I255 Ischemic cardiomyopathy: Secondary | ICD-10-CM | POA: Diagnosis not present

## 2018-01-22 DIAGNOSIS — N186 End stage renal disease: Secondary | ICD-10-CM | POA: Diagnosis not present

## 2018-01-25 DIAGNOSIS — I080 Rheumatic disorders of both mitral and aortic valves: Secondary | ICD-10-CM | POA: Diagnosis not present

## 2018-01-25 DIAGNOSIS — D509 Iron deficiency anemia, unspecified: Secondary | ICD-10-CM | POA: Diagnosis not present

## 2018-01-25 DIAGNOSIS — T8189XD Other complications of procedures, not elsewhere classified, subsequent encounter: Secondary | ICD-10-CM | POA: Diagnosis not present

## 2018-01-25 DIAGNOSIS — N2581 Secondary hyperparathyroidism of renal origin: Secondary | ICD-10-CM | POA: Diagnosis not present

## 2018-01-25 DIAGNOSIS — I120 Hypertensive chronic kidney disease with stage 5 chronic kidney disease or end stage renal disease: Secondary | ICD-10-CM | POA: Diagnosis not present

## 2018-01-25 DIAGNOSIS — I9589 Other hypotension: Secondary | ICD-10-CM | POA: Diagnosis not present

## 2018-01-25 DIAGNOSIS — I272 Pulmonary hypertension, unspecified: Secondary | ICD-10-CM | POA: Diagnosis not present

## 2018-01-25 DIAGNOSIS — R52 Pain, unspecified: Secondary | ICD-10-CM | POA: Diagnosis not present

## 2018-01-25 DIAGNOSIS — D631 Anemia in chronic kidney disease: Secondary | ICD-10-CM | POA: Diagnosis not present

## 2018-01-25 DIAGNOSIS — N186 End stage renal disease: Secondary | ICD-10-CM | POA: Diagnosis not present

## 2018-01-25 DIAGNOSIS — I255 Ischemic cardiomyopathy: Secondary | ICD-10-CM | POA: Diagnosis not present

## 2018-01-25 DIAGNOSIS — I480 Paroxysmal atrial fibrillation: Secondary | ICD-10-CM | POA: Diagnosis not present

## 2018-01-25 DIAGNOSIS — Z23 Encounter for immunization: Secondary | ICD-10-CM | POA: Diagnosis not present

## 2018-01-27 ENCOUNTER — Other Ambulatory Visit: Payer: Self-pay | Admitting: *Deleted

## 2018-01-27 ENCOUNTER — Encounter: Payer: Self-pay | Admitting: *Deleted

## 2018-01-27 ENCOUNTER — Encounter (HOSPITAL_COMMUNITY): Payer: Self-pay | Admitting: *Deleted

## 2018-01-27 ENCOUNTER — Encounter: Payer: Self-pay | Admitting: Vascular Surgery

## 2018-01-27 ENCOUNTER — Ambulatory Visit (INDEPENDENT_AMBULATORY_CARE_PROVIDER_SITE_OTHER): Payer: Medicare HMO | Admitting: Vascular Surgery

## 2018-01-27 ENCOUNTER — Other Ambulatory Visit: Payer: Self-pay

## 2018-01-27 VITALS — BP 110/78 | HR 58 | Temp 97.0°F | Resp 18 | Ht 73.0 in | Wt 272.0 lb

## 2018-01-27 DIAGNOSIS — I272 Pulmonary hypertension, unspecified: Secondary | ICD-10-CM | POA: Diagnosis not present

## 2018-01-27 DIAGNOSIS — N186 End stage renal disease: Secondary | ICD-10-CM

## 2018-01-27 DIAGNOSIS — I080 Rheumatic disorders of both mitral and aortic valves: Secondary | ICD-10-CM | POA: Diagnosis not present

## 2018-01-27 DIAGNOSIS — T8189XD Other complications of procedures, not elsewhere classified, subsequent encounter: Secondary | ICD-10-CM | POA: Diagnosis not present

## 2018-01-27 DIAGNOSIS — I480 Paroxysmal atrial fibrillation: Secondary | ICD-10-CM | POA: Diagnosis not present

## 2018-01-27 DIAGNOSIS — D631 Anemia in chronic kidney disease: Secondary | ICD-10-CM | POA: Diagnosis not present

## 2018-01-27 DIAGNOSIS — Z992 Dependence on renal dialysis: Secondary | ICD-10-CM

## 2018-01-27 DIAGNOSIS — I9589 Other hypotension: Secondary | ICD-10-CM | POA: Diagnosis not present

## 2018-01-27 DIAGNOSIS — I120 Hypertensive chronic kidney disease with stage 5 chronic kidney disease or end stage renal disease: Secondary | ICD-10-CM | POA: Diagnosis not present

## 2018-01-27 DIAGNOSIS — I255 Ischemic cardiomyopathy: Secondary | ICD-10-CM | POA: Diagnosis not present

## 2018-01-27 MED ORDER — ALBUTEROL SULFATE (2.5 MG/3ML) 0.083% IN NEBU: 2.5000 mg | INHALATION_SOLUTION | Freq: Four times a day (QID) | RESPIRATORY_TRACT | 5 refills | Status: DC | PRN

## 2018-01-27 NOTE — Pre-Procedure Instructions (Signed)
Dr. Carlis Abbott called about patient stopping Eliquis prior to surgery. MD stated to hold tonight and tomorrow's dose of Eliquis. Patient made aware and verbalized understanding.

## 2018-01-27 NOTE — Progress Notes (Signed)
Patient is a 57 year old male who was added on to the office scheduled today for concerns regarding his left thigh wound.  Originally had excision of a sinus tract from an old dialysis catheter by me December 22, 2017.  The incision subsequently had poor healing and he was taken back to the operating room on August 25 for debridement by Dr. Donzetta Matters.  He currently had a VAC dressing on this.  His home health nurse was concerned about the appearance and the smell of the wound today.  He denies any fever or chills.  He is also recently had a cyst removed from his right chest wall and left breast area by Dr. Rosendo Gros.  Physical exam:  Vitals:   01/27/18 1224  BP: 110/78  Pulse: (!) 58  Resp: 18  Temp: (!) 97 F (36.1 C)  TempSrc: Oral  SpO2: 100%  Weight: 272 lb (123.4 kg)  Height: 6\' 1"  (1.854 m)      Left thigh wound has no obvious induration or fluctuance.  Wound is less than 1 mm in depth.  It measures about 9 cm in length by 3 to 4 cm in width.  It is about 75% granulation tissue with areas of fibrinous exudate.  The left breast and right chest wall wounds from Dr. Rosendo Gros are slowly healing with some slight skin separation no drainage no abscess no erythema  Assessment: Slowly healing wound left leg.  I offered the patient debridement of the fibrinous exudate part I do not believe he needs a VAC any further since the wound is basically at the skin surface at this point.  Plan: Superficial debridement of the left thigh wound by my partner Dr. Carlis Abbott tomorrow in the operating room.  The patient should be able to be discharged home after this with local wound care.  He can follow-up with me in a few weeks after his debridement.  I also discussed with the family today that they can wash this once a day with soap and water and that we would discontinue the VAC at this point.  [name], MD [facility] Office: [phone] Pager: [phone]

## 2018-01-28 ENCOUNTER — Encounter (HOSPITAL_COMMUNITY)
Admission: RE | Disposition: A | Discharge status: Home / Self Care | Payer: Self-pay | Source: Ambulatory Visit | Attending: Vascular Surgery

## 2018-01-28 ENCOUNTER — Telehealth: Payer: Self-pay | Admitting: Vascular Surgery

## 2018-01-28 ENCOUNTER — Ambulatory Visit (HOSPITAL_COMMUNITY): Payer: Medicare HMO | Admitting: Anesthesiology

## 2018-01-28 ENCOUNTER — Ambulatory Visit: Payer: Medicare HMO | Admitting: Vascular Surgery

## 2018-01-28 ENCOUNTER — Telehealth: Payer: Self-pay | Admitting: *Deleted

## 2018-01-28 ENCOUNTER — Encounter (HOSPITAL_COMMUNITY): Payer: Self-pay | Admitting: Certified Registered Nurse Anesthetist

## 2018-01-28 ENCOUNTER — Ambulatory Visit (HOSPITAL_COMMUNITY)
Admission: RE | Admit: 2018-01-28 | Discharge: 2018-01-28 | Disposition: A | Discharge status: Home / Self Care | Payer: Medicare HMO | Source: Ambulatory Visit | Attending: Vascular Surgery | Admitting: Vascular Surgery

## 2018-01-28 DIAGNOSIS — Z95810 Presence of automatic (implantable) cardiac defibrillator: Secondary | ICD-10-CM | POA: Diagnosis not present

## 2018-01-28 DIAGNOSIS — Y838 Other surgical procedures as the cause of abnormal reaction of the patient, or of later complication, without mention of misadventure at the time of the procedure: Secondary | ICD-10-CM | POA: Diagnosis not present

## 2018-01-28 DIAGNOSIS — Z89412 Acquired absence of left great toe: Secondary | ICD-10-CM | POA: Diagnosis not present

## 2018-01-28 DIAGNOSIS — Z992 Dependence on renal dialysis: Secondary | ICD-10-CM | POA: Insufficient documentation

## 2018-01-28 DIAGNOSIS — Z940 Kidney transplant status: Secondary | ICD-10-CM | POA: Insufficient documentation

## 2018-01-28 DIAGNOSIS — N186 End stage renal disease: Secondary | ICD-10-CM | POA: Insufficient documentation

## 2018-01-28 DIAGNOSIS — Z89411 Acquired absence of right great toe: Secondary | ICD-10-CM | POA: Diagnosis not present

## 2018-01-28 DIAGNOSIS — T8189XA Other complications of procedures, not elsewhere classified, initial encounter: Secondary | ICD-10-CM | POA: Insufficient documentation

## 2018-01-28 DIAGNOSIS — Z89422 Acquired absence of other left toe(s): Secondary | ICD-10-CM | POA: Diagnosis not present

## 2018-01-28 DIAGNOSIS — I739 Peripheral vascular disease, unspecified: Secondary | ICD-10-CM | POA: Insufficient documentation

## 2018-01-28 DIAGNOSIS — S71102A Unspecified open wound, left thigh, initial encounter: Secondary | ICD-10-CM | POA: Diagnosis not present

## 2018-01-28 DIAGNOSIS — Z85850 Personal history of malignant neoplasm of thyroid: Secondary | ICD-10-CM | POA: Insufficient documentation

## 2018-01-28 DIAGNOSIS — Z89421 Acquired absence of other right toe(s): Secondary | ICD-10-CM | POA: Diagnosis not present

## 2018-01-28 HISTORY — PX: WOUND DEBRIDEMENT: SHX247

## 2018-01-28 LAB — COMPREHENSIVE METABOLIC PANEL
ALT: 7 U/L (ref 0–44)
ANION GAP: 16 — AB (ref 5–15)
AST: 22 U/L (ref 15–41)
Albumin: 2.9 g/dL — ABNORMAL LOW (ref 3.5–5.0)
Alkaline Phosphatase: 215 U/L — ABNORMAL HIGH (ref 38–126)
BUN: 27 mg/dL — AB (ref 6–20)
CHLORIDE: 96 mmol/L — AB (ref 98–111)
CO2: 23 mmol/L (ref 22–32)
Calcium: 9.2 mg/dL (ref 8.9–10.3)
Creatinine, Ser: 7.74 mg/dL — ABNORMAL HIGH (ref 0.61–1.24)
GFR calc Af Amer: 8 mL/min — ABNORMAL LOW (ref >60)
GFR calc non Af Amer: 7 mL/min — ABNORMAL LOW (ref >60)
Glucose, Bld: 64 mg/dL — ABNORMAL LOW (ref 70–99)
Potassium: 4.2 mmol/L (ref 3.5–5.1)
SODIUM: 135 mmol/L (ref 135–145)
TOTAL PROTEIN: 6.4 g/dL — AB (ref 6.5–8.1)
Total Bilirubin: 0.9 mg/dL (ref 0.3–1.2)

## 2018-01-28 LAB — CBC
HEMATOCRIT: 28 % — AB (ref 39.0–52.0)
Hemoglobin: 8.4 g/dL — ABNORMAL LOW (ref 13.0–17.0)
MCH: 28.1 pg (ref 26.0–34.0)
MCHC: 30 g/dL (ref 30.0–36.0)
MCV: 93.6 fL (ref 78.0–100.0)
PLATELETS: 119 10*3/uL — AB (ref 150–400)
RBC: 2.99 MIL/uL — ABNORMAL LOW (ref 4.22–5.81)
RDW: 21.8 % — AB (ref 11.5–15.5)
WBC: 5 10*3/uL (ref 4.0–10.5)

## 2018-01-28 SURGERY — DEBRIDEMENT, WOUND
Anesthesia: Monitor Anesthesia Care | Site: Groin | Laterality: Left

## 2018-01-28 MED ORDER — CEFAZOLIN SODIUM-DEXTROSE 2-4 GM/100ML-% IV SOLN
2.0000 g | INTRAVENOUS | Status: AC
  Administered 2018-01-28: 2 g via INTRAVENOUS
  Filled 2018-01-28: qty 100

## 2018-01-28 MED ORDER — OXYCODONE HCL 5 MG/5ML PO SOLN: 5.0000 mg | Freq: Once | ORAL | Status: AC | PRN

## 2018-01-28 MED ORDER — ONDANSETRON HCL 4 MG/2ML IJ SOLN
INTRAMUSCULAR | Status: DC | PRN
  Administered 2018-01-28: 4 mg via INTRAVENOUS

## 2018-01-28 MED ORDER — FENTANYL CITRATE (PF) 250 MCG/5ML IJ SOLN
INTRAMUSCULAR | Status: AC
  Filled 2018-01-28: qty 5

## 2018-01-28 MED ORDER — 0.9 % SODIUM CHLORIDE (POUR BTL) OPTIME
TOPICAL | Status: DC | PRN
  Administered 2018-01-28: 1000 mL

## 2018-01-28 MED ORDER — LIDOCAINE-EPINEPHRINE (PF) 1 %-1:200000 IJ SOLN
INTRAMUSCULAR | Status: DC | PRN
  Administered 2018-01-28: 10 mL

## 2018-01-28 MED ORDER — PROPOFOL 10 MG/ML IV BOLUS
INTRAVENOUS | Status: AC
  Filled 2018-01-28: qty 20

## 2018-01-28 MED ORDER — FENTANYL CITRATE (PF) 100 MCG/2ML IJ SOLN
INTRAMUSCULAR | Status: AC
  Filled 2018-01-28: qty 2

## 2018-01-28 MED ORDER — FENTANYL CITRATE (PF) 100 MCG/2ML IJ SOLN
25.0000 ug | INTRAMUSCULAR | Status: DC | PRN
  Administered 2018-01-28: 25 ug via INTRAVENOUS

## 2018-01-28 MED ORDER — MIDAZOLAM HCL 2 MG/2ML IJ SOLN
INTRAMUSCULAR | Status: AC
  Filled 2018-01-28: qty 2

## 2018-01-28 MED ORDER — LIDOCAINE HCL (PF) 1 % IJ SOLN
INTRAMUSCULAR | Status: AC
  Filled 2018-01-28: qty 30

## 2018-01-28 MED ORDER — FENTANYL CITRATE (PF) 100 MCG/2ML IJ SOLN
INTRAMUSCULAR | Status: DC | PRN
  Administered 2018-01-28 (×3): 50 ug via INTRAVENOUS

## 2018-01-28 MED ORDER — LIDOCAINE-EPINEPHRINE (PF) 1 %-1:200000 IJ SOLN
INTRAMUSCULAR | Status: AC
  Filled 2018-01-28: qty 30

## 2018-01-28 MED ORDER — CHLORHEXIDINE GLUCONATE 4 % EX LIQD: 60.0000 mL | Freq: Once | CUTANEOUS | Status: DC

## 2018-01-28 MED ORDER — APIXABAN 2.5 MG PO TABS: 2.5000 mg | ORAL_TABLET | Freq: Two times a day (BID) | ORAL | 0 refills | Status: DC

## 2018-01-28 MED ORDER — PROPOFOL 1000 MG/100ML IV EMUL
INTRAVENOUS | Status: AC
  Filled 2018-01-28: qty 100

## 2018-01-28 MED ORDER — OXYCODONE HCL 5 MG PO TABS
ORAL_TABLET | ORAL | Status: AC
  Filled 2018-01-28: qty 1

## 2018-01-28 MED ORDER — MIDAZOLAM HCL 5 MG/5ML IJ SOLN
INTRAMUSCULAR | Status: DC | PRN
  Administered 2018-01-28 (×2): 1 mg via INTRAVENOUS

## 2018-01-28 MED ORDER — PROPOFOL 500 MG/50ML IV EMUL
INTRAVENOUS | Status: DC | PRN
  Administered 2018-01-28: 50 ug/kg/min via INTRAVENOUS

## 2018-01-28 MED ORDER — ONDANSETRON HCL 4 MG/2ML IJ SOLN
INTRAMUSCULAR | Status: AC
  Filled 2018-01-28: qty 2

## 2018-01-28 MED ORDER — ONDANSETRON HCL 4 MG/2ML IJ SOLN: 4.0000 mg | Freq: Four times a day (QID) | INTRAMUSCULAR | Status: DC | PRN

## 2018-01-28 MED ORDER — SODIUM CHLORIDE 0.9 % IV SOLN
INTRAVENOUS | Status: DC
  Administered 2018-01-28: 07:00:00 via INTRAVENOUS

## 2018-01-28 MED ORDER — OXYCODONE HCL 5 MG PO TABS
5.0000 mg | ORAL_TABLET | Freq: Once | ORAL | Status: AC | PRN
  Administered 2018-01-28: 5 mg via ORAL

## 2018-01-28 MED ORDER — PHENYLEPHRINE 40 MCG/ML (10ML) SYRINGE FOR IV PUSH (FOR BLOOD PRESSURE SUPPORT)
PREFILLED_SYRINGE | INTRAVENOUS | Status: DC | PRN
  Administered 2018-01-28: 80 ug via INTRAVENOUS
  Administered 2018-01-28: 160 ug via INTRAVENOUS
  Administered 2018-01-28: 80 ug via INTRAVENOUS

## 2018-01-28 SURGICAL SUPPLY — 28 items
BNDG GAUZE ELAST 4 BULKY (GAUZE/BANDAGES/DRESSINGS) ×2 IMPLANT
CANISTER SUCT 3000ML PPV (MISCELLANEOUS) ×2 IMPLANT
COVER SURGICAL LIGHT HANDLE (MISCELLANEOUS) ×2 IMPLANT
DRAPE ORTHO SPLIT 77X108 STRL (DRAPES) ×2
DRAPE SURG ORHT 6 SPLT 77X108 (DRAPES) ×2 IMPLANT
ELECT REM PT RETURN 9FT ADLT (ELECTROSURGICAL) ×2
ELECTRODE REM PT RTRN 9FT ADLT (ELECTROSURGICAL) ×1 IMPLANT
GAUZE SPONGE 4X4 12PLY STRL LF (GAUZE/BANDAGES/DRESSINGS) ×2 IMPLANT
GLOVE BIO SURGEON STRL SZ7.5 (GLOVE) ×2 IMPLANT
GLOVE BIOGEL PI IND STRL 7.0 (GLOVE) ×1 IMPLANT
GLOVE BIOGEL PI IND STRL 7.5 (GLOVE) ×1 IMPLANT
GLOVE BIOGEL PI IND STRL 8 (GLOVE) ×1 IMPLANT
GLOVE BIOGEL PI INDICATOR 7.0 (GLOVE) ×1
GLOVE BIOGEL PI INDICATOR 7.5 (GLOVE) ×1
GLOVE BIOGEL PI INDICATOR 8 (GLOVE) ×1
GLOVE ECLIPSE 7.0 STRL STRAW (GLOVE) ×6 IMPLANT
GOWN STRL REUS W/ TWL LRG LVL3 (GOWN DISPOSABLE) ×3 IMPLANT
GOWN STRL REUS W/ TWL XL LVL3 (GOWN DISPOSABLE) ×2 IMPLANT
GOWN STRL REUS W/TWL LRG LVL3 (GOWN DISPOSABLE) ×3
GOWN STRL REUS W/TWL XL LVL3 (GOWN DISPOSABLE) ×2
KIT BASIN OR (CUSTOM PROCEDURE TRAY) ×2 IMPLANT
KIT TURNOVER KIT B (KITS) ×2 IMPLANT
NS IRRIG 1000ML POUR BTL (IV SOLUTION) ×4 IMPLANT
PACK GENERAL/GYN (CUSTOM PROCEDURE TRAY) ×2 IMPLANT
PAD ARMBOARD 7.5X6 YLW CONV (MISCELLANEOUS) ×4 IMPLANT
TAPE CLOTH SURG 4X10 WHT LF (GAUZE/BANDAGES/DRESSINGS) ×2 IMPLANT
TOWEL GREEN STERILE (TOWEL DISPOSABLE) ×2 IMPLANT
WATER STERILE IRR 1000ML POUR (IV SOLUTION) ×4 IMPLANT

## 2018-01-28 NOTE — Telephone Encounter (Signed)
sch appt spk to pt 02/18/18 1pm wound check

## 2018-01-28 NOTE — Telephone Encounter (Signed)
-----   Message from Ulyses Amor, [place] sent at 01/28/2018  8:25 AM EDT -----  Continue home health dressing changes once daily to left groin wet to dry with Dakins solution.    Please let his home health nurse know.  He had debridement of his left groin wound today in the OR and is going home today.  Thx MC  F/U in PA clinic or Dr. [name] in 2-3 weeks for wound check on a Thursday.

## 2018-01-28 NOTE — H&P (Signed)
History and Physical Interval Note:  01/28/2018 7:20 AM  Adam [name].  has presented today for surgery, with the diagnosis of NON-HEALING WOUND LEFT THIGH  The various methods of treatment have been discussed with the patient and family. After consideration of risks, benefits and other options for treatment, the patient has consented to  Procedure(s): DEBRIDEMENT WOUND LEFT THIGH (Left) as a surgical intervention .  The patient's history has been reviewed, patient examined, no change in status, stable for surgery.  I have reviewed the patient's chart and labs.  Questions were answered to the patient's satisfaction.    Debridement left thigh wound.  Marty Heck  Patient is a 57 year old male who was added on to the office scheduled today for concerns regarding his left thigh wound.  Originally had excision of a sinus tract from an old dialysis catheter by me December 22, 2017.  The incision subsequently had poor healing and he was taken back to the operating room on August 25 for debridement by Dr. Donzetta Matters.  He currently had a VAC dressing on this.  His home health nurse was concerned about the appearance and the smell of the wound today.  He denies any fever or chills.  He is also recently had a cyst removed from his right chest wall and left breast area by Dr. Rosendo Gros.  Past Medical History:  Diagnosis Date  . AICD (automatic cardioverter/defibrillator) present    [employer] - s/p extraction 08/21/17 due to staph infection  . Anemia   . Aortic stenosis    moderate AS by 08/2016 echo  . Arthritis    "knees" (08/05/2016)  . Atrial fibrillation ([place])   . ESRD (end stage renal disease) on dialysis ([place])    "M, T, W, T, F; NX stage hemodialysis; I do it at home" (08/05/2016)  . Great toe amputation status ([place])    status post left hallux amputation 08/01/14  . History of blood transfusion 2009   "S/P biopsy for prostate cancer check"  . Hypothyroidism (acquired)   . Mitral stenosis      moderate mitral stenosis  . Nonhealing surgical wound    left thigh  . Nonischemic cardiomyopathy ([place])   . Pneumonia 2014; 08/05/2016  . Pulmonary HTN ([place])   . Renal insufficiency   . Thyroid cancer ([place]) 2011    Past Surgical History:  Procedure Laterality Date  . ARTERIAL LINE INSERTION Right 08/24/2017   Procedure: ARTERIAL LINE INSERTION INTO RIGHT FEMORAL ARTERY;  Surgeon: Conrad Saxis, MD;  Location: [facility];  Service: Vascular;  Laterality: Right;  . BASCILIC VEIN TRANSPOSITION Right 10/13/2017   Procedure: FIRST STAGE BRACHIAL VEIN TRANSPOSITION RIGHT ARM;  Surgeon: Conrad Edina, MD;  Location: [facility];  Service: Vascular;  Laterality: Right;  . BIOPSY  09/25/2017   Procedure: BIOPSY;  Surgeon: Ronnette Juniper, MD;  Location: [facility]OSCOPY;  Service: Gastroenterology;;  . CARDIAC CATHETERIZATION Right 2017  . CARDIAC DEFIBRILLATOR PLACEMENT  2009  . DIALYSIS FISTULA CREATION Left 2003   "upper arm"  . DIALYSIS/PERMA CATHETER REMOVAL Left 09/01/2017   Procedure: DIALYSIS/PERMA CATHETER REMOVAL;  Surgeon: Conrad Chowchilla, MD;  Location: [facility];  Service: Vascular;  Laterality: Left;  . ESOPHAGOGASTRODUODENOSCOPY (EGD) WITH PROPOFOL N/A 09/25/2017   Procedure: ESOPHAGOGASTRODUODENOSCOPY (EGD) WITH PROPOFOL;  Surgeon: Ronnette Juniper, MD;  Location: [facility];  Service: Gastroenterology;  Laterality: N/A;  . HERNIA REPAIR    . I&D EXTREMITY Right 11/06/2017   Procedure: IRRIGATION AND DEBRIDEMENT KNEE;  Surgeon:  Rod Can, MD;  Location: [facility];  Service: Orthopedics;  Laterality: Right;  . I&D EXTREMITY Left 01/03/2018   Procedure: IRRIGATION AND DEBRIDEMENT OF THIGH, wound vac application  wound size 10cm x 2.5cm x 2.5cm;  Surgeon: Waynetta Sandy, MD;  Location: [facility];  Service: Vascular;  Laterality: Left;  . ICD LEAD REMOVAL Right 08/20/2017   Procedure: ICD EXTRACTION WITH C-ARM;  Surgeon: Evans Lance, MD;  Location[facility];  Service: Cardiovascular;  Laterality: Right;   DR VAN TRIGT TO BACK-UP  . icd removed    . INSERTION OF DIALYSIS CATHETER N/A 11/11/2016   Procedure: INSERTION OF TUNNELED DIALYSIS CATHETER;  Surgeon: Angelia Mould, MD;  Location: [facility];  Service: Vascular;  Laterality: N/A;  . INSERTION OF DIALYSIS CATHETER Left 08/24/2017   Procedure: INSERTION OF DIALYSIS CATHETER;  Surgeon: Conrad Assumption, MD;  Location: [facility];  Service: Vascular;  Laterality: Left;  . INSERTION OF DIALYSIS CATHETER Left 09/01/2017   Procedure: INSERTION OF DIALYSIS CATHETER;  Surgeon: Conrad Norway, MD;  Location: [facility];  Service: Vascular;  Laterality: Left;  . KIDNEY TRANSPLANT  2009  . KNEE SURGERY Bilateral 2000s   "drained fluid"  . LAPAROSCOPIC GASTRIC BANDING  2006  . LIGATION OF ARTERIOVENOUS  FISTULA Right 11/10/2017   Procedure: LIGATION OF BRACHIAL VEIN TRANSPOSITION RIGHT ARM;  Surgeon: Angelia Mould, MD;  Location: [facility];  Service: Vascular;  Laterality: Right;  . MASS EXCISION N/A 01/12/2018   Procedure: EXCISION OF MULTIPLE SUBCUTANEOUS MASSES;  Surgeon: Ralene Ok, MD;  Location: [facility];  Service: General;  Laterality: N/A;  . PROSTATE BIOPSY  2009  . REVISON OF ARTERIOVENOUS FISTULA Left 7/84/6962   Procedure: PLICATION OF ARTERIOVENOUS FISTULA ANEURYSM;  Surgeon: Elam Dutch, MD;  Location: [facility];  Service: Vascular;  Laterality: Left;  . REVISON OF ARTERIOVENOUS FISTULA Left 11/10/2016   Procedure: REVISON OF ARTERIOVENOUS FISTULA - LEFT UPPER ARM;  Surgeon: Elam Dutch, MD;  Location: [facility];  Service: Vascular;  Laterality: Left;  . REVISON OF ARTERIOVENOUS FISTULA Left 08/24/2017   Procedure: REVISON OF ARTERIOVENOUS FISTULA LEFT;  Surgeon: Conrad Wellford, MD;  Location: [facility] [place];  Service: Vascular;  Laterality: Left;  . TEE WITHOUT CARDIOVERSION N/A 08/20/2017   Procedure: TRANSESOPHAGEAL ECHOCARDIOGRAM (TEE);  Surgeon: Evans Lance, MD;  Location: [facility];  Service: Cardiovascular;  Laterality: N/A;  . THROMBECTOMY W/  EMBOLECTOMY Left 12/25/2014   Procedure: THROMBECTOMY ARTERIOVENOUS FISTULA;  Surgeon: Elam Dutch, MD;  Location: [facility];  Service: Vascular;  Laterality: Left;  . THYROIDECTOMY  2011  . TOE AMPUTATION Bilateral     right 1st and  2nd digits; left 1st and 3rd digits"  . UMBILICAL HERNIA REPAIR  X 2  . UPPER EXTREMITY VENOGRAPHY Right 11/05/2017   Procedure: UPPER EXTREMITY VENOGRAPHY;  Surgeon: Conrad Oconee, MD;  Location: [facility];  Service: Cardiovascular;  Laterality: Right;  Marland Kitchen VASCULAR ACCESS DEVICE INSERTION Left 11/10/2017   Procedure: INSERTION OF SUPER HERO VASCULAR ACCESS DEVICE LEFT ARM;  Surgeon: Angelia Mould, MD;  Location: [facility];  Service: Vascular;  Laterality: Left;  Marland Kitchen VEIN HARVEST Left 08/24/2017   Procedure: VEIN HARVEST;  Surgeon: Conrad Villas, MD;  Location: [facility];  Service: Vascular;  Laterality: Left;  . WOUND DEBRIDEMENT Left 12/22/2017   Procedure: EXCISION OF LEFT THIGH WOUND;  Surgeon: Elam Dutch, MD;  Location: [facility];  Service: Vascular;  Laterality: Left;  Social History   Socioeconomic History  . Marital status: Married    Spouse name: Not on file  . Number of children: 1  . Years of education: Not on file  . Highest education level: Not on file  Occupational History  . Occupation: Retired Designer, industrial/product from [employer]  . Financial resource strain: Not on file  . Food insecurity:    Worry: Not on file    Inability: Not on file  . Transportation needs:    Medical: Not on file    Non-medical: Not on file  Tobacco Use  . Smoking status: Never Smoker  . Smokeless tobacco: Never Used  Substance and Sexual Activity  . Alcohol use: Yes    Alcohol/week: 0.0 standard drinks    Comment: social  . Drug use: No  . Sexual activity: Yes  Lifestyle  . Physical activity:    Days per week: Not on file    Minutes per session: Not on file  . Stress: Not on file  Relationships  . Social connections:    Talks on  phone: Not on file    Gets together: Not on file    Attends religious service: Not on file    Active member of club or organization: Not on file    Attends meetings of clubs or organizations: Not on file    Relationship status: Not on file  . Intimate partner violence:    Fear of current or ex partner: Not on file    Emotionally abused: Not on file    Physically abused: Not on file    Forced sexual activity: Not on file  Other Topics Concern  . Not on file  Social History Narrative   Retired Archivist.       Physical exam:     Vitals:   01/27/18 1224  BP: 110/78  Pulse: (!) 58  Resp: 18  Temp: (!) 97 F (36.1 C)  TempSrc: Oral  SpO2: 100%  Weight: 272 lb (123.4 kg)  Height: 6\' 1"  (1.854 m)      Left thigh wound has no obvious induration or fluctuance.  Wound is less than 1 mm in depth.  It measures about 9 cm in length by 3 to 4 cm in width.  It is about 75% granulation tissue with areas of fibrinous exudate.  The left breast and right chest wall wounds from Dr. Rosendo Gros are slowly healing with some slight skin separation no drainage no abscess no erythema  Assessment: Slowly healing wound left leg.  I offered the patient debridement of the fibrinous exudate part I do not believe he needs a VAC any further since the wound is basically at the skin surface at this point.  Plan: Superficial debridement of the left thigh wound by my partner Dr. Carlis Abbott tomorrow in the operating room.  The patient should be able to be discharged home after this with local wound care.  He can follow-up with me in a few weeks after his debridement.  I also discussed with the family today that they can wash this once a day with soap and water and that we would discontinue the VAC at this point.  [name], MD [facility] Office: [phone] Pager: [phone]

## 2018-01-28 NOTE — Transfer of Care (Signed)
Immediate Anesthesia Transfer of Care Note  Patient: Adam [name].  Procedure(s) Performed: DEBRIDEMENT WOUND LEFT THIGH (Left Groin)  Patient Location: PACU  Anesthesia Type:MAC  Level of Consciousness: awake, alert , oriented, patient cooperative and responds to stimulation  Airway & Oxygen Therapy: Patient Spontanous Breathing  Post-op Assessment: Report given to RN, Post -op Vital signs reviewed and stable and Patient moving all extremities X 4  Post vital signs: Reviewed and stable  Last Vitals:  Vitals Value Taken Time  BP    Temp    Pulse 73 01/28/2018  8:39 AM  Resp 20 01/28/2018  8:39 AM  SpO2 99 % 01/28/2018  8:39 AM  Vitals shown include unvalidated device data.  Last Pain:  Vitals:   01/28/18 0640  TempSrc:   PainSc: 10-Worst pain ever         Complications: No apparent anesthesia complications

## 2018-01-28 NOTE — Discharge Instructions (Signed)
Continue home health dressing changes once daily to left groin wet to dry with Dakins solution.    Restart Eliquis tomorrow.

## 2018-01-28 NOTE — Op Note (Signed)
    Patient name: Adam [name]. MRN: [id number] DOB: 11/07/1960 Sex: male  01/28/2018 Pre-operative Diagnosis: ESRD, Left Thigh Wound Post-operative diagnosis:  Same Surgeon:  Marty Heck, MD Assistant: Laurence Slate, PA Procedure Performed: Hervey Ard excisional debridement of skin and soft tissue to left thigh wound (10 cm x 3 cm x 1 cm)   Indications: 57 year old male with long history of end-stage renal disease.  He previously underwent excision of a draining sinus in his left thigh that was secondary to a tunneled catheter.  He has previously undergone debridement of this wound.  He was seen in follow-up yesterday and had fibrinous exudate and a slow healing wound.  It was recommended to proceed back to the OR for additional debridement.  Findings: Sharp excisional debridement was performed.  The wound measured 10 cm x 3 cm x 1 cm.  There is no overt sign of purulent drainage.  There was some green staining on the dressing suggesting potential Pseudomonas colonization.   Procedure:  The patient was identified in the holding area and taken to operating room where LMA anesthesia was induced and his left thigh was sterilely prepped and draped.  Timeout was performed to identify patient, site, procedure.  I debrided back the necrotic edges of the wound to healthy tissue using Metzenbaum scissors and 10 blade scalpel.  I also debrided the base of the wound and all necrotic appearing tissue.  Everything underneath appeared viable and was bleeding.  The wound measured 10 cm x 3 cm x 1 cm deep.  The wound was copiously irrigated with saline until effluent was clear.  Saline dressing with Kerlix was applied.  Taken to PACU in stable condition.  EBL: 10 cc.  Plan: We will place him in wet-to-dry dressing in the left thigh with Dakins.  Plan for follow-up in 2 weeks for wound check.   Marty Heck, MD [facility] Office: [phone] Pager:  [place]

## 2018-01-28 NOTE — Anesthesia Postprocedure Evaluation (Signed)
Anesthesia Post Note  Patient: Adam [name].  Procedure(s) Performed: DEBRIDEMENT WOUND LEFT THIGH (Left Groin)     Patient location during evaluation: PACU Anesthesia Type: MAC Level of consciousness: awake and alert Pain management: pain level controlled Vital Signs Assessment: post-procedure vital signs reviewed and stable Respiratory status: spontaneous breathing, nonlabored ventilation, respiratory function stable and patient connected to nasal cannula oxygen Cardiovascular status: stable and blood pressure returned to baseline Postop Assessment: no apparent nausea or vomiting Anesthetic complications: no    Last Vitals:  Vitals:   01/28/18 0924 01/28/18 0951  BP: 118/61 103/89  Pulse: 70 70  Resp: 15 16  Temp: 36.7 C   SpO2: 100% 100%    Last Pain:  Vitals:   01/28/18 0951  TempSrc:   PainSc: 0-No pain                 Tamey Wanek S

## 2018-01-28 NOTE — Progress Notes (Signed)
Notified Butch Penny with [facility] of pt's procedure and continued HH needs.

## 2018-01-28 NOTE — Anesthesia Preprocedure Evaluation (Signed)
Anesthesia Evaluation  Patient identified by MRN, date of birth, ID band Patient awake    Reviewed: Allergy & Precautions, H&P , NPO status , Patient's Chart, lab work & pertinent test results  Airway Mallampati: II   Neck ROM: full    Dental   Pulmonary  Pulmonary HTN   breath sounds clear to auscultation       Cardiovascular + Peripheral Vascular Disease  + Cardiac Defibrillator + Valvular Problems/Murmurs AS  Rhythm:regular Rate:Normal  Moderate AS.    Neuro/Psych    GI/Hepatic   Endo/Other  Hypothyroidism   Renal/GU ESRF and DialysisRenal disease     Musculoskeletal  (+) Arthritis ,   Abdominal   Peds  Hematology   Anesthesia Other Findings   Reproductive/Obstetrics                             Anesthesia Physical Anesthesia Plan  ASA: IV  Anesthesia Plan: MAC   Post-op Pain Management:    Induction: Intravenous  PONV Risk Score and Plan: 1 and Ondansetron, Treatment may vary due to age or medical condition and Propofol infusion  Airway Management Planned: Simple Face Mask  Additional Equipment:   Intra-op Plan:   Post-operative Plan:   Informed Consent: I have reviewed the patients History and Physical, chart, labs and discussed the procedure including the risks, benefits and alternatives for the proposed anesthesia with the patient or authorized representative who has indicated his/her understanding and acceptance.     Plan Discussed with: CRNA, Anesthesiologist and Surgeon  Anesthesia Plan Comments:         Anesthesia Quick Evaluation

## 2018-01-29 ENCOUNTER — Encounter (HOSPITAL_COMMUNITY): Payer: Self-pay | Admitting: Vascular Surgery

## 2018-01-29 ENCOUNTER — Telehealth: Payer: Self-pay | Admitting: Pulmonary Disease

## 2018-01-29 DIAGNOSIS — T8189XD Other complications of procedures, not elsewhere classified, subsequent encounter: Secondary | ICD-10-CM | POA: Diagnosis not present

## 2018-01-29 DIAGNOSIS — I080 Rheumatic disorders of both mitral and aortic valves: Secondary | ICD-10-CM | POA: Diagnosis not present

## 2018-01-29 DIAGNOSIS — I272 Pulmonary hypertension, unspecified: Secondary | ICD-10-CM | POA: Diagnosis not present

## 2018-01-29 DIAGNOSIS — I120 Hypertensive chronic kidney disease with stage 5 chronic kidney disease or end stage renal disease: Secondary | ICD-10-CM | POA: Diagnosis not present

## 2018-01-29 DIAGNOSIS — Z23 Encounter for immunization: Secondary | ICD-10-CM | POA: Diagnosis not present

## 2018-01-29 DIAGNOSIS — N2581 Secondary hyperparathyroidism of renal origin: Secondary | ICD-10-CM | POA: Diagnosis not present

## 2018-01-29 DIAGNOSIS — N186 End stage renal disease: Secondary | ICD-10-CM | POA: Diagnosis not present

## 2018-01-29 DIAGNOSIS — I480 Paroxysmal atrial fibrillation: Secondary | ICD-10-CM | POA: Diagnosis not present

## 2018-01-29 DIAGNOSIS — I9589 Other hypotension: Secondary | ICD-10-CM | POA: Diagnosis not present

## 2018-01-29 DIAGNOSIS — D631 Anemia in chronic kidney disease: Secondary | ICD-10-CM | POA: Diagnosis not present

## 2018-01-29 DIAGNOSIS — I255 Ischemic cardiomyopathy: Secondary | ICD-10-CM | POA: Diagnosis not present

## 2018-01-29 DIAGNOSIS — D509 Iron deficiency anemia, unspecified: Secondary | ICD-10-CM | POA: Diagnosis not present

## 2018-01-29 DIAGNOSIS — R52 Pain, unspecified: Secondary | ICD-10-CM | POA: Diagnosis not present

## 2018-01-29 NOTE — Telephone Encounter (Signed)
Spoke with [employer]-states dx code will not cover neb machine for patient and she spoke with patient regarding this. Pt will go through local pharmacy to purchase neb machine out of pocket. Nothing more needed at this time.

## 2018-01-30 DIAGNOSIS — Z89411 Acquired absence of right great toe: Secondary | ICD-10-CM | POA: Diagnosis not present

## 2018-01-30 DIAGNOSIS — Z89412 Acquired absence of left great toe: Secondary | ICD-10-CM | POA: Diagnosis not present

## 2018-01-30 DIAGNOSIS — I4891 Unspecified atrial fibrillation: Secondary | ICD-10-CM | POA: Diagnosis not present

## 2018-01-30 DIAGNOSIS — N186 End stage renal disease: Secondary | ICD-10-CM | POA: Diagnosis not present

## 2018-01-30 DIAGNOSIS — I70203 Unspecified atherosclerosis of native arteries of extremities, bilateral legs: Secondary | ICD-10-CM | POA: Diagnosis not present

## 2018-01-30 DIAGNOSIS — I272 Pulmonary hypertension, unspecified: Secondary | ICD-10-CM | POA: Diagnosis not present

## 2018-01-30 DIAGNOSIS — E669 Obesity, unspecified: Secondary | ICD-10-CM | POA: Diagnosis not present

## 2018-01-30 DIAGNOSIS — Z992 Dependence on renal dialysis: Secondary | ICD-10-CM | POA: Diagnosis not present

## 2018-01-30 DIAGNOSIS — M170 Bilateral primary osteoarthritis of knee: Secondary | ICD-10-CM | POA: Diagnosis not present

## 2018-01-30 DIAGNOSIS — E785 Hyperlipidemia, unspecified: Secondary | ICD-10-CM | POA: Diagnosis not present

## 2018-02-01 DIAGNOSIS — I480 Paroxysmal atrial fibrillation: Secondary | ICD-10-CM | POA: Diagnosis not present

## 2018-02-01 DIAGNOSIS — I9589 Other hypotension: Secondary | ICD-10-CM | POA: Diagnosis not present

## 2018-02-01 DIAGNOSIS — D509 Iron deficiency anemia, unspecified: Secondary | ICD-10-CM | POA: Diagnosis not present

## 2018-02-01 DIAGNOSIS — N2581 Secondary hyperparathyroidism of renal origin: Secondary | ICD-10-CM | POA: Diagnosis not present

## 2018-02-01 DIAGNOSIS — D631 Anemia in chronic kidney disease: Secondary | ICD-10-CM | POA: Diagnosis not present

## 2018-02-01 DIAGNOSIS — R52 Pain, unspecified: Secondary | ICD-10-CM | POA: Diagnosis not present

## 2018-02-01 DIAGNOSIS — T8189XD Other complications of procedures, not elsewhere classified, subsequent encounter: Secondary | ICD-10-CM | POA: Diagnosis not present

## 2018-02-01 DIAGNOSIS — I255 Ischemic cardiomyopathy: Secondary | ICD-10-CM | POA: Diagnosis not present

## 2018-02-01 DIAGNOSIS — N186 End stage renal disease: Secondary | ICD-10-CM | POA: Diagnosis not present

## 2018-02-01 DIAGNOSIS — M1712 Unilateral primary osteoarthritis, left knee: Secondary | ICD-10-CM | POA: Diagnosis not present

## 2018-02-01 DIAGNOSIS — I080 Rheumatic disorders of both mitral and aortic valves: Secondary | ICD-10-CM | POA: Diagnosis not present

## 2018-02-01 DIAGNOSIS — I120 Hypertensive chronic kidney disease with stage 5 chronic kidney disease or end stage renal disease: Secondary | ICD-10-CM | POA: Diagnosis not present

## 2018-02-01 DIAGNOSIS — Z23 Encounter for immunization: Secondary | ICD-10-CM | POA: Diagnosis not present

## 2018-02-01 DIAGNOSIS — I272 Pulmonary hypertension, unspecified: Secondary | ICD-10-CM | POA: Diagnosis not present

## 2018-02-02 ENCOUNTER — Inpatient Hospital Stay: Payer: Medicare HMO | Admitting: Infectious Disease

## 2018-02-02 ENCOUNTER — Encounter: Payer: Self-pay | Admitting: Family Medicine

## 2018-02-03 DIAGNOSIS — N186 End stage renal disease: Secondary | ICD-10-CM | POA: Diagnosis not present

## 2018-02-03 DIAGNOSIS — R52 Pain, unspecified: Secondary | ICD-10-CM | POA: Diagnosis not present

## 2018-02-03 DIAGNOSIS — D631 Anemia in chronic kidney disease: Secondary | ICD-10-CM | POA: Diagnosis not present

## 2018-02-03 DIAGNOSIS — N2581 Secondary hyperparathyroidism of renal origin: Secondary | ICD-10-CM | POA: Diagnosis not present

## 2018-02-03 DIAGNOSIS — D509 Iron deficiency anemia, unspecified: Secondary | ICD-10-CM | POA: Diagnosis not present

## 2018-02-03 DIAGNOSIS — Z23 Encounter for immunization: Secondary | ICD-10-CM | POA: Diagnosis not present

## 2018-02-04 ENCOUNTER — Ambulatory Visit: Payer: Medicare HMO

## 2018-02-06 DIAGNOSIS — I255 Ischemic cardiomyopathy: Secondary | ICD-10-CM | POA: Diagnosis not present

## 2018-02-06 DIAGNOSIS — D631 Anemia in chronic kidney disease: Secondary | ICD-10-CM | POA: Diagnosis not present

## 2018-02-06 DIAGNOSIS — I9589 Other hypotension: Secondary | ICD-10-CM | POA: Diagnosis not present

## 2018-02-06 DIAGNOSIS — I120 Hypertensive chronic kidney disease with stage 5 chronic kidney disease or end stage renal disease: Secondary | ICD-10-CM | POA: Diagnosis not present

## 2018-02-06 DIAGNOSIS — N186 End stage renal disease: Secondary | ICD-10-CM | POA: Diagnosis not present

## 2018-02-06 DIAGNOSIS — Z23 Encounter for immunization: Secondary | ICD-10-CM | POA: Diagnosis not present

## 2018-02-06 DIAGNOSIS — I480 Paroxysmal atrial fibrillation: Secondary | ICD-10-CM | POA: Diagnosis not present

## 2018-02-06 DIAGNOSIS — I272 Pulmonary hypertension, unspecified: Secondary | ICD-10-CM | POA: Diagnosis not present

## 2018-02-06 DIAGNOSIS — R52 Pain, unspecified: Secondary | ICD-10-CM | POA: Diagnosis not present

## 2018-02-06 DIAGNOSIS — N2581 Secondary hyperparathyroidism of renal origin: Secondary | ICD-10-CM | POA: Diagnosis not present

## 2018-02-06 DIAGNOSIS — I080 Rheumatic disorders of both mitral and aortic valves: Secondary | ICD-10-CM | POA: Diagnosis not present

## 2018-02-06 DIAGNOSIS — D509 Iron deficiency anemia, unspecified: Secondary | ICD-10-CM | POA: Diagnosis not present

## 2018-02-06 DIAGNOSIS — T8189XD Other complications of procedures, not elsewhere classified, subsequent encounter: Secondary | ICD-10-CM | POA: Diagnosis not present

## 2018-02-08 DIAGNOSIS — Z23 Encounter for immunization: Secondary | ICD-10-CM | POA: Diagnosis not present

## 2018-02-08 DIAGNOSIS — Z992 Dependence on renal dialysis: Secondary | ICD-10-CM | POA: Diagnosis not present

## 2018-02-08 DIAGNOSIS — N2581 Secondary hyperparathyroidism of renal origin: Secondary | ICD-10-CM | POA: Diagnosis not present

## 2018-02-08 DIAGNOSIS — E1129 Type 2 diabetes mellitus with other diabetic kidney complication: Secondary | ICD-10-CM | POA: Diagnosis not present

## 2018-02-08 DIAGNOSIS — N186 End stage renal disease: Secondary | ICD-10-CM | POA: Diagnosis not present

## 2018-02-08 DIAGNOSIS — D631 Anemia in chronic kidney disease: Secondary | ICD-10-CM | POA: Diagnosis not present

## 2018-02-08 DIAGNOSIS — R52 Pain, unspecified: Secondary | ICD-10-CM | POA: Diagnosis not present

## 2018-02-08 DIAGNOSIS — D509 Iron deficiency anemia, unspecified: Secondary | ICD-10-CM | POA: Diagnosis not present

## 2018-02-09 DIAGNOSIS — L0291 Cutaneous abscess, unspecified: Secondary | ICD-10-CM

## 2018-02-09 HISTORY — DX: Cutaneous abscess, unspecified: L02.91

## 2018-02-10 ENCOUNTER — Ambulatory Visit: Payer: Medicare HMO | Admitting: Family Medicine

## 2018-02-10 ENCOUNTER — Other Ambulatory Visit: Payer: Self-pay

## 2018-02-10 ENCOUNTER — Encounter: Payer: Self-pay | Admitting: Family Medicine

## 2018-02-10 VITALS — BP 118/64 | HR 78 | Ht 73.0 in | Wt 271.0 lb

## 2018-02-10 DIAGNOSIS — R6883 Chills (without fever): Secondary | ICD-10-CM

## 2018-02-10 DIAGNOSIS — G4700 Insomnia, unspecified: Secondary | ICD-10-CM | POA: Diagnosis not present

## 2018-02-10 DIAGNOSIS — R0602 Shortness of breath: Secondary | ICD-10-CM | POA: Diagnosis not present

## 2018-02-10 DIAGNOSIS — D631 Anemia in chronic kidney disease: Secondary | ICD-10-CM | POA: Diagnosis not present

## 2018-02-10 DIAGNOSIS — N186 End stage renal disease: Secondary | ICD-10-CM | POA: Diagnosis not present

## 2018-02-10 DIAGNOSIS — D509 Iron deficiency anemia, unspecified: Secondary | ICD-10-CM | POA: Diagnosis not present

## 2018-02-10 DIAGNOSIS — N2581 Secondary hyperparathyroidism of renal origin: Secondary | ICD-10-CM | POA: Diagnosis not present

## 2018-02-10 DIAGNOSIS — E8770 Fluid overload, unspecified: Secondary | ICD-10-CM | POA: Diagnosis not present

## 2018-02-10 NOTE — Patient Instructions (Signed)
It was great seeing you today! We have addressed the following issues today  1. We will check a CBC today to assess for sign of infection, I will call you tomorrow with the results. 2. Make sure you call Dr.McQuaid office to follow up on the Echocardiogram he has ordered.  3. Reschedule you colonoscopy with [facility] 4. Follow up with Surgery tomorrow 10/3 and Vascular for your thigh wound next week.   If we did any lab work today, and the results require attention, either me or my nurse will get in touch with you. If everything is normal, you will get a letter in mail and a message via . If you don't hear from [place] in two weeks, please give [place] a call. Otherwise, we look forward to seeing you again at your next visit. If you have any questions or concerns before then, please call the clinic at [phone].  Please bring all your medications to every doctors visit  Sign up for My Chart to have easy access to your labs results, and communication with your Primary care physician. Please ask Front Desk for some assistance.   Please check-out at the front desk before leaving the clinic.    Take Care,   Dr. Andy Gauss

## 2018-02-10 NOTE — Progress Notes (Signed)
Subjective:    Patient ID: Bennye Alm., male    DOB: Sep 22, 1960, 57 y.o.   MRN: [id number]   CC: Insomnia  HPI: Patient isa 57 yo male ESRD on HD and muliple others medical problems who presents today to discuss problems sleeping. Patient has been going through a lot in the past few months. He recently had debridement of a left thigh wound and is currently being followed by vascular. Patient has been changing his own dressing and is concerned about possible infection he will follow up with them next week. Patient also reports diarrhea for the past 3 days, as well as chills. Patient has not not any recent antibiotic but has had diarrhea multiple times in the past few months from previous antibiotic regimen. He was tested for C.diff twice and has been negative. 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 Patient also reports some shortness of breath for the past two days but denies chest pain or dizziness. He has not missed any HD session and is at his dry weight.  Smoking status reviewed   ROS: all other systems were reviewed and are negative other than in the HPI   Past Medical History:  Diagnosis Date  . AICD (automatic cardioverter/defibrillator) present    [employer] - s/p extraction 08/21/17 due to staph infection  . Anemia   . Aortic stenosis    moderate AS by 08/2016 echo  . Arthritis    "knees" (08/05/2016)  . Atrial fibrillation ([place])   . ESRD (end stage renal disease) on dialysis ([place])    "M, T, W, T, F; NX stage hemodialysis; I do it at home" (08/05/2016)  . 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 History of blood transfusion 2009   "S/P biopsy for prostate cancer check"  . Hypothyroidism (acquired)   . Mitral stenosis    moderate mitral stenosis  . Nonhealing surgical wound    left thigh  . Nonischemic cardiomyopathy ([place])   . Pneumonia 2014; 08/05/2016  . Pulmonary HTN ([place])   .  Renal insufficiency   . Thyroid cancer ([place]) 2011    Past Surgical History:  Procedure Laterality Date  . ARTERIAL LINE INSERTION Right 08/24/2017   Procedure: ARTERIAL LINE INSERTION INTO RIGHT FEMORAL ARTERY;  Surgeon: Conrad B[place], MD;  Location: [place];  Service: Vascular;  Laterality: Right;  . BASCILIC VEIN TRANSPOSITION Right 10/13/2017   Procedure: FIRST STAGE BRACHIAL VEIN TRANSPOSITION RIGHT ARM;  Surgeon: Conrad [place], MD;  Location: [place];  Service: Vascular;  Laterality: Right;  . BIOPSY  09/25/2017   Procedure: BIOPSY;  Surgeon: Ronnette Juniper, MD;  Location: [facility] ENDOSCOPY;  Service: Gastroenterology;;  . CARDIAC CATHETERIZATION Right 2017  . CARDIAC DEFIBRILLATOR PLACEMENT  2009  . DIALYSIS FISTULA CREATION Left 2003   "upper arm"  . DIALYSIS/PERMA CATHETER REMOVAL Left 09/01/2017   Procedure: DIALYSIS/PERMA CATHETER REMOVAL;  Surgeon: Conrad [place], MD;  Location: [place];  Service: Vascular;  Laterality: Left;  . ESOPHAGOGASTRODUODENOSCOPY (EGD) WITH PROPOFOL N/A 09/25/2017   Procedure: ESOPHAGOGASTRODUODENOSCOPY (EGD) WITH PROPOFOL;  Surgeon: Ronnette Juniper, MD;  Location: [place];  Service: Gastroenterology;  Laterality: N/A;  . HERNIA REPAIR    . I&D EXTREMITY Right 11/06/2017   Procedure: IRRIGATION AND DEBRIDEMENT KNEE;  Surgeon: Rod Can, MD;  Location: [place];  Service: Orthopedics;  Laterality: Right;  . I&D EXTREMITY Left 01/03/2018   Procedure: IRRIGATION AND DEBRIDEMENT  OF THIGH, wound vac application  wound size 10cm x 2.5cm x 2.5cm;  Surgeon: Waynetta Sandy, MD;  Location: [place];  Service: Vascular;  Laterality: Left;  . ICD LEAD REMOVAL Right 08/20/2017   Procedure: ICD EXTRACTION WITH C-ARM;  Surgeon: Evans Lance, MD;  Location: [facility] OR;  Service: Cardiovascular;  Laterality: Right;  DR VAN TRIGT TO BACK-UP  . icd removed    . INSERTION OF DIALYSIS CATHETER N/A 11/11/2016   Procedure: INSERTION OF TUNNELED DIALYSIS CATHETER;  Surgeon: Angelia Mould, MD;  Location: [place];  Service: Vascular;  Laterality: N/A;  . INSERTION OF DIALYSIS CATHETER Left 08/24/2017   Procedure: INSERTION OF DIALYSIS CATHETER;  Surgeon: Conrad [place], MD;  Location: [place];  Service: Vascular;  Laterality: Left;  . INSERTION OF DIALYSIS CATHETER Left 09/01/2017   Procedure: INSERTION OF DIALYSIS CATHETER;  Surgeon: Conrad Plato, MD;  Location: [place];  Service: Vascular;  Laterality: Left;  . KIDNEY TRANSPLANT  2009  . KNEE SURGERY Bilateral 2000s   "drained fluid"  . LAPAROSCOPIC GASTRIC BANDING  2006  . LIGATION OF ARTERIOVENOUS  FISTULA Right 11/10/2017   Procedure: LIGATION OF BRACHIAL VEIN TRANSPOSITION RIGHT ARM;  Surgeon: Angelia Mould, MD;  Location: [place];  Service: Vascular;  Laterality: Right;  . MASS EXCISION N/A 01/12/2018   Procedure: EXCISION OF MULTIPLE SUBCUTANEOUS MASSES;  Surgeon: Ralene Ok, MD;  Location: [place];  Service: General;  Laterality: N/A;  . PROSTATE BIOPSY  2009  . REVISON OF ARTERIOVENOUS FISTULA Left 6/96/7893   Procedure: PLICATION OF ARTERIOVENOUS FISTULA ANEURYSM;  Surgeon: Elam Dutch, MD;  Location: [place];  Service: Vascular;  Laterality: Left;  . REVISON OF ARTERIOVENOUS FISTULA Left 11/10/2016   Procedure: REVISON OF ARTERIOVENOUS FISTULA - LEFT UPPER ARM;  Surgeon: Elam Dutch, MD;  Location: [place];  Service: Vascular;  Laterality: Left;  . REVISON OF ARTERIOVENOUS FISTULA Left 08/24/2017   Procedure: REVISON OF ARTERIOVENOUS FISTULA LEFT;  Surgeon: Conrad Malta, MD;  Location: [place];  Service: Vascular;  Laterality: Left;  . TEE WITHOUT CARDIOVERSION N/A 08/20/2017   Procedure: TRANSESOPHAGEAL ECHOCARDIOGRAM (TEE);  Surgeon: Evans Lance, MD;  Location: [place];  Service: Cardiovascular;  Laterality: N/A;  . THROMBECTOMY W/ EMBOLECTOMY Left 12/25/2014   Procedure: THROMBECTOMY ARTERIOVENOUS FISTULA;  Surgeon: Elam Dutch, MD;  Location: [place];  Service: Vascular;  Laterality: Left;  .  THYROIDECTOMY  2011  . TOE AMPUTATION Bilateral     right 1st and  2nd digits; left 1st and 3rd digits"  . UMBILICAL HERNIA REPAIR  X 2  . UPPER EXTREMITY VENOGRAPHY Right 11/05/2017   Procedure: UPPER EXTREMITY VENOGRAPHY;  Surgeon: Conrad [place], MD;  Location: [place] CV [facility];  Service: Cardiovascular;  Laterality: Right;  Marland Kitchen VASCULAR ACCESS DEVICE INSERTION Left 11/10/2017   Procedure: INSERTION OF SUPER HERO VASCULAR ACCESS DEVICE LEFT ARM;  Surgeon: Angelia Mould, MD;  Location: [place];  Service: Vascular;  Laterality: Left;  Marland Kitchen VEIN HARVEST Left 08/24/2017   Procedure: VEIN HARVEST;  Surgeon: Conrad Woolsey, MD;  Location: [place];  Service: Vascular;  Laterality: Left;  . WOUND DEBRIDEMENT Left 12/22/2017   Procedure: EXCISION OF LEFT THIGH WOUND;  Surgeon: Elam Dutch, MD;  Location: [place];  Service: Vascular;  Laterality: Left;  . WOUND DEBRIDEMENT Left 01/28/2018   Procedure: DEBRIDEMENT WOUND LEFT THIGH;  Surgeon: Marty Heck, MD;  Location: [place];  Service: Vascular;  Laterality: Left;    Past medical history, surgical, family, and social history reviewed and updated in the EMR as appropriate.  Objective:  BP 118/64   Pulse 78   Ht 6\' 1"  (1.854 m)   Wt 271 lb (122.9 kg)   SpO2 97%   BMI 35.75 kg/m   Vitals and nursing note reviewed  General: NAD, pleasant, able to participate in exam Cardiac: RRR, normal heart sounds, no murmurs. 2+ radial and PT pulses bilaterally Respiratory: CTAB, normal effort, No wheezes, rales or rhonchi Abdomen: soft, nontender, nondistended, no hepatic or splenomegaly, +BS Extremities: no edema or cyanosis. WWP. Skin: R flank incision without drainage with surrounding indurated skin, left mammary incision with minimal drainage of serous fluid. No odor associated with any lesion sites. Neuro: alert and oriented x4, no focal deficits Psych: Normal affect and mood   Assessment & Plan:    Chills Patient complains of chills  and overall ill feeling for the past few days. He also reports that diarrhea has returned although he has not been on antibiotic therapy for the past few months. Patient has been tested for C.diff twice. Patient also endorse some shortness of breath. He has a history of pulmonary hypertension and is on [place]. Unclear SOB is progression of PH or related possibly to a developing infectious process. Patient had recently a 10 cm left thigh/groin debrided which was suspicious for possible infection ( pseudomonas) but patient declined antibiotic therapy due to fear of recurrent diarrhea.Vitals are within normal limits and patient does not appear toxic. Will hold off on sending patient to the ED.  --Will check CBC with Diff if elevated will recheck for C.diff and will likely start antibiotic regimen.  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  --Follow up appointment with vascular surgery for thigh wound next week 10/10.   Shortness of breath Reported shortness of breath could be secondary to developing infection from multiple wounds vs progression of pulmonary hypertension ( amiodarone toxicity?) vs anemia of chronic disease in an ESRD patient vs cardiac etiology with history of afib currently on amiodarone. O2 sat today in clinic was 97%, no increase WOB with normal lung exam. Patient received EPO today in dialysis will monitor for improvement in breathing. Patient will also get in touch with pulmonologist for reevaluation of [place]. Currently on adcirca and leitaris. Echo from 08/2017 show RVSP of 59, therefore still elevated but improved from echo in 2018. Patient has been on amiodarone for the past few month and it is known to cause pulmonary fibrosis though usually with more chronic use. Patient does have a history of atrial fibrillation but was recently seen by cardiology and started on amiodarone he denies any palpitations  or chest pain. Lastly, SOB could be manifestation of developing systemic infection, will follow on WBC with low threshold to start antibiotic.  Insomnia Difficulty sleeping, likely secondary to multiple medical problems. Recommended trial of melatonin which should not be an issue given patient has ESRD and is on HD. Will reassess in a few days.    [name], MD [place] PGY-3

## 2018-02-11 ENCOUNTER — Other Ambulatory Visit: Payer: Medicare HMO

## 2018-02-11 ENCOUNTER — Ambulatory Visit (HOSPITAL_COMMUNITY)
Admission: RE | Admit: 2018-02-11 | Discharge: 2018-02-11 | Disposition: A | Discharge status: Home / Self Care | Payer: Medicare HMO | Source: Ambulatory Visit | Attending: Pulmonary Disease | Admitting: Pulmonary Disease

## 2018-02-11 ENCOUNTER — Ambulatory Visit: Payer: Medicare HMO | Admitting: Pulmonary Disease

## 2018-02-11 ENCOUNTER — Encounter: Payer: Self-pay | Admitting: Pulmonary Disease

## 2018-02-11 VITALS — BP 140/86 | HR 100 | Ht 71.5 in | Wt 272.4 lb

## 2018-02-11 DIAGNOSIS — G4700 Insomnia, unspecified: Secondary | ICD-10-CM | POA: Insufficient documentation

## 2018-02-11 DIAGNOSIS — N186 End stage renal disease: Secondary | ICD-10-CM | POA: Diagnosis not present

## 2018-02-11 DIAGNOSIS — R0602 Shortness of breath: Secondary | ICD-10-CM | POA: Insufficient documentation

## 2018-02-11 DIAGNOSIS — I272 Pulmonary hypertension, unspecified: Secondary | ICD-10-CM

## 2018-02-11 DIAGNOSIS — R609 Edema, unspecified: Secondary | ICD-10-CM | POA: Diagnosis not present

## 2018-02-11 DIAGNOSIS — R6883 Chills (without fever): Secondary | ICD-10-CM | POA: Insufficient documentation

## 2018-02-11 LAB — CBC WITH DIFFERENTIAL/PLATELET
Basophils Absolute: 0 10*3/uL (ref 0.0–0.2)
Basos: 0 %
EOS (ABSOLUTE): 0.1 10*3/uL (ref 0.0–0.4)
EOS: 1 %
HEMATOCRIT: 28.2 % — AB (ref 37.5–51.0)
HEMOGLOBIN: 8.8 g/dL — AB (ref 13.0–17.7)
IMMATURE GRANS (ABS): 0 10*3/uL (ref 0.0–0.1)
IMMATURE GRANULOCYTES: 1 %
LYMPHS: 8 %
Lymphocytes Absolute: 0.5 10*3/uL — ABNORMAL LOW (ref 0.7–3.1)
MCH: 27 pg (ref 26.6–33.0)
MCHC: 31.2 g/dL — ABNORMAL LOW (ref 31.5–35.7)
MCV: 87 fL (ref 79–97)
Monocytes Absolute: 0.5 10*3/uL (ref 0.1–0.9)
Monocytes: 9 %
NEUTROS ABS: 4.7 10*3/uL (ref 1.4–7.0)
NEUTROS PCT: 81 %
Platelets: 122 10*3/uL — ABNORMAL LOW (ref 150–450)
RBC: 3.26 x10E6/uL — ABNORMAL LOW (ref 4.14–5.80)
RDW: 17.7 % — ABNORMAL HIGH (ref 12.3–15.4)
WBC: 5.8 10*3/uL (ref 3.4–10.8)

## 2018-02-11 NOTE — Progress Notes (Signed)
Subjective:    Patient ID: Adam Alm., male    DOB: 11-23-1960, 57 y.o.   MRN: [id number]  Synopsis: Diagnosed with pulmonary hypertension in 2012. 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 Unfortunately his kidney transplant failed for uncertain reasons by 3716. Was diagnosed with pulmonary hypertension by 2012 in [place]. Was started on [place] in [place] and moved to [place] not long afterwards. Developed atrial fibrillation around 2009, had a defibrillator placed.  08/2015 [facility]: Cardiac Index (l/min/m2) 3.7 L/min/m2  Right Atrium Mean Pressure (mmHg) 5 mmHg   Right Ventricle Systolic Pressure (mmHg) 46 mmHg   Pulmonary Artery Mean Pressure (mmHg) 31 mmHg   Pulmonary Wedge Pressure (mmHg) 11 mmHg   Pulmonary Vascular Resistance (Wood units) 2.2 Wood units   He had a second evaluation for renal transplant at [facility] in 2017 but was denied because of his hypertension and atrial fibrillation.  His dry weight is 122kg, his BP is best there.   He was hospitalized in early 2018 for healthcare associated pneumonia  In 2019 he was hospitalized for a staph infection involving a septic knee joint, he had to have his vascular dialysis graft removed and his pacemaker changed.  HPI Chief Complaint  Patient presents with  . Follow-up    "lots of SOB lately" - breathing had been 'good" until recently - late Aug into Sept. .  Had "staph infection in April."  .     Rich says that for the last 9 or 10 days he has been feeling more short of breath with any activity.  He has been compliant with his Letairis and tadalafil without any changes.  His dry weight has been lowered to 122 kg.  He has been dealing with a wound in his left leg with significant increased leg swelling in that leg.  He is also been dealing with draining wounds from cysts which were removed and his right flank and left axillary area.  He recently had blood  cultures checked he is awaiting the results.  He had some blood test yesterday by his primary care physician.  He continues to take Eliquis twice a day.  He is continuing to take amiodarone.  He has not had a cough or mucus production.  No fevers or chills.  However, he has had some mild body aches and diarrhea for the last 2 weeks.  Past Medical History:  Diagnosis Date  . AICD (automatic cardioverter/defibrillator) present    [employer] - s/p extraction 08/21/17 due to staph infection  . Anemia   . Aortic stenosis    moderate AS by 08/2016 echo  . Arthritis    "knees" (08/05/2016)  . Atrial fibrillation ([place])   . ESRD (end stage renal disease) on dialysis ([place])    "M, T, W, T, F; NX stage hemodialysis; I do it at home" (08/05/2016)  . 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 History of blood transfusion 2009   "S/P biopsy for prostate cancer check"  . Hypothyroidism (acquired)   . Mitral stenosis    moderate mitral stenosis  . Nonhealing surgical wound    left thigh  . Nonischemic cardiomyopathy ([place])   . Pneumonia 2014; 08/05/2016  . Pulmonary HTN ([place])   . Renal insufficiency   . Thyroid cancer ([place]) 2011      Review of Systems  Constitutional: Positive for fatigue. Negative for chills  and fever.  HENT: Negative for postnasal drip, rhinorrhea and sinus pressure.   Respiratory: Positive for shortness of breath. Negative for cough and wheezing.   Cardiovascular: Positive for leg swelling. Negative for chest pain and palpitations.       Objective:   Physical Exam Vitals:   02/11/18 1024  BP: 140/86  Pulse: 100  SpO2: 100%  Weight: 272 lb 6.4 oz (123.6 kg)  Height: 5' 11.5" (1.816 m)   RA  Gen: chronically ill appearing HENT: OP clear, TM's clear, neck supple PULM: CTA B, normal percussion CV: RRR, systolic murmur, trace edema GI: BS+, soft, nontender Derm:significant swelling left leg, R flank Psyche: normal mood and  affect    CBC    Component Value Date/Time   WBC 5.8 02/10/2018 1556   WBC 5.0 01/28/2018 0608   RBC 3.26 (L) 02/10/2018 1556   RBC 2.99 (L) 01/28/2018 0608   HGB 8.8 (L) 02/10/2018 1556   HCT 28.2 (L) 02/10/2018 1556   PLT 122 (L) 02/10/2018 1556   MCV 87 02/10/2018 1556   MCH 27.0 02/10/2018 1556   MCH 28.1 01/28/2018 0608   MCHC 31.2 (L) 02/10/2018 1556   MCHC 30.0 01/28/2018 0608   RDW 17.7 (H) 02/10/2018 1556   LYMPHSABS 0.5 (L) 02/10/2018 1556   MONOABS 0.4 01/02/2018 1721   EOSABS 0.1 02/10/2018 1556   BASOSABS 0.0 02/10/2018 1556     6MW March 2017 6 minute walk test at [place] m, O2 saturation 100% on room air  Echo: March 2017 echocardiogram [facility] RVSP 40 mmHg, LVEF 55% March 2018 echo [facility]: RVSP 37mm Hg 08/2017 Echo RVSP 66mmHg  [facility] April 2017 right heart catheterization [facility] cardiac index 3.7, right atrial pressure 5, RVSP 46, mean PA pressure 31, pulmonary capillary wedge pressure 11, pulmonary vascular resistance 2.2  Records from his physician's office from yesterday (primary care) where he was seen for cellulitis, and a complete blood count was checked and 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     Assessment & Plan:   Pulmonary hypertension, low resistance (HCC) - Plan: ECHOCARDIOGRAM COMPLETE  Swelling - Plan: D-Dimer, Quantitative, VAS [place] LOWER EXTREMITY VENOUS (DVT)  Pulmonary hypertension (HCC)  Shortness of breath  ESRD (end stage renal disease) ([place])  Discussion: Emori is acutely ill-appearing today though has normal vital signs so I do not think he needs to be hospitalized.  However, he has generalized weakness and what appears to be more muscle wasting today compared to previous.  He is complaining of the abrupt onset of shortness of breath with new leg pain and swelling related to calciphylaxis related wound which has been managed by surgery recently.  The differential diagnosis of his  worsening shortness of breath is broad.  I do consider a blood clot despite him taking Eliquis as he is on an attenuated dose and he does seem to have leg swelling on the left leg.  Other considerations would be amiodarone induced lung toxicity, worsening muscle weakness leading to restrictive lung disease in the setting of malnourishment.  I suppose it is also possible that his pulmonary hypertension could have worsened as well though I am not entirely clear why this may have happened.  His anemia also contributes to his shortness of breath but that seems to be stable recently.  Plan: Shortness of breath (new, severe, worsening): We are going to start by looking for evidence of a blood clot and worsening pulmonary hypertension: We will check  a blood test called a d-dimer, check a lower extremity Doppler ultrasound because of your left leg swelling.  If the d-dimer test is elevated then we will need to do a CT angiogram of your chest to look for blood clot in the lungs. We will also check an echocardiogram to see if the pulmonary hypertension has worsened. If these tests are all unrevealing the next week we will need to arrange for a full pulmonary function test  Pulmonary hypertension: Continue ambrisentan Continue tadalafil  History of MRSA infection, multiple skin and wound infections: Follow-up blood culture results at nephrology  We will see you back on Tuesday, nurse practitioner visit   Current Outpatient Medications:  .  albuterol (PROVENTIL) (2.5 MG/3ML) 0.083% nebulizer solution, Take 3 mLs (2.5 mg total) by nebulization every 6 (six) hours as needed for wheezing or shortness of breath., Disp: 360 mL, Rfl: 5 .  ambrisentan (LETAIRIS) 10 MG tablet, Take 1 tablet (10 mg total) by mouth daily., Disp: 30 tablet, Rfl: 11 .  amiodarone (PACERONE) 200 MG tablet, Take 200 mg by mouth daily. , Disp: , Rfl:  .  apixaban (ELIQUIS) 2.5 MG TABS tablet, Take 1 tablet (2.5 mg total) by mouth 2  (two) times daily., Disp: 60 tablet, Rfl: 0 .  atorvastatin (LIPITOR) 40 MG tablet, Take 1 tablet (40 mg total) by mouth every evening., Disp: 30 tablet, Rfl: 2 .  cinacalcet (SENSIPAR) 90 MG tablet, Take 90 mg by mouth every evening., Disp: , Rfl:  .  levothyroxine (SYNTHROID, LEVOTHROID) 300 MCG tablet, Take 1 tablet (300 mcg total) by mouth daily before breakfast., Disp: 90 tablet, Rfl: 3 .  midodrine (PROAMATINE) 10 MG tablet, TAKE 1 TABLET (10 MG TOTAL) BY MOUTH 3 (THREE) TIMES DAILY., Disp: 90 tablet, Rfl: 5 .  multivitamin (RENA-VIT) TABS tablet, TAKE 1 TABLET BY MOUTH EVERYDAY AT BEDTIME (Patient taking differently: Take 1 tablet by mouth at bedtime. ), Disp: 30 tablet, Rfl: 0 .  nystatin (MYCOSTATIN/NYSTOP) powder, Apply topically 4 (four) times daily. (Patient taking differently: Apply 1 g topically daily. ), Disp: 60 g, Rfl: 0 .  tadalafil (ADCIRCA/CIALIS) 20 MG tablet, Take 20 mg by mouth daily. , Disp: , Rfl:  .  traMADol (ULTRAM) 50 MG tablet, Take 1 tablet (50 mg total) by mouth every 6 (six) hours as needed. (Patient taking differently: Take 50 mg by mouth every 6 (six) hours as needed for moderate pain. ), Disp: 20 tablet, Rfl: 0

## 2018-02-11 NOTE — Patient Instructions (Signed)
Shortness of breath: We are going to start by looking for evidence of a blood clot and worsening pulmonary hypertension: We will check a blood test called a d-dimer, check a lower extremity Doppler ultrasound because of your left leg swelling.  If the d-dimer test is elevated then we will need to do a CT angiogram of your chest to look for blood clot in the lungs. We will also check an echocardiogram to see if the pulmonary hypertension has worsened. If these tests are all unrevealing the next week we will need to arrange for a full pulmonary function test  Pulmonary hypertension: Continue ambrisentan Continue tadalafil  History of MRSA infection, multiple skin and wound infections: Follow-up blood culture results at nephrology  We will see you back on Tuesday, nurse practitioner visit

## 2018-02-11 NOTE — Assessment & Plan Note (Addendum)
Patient complains of chills and overall ill feeling for the past few days. He also reports that diarrhea has returned although he has not been on antibiotic therapy for the past few months. Patient has been tested for C.diff twice. Patient also endorse some shortness of breath. He has a history of pulmonary hypertension and is on [place]. Unclear SOB is progression of PH or related possibly to a developing infectious process. Patient had recently a 10 cm left thigh/groin debrided which was suspicious for possible infection ( pseudomonas) but patient declined antibiotic therapy due to fear of recurrent diarrhea.Vitals are within normal limits and patient does not appear toxic. Will hold off on sending patient to the ED.  --Will check CBC with Diff if elevated will recheck for C.diff and will likely start antibiotic regimen.  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  --Follow up appointment with vascular surgery for thigh wound next week 10/10.

## 2018-02-11 NOTE — Assessment & Plan Note (Addendum)
Reported shortness of breath could be secondary to developing infection from multiple wounds vs progression of pulmonary hypertension ( amiodarone toxicity?) vs anemia of chronic disease in an ESRD patient vs cardiac etiology with history of afib currently on amiodarone. O2 sat today in clinic was 97%, no increase WOB with normal lung exam. Patient received EPO today in dialysis will monitor for improvement in breathing. Patient will also get in touch with pulmonologist for reevaluation of [place]. Currently on adcirca and leitaris. Echo from 08/2017 show RVSP of 59, therefore still elevated but improved from echo in 2018. Patient has been on amiodarone for the past few month and it is known to cause pulmonary fibrosis though usually with more chronic use. Patient does have a history of atrial fibrillation but was recently seen by cardiology and started on amiodarone he denies any palpitations or chest pain. Lastly, SOB could be manifestation of developing systemic infection, will follow on WBC with low threshold to start antibiotic.

## 2018-02-11 NOTE — Assessment & Plan Note (Signed)
Difficulty sleeping, likely secondary to multiple medical problems. Recommended trial of melatonin which should not be an issue given patient has ESRD and is on HD. Will reassess in a few days.

## 2018-02-12 ENCOUNTER — Telehealth: Payer: Self-pay

## 2018-02-12 ENCOUNTER — Telehealth: Payer: Self-pay | Admitting: Primary Care

## 2018-02-12 ENCOUNTER — Ambulatory Visit: Payer: Medicare HMO | Admitting: Family Medicine

## 2018-02-12 DIAGNOSIS — I120 Hypertensive chronic kidney disease with stage 5 chronic kidney disease or end stage renal disease: Secondary | ICD-10-CM | POA: Diagnosis not present

## 2018-02-12 DIAGNOSIS — I272 Pulmonary hypertension, unspecified: Secondary | ICD-10-CM | POA: Diagnosis not present

## 2018-02-12 DIAGNOSIS — N186 End stage renal disease: Secondary | ICD-10-CM | POA: Diagnosis not present

## 2018-02-12 DIAGNOSIS — D631 Anemia in chronic kidney disease: Secondary | ICD-10-CM | POA: Diagnosis not present

## 2018-02-12 DIAGNOSIS — R0602 Shortness of breath: Secondary | ICD-10-CM

## 2018-02-12 DIAGNOSIS — D509 Iron deficiency anemia, unspecified: Secondary | ICD-10-CM | POA: Diagnosis not present

## 2018-02-12 DIAGNOSIS — E8770 Fluid overload, unspecified: Secondary | ICD-10-CM | POA: Diagnosis not present

## 2018-02-12 DIAGNOSIS — I080 Rheumatic disorders of both mitral and aortic valves: Secondary | ICD-10-CM | POA: Diagnosis not present

## 2018-02-12 DIAGNOSIS — I480 Paroxysmal atrial fibrillation: Secondary | ICD-10-CM | POA: Diagnosis not present

## 2018-02-12 DIAGNOSIS — N2581 Secondary hyperparathyroidism of renal origin: Secondary | ICD-10-CM | POA: Diagnosis not present

## 2018-02-12 DIAGNOSIS — I9589 Other hypotension: Secondary | ICD-10-CM | POA: Diagnosis not present

## 2018-02-12 DIAGNOSIS — T8189XD Other complications of procedures, not elsewhere classified, subsequent encounter: Secondary | ICD-10-CM | POA: Diagnosis not present

## 2018-02-12 DIAGNOSIS — I255 Ischemic cardiomyopathy: Secondary | ICD-10-CM | POA: Diagnosis not present

## 2018-02-12 LAB — D-DIMER, QUANTITATIVE (NOT AT ARMC): D DIMER QUANT: 2.06 ug{FEU}/mL — AB (ref <0.50)

## 2018-02-12 MED ORDER — PREDNISONE 20 MG PO TABS: ORAL_TABLET | ORAL | 0 refills | Status: DC

## 2018-02-12 MED ORDER — DIPHENHYDRAMINE HCL 50 MG PO TABS: ORAL_TABLET | ORAL | 0 refills | Status: DC

## 2018-02-12 NOTE — Telephone Encounter (Signed)
Called and informed patient that if he became symptomatic with shortness of breath and/or chest pain, increased leg pain and swelling he will need to go to the emergency room for evaluation. Patient voiced understanding. Nothing further is needed at this time.

## 2018-02-12 NOTE — Telephone Encounter (Signed)
Please refer to phone note from earlier today 02/12/18. Pt had elevated d.dimer and will be scheduled for CTa on Monday 02/15/2018. Pt aware to seek emergency care if s/s worsen.   Will route to PCC's to contact pt Monday morning.

## 2018-02-12 NOTE — Telephone Encounter (Signed)
Called and spoke to patient, made patient aware that we were sending medication to his pharmacy. Nothing further needed at this time.

## 2018-02-12 NOTE — Telephone Encounter (Signed)
Patient needs CTA r/o PE if not yet ordered. Elevated d-dimer. McQu[place]id patient. Thanks

## 2018-02-12 NOTE — Telephone Encounter (Signed)
Called and spoke to patient, made aware of elevated d-dimer as well as STAT CT Angio. Voiced understanding. Order placed, nothing further needed at this time.

## 2018-02-13 DIAGNOSIS — N186 End stage renal disease: Secondary | ICD-10-CM | POA: Diagnosis not present

## 2018-02-13 DIAGNOSIS — E669 Obesity, unspecified: Secondary | ICD-10-CM | POA: Diagnosis not present

## 2018-02-13 DIAGNOSIS — A419 Sepsis, unspecified organism: Secondary | ICD-10-CM | POA: Diagnosis not present

## 2018-02-13 DIAGNOSIS — J9601 Acute respiratory failure with hypoxia: Secondary | ICD-10-CM | POA: Diagnosis not present

## 2018-02-13 DIAGNOSIS — I6529 Occlusion and stenosis of unspecified carotid artery: Secondary | ICD-10-CM | POA: Diagnosis not present

## 2018-02-13 DIAGNOSIS — T8189XA Other complications of procedures, not elsewhere classified, initial encounter: Secondary | ICD-10-CM | POA: Diagnosis not present

## 2018-02-13 DIAGNOSIS — Z95810 Presence of automatic (implantable) cardiac defibrillator: Secondary | ICD-10-CM | POA: Diagnosis not present

## 2018-02-13 DIAGNOSIS — R7881 Bacteremia: Secondary | ICD-10-CM | POA: Diagnosis not present

## 2018-02-13 DIAGNOSIS — T829XXA Unspecified complication of cardiac and vascular prosthetic device, implant and graft, initial encounter: Secondary | ICD-10-CM | POA: Diagnosis not present

## 2018-02-13 DIAGNOSIS — T8130XA Disruption of wound, unspecified, initial encounter: Secondary | ICD-10-CM | POA: Diagnosis not present

## 2018-02-15 ENCOUNTER — Other Ambulatory Visit (HOSPITAL_COMMUNITY): Payer: Medicare HMO

## 2018-02-15 ENCOUNTER — Telehealth: Payer: Self-pay | Admitting: Pulmonary Disease

## 2018-02-15 DIAGNOSIS — N2581 Secondary hyperparathyroidism of renal origin: Secondary | ICD-10-CM | POA: Diagnosis not present

## 2018-02-15 DIAGNOSIS — N186 End stage renal disease: Secondary | ICD-10-CM | POA: Diagnosis not present

## 2018-02-15 DIAGNOSIS — E8770 Fluid overload, unspecified: Secondary | ICD-10-CM | POA: Diagnosis not present

## 2018-02-15 DIAGNOSIS — D509 Iron deficiency anemia, unspecified: Secondary | ICD-10-CM | POA: Diagnosis not present

## 2018-02-15 DIAGNOSIS — D631 Anemia in chronic kidney disease: Secondary | ICD-10-CM | POA: Diagnosis not present

## 2018-02-15 NOTE — Telephone Encounter (Signed)
Called and spoke to patient, informed patient that his CT was scheduled for 02/16/2018 at 12:30pm. Instructed patient that he is to have no solid foods 2 hours prior to CT. Went over medications prior to CT due to Dye Allergy, Patient is to take Prednisone 50mg  at 1:30am and 6:30am. Then Prednisione 50mg  and Benadryl 50mg  at 11:30am. Confirmed address to [place]. Patient voiced understanding. Nothing further needed at this time. Also informed patient that he had a Appt with Foot doctor that he probably will need to reschedule. Nothing further is needed at this time.

## 2018-02-15 NOTE — Telephone Encounter (Signed)
On 10.4.19 at approximately 1030 spoke with Adam [name] [facility] who reported that CT was unable to schedule scan because of documented allergy to IVP Dye on patient's chart.  Discussed with Beth NP to determine best course of action Per Adam [name]: please call patient to verify this allergy and let him know that he will need to be premedicated prior to scan and this will delay the timeframe for the scan.  Had called and spoke with the patient at approximately 1300.  Patient did verify that he is allergic to IVP Dye with nausea and has been premedicated in the past with prednisone and benadryl.  Advised pt will obtain specific dosing instructions from [facility] NP to send in medication and call him back once this is completed.  Pt voiced his understanding.

## 2018-02-15 NOTE — Telephone Encounter (Signed)
Called and spoke to patient, Patient goes to dialysis MWF from 11:00am to 3:15pm. Voices it will be best scheduled Tuesday or Thursday due to fatigue from dialysis. States the swelling in his leg remains but has not gotten worse, denies pain, and still has his baseline SOB.

## 2018-02-15 NOTE — Telephone Encounter (Signed)
Called spoke with patient. He is reschedule appointment with Wyn Quaker FNP on Wednesday at [place]. Nothing further at this time

## 2018-02-15 NOTE — Telephone Encounter (Signed)
Spoke with Vallarie Mare, [facility], and was advised the pt has MWF dialysis. Will need to see when pt's dialysis is and if it will affect his premedication prior to CTa. Will also need to see if pt has taken his pre-medication yet since CTa has not been scheduled yet.   LMTCB for pt. When pt calls back pt will need to speak with nurse ASAP.

## 2018-02-16 ENCOUNTER — Ambulatory Visit: Payer: Self-pay | Admitting: Pharmacist Clinician (PhC)/ Clinical Pharmacy Specialist

## 2018-02-16 ENCOUNTER — Ambulatory Visit (HOSPITAL_COMMUNITY)
Admission: RE | Admit: 2018-02-16 | Discharge: 2018-02-16 | Disposition: A | Discharge status: Home / Self Care | Payer: Medicare HMO | Source: Ambulatory Visit | Attending: Primary Care | Admitting: Primary Care

## 2018-02-16 ENCOUNTER — Ambulatory Visit: Payer: Medicare HMO | Admitting: Podiatry

## 2018-02-16 ENCOUNTER — Ambulatory Visit
Admission: RE | Admit: 2018-02-16 | Discharge: 2018-02-16 | Disposition: A | Discharge status: Home / Self Care | Payer: Medicare HMO | Source: Ambulatory Visit | Attending: Primary Care | Admitting: Primary Care

## 2018-02-16 ENCOUNTER — Other Ambulatory Visit: Payer: Self-pay | Admitting: Primary Care

## 2018-02-16 ENCOUNTER — Ambulatory Visit: Payer: Medicare HMO | Admitting: Pulmonary Disease

## 2018-02-16 DIAGNOSIS — I313 Pericardial effusion (noninflammatory): Secondary | ICD-10-CM

## 2018-02-16 DIAGNOSIS — R0602 Shortness of breath: Secondary | ICD-10-CM

## 2018-02-16 DIAGNOSIS — I2510 Atherosclerotic heart disease of native coronary artery without angina pectoris: Secondary | ICD-10-CM | POA: Insufficient documentation

## 2018-02-16 DIAGNOSIS — I480 Paroxysmal atrial fibrillation: Secondary | ICD-10-CM

## 2018-02-16 DIAGNOSIS — A419 Sepsis, unspecified organism: Secondary | ICD-10-CM | POA: Diagnosis not present

## 2018-02-16 DIAGNOSIS — I700 Atherosclerosis of aorta: Secondary | ICD-10-CM | POA: Insufficient documentation

## 2018-02-16 DIAGNOSIS — T8130XA Disruption of wound, unspecified, initial encounter: Secondary | ICD-10-CM | POA: Diagnosis not present

## 2018-02-16 DIAGNOSIS — I517 Cardiomegaly: Secondary | ICD-10-CM | POA: Insufficient documentation

## 2018-02-16 DIAGNOSIS — Z7901 Long term (current) use of anticoagulants: Secondary | ICD-10-CM

## 2018-02-16 MED ORDER — IOPAMIDOL (ISOVUE-370) INJECTION 76%
INTRAVENOUS | Status: AC
  Filled 2018-02-16: qty 100

## 2018-02-16 MED ORDER — SODIUM CHLORIDE 0.9 % IJ SOLN
INTRAMUSCULAR | Status: AC
  Filled 2018-02-16: qty 50

## 2018-02-16 MED ORDER — IOPAMIDOL (ISOVUE-370) INJECTION 76%
100.0000 mL | Freq: Once | INTRAVENOUS | Status: AC | PRN
  Administered 2018-02-16: 100 mL via INTRAVENOUS

## 2018-02-16 MED ORDER — IOPAMIDOL (ISOVUE-370) INJECTION 76%: 80.0000 mL | Freq: Once | INTRAVENOUS | Status: DC | PRN

## 2018-02-16 NOTE — Progress Notes (Signed)
@Patient  ID: Bennye Alm., male    DOB: 1960/07/31, 57 y.o.   MRN: [id number]  Chief Complaint  Patient presents with  . Follow-up    Short of breath, discussed CT Angio results    Referring provider: [name], MD  HPI:  57 year old male patient followed in our office for pulmonary hypertension (diagnosed in 2012).   PMH: Hypertensive glomerular nephritis, kidney transplant 2000, kidney transplant failed in 2009, patient is Monday Wednesday Friday dialysis, A. fib Smoker/ Smoking History:  Maintenance: Letairis, Adcirca Pt of: Dr. Lake Bells  Recent [facility] Encounters:   02/11/2018-office visit-[place] Patient with increased shortness of breath this is new and worsening. Plan: D-dimer, lower extremity Doppler, CTA of your chest if d-dimer positive, echocardiogram stat, if all these tests are unimpressive we will order PFT, continue Letairis and Adcirca, follow-up next week  02/17/2018  - Visit   57 year old male patient presenting today for one-week follow-up visit after seeing Dr. Lake Bells.  Patient's d-dimer was elevated patient completed CT Angio.  We will discuss these results today.  Unfortunately patient has not completed his echocardiogram that was ordered stat from last office visit.  Patient reports that since May/2019 when he had a staph infection which required to significant hospitalizations and procedures patient is not fully recovered.  He reports that prior to that patient was doing well and was quite active.  Patient now has concerns that even after walking 10 feet he gets progressively more short of breath.  Patient reports that he has significant lower extremity swelling, especially in left lower extremity.  Patient is being followed by surgery Dr. [name] for left leg wound.  Patient will see Dr. [name] tomorrow for this.  Patient reports adherence to Monday Wednesday Friday dialysis.  Patient reports that he is actually below his dry weight  and they have not had issues with pulling fluid off.  Of note patient was confused he has not had blood cultures drawn.  Patient has had blood work completed.  Most recent being a CBC.  Showing stable anemia.     Tests:   02/16/2018-CT Angio - no evidence of PE, mild interstitial pulmonary edema, small right pleural effusion and enlarged heart, small pericardial effusion  02/11/2018- vascular lower extremity Doppler normal  6MW March 2017 6 minute walk test at [facility] 385 m, O2 saturation 100% on room air  Echo: March 2017 echocardiogram [facility] RVSP 40 mmHg, LVEF 55% March 2018 echo [facility]: RVSP 29mm Hg 08/2017 Echo RVSP 73mmHg  RHC April 2017 right heart catheterization [facility] cardiac index 3.7, right atrial pressure 5, RVSP 46, mean PA pressure 31, pulmonary capillary wedge pressure 11, pulmonary vascular resistance 2.2  Records from his physician's office from yesterday (primary care) where he was seen for cellulitis, and a complete blood count was checked and 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  FENO:  No results found for: NITRICOXIDE  PFT: No flowsheet data found.  Imaging: Ct Angio Chest W/cm &/or Wo Cm  Result Date: 02/16/2018 CLINICAL DATA:  58 year old male with shortness of breath for the past 6 weeks. Elevated D-dimer. Negative Doppler interrogation of his legs. EXAM: CT ANGIOGRAPHY CHEST WITH CONTRAST TECHNIQUE: Multidetector CT imaging of the chest was performed using the standard protocol during bolus administration of intravenous contrast. Multiplanar CT image reconstructions and MIPs were obtained to evaluate the vascular anatomy. CONTRAST:  121mL ISOVUE-370 IOPAMIDOL (ISOVUE-370) INJECTION 76% COMPARISON:  None. FINDINGS: Cardiovascular: No filling defects in the  pulmonary arterial tree to suggest underlying pulmonary embolism. Heart size is mildly enlarged with right atrial dilatation. Small amount of pericardial fluid and/or  thickening, most evident adjacent to the right atrium, unlikely to be of any hemodynamic significance at this time. No associated pericardial calcification. There is aortic atherosclerosis, as well as atherosclerosis of the great vessels of the mediastinum and the coronary arteries, including calcified atherosclerotic plaque in the left main, left anterior descending, left circumflex and right coronary arteries. Severe calcifications of the aortic valve and mitral annulus. Mediastinum/Nodes: Several prominent borderline enlarged mediastinal lymph nodes are noted, most evident in the superior mediastinum. Esophagus is unremarkable in appearance. No axillary lymphadenopathy. Lungs/Pleura: Trace right pleural effusion lying dependently. No left pleural effusion. Widespread ground-glass attenuation and mild patchy interlobular septal thickening throughout the lungs bilaterally, favored to reflect a background of mild interstitial pulmonary edema. No consolidative airspace disease. No definite suspicious appearing pulmonary nodules or masses are noted. Upper Abdomen: Aortic atherosclerosis. Musculoskeletal: There are no aggressive appearing lytic or blastic lesions noted in the visualized portions of the skeleton. Review of the MIP images confirms the above findings. IMPRESSION: 1. No evidence of pulmonary embolism. 2. Findings in the lungs suggests mild interstitial pulmonary edema. Given the small right pleural effusion and enlarged heart, findings are favored to reflect congestive heart failure. 3. Cardiomegaly with right atrial dilatation. 4. Small pericardial effusion most evident over in the right atrium, unlikely to be of any hemodynamic significance at this time. 5. There are calcifications of the aortic valve and mitral annulus. Echocardiographic correlation for evaluation of potential valvular dysfunction may be warranted if clinically indicated. 6. Aortic atherosclerosis, in addition to left main and 3 vessel  coronary artery disease. Please note that although the presence of coronary artery calcium documents the presence of coronary artery disease, the severity of this disease and any potential stenosis cannot be assessed on this non-gated CT examination. Assessment for potential risk factor modification, dietary therapy or pharmacologic therapy may be warranted, if clinically indicated. Aortic Atherosclerosis (ICD10-I70.0). Electronically Signed   By: Vinnie Langton M.D.   On: 02/16/2018 16:23    Chart Review:    Specialty Problems      Pulmonary Problems   Sinus congestion   Epistaxis    02/2016 ENT assessment> perforated nasal septum      HCAP (healthcare-associated pneumonia)   Acute respiratory failure with hypoxia (HCC)   Shortness of breath      Allergies  Allergen Reactions  . Enalapril Swelling and Other (See Comments)    MOUTH SWELLING EDEMA  . Iodinated Diagnostic Agents Nausea And Vomiting and Nausea Only    Immunization History  Administered Date(s) Administered  . Influenza Split 01/30/2014, 02/19/2016, 12/27/2016  . Influenza Whole 02/10/2015  . Influenza-Unspecified 01/12/2017, 02/08/2018  . Pneumococcal Polysaccharide-23 05/29/2014    Past Medical History:  Diagnosis Date  . AICD (automatic cardioverter/defibrillator) present    [employer] - s/p extraction 08/21/17 due to staph infection  . Anemia   . Aortic stenosis    moderate AS by 08/2016 echo  . Arthritis    "knees" (08/05/2016)  . Atrial fibrillation ([place])   . ESRD (end stage renal disease) on dialysis ([place])    "M, T, W, T, F; NX stage hemodialysis; I do it at home" (08/05/2016)  . 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 History of blood transfusion 2009   "S/P biopsy for prostate cancer check"  . Hypothyroidism (acquired)   .  Mitral stenosis    moderate mitral stenosis  . Nonhealing surgical wound    left thigh  . Nonischemic cardiomyopathy ([place])   .  Pneumonia 2014; 08/05/2016  . Pulmonary HTN ([place])   . Renal insufficiency   . Thyroid cancer ([place]) 2011    Tobacco History: Social History   Tobacco Use  Smoking Status Never Smoker  Smokeless Tobacco Never Used   Counseling given: Not Answered  Continue not smoking  Outpatient Encounter Medications as of 02/17/2018  Medication Sig  . albuterol (PROVENTIL) (2.5 MG/3ML) 0.083% nebulizer solution Take 3 mLs (2.5 mg total) by nebulization every 6 (six) hours as needed for wheezing or shortness of breath.  Marland Kitchen ambrisentan (LETAIRIS) 10 MG tablet Take 1 tablet (10 mg total) by mouth daily.  Marland Kitchen amiodarone (PACERONE) 200 MG tablet Take 200 mg by mouth daily.   Marland Kitchen apixaban (ELIQUIS) 2.5 MG TABS tablet Take 1 tablet (2.5 mg total) by mouth 2 (two) times daily.  Marland Kitchen atorvastatin (LIPITOR) 40 MG tablet Take 1 tablet (40 mg total) by mouth every evening.  . cinacalcet (SENSIPAR) 90 MG tablet Take 90 mg by mouth every evening.  Marland Kitchen levothyroxine (SYNTHROID, LEVOTHROID) 300 MCG tablet Take 1 tablet (300 mcg total) by mouth daily before breakfast.  . midodrine (PROAMATINE) 10 MG tablet TAKE 1 TABLET (10 MG TOTAL) BY MOUTH 3 (THREE) TIMES DAILY.  . multivitamin (RENA-VIT) TABS tablet TAKE 1 TABLET BY MOUTH EVERYDAY AT BEDTIME (Patient taking differently: Take 1 tablet by mouth at bedtime. )  . nystatin (MYCOSTATIN/NYSTOP) powder Apply topically 4 (four) times daily. (Patient taking differently: Apply 1 g topically daily. )  . tadalafil (ADCIRCA/CIALIS) 20 MG tablet Take 20 mg by mouth daily.   . traMADol (ULTRAM) 50 MG tablet Take 1 tablet (50 mg total) by mouth every 6 (six) hours as needed. (Patient taking differently: Take 50 mg by mouth every 6 (six) hours as needed for moderate pain. )  . diphenhydrAMINE (BENADRYL) 50 MG tablet Take 1 tablet by mouth 1 hour prior to study (Patient not taking: Reported on 02/17/2018)  . predniSONE (DELTASONE) 20 MG tablet Take 2 and 1/2 tablets (50mg ) by mouth 13 hours, 7  hours, and 1 hour prior to study. (Patient not taking: Reported on 02/17/2018)  . [DISCONTINUED] iopamidol (ISOVUE-370) 76 % injection 80 mL   . [DISCONTINUED] iopamidol (ISOVUE-370) 76 % injection   . [DISCONTINUED] sodium chloride 0.9 % injection    No facility-administered encounter medications on file as of 02/17/2018.      Review of Systems  Review of Systems  Constitutional: Positive for activity change and fatigue. Negative for chills, fever and unexpected weight change.  HENT: Positive for congestion. Negative for postnasal drip, rhinorrhea, sinus pressure, sinus pain, sneezing and sore throat.   Eyes: Negative.   Respiratory: Positive for shortness of breath. Negative for cough and wheezing.   Cardiovascular: Positive for leg swelling. Negative for chest pain and palpitations.  Gastrointestinal: Negative for constipation, diarrhea, nausea and vomiting.  Endocrine: Negative.   Musculoskeletal: Negative.   Skin: Positive for wound (LLE wound, chronic, being followed by Surgery ).  Allergic/Immunologic: Positive for environmental allergies.  Neurological: Negative for dizziness and headaches.  Psychiatric/Behavioral: Negative.  Negative for dysphoric mood. The patient is not nervous/anxious.   All other systems reviewed and are negative.    Physical Exam  BP 98/62 (BP Location: Right Arm, Cuff Size: Normal)   Pulse 96   Ht 6\' 1"  (1.854 m)   Wt 272  lb 12.8 oz (123.7 kg)   SpO2 100%   BMI 35.99 kg/m   Wt Readings from Last 5 Encounters:  02/17/18 272 lb 12.8 oz (123.7 kg)  02/11/18 272 lb 6.4 oz (123.6 kg)  02/10/18 271 lb (122.9 kg)  01/28/18 265 lb (120.2 kg)  01/27/18 272 lb (123.4 kg)     Physical Exam  Constitutional: He is oriented to person, place, and time and well-developed, well-nourished, and in no distress. No distress.  HENT:  Head: Normocephalic and atraumatic.  Right Ear: Hearing and external ear normal.  Left Ear: Hearing and external ear normal.    Nose: Nose normal. Right sinus exhibits no maxillary sinus tenderness and no frontal sinus tenderness. Left sinus exhibits no maxillary sinus tenderness and no frontal sinus tenderness.  Mouth/Throat: Uvula is midline and oropharynx is clear and moist. No oropharyngeal exudate.  Unable to visualize TMs due to cerumen occluding canals bilaterally  Eyes: Pupils are equal, round, and reactive to light.  Neck: Normal range of motion. Neck supple. No JVD present.  Cardiovascular: Normal rate, regular rhythm and normal heart sounds.  Pulmonary/Chest: Effort normal and breath sounds normal. No accessory muscle usage. No respiratory distress. He has no decreased breath sounds. He has no wheezes. He has no rhonchi.  Abdominal: Soft. Bowel sounds are normal. There is no tenderness.  Musculoskeletal: Normal range of motion. He exhibits edema.  Lymphadenopathy:    He has cervical adenopathy.       Right cervical: Superficial cervical adenopathy present.       Left cervical: Superficial cervical adenopathy present.  Neurological: He is alert and oriented to person, place, and time. Gait normal.  Skin: Skin is warm and dry. He is not diaphoretic.  Severely dry flaky skin on LE bilaterally  Psychiatric: Mood, memory, affect and judgment normal.  Nursing note and vitals reviewed.     Lab Results:  CBC    Component Value Date/Time   WBC 5.8 02/10/2018 1556   WBC 5.0 01/28/2018 0608   RBC 3.26 (L) 02/10/2018 1556   RBC 2.99 (L) 01/28/2018 0608   HGB 8.8 (L) 02/10/2018 1556   HCT 28.2 (L) 02/10/2018 1556   PLT 122 (L) 02/10/2018 1556   MCV 87 02/10/2018 1556   MCH 27.0 02/10/2018 1556   MCH 28.1 01/28/2018 0608   MCHC 31.2 (L) 02/10/2018 1556   MCHC 30.0 01/28/2018 0608   RDW 17.7 (H) 02/10/2018 1556   LYMPHSABS 0.5 (L) 02/10/2018 1556   MONOABS 0.4 01/02/2018 1721   EOSABS 0.1 02/10/2018 1556   BASOSABS 0.0 02/10/2018 1556    BMET    Component Value Date/Time   NA 135 01/28/2018 0608    K 4.2 01/28/2018 0608   CL 96 (L) 01/28/2018 0608   CO2 23 01/28/2018 0608   GLUCOSE 64 (L) 01/28/2018 0608   BUN 27 (H) 01/28/2018 0608   CREATININE 7.74 (H) 01/28/2018 0608   CREATININE 4.91 (H) 08/13/2015 1223   CALCIUM 9.2 01/28/2018 0608   GFRNONAA 7 (L) 01/28/2018 0608   GFRNONAA 13 (L) 07/27/2014 1539   GFRAA 8 (L) 01/28/2018 0608   GFRAA 15 (L) 07/27/2014 1539    BNP No results found for: BNP  ProBNP No results found for: PROBNP    Assessment & Plan:   Pleasant 57 year old patient seen for follow-up visit today.  Unfortunately the patient has not completed his echocardiogram that was ordered on 02/11/2018.  Patient with persistent shortness of breath.  Patient has completed CT angio  which was negative for PE, it did show trace pleural effusions as well as pericardial effusion.  Patient completed lower extremity Dopplers which were also negative.  Patient to complete dialysis today.  Patient to continue follow-up with surgical team for his left lower leg wound.  I explained to the patient that if echocardiogram is stable from April/2019 we will need to order pulmonary function testing to check for worsening restriction.  Patient agrees.  We will have patient follow-up with our office in 1-2 weeks.  Pulmonary hypertension ([place]) Get echocardiogram completed   Follow up in 1-2 weeks with our office   Continue ambrisentan Continue tadalafil  ESRD (end stage renal disease) on hemodialysis Complete dialysis today    Wound infection Complete follow up tomm with Surgery for left leg wound    This appointment was 28 minutes along with her 50% that time in direct face-to-face patient care, assessment, plan of care follow-up   Lauraine Rinne, NP 02/17/2018

## 2018-02-17 ENCOUNTER — Ambulatory Visit: Payer: Medicare HMO | Admitting: Pulmonary Disease

## 2018-02-17 ENCOUNTER — Encounter: Payer: Self-pay | Admitting: Pulmonary Disease

## 2018-02-17 DIAGNOSIS — T148XXA Other injury of unspecified body region, initial encounter: Secondary | ICD-10-CM

## 2018-02-17 DIAGNOSIS — E8770 Fluid overload, unspecified: Secondary | ICD-10-CM | POA: Diagnosis not present

## 2018-02-17 DIAGNOSIS — N186 End stage renal disease: Secondary | ICD-10-CM | POA: Diagnosis not present

## 2018-02-17 DIAGNOSIS — L089 Local infection of the skin and subcutaneous tissue, unspecified: Secondary | ICD-10-CM | POA: Diagnosis not present

## 2018-02-17 DIAGNOSIS — D509 Iron deficiency anemia, unspecified: Secondary | ICD-10-CM | POA: Diagnosis not present

## 2018-02-17 DIAGNOSIS — I272 Pulmonary hypertension, unspecified: Secondary | ICD-10-CM | POA: Diagnosis not present

## 2018-02-17 DIAGNOSIS — D631 Anemia in chronic kidney disease: Secondary | ICD-10-CM | POA: Diagnosis not present

## 2018-02-17 DIAGNOSIS — N2581 Secondary hyperparathyroidism of renal origin: Secondary | ICD-10-CM | POA: Diagnosis not present

## 2018-02-17 NOTE — Assessment & Plan Note (Addendum)
Get echocardiogram completed  >>> If echo is stable with no changes from April/2019 schedule PFT  Follow up in 1-2 weeks with our office   Continue ambrisentan Continue tadalafil

## 2018-02-17 NOTE — Patient Instructions (Addendum)
Get echocardiogram completed   Complete dialysis today   Complete follow up tomm with Surgery for left leg wound   Follow up in 1-2 weeks with our office   Continue ambrisentan Continue tadalafil   November/2019 we will be moving! We will no longer be at our [place] location.  Be on the look out for a post card/mailer to let you know we have officially moved.  Our new address and phone number will be:  [address] [place], [place] [address] Telephone number: [phone]  It is flu season:   >>>Remember to be washing your hands regularly, using hand sanitizer, be careful to use around herself with has contact with people who are sick will increase her chances of getting sick yourself. >>> Best ways to protect herself from the flu: Receive the yearly flu vaccine, practice good hand hygiene washing with soap and also using hand sanitizer when available, eat a nutritious meals, get adequate rest, hydrate appropriately   Please contact the office if your symptoms worsen or you have concerns that you are not improving.   Thank you for choosing [facility] for your healthcare, and for allowing [place] to partner with you on your healthcare journey. I am thankful to be able to provide care to you today.   Wyn Quaker FNP-C

## 2018-02-17 NOTE — Assessment & Plan Note (Signed)
Complete follow up tomm with Surgery for left leg wound

## 2018-02-17 NOTE — Assessment & Plan Note (Signed)
Complete dialysis today

## 2018-02-18 ENCOUNTER — Inpatient Hospital Stay (HOSPITAL_COMMUNITY)
Admission: EM | Admit: 2018-02-18 | Discharge: 2018-02-26 | DRG: 853 | Disposition: A | Discharge status: Home Health | Payer: Medicare HMO | Attending: Family Medicine | Admitting: Family Medicine

## 2018-02-18 ENCOUNTER — Emergency Department (HOSPITAL_COMMUNITY): Payer: Medicare HMO

## 2018-02-18 ENCOUNTER — Other Ambulatory Visit: Payer: Self-pay

## 2018-02-18 ENCOUNTER — Other Ambulatory Visit (HOSPITAL_COMMUNITY): Payer: Medicare HMO

## 2018-02-18 ENCOUNTER — Ambulatory Visit: Payer: Medicare HMO

## 2018-02-18 DIAGNOSIS — E890 Postprocedural hypothyroidism: Secondary | ICD-10-CM | POA: Diagnosis present

## 2018-02-18 DIAGNOSIS — I120 Hypertensive chronic kidney disease with stage 5 chronic kidney disease or end stage renal disease: Secondary | ICD-10-CM | POA: Diagnosis not present

## 2018-02-18 DIAGNOSIS — R0602 Shortness of breath: Secondary | ICD-10-CM | POA: Diagnosis not present

## 2018-02-18 DIAGNOSIS — E8770 Fluid overload, unspecified: Secondary | ICD-10-CM | POA: Diagnosis not present

## 2018-02-18 DIAGNOSIS — L02211 Cutaneous abscess of abdominal wall: Secondary | ICD-10-CM | POA: Diagnosis not present

## 2018-02-18 DIAGNOSIS — L7682 Other postprocedural complications of skin and subcutaneous tissue: Secondary | ICD-10-CM | POA: Diagnosis not present

## 2018-02-18 DIAGNOSIS — T148XXA Other injury of unspecified body region, initial encounter: Secondary | ICD-10-CM

## 2018-02-18 DIAGNOSIS — M726 Necrotizing fasciitis: Secondary | ICD-10-CM | POA: Diagnosis not present

## 2018-02-18 DIAGNOSIS — I96 Gangrene, not elsewhere classified: Secondary | ICD-10-CM | POA: Diagnosis not present

## 2018-02-18 DIAGNOSIS — D509 Iron deficiency anemia, unspecified: Secondary | ICD-10-CM | POA: Diagnosis not present

## 2018-02-18 DIAGNOSIS — R000 Tachycardia, unspecified: Secondary | ICD-10-CM | POA: Diagnosis not present

## 2018-02-18 DIAGNOSIS — Z992 Dependence on renal dialysis: Secondary | ICD-10-CM | POA: Diagnosis not present

## 2018-02-18 DIAGNOSIS — I313 Pericardial effusion (noninflammatory): Secondary | ICD-10-CM | POA: Diagnosis present

## 2018-02-18 DIAGNOSIS — I5023 Acute on chronic systolic (congestive) heart failure: Secondary | ICD-10-CM | POA: Diagnosis present

## 2018-02-18 DIAGNOSIS — Z841 Family history of disorders of kidney and ureter: Secondary | ICD-10-CM

## 2018-02-18 DIAGNOSIS — I4891 Unspecified atrial fibrillation: Secondary | ICD-10-CM | POA: Diagnosis not present

## 2018-02-18 DIAGNOSIS — I5030 Unspecified diastolic (congestive) heart failure: Secondary | ICD-10-CM | POA: Diagnosis not present

## 2018-02-18 DIAGNOSIS — S21201A Unspecified open wound of right back wall of thorax without penetration into thoracic cavity, initial encounter: Secondary | ICD-10-CM | POA: Diagnosis not present

## 2018-02-18 DIAGNOSIS — Z7901 Long term (current) use of anticoagulants: Secondary | ICD-10-CM

## 2018-02-18 DIAGNOSIS — I480 Paroxysmal atrial fibrillation: Secondary | ICD-10-CM | POA: Diagnosis present

## 2018-02-18 DIAGNOSIS — E1122 Type 2 diabetes mellitus with diabetic chronic kidney disease: Secondary | ICD-10-CM | POA: Diagnosis present

## 2018-02-18 DIAGNOSIS — R52 Pain, unspecified: Secondary | ICD-10-CM | POA: Diagnosis not present

## 2018-02-18 DIAGNOSIS — T8131XA Disruption of external operation (surgical) wound, not elsewhere classified, initial encounter: Secondary | ICD-10-CM | POA: Diagnosis present

## 2018-02-18 DIAGNOSIS — I2729 Other secondary pulmonary hypertension: Secondary | ICD-10-CM | POA: Diagnosis present

## 2018-02-18 DIAGNOSIS — Z8249 Family history of ischemic heart disease and other diseases of the circulatory system: Secondary | ICD-10-CM

## 2018-02-18 DIAGNOSIS — I493 Ventricular premature depolarization: Secondary | ICD-10-CM | POA: Diagnosis present

## 2018-02-18 DIAGNOSIS — Z6835 Body mass index (BMI) 35.0-35.9, adult: Secondary | ICD-10-CM

## 2018-02-18 DIAGNOSIS — D631 Anemia in chronic kidney disease: Secondary | ICD-10-CM | POA: Diagnosis present

## 2018-02-18 DIAGNOSIS — I9589 Other hypotension: Secondary | ICD-10-CM | POA: Diagnosis present

## 2018-02-18 DIAGNOSIS — A419 Sepsis, unspecified organism: Secondary | ICD-10-CM | POA: Diagnosis not present

## 2018-02-18 DIAGNOSIS — L089 Local infection of the skin and subcutaneous tissue, unspecified: Secondary | ICD-10-CM | POA: Diagnosis not present

## 2018-02-18 DIAGNOSIS — I428 Other cardiomyopathies: Secondary | ICD-10-CM | POA: Diagnosis not present

## 2018-02-18 DIAGNOSIS — I083 Combined rheumatic disorders of mitral, aortic and tricuspid valves: Secondary | ICD-10-CM | POA: Diagnosis present

## 2018-02-18 DIAGNOSIS — N186 End stage renal disease: Secondary | ICD-10-CM | POA: Diagnosis not present

## 2018-02-18 DIAGNOSIS — J811 Chronic pulmonary edema: Secondary | ICD-10-CM | POA: Diagnosis not present

## 2018-02-18 DIAGNOSIS — M170 Bilateral primary osteoarthritis of knee: Secondary | ICD-10-CM | POA: Diagnosis present

## 2018-02-18 DIAGNOSIS — E162 Hypoglycemia, unspecified: Secondary | ICD-10-CM

## 2018-02-18 DIAGNOSIS — Z940 Kidney transplant status: Secondary | ICD-10-CM

## 2018-02-18 DIAGNOSIS — Z8701 Personal history of pneumonia (recurrent): Secondary | ICD-10-CM

## 2018-02-18 DIAGNOSIS — Z91041 Radiographic dye allergy status: Secondary | ICD-10-CM

## 2018-02-18 DIAGNOSIS — I132 Hypertensive heart and chronic kidney disease with heart failure and with stage 5 chronic kidney disease, or end stage renal disease: Secondary | ICD-10-CM | POA: Diagnosis present

## 2018-02-18 DIAGNOSIS — E876 Hypokalemia: Secondary | ICD-10-CM | POA: Diagnosis not present

## 2018-02-18 DIAGNOSIS — T8130XA Disruption of wound, unspecified, initial encounter: Secondary | ICD-10-CM | POA: Diagnosis present

## 2018-02-18 DIAGNOSIS — K921 Melena: Secondary | ICD-10-CM | POA: Diagnosis not present

## 2018-02-18 DIAGNOSIS — E785 Hyperlipidemia, unspecified: Secondary | ICD-10-CM | POA: Diagnosis present

## 2018-02-18 DIAGNOSIS — R0902 Hypoxemia: Secondary | ICD-10-CM | POA: Diagnosis not present

## 2018-02-18 DIAGNOSIS — T8140XA Infection following a procedure, unspecified, initial encounter: Secondary | ICD-10-CM | POA: Diagnosis not present

## 2018-02-18 DIAGNOSIS — Z85850 Personal history of malignant neoplasm of thyroid: Secondary | ICD-10-CM

## 2018-02-18 DIAGNOSIS — N2581 Secondary hyperparathyroidism of renal origin: Secondary | ICD-10-CM | POA: Diagnosis present

## 2018-02-18 DIAGNOSIS — I959 Hypotension, unspecified: Secondary | ICD-10-CM | POA: Diagnosis not present

## 2018-02-18 DIAGNOSIS — E669 Obesity, unspecified: Secondary | ICD-10-CM | POA: Diagnosis present

## 2018-02-18 DIAGNOSIS — Y838 Other surgical procedures as the cause of abnormal reaction of the patient, or of later complication, without mention of misadventure at the time of the procedure: Secondary | ICD-10-CM | POA: Diagnosis present

## 2018-02-18 DIAGNOSIS — R58 Hemorrhage, not elsewhere classified: Secondary | ICD-10-CM | POA: Diagnosis not present

## 2018-02-18 DIAGNOSIS — Z79899 Other long term (current) drug therapy: Secondary | ICD-10-CM

## 2018-02-18 DIAGNOSIS — I482 Chronic atrial fibrillation, unspecified: Secondary | ICD-10-CM | POA: Diagnosis not present

## 2018-02-18 DIAGNOSIS — Z888 Allergy status to other drugs, medicaments and biological substances status: Secondary | ICD-10-CM

## 2018-02-18 DIAGNOSIS — Z7401 Bed confinement status: Secondary | ICD-10-CM

## 2018-02-18 DIAGNOSIS — E7800 Pure hypercholesterolemia, unspecified: Secondary | ICD-10-CM | POA: Diagnosis not present

## 2018-02-18 DIAGNOSIS — Z8546 Personal history of malignant neoplasm of prostate: Secondary | ICD-10-CM

## 2018-02-18 DIAGNOSIS — Z89429 Acquired absence of other toe(s), unspecified side: Secondary | ICD-10-CM

## 2018-02-18 DIAGNOSIS — Z95810 Presence of automatic (implantable) cardiac defibrillator: Secondary | ICD-10-CM

## 2018-02-18 DIAGNOSIS — I272 Pulmonary hypertension, unspecified: Secondary | ICD-10-CM | POA: Diagnosis not present

## 2018-02-18 DIAGNOSIS — Z79890 Hormone replacement therapy (postmenopausal): Secondary | ICD-10-CM

## 2018-02-18 HISTORY — DX: Cutaneous abscess, unspecified: L02.91

## 2018-02-18 LAB — COMPREHENSIVE METABOLIC PANEL
ALBUMIN: 2.6 g/dL — AB (ref 3.5–5.0)
ALK PHOS: 208 U/L — AB (ref 38–126)
ALT: 12 U/L (ref 0–44)
ANION GAP: 13 (ref 5–15)
AST: 34 U/L (ref 15–41)
BUN: 16 mg/dL (ref 6–20)
CO2: 30 mmol/L (ref 22–32)
Calcium: 9.4 mg/dL (ref 8.9–10.3)
Chloride: 94 mmol/L — ABNORMAL LOW (ref 98–111)
Creatinine, Ser: 4.35 mg/dL — ABNORMAL HIGH (ref 0.61–1.24)
GFR calc non Af Amer: 14 mL/min — ABNORMAL LOW (ref >60)
GFR, EST AFRICAN AMERICAN: 16 mL/min — AB (ref >60)
GLUCOSE: 75 mg/dL (ref 70–99)
Potassium: 2.9 mmol/L — ABNORMAL LOW (ref 3.5–5.1)
SODIUM: 137 mmol/L (ref 135–145)
Total Bilirubin: 0.4 mg/dL (ref 0.3–1.2)
Total Protein: 6 g/dL — ABNORMAL LOW (ref 6.5–8.1)

## 2018-02-18 LAB — CBC WITH DIFFERENTIAL/PLATELET
ABS IMMATURE GRANULOCYTES: 0.06 10*3/uL (ref 0.00–0.07)
BASOS ABS: 0 10*3/uL (ref 0.0–0.1)
Basophils Relative: 0 %
EOS ABS: 0 10*3/uL (ref 0.0–0.5)
Eosinophils Relative: 0 %
HEMATOCRIT: 27.5 % — AB (ref 39.0–52.0)
Hemoglobin: 8.5 g/dL — ABNORMAL LOW (ref 13.0–17.0)
IMMATURE GRANULOCYTES: 1 %
LYMPHS ABS: 0.5 10*3/uL — AB (ref 0.7–4.0)
LYMPHS PCT: 7 %
MCH: 27.7 pg (ref 26.0–34.0)
MCHC: 30.9 g/dL (ref 30.0–36.0)
MCV: 89.6 fL (ref 80.0–100.0)
MONOS PCT: 6 %
Monocytes Absolute: 0.5 10*3/uL (ref 0.1–1.0)
NEUTROS ABS: 7.1 10*3/uL (ref 1.7–7.7)
NEUTROS PCT: 86 %
NRBC: 0.4 % — AB (ref 0.0–0.2)
PLATELETS: 192 10*3/uL (ref 150–400)
RBC: 3.07 MIL/uL — ABNORMAL LOW (ref 4.22–5.81)
RDW: 22 % — AB (ref 11.5–15.5)
WBC: 8.2 10*3/uL (ref 4.0–10.5)

## 2018-02-18 LAB — I-STAT CHEM 8, ED
BUN: 17 mg/dL (ref 6–20)
CHLORIDE: 98 mmol/L (ref 98–111)
CREATININE: 4.1 mg/dL — AB (ref 0.61–1.24)
Calcium, Ion: 1.15 mmol/L (ref 1.15–1.40)
GLUCOSE: 67 mg/dL — AB (ref 70–99)
HEMATOCRIT: 31 % — AB (ref 39.0–52.0)
Hemoglobin: 10.5 g/dL — ABNORMAL LOW (ref 13.0–17.0)
Potassium: 2.7 mmol/L — CL (ref 3.5–5.1)
Sodium: 138 mmol/L (ref 135–145)
TCO2: 27 mmol/L (ref 22–32)

## 2018-02-18 LAB — I-STAT CG4 LACTIC ACID, ED
Lactic Acid, Venous: 1.93 mmol/L — ABNORMAL HIGH (ref 0.5–1.9)
Lactic Acid, Venous: 1.66 mmol/L (ref 0.5–1.9)

## 2018-02-18 LAB — GLUCOSE, CAPILLARY: GLUCOSE-CAPILLARY: 57 mg/dL — AB (ref 70–99)

## 2018-02-18 LAB — CBG MONITORING, ED: Glucose-Capillary: 60 mg/dL — ABNORMAL LOW (ref 70–99)

## 2018-02-18 LAB — MRSA PCR SCREENING: MRSA BY PCR: NEGATIVE

## 2018-02-18 MED ORDER — VANCOMYCIN HCL 10 G IV SOLR
2000.0000 mg | Freq: Once | INTRAVENOUS | Status: AC
  Administered 2018-02-18: 2000 mg via INTRAVENOUS
  Filled 2018-02-18: qty 2000

## 2018-02-18 MED ORDER — VANCOMYCIN HCL IN DEXTROSE 1-5 GM/200ML-% IV SOLN: 1000.0000 mg | Freq: Once | INTRAVENOUS | Status: DC

## 2018-02-18 MED ORDER — CHLORHEXIDINE GLUCONATE CLOTH 2 % EX PADS
6.0000 | MEDICATED_PAD | Freq: Every day | CUTANEOUS | Status: DC
  Administered 2018-02-25: 6 via TOPICAL

## 2018-02-18 MED ORDER — TRAMADOL HCL 50 MG PO TABS
50.0000 mg | ORAL_TABLET | Freq: Four times a day (QID) | ORAL | Status: DC | PRN
  Administered 2018-02-18 – 2018-02-23 (×5): 50 mg via ORAL
  Filled 2018-02-18 (×5): qty 1

## 2018-02-18 MED ORDER — ATORVASTATIN CALCIUM 40 MG PO TABS
40.0000 mg | ORAL_TABLET | Freq: Every evening | ORAL | Status: DC
  Administered 2018-02-18 – 2018-02-25 (×8): 40 mg via ORAL
  Filled 2018-02-18 (×7): qty 1

## 2018-02-18 MED ORDER — CHLORHEXIDINE GLUCONATE CLOTH 2 % EX PADS
6.0000 | MEDICATED_PAD | Freq: Every day | CUTANEOUS | Status: DC
  Administered 2018-02-20 – 2018-02-25 (×6): 6 via TOPICAL

## 2018-02-18 MED ORDER — DOXERCALCIFEROL 4 MCG/2ML IV SOLN
INTRAVENOUS | Status: AC
  Administered 2018-02-18: 1 ug via INTRAVENOUS
  Filled 2018-02-18: qty 2

## 2018-02-18 MED ORDER — DOXERCALCIFEROL 4 MCG/2ML IV SOLN
1.0000 ug | INTRAVENOUS | Status: DC
  Administered 2018-02-18 – 2018-02-24 (×4): 1 ug via INTRAVENOUS
  Filled 2018-02-18 (×4): qty 2

## 2018-02-18 MED ORDER — SODIUM CHLORIDE 0.9 % IV SOLN
2.0000 g | INTRAVENOUS | Status: DC
  Administered 2018-02-18 – 2018-02-19 (×2): 2 g via INTRAVENOUS
  Filled 2018-02-18 (×3): qty 20

## 2018-02-18 MED ORDER — MIDODRINE HCL 5 MG PO TABS
ORAL_TABLET | ORAL | Status: AC
  Filled 2018-02-18: qty 2

## 2018-02-18 MED ORDER — VANCOMYCIN HCL IN DEXTROSE 1-5 GM/200ML-% IV SOLN
INTRAVENOUS | Status: AC
  Administered 2018-02-18: 1000 mg via INTRAVENOUS
  Filled 2018-02-18: qty 200

## 2018-02-18 MED ORDER — VANCOMYCIN HCL IN DEXTROSE 1-5 GM/200ML-% IV SOLN
1000.0000 mg | INTRAVENOUS | Status: DC
  Administered 2018-02-18: 1000 mg via INTRAVENOUS
  Filled 2018-02-18: qty 200

## 2018-02-18 MED ORDER — CALCIUM ACETATE (PHOS BINDER) 667 MG PO CAPS
1334.0000 mg | ORAL_CAPSULE | Freq: Three times a day (TID) | ORAL | Status: DC
  Filled 2018-02-18 (×3): qty 2

## 2018-02-18 MED ORDER — FENTANYL CITRATE (PF) 100 MCG/2ML IJ SOLN
25.0000 ug | Freq: Once | INTRAMUSCULAR | Status: AC
  Administered 2018-02-18: 25 ug via INTRAVENOUS
  Filled 2018-02-18: qty 2

## 2018-02-18 MED ORDER — LEVOTHYROXINE SODIUM 100 MCG PO TABS
300.0000 ug | ORAL_TABLET | Freq: Every day | ORAL | Status: DC
  Administered 2018-02-19 – 2018-02-26 (×7): 300 ug via ORAL
  Filled 2018-02-18 (×2): qty 3
  Filled 2018-02-18: qty 6
  Filled 2018-02-18 (×6): qty 3

## 2018-02-18 MED ORDER — SODIUM CHLORIDE 0.9 % IV BOLUS
500.0000 mL | Freq: Once | INTRAVENOUS | Status: AC
  Administered 2018-02-18: 500 mL via INTRAVENOUS

## 2018-02-18 MED ORDER — MIDODRINE HCL 5 MG PO TABS
10.0000 mg | ORAL_TABLET | Freq: Three times a day (TID) | ORAL | Status: DC
  Administered 2018-02-18 – 2018-02-26 (×21): 10 mg via ORAL
  Filled 2018-02-18 (×20): qty 2

## 2018-02-18 MED ORDER — CINACALCET HCL 30 MG PO TABS
90.0000 mg | ORAL_TABLET | ORAL | Status: DC
  Administered 2018-02-19 – 2018-02-24 (×3): 90 mg via ORAL
  Filled 2018-02-18 (×3): qty 3

## 2018-02-18 MED ORDER — DEXTROSE 50 % IV SOLN
50.0000 mL | Freq: Once | INTRAVENOUS | Status: AC
  Administered 2018-02-18: 50 mL via INTRAVENOUS
  Filled 2018-02-18: qty 50

## 2018-02-18 NOTE — H&P (Signed)
[facility] Admission History and Physical Service Pager: [phone]  Patient name: Adam [name]. Medical record number: [id number] Date of birth: 1960-05-30 Age: 57 y.o. Gender: male  Primary Care Provider: [name], MD Consultants: nephro, VVS Code Status: FULL  Chief Complaint: bleeding from wound  Assessment and Plan: Adam [name]. is a 57 y.o. male presenting with bleeding from L groin wound. PMH is significant for ESRD on dialysis MWF, paroxymal atrial fibrillation on eliquis, pulmonary hypertension, aortic stenosis, s/p thyroidectomy in 2011, and calciphylaxis.   Sepsis: patient hypotensive on arrival with hx of wound infections. Lactic acid WNL. S/p 500cc bolus x 2 in ED. Of note, patient hypotensive at home on midodrine daily, which he did not take today. S/p ceftriaxone and vancomycin in ED. Blood cultures drawn, unable to produce urine for culture. Presumed source would be leg wound, although patient also has LUE access (nontender no signs of infection) and 2 recent cysts removals over his back as alternative sources.  -admit to stepdown Dr. Andria Frames attending -monitor BP -continue ceftriaxone and vanc per pharmacy -consider broadening to pseudomonas coverage if clinically not improving  Hypotension: MAP 63-65, on home midodrine but didn't take today. Sepsis as possible confounder. However, mentating well.  -dose midodrine now -monitor BPs -stepdown as above  L groin wound: follows with VVS due to h/o groin access with subsequent infection and removal. Had OR debridement 3 weeks ago.  -appreciate VVS recs, they will follow  -santyl for enzymatic debridgement  Afib: regular rhythm on my exam with some PVCs. Hx of ICD which was removed 2/2 seeding infection.  -hold eliquis given bleeding -restart eliquis as able  SOB: reports hx of pulmonary HTN. Was scheduled for echo today. Last echo 4/19 with RV pressure and volume  overload, +AS with mean gradient 17mmHg.  -TTE ordered -consider TEE if blood cultures positive -hold pulm HTN meds while hypotensive  ESRD: Stable. Receives hemodialysis on MWF at [facility], via [facility] graft on L upper arm. Last HD session yesterday.  -nephro plans to dialyze in am -Monitor BMP   Hypokalemia: nephro aware, plans to dialyze in am.  -management per neprho  Calciphylaxis: reports recent removal of "cysts from calcium" over R flank and L posterior shoulder/axilla. Nontender, bandaged.  -wound consult -monitor pain    Thyroid cancer s/p thyroidectomy: In 2011, takes levothyroxine at home. Last TSH 0.6. -Cont home levothyroxine 300 mcg -recheck TSH in am  Anemia: Stable, baseline appears to be 8-9. Likely anemia of chronic disease in the setting of ESRD.  -Monitor CBC in the am or PRN more bleeding  Previous toe amputation: Stable, no concerns for infection. No intervention needed.   Hyperlipidemia: Takes atorvastatin 40mg  at home. Lipid panel in 2017 with LDL 81.  -Cont home atorvastatin  FEN/GI: renal diet Prophylaxis: SCDs  Disposition: admit to stepdown  History of Present Illness:  Adam [name]. is a 57 y.o. male presenting with bleeding from wound.   Patient reports hx of groin access, removed in May. This morning, while going to bathroom (having a BM), an "arterial spot sprung a leak and started spraying blood everywhere." This is the third time he has had a bleed from this area. 3 weeks ago they went into the OR and cleaned out. Reports hx of infection to this area, hx of having IV abx at HD. The last few days, he has been feeling SOB. SOB is relatively subacute in onset, thinks it began in  April or May after onset of this access problem. Was scheduled for echo this morning. No change in SOB when lying flat. Worse with walking around per him, no change with exertion in terms of light lifting. No fevers, but yes chills for a couple of weeks. Didn't take  eliquis this am.   Also notes constant diarrhea x couple of weeks. Diarrhea looks like stool, no blood. No hx of C diff, recently tested for this and negative. Reports it looked like tar this am. No hx of colonoscopy, was trying to get one. Reports hx of rectal bleeding w antibiotics in the past.   Hypotension - reports BP 80-100 normally at home. Sometimes gets lower when he has HD. Takes midodrine daily and didn't take it yet this am.   Review Of Systems: Per HPI with the following additions: denies HA, vision changes, CP, abd pain, does not urinate.   ROS  Patient Active Problem List   Diagnosis Date Noted  . Insomnia 02/11/2018  . Chills 02/11/2018  . Shortness of breath 02/11/2018  . Leukopenia   . Wound infection 01/02/2018  . Nodule of chest wall 11/18/2017  . Calciphylaxis   . Abscess   . Acute pain of right knee   . Malnutrition of moderate degree 11/04/2017  . Septic arthritis of knee, right ([place]) 11/02/2017  . Dialysis AV fistula infection, sequela 11/02/2017  . Mass 10/29/2017  . Near syncope 09/24/2017  . Syncope, cardiogenic   . ESRD on hemodialysis ([place])   . Anemia 09/16/2017  . Diarrhea 09/16/2017  . Tarry stools 09/16/2017  . Acute encephalopathy   . Hypotension   . Complication of arteriovenous dialysis fistula   . Stage 5 chronic kidney disease on chronic dialysis ([place])   . Infected defibrillator ([place])   . MSSA bacteremia 08/18/2017  . Sepsis ([place]) 08/17/2017  . Lymphadenopathy, mediastinal 04/10/2017  . Hypothyroidism 10/09/2016  . Acute respiratory failure with hypoxia ([place]) 08/06/2016  . HCAP (healthcare-associated pneumonia) 08/05/2016  . Thyroid cancer ([place]) 06/12/2016  . Epistaxis 03/04/2016  . Long term current use of amiodarone 01/10/2016  . Critical lower limb ischemia 12/04/2015  . Carotid artery stenosis 05/24/2015  . Excess skin 05/24/2015  . 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 Status post amputation 04/19/2015  . Wound  dehiscence 04/19/2015  . Sinus congestion 03/08/2015  . Bleeding pseudoaneurysm of left brachiocephalic arteriovenous fistula ([place]) 12/24/2014  . Pure hypercholesterolemia 11/09/2014  . Obesity 11/07/2014  . Healthcare maintenance 11/07/2014  . H/O malignant neoplasm of thyroid 11/07/2014  . Toe amputation status 10/13/2014  . ICD (implantable cardioverter-defibrillator) in place 07/25/2014  . ESRD (end stage renal disease) on hemodialysis 07/25/2014  . End-stage renal disease ([place]) 07/25/2014  . Automatic implantable cardioverter-defibrillator in situ 07/25/2014  . Pulmonary hypertension ([place]) 09/05/2014  . Secondary pulmonary hypertension 09/05/2014  . Atrial fibrillation [I48.91] 05/24/2014  . Long term current use of anticoagulant therapy 05/24/2014    Past Medical History: Past Medical History:  Diagnosis Date  . AICD (automatic cardioverter/defibrillator) present    [employer] - s/p extraction 08/21/17 due to staph infection  . Anemia   . Aortic stenosis    moderate AS by 08/2016 echo  . Arthritis    "knees" (08/05/2016)  . Atrial fibrillation ([place])   . ESRD (end stage renal disease) on dialysis ([place])    "M, T, W, T, F; NX stage hemodialysis; I do it at home" (08/05/2016)  . Great toe amputation status    status post  left hallux amputation 08/01/14  . History of blood transfusion 2009   "S/P biopsy for prostate cancer check"  . Hypothyroidism (acquired)   . Mitral stenosis    moderate mitral stenosis  . Nonhealing surgical wound    left thigh  . Nonischemic cardiomyopathy ([place])   . Pneumonia 2014; 08/05/2016  . Pulmonary HTN ([place])   . Renal insufficiency   . Thyroid cancer ([place]) 2011    Past Surgical History: Past Surgical History:  Procedure Laterality Date  . ARTERIAL LINE INSERTION Right 08/24/2017   Procedure: ARTERIAL LINE INSERTION INTO RIGHT FEMORAL ARTERY;  Surgeon: Conrad [place], MD;  Location: [place];  Service: Vascular;  Laterality: Right;  . BASCILIC VEIN  TRANSPOSITION Right 10/13/2017   Procedure: FIRST STAGE BRACHIAL VEIN TRANSPOSITION RIGHT ARM;  Surgeon: Conrad Marquand, MD;  Location: [place];  Service: Vascular;  Laterality: Right;  . BIOPSY  09/25/2017   Procedure: BIOPSY;  Surgeon: Ronnette Juniper, MD;  Location[facility]COPY;  Service: Gastroenterology;;  . CARDIAC CATHETERIZATION Right 2017  . CARDIAC DEFIBRILLATOR PLACEMENT  2009  . DIALYSIS FISTULA CREATION Left 2003   "upper arm"  . DIALYSIS/PERMA CATHETER REMOVAL Left 09/01/2017   Procedure: DIALYSIS/PERMA CATHETER REMOVAL;  Surgeon: Conrad [place], MD;  Location: [place];  Service: Vascular;  Laterality: Left;  . ESOPHAGOGASTRODUODENOSCOPY (EGD) WITH PROPOFOL N/A 09/25/2017   Procedure: ESOPHAGOGASTRODUODENOSCOPY (EGD) WITH PROPOFOL;  Surgeon: Ronnette Juniper, MD;  Location: [place];  Service: Gastroenterology;  Laterality: N/A;  . HERNIA REPAIR    . I&D EXTREMITY Right 11/06/2017   Procedure: IRRIGATION AND DEBRIDEMENT KNEE;  Surgeon: Rod Can, MD;  Location: [place];  Service: Orthopedics;  Laterality: Right;  . I&D EXTREMITY Left 01/03/2018   Procedure: IRRIGATION AND DEBRIDEMENT OF THIGH, wound vac application  wound size 10cm x 2.5cm x 2.5cm;  Surgeon: Waynetta Sandy, MD;  Location: [place];  Service: Vascular;  Laterality: Left;  . ICD LEAD REMOVAL Right 08/20/2017   Procedure: ICD EXTRACTION WITH C-ARM;  Surgeon: Evans Lance, MD;  Location: [facility] OR;  Service: Cardiovascular;  Laterality: Right;  DR VAN TRIGT TO BACK-UP  . icd removed    . INSERTION OF DIALYSIS CATHETER N/A 11/11/2016   Procedure: INSERTION OF TUNNELED DIALYSIS CATHETER;  Surgeon: Angelia Mould, MD;  Location: [place];  Service: Vascular;  Laterality: N/A;  . INSERTION OF DIALYSIS CATHETER Left 08/24/2017   Procedure: INSERTION OF DIALYSIS CATHETER;  Surgeon: Conrad Hastings, MD;  Location: [place];  Service: Vascular;  Laterality: Left;  . INSERTION OF DIALYSIS CATHETER Left 09/01/2017   Procedure:  INSERTION OF DIALYSIS CATHETER;  Surgeon: Conrad , MD;  Location: [place];  Service: Vascular;  Laterality: Left;  . KIDNEY TRANSPLANT  2009  . KNEE SURGERY Bilateral 2000s   "drained fluid"  . LAPAROSCOPIC GASTRIC BANDING  2006  . LIGATION OF ARTERIOVENOUS  FISTULA Right 11/10/2017   Procedure: LIGATION OF BRACHIAL VEIN TRANSPOSITION RIGHT ARM;  Surgeon: Angelia Mould, MD;  Location: [place];  Service: Vascular;  Laterality: Right;  . MASS EXCISION N/A 01/12/2018   Procedure: EXCISION OF MULTIPLE SUBCUTANEOUS MASSES;  Surgeon: Ralene Ok, MD;  Location: [place];  Service: General;  Laterality: N/A;  . PROSTATE BIOPSY  2009  . REVISON OF ARTERIOVENOUS FISTULA Left 0/17/5102   Procedure: PLICATION OF ARTERIOVENOUS FISTULA ANEURYSM;  Surgeon: Elam Dutch, MD;  Location: [place];  Service: Vascular;  Laterality: Left;  . REVISON OF ARTERIOVENOUS FISTULA Left  11/10/2016   Procedure: REVISON OF ARTERIOVENOUS FISTULA - LEFT UPPER ARM;  Surgeon: Elam Dutch, MD;  Location: [place];  Service: Vascular;  Laterality: Left;  . REVISON OF ARTERIOVENOUS FISTULA Left 08/24/2017   Procedure: REVISON OF ARTERIOVENOUS FISTULA LEFT;  Surgeon: Conrad Bagley, MD;  Location: [place];  Service: Vascular;  Laterality: Left;  . TEE WITHOUT CARDIOVERSION N/A 08/20/2017   Procedure: TRANSESOPHAGEAL ECHOCARDIOGRAM (TEE);  Surgeon: Evans Lance, MD;  Location: [place];  Service: Cardiovascular;  Laterality: N/A;  . THROMBECTOMY W/ EMBOLECTOMY Left 12/25/2014   Procedure: THROMBECTOMY ARTERIOVENOUS FISTULA;  Surgeon: Elam Dutch, MD;  Location: [place];  Service: Vascular;  Laterality: Left;  . THYROIDECTOMY  2011  . TOE AMPUTATION Bilateral     right 1st and  2nd digits; left 1st and 3rd digits"  . UMBILICAL HERNIA REPAIR  X 2  . UPPER EXTREMITY VENOGRAPHY Right 11/05/2017   Procedure: UPPER EXTREMITY VENOGRAPHY;  Surgeon: Conrad [place], MD;  Location: [place] CV LAB;  Service: Cardiovascular;   Laterality: Right;  Marland Kitchen VASCULAR ACCESS DEVICE INSERTION Left 11/10/2017   Procedure: INSERTION OF SUPER HERO VASCULAR ACCESS DEVICE LEFT ARM;  Surgeon: Angelia Mould, MD;  Location: [place];  Service: Vascular;  Laterality: Left;  Marland Kitchen VEIN HARVEST Left 08/24/2017   Procedure: VEIN HARVEST;  Surgeon: Conrad Kingston, MD;  Location: [place];  Service: Vascular;  Laterality: Left;  . WOUND DEBRIDEMENT Left 12/22/2017   Procedure: EXCISION OF LEFT THIGH WOUND;  Surgeon: Elam Dutch, MD;  Location: [place];  Service: Vascular;  Laterality: Left;  . WOUND DEBRIDEMENT Left 01/28/2018   Procedure: DEBRIDEMENT WOUND LEFT THIGH;  Surgeon: Marty Heck, MD;  Location: [facility] OR;  Service: Vascular;  Laterality: Left;    Social History: Social History   Tobacco Use  . Smoking status: Never Smoker  . Smokeless tobacco: Never Used  Substance Use Topics  . Alcohol use: Yes    Alcohol/week: 0.0 standard drinks    Comment: social  . Drug use: No   Additional social history: lives with wife, who was present today  Please also refer to relevant sections of EMR.  Family History: Family History  Problem Relation Age of Onset  . Heart failure Mother   . Hypertension Mother   . CAD Mother 8  . Emphysema Mother   . Hypertension Father   . Kidney failure Father     Allergies and Medications: Allergies  Allergen Reactions  . Enalapril Swelling and Other (See Comments)    MOUTH SWELLING EDEMA  . Iodinated Diagnostic Agents Nausea And Vomiting and Nausea Only   No current facility-administered medications on file prior to encounter.    Current Outpatient Medications on File Prior to Encounter  Medication Sig Dispense Refill  . albuterol (PROVENTIL) (2.5 MG/3ML) 0.083% nebulizer solution Take 3 mLs (2.5 mg total) by nebulization every 6 (six) hours as needed for wheezing or shortness of breath. 360 mL 5  . ambrisentan (LETAIRIS) 10 MG tablet Take 1 tablet (10 mg total) by mouth daily. 30  tablet 11  . amiodarone (PACERONE) 200 MG tablet Take 200 mg by mouth daily.     Marland Kitchen apixaban (ELIQUIS) 2.5 MG TABS tablet Take 1 tablet (2.5 mg total) by mouth 2 (two) times daily. 60 tablet 0  . atorvastatin (LIPITOR) 40 MG tablet Take 1 tablet (40 mg total) by mouth every evening. 30 tablet 2  . cinacalcet (SENSIPAR) 90 MG tablet Take 90  mg by mouth every evening.    Marland Kitchen levothyroxine (SYNTHROID, LEVOTHROID) 300 MCG tablet Take 1 tablet (300 mcg total) by mouth daily before breakfast. 90 tablet 3  . midodrine (PROAMATINE) 10 MG tablet TAKE 1 TABLET (10 MG TOTAL) BY MOUTH 3 (THREE) TIMES DAILY. 90 tablet 5  . multivitamin (RENA-VIT) TABS tablet TAKE 1 TABLET BY MOUTH EVERYDAY AT BEDTIME (Patient taking differently: Take 1 tablet by mouth at bedtime. ) 30 tablet 0  . nystatin (MYCOSTATIN/NYSTOP) powder Apply topically 4 (four) times daily. (Patient taking differently: Apply 1 g topically daily. ) 60 g 0  . tadalafil (ADCIRCA/CIALIS) 20 MG tablet Take 20 mg by mouth daily.     . traMADol (ULTRAM) 50 MG tablet Take 1 tablet (50 mg total) by mouth every 6 (six) hours as needed. (Patient taking differently: Take 50 mg by mouth every 6 (six) hours as needed for moderate pain. ) 20 tablet 0  . diphenhydrAMINE (BENADRYL) 50 MG tablet Take 1 tablet by mouth 1 hour prior to study (Patient not taking: Reported on 02/17/2018) 1 tablet 0  . predniSONE (DELTASONE) 20 MG tablet Take 2 and 1/2 tablets (50mg ) by mouth 13 hours, 7 hours, and 1 hour prior to study. (Patient not taking: Reported on 02/17/2018) 8 tablet 0    Objective: BP (!) 82/57   Pulse 64   Temp (!) 97.5 F (36.4 C) (Oral)   Resp (!) 21   Ht 6\' 1"  (1.854 m)   Wt 122.5 kg   SpO2 98%   BMI 35.62 kg/m  Exam: General: obese male lying flat in bed in NAD.  Eyes: arcus senilis bilaterally, normal EOM ENTM: MMM, poor dentition Neck: supple Cardiovascular: RRR, 4/6 blowing systolic murmur Respiratory: CTAB, easy WOB Gastrointestinal: SNTND,  BS+ Skin: groin wound nontender and without discharge, granulation tissue present. Two surgical sites covered (R flank and L posterior shoulder).  Neuro: CN II-XII grossly intact.  Psych: mood and affect appropriate  Labs and Imaging: CBC BMET  Recent Labs  Lab 02/18/18 0728 02/18/18 0838  WBC 8.2  --   HGB 8.5* 10.5*  HCT 27.5* 31.0*  PLT 192  --    Recent Labs  Lab 02/18/18 0728 02/18/18 0838  NA 137 138  K 2.9* 2.7*  CL 94* 98  CO2 30  --   BUN 16 17  CREATININE 4.35* 4.10*  GLUCOSE 75 67*  CALCIUM 9.4  --      Dg Chest Port 1 View  Result Date: 02/18/2018 CLINICAL DATA:  Fever, chills, shortness of breath, open leg wound, history atrial fibrillation, thyroid cancer, end-stage renal disease on dialysis, non ischemic cardiomyopathy, pulmonary hypertension EXAM: PORTABLE CHEST 1 VIEW COMPARISON:  Portable exam 0714 hours compared to 11/10/2017 FINDINGS: Large bore LEFT jugular dialysis catheter with tip projecting over RIGHT atrium. Enlargement of cardiac silhouette with pulmonary vascular congestion. Minimal accentuation of perihilar interstitial markings likely reflects mild chronic failure. No acute infiltrates, pleural effusion or pneumothorax. Vascular stents identified at the LEFT axilla into LEFT upper extremity. Atherosclerotic calcification aorta. IMPRESSION: Enlargement of cardiac silhouette with pulmonary vascular congestion and minimal chronic failure. No new infiltrates identified. Electronically Signed   By: Lavonia Dana M.D.   On: 02/18/2018 07:49     Sela Hilding, MD 02/18/2018, 11:07 AM PGY-3, [facility] pager: [phone], text pages welcome

## 2018-02-18 NOTE — Procedures (Signed)
   I was present at this dialysis session, have reviewed the session itself and made  appropriate changes Kelly Splinter MD [place] pager [phone]   02/18/2018, 2:16 PM

## 2018-02-18 NOTE — Progress Notes (Signed)
FPTS Interim Progress Note  S:Paged by nurse as patient bleeding from L groin wound.  Patient seen and examined at bedside.  Rapid response nurse present at the time of arrival holding pressure on wound with patient supine in bed.  Patient notes that bleeding started when he was sitting at bedside commode.  Patient otherwise feeling well.  O: BP (!) 90/58 (BP Location: Right Arm)   Pulse 72   Temp 97.8 F (36.6 C)   Resp 18   Ht 6\' 1"  (1.854 m)   Wt 122.5 kg   SpO2 95%   BMI 35.62 kg/m   Physical Exam: General: 57 y.o. male  Lying supine Cardio: non-tachycardic  Extremities: pressure being applied to left groin wound, when pressure removed, only mild oozing noted at skin margin, following more pressure, no oozing noted at all    A/P: Vascular surgery paged, noted that previously had only been a superficial bleed and was not arterial in nature, believe squirting to be positional and recommend paging if starts to squirt blood while supine, also recommend continued pressure BP at patient's baseline and he is stable and asymptomatic Wedge dressing for pressure Patient to remain in supine position with head elevated to only 45 degrees Will order BP checks q1hr Have nursing check bandage, without disturbing, for bleedthrough  Shaul Trautman, Bernita Raisin, DO 02/18/2018, 10:03 PM PGY-1, [facility] pager [phone]

## 2018-02-18 NOTE — Consult Note (Signed)
Hospital Consult    Reason for Consult:  Left groin wound Referring Physician:  ED MRN #:  [id number]  History of Present Illness: This is a 57 y.o. male with history of AICD, A. fib on Eliquis, end-stage renal disease on hemodialysis, diabetes that presents to the ED with bleeding from a left groin wound.  Patient previously had a tunneled dialysis catheter in left groin.  Ultimately he underwent excision of a draining sinus tract in the left thigh secondary to a tunnel catheter by Dr. [name].  This wound ultimately showed failure to heal and he required several trips to the OR for sharp excisional debridement.  His last trip to the OR was on 01/28/2018 when the wound measured 10 x 3 x 1 cm.  He presents today and states he had bleeding early this morning from the edge of the wound.  Patient is on Eliquis as a blood thinner.  At the time of our evaluation after holding manual pressure patient states that the bleeding stopped.  He denies any fevers or chills at home.  His other concern is drainage from a right flank wound where he had a subcutaneous mass excised.  Past Medical History:  Diagnosis Date  . AICD (automatic cardioverter/defibrillator) present    [employer] - s/p extraction 08/21/17 due to staph infection  . Anemia   . Aortic stenosis    moderate AS by 08/2016 echo  . Arthritis    "knees" (08/05/2016)  . Atrial fibrillation ([place])   . ESRD (end stage renal disease) on dialysis ([place])    "M, T, W, T, F; NX stage hemodialysis; I do it at home" (08/05/2016)  . 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 History of blood transfusion 2009   "S/P biopsy for prostate cancer check"  . Hypothyroidism (acquired)   . Mitral stenosis    moderate mitral stenosis  . Nonhealing surgical wound    left thigh  . Nonischemic cardiomyopathy ([place])   . Pneumonia 2014; 08/05/2016  . Pulmonary HTN ([place])   . Renal insufficiency   . Thyroid cancer ([place]) 2011     Past Surgical History:  Procedure Laterality Date  . ARTERIAL LINE INSERTION Right 08/24/2017   Procedure: ARTERIAL LINE INSERTION INTO RIGHT FEMORAL ARTERY;  Surgeon: Conrad Pembroke Pines, MD;  Location: [place];  Service: Vascular;  Laterality: Right;  . BASCILIC VEIN TRANSPOSITION Right 10/13/2017   Procedure: FIRST STAGE BRACHIAL VEIN TRANSPOSITION RIGHT ARM;  Surgeon: Conrad [place] Camp, MD;  Location: [place];  Service: Vascular;  Laterality: Right;  . BIOPSY  09/25/2017   Procedure: BIOPSY;  Surgeon: Ronnette Juniper, MD;  Location: [facility] ENDOSCOPY;  Service: Gastroenterology;;  . CARDIAC CATHETERIZATION Right 2017  . CARDIAC DEFIBRILLATOR PLACEMENT  2009  . DIALYSIS FISTULA CREATION Left 2003   "upper arm"  . DIALYSIS/PERMA CATHETER REMOVAL Left 09/01/2017   Procedure: DIALYSIS/PERMA CATHETER REMOVAL;  Surgeon: Conrad Glenwood, MD;  Location: [place];  Service: Vascular;  Laterality: Left;  . ESOPHAGOGASTRODUODENOSCOPY (EGD) WITH PROPOFOL N/A 09/25/2017   Procedure: ESOPHAGOGASTRODUODENOSCOPY (EGD) WITH PROPOFOL;  Surgeon: Ronnette Juniper, MD;  Location: [place];  Service: Gastroenterology;  Laterality: N/A;  . HERNIA REPAIR    . I&D EXTREMITY Right 11/06/2017   Procedure: IRRIGATION AND DEBRIDEMENT KNEE;  Surgeon: Rod Can, MD;  Location: [place];  Service: Orthopedics;  Laterality: Right;  . I&D EXTREMITY Left 01/03/2018   Procedure: IRRIGATION AND DEBRIDEMENT OF THIGH, wound vac application  wound  size 10cm x 2.5cm x 2.5cm;  Surgeon: Waynetta Sandy, MD;  Location: [place];  Service: Vascular;  Laterality: Left;  . ICD LEAD REMOVAL Right 08/20/2017   Procedure: ICD EXTRACTION WITH C-ARM;  Surgeon: Evans Lance, MD;  Location[facility] OR;  Service: Cardiovascular;  Laterality: Right;  DR VAN TRIGT TO BACK-UP  . icd removed    . INSERTION OF DIALYSIS CATHETER N/A 11/11/2016   Procedure: INSERTION OF TUNNELED DIALYSIS CATHETER;  Surgeon: Angelia Mould, MD;  Location: [place];  Service: Vascular;   Laterality: N/A;  . INSERTION OF DIALYSIS CATHETER Left 08/24/2017   Procedure: INSERTION OF DIALYSIS CATHETER;  Surgeon: Conrad [place], MD;  Location: [place];  Service: Vascular;  Laterality: Left;  . INSERTION OF DIALYSIS CATHETER Left 09/01/2017   Procedure: INSERTION OF DIALYSIS CATHETER;  Surgeon: Conrad [place], MD;  Location: [place];  Service: Vascular;  Laterality: Left;  . KIDNEY TRANSPLANT  2009  . KNEE SURGERY Bilateral 2000s   "drained fluid"  . LAPAROSCOPIC GASTRIC BANDING  2006  . LIGATION OF ARTERIOVENOUS  FISTULA Right 11/10/2017   Procedure: LIGATION OF BRACHIAL VEIN TRANSPOSITION RIGHT ARM;  Surgeon: Angelia Mould, MD;  Location: [place];  Service: Vascular;  Laterality: Right;  . MASS EXCISION N/A 01/12/2018   Procedure: EXCISION OF MULTIPLE SUBCUTANEOUS MASSES;  Surgeon: Ralene Ok, MD;  Location: [place];  Service: General;  Laterality: N/A;  . PROSTATE BIOPSY  2009  . REVISON OF ARTERIOVENOUS FISTULA Left 08/23/2438   Procedure: PLICATION OF ARTERIOVENOUS FISTULA ANEURYSM;  Surgeon: Elam Dutch, MD;  Location: [place];  Service: Vascular;  Laterality: Left;  . REVISON OF ARTERIOVENOUS FISTULA Left 11/10/2016   Procedure: REVISON OF ARTERIOVENOUS FISTULA - LEFT UPPER ARM;  Surgeon: Elam Dutch, MD;  Location: [place];  Service: Vascular;  Laterality: Left;  . REVISON OF ARTERIOVENOUS FISTULA Left 08/24/2017   Procedure: REVISON OF ARTERIOVENOUS FISTULA LEFT;  Surgeon: Conrad Hayden Lake, MD;  Location: [place];  Service: Vascular;  Laterality: Left;  . TEE WITHOUT CARDIOVERSION N/A 08/20/2017   Procedure: TRANSESOPHAGEAL ECHOCARDIOGRAM (TEE);  Surgeon: Evans Lance, MD;  Location: [place];  Service: Cardiovascular;  Laterality: N/A;  . THROMBECTOMY W/ EMBOLECTOMY Left 12/25/2014   Procedure: THROMBECTOMY ARTERIOVENOUS FISTULA;  Surgeon: Elam Dutch, MD;  Location: [place];  Service: Vascular;  Laterality: Left;  . THYROIDECTOMY  2011  . TOE AMPUTATION Bilateral      right 1st and  2nd digits; left 1st and 3rd digits"  . UMBILICAL HERNIA REPAIR  X 2  . UPPER EXTREMITY VENOGRAPHY Right 11/05/2017   Procedure: UPPER EXTREMITY VENOGRAPHY;  Surgeon: Conrad Jauca, MD;  Location: [place] [facility];  Service: Cardiovascular;  Laterality: Right;  Marland Kitchen VASCULAR ACCESS DEVICE INSERTION Left 11/10/2017   Procedure: INSERTION OF SUPER HERO VASCULAR ACCESS DEVICE LEFT ARM;  Surgeon: Angelia Mould, MD;  Location: [place];  Service: Vascular;  Laterality: Left;  Marland Kitchen VEIN HARVEST Left 08/24/2017   Procedure: VEIN HARVEST;  Surgeon: Conrad Roslyn, MD;  Location: [place];  Service: Vascular;  Laterality: Left;  . WOUND DEBRIDEMENT Left 12/22/2017   Procedure: EXCISION OF LEFT THIGH WOUND;  Surgeon: Elam Dutch, MD;  Location: [place];  Service: Vascular;  Laterality: Left;  . WOUND DEBRIDEMENT Left 01/28/2018   Procedure: DEBRIDEMENT WOUND LEFT THIGH;  Surgeon: Marty Heck, MD;  Location: [place];  Service: Vascular;  Laterality: Left;    Allergies  Allergen Reactions  . Enalapril Swelling and Other (See Comments)    MOUTH SWELLING EDEMA  . Iodinated Diagnostic Agents Nausea And Vomiting and Nausea Only    Prior to Admission medications   Medication Sig Start Date End Date Taking? Authorizing Provider  albuterol (PROVENTIL) (2.5 MG/3ML) 0.083% nebulizer solution Take 3 mLs (2.5 mg total) by nebulization every 6 (six) hours as needed for wheezing or shortness of breath. 01/27/18  Yes Juanito Doom, MD  ambrisentan (LETAIRIS) 10 MG tablet Take 1 tablet (10 mg total) by mouth daily. 09/11/17  Yes Juanito Doom, MD  amiodarone (PACERONE) 200 MG tablet Take 200 mg by mouth daily.    Yes [provider]  apixaban (ELIQUIS) 2.5 MG TABS tablet Take 1 tablet (2.5 mg total) by mouth 2 (two) times daily. 01/29/18  Yes Ulyses Amor, PA-C  atorvastatin (LIPITOR) 40 MG tablet Take 1 tablet (40 mg total) by mouth every evening. 12/11/17  Yes Diallo, Abdoulaye,  MD  cinacalcet (SENSIPAR) 90 MG tablet Take 90 mg by mouth every evening.   Yes [provider]  levothyroxine (SYNTHROID, LEVOTHROID) 300 MCG tablet Take 1 tablet (300 mcg total) by mouth daily before breakfast. 09/26/17  Yes Enid Derry, [place], DO  midodrine (PROAMATINE) 10 MG tablet TAKE 1 TABLET (10 MG TOTAL) BY MOUTH 3 (THREE) TIMES DAILY. 10/08/17  Yes Minus Breeding, MD  multivitamin (RENA-VIT) TABS tablet TAKE 1 TABLET BY MOUTH EVERYDAY AT BEDTIME Patient taking differently: Take 1 tablet by mouth at bedtime.  01/12/18  Yes Diallo, Abdoulaye, MD  nystatin (MYCOSTATIN/NYSTOP) powder Apply topically 4 (four) times daily. Patient taking differently: Apply 1 g topically daily.  11/18/17  Yes Diallo, Abdoulaye, MD  tadalafil (ADCIRCA/CIALIS) 20 MG tablet Take 20 mg by mouth daily.  10/06/17  Yes [provider]  traMADol (ULTRAM) 50 MG tablet Take 1 tablet (50 mg total) by mouth every 6 (six) hours as needed. Patient taking differently: Take 50 mg by mouth every 6 (six) hours as needed for moderate pain.  01/12/18 01/12/19 Yes Ralene Ok, MD  diphenhydrAMINE (BENADRYL) 50 MG tablet Take 1 tablet by mouth 1 hour prior to study Patient not taking: Reported on 02/17/2018 02/12/18   Martyn Ehrich, NP  predniSONE (DELTASONE) 20 MG tablet Take 2 and 1/2 tablets (50mg ) by mouth 13 hours, 7 hours, and 1 hour prior to study. Patient not taking: Reported on 02/17/2018 02/12/18   Martyn Ehrich, NP    Social History   Socioeconomic History  . Marital status: Married    Spouse name: Not on file  . Number of children: 1  . Years of education: Not on file  . Highest education level: Not on file  Occupational History  . Occupation: Retired Designer, industrial/product from [employer]  . Financial resource strain: Not on file  . Food insecurity:    Worry: Not on file    Inability: Not on file  . Transportation needs:    Medical: Not on file    Non-medical: Not on file  Tobacco  Use  . Smoking status: Never Smoker  . Smokeless tobacco: Never Used  Substance and Sexual Activity  . Alcohol use: Yes    Alcohol/week: 0.0 standard drinks    Comment: social  . Drug use: No  . Sexual activity: Yes  Lifestyle  . Physical activity:    Days per week: Not on file    Minutes per session: Not on file  . Stress: Not on  file  Relationships  . Social connections:    Talks on phone: Not on file    Gets together: Not on file    Attends religious service: Not on file    Active member of club or organization: Not on file    Attends meetings of clubs or organizations: Not on file    Relationship status: Not on file  . Intimate partner violence:    Fear of current or ex partner: Not on file    Emotionally abused: Not on file    Physically abused: Not on file    Forced sexual activity: Not on file  Other Topics Concern  . Not on file  Social History Narrative   Retired Archivist.       Family History  Problem Relation Age of Onset  . Heart failure Mother   . Hypertension Mother   . CAD Mother 14  . Emphysema Mother   . Hypertension Father   . Kidney failure Father     ROS: [x]  Positive   [ ]  Negative   [ ]  All sytems reviewed and are negative  Cardiovascular: []  chest pain/pressure []  palpitations []  SOB lying flat []  DOE []  pain in legs while walking []  pain in legs at rest []  pain in legs at night []  non-healing ulcers []  hx of DVT []  swelling in legs  Pulmonary: []  productive cough []  asthma/wheezing []  home O2  Neurologic: []  weakness in []  arms []  legs []  numbness in []  arms []  legs []  hx of CVA []  mini stroke [] difficulty speaking or slurred speech []  temporary loss of vision in one eye []  dizziness  Hematologic: []  hx of cancer []  bleeding problems []  problems with blood clotting easily  Endocrine:   []  diabetes []  thyroid disease  GI []  vomiting blood []  blood in stool  GU: []  CKD/renal failure []  HD--[]   M/W/F or []  T/T/S []  burning with urination []  blood in urine  Psychiatric: []  anxiety []  depression  Musculoskeletal: []  arthritis []  joint pain  Integumentary: []  rashes []  ulcers X wound left groin  Constitutional: []  fever []  chills   Physical Examination  Vitals:   02/18/18 0830 02/18/18 0845  BP: 92/67 (!) 77/57  Pulse: 69 68  Resp: 11 11  Temp:    SpO2: 98% 99%   Body mass index is 35.62 kg/m.  General:  NAD Gait: Not observed HENT: WNL, normocephalic Pulmonary: normal non-labored breathing Abdomen: obese, soft, NT/ND, no masses Skin: shallow 8x2 cm wound pictured below, granulation tissue evident, some fibrinous exudate particularly along wound edge Vascular Exam/Pulses:  Right Left  Radial    Ulnar    Femoral 2+ (normal) 2+ (normal)  Popliteal    DP 2+ (normal) 2+ (normal)  PT     Extremities: without ischemic changes, without Gangrene , without cellulitis; without open wounds;  Musculoskeletal: no muscle wasting or atrophy  Neurologic: A&O X 3; Appropriate Affect ; SENSATION: normal; MOTOR FUNCTION:  moving all extremities equally. Speech is fluent/normal      CBC    Component Value Date/Time   WBC 8.2 02/18/2018 0728   RBC 3.07 (L) 02/18/2018 0728   HGB 10.5 (L) 02/18/2018 0838   HGB 8.8 (L) 02/10/2018 1556   HCT 31.0 (L) 02/18/2018 0838   HCT 28.2 (L) 02/10/2018 1556   PLT 192 02/18/2018 0728   PLT 122 (L) 02/10/2018 1556   MCV 89.6 02/18/2018 0728   MCV 87 02/10/2018 1556   MCH 27.7 02/18/2018  0728   MCHC 30.9 02/18/2018 0728   RDW 22.0 (H) 02/18/2018 0728   RDW 17.7 (H) 02/10/2018 1556   LYMPHSABS 0.5 (L) 02/18/2018 0728   LYMPHSABS 0.5 (L) 02/10/2018 1556   MONOABS 0.5 02/18/2018 0728   EOSABS 0.0 02/18/2018 0728   EOSABS 0.1 02/10/2018 1556   BASOSABS 0.0 02/18/2018 0728   BASOSABS 0.0 02/10/2018 1556    BMET    Component Value Date/Time   NA 138 02/18/2018 0838   K 2.7 (LL) 02/18/2018 0838   CL 98 02/18/2018 0838     CO2 30 02/18/2018 0728   GLUCOSE 67 (L) 02/18/2018 0838   BUN 17 02/18/2018 0838   CREATININE 4.10 (H) 02/18/2018 0838   CREATININE 4.91 (H) 08/13/2015 1223   CALCIUM 9.4 02/18/2018 0728   GFRNONAA 14 (L) 02/18/2018 0728   GFRNONAA 13 (L) 07/27/2014 1539   GFRAA 16 (L) 02/18/2018 0728   GFRAA 15 (L) 07/27/2014 1539    COAGS: Lab Results  Component Value Date   INR 1.21 01/12/2018   INR 1.32 12/22/2017   INR 1.29 12/12/2017     ASSESSMENT/PLAN: This is a 57 y.o. male that presents with bleeding from a nonhealing left groin wound following previous excision of a nonhealing sinus tract from tunneled catheter.  On my evaluation in the ED he has no active bleeding after manual pressure was held by EMS.  In talking with the patient and his family appears that the bleeding was from the wound edge.  Overall I think the wound is showing signs of healing and is granulating accordingly - much shallower since last evaluation.  The granulation tissue is fragile and he is on blood thinners that likely contributed to bleeding episode.  He has been doing wet-to-dry with [facility] given concern for some Pseudomonas before.  I think at this time we should switch him to [facility] for some enzymatic debridement.  Surgery will continue to follow.  No plans for open debridement at this time for left groin wound.     Marty Heck, MD [facility] Office: [phone] Pager: [place]

## 2018-02-18 NOTE — ED Triage Notes (Signed)
Patient from home and had an old dialysis access point in left femoral open up. Per patient "blood was spurting everywhere". Per EMS approx 250cc blood loss. Pressure applied and bleeding stopped. Would site is approx 5" x 2"

## 2018-02-18 NOTE — Progress Notes (Signed)
Pharmacy Antibiotic Note  Adam [name]. is a 57 y.o. male admitted on 02/18/2018 with sepsis.  Pharmacy has been consulted for vancomycin dosing. Pt is afebrile and WBC is WNL. Lactic acid is normal.   Plan: Vancomycin 1gm post-HD F/u renal plans, C&S, clinical status and pre-HD vanc level when appropriate  Height: 6\' 1"  (185.4 cm) Weight: 270 lb (122.5 kg) IBW/kg (Calculated) : 79.9  Temp (24hrs), Avg:97.5 F (36.4 C), Min:97.5 F (36.4 C), Max:97.5 F (36.4 C)  Recent Labs  Lab 02/18/18 0728 02/18/18 0838 02/18/18 0935  WBC 8.2  --   --   CREATININE 4.35* 4.10*  --   LATICACIDVEN  --  1.93* 1.66    Estimated Creatinine Clearance: 27.9 mL/min (A) (by C-G formula based on SCr of 4.1 mg/dL (H)).    Allergies  Allergen Reactions  . Enalapril Swelling and Other (See Comments)    MOUTH SWELLING EDEMA  . Iodinated Diagnostic Agents Nausea And Vomiting and Nausea Only    Antimicrobials this admission: Vanc 10/10>> CTX 10/10>>  Dose adjustments this admission: N/A  Microbiology results: Pending  Thank you for allowing pharmacy to be a part of this patient's care.  Eliana Lueth, Rande Lawman 02/18/2018 11:43 AM

## 2018-02-18 NOTE — Consult Note (Addendum)
Renal Service Consult Note [facility]  Matt Delpizzo. 02/18/2018 Sol Blazing Requesting Physician:  Dr Andria Frames  Reason for Consult:  ESRD pt w/ bleeding thigh wound HPI: The patient is a 57 y.o. year-old w/ hx of HTN, failed renal Tx, NICM, atrial fib, ICD implant and ESRD on HD MWF, currently in -center.    Had HD yesterday, came off under dry wt yesterday.  Losing weight, appetite ok but doesn't eat a lot.  SOB+ a couple of weeks ago, no change w/ HD.  Saw pulm MD recently who said he had fluid around his heart, and was scheduled for echo today but didn't make it.  No pain in abd, +chills x 2 wks off and on, no fevers.    ESRD due to HTN started HD around 2003, had renal Tx 2009 for 18 mos.    Pertinent findings in the ED include hypotension, K 2.7, CXR showing vascular congestion and CT completed prior to admission on 10/8 showing interstitial pulmonary edema w diffuse ground glass and pericardial effusion.  Patient has been admitted for further evaluation.    ROS  denies CP  no joint pain   no HA  no blurry vision  no rash  no diarrhea  no nausea/ vomiting   Past Medical History  Past Medical History:  Diagnosis Date  . AICD (automatic cardioverter/defibrillator) present    [employer] - s/p extraction 08/21/17 due to staph infection  . Anemia   . Aortic stenosis    moderate AS by 08/2016 echo  . Arthritis    "knees" (08/05/2016)  . Atrial fibrillation ([place])   . ESRD (end stage renal disease) on dialysis ([place])    "M, T, W, T, F; NX stage hemodialysis; I do it at home" (08/05/2016)  . 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 History of blood transfusion 2009   "S/P biopsy for prostate cancer check"  . Hypothyroidism (acquired)   . Mitral stenosis    moderate mitral stenosis  . Nonhealing surgical wound    left thigh  . Nonischemic cardiomyopathy ([place])   . Pneumonia 2014; 08/05/2016  . Pulmonary HTN ([place])    . Renal insufficiency   . Thyroid cancer ([place]) 2011   Past Surgical History  Past Surgical History:  Procedure Laterality Date  . ARTERIAL LINE INSERTION Right 08/24/2017   Procedure: ARTERIAL LINE INSERTION INTO RIGHT FEMORAL ARTERY;  Surgeon: Conrad Riceville, MD;  Location: [place];  Service: Vascular;  Laterality: Right;  . BASCILIC VEIN TRANSPOSITION Right 10/13/2017   Procedure: FIRST STAGE BRACHIAL VEIN TRANSPOSITION RIGHT ARM;  Surgeon: Conrad Parkdale, MD;  Location: [place];  Service: Vascular;  Laterality: Right;  . BIOPSY  09/25/2017   Procedure: BIOPSY;  Surgeon: Ronnette Juniper, MD;  Location: [facility] ENDOSCOPY;  Service: Gastroenterology;;  . CARDIAC CATHETERIZATION Right 2017  . CARDIAC DEFIBRILLATOR PLACEMENT  2009  . DIALYSIS FISTULA CREATION Left 2003   "upper arm"  . DIALYSIS/PERMA CATHETER REMOVAL Left 09/01/2017   Procedure: DIALYSIS/PERMA CATHETER REMOVAL;  Surgeon: Conrad Venango, MD;  Location: [place];  Service: Vascular;  Laterality: Left;  . ESOPHAGOGASTRODUODENOSCOPY (EGD) WITH PROPOFOL N/A 09/25/2017   Procedure: ESOPHAGOGASTRODUODENOSCOPY (EGD) WITH PROPOFOL;  Surgeon: Ronnette Juniper, MD;  Location: [place];  Service: Gastroenterology;  Laterality: N/A;  . HERNIA REPAIR    . I&D EXTREMITY Right 11/06/2017   Procedure: IRRIGATION AND DEBRIDEMENT KNEE;  Surgeon: Rod Can, MD;  Location:  [place] OR;  Service: Orthopedics;  Laterality: Right;  . I&D EXTREMITY Left 01/03/2018   Procedure: IRRIGATION AND DEBRIDEMENT OF THIGH, wound vac application  wound size 10cm x 2.5cm x 2.5cm;  Surgeon: Waynetta Sandy, MD;  Location: [place];  Service: Vascular;  Laterality: Left;  . ICD LEAD REMOVAL Right 08/20/2017   Procedure: ICD EXTRACTION WITH C-ARM;  Surgeon: Evans Lance, MD;  Location: [facility] OR;  Service: Cardiovascular;  Laterality: Right;  DR VAN TRIGT TO BACK-UP  . icd removed    . INSERTION OF DIALYSIS CATHETER N/A 11/11/2016   Procedure: INSERTION OF TUNNELED DIALYSIS CATHETER;   Surgeon: Angelia Mould, MD;  Location: [place];  Service: Vascular;  Laterality: N/A;  . INSERTION OF DIALYSIS CATHETER Left 08/24/2017   Procedure: INSERTION OF DIALYSIS CATHETER;  Surgeon: Conrad [place], MD;  Location: [place];  Service: Vascular;  Laterality: Left;  . INSERTION OF DIALYSIS CATHETER Left 09/01/2017   Procedure: INSERTION OF DIALYSIS CATHETER;  Surgeon: Conrad [place], MD;  Location: [place];  Service: Vascular;  Laterality: Left;  . KIDNEY TRANSPLANT  2009  . KNEE SURGERY Bilateral 2000s   "drained fluid"  . LAPAROSCOPIC GASTRIC BANDING  2006  . LIGATION OF ARTERIOVENOUS  FISTULA Right 11/10/2017   Procedure: LIGATION OF BRACHIAL VEIN TRANSPOSITION RIGHT ARM;  Surgeon: Angelia Mould, MD;  Location: [place];  Service: Vascular;  Laterality: Right;  . MASS EXCISION N/A 01/12/2018   Procedure: EXCISION OF MULTIPLE SUBCUTANEOUS MASSES;  Surgeon: Ralene Ok, MD;  Location: [place];  Service: General;  Laterality: N/A;  . PROSTATE BIOPSY  2009  . REVISON OF ARTERIOVENOUS FISTULA Left 8/78/6767   Procedure: PLICATION OF ARTERIOVENOUS FISTULA ANEURYSM;  Surgeon: Elam Dutch, MD;  Location: [place];  Service: Vascular;  Laterality: Left;  . REVISON OF ARTERIOVENOUS FISTULA Left 11/10/2016   Procedure: REVISON OF ARTERIOVENOUS FISTULA - LEFT UPPER ARM;  Surgeon: Elam Dutch, MD;  Location: [place];  Service: Vascular;  Laterality: Left;  . REVISON OF ARTERIOVENOUS FISTULA Left 08/24/2017   Procedure: REVISON OF ARTERIOVENOUS FISTULA LEFT;  Surgeon: Conrad Kinder, MD;  Location: [place];  Service: Vascular;  Laterality: Left;  . TEE WITHOUT CARDIOVERSION N/A 08/20/2017   Procedure: TRANSESOPHAGEAL ECHOCARDIOGRAM (TEE);  Surgeon: Evans Lance, MD;  Location: [place];  Service: Cardiovascular;  Laterality: N/A;  . THROMBECTOMY W/ EMBOLECTOMY Left 12/25/2014   Procedure: THROMBECTOMY ARTERIOVENOUS FISTULA;  Surgeon: Elam Dutch, MD;  Location: [place];  Service: Vascular;   Laterality: Left;  . THYROIDECTOMY  2011  . TOE AMPUTATION Bilateral     right 1st and  2nd digits; left 1st and 3rd digits"  . UMBILICAL HERNIA REPAIR  X 2  . UPPER EXTREMITY VENOGRAPHY Right 11/05/2017   Procedure: UPPER EXTREMITY VENOGRAPHY;  Surgeon: Conrad Gas, MD;  Location: [place] CV LAB;  Service: Cardiovascular;  Laterality: Right;  Marland Kitchen VASCULAR ACCESS DEVICE INSERTION Left 11/10/2017   Procedure: INSERTION OF SUPER HERO VASCULAR ACCESS DEVICE LEFT ARM;  Surgeon: Angelia Mould, MD;  Location: [place];  Service: Vascular;  Laterality: Left;  Marland Kitchen VEIN HARVEST Left 08/24/2017   Procedure: VEIN HARVEST;  Surgeon: Conrad Kila, MD;  Location: [place];  Service: Vascular;  Laterality: Left;  . WOUND DEBRIDEMENT Left 12/22/2017   Procedure: EXCISION OF LEFT THIGH WOUND;  Surgeon: Elam Dutch, MD;  Location: [place];  Service: Vascular;  Laterality: Left;  . WOUND DEBRIDEMENT Left 01/28/2018  Procedure: DEBRIDEMENT WOUND LEFT THIGH;  Surgeon: Marty Heck, MD;  Location: [facility] OR;  Service: Vascular;  Laterality: Left;   Family History  Family History  Problem Relation Age of Onset  . Heart failure Mother   . Hypertension Mother   . CAD Mother 62  . Emphysema Mother   . Hypertension Father   . Kidney failure Father    Social History  reports that he has never smoked. He has never used smokeless tobacco. He reports that he drinks alcohol. He reports that he does not use drugs. Allergies  Allergies  Allergen Reactions  . Enalapril Swelling and Other (See Comments)    MOUTH SWELLING EDEMA  . Iodinated Diagnostic Agents Nausea And Vomiting and Nausea Only   Home medications Prior to Admission medications   Medication Sig Start Date End Date Taking? Authorizing Provider  albuterol (PROVENTIL) (2.5 MG/3ML) 0.083% nebulizer solution Take 3 mLs (2.5 mg total) by nebulization every 6 (six) hours as needed for wheezing or shortness of breath. 01/27/18  Yes Juanito Doom, MD  ambrisentan (LETAIRIS) 10 MG tablet Take 1 tablet (10 mg total) by mouth daily. 09/11/17  Yes Juanito Doom, MD  amiodarone (PACERONE) 200 MG tablet Take 200 mg by mouth daily.    Yes [provider]  apixaban (ELIQUIS) 2.5 MG TABS tablet Take 1 tablet (2.5 mg total) by mouth 2 (two) times daily. 01/29/18  Yes Ulyses Amor, PA-C  atorvastatin (LIPITOR) 40 MG tablet Take 1 tablet (40 mg total) by mouth every evening. 12/11/17  Yes Diallo, Abdoulaye, MD  cinacalcet (SENSIPAR) 90 MG tablet Take 90 mg by mouth every evening.   Yes [provider]  levothyroxine (SYNTHROID, LEVOTHROID) 300 MCG tablet Take 1 tablet (300 mcg total) by mouth daily before breakfast. 09/26/17  Yes Enid Derry, [place], DO  midodrine (PROAMATINE) 10 MG tablet TAKE 1 TABLET (10 MG TOTAL) BY MOUTH 3 (THREE) TIMES DAILY. 10/08/17  Yes Minus Breeding, MD  multivitamin (RENA-VIT) TABS tablet TAKE 1 TABLET BY MOUTH EVERYDAY AT BEDTIME Patient taking differently: Take 1 tablet by mouth at bedtime.  01/12/18  Yes Diallo, Abdoulaye, MD  nystatin (MYCOSTATIN/NYSTOP) powder Apply topically 4 (four) times daily. Patient taking differently: Apply 1 g topically daily.  11/18/17  Yes Diallo, Abdoulaye, MD  tadalafil (ADCIRCA/CIALIS) 20 MG tablet Take 20 mg by mouth daily.  10/06/17  Yes [provider]  traMADol (ULTRAM) 50 MG tablet Take 1 tablet (50 mg total) by mouth every 6 (six) hours as needed. Patient taking differently: Take 50 mg by mouth every 6 (six) hours as needed for moderate pain.  01/12/18 01/12/19 Yes Ralene Ok, MD  diphenhydrAMINE (BENADRYL) 50 MG tablet Take 1 tablet by mouth 1 hour prior to study Patient not taking: Reported on 02/17/2018 02/12/18   Martyn Ehrich, NP  predniSONE (DELTASONE) 20 MG tablet Take 2 and 1/2 tablets (50mg ) by mouth 13 hours, 7 hours, and 1 hour prior to study. Patient not taking: Reported on 02/17/2018 02/12/18   Martyn Ehrich, NP   Liver Function  Tests Recent Labs  Lab 02/18/18 0728  AST 34  ALT 12  ALKPHOS 208*  BILITOT 0.4  PROT 6.0*  ALBUMIN 2.6*   No results for input(s): LIPASE, AMYLASE in the last 168 hours. CBC Recent Labs  Lab 02/18/18 0728 02/18/18 0838  WBC 8.2  --   NEUTROABS 7.1  --   HGB 8.5* 10.5*  HCT 27.5* 31.0*  MCV 89.6  --  PLT 192  --    Basic Metabolic Panel Recent Labs  Lab 02/18/18 0728 02/18/18 0838  NA 137 138  K 2.9* 2.7*  CL 94* 98  CO2 30  --   GLUCOSE 75 67*  BUN 16 17  CREATININE 4.35* 4.10*  CALCIUM 9.4  --    Iron/TIBC/Ferritin/ %Sat    Component Value Date/Time   IRON 41 (L) 01/04/2018 1630   TIBC 106 (L) 01/04/2018 1630   FERRITIN 2,257 (H) 08/09/2016 0433   IRONPCTSAT 39 01/04/2018 1630    Vitals:   02/18/18 0815 02/18/18 0830 02/18/18 0845 02/18/18 1004  BP: 91/64 92/67 (!) 77/57 (!) 79/48  Pulse: 66 69 68 66  Resp: (!) 8 11 11  (!) 5  Temp:      TempSrc:      SpO2: 100% 98% 99% 93%  Weight:      Height:       Exam Gen alert, no distress, tired appearing No rash, cyanosis or gangrene Sclera anicteric, throat clear  No jvd or bruits Chest clear on L and crackles R base RRR soft sem, no RG Abd soft ntnd no mass or ascites +bs obese GU normal male MS no joint effusions or deformity Ext 2+ LLE edema, 1+ RLE edema; L thigh wound is large, not bleeding L lateral chest wound 1.5x 5cm slight drainage, R flank wound 3x4 cm w/ drainage Neuro is alert, Ox 3 , nf    Home meds:  - ambirsentan 10 qd/ amiodarone 200 qd/ eliquis 2.5 bid/ midodrine 10 tid  - tadalifil 20 qd  - T4/ lipitor/ sensipar/ MVI/ ultram   Dialysis: MWF   4h  122kg  Heparin none  LUE HeRO  450/800, 3K/2.25Ca  -Hectorol 103mcg qHD -venofer 50mg  qwk -Mircera 267mcg IV q2wks - last 10/7  Impression/ Plan: 1. Bleeding L thigh wound - the healing wound is from prior sinus tract surgery; bleeding stopped for now, VVS evaluating 2. ESRD on HD MWF - plan HD tomorrow 3. Pulm  HTN 4. Chronic hypotension - cont midodrine 5. Vol overload - by CXR/ CT , exam, needs sig vol off. Cutoff BP's will be in 70's for this patient w/ severe pulm HTN.   6. AFib - on eliquis, amio 7. R & L chest wounds - per primary 8. Anemia ckd - Hb 8.5- 10, ESA recently dosed, follow trends.  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 OP phos typically in goal.  Continue Ca acetate, sensipar and hectorol.   Kelly Splinter MD [employer] pager [phone]   02/18/2018, 10:22 AM

## 2018-02-18 NOTE — Significant Event (Signed)
Rapid Response Event Note While on unit, asked to respond to bleeding event  Overview: Time Called: 2115 Arrival Time: 2115 Event Type: Other (Comment)(bleeding)  Initial Focused Assessment: Mr. Ciavarella was up to the bedside commode when his chronic Left leg wound was bleeding.  I initially removed the soiled dressing to visualize the site and a small stream of dark red blood squirted approximately 2 feet and I applied more direct pressure.  FPTS and VVS was notified. FPTS came to the bedside. After assisting Mr. Stooksbury back to bed, I re-examined the site and the distal edge of the wound was slowly oozing.  I described the event with Dr. Carlis Abbott over the telephone and hemastasis was achieved after an additional 20 mins of manual pressure was applied.  Pt was alert and responsive during the entire episode.  His only complaint was due to pain from my pressure on his leg.  Dry dressing was applied with gauze and hypofix with a tight dressing to maintain hemastasis.  Approximate total blood loss 100 cc. Family and patient were updated.   Interventions: -Manual pressure applied -Dressing applied to Left leg wound -Bedrest  Plan of Care (if not transferred): -Continue Bedrest through the night. -Monitor site for further bleeding.  If bleeding occurs, hold manual direct pressure and call RRRN.  Event Summary:   at 2200 pt stabilized in room.  HR 83 afib, BP 98/64 map 74, RR 18, sats 95% on RA.  Dressing CDI.  Call ended 2240  [place], [place]

## 2018-02-18 NOTE — ED Notes (Signed)
Surgery in room at this time.

## 2018-02-18 NOTE — ED Provider Notes (Signed)
[facility] EMERGENCY DEPARTMENT Provider Note   CSN: [id number] Arrival date & time: 02/18/18  1696     History   Chief Complaint Chief Complaint  Patient presents with  . Vascular Access Problem    HPI Adam Sine. is a 57 y.o. male hx of AICD, Afib on Eliquis, ESRD on HD (Last HD yesterday), DM, here with bleeding from left groin.  Patient states that he had a left dialysis catheter that was removed several months ago.  He actually had a wound VAC that was placed and that was removed several weeks ago.  Patient at that time was admitted for IV antibiotics.  Patient states that he did have dialysis yesterday.  This morning and walked to the bathroom and noticed blood coming down his pants.  He felt some bleeding from the wound. Denies any fevers or chills.  Patient was noted to be hypotensive with blood pressure in the 80s per EMS.   The history is provided by the patient.    Past Medical History:  Diagnosis Date  . AICD (automatic cardioverter/defibrillator) present    [employer] - s/p extraction 08/21/17 due to staph infection  . Anemia   . Aortic stenosis    moderate AS by 08/2016 echo  . Arthritis    "knees" (08/05/2016)  . Atrial fibrillation ([place])   . ESRD (end stage renal disease) on dialysis ([place])    "M, T, W, T, F; NX stage hemodialysis; I do it at home" (08/05/2016)  . 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 History of blood transfusion 2009   "S/P biopsy for prostate cancer check"  . Hypothyroidism (acquired)   . Mitral stenosis    moderate mitral stenosis  . Nonhealing surgical wound    left thigh  . Nonischemic cardiomyopathy ([place])   . Pneumonia 2014; 08/05/2016  . Pulmonary HTN ([place])   . Renal insufficiency   . Thyroid cancer [facility]) 2011    Patient Active Problem List   Diagnosis Date Noted  . Insomnia 02/11/2018  . Chills 02/11/2018  . Shortness of breath 02/11/2018  . Leukopenia   .  Wound infection 01/02/2018  . Nodule of chest wall 11/18/2017  . Calciphylaxis   . Abscess   . Acute pain of right knee   . Malnutrition of moderate degree 11/04/2017  . Septic arthritis of knee, right ([place]) 11/02/2017  . Dialysis AV fistula infection, sequela 11/02/2017  . Mass 10/29/2017  . Near syncope 09/24/2017  . Syncope, cardiogenic   . ESRD on hemodialysis ([place])   . Anemia 09/16/2017  . Diarrhea 09/16/2017  . Tarry stools 09/16/2017  . Acute encephalopathy   . Hypotension   . Complication of arteriovenous dialysis fistula   . Stage 5 chronic kidney disease on chronic dialysis ([place])   . Infected defibrillator ([place])   . MSSA bacteremia 08/18/2017  . Sepsis ([place]) 08/17/2017  . Lymphadenopathy, mediastinal 04/10/2017  . Hypothyroidism 10/09/2016  . Acute respiratory failure with hypoxia ([place]) 08/06/2016  . HCAP (healthcare-associated pneumonia) 08/05/2016  . Thyroid cancer ([place]) 06/12/2016  . Epistaxis 03/04/2016  . Long term current use of amiodarone 01/10/2016  . Critical lower limb ischemia 12/04/2015  . Carotid artery stenosis 05/24/2015  . Excess skin 05/24/2015  . Osteomyelitis of left foot ([place]) 04/30/2015  . Status post amputation 04/19/2015  . Wound dehiscence 04/19/2015  . Sinus congestion 03/08/2015  . Bleeding pseudoaneurysm of left brachiocephalic arteriovenous fistula ([place]) 12/24/2014  .  Pure hypercholesterolemia 11/09/2014  . Obesity 11/07/2014  . Healthcare maintenance 11/07/2014  . H/O malignant neoplasm of thyroid 11/07/2014  . Toe amputation status 10/13/2014  . ICD (implantable cardioverter-defibrillator) in place 07/25/2014  . ESRD (end stage renal disease) on hemodialysis 07/25/2014  . End-stage renal disease ([place]) 07/25/2014  . Automatic implantable cardioverter-defibrillator in situ 07/25/2014  . Pulmonary hypertension ([place]) 08/11/202016  . Secondary pulmonary hypertension 08/11/202016  . Atrial fibrillation [I48.91] 05/24/2014  . Long term  current use of anticoagulant therapy 05/24/2014    Past Surgical History:  Procedure Laterality Date  . ARTERIAL LINE INSERTION Right 08/24/2017   Procedure: ARTERIAL LINE INSERTION INTO RIGHT FEMORAL ARTERY;  Surgeon: Conrad Funston, MD;  Location: [facility];  Service: Vascular;  Laterality: Right;  . BASCILIC VEIN TRANSPOSITION Right 10/13/2017   Procedure: FIRST STAGE BRACHIAL VEIN TRANSPOSITION RIGHT ARM;  Surgeon: Conrad Brush, MD;  Location: [facility];  Service: Vascular;  Laterality: Right;  . BIOPSY  09/25/2017   Procedure: BIOPSY;  Surgeon: Ronnette Juniper, MD;  Location: [facility] ENDOSCOPY;  Service: Gastroenterology;;  . CARDIAC CATHETERIZATION Right 2017  . CARDIAC DEFIBRILLATOR PLACEMENT  2009  . DIALYSIS FISTULA CREATION Left 2003   "upper arm"  . DIALYSIS/PERMA CATHETER REMOVAL Left 09/01/2017   Procedure: DIALYSIS/PERMA CATHETER REMOVAL;  Surgeon: Conrad [place], MD;  Location: [facility];  Service: Vascular;  Laterality: Left;  . ESOPHAGOGASTRODUODENOSCOPY (EGD) WITH PROPOFOL N/A 09/25/2017   Procedure: ESOPHAGOGASTRODUODENOSCOPY (EGD) WITH PROPOFOL;  Surgeon: Ronnette Juniper, MD;  Location: [facility];  Service: Gastroenterology;  Laterality: N/A;  . HERNIA REPAIR    . I&D EXTREMITY Right 11/06/2017   Procedure: IRRIGATION AND DEBRIDEMENT KNEE;  Surgeon: Rod Can, MD;  Location: [facility];  Service: Orthopedics;  Laterality: Right;  . I&D EXTREMITY Left 01/03/2018   Procedure: IRRIGATION AND DEBRIDEMENT OF THIGH, wound vac application  wound size 10cm x 2.5cm x 2.5cm;  Surgeon: Waynetta Sandy, MD;  Location: [facility];  Service: Vascular;  Laterality: Left;  . ICD LEAD REMOVAL Right 08/20/2017   Procedure: ICD EXTRACTION WITH C-ARM;  Surgeon: Evans Lance, MD;  Location: [facility] OR;  Service: Cardiovascular;  Laterality: Right;  DR VAN TRIGT TO BACK-UP  . icd removed    . INSERTION OF DIALYSIS CATHETER N/A 11/11/2016   Procedure: INSERTION OF TUNNELED DIALYSIS CATHETER;  Surgeon: Angelia Mould, MD;  Location: [facility];  Service: Vascular;  Laterality: N/A;  . INSERTION OF DIALYSIS CATHETER Left 08/24/2017   Procedure: INSERTION OF DIALYSIS CATHETER;  Surgeon: Conrad [place], MD;  Location: [facility];  Service: Vascular;  Laterality: Left;  . INSERTION OF DIALYSIS CATHETER Left 09/01/2017   Procedure: INSERTION OF DIALYSIS CATHETER;  Surgeon: Conrad Torrington, MD;  Location: [facility];  Service: Vascular;  Laterality: Left;  . KIDNEY TRANSPLANT  2009  . KNEE SURGERY Bilateral 2000s   "drained fluid"  . LAPAROSCOPIC GASTRIC BANDING  2006  . LIGATION OF ARTERIOVENOUS  FISTULA Right 11/10/2017   Procedure: LIGATION OF BRACHIAL VEIN TRANSPOSITION RIGHT ARM;  Surgeon: Angelia Mould, MD;  Location: [facility];  Service: Vascular;  Laterality: Right;  . MASS EXCISION N/A 01/12/2018   Procedure: EXCISION OF MULTIPLE SUBCUTANEOUS MASSES;  Surgeon: Ralene Ok, MD;  Location: [facility];  Service: General;  Laterality: N/A;  . PROSTATE BIOPSY  2009  . REVISON OF ARTERIOVENOUS FISTULA Left 7/78/2423   Procedure: PLICATION OF ARTERIOVENOUS FISTULA ANEURYSM;  Surgeon: Elam Dutch, MD;  Location: [facility];  Service: Vascular;  Laterality: Left;  . REVISON OF ARTERIOVENOUS FISTULA Left 11/10/2016   Procedure: REVISON OF ARTERIOVENOUS FISTULA - LEFT UPPER ARM;  Surgeon: Elam Dutch, MD;  Location: [facility];  Service: Vascular;  Laterality: Left;  . REVISON OF ARTERIOVENOUS FISTULA Left 08/24/2017   Procedure: REVISON OF ARTERIOVENOUS FISTULA LEFT;  Surgeon: Conrad [place], MD;  Location: [facility];  Service: Vascular;  Laterality: Left;  . TEE WITHOUT CARDIOVERSION N/A 08/20/2017   Procedure: TRANSESOPHAGEAL ECHOCARDIOGRAM (TEE);  Surgeon: Evans Lance, MD;  Location: [facility];  Service: Cardiovascular;  Laterality: N/A;  . THROMBECTOMY W/ EMBOLECTOMY Left 12/25/2014   Procedure: THROMBECTOMY ARTERIOVENOUS FISTULA;  Surgeon: Elam Dutch, MD;  Location: [facility];  Service: Vascular;  Laterality: Left;  .  THYROIDECTOMY  2011  . TOE AMPUTATION Bilateral     right 1st and  2nd digits; left 1st and 3rd digits"  . UMBILICAL HERNIA REPAIR  X 2  . UPPER EXTREMITY VENOGRAPHY Right 11/05/2017   Procedure: UPPER EXTREMITY VENOGRAPHY;  Surgeon: Conrad Bartholomew, MD;  Location: [facility];  Service: Cardiovascular;  Laterality: Right;  Marland Kitchen VASCULAR ACCESS DEVICE INSERTION Left 11/10/2017   Procedure: INSERTION OF SUPER HERO VASCULAR ACCESS DEVICE LEFT ARM;  Surgeon: Angelia Mould, MD;  Location: [facility];  Service: Vascular;  Laterality: Left;  Marland Kitchen VEIN HARVEST Left 08/24/2017   Procedure: VEIN HARVEST;  Surgeon: Conrad Citronelle, MD;  Location: [facility];  Service: Vascular;  Laterality: Left;  . WOUND DEBRIDEMENT Left 12/22/2017   Procedure: EXCISION OF LEFT THIGH WOUND;  Surgeon: Elam Dutch, MD;  Location: [facility];  Service: Vascular;  Laterality: Left;  . WOUND DEBRIDEMENT Left 01/28/2018   Procedure: DEBRIDEMENT WOUND LEFT THIGH;  Surgeon: Marty Heck, MD;  Location: [facility];  Service: Vascular;  Laterality: Left;        Home Medications    Prior to Admission medications   Medication Sig Start Date End Date Taking? Authorizing Provider  albuterol (PROVENTIL) (2.5 MG/3ML) 0.083% nebulizer solution Take 3 mLs (2.5 mg total) by nebulization every 6 (six) hours as needed for wheezing or shortness of breath. 01/27/18  Yes Juanito Doom, MD  ambrisentan (LETAIRIS) 10 MG tablet Take 1 tablet (10 mg total) by mouth daily. 09/11/17  Yes Juanito Doom, MD  amiodarone (PACERONE) 200 MG tablet Take 200 mg by mouth daily.    Yes [provider]  apixaban (ELIQUIS) 2.5 MG TABS tablet Take 1 tablet (2.5 mg total) by mouth 2 (two) times daily. 01/29/18  Yes Ulyses Amor, PA-C  atorvastatin (LIPITOR) 40 MG tablet Take 1 tablet (40 mg total) by mouth every evening. 12/11/17  Yes Diallo, Abdoulaye, MD  cinacalcet (SENSIPAR) 90 MG tablet Take 90 mg by mouth every evening.   Yes [provider]  levothyroxine (SYNTHROID, LEVOTHROID) 300 MCG tablet Take 1 tablet (300 mcg total) by mouth daily before breakfast. 09/26/17  Yes Enid Derry, [place], DO  midodrine (PROAMATINE) 10 MG tablet TAKE 1 TABLET (10 MG TOTAL) BY MOUTH 3 (THREE) TIMES DAILY. 10/08/17  Yes Minus Breeding, MD  multivitamin (RENA-VIT) TABS tablet TAKE 1 TABLET BY MOUTH EVERYDAY AT BEDTIME Patient taking differently: Take 1 tablet by mouth at bedtime.  01/12/18  Yes Diallo, Abdoulaye, MD  nystatin (MYCOSTATIN/NYSTOP) powder Apply topically 4 (four) times daily. Patient taking differently: Apply 1 g topically daily.  11/18/17  Yes Diallo, Abdoulaye, MD  tadalafil (ADCIRCA/CIALIS) 20 MG tablet Take 20 mg  by mouth daily.  10/06/17  Yes [provider]  traMADol (ULTRAM) 50 MG tablet Take 1 tablet (50 mg total) by mouth every 6 (six) hours as needed. Patient taking differently: Take 50 mg by mouth every 6 (six) hours as needed for moderate pain.  01/12/18 01/12/19 Yes Ralene Ok, MD  diphenhydrAMINE (BENADRYL) 50 MG tablet Take 1 tablet by mouth 1 hour prior to study Patient not taking: Reported on 02/17/2018 02/12/18   Martyn Ehrich, NP  predniSONE (DELTASONE) 20 MG tablet Take 2 and 1/2 tablets (50mg ) by mouth 13 hours, 7 hours, and 1 hour prior to study. Patient not taking: Reported on 02/17/2018 02/12/18   Martyn Ehrich, NP    Family History Family History  Problem Relation Age of Onset  . Heart failure Mother   . Hypertension Mother   . CAD Mother 1  . Emphysema Mother   . Hypertension Father   . Kidney failure Father     Social History Social History   Tobacco Use  . Smoking status: Never Smoker  . Smokeless tobacco: Never Used  Substance Use Topics  . Alcohol use: Yes    Alcohol/week: 0.0 standard drinks    Comment: social  . Drug use: No     Allergies   Enalapril and Iodinated diagnostic agents   Review of Systems Review of Systems  Skin: Positive for wound.  All other  systems reviewed and are negative.    Physical Exam Updated Vital Signs BP (!) 76/51 (BP Location: Right Arm)   Pulse 72   Temp (!) 97.5 F (36.4 C) (Oral)   Resp 16   Ht 6\' 1"  (1.854 m)   Wt 122.5 kg   SpO2 93%   BMI 35.62 kg/m   Physical Exam  Constitutional: He is oriented to person, place, and time. He appears well-developed and well-nourished.  Chronically ill   HENT:  Head: Normocephalic.  Mouth/Throat: Oropharynx is clear and moist.  Eyes: Pupils are equal, round, and reactive to light. Conjunctivae and EOM are normal.  Neck: Normal range of motion. Neck supple.  Cardiovascular: Normal rate, regular rhythm and normal heart sounds.  Pulmonary/Chest: Effort normal and breath sounds normal. No stridor. No respiratory distress.  Abdominal: Soft. Bowel sounds are normal. He exhibits no distension. There is no tenderness.  Musculoskeletal:  5 in x 2 in wound in L groin, there is purulent discharge, dry blood on the inferior aspect. No active bleeding. + L femoral pulse, no obvious bruit   Neurological: He is alert and oriented to person, place, and time.  Skin: Skin is warm. There is erythema.  Psychiatric: He has a normal mood and affect.  Nursing note and vitals reviewed.      ED Treatments / Results  Labs (all labs ordered are listed, but only abnormal results are displayed) Labs Reviewed  CULTURE, BLOOD (ROUTINE X 2)  CULTURE, BLOOD (ROUTINE X 2)  COMPREHENSIVE METABOLIC PANEL  CBC WITH DIFFERENTIAL/PLATELET  URINALYSIS, ROUTINE W REFLEX MICROSCOPIC  I-STAT CG4 LACTIC ACID, ED  I-STAT CHEM 8, ED    EKG None  Radiology Ct Angio Chest W/cm &/or Wo Cm  Result Date: 02/16/2018 CLINICAL DATA:  57 year old male with shortness of breath for the past 6 weeks. Elevated D-dimer. Negative Doppler interrogation of his legs. EXAM: CT ANGIOGRAPHY CHEST WITH CONTRAST TECHNIQUE: Multidetector CT imaging of the chest was performed using the standard protocol during bolus  administration of intravenous contrast. Multiplanar CT image reconstructions and MIPs were obtained  to evaluate the vascular anatomy. CONTRAST:  175mL ISOVUE-370 IOPAMIDOL (ISOVUE-370) INJECTION 76% COMPARISON:  None. FINDINGS: Cardiovascular: No filling defects in the pulmonary arterial tree to suggest underlying pulmonary embolism. Heart size is mildly enlarged with right atrial dilatation. Small amount of pericardial fluid and/or thickening, most evident adjacent to the right atrium, unlikely to be of any hemodynamic significance at this time. No associated pericardial calcification. There is aortic atherosclerosis, as well as atherosclerosis of the great vessels of the mediastinum and the coronary arteries, including calcified atherosclerotic plaque in the left main, left anterior descending, left circumflex and right coronary arteries. Severe calcifications of the aortic valve and mitral annulus. Mediastinum/Nodes: Several prominent borderline enlarged mediastinal lymph nodes are noted, most evident in the superior mediastinum. Esophagus is unremarkable in appearance. No axillary lymphadenopathy. Lungs/Pleura: Trace right pleural effusion lying dependently. No left pleural effusion. Widespread ground-glass attenuation and mild patchy interlobular septal thickening throughout the lungs bilaterally, favored to reflect a background of mild interstitial pulmonary edema. No consolidative airspace disease. No definite suspicious appearing pulmonary nodules or masses are noted. Upper Abdomen: Aortic atherosclerosis. Musculoskeletal: There are no aggressive appearing lytic or blastic lesions noted in the visualized portions of the skeleton. Review of the MIP images confirms the above findings. IMPRESSION: 1. No evidence of pulmonary embolism. 2. Findings in the lungs suggests mild interstitial pulmonary edema. Given the small right pleural effusion and enlarged heart, findings are favored to reflect congestive heart  failure. 3. Cardiomegaly with right atrial dilatation. 4. Small pericardial effusion most evident over in the right atrium, unlikely to be of any hemodynamic significance at this time. 5. There are calcifications of the aortic valve and mitral annulus. Echocardiographic correlation for evaluation of potential valvular dysfunction may be warranted if clinically indicated. 6. Aortic atherosclerosis, in addition to left main and 3 vessel coronary artery disease. Please note that although the presence of coronary artery calcium documents the presence of coronary artery disease, the severity of this disease and any potential stenosis cannot be assessed on this non-gated CT examination. Assessment for potential risk factor modification, dietary therapy or pharmacologic therapy may be warranted, if clinically indicated. Aortic Atherosclerosis (ICD10-I70.0). Electronically Signed   By: Vinnie Langton M.D.   On: 02/16/2018 16:23    Procedures Procedures (including critical care time)  CRITICAL CARE Performed by: Wandra Arthurs   Total critical care time: 30 minutes  Critical care time was exclusive of separately billable procedures and treating other patients.  Critical care was necessary to treat or prevent imminent or life-threatening deterioration.  Critical care was time spent personally by me on the following activities: development of treatment plan with patient and/or surrogate as well as nursing, discussions with consultants, evaluation of patient's response to treatment, examination of patient, obtaining history from patient or surrogate, ordering and performing treatments and interventions, ordering and review of laboratory studies, ordering and review of radiographic studies, pulse oximetry and re-evaluation of patient's condition.   Medications Ordered in ED Medications  cefTRIAXone (ROCEPHIN) 2 g in sodium chloride 0.9 % 100 mL IVPB (has no administration in time range)  sodium chloride 0.9  % bolus 500 mL (500 mLs Intravenous New Bag/Given 02/18/18 0746)  vancomycin (VANCOCIN) 2,000 mg in sodium chloride 0.9 % 500 mL IVPB (has no administration in time range)     Initial Impression / Assessment and Plan / ED Course  I have reviewed the triage vital signs and the nursing notes.  Pertinent labs & imaging results that were available  during my care of the patient were reviewed by me and considered in my medical decision making (see chart for details).    Adam Wirt. is a 57 y.o. male here with L groin pain and bleeding. I think likely recurrent wound infection after catheter removal. Hypotensive in 70s. Will initiate code sepsis and give IV abx for cellulitis. However, since he is dialysis patient, will avoid 30 cc/kg bolus for now.   9:33 AM Lactate 1.9. WBC nl. K 2.7, glucose 60. Given 1 amp D50. BP improved to 90s then back down to 70s. Will give second 500 cc bolus. Given vanc/rocephin. I called Dr. Carlis Abbott from vascular surgery to see patient. Dr. Melvia Heaps from nephrology will dialyze him tomorrow. Family practice to admit for wound infection, hypoglycemia, hypokalemia.    Final Clinical Impressions(s) / ED Diagnoses   Final diagnoses:  None    ED Discharge Orders    None       Drenda Freeze, MD 02/18/18 [phone]

## 2018-02-19 ENCOUNTER — Encounter (HOSPITAL_COMMUNITY): Payer: Self-pay | Admitting: General Practice

## 2018-02-19 ENCOUNTER — Inpatient Hospital Stay (HOSPITAL_COMMUNITY): Payer: Medicare HMO

## 2018-02-19 DIAGNOSIS — I5030 Unspecified diastolic (congestive) heart failure: Secondary | ICD-10-CM

## 2018-02-19 LAB — RENAL FUNCTION PANEL
Albumin: 2.2 g/dL — ABNORMAL LOW (ref 3.5–5.0)
Anion gap: 12 (ref 5–15)
BUN: 13 mg/dL (ref 6–20)
CALCIUM: 8.5 mg/dL — AB (ref 8.9–10.3)
CO2: 29 mmol/L (ref 22–32)
CREATININE: 3.41 mg/dL — AB (ref 0.61–1.24)
Chloride: 97 mmol/L — ABNORMAL LOW (ref 98–111)
GFR, EST AFRICAN AMERICAN: 22 mL/min — AB (ref >60)
GFR, EST NON AFRICAN AMERICAN: 19 mL/min — AB (ref >60)
Glucose, Bld: 72 mg/dL (ref 70–99)
Phosphorus: 2.1 mg/dL — ABNORMAL LOW (ref 2.5–4.6)
Potassium: 3.1 mmol/L — ABNORMAL LOW (ref 3.5–5.1)
Sodium: 138 mmol/L (ref 135–145)

## 2018-02-19 LAB — CBC
HEMATOCRIT: 25.7 % — AB (ref 39.0–52.0)
Hemoglobin: 7.9 g/dL — ABNORMAL LOW (ref 13.0–17.0)
MCH: 27.6 pg (ref 26.0–34.0)
MCHC: 30.7 g/dL (ref 30.0–36.0)
MCV: 89.9 fL (ref 80.0–100.0)
NRBC: 0.6 % — AB (ref 0.0–0.2)
PLATELETS: 145 10*3/uL — AB (ref 150–400)
RBC: 2.86 MIL/uL — ABNORMAL LOW (ref 4.22–5.81)
RDW: 21.9 % — ABNORMAL HIGH (ref 11.5–15.5)
WBC: 6.4 10*3/uL (ref 4.0–10.5)

## 2018-02-19 LAB — GLUCOSE, CAPILLARY
GLUCOSE-CAPILLARY: 99 mg/dL (ref 70–99)
GLUCOSE-CAPILLARY: 93 mg/dL (ref 70–99)
GLUCOSE-CAPILLARY: 70 mg/dL (ref 70–99)
GLUCOSE-CAPILLARY: 88 mg/dL (ref 70–99)

## 2018-02-19 LAB — ECHOCARDIOGRAM COMPLETE
HEIGHTINCHES: 73 in
WEIGHTICAEL: 4320 [oz_av]

## 2018-02-19 LAB — TSH: TSH: 3.781 u[IU]/mL (ref 0.350–4.500)

## 2018-02-19 MED ORDER — PREDNISONE 50 MG PO TABS
50.0000 mg | ORAL_TABLET | Freq: Once | ORAL | Status: DC
  Filled 2018-02-19 (×2): qty 1

## 2018-02-19 MED ORDER — PERFLUTREN LIPID MICROSPHERE
4.0000 mL | Freq: Once | INTRAVENOUS | Status: AC
  Filled 2018-02-19: qty 4

## 2018-02-19 MED ORDER — GLUCERNA SHAKE PO LIQD
237.0000 mL | Freq: Two times a day (BID) | ORAL | Status: DC
  Administered 2018-02-19 – 2018-02-26 (×10): 237 mL via ORAL

## 2018-02-19 MED ORDER — NEPRO/CARBSTEADY PO LIQD
237.0000 mL | Freq: Two times a day (BID) | ORAL | Status: DC
  Administered 2018-02-19: 237 mL via ORAL

## 2018-02-19 MED ORDER — PERFLUTREN LIPID MICROSPHERE
1.0000 mL | INTRAVENOUS | Status: AC | PRN
  Administered 2018-02-19: 4 mL via INTRAVENOUS
  Filled 2018-02-19: qty 10

## 2018-02-19 MED ORDER — DIPHENHYDRAMINE HCL 25 MG PO CAPS
50.0000 mg | ORAL_CAPSULE | Freq: Once | ORAL | Status: DC
  Filled 2018-02-19 (×2): qty 2

## 2018-02-19 MED ORDER — ALBUMIN HUMAN 5 % IV SOLN
12.5000 g | Freq: Once | INTRAVENOUS | Status: AC
  Administered 2018-02-20: 12.5 g via INTRAVENOUS
  Filled 2018-02-19: qty 250

## 2018-02-19 MED ORDER — ACETAMINOPHEN 325 MG PO TABS
650.0000 mg | ORAL_TABLET | Freq: Four times a day (QID) | ORAL | Status: DC | PRN
  Administered 2018-02-19: 650 mg via ORAL
  Filled 2018-02-19: qty 2

## 2018-02-19 MED ORDER — COLLAGENASE 250 UNIT/GM EX OINT
TOPICAL_OINTMENT | Freq: Every day | CUTANEOUS | Status: DC
  Administered 2018-02-19 – 2018-02-26 (×6): via TOPICAL
  Filled 2018-02-19: qty 30

## 2018-02-19 MED ORDER — PRO-STAT SUGAR FREE PO LIQD
30.0000 mL | Freq: Two times a day (BID) | ORAL | Status: DC
  Administered 2018-02-19 – 2018-02-26 (×13): 30 mL via ORAL
  Filled 2018-02-19 (×15): qty 30

## 2018-02-19 MED ORDER — SODIUM CHLORIDE 0.9 % IV SOLN
1.0000 g | INTRAVENOUS | Status: DC
  Administered 2018-02-19 – 2018-02-21 (×3): 1 g via INTRAVENOUS
  Filled 2018-02-19 (×4): qty 1

## 2018-02-19 MED ORDER — PREDNISONE 50 MG PO TABS
50.0000 mg | ORAL_TABLET | Freq: Once | ORAL | Status: AC
  Administered 2018-02-20: 50 mg via ORAL
  Filled 2018-02-19: qty 1

## 2018-02-19 MED ORDER — PREDNISONE 50 MG PO TABS
50.0000 mg | ORAL_TABLET | Freq: Once | ORAL | Status: AC
  Administered 2018-02-19: 50 mg via ORAL
  Filled 2018-02-19: qty 1

## 2018-02-19 NOTE — Progress Notes (Signed)
Reviewed, agree 

## 2018-02-19 NOTE — Progress Notes (Signed)
CRITICAL VALUE ALERT  Critical Value:  BS 70  Date & Time Notied:  02/19/18 2030   Provider Notified: Yes  Orders Received/Actions taken: 8 oz of Orange juice given. BS Recheck 88

## 2018-02-19 NOTE — Discharge Summary (Signed)
[facility] Discharge Summary  Patient name: Adam [name]. Medical record number: [id number] Date of birth: 1961-04-12 Age: 57 y.o. Gender: male Date of Admission: 02/18/2018  Date of Discharge: 02/26/2018 Admitting Physician: Zenia Resides, MD  Primary Care Provider: [name], MD Consultants: Nephrology, Vascular Surgery  Indication for Hospitalization: Arterial Bleed from Wound Breakdown, SIRS vs Sepsis workup, Right flank abscess  Discharge Diagnoses/Problem List:  Left Groin Wound Hypotension, chronic Afib Pulmonary HTN ESRD Calciphylaxis Hypothyroid (s/p thyroidectomy) Anemia Hyperlipidemia  Disposition: home   Discharge Condition: Stable and improved  Discharge Exam:  General: Obese man laying in bed, talkative Cardiovascular: Normal Z3/G6, 4/6 holosystolic murmur unchanged, no gallops Respiratory: Speaking in full sentences, no signs of respiratory distress Abdomen: obese, no masses Derm: chronic skin changes, no rashes Neuro: no focal deficits  Brief Hospital Course:  Adam [name] presented to the ED on 10/10 after reports of arterial bleeding from his left groin wound. He was found to be hypotensive and his lactate was slightly elevated at 1.93 initial concerns for sepsis led to his admission.  Sepsis Workup: Broad spectrum antibiotics were initiated and blood cultures were drawn. Mr. Furey remained afebrile during his stay and lab work was without leukocytosis. His blood cultures remained negative during admission. Chest xray on admission was also unremarkable. The pt had one fever on 10/11 after d/c vanc so pt was transition from ceftriaxone to cefepime for pseudomonal coverage. He was discharged on Levaquin and will complete a total of a 14 day course at his outpatient HD.   Left Groin Wound: On the evening of his day of admission a rapid response was called because the patient had another episode of arterial bleeding  from his wound. Vascular surgery was notified and reevaluated the patient wound dehiscence and recommended Santyl for enzymatic debridement. There were no additional bleeding episodes during this admission and his Hgb remained stable.  ESRD On Dialysis: Nephrology was consulted to manage the dialysis. He was dialyzed MWF and electrolytes were carefully monitored and repleted as necessary. He remained on Ca acetate, sensipar and hectorol per nephrology recommendations. On 10/16 the pt underwent dialysis and had a clot . VVS was notified who performed a leftarm HERO graftarteriovenous cannulation under ultrasound guidance and leftarm fistulogram which showed a patent graft.   Hematochezia: Pt reported BRBPR and chronic melenic stools as well and weight loss, though the weight loss is difficult to quantify in the setting of his ESRD on dialysis. No fhx of colon cancer. He has not received a screening colonoscopy secondary to difficulties with scheduling from numerous hospitalizations. His Eliquis was held in setting of a GI bleed and his Hgb remained stable. [facility] was consulted and recommended an outpatient colonoscopy.  Hypotension: The patient is chronically hypotensive with a baseline of around 80/50. He remained on his home dose of Midodrine 10mg  TID while admitted and his blood pressures were near his baseline. He remained asymptomatic during the entire hospitalization.   Afib: The patient remained in sinus rhythm during his hospitalization. His eliquis was held in the setting of his recent bleeding episodes.  Pulmonary HTN: Echo showed worsened EF 30-35% . His pulm HTN meds were initially held in the setting of his ongoing hypotension but then he was continued on his home dose of cialis and ambrisentan . He denied SOB while admitted and his vitals were WNLs and he was maintained on room air.    Anemia of CKD:Pt remained clinically stable even in  the setting of an arterial bleed. Hgb did not  drop below transfusion threshold of 7.0.   Patient was evaluated by the primary team and found to be suitable for discharge. Return precautions were given.  Issues for Follow Up:  1. Will need to restart Eliquis at follow up please discuss need to continue medication with Cardiology. 2. Will need to follow up with Vascular surgery regarding left groin wound home wound care has been arranged. 3. Will need to follow up with general surgery for right flank wound, make sure patient complete 14 day course antibiotic. 4. HD MWF with Levaquin management (will end 10/24) 5. Follow up with GI for outpatient colonoscopy 6. Will have outpatient follow up with PCP after he sees specialists  Significant Procedures: Transthoracic echocardiogram, left arm fistulogram  Significant Labs and Imaging:  Recent Labs  Lab 02/23/18 0610 02/24/18 0743 02/26/18 0313  WBC 8.3 9.8 10.0  HGB 9.3* 8.2* 8.4*  HCT 31.7* 27.1* 28.5*  PLT 112* 86* 143*   Recent Labs  Lab 02/21/18 0428 02/22/18 0443 02/23/18 0610 02/24/18 0743 02/26/18 0313  NA 135 136 137 134* 134*  K 3.7 3.7 4.2 5.1 4.7  CL 98 98 100 98 97*  CO2 26 28 26 24 25   GLUCOSE 139* 80 74 99 99  BUN 20 30* 20 29* 34*  CREATININE 3.99* 5.07* 3.82* 4.82* 5.28*  CALCIUM 9.0 8.9 8.3* 8.5* 8.7*  PHOS 2.9 3.0 1.8* 2.9 2.8  ALBUMIN 2.2* 2.2* 2.1* 2.3* 2.4*    TTE 02/19/18:  Impressions:  - Compared to a prior study in 08/2017, the LVEF is lower at 30-35%   with severe global hypokinesis, grade 1 DD and elevated LV   filling pressure. There is severe RV enlargement with moderate RV   Systolic dysfunction, severe TR and severe RAE, RVSP is 61 mmHg,   the IVC is dilated. The aortic valve is calcified with moderate   stenosis - mean gradient of 26 mmHg and AVA around 1.2 cm2.  PROCEDURE 10/17:  1. leftarm HERO graftarteriovenous cannulation under ultrasound guidance 2. leftarm fistulogram  PRE-OPERATIVE DIAGNOSIS:  MalfunctioningleftHERO graft  POST-OPERATIVE DIAGNOSIS: same as above   FINDING(S): 1. The left arm hero graft appeared widely patent. I saw no evidence of anastomotic stenosis to the brachial artery. The entire graft appeared widely patent and was properly positioned in the right atrium with no overt evidence of thrombus around the distal catheter in the right atrium.  Results/Tests Pending at Time of Discharge:   Discharge Medications:  Allergies as of 02/26/2018      Reactions   Enalapril Swelling, Other (See Comments)   MOUTH SWELLING EDEMA   Iodinated Diagnostic Agents Nausea And Vomiting, Nausea Only      Medication List    TAKE these medications   albuterol (2.5 MG/3ML) 0.083% nebulizer solution Commonly known as:  PROVENTIL Take 3 mLs (2.5 mg total) by nebulization every 6 (six) hours as needed for wheezing or shortness of breath.   ambrisentan 10 MG tablet Commonly known as:  LETAIRIS Take 1 tablet (10 mg total) by mouth daily.   amiodarone 200 MG tablet Commonly known as:  PACERONE Take 200 mg by mouth daily.   apixaban 2.5 MG Tabs tablet Commonly known as:  ELIQUIS Take 1 tablet (2.5 mg total) by mouth 2 (two) times daily.   atorvastatin 40 MG tablet Commonly known as:  LIPITOR Take 1 tablet (40 mg total) by mouth every evening.   cinacalcet 90 MG tablet  Commonly known as:  SENSIPAR Take 90 mg by mouth every evening.   diphenhydrAMINE 50 MG tablet Commonly known as:  BENADRYL Take 1 tablet by mouth 1 hour prior to study   levofloxacin 500 MG tablet Commonly known as:  LEVAQUIN Take 1 tablet (500 mg total) by mouth every other day.   levothyroxine 300 MCG tablet Commonly known as:  SYNTHROID, LEVOTHROID Take 1 tablet (300 mcg total) by mouth daily before breakfast.   midodrine 10 MG tablet Commonly known as:  PROAMATINE TAKE 1 TABLET (10 MG TOTAL) BY MOUTH 3 (THREE) TIMES DAILY.   multivitamin Tabs tablet TAKE 1 TABLET BY MOUTH EVERYDAY AT  BEDTIME What changed:  See the new instructions.   nystatin powder Commonly known as:  MYCOSTATIN/NYSTOP Apply topically 4 (four) times daily. What changed:    how much to take  when to take this   pantoprazole 40 MG tablet Commonly known as:  PROTONIX Take 1 tablet (40 mg total) by mouth 2 (two) times daily.   predniSONE 20 MG tablet Commonly known as:  DELTASONE Take 2 and 1/2 tablets (50mg ) by mouth 13 hours, 7 hours, and 1 hour prior to study.   tadalafil 20 MG tablet Commonly known as:  ADCIRCA/CIALIS Take 20 mg by mouth daily.   traMADol 50 MG tablet Commonly known as:  ULTRAM Take 1 tablet (50 mg total) by mouth every 6 (six) hours as needed. What changed:  reasons to take this       Discharge Instructions: Please refer to Patient Instructions section of EMR for full details.  Patient was counseled important signs and symptoms that should prompt return to medical care, changes in medications, dietary instructions, activity restrictions, and follow up appointments.   Follow-Up Appointments: Follow-up Information    [facility]. Go on 03/01/2018.   Why:  Go to appointmnet at 11:00 AM. Please bring all mediciations and arrive 15 minutes early. Contact information: [address] [name], [facility] Follow up.   Specialty:  Home Health Services Why:  home health services arranged Contact information: [address] [place] [address] [phone]        Ralene Ok, MD Follow up.   Specialty:  General Surgery Why:  Your appointment is at 9:10 AM.  Be at the office 30 minutes early for check in.  Bring photo ID and insurance informtion. Contact information: [address] [place]  [address] [phone]           [name], MD 02/26/2018, 2:20 PM

## 2018-02-19 NOTE — Progress Notes (Signed)
Family Medicine Teaching Service Daily Progress Note Intern Pager: [phone]  Patient name: Adam [name]. Medical record number: [id number] Date of birth: 09/28/1960 Age: 57 y.o. Gender: male  Primary Care Provider: [name], MD Consultants: Nephrology Code Status: Full  Pt Overview and Major Events to Date:  [name] rapid response was called overnight secondary to dark red blood squirting from the left groin wound. FPTS and VVS was notified and came to bedside. Hemostasis was achieved after applying manual pressure for 20 minutes. Adam [name] remained alert and responsive duringthe entire episode. EBL was 100cc.   Vitals:  HR 83 afib, BP 98/64 map 74, RR 18, sats 95% on RA.  Dressing CDI. Family was updated.   Assessment and Plan: Adam [name]. is [name] 57 y.o. male presenting with bleeding from L groin wound. PMH is significant for ESRD on dialysis MWF, paroxymal atrial fibrillation on eliquis, pulmonary hypertension, aortic stenosis, s/p thyroidectomy in 2011, and calciphylaxis.   1 out of 4 SIRS Criteria Admitted for suspected sepsis although only has 1 out of 4 SIRS criteria. Known nonhealing left groin ulcer that was suspected to be the source. Vascular surgery have evaluated left groin wound and do not feel it is infected, appreciate their recs. Will continue broad spectrum abx until blood cultures result. If negative will likely de-escalate abx coverage quickly. Lactic acid has normalized to 1.66. Pt appears clinically stable. Remains hypotensive to ~70/40 but this is chronic and pt remains on his home dose of midodrine 10mg  TID. Remains afebrile and no leukocytosis -Vital signs per floor routine -Blood cultures pending; will CTM -Continue vanc/ctx (10/10-)  Left Groin Wound: Appears chronic in nature, no active bleeding on exam today but RR called overnight for [name] potential arterial bleed. Vascular surgery believes the bleeding is superficial in nature. Wound more shallow  - showing signs granulation - some fibrinous exudate in wound bed -Will continue nursing dressing checks q1h -Will continue wedge dressing; avoid dry dressing and continue enzymatic debridement with Santyl; if it fails to progress will return to the OR for additional debridement -Wound care ordered -Vascular surgery following, appreciate recs  Hypotension, chronic: Baseline blood pressures ~70/40 which is where the pt has been during admission. Patient's only positive SIRS criteria. Is tough to discern if Bps are just his chronic baseline or if they are really from some infectious source. Would lean towards his chronic baseline given his overall clinical picture. -Will continue home dose of Midodrine 10mg  TID with meals -CTM for acute changes in BP or clinical status  Afib, chronic: Continues to be in sinus rhythm on exam.  -Will CTM and hold Eliquis given epidodes of bleeding -Restart Eliquis when appropriate  Pulmonary HTN, chronic: Likely contributing to ongoing SOB which has improved since admission. Last echo 4/19 with RV pressure and volume overload, +AS with mean gradient 8mmHg.  -Echo pending -Will consider TEE if blood cx are positive -holding home pulm HTN meds while hypotensive  ESRD on dialysis, chronic: Pt normally recieves dialysis MWF. He went to HD on 10/10. Dialysis attempted 10/11 but unable to access HeRO graft likely due to hypotension. Dialysis schedule per nephro, plan for 10/12. On exam appears hypervolemic.  -Daily BMPs -Phoslo 1334mg  TID -Cinacalet 90g MWF  Calciphylaxis: Right flank and left posterior shoulder/axilla with recent removal of cysts. On exam nontender and healing appropriately -Wound care consulted  Thyroid cancer s/p thyroidectomy: Pt remains clinically stable without signs of hypothyroidism.  -Continue synthroid 328mcg daily  Anemia,  chronic: Hgb dropped to 7.9 from 10.5 on admission; likely multifactorial in nature (dilutional, ESRD, and  bleeding episodes) -Transfusion threshold 7.0 or if clinical deterioration  -Will CTM -PM Hgb -Daily CBC  Hyperlipidemia, chronic: Clinically stable -Continue Lipitor 40mg   FEN/GI: renal diet Prophylaxis: SCDs  Disposition: pending clinical improvement  Subjective:  Patient reports overall he feels well. Denies any SOB, chest pain, n/v/d. He is tolerating food okay. He was currently receiving dialysis when I examined him and said he was somewhat tired from that. His only concern today is the pain from his leg which is worsened with manipulation.  Objective: Temp:  [97.3 F (36.3 C)-98 F (36.7 C)] 97.3 F (36.3 C) (10/11 0534) Pulse Rate:  [56-74] 72 (10/10 1705) Resp:  [5-21] 12 (10/11 0656) BP: (71-103)/(41-82) 73/43 (10/11 0656) SpO2:  [92 %-100 %] 95 % (10/10 1705) Physical Exam: General: Obese, AA male laying in bed, dialysis machine running Cardiovascular: 4/6 holosystolic murmur best heard at 2nd ICS.  Respiratory: Front lung fields CTAB, no wheezing, no signs of respiratory distress Abdomen: Soft, nontender to palpation, nondistended Derm: Groin wound tender to touch with dressing in place, no signs of active bleeding or drainage  Laboratory: Recent Labs  Lab 02/18/18 0728 02/18/18 0838 02/19/18 0326  WBC 8.2  --  6.4  HGB 8.5* 10.5* 7.9*  HCT 27.5* 31.0* 25.7*  PLT 192  --  145*   Recent Labs  Lab 02/18/18 0728 02/18/18 0838 02/19/18 0326  NA 137 138 138  K 2.9* 2.7* 3.1*  CL 94* 98 97*  CO2 30  --  29  BUN 16 17 13   CREATININE 4.35* 4.10* 3.41*  CALCIUM 9.4  --  8.5*  PROT 6.0*  --   --   BILITOT 0.4  --   --   ALKPHOS 208*  --   --   ALT 12  --   --   AST 34  --   --   GLUCOSE 75 67* 72    TSH: 3.781 Blood Culture: Pending  Imaging/Diagnostic Tests: Chest Xray: Study Date: 10/10  IMPRESSION: Enlargement of cardiac silhouette with pulmonary vascular congestion and minimal chronic failure.  No new infiltrates  identified.  Adam [name], Adam [name], Medical Student 02/19/2018, 7:46 AM FPTS Intern pager: [phone], text pages welcome --------------------------------------------------------------------------------------------------- Upper Level Addendum: I have seen and evaluated this patient along with Adam Peterson MS-3 and reviewed the above note, making necessary revisions in brown.  Guadalupe Dawn MD PGY-2 Family Medicine Resident

## 2018-02-19 NOTE — Progress Notes (Signed)
Pt assisted to [facility]. Pt began actively bleeding from L groin wound while on BSC. Held manual pressure until charge nurse and rapid response arrived. MD paged and arrived within 10 minutes. BP was 90/58. All other vital signs stable. Rapid response nurse manually held pressure for 20 min until bleeding stopped, then dressed wound with gauze and ABD pad. Will continue to monitor.

## 2018-02-19 NOTE — Progress Notes (Signed)
Spoke to [employer] again to make aware of fever going up to 102.7 applied ice pack and came down only to 102.1. Rechecked vitals as well as a manual B/P. Will continue to monitor.

## 2018-02-19 NOTE — Progress Notes (Signed)
Pharmacy Antibiotic Note  Adam [name]. is a 57 y.o. male admitted on 02/18/2018 with sepsis.  Pharmacy has been consulted for vancomycin dosing. Pt is afebrile and WBC is WNL. Lactic acid is normal.   He had some issue with his HD graft today so did not get HD. Plan for tomorrow. MD has change ceftriaxone to cefepime due to fever on current regimen. We will dose cefepime q24 for now until regular HD schedule is establish  Plan: F/u with HD tomorrow for vanc dose Cefepime 1g IV q24 for now  Height: 6\' 1"  (185.4 cm) Weight: (on stretcher, no weight obtained) IBW/kg (Calculated) : 79.9  Temp (24hrs), Avg:99 F (37.2 C), Min:97.3 F (36.3 C), Max:102.7 F (39.3 C)  Recent Labs  Lab 02/18/18 0728 02/18/18 0838 02/18/18 0935 02/19/18 0326  WBC 8.2  --   --  6.4  CREATININE 4.35* 4.10*  --  3.41*  LATICACIDVEN  --  1.93* 1.66  --     Estimated Creatinine Clearance: 33.5 mL/min (A) (by C-G formula based on SCr of 3.41 mg/dL (H)).    Allergies  Allergen Reactions  . Enalapril Swelling and Other (See Comments)    MOUTH SWELLING EDEMA  . Iodinated Diagnostic Agents Nausea And Vomiting and Nausea Only    Antimicrobials this admission: Vanc 10/10>> CTX 10/10>>10/11 Cefepime 10/11>>  Dose adjustments this admission: N/A  Microbiology results: 10/10 blood>> 10/10 c.diff>> 10/10 MRSA PCR>>neg  Onnie Boer, PharmD, [place], AAHIVP, CPP Infectious Disease Pharmacist Pager: [phone] 02/19/2018 7:20 PM

## 2018-02-19 NOTE — Progress Notes (Signed)
Initial Nutrition Assessment  DOCUMENTATION CODES:   Non-severe (moderate) malnutrition in context of chronic illness, Obesity unspecified  INTERVENTION:   - d/c Nepro due to pt preference  - Glucerna Shake po BID, each supplement provides 220 kcal and 10 grams of protein (chocolate)  - Prostat 30 ml BID, each supplement provides 100 kcals and 15 grams protein  - Encourage adequate PO intake  - Recommend renal MVI daily  NUTRITION DIAGNOSIS:   Moderate Malnutrition related to chronic illness (ESRD on HD) as evidenced by mild fat depletion, moderate fat depletion, mild muscle depletion, moderate muscle depletion.  GOAL:   Patient will meet greater than or equal to 90% of their needs  MONITOR:   PO intake, Supplement acceptance, Weight trends, Labs, I & O's, Skin  REASON FOR ASSESSMENT:   Malnutrition Screening Tool    ASSESSMENT:   57 year old male who presented to the ED on 10/10 with bleeding from a left groin wound. Pt previously had a tunneled dialysis catheter in left groin. PMH significant for atrial fibrillation on Eliquis, ESRD on HD, anemia, hypothyroidism, hypertension, calciphylaxis, and hyperlipidemia.   Per Nephrology, dry weight is 122 kg. Net UF from HD yesterday was 1500 ml.  Discussed pt with RN who reports pt is not eating solid foods but completed 100% of Glucerna oral nutrition supplement.  Spoke with pt and wife at bedside. Pt's wife is very concerned about pt's nutritional status and states that pt's pulmonologist recently visited and discussed the importance of adequate protein intake.  Pt states that he is willing to drink Glucerna shakes but not Nepro shakes due to preference. RD to d/c order for Nepro and order Glucerna BID. Pt also willing to try Pro-stat to maximize protein intake since pt's poor appetite persists.  Pt's wife reports that pt has had very little to eat over the past 2-3 weeks due to persistent diarrhea and decreased appetite.  Per pt, he is scared to eat because he will have to go immediately to the bathroom. Pt's wife reports that pt has had liquid stools consistently throughout the day over the past several weeks.  Over the past 2-3 weeks, pt has had 2-3 meals daily. A meal may include ham, scrambled eggs, and bread or mashed potatoes, pork chop, and broccoli.  Pt's wife endorses continued weight loss over the past 1 year. Per pt's wife, pt's EDW has been decreased over time. Pt's EDW used to be 127 kg but is now 122 kg. Pt's admission weight was 122.5 kg.  RD provided education regarding the importance of adequate kcal and protein intake in healing. Pt and wife expressed understanding.  Medications reviewed and include: Phoslo TID with meals - pt refusing, Nepro BID - pt refusing, levothyroxine 300 mg daily, IV antibiotics  Labs reviewed: potassium 3.1 (L), chloride 97 (L), creatinine 3.41 (H), phosphorus 2.1 (L), hemoglobin 7.9 (L), HCT 25.7 (L) CBG's: 57, 60  NUTRITION - FOCUSED PHYSICAL EXAM:    Most Recent Value  Orbital Region  Mild depletion  Upper Arm Region  Moderate depletion  Thoracic and Lumbar Region  No depletion  Buccal Region  Mild depletion  Temple Region  No depletion  Clavicle Bone Region  Mild depletion  Clavicle and Acromion Bone Region  Mild depletion  Scapular Bone Region  Unable to assess  Dorsal Hand  No depletion  Patellar Region  Mild depletion  Anterior Thigh Region  Moderate depletion  Posterior Calf Region  Mild depletion  Edema (RD Assessment)  Moderate [generalized]  Hair  Reviewed  Eyes  Reviewed  Mouth  Reviewed  Skin  Reviewed  Nails  Reviewed       Diet Order:   Diet Order            Diet renal with fluid restriction Fluid restriction: 1200 mL Fluid; Room service appropriate? Yes; Fluid consistency: Thin  Diet effective now              EDUCATION NEEDS:   Education needs have been addressed  Skin:  Skin Assessment: Skin Integrity Issues: Incisions:  groin Other: wounds to L flank, R lumbar region  Last BM:  10/10 - medium type 6  Height:   Ht Readings from Last 1 Encounters:  02/18/18 6\' 1"  (1.854 m)    Weight:   Wt Readings from Last 1 Encounters:  02/18/18 122.5 kg   Adjusted body weight: 113.5 kg  Ideal Body Weight:  83.64 kg  BMI:  Body mass index is 35.62 kg/m.  Estimated Nutritional Needs:   Kcal:  2200-2400  Protein:  120-135 grams  Fluid:  UOP + 1000 ml    Gaynell Face, MS, RD, LDN Inpatient Clinical Dietitian Pager: [phone] Weekend/After Hours: [phone]

## 2018-02-19 NOTE — Progress Notes (Signed)
Pts BP lower than normal in 70s. Pt asymtomatic, and shows no sign of distress. Groin wound shows no sign of bleeding. Dressing is clean, dry and intact. MD made aware. Will continue to monitor.

## 2018-02-19 NOTE — Progress Notes (Signed)
Spoke to [employer] with family medicine team to make sure the team was aware of the B/P's staying in to 70's to 80's range. Also the small drop of Hgb to 7.9.

## 2018-02-19 NOTE — Progress Notes (Signed)
  Echocardiogram 2D Echocardiogram has been performed.  Adam [name] 02/19/2018, 1:09 PM

## 2018-02-19 NOTE — Progress Notes (Addendum)
Subjective:  No cos,  Hd yest sec vol / No sob today/. MWF normal schedule ,Unfortunately  Avgg not Patent in HD this am  per HD RN.  seen in room with good bruit.   Objective Vital signs in last 24 hours: Vitals:   02/19/18 0656 02/19/18 0717 02/19/18 0841 02/19/18 0956  BP: (!) 73/43 (!) 85/43 99/60 96/68   Pulse:  77 84   Resp: 12 16 (!) 21 17  Temp:   (!) 97.4 F (36.3 C) (!) 97.4 F (36.3 C)  TempSrc:   Oral   SpO2:      Weight:      Height:       Weight change:   Physical Exam: General: alert, OX3, ,NAD , Chrnically ill appearing  Heart: RRR 1/6 sem , no rug or gallop Lungs: CTA  , unlabored  Abdomen: Bs pos , NT,ND  Extremities: 2+ LLE edema , L groin wound stable  No bleeding / RLE  1+/ L lat. Chest wound , R flank wound dressing  Dialysis Access: pos bruit LUA  Hero AVG    Home meds:  - ambirsentan 10 qd/ amiodarone 200 qd/ eliquis 2.5 bid/ midodrine 10 tid  - tadalifil 20 qd  - T4/ lipitor/ sensipar/ MVI/ ultram   OP Dialysis: MWF   [facility]   4h  122kg  Heparin none  LUE HeRO  450/800, 3K/2.25Ca  -Hectorol 20mcg qHD -venofer 50mg  qwk -Mircera 28mcg IV q2wks - last 10/7   Problem/Plan: 1.  Bleeding L thigh wound - Dr Carlis Abbott eval./ managing ,  wound  from prior sinus tract surgery 2. Vol overload - by CXR/ CT , exam, needs sig vol off.= HD ysert 1.5 l uf  With bp drop to 70s and  Cutoff BP's will be in 70's for this patient w/ severe pulm HTN. Continue Midodrine  10 mg tid  3. ESRD on HD MWF - plan HD today on schedule / however hero AVG not able to cannulate with good bruit . 4. L UA Hero AVG malfunction  - Patient refused IR eval   5. Pulm HTN 6. Chronic hypotension - cont midodrine 7.   AFib - on eliquis, amio 8. R & L chest wounds - per primary 9. Anemia ckd - Hb 8.5- 10.5 > 7.9 , ESA recently dosed 10/07 , follow trends.  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     Will plan for HD in Am  With Midodrine 10 mg tid   To help with  BP HD    Ernest Haber, PA-C [place] [phone] 02/19/2018,11:10 AM  LOS: 1 day   Pt seen, examined and agree w A/P as above.  Kelly Splinter MD [facility] pager [phone]   02/19/2018, 11:41 AM    Labs: Basic Metabolic Panel: Recent Labs  Lab 02/18/18 0728 02/18/18 0838 02/19/18 0326  NA 137 138 138  K 2.9* 2.7* 3.1*  CL 94* 98 97*  CO2 30  --  29  GLUCOSE 75 67* 72  BUN 16 17 13   CREATININE 4.35* 4.10* 3.41*  CALCIUM 9.4  --  8.5*  PHOS  --   --  2.1*   Liver Function Tests: Recent Labs  Lab 02/18/18 0728 02/19/18 0326  AST 34  --   ALT 12  --   ALKPHOS  208*  --   BILITOT 0.4  --   PROT 6.0*  --   ALBUMIN 2.6* 2.2*   No results for input(s): LIPASE, AMYLASE in the last 168 hours. No results for input(s): AMMONIA in the last 168 hours. CBC: Recent Labs  Lab 02/18/18 0728 02/18/18 0838 02/19/18 0326  WBC 8.2  --  6.4  NEUTROABS 7.1  --   --   HGB 8.5* 10.5* 7.9*  HCT 27.5* 31.0* 25.7*  MCV 89.6  --  89.9  PLT 192  --  145*   Cardiac Enzymes: No results for input(s): CKTOTAL, CKMB, CKMBINDEX, TROPONINI in the last 168 hours. CBG: Recent Labs  Lab 02/18/18 0922 02/18/18 1307  GLUCAP 60* 75*    Studies/Results: Dg Chest Port 1 View  Result Date: 02/18/2018 CLINICAL DATA:  Fever, chills, shortness of breath, open leg wound, history atrial fibrillation, thyroid cancer, end-stage renal disease on dialysis, non ischemic cardiomyopathy, pulmonary hypertension EXAM: PORTABLE CHEST 1 VIEW COMPARISON:  Portable exam 0714 hours compared to 11/10/2017 FINDINGS: Large bore LEFT jugular dialysis catheter with tip projecting over RIGHT atrium. Enlargement of cardiac silhouette with pulmonary vascular congestion. Minimal accentuation of perihilar interstitial markings likely reflects mild chronic failure. No acute infiltrates, pleural  effusion or pneumothorax. Vascular stents identified at the LEFT axilla into LEFT upper extremity. Atherosclerotic calcification aorta. IMPRESSION: Enlargement of cardiac silhouette with pulmonary vascular congestion and minimal chronic failure. No new infiltrates identified. Electronically Signed   By: Lavonia Dana M.D.   On: 02/18/2018 07:49   Medications: . cefTRIAXone (ROCEPHIN)  IV 2 g (02/19/18 0957)  . vancomycin 1,000 mg (02/18/18 1600)   . atorvastatin  40 mg Oral QPM  . calcium acetate  1,334 mg Oral TID WC  . Chlorhexidine Gluconate Cloth  6 each Topical Q0600  . Chlorhexidine Gluconate Cloth  6 each Topical Q0600  . cinacalcet  90 mg Oral Q M,W,F-1800  . collagenase   Topical Daily  . doxercalciferol  1 mcg Intravenous Q M,W,F-HD  . feeding supplement (NEPRO CARB STEADY)  237 mL Oral BID BM  . levothyroxine  300 mcg Oral QAC breakfast  . midodrine  10 mg Oral TID WC

## 2018-02-19 NOTE — Progress Notes (Signed)
Unable to cannulate Patient's AVG, 3 attempts were made by this nurse and CN [facility]. Reported to Dr Jonnie Finner

## 2018-02-19 NOTE — Progress Notes (Signed)
[facility]  Subjective  - Had a bleeding episode overnight from left groin wound.  Hemostasis achieved with manual pressure.   Objective (!) 73/43 72 (!) 97.3 F (36.3 C) (Oral) 12 95%  Intake/Output Summary (Last 24 hours) at 02/19/2018 0810 Last data filed at 02/19/2018 0500 Gross per 24 hour  Intake 2700.01 ml  Output 1500 ml  Net 1200.01 ml    General NAD, resting in dialysis Left groin wound as pictured from consult yesterday, wound more shallow - showing signs granulation - some fibrinous exudate in wound bed  Laboratory Lab Results: Recent Labs    02/18/18 0728 02/18/18 0838 02/19/18 0326  WBC 8.2  --  6.4  HGB 8.5* 10.5* 7.9*  HCT 27.5* 31.0* 25.7*  PLT 192  --  145*   BMET Recent Labs    02/18/18 0728 02/18/18 0838 02/19/18 0326  NA 137 138 138  K 2.9* 2.7* 3.1*  CL 94* 98 97*  CO2 30  --  29  GLUCOSE 75 67* 72  BUN 16 17 13   CREATININE 4.35* 4.10* 3.41*  CALCIUM 9.4  --  8.5*    COAG Lab Results  Component Value Date   INR 1.21 01/12/2018   INR 1.32 12/22/2017   INR 1.29 12/12/2017   No results found for: PTT  Assessment/Planning:  Left groin wound from excision of old sinus tract from tunneled dialysis catheter. Avoid a dry dressing in left groin wound since it is desiccating a granulating wound.  Would try Santyl for enzymatic debridement for now.  If fails to show progress would have to return to OR for additional debridement.  Adam [name] 02/19/2018 8:10 AM --  Adam [name]

## 2018-02-19 NOTE — Progress Notes (Signed)
FPTS Interim Progress Note  S:Nurse called stating patient had a fever of 101 and that wife had many concerns. Went up to see patient. Patient and wife informed me of two wounds on his flank/back that were not healing and that he had had for several weeks now after a cyst removal. Also informed me of a "lump" on his right neck.  O: BP (!) 84/52   Pulse 84   Temp (!) 101.5 F (38.6 C) (Oral) Comment: Ginger, RN aware  Resp (!) 23   Ht 6\' 1"  (1.854 m)   Wt 122.5 kg   SpO2 95%   BMI 35.62 kg/m   Physical Exam:  Gen: NAD, alert, non-toxic, well-appearing, lying comfortably  Neck: No lump palpable on PE. Patient says it had gone down since earlier. Skin: Warm and dry. Lesion on groin and flanks bandaged  CV: 4/6 holosystolic murmur best heard at 2nd ICS  Resp: CTAB.  No wheezing, rales, abnormal lung sounds.  No increased WOB   A/P: Tylenol prn for fever Wound care consulted "Right Neck Lump" most likely a lymph node as a palpable LN was noted on his left neck, although I was unable to palpate any lump during PE. Patient noted it had went away since this morning.  Informed patient to let us know if he has any other concerns.  Danna Hefty, DO 02/19/2018, 5:25 PM PGY-1, [place] [facility] pager [phone]

## 2018-02-20 LAB — CBC WITH DIFFERENTIAL/PLATELET
BASOS PCT: 0 %
Basophils Absolute: 0 10*3/uL (ref 0.0–0.1)
EOS ABS: 0 10*3/uL (ref 0.0–0.5)
Eosinophils Relative: 0 %
HEMATOCRIT: 28.5 % — AB (ref 39.0–52.0)
HEMOGLOBIN: 8.5 g/dL — AB (ref 13.0–17.0)
Lymphocytes Relative: 1 %
Lymphs Abs: 0.1 10*3/uL — ABNORMAL LOW (ref 0.7–4.0)
MCH: 27.3 pg (ref 26.0–34.0)
MCHC: 29.8 g/dL — AB (ref 30.0–36.0)
MCV: 91.6 fL (ref 80.0–100.0)
MONO ABS: 0 10*3/uL — AB (ref 0.1–1.0)
Monocytes Relative: 0 %
NEUTROS ABS: 8 10*3/uL — AB (ref 1.7–7.7)
NEUTROS PCT: 99 %
NRBC: 0.2 % (ref 0.0–0.2)
Platelets: 121 10*3/uL — ABNORMAL LOW (ref 150–400)
RBC: 3.11 MIL/uL — ABNORMAL LOW (ref 4.22–5.81)
RDW: 22.3 % — ABNORMAL HIGH (ref 11.5–15.5)
WBC: 8.1 10*3/uL (ref 4.0–10.5)
nRBC: 2 /100 WBC — ABNORMAL HIGH

## 2018-02-20 LAB — RENAL FUNCTION PANEL
ANION GAP: 13 (ref 5–15)
Albumin: 2.4 g/dL — ABNORMAL LOW (ref 3.5–5.0)
BUN: 17 mg/dL (ref 6–20)
CO2: 23 mmol/L (ref 22–32)
Calcium: 8.6 mg/dL — ABNORMAL LOW (ref 8.9–10.3)
Chloride: 97 mmol/L — ABNORMAL LOW (ref 98–111)
Creatinine, Ser: 4.64 mg/dL — ABNORMAL HIGH (ref 0.61–1.24)
GFR, EST AFRICAN AMERICAN: 15 mL/min — AB (ref >60)
GFR, EST NON AFRICAN AMERICAN: 13 mL/min — AB (ref >60)
GLUCOSE: 110 mg/dL — AB (ref 70–99)
PHOSPHORUS: 2.8 mg/dL (ref 2.5–4.6)
POTASSIUM: 3.6 mmol/L (ref 3.5–5.1)
Sodium: 133 mmol/L — ABNORMAL LOW (ref 135–145)

## 2018-02-20 LAB — GLUCOSE, CAPILLARY
GLUCOSE-CAPILLARY: 102 mg/dL — AB (ref 70–99)
GLUCOSE-CAPILLARY: 140 mg/dL — AB (ref 70–99)
Glucose-Capillary: 114 mg/dL — ABNORMAL HIGH (ref 70–99)
Glucose-Capillary: 95 mg/dL (ref 70–99)
Glucose-Capillary: 156 mg/dL — ABNORMAL HIGH (ref 70–99)

## 2018-02-20 LAB — GASTROINTESTINAL PANEL BY PCR, STOOL (REPLACES STOOL CULTURE)
Adenovirus F40/41: NOT DETECTED
Astrovirus: NOT DETECTED
Campylobacter species: NOT DETECTED
Cryptosporidium: NOT DETECTED
Cyclospora cayetanensis: NOT DETECTED
E. COLI O157: NOT DETECTED
ENTAMOEBA HISTOLYTICA: NOT DETECTED
ENTEROPATHOGENIC E COLI (EPEC): NOT DETECTED
Enteroaggregative E coli (EAEC): NOT DETECTED
Enterotoxigenic E coli (ETEC): NOT DETECTED
Giardia lamblia: NOT DETECTED
NOROVIRUS GI/GII: NOT DETECTED
Plesimonas shigelloides: NOT DETECTED
Rotavirus A: NOT DETECTED
Salmonella species: NOT DETECTED
Sapovirus (I, II, IV, and V): NOT DETECTED
Shiga like toxin producing E coli (STEC): NOT DETECTED
Shigella/Enteroinvasive E coli (EIEC): NOT DETECTED
VIBRIO CHOLERAE: NOT DETECTED
VIBRIO SPECIES: NOT DETECTED
Yersinia enterocolitica: NOT DETECTED

## 2018-02-20 LAB — CLOSTRIDIUM DIFFICILE BY PCR, REFLEXED: Toxigenic C. Difficile by PCR: NEGATIVE

## 2018-02-20 LAB — C DIFFICILE QUICK SCREEN W PCR REFLEX
C DIFFICILE (CDIFF) TOXIN: NEGATIVE
C DIFFICLE (CDIFF) ANTIGEN: POSITIVE — AB

## 2018-02-20 MED ORDER — COLLAGENASE 250 UNIT/GM EX OINT
TOPICAL_OINTMENT | Freq: Two times a day (BID) | CUTANEOUS | Status: DC
  Administered 2018-02-21 – 2018-02-26 (×9): via TOPICAL
  Filled 2018-02-20: qty 30

## 2018-02-20 MED ORDER — VANCOMYCIN HCL IN DEXTROSE 1-5 GM/200ML-% IV SOLN
1000.0000 mg | Freq: Once | INTRAVENOUS | Status: AC
  Administered 2018-02-20: 1000 mg via INTRAVENOUS
  Filled 2018-02-20: qty 200

## 2018-02-20 MED ORDER — VANCOMYCIN HCL IN DEXTROSE 1-5 GM/200ML-% IV SOLN: 1000.0000 mg | INTRAVENOUS | Status: DC

## 2018-02-20 MED ORDER — MIDODRINE HCL 5 MG PO TABS
ORAL_TABLET | ORAL | Status: AC
  Filled 2018-02-20: qty 2

## 2018-02-20 MED ORDER — DOXERCALCIFEROL 4 MCG/2ML IV SOLN
INTRAVENOUS | Status: AC
  Filled 2018-02-20: qty 2

## 2018-02-20 MED ORDER — SODIUM CHLORIDE 0.9 % IV SOLN
INTRAVENOUS | Status: DC | PRN
  Administered 2018-02-20: 250 mL via INTRAVENOUS
  Administered 2018-02-23: 08:00:00 via INTRAVENOUS

## 2018-02-20 NOTE — Progress Notes (Signed)
   Wound evaluated while patient on dialysis.  There is no further bleeding.  Continue Santyl for enzymatic debridement with operative intervention only if wound does not progress in the positive direction.  I am available this weekend as needed.  Rita Prom C. Donzetta Matters, MD [facility] Office: [phone] Pager: [phone]

## 2018-02-20 NOTE — Consult Note (Signed)
[facility] Nurse wound consult note Reason for Consult: Orders needed for the care of two full thickness wounds, one on left flank and one on back Wound type: infectious (cysts drained >2 months ago) vs calciphylaxis Pressure Injury POA: N/A Measurement: Left flank: 0.5cm x 3cm x 5cm with clean red wound bed, moderate amount serous drainage Back: 2cm x 6cm x 4cm with surface of wound covered with grey soft eschar with linear crevice at edge. Probed depth of linear crevice is 4cm with moderate amount thick exudate on cotton tipped applicator noted. Wound bed: As described above Drainage (amount, consistency, odor) As described above Periwound: Intact, dry Dressing procedure/placement/frequency: I have provided orders consistent with what the patient and his wife have been doing at home for the left flank wound (i.e., NS), but have added collagenase enzymatic debriding agent to the back wound as the skin level tissue is necrotic.  Additionally, we will fill the depth with a saline moistened gauze to obliterate dead space and perform twice daily. Recommend consult with surgery for the back wound. If you agree, please order, consult.  [place] nursing team will not follow, but will remain available to this patient, the nursing and medical teams.  Please re-consult if needed. Thanks, Maudie Flakes, MSN, RN, [place], Arther Abbott  Pager# [phone]

## 2018-02-20 NOTE — Progress Notes (Signed)
Patient able to be accessed this AM for HD.   Discussed with Nephrology.  No current needs in IR.  Procedure cancelled today.   Brynda Greathouse, MS RD PA-C

## 2018-02-20 NOTE — Progress Notes (Signed)
Patient is colonized with non toxigenic C. difficile. According to lab note, may not need treatment unless significant symptoms are present. [place] precautions set up. MD notified.

## 2018-02-20 NOTE — Progress Notes (Signed)
Family Medicine Teaching Service Daily Progress Note Intern Pager: [phone]  Patient name: Adam [name]. Medical record number: [id number] Date of birth: 11-06-1960 Age: 57 y.o. Gender: male  Primary Care Provider: [name], MD Consultants: Neurology, Vascular Surgery Code Status: Full  Pt Overview and Major Events to Date:  10/10: Admit for hypotension meeting SIRS, received HD 10/11: Rapid called for minimal bleeding from left groin with hemostasis achieved with manual pressure, received HD, spiked fever while on vancomycin and ceftriaxone, switched to cefepime with vancomycin, GI panel ordered with negative C. difficile toxin and positive antigen 10/12: Received HD  Assessment and Plan: Carles Florea Devan Jr.is a 57 y.o.malepresenting with bleeding from L groin wound. PMH is significant forESRD on dialysis MWF, paroxymal atrial fibrillationon eliquis, pulmonary hypertension, aortic stenosis, s/p thyroidectomy in 2011, and calciphylaxis.  SIRS Criteria: Acute.  Source still yet to be identified.  Appeared to be febrile 10/11 despite vancomycin and ceftriaxone use.  Now transitioned to vancomycin with cefepime.  There was report for possible concern for C. difficile however PCR was negative for toxin and patient's stool frequency is not consistent with C. Difficile.  Blood culture is negative to date and lungs appear to be clear.  Left groin site without signs of infection despite mild bleeding on 10/11. - Follow-up blood culture for final results - GI panel pending - Continue day 3 of vancomycin and day 2 of cefepime, s/p CTX 2 days  Left Groin Wound:  Chronic.  Stable without active bleeding or signs of infection.  Has been evaluated by vascular surgery on 10/11 who deemed bleed as superficial in nature. - Continue monitoring for signs of bleeding - Continue dressings and enzymatic debridement with Santyl considerations of additional debridement in the OR if fails to  improve - Vascular surgery consulted, appreciate recommendations  ESRD on dialysis  Calciphylaxis  Hypotension: Chronic.  Stable.  Currently receiving day 3 of HD (10/10, 10/11, 10/12).  Was patient's only criteria for SIRS and given his known history of HTN low blood pressure, suspect this may be a coincidence, however patient does have signs of infection with fevers and therefore we will treat appropriately. - Nephrology consulted, appreciate recommendations - Continue Phoslo 1334mg  TID and Cinacalet 90g MWF - Monitoring daily RFP and avoid nephrotoxic agents - Continue home midodrine 10 mg 3 times daily with meals  Afib: Chronic.  Rate controlled.  - Telemetry - Will CTM and hold Eliquis given recent epidodes of bleeding - Restart Eliquis when appropriate  Pulmonary HTN: Chronic.  Likely contributing to SOB on admission, now resolved following HD.  TTE on 10/11 revealed changes to prior in April with moderate to severe reduction in EF at 30-35% with diffuse hypokinesis and G1DD with moderate AV stenosis with mean gradient 26 mmHg, severe RV and moderate RV enlargement along with severe TR and RAE with RVSP at 61 mmHg with dilation of IVC.   - Will consider TEE if blood cx are positive - Continue home midodrine as stated above  Thyroid cancer s/p thyroidectomy: Chronic. Stable TSH.   - Continue Synthroid 300 mcg daily  Anemia: Chronic. Hgb stable. Likely anemia of CKD. Absent bleeding from left groin site since 10/11.  Transfusion threshold 7.0. - Monitoring daily CBC  Hyperlipidemia, chronic: Chronic. Stable. - Continue Lipitor 40mg   FEN/GI:Renal diet Prophylaxis:SCDs  Disposition: Pending correction of malfunctioning LUA Hero AVG along with treatment for fevers of unknown etiology.  Subjective:  Patient states he is feeling better this morning.  He denies history of frequent bowel movements with only 2 "loose stool" yesterday.  Patient states this is "normal for him."   He denies abdominal pain.  He feels better while receiving his HD today and denies shortness of breath, cough, chest pain.  Patient states he is amenable to having IR evaluate his malfunctioned AVG.  Objective: Temp:  [97.4 F (36.3 C)-102.7 F (39.3 C)] 98.1 F (36.7 C) (10/12 0727) Pulse Rate:  [76-78] 78 (10/12 0800) Resp:  [16-27] 16 (10/12 0727) BP: (75-98)/(47-68) 90/47 (10/12 0800) SpO2:  [9 %-95 %] 9 % (10/12 0750) Weight:  [125.2 kg] 125.2 kg (10/12 0727) Physical Exam: General: male lying in bed comfortably receiving dialysis with non-toxic appearance HEENT: normocephalic, atraumatic, moist mucous membranes Neck: supple, non-tender without lymphadenopathy Cardiovascular: regular rate and rhythm with 4/6 systolic murmur with without rubs, or gallops, bruit present on LUA Hero AVG Lungs: clear to auscultation bilaterally with normal work of breathing on room air Abdomen: obese, soft, non-tender, non-distended, normoactive bowel sounds Skin: warm, dry, no rashes, cap refill < 2 seconds Extremities: warm and well perfused, normal tone, 2+ bilateral pitting edema  Laboratory: Recent Labs  Lab 02/18/18 0728 02/18/18 0838 02/19/18 0326 02/20/18 0328  WBC 8.2  --  6.4 8.1  HGB 8.5* 10.5* 7.9* 8.5*  HCT 27.5* 31.0* 25.7* 28.5*  PLT 192  --  145* 121*   Recent Labs  Lab 02/18/18 0728 02/18/18 0838 02/19/18 0326 02/20/18 0328  NA 137 138 138 133*  K 2.9* 2.7* 3.1* 3.6  CL 94* 98 97* 97*  CO2 30  --  29 23  BUN 16 17 13 17   CREATININE 4.35* 4.10* 3.41* 4.64*  CALCIUM 9.4  --  8.5* 8.6*  PROT 6.0*  --   --   --   BILITOT 0.4  --   --   --   ALKPHOS 208*  --   --   --   ALT 12  --   --   --   AST 34  --   --   --   GLUCOSE 75 67* 72 110*   C. difficile PCR: Negative C. difficile PCR reflex: Positive antigen, negative toxin GI panel PCR: Pending TSH: 3.781 (WNL) I-STAT lactic acid: 1.66 < 1.93 Blood culture x2: No growth 1 day D-dimer: 2.06  Imaging/Diagnostic  Tests: TRANSTHORACIC ECHOCARDIOGRAM (02/19/2018) Impressions: - Compared to a prior study in 08/2017, the LVEF is lower at 30-35%   with severe global hypokinesis, grade 1 DD and elevated LV   filling pressure. There is severe RV enlargement with moderate RV   Systolic dysfunction, severe TR and severe RAE, RVSP is 61 mmHg,   the IVC is dilated. The aortic valve is calcified with moderate   stenosis - mean gradient of 26 mmHg and AVA around 1.2 cm2.  EKG 12-lead: NSR, probable left atrial enlargement, QTC 612  PORTABLE CHEST 1 VIEW (02/18/2018) IMPRESSION: Enlargement of cardiac silhouette with pulmonary vascular congestion and minimal chronic failure. No new infiltrates identified.  CT ANGIOGRAPHY CHEST WITH CONTRAST (02/16/2018) IMPRESSION: 1. No evidence of pulmonary embolism. 2. Findings in the lungs suggests mild interstitial pulmonary edema. Given the small right pleural effusion and enlarged heart, findings are favored to reflect congestive heart failure. 3. Cardiomegaly with right atrial dilatation. 4. Small pericardial effusion most evident over in the right atrium, unlikely to be of any hemodynamic significance at this time. 5. There are calcifications of the aortic valve and mitral annulus. Echocardiographic correlation for  evaluation of potential valvular dysfunction may be warranted if clinically indicated. 6. Aortic atherosclerosis, in addition to left main and 3 vessel coronary artery disease. Please note that although the presence of coronary artery calcium documents the presence of coronary artery disease, the severity of this disease and any potential stenosis cannot be assessed on this non-gated CT examination. Assessment for potential risk factor modification, dietary therapy or pharmacologic therapy may be warranted, if clinically indicated.    Clifton, Safley, DO 02/20/2018, 8:46 AM PGY-3, [place] Intern pager: [phone], text pages  welcome

## 2018-02-20 NOTE — Progress Notes (Signed)
Subjective:  On HD, 3L off today  Objective Vital signs in last 24 hours: Vitals:   02/20/18 1100 02/20/18 1130 02/20/18 1150 02/20/18 1256  BP: (!) 101/50 125/75 98/65 (!) 94/53  Pulse: 74 76 75 80  Resp:   16 16  Temp:   98.5 F (36.9 C) 97.7 F (36.5 C)  TempSrc:   Oral Oral  SpO2:   98% 98%  Weight:   121.9 kg   Height:       Weight change:   Physical Exam: General: alert, OX3, ,NAD , Chrnically ill appearing  Heart: RRR 1/6 sem , no rug or gallop Lungs: CTA  , unlabored  Abdomen: Bs pos , NT,ND  Extremities: 2+ LLE edema , L groin wound stable  No bleeding / RLE  1+/ L lat. Chest wound , R flank wound dressing  Dialysis Access: pos bruit LUA  Hero AVG    Home meds:  - ambirsentan 10 qd/ amiodarone 200 qd/ eliquis 2.5 bid/ midodrine 10 tid  - tadalifil 20 qd  - T4/ lipitor/ sensipar/ MVI/ ultram   OP Dialysis: MWF   [facility]   4h  122kg  Heparin none  LUE HeRO  450/800, 3K/2.25Ca  -Hectorol 85mcg qHD -venofer 50mg  qwk -Mircera 248mcg IV q2wks - last 10/7   Problem/Plan: 1.  Bleeding L thigh wound - Dr Carlis Abbott eval./ managing ,  wound  from prior sinus tract surgery. Better.  2. Vol overload - +fluid by CXR/ CT. HD today w/ 3L net negative, down to dry wt now. Cutoff BP's will be in 70's for this patient w/ severe pulm HTN. Continue Midodrine  10 mg tid  3. ESRD on HD MWF - able to cannulate L UA Hero AVG today, will plan shuntogram at a later date. HD today off schedule.   4. Pulm HTN 5. Chronic hypotension - cont midodrine 6.   AFib - on eliquis, amio 7. R & L chest wounds - per primary 8. Anemia ckd - Hb 8.5- 10.5 > 7.9 , ESA recently dosed 10/07 , follow trends.  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  10. Dispo - stable for dc from renal standpoint   Kelly Splinter MD [facility] pager [phone]   02/20/2018, 1:32  PM    Labs: Basic Metabolic Panel: Recent Labs  Lab 02/18/18 0728 02/18/18 0838 02/19/18 0326 02/20/18 0328  NA 137 138 138 133*  K 2.9* 2.7* 3.1* 3.6  CL 94* 98 97* 97*  CO2 30  --  29 23  GLUCOSE 75 67* 72 110*  BUN 16 17 13 17   CREATININE 4.35* 4.10* 3.41* 4.64*  CALCIUM 9.4  --  8.5* 8.6*  PHOS  --   --  2.1* 2.8   Liver Function Tests: Recent Labs  Lab 02/18/18 0728 02/19/18 0326 02/20/18 0328  AST 34  --   --   ALT 12  --   --   ALKPHOS 208*  --   --   BILITOT 0.4  --   --   PROT 6.0*  --   --   ALBUMIN 2.6* 2.2* 2.4*   No results for input(s): LIPASE, AMYLASE in the last 168 hours. No results for input(s): AMMONIA in the last 168 hours. CBC: Recent Labs  Lab 02/18/18 0728 02/18/18 0838 02/19/18 0326 02/20/18 0328  WBC 8.2  --  6.4 8.1  NEUTROABS 7.1  --   --  8.0*  HGB 8.5* 10.5* 7.9* 8.5*  HCT 27.5* 31.0* 25.7* 28.5*  MCV 89.6  --  89.9 91.6  PLT 192  --  145* 121*   Cardiac Enzymes: No results for input(s): CKTOTAL, CKMB, CKMBINDEX, TROPONINI in the last 168 hours. CBG: Recent Labs  Lab 02/19/18 2040 02/19/18 2132 02/20/18 0049 02/20/18 0502 02/20/18 1259  GLUCAP 70 88 102* 114* 95    Studies/Results: No results found. Medications: . ceFEPime (MAXIPIME) IV 1 g (02/19/18 2026)   . atorvastatin  40 mg Oral QPM  . calcium acetate  1,334 mg Oral TID WC  . Chlorhexidine Gluconate Cloth  6 each Topical Q0600  . Chlorhexidine Gluconate Cloth  6 each Topical Q0600  . cinacalcet  90 mg Oral Q M,W,F-1800  . collagenase   Topical Daily  . diphenhydrAMINE  50 mg Oral Once  . doxercalciferol  1 mcg Intravenous Q M,W,F-HD  . feeding supplement (GLUCERNA SHAKE)  237 mL Oral BID WC  . feeding supplement (PRO-STAT SUGAR FREE 64)  30 mL Oral BID  . levothyroxine  300 mcg Oral QAC breakfast  . midodrine  10 mg Oral TID WC  . predniSONE  50 mg Oral Once

## 2018-02-21 LAB — CBC WITH DIFFERENTIAL/PLATELET
BASOS PCT: 0 %
Basophils Absolute: 0 10*3/uL (ref 0.0–0.1)
EOS ABS: 0 10*3/uL (ref 0.0–0.5)
EOS PCT: 0 %
HCT: 26.5 % — ABNORMAL LOW (ref 39.0–52.0)
HEMOGLOBIN: 8.2 g/dL — AB (ref 13.0–17.0)
LYMPHS ABS: 0 10*3/uL — AB (ref 0.7–4.0)
Lymphocytes Relative: 0 %
MCH: 28.2 pg (ref 26.0–34.0)
MCHC: 30.9 g/dL (ref 30.0–36.0)
MCV: 91.1 fL (ref 80.0–100.0)
Monocytes Absolute: 0.3 10*3/uL (ref 0.1–1.0)
Monocytes Relative: 4 %
NEUTROS PCT: 96 %
NRBC: 2 /100{WBCs} — AB
Neutro Abs: 7.6 10*3/uL (ref 1.7–7.7)
Platelets: 146 10*3/uL — ABNORMAL LOW (ref 150–400)
RBC: 2.91 MIL/uL — ABNORMAL LOW (ref 4.22–5.81)
RDW: 22.5 % — AB (ref 11.5–15.5)
WBC: 7.9 10*3/uL (ref 4.0–10.5)
nRBC: 0 % (ref 0.0–0.2)

## 2018-02-21 LAB — RENAL FUNCTION PANEL
ALBUMIN: 2.2 g/dL — AB (ref 3.5–5.0)
Anion gap: 11 (ref 5–15)
BUN: 20 mg/dL (ref 6–20)
CALCIUM: 9 mg/dL (ref 8.9–10.3)
CO2: 26 mmol/L (ref 22–32)
CREATININE: 3.99 mg/dL — AB (ref 0.61–1.24)
Chloride: 98 mmol/L (ref 98–111)
GFR calc Af Amer: 18 mL/min — ABNORMAL LOW (ref >60)
GFR, EST NON AFRICAN AMERICAN: 16 mL/min — AB (ref >60)
Glucose, Bld: 139 mg/dL — ABNORMAL HIGH (ref 70–99)
PHOSPHORUS: 2.9 mg/dL (ref 2.5–4.6)
Potassium: 3.7 mmol/L (ref 3.5–5.1)
SODIUM: 135 mmol/L (ref 135–145)

## 2018-02-21 LAB — GLUCOSE, CAPILLARY
GLUCOSE-CAPILLARY: 145 mg/dL — AB (ref 70–99)
GLUCOSE-CAPILLARY: 130 mg/dL — AB (ref 70–99)
GLUCOSE-CAPILLARY: 103 mg/dL — AB (ref 70–99)
Glucose-Capillary: 120 mg/dL — ABNORMAL HIGH (ref 70–99)
Glucose-Capillary: 121 mg/dL — ABNORMAL HIGH (ref 70–99)
Glucose-Capillary: 124 mg/dL — ABNORMAL HIGH (ref 70–99)

## 2018-02-21 MED ORDER — TADALAFIL 20 MG PO TABS
20.0000 mg | ORAL_TABLET | Freq: Every day | ORAL | Status: DC
  Administered 2018-02-21 – 2018-02-25 (×5): 20 mg via ORAL
  Filled 2018-02-21 (×6): qty 1

## 2018-02-21 MED ORDER — AMBRISENTAN 5 MG PO TABS
10.0000 mg | ORAL_TABLET | Freq: Every day | ORAL | Status: DC
  Administered 2018-02-21 – 2018-02-25 (×5): 10 mg via ORAL
  Filled 2018-02-21 (×6): qty 2

## 2018-02-21 NOTE — Progress Notes (Addendum)
Family Medicine Teaching Service Daily Progress Note Intern Pager: [phone]  Patient name: Adam [name]. Medical record number: [id number] Date of birth: 23-Aug-1960 Age: 57 y.o. Gender: male  Primary Care Provider: [name], MD Consultants: Neurology, Vascular Surgery Code Status: Full  Pt Overview and Major Events to Date:  10/10: Admit for hypotension meeting SIRS, received HD 10/11: Rapid called for minimal bleeding from left groin with hemostasis achieved with manual pressure, received HD, spiked fever while on vancomycin and ceftriaxone, switched to cefepime with vancomycin, GI panel ordered with negative C. difficile toxin and positive antigen 10/12: Received HD  Assessment and Plan: Adam [name].is a 57 y.o.malepresenting with bleeding from L groin wound. PMH is significant forESRD on dialysis MWF, paroxymal atrial fibrillationon eliquis, pulmonary hypertension, aortic stenosis, s/p thyroidectomy in 2011, and calciphylaxis.  1 out of 4 SIRS Criteria Hypotension is chronic for patient and only positive SIRS on admission. Did spike fever x2 on 10/11. Switched from ctx to cefepime along with vanc and afebrile since. Blood cx neg 3 days. Has a right buttock wound with exudative drainage. Surrounding area is indurated and hyperpigmented compared to rest of skin. Will ask surgery to evaluate for possible surgical debridement. If debridement not necessary to likely d/c vanc, and switch cefepime to oral. Left groin area that was suspicious evaluated by VVS who felt that it was not infected. GI panel negative. Colonized with cdiff, but non-toxigenic.  - vital signs per floor routine - follow up blood cultures - surgical eval of right buttock/back wound - stop vancomycin - continue cefepime   Right Buttock wound Had cyst excision 2 months ago. Slow healing wound. Initially wound was closed but has opened up since admission and is now draining. Foul smelling  exudative discharge from wound. Would likely benefit from surgery consult for possible surgical debridement. - General surgery consult - appreciate wound care recs - continue cefepime  Left Groin Wound:   No active bleeding since am 10/9. Patient has been bed ridden since then. Will give walking trial today. If not active bleeding problem resolved. VVS has evaluated and do not feel infection is present. - ambulate this am - monitor for s/s of bleeding - Continue dressings and enzymatic debridement with Santyl considerations of additional debridement in the OR if fails to improve - Vascular surgery consulted, appreciate recommendations  ESRD on dialysis  Calciphylaxis  Hypotension: MWF dialyzer. Known chronic hypotension 2/2 dialysis. Renal following appreciate their recs, ok for dc from their standpoint. - nephrology following, appreciate recs - home midodrine 10mg  tid - phoslo 1334mg  tid and cinacalcet 90g mwf - daily rfp  Afib: Chronic.  Rate controlled.  - Telemetry - Will CTM and hold Eliquis given recent epidodes of bleeding - Restart Eliquis when appropriate  Pulmonary HTN:  Likely contributing to SOB on admission, now resolved following HD.  TTE on 10/11 revealed changes to prior in April with moderate to severe reduction in EF at 30-35% with diffuse hypokinesis and G1DD with moderate AV stenosis with mean gradient 26 mmHg, severe RV and moderate RV enlargement along with severe TR and RAE with RVSP at 61 mmHg with dilation of IVC.   - restart cialis and ambrisentan - Continue home midodrine as stated above  Thyroid cancer s/p thyroidectomy: Chronic. Stable TSH.   - Continue Synthroid 300 mcg daily  Anemia: Chronic. Hgb stable. Likely anemia of CKD. Absent bleeding from left groin site since 10/11.  Transfusion threshold 7.0. - Monitoring daily CBC  Hyperlipidemia, chronic: Chronic. Stable. - Continue Lipitor 40mg   FEN/GI:Renal  diet Prophylaxis:SCDs  Disposition: likely home  Subjective:  Feeling better this am with no complaints. Some back pain from right buttock wound.  Objective: Temp:  [97.7 F (36.5 C)-98.6 F (37 C)] 98.2 F (36.8 C) (10/13 0414) Pulse Rate:  [75-83] 78 (10/13 0414) Resp:  [14-17] 14 (10/13 0422) BP: (79-125)/(44-75) 90/62 (10/13 0422) SpO2:  [91 %-98 %] 91 % (10/13 0414) Weight:  [121.9 kg] 121.9 kg (10/12 1150) Physical Exam: General: Middle ages [place] male lying comfortably in bed Cardiovascular: rrr w/ 4/6 systolic murmur w/o m/r/g. Thrill present LUA HeRO graft. Lungs: CTAB w/o increased work of breathing  Abdomen: obese, soft, non-tender, non-distended, normoactive bowel sounds Skin: warm, dry, no rashes, cap refill < 2 seconds Extremities: warm and well perfused, normal tone, 2+ bilateral pitting edema       Laboratory: Recent Labs  Lab 02/19/18 0326 02/20/18 0328 02/21/18 0428  WBC 6.4 8.1 7.9  HGB 7.9* 8.5* 8.2*  HCT 25.7* 28.5* 26.5*  PLT 145* 121* 146*   Recent Labs  Lab 02/18/18 0728  02/19/18 0326 02/20/18 0328 02/21/18 0428  NA 137   < > 138 133* 135  K 2.9*   < > 3.1* 3.6 3.7  CL 94*   < > 97* 97* 98  CO2 30  --  29 23 26   BUN 16   < > 13 17 20   CREATININE 4.35*   < > 3.41* 4.64* 3.99*  CALCIUM 9.4  --  8.5* 8.6* 9.0  PROT 6.0*  --   --   --   --   BILITOT 0.4  --   --   --   --   ALKPHOS 208*  --   --   --   --   ALT 12  --   --   --   --   AST 34  --   --   --   --   GLUCOSE 75   < > 72 110* 139*   < > = values in this interval not displayed.   Imaging/Diagnostic Tests: none   Guadalupe Dawn, MD 02/21/2018, 11:06 AM PGY-2, [facility] Intern pager: [phone], text pages welcome

## 2018-02-21 NOTE — Consult Note (Signed)
Reason for Consult:R flank wound Referring Physician: Ricky Stabs. is an 57 y.o. male.  HPI: I was asked to see Adam [name] in consultation regarding a R flank wound.  He has had a right flank wound and left flank wound for many weeks.  His wife has been helping him with dressing changes at home.  They note that the left flank wound has been clean although it drains a lot of clear fluid.  He was admitted 10/10 with sepsis type picture and was treated with broad-spectrum antibiotics intravenously.  He was thought to have a left thigh abscess process at previous dialysis tunneled catheter access site but this was evaluated by Dr. Carlis Abbott from vascular surgery and it was not felt to be an ongoing source of infection.  Wound ostomy nurse evaluated his left flank wound and right flank wound today.  She noted the left flank wound to be clean but noted the right flank wound to have some necrotic tissue and purulent drainage.  Patient reports acute discomfort from both areas.  He is currently eating lunch.  Past Medical History:  Diagnosis Date  . Abscess 02/2018   LEFT THIGH   . AICD (automatic cardioverter/defibrillator) present    [employer] - s/p extraction 08/21/17 due to staph infection  . Anemia   . Aortic stenosis    moderate AS by 08/2016 echo  . Arthritis    "knees" (08/05/2016)  . Atrial fibrillation ([place])   . ESRD (end stage renal disease) on dialysis ([place])    "M, T, W, T, F; NX stage hemodialysis; I do it at home" (08/05/2016)  . 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 History of blood transfusion 2009   "S/P biopsy for prostate cancer check"  . Hypothyroidism (acquired)   . Mitral stenosis    moderate mitral stenosis  . Nonhealing surgical wound    left thigh  . Nonischemic cardiomyopathy ([place])   . Pneumonia 2014; 08/05/2016  . Pulmonary HTN ([place])   . Renal insufficiency   . Thyroid cancer ([place]) 2011    Past Surgical History:   Procedure Laterality Date  . ARTERIAL LINE INSERTION Right 08/24/2017   Procedure: ARTERIAL LINE INSERTION INTO RIGHT FEMORAL ARTERY;  Surgeon: Conrad Yoe, MD;  Location: [place];  Service: Vascular;  Laterality: Right;  . BASCILIC VEIN TRANSPOSITION Right 10/13/2017   Procedure: FIRST STAGE BRACHIAL VEIN TRANSPOSITION RIGHT ARM;  Surgeon: Conrad Ken[place]ville, MD;  Location: [place];  Service: Vascular;  Laterality: Right;  . BIOPSY  09/25/2017   Procedure: BIOPSY;  Surgeon: Ronnette Juniper, MD;  Location: [facility] ENDOSCOPY;  Service: Gastroenterology;;  . CARDIAC CATHETERIZATION Right 2017  . CARDIAC DEFIBRILLATOR PLACEMENT  2009  . DIALYSIS FISTULA CREATION Left 2003   "upper arm"  . DIALYSIS/PERMA CATHETER REMOVAL Left 09/01/2017   Procedure: DIALYSIS/PERMA CATHETER REMOVAL;  Surgeon: Conrad Coop[place]ville, MD;  Location: [place];  Service: Vascular;  Laterality: Left;  . ESOPHAGOGASTRODUODENOSCOPY (EGD) WITH PROPOFOL N/A 09/25/2017   Procedure: ESOPHAGOGASTRODUODENOSCOPY (EGD) WITH PROPOFOL;  Surgeon: Ronnette Juniper, MD;  Location: [place];  Service: Gastroenterology;  Laterality: N/A;  . HERNIA REPAIR    . I&D EXTREMITY Right 11/06/2017   Procedure: IRRIGATION AND DEBRIDEMENT KNEE;  Surgeon: Rod Can, MD;  Location: [place];  Service: Orthopedics;  Laterality: Right;  . I&D EXTREMITY Left 01/03/2018   Procedure: IRRIGATION AND DEBRIDEMENT OF THIGH, wound vac application  wound size 10cm x  2.5cm x 2.5cm;  Surgeon: Waynetta Sandy, MD;  Location: [place];  Service: Vascular;  Laterality: Left;  . ICD LEAD REMOVAL Right 08/20/2017   Procedure: ICD EXTRACTION WITH C-ARM;  Surgeon: Evans Lance, MD;  Location: [facility] OR;  Service: Cardiovascular;  Laterality: Right;  DR VAN TRIGT TO BACK-UP  . icd removed    . INSERTION OF DIALYSIS CATHETER N/A 11/11/2016   Procedure: INSERTION OF TUNNELED DIALYSIS CATHETER;  Surgeon: Angelia Mould, MD;  Location: [place];  Service: Vascular;  Laterality: N/A;  .  INSERTION OF DIALYSIS CATHETER Left 08/24/2017   Procedure: INSERTION OF DIALYSIS CATHETER;  Surgeon: Conrad [place], MD;  Location: [place];  Service: Vascular;  Laterality: Left;  . INSERTION OF DIALYSIS CATHETER Left 09/01/2017   Procedure: INSERTION OF DIALYSIS CATHETER;  Surgeon: Conrad Hallock, MD;  Location: [place];  Service: Vascular;  Laterality: Left;  . KIDNEY TRANSPLANT  2009  . KNEE SURGERY Bilateral 2000s   "drained fluid"  . LAPAROSCOPIC GASTRIC BANDING  2006  . LIGATION OF ARTERIOVENOUS  FISTULA Right 11/10/2017   Procedure: LIGATION OF BRACHIAL VEIN TRANSPOSITION RIGHT ARM;  Surgeon: Angelia Mould, MD;  Location: [place];  Service: Vascular;  Laterality: Right;  . MASS EXCISION N/A 01/12/2018   Procedure: EXCISION OF MULTIPLE SUBCUTANEOUS MASSES;  Surgeon: Ralene Ok, MD;  Location: [place];  Service: General;  Laterality: N/A;  . PROSTATE BIOPSY  2009  . REVISON OF ARTERIOVENOUS FISTULA Left 3/38/2505   Procedure: PLICATION OF ARTERIOVENOUS FISTULA ANEURYSM;  Surgeon: Elam Dutch, MD;  Location: [place];  Service: Vascular;  Laterality: Left;  . REVISON OF ARTERIOVENOUS FISTULA Left 11/10/2016   Procedure: REVISON OF ARTERIOVENOUS FISTULA - LEFT UPPER ARM;  Surgeon: Elam Dutch, MD;  Location: [place];  Service: Vascular;  Laterality: Left;  . REVISON OF ARTERIOVENOUS FISTULA Left 08/24/2017   Procedure: REVISON OF ARTERIOVENOUS FISTULA LEFT;  Surgeon: Conrad Palmer, MD;  Location: [place];  Service: Vascular;  Laterality: Left;  . TEE WITHOUT CARDIOVERSION N/A 08/20/2017   Procedure: TRANSESOPHAGEAL ECHOCARDIOGRAM (TEE);  Surgeon: Evans Lance, MD;  Location: [place];  Service: Cardiovascular;  Laterality: N/A;  . THROMBECTOMY W/ EMBOLECTOMY Left 12/25/2014   Procedure: THROMBECTOMY ARTERIOVENOUS FISTULA;  Surgeon: Elam Dutch, MD;  Location: [place];  Service: Vascular;  Laterality: Left;  . THYROIDECTOMY  2011  . TOE AMPUTATION Bilateral     right 1st and  2nd  digits; left 1st and 3rd digits"  . UMBILICAL HERNIA REPAIR  X 2  . UPPER EXTREMITY VENOGRAPHY Right 11/05/2017   Procedure: UPPER EXTREMITY VENOGRAPHY;  Surgeon: Conrad [place]ie[place], MD;  Location: [place] [facility];  Service: Cardiovascular;  Laterality: Right;  Marland Kitchen VASCULAR ACCESS DEVICE INSERTION Left 11/10/2017   Procedure: INSERTION OF SUPER HERO VASCULAR ACCESS DEVICE LEFT ARM;  Surgeon: Angelia Mould, MD;  Location: [place];  Service: Vascular;  Laterality: Left;  Marland Kitchen VEIN HARVEST Left 08/24/2017   Procedure: VEIN HARVEST;  Surgeon: Conrad [place], MD;  Location: [place];  Service: Vascular;  Laterality: Left;  . WOUND DEBRIDEMENT Left 12/22/2017   Procedure: EXCISION OF LEFT THIGH WOUND;  Surgeon: Elam Dutch, MD;  Location: [place];  Service: Vascular;  Laterality: Left;  . WOUND DEBRIDEMENT Left 01/28/2018   Procedure: DEBRIDEMENT WOUND LEFT THIGH;  Surgeon: Marty Heck, MD;  Location: [place];  Service: Vascular;  Laterality: Left;    Family History  Problem  Relation Age of Onset  . Heart failure Mother   . Hypertension Mother   . CAD Mother 105  . Emphysema Mother   . Hypertension Father   . Kidney failure Father     Social History:  reports that he has never smoked. He has never used smokeless tobacco. He reports that he drinks alcohol. He reports that he does not use drugs.  Allergies:  Allergies  Allergen Reactions  . Enalapril Swelling and Other (See Comments)    MOUTH SWELLING EDEMA  . Iodinated Diagnostic Agents Nausea And Vomiting and Nausea Only    Medications: I have reviewed the patient's current medications.  Results for orders placed or performed during the hospital encounter of 02/18/18 (from the past 48 hour(s))  Glucose, capillary     Status: None   Collection Time: 02/19/18  2:36 PM  Result Value Ref Range   Glucose-Capillary 99 70 - 99 mg/dL  Glucose, capillary     Status: None   Collection Time: 02/19/18  5:43 PM  Result Value Ref Range    Glucose-Capillary 93 70 - 99 mg/dL  Glucose, capillary     Status: None   Collection Time: 02/19/18  8:40 PM  Result Value Ref Range   Glucose-Capillary 70 70 - 99 mg/dL  Glucose, capillary     Status: None   Collection Time: 02/19/18  9:32 PM  Result Value Ref Range   Glucose-Capillary 88 70 - 99 mg/dL  Glucose, capillary     Status: Abnormal   Collection Time: 02/20/18 12:49 AM  Result Value Ref Range   Glucose-Capillary 102 (H) 70 - 99 mg/dL  C difficile quick scan w PCR reflex     Status: Abnormal   Collection Time: 02/20/18  2:20 AM  Result Value Ref Range   C Diff antigen POSITIVE (A) NEGATIVE   C Diff toxin NEGATIVE NEGATIVE   C Diff interpretation Results are indeterminate. See PCR results.     Comment: Performed at [facility], [place] [address], [place], [place] [address]  Gastrointestinal Panel by PCR , Stool     Status: None   Collection Time: 02/20/18  2:20 AM  Result Value Ref Range   Campylobacter species NOT DETECTED NOT DETECTED   Plesimonas shigelloides NOT DETECTED NOT DETECTED   Salmonella species NOT DETECTED NOT DETECTED   Yersinia enterocolitica NOT DETECTED NOT DETECTED   Vibrio species NOT DETECTED NOT DETECTED   Vibrio cholerae NOT DETECTED NOT DETECTED   Enteroaggregative E coli (EAEC) NOT DETECTED NOT DETECTED   Enteropathogenic E coli (EPEC) NOT DETECTED NOT DETECTED   Enterotoxigenic E coli (ETEC) NOT DETECTED NOT DETECTED   Shiga like toxin producing E coli (STEC) NOT DETECTED NOT DETECTED   E. coli O157 NOT DETECTED NOT DETECTED   Shigella/Enteroinvasive E coli (EIEC) NOT DETECTED NOT DETECTED   Cryptosporidium NOT DETECTED NOT DETECTED   Cyclospora cayetanensis NOT DETECTED NOT DETECTED   Entamoeba histolytica NOT DETECTED NOT DETECTED   Giardia lamblia NOT DETECTED NOT DETECTED   Adenovirus F40/41 NOT DETECTED NOT DETECTED   Astrovirus NOT DETECTED NOT DETECTED   Norovirus GI/GII NOT DETECTED NOT DETECTED   Rotavirus A NOT DETECTED NOT  DETECTED   Sapovirus (I, II, IV, and V) NOT DETECTED NOT DETECTED    Comment: Performed at [facility], [address] [place], [place] [address]  C. Diff by PCR, Reflexed     Status: None   Collection Time: 02/20/18  2:20 AM  Result  Value Ref Range   Toxigenic C. Difficile by PCR NEGATIVE NEGATIVE    Comment: Patient is colonized with non toxigenic C. difficile. May not need treatment unless significant symptoms are present. Performed at [facility], [place] [address], [place], [place] [address]   CBC with Differential/Platelet     Status: Abnormal   Collection Time: 02/20/18  3:28 AM  Result Value Ref Range   WBC 8.1 4.0 - 10.5 K/uL   RBC 3.11 (L) 4.22 - 5.81 MIL/uL   Hemoglobin 8.5 (L) 13.0 - 17.0 g/dL   HCT 28.5 (L) 39.0 - 52.0 %   MCV 91.6 80.0 - 100.0 fL   MCH 27.3 26.0 - 34.0 pg   MCHC 29.8 (L) 30.0 - 36.0 g/dL   RDW 22.3 (H) 11.5 - 15.5 %   Platelets 121 (L) 150 - 400 K/uL   nRBC 0.2 0.0 - 0.2 %   Neutrophils Relative % 99 %   Neutro Abs 8.0 (H) 1.7 - 7.7 K/uL   Lymphocytes Relative 1 %   Lymphs Abs 0.1 (L) 0.7 - 4.0 K/uL   Monocytes Relative 0 %   Monocytes Absolute 0.0 (L) 0.1 - 1.0 K/uL   Eosinophils Relative 0 %   Eosinophils Absolute 0.0 0.0 - 0.5 K/uL   Basophils Relative 0 %   Basophils Absolute 0.0 0.0 - 0.1 K/uL   nRBC 2 (H) 0 /100 WBC   Burr Cells PRESENT    Polychromasia PRESENT    Target Cells PRESENT     Comment: Performed at [facility], [place] [address] [place],  [address]  Renal function panel     Status: Abnormal   Collection Time: 02/20/18  3:28 AM  Result Value Ref Range   Sodium 133 (L) 135 - 145 mmol/L   Potassium 3.6 3.5 - 5.1 mmol/L   Chloride 97 (L) 98 - 111 mmol/L   CO2 23 22 - 32 mmol/L   Glucose, Bld 110 (H) 70 - 99 mg/dL   BUN 17 6 - 20 mg/dL   Creatinine, Ser 4.64 (H) 0.61 - 1.24 mg/dL   Calcium 8.6 (L) 8.9 - 10.3 mg/dL   Phosphorus 2.8 2.5 - 4.6 mg/dL   Albumin 2.4 (L) 3.5 - 5.0 g/dL   GFR calc non  Af Amer 13 (L) >60 mL/min   GFR calc Af Amer 15 (L) >60 mL/min    Comment: (NOTE) The eGFR has been calculated using the CKD EPI equation. This calculation has not been validated in all clinical situations. eGFR's persistently <60 mL/min signify possible Chronic Kidney Disease.    Anion gap 13 5 - 15    Comment: Performed at [facility] [address] [place], [place] [address]  Glucose, capillary     Status: Abnormal   Collection Time: 02/20/18  5:02 AM  Result Value Ref Range   Glucose-Capillary 114 (H) 70 - 99 mg/dL  Glucose, capillary     Status: None   Collection Time: 02/20/18 12:59 PM  Result Value Ref Range   Glucose-Capillary 95 70 - 99 mg/dL  Glucose, capillary     Status: Abnormal   Collection Time: 02/20/18  5:01 PM  Result Value Ref Range   Glucose-Capillary 140 (H) 70 - 99 mg/dL  Glucose, capillary     Status: Abnormal   Collection Time: 02/20/18  8:18 PM  Result Value Ref Range   Glucose-Capillary 156 (H) 70 - 99 mg/dL  Glucose, capillary     Status:  Abnormal   Collection Time: 02/21/18 12:09 AM  Result Value Ref Range   Glucose-Capillary 120 (H) 70 - 99 mg/dL  Glucose, capillary     Status: Abnormal   Collection Time: 02/21/18  4:17 AM  Result Value Ref Range   Glucose-Capillary 145 (H) 70 - 99 mg/dL  Renal function panel     Status: Abnormal   Collection Time: 02/21/18  4:28 AM  Result Value Ref Range   Sodium 135 135 - 145 mmol/L   Potassium 3.7 3.5 - 5.1 mmol/L   Chloride 98 98 - 111 mmol/L   CO2 26 22 - 32 mmol/L   Glucose, Bld 139 (H) 70 - 99 mg/dL   BUN 20 6 - 20 mg/dL   Creatinine, Ser 3.99 (H) 0.61 - 1.24 mg/dL   Calcium 9.0 8.9 - 10.3 mg/dL   Phosphorus 2.9 2.5 - 4.6 mg/dL   Albumin 2.2 (L) 3.5 - 5.0 g/dL   GFR calc non Af Amer 16 (L) >60 mL/min   GFR calc Af Amer 18 (L) >60 mL/min    Comment: (NOTE) The eGFR has been calculated using the CKD EPI equation. This calculation has not been validated in all clinical situations. eGFR's  persistently <60 mL/min signify possible Chronic Kidney Disease.    Anion gap 11 5 - 15    Comment: Performed at [facility] [address], [place], [place] [address]  CBC with Differential/Platelet     Status: Abnormal   Collection Time: 02/21/18  4:28 AM  Result Value Ref Range   WBC 7.9 4.0 - 10.5 K/uL   RBC 2.91 (L) 4.22 - 5.81 MIL/uL   Hemoglobin 8.2 (L) 13.0 - 17.0 g/dL   HCT 26.5 (L) 39.0 - 52.0 %   MCV 91.1 80.0 - 100.0 fL   MCH 28.2 26.0 - 34.0 pg   MCHC 30.9 30.0 - 36.0 g/dL   RDW 22.5 (H) 11.5 - 15.5 %   Platelets 146 (L) 150 - 400 K/uL   nRBC 0.0 0.0 - 0.2 %   Neutrophils Relative % 96 %   Neutro Abs 7.6 1.7 - 7.7 K/uL   Lymphocytes Relative 0 %   Lymphs Abs 0.0 (L) 0.7 - 4.0 K/uL   Monocytes Relative 4 %   Monocytes Absolute 0.3 0.1 - 1.0 K/uL   Eosinophils Relative 0 %   Eosinophils Absolute 0.0 0.0 - 0.5 K/uL   Basophils Relative 0 %   Basophils Absolute 0.0 0.0 - 0.1 K/uL   nRBC 2 (H) 0 /100 WBC   Burr Cells PRESENT    Polychromasia PRESENT     Comment: Performed at [facility], [place] [address], [place], [place] [address]  Glucose, capillary     Status: Abnormal   Collection Time: 02/21/18  7:59 AM  Result Value Ref Range   Glucose-Capillary 124 (H) 70 - 99 mg/dL  Glucose, capillary     Status: Abnormal   Collection Time: 02/21/18 11:55 AM  Result Value Ref Range   Glucose-Capillary 130 (H) 70 - 99 mg/dL    No results found.  Review of Systems  Constitutional: Negative for chills and fever.  HENT: Negative.   Eyes: Negative for blurred vision.  Respiratory: Negative for cough and shortness of breath.   Cardiovascular: Negative for chest pain.  Gastrointestinal: Negative for abdominal pain, nausea and vomiting.  Genitourinary:       ESRD  Musculoskeletal: Positive for back pain.  Skin:  HPI  Neurological: Negative.   Endo/Heme/Allergies: Negative.   Psychiatric/Behavioral: Negative.    Blood pressure (!) 82/59, pulse 80,  temperature (!) 97.5 F (36.4 C), temperature source Oral, resp. rate 14, height 6' 1" (1.854 m), weight 121.9 kg, SpO2 95 %. Physical Exam  Constitutional: He is oriented to person, place, and time. He appears well-developed and well-nourished.  HENT:  Right Ear: External ear normal.  Left Ear: External ear normal.  Mouth/Throat: Oropharynx is clear and moist.  Eyes: Pupils are equal, round, and reactive to light. EOM are normal.  Neck: No tracheal deviation present. No thyromegaly present.  Cardiovascular: Normal rate, regular rhythm and normal heart sounds.  Respiratory: Effort normal and breath sounds normal. No respiratory distress. He has no wheezes. He has no rales.  GI: Soft. He exhibits no distension. There is no tenderness. There is no rebound and no guarding.  Musculoskeletal:  Left flank wound with packing is clean, right flank wound has necrotic eschar on both edges with an opening in the center and some purulent drainage, some induration in the surrounding region is present, no cellulitis  Neurological: He is alert and oriented to person, place, and time.  Psychiatric: He has a normal mood and affect.    Assessment/Plan: Chronic right flank wound - there is some necrotic tissue present and some purulent drainage.  I think this would benefit from operative debridement.  He is currently eating lunch and not acutely ill.  I will discuss further with Dr. Rosendo Gros in the morning.  N.p.o. at midnight for possible surgery tomorrow.  He also needs to undergo hemodialysis tomorrow.  Chronic left flank wound -continue plan per [place] RN  Zenovia Jarred 02/21/2018, 1:53 PM

## 2018-02-21 NOTE — Progress Notes (Addendum)
Subjective:  No cos,, discussed needs to do recliner chair HD before dc ,BUT not yet with Right Buttocks wound   Objective Vital signs in last 24 hours: Vitals:   02/20/18 2019 02/21/18 0012 02/21/18 0414 02/21/18 0422  BP: (!) 81/54 (!) 87/63 (!) 79/62 90/62  Pulse: 83 81 78   Resp:    14  Temp: 98.5 F (36.9 C) 98 F (36.7 C) 98.2 F (36.8 C)   TempSrc: Oral Oral Oral   SpO2: 93% 97% 91%   Weight:      Height:       Weight change:   Physical Exam: General: alert, OX3, ,NAD , Chrnically ill appearing male  Heart: RRR 1/6 sem , no rug or gallop Lungs: CTA  , unlabored  Abdomen: Bs pos , NT,ND  Extremities: 1+ LLE edema , L groin wound stable dressing clean /dry.  / RLE  1+/ L lat., R flank wound dressing  Dialysis Access: pos bruit LUA  Hero AVG    Home meds: - ambirsentan 10 qd/ amiodarone 200 qd/ eliquis 2.5 bid/ midodrine 10 tid - tadalifil 20 qd - T4/ lipitor/ sensipar/ MVI/ ultram   OP Dialysis:MWF   [facility]  4h 122kg Heparin none LUE HeRO  450/800, 3K/2.25Ca  -Hectorol 11mcg qHD -venofer 50mg  qwk -Mircera 273mcg IV q2wks - last 10/7   Problem/Plan: 1.  Bleeding L thigh wound -Dr Carlis Abbott eval./ managing ,  wound  from prior sinus tract surgery. Better.  2. Vol overload - +fluid by CXR/ CT.Hemodialysis uf  10/12  w/ 3L net negative, down to dry wt now. Cutoff BP's will be in 70's for this patient w/ severe pulm HTN. Continue Midodrine  10 mg tid  3. ESRD on HD MWF. ESRD x 20 yrs, sp transplant as well. Able to cannulate L UA Hero AVG 10/12, will plan shuntogram at a later date. HD 10/12  off schedule. Next HD in am   4. Pulm HTN- meds per admit  5. Chronic hypotension -contmidodrine 6. AFib - on amio, Eliquis held per admit team sec bleed  7.  Right Buttock  wound - per primary , Gen surg to see on Cefepime 8. Anemia ckd - Hb 8.2 , ESA recently dosed 10/07 , follow trends. 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  10. Dispo - Needs to be able to sit in Recliner for OP HD  Thus Buttocks wound needs to be stable   Ernest Haber, PA-C [facility] [phone] 02/21/2018,1:03 PM  LOS: 3 days   Pt seen, examined and agree w A/P as above.  Kelly Splinter MD [employer] pager [phone]   02/21/2018, 1:38 PM   ] Labs: Basic Metabolic Panel: Recent Labs  Lab 02/19/18 0326 02/20/18 0328 02/21/18 0428  NA 138 133* 135  K 3.1* 3.6 3.7  CL 97* 97* 98  CO2 29 23 26   GLUCOSE 72 110* 139*  BUN 13 17 20   CREATININE 3.41* 4.64* 3.99*  CALCIUM 8.5* 8.6* 9.0  PHOS 2.1* 2.8 2.9   Liver Function Tests: Recent Labs  Lab 02/18/18 0728 02/19/18 0326 02/20/18 0328 02/21/18 0428  AST 34  --   --   --   ALT 12  --   --   --   ALKPHOS 208*  --   --   --  BILITOT 0.4  --   --   --   PROT 6.0*  --   --   --   ALBUMIN 2.6* 2.2* 2.4* 2.2*   No results for input(s): LIPASE, AMYLASE in the last 168 hours. No results for input(s): AMMONIA in the last 168 hours. CBC: Recent Labs  Lab 02/18/18 0728  02/19/18 0326 02/20/18 0328 02/21/18 0428  WBC 8.2  --  6.4 8.1 7.9  NEUTROABS 7.1  --   --  8.0* 7.6  HGB 8.5*   < > 7.9* 8.5* 8.2*  HCT 27.5*   < > 25.7* 28.5* 26.5*  MCV 89.6  --  89.9 91.6 91.1  PLT 192  --  145* 121* 146*   < > = values in this interval not displayed.   Cardiac Enzymes: No results for input(s): CKTOTAL, CKMB, CKMBINDEX, TROPONINI in the last 168 hours. CBG: Recent Labs  Lab 02/20/18 2018 02/21/18 0009 02/21/18 0417 02/21/18 0759 02/21/18 1155  GLUCAP 156* 120* 145* 124* 130*    Studies/Results: No results found. Medications: . sodium chloride Stopped (02/21/18 0017)  . ceFEPime (MAXIPIME) IV Stopped (02/20/18 2318)   . ambrisentan  10 mg Oral Daily  . atorvastatin  40 mg Oral QPM  . calcium acetate  1,334 mg Oral TID WC  . Chlorhexidine Gluconate  Cloth  6 each Topical Q0600  . Chlorhexidine Gluconate Cloth  6 each Topical Q0600  . cinacalcet  90 mg Oral Q M,W,F-1800  . collagenase   Topical Daily  . collagenase   Topical BID  . diphenhydrAMINE  50 mg Oral Once  . doxercalciferol  1 mcg Intravenous Q M,W,F-HD  . feeding supplement (GLUCERNA SHAKE)  237 mL Oral BID WC  . feeding supplement (PRO-STAT SUGAR FREE 64)  30 mL Oral BID  . levothyroxine  300 mcg Oral QAC breakfast  . midodrine  10 mg Oral TID WC  . predniSONE  50 mg Oral Once  . tadalafil  20 mg Oral Daily

## 2018-02-22 ENCOUNTER — Encounter (HOSPITAL_COMMUNITY): Payer: Self-pay | Admitting: Nephrology

## 2018-02-22 ENCOUNTER — Telehealth: Payer: Self-pay | Admitting: Vascular Surgery

## 2018-02-22 LAB — PROTIME-INR
INR: 1.27
PROTHROMBIN TIME: 15.8 s — AB (ref 11.4–15.2)

## 2018-02-22 LAB — RENAL FUNCTION PANEL
ALBUMIN: 2.2 g/dL — AB (ref 3.5–5.0)
Anion gap: 10 (ref 5–15)
BUN: 30 mg/dL — AB (ref 6–20)
CO2: 28 mmol/L (ref 22–32)
CREATININE: 5.07 mg/dL — AB (ref 0.61–1.24)
Calcium: 8.9 mg/dL (ref 8.9–10.3)
Chloride: 98 mmol/L (ref 98–111)
GFR calc Af Amer: 13 mL/min — ABNORMAL LOW (ref >60)
GFR, EST NON AFRICAN AMERICAN: 12 mL/min — AB (ref >60)
Glucose, Bld: 80 mg/dL (ref 70–99)
PHOSPHORUS: 3 mg/dL (ref 2.5–4.6)
Potassium: 3.7 mmol/L (ref 3.5–5.1)
Sodium: 136 mmol/L (ref 135–145)

## 2018-02-22 LAB — CBC WITH DIFFERENTIAL/PLATELET
BASOS PCT: 0 %
Basophils Absolute: 0 10*3/uL (ref 0.0–0.1)
EOS PCT: 1 %
Eosinophils Absolute: 0.1 10*3/uL (ref 0.0–0.5)
HCT: 28.9 % — ABNORMAL LOW (ref 39.0–52.0)
HEMOGLOBIN: 8.5 g/dL — AB (ref 13.0–17.0)
Lymphocytes Relative: 1 %
Lymphs Abs: 0.1 10*3/uL — ABNORMAL LOW (ref 0.7–4.0)
MCH: 26.7 pg (ref 26.0–34.0)
MCHC: 29.4 g/dL — ABNORMAL LOW (ref 30.0–36.0)
MCV: 90.9 fL (ref 80.0–100.0)
MONO ABS: 0.3 10*3/uL (ref 0.1–1.0)
Monocytes Relative: 4 %
Neutro Abs: 7.6 10*3/uL (ref 1.7–7.7)
Neutrophils Relative %: 94 %
Platelets: 133 10*3/uL — ABNORMAL LOW (ref 150–400)
RBC: 3.18 MIL/uL — AB (ref 4.22–5.81)
RDW: 22.5 % — ABNORMAL HIGH (ref 11.5–15.5)
WBC: 8.1 10*3/uL (ref 4.0–10.5)
nRBC: 0 % (ref 0.0–0.2)
nRBC: 1 /100 WBC — ABNORMAL HIGH

## 2018-02-22 LAB — GLUCOSE, CAPILLARY
GLUCOSE-CAPILLARY: 67 mg/dL — AB (ref 70–99)
GLUCOSE-CAPILLARY: 45 mg/dL — AB (ref 70–99)
GLUCOSE-CAPILLARY: 52 mg/dL — AB (ref 70–99)
GLUCOSE-CAPILLARY: 110 mg/dL — AB (ref 70–99)
GLUCOSE-CAPILLARY: 96 mg/dL (ref 70–99)
Glucose-Capillary: 86 mg/dL (ref 70–99)
Glucose-Capillary: 90 mg/dL (ref 70–99)
Glucose-Capillary: 45 mg/dL — ABNORMAL LOW (ref 70–99)

## 2018-02-22 LAB — HEPATITIS B SURFACE ANTIGEN: Hepatitis B Surface Ag: NEGATIVE

## 2018-02-22 LAB — SURGICAL PCR SCREEN
MRSA, PCR: NEGATIVE
STAPHYLOCOCCUS AUREUS: NEGATIVE

## 2018-02-22 MED ORDER — DOXERCALCIFEROL 4 MCG/2ML IV SOLN
INTRAVENOUS | Status: AC
  Administered 2018-02-22: 1 ug via INTRAVENOUS
  Filled 2018-02-22: qty 2

## 2018-02-22 MED ORDER — MIDODRINE HCL 5 MG PO TABS
ORAL_TABLET | ORAL | Status: AC
  Administered 2018-02-22: 10 mg via ORAL
  Filled 2018-02-22: qty 2

## 2018-02-22 MED ORDER — SODIUM CHLORIDE 0.9 % IV SOLN: 1.0000 g | INTRAVENOUS | Status: DC

## 2018-02-22 MED ORDER — LEVOFLOXACIN 750 MG PO TABS
750.0000 mg | ORAL_TABLET | Freq: Once | ORAL | Status: AC
  Administered 2018-02-24: 750 mg via ORAL
  Filled 2018-02-22: qty 1

## 2018-02-22 MED ORDER — SODIUM CHLORIDE 0.9 % IV SOLN
1.0000 g | INTRAVENOUS | Status: AC
  Administered 2018-02-22: 1 g via INTRAVENOUS
  Filled 2018-02-22: qty 1

## 2018-02-22 MED ORDER — LEVOFLOXACIN 750 MG PO TABS: 750.0000 mg | ORAL_TABLET | Freq: Once | ORAL | Status: DC

## 2018-02-22 MED ORDER — LEVOFLOXACIN 750 MG PO TABS: 750.0000 mg | ORAL_TABLET | Freq: Every day | ORAL | Status: DC

## 2018-02-22 MED ORDER — DEXTROSE 50 % IV SOLN
INTRAVENOUS | Status: AC
  Administered 2018-02-22: 25 mL
  Filled 2018-02-22: qty 50

## 2018-02-22 MED ORDER — DEXTROSE 50 % IV SOLN
INTRAVENOUS | Status: AC
  Administered 2018-02-22: 19:00:00
  Filled 2018-02-22: qty 50

## 2018-02-22 MED ORDER — LEVOFLOXACIN 500 MG PO TABS
500.0000 mg | ORAL_TABLET | ORAL | Status: DC
  Filled 2018-02-22: qty 1

## 2018-02-22 MED ORDER — CALCIUM ACETATE (PHOS BINDER) 667 MG PO CAPS
667.0000 mg | ORAL_CAPSULE | Freq: Three times a day (TID) | ORAL | Status: DC
  Filled 2018-02-22: qty 1

## 2018-02-22 NOTE — Progress Notes (Signed)
Hypoglycemic Event  CBG: 45 Treatment: D50 IV 50 mL  Symptoms: None  Follow-up CBG: Time:2030 CBG Result:110  Possible Reasons for Event: Unknown     Luci Bank

## 2018-02-22 NOTE — Progress Notes (Signed)
   Subjective/Chief Complaint: Pt with no acute events   Objective: Vital signs in last 24 hours: Temp:  [97.3 F (36.3 C)-98 F (36.7 C)] 97.3 F (36.3 C) (10/14 0646) Pulse Rate:  [76-80] 76 (10/14 0646) Resp:  [16-20] 16 (10/14 0646) BP: (81-88)/(56-64) 81/57 (10/14 0646) SpO2:  [95 %-97 %] 97 % (10/14 0646) Last BM Date: 02/21/18  Intake/Output from previous day: 10/13 0701 - 10/14 0700 In: 791.7 [P.O.:680; I.V.:9.9; IV Piggyback:101.8] Out: -  Intake/Output this shift: No intake/output data recorded.  General appearance: alert and cooperative GI: soft, non-tender; bowel sounds normal; no masses,  no organomegaly Skin: right flank wound with necrotic eschar, l flank wound c/d/i, packed  Lab Results:  Recent Labs    02/21/18 0428 02/22/18 0443  WBC 7.9 8.1  HGB 8.2* 8.5*  HCT 26.5* 28.9*  PLT 146* 133*   BMET Recent Labs    02/21/18 0428 02/22/18 0443  NA 135 136  K 3.7 3.7  CL 98 98  CO2 26 28  GLUCOSE 139* 80  BUN 20 30*  CREATININE 3.99* 5.07*  CALCIUM 9.0 8.9   PT/INR Recent Labs    02/22/18 0443  LABPROT 15.8*  INR 1.27   ABG No results for input(s): PHART, HCO3 in the last 72 hours.  Invalid input(s): PCO2, PO2  Studies/Results: No results found.  Anti-infectives: Anti-infectives (From admission, onward)   Start     Dose/Rate Route Frequency Ordered Stop   02/22/18 1200  vancomycin (VANCOCIN) IVPB 1000 mg/200 mL premix  Status:  Discontinued     1,000 mg 200 mL/hr over 60 Minutes Intravenous Every M-W-F (Hemodialysis) 02/20/18 1424 02/21/18 1226   02/20/18 1430  vancomycin (VANCOCIN) IVPB 1000 mg/200 mL premix     1,000 mg 200 mL/hr over 60 Minutes Intravenous  Once 02/20/18 1422 02/20/18 1827   02/19/18 2000  ceFEPIme (MAXIPIME) 1 g in sodium chloride 0.9 % 100 mL IVPB     1 g 200 mL/hr over 30 Minutes Intravenous Every 24 hours 02/19/18 1915     02/19/18 1200  vancomycin (VANCOCIN) IVPB 1000 mg/200 mL premix  Status:   Discontinued     1,000 mg 200 mL/hr over 60 Minutes Intravenous Every M-W-F (Hemodialysis) 02/18/18 1139 02/19/18 1914   02/18/18 0730  vancomycin (VANCOCIN) IVPB 1000 mg/200 mL premix  Status:  Discontinued     1,000 mg 200 mL/hr over 60 Minutes Intravenous  Once 02/18/18 0717 02/18/18 0722   02/18/18 0730  cefTRIAXone (ROCEPHIN) 2 g in sodium chloride 0.9 % 100 mL IVPB  Status:  Discontinued     2 g 200 mL/hr over 30 Minutes Intravenous Every 24 hours 02/18/18 0717 02/19/18 1845   02/18/18 0730  vancomycin (VANCOCIN) 2,000 mg in sodium chloride 0.9 % 500 mL IVPB     2,000 mg 250 mL/hr over 120 Minutes Intravenous  Once 02/18/18 0723 02/18/18 1052      Assessment/Plan: Right flank wound Will allow HD today Will plan surgery tomorrow for D&I of right flank wound.  PO today and NPO p MN Pt may benefit from wound vac once cleaned up.   LOS: 4 days    Ralene Ok 02/22/2018

## 2018-02-22 NOTE — Telephone Encounter (Signed)
sch appt spk to pt 03/23/18 1245pm wound check

## 2018-02-22 NOTE — Progress Notes (Signed)
Pharmacy Antibiotic Note  Adam [name]. is a 57 y.o. male admitted on 02/18/2018 with sepsis.  Pharmacy has been consulted for Cefepime dosing.   ID: Vanc/CTX D#3 for sepsis, Chronic R flank wound.- Afebrile, WBC WNL, ESRD on HD, LA 1.66. Extra HD session on 10/12, 1gm given then back on schedule for Monday.   Vanc 10/10>>10/13 CTX 10/10>>10/11 Cefepime 10/11>>  Bcx x2 10/10 >> pending 10/14: MRSA PCR negative 10/12: GI panel: negative 10/12: Cdiff toxin negative 10/12: Cdiff antigen: positive 10/10: MRSA PCR negative  Plan: Holding apixaban for bleeding Cefepime 1g q24 for now    Height: 6\' 1"  (185.4 cm) Weight: 268 lb 11.9 oz (121.9 kg) IBW/kg (Calculated) : 79.9  Temp (24hrs), Avg:97.6 F (36.4 C), Min:97.3 F (36.3 C), Max:98 F (36.7 C)  Recent Labs  Lab 02/18/18 0728 02/18/18 0838 02/18/18 0935 02/19/18 0326 02/20/18 0328 02/21/18 0428 02/22/18 0443  WBC 8.2  --   --  6.4 8.1 7.9 8.1  CREATININE 4.35* 4.10*  --  3.41* 4.64* 3.99* 5.07*  LATICACIDVEN  --  1.93* 1.66  --   --   --   --     Estimated Creatinine Clearance: 22.5 mL/min (A) (by C-G formula based on SCr of 5.07 mg/dL (H)).    Allergies  Allergen Reactions  . Enalapril Swelling and Other (See Comments)    MOUTH SWELLING EDEMA  . Iodinated Diagnostic Agents Nausea And Vomiting and Nausea Only     Sidharth Leverette S. Alford Highland, PharmD, BCPS Clinical Staff Pharmacist  Eilene Ghazi Stillinger 02/22/2018 11:07 AM

## 2018-02-22 NOTE — Progress Notes (Addendum)
[facility] Progress Note   Subjective:  Laying in bed.  Denies fever, pain, n/v/d, and bleeding.  Per patient he had been changed to regular diet and this morning it had been changed back to renal.   Objective Vitals:   02/21/18 1322 02/21/18 2215 02/22/18 0646 02/22/18 0930  BP: (!) 82/59 (!) 84/56 (!) 81/57 (!) 83/53  Pulse: 80 76 76   Resp:  16 16   Temp: (!) 97.5 F (36.4 C) 98 F (36.7 C) (!) 97.3 F (36.3 C)   TempSrc: Oral Oral Oral   SpO2: 95% 95% 97%   Weight:      Height:       Physical Exam General:NAD, chronically ill appearing male Heart:RRR, +3/8 systolic murmur, no rub or gallop Lungs:CTAB, nml WOB Abdomen:soft, NTND, +BS Extremities:1+ edema in b/l LE, L groin dressed c/d/i Dialysis Access: LU HeRO +b/t   Filed Weights   02/18/18 0719 02/20/18 0727 02/20/18 1150  Weight: 122.5 kg 125.2 kg 121.9 kg    Intake/Output Summary (Last 24 hours) at 02/22/2018 0954 Last data filed at 02/21/2018 1300 Gross per 24 hour  Intake 320 ml  Output -  Net 320 ml    Additional Objective Labs: Basic Metabolic Panel: Recent Labs  Lab 02/20/18 0328 02/21/18 0428 02/22/18 0443  NA 133* 135 136  K 3.6 3.7 3.7  CL 97* 98 98  CO2 23 26 28   GLUCOSE 110* 139* 80  BUN 17 20 30*  CREATININE 4.64* 3.99* 5.07*  CALCIUM 8.6* 9.0 8.9  PHOS 2.8 2.9 3.0   Liver Function Tests: Recent Labs  Lab 02/18/18 0728  02/20/18 0328 02/21/18 0428 02/22/18 0443  AST 34  --   --   --   --   ALT 12  --   --   --   --   ALKPHOS 208*  --   --   --   --   BILITOT 0.4  --   --   --   --   PROT 6.0*  --   --   --   --   ALBUMIN 2.6*   < > 2.4* 2.2* 2.2*   < > = values in this interval not displayed.   No results for input(s): LIPASE, AMYLASE in the last 168 hours. CBC: Recent Labs  Lab 02/18/18 0728  02/19/18 0326 02/20/18 0328 02/21/18 0428 02/22/18 0443  WBC 8.2  --  6.4 8.1 7.9 8.1  NEUTROABS 7.1  --   --  8.0* 7.6 7.6  HGB 8.5*   < > 7.9* 8.5* 8.2*  8.5*  HCT 27.5*   < > 25.7* 28.5* 26.5* 28.9*  MCV 89.6  --  89.9 91.6 91.1 90.9  PLT 192  --  145* 121* 146* 133*   < > = values in this interval not displayed.   Blood Culture    Component Value Date/Time   SDES BLOOD RIGHT WRIST 02/18/2018 0735   SPECREQUEST  02/18/2018 0735    BOTTLES DRAWN AEROBIC AND ANAEROBIC Blood Culture adequate volume   CULT  02/18/2018 0735    NO GROWTH 3 DAYS Performed at [facility], [place] [address] [place], [place] [address]    REPTSTATUS PENDING 02/18/2018 0735    CBG: Recent Labs  Lab 02/21/18 1155 02/21/18 1720 02/21/18 2211 02/22/18 0810 02/22/18 0854  GLUCAP 130* 121* 103* 67* 86   Lab Results  Component Value Date   INR 1.27 02/22/2018   INR 1.21 01/12/2018  INR 1.32 12/22/2017   Studies/Results: No results found.  Medications: . sodium chloride Stopped (02/21/18 0017)  . ceFEPime (MAXIPIME) IV 1 g (02/21/18 2205)   . ambrisentan  10 mg Oral Daily  . atorvastatin  40 mg Oral QPM  . calcium acetate  1,334 mg Oral TID WC  . Chlorhexidine Gluconate Cloth  6 each Topical Q0600  . Chlorhexidine Gluconate Cloth  6 each Topical Q0600  . cinacalcet  90 mg Oral Q M,W,F-1800  . collagenase   Topical Daily  . collagenase   Topical BID  . diphenhydrAMINE  50 mg Oral Once  . doxercalciferol  1 mcg Intravenous Q M,W,F-HD  . feeding supplement (GLUCERNA SHAKE)  237 mL Oral BID WC  . feeding supplement (PRO-STAT SUGAR FREE 64)  30 mL Oral BID  . levothyroxine  300 mcg Oral QAC breakfast  . midodrine  10 mg Oral TID WC  . predniSONE  50 mg Oral Once  . tadalafil  20 mg Oral Daily    Dialysis Orders: OP Dialysis:MWF [facility]  4h 122kg Heparin none LUE HeRO  450/800, 3K/2.25Ca  -Hectorol 43mcg qHD -venofer 50mg  qwk -Mircera 235mcg IV q2wks - last 10/7  Home meds: - ambirsentan 10 qd/ amiodarone 200 qd/ eliquis 2.5 bid/ midodrine 10 tid - tadalifil 20 qd - T4/ lipitor/ sensipar/ MVI/  ultram  Assessment/Plan: 1. Bleeding L thigh wound - 2/2 excision of old sinus tract from TDC> Dr. Carlis Abbott following. No plans for surgical intervention. OP f/u wound check in 2-3 weeks.  2. Chronic R flank wound - per patient to go to OR tomorrow for debridement. On ABX. 3. Volume overload - +pulmonary edema on CXR/CT at admit.  3L net removed from HD on Sat, meeting EDW. At EDW.  Continue to titrate down as tolerated. Net UF goal today 3L.  4. ESRD - on MWF - K 3.7. orders written for HD today per regular schedule.   5. Anemia of CKD- Hgb 8.5.  6. Secondary hyperparathyroidism - Phos 3, CCa 10.3.  Continue VDRA, sensipar. Decrease Calcium acetate to 1AC TID due to hypercalcemia and low phos.  7. Chronic hypotension - on midodrine. Cut off BP's in the 70's. 8. Nutrition - Alb 2.2 - liberalized diet - now regular diet w/fluid restrictions. Follow labs.  Discussed if labs started to worsen would have to return to renal diet.  9. Pulm HTN- per primary 10. A Fib - on amio  11. Dispo - Needs to be able to sit in recliner for OP HD.    Jen Mow, PA-C [facility] Pager: [phone] 02/22/2018,9:54 AM  LOS: 4 days   Pt seen, examined and agree w A/P as above.  Kelly Splinter MD [employer] pager [phone]   02/22/2018, 11:19 AM

## 2018-02-22 NOTE — Progress Notes (Signed)
Hypoglycemic Event  CBG:45  Treatment: 15 GM carbohydrate snack  Symptoms: None  Follow-up CBG: Time:1900 CBG Result:45  Possible Reasons for Event: Unknown  Comments/MD notified:pt given another 15 mg carbohydrate snack     Luci Bank

## 2018-02-22 NOTE — Progress Notes (Signed)
Hypoglycemic Event  CBG: 52  Treatment: 15 GM carbohydrate snack  Symptoms: None  Follow-up CBG: Time:1824 CBG Result:45  Possible Reasons for Event: Unknown  Comments/MD notified:pt given more juice and Glucerna and crackers with peanut butter     Luci Bank

## 2018-02-22 NOTE — Progress Notes (Signed)
Hypoglycemic Event  CBG: 67 Treatment: D50 IV 25 mL  Symptoms: None  Follow-up CBG: QHKU:[id number] CBG Result:86  Possible Reasons for Event: Inadequate meal intake  Comments/MD notified:pt was NPO for surgery since midnight     Adam [name] M Lissett Favorite

## 2018-02-22 NOTE — Progress Notes (Signed)
[facility]  Subjective  - No more active bleeding episodes of left groin wound over weekend.  OR today with gen surg for debridement of right flank wound.   Objective (!) 81/57 76 (!) 97.3 F (36.3 C) (Oral) 16 97%  Intake/Output Summary (Last 24 hours) at 02/22/2018 0726 Last data filed at 02/21/2018 1300 Gross per 24 hour  Intake 680 ml  Output -  Net 680 ml    General NAD, resting in bed Left groin wound - wound more shallow - showing signs granulation - some fibrinous exudate in wound bed - no active signs of infection  Laboratory Lab Results: Recent Labs    02/21/18 0428 02/22/18 0443  WBC 7.9 8.1  HGB 8.2* 8.5*  HCT 26.5* 28.9*  PLT 146* 133*   BMET Recent Labs    02/21/18 0428 02/22/18 0443  NA 135 136  K 3.7 3.7  CL 98 98  CO2 26 28  GLUCOSE 139* 80  BUN 20 30*  CREATININE 3.99* 5.07*  CALCIUM 9.0 8.9    COAG Lab Results  Component Value Date   INR 1.27 02/22/2018   INR 1.21 01/12/2018   INR 1.32 12/22/2017   No results found for: PTT  Assessment/Planning:  Left groin wound from excision of old sinus tract from tunneled dialysis catheter. Continue santyl for enzymatic debridement with moist gauze over wound to prevent desiccation. No plans for surgical intervention.  Will arrange outpatient follow-up in 2-3 weeks for follow-up/wound check.   Marty Heck, MD [facility] Office: [phone] Pager: [place]

## 2018-02-22 NOTE — Progress Notes (Signed)
Family Medicine Teaching Service Daily Progress Note Intern Pager: [phone]  Patient name: Adam [name]. Medical record number: [id number] Date of birth: Feb 26, 1961 Age: 57 y.o. Gender: male  Primary Care Provider: [name], MD Consultants: Neurology, Vascular Surgery Code Status: Full  Pt Overview and Major Events to Date:  10/10: Admit for hypotension meeting SIRS, received HD 10/11: Rapid called for minimal bleeding from left groin with hemostasis achieved with manual pressure, received HD, spiked fever while on vancomycin and ceftriaxone, switched to cefepime with vancomycin, GI panel ordered with negative C. difficile toxin and positive antigen 10/12: Received HD  Assessment and Plan: Adam Singhis [name] 57 y.o.malepresenting with bleeding from L groin wound. PMH is significant forESRD on dialysis MWF, paroxymal atrial fibrillationon eliquis, pulmonary hypertension, aortic stenosis, s/p thyroidectomy in 2011, and calciphylaxis.  1 out of 4 SIRS Criteria Hypotension is chronic for patient and only positive SIRS on admission. Bps have improved since admission. Pt was febrile on 10/11 but has remained afebrile for 36h now. Clinicaly stable. D/c vanc on 10/13 and switched from ctx to cefepime for pseudomonas coverage. Blood cultures remain NGTD. His R. buttock wound with exudative drainage and induration could be source of fevers; pt is scheduled for OR debridement on 10/15. Left groin area not suspicious for infection per vasular surgery. GI panel negative. Colonized with cdiff, but non-toxigenic.  - vital signs per floor routine - Will continue to f/u on blood cultures - Switch from IV cefepime to Levaquin PO after surgery on 10/15  Right Buttock/Flank wound S/p cyst excision 2 months ago with poor healing, wound breakdown, and foul smelling exudative d/c from wound. Dressing was not taken down by the team as nursing had just evaluated and changed dressing.   -NPO at midnight for debridement in OR 10/15 -Will continue abx until postop per above  Left Groin Wound Bleeding, Resolved:   No active bleeding since am 10/9. Clinically remains stable.Dressing was not taken down by the team as nursing had just evaluated and changed dressing.  - continue to encourage ambulation - will CTM for bleeding - Continue dressings and enzymatic debridement with Santyl - Vascular surgery consulted, will f/u in 2-3 weeks for wound check - No surgical intervention at this time -Wound checks per nursing  ESRD on dialysis  Calciphylaxis  Hypotension: MWF dialyzer. Known chronic hypotension 2/2 dialysis. Renal following appreciate their recs, ok for dc from their standpoint. -  HD on 10/14 - nephrology following, appreciate recs - home midodrine 10mg  tid - phoslo 1334mg  tid and cinacalcet 90g mwf - daily rfp  Afib: Chronic. Rate controlled.  - Telemetry - Will CTM and hold Eliquis given recent epidodes of bleeding and OR tmrw - Restart Eliquis at discharge if no additional epidoses of bleeding  Pulmonary HTN:  Pt reports improvement in SOB since admission, likely secondary to HD. O2 sats lowest at 91% ORA. Repeat TTE on 10/11 with moderate to severe reduction in EF at 30-35% with diffuse hypokinesis and G1DD  - Continue cialis and ambrisentan - Continue home midodrine  Thyroid cancer s/p thyroidectomy: Chronic. Stable TSH.   - Continue Synthroid 300 mcg daily  Anemia: Chronic. Hgb stable at 8.5. Likely anemia of CKD. No signs of active bleeding on exam. Absent bleeding from left groin site since 10/9. --Transfusion threshold 7.0. - Monitoring daily CBC  Hyperlipidemia, chronic: Chronic. Stable. - Continue Lipitor 40mg   FEN/GI:Renal diet Prophylaxis:SCDs  Disposition: likely home  Subjective:    Objective: Temp:  [  97.3 F (36.3 C)-98 F (36.7 C)] 97.3 F (36.3 C) (10/14 0646) Pulse Rate:  [76-80] 76 (10/14 0646) Resp:  [16-20] 16  (10/14 0646) BP: (81-88)/(56-64) 81/57 (10/14 0646) SpO2:  [95 %-97 %] 97 % (10/14 0646) Physical Exam: General: Well appearing, resting in bed, talkative.  Cardiovascular: [name] 4/6 holosystolic murmur. Normal S1/S2, No rubs or gallops Lungs: Front lung fields CTAB, no increased WOB or signs of respiratory distress Abdomen: Obese, soft, nontender to palpation, nondistended Skin: warm, dry, no rashes Extremities: Left groin with wound dressing in place, no erythema surrounding dressing. Right flank with dressing in place, no erythema surrounding dressing.   Laboratory: Recent Labs  Lab 02/20/18 0328 02/21/18 0428 02/22/18 0443  WBC 8.1 7.9 8.1  HGB 8.5* 8.2* 8.5*  HCT 28.5* 26.5* 28.9*  PLT 121* 146* 133*   Recent Labs  Lab 02/18/18 0728  02/20/18 0328 02/21/18 0428 02/22/18 0443  NA 137   < > 133* 135 136  K 2.9*   < > 3.6 3.7 3.7  CL 94*   < > 97* 98 98  CO2 30   < > 23 26 28   BUN 16   < > 17 20 30*  CREATININE 4.35*   < > 4.64* 3.99* 5.07*  CALCIUM 9.4   < > 8.6* 9.0 8.9  PROT 6.0*  --   --   --   --   BILITOT 0.4  --   --   --   --   ALKPHOS 208*  --   --   --   --   ALT 12  --   --   --   --   AST 34  --   --   --   --   GLUCOSE 75   < > 110* 139* 80   < > = values in this interval not displayed.   Imaging/Diagnostic Tests: none   Adam [name], Adam [name], Medical Student 02/22/2018, 8:53 AM FPTS Intern pager: [phone], text pages welcome  I have personally seen and examined this patient with Adam Peterson and agree with the above note. The following is my additional documentation.   Physical Exam: General: comfortably lying in bed, NAD with non-toxic appearance HEENT: normocephalic, atraumatic, moist mucous membranes Neck: supple, non-tender without lymphadenopathy Cardiovascular: regular rate and rhythm with 4/6 systolic murmur, no rubs of gallops Lungs: clear to auscultation bilaterally with normal work of breathing on room air Abdomen: soft, non-tender,  non-distended, normoactive bowel sounds Skin: warm, dry, no rashes, cap refill < 2 seconds, dry intact dressings on left thigh, right flank wound with necrotic eschar Extremities: warm and well perfused, normal tone, no edema  Assessment/Plan Adam [name]. is [name] 57 y.o. male presenting with h/o SIRS with hypotension and fever now controlled with known chronic flank wounds.  Plan for today as follows:  Chronic wound of right flank meeting SIRS: Has necrotic eschar and breakdown.  May be source of fever.  Well-controlled with use of cefepime, currently on day 4 of 14 s/p 2 days of CTX and vancomycin.  General surgery following with plans for debridement on 10/15.  Left groin wound: Chronic.  No signs of active bleeding.  Has been followed by vascular.  Appears to be superficial and healing appropriately with enzymatic debridement and Santyl dressings.  ESRD on dialysis  Calciphylaxis  Hypotension: Chronic.  Stable.  Currently altered by nephrology, recieved HD 10/10, 10/11, 10/14.  [name]. fib: Holding Eliquis planning surgery as stated above.  On telemetry.  Rate controlled.  Pulmonary hypertension: Chronic.  Repeat TTE on 10/11 revealing moderate to severe reduction in EF at 30-35% with diffuse hypokinesis and G1DD.  Anemia of CKD: Chronic.  Stable.  No signs of active bleeding as stated above.  Transfusion threshold 7.0.  Continue monitoring.  Harriet [place], [place], PGY-3

## 2018-02-23 ENCOUNTER — Encounter (HOSPITAL_COMMUNITY)
Admission: EM | Disposition: A | Discharge status: Home Health | Payer: Self-pay | Source: Home / Self Care | Attending: Family Medicine

## 2018-02-23 ENCOUNTER — Inpatient Hospital Stay (HOSPITAL_COMMUNITY): Payer: Medicare HMO | Admitting: Certified Registered"

## 2018-02-23 ENCOUNTER — Encounter (HOSPITAL_COMMUNITY): Payer: Self-pay | Admitting: Certified Registered"

## 2018-02-23 HISTORY — PX: WOUND DEBRIDEMENT: SHX247

## 2018-02-23 LAB — CBC WITH DIFFERENTIAL/PLATELET
Abs Immature Granulocytes: 0.09 10*3/uL — ABNORMAL HIGH (ref 0.00–0.07)
Basophils Absolute: 0 10*3/uL (ref 0.0–0.1)
Basophils Relative: 0 %
Eosinophils Absolute: 0 10*3/uL (ref 0.0–0.5)
Eosinophils Relative: 0 %
HCT: 31.7 % — ABNORMAL LOW (ref 39.0–52.0)
HEMOGLOBIN: 9.3 g/dL — AB (ref 13.0–17.0)
IMMATURE GRANULOCYTES: 1 %
Lymphocytes Relative: 6 %
Lymphs Abs: 0.5 10*3/uL — ABNORMAL LOW (ref 0.7–4.0)
MCH: 27.3 pg (ref 26.0–34.0)
MCHC: 29.3 g/dL — ABNORMAL LOW (ref 30.0–36.0)
MCV: 93 fL (ref 80.0–100.0)
MONOS PCT: 10 %
Monocytes Absolute: 0.8 10*3/uL (ref 0.1–1.0)
NEUTROS PCT: 83 %
NRBC: 0.2 % (ref 0.0–0.2)
Neutro Abs: 6.9 10*3/uL (ref 1.7–7.7)
PLATELETS: 112 10*3/uL — AB (ref 150–400)
RBC: 3.41 MIL/uL — ABNORMAL LOW (ref 4.22–5.81)
RDW: 22.7 % — ABNORMAL HIGH (ref 11.5–15.5)
WBC: 8.3 10*3/uL (ref 4.0–10.5)

## 2018-02-23 LAB — RENAL FUNCTION PANEL
ANION GAP: 11 (ref 5–15)
Albumin: 2.1 g/dL — ABNORMAL LOW (ref 3.5–5.0)
BUN: 20 mg/dL (ref 6–20)
CO2: 26 mmol/L (ref 22–32)
Calcium: 8.3 mg/dL — ABNORMAL LOW (ref 8.9–10.3)
Chloride: 100 mmol/L (ref 98–111)
Creatinine, Ser: 3.82 mg/dL — ABNORMAL HIGH (ref 0.61–1.24)
GFR, EST AFRICAN AMERICAN: 19 mL/min — AB (ref >60)
GFR, EST NON AFRICAN AMERICAN: 16 mL/min — AB (ref >60)
GLUCOSE: 74 mg/dL (ref 70–99)
PHOSPHORUS: 1.8 mg/dL — AB (ref 2.5–4.6)
Potassium: 4.2 mmol/L (ref 3.5–5.1)
SODIUM: 137 mmol/L (ref 135–145)

## 2018-02-23 LAB — CULTURE, BLOOD (ROUTINE X 2)
CULTURE: NO GROWTH
Culture: NO GROWTH
Special Requests: ADEQUATE
Special Requests: ADEQUATE

## 2018-02-23 LAB — GLUCOSE, CAPILLARY
GLUCOSE-CAPILLARY: 75 mg/dL (ref 70–99)
Glucose-Capillary: 80 mg/dL (ref 70–99)
Glucose-Capillary: 101 mg/dL — ABNORMAL HIGH (ref 70–99)
Glucose-Capillary: 146 mg/dL — ABNORMAL HIGH (ref 70–99)

## 2018-02-23 SURGERY — DEBRIDEMENT, WOUND
Anesthesia: General | Site: Flank | Laterality: Right

## 2018-02-23 MED ORDER — 0.9 % SODIUM CHLORIDE (POUR BTL) OPTIME
TOPICAL | Status: DC | PRN
  Administered 2018-02-23: 1000 mL

## 2018-02-23 MED ORDER — GLYCOPYRROLATE PF 0.2 MG/ML IJ SOSY
PREFILLED_SYRINGE | INTRAMUSCULAR | Status: AC
  Filled 2018-02-23: qty 1

## 2018-02-23 MED ORDER — MIDAZOLAM HCL 5 MG/5ML IJ SOLN
INTRAMUSCULAR | Status: DC | PRN
  Administered 2018-02-23: 1 mg via INTRAVENOUS

## 2018-02-23 MED ORDER — MIDAZOLAM HCL 2 MG/2ML IJ SOLN
INTRAMUSCULAR | Status: AC
  Filled 2018-02-23: qty 2

## 2018-02-23 MED ORDER — FENTANYL CITRATE (PF) 100 MCG/2ML IJ SOLN
INTRAMUSCULAR | Status: DC | PRN
  Administered 2018-02-23: 50 ug via INTRAVENOUS
  Administered 2018-02-23: 100 ug via INTRAVENOUS
  Administered 2018-02-23: 50 ug via INTRAVENOUS

## 2018-02-23 MED ORDER — DAKINS (1/4 STRENGTH) 0.125 % EX SOLN
CUTANEOUS | Status: AC
  Administered 2018-02-23: 1
  Filled 2018-02-23: qty 473

## 2018-02-23 MED ORDER — ONDANSETRON HCL 4 MG/2ML IJ SOLN
INTRAMUSCULAR | Status: DC | PRN
  Administered 2018-02-23: 4 mg via INTRAVENOUS

## 2018-02-23 MED ORDER — PROPOFOL 10 MG/ML IV BOLUS
INTRAVENOUS | Status: AC
  Filled 2018-02-23: qty 20

## 2018-02-23 MED ORDER — GLYCOPYRROLATE 0.2 MG/ML IJ SOLN
INTRAMUSCULAR | Status: DC | PRN
  Administered 2018-02-23 (×2): 0.1 mg via INTRAVENOUS

## 2018-02-23 MED ORDER — LIDOCAINE 2% (20 MG/ML) 5 ML SYRINGE
INTRAMUSCULAR | Status: DC | PRN
  Administered 2018-02-23: 70 mg via INTRAVENOUS

## 2018-02-23 MED ORDER — PROPOFOL 10 MG/ML IV BOLUS
INTRAVENOUS | Status: AC
  Filled 2018-02-23: qty 40

## 2018-02-23 MED ORDER — DARBEPOETIN ALFA 150 MCG/0.3ML IJ SOSY
150.0000 ug | PREFILLED_SYRINGE | INTRAMUSCULAR | Status: DC
  Filled 2018-02-23: qty 0.3

## 2018-02-23 MED ORDER — PHENYLEPHRINE 40 MCG/ML (10ML) SYRINGE FOR IV PUSH (FOR BLOOD PRESSURE SUPPORT): PREFILLED_SYRINGE | INTRAVENOUS | Status: DC | PRN

## 2018-02-23 MED ORDER — VASOPRESSIN 20 UNIT/ML IV SOLN
INTRAVENOUS | Status: DC | PRN
  Administered 2018-02-23 (×4): 1 [IU] via INTRAVENOUS
  Administered 2018-02-23: 2 [IU] via INTRAVENOUS

## 2018-02-23 MED ORDER — LIDOCAINE 2% (20 MG/ML) 5 ML SYRINGE
INTRAMUSCULAR | Status: AC
  Filled 2018-02-23: qty 5

## 2018-02-23 MED ORDER — FENTANYL CITRATE (PF) 100 MCG/2ML IJ SOLN: 25.0000 ug | INTRAMUSCULAR | Status: DC | PRN

## 2018-02-23 MED ORDER — DOXYCYCLINE HYCLATE 50 MG PO CAPS: 50.0000 mg | ORAL_CAPSULE | Freq: Two times a day (BID) | ORAL | Status: DC

## 2018-02-23 MED ORDER — ALBUMIN HUMAN 5 % IV SOLN
INTRAVENOUS | Status: DC | PRN
  Administered 2018-02-23: 08:00:00 via INTRAVENOUS

## 2018-02-23 MED ORDER — DEXAMETHASONE SODIUM PHOSPHATE 10 MG/ML IJ SOLN
INTRAMUSCULAR | Status: DC | PRN
  Administered 2018-02-23: 5 mg via INTRAVENOUS

## 2018-02-23 MED ORDER — EPHEDRINE 5 MG/ML INJ
INTRAVENOUS | Status: AC
  Filled 2018-02-23: qty 10

## 2018-02-23 MED ORDER — ETOMIDATE 2 MG/ML IV SOLN
INTRAVENOUS | Status: DC | PRN
  Administered 2018-02-23: 12 mg via INTRAVENOUS

## 2018-02-23 MED ORDER — CISATRACURIUM BESYLATE 20 MG/10ML IV SOLN
INTRAVENOUS | Status: AC
  Filled 2018-02-23: qty 10

## 2018-02-23 MED ORDER — SUCCINYLCHOLINE CHLORIDE 200 MG/10ML IV SOSY
PREFILLED_SYRINGE | INTRAVENOUS | Status: DC | PRN
  Administered 2018-02-23: 100 mg via INTRAVENOUS

## 2018-02-23 MED ORDER — FENTANYL CITRATE (PF) 250 MCG/5ML IJ SOLN
INTRAMUSCULAR | Status: AC
  Filled 2018-02-23: qty 5

## 2018-02-23 MED ORDER — CEFAZOLIN SODIUM-DEXTROSE 2-3 GM-%(50ML) IV SOLR
INTRAVENOUS | Status: DC | PRN
  Administered 2018-02-23: 2 g via INTRAVENOUS

## 2018-02-23 MED ORDER — SODIUM CHLORIDE 0.9 % IV SOLN
INTRAVENOUS | Status: DC | PRN
  Administered 2018-02-23: 40 ug/min via INTRAVENOUS

## 2018-02-23 MED ORDER — CHLORHEXIDINE GLUCONATE CLOTH 2 % EX PADS: 6.0000 | MEDICATED_PAD | Freq: Every day | CUTANEOUS | Status: DC

## 2018-02-23 MED ORDER — PHENYLEPHRINE 40 MCG/ML (10ML) SYRINGE FOR IV PUSH (FOR BLOOD PRESSURE SUPPORT)
PREFILLED_SYRINGE | INTRAVENOUS | Status: AC
  Filled 2018-02-23: qty 10

## 2018-02-23 MED ORDER — ONDANSETRON HCL 4 MG/2ML IJ SOLN: 4.0000 mg | Freq: Once | INTRAMUSCULAR | Status: DC | PRN

## 2018-02-23 SURGICAL SUPPLY — 30 items
BNDG GAUZE ELAST 4 BULKY (GAUZE/BANDAGES/DRESSINGS) ×2 IMPLANT
CANISTER SUCT 3000ML PPV (MISCELLANEOUS) ×2 IMPLANT
COVER SURGICAL LIGHT HANDLE (MISCELLANEOUS) ×2 IMPLANT
COVER WAND RF STERILE (DRAPES) ×2 IMPLANT
DRAPE LAPAROSCOPIC ABDOMINAL (DRAPES) IMPLANT
DRAPE LAPAROTOMY 100X72 PEDS (DRAPES) ×2 IMPLANT
DRAPE UTILITY XL STRL (DRAPES) IMPLANT
DRSG PAD ABDOMINAL 8X10 ST (GAUZE/BANDAGES/DRESSINGS) IMPLANT
ELECT CAUTERY BLADE 6.4 (BLADE) IMPLANT
ELECT REM PT RETURN 9FT ADLT (ELECTROSURGICAL) ×2
ELECTRODE REM PT RTRN 9FT ADLT (ELECTROSURGICAL) ×1 IMPLANT
GAUZE SPONGE 4X4 12PLY STRL (GAUZE/BANDAGES/DRESSINGS) ×2 IMPLANT
GLOVE BIO SURGEON STRL SZ7.5 (GLOVE) ×2 IMPLANT
GLOVE BIOGEL PI IND STRL 8 (GLOVE) ×1 IMPLANT
GLOVE BIOGEL PI INDICATOR 8 (GLOVE) ×1
GOWN STRL REUS W/ TWL LRG LVL3 (GOWN DISPOSABLE) ×1 IMPLANT
GOWN STRL REUS W/ TWL XL LVL3 (GOWN DISPOSABLE) ×1 IMPLANT
GOWN STRL REUS W/TWL LRG LVL3 (GOWN DISPOSABLE) ×1
GOWN STRL REUS W/TWL XL LVL3 (GOWN DISPOSABLE) ×1
KIT BASIN OR (CUSTOM PROCEDURE TRAY) ×2 IMPLANT
KIT TURNOVER KIT B (KITS) ×2 IMPLANT
NS IRRIG 1000ML POUR BTL (IV SOLUTION) ×2 IMPLANT
PACK GENERAL/GYN (CUSTOM PROCEDURE TRAY) ×2 IMPLANT
PAD ABD 8X10 STRL (GAUZE/BANDAGES/DRESSINGS) ×2 IMPLANT
PAD ARMBOARD 7.5X6 YLW CONV (MISCELLANEOUS) ×2 IMPLANT
PENCIL SMOKE EVACUATOR (MISCELLANEOUS) ×2 IMPLANT
SWAB COLLECTION DEVICE MRSA (MISCELLANEOUS) IMPLANT
SWAB CULTURE ESWAB REG 1ML (MISCELLANEOUS) IMPLANT
TOWEL OR 17X24 6PK STRL BLUE (TOWEL DISPOSABLE) ×2 IMPLANT
TOWEL OR 17X26 10 PK STRL BLUE (TOWEL DISPOSABLE) ×2 IMPLANT

## 2018-02-23 NOTE — Anesthesia Preprocedure Evaluation (Addendum)
Anesthesia Evaluation  Patient identified by MRN, date of birth, ID band Patient awake    Reviewed: Allergy & Precautions, NPO status , Patient's Chart, lab work & pertinent test results  Airway Mallampati: I  TM Distance: >3 FB Neck ROM: Full    Dental no notable dental hx.    Pulmonary neg pulmonary ROS,    Pulmonary exam normal breath sounds clear to auscultation       Cardiovascular +CHF  + dysrhythmias Atrial Fibrillation + Cardiac Defibrillator (removed) + Valvular Problems/Murmurs AS  Rhythm:Regular Rate:Normal + Systolic murmurs ECG: rate 68  Pulmonary HTN   ECHO: Compared to a prior study in 08/2017, the LVEF is lower at 30-35% with severe global hypokinesis, grade 1 DD and elevated LV filling pressure. There is severe RV enlargement with moderate RV Systolic dysfunction, severe TR and severe RAE, RVSP is 61 mmHg, the IVC is dilated. The aortic valve is calcified with moderate stenosis - mean gradient of 26 mmHg and AVA around 1.2 cm2.   Neuro/Psych negative neurological ROS  negative psych ROS   GI/Hepatic negative GI ROS, Neg liver ROS,   Endo/Other  Hypothyroidism   Renal/GU ESRF and DialysisRenal disease     Musculoskeletal negative musculoskeletal ROS (+)   Abdominal (+) + obese,   Peds  Hematology  (+) anemia ,   Anesthesia Other Findings   Reproductive/Obstetrics                            Anesthesia Physical Anesthesia Plan  ASA: IV  Anesthesia Plan: General   Post-op Pain Management:    Induction: Intravenous  PONV Risk Score and Plan: 2 and Ondansetron and Treatment may vary due to age or medical condition  Airway Management Planned: Oral ETT  Additional Equipment:   Intra-op Plan:   Post-operative Plan: Extubation in OR  Informed Consent: I have reviewed the patients History and Physical, chart, labs and discussed the procedure including the risks,  benefits and alternatives for the proposed anesthesia with the patient or authorized representative who has indicated his/her understanding and acceptance.   Dental advisory given  Plan Discussed with: CRNA  Anesthesia Plan Comments:        Anesthesia Quick Evaluation

## 2018-02-23 NOTE — Progress Notes (Signed)
Family Medicine Teaching Service Daily Progress Note Intern Pager: [phone]  Patient name: Adam [name]. Medical record number: [id number] Date of birth: November 06, 1960 Age: 57 y.o. Gender: male  Primary Care Provider: [name], MD Consultants: Neurology, Vascular Surgery Code Status: Full  Pt Overview and Major Events to Date:  10/10: Admit for hypotension meeting SIRS, received HD 10/11: Rapid called for minimal bleeding from left groin with hemostasis achieved with manual pressure, received HD, spiked fever while on vancomycin and ceftriaxone, switched to cefepime with vancomycin, GI panel ordered with negative C. difficile toxin and positive antigen 10/12: Received HD 10/13: Gen surg consulted for right flank wound w/ plans for operative debridement 10/14: Pt received HD 10/15: Surgical debridement of right flank  Assessment and Plan: Adam [name]. is [name] 57 y.o. male presenting with h/o SIRS with hypotension and fever now controlled with known chronic flank wounds and resolution of bleeding from L. Groin wound. PMH is significant forESRD on dialysis MWF, paroxymal atrial fibrillationon eliquis, pulmonary hypertension, aortic stenosis, s/p thyroidectomy in 2011, and calciphylaxis.  Right Buttock/Flank Abscess: Pt initially presented with hypotension and [name] small lactate elevation. His hypotension appears chronic. On admission he was found to have [name] right flank abscess s/p cyst excision 2 months ago. The wound was poorly healing and foul smelling with exudative discharge. Pt was started on IV antibiotics (vanc 10/10 - 10/13), Cefepime (10/11- ) and ctx (10/10 - 10/11). Pt remains afebrile and without leukocytosis. Blood cultures NGTD. To OR for I&D of right flank wound. Will get dose of cefepime with HD 10/16 and then will stop abx. This will complete [name] 7 day course of abx. Likely d/c 10/16. - vital signs per floor routine - wound care per surgery - dose of cefepime  10/16 after HD - likely dc 10/16 after HD  Hypotension, Chronic: Pt remains clinically stable. Lowest diastolic 30 lowest systolic 72. I do not think this is consistent with [name] sepsis-like picture in the setting of his abscess, as this appears chronic and pt remains afebrile per above.  -Will continue home dose of midodrine 10mg  TID -Will CTM  Left Groin Wound Bleeding, Resolved:   Pt remains without active bleeding since 10/9. Bleeding believed to be positional in nature as it occurred while pt was on the commode and stopped with manual pressure. Per nephrology, pt must be able to sit in [name] recliner without triggering bleeding episodes bc he will have to receive outpatient HD. He remains clinically stable. - continue to encourage ambulation - will CTM for bleeding - Continue dressings and enzymatic debridement with Santyl - Vascular surgery consulted, will f/u in 2-3 weeks for wound check - No surgical intervention at this time -Wound checks per nursing  ESRD on dialysis  Calciphylaxis  Hypotension: MWF dialyzer. Known chronic hypotension 2/2 dialysis. Renal following appreciate their recs, ok for dc from their standpoint. -  MWF dialyzer, last HD 10/14. Last dose of cefepime on 10/16 prior to dc. - nephrology following, appreciate recs - midodrine per above - phoslo 1334mg  tid and cinacalcet 90g mwf - daily rfp  Afib Rate controlled.  - Telemetry - Will CTM and hold Eliquis given recent epidodes of bleeding and OR on 10/15 - Restart Eliquis at discharge if no additional epidoses of bleeding  Pulmonary HTN, chronic:  O2 sats remain WNLs with lowest at 94%. Remains on RA without any oxygen requirements. - Continue cialis and ambrisentan - Continue home midodrine  Thyroid cancer s/p thyroidectomy: Chronic.  Stable TSH.   - Continue Synthroid 300 mcg daily  Anemia of CKD: Chronic. Hgb improved at 9.3. Pt remains without signs of active bleeding (last episode was 10/9)   --Transfusion threshold 7.0. - Monitoring daily CBC  Hyperlipidemia, chronic: Chronic. Stable. - Continue Lipitor 40mg   FEN/GI:Renal diet Prophylaxis:SCDs  Disposition: likely home  Subjective:  Pt is doing well after his procedure. He says his pain is minimal with only some pain at the right flank wound site. Voiding appropriately  Objective: Temp:  [97.6 F (36.4 C)-98.5 F (36.9 C)] 98 F (36.7 C) (10/15 0549) Pulse Rate:  [72-83] 81 (10/15 0549) Resp:  [16-18] 18 (10/15 0549) BP: (72-91)/(30-57) 72/50 (10/15 0549) SpO2:  [94 %] 94 % (10/15 0549) Weight:  [118.3 kg-122.4 kg] 118.3 kg (10/14 1750) Physical Exam: General: Obese man in bed, watching tv. No distress Cardiovascular: 4/6 holosystolic murmur, normal D1/V6, no gallops or rubs.  Lungs: Frontal lung fields CTAB, no crackles Abdomen: soft, nontender, obese Skin: chronic changes. Right wound dressing in place, clean and dry.  Extremities: able to move w/o difficulty   Laboratory: Recent Labs  Lab 02/20/18 0328 02/21/18 0428 02/22/18 0443  WBC 8.1 7.9 8.1  HGB 8.5* 8.2* 8.5*  HCT 28.5* 26.5* 28.9*  PLT 121* 146* 133*   Recent Labs  Lab 02/18/18 0728  02/21/18 0428 02/22/18 0443 02/23/18 0610  NA 137   < > 135 136 137  K 2.9*   < > 3.7 3.7 4.2  CL 94*   < > 98 98 100  CO2 30   < > 26 28 26   BUN 16   < > 20 30* 20  CREATININE 4.35*   < > 3.99* 5.07* 3.82*  CALCIUM 9.4   < > 9.0 8.9 8.3*  PROT 6.0*  --   --   --   --   BILITOT 0.4  --   --   --   --   ALKPHOS 208*  --   --   --   --   ALT 12  --   --   --   --   AST 34  --   --   --   --   GLUCOSE 75   < > 139* 80 74   < > = values in this interval not displayed.   Imaging/Diagnostic Tests: none   Adam [name], Adam [name], Medical Student 02/23/2018, 8:05 AM FPTS Intern pager: [phone], text pages welcome ------------------------------------------------------------------------------------------- Upper Level Addendum: I have seen and  evaluated this patient along with Adam Peterson MS-3 and reviewed the above note, making necessary revisions in brown.  Adam Dawn MD PGY-2 Family Medicine Resident

## 2018-02-23 NOTE — Progress Notes (Signed)
MD Notified BP 66/43 pt asymptomatic spoke with Family medicine Teaching service and Will give noon dose of Midodrine early will continue to monitor

## 2018-02-23 NOTE — Progress Notes (Addendum)
[facility] Progress Note   Subjective:   Laying in bed.  Just returned from surgery.  Feeling well.  Denies CP, SOB, n/v/d, and pain.  Objective Vitals:   02/22/18 2146 02/23/18 0549 02/23/18 0926 02/23/18 0947  BP: (!) 80/57 (!) 72/50 (!) 67/42 (!) 66/43  Pulse: 83 81    Resp: 18 18 18    Temp: 98.1 F (36.7 C) 98 F (36.7 C)  97.7 F (36.5 C)  TempSrc:    Oral  SpO2: 94% 94%  97%  Weight:      Height:       Physical Exam General:NAD, chronically ill appearing male Heart:RRR, +9/5 systolic murmur Lungs:CTAB Abdomen:soft, doughy, NTND.  R flank wound dressed c/d/i, L axilla dressed c/d/i Extremities:1+ edema on R, 2+ edema on L, L groin dressed c/d/i Dialysis Access: LU HeRO +b/t   Filed Weights   02/20/18 1150 02/22/18 1235 02/22/18 1750  Weight: 121.9 kg 122.4 kg 118.3 kg    Intake/Output Summary (Last 24 hours) at 02/23/2018 1257 Last data filed at 02/23/2018 0915 Gross per 24 hour  Intake 623.89 ml  Output 3056 ml  Net -2432.11 ml    Additional Objective Labs: Basic Metabolic Panel: Recent Labs  Lab 02/21/18 0428 02/22/18 0443 02/23/18 0610  NA 135 136 137  K 3.7 3.7 4.2  CL 98 98 100  CO2 26 28 26   GLUCOSE 139* 80 74  BUN 20 30* 20  CREATININE 3.99* 5.07* 3.82*  CALCIUM 9.0 8.9 8.3*  PHOS 2.9 3.0 1.8*   Liver Function Tests: Recent Labs  Lab 02/18/18 0728  02/21/18 0428 02/22/18 0443 02/23/18 0610  AST 34  --   --   --   --   ALT 12  --   --   --   --   ALKPHOS 208*  --   --   --   --   BILITOT 0.4  --   --   --   --   PROT 6.0*  --   --   --   --   ALBUMIN 2.6*   < > 2.2* 2.2* 2.1*   < > = values in this interval not displayed.   No results for input(s): LIPASE, AMYLASE in the last 168 hours. CBC: Recent Labs  Lab 02/19/18 0326 02/20/18 0328 02/21/18 0428 02/22/18 0443 02/23/18 0610  WBC 6.4 8.1 7.9 8.1 8.3  NEUTROABS  --  8.0* 7.6 7.6 6.9  HGB 7.9* 8.5* 8.2* 8.5* 9.3*  HCT 25.7* 28.5* 26.5* 28.9* 31.7*  MCV  89.9 91.6 91.1 90.9 93.0  PLT 145* 121* 146* 133* 112*   Blood Culture    Component Value Date/Time   SDES BLOOD RIGHT WRIST 02/18/2018 0735   SPECREQUEST  02/18/2018 0735    BOTTLES DRAWN AEROBIC AND ANAEROBIC Blood Culture adequate volume   CULT  02/18/2018 0735    NO GROWTH 3 DAYS Performed at [facility], [place] [address], [place], [place] [address]    REPTSTATUS PENDING 02/18/2018 0735   CBG: Recent Labs  Lab 02/22/18 1900 02/22/18 2030 02/22/18 2144 02/23/18 0911 02/23/18 1202  GLUCAP 45* 110* 96 80 75    Lab Results  Component Value Date   INR 1.27 02/22/2018   INR 1.21 01/12/2018   INR 1.32 12/22/2017   Studies/Results: No results found.  Medications: . sodium chloride 0  (02/21/18 0700)   . ambrisentan  10 mg Oral Daily  . atorvastatin  40 mg Oral QPM  .  calcium acetate  667 mg Oral TID WC  . Chlorhexidine Gluconate Cloth  6 each Topical Q0600  . Chlorhexidine Gluconate Cloth  6 each Topical Q0600  . cinacalcet  90 mg Oral Q M,W,F-1800  . collagenase   Topical Daily  . collagenase   Topical BID  . doxercalciferol  1 mcg Intravenous Q M,W,F-HD  . feeding supplement (GLUCERNA SHAKE)  237 mL Oral BID WC  . feeding supplement (PRO-STAT SUGAR FREE 64)  30 mL Oral BID  . [START ON 02/26/2018] levofloxacin  500 mg Oral Q48H  . [START ON 02/24/2018] levofloxacin  750 mg Oral Once  . levothyroxine  300 mcg Oral QAC breakfast  . midodrine  10 mg Oral TID WC  . predniSONE  50 mg Oral Once  . tadalafil  20 mg Oral Daily    Dialysis Orders: MWF [facility]  4h 122kg Heparin none LUE HeRO  450/800, 3K/2.25Ca  -Hectorol 45mcg qHD -venofer 50mg  qwk -Mircera 24mcg IV q2wks - last 10/7  Home meds: - ambirsentan 10 qd/ amiodarone 200 qd/ eliquis 2.5 bid/ midodrine 10 tid - tadalifil 20 qd - T4/ lipitor/ sensipar/ MVI/ ultram  Assessment/Plan: 1. Bleeding L thigh wound - 2/2 excision of old sinus tract from [facility],  Dr. Carlis Abbott following. No plans  for surgical intervention. OP f/u wound check in 2-3 weeks.  2. Chronic R flank wound - s/p debridement today Dr. Rosendo Gros, No purulence noted. On cefepime. 3. Volume overload - +pulmonary edema on CXR/CT at admit.  Under EDW after last HD, post weight 118.3kg.  Plan for HD tomorrow with net UF goal 3L.  Will need new EDW at d/c. Still retaining significant LE and flank edema. Continue to lower edw as BP tolerates.  4. ESRD - on MWF - K 4.2. orders written for HD tomorrow per regular schedule.   5. Anemia of CKD- Hgb 9.3. Give Aranesp 159mcg qwk starting tomorrow. 6. Secondary hyperparathyroidism - Phos 1.8, CCa 9.8.  Continue VDRA, sensipar. Holding calcium acetate due to low phos.  7. Chronic hypotension - on midodrine. Cut off BP's in the 70's. 8. Nutrition - Alb 2.1 - liberalized diet - now regular diet w/fluid restrictions. Follow labs.  Discussed if labs started to worsen would have to return to renal diet.  9. Pulm HTN- per primary 10. A Fib - on amio  11. Dispo - Needs to be able to sit in recliner for OP HD.    Jen Mow, PA-C [facility] Pager: [phone] 02/23/2018,12:57 PM  LOS: 5 days   Pt seen, examined and agree w A/P as above.  Kelly Splinter MD [employer] pager [phone]   02/23/2018, 2:04 PM

## 2018-02-23 NOTE — Anesthesia Procedure Notes (Signed)
Procedure Name: Intubation Date/Time: 02/23/2018 8:12 AM Performed by: Gwyndolyn Saxon, CRNA Pre-anesthesia Checklist: Patient identified, Emergency Drugs available, Suction available and Patient being monitored Patient Re-evaluated:Patient Re-evaluated prior to induction Oxygen Delivery Method: Circle system utilized Preoxygenation: Pre-oxygenation with 100% oxygen Induction Type: IV induction Ventilation: Mask ventilation without difficulty Laryngoscope Size: Miller and 2 Grade View: Grade I Tube type: Oral Tube size: 7.5 mm Number of attempts: 1 Placement Confirmation: ETT inserted through vocal cords under direct vision,  positive ETCO2 and breath sounds checked- equal and bilateral Secured at: 23 cm Tube secured with: Tape Dental Injury: Teeth and Oropharynx as per pre-operative assessment

## 2018-02-23 NOTE — Anesthesia Postprocedure Evaluation (Signed)
Anesthesia Post Note  Patient: Adam [name].  Procedure(s) Performed: DEBRIDEMENT AND IRRIGATION OF RIGHT FLANK WOUND (Right Flank)     Patient location during evaluation: PACU Anesthesia Type: General Level of consciousness: awake and alert Pain management: pain level controlled Vital Signs Assessment: post-procedure vital signs reviewed and stable Respiratory status: spontaneous breathing, nonlabored ventilation, respiratory function stable and patient connected to nasal cannula oxygen Cardiovascular status: blood pressure returned to baseline and stable Postop Assessment: no apparent nausea or vomiting Anesthetic complications: no    Last Vitals:  Vitals:   02/23/18 0947 02/23/18 1431  BP: (!) 66/43 (!) 70/45  Pulse:  80  Resp:    Temp: 36.5 C (!) 36.3 C  SpO2: 97% 100%    Last Pain:  Vitals:   02/23/18 1431  TempSrc: Oral  PainSc:                  Ryan P Ellender

## 2018-02-23 NOTE — Transfer of Care (Signed)
Immediate Anesthesia Transfer of Care Note  Patient: Adam [name].  Procedure(s) Performed: DEBRIDEMENT AND IRRIGATION OF RIGHT FLANK WOUND (Right Flank)  Patient Location: PACU  Anesthesia Type:General  Level of Consciousness: drowsy and patient cooperative  Airway & Oxygen Therapy: Patient Spontanous Breathing and Patient connected to face mask oxygen  Post-op Assessment: Report given to RN and Post -op Vital signs reviewed and stable  Post vital signs: Reviewed and stable  Last Vitals:  Vitals Value Taken Time  BP 72/52 02/23/2018  9:09 AM  Temp    Pulse 84 02/23/2018  9:13 AM  Resp 18 02/23/2018  9:13 AM  SpO2 89 % 02/23/2018  9:13 AM  Vitals shown include unvalidated device data.  Last Pain:  Vitals:   02/22/18 2254  TempSrc:   PainSc: 4       Patients Stated Pain Goal: 2 (81/85/90 9311)  Complications: No apparent anesthesia complications

## 2018-02-23 NOTE — Op Note (Signed)
02/23/2018  8:43 AM  PATIENT:  Adam [name].  57 y.o. male  PRE-OPERATIVE DIAGNOSIS:  necrotic right flank wound  POST-OPERATIVE DIAGNOSIS:  necrotic right flank wound PROCEDURE:  Procedure(s): DEBRIDEMENT AND IRRIGATION OF RIGHT FLANK WOUND  8x8cm (Right)  SURGEON:  Surgeon(s) and Role:    Ralene Ok, MD - Primary  ANESTHESIA:   local and general  EBL:  minimal   BLOOD ADMINISTERED:none  DRAINS: none   LOCAL MEDICATIONS USED:  NONE  SPECIMEN:  No Specimen  DISPOSITION OF SPECIMEN:  N/A  COUNTS:  YES  TOURNIQUET:  * No tourniquets in log *  DICTATION: .Dragon Dictation Details of procedure: After the patient was consented he was taken back to the OR and placed in supine position.  He was placed under general endotracheal anesthesia.  Patient was then placed in the left lateral decubitus position.  Patient was then prepped and draped in sterile fashion.  A timeout was called all facts verified.  The area of eschar was sharply debrided.  The wound was retracted and there was a large subcutaneous portion of the wound.  This measured approximately 8 x 8 cm in size.  There was some fibrinous exudate throughout the wound.  This was cleaned away.  The base of the wound itself was healthy and clean.  There was no purulence that was encountered.  The fibrinous exudate was debrided throughout the entire wound.  The wound was then irrigated out with sterile saline.  Quarter strength Dakin solution was used to soaked a Kerlix.  This was packed into the wound itself.  This was dressed with 4 x 4's, ABD pad and tape.  The patient taught the procedure well was taken to recovery in stable condition.   PLAN OF CARE: Admit to inpatient   PATIENT DISPOSITION:  PACU - hemodynamically stable.   Delay start of Pharmacological VTE agent (>24hrs) due to surgical blood loss or risk of bleeding: no

## 2018-02-24 ENCOUNTER — Encounter (HOSPITAL_COMMUNITY): Payer: Self-pay | Admitting: General Surgery

## 2018-02-24 LAB — CBC WITH DIFFERENTIAL/PLATELET
Abs Immature Granulocytes: 0.16 10*3/uL — ABNORMAL HIGH (ref 0.00–0.07)
BASOS ABS: 0 10*3/uL (ref 0.0–0.1)
Basophils Relative: 0 %
EOS PCT: 0 %
Eosinophils Absolute: 0 10*3/uL (ref 0.0–0.5)
HEMATOCRIT: 27.1 % — AB (ref 39.0–52.0)
Hemoglobin: 8.2 g/dL — ABNORMAL LOW (ref 13.0–17.0)
Immature Granulocytes: 2 %
LYMPHS PCT: 6 %
Lymphs Abs: 0.6 10*3/uL — ABNORMAL LOW (ref 0.7–4.0)
MCH: 27.7 pg (ref 26.0–34.0)
MCHC: 30.3 g/dL (ref 30.0–36.0)
MCV: 91.6 fL (ref 80.0–100.0)
Monocytes Absolute: 0.7 10*3/uL (ref 0.1–1.0)
Monocytes Relative: 7 %
NRBC: 0 % (ref 0.0–0.2)
Neutro Abs: 8.3 10*3/uL — ABNORMAL HIGH (ref 1.7–7.7)
Neutrophils Relative %: 85 %
Platelets: 86 10*3/uL — ABNORMAL LOW (ref 150–400)
RBC: 2.96 MIL/uL — AB (ref 4.22–5.81)
RDW: 22.7 % — ABNORMAL HIGH (ref 11.5–15.5)
WBC: 9.8 10*3/uL (ref 4.0–10.5)

## 2018-02-24 LAB — RENAL FUNCTION PANEL
ALBUMIN: 2.3 g/dL — AB (ref 3.5–5.0)
Anion gap: 12 (ref 5–15)
BUN: 29 mg/dL — AB (ref 6–20)
CO2: 24 mmol/L (ref 22–32)
Calcium: 8.5 mg/dL — ABNORMAL LOW (ref 8.9–10.3)
Chloride: 98 mmol/L (ref 98–111)
Creatinine, Ser: 4.82 mg/dL — ABNORMAL HIGH (ref 0.61–1.24)
GFR calc Af Amer: 14 mL/min — ABNORMAL LOW (ref >60)
GFR calc non Af Amer: 12 mL/min — ABNORMAL LOW (ref >60)
GLUCOSE: 99 mg/dL (ref 70–99)
PHOSPHORUS: 2.9 mg/dL (ref 2.5–4.6)
POTASSIUM: 5.1 mmol/L (ref 3.5–5.1)
Sodium: 134 mmol/L — ABNORMAL LOW (ref 135–145)

## 2018-02-24 LAB — GLUCOSE, CAPILLARY
GLUCOSE-CAPILLARY: 106 mg/dL — AB (ref 70–99)
Glucose-Capillary: 100 mg/dL — ABNORMAL HIGH (ref 70–99)
Glucose-Capillary: 106 mg/dL — ABNORMAL HIGH (ref 70–99)

## 2018-02-24 MED ORDER — LIDOCAINE-PRILOCAINE 2.5-2.5 % EX CREA: 1.0000 "application " | TOPICAL_CREAM | CUTANEOUS | Status: DC | PRN

## 2018-02-24 MED ORDER — MIDODRINE HCL 5 MG PO TABS
ORAL_TABLET | ORAL | Status: AC
  Administered 2018-02-24: 10 mg via ORAL
  Filled 2018-02-24: qty 2

## 2018-02-24 MED ORDER — LIDOCAINE HCL (PF) 1 % IJ SOLN: 5.0000 mL | INTRAMUSCULAR | Status: DC | PRN

## 2018-02-24 MED ORDER — HEPARIN SODIUM (PORCINE) 1000 UNIT/ML DIALYSIS: 1000.0000 [IU] | INTRAMUSCULAR | Status: DC | PRN

## 2018-02-24 MED ORDER — ALTEPLASE 2 MG IJ SOLR: 2.0000 mg | Freq: Once | INTRAMUSCULAR | Status: DC | PRN

## 2018-02-24 MED ORDER — PENTAFLUOROPROP-TETRAFLUOROETH EX AERO: 1.0000 "application " | INHALATION_SPRAY | CUTANEOUS | Status: DC | PRN

## 2018-02-24 MED ORDER — DARBEPOETIN ALFA 150 MCG/0.3ML IJ SOSY
PREFILLED_SYRINGE | INTRAMUSCULAR | Status: AC
  Administered 2018-02-24: 150 ug
  Filled 2018-02-24: qty 0.3

## 2018-02-24 MED ORDER — DAKINS (1/4 STRENGTH) 0.125 % EX SOLN
Freq: Two times a day (BID) | CUTANEOUS | Status: DC
  Administered 2018-02-24 – 2018-02-25 (×3): via TOPICAL
  Filled 2018-02-24: qty 473

## 2018-02-24 MED ORDER — SODIUM CHLORIDE 0.9 % IV SOLN
100.0000 mL | INTRAVENOUS | Status: DC | PRN
Start: 2018-02-24 — End: 2018-02-24

## 2018-02-24 MED ORDER — DOXERCALCIFEROL 4 MCG/2ML IV SOLN
INTRAVENOUS | Status: AC
  Administered 2018-02-24: 2 ug
  Filled 2018-02-24: qty 2

## 2018-02-24 MED ORDER — SODIUM CHLORIDE 0.9 % IV SOLN: 100.0000 mL | INTRAVENOUS | Status: DC | PRN

## 2018-02-24 NOTE — Progress Notes (Signed)
1 Day Post-Op    FV:CBSW thigh and right flank wounds  Subjective: The right flank wound is pretty clean, changed x 1 yesterday.  I just changed it and the left thigh wound also.  Both are extremely tender.  He spent most of yesterday in HD and today he has been in the cath lab working on left arm HERO graft.  Sites are mostly clean, but extremely tender.    Objective: Vital signs in last 24 hours: Temp:  [97.4 F (36.3 C)-98.5 F (36.9 C)] 98.5 F (36.9 C) (10/16 0503) Pulse Rate:  [77-81] 77 (10/16 0503) Resp:  [18] 18 (10/16 0503) BP: (65-74)/(37-51) 74/51 (10/16 0503) SpO2:  [97 %-100 %] 100 % (10/16 0503) Last BM Date: 02/23/18 600 IV P.o. not recorded Afebrile systolic blood pressure is in the 70s. Potassium 5.1, creatinine 4.82   Intake/Output from previous day: 10/15 0701 - 10/16 0700 In: 600 [I.V.:350; IV Piggyback:250] Out: 5 [Blood:5] Intake/Output this shift: No intake/output data recorded.  General appearance: alert, cooperative, no distress and dressing changes are very painful.   Skin: Skin color, texture, turgor normal. No rashes or lesions or left thigh dressing is open and superficial, they have been placing Santyl on this and wet to dry.  the right flank wound is very deep, packed with Kerlix wet to dry, I am going to stop the Dakins for now it is just to tender.    Lab Results:  Recent Labs    02/22/18 0443 02/23/18 0610  WBC 8.1 8.3  HGB 8.5* 9.3*  HCT 28.9* 31.7*  PLT 133* 112*    BMET Recent Labs    02/22/18 0443 02/23/18 0610  NA 136 137  K 3.7 4.2  CL 98 100  CO2 28 26  GLUCOSE 80 74  BUN 30* 20  CREATININE 5.07* 3.82*  CALCIUM 8.9 8.3*   PT/INR Recent Labs    02/22/18 0443  LABPROT 15.8*  INR 1.27    Recent Labs  Lab 02/18/18 0728 02/19/18 0326 02/20/18 0328 02/21/18 0428 02/22/18 0443 02/23/18 0610  AST 34  --   --   --   --   --   ALT 12  --   --   --   --   --   ALKPHOS 208*  --   --   --   --   --   BILITOT  0.4  --   --   --   --   --   PROT 6.0*  --   --   --   --   --   ALBUMIN 2.6* 2.2* 2.4* 2.2* 2.2* 2.1*     Lipase  No results found for: LIPASE   Medications: . ambrisentan  10 mg Oral Daily  . atorvastatin  40 mg Oral QPM  . Chlorhexidine Gluconate Cloth  6 each Topical Q0600  . Chlorhexidine Gluconate Cloth  6 each Topical Q0600  . cinacalcet  90 mg Oral Q M,W,F-1800  . collagenase   Topical Daily  . collagenase   Topical BID  . darbepoetin (ARANESP) injection - DIALYSIS  150 mcg Intravenous Q Wed-HD  . doxercalciferol  1 mcg Intravenous Q M,W,F-HD  . feeding supplement (GLUCERNA SHAKE)  237 mL Oral BID WC  . feeding supplement (PRO-STAT SUGAR FREE 64)  30 mL Oral BID  . [START ON 02/26/2018] levofloxacin  500 mg Oral Q48H  . levofloxacin  750 mg Oral Once  . levothyroxine  300 mcg Oral QAC breakfast  .  midodrine  10 mg Oral TID WC  . predniSONE  50 mg Oral Once  . tadalafil  20 mg Oral Daily   . sodium chloride 0  (02/21/18 0700)   Anti-infectives (From admission, onward)   Start     Dose/Rate Route Frequency Ordered Stop   02/26/18 1200  levofloxacin (LEVAQUIN) tablet 500 mg     500 mg Oral Every 48 hours 02/22/18 1135 03/04/18 1159   02/24/18 1200  levofloxacin (LEVAQUIN) tablet 750 mg     750 mg Oral  Once 02/22/18 1135     02/23/18 1200  levofloxacin (LEVAQUIN) tablet 750 mg  Status:  Discontinued     750 mg Oral  Once 02/22/18 1124 02/22/18 1135   02/23/18 1130  doxycycline (VIBRAMYCIN) 50 MG capsule 50 mg  Status:  Discontinued     50 mg Oral Every 12 hours 02/23/18 1115 02/23/18 1115   02/22/18 1200  vancomycin (VANCOCIN) IVPB 1000 mg/200 mL premix  Status:  Discontinued     1,000 mg 200 mL/hr over 60 Minutes Intravenous Every M-W-F (Hemodialysis) 02/20/18 1424 02/21/18 1226   02/22/18 1130  levofloxacin (LEVAQUIN) tablet 750 mg  Status:  Discontinued     750 mg Oral Daily 02/22/18 1120 02/22/18 1122   02/22/18 1130  ceFEPIme (MAXIPIME) 1 g in sodium chloride  0.9 % 100 mL IVPB  Status:  Discontinued     1 g 200 mL/hr over 30 Minutes Intravenous Every 24 hours 02/22/18 1123 02/22/18 1124   02/22/18 1130  ceFEPIme (MAXIPIME) 1 g in sodium chloride 0.9 % 100 mL IVPB     1 g 200 mL/hr over 30 Minutes Intravenous Every 24 hours 02/22/18 1124 02/22/18 1902   02/20/18 1430  vancomycin (VANCOCIN) IVPB 1000 mg/200 mL premix     1,000 mg 200 mL/hr over 60 Minutes Intravenous  Once 02/20/18 1422 02/20/18 1827   02/19/18 2000  ceFEPIme (MAXIPIME) 1 g in sodium chloride 0.9 % 100 mL IVPB  Status:  Discontinued     1 g 200 mL/hr over 30 Minutes Intravenous Every 24 hours 02/19/18 1915 02/22/18 1120   02/19/18 1200  vancomycin (VANCOCIN) IVPB 1000 mg/200 mL premix  Status:  Discontinued     1,000 mg 200 mL/hr over 60 Minutes Intravenous Every M-W-F (Hemodialysis) 02/18/18 1139 02/19/18 1914   02/18/18 0730  vancomycin (VANCOCIN) IVPB 1000 mg/200 mL premix  Status:  Discontinued     1,000 mg 200 mL/hr over 60 Minutes Intravenous  Once 02/18/18 0717 02/18/18 0722   02/18/18 0730  cefTRIAXone (ROCEPHIN) 2 g in sodium chloride 0.9 % 100 mL IVPB  Status:  Discontinued     2 g 200 mL/hr over 30 Minutes Intravenous Every 24 hours 02/18/18 0717 02/19/18 1845   02/18/18 0730  vancomycin (VANCOCIN) 2,000 mg in sodium chloride 0.9 % 500 mL IVPB     2,000 mg 250 mL/hr over 120 Minutes Intravenous  Once 02/18/18 0723 02/18/18 1052      Assessment/Plan Sepsis End-stage renal disease HD  MWF Calciphylaxis Hypertension Atrial fibrillation -on Eliquis Pulmonary hypertension Chronic anemia Hyperlipidemia  Necrotic right flank wound Debridement and irrigation of right flank wound, 8 x 8 cm, 02/23/2018, Dr. Ralene Ok  FEN: Regular diet/1200 mL fluid restriction ID: Listed above;   currently on levofloxacin 750 p.o. Daily DVT: SCDs only Follow-up: [facility] clinic  Plan:  Continue dressing changes BID, I have done dressing change for today, they can do one later  tonight.  He is scheduled for  HD tomorrow.  LOS: 6 days    Laksh Hinners 02/24/2018 [phone]

## 2018-02-24 NOTE — Progress Notes (Signed)
[facility] today to evaluate for left arm fistulogram.  Patient has HERO graft placed by Dr. Scot Dock.  Apparently pulled clots out of fistula earlier today but was able to complete dialysis session.    Objective (!) 83/55 79 97.7 F (36.5 C) (Oral) 16 100%  Intake/Output Summary (Last 24 hours) at 02/24/2018 1708 Last data filed at 02/24/2018 1335 Gross per 24 hour  Intake 0 ml  Output 2800 ml  Net -2800 ml    General NAD, resting in bed Left arm hero graft - no active bleeding, weak thrill  No tissue loss in left hand  Laboratory Lab Results: Recent Labs    02/23/18 0610 02/24/18 0743  WBC 8.3 9.8  HGB 9.3* 8.2*  HCT 31.7* 27.1*  PLT 112* 86*   BMET Recent Labs    02/23/18 0610 02/24/18 0743  NA 137 134*  K 4.2 5.1  CL 100 98  CO2 26 24  GLUCOSE 74 99  BUN 20 29*  CREATININE 3.82* 4.82*  CALCIUM 8.3* 8.5*    COAG Lab Results  Component Value Date   INR 1.27 02/22/2018   INR 1.21 01/12/2018   INR 1.32 12/22/2017   No results found for: PTT  Assessment/Planning:  Plan for left upper extremity fistulogram tomorrow.  Discussed with patient limited options for intervention in PV lab given HERO graft but can obtain diagnostic images and make decision moving forward.  Please have patient NPO after midnight.   Marty Heck, MD [facility] Office: [phone] Pager: [place]

## 2018-02-24 NOTE — Progress Notes (Signed)
Asked to see patient for blood in stool and anemia.  Patient with multiple medical problems.  ESRD, flank wound with recent debridement, chronic anticoagulation (apixaban).  Patient reports having dark stools for the past 6 months.  Had endoscopy by Dr. Therisa Doyne in May which showed some esophageal ulcerations and gastritis, non-specific biopsies. Hgb in 8-10 range for couple years.  No prior colonoscopy.  I would recommend outpatient follow-up with Dr. Therisa Doyne for evaluation of outpatient colonoscopy.  Hgb fairly stable for several months, and his anemia is assuredly at least in part due to renal disease, and I doubt predominant contribution from any GI tract pathology.    Bowel prep now, in midst of recently debrided flank wound, not a good idea, and would risk secondary infection of this area.  Colonoscopy certainly not urgent, given stable hemoglobin and his relatively unchanged GI symptoms for several months.  I will contact our office to get patient follow-up with Dr. Therisa Doyne.  Please let us know if there's anything else we can do to help.

## 2018-02-24 NOTE — Progress Notes (Signed)
Wound care was done.

## 2018-02-24 NOTE — Progress Notes (Signed)
Family Medicine Teaching Service Daily Progress Note Intern Pager: [phone]  Patient name: Adam [name]. Medical record number: [id number] Date of birth: 06/13/1960 Age: 57 y.o. Gender: male  Primary Care Provider: [name], MD Consultants: Neurology, Vascular Surgery Code Status: Full  Pt Overview and Major Events to Date:  10/10: Admit for hypotension meeting SIRS, received HD 10/11: Rapid called for minimal bleeding from left groin with hemostasis achieved with manual pressure, received HD, spiked fever while on vancomycin and ceftriaxone, switched to cefepime with vancomycin, GI panel ordered with negative C. difficile toxin and positive antigen 10/12: Received HD 10/13: Gen surg consulted for right flank wound w/ plans for operative debridement 10/14: Pt received HD 10/15: Surgical debridement of right flank 10/16: Pt received HD 10/16: VVS consulted for clotted HeRO  Assessment and Plan: Bryceson Grape. is a 57 y.o. male presenting with h/o SIRS with hypotension and fever now controlled with known chronic flank wounds and resolution of bleeding from L. Groin wound. PMH is significant forESRD on dialysis MWF, paroxymal atrial fibrillationon eliquis, pulmonary hypertension, aortic stenosis, s/p thyroidectomy in 2011, and calciphylaxis.  Right Flank Abscess, stable: Pt is postop from debridement and irrigation of his right abscess on 10/15. He tolerated the procedure well. The OP note reports fibrinous exudate but no presence of purulent fluid. On exam today the dressing is clean dry and intact, no signs of erythema surrounding the wound. The pt is s/p vanc (10/10 - 10/13), Cefepime (10/11-14 ) and ctx (10/10 - 10/11). He remain afebrile w/o leukocytosis and negative Blood cx.  - per surgery, will switch to Levaquin 750mg  x 1 on 10/16 then Levaquin 500mg  q48h x 3 doses dosed at HD for a total of 14 days (will end 10/24)  - vital signs per floor routine - wound  are per surgery; will need OP follow up  Hematochezia, tarry stools, acute on chronic: Pt reports that he had an episode of BRBPR yesterday. Says he's had "chronic" melenic stools as well and weight loss, though the weight loss is difficult to quantify in the setting of his ESRD on dialysis. Pt denies family hx of colon cancer. He has not received a screening colonoscopy secondary to difficulties with scheduling from numerous hospitalizations. -Will contnue to hold Eliquis -Daily CBC -GI consult for inpatient colonoscopy  Hypotension, Chronic: Pt remains clinically stable and asymptomatic. 70/40 appears to be baseline. There remains little concern for sepsis-like picture in the setting of his abscess, as this appears chronic and pt remains afebrile per above.  -Will continue home dose of midodrine 10mg  TID -Will CTM  Left Groin Wound Bleeding, Resolved:   Last bleeding episode 10/9. Pt has been able to ambulate w/o bleeding (initialy thought bleeding was positional) He remains clinically stable. - continue to encourage ambulation - will CTM for bleeding - Continue dressings and enzymatic debridement with Santyl - Will f/u in 2-3 weeks with vascular surgery for wound check  ESRD on dialysis  Calciphylaxis  Hypotension: MWF dialyzer. Clotted system at start of HD today, pulling large clots, whistle sound on auscultation. Consulted VVS to assess access and need for shuntogram.  Known chronic hypotension 2/2 dialysis. Renal following appreciate their recs, ok for dc from their standpoint. -  MWF dialyzer, last HD 10/14. Scheduled for dialysis 10/16 - nephrology following, appreciate recs - VVS for access - midodrine per above - phoslo 1334mg  tid and cinacalcet 90g mwf - daily rfp  Afib Rate controlled.  - Telemetry - Will  hold Eliquis per above  Pulmonary HTN, chronic:  O2 sats remain WNLs on RA without any oxygen requirements. - Continue cialis and ambrisentan - Continue home  midodrine  Thyroid cancer s/p thyroidectomy: Chronic. Stable TSH.   - Continue Synthroid 300 mcg daily  Anemia of CKD: Chronic. Hgb stable at 8.2, mild drop from yesterday 9.3. Pt remains without signs of active bleeding (last episode was 10/9)  --Transfusion threshold 7.0. - Monitoring daily CBC  Hyperlipidemia, chronic: Chronic. Stable. - Continue Lipitor 40mg   FEN/GI:Renal diet Prophylaxis:SCDs  Disposition: pending clinical workup  Subjective:  Pt says he is doing well. Reports he feels fine. He is tolerating PO liquids and solids. He reports minimal pain from the wounds.   Objective: Temp:  [97.4 F (36.3 C)-98.5 F (36.9 C)] 98.5 F (36.9 C) (10/16 0503) Pulse Rate:  [77-81] 77 (10/16 0503) Resp:  [18] 18 (10/16 0503) BP: (65-74)/(37-51) 74/51 (10/16 0503) SpO2:  [97 %-100 %] 100 % (10/16 0503) Physical Exam: General: Obese man in bed, no signs of distress.  Cardiovascular: 4/6 holosystolic murmur, normal Q2/I2, no rubs or gallops 4 Lungs: Frontal lung fields CTAB without wheezes or crackles, no signs of respiratory distress. Abdomen: nontender, soft, nondistended, obese. Skin: Right flank dressing in place, dry, no signs of pus or erythema surrounding wound. Left groin dressing in place an dry.  Extremities: Able to move w/o difficulty.   Laboratory: Recent Labs  Lab 02/21/18 0428 02/22/18 0443 02/23/18 0610  WBC 7.9 8.1 8.3  HGB 8.2* 8.5* 9.3*  HCT 26.5* 28.9* 31.7*  PLT 146* 133* 112*   Recent Labs  Lab 02/18/18 0728  02/21/18 0428 02/22/18 0443 02/23/18 0610  NA 137   < > 135 136 137  K 2.9*   < > 3.7 3.7 4.2  CL 94*   < > 98 98 100  CO2 30   < > 26 28 26   BUN 16   < > 20 30* 20  CREATININE 4.35*   < > 3.99* 5.07* 3.82*  CALCIUM 9.4   < > 9.0 8.9 8.3*  PROT 6.0*  --   --   --   --   BILITOT 0.4  --   --   --   --   ALKPHOS 208*  --   --   --   --   ALT 12  --   --   --   --   AST 34  --   --   --   --   GLUCOSE 75   < > 139* 80 74   <  > = values in this interval not displayed.   Imaging/Diagnostic Tests: none   I have seen and evaluated the patient with medical student [name]. I am in agreement with the note above in its revised form. My additions are in blue.  [name], MD Family Medicine, PGY-3  [name], April A, Medical Student 02/24/2018, 7:46 AM FPTS Intern pager: [phone], text pages welcome -------------------------------------------------------------------------------------------

## 2018-02-24 NOTE — Progress Notes (Addendum)
[facility] Progress Note   Subjective:  Patient seen in dialysis.  Clotted off system this morning with large clots being pulled from lines and HeRO.  CN auscultated prior to cannulation and reported a whistle at the proximal portion of the HeRO. Reports issues with clotting as OP and he was going to schedule a shuntogram but decided not to because it was running well the next treatment. No other complaints.   Objective Vitals:   02/23/18 1600 02/23/18 2152 02/23/18 2220 02/24/18 0503  BP: (!) 72/49 (!) 65/37 (!) 70/50 (!) 74/51  Pulse: 77 81  77  Resp:  18  18  Temp:  98.5 F (36.9 C)  98.5 F (36.9 C)  TempSrc:  Oral  Oral  SpO2:  97%  100%  Weight:      Height:       Physical Exam General:NAD, chronically ill appear male Heart:RRR, +3/2 systolic murmur Lungs:CTAB Abdomen:soft, doughy, NTND.  R flank wound dressed Extremities:1+edema on R, 2+ edema on L, L groin dressed  Dialysis Access: LU HeRO cannulated  Filed Weights   02/20/18 1150 02/22/18 1235 02/22/18 1750  Weight: 121.9 kg 122.4 kg 118.3 kg    Intake/Output Summary (Last 24 hours) at 02/24/2018 1053 Last data filed at 02/24/2018 0321 Gross per 24 hour  Intake 0 ml  Output -  Net 0 ml    Additional Objective Labs: Basic Metabolic Panel: Recent Labs  Lab 02/22/18 0443 02/23/18 0610 02/24/18 0743  NA 136 137 134*  K 3.7 4.2 5.1  CL 98 100 98  CO2 28 26 24   GLUCOSE 80 74 99  BUN 30* 20 29*  CREATININE 5.07* 3.82* 4.82*  CALCIUM 8.9 8.3* 8.5*  PHOS 3.0 1.8* 2.9   Liver Function Tests: Recent Labs  Lab 02/18/18 0728  02/22/18 0443 02/23/18 0610 02/24/18 0743  AST 34  --   --   --   --   ALT 12  --   --   --   --   ALKPHOS 208*  --   --   --   --   BILITOT 0.4  --   --   --   --   PROT 6.0*  --   --   --   --   ALBUMIN 2.6*   < > 2.2* 2.1* 2.3*   < > = values in this interval not displayed.   CBC: Recent Labs  Lab 02/20/18 0328 02/21/18 0428 02/22/18 0443  02/23/18 0610 02/24/18 0743  WBC 8.1 7.9 8.1 8.3 9.8  NEUTROABS 8.0* 7.6 7.6 6.9 8.3*  HGB 8.5* 8.2* 8.5* 9.3* 8.2*  HCT 28.5* 26.5* 28.9* 31.7* 27.1*  MCV 91.6 91.1 90.9 93.0 91.6  PLT 121* 146* 133* 112* 86*   Blood Culture    Component Value Date/Time   SDES BLOOD RIGHT WRIST 02/18/2018 0735   SPECREQUEST  02/18/2018 0735    BOTTLES DRAWN AEROBIC AND ANAEROBIC Blood Culture adequate volume   CULT  02/18/2018 0735    NO GROWTH 5 DAYS Performed at [facility], [place] [address], [place], [place] [address]    REPTSTATUS 02/23/2018 FINAL 02/18/2018 0735   CBG: Recent Labs  Lab 02/23/18 1705 02/23/18 2148 02/24/18 0652 02/24/18 0755 02/24/18 0756  GLUCAP 101* 146* 106* 100* 106*   Studies/Results: No results found.  Medications: . sodium chloride 0  (02/21/18 0700)  . sodium chloride    . sodium chloride     . ambrisentan  10  mg Oral Daily  . atorvastatin  40 mg Oral QPM  . Chlorhexidine Gluconate Cloth  6 each Topical Q0600  . Chlorhexidine Gluconate Cloth  6 each Topical Q0600  . cinacalcet  90 mg Oral Q M,W,F-1800  . collagenase   Topical Daily  . collagenase   Topical BID  . darbepoetin (ARANESP) injection - DIALYSIS  150 mcg Intravenous Q Wed-HD  . doxercalciferol  1 mcg Intravenous Q M,W,F-HD  . feeding supplement (GLUCERNA SHAKE)  237 mL Oral BID WC  . feeding supplement (PRO-STAT SUGAR FREE 64)  30 mL Oral BID  . [START ON 02/26/2018] levofloxacin  500 mg Oral Q48H  . levofloxacin  750 mg Oral Once  . levothyroxine  300 mcg Oral QAC breakfast  . midodrine  10 mg Oral TID WC  . predniSONE  50 mg Oral Once  . sodium hypochlorite   Topical BID  . tadalafil  20 mg Oral Daily    Dialysis Orders: MWF [facility]  4h 122kg Heparin none LUE HeRO  450/800, 3K/2.25Ca  -Hectorol 46mcg qHD -venofer 50mg  qwk -Mircera 248mcg IV q2wks - last 10/7  Home meds: - ambirsentan 10 qd/ amiodarone 200 qd/ eliquis 2.5 bid/ midodrine 10 tid - tadalifil  20 qd - T4/ lipitor/ sensipar/ MVI/ ultram  Assessment/Plan: 1.Bleeding L thigh wound - 2/2 excision of old sinus tract from [facility],  Dr. Carlis Abbott following. No plans for surgical intervention. OP f/u wound check in 2-3 weeks.  2. Chronic R flank wound - s/p debridement 05/1617 by Dr. Rosendo Gros, No purulence noted. On cefepime. 3. Volume overload - +pulmonary edema on CXR/CT at admit. Lower edw if possible 4. ESRD -on HD MWF.  K 5.1. HD today per regular schedule. Clotted system at start of HD today, pulling large clots, whistle sound on auscultation. Consulted VVS to assess access and need for shuntogram.  5. Anemia of CKD-Hgb 8.2. Give Aranesp 1102mcg qwk starting today. 6. Secondary hyperparathyroidism -Phos 2.9, CCa 9.8. Continue VDRA, sensipar. Holding calcium acetate due to low phos.  7. Chronic hypotension - on midodrine. Cut off BP's in the 70's. 8.Nutrition - Alb 2.3 - liberalized diet - now regular diet w/fluid restrictions. Follow labs. Discussed if labs started to worsen would have to return to renal diet.  9. Pulm HTN- per primary 10. A Fib - on amio  11. Dispo - Needs to be able to sit in recliner for OP HD.   Jen Mow, PA-C [facility] Pager: [phone] 02/24/2018,10:53 AM  LOS: 6 days   Pt seen, examined and agree w A/P as above.  Kelly Splinter MD [employer] pager [phone]   02/24/2018, 12:29 PM

## 2018-02-25 ENCOUNTER — Encounter (HOSPITAL_COMMUNITY)
Admission: EM | Disposition: A | Discharge status: Home Health | Payer: Self-pay | Source: Home / Self Care | Attending: Family Medicine

## 2018-02-25 ENCOUNTER — Encounter (HOSPITAL_COMMUNITY): Payer: Self-pay | Admitting: Vascular Surgery

## 2018-02-25 DIAGNOSIS — E876 Hypokalemia: Secondary | ICD-10-CM

## 2018-02-25 HISTORY — PX: A/V SHUNTOGRAM: CATH118297

## 2018-02-25 SURGERY — A/V SHUNTOGRAM
Anesthesia: LOCAL

## 2018-02-25 MED ORDER — LIDOCAINE HCL (PF) 1 % IJ SOLN
INTRAMUSCULAR | Status: AC
  Filled 2018-02-25: qty 30

## 2018-02-25 MED ORDER — LIDOCAINE HCL (PF) 1 % IJ SOLN
INTRAMUSCULAR | Status: DC | PRN
  Administered 2018-02-25: 3 mL

## 2018-02-25 MED ORDER — METHYLPREDNISOLONE SODIUM SUCC 125 MG IJ SOLR
125.0000 mg | Freq: Once | INTRAMUSCULAR | Status: AC
  Administered 2018-02-25: 125 mg via INTRAVENOUS
  Filled 2018-02-25: qty 2

## 2018-02-25 MED ORDER — HEPARIN (PORCINE) IN NACL 1000-0.9 UT/500ML-% IV SOLN
INTRAVENOUS | Status: AC
  Filled 2018-02-25: qty 500

## 2018-02-25 MED ORDER — HEPARIN (PORCINE) IN NACL 1000-0.9 UT/500ML-% IV SOLN: INTRAVENOUS | Status: DC | PRN

## 2018-02-25 MED ORDER — DIPHENHYDRAMINE HCL 50 MG/ML IJ SOLN
25.0000 mg | Freq: Once | INTRAMUSCULAR | Status: AC
  Administered 2018-02-25: 25 mg via INTRAVENOUS
  Filled 2018-02-25: qty 1

## 2018-02-25 MED ORDER — HEPARIN (PORCINE) IN NACL 1000-0.9 UT/500ML-% IV SOLN
INTRAVENOUS | Status: DC | PRN
  Administered 2018-02-25: 500 mL

## 2018-02-25 MED ORDER — IODIXANOL 320 MG/ML IV SOLN
INTRAVENOUS | Status: DC | PRN
  Administered 2018-02-25: 25 mL via INTRAVENOUS

## 2018-02-25 MED ORDER — CHLORHEXIDINE GLUCONATE CLOTH 2 % EX PADS
6.0000 | MEDICATED_PAD | Freq: Every day | CUTANEOUS | Status: DC
Start: 2018-02-26 — End: 2018-02-26

## 2018-02-25 MED ORDER — LEVOFLOXACIN 500 MG PO TABS: 500.0000 mg | ORAL_TABLET | ORAL | 0 refills | Status: DC

## 2018-02-25 SURGICAL SUPPLY — 9 items
BAG SNAP BAND KOVER 36X36 (MISCELLANEOUS) ×2 IMPLANT
COVER DOME SNAP 22 D (MISCELLANEOUS) ×2 IMPLANT
KIT MICROPUNCTURE NIT STIFF (SHEATH) ×2 IMPLANT
PROTECTION STATION PRESSURIZED (MISCELLANEOUS) ×2
SHEATH PROBE COVER 6X72 (BAG) ×2 IMPLANT
STATION PROTECTION PRESSURIZED (MISCELLANEOUS) ×1 IMPLANT
STOPCOCK MORSE 400PSI 3WAY (MISCELLANEOUS) ×18 IMPLANT
TRAY PV CATH (CUSTOM PROCEDURE TRAY) ×2 IMPLANT
TUBING CIL FLEX 10 FLL-RA (TUBING) ×2 IMPLANT

## 2018-02-25 NOTE — Progress Notes (Signed)
Nutrition Follow-up  DOCUMENTATION CODES:   Non-severe (moderate) malnutrition in context of chronic illness, Obesity unspecified  INTERVENTION:   - Continue Glucerna Shake po BID, each supplement provides 220 kcal and 10 grams of protein  - Continue 30 ml Prostat BID, each supplement provides 100 kcals and 15 grams protein.  - Provided brief education regarding importance of adequate protein intake in wound healing and ESRD on HD  NUTRITION DIAGNOSIS:   Moderate Malnutrition related to chronic illness (ESRD on HD) as evidenced by mild fat depletion, moderate fat depletion, mild muscle depletion, moderate muscle depletion.  Ongoing  GOAL:   Patient will meet greater than or equal to 90% of their needs  Progressing  MONITOR:   PO intake, Supplement acceptance, Weight trends, Labs, I & O's, Skin  REASON FOR ASSESSMENT:   Malnutrition Screening Tool    ASSESSMENT:   57 year old male who presented to the ED on 10/10 with bleeding from a left groin wound. Pt previously had a tunneled dialysis catheter in left groin. PMH significant for atrial fibrillation on Eliquis, ESRD on HD, anemia, hypothyroidism, hypertension, calciphylaxis, and hyperlipidemia.   10/15 - s/o surgical debridement of right flank wound 10/17 - s/p left arm hero graft and left arm fistulogram  Noted diet advanced to regular to promote PO intake.  Spoke with pt at bedside. Pt in good spirits and states he is feeling much better than he was at last RD visit. Pt believes this is because he is eating well.  Pt with questions about oral nutrition supplements after discharge. All questions answered. Discussed importance of adequate protein intake in wound healing and ESRD on HD. Pt expressed understanding.  Weight down 10 lbs since admission with fluctuations between 266-276 lbs throughout admission. Likely related to fluid status.  Net UF yesterday after dialysis: 2800 ml Post-dialysis weight: 121.1  kg  Meal Completion: 100%  Medications reviewed and include: Glucerna BID, Pro-stat BID, levothyroxine 300 mcg daily  Labs reviewed: sodium 134 (L), BUN 29 (H), creatinine 4.82 (H), hemoglobin 8.2 (L), HCT 27.1 (L)  Diet Order:   Diet Order            Diet regular Room service appropriate? Yes; Fluid consistency: Thin  Diet effective now        Diet - low sodium heart healthy              EDUCATION NEEDS:   Education needs have been addressed  Skin:  Skin Assessment: Skin Integrity Issues: Incisions: groin, R arm, flank Other: wounds to L flank, R lumbar region  Last BM:  10/15  Height:   Ht Readings from Last 1 Encounters:  02/18/18 6\' 1"  (1.854 m)    Weight:   Wt Readings from Last 1 Encounters:  02/24/18 121.1 kg    Ideal Body Weight:  83.64 kg  BMI:  Body mass index is 35.22 kg/m.  Estimated Nutritional Needs:   Kcal:  2200-2400  Protein:  120-135 grams  Fluid:  UOP + 1000 ml    Gaynell Face, MS, RD, LDN Inpatient Clinical Dietitian Pager: [phone] Weekend/After Hours: [phone]

## 2018-02-25 NOTE — Op Note (Signed)
    OPERATIVE NOTE   PROCEDURE: 1. left arm HERO graft arteriovenous cannulation under ultrasound guidance 2. left arm fistulogram  PRE-OPERATIVE DIAGNOSIS: Malfunctioning left HERO graft  POST-OPERATIVE DIAGNOSIS: same as above   SURGEON: Marty Heck, MD  ANESTHESIA: local  ESTIMATED BLOOD LOSS: 5 cc  FINDING(S): 1. The left arm hero graft appeared widely patent.  I saw no evidence of anastomotic stenosis to the brachial artery.  The entire graft appeared widely patent and was properly positioned in the right atrium with no overt evidence of thrombus around the distal catheter in the right atrium.  SPECIMEN(S):  None  CONTRAST: 20 mL  INDICATIONS: Adam [name]. is a 57 y.o. male who  presents with malfunctioning left HERO graft.  Reportedly had short piece of thrombus pulled from the graft yesterday in dialysis.  The graft worked appropriately after this.  The patient is scheduled for left arm fistulogram.  The patient is aware the risks include but are not limited to: bleeding, infection, thrombosis of the cannulated access, and possible anaphylactic reaction to the contrast.  The patient is aware of the risks of the procedure and elects to proceed forward.  DESCRIPTION: After full informed written consent was obtained, the patient was brought back to the angiography suite and placed supine upon the angiography table.  The patient was connected to monitoring equipment.  The left arm was prepped and draped in the standard fashion for a left arm fistulogram.  Under ultrasound guidance, the left HERO graft was cannulated in the upper arm with a micropuncture needle.  The microwire was advanced into the fistula and the needle was exchanged for the a microsheath, which was lodged 2 cm into the access.  The wire was removed and the sheath was connected to the IV extension tubing.  Hand injections were completed to image the access from the antecubitum up to the level of  axilla.  The central venous structures were also imaged by hand injections.  I did not see any obvious defect with a hero graft itself.  It appeared to be widely patent throughout its course in a properly positioned in the right atrium.  I also evaluated the arterial anastomosis in multiple views and did not see any anastomotic stenosis.  A 4-0 Monocryl purse-string suture was sewn around the sheath.  The sheath was removed while tying down the suture.  A sterile bandage was applied to the puncture site.  COMPLICATIONS: None  CONDITION: Stable  Marty Heck, MD [facility] Office: [phone] Pager: [phone]  02/25/2018 8:02 AM  Marty Heck

## 2018-02-25 NOTE — Progress Notes (Addendum)
[facility] Progress Note   Subjective:   Patient seen at bedside post procedure.  No new complaints.  Objective Vitals:   02/25/18 0746 02/25/18 0751 02/25/18 0756 02/25/18 0801  BP: (!) 85/55 (!) 86/55 90/60 (!) 87/55  Pulse: 75 75 71 75  Resp: 15 15 14 14   Temp:      TempSrc:      SpO2: 96% 98% 100% 100%  Weight:      Height:       Physical Exam General:NAD, chronically ill appearing male Heart:RRR, +4/4 systolic murmur Lungs:CTAB Abdomen:soft, doughy, NTND. R flank wound dressed Extremities:1+ edema on R, 2+edema on L, L groin dressed Dialysis Access: LU HeRO +b   Filed Weights   02/22/18 1750 02/24/18 0920 02/24/18 1335  Weight: 118.3 kg 123.9 kg 121.1 kg   No intake or output data in the 24 hours ending 02/25/18 1351  Additional Objective Labs: Basic Metabolic Panel: Recent Labs  Lab 02/22/18 0443 02/23/18 0610 02/24/18 0743  NA 136 137 134*  K 3.7 4.2 5.1  CL 98 100 98  CO2 28 26 24   GLUCOSE 80 74 99  BUN 30* 20 29*  CREATININE 5.07* 3.82* 4.82*  CALCIUM 8.9 8.3* 8.5*  PHOS 3.0 1.8* 2.9   Liver Function Tests: Recent Labs  Lab 02/22/18 0443 02/23/18 0610 02/24/18 0743  ALBUMIN 2.2* 2.1* 2.3*   CBC: Recent Labs  Lab 02/20/18 0328 02/21/18 0428 02/22/18 0443 02/23/18 0610 02/24/18 0743  WBC 8.1 7.9 8.1 8.3 9.8  NEUTROABS 8.0* 7.6 7.6 6.9 8.3*  HGB 8.5* 8.2* 8.5* 9.3* 8.2*  HCT 28.5* 26.5* 28.9* 31.7* 27.1*  MCV 91.6 91.1 90.9 93.0 91.6  PLT 121* 146* 133* 112* 86*   Blood Culture    Component Value Date/Time   SDES BLOOD RIGHT WRIST 02/18/2018 0735   SPECREQUEST  02/18/2018 0735    BOTTLES DRAWN AEROBIC AND ANAEROBIC Blood Culture adequate volume   CULT  02/18/2018 0735    NO GROWTH 5 DAYS Performed at [facility], [place] [address] [place], [place] [address]    REPTSTATUS 02/23/2018 FINAL 02/18/2018 0735   CBG: Recent Labs  Lab 02/23/18 1705 02/23/18 2148 02/24/18 0652 02/24/18 0755 02/24/18 0756   GLUCAP 101* 146* 106* 100* 106*   Studies/Results: No results found.  Medications: . sodium chloride 0  (02/21/18 0700)   . ambrisentan  10 mg Oral Daily  . atorvastatin  40 mg Oral QPM  . Chlorhexidine Gluconate Cloth  6 each Topical Q0600  . Chlorhexidine Gluconate Cloth  6 each Topical Q0600  . cinacalcet  90 mg Oral Q M,W,F-1800  . collagenase   Topical Daily  . collagenase   Topical BID  . darbepoetin (ARANESP) injection - DIALYSIS  150 mcg Intravenous Q Wed-HD  . doxercalciferol  1 mcg Intravenous Q M,W,F-HD  . feeding supplement (GLUCERNA SHAKE)  237 mL Oral BID WC  . feeding supplement (PRO-STAT SUGAR FREE 64)  30 mL Oral BID  . [START ON 02/26/2018] levofloxacin  500 mg Oral Q48H  . levothyroxine  300 mcg Oral QAC breakfast  . midodrine  10 mg Oral TID WC  . predniSONE  50 mg Oral Once  . sodium hypochlorite   Topical BID  . tadalafil  20 mg Oral Daily    Dialysis Orders: MWF [facility]  4h 122kg Heparin none LUE HeRO  450/800, 3K/2.25Ca  -Hectorol 45mcg qHD -venofer 50mg  qwk -Mircera 273mcg IV q2wks - last 10/7  Home meds: -  ambirsentan 10 qd/ amiodarone 200 qd/ eliquis 2.5 bid/ midodrine 10 tid - tadalifil 20 qd - T4/ lipitor/ sensipar/ MVI/ ultram  Assessment/Plan: 1.Bleeding L thigh wound - 2/2 excision of old sinus tract from TDC,Dr. Carlis Abbott following. No plans for surgical intervention. OP f/u wound check in 2-3 weeks.  2. Chronic R flank wound -s/p debridement 05/1617 by Dr. Rosendo Gros, No purulence noted. On cefepime. 3. Volume overload - +pulmonary edema on CXR/CT at admit.Improved, a little under EDW post HD, cont to lower if possible.  4. ESRD -on HD MWF.  K 5.1 pre HD yesterday. Orders written for HD tomorrow in chair.  Fistulogram completed today by Dr. Carlis Abbott, showed no stenosis/thrombus - widely patent HeRO graft.  5. Anemia of CKD-Hgb8.2.Give Aranesp 1104mcg qwk starting 10/16. 6. Secondary hyperparathyroidism -Phos2.9, CCa9.8.  Continue VDRA, sensipar.Holding calcium acetate due to low phos. 7. Chronic hypotension - on midodrine. Cut off BP's in the 70's. 8.Nutrition - Alb 2.3- liberalized diet - now regular diet w/fluid restrictions. Follow labs. Discussed if labs started to worsen would have to return to renal diet.  9. Pulm HTN- per primary 10. A Fib - on amio  11. Dispo - Needs to be able to sit in recliner for OP HD.    Jen Mow, PA-C [facility] Pager: [phone] 02/25/2018,1:51 PM  LOS: 7 days   Pt seen, examined and agree w A/P as above.  Kelly Splinter MD [employer] pager [phone]   02/25/2018, 2:19 PM

## 2018-02-25 NOTE — Progress Notes (Signed)
Family Medicine Teaching Service Daily Progress Note Intern Pager: [phone]  Patient name: Adam [name]. Medical record number: [id number] Date of birth: April 05, 1961 Age: 57 y.o. Gender: male  Primary Care Provider: [name], MD Consultants: Neurology, Vascular Surgery Code Status: Full  Pt Overview and Major Events to Date:  10/10: Admit for hypotension meeting SIRS, received HD 10/11: Rapid called for minimal bleeding from left groin with hemostasis achieved with manual pressure, received HD, spiked fever while on vancomycin and ceftriaxone, switched to cefepime with vancomycin, GI panel ordered with negative C. difficile toxin and positive antigen 10/12: Received HD 10/13: Gen surg consulted for right flank wound w/ plans for operative debridement 10/14: Pt received HD 10/15: Surgical debridement of right flank 10/16: Pt received HD through LUE HERO graft and VVS consulted for clotted HeRO 10/17: VVS plan for LUE fistulogram + left arm HERO graft AV cannulation under ultrasound guidance  Assessment and Plan: Adam [name]. is [name] 57 y.o. male presenting with h/o SIRS with hypotension and fever now s/p debridement of right flank abscess on 10/15 and resolution of bleeding from L. Groin wound. PMH is significant forESRD on dialysis MWF, paroxymal atrial fibrillationon eliquis, pulmonary hypertension, aortic stenosis, s/p thyroidectomy in 2011, and calciphylaxis.  Right Flank Abscess, stable: Pt is postop from debridement and irrigation of his right abscess on 10/15 with intraoperative findings of fibrinous exudate w/o purulence. Continues to heal well in the postoperative period. On exam today the dressing is clean dry and intact, no signs of erythema surrounding the wound. The pt is s/p vanc (10/10 - 10/13), Cefepime (10/11-14 ) and ctx (10/10 - 10/11). Patient remains w/o signs of infection. Switched to levaquin post-op and will remain on to complete [name] 14 day  course. - per surgery, will switch to Levaquin 750mg  x 1 on 10/16 then Levaquin 500mg  q48h x 3 doses dosed at HD for [name] total of 14 days (will end 10/24)  - vital signs per floor routine - wound are per surgery; will need OP follow up  Hematochezia, tarry stools, acute on chronic: Pt reports that he had an episode of BRBPR on 10/15 with chronic melenic stools and wt loss. Denies family hx of colon cancer and has not received [name] screening colonoscopy secondary to difficulties with scheduling from numerous hospitalizations. [facility] was consulted who recommended outpatient follow-up with Dr. Therisa [name] for colonoscopy.  -Will restart Eliquis -Daily CBC -Outpatient GI f/u  Hypotension, Chronic: Pt remains clinically stable and asymptomatic at his baseline of 70/4.  -Will continue home dose of midodrine 10mg  TID -Will CTM  Left Groin Wound Bleeding, Resolved:   Last bleeding episode 10/9. Pt has been able to ambulate w/o bleeding (initialy thought bleeding was positional). He remains clinically stable. - continue to encourage ambulation - will CTM for bleeding - Continue dressings and enzymatic debridement with Santyl - Will f/u in 2-3 weeks with vascular surgery for wound check  ESRD on dialysis  Calciphylaxis  Hypotension: MWF dialyzer. Clotted system at start of HD on 10/16. VVS consulted to assess access and need for shuntogram. No stenosis or blockage seen on shuntogram. Renal continues to follow, appreciate recs.  -  MWF dialyzer, last HD 10/16. - s/p LUE fistulogram today; left arm hero graft widely patent w/o evidence of thrombus - midodrine per above - phoslo 1334mg  tid and cinacalcet 90g mwf - daily rfp  Afib Rate controlled.  - Telemetry - Will restart Eliquis on discharge  Pulmonary HTN, chronic:  O2 sats remain WNLs on RA without any oxygen requirements. - Continue cialis and ambrisentan - Continue home midodrine  Thyroid cancer s/p thyroidectomy: Chronic. Stable TSH.    - Continue Synthroid 300 mcg daily  Anemia of CKD: Chronic. Hgb stable at 8.2. Pt remains without signs of active bleeding (last episode was 10/9)  --Transfusion threshold 7.0. - Monitoring daily CBC  Hyperlipidemia, chronic: Chronic. Stable. - Continue Lipitor 40mg   FEN/GI:Renal diet Prophylaxis:SCDs  Disposition: pending clinically improvement  Subjective:  Doing well this morning. Ready to go home.  Objective: Temp:  [97.4 F (36.3 C)-98.4 F (36.9 C)] 97.4 F (36.3 C) (10/17 0618) Pulse Rate:  [71-82] 71 (10/17 0618) Resp:  [16-19] 19 (10/16 2200) BP: (74-92)/(17-59) 83/17 (10/17 0618) SpO2:  [96 %-100 %] 97 % (10/17 0736) Weight:  [121.1 kg-123.9 kg] 121.1 kg (10/16 1335) Physical Exam: General: Obese man in bed, no signs of distress.  Cardiovascular: 4/6 holosystolic murmur, normal U1/T1, no rubs or gallops 4 Lungs: Frontal lung fields CTAB without wheezes or crackles, no signs of respiratory distress. Abdomen: nontender, soft, nondistended, obese. Skin: Right flank dressing in place, dry, no signs of pus or erythema surrounding wound. Left groin dressing in place an dry.  Extremities: Able to move w/o difficulty.   Laboratory: Recent Labs  Lab 02/22/18 0443 02/23/18 0610 02/24/18 0743  WBC 8.1 8.3 9.8  HGB 8.5* 9.3* 8.2*  HCT 28.9* 31.7* 27.1*  PLT 133* 112* 86*   Recent Labs  Lab 02/22/18 0443 02/23/18 0610 02/24/18 0743  NA 136 137 134*  K 3.7 4.2 5.1  CL 98 100 98  CO2 28 26 24   BUN 30* 20 29*  CREATININE 5.07* 3.82* 4.82*  CALCIUM 8.9 8.3* 8.5*  GLUCOSE 80 74 99   Imaging/Diagnostic Tests: PROCEDURE: 1. left arm HERO graft arteriovenous cannulation under ultrasound guidance 2. left arm fistulogram  PRE-OPERATIVE DIAGNOSIS: Malfunctioning left HERO graft  POST-OPERATIVE DIAGNOSIS: same as above   FINDING(S): 1. The left arm hero graft appeared widely patent.  I saw no evidence of anastomotic stenosis to the brachial artery.   The entire graft appeared widely patent and was properly positioned in the right atrium with no overt evidence of thrombus around the distal catheter in the right atrium.   Adam [name], Adam [name], Medical Student 02/25/2018, 7:59 AM FPTS Intern pager: [phone], text pages welcome ------------------------------------------------------------------------------------------- Upper Level Addendum: I have seen and evaluated this patient along with Adam Peterson MS-3 and reviewed the above note, making necessary revisions in brown.  Guadalupe Dawn MD PGY-2 Family Medicine Resident

## 2018-02-25 NOTE — Progress Notes (Signed)
[facility]  Subjective  - No acute events overnight.  Completed dialysis yesterday with LUE HERO graft - states clots pulled from graft.    Objective (!) 83/17 71 (!) 97.4 F (36.3 C) (Oral) 19 97%  Intake/Output Summary (Last 24 hours) at 02/25/2018 0732 Last data filed at 02/24/2018 1335 Gross per 24 hour  Intake -  Output 2800 ml  Net -2800 ml    General NAD Left arm hero graft - no active bleeding, weak thrill  No tissue loss in left hand  Laboratory Lab Results: Recent Labs    02/23/18 0610 02/24/18 0743  WBC 8.3 9.8  HGB 9.3* 8.2*  HCT 31.7* 27.1*  PLT 112* 86*   BMET Recent Labs    02/23/18 0610 02/24/18 0743  NA 137 134*  K 4.2 5.1  CL 100 98  CO2 26 24  GLUCOSE 74 99  BUN 20 29*  CREATININE 3.82* 4.82*  CALCIUM 8.3* 8.5*    COAG Lab Results  Component Value Date   INR 1.27 02/22/2018   INR 1.21 01/12/2018   INR 1.32 12/22/2017   No results found for: PTT  Assessment/Planning:  Plan for left upper extremity fistulogram today.  Discussed with patient limited options for intervention in PV lab given HERO graft but can obtain diagnostic images and make decision moving forward.     Marty Heck, MD [facility] Office: [phone] Pager: [place]

## 2018-02-26 DIAGNOSIS — D509 Iron deficiency anemia, unspecified: Secondary | ICD-10-CM | POA: Diagnosis not present

## 2018-02-26 DIAGNOSIS — N186 End stage renal disease: Secondary | ICD-10-CM | POA: Diagnosis not present

## 2018-02-26 DIAGNOSIS — N2581 Secondary hyperparathyroidism of renal origin: Secondary | ICD-10-CM | POA: Diagnosis not present

## 2018-02-26 DIAGNOSIS — D631 Anemia in chronic kidney disease: Secondary | ICD-10-CM | POA: Diagnosis not present

## 2018-02-26 DIAGNOSIS — E8770 Fluid overload, unspecified: Secondary | ICD-10-CM | POA: Diagnosis not present

## 2018-02-26 LAB — CBC WITH DIFFERENTIAL/PLATELET
Abs Immature Granulocytes: 0.16 10*3/uL — ABNORMAL HIGH (ref 0.00–0.07)
BASOS ABS: 0 10*3/uL (ref 0.0–0.1)
BASOS PCT: 0 %
EOS ABS: 0 10*3/uL (ref 0.0–0.5)
EOS PCT: 0 %
HCT: 28.5 % — ABNORMAL LOW (ref 39.0–52.0)
HEMOGLOBIN: 8.4 g/dL — AB (ref 13.0–17.0)
IMMATURE GRANULOCYTES: 2 %
Lymphocytes Relative: 6 %
Lymphs Abs: 0.6 10*3/uL — ABNORMAL LOW (ref 0.7–4.0)
MCH: 27.5 pg (ref 26.0–34.0)
MCHC: 29.5 g/dL — AB (ref 30.0–36.0)
MCV: 93.4 fL (ref 80.0–100.0)
Monocytes Absolute: 0.5 10*3/uL (ref 0.1–1.0)
Monocytes Relative: 5 %
NEUTROS PCT: 87 %
NRBC: 0.3 % — AB (ref 0.0–0.2)
Neutro Abs: 8.8 10*3/uL — ABNORMAL HIGH (ref 1.7–7.7)
PLATELETS: 143 10*3/uL — AB (ref 150–400)
RBC: 3.05 MIL/uL — AB (ref 4.22–5.81)
RDW: 23.2 % — AB (ref 11.5–15.5)
WBC: 10 10*3/uL (ref 4.0–10.5)

## 2018-02-26 LAB — RENAL FUNCTION PANEL
ALBUMIN: 2.4 g/dL — AB (ref 3.5–5.0)
ANION GAP: 12 (ref 5–15)
BUN: 34 mg/dL — AB (ref 6–20)
CALCIUM: 8.7 mg/dL — AB (ref 8.9–10.3)
CO2: 25 mmol/L (ref 22–32)
Chloride: 97 mmol/L — ABNORMAL LOW (ref 98–111)
Creatinine, Ser: 5.28 mg/dL — ABNORMAL HIGH (ref 0.61–1.24)
GFR calc Af Amer: 13 mL/min — ABNORMAL LOW (ref >60)
GFR calc non Af Amer: 11 mL/min — ABNORMAL LOW (ref >60)
GLUCOSE: 99 mg/dL (ref 70–99)
Phosphorus: 2.8 mg/dL (ref 2.5–4.6)
Potassium: 4.7 mmol/L (ref 3.5–5.1)
Sodium: 134 mmol/L — ABNORMAL LOW (ref 135–145)

## 2018-02-26 MED ORDER — PANTOPRAZOLE SODIUM 40 MG PO TBEC: 40.0000 mg | DELAYED_RELEASE_TABLET | Freq: Two times a day (BID) | ORAL | 1 refills | Status: AC

## 2018-02-26 NOTE — Care Management Note (Addendum)
Case Management Note  Patient Details  Name: Adam [name]. MRN: [id number] Date of Birth: [birth year]  Subjective/Objective:      Sepsis.  h/o SIRS with hypotension and fever. PMH is significant forESRD on dialysis MWF, paroxymal atrial fibrillationon eliquis, pulmonary hypertension, aortic stenosis, s/p thyroidectomy in 2011, and calciphylaxis. Resides with wife/family.States independent with ADL's, no DME usage. PTA to admit active with [facility] [employer]) for home services.   s/p debridement of right flank abscess on 10/15    [id number] (Spouse) Tommy Medal (Sister)     [phone]      Action/Plan: Transition to home with home health services to follow.  Pt states has transportation to home.  Expected Discharge Date:  02/25/18               Expected Discharge Plan:  [place]  In-House Referral:     Discharge planning Services  CM Consult  Post Acute Care Choice:    Choice offered to:  Patient  DME Arranged:  N/A DME Agency:  NA  HH Arranged:  RN [place] Agency:  [place] Wound assessment, [place] wound care protocol, disease management Status of Service:  Completed, signed off  If discussed at Long Length of Stay Meetings, dates discussed:    Additional Comments:  Sharin Mons, RN 02/26/2018, 7:51 AM

## 2018-02-26 NOTE — Progress Notes (Signed)
Adam [name]. to be D/C'd home with home health per MD order. Discussed with the patient and all questions fully answered. VVS, Skin clean and dry. IV catheter discontinued intact. Site without signs and symptoms of complications. Dressing and pressure applied. Dressings to groin, side, and back were changed prior to discharge. An After Visit Summary was printed and given to the patient.  Patient escorted via [place], and D/C home via private auto.  Adam [name]  02/26/2018 11:37 AM

## 2018-02-26 NOTE — Progress Notes (Signed)
1 Day Post-Op   Subjective/Chief Complaint: Doing well con't with dressing changes   Objective: Vital signs in last 24 hours: Temp:  [97.5 F (36.4 C)-97.8 F (36.6 C)] 97.5 F (36.4 C) (10/18 0611) Pulse Rate:  [74-83] 74 (10/18 0611) Resp:  [16-18] 16 (10/18 0611) BP: (78-95)/(48-62) 78/48 (10/18 0611) SpO2:  [96 %-100 %] 100 % (10/18 7322) Last BM Date: 02/23/18  Intake/Output from previous day: No intake/output data recorded. Intake/Output this shift: No intake/output data recorded.  General appearance: alert and cooperative Incision/Wound: inc are c/d/i, clean base  Lab Results:  Recent Labs    02/24/18 0743 02/26/18 0313  WBC 9.8 10.0  HGB 8.2* 8.4*  HCT 27.1* 28.5*  PLT 86* 143*   BMET Recent Labs    02/24/18 0743 02/26/18 0313  NA 134* 134*  K 5.1 4.7  CL 98 97*  CO2 24 25  GLUCOSE 99 99  BUN 29* 34*  CREATININE 4.82* 5.28*  CALCIUM 8.5* 8.7*   PT/INR No results for input(s): LABPROT, INR in the last 72 hours. ABG No results for input(s): PHART, HCO3 in the last 72 hours.  Invalid input(s): PCO2, PO2  Studies/Results: No results found.  Anti-infectives: Anti-infectives (From admission, onward)   Start     Dose/Rate Route Frequency Ordered Stop   02/26/18 1200  levofloxacin (LEVAQUIN) tablet 500 mg     500 mg Oral Every 48 hours 02/22/18 1135 03/04/18 1159   02/26/18 0000  levofloxacin (LEVAQUIN) 500 MG tablet     500 mg Oral Every 48 hours 02/25/18 1417     02/24/18 1200  levofloxacin (LEVAQUIN) tablet 750 mg     750 mg Oral  Once 02/22/18 1135 02/24/18 1524   02/23/18 1200  levofloxacin (LEVAQUIN) tablet 750 mg  Status:  Discontinued     750 mg Oral  Once 02/22/18 1124 02/22/18 1135   02/23/18 1130  doxycycline (VIBRAMYCIN) 50 MG capsule 50 mg  Status:  Discontinued     50 mg Oral Every 12 hours 02/23/18 1115 02/23/18 1115   02/22/18 1200  vancomycin (VANCOCIN) IVPB 1000 mg/200 mL premix  Status:  Discontinued     1,000 mg 200 mL/hr  over 60 Minutes Intravenous Every M-W-F (Hemodialysis) 02/20/18 1424 02/21/18 1226   02/22/18 1130  levofloxacin (LEVAQUIN) tablet 750 mg  Status:  Discontinued     750 mg Oral Daily 02/22/18 1120 02/22/18 1122   02/22/18 1130  ceFEPIme (MAXIPIME) 1 g in sodium chloride 0.9 % 100 mL IVPB  Status:  Discontinued     1 g 200 mL/hr over 30 Minutes Intravenous Every 24 hours 02/22/18 1123 02/22/18 1124   02/22/18 1130  ceFEPIme (MAXIPIME) 1 g in sodium chloride 0.9 % 100 mL IVPB     1 g 200 mL/hr over 30 Minutes Intravenous Every 24 hours 02/22/18 1124 02/22/18 1902   02/20/18 1430  vancomycin (VANCOCIN) IVPB 1000 mg/200 mL premix     1,000 mg 200 mL/hr over 60 Minutes Intravenous  Once 02/20/18 1422 02/20/18 1827   02/19/18 2000  ceFEPIme (MAXIPIME) 1 g in sodium chloride 0.9 % 100 mL IVPB  Status:  Discontinued     1 g 200 mL/hr over 30 Minutes Intravenous Every 24 hours 02/19/18 1915 02/22/18 1120   02/19/18 1200  vancomycin (VANCOCIN) IVPB 1000 mg/200 mL premix  Status:  Discontinued     1,000 mg 200 mL/hr over 60 Minutes Intravenous Every M-W-F (Hemodialysis) 02/18/18 1139 02/19/18 1914   02/18/18 0730  vancomycin (  VANCOCIN) IVPB 1000 mg/200 mL premix  Status:  Discontinued     1,000 mg 200 mL/hr over 60 Minutes Intravenous  Once 02/18/18 0717 02/18/18 0722   02/18/18 0730  cefTRIAXone (ROCEPHIN) 2 g in sodium chloride 0.9 % 100 mL IVPB  Status:  Discontinued     2 g 200 mL/hr over 30 Minutes Intravenous Every 24 hours 02/18/18 0717 02/19/18 1845   02/18/18 0730  vancomycin (VANCOCIN) 2,000 mg in sodium chloride 0.9 % 500 mL IVPB     2,000 mg 250 mL/hr over 120 Minutes Intravenous  Once 02/18/18 0723 02/18/18 1052      Assessment/Plan: s/p Procedure(s) with comments: A/V SHUNTOGRAM (N/A) - lt arm fistula COnt' dressing changes  Will follow up in next 1-2weeks  LOS: 8 days    Ralene Ok 02/26/2018

## 2018-02-26 NOTE — Plan of Care (Addendum)
Patient is making progress toward discharge, alert and oriented, no signs and symptoms of infection. Patient scheduled for discharge this AM with home health.    Case manager notified via pager for home health set-up for patient before discharge

## 2018-02-26 NOTE — Discharge Instructions (Signed)
Mechanical Wound Debridement Pack open wounds with Kerlix, wet with sterile saline Twice a day. Mechanical wound debridement is a treatment to remove dead tissue from a wound. This helps the wound heal. The treatment involves cleaning the wound (irrigation) and using a pad or gauze (dressing) to remove dead tissue and debris from the wound. There are different types of mechanical wound debridement. Depending on the wound, you may need to repeat this procedure or change to another form of debridement as your wound starts to heal. Tell a health care provider about:  Any allergies you have.  All medicines you are taking, including vitamins, herbs, eye drops, creams, and over-the-counter medicines.  Any blood disorders you have.  Any medical conditions you have, including any conditions that: ? Cause a significant decrease in blood circulation to the part of the body where the wound is, such as peripheral vascular disease. ? Compromise your defense (immune) system or white blood count.  Any surgeries you have had.  Whether you are pregnant or may be pregnant. What are the risks? Generally, this is a safe procedure. However, problems may occur, including:  Infection.  Bleeding.  Damage to healthy tissue in and around your wound.  Soreness or pain.  Failure of the wound to heal.  Scarring.  What happens before the procedure? You may be given antibiotic medicine to help prevent infection. What happens during the procedure?  Your health care provider may apply a numbing medicine (topical anesthetic) to the wound.  Your health care provider will irrigate your wound with a germ-free (sterile), salt-water (saline) solution. This removes debris, bacteria, and dead tissue.  Depending on what type of mechanical wound debridement you are having, your health care provider may do one of the following: ? Put a dressing on your wound. You may have dry gauze pad placed into the wound. Your  health care provider will remove the gauze after the wound is dry. Any dead tissue and debris that has dried into the gauze will be lifted out of the wound (wet-to-dry debridement). ? Use a type of pad (monofilament fiber debridement pad). This pad has a fluffy surface on one side that picks up dead tissue and debris from your wound. Your health care provider wets the pad and wipes it over your wound for several minutes. ? Irrigate your wound with a pressurized stream of solution such as saline or water.  Once your health care provider is finished, he or she may apply a light dressing to your wound. The procedure may vary among health care providers and hospitals. What happens after the procedure?  You may receive medicine for pain.  You will continue to receive antibiotic medicine if it was started before your procedure. This information is not intended to replace advice given to you by your health care provider. Make sure you discuss any questions you have with your health care provider. Document Released: 01/17/2015 Document Revised: 10/04/2015 Document Reviewed: 09/06/2014 Elsevier Interactive Patient Education  [employer].

## 2018-02-26 NOTE — Progress Notes (Signed)
Pt. Said the wound care does not need to be done this morning. He verbalized he is good, wound assessed, dressing clean, dry  and intact.

## 2018-02-28 DIAGNOSIS — D631 Anemia in chronic kidney disease: Secondary | ICD-10-CM | POA: Diagnosis not present

## 2018-02-28 DIAGNOSIS — I9589 Other hypotension: Secondary | ICD-10-CM | POA: Diagnosis not present

## 2018-02-28 DIAGNOSIS — I700 Atherosclerosis of aorta: Secondary | ICD-10-CM | POA: Diagnosis not present

## 2018-02-28 DIAGNOSIS — I120 Hypertensive chronic kidney disease with stage 5 chronic kidney disease or end stage renal disease: Secondary | ICD-10-CM | POA: Diagnosis not present

## 2018-02-28 DIAGNOSIS — I080 Rheumatic disorders of both mitral and aortic valves: Secondary | ICD-10-CM | POA: Diagnosis not present

## 2018-02-28 DIAGNOSIS — I480 Paroxysmal atrial fibrillation: Secondary | ICD-10-CM | POA: Diagnosis not present

## 2018-02-28 DIAGNOSIS — I255 Ischemic cardiomyopathy: Secondary | ICD-10-CM | POA: Diagnosis not present

## 2018-02-28 DIAGNOSIS — T8189XD Other complications of procedures, not elsewhere classified, subsequent encounter: Secondary | ICD-10-CM | POA: Diagnosis not present

## 2018-02-28 DIAGNOSIS — N186 End stage renal disease: Secondary | ICD-10-CM | POA: Diagnosis not present

## 2018-03-01 ENCOUNTER — Ambulatory Visit: Payer: Medicare HMO | Admitting: Family Medicine

## 2018-03-02 ENCOUNTER — Ambulatory Visit: Payer: Medicare HMO | Admitting: Podiatry

## 2018-03-02 DIAGNOSIS — T829XXA Unspecified complication of cardiac and vascular prosthetic device, implant and graft, initial encounter: Secondary | ICD-10-CM | POA: Diagnosis not present

## 2018-03-02 DIAGNOSIS — D631 Anemia in chronic kidney disease: Secondary | ICD-10-CM | POA: Diagnosis not present

## 2018-03-02 DIAGNOSIS — N186 End stage renal disease: Secondary | ICD-10-CM | POA: Diagnosis not present

## 2018-03-02 DIAGNOSIS — T8189XA Other complications of procedures, not elsewhere classified, initial encounter: Secondary | ICD-10-CM | POA: Diagnosis not present

## 2018-03-02 DIAGNOSIS — T8130XA Disruption of wound, unspecified, initial encounter: Secondary | ICD-10-CM | POA: Diagnosis not present

## 2018-03-02 DIAGNOSIS — D509 Iron deficiency anemia, unspecified: Secondary | ICD-10-CM | POA: Diagnosis not present

## 2018-03-02 DIAGNOSIS — N2581 Secondary hyperparathyroidism of renal origin: Secondary | ICD-10-CM | POA: Diagnosis not present

## 2018-03-02 DIAGNOSIS — J9601 Acute respiratory failure with hypoxia: Secondary | ICD-10-CM | POA: Diagnosis not present

## 2018-03-02 DIAGNOSIS — E8770 Fluid overload, unspecified: Secondary | ICD-10-CM | POA: Diagnosis not present

## 2018-03-02 DIAGNOSIS — Z95810 Presence of automatic (implantable) cardiac defibrillator: Secondary | ICD-10-CM | POA: Diagnosis not present

## 2018-03-02 DIAGNOSIS — R7881 Bacteremia: Secondary | ICD-10-CM | POA: Diagnosis not present

## 2018-03-02 DIAGNOSIS — A419 Sepsis, unspecified organism: Secondary | ICD-10-CM | POA: Diagnosis not present

## 2018-03-03 ENCOUNTER — Telehealth: Payer: Self-pay | Admitting: Pulmonary Disease

## 2018-03-03 ENCOUNTER — Encounter: Payer: Self-pay | Admitting: Pulmonary Disease

## 2018-03-03 ENCOUNTER — Ambulatory Visit: Payer: Medicare HMO | Admitting: Family Medicine

## 2018-03-03 ENCOUNTER — Ambulatory Visit (INDEPENDENT_AMBULATORY_CARE_PROVIDER_SITE_OTHER): Payer: Medicare HMO | Admitting: Pulmonary Disease

## 2018-03-03 DIAGNOSIS — D631 Anemia in chronic kidney disease: Secondary | ICD-10-CM | POA: Diagnosis not present

## 2018-03-03 DIAGNOSIS — Z992 Dependence on renal dialysis: Secondary | ICD-10-CM | POA: Diagnosis not present

## 2018-03-03 DIAGNOSIS — I272 Pulmonary hypertension, unspecified: Secondary | ICD-10-CM

## 2018-03-03 DIAGNOSIS — N186 End stage renal disease: Secondary | ICD-10-CM

## 2018-03-03 DIAGNOSIS — N2581 Secondary hyperparathyroidism of renal origin: Secondary | ICD-10-CM | POA: Diagnosis not present

## 2018-03-03 DIAGNOSIS — D509 Iron deficiency anemia, unspecified: Secondary | ICD-10-CM | POA: Diagnosis not present

## 2018-03-03 DIAGNOSIS — L089 Local infection of the skin and subcutaneous tissue, unspecified: Secondary | ICD-10-CM | POA: Diagnosis not present

## 2018-03-03 DIAGNOSIS — E8770 Fluid overload, unspecified: Secondary | ICD-10-CM | POA: Diagnosis not present

## 2018-03-03 DIAGNOSIS — T148XXA Other injury of unspecified body region, initial encounter: Secondary | ICD-10-CM

## 2018-03-03 NOTE — Telephone Encounter (Signed)
03/03/18 [place] cardiology office to notify them of patient's worsened echo.  Patient has scheduled appointment on 03/17/2018 with Dr. Dellia Nims cream.  Suggested office that he may need a earlier appointment to be evaluated.  Cardiology staff reporting they will get patient scheduled on 03/08/2018 or sooner with NP or PA for further work-up due to worsened echocardiogram.   Wyn Quaker FNP [facility]

## 2018-03-03 NOTE — Patient Instructions (Addendum)
Contact your Cardiologist to be see ASAP due to recent echo changes   You may need a heart cath   Contact our office if you have any difficulties obtaining appointment with your cardiologist  Follow-up with our office in 2 to 4 weeks  Continue pulmonary hypertension meds as prescribed  If your symptoms worsen or you have worsening shortness of breath please present to the emergency room       November/2019 we will be moving! We will no longer be at our [place] location.  Be on the look out for a post card/mailer to let you know we have officially moved.  Our new address and phone number will be:  [address] [place], [place] [address] Telephone number: [phone]  It is flu season:   >>>Remember to be washing your hands regularly, using hand sanitizer, be careful to use around herself with has contact with people who are sick will increase her chances of getting sick yourself. >>> Best ways to protect herself from the flu: Receive the yearly flu vaccine, practice good hand hygiene washing with soap and also using hand sanitizer when available, eat a nutritious meals, get adequate rest, hydrate appropriately   Please contact the office if your symptoms worsen or you have concerns that you are not improving.   Thank you for choosing [facility] for your healthcare, and for allowing [place] to partner with you on your healthcare journey. I am thankful to be able to provide care to you today.   Wyn Quaker FNP-C

## 2018-03-03 NOTE — Assessment & Plan Note (Addendum)
Contact your Cardiologist to be see ASAP due to recent echo changes   You may need a heart cath   Contact our office if you have any difficulties obtaining appointment with your cardiologist  Follow-up with our office in 2 to 4 weeks  Continue pulmonary hypertension meds as prescribed >>>continue ambrisentan  >>>continue tadalafil  If your symptoms worsen or you have worsening shortness of breath please present to the emergency room

## 2018-03-03 NOTE — Progress Notes (Signed)
Reviewed, agree 

## 2018-03-03 NOTE — Assessment & Plan Note (Signed)
Continue Monday Wednesday Friday dialysis

## 2018-03-03 NOTE — Assessment & Plan Note (Signed)
Continue wound follow-up with surgical team

## 2018-03-03 NOTE — Progress Notes (Signed)
@Patient  ID: Adam Alm., male    DOB: 07-17-1960, 57 y.o.   MRN: [id number]  Chief Complaint  Patient presents with  . Follow-up    echo results, recent hosp    Referring provider: [name], MD  HPI:  57 year old male patient followed in our office for pulmonary hypertension (diagnosed in 2012).   PMH: Hypertensive glomerular nephritis, kidney transplant 2000, kidney transplant failed in 2009, patient is Monday Wednesday Friday dialysis, A. fib Smoker/ Smoking History: Never Smoker  Maintenance: Letairis, Adcirca Pt of: Dr. Lake Bells  Recent [facility] Encounters:   02/11/2018-office visit-[place] Patient with increased shortness of breath this is new and worsening. Plan: D-dimer, lower extremity Doppler, CTA of your chest if d-dimer positive, echocardiogram stat, if all these tests are unimpressive we will order PFT, continue Letairis and Adcirca, follow-up next week  02/17/2018  - Visit - BM   57 year old male patient presenting today for one-week follow-up visit after seeing Dr. Lake Bells.  Patient's d-dimer was elevated patient completed CT Angio.  We will discuss these results today.  Unfortunately patient has not completed his echocardiogram that was ordered stat from last office visit. Patient reports that since May/2019 when he had a staph infection which required to significant hospitalizations and procedures patient is not fully recovered.  He reports that prior to that patient was doing well and was quite active.  Patient now has concerns that even after walking 10 feet he gets progressively more short of breath. Patient reports that he has significant lower extremity swelling, especially in left lower extremity.  Patient is being followed by surgery Dr. [name] for left leg wound.  Patient will see Dr. [name] tomorrow for this.  Patient reports adherence to Monday Wednesday Friday dialysis.  Patient reports that he is actually below his dry weight and they  have not had issues with pulling fluid off. Of note patient was confused he has not had blood cultures drawn.  Patient has had blood work completed.  Most recent being a CBC.  Showing stable anemia.  Plan: echo  03/03/2018  - Visit   57 year old male patient presenting today for 2-week follow-up visit.  Patient has been seen in the hospital for known leg wound and elevated lactate.  Patient continues to have increased shortness of breath.  Patient has completed echocardiogram on 02/19/2018.  Echocardiogram showing reduced ejection fraction 30-35 percent.   Pt continues to complete MWF HD.    Tests:  02/19/2018-echocardiogram-LV ejection fraction 30 to 35%, diffuse hypokinesis, grade 1 diastolic dysfunction PA P pressure 61  >>>this is a significant change from April/2019 echocardiogram which had an LV ejection fraction of 60 to 65%  02/16/2018-CT Angio - no evidence of PE, mild interstitial pulmonary edema, small right pleural effusion and enlarged heart, small pericardial effusion  02/11/2018- vascular lower extremity Doppler normal  6MW March 2017 6 minute walk test at [facility] 385 m, O2 saturation 100% on room air  Echo: March 2017 echocardiogram [facility] RVSP 40 mmHg, LVEF 55% March 2018 echo [facility]: RVSP 17mm Hg 08/2017 Echo RVSP 69mmHg  RHC April 2017 right heart catheterization [facility] cardiac index 3.7, right atrial pressure 5, RVSP 46, mean PA pressure 31, pulmonary capillary wedge pressure 11, pulmonary vascular resistance 2.2  Imaging: Ct Angio Chest W/cm &/or Wo Cm  FENO:  No results found for: NITRICOXIDE  PFT: No flowsheet data found.  Imaging: Ct Angio Chest W/cm &/or Wo Cm  Result Date: 02/16/2018 CLINICAL DATA:  57 year old  male with shortness of breath for the past 6 weeks. Elevated D-dimer. Negative Doppler interrogation of his legs. EXAM: CT ANGIOGRAPHY CHEST WITH CONTRAST TECHNIQUE: Multidetector CT imaging of the chest was performed  using the standard protocol during bolus administration of intravenous contrast. Multiplanar CT image reconstructions and MIPs were obtained to evaluate the vascular anatomy. CONTRAST:  145mL ISOVUE-370 IOPAMIDOL (ISOVUE-370) INJECTION 76% COMPARISON:  None. FINDINGS: Cardiovascular: No filling defects in the pulmonary arterial tree to suggest underlying pulmonary embolism. Heart size is mildly enlarged with right atrial dilatation. Small amount of pericardial fluid and/or thickening, most evident adjacent to the right atrium, unlikely to be of any hemodynamic significance at this time. No associated pericardial calcification. There is aortic atherosclerosis, as well as atherosclerosis of the great vessels of the mediastinum and the coronary arteries, including calcified atherosclerotic plaque in the left main, left anterior descending, left circumflex and right coronary arteries. Severe calcifications of the aortic valve and mitral annulus. Mediastinum/Nodes: Several prominent borderline enlarged mediastinal lymph nodes are noted, most evident in the superior mediastinum. Esophagus is unremarkable in appearance. No axillary lymphadenopathy. Lungs/Pleura: Trace right pleural effusion lying dependently. No left pleural effusion. Widespread ground-glass attenuation and mild patchy interlobular septal thickening throughout the lungs bilaterally, favored to reflect a background of mild interstitial pulmonary edema. No consolidative airspace disease. No definite suspicious appearing pulmonary nodules or masses are noted. Upper Abdomen: Aortic atherosclerosis. Musculoskeletal: There are no aggressive appearing lytic or blastic lesions noted in the visualized portions of the skeleton. Review of the MIP images confirms the above findings. IMPRESSION: 1. No evidence of pulmonary embolism. 2. Findings in the lungs suggests mild interstitial pulmonary edema. Given the small right pleural effusion and enlarged heart, findings  are favored to reflect congestive heart failure. 3. Cardiomegaly with right atrial dilatation. 4. Small pericardial effusion most evident over in the right atrium, unlikely to be of any hemodynamic significance at this time. 5. There are calcifications of the aortic valve and mitral annulus. Echocardiographic correlation for evaluation of potential valvular dysfunction may be warranted if clinically indicated. 6. Aortic atherosclerosis, in addition to left main and 3 vessel coronary artery disease. Please note that although the presence of coronary artery calcium documents the presence of coronary artery disease, the severity of this disease and any potential stenosis cannot be assessed on this non-gated CT examination. Assessment for potential risk factor modification, dietary therapy or pharmacologic therapy may be warranted, if clinically indicated. Aortic Atherosclerosis (ICD10-I70.0). Electronically Signed   By: Vinnie Langton M.D.   On: 02/16/2018 16:23   Dg Chest Port 1 View  Result Date: 02/18/2018 CLINICAL DATA:  Fever, chills, shortness of breath, open leg wound, history atrial fibrillation, thyroid cancer, end-stage renal disease on dialysis, non ischemic cardiomyopathy, pulmonary hypertension EXAM: PORTABLE CHEST 1 VIEW COMPARISON:  Portable exam 0714 hours compared to 11/10/2017 FINDINGS: Large bore LEFT jugular dialysis catheter with tip projecting over RIGHT atrium. Enlargement of cardiac silhouette with pulmonary vascular congestion. Minimal accentuation of perihilar interstitial markings likely reflects mild chronic failure. No acute infiltrates, pleural effusion or pneumothorax. Vascular stents identified at the LEFT axilla into LEFT upper extremity. Atherosclerotic calcification aorta. IMPRESSION: Enlargement of cardiac silhouette with pulmonary vascular congestion and minimal chronic failure. No new infiltrates identified. Electronically Signed   By: Lavonia Dana M.D.   On: 02/18/2018 07:49     Chart Review:  02/18/18 - Hospitalization - Leg wound    Specialty Problems      Pulmonary Problems   Sinus  congestion   Epistaxis    02/2016 ENT assessment> perforated nasal septum      HCAP (healthcare-associated pneumonia)   Acute respiratory failure with hypoxia (HCC)   Shortness of breath      Allergies  Allergen Reactions  . Enalapril Swelling and Other (See Comments)    MOUTH SWELLING EDEMA  . Iodinated Diagnostic Agents Nausea And Vomiting and Nausea Only    Immunization History  Administered Date(s) Administered  . Influenza Split 01/30/2014, 02/19/2016, 12/27/2016  . Influenza Whole 02/10/2015  . Influenza-Unspecified 01/12/2017, 02/08/2018  . Pneumococcal Polysaccharide-23 05/29/2014    Past Medical History:  Diagnosis Date  . Abscess 02/2018   LEFT THIGH   . AICD (automatic cardioverter/defibrillator) present    [employer] - s/p extraction 08/21/17 due to staph infection  . Anemia   . Aortic stenosis    moderate AS by 08/2016 echo  . Arthritis    "knees" (08/05/2016)  . Atrial fibrillation ([place])   . ESRD (end stage renal disease) on dialysis [facility])    ESRD due to HTN started HD 2003, had renal Tx 2009 for 18 mos. Was on NX stage hemodialysis as of 2018 but in late 2019 is back at [facility] incenter HD.   Marland Kitchen Great toe amputation status    status post left hallux amputation 08/01/14  . History of blood transfusion 2009   "S/P biopsy for prostate cancer check"  . Hypothyroidism (acquired)   . Mitral stenosis    moderate mitral stenosis  . Nonhealing surgical wound    left thigh  . Nonischemic cardiomyopathy ([place])   . Pneumonia 2014; 08/05/2016  . Pulmonary HTN ([place])   . Renal insufficiency   . Thyroid cancer ([place]) 2011    Tobacco History: Social History   Tobacco Use  Smoking Status Never Smoker  Smokeless Tobacco Never Used   Counseling given: Not Answered  Continue to not smoke  Outpatient Encounter Medications as of 03/03/2018    Medication Sig  . albuterol (PROVENTIL) (2.5 MG/3ML) 0.083% nebulizer solution Take 3 mLs (2.5 mg total) by nebulization every 6 (six) hours as needed for wheezing or shortness of breath.  Marland Kitchen ambrisentan (LETAIRIS) 10 MG tablet Take 1 tablet (10 mg total) by mouth daily.  Marland Kitchen amiodarone (PACERONE) 200 MG tablet Take 200 mg by mouth daily.   Marland Kitchen apixaban (ELIQUIS) 2.5 MG TABS tablet Take 1 tablet (2.5 mg total) by mouth 2 (two) times daily.  Marland Kitchen atorvastatin (LIPITOR) 40 MG tablet Take 1 tablet (40 mg total) by mouth every evening.  . cinacalcet (SENSIPAR) 90 MG tablet Take 90 mg by mouth every evening.  Marland Kitchen levothyroxine (SYNTHROID, LEVOTHROID) 300 MCG tablet Take 1 tablet (300 mcg total) by mouth daily before breakfast.  . midodrine (PROAMATINE) 10 MG tablet TAKE 1 TABLET (10 MG TOTAL) BY MOUTH 3 (THREE) TIMES DAILY.  . multivitamin (RENA-VIT) TABS tablet TAKE 1 TABLET BY MOUTH EVERYDAY AT BEDTIME (Patient taking differently: Take 1 tablet by mouth at bedtime. )  . nystatin (MYCOSTATIN/NYSTOP) powder Apply topically 4 (four) times daily. (Patient taking differently: Apply 1 g topically daily. )  . pantoprazole (PROTONIX) 40 MG tablet Take 1 tablet (40 mg total) by mouth 2 (two) times daily.  . tadalafil (ADCIRCA/CIALIS) 20 MG tablet Take 20 mg by mouth daily.   . traMADol (ULTRAM) 50 MG tablet Take 1 tablet (50 mg total) by mouth every 6 (six) hours as needed. (Patient taking differently: Take 50 mg by mouth every 6 (six) hours as needed for  moderate pain. )  . diphenhydrAMINE (BENADRYL) 50 MG tablet Take 1 tablet by mouth 1 hour prior to study (Patient not taking: Reported on 03/03/2018)  . [DISCONTINUED] levofloxacin (LEVAQUIN) 500 MG tablet Take 1 tablet (500 mg total) by mouth every other day. (Patient not taking: Reported on 03/03/2018)  . [DISCONTINUED] predniSONE (DELTASONE) 20 MG tablet Take 2 and 1/2 tablets (50mg ) by mouth 13 hours, 7 hours, and 1 hour prior to study. (Patient not taking:  Reported on 03/03/2018)   No facility-administered encounter medications on file as of 03/03/2018.      Review of Systems  Review of Systems  Constitutional: Positive for activity change and fatigue. Negative for chills, fever and unexpected weight change.  HENT: Negative for congestion, postnasal drip, sinus pressure and sinus pain.   Eyes: Negative.   Respiratory: Positive for shortness of breath. Negative for cough, chest tightness and wheezing.        Pt denies orthopnea  Cardiovascular: Positive for leg swelling (chronic ). Negative for chest pain and palpitations.  Gastrointestinal: Negative for constipation, diarrhea, nausea and vomiting.  Endocrine: Negative.   Genitourinary:       MWF HD  Musculoskeletal: Negative.   Skin: Negative.   Neurological: Negative for dizziness, light-headedness and headaches.  Psychiatric/Behavioral: Negative.  Negative for dysphoric mood. The patient is not nervous/anxious.   All other systems reviewed and are negative.    Physical Exam  BP (!) 88/58 (BP Location: Right Arm, Cuff Size: Normal)   Pulse 87   Ht 6\' 1"  (1.854 m)   Wt 277 lb 6.4 oz (125.8 kg)   SpO2 97%   BMI 36.60 kg/m   Wt Readings from Last 5 Encounters:  03/03/18 277 lb 6.4 oz (125.8 kg)  02/24/18 266 lb 15.6 oz (121.1 kg)  02/17/18 272 lb 12.8 oz (123.7 kg)  02/11/18 272 lb 6.4 oz (123.6 kg)  02/10/18 271 lb (122.9 kg)   Physical Exam  Constitutional: He is oriented to person, place, and time and well-developed, well-nourished, and in no distress. No distress.  +Slight hypotension on OV today   HENT:  Head: Normocephalic and atraumatic.  Right Ear: Hearing, tympanic membrane, external ear and ear canal normal.  Left Ear: Hearing, external ear and ear canal normal.  Nose: Nose normal. Right sinus exhibits no maxillary sinus tenderness and no frontal sinus tenderness. Left sinus exhibits no maxillary sinus tenderness and no frontal sinus tenderness.  Mouth/Throat:  Uvula is midline and oropharynx is clear and moist. No oropharyngeal exudate.  Unable to visualize left TM d/t cerumen  Eyes: Pupils are equal, round, and reactive to light.  Neck: Normal range of motion. Neck supple. No JVD present.  Cardiovascular: Normal rate, regular rhythm and normal heart sounds.  Pulmonary/Chest: Effort normal and breath sounds normal. No accessory muscle usage. No respiratory distress. He has no decreased breath sounds. He has no wheezes. He has no rhonchi.  Musculoskeletal: Normal range of motion. He exhibits edema.  Lymphadenopathy:    He has no cervical adenopathy.  Neurological: He is alert and oriented to person, place, and time. Gait normal.  Skin: Skin is warm and dry. He is not diaphoretic. No erythema.     LE bilaterally dry flaky skin   Psychiatric: Mood, memory, affect and judgment normal.  Nursing note and vitals reviewed.    Lab Results:  CBC    Component Value Date/Time   WBC 10.0 02/26/2018 0313   RBC 3.05 (L) 02/26/2018 0313   HGB 8.4 (  L) 02/26/2018 0313   HGB 8.8 (L) 02/10/2018 1556   HCT 28.5 (L) 02/26/2018 0313   HCT 28.2 (L) 02/10/2018 1556   PLT 143 (L) 02/26/2018 0313   PLT 122 (L) 02/10/2018 1556   MCV 93.4 02/26/2018 0313   MCV 87 02/10/2018 1556   MCH 27.5 02/26/2018 0313   MCHC 29.5 (L) 02/26/2018 0313   RDW 23.2 (H) 02/26/2018 0313   RDW 17.7 (H) 02/10/2018 1556   LYMPHSABS 0.6 (L) 02/26/2018 0313   LYMPHSABS 0.5 (L) 02/10/2018 1556   MONOABS 0.5 02/26/2018 0313   EOSABS 0.0 02/26/2018 0313   EOSABS 0.1 02/10/2018 1556   BASOSABS 0.0 02/26/2018 0313   BASOSABS 0.0 02/10/2018 1556    BMET    Component Value Date/Time   NA 134 (L) 02/26/2018 0313   K 4.7 02/26/2018 0313   CL 97 (L) 02/26/2018 0313   CO2 25 02/26/2018 0313   GLUCOSE 99 02/26/2018 0313   BUN 34 (H) 02/26/2018 0313   CREATININE 5.28 (H) 02/26/2018 0313   CREATININE 4.91 (H) 08/13/2015 1223   CALCIUM 8.7 (L) 02/26/2018 0313   GFRNONAA 11 (L)  02/26/2018 0313   GFRNONAA 13 (L) 07/27/2014 1539   GFRAA 13 (L) 02/26/2018 0313   GFRAA 15 (L) 07/27/2014 1539    BNP No results found for: BNP  ProBNP No results found for: PROBNP    Assessment & Plan:   Pleasant 57 year old male patient presenting today for 2-week follow-up.  I reviewed echocardiogram results with patient today.  Patient with echocardiogram showing reduced ejection fraction.  I will have patient follow-up with cardiology team.  Patient may need further evaluation including a repeat heart cath.  We will continue pulmonary hypertension meds at this time.  Patient has short-term follow-up with our office in 2 to 4 weeks.  Or sooner if symptoms are not improving.  If symptoms worsen patient is to present to the emergency room.  Patient reports that he will contact his cardiologist today to schedule a earlier appointment.  Pulmonary hypertension ([place]) Contact your Cardiologist to be see ASAP due to recent echo changes   You may need a heart cath   Contact our office if you have any difficulties obtaining appointment with your cardiologist  Follow-up with our office in 2 to 4 weeks  Continue pulmonary hypertension meds as prescribed >>>continue ambrisentan  >>>continue tadalafil  If your symptoms worsen or you have worsening shortness of breath please present to the emergency room   ESRD on hemodialysis [facility]) Continue Monday Wednesday Friday dialysis  Wound infection Continue wound follow-up with surgical team  This appointment was 32 minutes along with her 50% the time direct face-to-face patient care, assessment, plan of care follow-up.     Lauraine Rinne, NP 03/03/2018

## 2018-03-04 DIAGNOSIS — Z9884 Bariatric surgery status: Secondary | ICD-10-CM | POA: Diagnosis not present

## 2018-03-04 DIAGNOSIS — Z992 Dependence on renal dialysis: Secondary | ICD-10-CM | POA: Diagnosis not present

## 2018-03-04 DIAGNOSIS — N186 End stage renal disease: Secondary | ICD-10-CM | POA: Diagnosis not present

## 2018-03-04 DIAGNOSIS — M793 Panniculitis, unspecified: Secondary | ICD-10-CM | POA: Diagnosis not present

## 2018-03-05 ENCOUNTER — Other Ambulatory Visit: Payer: Self-pay | Admitting: Gastroenterology

## 2018-03-05 DIAGNOSIS — Z221 Carrier of other intestinal infectious diseases: Secondary | ICD-10-CM | POA: Diagnosis not present

## 2018-03-05 DIAGNOSIS — N2581 Secondary hyperparathyroidism of renal origin: Secondary | ICD-10-CM | POA: Diagnosis not present

## 2018-03-05 DIAGNOSIS — E8770 Fluid overload, unspecified: Secondary | ICD-10-CM | POA: Diagnosis not present

## 2018-03-05 DIAGNOSIS — N186 End stage renal disease: Secondary | ICD-10-CM | POA: Diagnosis not present

## 2018-03-05 DIAGNOSIS — Z8719 Personal history of other diseases of the digestive system: Secondary | ICD-10-CM | POA: Diagnosis not present

## 2018-03-05 DIAGNOSIS — K625 Hemorrhage of anus and rectum: Secondary | ICD-10-CM | POA: Diagnosis not present

## 2018-03-05 DIAGNOSIS — D500 Iron deficiency anemia secondary to blood loss (chronic): Secondary | ICD-10-CM | POA: Diagnosis not present

## 2018-03-05 DIAGNOSIS — D509 Iron deficiency anemia, unspecified: Secondary | ICD-10-CM | POA: Diagnosis not present

## 2018-03-05 DIAGNOSIS — D631 Anemia in chronic kidney disease: Secondary | ICD-10-CM | POA: Diagnosis not present

## 2018-03-06 DIAGNOSIS — E8770 Fluid overload, unspecified: Secondary | ICD-10-CM | POA: Diagnosis not present

## 2018-03-06 DIAGNOSIS — N2581 Secondary hyperparathyroidism of renal origin: Secondary | ICD-10-CM | POA: Diagnosis not present

## 2018-03-06 DIAGNOSIS — N186 End stage renal disease: Secondary | ICD-10-CM | POA: Diagnosis not present

## 2018-03-06 DIAGNOSIS — D631 Anemia in chronic kidney disease: Secondary | ICD-10-CM | POA: Diagnosis not present

## 2018-03-06 DIAGNOSIS — D509 Iron deficiency anemia, unspecified: Secondary | ICD-10-CM | POA: Diagnosis not present

## 2018-03-07 DIAGNOSIS — I480 Paroxysmal atrial fibrillation: Secondary | ICD-10-CM | POA: Diagnosis not present

## 2018-03-07 DIAGNOSIS — I120 Hypertensive chronic kidney disease with stage 5 chronic kidney disease or end stage renal disease: Secondary | ICD-10-CM | POA: Diagnosis not present

## 2018-03-07 DIAGNOSIS — N186 End stage renal disease: Secondary | ICD-10-CM | POA: Diagnosis not present

## 2018-03-07 DIAGNOSIS — D631 Anemia in chronic kidney disease: Secondary | ICD-10-CM | POA: Diagnosis not present

## 2018-03-07 DIAGNOSIS — I700 Atherosclerosis of aorta: Secondary | ICD-10-CM | POA: Diagnosis not present

## 2018-03-07 DIAGNOSIS — I080 Rheumatic disorders of both mitral and aortic valves: Secondary | ICD-10-CM | POA: Diagnosis not present

## 2018-03-07 DIAGNOSIS — I255 Ischemic cardiomyopathy: Secondary | ICD-10-CM | POA: Diagnosis not present

## 2018-03-07 DIAGNOSIS — T8189XD Other complications of procedures, not elsewhere classified, subsequent encounter: Secondary | ICD-10-CM | POA: Diagnosis not present

## 2018-03-07 DIAGNOSIS — I9589 Other hypotension: Secondary | ICD-10-CM | POA: Diagnosis not present

## 2018-03-08 ENCOUNTER — Ambulatory Visit: Payer: Medicare HMO | Admitting: Physician Assistant

## 2018-03-08 ENCOUNTER — Encounter: Payer: Self-pay | Admitting: Physician Assistant

## 2018-03-08 ENCOUNTER — Telehealth: Payer: Self-pay

## 2018-03-08 VITALS — BP 98/60 | HR 102 | Ht 73.0 in | Wt 266.0 lb

## 2018-03-08 DIAGNOSIS — N186 End stage renal disease: Secondary | ICD-10-CM | POA: Diagnosis not present

## 2018-03-08 DIAGNOSIS — I272 Pulmonary hypertension, unspecified: Secondary | ICD-10-CM

## 2018-03-08 DIAGNOSIS — I428 Other cardiomyopathies: Secondary | ICD-10-CM

## 2018-03-08 DIAGNOSIS — N2581 Secondary hyperparathyroidism of renal origin: Secondary | ICD-10-CM | POA: Diagnosis not present

## 2018-03-08 DIAGNOSIS — I350 Nonrheumatic aortic (valve) stenosis: Secondary | ICD-10-CM

## 2018-03-08 DIAGNOSIS — D509 Iron deficiency anemia, unspecified: Secondary | ICD-10-CM | POA: Diagnosis not present

## 2018-03-08 DIAGNOSIS — R0609 Other forms of dyspnea: Secondary | ICD-10-CM | POA: Diagnosis not present

## 2018-03-08 DIAGNOSIS — R0600 Dyspnea, unspecified: Secondary | ICD-10-CM

## 2018-03-08 DIAGNOSIS — E8770 Fluid overload, unspecified: Secondary | ICD-10-CM | POA: Diagnosis not present

## 2018-03-08 DIAGNOSIS — D631 Anemia in chronic kidney disease: Secondary | ICD-10-CM | POA: Diagnosis not present

## 2018-03-08 NOTE — Progress Notes (Signed)
Cardiology Office Note   Date:  03/08/2018   ID:  Adam Alm., DOB 02/05/1961, MRN [id number]  PCP:  [name], MD Cardiologist:  Sanda Klein, MD 09/21/2017 Rosaria Ferries, PA-C 12/03/2017  Chief Complaint  Patient presents with  . Shortness of Breath    with activity    History of Present Illness: Adam [name]. is a 57 y.o. male with a history of  NICM, PAH (Dr [place]), PAD, ESRD on HD, staph bacteremia s/p explantation MDT ICD and extraction (EF improved to 60-65%, no reimplantation planned.) AV fistula, PAF on coumadin and amio, thyroid CA>>thyroidectomy>>thyroid replacement rx  10/23 Pulm PA office visit>>echo w/ EF now 30%>>appt made Phone notes regarding need for colonoscopy>> appointment made.  Adam Alm. presents for cardiology follow up.  ABX from staph infection have ruined his colon. Because of bleeding in his stool and ongoing diarrhea, he needs a colonoscopy.  However, they are going to defer the colonoscopy to give ongoing wounds a chance to heal.  It is tentatively scheduled for December 10.  His dry weight has been lowered at HD. He does not miss appts, they only take off 2-3 kg per treatment.  He has been much more SOB than usual. His stamina is very poor, gets SOB walking room-room. +orthopnea, possibly PND but pt does not wake completely.  During the night, his wife will hear him snore and he will take a sudden deep breath.  She is concerned about this.  LLE has been swollen x 4-5 months. Temp cath removed and became infected, I&D performed multiple times, LLE no longer infected. R- knee has also been infected, but is not swelling.   He still has to take the midodrine to keep his BP up.  Yesterday, SBP was in the 60s, he did not feel well. He took extra midodrine and felt better after an hour or so. His BP has been low since he lost weight and got on HD.  That was several years ago, but it has never come back up.  Right  now, he has no active infection. He has wounds that are healing on his L leg and one on each side.    Past Medical History:  Diagnosis Date  . Abscess 02/2018   LEFT THIGH   . AICD (automatic cardioverter/defibrillator) present    [employer] - s/p extraction 08/21/17 due to staph infection  . Anemia   . Aortic stenosis    moderate AS by 08/2016 echo  . Arthritis    "knees" (08/05/2016)  . Atrial fibrillation ([place])   . ESRD (end stage renal disease) on dialysis [facility])    ESRD due to HTN started HD 2003, had renal Tx 2009 for 18 mos. Was on NX stage hemodialysis as of 2018 but in late 2019 is back at [facility] incenter HD.   Marland Kitchen Great toe amputation status    status post left hallux amputation 08/01/14  . History of blood transfusion 2009   "S/P biopsy for prostate cancer check"  . Hypothyroidism (acquired)   . Mitral stenosis    moderate mitral stenosis  . Nonhealing surgical wound    left thigh  . Nonischemic cardiomyopathy ([place])   . Pneumonia 2014; 08/05/2016  . Pulmonary HTN ([place])   . Renal insufficiency   . Thyroid cancer ([place]) 2011    Past Surgical History:  Procedure Laterality Date  . A/V SHUNTOGRAM N/A 02/25/2018   Procedure: A/V SHUNTOGRAM;  Surgeon: Marty Heck,  MD;  Location: [place] [facility];  Service: Cardiovascular;  Laterality: N/A;  lt arm fistula  . ARTERIAL LINE INSERTION Right 08/24/2017   Procedure: ARTERIAL LINE INSERTION INTO RIGHT FEMORAL ARTERY;  Surgeon: Conrad Ceres, MD;  Location: [place];  Service: Vascular;  Laterality: Right;  . BASCILIC VEIN TRANSPOSITION Right 10/13/2017   Procedure: FIRST STAGE BRACHIAL VEIN TRANSPOSITION RIGHT ARM;  Surgeon: Conrad Pollock, MD;  Location: [place];  Service: Vascular;  Laterality: Right;  . BIOPSY  09/25/2017   Procedure: BIOPSY;  Surgeon: Ronnette Juniper, MD;  Location: [facility] ENDOSCOPY;  Service: Gastroenterology;;  . CARDIAC CATHETERIZATION Right 2017  . CARDIAC DEFIBRILLATOR PLACEMENT  2009  . DIALYSIS FISTULA CREATION  Left 2003   "upper arm"  . DIALYSIS/PERMA CATHETER REMOVAL Left 09/01/2017   Procedure: DIALYSIS/PERMA CATHETER REMOVAL;  Surgeon: Conrad Cleone, MD;  Location: [place];  Service: Vascular;  Laterality: Left;  . ESOPHAGOGASTRODUODENOSCOPY (EGD) WITH PROPOFOL N/A 09/25/2017   Procedure: ESOPHAGOGASTRODUODENOSCOPY (EGD) WITH PROPOFOL;  Surgeon: Ronnette Juniper, MD;  Location: [place];  Service: Gastroenterology;  Laterality: N/A;  . HERNIA REPAIR    . I&D EXTREMITY Right 11/06/2017   Procedure: IRRIGATION AND DEBRIDEMENT KNEE;  Surgeon: Rod Can, MD;  Location: [place];  Service: Orthopedics;  Laterality: Right;  . I&D EXTREMITY Left 01/03/2018   Procedure: IRRIGATION AND DEBRIDEMENT OF THIGH, wound vac application  wound size 10cm x 2.5cm x 2.5cm;  Surgeon: Waynetta Sandy, MD;  Location: [place];  Service: Vascular;  Laterality: Left;  . ICD LEAD REMOVAL Right 08/20/2017   Procedure: ICD EXTRACTION WITH C-ARM;  Surgeon: Evans Lance, MD;  Location: [facility] OR;  Service: Cardiovascular;  Laterality: Right;  DR VAN TRIGT TO BACK-UP  . icd removed    . INSERTION OF DIALYSIS CATHETER N/A 11/11/2016   Procedure: INSERTION OF TUNNELED DIALYSIS CATHETER;  Surgeon: Angelia Mould, MD;  Location: [place];  Service: Vascular;  Laterality: N/A;  . INSERTION OF DIALYSIS CATHETER Left 08/24/2017   Procedure: INSERTION OF DIALYSIS CATHETER;  Surgeon: Conrad Vander, MD;  Location: [place];  Service: Vascular;  Laterality: Left;  . INSERTION OF DIALYSIS CATHETER Left 09/01/2017   Procedure: INSERTION OF DIALYSIS CATHETER;  Surgeon: Conrad Barranquitas, MD;  Location: [place];  Service: Vascular;  Laterality: Left;  . KIDNEY TRANSPLANT  2009  . KNEE SURGERY Bilateral 2000s   "drained fluid"  . LAPAROSCOPIC GASTRIC BANDING  2006  . LIGATION OF ARTERIOVENOUS  FISTULA Right 11/10/2017   Procedure: LIGATION OF BRACHIAL VEIN TRANSPOSITION RIGHT ARM;  Surgeon: Angelia Mould, MD;  Location: [place];  Service:  Vascular;  Laterality: Right;  . MASS EXCISION N/A 01/12/2018   Procedure: EXCISION OF MULTIPLE SUBCUTANEOUS MASSES;  Surgeon: Ralene Ok, MD;  Location: [place];  Service: General;  Laterality: N/A;  . PROSTATE BIOPSY  2009  . REVISON OF ARTERIOVENOUS FISTULA Left 08/18/8117   Procedure: PLICATION OF ARTERIOVENOUS FISTULA ANEURYSM;  Surgeon: Elam Dutch, MD;  Location: [place];  Service: Vascular;  Laterality: Left;  . REVISON OF ARTERIOVENOUS FISTULA Left 11/10/2016   Procedure: REVISON OF ARTERIOVENOUS FISTULA - LEFT UPPER ARM;  Surgeon: Elam Dutch, MD;  Location: [place];  Service: Vascular;  Laterality: Left;  . REVISON OF ARTERIOVENOUS FISTULA Left 08/24/2017   Procedure: REVISON OF ARTERIOVENOUS FISTULA LEFT;  Surgeon: Conrad Garza-Salinas II, MD;  Location: [place];  Service: Vascular;  Laterality: Left;  . TEE WITHOUT CARDIOVERSION N/A 08/20/2017  Procedure: TRANSESOPHAGEAL ECHOCARDIOGRAM (TEE);  Surgeon: Evans Lance, MD;  Location: [place];  Service: Cardiovascular;  Laterality: N/A;  . THROMBECTOMY W/ EMBOLECTOMY Left 12/25/2014   Procedure: THROMBECTOMY ARTERIOVENOUS FISTULA;  Surgeon: Elam Dutch, MD;  Location: [place];  Service: Vascular;  Laterality: Left;  . THYROIDECTOMY  2011  . TOE AMPUTATION Bilateral     right 1st and  2nd digits; left 1st and 3rd digits"  . UMBILICAL HERNIA REPAIR  X 2  . UPPER EXTREMITY VENOGRAPHY Right 11/05/2017   Procedure: UPPER EXTREMITY VENOGRAPHY;  Surgeon: Conrad Quitman, MD;  Location: [place] [facility];  Service: Cardiovascular;  Laterality: Right;  Marland Kitchen VASCULAR ACCESS DEVICE INSERTION Left 11/10/2017   Procedure: INSERTION OF SUPER HERO VASCULAR ACCESS DEVICE LEFT ARM;  Surgeon: Angelia Mould, MD;  Location: [place];  Service: Vascular;  Laterality: Left;  Marland Kitchen VEIN HARVEST Left 08/24/2017   Procedure: VEIN HARVEST;  Surgeon: Conrad Union, MD;  Location: [place];  Service: Vascular;  Laterality: Left;  . WOUND DEBRIDEMENT Left 12/22/2017    Procedure: EXCISION OF LEFT THIGH WOUND;  Surgeon: Elam Dutch, MD;  Location: [place];  Service: Vascular;  Laterality: Left;  . WOUND DEBRIDEMENT Left 01/28/2018   Procedure: DEBRIDEMENT WOUND LEFT THIGH;  Surgeon: Marty Heck, MD;  Location: [place];  Service: Vascular;  Laterality: Left;  . WOUND DEBRIDEMENT Right 02/23/2018   Procedure: DEBRIDEMENT AND IRRIGATION OF RIGHT FLANK WOUND;  Surgeon: Ralene Ok, MD;  Location: [place];  Service: General;  Laterality: Right;    Current Outpatient Medications  Medication Sig Dispense Refill  . albuterol (PROVENTIL) (2.5 MG/3ML) 0.083% nebulizer solution Take 3 mLs (2.5 mg total) by nebulization every 6 (six) hours as needed for wheezing or shortness of breath. 360 mL 5  . ambrisentan (LETAIRIS) 10 MG tablet Take 1 tablet (10 mg total) by mouth daily. 30 tablet 11  . amiodarone (PACERONE) 200 MG tablet Take 200 mg by mouth daily.     Marland Kitchen apixaban (ELIQUIS) 2.5 MG TABS tablet Take 1 tablet (2.5 mg total) by mouth 2 (two) times daily. 60 tablet 0  . atorvastatin (LIPITOR) 40 MG tablet Take 1 tablet (40 mg total) by mouth every evening. 30 tablet 2  . cinacalcet (SENSIPAR) 90 MG tablet Take 90 mg by mouth every evening.    Marland Kitchen levothyroxine (SYNTHROID, LEVOTHROID) 300 MCG tablet Take 1 tablet (300 mcg total) by mouth daily before breakfast. 90 tablet 3  . midodrine (PROAMATINE) 10 MG tablet TAKE 1 TABLET (10 MG TOTAL) BY MOUTH 3 (THREE) TIMES DAILY. 90 tablet 5  . multivitamin (RENA-VIT) TABS tablet TAKE 1 TABLET BY MOUTH EVERYDAY AT BEDTIME (Patient taking differently: Take 1 tablet by mouth at bedtime. ) 30 tablet 0  . nystatin (MYCOSTATIN/NYSTOP) powder Apply topically 4 (four) times daily. (Patient taking differently: Apply 1 g topically daily. ) 60 g 0  . pantoprazole (PROTONIX) 40 MG tablet Take 1 tablet (40 mg total) by mouth 2 (two) times daily. 30 tablet 1  . tadalafil (ADCIRCA/CIALIS) 20 MG tablet Take 20 mg by mouth daily.     .  traMADol (ULTRAM) 50 MG tablet Take 1 tablet (50 mg total) by mouth every 6 (six) hours as needed. (Patient taking differently: Take 50 mg by mouth every 6 (six) hours as needed for moderate pain. ) 20 tablet 0   No current facility-administered medications for this visit.     Allergies:   Enalapril and Iodinated diagnostic  agents    Social History:  The patient  reports that he has never smoked. He has never used smokeless tobacco. He reports that he drinks alcohol. He reports that he does not use drugs.   Family History:  The patient's family history includes CAD (age of onset: 41) in his mother; Emphysema in his mother; Heart failure in his mother; Hypertension in his father and mother; Kidney failure in his father.  He indicated that his mother is deceased. He indicated that his father is deceased. He indicated that his maternal grandmother is deceased. He indicated that his maternal grandfather is deceased. He indicated that his paternal grandmother is deceased. He indicated that his paternal grandfather is deceased.   ROS:  Please see the history of present illness. All other systems are reviewed and negative.    PHYSICAL EXAM: VS:  BP 98/60   Pulse (!) 102   Ht 6\' 1"  (1.854 m)   Wt 266 lb (120.7 kg)   SpO2 95%   BMI 35.09 kg/m  , BMI Body mass index is 35.09 kg/m. GEN: Well nourished, well developed, male in no acute distress HEENT: normal for age  Neck: no JVD, no carotid bruit, no masses Cardiac: RRR; 2/6 aortic stenosis murmur, no rubs, or gallops Respiratory: Few rales bases bilaterally, normal work of breathing GI: soft, nontender, nondistended, + BS MS: no deformity or atrophy; trace lower extremity edema right, slightly more on the left; distal pulses are 2+ in lower extremities, pulses are present in both upper extremities in the antecubital fossa, capillary refill is within normal limits Skin: warm and dry, no rash Neuro:  Strength and sensation are intact Psych:  euthymic mood, full affect   EKG:  EKG is not ordered today.   ECHO: 02/19/2018 - Left ventricle: The cavity size was normal. There was moderate   concentric hypertrophy. Systolic function was moderately to   severely reduced. The estimated ejection fraction was in the   range of 30% to 35%. Diffuse hypokinesis. Doppler parameters are   consistent with abnormal left ventricular relaxation (grade 1   diastolic dysfunction). The Ee&' ratio is >15, suggesting elevated   LV filling pressure. - Ventricular septum: Septal motion showed abnormal function and   dyssynergy. The contour showed diastolic flattening and systolic   flattening. - Aortic valve: Calcified with restricted leaflet motion. There is   moderate stenosis. Mean gradient (S): 26 mm Hg. Peak gradient   (S): 41 mm Hg. Valve area (VTI): 1.16 cm^2. Valve area (Vmax):   1.2 cm^2. Valve area (Vmean): 1.05 cm^2. - Mitral valve: Valve area by continuity equation (using LVOT   flow): 1.83 cm^2. - Left atrium: Moderately dilated. - Right ventricle: The cavity size was severely dilated. AICD noted   in right ventricle. - Right atrium: Severely dilated. AICD noted in right atrium. - Tricuspid valve: There was severe regurgitation. - Pulmonary arteries: PA peak pressure: 61 mm Hg (S). - Inferior vena cava: The vessel was dilated. The respirophasic   diameter changes were blunted (< 50%), consistent with elevated   central venous pressure.  Impressions:  - Compared to a prior study in 08/2017, the LVEF is lower at 30-35%   with severe global hypokinesis, grade 1 DD and elevated LV   filling pressure. There is severe RV enlargement with moderate RV   Systolic dysfunction, severe TR and severe RAE, RVSP is 61 mmHg,   the IVC is dilated. The aortic valve is calcified with moderate   stenosis -  mean gradient of 26 mmHg and AVA around 1.2 cm2.  CATH: None   Recent Labs: 08/26/2017: Magnesium 1.8 02/18/2018: ALT 12 02/19/2018:  TSH 3.781 02/26/2018: BUN 34; Creatinine, Ser 5.28; Hemoglobin 8.4; Platelets 143; Potassium 4.7; Sodium 134  CBC    Component Value Date/Time   WBC 10.0 02/26/2018 0313   RBC 3.05 (L) 02/26/2018 0313   HGB 8.4 (L) 02/26/2018 0313   HGB 8.8 (L) 02/10/2018 1556   HCT 28.5 (L) 02/26/2018 0313   HCT 28.2 (L) 02/10/2018 1556   PLT 143 (L) 02/26/2018 0313   PLT 122 (L) 02/10/2018 1556   MCV 93.4 02/26/2018 0313   MCV 87 02/10/2018 1556   MCH 27.5 02/26/2018 0313   MCHC 29.5 (L) 02/26/2018 0313   RDW 23.2 (H) 02/26/2018 0313   RDW 17.7 (H) 02/10/2018 1556   LYMPHSABS 0.6 (L) 02/26/2018 0313   LYMPHSABS 0.5 (L) 02/10/2018 1556   MONOABS 0.5 02/26/2018 0313   EOSABS 0.0 02/26/2018 0313   EOSABS 0.1 02/10/2018 1556   BASOSABS 0.0 02/26/2018 0313   BASOSABS 0.0 02/10/2018 1556   CMP Latest Ref Rng & Units 02/26/2018 02/24/2018 02/23/2018  Glucose 70 - 99 mg/dL 99 99 74  BUN 6 - 20 mg/dL 34(H) 29(H) 20  Creatinine 0.61 - 1.24 mg/dL 5.28(H) 4.82(H) 3.82(H)  Sodium 135 - 145 mmol/L 134(L) 134(L) 137  Potassium 3.5 - 5.1 mmol/L 4.7 5.1 4.2  Chloride 98 - 111 mmol/L 97(L) 98 100  CO2 22 - 32 mmol/L 25 24 26   Calcium 8.9 - 10.3 mg/dL 8.7(L) 8.5(L) 8.3(L)  Total Protein 6.5 - 8.1 g/dL - - -  Total Bilirubin 0.3 - 1.2 mg/dL - - -  Alkaline Phos 38 - 126 U/L - - -  AST 15 - 41 U/L - - -  ALT 0 - 44 U/L - - -     Lipid Panel    Component Value Date/Time   CHOL 149 03/24/2016 1616   TRIG 142 03/24/2016 1616   HDL 40 (L) 03/24/2016 1616   CHOLHDL 3.7 03/24/2016 1616   VLDL 28 03/24/2016 1616   LDLCALC 81 03/24/2016 1616     Wt Readings from Last 3 Encounters:  03/08/18 266 lb (120.7 kg)  03/03/18 277 lb 6.4 oz (125.8 kg)  02/24/18 266 lb 15.6 oz (121.1 kg)     Other studies Reviewed: Additional studies/ records that were reviewed today include: Office notes, hospital records and testing.  ASSESSMENT AND PLAN:  1.  Dyspnea on exertion, moderate AS: He is compliant with  dialysis.  Volume is managed there.  He does not have significant volume overload by exam. - His EF is now decreased - He has moderate aortic stenosis by echo - I am concerned that he has low EF, low output, aortic stenosis, underestimating his gradient - Discuss with Dr. Sallyanne Kuster if he is possibly a candidate for TAVR, if so, may need to get a dobutamine echo  2.  Cardiomyopathy: -His EF was previously decreased and he was felt to have a nonischemic cardiomyopathy. - His EF had improved, but is now decreased again.  However, there are no wall motion abnormalities on the echo. - Discuss with Dr. Sallyanne Kuster if an ischemic evaluation is needed, and its timing -I am unable to start him on ACE, beta-blocker, etc. because his blood pressure is too low to add anything.  3.  PAH: He follows with the pulmonary team for this.  He is compliant with the tadalafil and ambrisentan. -His  PAS was 61 on recent echo  4.  Chronic hypotension: Continue Midodrine.   Current medicines are reviewed at length with the patient today.  The patient does not have concerns regarding medicines.  The following changes have been made:  no change  Labs/ tests ordered today include:  No orders of the defined types were placed in this encounter.    Disposition:   FU with Sanda Klein, MD  Signed, Rosaria Ferries, PA-C  03/08/2018 4:09 PM    [place] Phone: [phone]; Fax: [phone]  This note was written with the assistance of speech recognition software.  Please excuse any transcriptional errors.

## 2018-03-08 NOTE — Patient Instructions (Signed)
Medication Instructions:  No Changes If you need a refill on your cardiac medications before your next appointment, please call your pharmacy.   Lab work: None Ordered. If you have labs (blood work) drawn today and your tests are completely normal, you will receive your results only by: Marland Kitchen MyChart Message (if you have MyChart) OR . A paper copy in the mail If you have any lab test that is abnormal or we need to change your treatment, we will call you to review the results.  Testing/Procedures: None Ordered.  Follow-Up: . At [facility], you and your health needs are our priority.  As part of our continuing mission to provide you with exceptional heart care, we have created designated Provider Care Teams.  These Care Teams include your primary Cardiologist (physician) and Advanced Practice Providers (APPs -  Physician Assistants and Nurse Practitioners) who all work together to provide you with the care you need, when you need it. . Follow up with Dr.Croitoru at first available.   Any Other Special Instructions Will Be Listed Below (If Applicable). None

## 2018-03-08 NOTE — Telephone Encounter (Signed)
Patient with diagnosis of Afib on Eliquis for anticoagulation.    Procedure: colonoscopy Date of procedure: 04/20/18  CHADS2-VASc score of  2 (CHF, HTN, AGE, DM2, stroke/tia x 2, PAD, AGE, male)  Pt with ESRD will route to Dr. Stanford Breed for recs on length of hold. Previously approved to hold for 5 days for different procedure.

## 2018-03-08 NOTE — Telephone Encounter (Signed)
   [facility] Pre-operative Risk Assessment    Request for surgical clearance:  1. What type of surgery is being performed? Colonoscopy   2. When is this surgery scheduled? 04/20/18  3. What type of clearance is required (medical clearance vs. Pharmacy clearance to hold med vs. Both)? Pharmacy  4. Are there any medications that need to be held prior to surgery and how long? Eliquis  5. Practice name and name of physician performing surgery? [facility]  Dr.Karki  6. What is your office phone number [phone]   7.   What is your office fax number [phone]  8.   Anesthesia type (None, local, MAC, general) ? Not listed   Kathyrn Lass 03/08/2018, 8:44 AM  _________________________________________________________________   (provider comments below)

## 2018-03-09 DIAGNOSIS — T8189XD Other complications of procedures, not elsewhere classified, subsequent encounter: Secondary | ICD-10-CM | POA: Diagnosis not present

## 2018-03-09 DIAGNOSIS — I120 Hypertensive chronic kidney disease with stage 5 chronic kidney disease or end stage renal disease: Secondary | ICD-10-CM | POA: Diagnosis not present

## 2018-03-09 DIAGNOSIS — I080 Rheumatic disorders of both mitral and aortic valves: Secondary | ICD-10-CM | POA: Diagnosis not present

## 2018-03-09 DIAGNOSIS — I700 Atherosclerosis of aorta: Secondary | ICD-10-CM | POA: Diagnosis not present

## 2018-03-09 DIAGNOSIS — I480 Paroxysmal atrial fibrillation: Secondary | ICD-10-CM | POA: Diagnosis not present

## 2018-03-09 DIAGNOSIS — N186 End stage renal disease: Secondary | ICD-10-CM | POA: Diagnosis not present

## 2018-03-09 DIAGNOSIS — D631 Anemia in chronic kidney disease: Secondary | ICD-10-CM | POA: Diagnosis not present

## 2018-03-09 DIAGNOSIS — I9589 Other hypotension: Secondary | ICD-10-CM | POA: Diagnosis not present

## 2018-03-09 DIAGNOSIS — I255 Ischemic cardiomyopathy: Secondary | ICD-10-CM | POA: Diagnosis not present

## 2018-03-09 NOTE — Progress Notes (Signed)
I'll take a look at his echo tomorrow Mcr

## 2018-03-09 NOTE — Telephone Encounter (Signed)
Hold eliquis for 72 h before colonoscopy

## 2018-03-09 NOTE — Telephone Encounter (Signed)
Appears to be Dr Croitoru's pt Kirk Ruths

## 2018-03-10 ENCOUNTER — Ambulatory Visit: Payer: Medicare HMO | Admitting: Pulmonary Disease

## 2018-03-10 DIAGNOSIS — D631 Anemia in chronic kidney disease: Secondary | ICD-10-CM | POA: Diagnosis not present

## 2018-03-10 DIAGNOSIS — N186 End stage renal disease: Secondary | ICD-10-CM | POA: Diagnosis not present

## 2018-03-10 DIAGNOSIS — E8770 Fluid overload, unspecified: Secondary | ICD-10-CM | POA: Diagnosis not present

## 2018-03-10 DIAGNOSIS — N2581 Secondary hyperparathyroidism of renal origin: Secondary | ICD-10-CM | POA: Diagnosis not present

## 2018-03-10 DIAGNOSIS — D509 Iron deficiency anemia, unspecified: Secondary | ICD-10-CM | POA: Diagnosis not present

## 2018-03-10 NOTE — Telephone Encounter (Signed)
This has been routed to Dr. Sallyanne Kuster for appt on 04/05/18. I will remove from the Pre Op Call back pool.

## 2018-03-10 NOTE — Telephone Encounter (Signed)
   Primary Cardiologist:Mihai Croitoru, MD  Chart reviewed as part of pre-operative protocol coverage. Because of Adam Becvar Jr.'s past medical history and time since last visit, he will require a follow-up visit in order to better assess preoperative cardiovascular risk.  He has a hx of non ischemic cardiomyopathy, pulmonary hypertension, atrial fibrillation, ESRD on dialysis.  He did have recovery of LVF and his EF was normal on Echo in 4/19.  He was seen 03/08/18 for evaluation by Rosaria Ferries, PA-C.  A recent echo on 02/19/18 demonstrated worsening LVF with an EF 30-35%.  He also has mod AS with a mean AV gradient of 26 mmHg.  The patient complained of significant shortness of breath and fatigue at his visit.  Further evaluation is pending.  He has an appointment with Dr. Dani Gobble Croitoru 11.25.19 (GI procedure planned 12.10.19).  Anticoagulation:  Dr. Victorino December recommendation is to hold Apixaban for 72 hours prior to his procedure.   Clearance:  Final recommendations will be made during office visit with Dr. Sallyanne Kuster on 11.25.19.   I will route this note to Dr. Sallyanne Kuster.   Call back staff: 1. A letter outlining the above has been faxed to the surgeon.  Please make sure it was received.   This note will be removed from the preop pool.  Richardson Dopp, PA-C  03/10/2018, 12:53 PM

## 2018-03-11 DIAGNOSIS — Z992 Dependence on renal dialysis: Secondary | ICD-10-CM | POA: Diagnosis not present

## 2018-03-11 DIAGNOSIS — N186 End stage renal disease: Secondary | ICD-10-CM | POA: Diagnosis not present

## 2018-03-11 DIAGNOSIS — E1129 Type 2 diabetes mellitus with other diabetic kidney complication: Secondary | ICD-10-CM | POA: Diagnosis not present

## 2018-03-11 NOTE — Progress Notes (Signed)
I reviewed the echo images. A few disagreements with the report, primarily the fact that LVEF is better in my opinion(around 40-45%), but appears lower than that due to very severe abnormal septal motion due to RV volume and pressure overload. The aortic stenosis is squarely in the moderate range by 2D and Doppler, even making allowance for the weakened LV. I do not think that the AS is the cause of the low LVEF.  Biggest problem is severe RV dysfunction and elevated right heart filling pressures, despite the pulmonary vasodilators. He has elevated left atrial pressure as well, probably due to inadequate HD , beacause of low systemic BP.  MCr

## 2018-03-12 DIAGNOSIS — N2581 Secondary hyperparathyroidism of renal origin: Secondary | ICD-10-CM | POA: Diagnosis not present

## 2018-03-12 DIAGNOSIS — N186 End stage renal disease: Secondary | ICD-10-CM | POA: Diagnosis not present

## 2018-03-12 NOTE — Progress Notes (Signed)
Thanks, Ruby Cola!

## 2018-03-12 NOTE — Progress Notes (Signed)
Sure that works

## 2018-03-13 DIAGNOSIS — I9589 Other hypotension: Secondary | ICD-10-CM | POA: Diagnosis not present

## 2018-03-13 DIAGNOSIS — I255 Ischemic cardiomyopathy: Secondary | ICD-10-CM | POA: Diagnosis not present

## 2018-03-13 DIAGNOSIS — I700 Atherosclerosis of aorta: Secondary | ICD-10-CM | POA: Diagnosis not present

## 2018-03-13 DIAGNOSIS — I480 Paroxysmal atrial fibrillation: Secondary | ICD-10-CM | POA: Diagnosis not present

## 2018-03-13 DIAGNOSIS — I120 Hypertensive chronic kidney disease with stage 5 chronic kidney disease or end stage renal disease: Secondary | ICD-10-CM | POA: Diagnosis not present

## 2018-03-13 DIAGNOSIS — I080 Rheumatic disorders of both mitral and aortic valves: Secondary | ICD-10-CM | POA: Diagnosis not present

## 2018-03-13 DIAGNOSIS — D631 Anemia in chronic kidney disease: Secondary | ICD-10-CM | POA: Diagnosis not present

## 2018-03-13 DIAGNOSIS — T8189XD Other complications of procedures, not elsewhere classified, subsequent encounter: Secondary | ICD-10-CM | POA: Diagnosis not present

## 2018-03-13 DIAGNOSIS — N186 End stage renal disease: Secondary | ICD-10-CM | POA: Diagnosis not present

## 2018-03-15 ENCOUNTER — Ambulatory Visit: Payer: Medicare HMO | Admitting: Pulmonary Disease

## 2018-03-15 DIAGNOSIS — E7849 Other hyperlipidemia: Secondary | ICD-10-CM | POA: Diagnosis not present

## 2018-03-15 DIAGNOSIS — N2589 Other disorders resulting from impaired renal tubular function: Secondary | ICD-10-CM | POA: Diagnosis not present

## 2018-03-15 DIAGNOSIS — D511 Vitamin B12 deficiency anemia due to selective vitamin B12 malabsorption with proteinuria: Secondary | ICD-10-CM | POA: Diagnosis not present

## 2018-03-15 DIAGNOSIS — K7689 Other specified diseases of liver: Secondary | ICD-10-CM | POA: Diagnosis not present

## 2018-03-15 DIAGNOSIS — D631 Anemia in chronic kidney disease: Secondary | ICD-10-CM | POA: Diagnosis not present

## 2018-03-15 DIAGNOSIS — D509 Iron deficiency anemia, unspecified: Secondary | ICD-10-CM | POA: Diagnosis not present

## 2018-03-15 DIAGNOSIS — N2581 Secondary hyperparathyroidism of renal origin: Secondary | ICD-10-CM | POA: Diagnosis not present

## 2018-03-15 DIAGNOSIS — N186 End stage renal disease: Secondary | ICD-10-CM | POA: Diagnosis not present

## 2018-03-15 DIAGNOSIS — E785 Hyperlipidemia, unspecified: Secondary | ICD-10-CM | POA: Diagnosis not present

## 2018-03-15 NOTE — Progress Notes (Deleted)
Subjective:    Patient ID: Adam Alm., male    DOB: 02/12/1961, 57 y.o.   MRN: [id number]  Synopsis: Diagnosed with pulmonary hypertension in 2012. 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 Unfortunately his kidney transplant failed for uncertain reasons by 4270. Was diagnosed with pulmonary hypertension by 2012 in [place]. Was started on [place] in [place] and moved to [place] not long afterwards. Developed atrial fibrillation around 2009, had a defibrillator placed.  08/2015 [facility]: Cardiac Index (l/min/m2) 3.7 L/min/m2  Right Atrium Mean Pressure (mmHg) 5 mmHg   Right Ventricle Systolic Pressure (mmHg) 46 mmHg   Pulmonary Artery Mean Pressure (mmHg) 31 mmHg   Pulmonary Wedge Pressure (mmHg) 11 mmHg   Pulmonary Vascular Resistance (Wood units) 2.2 Wood units   He had a second evaluation for renal transplant at [facility] in 2017 but was denied because of his hypertension and atrial fibrillation.  His dry weight is 122kg, his BP is best there.   He was hospitalized in early 2018 for healthcare associated pneumonia  In 2019 he was hospitalized for a staph infection involving a septic knee joint, he had to have his vascular dialysis graft removed and his pacemaker changed.  HPI No chief complaint on file.  ***  Past Medical History:  Diagnosis Date  . Abscess 02/2018   LEFT THIGH   . AICD (automatic cardioverter/defibrillator) present    [employer] - s/p extraction 08/21/17 due to staph infection  . Anemia   . Aortic stenosis    moderate AS by 08/2016 echo  . Arthritis    "knees" (08/05/2016)  . Atrial fibrillation ([place])   . ESRD (end stage renal disease) on dialysis [facility] [place])    ESRD due to HTN started HD 2003, had renal Tx 2009 for 18 mos. Was on NX stage hemodialysis as of 2018 but in late 2019 is back at [facility] incenter HD.   Marland Kitchen Great toe amputation status    status post left hallux amputation 08/01/14  .  History of blood transfusion 2009   "S/P biopsy for prostate cancer check"  . Hypothyroidism (acquired)   . Mitral stenosis    moderate mitral stenosis  . Nonhealing surgical wound    left thigh  . Nonischemic cardiomyopathy ([place])   . Pneumonia 2014; 08/05/2016  . Pulmonary HTN ([place])   . Renal insufficiency   . Thyroid cancer ([place]) 2011      Review of Systems  Constitutional: Positive for fatigue. Negative for chills and fever.  HENT: Negative for postnasal drip, rhinorrhea and sinus pressure.   Respiratory: Positive for shortness of breath. Negative for cough and wheezing.   Cardiovascular: Positive for leg swelling. Negative for chest pain and palpitations.       Objective:   Physical Exam There were no vitals filed for this visit. RA  ***t    CBC    Component Value Date/Time   WBC 10.0 02/26/2018 0313   RBC 3.05 (L) 02/26/2018 0313   HGB 8.4 (L) 02/26/2018 0313   HGB 8.8 (L) 02/10/2018 1556   HCT 28.5 (L) 02/26/2018 0313   HCT 28.2 (L) 02/10/2018 1556   PLT 143 (L) 02/26/2018 0313   PLT 122 (L) 02/10/2018 1556   MCV 93.4 02/26/2018 0313   MCV 87 02/10/2018 1556   MCH 27.5 02/26/2018 0313   MCHC 29.5 (L) 02/26/2018 0313   RDW 23.2 (H) 02/26/2018 0313   RDW 17.7 (H) 02/10/2018  1556   LYMPHSABS 0.6 (L) 02/26/2018 0313   LYMPHSABS 0.5 (L) 02/10/2018 1556   MONOABS 0.5 02/26/2018 0313   EOSABS 0.0 02/26/2018 0313   EOSABS 0.1 02/10/2018 1556   BASOSABS 0.0 02/26/2018 0313   BASOSABS 0.0 02/10/2018 1556     6MW March 2017 6 minute walk test at [facility] 385 m, O2 saturation 100% on room air  Echo: March 2017 echocardiogram [facility] RVSP 40 mmHg, LVEF 55% March 2018 echo [facility]: RVSP 21mm Hg 08/2017 Echo RVSP 41mmHg  RHC April 2017 right heart catheterization [facility] cardiac index 3.7, right atrial pressure 5, RVSP 46, mean PA pressure 31, pulmonary capillary wedge pressure 11, pulmonary vascular resistance 2.2  Records from his  physician's office from yesterday (primary care) where he was seen for cellulitis, and a complete blood count was checked and 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     Assessment & Plan:   No diagnosis found.  Discussion: ***   Current Outpatient Medications:  .  albuterol (PROVENTIL) (2.5 MG/3ML) 0.083% nebulizer solution, Take 3 mLs (2.5 mg total) by nebulization every 6 (six) hours as needed for wheezing or shortness of breath., Disp: 360 mL, Rfl: 5 .  ambrisentan (LETAIRIS) 10 MG tablet, Take 1 tablet (10 mg total) by mouth daily., Disp: 30 tablet, Rfl: 11 .  amiodarone (PACERONE) 200 MG tablet, Take 200 mg by mouth daily. , Disp: , Rfl:  .  apixaban (ELIQUIS) 2.5 MG TABS tablet, Take 1 tablet (2.5 mg total) by mouth 2 (two) times daily., Disp: 60 tablet, Rfl: 0 .  atorvastatin (LIPITOR) 40 MG tablet, Take 1 tablet (40 mg total) by mouth every evening., Disp: 30 tablet, Rfl: 2 .  cinacalcet (SENSIPAR) 90 MG tablet, Take 90 mg by mouth every evening., Disp: , Rfl:  .  levothyroxine (SYNTHROID, LEVOTHROID) 300 MCG tablet, Take 1 tablet (300 mcg total) by mouth daily before breakfast., Disp: 90 tablet, Rfl: 3 .  midodrine (PROAMATINE) 10 MG tablet, TAKE 1 TABLET (10 MG TOTAL) BY MOUTH 3 (THREE) TIMES DAILY., Disp: 90 tablet, Rfl: 5 .  multivitamin (RENA-VIT) TABS tablet, TAKE 1 TABLET BY MOUTH EVERYDAY AT BEDTIME (Patient taking differently: Take 1 tablet by mouth at bedtime. ), Disp: 30 tablet, Rfl: 0 .  nystatin (MYCOSTATIN/NYSTOP) powder, Apply topically 4 (four) times daily. (Patient taking differently: Apply 1 g topically daily. ), Disp: 60 g, Rfl: 0 .  pantoprazole (PROTONIX) 40 MG tablet, Take 1 tablet (40 mg total) by mouth 2 (two) times daily., Disp: 30 tablet, Rfl: 1 .  tadalafil (ADCIRCA/CIALIS) 20 MG tablet, Take 20 mg by mouth daily. , Disp: , Rfl:  .  traMADol (ULTRAM) 50 MG tablet, Take 1 tablet (50 mg total) by mouth every 6 (six) hours as needed. (Patient  taking differently: Take 50 mg by mouth every 6 (six) hours as needed for moderate pain. ), Disp: 20 tablet, Rfl: 0

## 2018-03-16 ENCOUNTER — Ambulatory Visit: Payer: Medicare HMO | Admitting: Podiatry

## 2018-03-16 DIAGNOSIS — F40298 Other specified phobia: Secondary | ICD-10-CM

## 2018-03-16 DIAGNOSIS — Z95810 Presence of automatic (implantable) cardiac defibrillator: Secondary | ICD-10-CM | POA: Diagnosis not present

## 2018-03-16 DIAGNOSIS — R7881 Bacteremia: Secondary | ICD-10-CM | POA: Diagnosis not present

## 2018-03-16 DIAGNOSIS — M79675 Pain in left toe(s): Secondary | ICD-10-CM | POA: Diagnosis not present

## 2018-03-16 DIAGNOSIS — B351 Tinea unguium: Secondary | ICD-10-CM

## 2018-03-16 DIAGNOSIS — L84 Corns and callosities: Secondary | ICD-10-CM

## 2018-03-16 DIAGNOSIS — T8189XA Other complications of procedures, not elsewhere classified, initial encounter: Secondary | ICD-10-CM | POA: Diagnosis not present

## 2018-03-16 DIAGNOSIS — M79674 Pain in right toe(s): Secondary | ICD-10-CM

## 2018-03-16 DIAGNOSIS — M1712 Unilateral primary osteoarthritis, left knee: Secondary | ICD-10-CM | POA: Diagnosis not present

## 2018-03-16 DIAGNOSIS — N186 End stage renal disease: Secondary | ICD-10-CM | POA: Diagnosis not present

## 2018-03-16 DIAGNOSIS — E669 Obesity, unspecified: Secondary | ICD-10-CM | POA: Diagnosis not present

## 2018-03-16 DIAGNOSIS — T8130XA Disruption of wound, unspecified, initial encounter: Secondary | ICD-10-CM | POA: Diagnosis not present

## 2018-03-16 DIAGNOSIS — E1149 Type 2 diabetes mellitus with other diabetic neurological complication: Secondary | ICD-10-CM

## 2018-03-16 DIAGNOSIS — I6529 Occlusion and stenosis of unspecified carotid artery: Secondary | ICD-10-CM | POA: Diagnosis not present

## 2018-03-16 DIAGNOSIS — T829XXA Unspecified complication of cardiac and vascular prosthetic device, implant and graft, initial encounter: Secondary | ICD-10-CM | POA: Diagnosis not present

## 2018-03-16 DIAGNOSIS — A419 Sepsis, unspecified organism: Secondary | ICD-10-CM | POA: Diagnosis not present

## 2018-03-16 DIAGNOSIS — J9601 Acute respiratory failure with hypoxia: Secondary | ICD-10-CM | POA: Diagnosis not present

## 2018-03-16 DIAGNOSIS — R2681 Unsteadiness on feet: Secondary | ICD-10-CM

## 2018-03-16 NOTE — Progress Notes (Addendum)
Subjective: Mr. Youngblood presents the office today for concerns of thick, painful, elongated toenails that he cannot trim himself as well as for painful corns to the right fifth toe and to the outside aspects of both of his feet with the right worse than the left.  He denies any recent changes to his feet otherwise.  Since I last saw him he said staph infection he has had a temporary catheter placed in his leg resulting in the wound as well as cyst removed from his back which is resulting in wound as well.  He has not had any wounds to his feet. He also states that he feels off balance and has a fear of falling. He is starting to lean forward when he walks to help avoid falls. He denies any systemic complaints such as fevers, chills, nausea, vomiting. No calf pain, chest pain, shortness of breath.  Objective: AAO x3, NAD DP/PT pulses palpable bilaterally, CRT less than 3 seconds Protective sensation decreased with Simms Weinstein monofilament Hyperkeratotic lesion present to bilateral fifth toes but there is no callus to the fifth metatarsal base today. No underlying ulceration, drainage, or signs of infection.  Bilateral fourth and fifth digit toenails are dystrophic, discolored, hypertrophic and there is irritation in shoe gear. Upon debridement there is no drainage or pus. No swelling or redness from the toenail sites. There is no overlying edema, erythema, increase in warmth bilaterally. No open lesions or pre-ulcerative lesions.  No pain with calf compression, swelling, warmth, erythema  Assessment: Symptomatic onychomycosis 4, hyperkeratotic lesions 2; gait instability/fear of falling  Plan: -All treatment options discussed with the patient including all alternatives, risks, complications.  -X-rays were obtained and reviewed. No evidence of acute fracture. Multiple digital deformities are present. Midfoot arthritic changes are present. -Nails were sharply debrided 4 without complications or  bleeding -Hyperkeratotic lesions debrided 2 without complications or bleeding. -Daily foot inspection. -Discussed [employer] Balance Bracing and he would like to do this. Information given to him. Will schedule with Betha for casting.  -Follow-up in 3 months or sooner if needed. Call any questions or concerns in the meantime.  Celesta Gentile, DPM

## 2018-03-17 ENCOUNTER — Ambulatory Visit: Payer: Medicare HMO | Admitting: Cardiology

## 2018-03-17 ENCOUNTER — Encounter: Payer: Self-pay | Admitting: Pulmonary Disease

## 2018-03-17 ENCOUNTER — Telehealth: Payer: Self-pay | Admitting: *Deleted

## 2018-03-17 ENCOUNTER — Ambulatory Visit: Payer: Medicare HMO | Admitting: Pulmonary Disease

## 2018-03-17 ENCOUNTER — Ambulatory Visit: Payer: Medicare HMO | Admitting: Orthotics

## 2018-03-17 VITALS — BP 98/56 | HR 106 | Ht 73.0 in | Wt 275.0 lb

## 2018-03-17 DIAGNOSIS — I272 Pulmonary hypertension, unspecified: Secondary | ICD-10-CM

## 2018-03-17 DIAGNOSIS — N186 End stage renal disease: Secondary | ICD-10-CM | POA: Diagnosis not present

## 2018-03-17 DIAGNOSIS — Z899 Acquired absence of limb, unspecified: Secondary | ICD-10-CM

## 2018-03-17 DIAGNOSIS — Z79899 Other long term (current) drug therapy: Secondary | ICD-10-CM | POA: Diagnosis not present

## 2018-03-17 DIAGNOSIS — R0602 Shortness of breath: Secondary | ICD-10-CM

## 2018-03-17 DIAGNOSIS — R2681 Unsteadiness on feet: Secondary | ICD-10-CM

## 2018-03-17 DIAGNOSIS — Z992 Dependence on renal dialysis: Secondary | ICD-10-CM | POA: Diagnosis not present

## 2018-03-17 DIAGNOSIS — E1149 Type 2 diabetes mellitus with other diabetic neurological complication: Secondary | ICD-10-CM

## 2018-03-17 DIAGNOSIS — L84 Corns and callosities: Secondary | ICD-10-CM

## 2018-03-17 DIAGNOSIS — F40298 Other specified phobia: Secondary | ICD-10-CM

## 2018-03-17 MED ORDER — ALBUTEROL SULFATE (2.5 MG/3ML) 0.083% IN NEBU: 2.5000 mg | INHALATION_SOLUTION | Freq: Four times a day (QID) | RESPIRATORY_TRACT | 5 refills | Status: AC | PRN

## 2018-03-17 NOTE — Assessment & Plan Note (Signed)
Low suspicion of pulmonary toxicity but will get pulmonary function test to further evaluate

## 2018-03-17 NOTE — Patient Instructions (Addendum)
Keep follow up with HD - Monday Tuesday Thursday Friday   Keep follow up with Cardiology - Nov/2019  Continue pulmonary hypertension meds as prescribed >>>continue ambrisentan  >>>continue tadalafil  Pulmonary Function Test to be placed   Follow up with [facility] 4-6 weeks      November/2019 we will be moving! We will no longer be at our [place] location.  Be on the look out for a post card/mailer to let you know we have officially moved.  Our new address and phone number will be:  [address] [place], [place] [address] Telephone number: [phone]  It is flu season:   >>>Remember to be washing your hands regularly, using hand sanitizer, be careful to use around herself with has contact with people who are sick will increase her chances of getting sick yourself. >>> Best ways to protect herself from the flu: Receive the yearly flu vaccine, practice good hand hygiene washing with soap and also using hand sanitizer when available, eat a nutritious meals, get adequate rest, hydrate appropriately   Please contact the office if your symptoms worsen or you have concerns that you are not improving.   Thank you for choosing [facility] for your healthcare, and for allowing [place] to partner with you on your healthcare journey. I am thankful to be able to provide care to you today.   Wyn Quaker FNP-C

## 2018-03-17 NOTE — Progress Notes (Signed)
@Patient  ID: Adam Alm., male    DOB: 1960/09/05, 57 y.o.   MRN: [id number]  Chief Complaint  Patient presents with  . Follow-up    2 week follow up     Referring provider: [name], MD  HPI:  57 year old male patient followed in our office for pulmonary hypertension (diagnosed in 2012).   PMH: Hypertensive glomerular nephritis, kidney transplant 2000, kidney transplant failed in 2009, patient is Monday Wednesday Friday dialysis, A. fib Smoker/ Smoking History: Never Smoker  Maintenance: Letairis, Adcirca Pt of: Dr. Lake Bells  Recent [facility] Encounters:   02/11/2018-office visit-[facility] Patient with increased shortness of breath this is new and worsening. Plan: D-dimer, lower extremity Doppler, CTA of your chest if d-dimer positive, echocardiogram stat, if all these tests are unimpressive we will order PFT, continue Letairis and Adcirca, follow-up next week  02/17/2018  - Visit - BM   57 year old male patient presenting today for one-week follow-up visit after seeing Dr. Lake Bells.  Patient's d-dimer was elevated patient completed CT Angio.  We will discuss these results today.  Unfortunately patient has not completed his echocardiogram that was ordered stat from last office visit. Patient reports that since May/2019 when he had a staph infection which required to significant hospitalizations and procedures patient is not fully recovered.  He reports that prior to that patient was doing well and was quite active.  Patient now has concerns that even after walking 10 feet he gets progressively more short of breath. Patient reports that he has significant lower extremity swelling, especially in left lower extremity.  Patient is being followed by surgery Dr. [name] for left leg wound.  Patient will see Dr. [name] tomorrow for this.  Patient reports adherence to Monday Wednesday Friday dialysis.  Patient reports that he is actually below his dry weight and they have not  had issues with pulling fluid off. Of note patient was confused he has not had blood cultures drawn.  Patient has had blood work completed.  Most recent being a CBC.  Showing stable anemia.  Plan: echo  03/03/2018  - Visit - BM 57 year old male patient presenting today for 2-week follow-up visit.  Patient has been seen in the hospital for known leg wound and elevated lactate.  Patient continues to have increased shortness of breath.  Patient has completed echocardiogram on 02/19/2018.  Echocardiogram showing reduced ejection fraction 30-35 percent.  Pt continues to complete MWF HD.  Plan: follow up with cardiology  03/17/2018  - Visit   57 year old male patient presenting today for follow-up visit.  Patient reports he is completed follow-up with cardiology.  Patient continues to be adherent with hemodialysis.  Patient reports that his dry weight is 119 kg.  Patient reports that every day with HD he gets off about 2 to 3 kg.  Patient also reports they have recently increase his dialysis to Monday, Tuesday, Thursday, Friday.  Patient currently does dialysis at home.  Patient reports that shortness of breath has not improved or worsened since last office visit.  Continues to have dyspnea with exertion after about 10 feet of walking.  Patient continues to have follow-up for his left thigh wound that is healing.  Patient reports next follow-up is in November/12/19.  Patient spouse would like patient's back looked as they believe that he has a mass on his left back and want to evaluate the insure it is not a cyst.    Tests:  02/19/2018-echocardiogram-LV ejection fraction 30 to 35%, diffuse hypokinesis, grade  1 diastolic dysfunction PAP pressure 61  >>>this is a significant change from April/2019 echocardiogram which had an LV ejection fraction of 60 to 65%  >>> Per chart review cardiologist [facility] reviewed echo discrete with read, cardiologist believes that LV ejection fraction is around 40 to  45%  02/16/2018-CT Angio - no evidence of PE, mild interstitial pulmonary edema, small right pleural effusion and enlarged heart, small pericardial effusion  02/11/2018- vascular lower extremity Doppler normal  6MW March 2017 6 minute walk test at [facility] 385 m, O2 saturation 100% on room air  Echo: March 2017 echocardiogram [facility] RVSP 40 mmHg, LVEF 55% March 2018 echo [facility]: RVSP 64mm Hg 08/2017 Echo RVSP 79mmHg  RHC April 2017 right heart catheterization [facility] cardiac index 3.7, right atrial pressure 5, RVSP 46, mean PA pressure 31, pulmonary capillary wedge pressure 11, pulmonary vascular resistance 2.2   FENO:  No results found for: NITRICOXIDE  PFT: No flowsheet data found.  Imaging: Ct Angio Chest W/cm &/or Wo Cm  Result Date: 02/16/2018 CLINICAL DATA:  57 year old male with shortness of breath for the past 6 weeks. Elevated D-dimer. Negative Doppler interrogation of his legs. EXAM: CT ANGIOGRAPHY CHEST WITH CONTRAST TECHNIQUE: Multidetector CT imaging of the chest was performed using the standard protocol during bolus administration of intravenous contrast. Multiplanar CT image reconstructions and MIPs were obtained to evaluate the vascular anatomy. CONTRAST:  187mL ISOVUE-370 IOPAMIDOL (ISOVUE-370) INJECTION 76% COMPARISON:  None. FINDINGS: Cardiovascular: No filling defects in the pulmonary arterial tree to suggest underlying pulmonary embolism. Heart size is mildly enlarged with right atrial dilatation. Small amount of pericardial fluid and/or thickening, most evident adjacent to the right atrium, unlikely to be of any hemodynamic significance at this time. No associated pericardial calcification. There is aortic atherosclerosis, as well as atherosclerosis of the great vessels of the mediastinum and the coronary arteries, including calcified atherosclerotic plaque in the left main, left anterior descending, left circumflex and right coronary arteries.  Severe calcifications of the aortic valve and mitral annulus. Mediastinum/Nodes: Several prominent borderline enlarged mediastinal lymph nodes are noted, most evident in the superior mediastinum. Esophagus is unremarkable in appearance. No axillary lymphadenopathy. Lungs/Pleura: Trace right pleural effusion lying dependently. No left pleural effusion. Widespread ground-glass attenuation and mild patchy interlobular septal thickening throughout the lungs bilaterally, favored to reflect a background of mild interstitial pulmonary edema. No consolidative airspace disease. No definite suspicious appearing pulmonary nodules or masses are noted. Upper Abdomen: Aortic atherosclerosis. Musculoskeletal: There are no aggressive appearing lytic or blastic lesions noted in the visualized portions of the skeleton. Review of the MIP images confirms the above findings. IMPRESSION: 1. No evidence of pulmonary embolism. 2. Findings in the lungs suggests mild interstitial pulmonary edema. Given the small right pleural effusion and enlarged heart, findings are favored to reflect congestive heart failure. 3. Cardiomegaly with right atrial dilatation. 4. Small pericardial effusion most evident over in the right atrium, unlikely to be of any hemodynamic significance at this time. 5. There are calcifications of the aortic valve and mitral annulus. Echocardiographic correlation for evaluation of potential valvular dysfunction may be warranted if clinically indicated. 6. Aortic atherosclerosis, in addition to left main and 3 vessel coronary artery disease. Please note that although the presence of coronary artery calcium documents the presence of coronary artery disease, the severity of this disease and any potential stenosis cannot be assessed on this non-gated CT examination. Assessment for potential risk factor modification, dietary therapy or pharmacologic therapy may be warranted, if  clinically indicated. Aortic Atherosclerosis  (ICD10-I70.0). Electronically Signed   By: Vinnie Langton M.D.   On: 02/16/2018 16:23   Dg Chest Port 1 View  Result Date: 02/18/2018 CLINICAL DATA:  Fever, chills, shortness of breath, open leg wound, history atrial fibrillation, thyroid cancer, end-stage renal disease on dialysis, non ischemic cardiomyopathy, pulmonary hypertension EXAM: PORTABLE CHEST 1 VIEW COMPARISON:  Portable exam 0714 hours compared to 11/10/2017 FINDINGS: Large bore LEFT jugular dialysis catheter with tip projecting over RIGHT atrium. Enlargement of cardiac silhouette with pulmonary vascular congestion. Minimal accentuation of perihilar interstitial markings likely reflects mild chronic failure. No acute infiltrates, pleural effusion or pneumothorax. Vascular stents identified at the LEFT axilla into LEFT upper extremity. Atherosclerotic calcification aorta. IMPRESSION: Enlargement of cardiac silhouette with pulmonary vascular congestion and minimal chronic failure. No new infiltrates identified. Electronically Signed   By: Lavonia Dana M.D.   On: 02/18/2018 07:49    Chart Review:  >>> Per chart review cardiologist [facility] reviewed echo discrete with read, cardiologist believes that LV ejection fraction is around 40 to 45%   Specialty Problems      Pulmonary Problems   Sinus congestion   Epistaxis    02/2016 ENT assessment> perforated nasal septum      HCAP (healthcare-associated pneumonia)   Acute respiratory failure with hypoxia (HCC)   Shortness of breath      Allergies  Allergen Reactions  . Enalapril Swelling and Other (See Comments)    MOUTH SWELLING EDEMA  . Iodinated Diagnostic Agents Nausea And Vomiting and Nausea Only    Immunization History  Administered Date(s) Administered  . Influenza Split 01/30/2014, 02/19/2016, 12/27/2016  . Influenza Whole 02/10/2015  . Influenza-Unspecified 01/12/2017, 02/08/2018  . Pneumococcal Polysaccharide-23 05/29/2014    Past Medical History:  Diagnosis Date   . Abscess 02/2018   LEFT THIGH   . AICD (automatic cardioverter/defibrillator) present    [employer] - s/p extraction 08/21/17 due to staph infection  . Anemia   . Aortic stenosis    moderate AS by 08/2016 echo  . Arthritis    "knees" (08/05/2016)  . Atrial fibrillation ([place])   . ESRD (end stage renal disease) on dialysis [facility])    ESRD due to HTN started HD 2003, had renal Tx 2009 for 18 mos. Was on NX stage hemodialysis as of 2018 but in late 2019 is back at [facility] incenter HD.   Marland Kitchen Great toe amputation status    status post left hallux amputation 08/01/14  . History of blood transfusion 2009   "S/P biopsy for prostate cancer check"  . Hypothyroidism (acquired)   . Mitral stenosis    moderate mitral stenosis  . Nonhealing surgical wound    left thigh  . Nonischemic cardiomyopathy ([place])   . Pneumonia 2014; 08/05/2016  . Pulmonary HTN ([place])   . Renal insufficiency   . Thyroid cancer ([place]) 2011    Tobacco History: Social History   Tobacco Use  Smoking Status Never Smoker  Smokeless Tobacco Never Used   Counseling given: Yes  Continue to not smoke.  Outpatient Encounter Medications as of 03/17/2018  Medication Sig  . albuterol (PROVENTIL) (2.5 MG/3ML) 0.083% nebulizer solution Take 3 mLs (2.5 mg total) by nebulization every 6 (six) hours as needed for wheezing or shortness of breath.  Marland Kitchen ambrisentan (LETAIRIS) 10 MG tablet Take 1 tablet (10 mg total) by mouth daily.  Marland Kitchen amiodarone (PACERONE) 200 MG tablet Take 200 mg by mouth daily.   Marland Kitchen apixaban (ELIQUIS) 2.5 MG TABS tablet  Take 1 tablet (2.5 mg total) by mouth 2 (two) times daily.  Marland Kitchen atorvastatin (LIPITOR) 40 MG tablet Take 1 tablet (40 mg total) by mouth every evening.  . cinacalcet (SENSIPAR) 90 MG tablet Take 90 mg by mouth every evening.  Marland Kitchen levothyroxine (SYNTHROID, LEVOTHROID) 300 MCG tablet Take 1 tablet (300 mcg total) by mouth daily before breakfast.  . midodrine (PROAMATINE) 10 MG tablet TAKE 1 TABLET (10 MG TOTAL) BY  MOUTH 3 (THREE) TIMES DAILY.  . multivitamin (RENA-VIT) TABS tablet TAKE 1 TABLET BY MOUTH EVERYDAY AT BEDTIME (Patient taking differently: Take 1 tablet by mouth at bedtime. )  . nystatin (MYCOSTATIN/NYSTOP) powder Apply topically 4 (four) times daily. (Patient taking differently: Apply 1 g topically daily. )  . pantoprazole (PROTONIX) 40 MG tablet Take 1 tablet (40 mg total) by mouth 2 (two) times daily.  . tadalafil (ADCIRCA/CIALIS) 20 MG tablet Take 20 mg by mouth daily.   . traMADol (ULTRAM) 50 MG tablet Take 1 tablet (50 mg total) by mouth every 6 (six) hours as needed.  . [DISCONTINUED] albuterol (PROVENTIL) (2.5 MG/3ML) 0.083% nebulizer solution Take 3 mLs (2.5 mg total) by nebulization every 6 (six) hours as needed for wheezing or shortness of breath.   No facility-administered encounter medications on file as of 03/17/2018.     Review of Systems  Review of Systems  Constitutional: Positive for fatigue. Negative for activity change, chills, fever and unexpected weight change.  HENT: Negative for postnasal drip, rhinorrhea, sneezing and sore throat.   Eyes: Negative.   Respiratory: Positive for shortness of breath (chronic ). Negative for cough and wheezing.   Cardiovascular: Positive for leg swelling. Negative for chest pain and palpitations.  Gastrointestinal: Negative for constipation, diarrhea, nausea and vomiting.  Endocrine: Negative.   Genitourinary:       MTThF HD at home   Musculoskeletal: Negative.   Skin: Rash: LE dry skin        Healing left thigh wound   Neurological: Negative for dizziness and headaches.  Psychiatric/Behavioral: Negative.  Negative for dysphoric mood. The patient is not nervous/anxious.   All other systems reviewed and are negative.    Physical Exam  BP (!) 98/56 (BP Location: Right Arm, Cuff Size: Normal)   Pulse (!) 106   Ht 6\' 1"  (1.854 m)   Wt 275 lb (124.7 kg)   SpO2 97%   BMI 36.28 kg/m   Wt Readings from Last 5 Encounters:   03/17/18 275 lb (124.7 kg)  03/08/18 266 lb (120.7 kg)  03/03/18 277 lb 6.4 oz (125.8 kg)  02/24/18 266 lb 15.6 oz (121.1 kg)  02/17/18 272 lb 12.8 oz (123.7 kg)    Physical Exam  Constitutional: He is oriented to person, place, and time and well-developed, well-nourished, and in no distress. No distress.  HENT:  Head: Normocephalic and atraumatic.  Right Ear: Hearing, tympanic membrane, external ear and ear canal normal.  Left Ear: Hearing, tympanic membrane, external ear and ear canal normal.  Nose: Nose normal. Right sinus exhibits no maxillary sinus tenderness and no frontal sinus tenderness. Left sinus exhibits no maxillary sinus tenderness and no frontal sinus tenderness.  Mouth/Throat: Uvula is midline and oropharynx is clear and moist. No oropharyngeal exudate.  Eyes: Pupils are equal, round, and reactive to light.  Neck: Normal range of motion. Neck supple. No JVD present.  Cardiovascular: Normal rate and regular rhythm. Exam reveals no gallop.  Murmur heard. Pulmonary/Chest: Effort normal and breath sounds normal. No accessory muscle usage.  No respiratory distress. He has no decreased breath sounds. He has no wheezes. He has no rhonchi.  Abdominal: Soft. Bowel sounds are normal. There is no tenderness.  Musculoskeletal: Normal range of motion. He exhibits no edema.  Lymphadenopathy:    He has no cervical adenopathy.  Neurological: He is alert and oriented to person, place, and time. Gait normal.  Skin: Skin is warm and dry. He is not diaphoretic. No erythema.  Psychiatric: Mood, memory, affect and judgment normal.  Nursing note and vitals reviewed.    Lab Results:  CBC    Component Value Date/Time   WBC 10.0 02/26/2018 0313   RBC 3.05 (L) 02/26/2018 0313   HGB 8.4 (L) 02/26/2018 0313   HGB 8.8 (L) 02/10/2018 1556   HCT 28.5 (L) 02/26/2018 0313   HCT 28.2 (L) 02/10/2018 1556   PLT 143 (L) 02/26/2018 0313   PLT 122 (L) 02/10/2018 1556   MCV 93.4 02/26/2018 0313    MCV 87 02/10/2018 1556   MCH 27.5 02/26/2018 0313   MCHC 29.5 (L) 02/26/2018 0313   RDW 23.2 (H) 02/26/2018 0313   RDW 17.7 (H) 02/10/2018 1556   LYMPHSABS 0.6 (L) 02/26/2018 0313   LYMPHSABS 0.5 (L) 02/10/2018 1556   MONOABS 0.5 02/26/2018 0313   EOSABS 0.0 02/26/2018 0313   EOSABS 0.1 02/10/2018 1556   BASOSABS 0.0 02/26/2018 0313   BASOSABS 0.0 02/10/2018 1556    BMET    Component Value Date/Time   NA 134 (L) 02/26/2018 0313   K 4.7 02/26/2018 0313   CL 97 (L) 02/26/2018 0313   CO2 25 02/26/2018 0313   GLUCOSE 99 02/26/2018 0313   BUN 34 (H) 02/26/2018 0313   CREATININE 5.28 (H) 02/26/2018 0313   CREATININE 4.91 (H) 08/13/2015 1223   CALCIUM 8.7 (L) 02/26/2018 0313   GFRNONAA 11 (L) 02/26/2018 0313   GFRNONAA 13 (L) 07/27/2014 1539   GFRAA 13 (L) 02/26/2018 0313   GFRAA 15 (L) 07/27/2014 1539    BNP No results found for: BNP  ProBNP No results found for: PROBNP    Assessment & Plan:   57 year old male patient completing follow-up office visit today.  Patient continues to have dyspnea on exertion.  Patient to follow-up with cardiology in November/2019.  Patient continue follow-up with hemodialysis.  Patient to continue pulmonary hypertension meds at this time.  Will get pulmonary function test.  Could consider addition of pulmonary vasodilators after we rule out any worsening lung function.  Patient to follow-up with Dr. Lake Bells in 4 to 6 weeks after completion of pulmonary function test and follow-up with cardiology.  Pulmonary hypertension (HCC) Continue pulmonary hypertension meds as prescribed >>>continue ambrisentan  >>>continue tadalafil  Pulmonary Function Test to be placed   Follow up with [facility] 4-6 weeks    Stage 5 chronic kidney disease on chronic dialysis [facility]) Keep follow up with HD - Monday Tuesday Thursday Friday    Shortness of breath Pulmonary function test to be ordered Refill albuterol nebulizers  This appointment was 43 minutes  along with over 50% that time direct face-to-face patient care, assessment, plan of care follow-up   Lauraine Rinne, NP 03/17/2018

## 2018-03-17 NOTE — Progress Notes (Signed)
Reviewed, agree 

## 2018-03-17 NOTE — Assessment & Plan Note (Signed)
Continue pulmonary hypertension meds as prescribed >>>continue ambrisentan  >>>continue tadalafil  Pulmonary Function Test to be placed   Follow up with [facility] 4-6 weeks

## 2018-03-17 NOTE — Telephone Encounter (Signed)
Called and spoke with wife in regards to scheduling appointments for patient with Aaron Edelman and Dr. Lake Bells.   Dr. Lake Bells first available is May 26 2018, will route to Burman Nieves to see if an earlier appt can be scheduled.  Patient scheduled with Wyn Quaker FNP 04/21/2018 for a 0900 PFT and 1000 appt.

## 2018-03-17 NOTE — Assessment & Plan Note (Signed)
Keep follow up with HD - Monday Tuesday Thursday Friday

## 2018-03-17 NOTE — Assessment & Plan Note (Signed)
Pulmonary function test to be ordered Refill albuterol nebulizers

## 2018-03-18 DIAGNOSIS — R7881 Bacteremia: Secondary | ICD-10-CM | POA: Diagnosis not present

## 2018-03-18 DIAGNOSIS — T8189XA Other complications of procedures, not elsewhere classified, initial encounter: Secondary | ICD-10-CM | POA: Diagnosis not present

## 2018-03-18 DIAGNOSIS — Z95810 Presence of automatic (implantable) cardiac defibrillator: Secondary | ICD-10-CM | POA: Diagnosis not present

## 2018-03-18 DIAGNOSIS — N186 End stage renal disease: Secondary | ICD-10-CM | POA: Diagnosis not present

## 2018-03-18 DIAGNOSIS — J9601 Acute respiratory failure with hypoxia: Secondary | ICD-10-CM | POA: Diagnosis not present

## 2018-03-18 DIAGNOSIS — T829XXA Unspecified complication of cardiac and vascular prosthetic device, implant and graft, initial encounter: Secondary | ICD-10-CM | POA: Diagnosis not present

## 2018-03-18 DIAGNOSIS — A419 Sepsis, unspecified organism: Secondary | ICD-10-CM | POA: Diagnosis not present

## 2018-03-18 DIAGNOSIS — T8130XA Disruption of wound, unspecified, initial encounter: Secondary | ICD-10-CM | POA: Diagnosis not present

## 2018-03-18 NOTE — Progress Notes (Signed)
Patient came in today for Eval/assessment for [employer] Balance Brace.  Patient presents with a history of falling, and also fearful of falling.  Balance tests performed and patient does have issues keeping balance especially in foot to heel test.  Patient has week dorsiflexors and well as inverters/everters muscle group.  Demonstrants ankle instability.   

## 2018-03-19 DIAGNOSIS — I9589 Other hypotension: Secondary | ICD-10-CM | POA: Diagnosis not present

## 2018-03-19 DIAGNOSIS — I120 Hypertensive chronic kidney disease with stage 5 chronic kidney disease or end stage renal disease: Secondary | ICD-10-CM | POA: Diagnosis not present

## 2018-03-19 DIAGNOSIS — I080 Rheumatic disorders of both mitral and aortic valves: Secondary | ICD-10-CM | POA: Diagnosis not present

## 2018-03-19 DIAGNOSIS — I700 Atherosclerosis of aorta: Secondary | ICD-10-CM | POA: Diagnosis not present

## 2018-03-19 DIAGNOSIS — T8189XD Other complications of procedures, not elsewhere classified, subsequent encounter: Secondary | ICD-10-CM | POA: Diagnosis not present

## 2018-03-19 DIAGNOSIS — I480 Paroxysmal atrial fibrillation: Secondary | ICD-10-CM | POA: Diagnosis not present

## 2018-03-19 DIAGNOSIS — D631 Anemia in chronic kidney disease: Secondary | ICD-10-CM | POA: Diagnosis not present

## 2018-03-19 DIAGNOSIS — N186 End stage renal disease: Secondary | ICD-10-CM | POA: Diagnosis not present

## 2018-03-19 DIAGNOSIS — I255 Ischemic cardiomyopathy: Secondary | ICD-10-CM | POA: Diagnosis not present

## 2018-03-23 ENCOUNTER — Other Ambulatory Visit: Payer: Self-pay

## 2018-03-23 ENCOUNTER — Encounter: Payer: Self-pay | Admitting: Vascular Surgery

## 2018-03-23 ENCOUNTER — Ambulatory Visit (INDEPENDENT_AMBULATORY_CARE_PROVIDER_SITE_OTHER): Payer: Self-pay | Admitting: Vascular Surgery

## 2018-03-23 DIAGNOSIS — S71102A Unspecified open wound, left thigh, initial encounter: Secondary | ICD-10-CM | POA: Insufficient documentation

## 2018-03-23 DIAGNOSIS — S71102D Unspecified open wound, left thigh, subsequent encounter: Secondary | ICD-10-CM

## 2018-03-23 MED ORDER — TRAMADOL HCL 50 MG PO TABS: 50.0000 mg | ORAL_TABLET | Freq: Four times a day (QID) | ORAL | 0 refills | Status: DC | PRN

## 2018-03-23 NOTE — Progress Notes (Signed)
Patient name: Adam [name]. MRN: [id number] DOB: 1961/05/03 Sex: male  REASON FOR VISIT: Wound check  HPI: Ashely Joshua. is a 57 y.o. male with multiple medical problems including end-stage renal disease that presents for follow-up wound check of a left thigh wound after excision of a sinus tract in his thigh back in August by Dr. [name].  Ultimately this wound has been very slow to heal.  He has undergone several additional debridements in the OR.  We tried Dakin's as well as Santyl to try and clean the wound up.  Overall the family feels like the wound is making progress but they have noticed some new debris in the wound edge over the last week or so.  No fevers or chills.  Separately his left arm hero graft is working well.  Past Medical History:  Diagnosis Date  . Abscess 02/2018   LEFT THIGH   . AICD (automatic cardioverter/defibrillator) present    [employer] - s/p extraction 08/21/17 due to staph infection  . Anemia   . Aortic stenosis    moderate AS by 08/2016 echo  . Arthritis    "knees" (08/05/2016)  . Atrial fibrillation ([place])   . ESRD (end stage renal disease) on dialysis [facility])    ESRD due to HTN started HD 2003, had renal Tx 2009 for 18 mos. Was on NX stage hemodialysis as of 2018 but in late 2019 is back at [facility] incenter HD.   Marland Kitchen Great toe amputation status    status post left hallux amputation 08/01/14  . History of blood transfusion 2009   "S/P biopsy for prostate cancer check"  . Hypothyroidism (acquired)   . Mitral stenosis    moderate mitral stenosis  . Nonhealing surgical wound    left thigh  . Nonischemic cardiomyopathy ([place])   . Pneumonia 2014; 08/05/2016  . Pulmonary HTN ([place])   . Renal insufficiency   . Thyroid cancer ([facility]) 2011    Past Surgical History:  Procedure Laterality Date  . A/V SHUNTOGRAM N/A 02/25/2018   Procedure: A/V SHUNTOGRAM;  Surgeon: Marty Heck, MD;  Location: [place] [facility];  Service: Cardiovascular;   Laterality: N/A;  lt arm fistula  . ARTERIAL LINE INSERTION Right 08/24/2017   Procedure: ARTERIAL LINE INSERTION INTO RIGHT FEMORAL ARTERY;  Surgeon: Conrad Fleming-Neon, MD;  Location: [place];  Service: Vascular;  Laterality: Right;  . BASCILIC VEIN TRANSPOSITION Right 10/13/2017   Procedure: FIRST STAGE BRACHIAL VEIN TRANSPOSITION RIGHT ARM;  Surgeon: Conrad Butte, MD;  Location: [place];  Service: Vascular;  Laterality: Right;  . BIOPSY  09/25/2017   Procedure: BIOPSY;  Surgeon: Ronnette Juniper, MD;  Location: [facility] ENDOSCOPY;  Service: Gastroenterology;;  . CARDIAC CATHETERIZATION Right 2017  . CARDIAC DEFIBRILLATOR PLACEMENT  2009  . DIALYSIS FISTULA CREATION Left 2003   "upper arm"  . DIALYSIS/PERMA CATHETER REMOVAL Left 09/01/2017   Procedure: DIALYSIS/PERMA CATHETER REMOVAL;  Surgeon: Conrad , MD;  Location: [place];  Service: Vascular;  Laterality: Left;  . ESOPHAGOGASTRODUODENOSCOPY (EGD) WITH PROPOFOL N/A 09/25/2017   Procedure: ESOPHAGOGASTRODUODENOSCOPY (EGD) WITH PROPOFOL;  Surgeon: Ronnette Juniper, MD;  Location: [place];  Service: Gastroenterology;  Laterality: N/A;  . HERNIA REPAIR    . I&D EXTREMITY Right 11/06/2017   Procedure: IRRIGATION AND DEBRIDEMENT KNEE;  Surgeon: Rod Can, MD;  Location: [place];  Service: Orthopedics;  Laterality: Right;  . I&D EXTREMITY Left 01/03/2018   Procedure: IRRIGATION AND DEBRIDEMENT OF THIGH, wound vac application  wound size 10cm x 2.5cm x 2.5cm;  Surgeon: Waynetta Sandy, MD;  Location: [place];  Service: Vascular;  Laterality: Left;  . ICD LEAD REMOVAL Right 08/20/2017   Procedure: ICD EXTRACTION WITH C-ARM;  Surgeon: Evans Lance, MD;  Location: [facility] OR;  Service: Cardiovascular;  Laterality: Right;  DR VAN TRIGT TO BACK-UP  . icd removed    . INSERTION OF DIALYSIS CATHETER N/A 11/11/2016   Procedure: INSERTION OF TUNNELED DIALYSIS CATHETER;  Surgeon: Angelia Mould, MD;  Location: [place];  Service: Vascular;  Laterality: N/A;  .  INSERTION OF DIALYSIS CATHETER Left 08/24/2017   Procedure: INSERTION OF DIALYSIS CATHETER;  Surgeon: Conrad [place], MD;  Location: [place];  Service: Vascular;  Laterality: Left;  . INSERTION OF DIALYSIS CATHETER Left 09/01/2017   Procedure: INSERTION OF DIALYSIS CATHETER;  Surgeon: Conrad [place], MD;  Location: [place];  Service: Vascular;  Laterality: Left;  . KIDNEY TRANSPLANT  2009  . KNEE SURGERY Bilateral 2000s   "drained fluid"  . LAPAROSCOPIC GASTRIC BANDING  2006  . LIGATION OF ARTERIOVENOUS  FISTULA Right 11/10/2017   Procedure: LIGATION OF BRACHIAL VEIN TRANSPOSITION RIGHT ARM;  Surgeon: Angelia Mould, MD;  Location: [place];  Service: Vascular;  Laterality: Right;  . MASS EXCISION N/A 01/12/2018   Procedure: EXCISION OF MULTIPLE SUBCUTANEOUS MASSES;  Surgeon: Ralene Ok, MD;  Location: [place];  Service: General;  Laterality: N/A;  . PROSTATE BIOPSY  2009  . REVISON OF ARTERIOVENOUS FISTULA Left 09/26/6158   Procedure: PLICATION OF ARTERIOVENOUS FISTULA ANEURYSM;  Surgeon: Elam Dutch, MD;  Location: [place];  Service: Vascular;  Laterality: Left;  . REVISON OF ARTERIOVENOUS FISTULA Left 11/10/2016   Procedure: REVISON OF ARTERIOVENOUS FISTULA - LEFT UPPER ARM;  Surgeon: Elam Dutch, MD;  Location: [place];  Service: Vascular;  Laterality: Left;  . REVISON OF ARTERIOVENOUS FISTULA Left 08/24/2017   Procedure: REVISON OF ARTERIOVENOUS FISTULA LEFT;  Surgeon: Conrad [place], MD;  Location: [place];  Service: Vascular;  Laterality: Left;  . TEE WITHOUT CARDIOVERSION N/A 08/20/2017   Procedure: TRANSESOPHAGEAL ECHOCARDIOGRAM (TEE);  Surgeon: Evans Lance, MD;  Location: [place];  Service: Cardiovascular;  Laterality: N/A;  . THROMBECTOMY W/ EMBOLECTOMY Left 12/25/2014   Procedure: THROMBECTOMY ARTERIOVENOUS FISTULA;  Surgeon: Elam Dutch, MD;  Location: [place];  Service: Vascular;  Laterality: Left;  . THYROIDECTOMY  2011  . TOE AMPUTATION Bilateral     right 1st and  2nd  digits; left 1st and 3rd digits"  . UMBILICAL HERNIA REPAIR  X 2  . UPPER EXTREMITY VENOGRAPHY Right 11/05/2017   Procedure: UPPER EXTREMITY VENOGRAPHY;  Surgeon: Conrad [place], MD;  Location: [place] [facility];  Service: Cardiovascular;  Laterality: Right;  Marland Kitchen VASCULAR ACCESS DEVICE INSERTION Left 11/10/2017   Procedure: INSERTION OF SUPER HERO VASCULAR ACCESS DEVICE LEFT ARM;  Surgeon: Angelia Mould, MD;  Location: [place];  Service: Vascular;  Laterality: Left;  Marland Kitchen VEIN HARVEST Left 08/24/2017   Procedure: VEIN HARVEST;  Surgeon: Conrad [place], MD;  Location: [place];  Service: Vascular;  Laterality: Left;  . WOUND DEBRIDEMENT Left 12/22/2017   Procedure: EXCISION OF LEFT THIGH WOUND;  Surgeon: Elam Dutch, MD;  Location: [place];  Service: Vascular;  Laterality: Left;  . WOUND DEBRIDEMENT Left 01/28/2018   Procedure: DEBRIDEMENT WOUND LEFT THIGH;  Surgeon: Marty Heck, MD;  Location: [place];  Service: Vascular;  Laterality: Left;  . WOUND  DEBRIDEMENT Right 02/23/2018   Procedure: DEBRIDEMENT AND IRRIGATION OF RIGHT FLANK WOUND;  Surgeon: Ralene Ok, MD;  Location: [place];  Service: General;  Laterality: Right;    Family History  Problem Relation Age of Onset  . Heart failure Mother   . Hypertension Mother   . CAD Mother 82  . Emphysema Mother   . Hypertension Father   . Kidney failure Father     SOCIAL HISTORY: Social History   Tobacco Use  . Smoking status: Never Smoker  . Smokeless tobacco: Never Used  Substance Use Topics  . Alcohol use: Yes    Alcohol/week: 0.0 standard drinks    Comment: social    Allergies  Allergen Reactions  . Enalapril Swelling and Other (See Comments)    MOUTH SWELLING EDEMA  . Iodinated Diagnostic Agents Nausea And Vomiting and Nausea Only    Current Outpatient Medications  Medication Sig Dispense Refill  . albuterol (PROVENTIL) (2.5 MG/3ML) 0.083% nebulizer solution Take 3 mLs (2.5 mg total) by nebulization every 6 (six)  hours as needed for wheezing or shortness of breath. 360 mL 5  . ambrisentan (LETAIRIS) 10 MG tablet Take 1 tablet (10 mg total) by mouth daily. 30 tablet 11  . amiodarone (PACERONE) 200 MG tablet Take 200 mg by mouth daily.     Marland Kitchen apixaban (ELIQUIS) 2.5 MG TABS tablet Take 1 tablet (2.5 mg total) by mouth 2 (two) times daily. 60 tablet 0  . atorvastatin (LIPITOR) 40 MG tablet Take 1 tablet (40 mg total) by mouth every evening. 30 tablet 2  . cinacalcet (SENSIPAR) 90 MG tablet Take 90 mg by mouth every evening.    Marland Kitchen levothyroxine (SYNTHROID, LEVOTHROID) 300 MCG tablet Take 1 tablet (300 mcg total) by mouth daily before breakfast. 90 tablet 3  . midodrine (PROAMATINE) 10 MG tablet TAKE 1 TABLET (10 MG TOTAL) BY MOUTH 3 (THREE) TIMES DAILY. 90 tablet 5  . multivitamin (RENA-VIT) TABS tablet TAKE 1 TABLET BY MOUTH EVERYDAY AT BEDTIME (Patient taking differently: Take 1 tablet by mouth at bedtime. ) 30 tablet 0  . nystatin (MYCOSTATIN/NYSTOP) powder Apply topically 4 (four) times daily. (Patient taking differently: Apply 1 g topically daily. ) 60 g 0  . pantoprazole (PROTONIX) 40 MG tablet Take 1 tablet (40 mg total) by mouth 2 (two) times daily. 30 tablet 1  . tadalafil (ADCIRCA/CIALIS) 20 MG tablet Take 20 mg by mouth daily.     . traMADol (ULTRAM) 50 MG tablet Take 1 tablet (50 mg total) by mouth every 6 (six) hours as needed. 20 tablet 0   No current facility-administered medications for this visit.     REVIEW OF SYSTEMS:  [X]  denotes positive finding, [ ]  denotes negative finding Cardiac  Comments:  Chest pain or chest pressure:    Shortness of breath upon exertion:    Short of breath when lying flat:    Irregular heart rhythm:        Vascular    Pain in calf, thigh, or hip brought on by ambulation:    Pain in feet at night that wakes you up from your sleep:     Blood clot in your veins:    Leg swelling:         Pulmonary    Oxygen at home:    Productive cough:     Wheezing:          Neurologic    Sudden weakness in arms or legs:     Sudden numbness  in arms or legs:     Sudden onset of difficulty speaking or slurred speech:    Temporary loss of vision in one eye:     Problems with dizziness:         Gastrointestinal    Blood in stool:     Vomited blood:         Genitourinary    Burning when urinating:     Blood in urine:        Psychiatric    Major depression:         Hematologic    Bleeding problems:    Problems with blood clotting too easily:        Skin    Rashes or ulcers:        Constitutional    Fever or chills:      PHYSICAL EXAM: Vitals:   03/23/18 1241  BP: (!) 80/53  Pulse: 78  Resp: 18  Temp: (!) 97 F (36.1 C)  TempSrc: Oral  SpO2: 98%  Weight: 122.9 kg  Height: 6\' 1"  (1.854 m)    GENERAL: The patient is a well-nourished male, in no acute distress. The vital signs are documented above. Left thigh wound 4 x 6 cm - shallow - some greenish fibrinous exudate on bottom edge of wound medially.  DATA:   None   Assessment/Plan:  57 year old male with multiple medical problems that presents for a wound check of a chronic nonhealing left thigh wound after excision of sinus tract by Dr. [name] back in August.  The wound has been very slow to heal even after several additional debridements in the OR.  At this time he has been using Santyl for some enzymatic debridement.  On evaluation today he has some green fibrinous exudate in the edge of the wound that is concerning for Pseudomonas.  I recommended that we put him back in Dakin's wet-to-dry twice a day.  I will have him come back in several weeks for another wound check and discussed that if no improvement and/or worsening at that time will need to go back to the OR for further debridement.   Marty Heck, MD [facility] Office: [phone] Pager: [place]

## 2018-03-24 DIAGNOSIS — I255 Ischemic cardiomyopathy: Secondary | ICD-10-CM | POA: Diagnosis not present

## 2018-03-24 DIAGNOSIS — T8189XD Other complications of procedures, not elsewhere classified, subsequent encounter: Secondary | ICD-10-CM | POA: Diagnosis not present

## 2018-03-24 DIAGNOSIS — D631 Anemia in chronic kidney disease: Secondary | ICD-10-CM | POA: Diagnosis not present

## 2018-03-24 DIAGNOSIS — I700 Atherosclerosis of aorta: Secondary | ICD-10-CM | POA: Diagnosis not present

## 2018-03-24 DIAGNOSIS — I9589 Other hypotension: Secondary | ICD-10-CM | POA: Diagnosis not present

## 2018-03-24 DIAGNOSIS — N186 End stage renal disease: Secondary | ICD-10-CM | POA: Diagnosis not present

## 2018-03-24 DIAGNOSIS — I480 Paroxysmal atrial fibrillation: Secondary | ICD-10-CM | POA: Diagnosis not present

## 2018-03-24 DIAGNOSIS — I120 Hypertensive chronic kidney disease with stage 5 chronic kidney disease or end stage renal disease: Secondary | ICD-10-CM | POA: Diagnosis not present

## 2018-03-24 DIAGNOSIS — I080 Rheumatic disorders of both mitral and aortic valves: Secondary | ICD-10-CM | POA: Diagnosis not present

## 2018-03-25 ENCOUNTER — Other Ambulatory Visit: Payer: Self-pay

## 2018-03-25 ENCOUNTER — Encounter: Payer: Self-pay | Admitting: Family Medicine

## 2018-03-25 ENCOUNTER — Inpatient Hospital Stay (HOSPITAL_COMMUNITY)
Admission: EM | Admit: 2018-03-25 | Discharge: 2018-03-29 | DRG: 291 | Disposition: A | Discharge status: Home / Self Care | Payer: Medicare HMO | Attending: Family Medicine | Admitting: Family Medicine

## 2018-03-25 ENCOUNTER — Emergency Department (HOSPITAL_COMMUNITY): Payer: Medicare HMO

## 2018-03-25 ENCOUNTER — Encounter (HOSPITAL_COMMUNITY): Payer: Self-pay | Admitting: *Deleted

## 2018-03-25 ENCOUNTER — Telehealth: Payer: Self-pay

## 2018-03-25 DIAGNOSIS — I440 Atrioventricular block, first degree: Secondary | ICD-10-CM | POA: Diagnosis present

## 2018-03-25 DIAGNOSIS — E785 Hyperlipidemia, unspecified: Secondary | ICD-10-CM | POA: Diagnosis present

## 2018-03-25 DIAGNOSIS — Z515 Encounter for palliative care: Secondary | ICD-10-CM

## 2018-03-25 DIAGNOSIS — Z992 Dependence on renal dialysis: Secondary | ICD-10-CM

## 2018-03-25 DIAGNOSIS — J810 Acute pulmonary edema: Secondary | ICD-10-CM

## 2018-03-25 DIAGNOSIS — Z7901 Long term (current) use of anticoagulants: Secondary | ICD-10-CM

## 2018-03-25 DIAGNOSIS — T8189XA Other complications of procedures, not elsewhere classified, initial encounter: Secondary | ICD-10-CM | POA: Diagnosis not present

## 2018-03-25 DIAGNOSIS — E669 Obesity, unspecified: Secondary | ICD-10-CM | POA: Diagnosis present

## 2018-03-25 DIAGNOSIS — N2581 Secondary hyperparathyroidism of renal origin: Secondary | ICD-10-CM | POA: Diagnosis not present

## 2018-03-25 DIAGNOSIS — Z8249 Family history of ischemic heart disease and other diseases of the circulatory system: Secondary | ICD-10-CM

## 2018-03-25 DIAGNOSIS — Z9884 Bariatric surgery status: Secondary | ICD-10-CM

## 2018-03-25 DIAGNOSIS — L97129 Non-pressure chronic ulcer of left thigh with unspecified severity: Secondary | ICD-10-CM | POA: Diagnosis present

## 2018-03-25 DIAGNOSIS — I132 Hypertensive heart and chronic kidney disease with heart failure and with stage 5 chronic kidney disease, or end stage renal disease: Secondary | ICD-10-CM | POA: Diagnosis not present

## 2018-03-25 DIAGNOSIS — I129 Hypertensive chronic kidney disease with stage 1 through stage 4 chronic kidney disease, or unspecified chronic kidney disease: Secondary | ICD-10-CM | POA: Diagnosis not present

## 2018-03-25 DIAGNOSIS — I083 Combined rheumatic disorders of mitral, aortic and tricuspid valves: Secondary | ICD-10-CM | POA: Diagnosis present

## 2018-03-25 DIAGNOSIS — D631 Anemia in chronic kidney disease: Secondary | ICD-10-CM | POA: Diagnosis not present

## 2018-03-25 DIAGNOSIS — N186 End stage renal disease: Secondary | ICD-10-CM | POA: Diagnosis not present

## 2018-03-25 DIAGNOSIS — I9589 Other hypotension: Secondary | ICD-10-CM | POA: Diagnosis not present

## 2018-03-25 DIAGNOSIS — I272 Pulmonary hypertension, unspecified: Secondary | ICD-10-CM

## 2018-03-25 DIAGNOSIS — E1143 Type 2 diabetes mellitus with diabetic autonomic (poly)neuropathy: Secondary | ICD-10-CM

## 2018-03-25 DIAGNOSIS — Z89412 Acquired absence of left great toe: Secondary | ICD-10-CM | POA: Diagnosis not present

## 2018-03-25 DIAGNOSIS — Z79891 Long term (current) use of opiate analgesic: Secondary | ICD-10-CM

## 2018-03-25 DIAGNOSIS — I428 Other cardiomyopathies: Secondary | ICD-10-CM | POA: Diagnosis present

## 2018-03-25 DIAGNOSIS — N189 Chronic kidney disease, unspecified: Secondary | ICD-10-CM

## 2018-03-25 DIAGNOSIS — Z85850 Personal history of malignant neoplasm of thyroid: Secondary | ICD-10-CM

## 2018-03-25 DIAGNOSIS — Z7189 Other specified counseling: Secondary | ICD-10-CM

## 2018-03-25 DIAGNOSIS — Z8619 Personal history of other infectious and parasitic diseases: Secondary | ICD-10-CM

## 2018-03-25 DIAGNOSIS — Z6833 Body mass index (BMI) 33.0-33.9, adult: Secondary | ICD-10-CM

## 2018-03-25 DIAGNOSIS — Z972 Presence of dental prosthetic device (complete) (partial): Secondary | ICD-10-CM

## 2018-03-25 DIAGNOSIS — Z841 Family history of disorders of kidney and ureter: Secondary | ICD-10-CM

## 2018-03-25 DIAGNOSIS — R0602 Shortness of breath: Secondary | ICD-10-CM | POA: Diagnosis present

## 2018-03-25 DIAGNOSIS — Z79899 Other long term (current) drug therapy: Secondary | ICD-10-CM

## 2018-03-25 DIAGNOSIS — T829XXA Unspecified complication of cardiac and vascular prosthetic device, implant and graft, initial encounter: Secondary | ICD-10-CM

## 2018-03-25 DIAGNOSIS — Z91041 Radiographic dye allergy status: Secondary | ICD-10-CM

## 2018-03-25 DIAGNOSIS — I482 Chronic atrial fibrillation, unspecified: Secondary | ICD-10-CM | POA: Diagnosis not present

## 2018-03-25 DIAGNOSIS — Z79890 Hormone replacement therapy (postmenopausal): Secondary | ICD-10-CM

## 2018-03-25 DIAGNOSIS — G4700 Insomnia, unspecified: Secondary | ICD-10-CM | POA: Diagnosis present

## 2018-03-25 DIAGNOSIS — K219 Gastro-esophageal reflux disease without esophagitis: Secondary | ICD-10-CM | POA: Diagnosis present

## 2018-03-25 DIAGNOSIS — R34 Anuria and oliguria: Secondary | ICD-10-CM | POA: Diagnosis present

## 2018-03-25 DIAGNOSIS — I5023 Acute on chronic systolic (congestive) heart failure: Secondary | ICD-10-CM

## 2018-03-25 DIAGNOSIS — Z940 Kidney transplant status: Secondary | ICD-10-CM | POA: Diagnosis not present

## 2018-03-25 DIAGNOSIS — E890 Postprocedural hypothyroidism: Secondary | ICD-10-CM | POA: Diagnosis present

## 2018-03-25 DIAGNOSIS — E8889 Other specified metabolic disorders: Secondary | ICD-10-CM | POA: Diagnosis present

## 2018-03-25 DIAGNOSIS — I2729 Other secondary pulmonary hypertension: Secondary | ICD-10-CM | POA: Diagnosis present

## 2018-03-25 DIAGNOSIS — R079 Chest pain, unspecified: Secondary | ICD-10-CM | POA: Diagnosis not present

## 2018-03-25 DIAGNOSIS — T829XXS Unspecified complication of cardiac and vascular prosthetic device, implant and graft, sequela: Secondary | ICD-10-CM | POA: Diagnosis not present

## 2018-03-25 DIAGNOSIS — Z888 Allergy status to other drugs, medicaments and biological substances status: Secondary | ICD-10-CM

## 2018-03-25 DIAGNOSIS — E8770 Fluid overload, unspecified: Secondary | ICD-10-CM | POA: Diagnosis not present

## 2018-03-25 LAB — BASIC METABOLIC PANEL
ANION GAP: 13 (ref 5–15)
BUN: 25 mg/dL — AB (ref 6–20)
CHLORIDE: 93 mmol/L — AB (ref 98–111)
CO2: 28 mmol/L (ref 22–32)
Calcium: 8.8 mg/dL — ABNORMAL LOW (ref 8.9–10.3)
Creatinine, Ser: 5.36 mg/dL — ABNORMAL HIGH (ref 0.61–1.24)
GFR calc Af Amer: 13 mL/min — ABNORMAL LOW (ref >60)
GFR calc non Af Amer: 11 mL/min — ABNORMAL LOW (ref >60)
GLUCOSE: 61 mg/dL — AB (ref 70–99)
POTASSIUM: 3.8 mmol/L (ref 3.5–5.1)
Sodium: 134 mmol/L — ABNORMAL LOW (ref 135–145)

## 2018-03-25 LAB — CBC
HEMATOCRIT: 27 % — AB (ref 39.0–52.0)
Hemoglobin: 7.8 g/dL — ABNORMAL LOW (ref 13.0–17.0)
MCH: 26.8 pg (ref 26.0–34.0)
MCHC: 28.9 g/dL — AB (ref 30.0–36.0)
MCV: 92.8 fL (ref 80.0–100.0)
PLATELETS: 112 10*3/uL — AB (ref 150–400)
RBC: 2.91 MIL/uL — AB (ref 4.22–5.81)
RDW: 21 % — ABNORMAL HIGH (ref 11.5–15.5)
WBC: 6.2 10*3/uL (ref 4.0–10.5)
nRBC: 0 % (ref 0.0–0.2)

## 2018-03-25 LAB — I-STAT TROPONIN, ED: TROPONIN I, POC: 0.01 ng/mL (ref 0.00–0.08)

## 2018-03-25 MED ORDER — ATORVASTATIN CALCIUM 40 MG PO TABS
40.0000 mg | ORAL_TABLET | Freq: Every evening | ORAL | Status: DC
  Administered 2018-03-26 – 2018-03-28 (×3): 40 mg via ORAL
  Filled 2018-03-25 (×3): qty 1

## 2018-03-25 MED ORDER — APIXABAN 2.5 MG PO TABS
2.5000 mg | ORAL_TABLET | Freq: Two times a day (BID) | ORAL | Status: DC
  Administered 2018-03-26 – 2018-03-29 (×8): 2.5 mg via ORAL
  Filled 2018-03-25 (×8): qty 1

## 2018-03-25 MED ORDER — NYSTATIN 100000 UNIT/GM EX POWD
1.0000 g | Freq: Every day | CUTANEOUS | Status: DC
  Administered 2018-03-26 – 2018-03-29 (×4): 1 g via TOPICAL
  Filled 2018-03-25: qty 15

## 2018-03-25 MED ORDER — AMBRISENTAN 5 MG PO TABS
10.0000 mg | ORAL_TABLET | Freq: Every day | ORAL | Status: DC
  Administered 2018-03-26 – 2018-03-29 (×4): 10 mg via ORAL
  Filled 2018-03-25 (×4): qty 2

## 2018-03-25 MED ORDER — PANTOPRAZOLE SODIUM 40 MG PO TBEC
40.0000 mg | DELAYED_RELEASE_TABLET | Freq: Two times a day (BID) | ORAL | Status: DC
  Administered 2018-03-26 – 2018-03-29 (×8): 40 mg via ORAL
  Filled 2018-03-25 (×8): qty 1

## 2018-03-25 MED ORDER — ACETAMINOPHEN 650 MG RE SUPP: 650.0000 mg | Freq: Four times a day (QID) | RECTAL | Status: DC | PRN

## 2018-03-25 MED ORDER — LEVOTHYROXINE SODIUM 100 MCG PO TABS
300.0000 ug | ORAL_TABLET | ORAL | Status: DC
  Administered 2018-03-26 – 2018-03-29 (×4): 300 ug via ORAL
  Filled 2018-03-25 (×4): qty 3

## 2018-03-25 MED ORDER — ALBUTEROL SULFATE (2.5 MG/3ML) 0.083% IN NEBU: 2.5000 mg | INHALATION_SOLUTION | Freq: Four times a day (QID) | RESPIRATORY_TRACT | Status: DC | PRN

## 2018-03-25 MED ORDER — AMIODARONE HCL 200 MG PO TABS
200.0000 mg | ORAL_TABLET | Freq: Every day | ORAL | Status: DC
  Administered 2018-03-26 – 2018-03-29 (×4): 200 mg via ORAL
  Filled 2018-03-25 (×5): qty 1

## 2018-03-25 MED ORDER — MIDODRINE HCL 5 MG PO TABS
10.0000 mg | ORAL_TABLET | Freq: Three times a day (TID) | ORAL | Status: DC
  Administered 2018-03-26 – 2018-03-29 (×9): 10 mg via ORAL
  Filled 2018-03-25 (×9): qty 2

## 2018-03-25 MED ORDER — ACETAMINOPHEN 325 MG PO TABS: 650.0000 mg | ORAL_TABLET | Freq: Four times a day (QID) | ORAL | Status: DC | PRN

## 2018-03-25 MED ORDER — RENA-VITE PO TABS
1.0000 | ORAL_TABLET | Freq: Every day | ORAL | Status: DC
  Administered 2018-03-26 – 2018-03-28 (×4): 1 via ORAL
  Filled 2018-03-25 (×4): qty 1

## 2018-03-25 MED ORDER — CINACALCET HCL 30 MG PO TABS
90.0000 mg | ORAL_TABLET | Freq: Every evening | ORAL | Status: DC
  Administered 2018-03-26 – 2018-03-28 (×3): 90 mg via ORAL
  Filled 2018-03-25 (×3): qty 3

## 2018-03-25 MED ORDER — TRAMADOL HCL 50 MG PO TABS
50.0000 mg | ORAL_TABLET | Freq: Four times a day (QID) | ORAL | Status: DC | PRN
  Administered 2018-03-26 – 2018-03-29 (×7): 50 mg via ORAL
  Filled 2018-03-25 (×6): qty 1

## 2018-03-25 NOTE — Telephone Encounter (Signed)
Spoke with Dr. Andy Gauss about pts mychart message below.  Per [facility], pt needs to be evaluated in the ED today.   I called pt and informed of this, and stressed the importance to go. Pt agreed with plan. Pt wanted an apt scheduled with [facility] as well for the future. Scheduled him for 11/27. Again, stressed the importance for patient to be evaluated in the ED.

## 2018-03-25 NOTE — ED Provider Notes (Signed)
[place] EMERGENCY DEPARTMENT Provider Note   CSN: [id number] Arrival date & time: 03/25/18  1923     History   Chief Complaint Chief Complaint  Patient presents with  . Leg Swelling    HPI Adam [name]. is a 57 y.o. male.  57 year old male with history of end-stage renal disease who does home dialysis presents with increasing lower extremity edema is now migrated to his lower abdomen.  Notes increasing dyspnea on exertion.  Patient skipped his home dialysis today.  Denies any fever or chills.  Some cough.  Patient denies any dizziness.  No recent history of blood loss.  It is better with rest no pleuritic component     Past Medical History:  Diagnosis Date  . Abscess 02/2018   LEFT THIGH   . AICD (automatic cardioverter/defibrillator) present    [employer] - s/p extraction 08/21/17 due to staph infection  . Anemia   . Aortic stenosis    moderate AS by 08/2016 echo  . Arthritis    "knees" (08/05/2016)  . Atrial fibrillation ([place])   . ESRD (end stage renal disease) on dialysis [facility])    ESRD due to HTN started HD 2003, had renal Tx 2009 for 18 mos. Was on NX stage hemodialysis as of 2018 but in late 2019 is back at [facility] incenter HD.   Marland Kitchen Great toe amputation status    status post left hallux amputation 08/01/14  . History of blood transfusion 2009   "S/P biopsy for prostate cancer check"  . Hypothyroidism (acquired)   . Mitral stenosis    moderate mitral stenosis  . Nonhealing surgical wound    left thigh  . Nonischemic cardiomyopathy ([place])   . Pneumonia 2014; 08/05/2016  . Pulmonary HTN ([place])   . Renal insufficiency   . Thyroid cancer [facility]) 2011    Patient Active Problem List   Diagnosis Date Noted  . Open wound of left thigh 03/23/2018  . Hypokalemia   . ESRD on dialysis ([place])   . Insomnia 02/11/2018  . Chills 02/11/2018  . Shortness of breath 02/11/2018  . Leukopenia   . Wound infection 01/02/2018  . Nodule of chest wall  11/18/2017  . Calciphylaxis   . Abscess   . Acute pain of right knee   . Malnutrition of moderate degree 11/04/2017  . Septic arthritis of knee, right ([place]) 11/02/2017  . Dialysis AV fistula infection, sequela 11/02/2017  . Mass 10/29/2017  . Near syncope 09/24/2017  . Syncope, cardiogenic   . ESRD on hemodialysis ([place])   . Anemia 09/16/2017  . Diarrhea 09/16/2017  . Tarry stools 09/16/2017  . Acute encephalopathy   . Hypotension   . Complication of arteriovenous dialysis fistula   . Stage 5 chronic kidney disease on chronic dialysis ([place])   . Infected defibrillator ([place])   . MSSA bacteremia 08/18/2017  . Sepsis ([place]) 08/17/2017  . Lymphadenopathy, mediastinal 04/10/2017  . Hypothyroidism 10/09/2016  . Acute respiratory failure with hypoxia ([place]) 08/06/2016  . HCAP (healthcare-associated pneumonia) 08/05/2016  . Thyroid cancer ([place]) 06/12/2016  . Epistaxis 03/04/2016  . Long term current use of amiodarone 01/10/2016  . Critical lower limb ischemia 12/04/2015  . Carotid artery stenosis 05/24/2015  . Excess skin 05/24/2015  . 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 Status post amputation 04/19/2015  . Wound dehiscence 04/19/2015  . Sinus congestion 03/08/2015  . Bleeding pseudoaneurysm of left brachiocephalic arteriovenous fistula ([place]) 12/24/2014  . Pure hypercholesterolemia 11/09/2014  .  Obesity 11/07/2014  . Healthcare maintenance 11/07/2014  . H/O malignant neoplasm of thyroid 11/07/2014  . Toe amputation status 10/13/2014  . ICD (implantable cardioverter-defibrillator) in place 07/25/2014  . ESRD (end stage renal disease) on hemodialysis 07/25/2014  . End-stage renal disease ([place]) 07/25/2014  . Automatic implantable cardioverter-defibrillator in situ 07/25/2014  . Pulmonary hypertension ([place]) February 10, 202016  . Secondary pulmonary hypertension February 10, 202016  . Atrial fibrillation [I48.91] 05/24/2014  . Long term current use of anticoagulant therapy 05/24/2014     Past Surgical History:  Procedure Laterality Date  . A/V SHUNTOGRAM N/A 02/25/2018   Procedure: A/V SHUNTOGRAM;  Surgeon: Marty Heck, MD;  Location: [place] CV LAB;  Service: Cardiovascular;  Laterality: N/A;  lt arm fistula  . ARTERIAL LINE INSERTION Right 08/24/2017   Procedure: ARTERIAL LINE INSERTION INTO RIGHT FEMORAL ARTERY;  Surgeon: Conrad [place], MD;  Location: [place];  Service: Vascular;  Laterality: Right;  . BASCILIC VEIN TRANSPOSITION Right 10/13/2017   Procedure: FIRST STAGE BRACHIAL VEIN TRANSPOSITION RIGHT ARM;  Surgeon: Conrad [place], MD;  Location: [place];  Service: Vascular;  Laterality: Right;  . BIOPSY  09/25/2017   Procedure: BIOPSY;  Surgeon: Ronnette Juniper, MD;  Location: [facility] ENDOSCOPY;  Service: Gastroenterology;;  . CARDIAC CATHETERIZATION Right 2017  . CARDIAC DEFIBRILLATOR PLACEMENT  2009  . DIALYSIS FISTULA CREATION Left 2003   "upper arm"  . DIALYSIS/PERMA CATHETER REMOVAL Left 09/01/2017   Procedure: DIALYSIS/PERMA CATHETER REMOVAL;  Surgeon: Conrad [place], MD;  Location: [place];  Service: Vascular;  Laterality: Left;  . ESOPHAGOGASTRODUODENOSCOPY (EGD) WITH PROPOFOL N/A 09/25/2017   Procedure: ESOPHAGOGASTRODUODENOSCOPY (EGD) WITH PROPOFOL;  Surgeon: Ronnette Juniper, MD;  Location: [facility];  Service: Gastroenterology;  Laterality: N/A;  . HERNIA REPAIR    . I&D EXTREMITY Right 11/06/2017   Procedure: IRRIGATION AND DEBRIDEMENT KNEE;  Surgeon: Rod Can, MD;  Location: [place];  Service: Orthopedics;  Laterality: Right;  . I&D EXTREMITY Left 01/03/2018   Procedure: IRRIGATION AND DEBRIDEMENT OF THIGH, wound vac application  wound size 10cm x 2.5cm x 2.5cm;  Surgeon: Waynetta Sandy, MD;  Location: [place];  Service: Vascular;  Laterality: Left;  . ICD LEAD REMOVAL Right 08/20/2017   Procedure: ICD EXTRACTION WITH C-ARM;  Surgeon: Evans Lance, MD;  Location: [facility] OR;  Service: Cardiovascular;  Laterality: Right;  DR VAN TRIGT TO BACK-UP  . icd  removed    . INSERTION OF DIALYSIS CATHETER N/A 11/11/2016   Procedure: INSERTION OF TUNNELED DIALYSIS CATHETER;  Surgeon: Angelia Mould, MD;  Location: [place];  Service: Vascular;  Laterality: N/A;  . INSERTION OF DIALYSIS CATHETER Left 08/24/2017   Procedure: INSERTION OF DIALYSIS CATHETER;  Surgeon: Conrad [place], MD;  Location: [place];  Service: Vascular;  Laterality: Left;  . INSERTION OF DIALYSIS CATHETER Left 09/01/2017   Procedure: INSERTION OF DIALYSIS CATHETER;  Surgeon: Conrad [place], MD;  Location: [place];  Service: Vascular;  Laterality: Left;  . KIDNEY TRANSPLANT  2009  . KNEE SURGERY Bilateral 2000s   "drained fluid"  . LAPAROSCOPIC GASTRIC BANDING  2006  . LIGATION OF ARTERIOVENOUS  FISTULA Right 11/10/2017   Procedure: LIGATION OF BRACHIAL VEIN TRANSPOSITION RIGHT ARM;  Surgeon: Angelia Mould, MD;  Location: [place];  Service: Vascular;  Laterality: Right;  . MASS EXCISION N/A 01/12/2018   Procedure: EXCISION OF MULTIPLE SUBCUTANEOUS MASSES;  Surgeon: Ralene Ok, MD;  Location: [place];  Service: General;  Laterality: N/A;  . PROSTATE BIOPSY  2009  . REVISON OF ARTERIOVENOUS FISTULA Left 08/21/8784   Procedure: PLICATION OF ARTERIOVENOUS FISTULA ANEURYSM;  Surgeon: Elam Dutch, MD;  Location: [place];  Service: Vascular;  Laterality: Left;  . REVISON OF ARTERIOVENOUS FISTULA Left 11/10/2016   Procedure: REVISON OF ARTERIOVENOUS FISTULA - LEFT UPPER ARM;  Surgeon: Elam Dutch, MD;  Location: [place];  Service: Vascular;  Laterality: Left;  . REVISON OF ARTERIOVENOUS FISTULA Left 08/24/2017   Procedure: REVISON OF ARTERIOVENOUS FISTULA LEFT;  Surgeon: Conrad [place], MD;  Location: [place];  Service: Vascular;  Laterality: Left;  . TEE WITHOUT CARDIOVERSION N/A 08/20/2017   Procedure: TRANSESOPHAGEAL ECHOCARDIOGRAM (TEE);  Surgeon: Evans Lance, MD;  Location: [place];  Service: Cardiovascular;  Laterality: N/A;  . THROMBECTOMY W/ EMBOLECTOMY Left 12/25/2014    Procedure: THROMBECTOMY ARTERIOVENOUS FISTULA;  Surgeon: Elam Dutch, MD;  Location: [place];  Service: Vascular;  Laterality: Left;  . THYROIDECTOMY  2011  . TOE AMPUTATION Bilateral     right 1st and  2nd digits; left 1st and 3rd digits"  . UMBILICAL HERNIA REPAIR  X 2  . UPPER EXTREMITY VENOGRAPHY Right 11/05/2017   Procedure: UPPER EXTREMITY VENOGRAPHY;  Surgeon: Conrad [place], MD;  Location: [place] CV LAB;  Service: Cardiovascular;  Laterality: Right;  Marland Kitchen VASCULAR ACCESS DEVICE INSERTION Left 11/10/2017   Procedure: INSERTION OF SUPER HERO VASCULAR ACCESS DEVICE LEFT ARM;  Surgeon: Angelia Mould, MD;  Location: [place];  Service: Vascular;  Laterality: Left;  Marland Kitchen VEIN HARVEST Left 08/24/2017   Procedure: VEIN HARVEST;  Surgeon: Conrad Cerulean, MD;  Location: [place];  Service: Vascular;  Laterality: Left;  . WOUND DEBRIDEMENT Left 12/22/2017   Procedure: EXCISION OF LEFT THIGH WOUND;  Surgeon: Elam Dutch, MD;  Location: [place];  Service: Vascular;  Laterality: Left;  . WOUND DEBRIDEMENT Left 01/28/2018   Procedure: DEBRIDEMENT WOUND LEFT THIGH;  Surgeon: Marty Heck, MD;  Location: [place];  Service: Vascular;  Laterality: Left;  . WOUND DEBRIDEMENT Right 02/23/2018   Procedure: DEBRIDEMENT AND IRRIGATION OF RIGHT FLANK WOUND;  Surgeon: Ralene Ok, MD;  Location: [place];  Service: General;  Laterality: Right;        Home Medications    Prior to Admission medications   Medication Sig Start Date End Date Taking? Authorizing Provider  albuterol (PROVENTIL) (2.5 MG/3ML) 0.083% nebulizer solution Take 3 mLs (2.5 mg total) by nebulization every 6 (six) hours as needed for wheezing or shortness of breath. 03/17/18   Lauraine Rinne, NP  ambrisentan (LETAIRIS) 10 MG tablet Take 1 tablet (10 mg total) by mouth daily. 09/11/17   Juanito Doom, MD  amiodarone (PACERONE) 200 MG tablet Take 200 mg by mouth daily.     [provider]  apixaban (ELIQUIS) 2.5 MG TABS  tablet Take 1 tablet (2.5 mg total) by mouth 2 (two) times daily. 01/29/18   Ulyses Amor, PA-C  atorvastatin (LIPITOR) 40 MG tablet Take 1 tablet (40 mg total) by mouth every evening. 12/11/17   Diallo, Earna Coder, MD  cinacalcet (SENSIPAR) 90 MG tablet Take 90 mg by mouth every evening.    [provider]  levothyroxine (SYNTHROID, LEVOTHROID) 300 MCG tablet Take 1 tablet (300 mcg total) by mouth daily before breakfast. 09/26/17   Shirley, [place], DO  midodrine (PROAMATINE) 10 MG tablet TAKE 1 TABLET (10 MG TOTAL) BY MOUTH 3 (THREE) TIMES DAILY. 10/08/17   Minus Breeding, MD  multivitamin (RENA-VIT) TABS tablet  TAKE 1 TABLET BY MOUTH EVERYDAY AT BEDTIME Patient taking differently: Take 1 tablet by mouth at bedtime.  01/12/18   Diallo, Earna Coder, MD  nystatin (MYCOSTATIN/NYSTOP) powder Apply topically 4 (four) times daily. Patient taking differently: Apply 1 g topically daily.  11/18/17   Diallo, Earna Coder, MD  pantoprazole (PROTONIX) 40 MG tablet Take 1 tablet (40 mg total) by mouth 2 (two) times daily. 02/26/18 02/26/19  Guadalupe Dawn, MD  tadalafil (ADCIRCA/CIALIS) 20 MG tablet Take 20 mg by mouth daily.  10/06/17   [provider]  traMADol (ULTRAM) 50 MG tablet Take 1 tablet (50 mg total) by mouth every 6 (six) hours as needed. 03/23/18 03/23/19  Marty Heck, MD    Family History Family History  Problem Relation Age of Onset  . Heart failure Mother   . Hypertension Mother   . CAD Mother 69  . Emphysema Mother   . Hypertension Father   . Kidney failure Father     Social History Social History   Tobacco Use  . Smoking status: Never Smoker  . Smokeless tobacco: Never Used  Substance Use Topics  . Alcohol use: Yes    Alcohol/week: 0.0 standard drinks    Comment: social  . Drug use: No     Allergies   Enalapril and Iodinated diagnostic agents   Review of Systems Review of Systems  All other systems reviewed and are negative.    Physical  Exam Updated Vital Signs BP (!) 152/121 (BP Location: Left Arm)   Pulse 73   Temp 97.7 F (36.5 C) (Oral)   Resp 18   SpO2 100%   Physical Exam  Constitutional: He is oriented to person, place, and time. He appears well-developed and well-nourished.  Non-toxic appearance. No distress.  HENT:  Head: Normocephalic and atraumatic.  Eyes: Pupils are equal, round, and reactive to light. Conjunctivae, EOM and lids are normal.  Neck: Normal range of motion. Neck supple. No tracheal deviation present. No thyroid mass present.  Cardiovascular: Normal rate, regular rhythm and normal heart sounds. Exam reveals no gallop.  No murmur heard. Pulmonary/Chest: Effort normal and breath sounds normal. No stridor. No respiratory distress. He has no decreased breath sounds. He has no wheezes. He has no rhonchi. He has no rales.  Abdominal: Soft. Normal appearance and bowel sounds are normal. He exhibits no distension. There is no tenderness. There is no rebound and no CVA tenderness.  Musculoskeletal: Normal range of motion. He exhibits no edema or tenderness.  3+ bilateral lower extremity pitting edema.  Wound with granulation tissue at proximal anterior left thigh.  No surrounding infection.  Neurological: He is alert and oriented to person, place, and time. He has normal strength. No cranial nerve deficit or sensory deficit. GCS eye subscore is 4. GCS verbal subscore is 5. GCS motor subscore is 6.  Skin: Skin is warm and dry. No abrasion and no rash noted.  Psychiatric: He has a normal mood and affect. His speech is normal and behavior is normal.  Nursing note and vitals reviewed.    ED Treatments / Results  Labs (all labs ordered are listed, but only abnormal results are displayed) Labs Reviewed  BASIC METABOLIC PANEL - Abnormal; Notable for the following components:      Result Value   Sodium 134 (*)    Chloride 93 (*)    Glucose, Bld 61 (*)    BUN 25 (*)    Creatinine, Ser 5.36 (*)     Calcium 8.8 (*)  GFR calc non Af Amer 11 (*)    GFR calc Af Amer 13 (*)    All other components within normal limits  CBC - Abnormal; Notable for the following components:   RBC 2.91 (*)    Hemoglobin 7.8 (*)    HCT 27.0 (*)    MCHC 28.9 (*)    RDW 21.0 (*)    Platelets 112 (*)    All other components within normal limits  I-STAT TROPONIN, ED    EKG EKG Interpretation  Date/Time:  Thursday March 25 2018 20:24:03 EST Ventricular Rate:  76 PR Interval:  210 QRS Duration: 98 QT Interval:  478 QTC Calculation: 537 R Axis:   135 Text Interpretation:  Sinus rhythm with 1st degree A-V block with Premature supraventricular complexes Low voltage QRS Cannot rule out Inferior infarct , age undetermined Anterolateral infarct , age undetermined Abnormal ECG No significant change since last tracing Confirmed by Lacretia Leigh [phone]) on 03/25/2018 9:20:58 PM   Radiology Dg Chest 2 View  Result Date: 03/25/2018 CLINICAL DATA:  Shortness of breath and chest pain EXAM: CHEST - 2 VIEW COMPARISON:  02/18/2018 FINDINGS: Hero graft is again identified on the left. Prior venous stenting is noted as well. The lungs are clear bilaterally. The cardiac shadow is again enlarged but stable. Aortic calcifications are again seen. Mild vascular congestion is again noted. No bony abnormality is seen. IMPRESSION: Mild vascular congestion consistent with mild fluid overload related to end-stage renal disease. No new focal abnormality is seen. Electronically Signed   By: Inez Catalina M.D.   On: 03/25/2018 21:26    Procedures Procedures (including critical care time)  Medications Ordered in ED Medications - No data to display   Initial Impression / Assessment and Plan / ED Course  I have reviewed the triage vital signs and the nursing notes.  Pertinent labs & imaging results that were available during my care of the patient were reviewed by me and considered in my medical decision making (see chart for  details).     Blood pressure noted he is normally hypotensive.  Chest x-ray consistent with fluid overload.  Will consult family medicine for admission.  We will also consult nephrology as well  Final Clinical Impressions(s) / ED Diagnoses   Final diagnoses:  None    ED Discharge Orders    None       Lacretia Leigh, MD 03/25/18 2140

## 2018-03-25 NOTE — Telephone Encounter (Signed)
[name], Adam Duval  to [name], MD   03/25/18 11:37 AM Hello I need to see Dr Andy Gauss ASAP. I have a swollen left leg that he is aware of due to my wound but for some reason for the last two days i noticed the swelling is travelling upward to my upper body. My whole left side upper body is really blown up and is very painful and i dont know whats making it hurt and swell. Im nervous whatever it is it wil travel to my heart and would like to be seen today if possible .  Adam [name]

## 2018-03-25 NOTE — H&P (Addendum)
[facility] Admission History and Physical Service Pager: [phone]  Patient name: Adam [name]. Medical record number: [id number] Date of birth: 08/31/1960 Age: 57 y.o. Gender: male  Primary Care Provider: [name], MD Consultants: Nephrology  Code Status: FULL  Chief Complaint: Lower extremity and abdominal swelling     Assessment and Plan: Adam [name]. is a 57 y.o. male presenting with lower extremity and abdominal swelling with shortness of breath. PMH is significant for ESRD on HD MTRF,open wound of left thigh, anemia of chronic disease, HLD, obesity s/p bariatric surgery, hypotension, insomnia and atrial fib.  Lower extremity and abdominal swelling: Vitals stable on arrival with BP 82/61 (baseline for him is 70/60s), RR 14, HR 70.  The patient is presenting with 1 day of progressively worsening lower extremity and abdominal swelling accompanied by a recent change in dialysis schedule.  Chest x-ray on admission shows mild vascular congestion consistent with mild fluid overload related to end-stage renal disease.  On interview the patient has an occasional dry cough and denies shortness of breath while at rest.  He is satting at 93% on room air but is not in respiratory distress and appears comfortable on exam.  Patient had an echo in October 2019 showing reduction in ejection fraction from prior to 30 to 35% with grade 1 diastolic dysfunction.  Fluid collection is most likely due to recently altered dialysis schedule.  Dietary noncompliance is also in the differential for the acutely worsening swelling.  Newer onset heart failure with reduced ejection fraction is most likely also contributing to the patient's lower extremity swelling.   -Admit to med-surg, attending Dr. Andria Frames -Nephro consulted in the ED, Dr. Justin Mend to see him 11/15 - appreciate recs -Tylenol PRN mild pain, tramadol 50 mg every 6 as needed moderate pain -albuterol nebulizer  q6 PRN wheezing/shortness of breath  -Monitor fluid status -AM BMP, CBC -AM EKG -Up with assistance -Continuous cardiac and pulse ox monitoring -Strict I's and O's -Daily weights -vitals per protocol  ESRD on HD: The patient has a history of end-stage renal disease for which he is on dialysis at home on the schedule Monday, Tuesday, Thursday, and Friday.  The patient has a hero graft of his left upper extremity placed approximately 2 months prior. -Nephrology consulted in the ED, appreciate recs  -Plan for hemodialysis 11/15 -Renal diet with fluid restriction 1200 cc  HFrEF: Echo 02/19/2018 EF 30-35% with G1DD and diffuse hypokinesis. Before then echo from April 2019 was EF 60-65% with G1DD. Possible new reduction in EF may be contributing to his swelling, will await nephrology recs.  -Continue to monitor -Hemodialysis on 11/15   Open wound of left thigh: The patient has a wound to his left anterior thigh that started after an excision of a sinus tract of the thigh in August 2019.  The wound has been very slow to heal and he is undergone several additional debridements in the operating room.  The wound was most recently concerning for Pseudomonas due to some green fibrinous exudate on the wound edge. He has used Control and instrumentation engineer treatment.  He follows with Dr. Carlis Abbott of [facility].  -Wound care consulted   Hypotension: Blood pressures on admission are measuring in the 70-80/50-60.  This is a very normal blood pressure range for this patient.  Patient takes midodrine at home. -Continue midodrine 10 mg 3 times daily with meals  -Vitals per protocol   A. fib: Patient  has a history of such, not in A. Fib during exam.  Heart rate in the 80s. Patient takes amiodarone 200 mg daily and Eliquis 2.5 mg twice daily. -Cardiac monitoring -Continue amiodarone and Eliquis  Pulmonary hypertension: Patient has a history of such. Takes ambrisentan 10 mg daily.  -Continue  ambrisentan   Hypothyroidism: Patient has a history of hypothyroidism. He takes 300 mcg levothyroxine daily.  Most recent TSH in October 2019 is 3.781. -Continue levothyroxine daily   GERD: Patient has a history of such.  Denies symptoms during interview.  Patient takes Nexium at home. -Protonix  FEN/GI: Saline lock, renal diet with fluid restriction Prophylaxis: Home Eliquis 2.5 mg daily   Disposition: Admit to med-surg   History of Present Illness:  Adam [name]. is a 57 y.o. male presenting with shortness of breath and fluid overload that began the morning of 11/14.  The patient has end-stage renal disease with anuria and is on hemodialysis on Monday, Tuesday, Thursday, and Friday at home.  He used to be MWF at dialysis at [facility] in [place].  Most recently, the patient missed his dialysis Tuesday due to "we had some thing else going on" and instead was dialyzed Wednesday.  The nurse Wednesday noted there was "lots of bleeding" from his HeRo Graft.  They were able to complete a full session of dialysis, although it was a long, drawn out session. The patient was advised by his nurse to get his fistula a break and to dialyze again Friday and Saturday.  The patient woke up today, Thursday, noticed increased left extremity swelling, shortness of breath on exertion, fluid overload, and left sided abdominal swelling.  He states he was getting shortness of breath with walking only 10-12 steps, he decreased from his baseline.  He also has a dry, nonproductive cough since this morning as well.  He reports everything was normal last night.  Otherwise, he denies chest pain, palpitations, nausea, vomiting, diarrhea, and constipation.  He does not make urine anymore.  He also denies fevers and chills.   The patient reports he has had a paracentesis in the past.  It is unknown how much fluid was removed.  He denies any history of liver problems.    Has had doppler studies done of lower extremities  in the past which confirm poor circulation but no clot.   The patient reports in April 2019 he had a Staph infection which destroyed his fistula, as well as his AICD.  Both the old fistula and his AICD were removed at that time.  He had a HeRo Graft placed in his L shoulder .     Review Of Systems: Per HPI with the following additions:  Review of Systems  Constitutional: Negative for chills and fever.  Respiratory: Positive for cough and shortness of breath. Negative for hemoptysis, sputum production and wheezing.   Cardiovascular: Positive for leg swelling. Negative for chest pain and palpitations.  Gastrointestinal: Negative for abdominal pain, blood in stool, constipation, diarrhea, nausea and vomiting.  Genitourinary: Negative for dysuria.    Patient Active Problem List   Diagnosis Date Noted  . Open wound of left thigh 03/23/2018  . Hypokalemia   . ESRD on dialysis ([place])   . Insomnia 02/11/2018  . Chills 02/11/2018  . Shortness of breath 02/11/2018  . Leukopenia   . Wound infection 01/02/2018  . Nodule of chest wall 11/18/2017  . Calciphylaxis   . Abscess   . Acute pain of right knee   .  Malnutrition of moderate degree 11/04/2017  . Septic arthritis of knee, right ([place]) 11/02/2017  . Dialysis AV fistula infection, sequela 11/02/2017  . Mass 10/29/2017  . Near syncope 09/24/2017  . Syncope, cardiogenic   . ESRD on hemodialysis ([place])   . Anemia 09/16/2017  . Diarrhea 09/16/2017  . Tarry stools 09/16/2017  . Acute encephalopathy   . Hypotension   . Complication of arteriovenous dialysis fistula   . Stage 5 chronic kidney disease on chronic dialysis ([place])   . Infected defibrillator ([place])   . MSSA bacteremia 08/18/2017  . Sepsis ([place]) 08/17/2017  . Lymphadenopathy, mediastinal 04/10/2017  . Hypothyroidism 10/09/2016  . Acute respiratory failure with hypoxia ([place]) 08/06/2016  . HCAP (healthcare-associated pneumonia) 08/05/2016  . Thyroid cancer ([place]) 06/12/2016  .  Epistaxis 03/04/2016  . Long term current use of amiodarone 01/10/2016  . Critical lower limb ischemia 12/04/2015  . Carotid artery stenosis 05/24/2015  . Excess skin 05/24/2015  . 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 Status post amputation 04/19/2015  . Wound dehiscence 04/19/2015  . Sinus congestion 03/08/2015  . Bleeding pseudoaneurysm of left brachiocephalic arteriovenous fistula ([place]) 12/24/2014  . Pure hypercholesterolemia 11/09/2014  . Obesity 11/07/2014  . Healthcare maintenance 11/07/2014  . H/O malignant neoplasm of thyroid 11/07/2014  . Toe amputation status 10/13/2014  . ICD (implantable cardioverter-defibrillator) in place 07/25/2014  . ESRD (end stage renal disease) on hemodialysis 07/25/2014  . End-stage renal disease ([place]) 07/25/2014  . Automatic implantable cardioverter-defibrillator in situ 07/25/2014  . Pulmonary hypertension ([place]) 11-29-202016  . Secondary pulmonary hypertension 11-29-202016  . Atrial fibrillation [I48.91] 05/24/2014  . Long term current use of anticoagulant therapy 05/24/2014    Past Medical History: Past Medical History:  Diagnosis Date  . Abscess 02/2018   LEFT THIGH   . AICD (automatic cardioverter/defibrillator) present    [employer] - s/p extraction 08/21/17 due to staph infection  . Anemia   . Aortic stenosis    moderate AS by 08/2016 echo  . Arthritis    "knees" (08/05/2016)  . Atrial fibrillation ([place])   . ESRD (end stage renal disease) on dialysis [facility])    ESRD due to HTN started HD 2003, had renal Tx 2009 for 18 mos. Was on NX stage hemodialysis as of 2018 but in late 2019 is back at [facility] incenter HD.   Marland Kitchen Great toe amputation status    status post left hallux amputation 08/01/14  . History of blood transfusion 2009   "S/P biopsy for prostate cancer check"  . Hypothyroidism (acquired)   . Mitral stenosis    moderate mitral stenosis  . Nonhealing surgical wound    left thigh  . Nonischemic cardiomyopathy ([place])   .  Pneumonia 2014; 08/05/2016  . Pulmonary HTN ([place])   . Renal insufficiency   . Thyroid cancer ([place]) 2011    Past Surgical History: Past Surgical History:  Procedure Laterality Date  . A/V SHUNTOGRAM N/A 02/25/2018   Procedure: A/V SHUNTOGRAM;  Surgeon: Marty Heck, MD;  Location: [place] CV [facility];  Service: Cardiovascular;  Laterality: N/A;  lt arm fistula  . ARTERIAL LINE INSERTION Right 08/24/2017   Procedure: ARTERIAL LINE INSERTION INTO RIGHT FEMORAL ARTERY;  Surgeon: Conrad Carlton, MD;  Location: [place];  Service: Vascular;  Laterality: Right;  . BASCILIC VEIN TRANSPOSITION Right 10/13/2017   Procedure: FIRST STAGE BRACHIAL VEIN TRANSPOSITION RIGHT ARM;  Surgeon: Conrad [place], MD;  Location: [place];  Service: Vascular;  Laterality: Right;  .  BIOPSY  09/25/2017   Procedure: BIOPSY;  Surgeon: Ronnette Juniper, MD;  Location: [facility] ENDOSCOPY;  Service: Gastroenterology;;  . CARDIAC CATHETERIZATION Right 2017  . CARDIAC DEFIBRILLATOR PLACEMENT  2009  . DIALYSIS FISTULA CREATION Left 2003   "upper arm"  . DIALYSIS/PERMA CATHETER REMOVAL Left 09/01/2017   Procedure: DIALYSIS/PERMA CATHETER REMOVAL;  Surgeon: Conrad Wilson, MD;  Location: [place];  Service: Vascular;  Laterality: Left;  . ESOPHAGOGASTRODUODENOSCOPY (EGD) WITH PROPOFOL N/A 09/25/2017   Procedure: ESOPHAGOGASTRODUODENOSCOPY (EGD) WITH PROPOFOL;  Surgeon: Ronnette Juniper, MD;  Location: [place];  Service: Gastroenterology;  Laterality: N/A;  . HERNIA REPAIR    . I&D EXTREMITY Right 11/06/2017   Procedure: IRRIGATION AND DEBRIDEMENT KNEE;  Surgeon: Rod Can, MD;  Location: [place];  Service: Orthopedics;  Laterality: Right;  . I&D EXTREMITY Left 01/03/2018   Procedure: IRRIGATION AND DEBRIDEMENT OF THIGH, wound vac application  wound size 10cm x 2.5cm x 2.5cm;  Surgeon: Waynetta Sandy, MD;  Location: [place];  Service: Vascular;  Laterality: Left;  . ICD LEAD REMOVAL Right 08/20/2017   Procedure: ICD EXTRACTION WITH  C-ARM;  Surgeon: Evans Lance, MD;  Location: [facility] OR;  Service: Cardiovascular;  Laterality: Right;  DR VAN TRIGT TO BACK-UP  . icd removed    . INSERTION OF DIALYSIS CATHETER N/A 11/11/2016   Procedure: INSERTION OF TUNNELED DIALYSIS CATHETER;  Surgeon: Angelia Mould, MD;  Location: [place];  Service: Vascular;  Laterality: N/A;  . INSERTION OF DIALYSIS CATHETER Left 08/24/2017   Procedure: INSERTION OF DIALYSIS CATHETER;  Surgeon: Conrad [place], MD;  Location: [place];  Service: Vascular;  Laterality: Left;  . INSERTION OF DIALYSIS CATHETER Left 09/01/2017   Procedure: INSERTION OF DIALYSIS CATHETER;  Surgeon: Conrad [place], MD;  Location: [place];  Service: Vascular;  Laterality: Left;  . KIDNEY TRANSPLANT  2009  . KNEE SURGERY Bilateral 2000s   "drained fluid"  . LAPAROSCOPIC GASTRIC BANDING  2006  . LIGATION OF ARTERIOVENOUS  FISTULA Right 11/10/2017   Procedure: LIGATION OF BRACHIAL VEIN TRANSPOSITION RIGHT ARM;  Surgeon: Angelia Mould, MD;  Location: [place];  Service: Vascular;  Laterality: Right;  . MASS EXCISION N/A 01/12/2018   Procedure: EXCISION OF MULTIPLE SUBCUTANEOUS MASSES;  Surgeon: Ralene Ok, MD;  Location: [place];  Service: General;  Laterality: N/A;  . PROSTATE BIOPSY  2009  . REVISON OF ARTERIOVENOUS FISTULA Left 1/61/0960   Procedure: PLICATION OF ARTERIOVENOUS FISTULA ANEURYSM;  Surgeon: Elam Dutch, MD;  Location: [place];  Service: Vascular;  Laterality: Left;  . REVISON OF ARTERIOVENOUS FISTULA Left 11/10/2016   Procedure: REVISON OF ARTERIOVENOUS FISTULA - LEFT UPPER ARM;  Surgeon: Elam Dutch, MD;  Location: [place];  Service: Vascular;  Laterality: Left;  . REVISON OF ARTERIOVENOUS FISTULA Left 08/24/2017   Procedure: REVISON OF ARTERIOVENOUS FISTULA LEFT;  Surgeon: Conrad , MD;  Location: [place];  Service: Vascular;  Laterality: Left;  . TEE WITHOUT CARDIOVERSION N/A 08/20/2017   Procedure: TRANSESOPHAGEAL ECHOCARDIOGRAM (TEE);  Surgeon:  Evans Lance, MD;  Location: [place];  Service: Cardiovascular;  Laterality: N/A;  . THROMBECTOMY W/ EMBOLECTOMY Left 12/25/2014   Procedure: THROMBECTOMY ARTERIOVENOUS FISTULA;  Surgeon: Elam Dutch, MD;  Location: [place];  Service: Vascular;  Laterality: Left;  . THYROIDECTOMY  2011  . TOE AMPUTATION Bilateral     right 1st and  2nd digits; left 1st and 3rd digits"  . UMBILICAL HERNIA REPAIR  X 2  .  UPPER EXTREMITY VENOGRAPHY Right 11/05/2017   Procedure: UPPER EXTREMITY VENOGRAPHY;  Surgeon: Conrad [place], MD;  Location: [place] CV LAB;  Service: Cardiovascular;  Laterality: Right;  Marland Kitchen VASCULAR ACCESS DEVICE INSERTION Left 11/10/2017   Procedure: INSERTION OF SUPER HERO VASCULAR ACCESS DEVICE LEFT ARM;  Surgeon: Angelia Mould, MD;  Location: [place];  Service: Vascular;  Laterality: Left;  Marland Kitchen VEIN HARVEST Left 08/24/2017   Procedure: VEIN HARVEST;  Surgeon: Conrad Chisholm, MD;  Location: [place];  Service: Vascular;  Laterality: Left;  . WOUND DEBRIDEMENT Left 12/22/2017   Procedure: EXCISION OF LEFT THIGH WOUND;  Surgeon: Elam Dutch, MD;  Location: [place];  Service: Vascular;  Laterality: Left;  . WOUND DEBRIDEMENT Left 01/28/2018   Procedure: DEBRIDEMENT WOUND LEFT THIGH;  Surgeon: Marty Heck, MD;  Location: [place];  Service: Vascular;  Laterality: Left;  . WOUND DEBRIDEMENT Right 02/23/2018   Procedure: DEBRIDEMENT AND IRRIGATION OF RIGHT FLANK WOUND;  Surgeon: Ralene Ok, MD;  Location: [place];  Service: General;  Laterality: Right;   Social History: Social History   Tobacco Use  . Smoking status: Never Smoker  . Smokeless tobacco: Never Used  Substance Use Topics  . Alcohol use: Yes    Alcohol/week: 0.0 standard drinks    Comment: social  . Drug use: No    Please also refer to relevant sections of EMR.  Family History: Family History  Problem Relation Age of Onset  . Heart failure Mother   . Hypertension Mother   . CAD Mother 71  . Emphysema  Mother   . Hypertension Father   . Kidney failure Father     Allergies and Medications: Allergies  Allergen Reactions  . Enalapril Swelling and Other (See Comments)    MOUTH SWELLING EDEMA  . Iodinated Diagnostic Agents Nausea And Vomiting and Nausea Only   No current facility-administered medications on file prior to encounter.    Current Outpatient Medications on File Prior to Encounter  Medication Sig Dispense Refill  . albuterol (PROVENTIL) (2.5 MG/3ML) 0.083% nebulizer solution Take 3 mLs (2.5 mg total) by nebulization every 6 (six) hours as needed for wheezing or shortness of breath. 360 mL 5  . ambrisentan (LETAIRIS) 10 MG tablet Take 1 tablet (10 mg total) by mouth daily. 30 tablet 11  . amiodarone (PACERONE) 200 MG tablet Take 200 mg by mouth daily.     Marland Kitchen apixaban (ELIQUIS) 2.5 MG TABS tablet Take 1 tablet (2.5 mg total) by mouth 2 (two) times daily. 60 tablet 0  . atorvastatin (LIPITOR) 40 MG tablet Take 1 tablet (40 mg total) by mouth every evening. 30 tablet 2  . cinacalcet (SENSIPAR) 90 MG tablet Take 90 mg by mouth every evening.    Marland Kitchen levothyroxine (SYNTHROID, LEVOTHROID) 300 MCG tablet Take 1 tablet (300 mcg total) by mouth daily before breakfast. 90 tablet 3  . midodrine (PROAMATINE) 10 MG tablet TAKE 1 TABLET (10 MG TOTAL) BY MOUTH 3 (THREE) TIMES DAILY. 90 tablet 5  . multivitamin (RENA-VIT) TABS tablet TAKE 1 TABLET BY MOUTH EVERYDAY AT BEDTIME (Patient taking differently: Take 1 tablet by mouth at bedtime. ) 30 tablet 0  . nystatin (MYCOSTATIN/NYSTOP) powder Apply topically 4 (four) times daily. (Patient taking differently: Apply 1 g topically daily. ) 60 g 0  . pantoprazole (PROTONIX) 40 MG tablet Take 1 tablet (40 mg total) by mouth 2 (two) times daily. 30 tablet 1  . tadalafil (ADCIRCA/CIALIS) 20 MG tablet Take 20 mg  by mouth daily.     . traMADol (ULTRAM) 50 MG tablet Take 1 tablet (50 mg total) by mouth every 6 (six) hours as needed. 28 tablet 0    Objective: BP (!) 82/58 (BP Location: Right Arm)   Pulse 71   Temp 98.7 F (37.1 C)   Resp 14   SpO2 98%   Physical Exam  Constitutional: He appears well-developed and well-nourished. No distress.  Eyes: EOM are normal.  Neck: Normal range of motion.  Cardiovascular: Normal rate, regular rhythm and intact distal pulses.  Murmur (Grade 3/6 systolic) heard. Rate in the 80s  Pulmonary/Chest: Effort normal and breath sounds normal. No stridor. No respiratory distress. He has no wheezes.  Abdominal: Soft. Bowel sounds are normal. He exhibits distension (Left-sided, soft).  Musculoskeletal: Normal range of motion. He exhibits edema (3+ pitting in LLE, 2+ pitting in RLE) and deformity (Toes 1 - 3 bilaterally s/p amputation).  Lymphadenopathy:    He has no cervical adenopathy.  Skin: Skin is warm and dry. No rash noted.  Wound to left anterior thigh clean with dressing in place   Labs and Imaging: CBC BMET  Recent Labs  Lab 03/25/18 2021  WBC 6.2  HGB 7.8*  HCT 27.0*  PLT 112*   Recent Labs  Lab 03/25/18 2021  NA 134*  K 3.8  CL 93*  CO2 28  BUN 25*  CREATININE 5.36*  GLUCOSE 61*  CALCIUM 8.8*     Dg Chest 2 View  Result Date: 03/25/2018 CLINICAL DATA:  Shortness of breath and chest pain EXAM: CHEST - 2 VIEW COMPARISON:  02/18/2018 FINDINGS: Hero graft is again identified on the left. Prior venous stenting is noted as well. The lungs are clear bilaterally. The cardiac shadow is again enlarged but stable. Aortic calcifications are again seen. Mild vascular congestion is again noted. No bony abnormality is seen. IMPRESSION: Mild vascular congestion consistent with mild fluid overload related to end-stage renal disease. No new focal abnormality is seen. Electronically Signed   By: Inez Catalina M.D.   On: 03/25/2018 21:26   Daisy Floro, DO 03/25/2018, 10:11 PM PGY-1, [place] [facility] pager: [phone], text pages welcome  I have seen and  evaluated the above patient with Dr. Ouida Sills and agree with her documentation.  I am including my edits in blue.   Lovenia Kim MD [place] PGY3

## 2018-03-25 NOTE — ED Notes (Signed)
Report called to floor. Advised of pressure in the 70s. Admitting team aware of BP.

## 2018-03-25 NOTE — ED Triage Notes (Signed)
Pt reports ongoing left leg swelling. Reports starting today it has now increased to his left abd and causing sob and cramping pain.

## 2018-03-26 DIAGNOSIS — J810 Acute pulmonary edema: Secondary | ICD-10-CM

## 2018-03-26 DIAGNOSIS — E1143 Type 2 diabetes mellitus with diabetic autonomic (poly)neuropathy: Secondary | ICD-10-CM

## 2018-03-26 DIAGNOSIS — I5023 Acute on chronic systolic (congestive) heart failure: Secondary | ICD-10-CM

## 2018-03-26 DIAGNOSIS — T829XXA Unspecified complication of cardiac and vascular prosthetic device, implant and graft, initial encounter: Secondary | ICD-10-CM

## 2018-03-26 LAB — BASIC METABOLIC PANEL
Anion gap: 15 (ref 5–15)
BUN: 27 mg/dL — AB (ref 6–20)
CALCIUM: 8.6 mg/dL — AB (ref 8.9–10.3)
CO2: 26 mmol/L (ref 22–32)
CREATININE: 5.97 mg/dL — AB (ref 0.61–1.24)
Chloride: 94 mmol/L — ABNORMAL LOW (ref 98–111)
GFR calc non Af Amer: 10 mL/min — ABNORMAL LOW (ref >60)
GFR, EST AFRICAN AMERICAN: 11 mL/min — AB (ref >60)
Glucose, Bld: 56 mg/dL — ABNORMAL LOW (ref 70–99)
Potassium: 3.7 mmol/L (ref 3.5–5.1)
SODIUM: 135 mmol/L (ref 135–145)

## 2018-03-26 LAB — CBC
HEMATOCRIT: 27.7 % — AB (ref 39.0–52.0)
Hemoglobin: 8.1 g/dL — ABNORMAL LOW (ref 13.0–17.0)
MCH: 26.6 pg (ref 26.0–34.0)
MCHC: 29.2 g/dL — AB (ref 30.0–36.0)
MCV: 90.8 fL (ref 80.0–100.0)
PLATELETS: 103 10*3/uL — AB (ref 150–400)
RBC: 3.05 MIL/uL — ABNORMAL LOW (ref 4.22–5.81)
RDW: 20.8 % — AB (ref 11.5–15.5)
WBC: 5.1 10*3/uL (ref 4.0–10.5)
nRBC: 0 % (ref 0.0–0.2)

## 2018-03-26 MED ORDER — DAKINS (1/4 STRENGTH) 0.125 % EX SOLN
Freq: Two times a day (BID) | CUTANEOUS | Status: AC
  Administered 2018-03-26 – 2018-03-28 (×3)
  Filled 2018-03-26: qty 473

## 2018-03-26 MED ORDER — JUVEN PO PACK
1.0000 | PACK | Freq: Two times a day (BID) | ORAL | Status: DC
  Administered 2018-03-26 – 2018-03-29 (×4): 1 via ORAL
  Filled 2018-03-26 (×7): qty 1

## 2018-03-26 MED ORDER — NEPRO/CARBSTEADY PO LIQD
237.0000 mL | Freq: Two times a day (BID) | ORAL | Status: DC
  Administered 2018-03-26 – 2018-03-28 (×3): 237 mL via ORAL
  Filled 2018-03-26 (×7): qty 237

## 2018-03-26 MED ORDER — SODIUM CHLORIDE 0.9 % IV SOLN: 100.0000 mL | INTRAVENOUS | Status: DC | PRN

## 2018-03-26 MED ORDER — PRO-STAT SUGAR FREE PO LIQD
30.0000 mL | Freq: Three times a day (TID) | ORAL | Status: DC
  Administered 2018-03-26 – 2018-03-28 (×7): 30 mL via ORAL
  Filled 2018-03-26 (×8): qty 30

## 2018-03-26 MED ORDER — VITAMIN C 500 MG PO TABS
250.0000 mg | ORAL_TABLET | Freq: Two times a day (BID) | ORAL | Status: DC
  Administered 2018-03-26 – 2018-03-29 (×6): 250 mg via ORAL
  Filled 2018-03-26 (×6): qty 1

## 2018-03-26 MED ORDER — CHLORHEXIDINE GLUCONATE CLOTH 2 % EX PADS: 6.0000 | MEDICATED_PAD | Freq: Every day | CUTANEOUS | Status: DC

## 2018-03-26 NOTE — Discharge Summary (Signed)
[facility] Discharge Summary  Patient name: Adam [name]. Medical record number: [id number] Date of birth: 1961-02-05 Age: 57 y.o. Gender: male Date of Admission: 03/25/2018  Date of Discharge: 03/29/18 Admitting Physician: Zenia Resides, MD  Primary Care Provider: [name], MD Consultants: nephrology, cardiology  Indication for Hospitalization: fluid overload  Discharge Diagnoses/Problem List:  HFrEF exacerbation ESRD Hypotension Acute on chronic anemia L thigh ulceration A fib Pulmonary HTN Hypothyroidism GERD   Disposition: discharge home  Discharge Condition: stable  Discharge Exam:  Constitutional: He is oriented to person, place, and time. He appears well-developed and well-nourished. No distress.  Pleasant  Eyes: EOM are normal.  Cardiovascular: Intact distal pulses.  Irregularly irregular rhythm, rate controlled around 80, grade 3/6 systolic murmur auscultated, stable  Pulmonary/Chest: Effort normal.  Coarse breath sounds with minimal crackles in bilateral lower lobes, improved.  Abdominal: Soft.  Musculoskeletal: He exhibits edema (3+ pitting left from ankle up to thigh; 2+ nonpitting R lower extremity).  Neurological: He is alert and oriented to person, place, and time.  Skin: Skin is warm and dry.  Wound to anterior left thigh clean with dressing in place.  Psychiatric: He has a normal mood and affect.    Brief Hospital Course:  Presented with abnormal swelling and severe DOE thought to be likely due to volume overload from changing dry weight at home dialysis and/or heart failure. Patient received dialysis while inpatient and a total of 6.7 L were removed during his stay, causing him to lose 8 kg of fluid. Repeat echo showed reduced EF to 30-35% with severe global hypokinesis, severe RV enlargement, severe TR, severe RAE.  Patient's chronic anemia was also monitored and got as low as 6.9, so 2 units of packed  red blood cells were transfused.  Cardiology was consulted given patient's newly worsened EF and severe pulmonary hypertension, and they recommended no changes to his medication regimen given his chronic hypotension, precluding further afterload reduction.  Palliative care was consulted given patient's high medical burden.  Patient was amenable to palliative care discussions, so an outpatient referral to palliative care was sent as well.  With his fluid status improved and stable, patient was medically cleared for discharge on 03/29/2018.  Issues for Follow Up:  1. Please ensure that out patient palliative care continues to follow with patient due to his significant medical issues 2. Follow patient's fluid status closely, since this has been an issue for him multiple times in the past 3. Monitor patient's left thigh wound for any signs of infection.  No infection was noted during his hospitalization  Significant Procedures: dialysis, echo   Significant Labs and Imaging:  Recent Labs  Lab 03/27/18 1053 03/28/18 0454 03/28/18 2156 03/29/18 0530  WBC 6.8 5.7  --  6.0  HGB 7.5* 7.1* 8.2* 8.6*  HCT 24.8* 23.5* 27.8* 28.5*  PLT 112* 113*  --  105*   Recent Labs  Lab 03/25/18 2021 03/26/18 0556 03/27/18 0522 03/28/18 0454 03/29/18 0530  NA 134* 135 135 136 133*  K 3.8 3.7 3.9 3.4* 3.7  CL 93* 94* 96* 96* 95*  CO2 28 26 27 29 25   GLUCOSE 61* 56* 56* 63* 67*  BUN 25* 27* 20 13 22*  CREATININE 5.36* 5.97* 4.37* 3.66* 4.79*  CALCIUM 8.8* 8.6* 8.2* 7.9* 7.5*  PHOS  --   --  2.3*  --  2.3*  ALBUMIN  --   --  2.2*  --  2.4*  Results/Tests Pending at Time of Discharge: none  Discharge Medications:  Allergies as of 03/29/2018      Reactions   Enalapril Swelling, Other (See Comments)   MOUTH SWELLING EDEMA   Iodinated Diagnostic Agents Nausea And Vomiting, Nausea Only      Medication List    TAKE these medications   albuterol (2.5 MG/3ML) 0.083% nebulizer solution Commonly  known as:  PROVENTIL Take 3 mLs (2.5 mg total) by nebulization every 6 (six) hours as needed for wheezing or shortness of breath.   ambrisentan 10 MG tablet Commonly known as:  LETAIRIS Take 1 tablet (10 mg total) by mouth daily.   amiodarone 200 MG tablet Commonly known as:  PACERONE Take 200 mg by mouth daily.   apixaban 2.5 MG Tabs tablet Commonly known as:  ELIQUIS Take 1 tablet (2.5 mg total) by mouth 2 (two) times daily.   atorvastatin 40 MG tablet Commonly known as:  LIPITOR Take 1 tablet (40 mg total) by mouth every evening.   cinacalcet 90 MG tablet Commonly known as:  SENSIPAR Take 90 mg by mouth every evening.   levothyroxine 300 MCG tablet Commonly known as:  SYNTHROID, LEVOTHROID Take 1 tablet (300 mcg total) by mouth daily before breakfast.   midodrine 10 MG tablet Commonly known as:  PROAMATINE TAKE 1 TABLET (10 MG TOTAL) BY MOUTH 3 (THREE) TIMES DAILY.   multivitamin Tabs tablet TAKE 1 TABLET BY MOUTH EVERYDAY AT BEDTIME What changed:  See the new instructions.   nystatin powder Commonly known as:  MYCOSTATIN/NYSTOP Apply topically 4 (four) times daily. What changed:    how much to take  when to take this   pantoprazole 40 MG tablet Commonly known as:  PROTONIX Take 1 tablet (40 mg total) by mouth 2 (two) times daily.   tadalafil 20 MG tablet Commonly known as:  ADCIRCA/CIALIS Take 20 mg by mouth daily.   traMADol 50 MG tablet Commonly known as:  ULTRAM Take 1 tablet (50 mg total) by mouth every 6 (six) hours as needed.       Discharge Instructions: Please refer to Patient Instructions section of EMR for full details.  Patient was counseled important signs and symptoms that should prompt return to medical care, changes in medications, dietary instructions, activity restrictions, and follow up appointments.   Follow-Up Appointments: Follow-up Information    [facility] Follow up.   Specialty:  Home Health  Services Why:  Registered Nurse Contact information: [address] [place] [address] [phone]           Kathrene Alu, MD 1September 12, 202019, 6:11 PM PGY-2, [place]

## 2018-03-26 NOTE — Consult Note (Signed)
[facility] Nurse wound consult note. Patient seen at [facility]. Spouse present throughout examination.  Reason for Consult: Left thigh wound Wound type: Left thigh wound is an old surgical wound that the patient reports Dr. Carlis Abbott started him on Dakin's solution twice daily on Tuesday of this week and this therapy was to last 2 weeks.  While in the room patient informed us he has 2 additional wounds where he had cysts removed and has been treating those areas with saline moistened gauze twice daily.  Measurements: Left upper anterior thigh 5 cm x 8 cm x unknown depth. 75% brown and yellow slough and 25% pink. No odor no drainage and no induration. Surrounding tissue is darkened.  Left axilla cyst wound measures: 1.2cm x 2cm x 1.3 cm wound base is pink. No odor, no induration, very little drainage on existing gauze dressing.  Right flank cyst wound measures: 1cm x 3.5cm x 5cm. As best I could tell the wound base is pink. Surrounding tissue is darkened.  Plan of Care for areas as outlined above.  Monitor the wound area(s) for worsening of condition such as: Signs/symptoms of infection,  Increase in size,  Development of or worsening of odor, Development of pain, or increased pain at the affected locations.  Notify the medical team if any of these develop.  Thank you for the consult.  Discussed plan of care with the patient and bedside nurse.  [facility] nurse will not follow at this time.  Please re-consult the [facility] team if needed.  Val Riles, RN, MSN, CWOCN, CNS-BC, pager [phone]

## 2018-03-26 NOTE — Progress Notes (Signed)
Initial Nutrition Assessment  DOCUMENTATION CODES:   Not applicable  INTERVENTION:   Nepro Shake po BID, each supplement provides 425 kcal and 19 grams protein  Prostat liquid protein PO 30 ml TID with meals, each supplement provides 100 kcal, 15 grams protein.  Rena-vite daily   Vitamin C 231m po BID  Juven Fruit Punch BID, each serving provides 95kcal and 2.5g of protein (amino acids glutamine and arginine)  Liberalize diet   NUTRITION DIAGNOSIS:   Increased nutrient needs related to chronic illness(ESRD on HD, wound healing ) as evidenced by increased estimated needs.  GOAL:   Patient will meet greater than or equal to 90% of their needs  MONITOR:   PO intake, Supplement acceptance, Labs, Weight trends, Skin, I & O's  REASON FOR ASSESSMENT:   Malnutrition Screening Tool    ASSESSMENT:    57y.o. male presenting with lower extremity and abdominal swelling with shortness of breath. PMH is significant for ESRD on HD MTRF,open wound of left thigh, anemia of chronic disease, HLD, obesity s/p bariatric surgery, hypotension, insomnia and atrial fib.   Pt with L AVF   Met with pt in room today. Pt reports poor appetite and oral intake for the past 2-3 months. Pt reports his appetite is improving; pt ate 75% of his lunch today. Pt reports taking a daily rena-vite at home. Pt also drinks Glucerna and eats protein bars. Pt has drank Prostat in the past and is ok with taking this while in hospital. Per chart, pt is weight stable pta; pt reports his dry weight is ~119kg. It is difficult to determine if pt has had any true weight loss r/t edema. Pt with h/o gastric band in 2006 but reports his port got infected and the band had to be removed about 9 years after having the surgery. Pt's maximum weight was around 425lbs. RD will order vitamins and supplements to encourage wound healing and replace losses from HD. RD will also liberalize pt's diet as a renal diet restricts protein also.    Medications reviewed and include: cinacalcet, synthroid, rena-vite, protonix, tramadol   Labs reviewed: K 3.7 wnl, Cl 94(L), BUN 56(H), creat 5.97(H) Hgb 8.1(L), Hct 27.7(L) iPTH 124(H)- 6/26  NUTRITION - FOCUSED PHYSICAL EXAM:    Most Recent Value  Orbital Region  No depletion  Upper Arm Region  Moderate depletion  Thoracic and Lumbar Region  Mild depletion  Buccal Region  No depletion  Temple Region  No depletion  Clavicle Bone Region  No depletion  Clavicle and Acromion Bone Region  No depletion  Scapular Bone Region  No depletion  Dorsal Hand  No depletion  Patellar Region  Mild depletion  Anterior Thigh Region  No depletion  Posterior Calf Region  No depletion  Edema (RD Assessment)  Severe  Hair  Reviewed  Eyes  Reviewed  Mouth  Reviewed  Skin  Reviewed  Nails  Reviewed     Diet Order:   Diet Order            Diet renal with fluid restriction Fluid restriction: 1200 mL Fluid; Room service appropriate? Yes; Fluid consistency: Thin  Diet effective now             EDUCATION NEEDS:   Education needs have been addressed  Skin:  Skin Assessment: Reviewed RN Assessment(Left upper anterior thigh 5 cm x 8 cm, Left axilla cyst wound measures: 1.2cm x 2cm x 1.3 cm, Right flank cyst wound measures: 1cm x 3.5cm x 5cm)  Last BM:  11/14  Height:   Ht Readings from Last 1 Encounters:  03/25/18 _0  (1.854 m)    Weight:   Wt Readings from Last 1 Encounters:  03/25/18 118.6 kg    Ideal Body Weight:  83.6 kg  BMI:  Body mass index is 34.5 kg/m.  Estimated Nutritional Needs:   Kcal:  2400-2700kcal/day   Protein:  >142g/day   Fluid:  1549m/day   CKoleen DistanceMS, RD, LDN Pager #- [phone]#- [phone] Hours Pager: [phone]

## 2018-03-26 NOTE — Procedures (Signed)
Patient tolerated treatment this evening.  Removed net UF 3250.  Post weight 116.3 on stand up scale reflects wt change 3.9 KG.  BP remained steady throughout trmt.  Patient with no orthostatic changes post trmt.

## 2018-03-26 NOTE — Progress Notes (Signed)
Family Medicine Teaching Service Daily Progress Note Intern Pager: [phone]  Patient name: Adam [name]. Medical record number: [id number] Date of birth: Mar 09, 1961 Age: 57 y.o. Gender: male  Primary Care Provider: [name], MD Consultants: nephrology Code Status: full  Pt Overview and Major Events to Date:  11/14 admitted  Assessment and Plan: Saylor Sheckler. is a 57 y.o. male presenting with lower extremity and abdominal swelling with shortness of breath. PMH is significant for ESRD on HD MTRF,open wound of left thigh, anemia of chronic disease, HLD, obesity s/p bariatric surgery, hypotension, insomnia and atrial fib.  Lower extremity and abdominal swelling- patient states his swelling is slightly improved from yesterday. He had swelling in this area of his abdomen several years ago and required paracentesis. I did not appreciate any free fluid on my exam and it appeared in the tissues but could consider [place] or CT to detect any drainable fluid collection. I suspect this is related to exacerbation of HFrEF. Patient's dialysis schedule was interrupted this week and he is due for dialysis today- consulted nephrology. Patient states his dry weight is 119kg and he is 118.7 on admission. Will reevaluate fluid status after dialysis. He did not appear to have fluid on lungs by my exam but was present on cxray. 2+ pitting LE edema bilaterally- worse on left. -Nephro consulted in the ED, Dr. Justin Mend to see him 11/15 - appreciate recs -Tylenol PRN mild pain, tramadol 50 mg every 6 as needed moderate pain - albuterol nebulizer q6 PRN wheezing/shortness of breath  - Monitor fluid status -Continuous cardiac and pulse ox monitoring -Strict I's and O's -Daily weights -vitals per floor protocol  ESRD on HD- The patient has a hero graft of his left upper extremity placed approximately 2 months prior- scheduled for dialysis today -f/u nephro recs - dialysis schedule Monday. Tuesday,  Thursday, Friday -Renal diet with fluid restriction 1200 cc  HFrEF: Echo 02/19/2018 EF 30-35% with G1DD and diffuse hypokinesis. Before then echo from April 2019 was EF 60-65% with G1DD. I suspect his swelling is 2/2 exacerbation of HFrEF 2/2 strain from HD schedule interruption. -Continue to monitor -Hemodialysis on 11/15   Open wound of left thigh- He follows with Dr[facility] Carlis [facility] gets Dakin's outpatient. Wound has some slough and mild exudation- the wound looks non infectious. Wound care is following- appreciate their great care and attention -f/u Wound care recs  Hypotension: Blood pressures 17-616 systolic, diastolic 07-371. The low BP is a very normal blood pressure range for this patient.  Patient takes midodrine at home. -Continue midodrine 10 mg 3 times daily with meals  -Vitals per protocol   A. fib: Patient has a history of such, not in A. Fib during exam.  Heart rate in the 60s-70s. Patient takes amiodarone 200 mg daily and Eliquis 2.5 mg twice daily. -Cardiac monitoring -Continue amiodarone and Eliquis  H/o Pulmonary hypertension- Takes ambrisentan 10 mg daily.  -Continue ambrisentan  Hypothyroidism s/p thyroidectomy 2/2 thyroid cancer of unknown kind- Home meds: 300 mcg levothyroxine daily.  Most recent TSH in October 2019 is 3.781. -Continue levothyroxine daily   GERD- home meds: Nexium. -Protonix PRN  FEN/GI: Saline lock, renal diet with fluid restriction Prophylaxis: Home Eliquis 2.5 mg daily   Disposition: med-surg  Subjective:  Overnight: admitted  Today: patient feels ok. Denies dyspnea at rest. Endorses DOE severe  Objective: Temp:  [97.4 F (36.3 C)-98.7 F (37.1 C)] 97.5 F (36.4 C) (11/15 0457)  Pulse Rate:  [67-75] 67 (11/15 0457) Resp:  [12-20] 16 (11/15 0457) BP: (72-152)/(51-121) 80/54 (11/15 0457) SpO2:  [90 %-100 %] 100 % (11/15 0457) Weight:  [118.6 kg] 118.6 kg (11/14 2328) Physical  Exam: General: well-appearing. Morbidly obese Cardiovascular: RRR, no murmur appreciated Respiratory: CTAB, no wheezing or crackles Abdomen: obese, excess skin. Indurated tissue on left lower panus. Non erythematous or warmer Extremities: 2+ pitting edema bilateral LE- worse on left. Healing surgical lesion on left anterior thigh  Laboratory: Recent Labs  Lab 03/25/18 2021 03/26/18 0556  WBC 6.2 5.1  HGB 7.8* 8.1*  HCT 27.0* 27.7*  PLT 112* 103*   Recent Labs  Lab 03/25/18 2021  NA 134*  K 3.8  CL 93*  CO2 28  BUN 25*  CREATININE 5.36*  CALCIUM 8.8*  GLUCOSE 61*   Troponin 0.01  Imaging/Diagnostic Tests: Dg Chest 2 View  Result Date: 03/25/2018 CLINICAL DATA:  Shortness of breath and chest pain EXAM: CHEST - 2 VIEW COMPARISON:  02/18/2018 FINDINGS: Hero graft is again identified on the left. Prior venous stenting is noted as well. The lungs are clear bilaterally. The cardiac shadow is again enlarged but stable. Aortic calcifications are again seen. Mild vascular congestion is again noted. No bony abnormality is seen. IMPRESSION: Mild vascular congestion consistent with mild fluid overload related to end-stage renal disease. No new focal abnormality is seen. Electronically Signed   By: Inez Catalina M.D.   On: 03/25/2018 21:26    Richarda Osmond, DO 03/26/2018, 7:01 AM PGY-1, [facility] Intern pager: [phone], text pages welcome

## 2018-03-26 NOTE — Progress Notes (Addendum)
Pt. arrived on unit. Alert and oriented. Placed on tele and continuous pulse ox. BP running in the 80s. MD is aware. Dressing change performed on open wounds of R buttocks and L lower lateral breast. ED nurse performed dressing change for wound on L thigh.

## 2018-03-26 NOTE — Consult Note (Addendum)
[facility] Renal Consultation Note    Indication for Consultation:  Management of ESRD/hemodialysis; anemia, hypertension/volume and secondary hyperparathyroidism PCP:  HPI: Adam [name]. is a 57 y.o. male with ESRD on HD, HTN, failed renal Tx, NICM EF 30-35%, AICD, Atrial fib, pulm HTN, chronic hypotension on midodrine. Currently on home HD MTTF. Using LUE HeRO AVG.   He is admitted with worsening lower extremity and abdominal swelling. Swelling began 1 day prior. He notices that his worse on his left side. He also endorses increased DOE and some cough when he lies flat. Admits that appetite has not been great and is losing weight.   He did have a change in his dialysis schedule this week. They were unable to dialyze Tuesday and Wednesday's dialysis was complicated by access alarms. Though he says he did reach his dry weight which is currently 119kg. Hospital weight today is 118.6kg.   CXR showed mild vascular congestion. Labs: Na 137, K 3.7, BUN 56 Cr 5.97, Hgb 8.1. Breathing comfortably on RA.   Asked to see for dialysis today.   Past Medical History:  Diagnosis Date  . Abscess 02/2018   LEFT THIGH   . AICD (automatic cardioverter/defibrillator) present    [employer] - s/p extraction 08/21/17 due to staph infection  . Anemia   . Aortic stenosis    moderate AS by 08/2016 echo  . Arthritis    "knees" (08/05/2016)  . Atrial fibrillation ([place])   . ESRD (end stage renal disease) on dialysis [facility])    ESRD due to HTN started HD 2003, had renal Tx 2009 for 18 mos. Was on NX stage hemodialysis as of 2018 but in late 2019 is back at [facility] incenter HD.   Marland Kitchen Great toe amputation status    status post left hallux amputation 08/01/14  . History of blood transfusion 2009   "S/P biopsy for prostate cancer check"  . Hypothyroidism (acquired)   . Mitral stenosis    moderate mitral stenosis  . Nonhealing surgical wound    left thigh  . Nonischemic cardiomyopathy ([place])   .  Pneumonia 2014; 08/05/2016  . Pulmonary HTN ([place])   . Renal insufficiency   . Thyroid cancer ([place]) 2011   Past Surgical History:  Procedure Laterality Date  . A/V SHUNTOGRAM N/A 02/25/2018   Procedure: A/V SHUNTOGRAM;  Surgeon: Marty Heck, MD;  Location: [place] CV LAB;  Service: Cardiovascular;  Laterality: N/A;  lt arm fistula  . ARTERIAL LINE INSERTION Right 08/24/2017   Procedure: ARTERIAL LINE INSERTION INTO RIGHT FEMORAL ARTERY;  Surgeon: Conrad [place], MD;  Location: [place];  Service: Vascular;  Laterality: Right;  . BASCILIC VEIN TRANSPOSITION Right 10/13/2017   Procedure: FIRST STAGE BRACHIAL VEIN TRANSPOSITION RIGHT ARM;  Surgeon: Conrad [place] Perrine, MD;  Location: [place];  Service: Vascular;  Laterality: Right;  . BIOPSY  09/25/2017   Procedure: BIOPSY;  Surgeon: Ronnette Juniper, MD;  Location: [facility] ENDOSCOPY;  Service: Gastroenterology;;  . CARDIAC CATHETERIZATION Right 2017  . CARDIAC DEFIBRILLATOR PLACEMENT  2009  . DIALYSIS FISTULA CREATION Left 2003   "upper arm"  . DIALYSIS/PERMA CATHETER REMOVAL Left 09/01/2017   Procedure: DIALYSIS/PERMA CATHETER REMOVAL;  Surgeon: Conrad , MD;  Location: [place];  Service: Vascular;  Laterality: Left;  . ESOPHAGOGASTRODUODENOSCOPY (EGD) WITH PROPOFOL N/A 09/25/2017   Procedure: ESOPHAGOGASTRODUODENOSCOPY (EGD) WITH PROPOFOL;  Surgeon: Ronnette Juniper, MD;  Location: [place];  Service: Gastroenterology;  Laterality: N/A;  . HERNIA REPAIR    .  I&D EXTREMITY Right 11/06/2017   Procedure: IRRIGATION AND DEBRIDEMENT KNEE;  Surgeon: Rod Can, MD;  Location: [place];  Service: Orthopedics;  Laterality: Right;  . I&D EXTREMITY Left 01/03/2018   Procedure: IRRIGATION AND DEBRIDEMENT OF THIGH, wound vac application  wound size 10cm x 2.5cm x 2.5cm;  Surgeon: Waynetta Sandy, MD;  Location: [place];  Service: Vascular;  Laterality: Left;  . ICD LEAD REMOVAL Right 08/20/2017   Procedure: ICD EXTRACTION WITH C-ARM;  Surgeon: Evans Lance, MD;  Location: [facility] OR;  Service: Cardiovascular;  Laterality: Right;  DR VAN TRIGT TO BACK-UP  . icd removed    . INSERTION OF DIALYSIS CATHETER N/A 11/11/2016   Procedure: INSERTION OF TUNNELED DIALYSIS CATHETER;  Surgeon: Angelia Mould, MD;  Location: [place];  Service: Vascular;  Laterality: N/A;  . INSERTION OF DIALYSIS CATHETER Left 08/24/2017   Procedure: INSERTION OF DIALYSIS CATHETER;  Surgeon: Conrad [place], MD;  Location: [place];  Service: Vascular;  Laterality: Left;  . INSERTION OF DIALYSIS CATHETER Left 09/01/2017   Procedure: INSERTION OF DIALYSIS CATHETER;  Surgeon: Conrad Mayes, MD;  Location: [place];  Service: Vascular;  Laterality: Left;  . KIDNEY TRANSPLANT  2009  . KNEE SURGERY Bilateral 2000s   "drained fluid"  . LAPAROSCOPIC GASTRIC BANDING  2006  . LIGATION OF ARTERIOVENOUS  FISTULA Right 11/10/2017   Procedure: LIGATION OF BRACHIAL VEIN TRANSPOSITION RIGHT ARM;  Surgeon: Angelia Mould, MD;  Location: [place];  Service: Vascular;  Laterality: Right;  . MASS EXCISION N/A 01/12/2018   Procedure: EXCISION OF MULTIPLE SUBCUTANEOUS MASSES;  Surgeon: Ralene Ok, MD;  Location: [place];  Service: General;  Laterality: N/A;  . PROSTATE BIOPSY  2009  . REVISON OF ARTERIOVENOUS FISTULA Left 08/18/8117   Procedure: PLICATION OF ARTERIOVENOUS FISTULA ANEURYSM;  Surgeon: Elam Dutch, MD;  Location: [place];  Service: Vascular;  Laterality: Left;  . REVISON OF ARTERIOVENOUS FISTULA Left 11/10/2016   Procedure: REVISON OF ARTERIOVENOUS FISTULA - LEFT UPPER ARM;  Surgeon: Elam Dutch, MD;  Location: [place];  Service: Vascular;  Laterality: Left;  . REVISON OF ARTERIOVENOUS FISTULA Left 08/24/2017   Procedure: REVISON OF ARTERIOVENOUS FISTULA LEFT;  Surgeon: Conrad Veldhuizen, MD;  Location: [place];  Service: Vascular;  Laterality: Left;  . TEE WITHOUT CARDIOVERSION N/A 08/20/2017   Procedure: TRANSESOPHAGEAL ECHOCARDIOGRAM (TEE);  Surgeon: Evans Lance, MD;  Location:  [place];  Service: Cardiovascular;  Laterality: N/A;  . THROMBECTOMY W/ EMBOLECTOMY Left 12/25/2014   Procedure: THROMBECTOMY ARTERIOVENOUS FISTULA;  Surgeon: Elam Dutch, MD;  Location: [place];  Service: Vascular;  Laterality: Left;  . THYROIDECTOMY  2011  . TOE AMPUTATION Bilateral     right 1st and  2nd digits; left 1st and 3rd digits"  . UMBILICAL HERNIA REPAIR  X 2  . UPPER EXTREMITY VENOGRAPHY Right 11/05/2017   Procedure: UPPER EXTREMITY VENOGRAPHY;  Surgeon: Conrad Hudson, MD;  Location: [place] CV LAB;  Service: Cardiovascular;  Laterality: Right;  Marland Kitchen VASCULAR ACCESS DEVICE INSERTION Left 11/10/2017   Procedure: INSERTION OF SUPER HERO VASCULAR ACCESS DEVICE LEFT ARM;  Surgeon: Angelia Mould, MD;  Location: [place];  Service: Vascular;  Laterality: Left;  Marland Kitchen VEIN HARVEST Left 08/24/2017   Procedure: VEIN HARVEST;  Surgeon: Conrad Conyers, MD;  Location: [place];  Service: Vascular;  Laterality: Left;  . WOUND DEBRIDEMENT Left 12/22/2017   Procedure: EXCISION OF LEFT THIGH WOUND;  Surgeon: [name]  E, MD;  Location: [place];  Service: Vascular;  Laterality: Left;  . WOUND DEBRIDEMENT Left 01/28/2018   Procedure: DEBRIDEMENT WOUND LEFT THIGH;  Surgeon: Marty Heck, MD;  Location: [place];  Service: Vascular;  Laterality: Left;  . WOUND DEBRIDEMENT Right 02/23/2018   Procedure: DEBRIDEMENT AND IRRIGATION OF RIGHT FLANK WOUND;  Surgeon: Ralene Ok, MD;  Location: [place];  Service: General;  Laterality: Right;   Family History  Problem Relation Age of Onset  . Heart failure Mother   . Hypertension Mother   . CAD Mother 64  . Emphysema Mother   . Hypertension Father   . Kidney failure Father    Social History:  reports that he has never smoked. He has never used smokeless tobacco. He reports that he drinks alcohol. He reports that he does not use drugs. Allergies  Allergen Reactions  . Enalapril Swelling and Other (See Comments)    MOUTH SWELLING EDEMA  .  Iodinated Diagnostic Agents Nausea And Vomiting and Nausea Only   Prior to Admission medications   Medication Sig Start Date End Date Taking? Authorizing Provider  albuterol (PROVENTIL) (2.5 MG/3ML) 0.083% nebulizer solution Take 3 mLs (2.5 mg total) by nebulization every 6 (six) hours as needed for wheezing or shortness of breath. 03/17/18  Yes Lauraine Rinne, NP  ambrisentan (LETAIRIS) 10 MG tablet Take 1 tablet (10 mg total) by mouth daily. 09/11/17  Yes Juanito Doom, MD  amiodarone (PACERONE) 200 MG tablet Take 200 mg by mouth daily.    Yes [provider]  apixaban (ELIQUIS) 2.5 MG TABS tablet Take 1 tablet (2.5 mg total) by mouth 2 (two) times daily. 01/29/18  Yes Ulyses Amor, PA-C  atorvastatin (LIPITOR) 40 MG tablet Take 1 tablet (40 mg total) by mouth every evening. 12/11/17  Yes Diallo, Abdoulaye, MD  cinacalcet (SENSIPAR) 90 MG tablet Take 90 mg by mouth every evening.   Yes [provider]  levothyroxine (SYNTHROID, LEVOTHROID) 300 MCG tablet Take 1 tablet (300 mcg total) by mouth daily before breakfast. 09/26/17  Yes Enid Derry, [place], DO  midodrine (PROAMATINE) 10 MG tablet TAKE 1 TABLET (10 MG TOTAL) BY MOUTH 3 (THREE) TIMES DAILY. 10/08/17  Yes Minus Breeding, MD  multivitamin (RENA-VIT) TABS tablet TAKE 1 TABLET BY MOUTH EVERYDAY AT BEDTIME Patient taking differently: Take 1 tablet by mouth at bedtime.  01/12/18  Yes Diallo, Abdoulaye, MD  nystatin (MYCOSTATIN/NYSTOP) powder Apply topically 4 (four) times daily. Patient taking differently: Apply 1 g topically daily.  11/18/17  Yes Diallo, Abdoulaye, MD  pantoprazole (PROTONIX) 40 MG tablet Take 1 tablet (40 mg total) by mouth 2 (two) times daily. 02/26/18 02/26/19 Yes Guadalupe Dawn, MD  tadalafil (ADCIRCA/CIALIS) 20 MG tablet Take 20 mg by mouth daily.  10/06/17  Yes [provider]  traMADol (ULTRAM) 50 MG tablet Take 1 tablet (50 mg total) by mouth every 6 (six) hours as needed. 03/23/18 03/23/19 Yes  Marty Heck, MD   Current Facility-Administered Medications  Medication Dose Route Frequency Provider Last Rate Last Dose  . acetaminophen (TYLENOL) tablet 650 mg  650 mg Oral Q6H PRN Lovenia Kim, MD       Or  . acetaminophen (TYLENOL) suppository 650 mg  650 mg Rectal Q6H PRN Lovenia Kim, MD      . albuterol (PROVENTIL) (2.5 MG/3ML) 0.083% nebulizer solution 2.5 mg  2.5 mg Nebulization Q6H PRN Lovenia Kim, MD      . ambrisentan (LETAIRIS) tablet 10 mg  10  mg Oral Daily Lovenia Kim, MD      . amiodarone (PACERONE) tablet 200 mg  200 mg Oral Daily Lovenia Kim, MD      . apixaban Arne Cleveland) tablet 2.5 mg  2.5 mg Oral BID Lovenia Kim, MD   2.5 mg at 03/26/18 1107  . atorvastatin (LIPITOR) tablet 40 mg  40 mg Oral QPM Lovenia Kim, MD      . Chlorhexidine Gluconate Cloth 2 % PADS 6 each  6 each Topical Q0600 Lynnda Child, PA-C      . cinacalcet (SENSIPAR) tablet 90 mg  90 mg Oral QPM Lovenia Kim, MD      . levothyroxine (SYNTHROID, LEVOTHROID) tablet 300 mcg  300 mcg Oral Q24H Lovenia Kim, MD   300 mcg at 03/26/18 0533  . midodrine (PROAMATINE) tablet 10 mg  10 mg Oral TID WC Lovenia Kim, MD   10 mg at 03/26/18 1107  . multivitamin (RENA-VIT) tablet 1 tablet  1 tablet Oral Townsend Roger, MD   1 tablet at 03/26/18 0013  . nystatin (MYCOSTATIN/NYSTOP) topical powder 1 g  1 g Topical Daily Lovenia Kim, MD   1 g at 03/26/18 1111  . pantoprazole (PROTONIX) EC tablet 40 mg  40 mg Oral BID Lovenia Kim, MD   40 mg at 03/26/18 1107  . sodium hypochlorite (DAKIN'S 1/4 STRENGTH) topical solution   Irrigation BID Hensel, Jamal Collin, MD      . traMADol (ULTRAM) tablet 50 mg  50 mg Oral Q6H PRN Lovenia Kim, MD   50 mg at 03/26/18 0641    ROS: As per HPI otherwise negative.  Physical Exam: Vitals:   03/25/18 2328 03/26/18 0312 03/26/18 0457 03/26/18 0847  BP: (!) 89/69  (!) 80/54 (!) 79/57  Pulse: 75 73 67 69  Resp: 20  16 18   Temp: (!) 97.4 F (36.3 C)  (!)  97.5 F (36.4 C) (!) 97.5 F (36.4 C)  TempSrc: Oral  Oral Oral  SpO2: 93% 100% 100% 100%  Weight: 118.6 kg     Height: 6\' 1"  (1.854 m)        General: Obese male lying in bed NAD  Head: NCAT sclera not icteric MMM Neck: Supple. JVD to jawline  Lungs: CTAB No rales, no wheeze  Heart: RRR soft systolic murmur  Abdomen: soft NT dependent left sided edema  Lower extremities: some chronic vascular changes 1+ LE edema. L thigh wound bandaged Neuro: A & O  X 3. Moves all extremities spontaneously. Psych:  Responds to questions appropriately with a normal affect. Dialysis Access: LUE HeRO graft +bruit   Labs: Basic Metabolic Panel: Recent Labs  Lab 03/25/18 2021 03/26/18 0556  NA 134* 135  K 3.8 3.7  CL 93* 94*  CO2 28 26  GLUCOSE 61* 56*  BUN 25* 27*  CREATININE 5.36* 5.97*  CALCIUM 8.8* 8.6*   Liver Function Tests: No results for input(s): AST, ALT, ALKPHOS, BILITOT, PROT, ALBUMIN in the last 168 hours. No results for input(s): LIPASE, AMYLASE in the last 168 hours. No results for input(s): AMMONIA in the last 168 hours. CBC: Recent Labs  Lab 03/25/18 2021 03/26/18 0556  WBC 6.2 5.1  HGB 7.8* 8.1*  HCT 27.0* 27.7*  MCV 92.8 90.8  PLT 112* 103*   Cardiac Enzymes: No results for input(s): CKTOTAL, CKMB, CKMBINDEX, TROPONINI in the last 168 hours. CBG: No results for input(s): GLUCAP in the last 168 hours. Iron Studies: No results for input(s): IRON, TIBC, TRANSFERRIN, FERRITIN  in the last 72 hours. Studies/Results: Dg Chest 2 View  Result Date: 03/25/2018 CLINICAL DATA:  Shortness of breath and chest pain EXAM: CHEST - 2 VIEW COMPARISON:  02/18/2018 FINDINGS: Hero graft is again identified on the left. Prior venous stenting is noted as well. The lungs are clear bilaterally. The cardiac shadow is again enlarged but stable. Aortic calcifications are again seen. Mild vascular congestion is again noted. No bony abnormality is seen. IMPRESSION: Mild vascular congestion  consistent with mild fluid overload related to end-stage renal disease. No new focal abnormality is seen. Electronically Signed   By: Inez Catalina M.D.   On: 03/25/2018 21:26    Dialysis Orders:  Home NxStage  MTThF BFR 450 EDW 119kg  LUE HeRO No Hep  Assessment/Plan: 1.  Fluid overload 2/2 loss of body weight in ESRD patient. Will need his EDW lowered. Discussed 2.  ESRD -  MTThF usually. Off schedule this week. Plan HD today and tomorrow for volume.  3.  Hypotension/volume - Chronic hypotension. Keep SBP >70 on HD. On midodrine for BP support. UF 3L today and again tomorrow.  4.  Anemia  - Hgb 8.1. Mircera 225 dosed 11/13 at outpatient center. Follow trends. Consider transfusion if no improvement in symptoms after HD.  5.  Metabolic bone disease - Ca ok. On sensipar. No VDRA or binder on med list. Check Phos here.  6.  Nutrition - Renal diet, vitamins, prot supp 7. Afib - on amio/Eliquis 8. NICM 9. Pulm HTN   Ogechi Larina Earthly PA-C [facility] Pager [phone] 03/26/2018, 12:44 PM   Pt seen, examined and agree w A/P as above.  Kelly Splinter MD [employer] pager [phone]   03/26/2018, 1:26 PM

## 2018-03-27 DIAGNOSIS — I428 Other cardiomyopathies: Secondary | ICD-10-CM

## 2018-03-27 DIAGNOSIS — N189 Chronic kidney disease, unspecified: Secondary | ICD-10-CM

## 2018-03-27 LAB — RENAL FUNCTION PANEL
ANION GAP: 12 (ref 5–15)
Albumin: 2.2 g/dL — ABNORMAL LOW (ref 3.5–5.0)
BUN: 20 mg/dL (ref 6–20)
CO2: 27 mmol/L (ref 22–32)
Calcium: 8.2 mg/dL — ABNORMAL LOW (ref 8.9–10.3)
Chloride: 96 mmol/L — ABNORMAL LOW (ref 98–111)
Creatinine, Ser: 4.37 mg/dL — ABNORMAL HIGH (ref 0.61–1.24)
GFR calc non Af Amer: 14 mL/min — ABNORMAL LOW (ref >60)
GFR, EST AFRICAN AMERICAN: 16 mL/min — AB (ref >60)
Glucose, Bld: 56 mg/dL — ABNORMAL LOW (ref 70–99)
PHOSPHORUS: 2.3 mg/dL — AB (ref 2.5–4.6)
Potassium: 3.9 mmol/L (ref 3.5–5.1)
SODIUM: 135 mmol/L (ref 135–145)

## 2018-03-27 LAB — CBC
HEMATOCRIT: 22.8 % — AB (ref 39.0–52.0)
HEMATOCRIT: 24.8 % — AB (ref 39.0–52.0)
HEMOGLOBIN: 6.9 g/dL — AB (ref 13.0–17.0)
Hemoglobin: 7.5 g/dL — ABNORMAL LOW (ref 13.0–17.0)
MCH: 27.2 pg (ref 26.0–34.0)
MCH: 27.5 pg (ref 26.0–34.0)
MCHC: 30.3 g/dL (ref 30.0–36.0)
MCHC: 30.2 g/dL (ref 30.0–36.0)
MCV: 89.8 fL (ref 80.0–100.0)
MCV: 90.8 fL (ref 80.0–100.0)
NRBC: 0 % (ref 0.0–0.2)
Platelets: 116 10*3/uL — ABNORMAL LOW (ref 150–400)
Platelets: 112 10*3/uL — ABNORMAL LOW (ref 150–400)
RBC: 2.54 MIL/uL — ABNORMAL LOW (ref 4.22–5.81)
RBC: 2.73 MIL/uL — ABNORMAL LOW (ref 4.22–5.81)
RDW: 20.7 % — ABNORMAL HIGH (ref 11.5–15.5)
RDW: 21.1 % — ABNORMAL HIGH (ref 11.5–15.5)
WBC: 5.5 10*3/uL (ref 4.0–10.5)
WBC: 6.8 10*3/uL (ref 4.0–10.5)
nRBC: 0 % (ref 0.0–0.2)

## 2018-03-27 MED ORDER — CHLORHEXIDINE GLUCONATE CLOTH 2 % EX PADS: 6.0000 | MEDICATED_PAD | Freq: Every day | CUTANEOUS | Status: DC

## 2018-03-27 MED ORDER — MIDODRINE HCL 5 MG PO TABS
ORAL_TABLET | ORAL | Status: AC
  Filled 2018-03-27: qty 2

## 2018-03-27 NOTE — Progress Notes (Signed)
Family Medicine Teaching Service Daily Progress Note Intern Pager: [phone]  Patient name: Adam [name]. Medical record number: [id number] Date of birth: [birth year] Age: 57 y.o. Gender: male  Primary Care Provider: [name], MD Consultants: nephrology Code Status: full  Pt Overview and Major Events to Date:  11/14 admitted  Assessment and Plan: Sathvik Tiedt. is a 57 y.o. male presenting with lower extremity and abdominal swelling with shortness of breath. PMH is significant for ESRD on HD MTRF,open wound of left thigh, anemia of chronic disease, HLD, obesity s/p bariatric surgery, hypotension, insomnia and atrial fib.  DOE in setting of ESRD and HFrEF: Unchanged. Continued DOE after walking 5-10 steps for the past month or so. Likely multifactorial etiology: fluid overload in setting of missed HD sessions vs further reduced EF w/ HErEF vs symptomatic anemia. Lungs clear on exam, no increased WOB at rest. Net -1.9 L since admit, down 4 kg. Hgb 6.9 this am (baseline around 8), likely transfuse 1 U in HD today, will discuss with Nephrology.  -Nephrology on board, appreciate recs, HD scheduled today 11/16  -HF team consulted given decreased EF, no further recommendations or interventions at this time  -Repeat CBC, consideration for transfusion in HD if appropriate  -Albuterol PRN for SOB  -Strict I&Os, Fluid restriction -daily weights  -Repeat CBC as below  -vitals per routine   LE edema in setting of above: Improving. Asymmetric, 1+ pitting edema to lower abdomen on L. Chronic open ulceration on L thigh, dressing in place and evaluated by wound care. Questionable etiology, consideration for fluid overload via ESRD vs HFrEF, vs protein malnutrition (albumin 2.2), also considering lymphedema/venous stasis given extensive history of surgical venous intervention on L thigh graft.  -Monitor closely, Nephrology on board -Continued wound care for ulceration, appears  non-infectious  -Will discuss OMM/effleurage with patient to assist fluid return  -Elevated LE when possible -Strict I&O's, fluid restriction as above   ESRD on HD: M/T/Th/F schedule at home via LUE Hero graft, behind on HD.  -Nephrology following, HD 11/15 and today 11/16  -Renal diet with fluid restriction 1200 cc  HFrEF: Likely contributing to DOE. Echo 02/19/2018 EF 30-35% with G1DD and diffuse hypokinesis, prior to this 60-65%. Follows with Dr. Loletha Grayer outpatient, reviewed echo to be more EF 40-45%. Discussed with Cardiology and HF team today, no further recommendations at this time given unable to add HF medications 2/2 chronic hypotension, recommend continued HD.  -HD scheduled for today   Acute on chronic anemia: Hgb 6.9 this am, baseline around 8, due to ESRD. Receives Epo via Nephrology. Patient reports increased bleeding from graft yesterday.  -Repeat CBC  -if continued low, consideration for 1 U pRBCs during HD -Will likely hold Eliquis if low on repeat   L thigh Ulceration: Stable.  Follows with Dr. Carlis Abbott o/p. Non-infectious appearing at this time.  -Wound care consulted, recommended continued dressings -Monitor for s/sx of infection   Chronic Hypotension: Chronically SBP 80-100's. Asymptomatic.  -Continue home midodrine 10 mg 3 times daily with meals  -Vitals per protocol   A. fib: Rate controlled, in sinus currently.  -Cardiac monitoring -Continue home amiodarone and Eliquis  H/o Pulmonary hypertension: stable  -Continue ambrisentan  Hypothyroidism s/p thyroidectomy 2/2 thyroid cancer of unknown kind: stable  -Continue home levothyroxine daily   GERD: stale  -Protonix PRN  FEN/GI: Saline lock, renal diet with fluid restriction Prophylaxis: Home Eliquis 2.5 mg daily   Disposition: med-surg  Subjective: Doing well this am. States  swelling appears slightly better, but still more than his normal. DOE unchanged, no SOB at rest. Denies any N/V,  lightheadedness, dizziness, or tunnel vision.    Objective: Temp:  [97.4 F (36.3 C)-98.5 F (36.9 C)] 98.5 F (36.9 C) (11/16 0429) Pulse Rate:  [69-92] 74 (11/16 0429) Resp:  [17-20] 18 (11/16 0429) BP: (73-107)/(49-76) 73/49 (11/16 0429) SpO2:  [97 %-100 %] 97 % (11/16 0429) Weight:  [116.3 kg-120.2 kg] 116.3 kg (11/15 2115) Physical Exam: General: Alert, NAD HEENT: NCAT, MMM, oropharynx nonerythematous  Cardiac: RRR 2/6 systolic murmur noted  Lungs: Clear bilaterally, no increased WOB  Abdomen: soft large abdomen, non-tender, non-distended, normoactive BS Msk: Moves all extremities spontaneously  Ext: Warm, dry, 2+ distal pulses, 1+ pitting edema on the LLE to the level of lower abdomen with chronic venous stasis changes. L medial thigh wound with dressing in place, 4-5cm ulceration with some minor drainage and surrounding pink viable tissue. No surround erythema or warmth.    Laboratory: Recent Labs  Lab 03/25/18 2021 03/26/18 0556  WBC 6.2 5.1  HGB 7.8* 8.1*  HCT 27.0* 27.7*  PLT 112* 103*   Recent Labs  Lab 03/25/18 2021 03/26/18 0556 03/27/18 0522  NA 134* 135 135  K 3.8 3.7 3.9  CL 93* 94* 96*  CO2 28 26 27   BUN 25* 27* 20  CREATININE 5.36* 5.97* 4.37*  CALCIUM 8.8* 8.6* 8.2*  GLUCOSE 61* 56* 56*   Troponin 0.01  Imaging/Diagnostic Tests: Dg Chest 2 View  Result Date: 03/25/2018 CLINICAL DATA:  Shortness of breath and chest pain EXAM: CHEST - 2 VIEW COMPARISON:  02/18/2018 FINDINGS: Hero graft is again identified on the left. Prior venous stenting is noted as well. The lungs are clear bilaterally. The cardiac shadow is again enlarged but stable. Aortic calcifications are again seen. Mild vascular congestion is again noted. No bony abnormality is seen. IMPRESSION: Mild vascular congestion consistent with mild fluid overload related to end-stage renal disease. No new focal abnormality is seen. Electronically Signed   By: Inez Catalina M.D.   On: 03/25/2018  21:26    Patriciaann Clan, DO 03/27/2018, 7:52 AM PGY-1, [place] Intern pager: [phone], text pages welcome

## 2018-03-27 NOTE — Consult Note (Addendum)
Cardiology Consultation:   Patient ID: Adam Alm. MRN: [id number]; DOB: [birth year]  Admit date: 03/25/2018 Date of Consult: 03/27/2018  Primary Care Provider: [name], MD Primary Cardiologist: Sanda Ondre Salvetti, MD  Primary Electrophysiologist:  None    Patient Profile:   Adam Alm. is a 57 y.o. male with a hx of NICM, PAH (Dr. [place] Bells) PAD, ESRD on HD MTTHF, staph bacteremia s/p explantation MDT ICD 4/19 andextraction (EF improved to 60-65%, no reimplantation planned.) AV fistula, PAF on eliquis and amio, thyroid CA>>thyroidectomy>>thyroid replacement rx who is being seen 11/16 for the evaluation of HF at the request of Dr. Andria Frames.  History of Present Illness:   Adam [name] with above hx and was recently seen in the office and echo with EF 30-35%, moderate concentric hypertrophy G1DD,  The Ee&' ratio is >15, suggesting elevated LV filling pressure.  Moderate AS RV severely dilated along wit RA, severe TR PA pk pressure was 61 mmHg, LA moderately dilated.   Dr. Sallyanne Kuster also read and felt EF 40-45% and felt it appeared lower to very severe abnormal septal motion due to RV volume and pressure overload.  He also felt AS was moderate.  He did not believe the low EF was causing the AS.  Most concerned about the severe RV dysfunction.    Pt has low BP and on midodrine.  HD only able to take off 2-3 kg per treatment.     With all his ABX he has had diarrhea, also with melena.      At the office visit it was noted unable to start on ACE, BB any medication due to hypotension.   His PAH is followed by pulmonary team managed wi vasodilators   Now admitted on the 14th with presentation of leg swelling and increased to abd. And SOB with exertion.  Also open wound of Lt thigh, hypotension 70-80/50-60.  Pt with hx of a fib  .      EKG with SR  1st degree AV block and low voltage.  Past Medical History:  Diagnosis Date  . Abscess 02/2018   LEFT THIGH   . AICD (automatic  cardioverter/defibrillator) present    [employer] - s/p extraction 08/21/17 due to staph infection  . Anemia   . Aortic stenosis    moderate AS by 08/2016 echo  . Arthritis    "knees" (08/05/2016)  . Atrial fibrillation ([place])   . ESRD (end stage renal disease) on dialysis [facility])    ESRD due to HTN started HD 2003, had renal Tx 2009 for 18 mos. Was on NX stage hemodialysis as of 2018 but in late 2019 is back at [facility] incenter HD.   Marland Kitchen Great toe amputation status    status post left hallux amputation 08/01/14  . History of blood transfusion 2009   "S/P biopsy for prostate cancer check"  . Hypothyroidism (acquired)   . Mitral stenosis    moderate mitral stenosis  . Nonhealing surgical wound    left thigh  . Nonischemic cardiomyopathy ([place])   . Pneumonia 2014; 08/05/2016  . Pulmonary HTN ([place])   . Renal insufficiency   . Thyroid cancer ([place]) 2011    Past Surgical History:  Procedure Laterality Date  . A/V SHUNTOGRAM N/A 02/25/2018   Procedure: A/V SHUNTOGRAM;  Surgeon: Marty Heck, MD;  Location: [place] CV LAB;  Service: Cardiovascular;  Laterality: N/A;  lt arm fistula  . ARTERIAL LINE INSERTION Right 08/24/2017   Procedure: ARTERIAL LINE INSERTION INTO  RIGHT FEMORAL ARTERY;  Surgeon: Conrad Mantoloking, MD;  Location: [place];  Service: Vascular;  Laterality: Right;  . BASCILIC VEIN TRANSPOSITION Right 10/13/2017   Procedure: FIRST STAGE BRACHIAL VEIN TRANSPOSITION RIGHT ARM;  Surgeon: Conrad Meadowdale, MD;  Location: [place];  Service: Vascular;  Laterality: Right;  . BIOPSY  09/25/2017   Procedure: BIOPSY;  Surgeon: Ronnette Juniper, MD;  Location: [facility] ENDOSCOPY;  Service: Gastroenterology;;  . CARDIAC CATHETERIZATION Right 2017  . CARDIAC DEFIBRILLATOR PLACEMENT  2009  . DIALYSIS FISTULA CREATION Left 2003   "upper arm"  . DIALYSIS/PERMA CATHETER REMOVAL Left 09/01/2017   Procedure: DIALYSIS/PERMA CATHETER REMOVAL;  Surgeon: Conrad Marion, MD;  Location: [place];  Service: Vascular;   Laterality: Left;  . ESOPHAGOGASTRODUODENOSCOPY (EGD) WITH PROPOFOL N/A 09/25/2017   Procedure: ESOPHAGOGASTRODUODENOSCOPY (EGD) WITH PROPOFOL;  Surgeon: Ronnette Juniper, MD;  Location: [place];  Service: Gastroenterology;  Laterality: N/A;  . HERNIA REPAIR    . I&D EXTREMITY Right 11/06/2017   Procedure: IRRIGATION AND DEBRIDEMENT KNEE;  Surgeon: Rod Can, MD;  Location: [place];  Service: Orthopedics;  Laterality: Right;  . I&D EXTREMITY Left 01/03/2018   Procedure: IRRIGATION AND DEBRIDEMENT OF THIGH, wound vac application  wound size 10cm x 2.5cm x 2.5cm;  Surgeon: Waynetta Sandy, MD;  Location: [place];  Service: Vascular;  Laterality: Left;  . ICD LEAD REMOVAL Right 08/20/2017   Procedure: ICD EXTRACTION WITH C-ARM;  Surgeon: Evans Lance, MD;  Location[facility] OR;  Service: Cardiovascular;  Laterality: Right;  DR VAN TRIGT TO BACK-UP  . icd removed    . INSERTION OF DIALYSIS CATHETER N/A 11/11/2016   Procedure: INSERTION OF TUNNELED DIALYSIS CATHETER;  Surgeon: Angelia Mould, MD;  Location: [place];  Service: Vascular;  Laterality: N/A;  . INSERTION OF DIALYSIS CATHETER Left 08/24/2017   Procedure: INSERTION OF DIALYSIS CATHETER;  Surgeon: Conrad Etna, MD;  Location: [place];  Service: Vascular;  Laterality: Left;  . INSERTION OF DIALYSIS CATHETER Left 09/01/2017   Procedure: INSERTION OF DIALYSIS CATHETER;  Surgeon: Conrad [place], MD;  Location: [place];  Service: Vascular;  Laterality: Left;  . KIDNEY TRANSPLANT  2009  . KNEE SURGERY Bilateral 2000s   "drained fluid"  . LAPAROSCOPIC GASTRIC BANDING  2006  . LIGATION OF ARTERIOVENOUS  FISTULA Right 11/10/2017   Procedure: LIGATION OF BRACHIAL VEIN TRANSPOSITION RIGHT ARM;  Surgeon: Angelia Mould, MD;  Location: [place];  Service: Vascular;  Laterality: Right;  . MASS EXCISION N/A 01/12/2018   Procedure: EXCISION OF MULTIPLE SUBCUTANEOUS MASSES;  Surgeon: Ralene Ok, MD;  Location: [place];  Service: General;   Laterality: N/A;  . PROSTATE BIOPSY  2009  . REVISON OF ARTERIOVENOUS FISTULA Left 8/67/6195   Procedure: PLICATION OF ARTERIOVENOUS FISTULA ANEURYSM;  Surgeon: Elam Dutch, MD;  Location: [place];  Service: Vascular;  Laterality: Left;  . REVISON OF ARTERIOVENOUS FISTULA Left 11/10/2016   Procedure: REVISON OF ARTERIOVENOUS FISTULA - LEFT UPPER ARM;  Surgeon: Elam Dutch, MD;  Location: [place];  Service: Vascular;  Laterality: Left;  . REVISON OF ARTERIOVENOUS FISTULA Left 08/24/2017   Procedure: REVISON OF ARTERIOVENOUS FISTULA LEFT;  Surgeon: Conrad Hawthorne, MD;  Location: [place];  Service: Vascular;  Laterality: Left;  . TEE WITHOUT CARDIOVERSION N/A 08/20/2017   Procedure: TRANSESOPHAGEAL ECHOCARDIOGRAM (TEE);  Surgeon: Evans Lance, MD;  Location: [place];  Service: Cardiovascular;  Laterality: N/A;  . THROMBECTOMY W/ EMBOLECTOMY Left 12/25/2014   Procedure:  THROMBECTOMY ARTERIOVENOUS FISTULA;  Surgeon: Elam Dutch, MD;  Location: [place];  Service: Vascular;  Laterality: Left;  . THYROIDECTOMY  2011  . TOE AMPUTATION Bilateral     right 1st and  2nd digits; left 1st and 3rd digits"  . UMBILICAL HERNIA REPAIR  X 2  . UPPER EXTREMITY VENOGRAPHY Right 11/05/2017   Procedure: UPPER EXTREMITY VENOGRAPHY;  Surgeon: Conrad [place], MD;  Location: [place] CV LAB;  Service: Cardiovascular;  Laterality: Right;  Marland Kitchen VASCULAR ACCESS DEVICE INSERTION Left 11/10/2017   Procedure: INSERTION OF SUPER HERO VASCULAR ACCESS DEVICE LEFT ARM;  Surgeon: Angelia Mould, MD;  Location: [place];  Service: Vascular;  Laterality: Left;  Marland Kitchen VEIN HARVEST Left 08/24/2017   Procedure: VEIN HARVEST;  Surgeon: Conrad [place], MD;  Location: [place];  Service: Vascular;  Laterality: Left;  . WOUND DEBRIDEMENT Left 12/22/2017   Procedure: EXCISION OF LEFT THIGH WOUND;  Surgeon: Elam Dutch, MD;  Location: [place];  Service: Vascular;  Laterality: Left;  . WOUND DEBRIDEMENT Left 01/28/2018   Procedure: DEBRIDEMENT  WOUND LEFT THIGH;  Surgeon: Marty Heck, MD;  Location: [place];  Service: Vascular;  Laterality: Left;  . WOUND DEBRIDEMENT Right 02/23/2018   Procedure: DEBRIDEMENT AND IRRIGATION OF RIGHT FLANK WOUND;  Surgeon: Ralene Ok, MD;  Location: [place];  Service: General;  Laterality: Right;     Home Medications:  Prior to Admission medications   Medication Sig Start Date End Date Taking? Authorizing Provider  albuterol (PROVENTIL) (2.5 MG/3ML) 0.083% nebulizer solution Take 3 mLs (2.5 mg total) by nebulization every 6 (six) hours as needed for wheezing or shortness of breath. 03/17/18  Yes Lauraine Rinne, NP  ambrisentan (LETAIRIS) 10 MG tablet Take 1 tablet (10 mg total) by mouth daily. 09/11/17  Yes Juanito Doom, MD  amiodarone (PACERONE) 200 MG tablet Take 200 mg by mouth daily.    Yes [provider]  apixaban (ELIQUIS) 2.5 MG TABS tablet Take 1 tablet (2.5 mg total) by mouth 2 (two) times daily. 01/29/18  Yes Ulyses Amor, PA-C  atorvastatin (LIPITOR) 40 MG tablet Take 1 tablet (40 mg total) by mouth every evening. 12/11/17  Yes Diallo, Abdoulaye, MD  cinacalcet (SENSIPAR) 90 MG tablet Take 90 mg by mouth every evening.   Yes [provider]  levothyroxine (SYNTHROID, LEVOTHROID) 300 MCG tablet Take 1 tablet (300 mcg total) by mouth daily before breakfast. 09/26/17  Yes Enid Derry, [place], DO  midodrine (PROAMATINE) 10 MG tablet TAKE 1 TABLET (10 MG TOTAL) BY MOUTH 3 (THREE) TIMES DAILY. 10/08/17  Yes Minus Breeding, MD  multivitamin (RENA-VIT) TABS tablet TAKE 1 TABLET BY MOUTH EVERYDAY AT BEDTIME Patient taking differently: Take 1 tablet by mouth at bedtime.  01/12/18  Yes Diallo, Abdoulaye, MD  nystatin (MYCOSTATIN/NYSTOP) powder Apply topically 4 (four) times daily. Patient taking differently: Apply 1 g topically daily.  11/18/17  Yes Diallo, Abdoulaye, MD  pantoprazole (PROTONIX) 40 MG tablet Take 1 tablet (40 mg total) by mouth 2 (two) times daily. 02/26/18  02/26/19 Yes Guadalupe Dawn, MD  tadalafil (ADCIRCA/CIALIS) 20 MG tablet Take 20 mg by mouth daily.  10/06/17  Yes [provider]  traMADol (ULTRAM) 50 MG tablet Take 1 tablet (50 mg total) by mouth every 6 (six) hours as needed. 03/23/18 03/23/19 Yes Marty Heck, MD    Inpatient Medications: Scheduled Meds: . ambrisentan  10 mg Oral Daily  . amiodarone  200 mg Oral Daily  .  apixaban  2.5 mg Oral BID  . atorvastatin  40 mg Oral QPM  . Chlorhexidine Gluconate Cloth  6 each Topical Q0600  . cinacalcet  90 mg Oral QPM  . feeding supplement (NEPRO CARB STEADY)  237 mL Oral BID BM  . feeding supplement (PRO-STAT SUGAR FREE 64)  30 mL Oral TID  . levothyroxine  300 mcg Oral Q24H  . midodrine  10 mg Oral TID WC  . multivitamin  1 tablet Oral QHS  . nutrition supplement (JUVEN)  1 packet Oral BID BM  . nystatin  1 g Topical Daily  . pantoprazole  40 mg Oral BID  . sodium hypochlorite   Irrigation BID  . vitamin C  250 mg Oral BID   Continuous Infusions:  PRN Meds: acetaminophen **OR** acetaminophen, albuterol, traMADol  Allergies:    Allergies  Allergen Reactions  . Enalapril Swelling and Other (See Comments)    MOUTH SWELLING EDEMA  . Iodinated Diagnostic Agents Nausea And Vomiting and Nausea Only    Social History:   Social History   Socioeconomic History  . Marital status: Married    Spouse name: Not on file  . Number of children: 1  . Years of education: Not on file  . Highest education level: Not on file  Occupational History  . Occupation: Retired Designer, industrial/product from [employer]  . Financial resource strain: Not on file  . Food insecurity:    Worry: Not on file    Inability: Not on file  . Transportation needs:    Medical: Not on file    Non-medical: Not on file  Tobacco Use  . Smoking status: Never Smoker  . Smokeless tobacco: Never Used  Substance and Sexual Activity  . Alcohol use: Yes    Alcohol/week: 0.0 standard drinks     Comment: social  . Drug use: No  . Sexual activity: Yes  Lifestyle  . Physical activity:    Days per week: Not on file    Minutes per session: Not on file  . Stress: Not on file  Relationships  . Social connections:    Talks on phone: Not on file    Gets together: Not on file    Attends religious service: Not on file    Active member of club or organization: Not on file    Attends meetings of clubs or organizations: Not on file    Relationship status: Not on file  . Intimate partner violence:    Fear of current or ex partner: Not on file    Emotionally abused: Not on file    Physically abused: Not on file    Forced sexual activity: Not on file  Other Topics Concern  . Not on file  Social History Narrative   Retired Archivist.      Family History:    Family History  Problem Relation Age of Onset  . Heart failure Mother   . Hypertension Mother   . CAD Mother 22  . Emphysema Mother   . Hypertension Father   . Kidney failure Father      ROS:  Please see the history of present illness.  See HPI  All other ROS reviewed and negative.     Physical Exam/Data:   Vitals:   03/26/18 2115 03/26/18 2140 03/27/18 0429 03/27/18 0930  BP: 98/62 (!) 82/56 (!) 73/49 (!) 80/59  Pulse: 92 88 74 68  Resp: 19 18 18 18   Temp: 97.8 F (36.6  C) 97.7 F (36.5 C) 98.5 F (36.9 C) 97.7 F (36.5 C)  TempSrc: Oral Oral Oral Oral  SpO2: 98% 100% 97%   Weight: 116.3 kg     Height:        Intake/Output Summary (Last 24 hours) at 03/27/2018 1118 Last data filed at 03/27/2018 0951 Gross per 24 hour  Intake 1135 ml  Output 3257 ml  Net -2122 ml   Filed Weights   03/25/18 2328 03/26/18 1610 03/26/18 2115  Weight: 118.6 kg 120.2 kg 116.3 kg   Body mass index is 33.83 kg/m.  Exam per Dr. Caryl Comes     Relevant CV Studies:  Echo 02/19/18 Study Conclusions  - Left ventricle: The cavity size was normal. There was moderate   concentric hypertrophy. Systolic  function was moderately to   severely reduced. The estimated ejection fraction was in the   range of 30% to 35%. Diffuse hypokinesis. Doppler parameters are   consistent with abnormal left ventricular relaxation (grade 1   diastolic dysfunction). The Ee&' ratio is >15, suggesting elevated   LV filling pressure. - Ventricular septum: Septal motion showed abnormal function and   dyssynergy. The contour showed diastolic flattening and systolic   flattening. - Aortic valve: Calcified with restricted leaflet motion. There is   moderate stenosis. Mean gradient (S): 26 mm Hg. Peak gradient   (S): 41 mm Hg. Valve area (VTI): 1.16 cm^2. Valve area (Vmax):   1.2 cm^2. Valve area (Vmean): 1.05 cm^2. - Mitral valve: Valve area by continuity equation (using LVOT   flow): 1.83 cm^2. - Left atrium: Moderately dilated. - Right ventricle: The cavity size was severely dilated. AICD noted   in right ventricle. - Right atrium: Severely dilated. AICD noted in right atrium. - Tricuspid valve: There was severe regurgitation. - Pulmonary arteries: PA peak pressure: 61 mm Hg (S). - Inferior vena cava: The vessel was dilated. The respirophasic   diameter changes were blunted (< 50%), consistent with elevated   central venous pressure.  Impressions:  - Compared to a prior study in 08/2017, the LVEF is lower at 30-35%   with severe global hypokinesis, grade 1 DD and elevated LV   filling pressure. There is severe RV enlargement with moderate RV   Systolic dysfunction, severe TR and severe RAE, RVSP is 61 mmHg,   the IVC is dilated. The aortic valve is calcified with moderate   stenosis - mean gradient of 26 mmHg and AVA around 1.2 cm2.   Laboratory Data:  Chemistry Recent Labs  Lab 03/25/18 2021 03/26/18 0556 03/27/18 0522  NA 134* 135 135  K 3.8 3.7 3.9  CL 93* 94* 96*  CO2 28 26 27   GLUCOSE 61* 56* 56*  BUN 25* 27* 20  CREATININE 5.36* 5.97* 4.37*  CALCIUM 8.8* 8.6* 8.2*  GFRNONAA 11* 10*  14*  GFRAA 13* 11* 16*  ANIONGAP 13 15 12     Recent Labs  Lab 03/27/18 0522  ALBUMIN 2.2*   Hematology Recent Labs  Lab 03/25/18 2021 03/26/18 0556 03/27/18 0522  WBC 6.2 5.1 5.5  RBC 2.91* 3.05* 2.54*  HGB 7.8* 8.1* 6.9*  HCT 27.0* 27.7* 22.8*  MCV 92.8 90.8 89.8  MCH 26.8 26.6 27.2  MCHC 28.9* 29.2* 30.3  RDW 21.0* 20.8* 20.7*  PLT 112* 103* 116*   Cardiac EnzymesNo results for input(s): TROPONINI in the last 168 hours.  Recent Labs  Lab 03/25/18 2032  TROPIPOC 0.01    BNPNo results for input(s): BNP, PROBNP in the  last 168 hours.  DDimer No results for input(s): DDIMER in the last 168 hours.  Radiology/Studies:  Dg Chest 2 View  Result Date: 03/25/2018 CLINICAL DATA:  Shortness of breath and chest pain EXAM: CHEST - 2 VIEW COMPARISON:  02/18/2018 FINDINGS: Hero graft is again identified on the left. Prior venous stenting is noted as well. The lungs are clear bilaterally. The cardiac shadow is again enlarged but stable. Aortic calcifications are again seen. Mild vascular congestion is again noted. No bony abnormality is seen. IMPRESSION: Mild vascular congestion consistent with mild fluid overload related to end-stage renal disease. No new focal abnormality is seen. Electronically Signed   By: Inez Catalina M.D.   On: 03/25/2018 21:26    Assessment and Plan:   1. Cardiomyopathy --unable to add medications due to severe hypotension on midodrine.  His volume overload is difficult with dialysis due to hypotension as well.  With recent bacteremia adding CRT therapy is not really an option.  Palliative care may be the most helpful.          For questions or updates, please contact [place] Please consult [email or web address] for contact info under     Signed, Cecilie Kicks, NP  03/27/2018 11:18 AM  BP (!) 80/59 (BP Location: Right Arm)   Pulse 68   Temp 97.7 F (36.5 C) (Oral)   Resp 18   Ht 6\' 1"  (1.854 m)   Wt 116.3 kg Comment: stand up scale  SpO2 97%   BMI  33.83 kg/m   Well developed and nourished obese in no acute distress HENT normal Neck supple   Clear Regular rate and rhythm, 2-6 m at apex, and RUSB;  Abd-soft with active BS No Clubbing cyanosis edema Graft in L arm  Skin-warm and dry A & Oriented  Grossly normal sensory and motor function  Assessment and recommendations  Cardiomyopathy-nonischemic  Pulmonary hypertension-severe  Aortic stenosis-moderate  End-stage renal disease on dialysis  Atrial fibrillation on amiodarone and apixaban  Hypotension   Remarkably functional gentleman given the panoply of illness.  However, from a cardiac point review options are extremely limited.  Blood pressure precludes afterload reduction.  It also makes dialysis difficult which is his main way of managing fluids.  Pulmonary hypertension is severe.  Not withstanding not knowing him, broached the topics of severe and fragile comorbidities.  He is aware and has had some discussions with his wife.  Agree with Dr. Andria Frames palliative care discussions are appropriate.  Dr. Thermon Leyland knows the patient well and will let him know of his hospitalization  Call if we can be of further assistance

## 2018-03-27 NOTE — Progress Notes (Signed)
[facility] Progress Note  Subjective:3.5 L off yest, wants to run again today  Vitals:   03/26/18 2115 03/26/18 2140 03/27/18 0429 03/27/18 0930  BP: 98/62 (!) 82/56 (!) 73/49 (!) 80/59  Pulse: 92 88 74 68  Resp: 19 18 18 18   Temp: 97.8 F (36.6 C) 97.7 F (36.5 C) 98.5 F (36.9 C) 97.7 F (36.5 C)  TempSrc: Oral Oral Oral Oral  SpO2: 98% 100% 97%   Weight: 116.3 kg     Height:        Inpatient medications: . ambrisentan  10 mg Oral Daily  . amiodarone  200 mg Oral Daily  . apixaban  2.5 mg Oral BID  . atorvastatin  40 mg Oral QPM  . Chlorhexidine Gluconate Cloth  6 each Topical Q0600  . cinacalcet  90 mg Oral QPM  . feeding supplement (NEPRO CARB STEADY)  237 mL Oral BID BM  . feeding supplement (PRO-STAT SUGAR FREE 64)  30 mL Oral TID  . levothyroxine  300 mcg Oral Q24H  . midodrine  10 mg Oral TID WC  . multivitamin  1 tablet Oral QHS  . nutrition supplement (JUVEN)  1 packet Oral BID BM  . nystatin  1 g Topical Daily  . pantoprazole  40 mg Oral BID  . sodium hypochlorite   Irrigation BID  . vitamin C  250 mg Oral BID    acetaminophen **OR** acetaminophen, albuterol, traMADol  Iron/TIBC/Ferritin/ %Sat    Component Value Date/Time   IRON 41 (L) 01/04/2018 1630   TIBC 106 (L) 01/04/2018 1630   FERRITIN 2,257 (H) 08/09/2016 0433   IRONPCTSAT 39 01/04/2018 1630    Exam: : Obese male lying in bed NAD  Head: NCAT sclera not icteric MMM Neck: Supple. JVD to jawline  Lungs: CTAB No rales, no wheeze  Heart: RRR soft systolic murmur  Abdomen: soft NT dependent left sided edema  Lower extremities: some chronic vascular changes 1+ LE edema. L thigh wound bandaged Neuro: A & O  X 3. Moves all extremities spontaneously. Psych:  Responds to questions appropriately with a normal affect. Dialysis Access: LUE HeRO graft +bruit     Dialysis Orders:  Home NxStage  MTThF BFR 450 EDW 119kg  LUE HeRO No Hep  Assessment/Plan: 1. Fluid overload 2/2  loss of body weight in ESRD patient. HD again today, lowering edw. Should be OK for dc after HD today.  2. ESRD -  MTThF, off sched this week. Home HD. 3. Hypotension/volume - Chronic hypotension. Keep SBP >70 on HD. On midodrine for BP support. HD max UF again today.  4. Anemia  - Hgb 8.1. Mircera 225 dosed 11/13 at outpatient center. Follow trends. Consider transfusion if no improvement in symptoms after HD.  5. Afib - on amio/Eliquis 6. NICM 7. Pulm HTN     Kelly Splinter MD [facility] pager [phone]   03/27/2018, 12:35 PM   Recent Labs  Lab 03/26/18 0556 03/27/18 0522  NA 135 135  K 3.7 3.9  CL 94* 96*  CO2 26 27  GLUCOSE 56* 56*  BUN 27* 20  CREATININE 5.97* 4.37*  CALCIUM 8.6* 8.2*  PHOS  --  2.3*  ALBUMIN  --  2.2*   No results for input(s): AST, ALT, ALKPHOS, BILITOT, PROT in the last 168 hours. Recent Labs  Lab 03/27/18 0522 03/27/18 1053  WBC 5.5 6.8  HGB 6.9* 7.5*  HCT 22.8* 24.8*  MCV 89.8 90.8  PLT 116* 112*

## 2018-03-27 NOTE — Progress Notes (Signed)
CRITICAL VALUE ALERT  Critical Value: Hemoglobin 6.9  Date & Time Notied: 11/16 0815  Provider Notified: Family medicine resident  Orders Received/Actions taken: following any new orders

## 2018-03-28 LAB — CBC WITH DIFFERENTIAL/PLATELET
Abs Immature Granulocytes: 0.03 10*3/uL (ref 0.00–0.07)
BASOS ABS: 0 10*3/uL (ref 0.0–0.1)
BASOS PCT: 0 %
EOS ABS: 0 10*3/uL (ref 0.0–0.5)
EOS PCT: 0 %
HCT: 23.5 % — ABNORMAL LOW (ref 39.0–52.0)
HEMOGLOBIN: 7.1 g/dL — AB (ref 13.0–17.0)
Immature Granulocytes: 1 %
LYMPHS PCT: 12 %
Lymphs Abs: 0.7 10*3/uL (ref 0.7–4.0)
MCH: 27.3 pg (ref 26.0–34.0)
MCHC: 30.2 g/dL (ref 30.0–36.0)
MCV: 90.4 fL (ref 80.0–100.0)
Monocytes Absolute: 0.6 10*3/uL (ref 0.1–1.0)
Monocytes Relative: 11 %
NRBC: 0 % (ref 0.0–0.2)
Neutro Abs: 4.4 10*3/uL (ref 1.7–7.7)
Neutrophils Relative %: 76 %
PLATELETS: 113 10*3/uL — AB (ref 150–400)
RBC: 2.6 MIL/uL — AB (ref 4.22–5.81)
RDW: 20.9 % — AB (ref 11.5–15.5)
WBC: 5.7 10*3/uL (ref 4.0–10.5)

## 2018-03-28 LAB — BASIC METABOLIC PANEL
Anion gap: 11 (ref 5–15)
BUN: 13 mg/dL (ref 6–20)
CALCIUM: 7.9 mg/dL — AB (ref 8.9–10.3)
CO2: 29 mmol/L (ref 22–32)
CREATININE: 3.66 mg/dL — AB (ref 0.61–1.24)
Chloride: 96 mmol/L — ABNORMAL LOW (ref 98–111)
GFR, EST AFRICAN AMERICAN: 20 mL/min — AB (ref >60)
GFR, EST NON AFRICAN AMERICAN: 17 mL/min — AB (ref >60)
Glucose, Bld: 63 mg/dL — ABNORMAL LOW (ref 70–99)
Potassium: 3.4 mmol/L — ABNORMAL LOW (ref 3.5–5.1)
SODIUM: 136 mmol/L (ref 135–145)

## 2018-03-28 LAB — PREPARE RBC (CROSSMATCH)

## 2018-03-28 LAB — HEMOGLOBIN AND HEMATOCRIT, BLOOD
HCT: 27.8 % — ABNORMAL LOW (ref 39.0–52.0)
HEMOGLOBIN: 8.2 g/dL — AB (ref 13.0–17.0)

## 2018-03-28 MED ORDER — SODIUM CHLORIDE 0.9% IV SOLUTION
Freq: Once | INTRAVENOUS | Status: AC
  Administered 2018-03-28: 17:00:00 via INTRAVENOUS

## 2018-03-28 MED ORDER — CHLORHEXIDINE GLUCONATE CLOTH 2 % EX PADS: 6.0000 | MEDICATED_PAD | Freq: Every day | CUTANEOUS | Status: DC

## 2018-03-28 NOTE — Discharge Instructions (Signed)

## 2018-03-28 NOTE — Progress Notes (Signed)
Family Medicine Teaching Service Daily Progress Note Intern Pager: [phone]  Patient name: Adam [name]. Medical record number: [id number] Date of birth: 06-01-1960 Age: 57 y.o. Gender: male  Primary Care Provider: [name], MD Consultants: nephrology, cardiology Code Status: FULL  Pt Overview and Major Events to Date:  11/14 admitted with ?fluid overload and access issues 11/15 HD 11/16 HD  Assessment and Plan: Adam Singhis a 57 y.o.malepresenting with lower extremityand abdominalswellingwithshortness of breath. PMH is significant forESRD on HDMTRF,open wound of left thigh, anemia of chronic disease, HLD,obesity s/pbariatricsurgery,hypotension,insomniaandatrialfib.  DOE in setting of ESRD and HFrEF: Stable. C Likely multifactorial etiology: fluid overload in setting of missed HD sessions vs further reduced EF w/ HErEF vs symptomatic anemia. Anuric. Fluid management with HD, lowering EDW.  -Nephrology on board, appreciate recs: discussed with Dr. Melvia Heaps, will transfuse 2U PRBC and keep overnight to reassess fluid status in am  -Albuterol PRN for SOB  -Strict I&Os, Fluid restriction -daily weights  -vitals per routine   Acute on chronic anemia: Anemia 2/2 CKD, question whether acute worsening is 2/2 reported access bleeding. Receives Epo via Nephrology. Hgb 8.4 as outpatient > 7.8 >8.1 > 6.9 > 7.5 > 7.1.  -nephro in agreement to continue eliquis unless overt sxs of bleeding  LE edema in setting of above: Stable. Patient reports his legs at baseline today.  -Continued wound care for ulceration, appears non-infectious  -Elevated LE when possible -Strict I&O's, fluid restriction as above   Goals of care: overall, patient has large medical burden, he is willing to discuss with palliative either inpatient or outpatient pending dispo plans today.  -palliative consult  HFrEF: Likely contributing to DOE. Echo 02/19/2018 EF 30-35% with G1DD and  diffuse hypokinesis, prior to this 60-65%. Follows with Dr. Loletha Grayer outpatient, reviewed echo to be more EF 40-45%.  -appreciate cardiology input, meds are optimized given concomitant hypotension -palliative consult inpatient vs outpatient   L thigh Ulceration: Stable.  Follows with Dr. Carlis Abbott o/p. Non-infectious appearing at this time.  -Wound care consulted, recommended continued dressings -Monitor for s/sx of infection   Chronic Hypotension:Chronically SBP 80-100's. Asymptomatic.  -Continue home midodrine 10 mg 3 times daily with meals -Vitals per protocol  A. fib: Rate controlled, in sinus currently.  -Cardiac monitoring -Continue home amiodarone and Eliquis  H/o Pulmonary hypertension: stable  -Continue ambrisentan  Hypothyroidism s/p thyroidectomy 2/2 thyroid cancer of unknown kind: stable  -Continue home levothyroxine daily  GERD: stale  -Protonix PRN  FEN/GI:Saline lock, renal diet with fluid restriction Prophylaxis:Home Eliquis 2.5 mg daily  Disposition: pending transfusion  Subjective:  Patient putting his dentures in this morning, ambulating around the room without respiratory distress.  Sits on edge of bed comfortably.  States he has felt short of breath with walking for greater than 1 month now.  He attributes all this to his recently reduced EF.  He states he was having no issues breathing before that time.  I reiterated cardiology's plan from their note yesterday.  Also discussed with patient that he has quite a burden of medical illness and getting palliative care on board may be helpful.  He is willing to have this discussion.  He also states he feels rather cold in addition to being short of breath, and he is on board with blood transfusion.  Objective: Temp:  [97.7 F (36.5 C)-98.6 F (37 C)] 98.6 F (37 C) (11/17 0503) Pulse Rate:  [68-75] 75 (11/17 0503) Resp:  [15-20] 18 (11/17 0503) BP: (71-86)/(40-59)  86/52 (11/17 0503) SpO2:  [96 %-100 %] 100  % (11/17 0503) Weight:  [112.5 kg-117.3 kg] 112.5 kg (11/17 0412) Physical Exam: General: patient ambulating around the room, comfortable sitting on EOB Cardiovascular: 3/6 systolic murmur, RRR Respiratory: CTAB, no increased WOB  Abdomen: SNTND Extremities: 1+ edema bilaterally  Laboratory: Recent Labs  Lab 03/27/18 0522 03/27/18 1053 03/28/18 0454  WBC 5.5 6.8 5.7  HGB 6.9* 7.5* 7.1*  HCT 22.8* 24.8* 23.5*  PLT 116* 112* 113*   Recent Labs  Lab 03/26/18 0556 03/27/18 0522 03/28/18 0454  NA 135 135 136  K 3.7 3.9 3.4*  CL 94* 96* 96*  CO2 26 27 29   BUN 27* 20 13  CREATININE 5.97* 4.37* 3.66*  CALCIUM 8.6* 8.2* 7.9*  GLUCOSE 56* 56* 63*    Imaging/Diagnostic Tests: No results found.   Sela Hilding, MD 03/28/2018, 7:38 AM PGY-3, [place] Intern pager: [phone], text pages welcome

## 2018-03-28 NOTE — Progress Notes (Signed)
[facility] Progress Note  Subjective:m 4.7 L NET off yest on HD w/ stable BP's in the 70's and asymptomatic on HD   Vitals:   03/27/18 2037 03/28/18 0412 03/28/18 0503 03/28/18 0925  BP: (!) 73/53  (!) 86/52 (!) 81/63  Pulse: 75  75 81  Resp: 18  18 18   Temp: 98.3 F (36.8 C)  98.6 F (37 C) 98 F (36.7 C)  TempSrc: Oral   Oral  SpO2: 96%  100% 100%  Weight: 112.5 kg 112.5 kg    Height:        Inpatient medications: . sodium chloride   Intravenous Once  . ambrisentan  10 mg Oral Daily  . amiodarone  200 mg Oral Daily  . apixaban  2.5 mg Oral BID  . atorvastatin  40 mg Oral QPM  . Chlorhexidine Gluconate Cloth  6 each Topical Q0600  . cinacalcet  90 mg Oral QPM  . feeding supplement (NEPRO CARB STEADY)  237 mL Oral BID BM  . feeding supplement (PRO-STAT SUGAR FREE 64)  30 mL Oral TID  . levothyroxine  300 mcg Oral Q24H  . midodrine  10 mg Oral TID WC  . multivitamin  1 tablet Oral QHS  . nutrition supplement (JUVEN)  1 packet Oral BID BM  . nystatin  1 g Topical Daily  . pantoprazole  40 mg Oral BID  . sodium hypochlorite   Irrigation BID  . vitamin C  250 mg Oral BID    acetaminophen **OR** acetaminophen, albuterol, traMADol  Iron/TIBC/Ferritin/ %Sat    Component Value Date/Time   IRON 41 (L) 01/04/2018 1630   TIBC 106 (L) 01/04/2018 1630   FERRITIN 2,257 (H) 08/09/2016 0433   IRONPCTSAT 39 01/04/2018 1630    Exam: : Obese male lying in bed NAD  Head: NCAT sclera not icteric MMM Neck: Supple. JVD to jawline  Lungs: CTAB No rales, no wheeze  Heart: RRR soft systolic murmur  Abdomen: soft NT dependent left sided edema is about 50% better Lower extremities: some chronic vascular changes 1+ LE edema. L thigh wound bandaged Neuro: A & O  X 3. Moves all extremities spontaneously. Psych:  Responds to questions appropriately with a normal affect. Dialysis Access: LUE HeRO graft +bruit     Dialysis Orders:  Home NxStage  MTThF BFR 450 EDW  119kg  LUE HeRO No Hep  Assessment/Plan: 1. Fluid overload/ body edema -  2/2 loss of body weight in ESRD patient. Wt's down 7kg under dry wt now.  2. ESRD -  MTThF, off sched this week. Home HD.  Plan HD tomorrow.  3. CM EF 30%/ severe R HF/ severe pulm HTN - seen by cardiology, limited options 4. Hypotension/volume - Chronic hypotension. Keep SBP >70 on HD. On midodrine 5. Anemia  - Hgb down to 7.1. Mircera 225 dosed 11/13 at outpatient center. Have d/w primary team given pt's fatigue/ DOE would consider transfusing to higher Hb like 9-10, see if this helps.   6. Chronic Afib - on amio/Eliquis  Kelly Splinter MD [place] pager [phone]   03/28/2018, 1:03 PM   Recent Labs  Lab 03/27/18 0522 03/28/18 0454  NA 135 136  K 3.9 3.4*  CL 96* 96*  CO2 27 29  GLUCOSE 56* 63*  BUN 20 13  CREATININE 4.37* 3.66*  CALCIUM 8.2* 7.9*  PHOS 2.3*  --   ALBUMIN 2.2*  --    No results for input(s): AST, ALT, ALKPHOS, BILITOT, PROT  in the last 168 hours. Recent Labs  Lab 03/27/18 1053 03/28/18 0454  WBC 6.8 5.7  NEUTROABS  --  4.4  HGB 7.5* 7.1*  HCT 24.8* 23.5*  MCV 90.8 90.4  PLT 112* 113*

## 2018-03-29 ENCOUNTER — Encounter (HOSPITAL_COMMUNITY): Payer: Self-pay | Admitting: Primary Care

## 2018-03-29 DIAGNOSIS — Z515 Encounter for palliative care: Secondary | ICD-10-CM

## 2018-03-29 DIAGNOSIS — T829XXS Unspecified complication of cardiac and vascular prosthetic device, implant and graft, sequela: Secondary | ICD-10-CM

## 2018-03-29 DIAGNOSIS — R0602 Shortness of breath: Secondary | ICD-10-CM

## 2018-03-29 DIAGNOSIS — Z7189 Other specified counseling: Secondary | ICD-10-CM

## 2018-03-29 LAB — TYPE AND SCREEN
ABO/RH(D): O POS
ANTIBODY SCREEN: NEGATIVE
Unit division: 0
Unit division: 0

## 2018-03-29 LAB — CBC WITH DIFFERENTIAL/PLATELET
Abs Immature Granulocytes: 0.04 10*3/uL (ref 0.00–0.07)
BASOS PCT: 0 %
Basophils Absolute: 0 10*3/uL (ref 0.0–0.1)
EOS ABS: 0.1 10*3/uL (ref 0.0–0.5)
Eosinophils Relative: 1 %
HEMATOCRIT: 28.5 % — AB (ref 39.0–52.0)
Hemoglobin: 8.6 g/dL — ABNORMAL LOW (ref 13.0–17.0)
Immature Granulocytes: 1 %
LYMPHS ABS: 0.7 10*3/uL (ref 0.7–4.0)
Lymphocytes Relative: 11 %
MCH: 27.4 pg (ref 26.0–34.0)
MCHC: 30.2 g/dL (ref 30.0–36.0)
MCV: 90.8 fL (ref 80.0–100.0)
MONO ABS: 0.6 10*3/uL (ref 0.1–1.0)
MONOS PCT: 10 %
Neutro Abs: 4.6 10*3/uL (ref 1.7–7.7)
Neutrophils Relative %: 77 %
PLATELETS: 105 10*3/uL — AB (ref 150–400)
RBC: 3.14 MIL/uL — ABNORMAL LOW (ref 4.22–5.81)
RDW: 20.2 % — AB (ref 11.5–15.5)
WBC: 6 10*3/uL (ref 4.0–10.5)
nRBC: 0 % (ref 0.0–0.2)

## 2018-03-29 LAB — RENAL FUNCTION PANEL
ALBUMIN: 2.4 g/dL — AB (ref 3.5–5.0)
Anion gap: 13 (ref 5–15)
BUN: 22 mg/dL — AB (ref 6–20)
CALCIUM: 7.5 mg/dL — AB (ref 8.9–10.3)
CO2: 25 mmol/L (ref 22–32)
CREATININE: 4.79 mg/dL — AB (ref 0.61–1.24)
Chloride: 95 mmol/L — ABNORMAL LOW (ref 98–111)
GFR calc Af Amer: 14 mL/min — ABNORMAL LOW (ref >60)
GFR calc non Af Amer: 12 mL/min — ABNORMAL LOW (ref >60)
Glucose, Bld: 67 mg/dL — ABNORMAL LOW (ref 70–99)
PHOSPHORUS: 2.3 mg/dL — AB (ref 2.5–4.6)
Potassium: 3.7 mmol/L (ref 3.5–5.1)
SODIUM: 133 mmol/L — AB (ref 135–145)

## 2018-03-29 LAB — BPAM RBC
Blood Product Expiration Date: 201912152359
Blood Product Expiration Date: 201912162359
ISSUE DATE / TIME: 201911171347
ISSUE DATE / TIME: 201911171631
UNIT TYPE AND RH: 5100
Unit Type and Rh: 5100

## 2018-03-29 MED ORDER — TRAMADOL HCL 50 MG PO TABS
ORAL_TABLET | ORAL | Status: AC
  Filled 2018-03-29: qty 1

## 2018-03-29 NOTE — Progress Notes (Signed)
Family Medicine Teaching Service Daily Progress Note Intern Pager: [phone]  Patient name: Adam [name]. Medical record number: [id number] Date of birth: 04/21/1961 Age: 57 y.o. Gender: male  Primary Care Provider: [name], MD Consultants: nephrology, cardiology Code Status: FULL  Pt Overview and Major Events to Date:  11/14 admitted with ?fluid overload and access issues 11/15 HD 11/16 HD 11/17 - transfused 2 Units PRBCs 11/18 HD  Assessment and Plan: Adam Singhis a 57 y.o.malepresenting with lower extremityand abdominalswellingwithshortness of breath. PMH is significant forESRD on HDMTRF,open wound of left thigh, anemia of chronic disease, HLD,obesitys/pbariatricsurgery,hypotension,insomniaandatrialfib.  Dyspnea On Exertion in setting of ESRD and HFrEF: Stable. C Likely multifactorial etiology: fluid overload in setting of missed HD sessions vs further reduced EF w/ HErEF vs symptomatic anemia. Anuric. Fluid management with HD, lowering EDW. He has lost 8kg (down about 6.7 L) since admission via dialysis. -Nephrology on board, appreciate recs: discussed with Adam [name], will transfuse 2U PRBC and keep overnight to reassess fluid status in am  -Albuterol PRN for SOB  -Strict I&Os, Fluid restriction -daily weights  -vitals per routine   Acute on chronic anemia: Anemia 2/2 CKD, question whether acute worsening is 2/2 reported access bleeding. Receives Epo via Nephrology. Hgb 8.4 as outpatient > 7.8 >8.1 > 6.9 > 7.5 > 7.1 > 2U PRBC > 8.2 > 8.6 on 11/18 -Nephro in agreement to continue eliquis unless overt sxs of bleeding  LE edema in setting of above, stable: patient reports his legs at baseline today.  -Continued wound care for ulceration, appears non-infectious  -Elevated LE when possible -Strict I&O's, fluid restriction as above   Goals of care: overall, patient has large medical burden, he is willing to discuss with palliative  either inpatient or outpatient pending dispo plans today.  -palliative consult  HFrEF:Likely contributing to DOE.Echo 02/19/2018 EF 30-35% with G1DD and diffuse hypokinesis, prior to this 60-65%. Follows with Adam [name] outpatient, reviewed echo to be more EF 40-45%.  -appreciate cardiology input, meds are optimized given concomitant hypotension -palliative consult inpatient vs outpatient   L thigh Ulceration, stable: Follows withDr. Carlis [name] o/p. Non-infectious appearing at this time. -Wound care consulted, recommended continued dressings -Monitor for s/sx of infection  ChronicHypotension: BP 85/54 on 11/18.Chronically SBP 80-100's. Asymptomatic. -Continuehomemidodrine 10 mg 3 times daily with meals -Vitals per protocol  A. CBJ:SEGB controlled, in sinus currently. -Cardiac monitoring -Continuehomeamiodarone and Eliquis  H/o Pulmonary hypertension: stable -Continue ambrisentan  Hypothyroidism s/p thyroidectomy 2/2 thyroid cancer of unknown kind: stable -Continuehomelevothyroxine daily  GERD: stale -Protonix PRN  FEN/GI:Saline lock, renal diet with fluid restriction Prophylaxis:Home Eliquis 2.5 mg daily  Disposition:  Medically cleared for discharge home  Subjective:  Patient seen this morning during hemodialysis, resting.  He states he is feeling better and that his shortness of breath with exertion is improving.  He states he feels comfortable going home today and following up with dialysis outpatient.  He denies chest pain, shortness of breath, nausea, vomiting, and changes in bowel habits.  He states he continues to have left lower extremity swelling, but this is chronic.  He states the abdominal swelling is slightly improved.  Objective: Temp:  [97.2 F (36.2 C)-98.3 F (36.8 C)] 97.5 F (36.4 C) (11/18 0640) Pulse Rate:  [60-77] 72 (11/18 1030) Resp:  [16-18] 16 (11/18 0640) BP: (57-86)/(36-59) 85/54 (11/18 1030) SpO2:  [92 %-99 %] 93 %  (11/18 0446) Weight:  [112.5 kg-113.8 kg] 113.8 kg (11/18 0640)  Physical Exam  Constitutional:  He is oriented to person, place, and time. He appears well-developed and well-nourished. No distress.  Pleasant  Eyes: EOM are normal.  Cardiovascular: Intact distal pulses.  Irregularly irregular rhythm, rate controlled around 80, grade 3/6 systolic murmur auscultated, stable  Pulmonary/Chest: Effort normal.  Coarse breath sounds with minimal crackles in bilateral lower lobes, improved.  Abdominal: Soft.  Musculoskeletal: He exhibits edema (3+ pitting left from ankle up to thigh; 2+ nonpitting R lower extremity).  Neurological: He is alert and oriented to person, place, and time.  Skin: Skin is warm and dry.  Wound to anterior left thigh clean with dressing in place.  Psychiatric: He has a normal mood and affect.    Laboratory: Recent Labs  Lab 03/27/18 1053 03/28/18 0454 03/28/18 2156 03/29/18 0530  WBC 6.8 5.7  --  6.0  HGB 7.5* 7.1* 8.2* 8.6*  HCT 24.8* 23.5* 27.8* 28.5*  PLT 112* 113*  --  105*   Recent Labs  Lab 03/27/18 0522 03/28/18 0454 03/29/18 0530  NA 135 136 133*  K 3.9 3.4* 3.7  CL 96* 96* 95*  CO2 27 29 25   BUN 20 13 22*  CREATININE 4.37* 3.66* 4.79*  CALCIUM 8.2* 7.9* 7.5*  GLUCOSE 56* 63* 67*   Imaging/Diagnostic Tests: Dg Chest 2 View  Result Date: 03/25/2018 CLINICAL DATA:  Shortness of breath and chest pain EXAM: CHEST - 2 VIEW COMPARISON:  02/18/2018 FINDINGS: Hero graft is again identified on the left. Prior venous stenting is noted as well. The lungs are clear bilaterally. The cardiac shadow is again enlarged but stable. Aortic calcifications are again seen. Mild vascular congestion is again noted. No bony abnormality is seen. IMPRESSION: Mild vascular congestion consistent with mild fluid overload related to end-stage renal disease. No new focal abnormality is seen. Electronically Signed   By: Adam [name] M.D.   On: 03/25/2018 21:26      Daisy Floro, DO 03/29/2018, 10:52 AM PGY-1, [place] Intern pager: [phone], text pages welcome

## 2018-03-29 NOTE — Consult Note (Signed)
   [facility] CM Inpatient Consult   03/29/2018  Adam [name]. October 26, 1960 [id number]    Patient screened for potential [facility] Care Management services due to unplanned readmission risk score of 51% (extreme) and multiple hospitalizations.  Went to bedside to offer and explain [facility] Care Management program with [name].  He denies having transition of care, care coordination, disease management, disease monitoring, transportation, community resource, or pharmacy needs at this time. Declined [facility] Care Management services.  Accepted [facility] Care Management brochure with contact information to call in future if changes mind.   Marthenia Rolling, MSN-Ed, RN,BSN [facility] Liaison [phone]

## 2018-03-29 NOTE — Telephone Encounter (Signed)
Dr. Lake Bells are you okay with this patient seeing Aaron Edelman on 04/21/18 with PFT and then you in January?

## 2018-03-29 NOTE — Consult Note (Signed)
Consultation Note Date: 03/29/2018   Patient Name: Adam [name].  DOB: 08-Dec-1960  MRN: [id number]  Age / Sex: 57 y.o., male  PCP: [name], MD Referring Physician: Zenia Resides, MD  Reason for Consultation: Establishing goals of care and Psychosocial/spiritual support  HPI/Patient Profile: 57 y.o. male  with past medical history of end-stage renal disease on (at home) hemodialysis (due to uncontrolled hypertension) for 16 years, anemia of chronic disease, history of MSSA bacteremia April 2019 with acute respiratory failure and intubation for approximately 2 days, long-term current use of amiodarone, critical lower limb ischemia, history of gastric banding 2006, osteomyelitis of left foot with amputation, malignant neoplasm of thyroid history, AICD March 2016, pulmonary hypertension, admitted on 03/25/2018 with dyspnea on exertion in setting of end-stage renal disease, weight loss.  PMT consulted for goals of care related to large burden of medical illness, chronic hypotension with HD and HF.  Clinical Assessment and Goals of Care: I see Adam [name] initially in the HD suite.  He is finishing his treatment, so we plan to meet in his room after lunch.    Adam [name] is resting quietly in bed.  He greets me making and mostly keeping eye contact, he is calm and cooperative, pleasant.  There is no family at bedside at this time.  We talked in detail about his hemodialysis history.  He tells me that he is currently taking hemodialysis at home.  His wife helps cannulate his graft, but he manages his HD.  He tells me that he has been taking hemodialysis for 16 years.  Ask about his access.  He tells me that he had the same dialysis access for 15 years, but now has a hero graft.  He shares that he had bacteremia in April of this year, he had to change access.  He shares that he was also intubated for 2 days.   We talked about multiple hospitalizations and ED visits in 6 months.  Adam [name] tells me that most of the hospitalizations were related to an arterial bleed from a wound in his thigh.  We talked about his functional status and home life.  He tells me that his wife still works, and she is in relatively good health.  He tells me that he is independent with ADLs and IADLs, even driving.  He shares that his daughter who lived in [place] recently moved closer to home, she is available to help care for him as needed.  We talked about healthcare power of attorney, see below. We talked about CODE STATUS, see below.  At this point, Adam [name] wants full code, full scope.  I ask about length of time for intubation.  2 weeks, 2 months, 2 years?  We talked about trach after 2 weeks of oral intubation.  I encourage Adam [name] to consider these choices, what is important to him.  We talked about natural disease trajectory, rehospitalizations, decreasing functional status.  We also talked about preferred place of death, he tells me he  has not considered this, and I encouraged him to do so.  Conference with hospitalist related to goals of care discussion. Thank you for early referral to PMT, please consider referral upon next hospitalization.  HCPOA  NEXT OF KIN -Adam [name] states his wife, Karsyn Rochin is his Ambulance person.  He shares that he has a daughter, formerly living in [place], who recently moved nearby.   SUMMARY OF RECOMMENDATIONS   Continue full scope, full code Continue CODE STATUS discussions Continue discussions related to goals of care with multiple chronic health concerns.  Code Status/Advance Care Planning:  Full code -discussed the realities of CPR and intubation.  Adam [name] tells me he was intubated for approximately 2 days in April of this year.  He tells me that he would take life support again in the future if needed.  I asked him to consider length of time, 2 days, 2 weeks, 2  months, forever?  Symptom Management:   Per hospitalist, no additional needs at this time.  Palliative Prophylaxis:   Palliative Wound Care  Additional Recommendations (Limitations, Scope, Preferences):  Full Scope Treatment  Psycho-social/Spiritual:   Desire for further Chaplaincy support:no  Additional Recommendations: Education on Hospice  Prognosis:   Unable to determine, based on outcomes.  12 months or more would not be surprising based on relatively young age, and good functional status.  This is complicated by 16 years of hemodialysis, multiple chronic health problems.  Conversely, 6 months or less would not be surprising based on poor physical state with multiple life limiting illnesses and 4 hospitalizations, 2 ED visits in 6 months..  Discharge Planning: Home with Home Health      Primary Diagnoses: Present on Admission: . Shortness of breath   I have reviewed the medical record, interviewed the patient and family, and examined the patient. The following aspects are pertinent.  Past Medical History:  Diagnosis Date  . Abscess 02/2018   LEFT THIGH   . AICD (automatic cardioverter/defibrillator) present    [employer] - s/p extraction 08/21/17 due to staph infection  . Anemia   . Aortic stenosis    moderate AS by 08/2016 echo  . Arthritis    "knees" (08/05/2016)  . Atrial fibrillation ([place])   . ESRD (end stage renal disease) on dialysis [facility])    ESRD due to HTN started HD 2003, had renal Tx 2009 for 18 mos. Was on NX stage hemodialysis as of 2018 but in late 2019 is back at [facility] incenter HD.   Marland Kitchen Great toe amputation status    status post left hallux amputation 08/01/14  . History of blood transfusion 2009   "S/P biopsy for prostate cancer check"  . Hypothyroidism (acquired)   . Mitral stenosis    moderate mitral stenosis  . Nonhealing surgical wound    left thigh  . Nonischemic cardiomyopathy ([place])   . Pneumonia 2014; 08/05/2016  . Pulmonary HTN ([place])     . Renal insufficiency   . Thyroid cancer ([place]) 2011   Social History   Socioeconomic History  . Marital status: Married    Spouse name: Not on file  . Number of children: 1  . Years of education: Not on file  . Highest education level: Not on file  Occupational History  . Occupation: Retired Designer, industrial/product from [employer]  . Financial resource strain: Not on file  . Food insecurity:    Worry: Not on file    Inability: Not on file  .  Transportation needs:    Medical: Not on file    Non-medical: Not on file  Tobacco Use  . Smoking status: Never Smoker  . Smokeless tobacco: Never Used  Substance and Sexual Activity  . Alcohol use: Yes    Alcohol/week: 0.0 standard drinks    Comment: social  . Drug use: No  . Sexual activity: Yes  Lifestyle  . Physical activity:    Days per week: Not on file    Minutes per session: Not on file  . Stress: Not on file  Relationships  . Social connections:    Talks on phone: Not on file    Gets together: Not on file    Attends religious service: Not on file    Active member of club or organization: Not on file    Attends meetings of clubs or organizations: Not on file    Relationship status: Not on file  Other Topics Concern  . Not on file  Social History Narrative   Retired Archivist.     Family History  Problem Relation Age of Onset  . Heart failure Mother   . Hypertension Mother   . CAD Mother 10  . Emphysema Mother   . Hypertension Father   . Kidney failure Father    Scheduled Meds: . ambrisentan  10 mg Oral Daily  . amiodarone  200 mg Oral Daily  . apixaban  2.5 mg Oral BID  . atorvastatin  40 mg Oral QPM  . Chlorhexidine Gluconate Cloth  6 each Topical Q0600  . Chlorhexidine Gluconate Cloth  6 each Topical Q0600  . cinacalcet  90 mg Oral QPM  . feeding supplement (NEPRO CARB STEADY)  237 mL Oral BID BM  . feeding supplement (PRO-STAT SUGAR FREE 64)  30 mL Oral TID  . levothyroxine  300  mcg Oral Q24H  . midodrine  10 mg Oral TID WC  . multivitamin  1 tablet Oral QHS  . nutrition supplement (JUVEN)  1 packet Oral BID BM  . nystatin  1 g Topical Daily  . pantoprazole  40 mg Oral BID  . vitamin C  250 mg Oral BID   Continuous Infusions: PRN Meds:.acetaminophen **OR** acetaminophen, albuterol, traMADol Medications Prior to Admission:  Prior to Admission medications   Medication Sig Start Date End Date Taking? Authorizing Provider  albuterol (PROVENTIL) (2.5 MG/3ML) 0.083% nebulizer solution Take 3 mLs (2.5 mg total) by nebulization every 6 (six) hours as needed for wheezing or shortness of breath. 03/17/18  Yes Lauraine Rinne, NP  ambrisentan (LETAIRIS) 10 MG tablet Take 1 tablet (10 mg total) by mouth daily. 09/11/17  Yes Juanito Doom, MD  amiodarone (PACERONE) 200 MG tablet Take 200 mg by mouth daily.    Yes [provider]  apixaban (ELIQUIS) 2.5 MG TABS tablet Take 1 tablet (2.5 mg total) by mouth 2 (two) times daily. 01/29/18  Yes Ulyses Amor, PA-C  atorvastatin (LIPITOR) 40 MG tablet Take 1 tablet (40 mg total) by mouth every evening. 12/11/17  Yes Diallo, Abdoulaye, MD  cinacalcet (SENSIPAR) 90 MG tablet Take 90 mg by mouth every evening.   Yes [provider]  levothyroxine (SYNTHROID, LEVOTHROID) 300 MCG tablet Take 1 tablet (300 mcg total) by mouth daily before breakfast. 09/26/17  Yes Enid Derry, [place], DO  midodrine (PROAMATINE) 10 MG tablet TAKE 1 TABLET (10 MG TOTAL) BY MOUTH 3 (THREE) TIMES DAILY. 10/08/17  Yes Minus Breeding, MD  multivitamin (RENA-VIT) TABS tablet TAKE 1 TABLET  BY MOUTH EVERYDAY AT BEDTIME Patient taking differently: Take 1 tablet by mouth at bedtime.  01/12/18  Yes Diallo, Abdoulaye, MD  nystatin (MYCOSTATIN/NYSTOP) powder Apply topically 4 (four) times daily. Patient taking differently: Apply 1 g topically daily.  11/18/17  Yes Diallo, Abdoulaye, MD  pantoprazole (PROTONIX) 40 MG tablet Take 1 tablet (40 mg total) by mouth 2  (two) times daily. 02/26/18 02/26/19 Yes Guadalupe Dawn, MD  tadalafil (ADCIRCA/CIALIS) 20 MG tablet Take 20 mg by mouth daily.  10/06/17  Yes [provider]  traMADol (ULTRAM) 50 MG tablet Take 1 tablet (50 mg total) by mouth every 6 (six) hours as needed. 03/23/18 03/23/19 Yes Marty Heck, MD   Allergies  Allergen Reactions  . Enalapril Swelling and Other (See Comments)    MOUTH SWELLING EDEMA  . Iodinated Diagnostic Agents Nausea And Vomiting and Nausea Only   Review of Systems  Unable to perform ROS: Other    Physical Exam  Constitutional: He is oriented to person, place, and time. No distress.  Appears acutely/chronically ill, somewhat frail and thin  HENT:  Head: Atraumatic.  Cardiovascular: Normal rate.  Pulmonary/Chest: Effort normal. No respiratory distress.  Abdominal: Soft. He exhibits no distension.  Neurological: He is alert and oriented to person, place, and time.  Skin: Skin is warm and dry.  Multiple wounds in multiple sites in various stages of healing,  Psychiatric:  Makes and mostly keeps eye contact, calm and cooperative  Nursing note and vitals reviewed.   Vital Signs: BP (!) (P) 78/55   Pulse (P) 67   Temp (!) 97.5 F (36.4 C) (Oral)   Resp 16   Ht 6\' 1"  (1.854 m)   Wt 113.8 kg Comment: stood to s cale   SpO2 93%   BMI 33.10 kg/m  Pain Scale: 0-10 POSS *See Group Information*: 1-Acceptable,Awake and alert Pain Score: 0-No pain   SpO2: SpO2: 93 % O2 Device:SpO2: 93 % O2 Flow Rate: .   IO: Intake/output summary:   Intake/Output Summary (Last 24 hours) at 03/29/2018 1026 Last data filed at 03/29/2018 0446 Gross per 24 hour  Intake 1823.5 ml  Output 0 ml  Net 1823.5 ml    LBM: Last BM Date: 03/27/18 Baseline Weight: Weight: 118.6 kg Most recent weight: Weight: 113.8 kg(stood to s cale )     Palliative Assessment/Data:   Flowsheet Rows     Most Recent Value  Intake Tab  Referral Department  Hospitalist  Unit at  Time of Referral  Cardiac/Telemetry Unit  Palliative Care Primary Diagnosis  Nephrology  Date Notified  03/28/18  Palliative Care Type  New Palliative care  Reason for referral  Clarify Goals of Care  Date of Admission  03/25/18  Date first seen by Palliative Care  03/29/18  # of days Palliative referral response time  1 Day(s)  # of days IP prior to Palliative referral  3  Clinical Assessment  Psychosocial & Spiritual Assessment  Palliative Care Outcomes      Time In: 1310 Time Out: 1420 Time Total: 70 minutes Greater than 50%  of this time was spent counseling and coordinating care related to the above assessment and plan.  Signed by: Drue Novel, NP   Please contact Palliative Medicine Team phone at [phone] for questions and concerns.  For individual provider: See Shea Evans

## 2018-03-29 NOTE — Care Management Note (Signed)
Case Management Note  Patient Details  Name: Adam [name]. MRN: [id number] Date of Birth: August 16, 1960  Subjective/Objective:     Pt admitted with HF                 Action/Plan:  PTA independent from home with wife.  Pt is on home HD.   Pt is active with [facility] for [facility] for both wound care and HD - CM requested resumption services and [facility] aware of admit.  Pt/wife declined HHPT.   Pt has PCP and declined barriers with paying for medications.   Expected Discharge Date:  03/29/18               Expected Discharge Plan:  Home/Self Care  In-House Referral:     Discharge planning Services  CM Consult  Post Acute Care Choice:    Choice offered to:  Patient  DME Arranged:    DME Agency:     HH Arranged:  RN [place] Agency:  [place]  Status of Service:  Completed, signed off  If discussed at [place] of Stay Meetings, dates discussed:    Additional Comments:  Maryclare Labrador, RN 03/29/2018, 4:32 PM

## 2018-03-29 NOTE — Procedures (Signed)
Patient seen and examined on Hemodialysis.  Feeling better after pRBCs.   Eager for d/c today.  QB 400 via LUE HeRO graft UF goal 3.5L  Treatment adjusted as needed.  Madelon Lips MD [facility] pgr [phone] 9:30 AM

## 2018-03-29 NOTE — Progress Notes (Signed)
  [facility] Progress Note   Assessment/ Plan:   Dialysis Orders: Home NxStage MTThF BFR 450 EDW 119kg  LUE HeRO No Hep  Assessment/Plan: 1. Fluid overload 2/2 loss of body weight in ESRD patient. S/p serial dialysis with downward adjustment of EDW.  OK for d/c after HD today from our perspective.    2. ESRD - MTThF, off sched this week. Home HD. 3. Hypotension/volume - Chronic hypotension. Keep SBP >70 on HD. On midodrine for BP support. HD with healthy UF goal. 4. Anemia - Hgb 7.1. Mircera 225 dosed 11/13 at outpatient center. Follow trends. S/p pRBCs yesterday 11/17 and Hgb up to 8.6. 5. Afib - on amio/Eliquis 6. NICM 7. Pulm HTN- severe, followed by cardiology, agree with pall care consult. 8. Dispo: OK from renal perspective to d/c after HD today 11/18.  Subjective:    S/p pRBC transfusion yesterday.  Feeling "better".  Under EDW.     Objective:   BP (!) 82/36   Pulse 75   Temp (!) 97.5 F (36.4 C) (Oral)   Resp 16   Ht 6\' 1"  (1.854 m)   Wt 113.8 kg Comment: stood to s cale   SpO2 93%   BMI 33.10 kg/m   Physical Exam: GEN: NAD Head:NCAT sclera not icteric MMM Neck: Supple.JVD improved to mid-neck Lungs:clear anteriorly  Heart:RRR soft systolic murmur Abdomen: some dependent edema Lower extremities:2+ LE edema Dialysis Access:LUE HeRO graft +bruit  Labs: BMET Recent Labs  Lab 03/25/18 2021 03/26/18 0556 03/27/18 0522 03/28/18 0454 03/29/18 0530  NA 134* 135 135 136 133*  K 3.8 3.7 3.9 3.4* 3.7  CL 93* 94* 96* 96* 95*  CO2 28 26 27 29 25   GLUCOSE 61* 56* 56* 63* 67*  BUN 25* 27* 20 13 22*  CREATININE 5.36* 5.97* 4.37* 3.66* 4.79*  CALCIUM 8.8* 8.6* 8.2* 7.9* 7.5*  PHOS  --   --  2.3*  --  2.3*   CBC Recent Labs  Lab 03/27/18 0522 03/27/18 1053 03/28/18 0454 03/28/18 2156 03/29/18 0530  WBC 5.5 6.8 5.7  --  6.0  NEUTROABS  --   --  4.4  --  4.6  HGB 6.9* 7.5* 7.1* 8.2* 8.6*  HCT 22.8* 24.8* 23.5* 27.8* 28.5*   MCV 89.8 90.8 90.4  --  90.8  PLT 116* 112* 113*  --  105*    @IMGRELPRIORS @ Medications:    . ambrisentan  10 mg Oral Daily  . amiodarone  200 mg Oral Daily  . apixaban  2.5 mg Oral BID  . atorvastatin  40 mg Oral QPM  . Chlorhexidine Gluconate Cloth  6 each Topical Q0600  . Chlorhexidine Gluconate Cloth  6 each Topical Q0600  . cinacalcet  90 mg Oral QPM  . feeding supplement (NEPRO CARB STEADY)  237 mL Oral BID BM  . feeding supplement (PRO-STAT SUGAR FREE 64)  30 mL Oral TID  . levothyroxine  300 mcg Oral Q24H  . midodrine  10 mg Oral TID WC  . multivitamin  1 tablet Oral QHS  . nutrition supplement (JUVEN)  1 packet Oral BID BM  . nystatin  1 g Topical Daily  . pantoprazole  40 mg Oral BID  . sodium hypochlorite   Irrigation BID  . vitamin C  250 mg Oral BID     Madelon Lips, MD [facility] pgr [phone] 03/29/2018, 9:24 AM

## 2018-03-30 DIAGNOSIS — I9589 Other hypotension: Secondary | ICD-10-CM | POA: Diagnosis not present

## 2018-03-30 DIAGNOSIS — I080 Rheumatic disorders of both mitral and aortic valves: Secondary | ICD-10-CM | POA: Diagnosis not present

## 2018-03-30 DIAGNOSIS — N186 End stage renal disease: Secondary | ICD-10-CM | POA: Diagnosis not present

## 2018-03-30 DIAGNOSIS — I480 Paroxysmal atrial fibrillation: Secondary | ICD-10-CM | POA: Diagnosis not present

## 2018-03-30 DIAGNOSIS — I255 Ischemic cardiomyopathy: Secondary | ICD-10-CM | POA: Diagnosis not present

## 2018-03-30 DIAGNOSIS — I700 Atherosclerosis of aorta: Secondary | ICD-10-CM | POA: Diagnosis not present

## 2018-03-30 DIAGNOSIS — D631 Anemia in chronic kidney disease: Secondary | ICD-10-CM | POA: Diagnosis not present

## 2018-03-30 DIAGNOSIS — T8189XD Other complications of procedures, not elsewhere classified, subsequent encounter: Secondary | ICD-10-CM | POA: Diagnosis not present

## 2018-03-30 DIAGNOSIS — I120 Hypertensive chronic kidney disease with stage 5 chronic kidney disease or end stage renal disease: Secondary | ICD-10-CM | POA: Diagnosis not present

## 2018-03-30 NOTE — Telephone Encounter (Signed)
yes

## 2018-03-30 NOTE — Telephone Encounter (Signed)
Noted. Patient is aware. Nothing further needed.

## 2018-03-31 ENCOUNTER — Telehealth: Payer: Self-pay | Admitting: Family Medicine

## 2018-03-31 ENCOUNTER — Ambulatory Visit: Payer: Medicare HMO | Admitting: Pulmonary Disease

## 2018-03-31 ENCOUNTER — Encounter: Payer: Self-pay | Admitting: Pulmonary Disease

## 2018-03-31 VITALS — BP 78/48 | HR 93 | Temp 97.3°F | Ht 73.0 in | Wt 241.0 lb

## 2018-03-31 DIAGNOSIS — I272 Pulmonary hypertension, unspecified: Secondary | ICD-10-CM

## 2018-03-31 DIAGNOSIS — I5023 Acute on chronic systolic (congestive) heart failure: Secondary | ICD-10-CM

## 2018-03-31 DIAGNOSIS — N186 End stage renal disease: Secondary | ICD-10-CM | POA: Diagnosis not present

## 2018-03-31 DIAGNOSIS — Z992 Dependence on renal dialysis: Secondary | ICD-10-CM | POA: Diagnosis not present

## 2018-03-31 NOTE — Telephone Encounter (Signed)
Amy a RN with [facility] is calling and needs Verbal Orders for skilled nursing [facility] and wound care. 2 x for 1 week and then 1x a week for 5 weeks.  The best call back number is [phone].

## 2018-03-31 NOTE — Assessment & Plan Note (Signed)
Keep follow-up with nephrology >>> Continue Monday, Tuesday, Thursday, Friday dialysis  If you start having symptoms of low blood pressure >>>Such as dizziness, lightheadedness, blurred vision >>> On dialysis days (Monday, Tuesday, Thursday, Friday) you can skip your Adcirca dose >>> If you remain asymptomatic as you are today then we will continue daily Adcirca and Letairis as instructed

## 2018-03-31 NOTE — Assessment & Plan Note (Signed)
If you start having symptoms of low blood pressure >>>Such as dizziness, lightheadedness, blurred vision >>> On dialysis days (Monday, Tuesday, Thursday, Friday) you can skip your Adcirca dose >>> If you remain asymptomatic as you are today then we will continue daily Adcirca and Letairis as instructed  I will discuss with Dr. Anastasia Pall nurse Burman Nieves about getting on a callback list so if he has a cancellation he can see him sooner  We will cancel your December PFT  Keep follow-up with me in December and let us were able to get you into see Dr. [place] Bells sooner  Keep follow-up with Dr. [place] Bells in January and thus were able to get you into see Dr. [place] Bells sooner

## 2018-03-31 NOTE — Progress Notes (Signed)
Reviewed, agree 

## 2018-03-31 NOTE — Progress Notes (Signed)
@Patient  ID: Adam Alm., male    DOB: 1960-07-20, 57 y.o.   MRN: [id number]  Chief Complaint  Patient presents with  . Follow-up    2-week follow-up, pulmonary hypertension, recent hospitalization due to fluid overload    Referring provider: [name], MD  HPI:  57 year old male patient followed in our office for pulmonary hypertension (diagnosed in 2012).   PMH: Hypertensive glomerular nephritis, kidney transplant 2000, kidney transplant failed in 2009, patient is Monday Tuesday, Thursday Friday dialysis, A. fib Smoker/ Smoking History: Never Smoker  Maintenance: Letairis, Adcirca Pt of: Dr. Lake Bells  03/31/2018  - Visit   Pleasant 57 year old never smoker followed in our office for pulmonary hypertension presents today for 2-week follow-up visit.  Patient reported that he was recently discharged from the hospital where he was treated for fluid overload.  Weight is down 30 pounds since last office visit.  Patient reports his breathing has significantly improved.  Patient continues to have some fatigue but is able to walk and move around without severe dyspnea which he was having prior to being hospitalized.  Patient reports that he seen renal today and they have significantly adjusted his dry weight in the hospital.  He is waiting to see if renal will keep his dry weight this low outpatient wise.  Patient continues to do Monday, Tuesday, Thursday, Friday dialysis.  Patient will follow-up with cardiology on Monday.  Patient continues to have healing left leg wound that is healing and less edematous after last hospitalization.  Patient remains adherent to Lateris and Adcirca daily.  Patient does have low blood pressure today in office but does not have any symptoms of dizziness, blurred vision, lightheadedness.     Tests:   02/19/2018-echocardiogram-LV ejection fraction 30 to 35%, diffuse hypokinesis, grade 1 diastolic dysfunction PAP pressure 61  >>>this is a  significant change from April/2019 echocardiogram which had an LV ejection fraction of 60 to 65%  >>> Per chart review cardiologist [facility] reviewed echo discrete with read, cardiologist believes that LV ejection fraction is around 40 to 45%  02/16/2018-CT Angio - no evidence of PE, mild interstitial pulmonary edema, small right pleural effusion and enlarged heart, small pericardial effusion  02/11/2018- vascular lower extremity Doppler normal  6MW March 2017 6 minute walk test at [facility] 385 m, O2 saturation 100% on room air  Echo: March 2017 echocardiogram [facility] RVSP 40 mmHg, LVEF 55% March 2018 echo [facility]: RVSP 76mm Hg 08/2017 Echo RVSP 32mmHg  RHC April 2017 right heart catheterization [facility] cardiac index 3.7, right atrial pressure 5, RVSP 46, mean PA pressure 31, pulmonary capillary wedge pressure 11, pulmonary vascular resistance 2.2 FENO:  No results found for: NITRICOXIDE  PFT: No flowsheet data found.  Imaging: Dg Chest 2 View  Result Date: 03/25/2018 CLINICAL DATA:  Shortness of breath and chest pain EXAM: CHEST - 2 VIEW COMPARISON:  02/18/2018 FINDINGS: Hero graft is again identified on the left. Prior venous stenting is noted as well. The lungs are clear bilaterally. The cardiac shadow is again enlarged but stable. Aortic calcifications are again seen. Mild vascular congestion is again noted. No bony abnormality is seen. IMPRESSION: Mild vascular congestion consistent with mild fluid overload related to end-stage renal disease. No new focal abnormality is seen. Electronically Signed   By: Inez Catalina M.D.   On: 03/25/2018 21:26    Chart Review:    Specialty Problems      Pulmonary Problems   Sinus congestion   Epistaxis  02/2016 ENT assessment> perforated nasal septum      HCAP (healthcare-associated pneumonia)   Acute respiratory failure with hypoxia (HCC)   Shortness of breath   Acute pulmonary edema (HCC)      Allergies  Allergen  Reactions  . Enalapril Swelling and Other (See Comments)    MOUTH SWELLING EDEMA  . Iodinated Diagnostic Agents Nausea And Vomiting and Nausea Only    Immunization History  Administered Date(s) Administered  . Influenza Split 01/30/2014, 02/19/2016, 12/27/2016  . Influenza Whole 02/10/2015  . Influenza-Unspecified 01/12/2017, 02/08/2018  . Pneumococcal Polysaccharide-23 05/29/2014    Past Medical History:  Diagnosis Date  . Abscess 02/2018   LEFT THIGH   . AICD (automatic cardioverter/defibrillator) present    [employer] - s/p extraction 08/21/17 due to staph infection  . Anemia   . Aortic stenosis    moderate AS by 08/2016 echo  . Arthritis    "knees" (08/05/2016)  . Atrial fibrillation ([place])   . ESRD (end stage renal disease) on dialysis [facility])    ESRD due to HTN started HD 2003, had renal Tx 2009 for 18 mos. Was on NX stage hemodialysis as of 2018 but in late 2019 is back at [facility]center HD.   Marland Kitchen Great toe amputation status    status post left hallux amputation 08/01/14  . History of blood transfusion 2009   "S/P biopsy for prostate cancer check"  . Hypothyroidism (acquired)   . Mitral stenosis    moderate mitral stenosis  . Nonhealing surgical wound    left thigh  . Nonischemic cardiomyopathy ([place])   . Pneumonia 2014; 08/05/2016  . Pulmonary HTN ([place])   . Renal insufficiency   . Thyroid cancer ([place]) 2011    Tobacco History: Social History   Tobacco Use  Smoking Status Never Smoker  Smokeless Tobacco Never Used   Counseling given: Yes  Continue to not smoke  Outpatient Encounter Medications as of 03/31/2018  Medication Sig  . albuterol (PROVENTIL) (2.5 MG/3ML) 0.083% nebulizer solution Take 3 mLs (2.5 mg total) by nebulization every 6 (six) hours as needed for wheezing or shortness of breath.  Marland Kitchen ambrisentan (LETAIRIS) 10 MG tablet Take 1 tablet (10 mg total) by mouth daily.  Marland Kitchen amiodarone (PACERONE) 200 MG tablet Take 200 mg by mouth daily.   Marland Kitchen apixaban  (ELIQUIS) 2.5 MG TABS tablet Take 1 tablet (2.5 mg total) by mouth 2 (two) times daily.  Marland Kitchen atorvastatin (LIPITOR) 40 MG tablet Take 1 tablet (40 mg total) by mouth every evening.  . cinacalcet (SENSIPAR) 90 MG tablet Take 90 mg by mouth every evening.  Marland Kitchen levothyroxine (SYNTHROID, LEVOTHROID) 300 MCG tablet Take 1 tablet (300 mcg total) by mouth daily before breakfast.  . midodrine (PROAMATINE) 10 MG tablet TAKE 1 TABLET (10 MG TOTAL) BY MOUTH 3 (THREE) TIMES DAILY.  . multivitamin (RENA-VIT) TABS tablet TAKE 1 TABLET BY MOUTH EVERYDAY AT BEDTIME (Patient taking differently: Take 1 tablet by mouth at bedtime. )  . nystatin (MYCOSTATIN/NYSTOP) powder Apply topically 4 (four) times daily. (Patient taking differently: Apply 1 g topically daily. )  . pantoprazole (PROTONIX) 40 MG tablet Take 1 tablet (40 mg total) by mouth 2 (two) times daily.  . tadalafil (ADCIRCA/CIALIS) 20 MG tablet Take 20 mg by mouth daily.   . traMADol (ULTRAM) 50 MG tablet Take 1 tablet (50 mg total) by mouth every 6 (six) hours as needed.   No facility-administered encounter medications on file as of 03/31/2018.     Review of  Systems  Review of Systems  Constitutional: Negative for activity change, chills, fever and unexpected weight change.  HENT: Negative for postnasal drip, rhinorrhea, sinus pressure, sinus pain, sneezing and sore throat.   Eyes: Negative.   Respiratory: Positive for shortness of breath (baseline improved significantly ). Negative for cough and wheezing.   Cardiovascular: Negative for chest pain and palpitations.  Gastrointestinal: Negative for constipation, diarrhea, nausea and vomiting.  Endocrine: Negative.   Genitourinary:       Continues Monday, Tuesday, Thursday, Friday dialysis  Musculoskeletal: Negative.   Skin: Negative.   Neurological: Negative for dizziness, syncope, weakness, light-headedness, numbness and headaches.  Psychiatric/Behavioral: Negative.  Negative for dysphoric mood. The  patient is not nervous/anxious.   All other systems reviewed and are negative.    Physical Exam  BP (!) 78/48 (BP Location: Right Arm, Cuff Size: Normal)   Pulse 93   Temp (!) 97.3 F (36.3 C) (Oral)   Ht 6\' 1"  (1.854 m)   Wt 241 lb (109.3 kg)   SpO2 98%   BMI 31.80 kg/m   Wt Readings from Last 5 Encounters:  03/31/18 241 lb (109.3 kg)  03/29/18 242 lb 8.1 oz (110 kg)  03/23/18 271 lb (122.9 kg)  03/17/18 275 lb (124.7 kg)  03/08/18 266 lb (120.7 kg)   Discussed blood pressure with patient.  We can continue to monitor at this time.  Patient is asymptomatic with hypotension today.  Discussed weight loss after hospitalization.  Physical Exam  Constitutional: He is oriented to person, place, and time and well-developed, well-nourished, and in no distress. No distress.  HENT:  Head: Normocephalic and atraumatic.  Right Ear: Hearing and external ear normal.  Left Ear: Hearing and external ear normal.  Nose: Nose normal. Right sinus exhibits no maxillary sinus tenderness and no frontal sinus tenderness. Left sinus exhibits no maxillary sinus tenderness and no frontal sinus tenderness.  Mouth/Throat: Uvula is midline and oropharynx is clear and moist. No oropharyngeal exudate.  + Canals are occluded with cerumen bilaterally  Eyes: Pupils are equal, round, and reactive to light.  Neck: Normal range of motion. Neck supple.  Cardiovascular: Normal rate, regular rhythm and normal heart sounds.  Pulmonary/Chest: Effort normal and breath sounds normal. No accessory muscle usage. No respiratory distress. He has no decreased breath sounds. He has no wheezes. He has no rhonchi.  Abdominal: Soft. Bowel sounds are normal. He exhibits distension. There is no tenderness.  Musculoskeletal: Normal range of motion. He exhibits edema (trace le edema).  Lymphadenopathy:    He has no cervical adenopathy.  Neurological: He is alert and oriented to person, place, and time. Gait normal.  Skin: Skin  is warm and dry. He is not diaphoretic. No erythema.  Psychiatric: Mood, memory, affect and judgment normal.  Nursing note and vitals reviewed.     Lab Results:  CBC    Component Value Date/Time   WBC 6.0 03/29/2018 0530   RBC 3.14 (L) 03/29/2018 0530   HGB 8.6 (L) 03/29/2018 0530   HGB 8.8 (L) 02/10/2018 1556   HCT 28.5 (L) 03/29/2018 0530   HCT 28.2 (L) 02/10/2018 1556   PLT 105 (L) 03/29/2018 0530   PLT 122 (L) 02/10/2018 1556   MCV 90.8 03/29/2018 0530   MCV 87 02/10/2018 1556   MCH 27.4 03/29/2018 0530   MCHC 30.2 03/29/2018 0530   RDW 20.2 (H) 03/29/2018 0530   RDW 17.7 (H) 02/10/2018 1556   LYMPHSABS 0.7 03/29/2018 0530   LYMPHSABS 0.5 (L)  02/10/2018 1556   MONOABS 0.6 03/29/2018 0530   EOSABS 0.1 03/29/2018 0530   EOSABS 0.1 02/10/2018 1556   BASOSABS 0.0 03/29/2018 0530   BASOSABS 0.0 02/10/2018 1556    BMET    Component Value Date/Time   NA 133 (L) 03/29/2018 0530   K 3.7 03/29/2018 0530   CL 95 (L) 03/29/2018 0530   CO2 25 03/29/2018 0530   GLUCOSE 67 (L) 03/29/2018 0530   BUN 22 (H) 03/29/2018 0530   CREATININE 4.79 (H) 03/29/2018 0530   CREATININE 4.91 (H) 08/13/2015 1223   CALCIUM 7.5 (L) 03/29/2018 0530   GFRNONAA 12 (L) 03/29/2018 0530   GFRNONAA 13 (L) 07/27/2014 1539   GFRAA 14 (L) 03/29/2018 0530   GFRAA 15 (L) 07/27/2014 1539    BNP No results found for: BNP  ProBNP No results found for: PROBNP    Assessment & Plan:   Pleasant 57 year old male patient completing follow-up with our office today.  I discussed his case with Dr. Lake Bells.  We will have patient continue on Letairis as well as Adcirca.  If patient starts to become symptomatic due to hypotension then he can skip Adcirca dose on dialysis days (Monday, Tuesday, Thursday, Friday).  If patient remains asymptomatic then will continue Letairis and Adcirca as prescribed.  We will cancel December PFT.  We will have patient try to follow-up sooner to see Dr. Lake Bells.   Pulmonary  hypertension ([place]) If you start having symptoms of low blood pressure >>>Such as dizziness, lightheadedness, blurred vision >>> On dialysis days (Monday, Tuesday, Thursday, Friday) you can skip your Adcirca dose >>> If you remain asymptomatic as you are today then we will continue daily Adcirca and Letairis as instructed  I will discuss with Dr. Anastasia Pall nurse Burman Nieves about getting on a callback list so if he has a cancellation he can see him sooner  We will cancel your December PFT  Keep follow-up with me in December and let us were able to get you into see Dr. Lake Bells sooner  Keep follow-up with Dr. Lake Bells in January and thus were able to get you into see Dr. Lake Bells sooner   Acute on chronic systolic CHF (congestive heart failure) [facility]) Keep follow-up with cardiology  Stage 5 chronic kidney disease on chronic dialysis [facility]) Keep follow-up with nephrology >>> Continue Monday, Tuesday, Thursday, Friday dialysis  If you start having symptoms of low blood pressure >>>Such as dizziness, lightheadedness, blurred vision >>> On dialysis days (Monday, Tuesday, Thursday, Friday) you can skip your Adcirca dose >>> If you remain asymptomatic as you are today then we will continue daily Adcirca and Letairis as instructed   This appointment was 32 minutes along with over 50% of the time in direct face-to-face patient care, assessment, plan of care follow-up.   Lauraine Rinne, NP 03/31/2018

## 2018-03-31 NOTE — Patient Instructions (Signed)
Keep follow-up with nephrology >>> Continue Monday, Tuesday, Thursday, Friday dialysis  Keep follow-up with cardiology  If you start having symptoms of low blood pressure >>>Such as dizziness, lightheadedness, blurred vision >>> On dialysis days (Monday, Tuesday, Thursday, Friday) you can skip your Adcirca dose >>> If you remain asymptomatic as you are today then we will continue daily Adcirca and Letairis as instructed  I will discuss with Dr. Anastasia Pall nurse Burman Nieves about getting on a callback list so if he has a cancellation he can see him sooner  We will cancel your December PFT  Keep follow-up with me in December and let us were able to get you into see Dr. Lake Bells sooner  Keep follow-up with Dr. Lake Bells in January and thus were able to get you into see Dr. Lake Bells sooner      It is flu season:   >>>Remember to be washing your hands regularly, using hand sanitizer, be careful to use around herself with has contact with people who are sick will increase her chances of getting sick yourself. >>> Best ways to protect herself from the flu: Receive the yearly flu vaccine, practice good hand hygiene washing with soap and also using hand sanitizer when available, eat a nutritious meals, get adequate rest, hydrate appropriately   Please contact the office if your symptoms worsen or you have concerns that you are not improving.   Thank you for choosing [facility] for your healthcare, and for allowing [place] to partner with you on your healthcare journey. I am thankful to be able to provide care to you today.   Wyn Quaker FNP-C

## 2018-03-31 NOTE — Assessment & Plan Note (Signed)
Keep follow up with cardiology

## 2018-04-01 DIAGNOSIS — I480 Paroxysmal atrial fibrillation: Secondary | ICD-10-CM | POA: Diagnosis not present

## 2018-04-01 DIAGNOSIS — I120 Hypertensive chronic kidney disease with stage 5 chronic kidney disease or end stage renal disease: Secondary | ICD-10-CM | POA: Diagnosis not present

## 2018-04-01 DIAGNOSIS — T8189XD Other complications of procedures, not elsewhere classified, subsequent encounter: Secondary | ICD-10-CM | POA: Diagnosis not present

## 2018-04-01 DIAGNOSIS — I080 Rheumatic disorders of both mitral and aortic valves: Secondary | ICD-10-CM | POA: Diagnosis not present

## 2018-04-01 DIAGNOSIS — D631 Anemia in chronic kidney disease: Secondary | ICD-10-CM | POA: Diagnosis not present

## 2018-04-01 DIAGNOSIS — I255 Ischemic cardiomyopathy: Secondary | ICD-10-CM | POA: Diagnosis not present

## 2018-04-01 DIAGNOSIS — N186 End stage renal disease: Secondary | ICD-10-CM | POA: Diagnosis not present

## 2018-04-01 DIAGNOSIS — I700 Atherosclerosis of aorta: Secondary | ICD-10-CM | POA: Diagnosis not present

## 2018-04-01 DIAGNOSIS — I9589 Other hypotension: Secondary | ICD-10-CM | POA: Diagnosis not present

## 2018-04-02 DIAGNOSIS — N186 End stage renal disease: Secondary | ICD-10-CM | POA: Diagnosis not present

## 2018-04-02 DIAGNOSIS — D631 Anemia in chronic kidney disease: Secondary | ICD-10-CM | POA: Diagnosis not present

## 2018-04-02 DIAGNOSIS — D509 Iron deficiency anemia, unspecified: Secondary | ICD-10-CM | POA: Diagnosis not present

## 2018-04-02 DIAGNOSIS — N2581 Secondary hyperparathyroidism of renal origin: Secondary | ICD-10-CM | POA: Diagnosis not present

## 2018-04-03 DIAGNOSIS — A419 Sepsis, unspecified organism: Secondary | ICD-10-CM | POA: Diagnosis not present

## 2018-04-03 DIAGNOSIS — J9601 Acute respiratory failure with hypoxia: Secondary | ICD-10-CM | POA: Diagnosis not present

## 2018-04-03 DIAGNOSIS — T8130XA Disruption of wound, unspecified, initial encounter: Secondary | ICD-10-CM | POA: Diagnosis not present

## 2018-04-03 DIAGNOSIS — N186 End stage renal disease: Secondary | ICD-10-CM | POA: Diagnosis not present

## 2018-04-03 DIAGNOSIS — R7881 Bacteremia: Secondary | ICD-10-CM | POA: Diagnosis not present

## 2018-04-03 DIAGNOSIS — T8189XA Other complications of procedures, not elsewhere classified, initial encounter: Secondary | ICD-10-CM | POA: Diagnosis not present

## 2018-04-03 DIAGNOSIS — Z95810 Presence of automatic (implantable) cardiac defibrillator: Secondary | ICD-10-CM | POA: Diagnosis not present

## 2018-04-03 DIAGNOSIS — T829XXA Unspecified complication of cardiac and vascular prosthetic device, implant and graft, initial encounter: Secondary | ICD-10-CM | POA: Diagnosis not present

## 2018-04-05 ENCOUNTER — Ambulatory Visit: Payer: Medicare HMO | Admitting: Cardiovascular Disease

## 2018-04-06 DIAGNOSIS — I255 Ischemic cardiomyopathy: Secondary | ICD-10-CM | POA: Diagnosis not present

## 2018-04-06 DIAGNOSIS — I700 Atherosclerosis of aorta: Secondary | ICD-10-CM | POA: Diagnosis not present

## 2018-04-06 DIAGNOSIS — I480 Paroxysmal atrial fibrillation: Secondary | ICD-10-CM | POA: Diagnosis not present

## 2018-04-06 DIAGNOSIS — I080 Rheumatic disorders of both mitral and aortic valves: Secondary | ICD-10-CM | POA: Diagnosis not present

## 2018-04-06 DIAGNOSIS — T8189XD Other complications of procedures, not elsewhere classified, subsequent encounter: Secondary | ICD-10-CM | POA: Diagnosis not present

## 2018-04-06 DIAGNOSIS — I9589 Other hypotension: Secondary | ICD-10-CM | POA: Diagnosis not present

## 2018-04-06 DIAGNOSIS — I120 Hypertensive chronic kidney disease with stage 5 chronic kidney disease or end stage renal disease: Secondary | ICD-10-CM | POA: Diagnosis not present

## 2018-04-06 DIAGNOSIS — D631 Anemia in chronic kidney disease: Secondary | ICD-10-CM | POA: Diagnosis not present

## 2018-04-06 DIAGNOSIS — N186 End stage renal disease: Secondary | ICD-10-CM | POA: Diagnosis not present

## 2018-04-07 ENCOUNTER — Ambulatory Visit: Payer: Medicare HMO | Admitting: Family Medicine

## 2018-04-07 DIAGNOSIS — I871 Compression of vein: Secondary | ICD-10-CM | POA: Diagnosis not present

## 2018-04-07 DIAGNOSIS — T8189XA Other complications of procedures, not elsewhere classified, initial encounter: Secondary | ICD-10-CM | POA: Diagnosis not present

## 2018-04-07 DIAGNOSIS — T82858A Stenosis of vascular prosthetic devices, implants and grafts, initial encounter: Secondary | ICD-10-CM | POA: Diagnosis not present

## 2018-04-07 DIAGNOSIS — N186 End stage renal disease: Secondary | ICD-10-CM | POA: Diagnosis not present

## 2018-04-07 DIAGNOSIS — Z992 Dependence on renal dialysis: Secondary | ICD-10-CM | POA: Diagnosis not present

## 2018-04-09 ENCOUNTER — Ambulatory Visit: Payer: Medicare Other | Admitting: Endocrinology

## 2018-04-10 DIAGNOSIS — N186 End stage renal disease: Secondary | ICD-10-CM | POA: Diagnosis not present

## 2018-04-10 DIAGNOSIS — E1129 Type 2 diabetes mellitus with other diabetic kidney complication: Secondary | ICD-10-CM | POA: Diagnosis not present

## 2018-04-10 DIAGNOSIS — Z992 Dependence on renal dialysis: Secondary | ICD-10-CM | POA: Diagnosis not present

## 2018-04-10 IMAGING — US US THYROID
1 series · 14 of 25 positions shown · non-contrast
Comparison: 08/20/2016, 07/24/2016

CLINICAL DATA: Thyroid cancer, status post thyroidectomy previous
left thyroid bed hypoechoic nodule wall ultrasound biopsy 08/20/2016

EXAM:
THYROID ULTRASOUND
TECHNIQUE: Ultrasound examination of the thyroid gland and adjacent soft
tissues was performed.

[Series 1: us thyroid · 0.04mm/px · 14 of 29 slices shown]
[im 1/29]
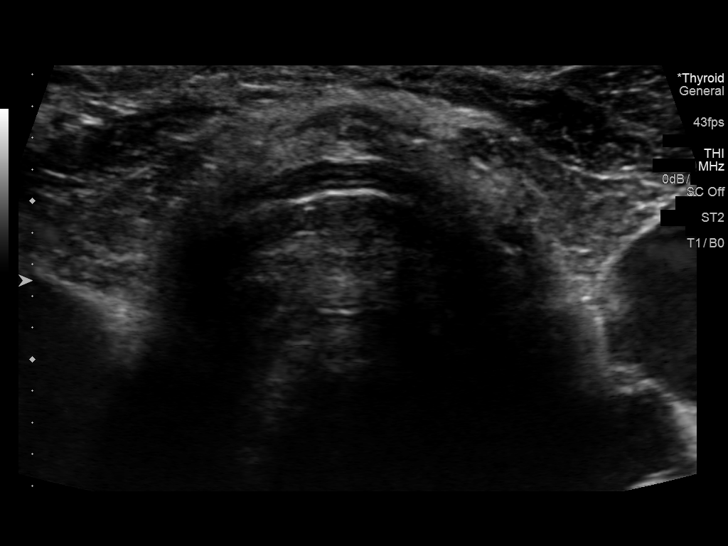
[im 3/29]
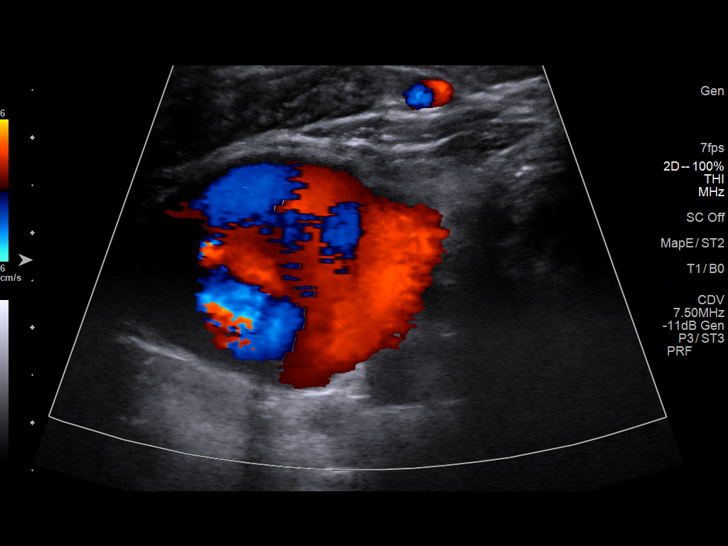
[im 5/29]
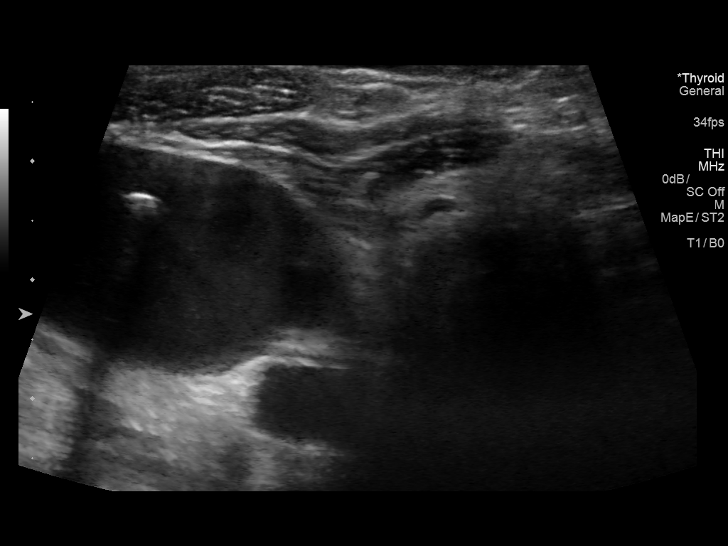
[im 8/29]
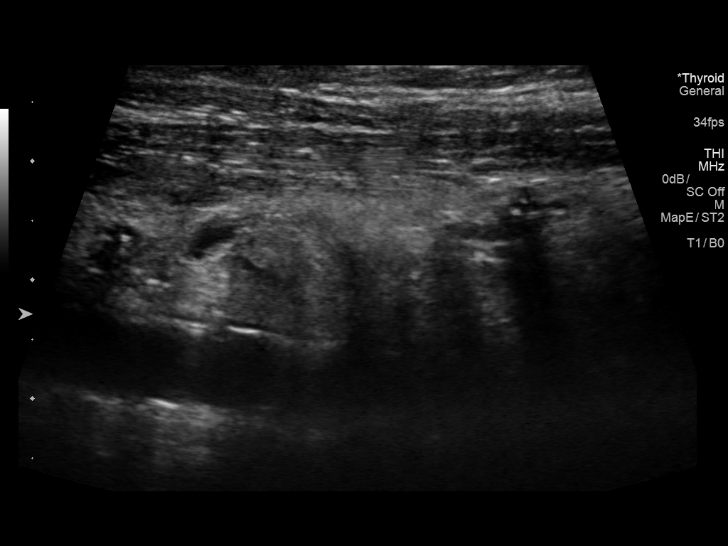
[im 10/29]
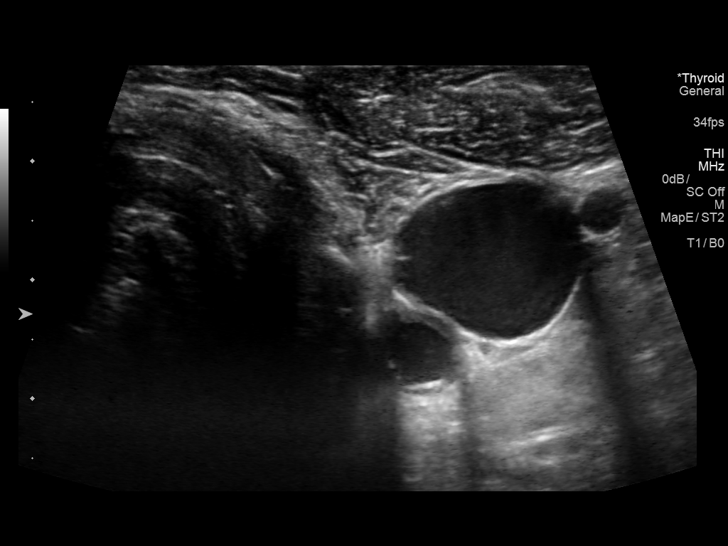
[im 11/29]
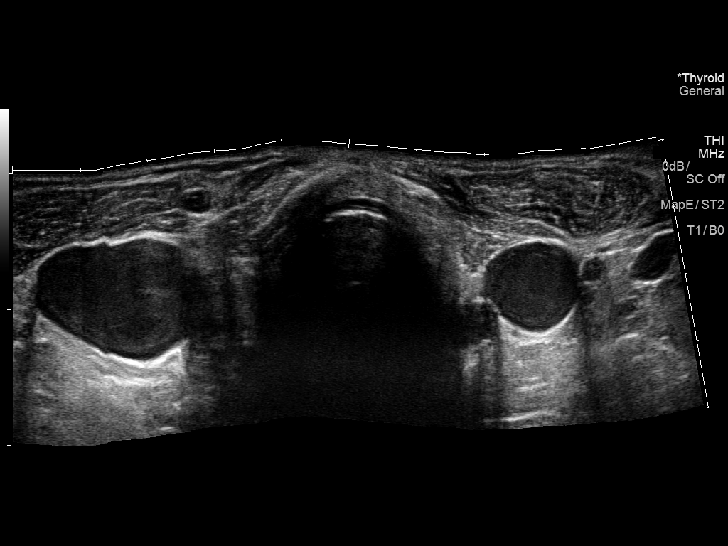
[im 13/29]
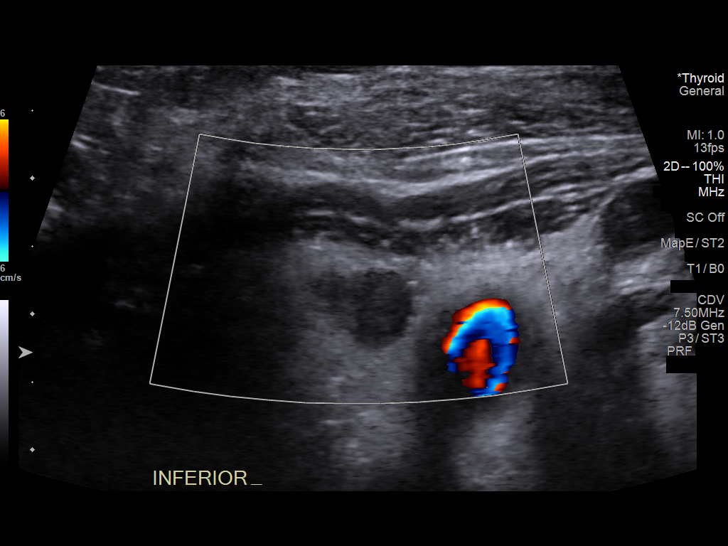
[im 16/29]
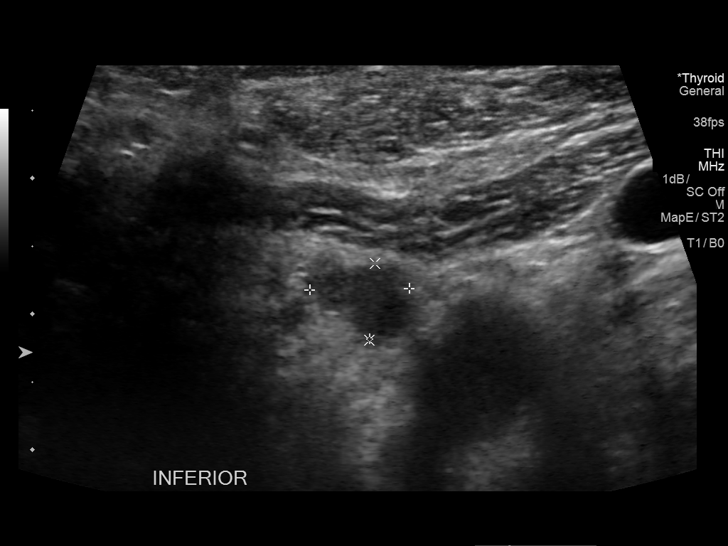
[im 18/29]
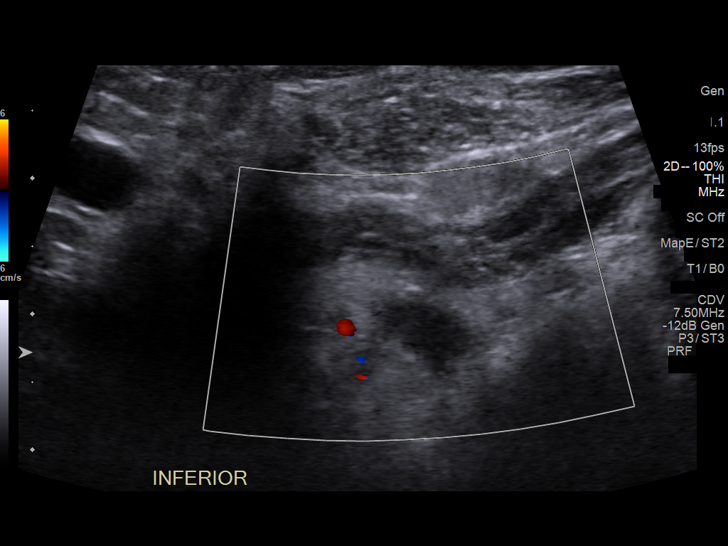
[im 19/29]
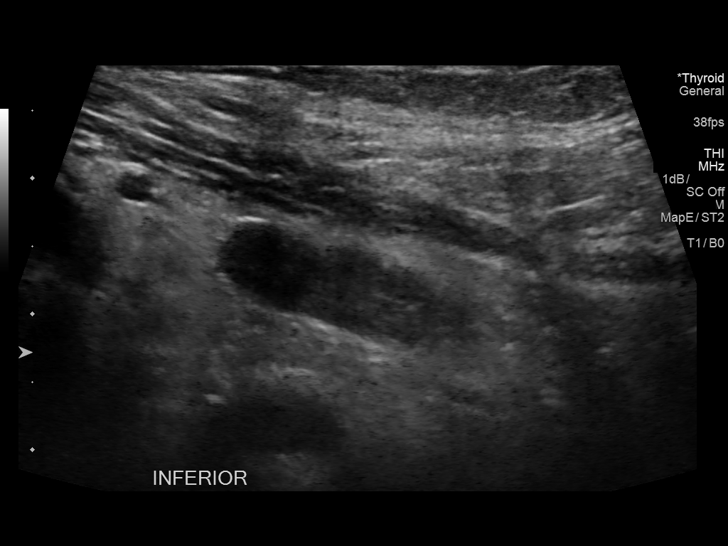
[im 22/29]
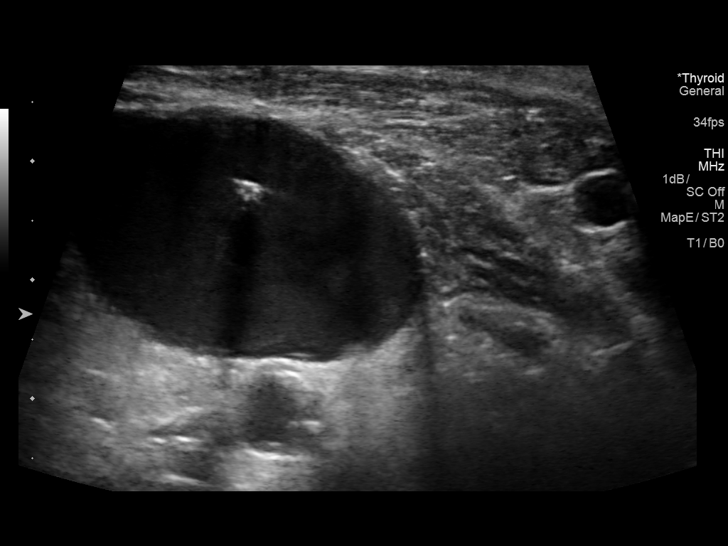
[im 24/29]
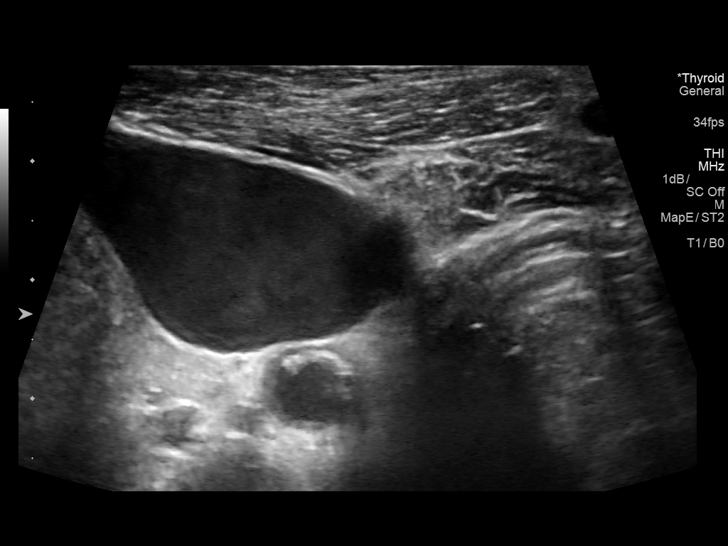
[im 26/29]
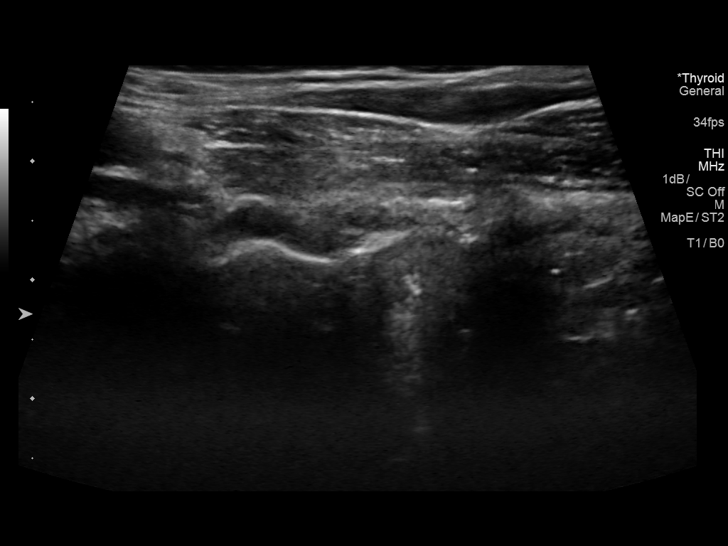
[im 29/29]
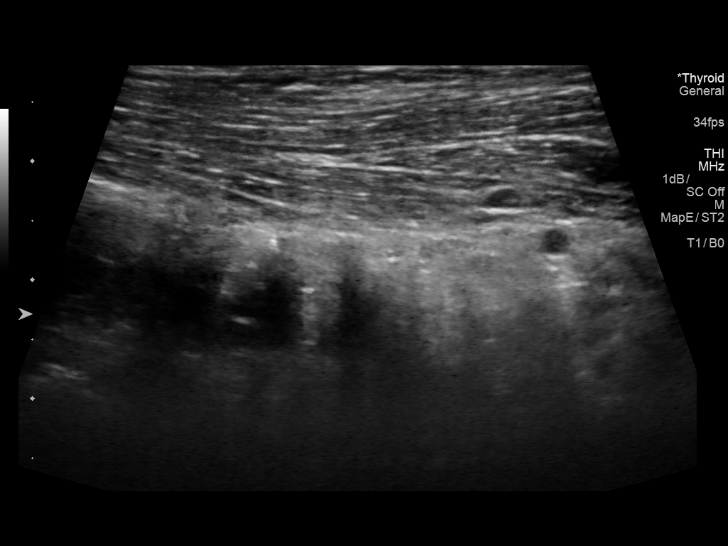

[14 of 25 positions shown; findings below may reference images not displayed]

FINDINGS: Isthmus: Surgically absent

Right lobe: No residual right thyroid bed significant soft tissue
abnormality.

Left lobe: Persistent hypoechoic nodule in the left thyroid bed
measures 1.6 x 0.7 x 0.6 cm, previously 1.3 x 0.8 x 0.6 cm. No
significant interval change. This has been previously biopsied
08/20/2016. Correlate with prior pathology.

No adenopathy.
IMPRESSION: Stable left thyroid bed hypoechoic nodule which has been previously
biopsied 08/20/2016. Correlate with prior pathology. Otherwise
stable post thyroidectomy ultrasound.

No adenopathy.

The above is in keeping with the ACR TI-RADS recommendations - [HOSPITAL] 3259;[DATE].

## 2018-04-12 ENCOUNTER — Encounter: Payer: Self-pay | Admitting: Family

## 2018-04-12 ENCOUNTER — Ambulatory Visit (INDEPENDENT_AMBULATORY_CARE_PROVIDER_SITE_OTHER): Payer: Medicare HMO | Admitting: Family

## 2018-04-12 VITALS — BP 89/49 | HR 79 | Temp 95.1°F | Resp 16 | Ht 73.0 in | Wt 229.0 lb

## 2018-04-12 DIAGNOSIS — R17 Unspecified jaundice: Secondary | ICD-10-CM | POA: Diagnosis not present

## 2018-04-12 DIAGNOSIS — E875 Hyperkalemia: Secondary | ICD-10-CM | POA: Diagnosis not present

## 2018-04-12 DIAGNOSIS — I87309 Chronic venous hypertension (idiopathic) without complications of unspecified lower extremity: Secondary | ICD-10-CM | POA: Diagnosis not present

## 2018-04-12 DIAGNOSIS — S71102D Unspecified open wound, left thigh, subsequent encounter: Secondary | ICD-10-CM

## 2018-04-12 DIAGNOSIS — N186 End stage renal disease: Secondary | ICD-10-CM

## 2018-04-12 DIAGNOSIS — D509 Iron deficiency anemia, unspecified: Secondary | ICD-10-CM | POA: Diagnosis not present

## 2018-04-12 DIAGNOSIS — Z992 Dependence on renal dialysis: Secondary | ICD-10-CM

## 2018-04-12 DIAGNOSIS — D631 Anemia in chronic kidney disease: Secondary | ICD-10-CM | POA: Diagnosis not present

## 2018-04-12 DIAGNOSIS — N2581 Secondary hyperparathyroidism of renal origin: Secondary | ICD-10-CM | POA: Diagnosis not present

## 2018-04-12 DIAGNOSIS — Z79899 Other long term (current) drug therapy: Secondary | ICD-10-CM | POA: Diagnosis not present

## 2018-04-12 DIAGNOSIS — E441 Mild protein-calorie malnutrition: Secondary | ICD-10-CM | POA: Diagnosis not present

## 2018-04-12 NOTE — Progress Notes (Signed)
[facility] [place]   CC: Follow up wound check  History of Present Illness Adam [name]. is a 57 y.o. male with multiple medical problems including end-stage renal disease that presents for follow-up wound check of a left thigh wound after excision of a sinus tract in his thigh back in August 2019 by Dr. [name].  Ultimately this wound has been very slow to heal.  He has undergone several additional debridements in the OR.  We tried Dakin's as well as Santyl to try and clean the wound up.  Overall the family feels like the wound is making progress but they have noticed some new debris in the wound edge over the last week when Dr. Carlis Abbott saw him on 03-23-18.  No fevers or chills.  Separately his left arm hero graft was working well.  Dr. Carlis Abbott last evaluated pt on 03-23-18. At that time the left upper thigh wound had been very slow to heal even after several additional debridements in the OR.  At that time he had been using Santyl for some enzymatic debridement.  On evaluation that day he had some green fibrinous exudate in the edge of the wound that was concerning for Pseudomonas.  Dr. Carlis Abbott recommended that we put him back in Dakin's wet-to-dry twice a day and have him come back in several weeks for another wound check and discussed that if no improvement and/or worsening at that time will need to go back to the OR for further debridement.  Pt denies any claudication type symptoms in his feet or legs with walking, denies non healing wounds in his feet or lower legs.   Pt has not been allowing the dressing to dry before removing, he is wetting the dressing with Dakin's solution before removing, not allowing for debridement, is changing dressing bid. He notes improvement, less fibrinous exudate, he showed me a photo he took of this wound from yesterday that has more fibrinous exudate.   He does home dialysis via the left upper arm graft on M-T-TH-[id number].  He states his kidneys  failed due to uncontrolled hypertension, now he has hypotension, but states he is asymptomatic of this.  He states he needs to ask [place] if an access can be placed in is right arm, due to high venous pressures at his left upper arm Hero graft in the last week or two. He has not yet discussed this with his nephrologist, is scheduled to see him or her next week and states he will discuss this at that time.   Diabetic: No Tobacco use: non-smoker  Pt meds include: Statin :Yes Betablocker: No ASA: No Other anticoagulants/antiplatelets: Eliquis  Past Medical History:  Diagnosis Date  . Abscess 02/2018   LEFT THIGH   . AICD (automatic cardioverter/defibrillator) present    [employer] - s/p extraction 08/21/17 due to staph infection  . Anemia   . Aortic stenosis    moderate AS by 08/2016 echo  . Arthritis    "knees" (08/05/2016)  . Atrial fibrillation ([place])   . ESRD (end stage renal disease) on dialysis [facility]    ESRD due to HTN started HD 2003, had renal Tx 2009 for 18 mos. Was on NX stage hemodialysis as of 2018 but in late 2019 is back at [facility] incenter HD.   Marland Kitchen Great toe amputation status    status post left hallux amputation 08/01/14  . History of blood transfusion 2009   "S/P biopsy for prostate cancer check"  . Hypothyroidism (acquired)   .  Mitral stenosis    moderate mitral stenosis  . Nonhealing surgical wound    left thigh  . Nonischemic cardiomyopathy ([place])   . Pneumonia 2014; 08/05/2016  . Pulmonary HTN ([place])   . Renal insufficiency   . Thyroid cancer [facility]) 2011    Social History Social History   Tobacco Use  . Smoking status: Never Smoker  . Smokeless tobacco: Never Used  Substance Use Topics  . Alcohol use: Yes    Alcohol/week: 0.0 standard drinks    Comment: social  . Drug use: No    Family History Family History  Problem Relation Age of Onset  . Heart failure Mother   . Hypertension Mother   . CAD Mother 56  . Emphysema Mother   . Hypertension Father   .  Kidney failure Father     Past Surgical History:  Procedure Laterality Date  . A/V SHUNTOGRAM N/A 02/25/2018   Procedure: A/V SHUNTOGRAM;  Surgeon: Marty Heck, MD;  Location: [place] CV [facility];  Service: Cardiovascular;  Laterality: N/A;  lt arm fistula  . ARTERIAL LINE INSERTION Right 08/24/2017   Procedure: ARTERIAL LINE INSERTION INTO RIGHT FEMORAL ARTERY;  Surgeon: Conrad Third Lake, MD;  Location: [place];  Service: Vascular;  Laterality: Right;  . BASCILIC VEIN TRANSPOSITION Right 10/13/2017   Procedure: FIRST STAGE BRACHIAL VEIN TRANSPOSITION RIGHT ARM;  Surgeon: Conrad De Queen, MD;  Location: [place];  Service: Vascular;  Laterality: Right;  . BIOPSY  09/25/2017   Procedure: BIOPSY;  Surgeon: Ronnette Juniper, MD;  Location: [facility] ENDOSCOPY;  Service: Gastroenterology;;  . CARDIAC CATHETERIZATION Right 2017  . CARDIAC DEFIBRILLATOR PLACEMENT  2009  . DIALYSIS FISTULA CREATION Left 2003   "upper arm"  . DIALYSIS/PERMA CATHETER REMOVAL Left 09/01/2017   Procedure: DIALYSIS/PERMA CATHETER REMOVAL;  Surgeon: Conrad Winchester, MD;  Location: [place];  Service: Vascular;  Laterality: Left;  . ESOPHAGOGASTRODUODENOSCOPY (EGD) WITH PROPOFOL N/A 09/25/2017   Procedure: ESOPHAGOGASTRODUODENOSCOPY (EGD) WITH PROPOFOL;  Surgeon: Ronnette Juniper, MD;  Location: [place];  Service: Gastroenterology;  Laterality: N/A;  . HERNIA REPAIR    . I&D EXTREMITY Right 11/06/2017   Procedure: IRRIGATION AND DEBRIDEMENT KNEE;  Surgeon: Rod Can, MD;  Location: [place];  Service: Orthopedics;  Laterality: Right;  . I&D EXTREMITY Left 01/03/2018   Procedure: IRRIGATION AND DEBRIDEMENT OF THIGH, wound vac application  wound size 10cm x 2.5cm x 2.5cm;  Surgeon: Waynetta Sandy, MD;  Location: [place];  Service: Vascular;  Laterality: Left;  . ICD LEAD REMOVAL Right 08/20/2017   Procedure: ICD EXTRACTION WITH C-ARM;  Surgeon: Evans Lance, MD;  Location: [facility] OR;  Service: Cardiovascular;  Laterality: Right;  DR VAN  TRIGT TO BACK-UP  . icd removed    . INSERTION OF DIALYSIS CATHETER N/A 11/11/2016   Procedure: INSERTION OF TUNNELED DIALYSIS CATHETER;  Surgeon: Angelia Mould, MD;  Location: [place];  Service: Vascular;  Laterality: N/A;  . INSERTION OF DIALYSIS CATHETER Left 08/24/2017   Procedure: INSERTION OF DIALYSIS CATHETER;  Surgeon: Conrad Seminole, MD;  Location: [place];  Service: Vascular;  Laterality: Left;  . INSERTION OF DIALYSIS CATHETER Left 09/01/2017   Procedure: INSERTION OF DIALYSIS CATHETER;  Surgeon: Conrad Innsbrook, MD;  Location: [place];  Service: Vascular;  Laterality: Left;  . KIDNEY TRANSPLANT  2009  . KNEE SURGERY Bilateral 2000s   "drained fluid"  . LAPAROSCOPIC GASTRIC BANDING  2006  . LIGATION OF ARTERIOVENOUS  FISTULA Right 11/10/2017  Procedure: LIGATION OF BRACHIAL VEIN TRANSPOSITION RIGHT ARM;  Surgeon: Angelia Mould, MD;  Location: [place];  Service: Vascular;  Laterality: Right;  . MASS EXCISION N/A 01/12/2018   Procedure: EXCISION OF MULTIPLE SUBCUTANEOUS MASSES;  Surgeon: Ralene Ok, MD;  Location: [place];  Service: General;  Laterality: N/A;  . PROSTATE BIOPSY  2009  . REVISON OF ARTERIOVENOUS FISTULA Left 9/60/4540   Procedure: PLICATION OF ARTERIOVENOUS FISTULA ANEURYSM;  Surgeon: Elam Dutch, MD;  Location: [place];  Service: Vascular;  Laterality: Left;  . REVISON OF ARTERIOVENOUS FISTULA Left 11/10/2016   Procedure: REVISON OF ARTERIOVENOUS FISTULA - LEFT UPPER ARM;  Surgeon: Elam Dutch, MD;  Location: [place];  Service: Vascular;  Laterality: Left;  . REVISON OF ARTERIOVENOUS FISTULA Left 08/24/2017   Procedure: REVISON OF ARTERIOVENOUS FISTULA LEFT;  Surgeon: Conrad Troutdale, MD;  Location: [place];  Service: Vascular;  Laterality: Left;  . TEE WITHOUT CARDIOVERSION N/A 08/20/2017   Procedure: TRANSESOPHAGEAL ECHOCARDIOGRAM (TEE);  Surgeon: Evans Lance, MD;  Location: [place];  Service: Cardiovascular;  Laterality: N/A;  . THROMBECTOMY W/ EMBOLECTOMY  Left 12/25/2014   Procedure: THROMBECTOMY ARTERIOVENOUS FISTULA;  Surgeon: Elam Dutch, MD;  Location: [place];  Service: Vascular;  Laterality: Left;  . THYROIDECTOMY  2011  . TOE AMPUTATION Bilateral     right 1st and  2nd digits; left 1st and 3rd digits"  . UMBILICAL HERNIA REPAIR  X 2  . UPPER EXTREMITY VENOGRAPHY Right 11/05/2017   Procedure: UPPER EXTREMITY VENOGRAPHY;  Surgeon: Conrad Leadington, MD;  Location: [place] CV [facility];  Service: Cardiovascular;  Laterality: Right;  Marland Kitchen VASCULAR ACCESS DEVICE INSERTION Left 11/10/2017   Procedure: INSERTION OF SUPER HERO VASCULAR ACCESS DEVICE LEFT ARM;  Surgeon: Angelia Mould, MD;  Location: [place];  Service: Vascular;  Laterality: Left;  Marland Kitchen VEIN HARVEST Left 08/24/2017   Procedure: VEIN HARVEST;  Surgeon: Conrad New Washington, MD;  Location: [place];  Service: Vascular;  Laterality: Left;  . WOUND DEBRIDEMENT Left 12/22/2017   Procedure: EXCISION OF LEFT THIGH WOUND;  Surgeon: Elam Dutch, MD;  Location: [place];  Service: Vascular;  Laterality: Left;  . WOUND DEBRIDEMENT Left 01/28/2018   Procedure: DEBRIDEMENT WOUND LEFT THIGH;  Surgeon: Marty Heck, MD;  Location: [place];  Service: Vascular;  Laterality: Left;  . WOUND DEBRIDEMENT Right 02/23/2018   Procedure: DEBRIDEMENT AND IRRIGATION OF RIGHT FLANK WOUND;  Surgeon: Ralene Ok, MD;  Location: [place];  Service: General;  Laterality: Right;    Allergies  Allergen Reactions  . Enalapril Swelling and Other (See Comments)    MOUTH SWELLING EDEMA  . Iodinated Diagnostic Agents Nausea And Vomiting and Nausea Only    Current Outpatient Medications  Medication Sig Dispense Refill  . albuterol (PROVENTIL) (2.5 MG/3ML) 0.083% nebulizer solution Take 3 mLs (2.5 mg total) by nebulization every 6 (six) hours as needed for wheezing or shortness of breath. 360 mL 5  . ambrisentan (LETAIRIS) 10 MG tablet Take 1 tablet (10 mg total) by mouth daily. 30 tablet 11  . amiodarone (PACERONE) 200  MG tablet Take 200 mg by mouth daily.     Marland Kitchen apixaban (ELIQUIS) 2.5 MG TABS tablet Take 1 tablet (2.5 mg total) by mouth 2 (two) times daily. 60 tablet 0  . atorvastatin (LIPITOR) 40 MG tablet Take 1 tablet (40 mg total) by mouth every evening. 30 tablet 2  . cinacalcet (SENSIPAR) 90 MG tablet Take 90 mg by mouth  every evening.    Marland Kitchen levothyroxine (SYNTHROID, LEVOTHROID) 300 MCG tablet Take 1 tablet (300 mcg total) by mouth daily before breakfast. 90 tablet 3  . midodrine (PROAMATINE) 10 MG tablet TAKE 1 TABLET (10 MG TOTAL) BY MOUTH 3 (THREE) TIMES DAILY. 90 tablet 5  . multivitamin (RENA-VIT) TABS tablet TAKE 1 TABLET BY MOUTH EVERYDAY AT BEDTIME (Patient taking differently: Take 1 tablet by mouth at bedtime. ) 30 tablet 0  . nystatin (MYCOSTATIN/NYSTOP) powder Apply topically 4 (four) times daily. (Patient taking differently: Apply 1 g topically daily. ) 60 g 0  . pantoprazole (PROTONIX) 40 MG tablet Take 1 tablet (40 mg total) by mouth 2 (two) times daily. 30 tablet 1  . tadalafil (ADCIRCA/CIALIS) 20 MG tablet Take 20 mg by mouth daily.     . traMADol (ULTRAM) 50 MG tablet Take 1 tablet (50 mg total) by mouth every 6 (six) hours as needed. 28 tablet 0   No current facility-administered medications for this visit.     ROS: See HPI for pertinent positives and negatives.   Physical Examination  Vitals:   04/12/18 1254  BP: (!) 89/49  Pulse: 79  Resp: 16  Temp: (!) 95.1 F (35.1 C)  SpO2: 96%  Weight: 229 lb (103.9 kg)  Height: 6\' 1"  (1.854 m)    Body mass index is 30.21 kg/m.  General: A&O x 3, WDWN, obese male. Gait: normal HENT: No gross abnormalities.  Eyes: PERRLA. Pulmonary: Respirations are non labored, CTAB, good air movement in all fields Cardiac: regular rhythm, no detected murmur.         Carotid Bruits Right Left   Negative Positive   Radial pulses are 1+ palpable bilaterally   Adominal aortic pulse is not palpable                         VASCULAR  EXAM: Extremities without ischemic changes, without Gangrene; with open wound, see photo below   Left anterior upper thigh                                                                                                           LE Pulses Right Left       FEMORAL  2+ palpable  2+ palpable        POPLITEAL  not palpable   not palpable       POSTERIOR TIBIAL  not palpable   not palpable        DORSALIS PEDIS      ANTERIOR TIBIAL not palpable  not palpable    Abdomen: soft, NT, no palpable masses. Skin: no rashes, no cellulitis; see above photo. Dry flaky skin of lower legs.  Musculoskeletal: no muscle wasting or atrophy.  Neurologic: A&O X 3; appropriate affect, Sensation is normal; MOTOR FUNCTION:  moving all extremities equally, motor strength 5/5 throughout. Speech is fluent/normal. CN 2-12 intact. Psychiatric: Thought content is normal, mood appropriate for clinical situation.     ASSESSMENT: Slade Pierpoint. is a 57 y.o. male  who presents for follow-up wound check of a left thigh wound after excision of a sinus tract in his thigh back in August 2019 by Dr. [name].  Ultimately this wound has been very slow to heal.  He has undergone several additional debridements in the OR.  We tried Dakin's as well as Santyl to try and clean the wound up.  Overall the family feels like the wound is making progress but they have noticed some new debris in the wound edge over the last week when Dr. Carlis Abbott saw him on 03-23-18.  No fevers or chills.   The left upper anterior thigh open wound appears to be improving, no greenish exudate as noted on previous visit, beefy red tissue at wound.  See Plan.  We will await to hear from his nephrologist before considering another HD access site; he has a HERO graft in his left upper arm.  He is hypotensive, but is asymptomatic of this.    PLAN:  Based on the patient's HPI and examination, and after discussing with Dr. Trula Slade, pt will return to  clinic in 2 weeks for wound check with Dr. Carlis Abbott or Dr. [name]. Continue Dakin's solution dressing changes bid, but allow to dry before removing, do not moisten with more Dakins.   I discussed in depth with the patient the nature of atherosclerosis, and emphasized the importance of maximal medical management including strict control of blood pressure, blood glucose, and lipid levels, obtaining regular exercise, and continued cessation of smoking.  The patient is aware that without maximal medical management the underlying atherosclerotic disease process will progress, limiting the benefit of any interventions.  The patient was given information about PAD including signs, symptoms, treatment, what symptoms should prompt the patient to seek immediate medical care, and risk reduction measures to take.  Clemon Chambers, RN, MSN, FNP-C [facility] Phone: [phone]  Clinic MD: Trula Slade  04/12/18 1:03 PM

## 2018-04-13 ENCOUNTER — Ambulatory Visit: Payer: Medicare HMO | Admitting: Endocrinology

## 2018-04-13 ENCOUNTER — Encounter: Payer: Self-pay | Admitting: Cardiovascular Disease

## 2018-04-13 ENCOUNTER — Ambulatory Visit: Payer: Medicare HMO | Admitting: Cardiovascular Disease

## 2018-04-13 VITALS — BP 98/70 | HR 84 | Ht 73.0 in | Wt 224.8 lb

## 2018-04-13 DIAGNOSIS — N189 Chronic kidney disease, unspecified: Secondary | ICD-10-CM

## 2018-04-13 DIAGNOSIS — D631 Anemia in chronic kidney disease: Secondary | ICD-10-CM

## 2018-04-13 DIAGNOSIS — N186 End stage renal disease: Secondary | ICD-10-CM

## 2018-04-13 DIAGNOSIS — I480 Paroxysmal atrial fibrillation: Secondary | ICD-10-CM

## 2018-04-13 DIAGNOSIS — Z79899 Other long term (current) drug therapy: Secondary | ICD-10-CM | POA: Diagnosis not present

## 2018-04-13 DIAGNOSIS — Z5181 Encounter for therapeutic drug level monitoring: Secondary | ICD-10-CM | POA: Diagnosis not present

## 2018-04-13 DIAGNOSIS — I2721 Secondary pulmonary arterial hypertension: Secondary | ICD-10-CM | POA: Diagnosis not present

## 2018-04-13 DIAGNOSIS — I5042 Chronic combined systolic (congestive) and diastolic (congestive) heart failure: Secondary | ICD-10-CM | POA: Diagnosis not present

## 2018-04-13 NOTE — Addendum Note (Signed)
Addended by: Sanda Klein on: 04/13/2018 04:01 PM   Modules accepted: Orders

## 2018-04-13 NOTE — Progress Notes (Addendum)
Cardiology Office Note    Date:  04/13/2018   ID:  Adam Alm., DOB [birth year], MRN [id number]  PCP:  Adam Skiff, Adam [name]  Cardiologist:  Adam [name], M.D.; Adam [name], M.D. (PAD); Adam Klein, Adam [name]   Chief Complaint  Patient presents with  . Congestive Heart Failure    depressed LVEF  . Follow-up    PAH  . Cardiac Valve Problem    AS  . Atrial Fibrillation    History of Present Illness:  Adam Borges. is a 57 y.o. male with advanced nonischemic cardiomyopathy, pulmonary arterial hypertension, peripheral arterial disease, renal failure on hemodialysis, paroxysmal atrial fibrillation here for follow-up.  His defibrillator was explanted August 20, 2017.  The site has healed well.  He remains relatively hypotensive and takes Midrin 3 times daily.  Despite this he has not had to interrupt dialysis sessions due to hypotension.  He is fairly sedentary but denies exertional dyspnea or angina.  He has not experienced syncope.  He has lost quite a bit of weight and is no longer obese.  He denies cough or hemoptysis.  He has varying degrees of leg swelling depending on dialysis.  He was hospitalized with a wound infection in October and an echo was repeated at that time (see below).  During that hospitalization he was found to have anemia.  He was hospitalized again with volume overload in November and dialyzed to remove about 7-8 L of excess fluid.  He was quite anemic with a hemoglobin of 6.9 and received 2 units of PRBC.  He continues to take adjusted dose Eliquis.  Hemoglobin was 8.6 on November 18, day of discharge.  There is a plan for him to have EGD and colonoscopy on December 10, pending assessment of periprocedural risk.  The echo showed LVEF 60-65% with mild LVH, moderate aortic stenosis (mean gradient 25 mmHg, area 1.15 cm), moderate TR with an estimated systolic PA pressure of 59 mmHg and severe right atrial enlargement.  No vegetations were seen.  His  follow-up echocardiogram in October 11 showed a drop in ejection fraction to 30-35%, still with moderate aortic stenosis, mean gradient 26 mmHg, severe right ventricular enlargement and moderate right ventricular systolic dysfunction, estimated systolic PA pressure 61 mmHg.  On my review his echocardiogram I believe the ejection fraction is not that low, but rather than 40-45%.  There is profound abnormal septal motion due to RV volume and pressure overload which makes the EF look worse.  I agree that the biggest problem is severe right ventricular dysfunction.  Note that his defibrillator was implanted in 2009 for unclear indication.  Echos from [place] from 2012, 2014 and 2015 all showed normal left ventricular systolic function.  The patient specifically denies any chest pain at rest exertion, dyspnea at rest or with exertion, orthopnea, paroxysmal nocturnal dyspnea, syncope, palpitations, focal neurological deficits, intermittent claudication, lower extremity edema, cough, hemoptysis or wheezing.  He has not noticed any melena or hematochezia or hematemesis. He is anuric ever since the failure of his kidney transplant.   He already weighs much less than he did at hospital discharge.  I am not sure anymore what his dry weight is.  His defibrillator is a Medtronic Evera XT dual-chamber device (generator change February 2016, initial device and leads implanted in 2009: atrial lead St. Jude [id number] serial number [id number], defibrillator lead Medtronic [phone] Sprint Quattro secure serial number [id number]). He reports 2 previous defibrillator shocks that each occurred roughly 4 years  ago on separate occasions. He did not have syncope with either event. He believes that the defibrillator discharges were "appropriate". One happened while he was riding in an elevator, the other happened while he was walking at a slow pace. He also has a history of paroxysmal atrial fibrillation for which he takes amiodarone and  warfarin. He is on combination vasodilator therapy for pulmonary artery hypertension, etiology uncertain. He is now seeing Adam [name] for this . He has a history of thyroidectomy following thyroid cancer and is on chronic thyroid hormone replacement. 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 He is currently receiving hemodialysis via a left antecubital AV fistula.  His defibrillator was explanted April 2019 secondary to infection.  His dialysis shunt was explanted at the same time.  Echo showed improved LV function and there was no plan for device reimplantation.  Most recent echocardiogram during acute illness in October shows EF 30-35%, probably an underestimation.  Past Medical History:  Diagnosis Date  . Abscess 02/2018   LEFT THIGH   . AICD (automatic cardioverter/defibrillator) present    Medtronic - s/p extraction 08/21/17 due to staph infection  . Anemia   . Aortic stenosis    moderate AS by 08/2016 echo  . Arthritis    "knees" (08/05/2016)  . Atrial fibrillation (Adam [name])   . ESRD (end stage renal disease) on dialysis [facility])    ESRD due to HTN started HD 2003, had renal Tx 2009 for 18 mos. Was on NX stage hemodialysis as of 2018 but in late 2019 is back at [facility] incenter HD.   Marland Kitchen Great toe amputation status    status post left hallux amputation 08/01/14  . History of blood transfusion 2009   "S/P biopsy for prostate cancer check"  . Hypothyroidism (acquired)   . Mitral stenosis    moderate mitral stenosis  . Nonhealing surgical wound    left thigh  . Nonischemic cardiomyopathy ([place])   . Pneumonia 2014; 08/05/2016  . Pulmonary HTN ([place])   . Renal insufficiency   . Thyroid cancer ([place]) 2011    Past Surgical History:  Procedure Laterality Date  . A/V SHUNTOGRAM N/A 02/25/2018   Procedure: A/V SHUNTOGRAM;  Surgeon: Adam Heck, Adam [name];  Location: [place] CV LAB;  Service: Cardiovascular;  Laterality: N/A;  lt arm fistula  . ARTERIAL LINE INSERTION Right 08/24/2017   Procedure: ARTERIAL LINE INSERTION INTO RIGHT FEMORAL ARTERY;  Surgeon: Adam [place], Adam [name];  Location: [place];  Service: Vascular;  Laterality: Right;  . BASCILIC VEIN TRANSPOSITION Right 10/13/2017   Procedure: FIRST STAGE BRACHIAL VEIN TRANSPOSITION RIGHT ARM;  Surgeon: Adam Shiloh, Adam [name];  Location: [place];  Service: Vascular;  Laterality: Right;  . BIOPSY  09/25/2017   Procedure: BIOPSY;  Surgeon: Adam Juniper, Adam [name];  Location: [facility] ENDOSCOPY;  Service: Gastroenterology;;  . CARDIAC CATHETERIZATION Right 2017  . CARDIAC DEFIBRILLATOR PLACEMENT  2009  . DIALYSIS FISTULA CREATION Left 2003   "upper arm"  . DIALYSIS/PERMA CATHETER REMOVAL Left 09/01/2017   Procedure: DIALYSIS/PERMA CATHETER REMOVAL;  Surgeon: Adam [place], Adam [name];  Location: [place];  Service: Vascular;  Laterality: Left;  . ESOPHAGOGASTRODUODENOSCOPY (EGD) WITH PROPOFOL N/A 09/25/2017   Procedure: ESOPHAGOGASTRODUODENOSCOPY (EGD) WITH PROPOFOL;  Surgeon: Adam Juniper, Adam [name];  Location: [place];  Service: Gastroenterology;  Laterality: N/A;  . HERNIA REPAIR    . I&D EXTREMITY Right 11/06/2017   Procedure:  IRRIGATION AND DEBRIDEMENT KNEE;  Surgeon: Adam Can, Adam [name];  Location: [place];  Service: Orthopedics;  Laterality: Right;  . I&D EXTREMITY Left 01/03/2018   Procedure: IRRIGATION AND DEBRIDEMENT OF THIGH, wound vac application  wound size 10cm x 2.5cm x 2.5cm;  Surgeon: Waynetta Sandy, Adam [name];  Location: [place];  Service: Vascular;  Laterality: Left;  . ICD LEAD REMOVAL Right 08/20/2017   Procedure: ICD EXTRACTION WITH C-ARM;  Surgeon: Evans Lance, Adam [name];  Location: [facility] OR;  Service: Cardiovascular;  Laterality: Right;  Adam VAN TRIGT TO BACK-UP  . icd removed    . INSERTION OF DIALYSIS CATHETER N/A 11/11/2016   Procedure: INSERTION OF TUNNELED DIALYSIS CATHETER;  Surgeon: Angelia Mould, Adam [name];  Location: [place];   Service: Vascular;  Laterality: N/A;  . INSERTION OF DIALYSIS CATHETER Left 08/24/2017   Procedure: INSERTION OF DIALYSIS CATHETER;  Surgeon: Adam Lohman, Adam [name];  Location: [place];  Service: Vascular;  Laterality: Left;  . INSERTION OF DIALYSIS CATHETER Left 09/01/2017   Procedure: INSERTION OF DIALYSIS CATHETER;  Surgeon: Adam Calvin, Adam [name];  Location: [place];  Service: Vascular;  Laterality: Left;  . KIDNEY TRANSPLANT  2009  . KNEE SURGERY Bilateral 2000s   "drained fluid"  . LAPAROSCOPIC GASTRIC BANDING  2006  . LIGATION OF ARTERIOVENOUS  FISTULA Right 11/10/2017   Procedure: LIGATION OF BRACHIAL VEIN TRANSPOSITION RIGHT ARM;  Surgeon: Angelia Mould, Adam [name];  Location: [place];  Service: Vascular;  Laterality: Right;  . MASS EXCISION N/A 01/12/2018   Procedure: EXCISION OF MULTIPLE SUBCUTANEOUS MASSES;  Surgeon: Ralene Ok, Adam [name];  Location: [place];  Service: General;  Laterality: N/A;  . PROSTATE BIOPSY  2009  . REVISON OF ARTERIOVENOUS FISTULA Left 06/23/9415   Procedure: PLICATION OF ARTERIOVENOUS FISTULA ANEURYSM;  Surgeon: Elam Dutch, Adam [name];  Location: [place];  Service: Vascular;  Laterality: Left;  . REVISON OF ARTERIOVENOUS FISTULA Left 11/10/2016   Procedure: REVISON OF ARTERIOVENOUS FISTULA - LEFT UPPER ARM;  Surgeon: Elam Dutch, Adam [name];  Location: [place];  Service: Vascular;  Laterality: Left;  . REVISON OF ARTERIOVENOUS FISTULA Left 08/24/2017   Procedure: REVISON OF ARTERIOVENOUS FISTULA LEFT;  Surgeon: Adam [place], Adam [name];  Location: [place];  Service: Vascular;  Laterality: Left;  . TEE WITHOUT CARDIOVERSION N/A 08/20/2017   Procedure: TRANSESOPHAGEAL ECHOCARDIOGRAM (TEE);  Surgeon: Evans Lance, Adam [name];  Location: [place];  Service: Cardiovascular;  Laterality: N/A;  . THROMBECTOMY W/ EMBOLECTOMY Left 12/25/2014   Procedure: THROMBECTOMY ARTERIOVENOUS FISTULA;  Surgeon: Elam Dutch, Adam [name];  Location: [place];  Service: Vascular;  Laterality: Left;  . THYROIDECTOMY  2011  . TOE AMPUTATION  Bilateral     right 1st and  2nd digits; left 1st and 3rd digits"  . UMBILICAL HERNIA REPAIR  X 2  . UPPER EXTREMITY VENOGRAPHY Right 11/05/2017   Procedure: UPPER EXTREMITY VENOGRAPHY;  Surgeon: Adam [place], Adam [name];  Location: [place] CV LAB;  Service: Cardiovascular;  Laterality: Right;  Marland Kitchen VASCULAR ACCESS DEVICE INSERTION Left 11/10/2017   Procedure: INSERTION OF SUPER HERO VASCULAR ACCESS DEVICE LEFT ARM;  Surgeon: Angelia Mould, Adam [name];  Location: [place];  Service: Vascular;  Laterality: Left;  Marland Kitchen VEIN HARVEST Left 08/24/2017   Procedure: VEIN HARVEST;  Surgeon: Adam Grandfather, Adam [name];  Location: [place];  Service: Vascular;  Laterality: Left;  . WOUND DEBRIDEMENT Left 12/22/2017   Procedure: EXCISION OF LEFT THIGH WOUND;  Surgeon: Elam Dutch, Adam [name];  Location: [place];  Service: Vascular;  Laterality: Left;  . WOUND DEBRIDEMENT Left 01/28/2018   Procedure: DEBRIDEMENT WOUND LEFT THIGH;  Surgeon: Adam Heck, Adam [name];  Location: [place];  Service: Vascular;  Laterality: Left;  . WOUND DEBRIDEMENT Right 02/23/2018   Procedure: DEBRIDEMENT AND IRRIGATION OF RIGHT FLANK WOUND;  Surgeon: Ralene Ok, Adam [name];  Location: [place];  Service: General;  Laterality: Right;    Current Medications: Outpatient Medications Prior to Visit  Medication Sig Dispense Refill  . albuterol (PROVENTIL) (2.5 MG/3ML) 0.083% nebulizer solution Take 3 mLs (2.5 mg total) by nebulization every 6 (six) hours as needed for wheezing or shortness of breath. 360 mL 5  . ambrisentan (LETAIRIS) 10 MG tablet Take 1 tablet (10 mg total) by mouth daily. 30 tablet 11  . amiodarone (PACERONE) 200 MG tablet Take 200 mg by mouth daily.     Marland Kitchen atorvastatin (LIPITOR) 40 MG tablet Take 1 tablet (40 mg total) by mouth every evening. 30 tablet 2  . cinacalcet (SENSIPAR) 90 MG tablet Take 90 mg by mouth every evening.    Marland Kitchen levothyroxine (SYNTHROID, LEVOTHROID) 300 MCG tablet Take 1 tablet (300 mcg total) by mouth daily before breakfast. 90  tablet 3  . midodrine (PROAMATINE) 10 MG tablet TAKE 1 TABLET (10 MG TOTAL) BY MOUTH 3 (THREE) TIMES DAILY. 90 tablet 5  . multivitamin (RENA-VIT) TABS tablet TAKE 1 TABLET BY MOUTH EVERYDAY AT BEDTIME (Patient taking differently: Take 1 tablet by mouth at bedtime. ) 30 tablet 0  . nystatin (MYCOSTATIN/NYSTOP) powder Apply topically 4 (four) times daily. (Patient taking differently: Apply 1 g topically daily. ) 60 g 0  . pantoprazole (PROTONIX) 40 MG tablet Take 1 tablet (40 mg total) by mouth 2 (two) times daily. 30 tablet 1  . tadalafil (ADCIRCA/CIALIS) 20 MG tablet Take 20 mg by mouth daily.     . traMADol (ULTRAM) 50 MG tablet Take 1 tablet (50 mg total) by mouth every 6 (six) hours as needed. 28 tablet 0  . apixaban (ELIQUIS) 2.5 MG TABS tablet Take 1 tablet (2.5 mg total) by mouth 2 (two) times daily. 60 tablet 0   No facility-administered medications prior to visit.      Allergies:   Enalapril and Iodinated diagnostic agents   Social History   Socioeconomic History  . Marital status: Married    Spouse name: Not on file  . Number of children: 1  . Years of education: Not on file  . Highest education level: Not on file  Occupational History  . Occupation: Retired Designer, industrial/product from [employer]  . Financial resource strain: Not on file  . Food insecurity:    Worry: Not on file    Inability: Not on file  . Transportation needs:    Medical: Not on file    Non-medical: Not on file  Tobacco Use  . Smoking status: Never Smoker  . Smokeless tobacco: Never Used  Substance and Sexual Activity  . Alcohol use: Yes    Alcohol/week: 0.0 standard drinks    Comment: social  . Drug use: No  . Sexual activity: Yes  Lifestyle  . Physical activity:    Days per week: Not on file    Minutes per session: Not on file  . Stress: Not on file  Relationships  . Social connections:    Talks on phone: Not on file    Gets together: Not on file    Attends religious service: Not on  file    Active  member of club or organization: Not on file    Attends meetings of clubs or organizations: Not on file    Relationship status: Not on file  Other Topics Concern  . Not on file  Social History Narrative   Retired Archivist.       Family History:  The patient's family history includes CAD (age of onset: 39) in his mother; Emphysema in his mother; Heart failure in his mother; Hypertension in his father and mother; Kidney failure in his father.   ROS:   Please see the history of present illness.    ROS All other systems reviewed and are negative.   PHYSICAL EXAM:   VS:  BP 98/70   Pulse 84   Ht 6\' 1"  (1.854 m)   Wt 224 lb 12.8 oz (102 kg)   SpO2 92%   BMI 29.66 kg/m     General: Alert, oriented x3, no distress, right Head: no evidence of trauma, PERRL, EOMI, no exophtalmos or lid lag, no myxedema, no xanthelasma; normal ears, nose and oropharynx Neck: 6-8 cm elevation jugular venous pulsations and prompt hepatojugular reflux; brisk carotid pulses without delay and no carotid bruits Chest: clear to auscultation, no signs of consolidation by percussion or palpation, normal fremitus, symmetrical and full respiratory excursions Cardiovascular: normal position and quality of the apical impulse, regular rhythm, normal first and second heart sounds, 3/6 holosystolic murmur at the left lower sternal border, no diastolic murmurs, rubs or gallops Abdomen: no tenderness or distention, no masses by palpation, no abnormal pulsatility or arterial bruits, normal bowel sounds, no hepatosplenomegaly Extremities: no clubbing, cyanosis or edema; 2+ radial, ulnar and brachial pulses bilaterally; 2+ right femoral, posterior tibial and dorsalis pedis pulses; 2+ left femoral, posterior tibial and dorsalis pedis pulses; no subclavian or femoral bruits Neurological: grossly nonfocal Psych: Normal mood and affect   Wt Readings from Last 3 Encounters:  04/13/18 224 lb 12.8 oz  (102 kg)  04/12/18 229 lb (103.9 kg)  03/31/18 241 lb (109.3 kg)      Studies/Labs Reviewed:   EKG:  EKG is not ordered today. 03/26/2018 reviewed by me: Sinus rhythm first-degree AV block, extremely low voltage throughout with possible old anterior and inferior infarctions, not much change from previous tracings.  Recent Labs: 08/26/2017: Magnesium 1.8 02/18/2018: ALT 12 02/19/2018: TSH 3.781 03/29/2018: BUN 22; Creatinine, Ser 4.79; Hemoglobin 8.6; Platelets 105; Potassium 3.7; Sodium 133   Lipid Panel    Component Value Date/Time   CHOL 149 03/24/2016 1616   TRIG 142 03/24/2016 1616   HDL 40 (L) 03/24/2016 1616   CHOLHDL 3.7 03/24/2016 1616   VLDL 28 03/24/2016 1616   LDLCALC 81 03/24/2016 1616    ASSESSMENT:    1. Chronic combined systolic and diastolic heart failure (HCC)   2. Paroxysmal atrial fibrillation ([place])   3. Encounter for monitoring amiodarone therapy   4. PAH (pulmonary artery hypertension) ([place])   5. ESRD (end stage renal disease) ([place])   6. Anemia associated with chronic renal failure      PLAN:  In order of problems listed above: 1. CHF: In my opinion, the assessment of LVEF on the study from October was an underestimation and his real ejection fraction is 40-45%.  On physical exam he has dominant findings of right heart failure, nothing to suggest left heart failure.  He does not meet criteria for defibrillator implantation and this would be at high risk proposition anyway.  He has a history of angioedema with  ACE inhibitors and his blood pressure does not limit vasodilators any.  Volume management is with dialysis.  It would appear quite possible that he has amyloidosis, specifically mutant [id number].  Note extremely low voltage on ECG, despite moderate LVH. However, would expect that this would have been diagnosed when he had his kidney transplant.  Will check labs. 2. AFib: None has been recorded recently (as far as I know we have not seen any atrial  fibrillation since he moved to [place]; none have been detected on his defibrillator generator up to the point of explantation).  At this point, I think it would be a better idea to discontinue his anticoagulant in view of his serious problems with anemia.  There is a plan to discontinue his anticoagulant for his endoscopic procedures anyway. 3. Amiodarone: it was never clear whether this was prescribed for atrial fibrillation or ventricular tachycardia. We will continue for the time being.  Normal liver and thyroid function test in October 2019. 4. PAH: PDE 5 inhibitors and endothelin antagonist, monitored by Adam. [place].  Requires Midodrine to maintain blood pressure.  This also raises suspicion for possible amyloidosis. 5. ESRD: To allow hemodialysis, he needs to take Midodrine 10 mg TID.   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  We do not know the initial reason for kidney failure. 6. Anemia: He is at moderate risk for complications with sedation for EGD and colonoscopy.  His risk is definitely higher than the average patient, primarily due to the presence of moderate to severe pulmonary artery hypertension and the risk of severe decompensation if he becomes hypoxemic; as well as a baseline low systemic blood pressure.  Although the risk is not prohibitive, the study should be performed if he will have a big impact on his future care.  The most recent iron profile that I have is from August 2019 that showed normal transferrin saturation ratio.  Obviously, his anemia could be largely related to kidney failure.  His EGD in May 2019 showed grade C esophagitis with nonbleeding ulcers and the plan was to repeat the study to assess for healing 2 months later.    Medication Adjustments/Labs and Tests Ordered: Current medicines are reviewed at length with the patient today.  Concerns regarding medicines are outlined above.  Medication changes, Labs and Tests ordered today are listed in the Patient  Instructions below. Patient Instructions  Medication Instructions:  Adam [name] recommends that you continue on your current medications as directed. Please refer to the Current Medication list given to you today.  If you need a refill on your cardiac medications before your next appointment, please call your pharmacy.   Follow-Up: At [facility], you and your health needs are our priority.  As part of our continuing mission to provide you with exceptional heart care, we have created designated Provider Care Teams.  These Care Teams include your primary Cardiologist (physician) and Advanced Practice Providers (APPs -  Physician Assistants and Nurse Practitioners) who all work together to provide you with the care you need, when you need it. You will need a follow up appointment in 6 months.  Please call our office 2 months in advance to schedule this appointment.  You may see Minus Breeding, Adam [name] or one of the following Advanced Practice Providers on your designated Care Team:   Rosaria Ferries, PA-C . Jory Sims, DNP, ANP  Adam [name] recommends that you follow-up with him as needed.    Signed, Adam Klein, Adam [name]  04/13/2018 4:00  PM    [facility] [place], [place], [place]  [address] Phone: [phone]; Fax: [phone]

## 2018-04-13 NOTE — Patient Instructions (Signed)
Medication Instructions:  Dr Sallyanne Kuster recommends that you continue on your current medications as directed. Please refer to the Current Medication list given to you today.  If you need a refill on your cardiac medications before your next appointment, please call your pharmacy.   Follow-Up: At [facility], you and your health needs are our priority.  As part of our continuing mission to provide you with exceptional heart care, we have created designated Provider Care Teams.  These Care Teams include your primary Cardiologist (physician) and Advanced Practice Providers (APPs -  Physician Assistants and Nurse Practitioners) who all work together to provide you with the care you need, when you need it. You will need a follow up appointment in 6 months.  Please call our office 2 months in advance to schedule this appointment.  You may see Minus Breeding, MD or one of the following Advanced Practice Providers on your designated Care Team:   Rosaria Ferries, PA-C . Jory Sims, DNP, ANP  Dr Sallyanne Kuster recommends that you follow-up with him as needed.

## 2018-04-14 ENCOUNTER — Encounter: Payer: Self-pay | Admitting: Family Medicine

## 2018-04-14 ENCOUNTER — Ambulatory Visit: Payer: Medicare HMO | Admitting: Pulmonary Disease

## 2018-04-15 DIAGNOSIS — I255 Ischemic cardiomyopathy: Secondary | ICD-10-CM | POA: Diagnosis not present

## 2018-04-15 DIAGNOSIS — D631 Anemia in chronic kidney disease: Secondary | ICD-10-CM | POA: Diagnosis not present

## 2018-04-15 DIAGNOSIS — I6529 Occlusion and stenosis of unspecified carotid artery: Secondary | ICD-10-CM | POA: Diagnosis not present

## 2018-04-15 DIAGNOSIS — I120 Hypertensive chronic kidney disease with stage 5 chronic kidney disease or end stage renal disease: Secondary | ICD-10-CM | POA: Diagnosis not present

## 2018-04-15 DIAGNOSIS — T8189XD Other complications of procedures, not elsewhere classified, subsequent encounter: Secondary | ICD-10-CM | POA: Diagnosis not present

## 2018-04-15 DIAGNOSIS — I080 Rheumatic disorders of both mitral and aortic valves: Secondary | ICD-10-CM | POA: Diagnosis not present

## 2018-04-15 DIAGNOSIS — E669 Obesity, unspecified: Secondary | ICD-10-CM | POA: Diagnosis not present

## 2018-04-15 DIAGNOSIS — Z95810 Presence of automatic (implantable) cardiac defibrillator: Secondary | ICD-10-CM | POA: Diagnosis not present

## 2018-04-15 DIAGNOSIS — T8189XA Other complications of procedures, not elsewhere classified, initial encounter: Secondary | ICD-10-CM | POA: Diagnosis not present

## 2018-04-15 DIAGNOSIS — J9601 Acute respiratory failure with hypoxia: Secondary | ICD-10-CM | POA: Diagnosis not present

## 2018-04-15 DIAGNOSIS — A419 Sepsis, unspecified organism: Secondary | ICD-10-CM | POA: Diagnosis not present

## 2018-04-15 DIAGNOSIS — T8130XA Disruption of wound, unspecified, initial encounter: Secondary | ICD-10-CM | POA: Diagnosis not present

## 2018-04-15 DIAGNOSIS — I9589 Other hypotension: Secondary | ICD-10-CM | POA: Diagnosis not present

## 2018-04-15 DIAGNOSIS — T829XXA Unspecified complication of cardiac and vascular prosthetic device, implant and graft, initial encounter: Secondary | ICD-10-CM | POA: Diagnosis not present

## 2018-04-15 DIAGNOSIS — N186 End stage renal disease: Secondary | ICD-10-CM | POA: Diagnosis not present

## 2018-04-15 DIAGNOSIS — I480 Paroxysmal atrial fibrillation: Secondary | ICD-10-CM | POA: Diagnosis not present

## 2018-04-15 DIAGNOSIS — I700 Atherosclerosis of aorta: Secondary | ICD-10-CM | POA: Diagnosis not present

## 2018-04-15 DIAGNOSIS — R7881 Bacteremia: Secondary | ICD-10-CM | POA: Diagnosis not present

## 2018-04-19 ENCOUNTER — Ambulatory Visit: Payer: Medicare HMO | Admitting: Orthotics

## 2018-04-19 DIAGNOSIS — R69 Illness, unspecified: Secondary | ICD-10-CM | POA: Diagnosis not present

## 2018-04-19 DIAGNOSIS — F40298 Other specified phobia: Secondary | ICD-10-CM | POA: Diagnosis not present

## 2018-04-19 DIAGNOSIS — Z899 Acquired absence of limb, unspecified: Secondary | ICD-10-CM

## 2018-04-19 DIAGNOSIS — R2681 Unsteadiness on feet: Secondary | ICD-10-CM

## 2018-04-19 DIAGNOSIS — E1149 Type 2 diabetes mellitus with other diabetic neurological complication: Secondary | ICD-10-CM | POA: Diagnosis not present

## 2018-04-19 NOTE — Progress Notes (Signed)
Patient came in today to pick up Moore Balance Brace (Bilateral).   Patient was able to don brace independently and brace was check for fit to custom.   Patient was observed walking with brace and gait was improved as well as stability.   Patient was advised of care and wearing instructions.  Advised to notify practice if there were any issues; especially skin irritation.  

## 2018-04-20 ENCOUNTER — Ambulatory Visit (HOSPITAL_COMMUNITY): Admission: RE | Admit: 2018-04-20 | Payer: Medicare HMO | Source: Ambulatory Visit | Admitting: Gastroenterology

## 2018-04-20 ENCOUNTER — Other Ambulatory Visit: Payer: Self-pay | Admitting: Physician Assistant

## 2018-04-20 ENCOUNTER — Ambulatory Visit: Payer: Medicare HMO | Admitting: Endocrinology

## 2018-04-20 ENCOUNTER — Other Ambulatory Visit: Payer: Self-pay | Admitting: Family

## 2018-04-20 ENCOUNTER — Encounter (HOSPITAL_COMMUNITY): Admission: RE | Payer: Self-pay | Source: Ambulatory Visit

## 2018-04-20 DIAGNOSIS — I080 Rheumatic disorders of both mitral and aortic valves: Secondary | ICD-10-CM | POA: Diagnosis not present

## 2018-04-20 DIAGNOSIS — I700 Atherosclerosis of aorta: Secondary | ICD-10-CM | POA: Diagnosis not present

## 2018-04-20 DIAGNOSIS — I255 Ischemic cardiomyopathy: Secondary | ICD-10-CM | POA: Diagnosis not present

## 2018-04-20 DIAGNOSIS — I480 Paroxysmal atrial fibrillation: Secondary | ICD-10-CM | POA: Diagnosis not present

## 2018-04-20 DIAGNOSIS — I120 Hypertensive chronic kidney disease with stage 5 chronic kidney disease or end stage renal disease: Secondary | ICD-10-CM | POA: Diagnosis not present

## 2018-04-20 DIAGNOSIS — I9589 Other hypotension: Secondary | ICD-10-CM | POA: Diagnosis not present

## 2018-04-20 DIAGNOSIS — D631 Anemia in chronic kidney disease: Secondary | ICD-10-CM | POA: Diagnosis not present

## 2018-04-20 DIAGNOSIS — T8189XD Other complications of procedures, not elsewhere classified, subsequent encounter: Secondary | ICD-10-CM | POA: Diagnosis not present

## 2018-04-20 DIAGNOSIS — N186 End stage renal disease: Secondary | ICD-10-CM | POA: Diagnosis not present

## 2018-04-20 SURGERY — EGD (ESOPHAGOGASTRODUODENOSCOPY)
Anesthesia: Monitor Anesthesia Care

## 2018-04-20 MED ORDER — APIXABAN 2.5 MG PO TABS: 2.5000 mg | ORAL_TABLET | Freq: Two times a day (BID) | ORAL | 2 refills | Status: DC

## 2018-04-21 ENCOUNTER — Ambulatory Visit: Payer: Medicare HMO | Admitting: Pulmonary Disease

## 2018-04-21 ENCOUNTER — Emergency Department (HOSPITAL_COMMUNITY)
Admission: EM | Admit: 2018-04-21 | Discharge: 2018-04-21 | Disposition: A | Discharge status: Home / Self Care | Payer: Medicare HMO | Attending: Emergency Medicine | Admitting: Emergency Medicine

## 2018-04-21 DIAGNOSIS — Z85850 Personal history of malignant neoplasm of thyroid: Secondary | ICD-10-CM | POA: Insufficient documentation

## 2018-04-21 DIAGNOSIS — Z4800 Encounter for change or removal of nonsurgical wound dressing: Secondary | ICD-10-CM | POA: Diagnosis not present

## 2018-04-21 DIAGNOSIS — I132 Hypertensive heart and chronic kidney disease with heart failure and with stage 5 chronic kidney disease, or end stage renal disease: Secondary | ICD-10-CM | POA: Insufficient documentation

## 2018-04-21 DIAGNOSIS — N186 End stage renal disease: Secondary | ICD-10-CM | POA: Insufficient documentation

## 2018-04-21 DIAGNOSIS — T8131XA Disruption of external operation (surgical) wound, not elsewhere classified, initial encounter: Secondary | ICD-10-CM | POA: Diagnosis not present

## 2018-04-21 DIAGNOSIS — L7622 Postprocedural hemorrhage and hematoma of skin and subcutaneous tissue following other procedure: Secondary | ICD-10-CM | POA: Insufficient documentation

## 2018-04-21 DIAGNOSIS — E1122 Type 2 diabetes mellitus with diabetic chronic kidney disease: Secondary | ICD-10-CM | POA: Insufficient documentation

## 2018-04-21 DIAGNOSIS — Z79899 Other long term (current) drug therapy: Secondary | ICD-10-CM | POA: Diagnosis not present

## 2018-04-21 DIAGNOSIS — Z940 Kidney transplant status: Secondary | ICD-10-CM | POA: Insufficient documentation

## 2018-04-21 DIAGNOSIS — Z89412 Acquired absence of left great toe: Secondary | ICD-10-CM | POA: Diagnosis not present

## 2018-04-21 DIAGNOSIS — Z5189 Encounter for other specified aftercare: Secondary | ICD-10-CM

## 2018-04-21 DIAGNOSIS — Z95810 Presence of automatic (implantable) cardiac defibrillator: Secondary | ICD-10-CM | POA: Insufficient documentation

## 2018-04-21 DIAGNOSIS — I5022 Chronic systolic (congestive) heart failure: Secondary | ICD-10-CM | POA: Insufficient documentation

## 2018-04-21 DIAGNOSIS — E039 Hypothyroidism, unspecified: Secondary | ICD-10-CM | POA: Diagnosis not present

## 2018-04-21 DIAGNOSIS — I959 Hypotension, unspecified: Secondary | ICD-10-CM | POA: Diagnosis not present

## 2018-04-21 DIAGNOSIS — Z992 Dependence on renal dialysis: Secondary | ICD-10-CM | POA: Insufficient documentation

## 2018-04-21 DIAGNOSIS — R58 Hemorrhage, not elsewhere classified: Secondary | ICD-10-CM

## 2018-04-21 LAB — CBG MONITORING, ED
GLUCOSE-CAPILLARY: 60 mg/dL — AB (ref 70–99)
Glucose-Capillary: 58 mg/dL — ABNORMAL LOW (ref 70–99)

## 2018-04-21 LAB — CBC
HCT: 41.6 % (ref 39.0–52.0)
Hemoglobin: 12.2 g/dL — ABNORMAL LOW (ref 13.0–17.0)
MCH: 28 pg (ref 26.0–34.0)
MCHC: 29.3 g/dL — ABNORMAL LOW (ref 30.0–36.0)
MCV: 95.4 fL (ref 80.0–100.0)
NRBC: 0 % (ref 0.0–0.2)
Platelets: 92 10*3/uL — ABNORMAL LOW (ref 150–400)
RBC: 4.36 MIL/uL (ref 4.22–5.81)
RDW: 19.8 % — ABNORMAL HIGH (ref 11.5–15.5)
WBC: 4.3 10*3/uL (ref 4.0–10.5)

## 2018-04-21 LAB — BASIC METABOLIC PANEL
ANION GAP: 18 — AB (ref 5–15)
BUN: 17 mg/dL (ref 6–20)
CALCIUM: 9.1 mg/dL (ref 8.9–10.3)
CO2: 24 mmol/L (ref 22–32)
CREATININE: 4.51 mg/dL — AB (ref 0.61–1.24)
Chloride: 92 mmol/L — ABNORMAL LOW (ref 98–111)
GFR calc Af Amer: 16 mL/min — ABNORMAL LOW (ref >60)
GFR calc non Af Amer: 14 mL/min — ABNORMAL LOW (ref >60)
GLUCOSE: 61 mg/dL — AB (ref 70–99)
POTASSIUM: 3.3 mmol/L — AB (ref 3.5–5.1)
Sodium: 134 mmol/L — ABNORMAL LOW (ref 135–145)

## 2018-04-21 NOTE — ED Provider Notes (Signed)
[place] EMERGENCY DEPARTMENT Provider Note   CSN: [id number] Arrival date & time: 04/21/18  1059     History   Chief Complaint Chief Complaint  Patient presents with  . Wound Check    HPI Adam [name]. is a 57 y.o. male.  HPI Patient is a 57 year old male who presents to the emergency department with complaints of blood from a surgical site in the left lateral chest mid axillary line.  He underwent excision of multiple subcutaneous masses in September 2019.  He states the small amount of blood came from the left lateral chest wound last night and then a skin a small amount of dark old blood came out today.  He came to the ER for evaluation.  He is a dialysis patient.  He had a normal run of dialysis yesterday.  He is not on anticoagulation.  He was previously on Eliquis but this was stopped 2 weeks ago.  He is had no bleeding in the past several hours.   Past Medical History:  Diagnosis Date  . Abscess 02/2018   LEFT THIGH   . AICD (automatic cardioverter/defibrillator) present    [employer] - s/p extraction 08/21/17 due to staph infection  . Anemia   . Aortic stenosis    moderate AS by 08/2016 echo  . Arthritis    "knees" (08/05/2016)  . Atrial fibrillation ([place])   . ESRD (end stage renal disease) on dialysis [facility])    ESRD due to HTN started HD 2003, had renal Tx 2009 for 18 mos. Was on NX stage hemodialysis as of 2018 but in late 2019 is back at [facility] HD.   Marland Kitchen Great toe amputation status    status post left hallux amputation 08/01/14  . History of blood transfusion 2009   "S/P biopsy for prostate cancer check"  . Hypothyroidism (acquired)   . Mitral stenosis    moderate mitral stenosis  . Nonhealing surgical wound    left thigh  . Nonischemic cardiomyopathy ([place])   . Pneumonia 2014; 08/05/2016  . Pulmonary HTN ([place])   . Renal insufficiency   . Thyroid cancer [facility]) 2011    Patient Active Problem List   Diagnosis Date Noted  .  Goals of care, counseling/discussion   . Palliative care by specialist   . DNR (do not resuscitate) discussion   . Chronic renal failure   . Acute on chronic systolic CHF (congestive heart failure) ([place])   . Acute pulmonary edema (HCC)   . Diabetic autonomic neuropathy associated with type 2 diabetes mellitus ([place])   . Complication of vascular access for dialysis   . Open wound of left thigh 03/23/2018  . Hypokalemia   . ESRD (end stage renal disease) on dialysis ([place])   . Insomnia 02/11/2018  . Chills 02/11/2018  . Shortness of breath 02/11/2018  . Leukopenia   . Wound infection 01/02/2018  . Nodule of chest wall 11/18/2017  . Calciphylaxis   . Abscess   . Acute pain of right knee   . Malnutrition of moderate degree 11/04/2017  . Septic arthritis of knee, right ([place]) 11/02/2017  . Dialysis AV fistula infection, sequela 11/02/2017  . Mass 10/29/2017  . Near syncope 09/24/2017  . Syncope, cardiogenic   . ESRD on hemodialysis ([place])   . Anemia 09/16/2017  . Diarrhea 09/16/2017  . Tarry stools 09/16/2017  . Acute encephalopathy   . Hypotension   . Complication of arteriovenous dialysis fistula   . Stage 5 chronic  kidney disease on chronic dialysis ([place])   . Infected defibrillator ([place])   . MSSA bacteremia 08/18/2017  . Sepsis ([place]) 08/17/2017  . Lymphadenopathy, mediastinal 04/10/2017  . Hypothyroidism 10/09/2016  . Acute respiratory failure with hypoxia ([place]) 08/06/2016  . HCAP (healthcare-associated pneumonia) 08/05/2016  . Thyroid cancer ([place]) 06/12/2016  . Epistaxis 03/04/2016  . Long term current use of amiodarone 01/10/2016  . Critical lower limb ischemia 12/04/2015  . Carotid artery stenosis 05/24/2015  . Excess skin 05/24/2015  . Osteomyelitis of left foot ([place]) 04/30/2015  . Status post amputation 04/19/2015  . Wound dehiscence 04/19/2015  . Sinus congestion 03/08/2015  . Bleeding pseudoaneurysm of left brachiocephalic arteriovenous fistula ([employer]) 12/24/2014  .  Pure hypercholesterolemia 11/09/2014  . Obesity 11/07/2014  . Healthcare maintenance 11/07/2014  . H/O malignant neoplasm of thyroid 11/07/2014  . Toe amputation status 10/13/2014  . ICD (implantable cardioverter-defibrillator) in place 07/25/2014  . ESRD (end stage renal disease) on hemodialysis 07/25/2014  . End-stage renal disease ([place]) 07/25/2014  . Automatic implantable cardioverter-defibrillator in situ 07/25/2014  . Pulmonary hypertension ([place]) June 03, 202016  . Secondary pulmonary hypertension June 03, 202016  . Atrial fibrillation [I48.91] 05/24/2014  . Long term current use of anticoagulant therapy 05/24/2014    Past Surgical History:  Procedure Laterality Date  . A/V SHUNTOGRAM N/A 02/25/2018   Procedure: A/V SHUNTOGRAM;  Surgeon: Marty Heck, MD;  Location: [place] CV LAB;  Service: Cardiovascular;  Laterality: N/A;  lt arm fistula  . ARTERIAL LINE INSERTION Right 08/24/2017   Procedure: ARTERIAL LINE INSERTION INTO RIGHT FEMORAL ARTERY;  Surgeon: Conrad Silex, MD;  Location: [place];  Service: Vascular;  Laterality: Right;  . BASCILIC VEIN TRANSPOSITION Right 10/13/2017   Procedure: FIRST STAGE BRACHIAL VEIN TRANSPOSITION RIGHT ARM;  Surgeon: Conrad Blades, MD;  Location: [place];  Service: Vascular;  Laterality: Right;  . BIOPSY  09/25/2017   Procedure: BIOPSY;  Surgeon: Ronnette Juniper, MD;  Location: [facility] ENDOSCOPY;  Service: Gastroenterology;;  . CARDIAC CATHETERIZATION Right 2017  . CARDIAC DEFIBRILLATOR PLACEMENT  2009  . DIALYSIS FISTULA CREATION Left 2003   "upper arm"  . DIALYSIS/PERMA CATHETER REMOVAL Left 09/01/2017   Procedure: DIALYSIS/PERMA CATHETER REMOVAL;  Surgeon: Conrad [place], MD;  Location: [place];  Service: Vascular;  Laterality: Left;  . ESOPHAGOGASTRODUODENOSCOPY (EGD) WITH PROPOFOL N/A 09/25/2017   Procedure: ESOPHAGOGASTRODUODENOSCOPY (EGD) WITH PROPOFOL;  Surgeon: Ronnette Juniper, MD;  Location: [place];  Service: Gastroenterology;  Laterality: N/A;  .  HERNIA REPAIR    . I&D EXTREMITY Right 11/06/2017   Procedure: IRRIGATION AND DEBRIDEMENT KNEE;  Surgeon: Rod Can, MD;  Location: [place];  Service: Orthopedics;  Laterality: Right;  . I&D EXTREMITY Left 01/03/2018   Procedure: IRRIGATION AND DEBRIDEMENT OF THIGH, wound vac application  wound size 10cm x 2.5cm x 2.5cm;  Surgeon: Waynetta Sandy, MD;  Location: [place];  Service: Vascular;  Laterality: Left;  . ICD LEAD REMOVAL Right 08/20/2017   Procedure: ICD EXTRACTION WITH C-ARM;  Surgeon: Evans Lance, MD;  Location[facility] OR;  Service: Cardiovascular;  Laterality: Right;  DR VAN TRIGT TO BACK-UP  . icd removed    . INSERTION OF DIALYSIS CATHETER N/A 11/11/2016   Procedure: INSERTION OF TUNNELED DIALYSIS CATHETER;  Surgeon: Angelia Mould, MD;  Location: [place];  Service: Vascular;  Laterality: N/A;  . INSERTION OF DIALYSIS CATHETER Left 08/24/2017   Procedure: INSERTION OF DIALYSIS CATHETER;  Surgeon: Conrad [place], MD;  Location: [place];  Service: Vascular;  Laterality: Left;  . INSERTION OF DIALYSIS CATHETER Left 09/01/2017   Procedure: INSERTION OF DIALYSIS CATHETER;  Surgeon: Conrad [place], MD;  Location: [place];  Service: Vascular;  Laterality: Left;  . KIDNEY TRANSPLANT  2009  . KNEE SURGERY Bilateral 2000s   "drained fluid"  . LAPAROSCOPIC GASTRIC BANDING  2006  . LIGATION OF ARTERIOVENOUS  FISTULA Right 11/10/2017   Procedure: LIGATION OF BRACHIAL VEIN TRANSPOSITION RIGHT ARM;  Surgeon: Angelia Mould, MD;  Location: [place];  Service: Vascular;  Laterality: Right;  . MASS EXCISION N/A 01/12/2018   Procedure: EXCISION OF MULTIPLE SUBCUTANEOUS MASSES;  Surgeon: Ralene Ok, MD;  Location: [place];  Service: General;  Laterality: N/A;  . PROSTATE BIOPSY  2009  . REVISON OF ARTERIOVENOUS FISTULA Left 5/40/0867   Procedure: PLICATION OF ARTERIOVENOUS FISTULA ANEURYSM;  Surgeon: Elam Dutch, MD;  Location: [place];  Service: Vascular;  Laterality: Left;  . REVISON  OF ARTERIOVENOUS FISTULA Left 11/10/2016   Procedure: REVISON OF ARTERIOVENOUS FISTULA - LEFT UPPER ARM;  Surgeon: Elam Dutch, MD;  Location: [place];  Service: Vascular;  Laterality: Left;  . REVISON OF ARTERIOVENOUS FISTULA Left 08/24/2017   Procedure: REVISON OF ARTERIOVENOUS FISTULA LEFT;  Surgeon: Conrad [place], MD;  Location: [place];  Service: Vascular;  Laterality: Left;  . TEE WITHOUT CARDIOVERSION N/A 08/20/2017   Procedure: TRANSESOPHAGEAL ECHOCARDIOGRAM (TEE);  Surgeon: Evans Lance, MD;  Location: [place];  Service: Cardiovascular;  Laterality: N/A;  . THROMBECTOMY W/ EMBOLECTOMY Left 12/25/2014   Procedure: THROMBECTOMY ARTERIOVENOUS FISTULA;  Surgeon: Elam Dutch, MD;  Location: [place];  Service: Vascular;  Laterality: Left;  . THYROIDECTOMY  2011  . TOE AMPUTATION Bilateral     right 1st and  2nd digits; left 1st and 3rd digits"  . UMBILICAL HERNIA REPAIR  X 2  . UPPER EXTREMITY VENOGRAPHY Right 11/05/2017   Procedure: UPPER EXTREMITY VENOGRAPHY;  Surgeon: Conrad Rockledge, MD;  Location: [place] CV LAB;  Service: Cardiovascular;  Laterality: Right;  Marland Kitchen VASCULAR ACCESS DEVICE INSERTION Left 11/10/2017   Procedure: INSERTION OF [place] VASCULAR ACCESS DEVICE LEFT ARM;  Surgeon: Angelia Mould, MD;  Location: [place];  Service: Vascular;  Laterality: Left;  Marland Kitchen VEIN HARVEST Left 08/24/2017   Procedure: VEIN HARVEST;  Surgeon: Conrad Arvin, MD;  Location: [place];  Service: Vascular;  Laterality: Left;  . WOUND DEBRIDEMENT Left 12/22/2017   Procedure: EXCISION OF LEFT THIGH WOUND;  Surgeon: Elam Dutch, MD;  Location: [place];  Service: Vascular;  Laterality: Left;  . WOUND DEBRIDEMENT Left 01/28/2018   Procedure: DEBRIDEMENT WOUND LEFT THIGH;  Surgeon: Marty Heck, MD;  Location: [place];  Service: Vascular;  Laterality: Left;  . WOUND DEBRIDEMENT Right 02/23/2018   Procedure: DEBRIDEMENT AND IRRIGATION OF RIGHT FLANK WOUND;  Surgeon: Ralene Ok, MD;   Location: [place];  Service: General;  Laterality: Right;        Home Medications    Prior to Admission medications   Medication Sig Start Date End Date Taking? Authorizing Provider  acetaminophen (TYLENOL) 500 MG tablet Take 1,000 mg by mouth daily as needed for moderate pain or headache.   Yes [provider]  albuterol (PROVENTIL) (2.5 MG/3ML) 0.083% nebulizer solution Take 3 mLs (2.5 mg total) by nebulization every 6 (six) hours as needed for wheezing or shortness of breath. 03/17/18  Yes Lauraine Rinne, NP  ambrisentan (LETAIRIS) 10 MG tablet Take 1 tablet (10 mg  total) by mouth daily. Patient taking differently: Take 10 mg by mouth every evening.  09/11/17  Yes Juanito Doom, MD  amiodarone (PACERONE) 200 MG tablet Take 200 mg by mouth every evening.    Yes [provider]  atorvastatin (LIPITOR) 40 MG tablet Take 1 tablet (40 mg total) by mouth every evening. 12/11/17  Yes Diallo, Abdoulaye, MD  cinacalcet (SENSIPAR) 90 MG tablet Take 90 mg by mouth every evening.   Yes [provider]  levothyroxine (SYNTHROID, LEVOTHROID) 300 MCG tablet Take 1 tablet (300 mcg total) by mouth daily before breakfast. 09/26/17  Yes Enid Derry, [place], DO  midodrine (PROAMATINE) 10 MG tablet TAKE 1 TABLET (10 MG TOTAL) BY MOUTH 3 (THREE) TIMES DAILY. 10/08/17  Yes Minus Breeding, MD  multivitamin (RENA-VIT) TABS tablet TAKE 1 TABLET BY MOUTH EVERYDAY AT BEDTIME Patient taking differently: Take 1 tablet by mouth at bedtime.  01/12/18  Yes Diallo, Abdoulaye, MD  nystatin (MYCOSTATIN/NYSTOP) powder Apply topically 4 (four) times daily. Patient taking differently: Apply 1 g topically daily as needed (rash).  11/18/17  Yes Diallo, Abdoulaye, MD  pantoprazole (PROTONIX) 40 MG tablet Take 1 tablet (40 mg total) by mouth 2 (two) times daily. 02/26/18 02/26/19 Yes Guadalupe Dawn, MD  Pseudoeph-Doxylamine-DM-APAP (NYQUIL PO) Take 2 capsules by mouth daily as needed (sleep/cold symptoms).   Yes  [provider]  tadalafil (ADCIRCA/CIALIS) 20 MG tablet Take 20 mg by mouth every evening.  10/06/17  Yes [provider]  apixaban (ELIQUIS) 2.5 MG TABS tablet Take 1 tablet (2.5 mg total) by mouth 2 (two) times daily. Patient not taking: Reported on 04/21/2018 04/20/18   Nickel, Sharmon Leyden, NP  traMADol (ULTRAM) 50 MG tablet Take 1 tablet (50 mg total) by mouth every 6 (six) hours as needed. Patient not taking: Reported on 04/14/2018 03/23/18 03/23/19  Marty Heck, MD    Family History Family History  Problem Relation Age of Onset  . Heart failure Mother   . Hypertension Mother   . CAD Mother 24  . Emphysema Mother   . Hypertension Father   . Kidney failure Father     Social History Social History   Tobacco Use  . Smoking status: Never Smoker  . Smokeless tobacco: Never Used  Substance Use Topics  . Alcohol use: Yes    Alcohol/week: 0.0 standard drinks    Comment: social  . Drug use: No     Allergies   Enalapril and Iodinated diagnostic agents   Review of Systems Review of Systems  All other systems reviewed and are negative.    Physical Exam Updated Vital Signs BP 98/64 (BP Location: Right Arm)   Pulse 79   Temp 98.2 F (36.8 C) (Oral)   Resp 17   SpO2 96%   Physical Exam  Constitutional: He is oriented to person, place, and time. He appears well-developed and well-nourished.  HENT:  Head: Normocephalic.  Eyes: EOM are normal.  Neck: Normal range of motion.  Cardiovascular: Normal rate.  Pulmonary/Chest: Effort normal and breath sounds normal.  Left lateral chest wound without surrounding erythema.  No drainage.  Unable to express any additional blood.  Abdominal: He exhibits no distension.  Musculoskeletal: Normal range of motion.  Neurological: He is alert and oriented to person, place, and time.  Psychiatric: He has a normal mood and affect.  Nursing note and vitals reviewed.    ED Treatments / Results  Labs (all  labs ordered are listed, but only abnormal results are displayed)  Labs Reviewed  CBC - Abnormal; Notable for the following components:      Result Value   Hemoglobin 12.2 (*)    MCHC 29.3 (*)    RDW 19.8 (*)    Platelets 92 (*)    All other components within normal limits  BASIC METABOLIC PANEL - Abnormal; Notable for the following components:   Sodium 134 (*)    Potassium 3.3 (*)    Chloride 92 (*)    Glucose, Bld 61 (*)    Creatinine, Ser 4.51 (*)    GFR calc non Af Amer 14 (*)    GFR calc Af Amer 16 (*)    Anion gap 18 (*)    All other components within normal limits  CBG MONITORING, ED - Abnormal; Notable for the following components:   Glucose-Capillary 58 (*)    All other components within normal limits  CBG MONITORING, ED    EKG None  Radiology No results found.  Procedures Procedures (including critical care time)  Medications Ordered in ED Medications - No data to display   Initial Impression / Assessment and Plan / ED Course  I have reviewed the triage vital signs and the nursing notes.  Pertinent labs & imaging results that were available during my care of the patient were reviewed by me and considered in my medical decision making (see chart for details).    Suspect this was old hematoma from the prior surgery that finally came out the wound.  There is otherwise no signs of infection.  His vital signs are stable.  His blood sugar was low but he did not have breakfast today.  He is eating and drinking now.  He has had no ongoing bleeding.  Patient is stable for discharge and follow-up with his surgeon as an outpatient.  Final Clinical Impressions(s) / ED Diagnoses   Final diagnoses:  None    ED Discharge Orders    None       Jola Schmidt, MD 04/21/18 1401

## 2018-04-21 NOTE — ED Notes (Signed)
Pt CBG 60. Notified Brooke, Therapist, sports. Notified Dr. Venora Maples. Per Dr. Venora Maples give food and drink. Gave pt orange juice and [place] sandwich.

## 2018-04-21 NOTE — ED Notes (Addendum)
Patient verbalized understanding of discharge instructions and denies any further needs or questions at this time. VS stable. Patient ambulatory with steady gait. Assisted to ED entrance in wheelchair. Given orange juice to take with him.

## 2018-04-21 NOTE — ED Triage Notes (Signed)
Per [facility]: Patient to ED from home with bleeding from surgical site to L midaxillary line. Pt had cyst removed a month ago and reports intermittent bleeding since. Wound was not closed and initially being packed. Estimated 100cc lost PTA. Bleeding currently controlled on own.

## 2018-04-22 ENCOUNTER — Ambulatory Visit: Payer: Medicare HMO | Admitting: Endocrinology

## 2018-04-22 DIAGNOSIS — Z0289 Encounter for other administrative examinations: Secondary | ICD-10-CM

## 2018-04-23 DIAGNOSIS — Z4800 Encounter for change or removal of nonsurgical wound dressing: Secondary | ICD-10-CM | POA: Diagnosis not present

## 2018-04-27 ENCOUNTER — Encounter: Payer: Self-pay | Admitting: Pulmonary Disease

## 2018-04-27 ENCOUNTER — Ambulatory Visit (INDEPENDENT_AMBULATORY_CARE_PROVIDER_SITE_OTHER): Payer: Medicare HMO | Admitting: Pulmonary Disease

## 2018-04-27 VITALS — BP 118/62 | HR 72 | Ht 71.5 in | Wt 222.2 lb

## 2018-04-27 DIAGNOSIS — I272 Pulmonary hypertension, unspecified: Secondary | ICD-10-CM | POA: Diagnosis not present

## 2018-04-27 DIAGNOSIS — I5023 Acute on chronic systolic (congestive) heart failure: Secondary | ICD-10-CM | POA: Diagnosis not present

## 2018-04-27 DIAGNOSIS — Z992 Dependence on renal dialysis: Secondary | ICD-10-CM | POA: Diagnosis not present

## 2018-04-27 DIAGNOSIS — N186 End stage renal disease: Secondary | ICD-10-CM

## 2018-04-27 DIAGNOSIS — R0602 Shortness of breath: Secondary | ICD-10-CM

## 2018-04-27 DIAGNOSIS — T82868A Thrombosis of vascular prosthetic devices, implants and grafts, initial encounter: Secondary | ICD-10-CM | POA: Diagnosis not present

## 2018-04-27 DIAGNOSIS — T82858A Stenosis of vascular prosthetic devices, implants and grafts, initial encounter: Secondary | ICD-10-CM | POA: Diagnosis not present

## 2018-04-27 NOTE — Patient Instructions (Signed)
Pulmonary hypertension: Continue taking ambrisentan and tadalafil as you are doing We will check liver function testing when you return in 3 months  Postural hypotension/hypotension with dialysis Continue taking Midrin as you are doing If you have worsening symptoms of dizziness on days of dialysis it is okay to hold the tadalafil that day Continue dialysis as directed by the dialysis clinic, dry weight 96 kg  Systolic heart failure: Well compensated today Continue follow-up with cardiology  Atrial fibrillation: Continue amiodarone  Follow up in 3 months or sooner if needed

## 2018-04-27 NOTE — Progress Notes (Signed)
Subjective:    Patient ID: Adam Alm., male    DOB: [birth year], 57 y.o.   MRN: [id number]  Synopsis: Diagnosed with pulmonary hypertension in 2012. 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 Unfortunately his kidney transplant failed for uncertain reasons by 1027. Was diagnosed with pulmonary hypertension by 2012 in [place]. Was started on [place] in [place] and moved to [place] not long afterwards. Developed atrial fibrillation around 2009, had a defibrillator placed. His dry weight was changed to 96kg in 04/2018.    08/2015 [facility]: Cardiac Index (l/min/m2) 3.7 L/min/m2  Right Atrium Mean Pressure (mmHg) 5 mmHg   Right Ventricle Systolic Pressure (mmHg) 46 mmHg   Pulmonary Artery Mean Pressure (mmHg) 31 mmHg   Pulmonary Wedge Pressure (mmHg) 11 mmHg   Pulmonary Vascular Resistance (Wood units) 2.2 Wood units   He had a second evaluation for renal transplant at [facility] in 2017 but was denied because of his hypertension and atrial fibrillation.  His dry weight is 122kg, his BP is best there.   He was hospitalized in early 2018 for healthcare associated pneumonia  In 2019 he was hospitalized for a staph infection involving a septic knee joint, he had to have his vascular dialysis graft removed and his pacemaker changed.  HPI Chief Complaint  Patient presents with  . Follow-up    c/o sob with exertion.  denies CP, cough, mucus production.     "I feel great".  His dry weight was changed to 96kg.  He can now walk further than before.  He still has some dyspnea on exertion, but not too bad.  He says that his dyspnea is manageable.  No cough to speak of other than a little sinus drainage.  He says that he will sometimes take some OTC meds that dry this up.    Past Medical History:  Diagnosis Date  . Abscess 02/2018   LEFT THIGH   . AICD (automatic cardioverter/defibrillator) present    [employer] - s/p extraction  08/21/17 due to staph infection  . Anemia   . Aortic stenosis    moderate AS by 08/2016 echo  . Arthritis    "knees" (08/05/2016)  . Atrial fibrillation ([place])   . ESRD (end stage renal disease) on dialysis [facility])    ESRD due to HTN started HD 2003, had renal Tx 2009 for 18 mos. Was on NX stage hemodialysis as of 2018 but in late 2019 is back at [facility] incenter HD.   Marland Kitchen Great toe amputation status    status post left hallux amputation 08/01/14  . History of blood transfusion 2009   "S/P biopsy for prostate cancer check"  . Hypothyroidism (acquired)   . Mitral stenosis    moderate mitral stenosis  . Nonhealing surgical wound    left thigh  . Nonischemic cardiomyopathy ([place])   . Pneumonia 2014; 08/05/2016  . Pulmonary HTN ([place])   . Renal insufficiency   . Thyroid cancer ([place]) 2011      Review of Systems  Constitutional: Positive for fatigue. Negative for chills and fever.  HENT: Negative for postnasal drip, rhinorrhea and sinus pressure.   Respiratory: Positive for shortness of breath. Negative for cough and wheezing.   Cardiovascular: Positive for leg swelling. Negative for chest pain and palpitations.       Objective:   Physical Exam Vitals:   04/27/18 1203  BP: 118/62  Pulse: 72  SpO2: 96%  Weight:  222 lb 3.2 oz (100.8 kg)  Height: 5' 11.5" (1.816 m)   RA  Gen: chronically ill appearing HENT: OP clear, TM's clear, neck supple PULM: CTA B, normal percussion CV: RRR, systolic heart murmur noted, trace edema GI: BS+, soft, nontender Derm: no cyanosis or rash Psyche: normal mood and affect   CBC    Component Value Date/Time   WBC 4.3 04/21/2018 1213   RBC 4.36 04/21/2018 1213   HGB 12.2 (L) 04/21/2018 1213   HGB 8.8 (L) 02/10/2018 1556   HCT 41.6 04/21/2018 1213   HCT 28.2 (L) 02/10/2018 1556   PLT 92 (L) 04/21/2018 1213   PLT 122 (L) 02/10/2018 1556   MCV 95.4 04/21/2018 1213   MCV 87 02/10/2018 1556   MCH 28.0 04/21/2018 1213   MCHC 29.3 (L) 04/21/2018 1213     RDW 19.8 (H) 04/21/2018 1213   RDW 17.7 (H) 02/10/2018 1556   LYMPHSABS 0.7 03/29/2018 0530   LYMPHSABS 0.5 (L) 02/10/2018 1556   MONOABS 0.6 03/29/2018 0530   EOSABS 0.1 03/29/2018 0530   EOSABS 0.1 02/10/2018 1556   BASOSABS 0.0 03/29/2018 0530   BASOSABS 0.0 02/10/2018 1556     6MW March 2017 6 minute walk test at [place] m, O2 saturation 100% on room air  Echo: March 2017 echocardiogram [facility] RVSP 40 mmHg, LVEF 55% March 2018 echo [facility]: RVSP 79mm Hg 08/2017 Echo RVSP 62mmHg 02/2018 Echo RVSP 64mmHg  RHC April 2017 right heart catheterization [facility] cardiac index 3.7, right atrial pressure 5, RVSP 46, mean PA pressure 31, pulmonary capillary wedge pressure 11, pulmonary vascular resistance 2.2  Records from his physician's office from yesterday (primary care) where he was seen for cellulitis, and a complete blood count was checked and 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     Assessment & Plan:   Pulmonary hypertension (HCC)  Acute on chronic systolic CHF (congestive heart failure) (HCC)  Shortness of breath  ESRD on hemodialysis [facility])  Discussion: This has been a stable interval for Adam [name].  Now that we have his dry weight down to a reasonable level his shortness of breath has improved significantly.  His pulmonary hypertension numbers are always a little higher than I would like for them to be but he does not have signs or symptoms of worsening right heart failure right now.  Because of his postural hypotension with dialysis we are challenged when trying to escalate pulmonary vascular therapy further.  I think for now holding the course medically without pulmonary vasodilator change and focusing on his physical conditioning is the best approach.  Pulmonary hypertension: Continue taking ambrisentan and tadalafil as you are doing We will check liver function testing when you return in 3 months  Postural  hypotension/hypotension with dialysis Continue taking Midrin as you are doing If you have worsening symptoms of dizziness on days of dialysis it is okay to hold the tadalafil that day Continue dialysis as directed by the dialysis clinic, dry weight 96 kg  Systolic heart failure: Well compensated today Continue follow-up with cardiology  Atrial fibrillation: Continue amiodarone  Follow up in 3 months or sooner if needed   Current Outpatient Medications:  .  acetaminophen (TYLENOL) 500 MG tablet, Take 1,000 mg by mouth daily as needed for moderate pain or headache., Disp: , Rfl:  .  albuterol (PROVENTIL) (2.5 MG/3ML) 0.083% nebulizer solution, Take 3 mLs (2.5 mg total) by nebulization every 6 (six) hours as needed for wheezing or  shortness of breath., Disp: 360 mL, Rfl: 5 .  ambrisentan (LETAIRIS) 10 MG tablet, Take 1 tablet (10 mg total) by mouth daily. (Patient taking differently: Take 10 mg by mouth every evening. ), Disp: 30 tablet, Rfl: 11 .  amiodarone (PACERONE) 200 MG tablet, Take 200 mg by mouth every evening. , Disp: , Rfl:  .  atorvastatin (LIPITOR) 40 MG tablet, Take 1 tablet (40 mg total) by mouth every evening., Disp: 30 tablet, Rfl: 2 .  cinacalcet (SENSIPAR) 90 MG tablet, Take 90 mg by mouth every evening., Disp: , Rfl:  .  levothyroxine (SYNTHROID, LEVOTHROID) 300 MCG tablet, Take 1 tablet (300 mcg total) by mouth daily before breakfast., Disp: 90 tablet, Rfl: 3 .  midodrine (PROAMATINE) 10 MG tablet, TAKE 1 TABLET (10 MG TOTAL) BY MOUTH 3 (THREE) TIMES DAILY., Disp: 90 tablet, Rfl: 5 .  multivitamin (RENA-VIT) TABS tablet, TAKE 1 TABLET BY MOUTH EVERYDAY AT BEDTIME (Patient taking differently: Take 1 tablet by mouth at bedtime. ), Disp: 30 tablet, Rfl: 0 .  nystatin (MYCOSTATIN/NYSTOP) powder, Apply topically 4 (four) times daily. (Patient taking differently: Apply 1 g topically daily as needed (rash). ), Disp: 60 g, Rfl: 0 .  pantoprazole (PROTONIX) 40 MG tablet, Take 1  tablet (40 mg total) by mouth 2 (two) times daily., Disp: 30 tablet, Rfl: 1 .  Pseudoeph-Doxylamine-DM-APAP (NYQUIL PO), Take 2 capsules by mouth daily as needed (sleep/cold symptoms)., Disp: , Rfl:  .  tadalafil (ADCIRCA/CIALIS) 20 MG tablet, Take 20 mg by mouth every evening. , Disp: , Rfl:  .  traMADol (ULTRAM) 50 MG tablet, Take 1 tablet (50 mg total) by mouth every 6 (six) hours as needed., Disp: 28 tablet, Rfl: 0

## 2018-04-28 DIAGNOSIS — I255 Ischemic cardiomyopathy: Secondary | ICD-10-CM | POA: Diagnosis not present

## 2018-04-28 DIAGNOSIS — T8189XD Other complications of procedures, not elsewhere classified, subsequent encounter: Secondary | ICD-10-CM | POA: Diagnosis not present

## 2018-04-28 DIAGNOSIS — I080 Rheumatic disorders of both mitral and aortic valves: Secondary | ICD-10-CM | POA: Diagnosis not present

## 2018-04-28 DIAGNOSIS — I480 Paroxysmal atrial fibrillation: Secondary | ICD-10-CM | POA: Diagnosis not present

## 2018-04-28 DIAGNOSIS — I9589 Other hypotension: Secondary | ICD-10-CM | POA: Diagnosis not present

## 2018-04-28 DIAGNOSIS — I700 Atherosclerosis of aorta: Secondary | ICD-10-CM | POA: Diagnosis not present

## 2018-04-28 DIAGNOSIS — D631 Anemia in chronic kidney disease: Secondary | ICD-10-CM | POA: Diagnosis not present

## 2018-04-28 DIAGNOSIS — I120 Hypertensive chronic kidney disease with stage 5 chronic kidney disease or end stage renal disease: Secondary | ICD-10-CM | POA: Diagnosis not present

## 2018-04-28 DIAGNOSIS — N186 End stage renal disease: Secondary | ICD-10-CM | POA: Diagnosis not present

## 2018-04-29 ENCOUNTER — Ambulatory Visit (INDEPENDENT_AMBULATORY_CARE_PROVIDER_SITE_OTHER): Payer: Self-pay | Admitting: Vascular Surgery

## 2018-04-29 ENCOUNTER — Other Ambulatory Visit: Payer: Self-pay

## 2018-04-29 ENCOUNTER — Encounter: Payer: Self-pay | Admitting: Vascular Surgery

## 2018-04-29 VITALS — BP 88/59 | HR 78 | Temp 97.0°F | Resp 18 | Ht 73.0 in | Wt 222.6 lb

## 2018-04-29 DIAGNOSIS — S71102D Unspecified open wound, left thigh, subsequent encounter: Secondary | ICD-10-CM

## 2018-04-29 NOTE — Progress Notes (Signed)
Patient is a 57 year old male who returns for follow-up today.  He is undergone multiple debridements after an excisional debridement of a nonhealing wound in the left leg.  He is currently using Dakin's solution on the left leg wound.  He underwent debridement in August x2 and September x1.  Of note he also is having intermittent difficulties with his left side hero graft.  He was requesting whether or not he might be a candidate for a new access in the right arm.  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  I discussed with him today that he would only be a candidate possibly for a right arm hero graft and I would not consider placing a new hero graft unless the left one fails.  We also discussed the possibility of a right thigh graft and I would not consider this until his left leg wound has completely healed.  He is also a little bit reluctant to proceed with a right thigh graft.  Physical exam:  Vitals:   04/29/18 1225  BP: (!) 88/59  Pulse: 78  Resp: 18  Temp: (!) 97 F (36.1 C)  TempSrc: Oral  SpO2: 97%  Weight: 222 lb 9.6 oz (101 kg)  Height: 6\' 1"  (1.854 m)    Extremities: Left leg 5 x 2.5 cm less than 1 mm depth with 100% granulation tissue left inner anterior thigh  Cardiac: Regular rate and rhythm 2+ right brachial and radial pulse  Assessment: Slowly healing left thigh wound  Plan: The patient will continue his Dakin's solution and follow-up in 4 to 6 weeks for a repeat wound check.  [name], MD [facility] Office: [phone] Pager: [phone]

## 2018-04-30 ENCOUNTER — Encounter: Payer: Self-pay | Admitting: Family Medicine

## 2018-05-04 ENCOUNTER — Ambulatory Visit: Payer: Medicare HMO | Admitting: Vascular Surgery

## 2018-05-06 DIAGNOSIS — I120 Hypertensive chronic kidney disease with stage 5 chronic kidney disease or end stage renal disease: Secondary | ICD-10-CM | POA: Diagnosis not present

## 2018-05-06 DIAGNOSIS — T8189XD Other complications of procedures, not elsewhere classified, subsequent encounter: Secondary | ICD-10-CM | POA: Diagnosis not present

## 2018-05-06 DIAGNOSIS — I9589 Other hypotension: Secondary | ICD-10-CM | POA: Diagnosis not present

## 2018-05-06 DIAGNOSIS — I080 Rheumatic disorders of both mitral and aortic valves: Secondary | ICD-10-CM | POA: Diagnosis not present

## 2018-05-06 DIAGNOSIS — D631 Anemia in chronic kidney disease: Secondary | ICD-10-CM | POA: Diagnosis not present

## 2018-05-06 DIAGNOSIS — I480 Paroxysmal atrial fibrillation: Secondary | ICD-10-CM | POA: Diagnosis not present

## 2018-05-06 DIAGNOSIS — I700 Atherosclerosis of aorta: Secondary | ICD-10-CM | POA: Diagnosis not present

## 2018-05-06 DIAGNOSIS — N186 End stage renal disease: Secondary | ICD-10-CM | POA: Diagnosis not present

## 2018-05-06 DIAGNOSIS — I255 Ischemic cardiomyopathy: Secondary | ICD-10-CM | POA: Diagnosis not present

## 2018-05-11 DIAGNOSIS — Z992 Dependence on renal dialysis: Secondary | ICD-10-CM | POA: Diagnosis not present

## 2018-05-11 DIAGNOSIS — E1129 Type 2 diabetes mellitus with other diabetic kidney complication: Secondary | ICD-10-CM | POA: Diagnosis not present

## 2018-05-11 DIAGNOSIS — N186 End stage renal disease: Secondary | ICD-10-CM | POA: Diagnosis not present

## 2018-05-12 DIAGNOSIS — N186 End stage renal disease: Secondary | ICD-10-CM | POA: Diagnosis not present

## 2018-05-12 DIAGNOSIS — Z992 Dependence on renal dialysis: Secondary | ICD-10-CM | POA: Diagnosis not present

## 2018-05-12 DIAGNOSIS — E1129 Type 2 diabetes mellitus with other diabetic kidney complication: Secondary | ICD-10-CM | POA: Diagnosis not present

## 2018-05-13 DIAGNOSIS — N2589 Other disorders resulting from impaired renal tubular function: Secondary | ICD-10-CM | POA: Diagnosis not present

## 2018-05-13 DIAGNOSIS — N2581 Secondary hyperparathyroidism of renal origin: Secondary | ICD-10-CM | POA: Diagnosis not present

## 2018-05-13 DIAGNOSIS — I8312 Varicose veins of left lower extremity with inflammation: Secondary | ICD-10-CM | POA: Diagnosis not present

## 2018-05-13 DIAGNOSIS — D631 Anemia in chronic kidney disease: Secondary | ICD-10-CM | POA: Diagnosis not present

## 2018-05-13 DIAGNOSIS — T82868A Thrombosis of vascular prosthetic devices, implants and grafts, initial encounter: Secondary | ICD-10-CM | POA: Diagnosis not present

## 2018-05-13 DIAGNOSIS — I739 Peripheral vascular disease, unspecified: Secondary | ICD-10-CM | POA: Diagnosis not present

## 2018-05-13 DIAGNOSIS — I8311 Varicose veins of right lower extremity with inflammation: Secondary | ICD-10-CM | POA: Diagnosis not present

## 2018-05-13 DIAGNOSIS — E785 Hyperlipidemia, unspecified: Secondary | ICD-10-CM | POA: Diagnosis not present

## 2018-05-13 DIAGNOSIS — I87393 Chronic venous hypertension (idiopathic) with other complications of bilateral lower extremity: Secondary | ICD-10-CM | POA: Diagnosis not present

## 2018-05-13 DIAGNOSIS — N186 End stage renal disease: Secondary | ICD-10-CM | POA: Diagnosis not present

## 2018-05-13 DIAGNOSIS — E7849 Other hyperlipidemia: Secondary | ICD-10-CM | POA: Diagnosis not present

## 2018-05-13 DIAGNOSIS — Z992 Dependence on renal dialysis: Secondary | ICD-10-CM | POA: Diagnosis not present

## 2018-05-13 DIAGNOSIS — K7689 Other specified diseases of liver: Secondary | ICD-10-CM | POA: Diagnosis not present

## 2018-05-13 DIAGNOSIS — I871 Compression of vein: Secondary | ICD-10-CM | POA: Diagnosis not present

## 2018-05-13 DIAGNOSIS — D509 Iron deficiency anemia, unspecified: Secondary | ICD-10-CM | POA: Diagnosis not present

## 2018-05-13 DIAGNOSIS — D511 Vitamin B12 deficiency anemia due to selective vitamin B12 malabsorption with proteinuria: Secondary | ICD-10-CM | POA: Diagnosis not present

## 2018-05-14 DIAGNOSIS — K7689 Other specified diseases of liver: Secondary | ICD-10-CM | POA: Diagnosis not present

## 2018-05-14 DIAGNOSIS — N2589 Other disorders resulting from impaired renal tubular function: Secondary | ICD-10-CM | POA: Diagnosis not present

## 2018-05-14 DIAGNOSIS — D631 Anemia in chronic kidney disease: Secondary | ICD-10-CM | POA: Diagnosis not present

## 2018-05-14 DIAGNOSIS — D509 Iron deficiency anemia, unspecified: Secondary | ICD-10-CM | POA: Diagnosis not present

## 2018-05-14 DIAGNOSIS — E7849 Other hyperlipidemia: Secondary | ICD-10-CM | POA: Diagnosis not present

## 2018-05-14 DIAGNOSIS — N2581 Secondary hyperparathyroidism of renal origin: Secondary | ICD-10-CM | POA: Diagnosis not present

## 2018-05-14 DIAGNOSIS — D511 Vitamin B12 deficiency anemia due to selective vitamin B12 malabsorption with proteinuria: Secondary | ICD-10-CM | POA: Diagnosis not present

## 2018-05-14 DIAGNOSIS — N186 End stage renal disease: Secondary | ICD-10-CM | POA: Diagnosis not present

## 2018-05-14 DIAGNOSIS — E785 Hyperlipidemia, unspecified: Secondary | ICD-10-CM | POA: Diagnosis not present

## 2018-05-16 DIAGNOSIS — T829XXA Unspecified complication of cardiac and vascular prosthetic device, implant and graft, initial encounter: Secondary | ICD-10-CM | POA: Diagnosis not present

## 2018-05-16 DIAGNOSIS — A419 Sepsis, unspecified organism: Secondary | ICD-10-CM | POA: Diagnosis not present

## 2018-05-16 DIAGNOSIS — N186 End stage renal disease: Secondary | ICD-10-CM | POA: Diagnosis not present

## 2018-05-16 DIAGNOSIS — Z95810 Presence of automatic (implantable) cardiac defibrillator: Secondary | ICD-10-CM | POA: Diagnosis not present

## 2018-05-16 DIAGNOSIS — T8189XA Other complications of procedures, not elsewhere classified, initial encounter: Secondary | ICD-10-CM | POA: Diagnosis not present

## 2018-05-16 DIAGNOSIS — J9601 Acute respiratory failure with hypoxia: Secondary | ICD-10-CM | POA: Diagnosis not present

## 2018-05-16 DIAGNOSIS — I6529 Occlusion and stenosis of unspecified carotid artery: Secondary | ICD-10-CM | POA: Diagnosis not present

## 2018-05-16 DIAGNOSIS — E669 Obesity, unspecified: Secondary | ICD-10-CM | POA: Diagnosis not present

## 2018-05-16 DIAGNOSIS — R7881 Bacteremia: Secondary | ICD-10-CM | POA: Diagnosis not present

## 2018-05-16 DIAGNOSIS — T8130XA Disruption of wound, unspecified, initial encounter: Secondary | ICD-10-CM | POA: Diagnosis not present

## 2018-05-17 DIAGNOSIS — E785 Hyperlipidemia, unspecified: Secondary | ICD-10-CM | POA: Diagnosis not present

## 2018-05-17 DIAGNOSIS — E7849 Other hyperlipidemia: Secondary | ICD-10-CM | POA: Diagnosis not present

## 2018-05-17 DIAGNOSIS — N2581 Secondary hyperparathyroidism of renal origin: Secondary | ICD-10-CM | POA: Diagnosis not present

## 2018-05-17 DIAGNOSIS — D511 Vitamin B12 deficiency anemia due to selective vitamin B12 malabsorption with proteinuria: Secondary | ICD-10-CM | POA: Diagnosis not present

## 2018-05-17 DIAGNOSIS — Z4800 Encounter for change or removal of nonsurgical wound dressing: Secondary | ICD-10-CM | POA: Diagnosis not present

## 2018-05-17 DIAGNOSIS — N186 End stage renal disease: Secondary | ICD-10-CM | POA: Diagnosis not present

## 2018-05-17 DIAGNOSIS — D631 Anemia in chronic kidney disease: Secondary | ICD-10-CM | POA: Diagnosis not present

## 2018-05-17 DIAGNOSIS — D509 Iron deficiency anemia, unspecified: Secondary | ICD-10-CM | POA: Diagnosis not present

## 2018-05-17 DIAGNOSIS — K7689 Other specified diseases of liver: Secondary | ICD-10-CM | POA: Diagnosis not present

## 2018-05-17 DIAGNOSIS — N2589 Other disorders resulting from impaired renal tubular function: Secondary | ICD-10-CM | POA: Diagnosis not present

## 2018-05-18 ENCOUNTER — Ambulatory Visit: Payer: Medicare HMO | Admitting: Family Medicine

## 2018-05-18 ENCOUNTER — Telehealth: Payer: Self-pay

## 2018-05-18 ENCOUNTER — Other Ambulatory Visit (HOSPITAL_COMMUNITY): Payer: Self-pay | Admitting: Nephrology

## 2018-05-18 ENCOUNTER — Other Ambulatory Visit: Payer: Self-pay | Admitting: Physician Assistant

## 2018-05-18 DIAGNOSIS — Z992 Dependence on renal dialysis: Principal | ICD-10-CM

## 2018-05-18 DIAGNOSIS — D631 Anemia in chronic kidney disease: Secondary | ICD-10-CM | POA: Diagnosis not present

## 2018-05-18 DIAGNOSIS — D511 Vitamin B12 deficiency anemia due to selective vitamin B12 malabsorption with proteinuria: Secondary | ICD-10-CM | POA: Diagnosis not present

## 2018-05-18 DIAGNOSIS — N2581 Secondary hyperparathyroidism of renal origin: Secondary | ICD-10-CM | POA: Diagnosis not present

## 2018-05-18 DIAGNOSIS — E785 Hyperlipidemia, unspecified: Secondary | ICD-10-CM | POA: Diagnosis not present

## 2018-05-18 DIAGNOSIS — N2589 Other disorders resulting from impaired renal tubular function: Secondary | ICD-10-CM | POA: Diagnosis not present

## 2018-05-18 DIAGNOSIS — N186 End stage renal disease: Secondary | ICD-10-CM | POA: Diagnosis not present

## 2018-05-18 DIAGNOSIS — D509 Iron deficiency anemia, unspecified: Secondary | ICD-10-CM | POA: Diagnosis not present

## 2018-05-18 DIAGNOSIS — K7689 Other specified diseases of liver: Secondary | ICD-10-CM | POA: Diagnosis not present

## 2018-05-18 DIAGNOSIS — E7849 Other hyperlipidemia: Secondary | ICD-10-CM | POA: Diagnosis not present

## 2018-05-18 NOTE — Telephone Encounter (Signed)
Denise from [facility] called regarding pt's Hero graft needing to be declotted. Recommended he come in to see Dr. [name] based on previous appt's note and in the interim to call Dr. Augustin Coupe at [facility] or IR. Denise verbalized understanding. No further questions at this time.

## 2018-05-19 ENCOUNTER — Telehealth: Payer: Self-pay | Admitting: Vascular Surgery

## 2018-05-19 ENCOUNTER — Encounter (HOSPITAL_COMMUNITY): Payer: Self-pay

## 2018-05-19 ENCOUNTER — Ambulatory Visit (HOSPITAL_COMMUNITY)
Admission: RE | Admit: 2018-05-19 | Discharge: 2018-05-19 | Disposition: A | Discharge status: Home / Self Care | Payer: Medicare HMO | Source: Ambulatory Visit | Attending: Nephrology | Admitting: Nephrology

## 2018-05-19 ENCOUNTER — Other Ambulatory Visit (HOSPITAL_COMMUNITY): Payer: Self-pay | Admitting: Nephrology

## 2018-05-19 DIAGNOSIS — Y832 Surgical operation with anastomosis, bypass or graft as the cause of abnormal reaction of the patient, or of later complication, without mention of misadventure at the time of the procedure: Secondary | ICD-10-CM | POA: Diagnosis not present

## 2018-05-19 DIAGNOSIS — E785 Hyperlipidemia, unspecified: Secondary | ICD-10-CM | POA: Diagnosis not present

## 2018-05-19 DIAGNOSIS — Z888 Allergy status to other drugs, medicaments and biological substances status: Secondary | ICD-10-CM | POA: Diagnosis not present

## 2018-05-19 DIAGNOSIS — N2581 Secondary hyperparathyroidism of renal origin: Secondary | ICD-10-CM | POA: Diagnosis not present

## 2018-05-19 DIAGNOSIS — I428 Other cardiomyopathies: Secondary | ICD-10-CM | POA: Diagnosis not present

## 2018-05-19 DIAGNOSIS — Z89421 Acquired absence of other right toe(s): Secondary | ICD-10-CM | POA: Insufficient documentation

## 2018-05-19 DIAGNOSIS — N186 End stage renal disease: Secondary | ICD-10-CM | POA: Diagnosis not present

## 2018-05-19 DIAGNOSIS — I480 Paroxysmal atrial fibrillation: Secondary | ICD-10-CM | POA: Insufficient documentation

## 2018-05-19 DIAGNOSIS — E7849 Other hyperlipidemia: Secondary | ICD-10-CM | POA: Diagnosis not present

## 2018-05-19 DIAGNOSIS — M170 Bilateral primary osteoarthritis of knee: Secondary | ICD-10-CM | POA: Diagnosis not present

## 2018-05-19 DIAGNOSIS — I272 Pulmonary hypertension, unspecified: Secondary | ICD-10-CM | POA: Insufficient documentation

## 2018-05-19 DIAGNOSIS — Z992 Dependence on renal dialysis: Secondary | ICD-10-CM | POA: Insufficient documentation

## 2018-05-19 DIAGNOSIS — N2589 Other disorders resulting from impaired renal tubular function: Secondary | ICD-10-CM | POA: Diagnosis not present

## 2018-05-19 DIAGNOSIS — Z940 Kidney transplant status: Secondary | ICD-10-CM | POA: Insufficient documentation

## 2018-05-19 DIAGNOSIS — Z89422 Acquired absence of other left toe(s): Secondary | ICD-10-CM | POA: Insufficient documentation

## 2018-05-19 DIAGNOSIS — I120 Hypertensive chronic kidney disease with stage 5 chronic kidney disease or end stage renal disease: Secondary | ICD-10-CM | POA: Diagnosis not present

## 2018-05-19 DIAGNOSIS — I080 Rheumatic disorders of both mitral and aortic valves: Secondary | ICD-10-CM | POA: Diagnosis not present

## 2018-05-19 DIAGNOSIS — Z841 Family history of disorders of kidney and ureter: Secondary | ICD-10-CM | POA: Diagnosis not present

## 2018-05-19 DIAGNOSIS — D631 Anemia in chronic kidney disease: Secondary | ICD-10-CM | POA: Diagnosis not present

## 2018-05-19 DIAGNOSIS — T82858A Stenosis of vascular prosthetic devices, implants and grafts, initial encounter: Secondary | ICD-10-CM | POA: Diagnosis not present

## 2018-05-19 DIAGNOSIS — Z9884 Bariatric surgery status: Secondary | ICD-10-CM | POA: Insufficient documentation

## 2018-05-19 DIAGNOSIS — T82868A Thrombosis of vascular prosthetic devices, implants and grafts, initial encounter: Secondary | ICD-10-CM | POA: Insufficient documentation

## 2018-05-19 DIAGNOSIS — D511 Vitamin B12 deficiency anemia due to selective vitamin B12 malabsorption with proteinuria: Secondary | ICD-10-CM | POA: Diagnosis not present

## 2018-05-19 DIAGNOSIS — Z79890 Hormone replacement therapy (postmenopausal): Secondary | ICD-10-CM | POA: Diagnosis not present

## 2018-05-19 DIAGNOSIS — Z79899 Other long term (current) drug therapy: Secondary | ICD-10-CM | POA: Diagnosis not present

## 2018-05-19 DIAGNOSIS — Z8249 Family history of ischemic heart disease and other diseases of the circulatory system: Secondary | ICD-10-CM | POA: Diagnosis not present

## 2018-05-19 DIAGNOSIS — E039 Hypothyroidism, unspecified: Secondary | ICD-10-CM | POA: Insufficient documentation

## 2018-05-19 DIAGNOSIS — D509 Iron deficiency anemia, unspecified: Secondary | ICD-10-CM | POA: Diagnosis not present

## 2018-05-19 DIAGNOSIS — K7689 Other specified diseases of liver: Secondary | ICD-10-CM | POA: Diagnosis not present

## 2018-05-19 HISTORY — PX: IR RADIOLOGY PERIPHERAL GUIDED IV START: IMG5598

## 2018-05-19 HISTORY — PX: IR US GUIDE VASC ACCESS LEFT: IMG2389

## 2018-05-19 HISTORY — PX: IR US GUIDE VASC ACCESS RIGHT: IMG2390

## 2018-05-19 HISTORY — PX: IR THROMBECTOMY AV FISTULA W/THROMBOLYSIS/PTA INC/SHUNT/IMG LEFT: IMG6106

## 2018-05-19 LAB — BASIC METABOLIC PANEL
ANION GAP: 19 — AB (ref 5–15)
BUN: 83 mg/dL — ABNORMAL HIGH (ref 6–20)
CO2: 20 mmol/L — ABNORMAL LOW (ref 22–32)
Calcium: 8.9 mg/dL (ref 8.9–10.3)
Chloride: 95 mmol/L — ABNORMAL LOW (ref 98–111)
Creatinine, Ser: 11.54 mg/dL — ABNORMAL HIGH (ref 0.61–1.24)
GFR calc Af Amer: 5 mL/min — ABNORMAL LOW (ref >60)
GFR calc non Af Amer: 4 mL/min — ABNORMAL LOW (ref >60)
Glucose, Bld: 114 mg/dL — ABNORMAL HIGH (ref 70–99)
Potassium: 5.7 mmol/L — ABNORMAL HIGH (ref 3.5–5.1)
Sodium: 134 mmol/L — ABNORMAL LOW (ref 135–145)

## 2018-05-19 LAB — PROTIME-INR
INR: 1.11
Prothrombin Time: 14.2 seconds (ref 11.4–15.2)

## 2018-05-19 MED ORDER — HEPARIN SODIUM (PORCINE) 1000 UNIT/ML IJ SOLN
INTRAMUSCULAR | Status: AC
  Filled 2018-05-19: qty 1

## 2018-05-19 MED ORDER — LIDOCAINE HCL 1 % IJ SOLN
INTRAMUSCULAR | Status: AC
  Filled 2018-05-19: qty 20

## 2018-05-19 MED ORDER — IOPAMIDOL (ISOVUE-300) INJECTION 61%
INTRAVENOUS | Status: AC
  Administered 2018-05-19: 50 mL
  Filled 2018-05-19: qty 100

## 2018-05-19 MED ORDER — ALTEPLASE 2 MG IJ SOLR
INTRAMUSCULAR | Status: AC | PRN
  Administered 2018-05-19: 2 mg

## 2018-05-19 MED ORDER — LIDOCAINE HCL 1 % IJ SOLN
INTRAMUSCULAR | Status: AC | PRN
  Administered 2018-05-19: 10 mL

## 2018-05-19 MED ORDER — SODIUM CHLORIDE 0.9 % IV SOLN: INTRAVENOUS | Status: DC

## 2018-05-19 MED ORDER — HEPARIN SODIUM (PORCINE) 1000 UNIT/ML IJ SOLN
INTRAMUSCULAR | Status: AC | PRN
  Administered 2018-05-19: 3000 [IU] via INTRAVENOUS

## 2018-05-19 MED ORDER — ALTEPLASE 2 MG IJ SOLR
INTRAMUSCULAR | Status: AC
  Filled 2018-05-19: qty 2

## 2018-05-19 NOTE — Discharge Instructions (Addendum)
Dialysis Vascular Access Malfunction        A vascular access is an entrance to your blood vessels that can be used for dialysis. Dialysis is a treatment used for kidney failure. A health care provider can make a vascular access in many ways, such as by:  Joining an artery to a vein under your skin to make a bigger blood vessel called a fistula.  Joining an artery to a vein under your skin using a soft tube called a graft.  Placing a thin, flexible tube (catheter) in a large vein, usually in your neck, chest, or groin. A vascular access can become blocked or stop working correctly (malfunction). What can cause my vascular access to malfunction?  Infection. This is the most common cause of malfunction.  A blood clot inside a part of the fistula, graft, or catheter. A blood clot can completely or partially block the flow of blood.  A kink in the graft or catheter.  A collection of blood (hematoma or bruise) next to the graft or catheter that pushes against it, blocking the flow of blood. What are signs and symptoms of vascular access malfunction? Signs and symptoms of vascular access malfunction include:  A change in the vibration or pulse (thrill) of your fistula or graft.  The thrill of your fistula or graft being gone.  New or unusual swelling of the area around the access.  An unsuccessful puncture of your access by the dialysis team.  The flow of blood through the fistula, graft, or catheter being too slow for effective dialysis.  Bleeding that cannot be easily controlled when routine dialysis is completed and the needle is removed.  Signs of infection such as pain, swelling, redness, red streaks, numbness, and blood or pus coming from or around the access. What happens if my vascular access malfunctions? If your vascular access malfunctions, your health care provider may order blood work, cultures, or an X-ray test to find out what went wrong. The X-ray test involves the  injection of a dye (contrast) into the vascular access. The contrast shows up on the X-ray and lets your health care provider see if there is a blockage in the vascular access. Treatment varies depending on the cause of the malfunction:  If the vascular access is infected, your health care provider may prescribe antibiotic medicine to control the infection.  If a clot is found in the vascular access, you may need surgery to remove the clot. Blood thinning medicines may also be used.  If a blockage in the vascular access is due to some other cause, such as a kink in a graft, you will likely need surgery to unblock or replace the graft.  If there is a malfunction for any reason, your health care provider may remove and replace your access. Follow these instructions at home: Medicines  Take over-the-counter and prescription medicines only as told by your health care provider.  If you were prescribed an antibiotic medicine, use it as told by your health care provider. Do not stop using the antibiotic even if you start to feel better. Activity  Do not lift heavy things with the arm that has the access.  Try not to bump or hurt the arm that has the access. Lifestyle  Do not wear jewelry or tight clothing around the access.  Do not sleep by placing the access arm under your head or body. General instructions  Wash your hands with soap and water before touching the area around the   vascular access. If soap and water are not available, use hand sanitizer.  Keep all follow-up visits as told by your health care provider. This is very important. Any delay in follow-up could cause permanent dysfunction of the vascular access, which may be dangerous.  Do not measure your blood pressure on the arm that has the access.  Keep the access area clean and do not use makeup, creams, lotions, or ointments on it.  Check your access area every day for signs of infection. Check for: ? More redness,  swelling, or pain. ? More fluid or blood. ? Warmth. ? Pus or a bad smell. Contact a health care provider if:  Swelling around the vascular access gets worse.  You develop new pain. Get help right away if:  You have bleeding at the vascular access that cannot be easily controlled.  You have pain, numbness, an unusual pale skin color, or blue fingers or sores at the tips of your fingers in the hand on the side of your fistula.  You have chills.  You have a fever.  You have pus or other fluid (drainage) at the vascular access site.  You develop skin redness or red streaking on the skin around, above, or below the vascular access.  Your access is hot, swollen, red, and very painful.  You can suddenly see the cuff of your catheter used in the access. Summary  A vascular access is an entrance to your blood vessels that can be used for dialysis.  Several things can cause your vascular access to malfunction.  Treatment varies depending on the cause of the malfunction.  Keep all follow-up visits as told by your health care provider. Any delay in follow-up could cause permanent dysfunction of the vascular access, which may be dangerous. This information is not intended to replace advice given to you by your health care provider. Make sure you discuss any questions you have with your health care provider. Document Released: 03/31/2006 Document Revised: 05/23/2016 Document Reviewed: 05/23/2016 Elsevier Interactive Patient Education  2019 [place]. Moderate Conscious Sedation, Adult, Care After These instructions provide you with information about caring for yourself after your procedure. Your health care provider may also give you more specific instructions. Your treatment has been planned according to current medical practices, but problems sometimes occur. Call your health care provider if you have any problems or questions after your procedure. What can I expect after the  procedure? After your procedure, it is common:  To feel sleepy for several hours.  To feel clumsy and have poor balance for several hours.  To have poor judgment for several hours.  To vomit if you eat too soon. Follow these instructions at home: For at least 24 hours after the procedure:   Do not: ? Participate in activities where you could fall or become injured. ? Drive. ? Use heavy machinery. ? Drink alcohol. ? Take sleeping pills or medicines that cause drowsiness. ? Make important decisions or sign legal documents. ? Take care of children on your own.  Rest. Eating and drinking  Follow the diet recommended by your health care provider.  If you vomit: ? Drink water, juice, or soup when you can drink without vomiting. ? Make sure you have little or no nausea before eating solid foods. General instructions  Have a responsible adult stay with you until you are awake and alert.  Take over-the-counter and prescription medicines only as told by your health care provider.  If you smoke, do not  smoke without supervision.  Keep all follow-up visits as told by your health care provider. This is important. Contact a health care provider if:  You keep feeling nauseous or you keep vomiting.  You feel light-headed.  You develop a rash.  You have a fever. Get help right away if:  You have trouble breathing. This information is not intended to replace advice given to you by your health care provider. Make sure you discuss any questions you have with your health care provider. Document Released: 02/16/2013 Document Revised: 10/01/2015 Document Reviewed: 08/18/2015 Elsevier Interactive Patient Education  2019 [employer].

## 2018-05-19 NOTE — Telephone Encounter (Signed)
-----   Message from Waynetta Sandy, MD sent at 05/19/2018  9:46 AM EST ----- Adam [name] [id number] 02/10/1961  I need to see this patient this week if possible without studies at the request of Dr. Otelia Santee. Ok to [place].   Erlene Quan

## 2018-05-19 NOTE — H&P (Signed)
Chief Complaint: Patient was seen in consultation today for AVG declot at the request of Sanford,Ryan B  Referring Physician(s): Rincon B  Supervising Physician: Markus Daft  Patient Status: [facility] - Out-pt  History of Present Illness: Adam Bushong. is a 58 y.o. male with ESRD. He gets HD via (L)UE AV Hero graft that was placed about 3 months ago. He has had issues with it ever since placement. He was clotted last week and they were able to declot it at [facility] last week and he was able to have 2 dialysis sessions. However, he went in yesterday and it was found to be clotted again. He is referred for declot procedure, possible HD catheter. PMHx, meds, labs, imaging, allergies reviewed. Feels well, no recent fevers, chills, illness. Has been NPO today as directed.   Past Medical History:  Diagnosis Date  . Abscess 02/2018   LEFT THIGH   . AICD (automatic cardioverter/defibrillator) present    [employer]tr[employer] - s/p extraction 08/21/17 due to staph infection  . Anemia   . Aortic stenosis    moderate AS by 08/2016 echo  . Arthritis    "knees" (08/05/2016)  . Atrial fibrillation ([place])   . ESRD (end stage renal disease) on dialysis [facility])    ESRD due to HTN started HD 2003, had renal Tx 2009 for 18 mos. Was on NX stage hemodialysis as of 2018 but in late 2019 is back at [facility] incenter HD.   Marland Kitchen Great toe amputation status    status post left hallux amputation 08/01/14  . History of blood transfusion 2009   "S/P biopsy for prostate cancer check"  . Hypothyroidism (acquired)   . Mitral stenosis    moderate mitral stenosis  . Nonhealing surgical wound    left thigh  . Nonischemic cardiomyopathy ([place])   . Pneumonia 2014; 08/05/2016  . Pulmonary HTN ([place])   . Renal insufficiency   . Thyroid cancer ([place]) 2011    Past Surgical History:  Procedure Laterality Date  . A/V SHUNTOGRAM N/A 02/25/2018   Procedure: A/V SHUNTOGRAM;  Surgeon: Marty Heck, MD;  Location:  [place] [facility];  Service: Cardiovascular;  Laterality: N/A;  lt arm fistula  . ARTERIAL LINE INSERTION Right 08/24/2017   Procedure: ARTERIAL LINE INSERTION INTO RIGHT FEMORAL ARTERY;  Surgeon: Conrad [place], MD;  Location: [place];  Service: Vascular;  Laterality: Right;  . BASCILIC VEIN TRANSPOSITION Right 10/13/2017   Procedure: FIRST STAGE BRACHIAL VEIN TRANSPOSITION RIGHT ARM;  Surgeon: Conrad Jeanerette, MD;  Location: [place];  Service: Vascular;  Laterality: Right;  . BIOPSY  09/25/2017   Procedure: BIOPSY;  Surgeon: Ronnette Juniper, MD;  Location: [facility] ENDOSCOPY;  Service: Gastroenterology;;  . CARDIAC CATHETERIZATION Right 2017  . CARDIAC DEFIBRILLATOR PLACEMENT  2009  . DIALYSIS FISTULA CREATION Left 2003   "upper arm"  . DIALYSIS/PERMA CATHETER REMOVAL Left 09/01/2017   Procedure: DIALYSIS/PERMA CATHETER REMOVAL;  Surgeon: Conrad [place], MD;  Location: [place];  Service: Vascular;  Laterality: Left;  . ESOPHAGOGASTRODUODENOSCOPY (EGD) WITH PROPOFOL N/A 09/25/2017   Procedure: ESOPHAGOGASTRODUODENOSCOPY (EGD) WITH PROPOFOL;  Surgeon: Ronnette Juniper, MD;  Location: [place];  Service: Gastroenterology;  Laterality: N/A;  . HERNIA REPAIR    . I&D EXTREMITY Right 11/06/2017   Procedure: IRRIGATION AND DEBRIDEMENT KNEE;  Surgeon: Rod Can, MD;  Location: [place];  Service: Orthopedics;  Laterality: Right;  . I&D EXTREMITY Left 01/03/2018   Procedure: IRRIGATION AND DEBRIDEMENT OF THIGH, wound vac application  wound size 10cm x 2.5cm x 2.5cm;  Surgeon: Waynetta Sandy, MD;  Location: [place];  Service: Vascular;  Laterality: Left;  . ICD LEAD REMOVAL Right 08/20/2017   Procedure: ICD EXTRACTION WITH C-ARM;  Surgeon: Evans Lance, MD;  Location: [facility] OR;  Service: Cardiovascular;  Laterality: Right;  DR VAN TRIGT TO BACK-UP  . icd removed    . INSERTION OF DIALYSIS CATHETER N/A 11/11/2016   Procedure: INSERTION OF TUNNELED DIALYSIS CATHETER;  Surgeon: Angelia Mould, MD;  Location: [place];  Service: Vascular;  Laterality: N/A;  . INSERTION OF DIALYSIS CATHETER Left 08/24/2017   Procedure: INSERTION OF DIALYSIS CATHETER;  Surgeon: Conrad [place], MD;  Location: [place];  Service: Vascular;  Laterality: Left;  . INSERTION OF DIALYSIS CATHETER Left 09/01/2017   Procedure: INSERTION OF DIALYSIS CATHETER;  Surgeon: Conrad [place], MD;  Location: [place];  Service: Vascular;  Laterality: Left;  . KIDNEY TRANSPLANT  2009  . KNEE SURGERY Bilateral 2000s   "drained fluid"  . LAPAROSCOPIC GASTRIC BANDING  2006  . LIGATION OF ARTERIOVENOUS  FISTULA Right 11/10/2017   Procedure: LIGATION OF BRACHIAL VEIN TRANSPOSITION RIGHT ARM;  Surgeon: Angelia Mould, MD;  Location: [place];  Service: Vascular;  Laterality: Right;  . MASS EXCISION N/A 01/12/2018   Procedure: EXCISION OF MULTIPLE SUBCUTANEOUS MASSES;  Surgeon: Ralene Ok, MD;  Location: [place];  Service: General;  Laterality: N/A;  . PROSTATE BIOPSY  2009  . REVISON OF ARTERIOVENOUS FISTULA Left 1/75/1025   Procedure: PLICATION OF ARTERIOVENOUS FISTULA ANEURYSM;  Surgeon: Elam [place], MD;  Location: [place];  Service: Vascular;  Laterality: Left;  . REVISON OF ARTERIOVENOUS FISTULA Left 11/10/2016   Procedure: REVISON OF ARTERIOVENOUS FISTULA - LEFT UPPER ARM;  Surgeon: Elam [place], MD;  Location: [place];  Service: Vascular;  Laterality: Left;  . REVISON OF ARTERIOVENOUS FISTULA Left 08/24/2017   Procedure: REVISON OF ARTERIOVENOUS FISTULA LEFT;  Surgeon: Conrad [place], MD;  Location: [place];  Service: Vascular;  Laterality: Left;  . TEE WITHOUT CARDIOVERSION N/A 08/20/2017   Procedure: TRANSESOPHAGEAL ECHOCARDIOGRAM (TEE);  Surgeon: Evans Lance, MD;  Location: [place];  Service: Cardiovascular;  Laterality: N/A;  . THROMBECTOMY W/ EMBOLECTOMY Left 12/25/2014   Procedure: THROMBECTOMY ARTERIOVENOUS FISTULA;  Surgeon: Elam [place], MD;  Location: [place];  Service: Vascular;  Laterality: Left;  . THYROIDECTOMY  2011  . TOE  AMPUTATION Bilateral     right 1st and  2nd digits; left 1st and 3rd digits"  . UMBILICAL HERNIA REPAIR  X 2  . UPPER EXTREMITY VENOGRAPHY Right 11/05/2017   Procedure: UPPER EXTREMITY VENOGRAPHY;  Surgeon: Conrad Ainsworth, MD;  Location: [place] [facility];  Service: Cardiovascular;  Laterality: Right;  Marland Kitchen VASCULAR ACCESS DEVICE INSERTION Left 11/10/2017   Procedure: INSERTION OF SUPER HERO VASCULAR ACCESS DEVICE LEFT ARM;  Surgeon: Angelia Mould, MD;  Location: [place];  Service: Vascular;  Laterality: Left;  Marland Kitchen VEIN HARVEST Left 08/24/2017   Procedure: VEIN HARVEST;  Surgeon: Conrad [place], MD;  Location: [place];  Service: Vascular;  Laterality: Left;  . WOUND DEBRIDEMENT Left 12/22/2017   Procedure: EXCISION OF LEFT THIGH WOUND;  Surgeon: Elam [place], MD;  Location: [place];  Service: Vascular;  Laterality: Left;  . WOUND DEBRIDEMENT Left 01/28/2018   Procedure: DEBRIDEMENT WOUND LEFT THIGH;  Surgeon: Marty Heck, MD;  Location: [place];  Service: Vascular;  Laterality: Left;  . WOUND  DEBRIDEMENT Right 02/23/2018   Procedure: DEBRIDEMENT AND IRRIGATION OF RIGHT FLANK WOUND;  Surgeon: Ralene Ok, MD;  Location: [place];  Service: General;  Laterality: Right;    Allergies: Enalapril and Iodinated diagnostic agents  Medications: Prior to Admission medications   Medication Sig Start Date End Date Taking? Authorizing Provider  ambrisentan (LETAIRIS) 10 MG tablet Take 1 tablet (10 mg total) by mouth daily. Patient taking differently: Take 10 mg by mouth every evening.  09/11/17  Yes Juanito Doom, MD  amiodarone (PACERONE) 200 MG tablet Take 200 mg by mouth every evening.    Yes [provider]  atorvastatin (LIPITOR) 40 MG tablet Take 1 tablet (40 mg total) by mouth every evening. 12/11/17  Yes Diallo, Abdoulaye, MD  cinacalcet (SENSIPAR) 90 MG tablet Take 90 mg by mouth every evening.   Yes [provider]  levothyroxine (SYNTHROID, LEVOTHROID) 300 MCG tablet  Take 1 tablet (300 mcg total) by mouth daily before breakfast. 09/26/17  Yes Enid Derry, [place], DO  midodrine (PROAMATINE) 10 MG tablet TAKE 1 TABLET (10 MG TOTAL) BY MOUTH 3 (THREE) TIMES DAILY. 10/08/17  Yes Minus Breeding, MD  multivitamin (RENA-VIT) TABS tablet TAKE 1 TABLET BY MOUTH EVERYDAY AT BEDTIME Patient taking differently: Take 1 tablet by mouth at bedtime.  01/12/18  Yes Diallo, Abdoulaye, MD  nystatin (MYCOSTATIN/NYSTOP) powder Apply topically 4 (four) times daily. Patient taking differently: Apply 1 g topically daily as needed (rash).  11/18/17  Yes Diallo, Abdoulaye, MD  pantoprazole (PROTONIX) 40 MG tablet Take 1 tablet (40 mg total) by mouth 2 (two) times daily. 02/26/18 02/26/19 Yes Guadalupe Dawn, MD  Pseudoeph-Doxylamine-DM-APAP (NYQUIL PO) Take 2 capsules by mouth daily as needed (sleep/cold symptoms).   Yes [provider]  tadalafil (ADCIRCA/CIALIS) 20 MG tablet Take 20 mg by mouth every evening.  10/06/17  Yes [provider]  traMADol (ULTRAM) 50 MG tablet Take 1 tablet (50 mg total) by mouth every 6 (six) hours as needed. 03/23/18 03/23/19 Yes Marty Heck, MD  acetaminophen (TYLENOL) 500 MG tablet Take 1,000 mg by mouth daily as needed for moderate pain or headache.    [provider]  albuterol (PROVENTIL) (2.5 MG/3ML) 0.083% nebulizer solution Take 3 mLs (2.5 mg total) by nebulization every 6 (six) hours as needed for wheezing or shortness of breath. 03/17/18   Lauraine Rinne, NP     Family History  Problem Relation Age of Onset  . Heart failure Mother   . Hypertension Mother   . CAD Mother 22  . Emphysema Mother   . Hypertension Father   . Kidney failure Father     Social History   Socioeconomic History  . Marital status: Married    Spouse name: Not on file  . Number of children: 1  . Years of education: Not on file  . Highest education level: Not on file  Occupational History  . Occupation: Retired Designer, industrial/product from  [employer]  . Financial resource strain: Not on file  . Food insecurity:    Worry: Not on file    Inability: Not on file  . Transportation needs:    Medical: Not on file    Non-medical: Not on file  Tobacco Use  . Smoking status: Never Smoker  . Smokeless tobacco: Never Used  Substance and Sexual Activity  . Alcohol use: Yes    Alcohol/week: 0.0 standard drinks    Comment: social  . Drug use: No  . Sexual activity: Yes  Lifestyle  . Physical activity:    Days per week: Not on file    Minutes per session: Not on file  . Stress: Not on file  Relationships  . Social connections:    Talks on phone: Not on file    Gets together: Not on file    Attends religious service: Not on file    Active member of club or organization: Not on file    Attends meetings of clubs or organizations: Not on file    Relationship status: Not on file  Other Topics Concern  . Not on file  Social History Narrative   Retired Archivist.       Review of Systems: A 12 point ROS discussed and pertinent positives are indicated in the HPI above.  All other systems are negative.  Review of Systems  Vital Signs: BP 96/77   Pulse 73   Temp 97.7 F (36.5 C) (Oral)   Resp 16   Ht 6\' 1"  (1.854 m)   Wt 97.5 kg   SpO2 98%   BMI 28.37 kg/m   Physical Exam HENT:     Mouth/Throat:     Mouth: Mucous membranes are moist.     Pharynx: Oropharynx is clear.  Cardiovascular:     Rate and Rhythm: Normal rate and regular rhythm.     Heart sounds: Normal heart sounds.  Pulmonary:     Effort: Pulmonary effort is normal. No respiratory distress.     Breath sounds: Normal breath sounds.  Musculoskeletal:     Comments: (L)UE AVG palpable but no pulse/thrill Hand warm, good radial pulse  Neurological:     General: No focal deficit present.     Mental Status: He is alert and oriented to person, place, and time.  Psychiatric:        Mood and Affect: Mood normal.        Judgment:  Judgment normal.     Imaging: No results found.  Labs:  CBC: Recent Labs    03/27/18 1053 03/28/18 0454 03/28/18 2156 03/29/18 0530 04/21/18 1213  WBC 6.8 5.7  --  6.0 4.3  HGB 7.5* 7.1* 8.2* 8.6* 12.2*  HCT 24.8* 23.5* 27.8* 28.5* 41.6  PLT 112* 113*  --  105* 92*    COAGS: Recent Labs    12/12/17 1941 12/22/17 0856 01/12/18 0648 02/22/18 0443  INR 1.29 1.32 1.21 1.27    BMP: Recent Labs    03/27/18 0522 03/28/18 0454 03/29/18 0530 04/21/18 1213  NA 135 136 133* 134*  K 3.9 3.4* 3.7 3.3*  CL 96* 96* 95* 92*  CO2 27 29 25 24   GLUCOSE 56* 63* 67* 61*  BUN 20 13 22* 17  CALCIUM 8.2* 7.9* 7.5* 9.1  CREATININE 4.37* 3.66* 4.79* 4.51*  GFRNONAA 14* 17* 12* 14*  GFRAA 16* 20* 14* 16*    LIVER FUNCTION TESTS: Recent Labs    09/23/17 2350  12/12/17 1941  01/28/18 0608 02/18/18 0728  02/24/18 0743 02/26/18 0313 03/27/18 0522 03/29/18 0530  BILITOT 0.5  --  1.0  --  0.9 0.4  --   --   --   --   --   AST 18  --  35  --  22 34  --   --   --   --   --   ALT <5*  --  10  --  7 12  --   --   --   --   --  ALKPHOS 107  --  151*  --  215* 208*  --   --   --   --   --   PROT 7.0  --  6.2*  --  6.4* 6.0*  --   --   --   --   --   ALBUMIN 2.9*   < > 3.1*   < > 2.9* 2.6*   < > 2.3* 2.4* 2.2* 2.4*   < > = values in this interval not displayed.    TUMOR MARKERS: No results for input(s): AFPTM, CEA, CA199, CHROMGRNA in the last 8760 hours.  Assessment and Plan: Clotted (L)UE HERO AVG Discussed thrombolysis/thrombectomy of AV Graft/Fistula, possible angioplasty, possible stent, possible HD catheter placement if necessary. Risks, benefits, use of sedation thoroughly explained.   Thank you for this interesting consult.  I greatly enjoyed meeting Adam [name]. and look forward to participating in their care.  A copy of this report was sent to the requesting provider on this date.  Electronically Signed: Ascencion Dike, PA-C 05/19/2018, 7:58 AM   I  spent a total of 20 minutes in face to face in clinical consultation, greater than 50% of which was counseling/coordinating care for (L)UE AVG declot

## 2018-05-19 NOTE — Telephone Encounter (Signed)
sch appt spk to pt 05/21/2018 4pm f/u MD

## 2018-05-19 NOTE — Procedures (Signed)
Interventional Radiology Procedure:   Indications: Thrombosed HeRO graft  Procedure: Declot of HeRO graft  Findings: Successful declot.  Residual stenosis/thrombus in graft treated with 6 mm balloon.  Complications: None     EBL: Minimal  Plan: Graft is ready for use.  Removed sutures tomorrow.   Tobey Lippard R. Anselm Pancoast, MD  Pager: [phone]

## 2018-05-20 DIAGNOSIS — N2589 Other disorders resulting from impaired renal tubular function: Secondary | ICD-10-CM | POA: Diagnosis not present

## 2018-05-20 DIAGNOSIS — D509 Iron deficiency anemia, unspecified: Secondary | ICD-10-CM | POA: Diagnosis not present

## 2018-05-20 DIAGNOSIS — N2581 Secondary hyperparathyroidism of renal origin: Secondary | ICD-10-CM | POA: Diagnosis not present

## 2018-05-20 DIAGNOSIS — K7689 Other specified diseases of liver: Secondary | ICD-10-CM | POA: Diagnosis not present

## 2018-05-20 DIAGNOSIS — D511 Vitamin B12 deficiency anemia due to selective vitamin B12 malabsorption with proteinuria: Secondary | ICD-10-CM | POA: Diagnosis not present

## 2018-05-20 DIAGNOSIS — N186 End stage renal disease: Secondary | ICD-10-CM | POA: Diagnosis not present

## 2018-05-20 DIAGNOSIS — E785 Hyperlipidemia, unspecified: Secondary | ICD-10-CM | POA: Diagnosis not present

## 2018-05-20 DIAGNOSIS — D631 Anemia in chronic kidney disease: Secondary | ICD-10-CM | POA: Diagnosis not present

## 2018-05-20 DIAGNOSIS — E7849 Other hyperlipidemia: Secondary | ICD-10-CM | POA: Diagnosis not present

## 2018-05-21 ENCOUNTER — Other Ambulatory Visit: Payer: Self-pay | Admitting: *Deleted

## 2018-05-21 ENCOUNTER — Ambulatory Visit (INDEPENDENT_AMBULATORY_CARE_PROVIDER_SITE_OTHER): Payer: Medicare HMO | Admitting: Vascular Surgery

## 2018-05-21 ENCOUNTER — Other Ambulatory Visit: Payer: Self-pay

## 2018-05-21 ENCOUNTER — Encounter: Payer: Self-pay | Admitting: Vascular Surgery

## 2018-05-21 ENCOUNTER — Encounter: Payer: Self-pay | Admitting: *Deleted

## 2018-05-21 VITALS — BP 100/58 | HR 73 | Temp 98.0°F | Resp 20 | Ht 73.0 in | Wt 215.0 lb

## 2018-05-21 DIAGNOSIS — N2581 Secondary hyperparathyroidism of renal origin: Secondary | ICD-10-CM | POA: Diagnosis not present

## 2018-05-21 DIAGNOSIS — D509 Iron deficiency anemia, unspecified: Secondary | ICD-10-CM | POA: Diagnosis not present

## 2018-05-21 DIAGNOSIS — E7849 Other hyperlipidemia: Secondary | ICD-10-CM | POA: Diagnosis not present

## 2018-05-21 DIAGNOSIS — D631 Anemia in chronic kidney disease: Secondary | ICD-10-CM | POA: Diagnosis not present

## 2018-05-21 DIAGNOSIS — D511 Vitamin B12 deficiency anemia due to selective vitamin B12 malabsorption with proteinuria: Secondary | ICD-10-CM | POA: Diagnosis not present

## 2018-05-21 DIAGNOSIS — N2589 Other disorders resulting from impaired renal tubular function: Secondary | ICD-10-CM | POA: Diagnosis not present

## 2018-05-21 DIAGNOSIS — K7689 Other specified diseases of liver: Secondary | ICD-10-CM | POA: Diagnosis not present

## 2018-05-21 DIAGNOSIS — Z992 Dependence on renal dialysis: Secondary | ICD-10-CM | POA: Diagnosis not present

## 2018-05-21 DIAGNOSIS — N186 End stage renal disease: Secondary | ICD-10-CM | POA: Diagnosis not present

## 2018-05-21 DIAGNOSIS — E785 Hyperlipidemia, unspecified: Secondary | ICD-10-CM | POA: Diagnosis not present

## 2018-05-21 NOTE — Progress Notes (Signed)
Patient ID: Adam Alm., male   DOB: [birth year], 58 y.o.   MRN: [id number]  Reason for Consult: Follow-up   Referred by [name], MD  Subjective:     HPI:  Adam Dunson. is a 58 y.o. male history of end-stage renal disease currently on dialysis via left arm hero graft.  He is on dialysis 4 days a week at home.  Had a previous left thigh abscess which is now healed after tunneled dialysis catheter placement.  He is really on his left access which is an upper arm hero on the left.  Has had multiple clotting episodes.  He is now here for evaluation for revision.  Past Medical History:  Diagnosis Date  . Abscess 02/2018   LEFT THIGH   . AICD (automatic cardioverter/defibrillator) present    [employer] - s/p extraction 08/21/17 due to staph infection  . Anemia   . Aortic stenosis    moderate AS by 08/2016 echo  . Arthritis    "knees" (08/05/2016)  . Atrial fibrillation ([place])   . ESRD (end stage renal disease) on dialysis [facility])    ESRD due to HTN started HD 2003, had renal Tx 2009 for 18 mos. Was on NX stage hemodialysis as of 2018 but in late 2019 is back at [facility] incenter HD.   Marland Kitchen Great toe amputation status    status post left hallux amputation 08/01/14  . History of blood transfusion 2009   "S/P biopsy for prostate cancer check"  . Hypothyroidism (acquired)   . Mitral stenosis    moderate mitral stenosis  . Nonhealing surgical wound    left thigh  . Nonischemic cardiomyopathy ([place])   . Pneumonia 2014; 08/05/2016  . Pulmonary HTN ([place])   . Renal insufficiency   . Thyroid cancer ([place]) 2011   Family History  Problem Relation Age of Onset  . Heart failure Mother   . Hypertension Mother   . CAD Mother 1  . Emphysema Mother   . Hypertension Father   . Kidney failure Father    Past Surgical History:  Procedure Laterality Date  . A/V SHUNTOGRAM N/A 02/25/2018   Procedure: A/V SHUNTOGRAM;  Surgeon: Marty Heck, MD;  Location: [place] [facility];  Service: Cardiovascular;  Laterality: N/A;  lt arm fistula  . ARTERIAL LINE INSERTION Right 08/24/2017   Procedure: ARTERIAL LINE INSERTION INTO RIGHT FEMORAL ARTERY;  Surgeon: Conrad Duchesne, MD;  Location: [place];  Service: Vascular;  Laterality: Right;  . BASCILIC VEIN TRANSPOSITION Right 10/13/2017   Procedure: FIRST STAGE BRACHIAL VEIN TRANSPOSITION RIGHT ARM;  Surgeon: Conrad Harrell, MD;  Location: [place];  Service: Vascular;  Laterality: Right;  . BIOPSY  09/25/2017   Procedure: BIOPSY;  Surgeon: Ronnette Juniper, MD;  Location: [facility] ENDOSCOPY;  Service: Gastroenterology;;  . CARDIAC CATHETERIZATION Right 2017  . CARDIAC DEFIBRILLATOR PLACEMENT  2009  . DIALYSIS FISTULA CREATION Left 2003   "upper arm"  . DIALYSIS/PERMA CATHETER REMOVAL Left 09/01/2017   Procedure: DIALYSIS/PERMA CATHETER REMOVAL;  Surgeon: Conrad Sunnyvale, MD;  Location: [place];  Service: Vascular;  Laterality: Left;  . ESOPHAGOGASTRODUODENOSCOPY (EGD) WITH PROPOFOL N/A 09/25/2017   Procedure: ESOPHAGOGASTRODUODENOSCOPY (EGD) WITH PROPOFOL;  Surgeon: Ronnette Juniper, MD;  Location: [place];  Service: Gastroenterology;  Laterality: N/A;  . HERNIA REPAIR    . I&D EXTREMITY Right 11/06/2017   Procedure: IRRIGATION AND DEBRIDEMENT KNEE;  Surgeon: Rod Can, MD;  Location: [place];  Service: Orthopedics;  Laterality:  Right;  . I&D EXTREMITY Left 01/03/2018   Procedure: IRRIGATION AND DEBRIDEMENT OF THIGH, wound vac application  wound size 10cm x 2.5cm x 2.5cm;  Surgeon: Waynetta Sandy, MD;  Location: [place];  Service: Vascular;  Laterality: Left;  . ICD LEAD REMOVAL Right 08/20/2017   Procedure: ICD EXTRACTION WITH C-ARM;  Surgeon: Evans Lance, MD;  Location: [facility] OR;  Service: Cardiovascular;  Laterality: Right;  DR VAN TRIGT TO BACK-UP  . icd removed    . INSERTION OF DIALYSIS CATHETER N/A 11/11/2016   Procedure: INSERTION OF TUNNELED DIALYSIS CATHETER;  Surgeon: Angelia Mould, MD;  Location: [place];  Service:  Vascular;  Laterality: N/A;  . INSERTION OF DIALYSIS CATHETER Left 08/24/2017   Procedure: INSERTION OF DIALYSIS CATHETER;  Surgeon: Conrad D'Iberville, MD;  Location: [place];  Service: Vascular;  Laterality: Left;  . INSERTION OF DIALYSIS CATHETER Left 09/01/2017   Procedure: INSERTION OF DIALYSIS CATHETER;  Surgeon: Conrad Tallmadge, MD;  Location: [place];  Service: Vascular;  Laterality: Left;  . IR RADIOLOGY PERIPHERAL GUIDED IV START  05/19/2018  . IR THROMBECTOMY AV FISTULA W/THROMBOLYSIS/PTA INC/SHUNT/IMG LEFT Left 05/19/2018  . IR US GUIDE VASC ACCESS LEFT  05/19/2018  . IR US GUIDE VASC ACCESS RIGHT  05/19/2018  . KIDNEY TRANSPLANT  2009  . KNEE SURGERY Bilateral 2000s   "drained fluid"  . LAPAROSCOPIC GASTRIC BANDING  2006  . LIGATION OF ARTERIOVENOUS  FISTULA Right 11/10/2017   Procedure: LIGATION OF BRACHIAL VEIN TRANSPOSITION RIGHT ARM;  Surgeon: Angelia Mould, MD;  Location: [place];  Service: Vascular;  Laterality: Right;  . MASS EXCISION N/A 01/12/2018   Procedure: EXCISION OF MULTIPLE SUBCUTANEOUS MASSES;  Surgeon: Ralene Ok, MD;  Location: [place];  Service: General;  Laterality: N/A;  . PROSTATE BIOPSY  2009  . REVISON OF ARTERIOVENOUS FISTULA Left 5/88/5027   Procedure: PLICATION OF ARTERIOVENOUS FISTULA ANEURYSM;  Surgeon: Elam Dutch, MD;  Location: [place];  Service: Vascular;  Laterality: Left;  . REVISON OF ARTERIOVENOUS FISTULA Left 11/10/2016   Procedure: REVISON OF ARTERIOVENOUS FISTULA - LEFT UPPER ARM;  Surgeon: Elam Dutch, MD;  Location: [place];  Service: Vascular;  Laterality: Left;  . REVISON OF ARTERIOVENOUS FISTULA Left 08/24/2017   Procedure: REVISON OF ARTERIOVENOUS FISTULA LEFT;  Surgeon: Conrad [place], MD;  Location: [place];  Service: Vascular;  Laterality: Left;  . TEE WITHOUT CARDIOVERSION N/A 08/20/2017   Procedure: TRANSESOPHAGEAL ECHOCARDIOGRAM (TEE);  Surgeon: Evans Lance, MD;  Location: [place];  Service: Cardiovascular;  Laterality: N/A;  .  THROMBECTOMY W/ EMBOLECTOMY Left 12/25/2014   Procedure: THROMBECTOMY ARTERIOVENOUS FISTULA;  Surgeon: Elam Dutch, MD;  Location: [place];  Service: Vascular;  Laterality: Left;  . THYROIDECTOMY  2011  . TOE AMPUTATION Bilateral     right 1st and  2nd digits; left 1st and 3rd digits"  . UMBILICAL HERNIA REPAIR  X 2  . UPPER EXTREMITY VENOGRAPHY Right 11/05/2017   Procedure: UPPER EXTREMITY VENOGRAPHY;  Surgeon: Conrad [place], MD;  Location: [place] [facility];  Service: Cardiovascular;  Laterality: Right;  Marland Kitchen VASCULAR ACCESS DEVICE INSERTION Left 11/10/2017   Procedure: INSERTION OF SUPER HERO VASCULAR ACCESS DEVICE LEFT ARM;  Surgeon: Angelia Mould, MD;  Location: [place];  Service: Vascular;  Laterality: Left;  Marland Kitchen VEIN HARVEST Left 08/24/2017   Procedure: VEIN HARVEST;  Surgeon: Conrad [place], MD;  Location: [place];  Service: Vascular;  Laterality: Left;  .  WOUND DEBRIDEMENT Left 12/22/2017   Procedure: EXCISION OF LEFT THIGH WOUND;  Surgeon: Elam Dutch, MD;  Location: [place];  Service: Vascular;  Laterality: Left;  . WOUND DEBRIDEMENT Left 01/28/2018   Procedure: DEBRIDEMENT WOUND LEFT THIGH;  Surgeon: Marty Heck, MD;  Location: [place];  Service: Vascular;  Laterality: Left;  . WOUND DEBRIDEMENT Right 02/23/2018   Procedure: DEBRIDEMENT AND IRRIGATION OF RIGHT FLANK WOUND;  Surgeon: Ralene Ok, MD;  Location: [place];  Service: General;  Laterality: Right;    Short Social History:  Social History   Tobacco Use  . Smoking status: Never Smoker  . Smokeless tobacco: Never Used  Substance Use Topics  . Alcohol use: Yes    Alcohol/week: 0.0 standard drinks    Comment: social    Allergies  Allergen Reactions  . Enalapril Swelling and Other (See Comments)    MOUTH SWELLING EDEMA  . Iodinated Diagnostic Agents Nausea And Vomiting    Current Outpatient Medications  Medication Sig Dispense Refill  . acetaminophen (TYLENOL) 500 MG tablet Take 1,000 mg by mouth  daily as needed for moderate pain or headache.    . albuterol (PROVENTIL) (2.5 MG/3ML) 0.083% nebulizer solution Take 3 mLs (2.5 mg total) by nebulization every 6 (six) hours as needed for wheezing or shortness of breath. 360 mL 5  . ambrisentan (LETAIRIS) 10 MG tablet Take 1 tablet (10 mg total) by mouth daily. (Patient taking differently: Take 10 mg by mouth every evening. ) 30 tablet 11  . amiodarone (PACERONE) 200 MG tablet Take 200 mg by mouth every evening.     Marland Kitchen atorvastatin (LIPITOR) 40 MG tablet Take 1 tablet (40 mg total) by mouth every evening. 30 tablet 2  . cinacalcet (SENSIPAR) 90 MG tablet Take 90 mg by mouth every evening.    Marland Kitchen levothyroxine (SYNTHROID, LEVOTHROID) 300 MCG tablet Take 1 tablet (300 mcg total) by mouth daily before breakfast. 90 tablet 3  . midodrine (PROAMATINE) 10 MG tablet TAKE 1 TABLET (10 MG TOTAL) BY MOUTH 3 (THREE) TIMES DAILY. 90 tablet 5  . multivitamin (RENA-VIT) TABS tablet TAKE 1 TABLET BY MOUTH EVERYDAY AT BEDTIME (Patient taking differently: Take 1 tablet by mouth at bedtime. ) 30 tablet 0  . nystatin (MYCOSTATIN/NYSTOP) powder Apply topically 4 (four) times daily. (Patient taking differently: Apply 1 g topically daily as needed (rash). ) 60 g 0  . pantoprazole (PROTONIX) 40 MG tablet Take 1 tablet (40 mg total) by mouth 2 (two) times daily. 30 tablet 1  . Pseudoeph-Doxylamine-DM-APAP (NYQUIL PO) Take 2 capsules by mouth daily as needed (sleep/cold symptoms).    . tadalafil (ADCIRCA/CIALIS) 20 MG tablet Take 20 mg by mouth every evening.     . traMADol (ULTRAM) 50 MG tablet Take 1 tablet (50 mg total) by mouth every 6 (six) hours as needed. 28 tablet 0   No current facility-administered medications for this visit.     Review of Systems  Constitutional:  Constitutional negative. HENT: HENT negative.  Eyes: Eyes negative.  Respiratory: Respiratory negative.  Cardiovascular: Cardiovascular negative.  GI: Gastrointestinal negative.  Musculoskeletal:  Musculoskeletal negative.  Skin: Skin negative.  Neurological: Neurological negative. Hematologic: Hematologic/lymphatic negative.  Psychiatric: Psychiatric negative.        Objective:  Objective   There were no vitals filed for this visit. There is no height or weight on file to calculate BMI.  Physical Exam Eyes:     Pupils: Pupils are equal, round, and reactive to light.  Neck:  Musculoskeletal: Normal range of motion.  Cardiovascular:     Rate and Rhythm: Normal rate.  Pulmonary:     Effort: Pulmonary effort is normal.  Abdominal:     General: Abdomen is flat.  Musculoskeletal: Normal range of motion.        General: No swelling.     Comments: Left upper arm has weak pulsatility in the hero  Skin:    Capillary Refill: Capillary refill takes less than 2 seconds.     Comments: Nearly healed left groin wound  Neurological:     General: No focal deficit present.     Mental Status: He is alert and oriented to person, place, and time.  Psychiatric:        Mood and Affect: Mood normal.        Behavior: Behavior normal.        Thought Content: Thought content normal.        Judgment: Judgment normal.          Assessment/Plan:     58 year old male here for evaluation of persistent thrombosis left arm AV graft.  We will plan for quick stick AcuSeal graft revision early next week.  I discussed with him the risk and benefits.  He will likely need to hold dialysis on Tuesday while we get him dialyzed and he can stick the graft the following day.  He demonstrates very good understanding.  He may need limited thrombectomy possible hero revision the day as well.     Waynetta Sandy MD [facility]

## 2018-05-24 ENCOUNTER — Other Ambulatory Visit: Payer: Self-pay

## 2018-05-24 ENCOUNTER — Encounter (HOSPITAL_COMMUNITY): Payer: Self-pay

## 2018-05-24 DIAGNOSIS — E7849 Other hyperlipidemia: Secondary | ICD-10-CM | POA: Diagnosis not present

## 2018-05-24 DIAGNOSIS — N2581 Secondary hyperparathyroidism of renal origin: Secondary | ICD-10-CM | POA: Diagnosis not present

## 2018-05-24 DIAGNOSIS — D509 Iron deficiency anemia, unspecified: Secondary | ICD-10-CM | POA: Diagnosis not present

## 2018-05-24 DIAGNOSIS — N186 End stage renal disease: Secondary | ICD-10-CM | POA: Diagnosis not present

## 2018-05-24 DIAGNOSIS — D511 Vitamin B12 deficiency anemia due to selective vitamin B12 malabsorption with proteinuria: Secondary | ICD-10-CM | POA: Diagnosis not present

## 2018-05-24 DIAGNOSIS — E785 Hyperlipidemia, unspecified: Secondary | ICD-10-CM | POA: Diagnosis not present

## 2018-05-24 DIAGNOSIS — D631 Anemia in chronic kidney disease: Secondary | ICD-10-CM | POA: Diagnosis not present

## 2018-05-24 DIAGNOSIS — N2589 Other disorders resulting from impaired renal tubular function: Secondary | ICD-10-CM | POA: Diagnosis not present

## 2018-05-24 DIAGNOSIS — K7689 Other specified diseases of liver: Secondary | ICD-10-CM | POA: Diagnosis not present

## 2018-05-24 NOTE — Progress Notes (Signed)
Anesthesia Chart Review: SAME DAY WORK-UP   Case:  [id number] Date/Time:  05/25/18 0853   Procedure:  REVISION OF HERO GRAFT  WITH ACCUSEAL LEFT UPPER ARM (Left )   Anesthesia type:  Monitor Anesthesia Care   Pre-op diagnosis:  END STAGE RENAL DISEASE COMPLICATION WITH HERO GRAFT LEFT ARM   Location:  [facility] OR ROOM 12 / [place]   Surgeon:  Waynetta Sandy, MD      DISCUSSION: Patient is a 58 year old male scheduled for the above procedure.    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; was doing home dialysis, now hemodialysis; not a candidate for re-kidney transplant at [facility] as of 02/25/16), pulmonary hypertension (on tadaladfil, ambrisentan), mild-moderate mitral stenosis 08/2017, moderate AS 02/2018, afib/PAF (anticoagulation discontinued 04/13/18), non-ischemic cardiomyopathy (diagnosed in [place]), Medtronic Evera XT dual chamber ICD '09 (generator change 06/16/14; s/p ICD extraction 08/21/17 due to bacteremia, Life Vest prescribed; discontinued 10/21/17 per EP), thyroid cancer (s/p thyroidectomy '11; acquired hypothyroidism), anemia, left great toe amputation 08/01/14, sinus surgery '11 (near complete nasal septal perforation by ENT Dr. Wilburn Cornelia notes 09/15/16). He is on midodrine for hypotension.   He has had multiple admissions and procedures done over the past six months that include: - 05/19/18: IR thombectomy AVF HERO graft. Residual stenosis/thrombus treated with 6 mm balloon.  - 02/18/18-02/26/18: Admission for arterial bleed from wound breakdown, SIRS vs sepsis, right flank abscess. S/p debridement right flank wound 02/23/18. Widely patent left HERO graft on 02/25/18 shuntogram. - 01/28/18: Excisional debridement of skin and soft tissue to left thigh wound. - 01/12/18: Excision of multiple subcutanous masses x3 01/12/18 - 01/02/18-01/05/18: Admission for left thigh incisional infection. S/p debridement of left thigh skin and soft tissue  with placement of a negative pressure dressing on 01/03/18. 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             -           11/10/17: Removal of left IJ tunneled dialysis catheter, placement of HeRO               graft with AcuSeal graft, ligation of first stage right brachiobrachial AVF             -           11/06/17: Open arthrotomy and drainage right knee with synovectomy (for              septic arthritis)             -           11/05/17: Right arm fistulogram, left central venogram (findings: occluded               bilateral central venous systems, right central venous system drains via                  hypertrophied neck veins; occluded left subclavian vein and axillary vein              stents, left IF vein tunneled catheter in place) - 10/13/17: Right first stage brachial vein transposition (brachiobrachial AVF)   Labs and anesthesiologist evaluation on the day of surgery.   VS: Ht 6\' 1"  (1.854 m)   Wt 97.5 kg   BMI 28.37 kg/m     PROVIDERS: - PCP is  with [facility]. Listed as [name], MD.  - Cardiologist is Alyson Locket, MD. Last visit on 04/13/18. Dr. Sallyanne Kuster discontinued anticoagulation because no further afib detected and felt to be at increased risk on anticoagulation for bleeding/anemia. He also commented that, "In my opinion, the assessment of LVEF on the study from October was an underestimation and his real ejection fraction is 40-45% On physician exam he has dominant findings of right heart failure, nothing to suggest left heart failure." He does not meet criteria for defibrillator implantation and this would be at hight risk proposition anyway..." (He had also seen Minus Breeding, MD and Quay Burow, MD after moving from [place] in 2016). According to  cardiology notes from [facility] (scanned under Media tab), myocardial biopsy was negative for amyloid '12 and had cardiac radiofrequency ablation '11 and gastric banding '06.  - EP cardiologist is Cristopher Peru, MD. Last visit 10/21/17. He discontinuee the Life Vest given patient's EF 08/2017 was normalized. Next visit scheduled for 08/17/18. There are not plans to re-insert an ICD at this time given improved EF and high risk for re-infection.  - Pulmonologist is Dr. Simonne Maffucci, MD. Last visit 04/1718. He had been stable since last visit. See note in Epic for details. No medication changes were made. He also saw Alisia Ferrari at the [facility] on 07/17/15 ([place]). - Nephrologist is Pearson Grippe, MD. He was declined by [facility] team due to history of hypotension and pulmonary hypertension.  - Endocrinologist is Renato Shin, MD.   LABS: He is for labs on arrival the day of surgery.   IMAGES: CXR 03/25/18: IMPRESSION: Mild vascular congestion consistent with mild fluid overload related to end-stage renal disease. No new focal abnormality is seen.   EKG: 03/26/18: SR with sinus arrhythmia. Occasional PVC. Low voltage QRS. Left posterior fascicular block. Cannot rule out anteroseptal infarct (age undetermined). Prolonged QT.    CV: Echo 02/19/18: Study Conclusions - Left ventricle: The cavity size was normal. There was moderate   concentric hypertrophy. Systolic function was moderately to   severely reduced. The estimated ejection fraction was in the   range of 30% to 35%. Diffuse hypokinesis. Doppler parameters are   consistent with abnormal left ventricular relaxation (grade 1   diastolic dysfunction). The Ee&' ratio is >15, suggesting elevated   LV filling pressure. - Ventricular septum: Septal motion showed abnormal function and   dyssynergy. The contour showed diastolic flattening and systolic   flattening. - Aortic valve: Calcified  with restricted leaflet motion. There is   moderate stenosis. Mean gradient (S): 26 mm Hg. Peak gradient   (S): 41 mm Hg. Valve area (VTI): 1.16 cm^2. Valve area (Vmax):   1.2 cm^2. Valve area (Vmean): 1.05 cm^2. - Mitral valve: Valve area by continuity equation (using LVOT   flow): 1.83 cm^2. - Left atrium: Moderately dilated. - Right ventricle: The cavity size was severely dilated. AICD noted   in right ventricle. - Right atrium: Severely dilated. AICD noted in right atrium. - Tricuspid valve: There was severe regurgitation. - Pulmonary arteries: PA peak pressure: 61 mm Hg (S). - Inferior vena cava: The vessel was dilated. The respirophasic   diameter changes were blunted (< 50%), consistent with elevated   central venous pressure. Impressions: - Compared to a prior study in 08/2017, the LVEF is lower at 30-35%   with severe global hypokinesis, grade 1 DD and elevated LV   filling pressure. There is  severe RV enlargement with moderate RV   Systolic dysfunction, severe TR and severe RAE, RVSP is 61 mmHg,   the IVC is dilated. The aortic valve is calcified with moderate   stenosis - mean gradient of 26 mmHg and AVA around 1.2 cm2. (08/18/17 echo also showed mild to moderate mitral stenosis.)  RHC 08/15/15 ([facility]; Care Everywhere): Component Name Value Ref Range  Cardiac Index (l/min/m2) 3.7 L/min/m2  Right Atrium Mean Pressure (mmHg) 5 mmHg   Right Ventricle Systolic Pressure (mmHg) 46 mmHg   Pulmonary Artery Mean Pressure (mmHg) 31 mmHg   Pulmonary Wedge Pressure (mmHg) 11 mmHg   Pulmonary Vascular Resistance (Wood units) 2.2 Wood units    Carotid U/S 05/10/15: Impression: Heterogeneous calcified plaque shadowing, bilaterally. 1-39% right ICA stenosis, higher velocities could be of secured by shadowing. 40-59% left ICA stenosis, higher velocities could be at her by shadowing. 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 Normal subclavian arteries bilaterally. Patent vertebral arteries  with antegrade flow.   Past Medical History:  Diagnosis Date  . Abscess 02/2018   LEFT THIGH   . AICD (automatic cardioverter/defibrillator) present    Medtronic - s/p extraction 08/21/17 due to staph infection  . Anemia   . Aortic stenosis    moderate AS by 08/2016 echo  . Arthritis    "knees" (08/05/2016)  . Atrial fibrillation ([place])   . ESRD (end stage renal disease) on dialysis [facility])    ESRD due to HTN started HD 2003, had renal Tx 2009 for 18 mos. Was on NX stage hemodialysis as of 2018 but in late 2019 is back at [facility] incenter HD.   Marland Kitchen Great toe amputation status    status post left hallux amputation 08/01/14  . History of blood transfusion 2009   "S/P biopsy for prostate cancer check"  . Hypothyroidism (acquired)   . Mitral stenosis    moderate mitral stenosis  . Nonhealing surgical wound    left thigh  . Nonischemic cardiomyopathy ([place])   . Pneumonia 2014; 08/05/2016  . Pulmonary HTN ([place])   . Renal insufficiency   . Thyroid cancer ([place]) 2011    Past Surgical History:  Procedure Laterality Date  . A/V SHUNTOGRAM N/A 02/25/2018   Procedure: A/V SHUNTOGRAM;  Surgeon: Marty Heck, MD;  Location: [place] CV LAB;  Service: Cardiovascular;  Laterality: N/A;  lt arm fistula  . ARTERIAL LINE INSERTION Right 08/24/2017   Procedure: ARTERIAL LINE INSERTION INTO RIGHT FEMORAL ARTERY;  Surgeon: Conrad Almont, MD;  Location: [place];  Service: Vascular;  Laterality: Right;  . BASCILIC VEIN TRANSPOSITION Right 10/13/2017   Procedure: FIRST STAGE BRACHIAL VEIN TRANSPOSITION RIGHT ARM;  Surgeon: Conrad [place]ne[place], MD;  Location: [place];  Service: Vascular;  Laterality: Right;  . BIOPSY  09/25/2017   Procedure: BIOPSY;  Surgeon: Ronnette Juniper, MD;  Location: [facility] ENDOSCOPY;  Service: Gastroenterology;;  . CARDIAC CATHETERIZATION Right 2017  . CARDIAC DEFIBRILLATOR PLACEMENT  2009  . DIALYSIS FISTULA CREATION Left 2003   "upper arm"  . DIALYSIS/PERMA CATHETER REMOVAL Left 09/01/2017    Procedure: DIALYSIS/PERMA CATHETER REMOVAL;  Surgeon: Conrad Tomahawk, MD;  Location: [place];  Service: Vascular;  Laterality: Left;  . ESOPHAGOGASTRODUODENOSCOPY (EGD) WITH PROPOFOL N/A 09/25/2017   Procedure: ESOPHAGOGASTRODUODENOSCOPY (EGD) WITH PROPOFOL;  Surgeon: Ronnette Juniper, MD;  Location: [place];  Service: Gastroenterology;  Laterality: N/A;  . HERNIA REPAIR    . I&D EXTREMITY Right 11/06/2017   Procedure: IRRIGATION AND DEBRIDEMENT KNEE;  Surgeon: Rod Can,  MD;  Location: [place];  Service: Orthopedics;  Laterality: Right;  . I&D EXTREMITY Left 01/03/2018   Procedure: IRRIGATION AND DEBRIDEMENT OF THIGH, wound vac application  wound size 10cm x 2.5cm x 2.5cm;  Surgeon: Waynetta Sandy, MD;  Location: [place];  Service: Vascular;  Laterality: Left;  . ICD LEAD REMOVAL Right 08/20/2017   Procedure: ICD EXTRACTION WITH C-ARM;  Surgeon: Evans Lance, MD;  Location: [facility] OR;  Service: Cardiovascular;  Laterality: Right;  DR VAN TRIGT TO BACK-UP  . icd removed    . INSERTION OF DIALYSIS CATHETER N/A 11/11/2016   Procedure: INSERTION OF TUNNELED DIALYSIS CATHETER;  Surgeon: Angelia Mould, MD;  Location: [place];  Service: Vascular;  Laterality: N/A;  . INSERTION OF DIALYSIS CATHETER Left 08/24/2017   Procedure: INSERTION OF DIALYSIS CATHETER;  Surgeon: Conrad Bertram, MD;  Location: [place];  Service: Vascular;  Laterality: Left;  . INSERTION OF DIALYSIS CATHETER Left 09/01/2017   Procedure: INSERTION OF DIALYSIS CATHETER;  Surgeon: Conrad Rowena, MD;  Location: [place];  Service: Vascular;  Laterality: Left;  . IR RADIOLOGY PERIPHERAL GUIDED IV START  05/19/2018  . IR THROMBECTOMY AV FISTULA W/THROMBOLYSIS/PTA INC/SHUNT/IMG LEFT Left 05/19/2018  . IR US GUIDE VASC ACCESS LEFT  05/19/2018  . IR US GUIDE VASC ACCESS RIGHT  05/19/2018  . KIDNEY TRANSPLANT  2009  . KNEE SURGERY Bilateral 2000s   "drained fluid"  . LAPAROSCOPIC GASTRIC BANDING  2006  . LIGATION OF ARTERIOVENOUS  FISTULA  Right 11/10/2017   Procedure: LIGATION OF BRACHIAL VEIN TRANSPOSITION RIGHT ARM;  Surgeon: Angelia Mould, MD;  Location: [place];  Service: Vascular;  Laterality: Right;  . MASS EXCISION N/A 01/12/2018   Procedure: EXCISION OF MULTIPLE SUBCUTANEOUS MASSES;  Surgeon: Ralene Ok, MD;  Location: [place];  Service: General;  Laterality: N/A;  . PROSTATE BIOPSY  2009  . REVISON OF ARTERIOVENOUS FISTULA Left 12/19/9831   Procedure: PLICATION OF ARTERIOVENOUS FISTULA ANEURYSM;  Surgeon: Elam Dutch, MD;  Location: [place];  Service: Vascular;  Laterality: Left;  . REVISON OF ARTERIOVENOUS FISTULA Left 11/10/2016   Procedure: REVISON OF ARTERIOVENOUS FISTULA - LEFT UPPER ARM;  Surgeon: Elam Dutch, MD;  Location: [place];  Service: Vascular;  Laterality: Left;  . REVISON OF ARTERIOVENOUS FISTULA Left 08/24/2017   Procedure: REVISON OF ARTERIOVENOUS FISTULA LEFT;  Surgeon: Conrad [place], MD;  Location: [place];  Service: Vascular;  Laterality: Left;  . TEE WITHOUT CARDIOVERSION N/A 08/20/2017   Procedure: TRANSESOPHAGEAL ECHOCARDIOGRAM (TEE);  Surgeon: Evans Lance, MD;  Location: [place];  Service: Cardiovascular;  Laterality: N/A;  . THROMBECTOMY W/ EMBOLECTOMY Left 12/25/2014   Procedure: THROMBECTOMY ARTERIOVENOUS FISTULA;  Surgeon: Elam Dutch, MD;  Location: [place];  Service: Vascular;  Laterality: Left;  . THYROIDECTOMY  2011  . TOE AMPUTATION Bilateral     right 1st and  2nd digits; left 1st and 3rd digits"  . UMBILICAL HERNIA REPAIR  X 2  . UPPER EXTREMITY VENOGRAPHY Right 11/05/2017   Procedure: UPPER EXTREMITY VENOGRAPHY;  Surgeon: Conrad [place], MD;  Location: [place] CV [facility];  Service: Cardiovascular;  Laterality: Right;  Marland Kitchen VASCULAR ACCESS DEVICE INSERTION Left 11/10/2017   Procedure: INSERTION OF SUPER HERO VASCULAR ACCESS DEVICE LEFT ARM;  Surgeon: Angelia Mould, MD;  Location: [place];  Service: Vascular;  Laterality: Left;  Marland Kitchen VEIN HARVEST Left 08/24/2017   Procedure:  VEIN HARVEST;  Surgeon: Conrad [place], MD;  [facility];  Service: Vascular;  Laterality: Left;  . WOUND DEBRIDEMENT Left 12/22/2017   Procedure: EXCISION OF LEFT THIGH WOUND;  Surgeon: Elam Dutch, MD;  Location: [place];  Service: Vascular;  Laterality: Left;  . WOUND DEBRIDEMENT Left 01/28/2018   Procedure: DEBRIDEMENT WOUND LEFT THIGH;  Surgeon: Marty Heck, MD;  Location: [place];  Service: Vascular;  Laterality: Left;  . WOUND DEBRIDEMENT Right 02/23/2018   Procedure: DEBRIDEMENT AND IRRIGATION OF RIGHT FLANK WOUND;  Surgeon: Ralene Ok, MD;  Location: [place];  Service: General;  Laterality: Right;    MEDICATIONS: No current facility-administered medications for this encounter.    Marland Kitchen albuterol (PROVENTIL) (2.5 MG/3ML) 0.083% nebulizer solution  . ambrisentan (LETAIRIS) 10 MG tablet  . amiodarone (PACERONE) 200 MG tablet  . atorvastatin (LIPITOR) 40 MG tablet  . cinacalcet (SENSIPAR) 90 MG tablet  . levothyroxine (SYNTHROID, LEVOTHROID) 300 MCG tablet  . midodrine (PROAMATINE) 10 MG tablet  . nystatin (MYCOSTATIN/NYSTOP) powder  . pantoprazole (PROTONIX) 40 MG tablet  . tadalafil (ADCIRCA/CIALIS) 20 MG tablet  . multivitamin (RENA-VIT) TABS tablet  . traMADol (ULTRAM) 50 MG tablet    Myra Gianotti, PA-C Surgical Short Stay/Anesthesiology [facility] Phone [phone] [facility] Phone [phone] 05/24/2018 9:29 PM

## 2018-05-24 NOTE — Progress Notes (Signed)
PCP - Dr. Lockie Pares  Cardiologist - Dr. Percival Spanish  Chest x-ray - 03/25/18 (E)  EKG - 03/25/18 (E)  Stress Test - Denies  ECHO - 02/19/18 (E)  Cardiac Cath - 2017 (E)  AICD- na PM- na-removed 08/2017 (E) LOOP- na  Sleep Study - Denies CPAP - None  LABS- 05/25/2018: I-Stat- 4  ASA-Denies   Anesthesia- Yes- Cardiac history  Pt denies having any chest pain, sob, or fever during the pre-op phone call. All instructions explained to the pt, with a verbal understanding of the material including: As of today, stop taking all Other Aspirin Products, Vitamins, Fish oils, and Herbal medications. Also stop all NSAIDS i.e. Advil, Ibuprofen, Motrin, Aleve, Anaprox, Naproxen, BC, Goody Powders, and all Supplements. The opportunity to ask questions was provided.

## 2018-05-24 NOTE — Anesthesia Preprocedure Evaluation (Addendum)
Anesthesia Evaluation  Patient identified by MRN, date of birth, ID band Patient awake    Reviewed: Allergy & Precautions, NPO status , Patient's Chart, lab work & pertinent test results  Airway Mallampati: I  TM Distance: >3 FB Neck ROM: Full    Dental  (+) Edentulous Upper   Pulmonary shortness of breath, pneumonia, resolved,    Pulmonary exam normal breath sounds clear to auscultation       Cardiovascular +CHF  Normal cardiovascular exam+ dysrhythmias Atrial Fibrillation + Cardiac Defibrillator + Valvular Problems/Murmurs AS  Rhythm:Regular Rate:Normal  Moderate AS and MS Non ischemic CM Pulmonary HTN   Neuro/Psych Diabetic peripheral neuropathy negative psych ROS   GI/Hepatic negative GI ROS,   Endo/Other  diabetes, Well Controlled, Type 2Hypothyroidism Hyperlipidemia  Renal/GU Dialysis and ESRFRenal disease   ED    Musculoskeletal  (+) Arthritis , Osteoarthritis,    Abdominal   Peds  Hematology  (+) anemia ,   Anesthesia Other Findings   Reproductive/Obstetrics                             Anesthesia Physical Anesthesia Plan  ASA: IV  Anesthesia Plan:    Post-op Pain Management:    Induction:   PONV Risk Score and Plan: 1  Airway Management Planned: Natural Airway, Simple Face Mask and Nasal Cannula  Additional Equipment:   Intra-op Plan:   Post-operative Plan:   Informed Consent: I have reviewed the patients History and Physical, chart, labs and discussed the procedure including the risks, benefits and alternatives for the proposed anesthesia with the patient or authorized representative who has indicated his/her understanding and acceptance.   Dental advisory given  Plan Discussed with: CRNA and Surgeon  Anesthesia Plan Comments: (Same day work-up. See PAT note written 05/24/2018 by Myra Gianotti, PA-C. )       Anesthesia Quick Evaluation

## 2018-05-25 ENCOUNTER — Ambulatory Visit (HOSPITAL_COMMUNITY): Payer: Medicare HMO | Admitting: Vascular Surgery

## 2018-05-25 ENCOUNTER — Encounter (HOSPITAL_COMMUNITY)
Admission: RE | Disposition: A | Discharge status: Home / Self Care | Payer: Self-pay | Source: Home / Self Care | Attending: Vascular Surgery

## 2018-05-25 ENCOUNTER — Encounter (HOSPITAL_COMMUNITY): Payer: Self-pay | Admitting: *Deleted

## 2018-05-25 ENCOUNTER — Ambulatory Visit (HOSPITAL_COMMUNITY)
Admission: RE | Admit: 2018-05-25 | Discharge: 2018-05-25 | Disposition: A | Discharge status: Home / Self Care | Payer: Medicare HMO | Attending: Vascular Surgery | Admitting: Vascular Surgery

## 2018-05-25 DIAGNOSIS — Z992 Dependence on renal dialysis: Secondary | ICD-10-CM | POA: Diagnosis not present

## 2018-05-25 DIAGNOSIS — N186 End stage renal disease: Secondary | ICD-10-CM | POA: Diagnosis not present

## 2018-05-25 DIAGNOSIS — I5023 Acute on chronic systolic (congestive) heart failure: Secondary | ICD-10-CM | POA: Diagnosis not present

## 2018-05-25 DIAGNOSIS — Z79890 Hormone replacement therapy (postmenopausal): Secondary | ICD-10-CM | POA: Insufficient documentation

## 2018-05-25 DIAGNOSIS — I4891 Unspecified atrial fibrillation: Secondary | ICD-10-CM | POA: Diagnosis not present

## 2018-05-25 DIAGNOSIS — D631 Anemia in chronic kidney disease: Secondary | ICD-10-CM | POA: Diagnosis not present

## 2018-05-25 DIAGNOSIS — Z89422 Acquired absence of other left toe(s): Secondary | ICD-10-CM | POA: Insufficient documentation

## 2018-05-25 DIAGNOSIS — N2581 Secondary hyperparathyroidism of renal origin: Secondary | ICD-10-CM | POA: Diagnosis not present

## 2018-05-25 DIAGNOSIS — E1142 Type 2 diabetes mellitus with diabetic polyneuropathy: Secondary | ICD-10-CM | POA: Diagnosis not present

## 2018-05-25 DIAGNOSIS — T82868A Thrombosis of vascular prosthetic devices, implants and grafts, initial encounter: Secondary | ICD-10-CM | POA: Insufficient documentation

## 2018-05-25 DIAGNOSIS — E1122 Type 2 diabetes mellitus with diabetic chronic kidney disease: Secondary | ICD-10-CM | POA: Insufficient documentation

## 2018-05-25 DIAGNOSIS — E785 Hyperlipidemia, unspecified: Secondary | ICD-10-CM | POA: Insufficient documentation

## 2018-05-25 DIAGNOSIS — K7689 Other specified diseases of liver: Secondary | ICD-10-CM | POA: Diagnosis not present

## 2018-05-25 DIAGNOSIS — E890 Postprocedural hypothyroidism: Secondary | ICD-10-CM | POA: Diagnosis not present

## 2018-05-25 DIAGNOSIS — E7849 Other hyperlipidemia: Secondary | ICD-10-CM | POA: Diagnosis not present

## 2018-05-25 DIAGNOSIS — Y832 Surgical operation with anastomosis, bypass or graft as the cause of abnormal reaction of the patient, or of later complication, without mention of misadventure at the time of the procedure: Secondary | ICD-10-CM | POA: Diagnosis not present

## 2018-05-25 DIAGNOSIS — Z85850 Personal history of malignant neoplasm of thyroid: Secondary | ICD-10-CM | POA: Diagnosis not present

## 2018-05-25 DIAGNOSIS — Z79899 Other long term (current) drug therapy: Secondary | ICD-10-CM | POA: Diagnosis not present

## 2018-05-25 DIAGNOSIS — Z940 Kidney transplant status: Secondary | ICD-10-CM | POA: Insufficient documentation

## 2018-05-25 DIAGNOSIS — D509 Iron deficiency anemia, unspecified: Secondary | ICD-10-CM | POA: Diagnosis not present

## 2018-05-25 DIAGNOSIS — I272 Pulmonary hypertension, unspecified: Secondary | ICD-10-CM | POA: Diagnosis not present

## 2018-05-25 DIAGNOSIS — N183 Chronic kidney disease, stage 3 (moderate): Secondary | ICD-10-CM | POA: Diagnosis not present

## 2018-05-25 DIAGNOSIS — D511 Vitamin B12 deficiency anemia due to selective vitamin B12 malabsorption with proteinuria: Secondary | ICD-10-CM | POA: Diagnosis not present

## 2018-05-25 DIAGNOSIS — T82898A Other specified complication of vascular prosthetic devices, implants and grafts, initial encounter: Secondary | ICD-10-CM | POA: Diagnosis not present

## 2018-05-25 DIAGNOSIS — I132 Hypertensive heart and chronic kidney disease with heart failure and with stage 5 chronic kidney disease, or end stage renal disease: Secondary | ICD-10-CM | POA: Diagnosis not present

## 2018-05-25 DIAGNOSIS — N2589 Other disorders resulting from impaired renal tubular function: Secondary | ICD-10-CM | POA: Diagnosis not present

## 2018-05-25 LAB — POCT I-STAT 4, (NA,K, GLUC, HGB,HCT)
GLUCOSE: 70 mg/dL (ref 70–99)
HCT: 36 % — ABNORMAL LOW (ref 39.0–52.0)
Hemoglobin: 12.2 g/dL — ABNORMAL LOW (ref 13.0–17.0)
Potassium: 4.5 mmol/L (ref 3.5–5.1)
SODIUM: 135 mmol/L (ref 135–145)

## 2018-05-25 SURGERY — REVISION OF IMMEDIATE STICK GRAFT
Anesthesia: Monitor Anesthesia Care | Laterality: Left

## 2018-05-25 MED ORDER — TRAMADOL HCL 50 MG PO TABS: 50.0000 mg | ORAL_TABLET | Freq: Four times a day (QID) | ORAL | 0 refills | Status: DC | PRN

## 2018-05-25 MED ORDER — SODIUM CHLORIDE 0.9 % IV SOLN
INTRAVENOUS | Status: DC | PRN
  Administered 2018-05-25: 25 ug/min via INTRAVENOUS

## 2018-05-25 MED ORDER — HEPARIN SODIUM (PORCINE) 1000 UNIT/ML IJ SOLN
INTRAMUSCULAR | Status: DC | PRN
  Administered 2018-05-25: 5000 [IU] via INTRAVENOUS

## 2018-05-25 MED ORDER — CEFAZOLIN SODIUM-DEXTROSE 2-4 GM/100ML-% IV SOLN
2.0000 g | INTRAVENOUS | Status: AC
  Administered 2018-05-25: 2 g via INTRAVENOUS
  Filled 2018-05-25: qty 100

## 2018-05-25 MED ORDER — CHLORHEXIDINE GLUCONATE 4 % EX LIQD: 60.0000 mL | Freq: Once | CUTANEOUS | Status: DC

## 2018-05-25 MED ORDER — SODIUM CHLORIDE 0.9 % IV SOLN
INTRAVENOUS | Status: DC | PRN
  Administered 2018-05-25: 500 mL

## 2018-05-25 MED ORDER — FENTANYL CITRATE (PF) 100 MCG/2ML IJ SOLN: 25.0000 ug | INTRAMUSCULAR | Status: DC | PRN

## 2018-05-25 MED ORDER — HYDROCODONE-ACETAMINOPHEN 7.5-325 MG PO TABS: 1.0000 | ORAL_TABLET | Freq: Once | ORAL | Status: DC | PRN

## 2018-05-25 MED ORDER — SODIUM CHLORIDE 0.9 % IV SOLN
INTRAVENOUS | Status: DC
  Administered 2018-05-25: 08:00:00 via INTRAVENOUS

## 2018-05-25 MED ORDER — FENTANYL CITRATE (PF) 250 MCG/5ML IJ SOLN
INTRAMUSCULAR | Status: AC
  Filled 2018-05-25: qty 5

## 2018-05-25 MED ORDER — HEPARIN SODIUM (PORCINE) 1000 UNIT/ML IJ SOLN
INTRAMUSCULAR | Status: AC
  Filled 2018-05-25: qty 1

## 2018-05-25 MED ORDER — SODIUM CHLORIDE 0.9 % IV SOLN
INTRAVENOUS | Status: AC
  Filled 2018-05-25: qty 1.2

## 2018-05-25 MED ORDER — TRAMADOL HCL 50 MG PO TABS: 50.0000 mg | ORAL_TABLET | Freq: Three times a day (TID) | ORAL | 0 refills | Status: DC | PRN

## 2018-05-25 MED ORDER — LIDOCAINE-EPINEPHRINE 1 %-1:100000 IJ SOLN
INTRAMUSCULAR | Status: AC
  Filled 2018-05-25: qty 1

## 2018-05-25 MED ORDER — ONDANSETRON HCL 4 MG/2ML IJ SOLN: 4.0000 mg | Freq: Once | INTRAMUSCULAR | Status: DC | PRN

## 2018-05-25 MED ORDER — 0.9 % SODIUM CHLORIDE (POUR BTL) OPTIME
TOPICAL | Status: DC | PRN
  Administered 2018-05-25: 1000 mL

## 2018-05-25 MED ORDER — MIDAZOLAM HCL 2 MG/2ML IJ SOLN
INTRAMUSCULAR | Status: AC
  Filled 2018-05-25: qty 2

## 2018-05-25 MED ORDER — LIDOCAINE 2% (20 MG/ML) 5 ML SYRINGE
INTRAMUSCULAR | Status: AC
  Filled 2018-05-25: qty 5

## 2018-05-25 MED ORDER — PROPOFOL 500 MG/50ML IV EMUL
INTRAVENOUS | Status: DC | PRN
  Administered 2018-05-25: 50 ug/kg/min via INTRAVENOUS

## 2018-05-25 MED ORDER — MIDAZOLAM HCL 5 MG/5ML IJ SOLN
INTRAMUSCULAR | Status: DC | PRN
  Administered 2018-05-25: 2 mg via INTRAVENOUS

## 2018-05-25 MED ORDER — FENTANYL CITRATE (PF) 100 MCG/2ML IJ SOLN
INTRAMUSCULAR | Status: DC | PRN
  Administered 2018-05-25 (×2): 50 ug via INTRAVENOUS

## 2018-05-25 MED ORDER — LIDOCAINE 2% (20 MG/ML) 5 ML SYRINGE
INTRAMUSCULAR | Status: DC | PRN
  Administered 2018-05-25: 60 mg via INTRAVENOUS

## 2018-05-25 MED ORDER — ONDANSETRON HCL 4 MG/2ML IJ SOLN
INTRAMUSCULAR | Status: DC | PRN
  Administered 2018-05-25: 4 mg via INTRAVENOUS

## 2018-05-25 MED ORDER — ONDANSETRON HCL 4 MG/2ML IJ SOLN
INTRAMUSCULAR | Status: AC
  Filled 2018-05-25: qty 2

## 2018-05-25 MED ORDER — LIDOCAINE-EPINEPHRINE (PF) 1 %-1:200000 IJ SOLN
INTRAMUSCULAR | Status: DC | PRN
  Administered 2018-05-25: 10 mL

## 2018-05-25 MED ORDER — PROPOFOL 10 MG/ML IV BOLUS
INTRAVENOUS | Status: AC
  Filled 2018-05-25: qty 20

## 2018-05-25 SURGICAL SUPPLY — 43 items
ARMBAND PINK RESTRICT EXTREMIT (MISCELLANEOUS) ×2 IMPLANT
CANISTER SUCT 3000ML PPV (MISCELLANEOUS) ×2 IMPLANT
CATH EMB 4FR 40CM (CATHETERS) ×1 IMPLANT
CLIP VESOCCLUDE MED 6/CT (CLIP) ×2 IMPLANT
CLIP VESOCCLUDE SM WIDE 6/CT (CLIP) ×2 IMPLANT
COMPONENT HERO ACCESSORY KIT (VASCULAR PRODUCTS) IMPLANT
COVER PROBE W GEL 5X96 (DRAPES) ×2 IMPLANT
COVER WAND RF STERILE (DRAPES) ×1 IMPLANT
DERMABOND ADVANCED (GAUZE/BANDAGES/DRESSINGS) ×1
DERMABOND ADVANCED .7 DNX12 (GAUZE/BANDAGES/DRESSINGS) ×1 IMPLANT
DRAPE C-ARM 42X72 X-RAY (DRAPES) ×1 IMPLANT
DRAPE CHEST BREAST 15X10 FENES (DRAPES) IMPLANT
ELECT REM PT RETURN 9FT ADLT (ELECTROSURGICAL) ×2
ELECTRODE REM PT RTRN 9FT ADLT (ELECTROSURGICAL) ×1 IMPLANT
GLOVE BIO SURGEON STRL SZ7.5 (GLOVE) ×2 IMPLANT
GLOVE SS BIOGEL STRL SZ 7 (GLOVE) IMPLANT
GLOVE SUPERSENSE BIOGEL SZ 7 (GLOVE) ×1
GOWN STRL REUS W/ TWL LRG LVL3 (GOWN DISPOSABLE) ×2 IMPLANT
GOWN STRL REUS W/ TWL XL LVL3 (GOWN DISPOSABLE) ×1 IMPLANT
GOWN STRL REUS W/TWL LRG LVL3 (GOWN DISPOSABLE) ×3
GOWN STRL REUS W/TWL XL LVL3 (GOWN DISPOSABLE) ×1
GRAFT VASC ACUSEAL 4-7X45 (Vascular Products) ×1 IMPLANT
KIT BASIN OR (CUSTOM PROCEDURE TRAY) ×2 IMPLANT
KIT TURNOVER KIT B (KITS) ×2 IMPLANT
NDL PERC 18GX7CM (NEEDLE) ×1 IMPLANT
NEEDLE PERC 18GX7CM (NEEDLE) IMPLANT
NS IRRIG 1000ML POUR BTL (IV SOLUTION) ×2 IMPLANT
PACK CV ACCESS (CUSTOM PROCEDURE TRAY) ×2 IMPLANT
PAD ARMBOARD 7.5X6 YLW CONV (MISCELLANEOUS) ×4 IMPLANT
SUT MNCRL AB 4-0 PS2 18 (SUTURE) ×3 IMPLANT
SUT PROLENE 6 0 BV (SUTURE) ×3 IMPLANT
SUT SILK 2 0 SH (SUTURE) ×1 IMPLANT
SUT VIC AB 3-0 SH 27 (SUTURE) ×2
SUT VIC AB 3-0 SH 27X BRD (SUTURE) ×1 IMPLANT
SYR 30ML LL (SYRINGE) IMPLANT
SYR 5ML LL (SYRINGE) ×1 IMPLANT
SYR TB 1ML LUER SLIP (SYRINGE) ×1 IMPLANT
TOWEL GREEN STERILE (TOWEL DISPOSABLE) ×2 IMPLANT
UNDERPAD 30X30 (UNDERPADS AND DIAPERS) ×2 IMPLANT
WATER STERILE IRR 1000ML POUR (IV SOLUTION) ×2 IMPLANT
WIRE AMPLATZ SS-J .035X180CM (WIRE) IMPLANT
WIRE BENTSON .035X145CM (WIRE) IMPLANT
WIRE J 3MM .035X145CM (WIRE) ×1 IMPLANT

## 2018-05-25 NOTE — Anesthesia Postprocedure Evaluation (Signed)
Anesthesia Post Note  Patient: Adam [name].  Procedure(s) Performed: REVISION OF HERO GRAFT  WITH ACCUSEAL LEFT UPPER ARM (Left )     Patient location during evaluation: PACU Anesthesia Type: MAC Level of consciousness: awake and alert Pain management: pain level controlled Vital Signs Assessment: post-procedure vital signs reviewed and stable Respiratory status: spontaneous breathing, nonlabored ventilation and respiratory function stable Cardiovascular status: stable and blood pressure returned to baseline Postop Assessment: no apparent nausea or vomiting Anesthetic complications: no    Last Vitals:  Vitals:   05/25/18 1044 05/25/18 1047  BP: (!) 89/64 (!) 96/59  Pulse: 71 71  Resp:  19  Temp: 36.5 C 36.5 C  SpO2: 94% 96%    Last Pain:  Vitals:   05/25/18 1044  TempSrc:   PainSc: 0-No pain                 La Shehan A.

## 2018-05-25 NOTE — Discharge Instructions (Signed)
[facility]  Discharge Instructions  AV Fistula or Graft Surgery for Dialysis Access  Please refer to the following instructions for your post-procedure care. Your surgeon or physician assistant will discuss any changes with you.  Activity  You may drive the day following your surgery, if you are comfortable and no longer taking prescription pain medication. Resume full activity as the soreness in your incision resolves.  Bathing/Showering  You may shower after you go home. Keep your incision dry for 48 hours. Do not soak in a bathtub, hot tub, or swim until the incision heals completely. You may not shower if you have a hemodialysis catheter.  Incision Care  Clean your incision with mild soap and water after 48 hours. Pat the area dry with a clean towel. You do not need a bandage unless otherwise instructed. Do not apply any ointments or creams to your incision. You may have skin glue on your incision. Do not peel it off. It will come off on its own in about one week. Your arm may swell a bit after surgery. To reduce swelling use pillows to elevate your arm so it is above your heart. Your doctor will tell you if you need to lightly wrap your arm with an ACE bandage.  Diet  Resume your normal diet. There are not special food restrictions following this procedure. In order to heal from your surgery, it is CRITICAL to get adequate nutrition. Your body requires vitamins, minerals, and protein. Vegetables are the best source of vitamins and minerals. Vegetables also provide the perfect balance of protein. Processed food has little nutritional value, so try to avoid this.  Medications  Resume taking all of your medications. If your incision is causing pain, you may take over-the counter pain relievers such as acetaminophen (Tylenol). If you were prescribed a stronger pain medication, please be aware these medications can cause nausea and constipation. Prevent  nausea by taking the medication with a snack or meal. Avoid constipation by drinking plenty of fluids and eating foods with high amount of fiber, such as fruits, vegetables, and grains.  Do not take Tylenol if you are taking prescription pain medications.  Follow up Your surgeon may want to see you in the office following your access surgery. If so, this will be arranged at the time of your surgery.  Please call us immediately for any of the following conditions:  Increased pain, redness, drainage (pus) from your incision site Fever of 101 degrees or higher Severe or worsening pain at your incision site Hand pain or numbness.  Reduce your risk of vascular disease:  Stop smoking. If you would like help, call [facility] at 1-800-QUIT-NOW 215-686-5787) or [place] at [place] your cholesterol Maintain a desired weight Control your diabetes Keep your blood pressure down  Dialysis  It will take several weeks to several months for your new dialysis access to be ready for use. Your surgeon will determine when it is okay to use it. Your nephrologist will continue to direct your dialysis. You can continue to use your Permcath until your new access is ready for use.   05/25/2018 Adam [name] [id number] 02/27/1961  Surgeon(s): Waynetta Sandy, MD  Procedure(s): REVISION OF HERO GRAFT  WITH ACCUSEAL LEFT UPPER ARM   May stick graft immediately with following instructions: 1.  First 3 sessions, use 17g needle 2.  Next 3 sessions, use 16g needle 3.  Next 3 sessions, use 15g needle 4.  After 3 weeks, go back to normal cannulation protocols for grafts.    If you have any questions, please call the office at [phone].

## 2018-05-25 NOTE — Transfer of Care (Signed)
Immediate Anesthesia Transfer of Care Note  Patient: Adam [name].  Procedure(s) Performed: REVISION OF HERO GRAFT  WITH ACCUSEAL LEFT UPPER ARM (Left )  Patient Location: PACU  Anesthesia Type:MAC  Level of Consciousness: awake, drowsy and patient cooperative  Airway & Oxygen Therapy: Patient Spontanous Breathing and Patient connected to face mask oxygen  Post-op Assessment: Report given to RN and Post -op Vital signs reviewed and stable  Post vital signs: Reviewed and stable  Last Vitals:  Vitals Value Taken Time  BP 104/71 05/25/2018 10:31 AM  Temp 36.5 C 05/25/2018 10:30 AM  Pulse 74 05/25/2018 10:39 AM  Resp 15 05/25/2018 10:39 AM  SpO2 88 % 05/25/2018 10:39 AM  Vitals shown include unvalidated device data.  Last Pain:  Vitals:   05/25/18 1030  TempSrc:   PainSc: Asleep      Patients Stated Pain Goal: 5 (82/95/62 1308)  Complications: No apparent anesthesia complications

## 2018-05-25 NOTE — Anesthesia Procedure Notes (Signed)
Procedure Name: MAC Date/Time: 05/25/2018 9:18 AM Performed by: Renato Shin, CRNA Pre-anesthesia Checklist: Patient identified, Emergency Drugs available, Suction available and Patient being monitored Patient Re-evaluated:Patient Re-evaluated prior to induction Oxygen Delivery Method: Simple face mask Preoxygenation: Pre-oxygenation with 100% oxygen Induction Type: IV induction Placement Confirmation: positive ETCO2 and breath sounds checked- equal and bilateral Dental Injury: Teeth and Oropharynx as per pre-operative assessment

## 2018-05-25 NOTE — Op Note (Signed)
    Patient name: Adam [name]. MRN: [id number] DOB: 06/06/1960 Sex: male  05/25/2018 Pre-operative Diagnosis: End-stage renal disease, thrombosed left arm arteriovenous access Post-operative diagnosis:  Same Surgeon:  Eda Paschal. Donzetta Matters, MD Assistant: Leontine Locket, PA Procedure Performed: Thrombectomy and revision of left arm hero graft with placement of new arterial limb with 4-7 mm AccuSeal  Indications: 58 year old male presents for thrombectomy of existing hero graft.  He does not have any suitable option for other dialysis access at this time.  Findings: Hero had acute fresh thrombus.  At completion we were able to establish good backbleeding from the venous aspect.  Arterial limb of the existing hero was severely diseased and so a new arterial anastomosis was created.  I completion there was a strong thrill in the graft and this can be cannulated tomorrow.  If this graft has issues he will need to have the hero exchange for tunneled catheter to be his definitive access.   Procedure:  The patient was identified in the holding area and taken to the operating room where MAC anesthesia was induced.  He sterilely prepped draped left arm in the usual fashion, antibiotics were administered and a timeout was called.  We began with anesthetizing the left upper extremity 1% lidocaine with epinephrine.  Transverse incision was then made over the deltopectoral groove where the graft was palpable.  We dissected out the graft placed a vessel loop around this.  We found an area where there is a palpable brachial pulse above the existing graft made a transverse incision dissected down placed a vessel loop around this 2.  A tunnel was placed between these 2 incisions.  5000 units of heparin was administered.  The graft in the deltopectoral groove was clamped proximally distally and transected.  There was no antegrade bleeding and so the clamp was removed from that side.  Thrombectomy was performed with 3  passes of 4 [place].  We then had good venous backbleeding was flushed with heparinized saline and reclamped.  The AcuSeal graft was then tunneled a 6 mm and was then sewn end-to-end to the existing graft.  Upon completion we then flushed through the graft and reclamped it.  The graft was then trimmed to size near the arterial anastomosis.  Artery was clamped distally proximally opened longitudinally and the graft was sewn end-to-side with 6-0 Prolene.  Prior to completion of anastomosis with all functional directions.  Upon completion there was a very strong thrill in the graft.  Satisfied we obtained hemostasis irrigated the wounds closed in layers with Vicryl Monocryl.  He was laterally from anesthesia having tolerated procedure without immediate complication.  All counts were correct at completion.  EBL: 20 cc      C. Donzetta Matters, MD [facility] Office: [phone] Pager: [phone]

## 2018-05-25 NOTE — H&P (Signed)
   History and Physical Update  The patient was interviewed and re-examined.  The patient's previous History and Physical has been reviewed and is unchanged from recent office visit. Plan for left arm HeRO revision with thrombectomy and possible exchange for catheter.   Embry Manrique C. Donzetta Matters, MD [facility] [place] Office: [phone] Pager: [phone]     05/25/2018, 7:25 AM

## 2018-05-26 ENCOUNTER — Encounter: Payer: Self-pay | Admitting: Pulmonary Disease

## 2018-05-26 ENCOUNTER — Ambulatory Visit (INDEPENDENT_AMBULATORY_CARE_PROVIDER_SITE_OTHER): Payer: Medicare HMO | Admitting: Pulmonary Disease

## 2018-05-26 VITALS — BP 90/64 | HR 75 | Ht 73.0 in | Wt 226.8 lb

## 2018-05-26 DIAGNOSIS — Z992 Dependence on renal dialysis: Secondary | ICD-10-CM | POA: Diagnosis not present

## 2018-05-26 DIAGNOSIS — N186 End stage renal disease: Secondary | ICD-10-CM | POA: Diagnosis not present

## 2018-05-26 DIAGNOSIS — I272 Pulmonary hypertension, unspecified: Secondary | ICD-10-CM | POA: Diagnosis not present

## 2018-05-26 NOTE — Progress Notes (Signed)
Subjective:    Patient ID: Adam Alm., male    DOB: Aug 02, 1960, 58 y.o.   MRN: [id number]  Synopsis: Diagnosed with pulmonary hypertension in 2012. 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 Unfortunately his kidney transplant failed for uncertain reasons by 3762. Was diagnosed with pulmonary hypertension by 2012 in [place]. Was started on [place] in [place] and moved to [place] not long afterwards. Developed atrial fibrillation around 2009, had a defibrillator placed. His dry weight was changed to 96kg in 04/2018.    08/2015 RHC [facility]: Cardiac Index (l/min/m2) 3.7 L/min/m2  Right Atrium Mean Pressure (mmHg) 5 mmHg   Right Ventricle Systolic Pressure (mmHg) 46 mmHg   Pulmonary Artery Mean Pressure (mmHg) 31 mmHg   Pulmonary Wedge Pressure (mmHg) 11 mmHg   Pulmonary Vascular Resistance (Wood units) 2.2 Wood units   He had a second evaluation for renal transplant at [facility] in 2017 but was denied because of his hypertension and atrial fibrillation.  His dry weight is 122kg, his BP is best there.   He was hospitalized in early 2018 for healthcare associated pneumonia  In 2019 he was hospitalized for a staph infection involving a septic knee joint, he had to have his vascular dialysis graft removed and his pacemaker changed.  HPI Chief Complaint  Patient presents with  . Follow-up    pt states he is doing well.    Adam has been doing fairly well since the last visit.  He had to have surgery yesterday because he had a large clot in his dialysis access.  Dr. Gwenlyn Saran was able to correct this and he now has a functioning dialysis graft which he plans to use later today.  He says that he has been struggling with some dizziness and lightheadedness during the daytime which she associates with his low blood pressure.  He says his blood pressure is low even on days when he does not receive dialysis.  He is currently taking Midrin  10 mg 3 times a day.  Past Medical History:  Diagnosis Date  . Abscess 02/2018   LEFT THIGH   . AICD (automatic cardioverter/defibrillator) present    [employer] - s/p extraction 08/21/17 due to staph infection  . Anemia   . Aortic stenosis    moderate AS by 08/2016 echo  . Arthritis    "knees" (08/05/2016)  . Atrial fibrillation ([place])   . ESRD (end stage renal disease) on dialysis [facility])    ESRD due to HTN started HD 2003, had renal Tx 2009 for 18 mos. Was on NX stage hemodialysis as of 2018 but in late 2019 is back at [facility] incenter HD.   Marland Kitchen Great toe amputation status    status post left hallux amputation 08/01/14  . History of blood transfusion 2009   "S/P biopsy for prostate cancer check"  . Hypothyroidism (acquired)   . Mitral stenosis    moderate mitral stenosis  . Nonhealing surgical wound    left thigh  . Nonischemic cardiomyopathy ([place])   . Pneumonia 2014; 08/05/2016  . Pulmonary HTN ([place])   . Renal insufficiency   . Thyroid cancer ([place]) 2011      Review of Systems  Constitutional: Positive for fatigue. Negative for chills and fever.  HENT: Negative for postnasal drip, rhinorrhea and sinus pressure.   Respiratory: Positive for shortness of breath. Negative for cough and wheezing.   Cardiovascular: Positive for leg swelling. Negative for chest  pain and palpitations.       Objective:   Physical Exam Vitals:   05/26/18 1028  BP: 90/64  Pulse: 75  SpO2: 100%  Weight: 226 lb 12.8 oz (102.9 kg)  Height: 6\' 1"  (1.854 m)   RA  Gen: chronically ill appearing HENT: OP clear, TM's clear, neck supple PULM: CTA B, normal percussion CV: RRR, loud systolic murmur, trace edema GI: BS+, soft, nontender Derm: surgical wound from yesterday in left upper extremity without redness or drainage Psyche: normal mood and affect    CBC    Component Value Date/Time   WBC 4.3 04/21/2018 1213   RBC 4.36 04/21/2018 1213   HGB 12.2 (L) 05/25/2018 0737   HGB 8.8 (L) 02/10/2018  1556   HCT 36.0 (L) 05/25/2018 0737   HCT 28.2 (L) 02/10/2018 1556   PLT 92 (L) 04/21/2018 1213   PLT 122 (L) 02/10/2018 1556   MCV 95.4 04/21/2018 1213   MCV 87 02/10/2018 1556   MCH 28.0 04/21/2018 1213   MCHC 29.3 (L) 04/21/2018 1213   RDW 19.8 (H) 04/21/2018 1213   RDW 17.7 (H) 02/10/2018 1556   LYMPHSABS 0.7 03/29/2018 0530   LYMPHSABS 0.5 (L) 02/10/2018 1556   MONOABS 0.6 03/29/2018 0530   EOSABS 0.1 03/29/2018 0530   EOSABS 0.1 02/10/2018 1556   BASOSABS 0.0 03/29/2018 0530   BASOSABS 0.0 02/10/2018 1556     6MW March 2017 6 minute walk test at [facility] m, O2 saturation 100% on room air  Echo: March 2017 echocardiogram [facility] RVSP 40 mmHg, LVEF 55% March 2018 echo [facility]: RVSP 64mm Hg 08/2017 Echo RVSP 53mmHg 02/2018 Echo RVSP 59mmHg  RHC April 2017 right heart catheterization [facility] cardiac index 3.7, right atrial pressure 5, RVSP 46, mean PA pressure 31, pulmonary capillary wedge pressure 11, pulmonary vascular resistance 2.2  Records from his physician's office from yesterday (primary care) where he was seen for cellulitis, and a complete blood count was checked and 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     Assessment & Plan:   Pulmonary hypertension ([place])  ESRD on hemodialysis [facility])  Discussion: This has been a stable interval for [name] but had some symptoms from low blood pressure for the first time since I have been seeing him.  He is always managed to get by despite low blood pressure readings but now he is starting to feel symptoms from it.  We talked about different ways to manage this as outlined below.  Pulmonary hypertension: Try taking tadalafil in the evenings for 2 weeks If you still have problems with lightheadedness or dizziness then stop taking tadalafil on dialysis days Continue ambrisentan 10 mg daily We will draw labs on the next visit to monitor for toxicity from these medicines  End-stage renal  disease on dialysis: Continue dialysis as managed by the nephrology clinic.  We will see you back in March or sooner if needed   Current Outpatient Medications:  .  albuterol (PROVENTIL) (2.5 MG/3ML) 0.083% nebulizer solution, Take 3 mLs (2.5 mg total) by nebulization every 6 (six) hours as needed for wheezing or shortness of breath., Disp: 360 mL, Rfl: 5 .  ambrisentan (LETAIRIS) 10 MG tablet, Take 1 tablet (10 mg total) by mouth daily. (Patient taking differently: Take 10 mg by mouth every evening. ), Disp: 30 tablet, Rfl: 11 .  amiodarone (PACERONE) 200 MG tablet, Take 200 mg by mouth every evening. , Disp: , Rfl:  .  atorvastatin (  LIPITOR) 40 MG tablet, Take 1 tablet (40 mg total) by mouth every evening., Disp: 30 tablet, Rfl: 2 .  cinacalcet (SENSIPAR) 90 MG tablet, Take 90 mg by mouth every evening., Disp: , Rfl:  .  levothyroxine (SYNTHROID, LEVOTHROID) 300 MCG tablet, Take 1 tablet (300 mcg total) by mouth daily before breakfast. (Patient taking differently: Take 300 mcg by mouth every evening. ), Disp: 90 tablet, Rfl: 3 .  midodrine (PROAMATINE) 10 MG tablet, TAKE 1 TABLET (10 MG TOTAL) BY MOUTH 3 (THREE) TIMES DAILY., Disp: 90 tablet, Rfl: 5 .  multivitamin (RENA-VIT) TABS tablet, TAKE 1 TABLET BY MOUTH EVERYDAY AT BEDTIME, Disp: 30 tablet, Rfl: 0 .  nystatin (MYCOSTATIN/NYSTOP) powder, Apply topically 4 (four) times daily. (Patient taking differently: Apply 1 g topically daily as needed (rash). ), Disp: 60 g, Rfl: 0 .  pantoprazole (PROTONIX) 40 MG tablet, Take 1 tablet (40 mg total) by mouth 2 (two) times daily. (Patient taking differently: Take 40 mg by mouth every evening. ), Disp: 30 tablet, Rfl: 1 .  tadalafil (ADCIRCA/CIALIS) 20 MG tablet, Take 20 mg by mouth every evening. , Disp: , Rfl:  .  traMADol (ULTRAM) 50 MG tablet, Take 1 tablet (50 mg total) by mouth every 8 (eight) hours as needed., Disp: 20 tablet, Rfl: 0

## 2018-05-26 NOTE — Patient Instructions (Signed)
Pulmonary hypertension: Try taking tadalafil in the evenings for 2 weeks If you still have problems with lightheadedness or dizziness then stop taking tadalafil on dialysis days Continue ambrisentan 10 mg daily We will draw labs on the next visit to monitor for toxicity from these medicines  End-stage renal disease on dialysis: Continue dialysis as managed by the nephrology clinic.  We will see you back in the beginning of March or sooner if needed

## 2018-05-27 DIAGNOSIS — D631 Anemia in chronic kidney disease: Secondary | ICD-10-CM | POA: Diagnosis not present

## 2018-05-27 DIAGNOSIS — E7849 Other hyperlipidemia: Secondary | ICD-10-CM | POA: Diagnosis not present

## 2018-05-27 DIAGNOSIS — N186 End stage renal disease: Secondary | ICD-10-CM | POA: Diagnosis not present

## 2018-05-27 DIAGNOSIS — N2589 Other disorders resulting from impaired renal tubular function: Secondary | ICD-10-CM | POA: Diagnosis not present

## 2018-05-27 DIAGNOSIS — D509 Iron deficiency anemia, unspecified: Secondary | ICD-10-CM | POA: Diagnosis not present

## 2018-05-27 DIAGNOSIS — E785 Hyperlipidemia, unspecified: Secondary | ICD-10-CM | POA: Diagnosis not present

## 2018-05-27 DIAGNOSIS — D511 Vitamin B12 deficiency anemia due to selective vitamin B12 malabsorption with proteinuria: Secondary | ICD-10-CM | POA: Diagnosis not present

## 2018-05-27 DIAGNOSIS — N2581 Secondary hyperparathyroidism of renal origin: Secondary | ICD-10-CM | POA: Diagnosis not present

## 2018-05-27 DIAGNOSIS — K7689 Other specified diseases of liver: Secondary | ICD-10-CM | POA: Diagnosis not present

## 2018-05-28 ENCOUNTER — Ambulatory Visit (INDEPENDENT_AMBULATORY_CARE_PROVIDER_SITE_OTHER): Payer: Medicare HMO | Admitting: Physician Assistant

## 2018-05-28 ENCOUNTER — Other Ambulatory Visit: Payer: Self-pay

## 2018-05-28 ENCOUNTER — Other Ambulatory Visit: Payer: Self-pay | Admitting: *Deleted

## 2018-05-28 ENCOUNTER — Encounter (HOSPITAL_COMMUNITY): Payer: Self-pay | Admitting: *Deleted

## 2018-05-28 VITALS — BP 96/47 | HR 88 | Temp 97.9°F | Resp 20 | Ht 73.0 in | Wt 226.0 lb

## 2018-05-28 DIAGNOSIS — E785 Hyperlipidemia, unspecified: Secondary | ICD-10-CM | POA: Diagnosis not present

## 2018-05-28 DIAGNOSIS — D631 Anemia in chronic kidney disease: Secondary | ICD-10-CM | POA: Diagnosis not present

## 2018-05-28 DIAGNOSIS — N2589 Other disorders resulting from impaired renal tubular function: Secondary | ICD-10-CM | POA: Diagnosis not present

## 2018-05-28 DIAGNOSIS — D511 Vitamin B12 deficiency anemia due to selective vitamin B12 malabsorption with proteinuria: Secondary | ICD-10-CM | POA: Diagnosis not present

## 2018-05-28 DIAGNOSIS — Z992 Dependence on renal dialysis: Secondary | ICD-10-CM

## 2018-05-28 DIAGNOSIS — K7689 Other specified diseases of liver: Secondary | ICD-10-CM | POA: Diagnosis not present

## 2018-05-28 DIAGNOSIS — D509 Iron deficiency anemia, unspecified: Secondary | ICD-10-CM | POA: Diagnosis not present

## 2018-05-28 DIAGNOSIS — N186 End stage renal disease: Secondary | ICD-10-CM

## 2018-05-28 DIAGNOSIS — E7849 Other hyperlipidemia: Secondary | ICD-10-CM | POA: Diagnosis not present

## 2018-05-28 DIAGNOSIS — N2581 Secondary hyperparathyroidism of renal origin: Secondary | ICD-10-CM | POA: Diagnosis not present

## 2018-05-28 NOTE — Progress Notes (Signed)
Established Dialysis Access   History of Present Illness   Adam [name]. is a 58 y.o. (1960-05-20) male who presents for re-evaluation of permanent access.  He is status post thrombectomy and revision of left arm hero graft with placement of new arterial limb with 4 to 7 mm AcuSeal quick stick graft.  The patient dialyzes from home with a home nurse.  He states dialysis was uneventful and graft worked well on postop day 1.  Postop day #2 which was 05/27/2018 patient states that during his dialysis treatment his arm "blew up" and felt tight and hard.  Today arm is still swollen and thus he presented to the office for evaluation.  He denies any steal symptoms of left hand.  He is painful to light touch in the area of the infiltration.  Dr. Donzetta Matters mentioned in his operative note that the patient will require exchange of hero for tunneled dialysis catheter as his long-term dialysis access if this recent revision were to fail.  Patient completed his hemodialysis treatment yesterday despite infiltration and believes he will not require dialysis until Monday.  Current Outpatient Medications  Medication Sig Dispense Refill  . albuterol (PROVENTIL) (2.5 MG/3ML) 0.083% nebulizer solution Take 3 mLs (2.5 mg total) by nebulization every 6 (six) hours as needed for wheezing or shortness of breath. 360 mL 5  . ambrisentan (LETAIRIS) 10 MG tablet Take 1 tablet (10 mg total) by mouth daily. (Patient taking differently: Take 10 mg by mouth every evening. ) 30 tablet 11  . amiodarone (PACERONE) 200 MG tablet Take 200 mg by mouth every evening.     Marland Kitchen atorvastatin (LIPITOR) 40 MG tablet Take 1 tablet (40 mg total) by mouth every evening. 30 tablet 2  . cinacalcet (SENSIPAR) 90 MG tablet Take 90 mg by mouth every evening.    Marland Kitchen levothyroxine (SYNTHROID, LEVOTHROID) 300 MCG tablet Take 1 tablet (300 mcg total) by mouth daily before breakfast. (Patient taking differently: Take 300 mcg by mouth every evening. ) 90  tablet 3  . midodrine (PROAMATINE) 10 MG tablet TAKE 1 TABLET (10 MG TOTAL) BY MOUTH 3 (THREE) TIMES DAILY. 90 tablet 5  . multivitamin (RENA-VIT) TABS tablet TAKE 1 TABLET BY MOUTH EVERYDAY AT BEDTIME 30 tablet 0  . nystatin (MYCOSTATIN/NYSTOP) powder Apply topically 4 (four) times daily. (Patient taking differently: Apply 1 g topically daily as needed (rash). ) 60 g 0  . pantoprazole (PROTONIX) 40 MG tablet Take 1 tablet (40 mg total) by mouth 2 (two) times daily. (Patient taking differently: Take 40 mg by mouth every evening. ) 30 tablet 1  . tadalafil (ADCIRCA/CIALIS) 20 MG tablet Take 20 mg by mouth every evening.     . traMADol (ULTRAM) 50 MG tablet Take 1 tablet (50 mg total) by mouth every 8 (eight) hours as needed. 20 tablet 0  . ELIQUIS 2.5 MG TABS tablet      No current facility-administered medications for this visit.     On ROS today: 10 system ROS is negative unless otherwise noted in HPI   Physical Examination   Vitals:   05/28/18 1420  BP: (!) 96/47  Pulse: 88  Resp: 20  Temp: 97.9 F (36.6 C)  SpO2: 96%  Weight: 226 lb (102.5 kg)  Height: 6\' 1"  (1.854 m)   Body mass index is 29.82 kg/m.  General Alert, O x 3, WD, NAD  Pulmonary Sym exp, good B air movt, CTA B  Cardiac RRR, Nl S1, S2,  Vascular Vessel Right Left  Radial Palpable Palpable  Brachial Palpable Palpable  Ulnar Not palpable Not palpable    Musculo- skeletal  edematous left arm with palpable hematoma in left upper arm; unable to feel thrill however there is a soft bruit with auscultation; exam limited due to pain to light touch of left upper arm  Neurologic A&O; CN grossly intact    Medical Decision Making   Adam [name]. is a 58 y.o. male who presents with ESRD requiring hemodialysis.    Dr. Donzetta Matters was involved in the evaluation and management plan of this patient during office visit today  Recently revised left arm AV graft infiltrated and currently no longer usable for  hemodialysis  Patient will require exchange for tunneled dialysis catheter, removal of hero, and possible hematoma evacuation on Monday, 05/31/2018  Eliquis will need to be held perioperatively  Patient has a known right central vein occlusion however a patent right IJ vein; after addressing his current access needs, in the future, we can potentially perform a right arm venogram to assess for the possibility of a right arm hero graft  Risks, benefits, and alternatives were explained to the patient involving his procedure scheduled for Monday, 05/31/2018 and he agrees to proceed   Dagoberto Ligas PA-C [facility] [place] Office: [phone]

## 2018-05-28 NOTE — Progress Notes (Signed)
Spoke with pt for pre-op call. Pt had surgery here Tuesday, 05/25/2018. Pt states nothing has changed with his medications, allergies, medical and surgical history. In the PA's note from this afternoon at Dr. Ainsley Spinner office it was noted that pt would need to hold his Eliquis pre-operatively. I asked pt when he was supposed to stop it and he said no one told him. I called the office but they already closed. I have called and left a message on Dr. Ainsley Spinner cell phone. I told pt I would call him if Dr. Carlis Abbott calls me back. I asked the patient how long he held Eliquis prior to the most recent surgery and he said 3 days. Dr. Carlis Abbott did call back and said for pt to hold it 48 hours. Last dose will be today. Informed pt and he voiced understanding.

## 2018-05-31 ENCOUNTER — Ambulatory Visit (HOSPITAL_COMMUNITY): Payer: Medicare HMO

## 2018-05-31 ENCOUNTER — Other Ambulatory Visit: Payer: Self-pay

## 2018-05-31 ENCOUNTER — Ambulatory Visit (HOSPITAL_COMMUNITY): Payer: Medicare HMO | Admitting: Certified Registered Nurse Anesthetist

## 2018-05-31 ENCOUNTER — Ambulatory Visit (HOSPITAL_COMMUNITY)
Admission: RE | Admit: 2018-05-31 | Discharge: 2018-05-31 | Disposition: A | Discharge status: Home / Self Care | Payer: Medicare HMO | Attending: Vascular Surgery | Admitting: Vascular Surgery

## 2018-05-31 ENCOUNTER — Encounter (HOSPITAL_COMMUNITY): Payer: Self-pay | Admitting: *Deleted

## 2018-05-31 ENCOUNTER — Encounter (HOSPITAL_COMMUNITY)
Admission: RE | Disposition: A | Discharge status: Home / Self Care | Payer: Self-pay | Source: Home / Self Care | Attending: Vascular Surgery

## 2018-05-31 DIAGNOSIS — N186 End stage renal disease: Secondary | ICD-10-CM | POA: Insufficient documentation

## 2018-05-31 DIAGNOSIS — Z825 Family history of asthma and other chronic lower respiratory diseases: Secondary | ICD-10-CM | POA: Diagnosis not present

## 2018-05-31 DIAGNOSIS — I5023 Acute on chronic systolic (congestive) heart failure: Secondary | ICD-10-CM | POA: Diagnosis not present

## 2018-05-31 DIAGNOSIS — I428 Other cardiomyopathies: Secondary | ICD-10-CM | POA: Diagnosis not present

## 2018-05-31 DIAGNOSIS — Z8249 Family history of ischemic heart disease and other diseases of the circulatory system: Secondary | ICD-10-CM | POA: Diagnosis not present

## 2018-05-31 DIAGNOSIS — Z91041 Radiographic dye allergy status: Secondary | ICD-10-CM | POA: Diagnosis not present

## 2018-05-31 DIAGNOSIS — E890 Postprocedural hypothyroidism: Secondary | ICD-10-CM | POA: Diagnosis not present

## 2018-05-31 DIAGNOSIS — D509 Iron deficiency anemia, unspecified: Secondary | ICD-10-CM | POA: Diagnosis not present

## 2018-05-31 DIAGNOSIS — M170 Bilateral primary osteoarthritis of knee: Secondary | ICD-10-CM | POA: Diagnosis not present

## 2018-05-31 DIAGNOSIS — E1122 Type 2 diabetes mellitus with diabetic chronic kidney disease: Secondary | ICD-10-CM | POA: Diagnosis not present

## 2018-05-31 DIAGNOSIS — Z95810 Presence of automatic (implantable) cardiac defibrillator: Secondary | ICD-10-CM | POA: Insufficient documentation

## 2018-05-31 DIAGNOSIS — Z992 Dependence on renal dialysis: Secondary | ICD-10-CM

## 2018-05-31 DIAGNOSIS — Z841 Family history of disorders of kidney and ureter: Secondary | ICD-10-CM | POA: Insufficient documentation

## 2018-05-31 DIAGNOSIS — Z419 Encounter for procedure for purposes other than remedying health state, unspecified: Secondary | ICD-10-CM

## 2018-05-31 DIAGNOSIS — J849 Interstitial pulmonary disease, unspecified: Secondary | ICD-10-CM | POA: Insufficient documentation

## 2018-05-31 DIAGNOSIS — X58XXXA Exposure to other specified factors, initial encounter: Secondary | ICD-10-CM | POA: Diagnosis not present

## 2018-05-31 DIAGNOSIS — Z85850 Personal history of malignant neoplasm of thyroid: Secondary | ICD-10-CM | POA: Diagnosis not present

## 2018-05-31 DIAGNOSIS — T82868A Thrombosis of vascular prosthetic devices, implants and grafts, initial encounter: Secondary | ICD-10-CM | POA: Diagnosis not present

## 2018-05-31 DIAGNOSIS — Z9884 Bariatric surgery status: Secondary | ICD-10-CM | POA: Diagnosis not present

## 2018-05-31 DIAGNOSIS — I120 Hypertensive chronic kidney disease with stage 5 chronic kidney disease or end stage renal disease: Secondary | ICD-10-CM | POA: Insufficient documentation

## 2018-05-31 DIAGNOSIS — Z89412 Acquired absence of left great toe: Secondary | ICD-10-CM | POA: Diagnosis not present

## 2018-05-31 DIAGNOSIS — N2581 Secondary hyperparathyroidism of renal origin: Secondary | ICD-10-CM | POA: Diagnosis not present

## 2018-05-31 DIAGNOSIS — Z7901 Long term (current) use of anticoagulants: Secondary | ICD-10-CM | POA: Diagnosis not present

## 2018-05-31 DIAGNOSIS — J984 Other disorders of lung: Secondary | ICD-10-CM | POA: Diagnosis not present

## 2018-05-31 DIAGNOSIS — Z7951 Long term (current) use of inhaled steroids: Secondary | ICD-10-CM | POA: Diagnosis not present

## 2018-05-31 DIAGNOSIS — D511 Vitamin B12 deficiency anemia due to selective vitamin B12 malabsorption with proteinuria: Secondary | ICD-10-CM | POA: Diagnosis not present

## 2018-05-31 DIAGNOSIS — N2589 Other disorders resulting from impaired renal tubular function: Secondary | ICD-10-CM | POA: Diagnosis not present

## 2018-05-31 DIAGNOSIS — I080 Rheumatic disorders of both mitral and aortic valves: Secondary | ICD-10-CM | POA: Diagnosis not present

## 2018-05-31 DIAGNOSIS — Z940 Kidney transplant status: Secondary | ICD-10-CM | POA: Insufficient documentation

## 2018-05-31 DIAGNOSIS — Z888 Allergy status to other drugs, medicaments and biological substances status: Secondary | ICD-10-CM | POA: Diagnosis not present

## 2018-05-31 DIAGNOSIS — K7689 Other specified diseases of liver: Secondary | ICD-10-CM | POA: Diagnosis not present

## 2018-05-31 DIAGNOSIS — E785 Hyperlipidemia, unspecified: Secondary | ICD-10-CM | POA: Diagnosis not present

## 2018-05-31 DIAGNOSIS — Z79899 Other long term (current) drug therapy: Secondary | ICD-10-CM | POA: Diagnosis not present

## 2018-05-31 DIAGNOSIS — D631 Anemia in chronic kidney disease: Secondary | ICD-10-CM | POA: Diagnosis not present

## 2018-05-31 DIAGNOSIS — E7849 Other hyperlipidemia: Secondary | ICD-10-CM | POA: Diagnosis not present

## 2018-05-31 HISTORY — PX: INSERTION OF DIALYSIS CATHETER: SHX1324

## 2018-05-31 HISTORY — PX: HEMATOMA EVACUATION: SHX5118

## 2018-05-31 HISTORY — PX: AV FISTULA PLACEMENT: SHX1204

## 2018-05-31 LAB — PROTIME-INR
INR: 1.08
Prothrombin Time: 13.9 seconds (ref 11.4–15.2)

## 2018-05-31 LAB — POCT I-STAT 4, (NA,K, GLUC, HGB,HCT)
GLUCOSE: 50 mg/dL — AB (ref 70–99)
HCT: 32 % — ABNORMAL LOW (ref 39.0–52.0)
Hemoglobin: 10.9 g/dL — ABNORMAL LOW (ref 13.0–17.0)
Potassium: 5.6 mmol/L — ABNORMAL HIGH (ref 3.5–5.1)
Sodium: 134 mmol/L — ABNORMAL LOW (ref 135–145)

## 2018-05-31 SURGERY — INSERTION OF ARTERIOVENOUS (AV) GORE-TEX GRAFT ARM
Anesthesia: General | Site: Neck | Laterality: Left

## 2018-05-31 MED ORDER — SODIUM CHLORIDE 0.9 % IV SOLN
INTRAVENOUS | Status: AC
  Filled 2018-05-31: qty 1.2

## 2018-05-31 MED ORDER — TRAMADOL HCL 50 MG PO TABS: 50.0000 mg | ORAL_TABLET | Freq: Four times a day (QID) | ORAL | 0 refills | Status: AC | PRN

## 2018-05-31 MED ORDER — OXYCODONE HCL 5 MG/5ML PO SOLN: 5.0000 mg | Freq: Once | ORAL | Status: DC | PRN

## 2018-05-31 MED ORDER — LIDOCAINE HCL (CARDIAC) PF 100 MG/5ML IV SOSY
PREFILLED_SYRINGE | INTRAVENOUS | Status: DC | PRN
  Administered 2018-05-31: 80 mg via INTRAVENOUS

## 2018-05-31 MED ORDER — HEPARIN SODIUM (PORCINE) 1000 UNIT/ML IJ SOLN
INTRAMUSCULAR | Status: DC | PRN
  Administered 2018-05-31: 3800 [IU]

## 2018-05-31 MED ORDER — ONDANSETRON HCL 4 MG/2ML IJ SOLN: 4.0000 mg | Freq: Once | INTRAMUSCULAR | Status: DC | PRN

## 2018-05-31 MED ORDER — FENTANYL CITRATE (PF) 250 MCG/5ML IJ SOLN
INTRAMUSCULAR | Status: DC | PRN
  Administered 2018-05-31: 50 ug via INTRAVENOUS
  Administered 2018-05-31 (×2): 25 ug via INTRAVENOUS

## 2018-05-31 MED ORDER — SODIUM CHLORIDE 0.9 % IV SOLN
INTRAVENOUS | Status: DC
  Administered 2018-05-31 (×2): via INTRAVENOUS

## 2018-05-31 MED ORDER — OXYCODONE HCL 5 MG PO TABS: 5.0000 mg | ORAL_TABLET | Freq: Once | ORAL | Status: DC | PRN

## 2018-05-31 MED ORDER — 0.9 % SODIUM CHLORIDE (POUR BTL) OPTIME
TOPICAL | Status: DC | PRN
  Administered 2018-05-31: 1000 mL

## 2018-05-31 MED ORDER — PHENYLEPHRINE HCL 10 MG/ML IJ SOLN
INTRAMUSCULAR | Status: DC | PRN
  Administered 2018-05-31: 80 ug via INTRAVENOUS
  Administered 2018-05-31: 120 ug via INTRAVENOUS
  Administered 2018-05-31 (×2): 80 ug via INTRAVENOUS

## 2018-05-31 MED ORDER — SODIUM CHLORIDE 0.9 % IV SOLN
INTRAVENOUS | Status: DC | PRN
  Administered 2018-05-31: 14:00:00

## 2018-05-31 MED ORDER — FENTANYL CITRATE (PF) 100 MCG/2ML IJ SOLN: 25.0000 ug | INTRAMUSCULAR | Status: DC | PRN

## 2018-05-31 MED ORDER — FENTANYL CITRATE (PF) 250 MCG/5ML IJ SOLN
INTRAMUSCULAR | Status: AC
  Filled 2018-05-31: qty 5

## 2018-05-31 MED ORDER — PROPOFOL 10 MG/ML IV BOLUS
INTRAVENOUS | Status: DC | PRN
  Administered 2018-05-31: 140 mg via INTRAVENOUS

## 2018-05-31 MED ORDER — EPHEDRINE SULFATE 50 MG/ML IJ SOLN
INTRAMUSCULAR | Status: DC | PRN
  Administered 2018-05-31: 10 mg via INTRAVENOUS
  Administered 2018-05-31: 5 mg via INTRAVENOUS
  Administered 2018-05-31: 10 mg via INTRAVENOUS

## 2018-05-31 MED ORDER — CEFAZOLIN SODIUM-DEXTROSE 2-4 GM/100ML-% IV SOLN
2.0000 g | INTRAVENOUS | Status: AC
  Administered 2018-05-31: 2 g via INTRAVENOUS
  Filled 2018-05-31: qty 100

## 2018-05-31 MED ORDER — ONDANSETRON HCL 4 MG/2ML IJ SOLN
INTRAMUSCULAR | Status: DC | PRN
  Administered 2018-05-31: 4 mg via INTRAVENOUS

## 2018-05-31 MED ORDER — HEPARIN SODIUM (PORCINE) 1000 UNIT/ML IJ SOLN
INTRAMUSCULAR | Status: AC
  Filled 2018-05-31: qty 1

## 2018-05-31 MED ORDER — SODIUM CHLORIDE 0.9 % IR SOLN: Status: DC | PRN

## 2018-05-31 MED ORDER — MIDAZOLAM HCL 2 MG/2ML IJ SOLN
INTRAMUSCULAR | Status: AC
  Filled 2018-05-31: qty 2

## 2018-05-31 MED ORDER — SODIUM CHLORIDE 0.9 % IV SOLN
INTRAVENOUS | Status: DC | PRN
  Administered 2018-05-31: 25 ug/min via INTRAVENOUS

## 2018-05-31 MED ORDER — LIDOCAINE HCL (PF) 1 % IJ SOLN
INTRAMUSCULAR | Status: AC
  Filled 2018-05-31: qty 30

## 2018-05-31 MED ORDER — VASOPRESSIN 20 UNIT/ML IV SOLN
INTRAVENOUS | Status: AC
  Filled 2018-05-31: qty 1

## 2018-05-31 MED ORDER — MIDAZOLAM HCL 5 MG/5ML IJ SOLN
INTRAMUSCULAR | Status: DC | PRN
  Administered 2018-05-31: 2 mg via INTRAVENOUS

## 2018-05-31 MED ORDER — DEXAMETHASONE SODIUM PHOSPHATE 4 MG/ML IJ SOLN
INTRAMUSCULAR | Status: DC | PRN
  Administered 2018-05-31: 4 mg via INTRAVENOUS

## 2018-05-31 SURGICAL SUPPLY — 81 items
ARMBAND PINK RESTRICT EXTREMIT (MISCELLANEOUS) ×5 IMPLANT
BAG DECANTER FOR FLEXI CONT (MISCELLANEOUS) ×3 IMPLANT
BANDAGE ELASTIC 4 VELCRO ST LF (GAUZE/BANDAGES/DRESSINGS) ×1 IMPLANT
BANDAGE ESMARK 6X9 LF (GAUZE/BANDAGES/DRESSINGS) IMPLANT
BIOPATCH RED 1 DISK 7.0 (GAUZE/BANDAGES/DRESSINGS) ×3 IMPLANT
BNDG ESMARK 6X9 LF (GAUZE/BANDAGES/DRESSINGS)
CANISTER SUCT 3000ML PPV (MISCELLANEOUS) ×3 IMPLANT
CATH PALINDROME RT-P 15FX19CM (CATHETERS) IMPLANT
CATH PALINDROME RT-P 15FX23CM (CATHETERS) IMPLANT
CATH PALINDROME RT-P 15FX28CM (CATHETERS) ×1 IMPLANT
CATH PALINDROME RT-P 15FX55CM (CATHETERS) IMPLANT
CATH STRAIGHT 5FR 65CM (CATHETERS) IMPLANT
CLIP VESOCCLUDE MED 6/CT (CLIP) ×3 IMPLANT
CLIP VESOCCLUDE SM WIDE 6/CT (CLIP) ×3 IMPLANT
COVER PROBE W GEL 5X96 (DRAPES) ×3 IMPLANT
COVER SURGICAL LIGHT HANDLE (MISCELLANEOUS) ×3 IMPLANT
COVER WAND RF STERILE (DRAPES) ×2 IMPLANT
CUFF TOURNIQUET SINGLE 18IN (TOURNIQUET CUFF) IMPLANT
CUFF TOURNIQUET SINGLE 24IN (TOURNIQUET CUFF) IMPLANT
CUFF TOURNIQUET SINGLE 34IN LL (TOURNIQUET CUFF) IMPLANT
CUFF TOURNIQUET SINGLE 44IN (TOURNIQUET CUFF) IMPLANT
DECANTER SPIKE VIAL GLASS SM (MISCELLANEOUS) ×3 IMPLANT
DERMABOND ADVANCED (GAUZE/BANDAGES/DRESSINGS) ×2
DERMABOND ADVANCED .7 DNX12 (GAUZE/BANDAGES/DRESSINGS) ×2 IMPLANT
DRAIN CHANNEL 15F RND FF W/TCR (WOUND CARE) ×2 IMPLANT
DRAIN CHANNEL 19F RND (DRAIN) IMPLANT
DRAPE C-ARM 42X72 X-RAY (DRAPES) ×3 IMPLANT
DRAPE CHEST BREAST 15X10 FENES (DRAPES) ×2 IMPLANT
DRAPE ORTHO SPLIT 77X108 STRL (DRAPES) ×1
DRAPE SURG ORHT 6 SPLT 77X108 (DRAPES) IMPLANT
DRSG COVADERM 4X6 (GAUZE/BANDAGES/DRESSINGS) ×1 IMPLANT
ELECT REM PT RETURN 9FT ADLT (ELECTROSURGICAL) ×3
ELECTRODE REM PT RTRN 9FT ADLT (ELECTROSURGICAL) ×2 IMPLANT
EVACUATOR SILICONE 100CC (DRAIN) ×1 IMPLANT
GAUZE 4X4 16PLY RFD (DISPOSABLE) ×3 IMPLANT
GAUZE SPONGE 2X2 8PLY STRL LF (GAUZE/BANDAGES/DRESSINGS) IMPLANT
GAUZE SPONGE 4X4 12PLY STRL (GAUZE/BANDAGES/DRESSINGS) ×1 IMPLANT
GLOVE BIO SURGEON STRL SZ 6.5 (GLOVE) ×2 IMPLANT
GLOVE BIO SURGEON STRL SZ7.5 (GLOVE) ×4 IMPLANT
GLOVE BIOGEL PI IND STRL 6.5 (GLOVE) IMPLANT
GLOVE BIOGEL PI IND STRL 7.0 (GLOVE) IMPLANT
GLOVE BIOGEL PI IND STRL 8 (GLOVE) ×2 IMPLANT
GLOVE BIOGEL PI INDICATOR 6.5 (GLOVE) ×2
GLOVE BIOGEL PI INDICATOR 7.0 (GLOVE) ×1
GLOVE BIOGEL PI INDICATOR 8 (GLOVE) ×1
GOWN STRL REUS W/ TWL LRG LVL3 (GOWN DISPOSABLE) ×4 IMPLANT
GOWN STRL REUS W/ TWL XL LVL3 (GOWN DISPOSABLE) ×4 IMPLANT
GOWN STRL REUS W/TWL LRG LVL3 (GOWN DISPOSABLE) ×2
GOWN STRL REUS W/TWL XL LVL3 (GOWN DISPOSABLE) ×2
HEMOSTAT SPONGE AVITENE ULTRA (HEMOSTASIS) IMPLANT
KIT BASIN OR (CUSTOM PROCEDURE TRAY) ×3 IMPLANT
KIT TURNOVER KIT B (KITS) ×3 IMPLANT
NDL 18GX1X1/2 (RX/OR ONLY) (NEEDLE) ×2 IMPLANT
NDL HYPO 25GX1X1/2 BEV (NEEDLE) ×2 IMPLANT
NEEDLE 18GX1X1/2 (RX/OR ONLY) (NEEDLE) ×3 IMPLANT
NEEDLE HYPO 25GX1X1/2 BEV (NEEDLE) IMPLANT
NS IRRIG 1000ML POUR BTL (IV SOLUTION) ×5 IMPLANT
PACK CV ACCESS (CUSTOM PROCEDURE TRAY) ×3 IMPLANT
PAD ARMBOARD 7.5X6 YLW CONV (MISCELLANEOUS) ×6 IMPLANT
SET MICROPUNCTURE 5F STIFF (MISCELLANEOUS) IMPLANT
SOAP 2 % CHG 4 OZ (WOUND CARE) ×3 IMPLANT
SPONGE GAUZE 2X2 STER 10/PKG (GAUZE/BANDAGES/DRESSINGS) ×1
STAPLER VISISTAT 35W (STAPLE) IMPLANT
SUT ETHILON 3 0 PS 1 (SUTURE) ×6 IMPLANT
SUT MNCRL AB 4-0 PS2 18 (SUTURE) ×5 IMPLANT
SUT PROLENE 5 0 C 1 24 (SUTURE) IMPLANT
SUT PROLENE 5 0 C 1 36 (SUTURE) ×4 IMPLANT
SUT PROLENE 6 0 BV (SUTURE) ×3 IMPLANT
SUT VIC AB 2-0 CT1 27 (SUTURE) ×1
SUT VIC AB 2-0 CT1 TAPERPNT 27 (SUTURE) IMPLANT
SUT VIC AB 3-0 SH 27 (SUTURE) ×3
SUT VIC AB 3-0 SH 27X BRD (SUTURE) ×4 IMPLANT
SYR 10ML LL (SYRINGE) ×3 IMPLANT
SYR 20CC LL (SYRINGE) ×5 IMPLANT
SYR 5ML LL (SYRINGE) ×3 IMPLANT
TAPE CLOTH SURG 4X10 WHT LF (GAUZE/BANDAGES/DRESSINGS) ×1 IMPLANT
TOWEL GREEN STERILE (TOWEL DISPOSABLE) ×3 IMPLANT
TOWEL GREEN STERILE FF (TOWEL DISPOSABLE) ×3 IMPLANT
UNDERPAD 30X30 (UNDERPADS AND DIAPERS) ×3 IMPLANT
WATER STERILE IRR 1000ML POUR (IV SOLUTION) ×3 IMPLANT
WIRE AMPLATZ SS-J .035X180CM (WIRE) ×1 IMPLANT

## 2018-05-31 NOTE — Anesthesia Procedure Notes (Signed)
Procedure Name: LMA Insertion Date/Time: 05/31/2018 2:07 PM Performed by: Glynda Jaeger, CRNA Pre-anesthesia Checklist: Patient identified, Emergency Drugs available, Suction available and Patient being monitored Patient Re-evaluated:Patient Re-evaluated prior to induction Oxygen Delivery Method: Circle System Utilized Preoxygenation: Pre-oxygenation with 100% oxygen Induction Type: IV induction Ventilation: Mask ventilation without difficulty LMA: LMA inserted LMA Size: 4.0 Number of attempts: 1 Airway Equipment and Method: Bite block Placement Confirmation: positive ETCO2 Tube secured with: Tape Dental Injury: Teeth and Oropharynx as per pre-operative assessment

## 2018-05-31 NOTE — H&P (Signed)
History and Physical Interval Note:  05/31/2018 1:56 PM  Adam Alm.  has presented today for surgery, with the diagnosis of failed dialysis graft  The various methods of treatment have been discussed with the patient and family. After consideration of risks, benefits and other options for treatment, the patient has consented to  Procedure(s): EXCHANGE OF ARTERIOVENOUS (AV) GORE-TEX GRAFT ARM (Left) INSERTION OF DIALYSIS CATHETER (N/A) EVACUATION HEMATOMA (Left) as a surgical intervention .  The patient's history has been reviewed, patient examined, no change in status, stable for surgery.  I have reviewed the patient's chart and labs.  Questions were answered to the patient's satisfaction.    Exchange of HERO for tunneled dialysis catheter, possible removal of HERO, possible left arm hematoma evacuation.  Adam [name]  Established Dialysis Access   History of Present Illness   Adam Musson. is a 58 y.o. (Aug 25, 1960) male who presents for re-evaluation of permanent access.  He is status post thrombectomy and revision of left arm hero graft with placement of new arterial limb with 4 to 7 mm AcuSeal quick stick graft.  The patient dialyzes from home with a home nurse.  He states dialysis was uneventful and graft worked well on postop day 1.  Postop day #2 which was 05/27/2018 patient states that during his dialysis treatment his arm "blew up" and felt tight and hard.  Today arm is still swollen and thus he presented to the office for evaluation.  He denies any steal symptoms of left hand.  He is painful to light touch in the area of the infiltration.  Dr. Donzetta Matters mentioned in his operative note that the patient will require exchange of hero for tunneled dialysis catheter as his long-term dialysis access if this recent revision were to fail.  Patient completed his hemodialysis treatment yesterday despite infiltration and believes he will not require dialysis until Monday.         Current Outpatient Medications  Medication Sig Dispense Refill  . albuterol (PROVENTIL) (2.5 MG/3ML) 0.083% nebulizer solution Take 3 mLs (2.5 mg total) by nebulization every 6 (six) hours as needed for wheezing or shortness of breath. 360 mL 5  . ambrisentan (LETAIRIS) 10 MG tablet Take 1 tablet (10 mg total) by mouth daily. (Patient taking differently: Take 10 mg by mouth every evening. ) 30 tablet 11  . amiodarone (PACERONE) 200 MG tablet Take 200 mg by mouth every evening.     Marland Kitchen atorvastatin (LIPITOR) 40 MG tablet Take 1 tablet (40 mg total) by mouth every evening. 30 tablet 2  . cinacalcet (SENSIPAR) 90 MG tablet Take 90 mg by mouth every evening.    Marland Kitchen levothyroxine (SYNTHROID, LEVOTHROID) 300 MCG tablet Take 1 tablet (300 mcg total) by mouth daily before breakfast. (Patient taking differently: Take 300 mcg by mouth every evening. ) 90 tablet 3  . midodrine (PROAMATINE) 10 MG tablet TAKE 1 TABLET (10 MG TOTAL) BY MOUTH 3 (THREE) TIMES DAILY. 90 tablet 5  . multivitamin (RENA-VIT) TABS tablet TAKE 1 TABLET BY MOUTH EVERYDAY AT BEDTIME 30 tablet 0  . nystatin (MYCOSTATIN/NYSTOP) powder Apply topically 4 (four) times daily. (Patient taking differently: Apply 1 g topically daily as needed (rash). ) 60 g 0  . pantoprazole (PROTONIX) 40 MG tablet Take 1 tablet (40 mg total) by mouth 2 (two) times daily. (Patient taking differently: Take 40 mg by mouth every evening. ) 30 tablet 1  . tadalafil (ADCIRCA/CIALIS) 20 MG tablet Take 20 mg by mouth  every evening.     . traMADol (ULTRAM) 50 MG tablet Take 1 tablet (50 mg total) by mouth every 8 (eight) hours as needed. 20 tablet 0  . ELIQUIS 2.5 MG TABS tablet      No current facility-administered medications for this visit.     On ROS today: 10 system ROS is negative unless otherwise noted in HPI   Physical Examination      Vitals:   05/28/18 1420  BP: (!) 96/47  Pulse: 88  Resp: 20  Temp: 97.9 F (36.6 C)  SpO2: 96%   Weight: 226 lb (102.5 kg)  Height: 6\' 1"  (1.854 m)   Body mass index is 29.82 kg/m.  General Alert, O x 3, WD, NAD  Pulmonary Sym exp, good B air movt, CTA B  Cardiac RRR, Nl S1, S2,   Vascular Vessel Right Left  Radial Palpable Palpable  Brachial Palpable Palpable  Ulnar Not palpable Not palpable    Musculo- skeletal  edematous left arm with palpable hematoma in left upper arm; unable to feel thrill however there is a soft bruit with auscultation; exam limited due to pain to light touch of left upper arm  Neurologic A&O; CN grossly intact    Medical Decision Making   Adam Rotundo. is a 58 y.o. male who presents with ESRD requiring hemodialysis.    Dr. Donzetta Matters was involved in the evaluation and management plan of this patient during office visit today  Recently revised left arm AV graft infiltrated and currently no longer usable for hemodialysis  Patient will require exchange for tunneled dialysis catheter, removal of hero, and possible hematoma evacuation on Monday, 05/31/2018  Eliquis will need to be held perioperatively  Patient has a known right central vein occlusion however a patent right IJ vein; after addressing his current access needs, in the future, we can potentially perform a right arm venogram to assess for the possibility of a right arm hero graft  Risks, benefits, and alternatives were explained to the patient involving his procedure scheduled for Monday, 05/31/2018 and he agrees to proceed   Adam Ligas PA-C [facility] [place] Office: [phone]

## 2018-05-31 NOTE — Discharge Instructions (Signed)
Surgical Drain Home Care °Surgical drains are used to remove extra fluid that normally builds up in a surgical wound after surgery. A surgical drain helps to heal a surgical wound. Different kinds of surgical drains include: °· Active drains. These drains use suction to pull drainage away from the surgical wound. Drainage flows through a tube to a container outside of the body. It is important to keep the bulb or the drainage container flat (compressed) at all times, except while you empty it. Flattening the bulb or container creates suction. The two most common types of active drains are bulb drains and Hemovac drains. °· Passive drains. These drains allow fluid to drain naturally, by gravity. Drainage flows through a tube to a bandage (dressing) or a container outside of the body. Passive drains do not need to be emptied. The most common type of passive drain is the Penrose drain. °A drain is placed during surgery. Immediately after surgery, drainage is usually bright red and a little thicker than water. The drainage may gradually turn yellow or pink and become thinner. It is likely that your health care provider will remove the drain when the drainage stops or when the amount decreases to 1-2 Tbsp (15-30 mL) during a 24-hour period. °How to care for your surgical drain °It is important to care for your drain to prevent infection. If your drain is placed at your back, or any other hard-to-reach area, ask another person to assist you in performing the following steps: °· Keep the skin around the drain dry and covered with a dressing at all times. °· Check your drain area every day for signs of infection. Check for: °? More redness, swelling, or pain. °? Pus or a bad smell. °? Cloudy drainage. °Follow instructions from your health care provider about how to take care of your drain and how to change your dressing. Change your dressing at least one time every day. Change it more often if needed to keep the dressing  dry. Make sure you: °1. Gather your supplies, including: °? Tape. °? Germ-free cleaning solution (sterile saline). °? Split gauze drain sponge: 4 x 4 inches (10 x 10 cm). °? Gauze square: 4 x 4 inches (10 x 10 cm). °2. Wash your hands with soap and water before you change your dressing. If soap and water are not available, use hand sanitizer. °3. Remove the old dressing. Avoid using scissors to do that. °4. Use sterile saline to clean your skin around the drain. °5. Place the tube through the slit in a drain sponge. Place the drain sponge so that it covers your wound. °6. Place the gauze square or another drain sponge on top of the drain sponge that is on the wound. Make sure the tube is between those layers. °7. Tape the dressing to your skin. °8. If you have an active bulb or Hemovac drain, tape the drainage tube to your skin 1-2 inches (2.5-5 cm) below the place where the tube enters your body. Taping keeps the tube from pulling on any stitches (sutures) that you have. °9. Wash your hands with soap and water. °10. Write down the color of your drainage and how often you change your dressing. °How to empty your active bulb or Hemovac drain ° °1. Make sure that you have a measuring cup that you can empty your drainage into. °2. Wash your hands with soap and water. If soap and water are not available, use hand sanitizer. °3. Gently move your fingers down the   tube while squeezing very lightly. This is called stripping the tube. This clears any drainage, clots, or tissue from the tube. °? Do not pull on the tube. °? You may need to strip the tube several times every day to keep the tube clear. °4. Open the bulb cap or the drain plug. Do not touch the inside of the cap or the bottom of the plug. °5. Empty all of the drainage into the measuring cup. °6. Compress the bulb or the container and replace the cap or the plug. To compress the bulb or the container, squeeze it firmly in the middle while you close the cap or plug  the container. °7. Write down the amount of drainage that you have in each 24-hour period. If you have less than 2 Tbsp (30 mL) of drainage during 24 hours, contact your health care provider. °8. Flush the drainage down the toilet. °9. Wash your hands with soap and water. °Contact a health care provider if: °· You have more redness, swelling, or pain around your drain area. °· The amount of drainage that you have is increasing instead of decreasing. °· You have pus or a bad smell coming from your drain area. °· You have a fever. °· You have drainage that is cloudy. °· There is a sudden stop or a sudden decrease in the amount of drainage that you have. °· Your tube falls out. °· Your active drain does not stay compressed after you empty it. °Summary °· Surgical drains are used to remove extra fluid that normally builds up in a surgical wound after surgery. °· Different kinds of surgical drains include active drains and passive drains. Active drains use suction to pull drainage away from the surgical wound, and passive drains allow fluid to drain naturally. °· It is important to care for your drain to prevent infection. If your drain is placed at your back, or any other hard-to-reach area, ask another person to assist you. °· Contact your health care provider if you have more redness, swelling, or pain around your drain area. °This information is not intended to replace advice given to you by your health care provider. Make sure you discuss any questions you have with your health care provider. °Document Released: 04/25/2000 Document Revised: 05/21/2017 Document Reviewed: 11/15/2014 °Elsevier Interactive Patient Education © 2019 [employer] ° °

## 2018-05-31 NOTE — Anesthesia Preprocedure Evaluation (Addendum)
Anesthesia Evaluation  Patient identified by MRN, date of birth, ID band Patient awake    Reviewed: Allergy & Precautions, NPO status , Patient's Chart, lab work & pertinent test results  Airway Mallampati: II  TM Distance: >3 FB Neck ROM: Full    Dental  (+) Dental Advisory Given, Edentulous Upper, Poor Dentition   Pulmonary    breath sounds clear to auscultation       Cardiovascular  Rhythm:Regular Rate:Normal     Neuro/Psych    GI/Hepatic   Endo/Other    Renal/GU      Musculoskeletal   Abdominal   Peds  Hematology   Anesthesia Other Findings   Reproductive/Obstetrics                            Anesthesia Physical Anesthesia Plan  ASA: III  Anesthesia Plan: General   Post-op Pain Management:    Induction: Intravenous  PONV Risk Score and Plan: Ondansetron and Dexamethasone  Airway Management Planned: LMA  Additional Equipment:   Intra-op Plan:   Post-operative Plan:   Informed Consent: I have reviewed the patients History and Physical, chart, labs and discussed the procedure including the risks, benefits and alternatives for the proposed anesthesia with the patient or authorized representative who has indicated his/her understanding and acceptance.     Dental advisory given  Plan Discussed with: CRNA and Anesthesiologist  Anesthesia Plan Comments:        Anesthesia Quick Evaluation

## 2018-05-31 NOTE — Progress Notes (Signed)
Spoke with Dr. Linna Caprice regarding patients iSTAT results- glucose of 50. Patient is not diabetic and asymptomatic. Will continue to monitor.

## 2018-05-31 NOTE — Anesthesia Postprocedure Evaluation (Signed)
Anesthesia Post Note  Patient: Adam [name].  Procedure(s) Performed: EXCHANGE OF ARTERIOVENOUS (AV) GORE-TEX GRAFT ARM (Left Arm Upper) INSERTION OF DIALYSIS CATHETER (Left Neck) EVACUATION HEMATOMA (Left Arm Upper)     Patient location during evaluation: PACU Anesthesia Type: General Level of consciousness: awake and alert and oriented Pain management: pain level controlled Vital Signs Assessment: post-procedure vital signs reviewed and stable Respiratory status: spontaneous breathing, nonlabored ventilation and respiratory function stable Cardiovascular status: stable and blood pressure returned to baseline Postop Assessment: no apparent nausea or vomiting Anesthetic complications: no    Last Vitals:  Vitals:   05/31/18 1622 05/31/18 1637  BP: 123/65 (!) 110/59  Pulse: 76 72  Resp: 14 20  Temp: 36.4 C   SpO2: 96% 98%    Last Pain:  Vitals:   05/31/18 1622  TempSrc:   PainSc: 0-No pain                 Dabney Dever A.

## 2018-05-31 NOTE — Transfer of Care (Signed)
Immediate Anesthesia Transfer of Care Note  Patient: Adam [name].  Procedure(s) Performed: EXCHANGE OF ARTERIOVENOUS (AV) GORE-TEX GRAFT ARM (Left Arm Upper) INSERTION OF DIALYSIS CATHETER (Left Neck) EVACUATION HEMATOMA (Left Arm Upper)  Patient Location: PACU  Anesthesia Type:General  Level of Consciousness: awake, oriented and patient cooperative  Airway & Oxygen Therapy: Patient Spontanous Breathing and Patient connected to nasal cannula oxygen  Post-op Assessment: Report given to RN and Post -op Vital signs reviewed and stable  Post vital signs: Reviewed  Last Vitals:  Vitals Value Taken Time  BP    Temp    Pulse    Resp    SpO2      Last Pain:  Vitals:   05/31/18 1244  TempSrc:   PainSc: 7       Patients Stated Pain Goal: 5 (70/34/03 5248)  Complications: No apparent anesthesia complications

## 2018-05-31 NOTE — Op Note (Signed)
Date: May 31, 2018  Preoperative diagnosis: Thrombosed left arm hero graft after recent revision with subsequent left arm hematoma  Postoperative diagnosis: Same  Procedure: 1.  Exchange of left IJ HERO graft for a 28 cm tunneled palindrome dialysis catheter 2.  Evacuation of left arm hematoma with resection of new AcuSeal arterial limb (excluding small remnant cuff) with 15 French blake drain placement  Surgeon: Dr. Marty Heck, MD  Assistant: Arlee Muslim, PA  Indications: Patient is a 58 year old male with multiple medical comorbidities including end-stage renal disease that was dialyzing via a left upper extremity hero graft.  He recently underwent revision of the graft last week with Dr. Donzetta Matters including thrombectomy and revision with a new arterial limb placement.  He presented to clinic on Friday with left arm hematoma as well as apparent thrombosis of the graft that was no longer functioning after suspected that it may have infiltrated.  He presents today for exchange of the catheter for a tunneled dialysis catheter and possible washout of hematoma.  Findings: Counterincisions were made over the left IJ where the venous limb of the hero graft was palpable as well as over the deltopectoral groove where recent incision was made for revision of the arterial limb.  Through these incisions we transected the venous limb of the hero graft and then placed a wire into the right atrium through the left IJ approach and exchanged for a 28 cm tunneled palindrome catheter that flushed easily. We then went down and washed out the left arm through his previous incision for brachial artery exposure since he was having compressive symptoms.  Ultimately there was a large hematoma cavity that extended up into the axilla on top of the muscle.  The new arterial AcuSeal graft was resected except for a small cuff that was left on the brachial artery.  I did close muscle over top of the graft cuff and  then separately tunneled a 15 [place] bard drain through a separate stab incision up into the axilla away from the graft remnant on top of the muscle.  Anesthesia: General  Details: Patient was taken to the operating room after informed consent was obtained.  General anesthesia was induced.  His left neck as well as his chest wall and left arm were prepped and draped in usual sterile fashion.  I initially made a transverse incision over the venous limb of the hero graft at the previous left IJ stick site.  I then dissected down with Bovie cautery and blunt dissection and got a vessel loop around this.  Unfortunately given that this was fairly scarred and I was worried about having enough length for an exchange over a wire.  As a result I then opened the previous incision over the deltopectoral groove as well and dissected down to where I found the new arterial limb sewn into the old hero graft.  Ultimately I clamped here and then transected the venous portion of the hero graft near the IJ stents incision.  I then passed a wire down the venous end of the hero graft into the right atrium under fluoroscopic guidance and remove the venous cannula.  Over this I used serial dilators then placed a large peel-away sheath under fluoroscopic guidance into the right atrium.  I then brought a 28 cm palindrome catheter and tunneled it through a separate stab incision in the chest wall and then placed the tip of the catheter in the peel-away sheath and the sheath was peeled away placing the  tip of the catheter into the right atrium.  We tested the catheter and it flushed and withdrew easily.  I then closed the incision over the IJ incision with several layers of 4-0 Monocryl running and additional pressure was held.  I then went down and washed out the deltopectoral groove incision and then try dissecting out the rest of the old graft but it was very scarred and and I do not feel that it was necessary to remove the whole graft  since it was not infected and the patient's will go back on anticoagulation.   At that point in time we went down to the previous brachial incision where the new arterial limb was placed.  The previous incision was opened with a scalpel and there was significant hematoma that was evacuated under pressure.  There was a large cavity that had dissected up into the axilla over the muscle and this was all washed out removing multiple chunks of old hematoma.  Ultimately I went down and put a clamp proximally on the graft at the arterial anastomosis and then transected the graft and we pulled the remainder of the new arterial limb out of the incision from the deltopectoral groove counterincision.  I then oversewed the remnant stump of graft on the artery with a 5-0 Prolene in baseball stitch and then running format.  The wound was then copiously irrigated.  I debated the best maneuver to close this and ultimately used a 3-0 Vicryl to close muscle fascia/muscle over top of the brachial artery and remnant graft.  I then through a separate stab incision tunneled a 15 round blake drain up into the axillary dead space.  This was secured with a drain stitch 3-0 Nylon.  This subcutaneous and skin incision of the brachial artery exposure was then closed with a 3-0 Vicryl and 4-0 Monocryl and Dermabond. This deltopectoral groove incision was then closed with deep layer of 3-0 Vicryl and 4-0 Monocryl and Dermabond.   Condition: Stable  Marty Heck, MD [facility] Office: [phone] Pager: [place]

## 2018-06-01 ENCOUNTER — Emergency Department (HOSPITAL_COMMUNITY): Payer: Medicare HMO

## 2018-06-01 ENCOUNTER — Encounter (HOSPITAL_COMMUNITY): Payer: Self-pay | Admitting: Vascular Surgery

## 2018-06-01 ENCOUNTER — Telehealth: Payer: Self-pay | Admitting: *Deleted

## 2018-06-01 ENCOUNTER — Emergency Department (HOSPITAL_COMMUNITY)
Admission: EM | Admit: 2018-06-01 | Discharge: 2018-06-12 | Disposition: E | Payer: Medicare HMO | Attending: Emergency Medicine | Admitting: Emergency Medicine

## 2018-06-01 DIAGNOSIS — R092 Respiratory arrest: Secondary | ICD-10-CM

## 2018-06-01 DIAGNOSIS — E872 Acidosis, unspecified: Secondary | ICD-10-CM | POA: Insufficient documentation

## 2018-06-01 DIAGNOSIS — I469 Cardiac arrest, cause unspecified: Secondary | ICD-10-CM | POA: Diagnosis not present

## 2018-06-01 DIAGNOSIS — Z7901 Long term (current) use of anticoagulants: Secondary | ICD-10-CM | POA: Insufficient documentation

## 2018-06-01 DIAGNOSIS — E875 Hyperkalemia: Secondary | ICD-10-CM

## 2018-06-01 DIAGNOSIS — R000 Tachycardia, unspecified: Secondary | ICD-10-CM | POA: Diagnosis not present

## 2018-06-01 DIAGNOSIS — R404 Transient alteration of awareness: Secondary | ICD-10-CM | POA: Diagnosis not present

## 2018-06-01 DIAGNOSIS — R9431 Abnormal electrocardiogram [ECG] [EKG]: Secondary | ICD-10-CM | POA: Diagnosis not present

## 2018-06-01 DIAGNOSIS — I499 Cardiac arrhythmia, unspecified: Secondary | ICD-10-CM | POA: Diagnosis not present

## 2018-06-01 DIAGNOSIS — I959 Hypotension, unspecified: Secondary | ICD-10-CM | POA: Diagnosis not present

## 2018-06-01 DIAGNOSIS — Z4682 Encounter for fitting and adjustment of non-vascular catheter: Secondary | ICD-10-CM | POA: Diagnosis not present

## 2018-06-01 DIAGNOSIS — I10 Essential (primary) hypertension: Secondary | ICD-10-CM | POA: Diagnosis not present

## 2018-06-01 LAB — BASIC METABOLIC PANEL
Anion gap: 21 — ABNORMAL HIGH (ref 5–15)
BUN: 82 mg/dL — ABNORMAL HIGH (ref 6–20)
CO2: 14 mmol/L — ABNORMAL LOW (ref 22–32)
Calcium: 9 mg/dL (ref 8.9–10.3)
Chloride: 104 mmol/L (ref 98–111)
Creatinine, Ser: 11.55 mg/dL — ABNORMAL HIGH (ref 0.61–1.24)
GFR calc Af Amer: 5 mL/min — ABNORMAL LOW (ref >60)
GFR calc non Af Amer: 4 mL/min — ABNORMAL LOW (ref >60)
GLUCOSE: 251 mg/dL — AB (ref 70–99)
Potassium: 5.7 mmol/L — ABNORMAL HIGH (ref 3.5–5.1)
Sodium: 139 mmol/L (ref 135–145)

## 2018-06-01 LAB — I-STAT ARTERIAL BLOOD GAS, ED
ACID-BASE DEFICIT: 19 mmol/L — AB (ref 0.0–2.0)
Bicarbonate: 11.4 mmol/L — ABNORMAL LOW (ref 20.0–28.0)
O2 Saturation: 89 %
Patient temperature: 95.6
TCO2: 13 mmol/L — AB (ref 22–32)
pCO2 arterial: 39.7 mmHg (ref 32.0–48.0)
pH, Arterial: 7.054 — CL (ref 7.350–7.450)
pO2, Arterial: 73 mmHg — ABNORMAL LOW (ref 83.0–108.0)

## 2018-06-01 LAB — CBC WITH DIFFERENTIAL/PLATELET
ABS IMMATURE GRANULOCYTES: 1.19 10*3/uL — AB (ref 0.00–0.07)
Basophils Absolute: 0.1 10*3/uL (ref 0.0–0.1)
Basophils Relative: 0 %
Eosinophils Absolute: 0 10*3/uL (ref 0.0–0.5)
Eosinophils Relative: 0 %
HCT: 35.1 % — ABNORMAL LOW (ref 39.0–52.0)
HEMOGLOBIN: 9.6 g/dL — AB (ref 13.0–17.0)
Immature Granulocytes: 6 %
LYMPHS PCT: 21 %
Lymphs Abs: 4.5 10*3/uL — ABNORMAL HIGH (ref 0.7–4.0)
MCH: 28.2 pg (ref 26.0–34.0)
MCHC: 27.4 g/dL — ABNORMAL LOW (ref 30.0–36.0)
MCV: 103.2 fL — ABNORMAL HIGH (ref 80.0–100.0)
Monocytes Absolute: 0.5 10*3/uL (ref 0.1–1.0)
Monocytes Relative: 2 %
Neutro Abs: 15.1 10*3/uL — ABNORMAL HIGH (ref 1.7–7.7)
Neutrophils Relative %: 71 %
Platelets: 150 10*3/uL (ref 150–400)
RBC: 3.4 MIL/uL — ABNORMAL LOW (ref 4.22–5.81)
RDW: 16.3 % — ABNORMAL HIGH (ref 11.5–15.5)
WBC Morphology: INCREASED
WBC: 21.3 10*3/uL — ABNORMAL HIGH (ref 4.0–10.5)
nRBC: 0 % (ref 0.0–0.2)

## 2018-06-01 LAB — APTT: aPTT: 71 seconds — ABNORMAL HIGH (ref 24–36)

## 2018-06-01 LAB — PROTIME-INR
INR: 1.61
Prothrombin Time: 19 seconds — ABNORMAL HIGH (ref 11.4–15.2)

## 2018-06-01 LAB — TROPONIN I: Troponin I: 0.58 ng/mL (ref <0.03)

## 2018-06-01 MED ORDER — EPINEPHRINE PF 1 MG/10ML IJ SOSY
PREFILLED_SYRINGE | INTRAMUSCULAR | Status: AC | PRN
  Administered 2018-06-01: 1 via INTRAVENOUS

## 2018-06-01 MED ORDER — SODIUM BICARBONATE 8.4 % IV SOLN
INTRAVENOUS | Status: AC | PRN
  Administered 2018-06-01: 100 meq via INTRAVENOUS

## 2018-06-01 MED ORDER — CALCIUM CHLORIDE 10 % IV SOLN
INTRAVENOUS | Status: AC | PRN
  Administered 2018-06-01: 1 g via INTRAVENOUS

## 2018-06-01 MED ORDER — SODIUM BICARBONATE 8.4 % IV SOLN
INTRAVENOUS | Status: AC
  Filled 2018-06-01: qty 50

## 2018-06-01 MED ORDER — EPINEPHRINE PF 1 MG/10ML IJ SOSY
PREFILLED_SYRINGE | INTRAMUSCULAR | Status: AC | PRN
  Administered 2018-06-01 (×2): 1 via INTRAVENOUS

## 2018-06-01 MED FILL — Medication: Qty: 1 | Status: AC

## 2018-06-04 ENCOUNTER — Ambulatory Visit: Payer: Medicare HMO | Admitting: Vascular Surgery

## 2018-06-10 ENCOUNTER — Ambulatory Visit: Payer: Medicare HMO | Admitting: Vascular Surgery

## 2018-06-12 NOTE — Progress Notes (Signed)
2 Family members came [employer] of patient and [place] Los either friend or family. They wanted to pull the wife out of the ED room with patient to be with family for support.  Told family would pass this to  Chaplains who are changing shifts.   2018/06/02 0800  Clinical Encounter Type  Visited With Family;Patient and family together;Other (Comment)  Visit Type Critical Care;Death;ED  Referral From Nurse  Consult/Referral To Chaplain  Spiritual Encounters  Spiritual Needs Grief support  Stress Factors  Patient Stress Factors Other (Comment) (death)  Family Stress Factors Health changes;Loss   Conard Novak, Chaplain

## 2018-06-12 NOTE — Code Documentation (Signed)
Unable to palpate pulses, but cardiac movement on [place]. HR 20's. Now pacing.

## 2018-06-12 NOTE — Consult Note (Signed)
NAME:  Adam Loeper., MRN:  [id number], DOB:  06-03-1960, LOS: 0 ADMISSION DATE:  06/28/18, CONSULTATION DATE:  06/28/2018 REFERRING MD:  Christy Gentles  CHIEF COMPLAINT:  Cardiac Arrest   Brief History   Adam Ciccarelli. is a 58 y.o. male who was brought to hospital after out of hospital cardiac arrest with roughly 1 hour 45 minutes before ROSC prior to hospital arrival plus additional 15-20 minutes after hospital arrival.  DNR status obtained by wife after extensive discussions.  History of present illness   Pt is encephelopathic; therefore, this HPI is obtained from chart review. Adam Cihlar. is a 58 y.o. male who has a PMH as outlined below (see "past medical history").  He presented to [facility] ED 1/21 after out of hospital arrest at his home.  Wife found him unresponsive and pulesless.  CPR was started by family and upon EMS arrival, he was noted to be in V.fib.  He received 14 rounds of epi and was defibrillated 18 times.  During the resuscitation attempt, he went in and out of V.fib, V.tach, PEA, sinus brady. Total time of ACLS was roughly 1 hour and 45 minutes prior to ROSC.  Upon arrival to ED, he had recurrent arrest x 2 with additional ~15 minutes of ACLS prior to ROSC.  Discussed with wife extensively in conference room as well as bedside.  We discussed Adam [name] current circumstances, organ failures, and extremely poor prognosis. We also discussed patient's prior wishes under circumstances such as this. In light of his poor prognosis, Adam [name] has decided not to perform resuscitation if arrest were to occur, but to otherwise continue with current medical support / therapies.   Past Medical History  Thyroid CA, hypothyroidism, PAH, NICM s/p AICD, Mitral stenosis, A.fib, AS, ESRD, anemia.  Significant Hospital Events   1/21 > presented to ED.  Code status changed to DNR after discussions with wife.  Consults:  None.  Procedures:  ETT 1/21 >   Significant  Diagnostic Tests:  CXR 1/21 > pulmonary edema.  Micro Data:  None.  Antimicrobials:  None.   Interim history/subjective:  Agonal on full vent support.  HR ranging from 30 - 60, SBP 70s.  Objective:  Blood pressure 110/75, pulse 65, temperature (!) 95.6 F (35.3 C), temperature source Temporal, resp. rate (!) 23, SpO2 (!) 88 %.    Vent Mode: PRVC FiO2 (%):  [100 %] 100 % Set Rate:  [18 bmp] 18 bmp Vt Set:  [580 mL-600 mL] 600 mL PEEP:  [8 cmH20] 8 cmH20  No intake or output data in the 24 hours ending 2018-06-28 0509 There were no vitals filed for this visit.  Examination: General: Adult male, critically ill. Neuro: Unresponsive, agonal.  No cough/gag/corneals. HEENT: [place]/AT. Pupils not reactive.  ETT in place. Cardiovascular: RRR, 2/6 SEM. Lungs: Agonal respirations on vent.  Coarse bilaterally. Abdomen: Umbilical hernia that is easily reducible.  BS x 4, soft, NT/ND.  Musculoskeletal: No gross deformities, venous stasis changes bilaterally. Skin: Intact, warm, no rashes.  Assessment & Plan:   Cardiac Arrest with prolonged downtime (over 2 hours in total). Metabolic acidosis. Mild hyperkalemia - s/p multiple rounds temporizing measures. ESRD. Hyperglycemia. Leukocytosis. Anemia of chronic disease.  Dr. Elwyn Reach and I have had an extensive discussions with Adam [name] wife. We discussed Adam [name] current circumstances, organ failures, and extremely poor prognosis. We also discussed patient's prior wishes under circumstances such as this. In light of his poor prognosis, Adam [name]  has decided not to perform resuscitation if arrest were to occur, but to otherwise continue with current medical support / therapies.  Admission orders will not be placed for now given that pt is expected to lose pulses again at any moment (pattern has been for him to maintain pulses for 5 - 10 minutes before losing them).  Discussed with ED RN who will notify [facility] if pt does maintain pulses and if  he will require admission.   Best Practice:  Diet: NPO. Pain/Anxiety/Delirium protocol (if indicated): N/A. VAP protocol (if indicated): N/A. DVT prophylaxis: N/A. GI prophylaxis: N/A. Glucose control: N/A. Mobility: N/A. Code Status: DNR. Family Communication: Discussed with wife, see above. Disposition: ED.  Labs   CBC: Recent Labs  Lab 05/25/18 0737 05/31/18 1333 06-03-18 0404  WBC  --   --  21.3*  NEUTROABS  --   --  15.1*  HGB 12.2* 10.9* 9.6*  HCT 36.0* 32.0* 35.1*  MCV  --   --  103.2*  PLT  --   --  884   Basic Metabolic Panel: Recent Labs  Lab 05/25/18 0737 05/31/18 1333 03-Jun-2018 0355  NA 135 134* 139  K 4.5 5.6* 5.7*  CL  --   --  104  CO2  --   --  14*  GLUCOSE 70 50* 251*  BUN  --   --  82*  CREATININE  --   --  11.55*  CALCIUM  --   --  9.0   GFR: Estimated Creatinine Clearance: 8.9 mL/min (A) (by C-G formula based on SCr of 11.55 mg/dL (H)). Recent Labs  Lab 06/03/2018 0404  WBC 21.3*   Liver Function Tests: No results for input(s): AST, ALT, ALKPHOS, BILITOT, PROT, ALBUMIN in the last 168 hours. No results for input(s): LIPASE, AMYLASE in the last 168 hours. No results for input(s): AMMONIA in the last 168 hours. ABG    Component Value Date/Time   PHART 7.054 (LL) 06-03-2018 0442   PCO2ART 39.7 2018/06/03 0442   PO2ART 73.0 (L) 06/03/2018 0442   HCO3 11.4 (L) 06-03-18 0442   TCO2 13 (L) 03-Jun-2018 0442   ACIDBASEDEF 19.0 (H) Jun 03, 2018 0442   O2SAT 89.0 06-03-2018 0442    Coagulation Profile: Recent Labs  Lab 05/31/18 1332 06/03/18 0404  INR 1.08 1.61   Cardiac Enzymes: No results for input(s): CKTOTAL, CKMB, CKMBINDEX, TROPONINI in the last 168 hours. HbA1C: Hemoglobin A1C  Date/Time Value Ref Range Status  09/16/2017 09:00 AM 4.5  Final   CBG: No results for input(s): GLUCAP in the last 168 hours.  Review of Systems:   Unable to obtain as pt is encephalopathic.  Past medical history  He,  has a past medical  history of Abscess (02/2018), AICD (automatic cardioverter/defibrillator) present, Anemia, Aortic stenosis, Arthritis, Atrial fibrillation ([place]), ESRD (end stage renal disease) on dialysis ([place]), Great toe amputation status, History of blood transfusion (2009), Hypothyroidism (acquired), Mitral stenosis, Nonhealing surgical wound, Nonischemic cardiomyopathy ([place]), Pneumonia (2014; 08/05/2016), Pulmonary HTN ([place]), Renal insufficiency, and Thyroid cancer ([place]) (2011).   Surgical History    Past Surgical History:  Procedure Laterality Date  . A/V SHUNTOGRAM N/A 02/25/2018   Procedure: A/V SHUNTOGRAM;  Surgeon: Marty Heck, MD;  Location: [place] CV [facility];  Service: Cardiovascular;  Laterality: N/A;  lt arm fistula  . ARTERIAL LINE INSERTION Right 08/24/2017   Procedure: ARTERIAL LINE INSERTION INTO RIGHT FEMORAL ARTERY;  Surgeon: Conrad Superior, MD;  Location: [place];  Service: Vascular;  Laterality:  Right;  Marland Kitchen BASCILIC VEIN TRANSPOSITION Right 10/13/2017   Procedure: FIRST STAGE BRACHIAL VEIN TRANSPOSITION RIGHT ARM;  Surgeon: Conrad Harrisburg, MD;  Location: [place];  Service: Vascular;  Laterality: Right;  . BIOPSY  09/25/2017   Procedure: BIOPSY;  Surgeon: Ronnette Juniper, MD;  Location: [facility];  Service: Gastroenterology;;  . CARDIAC CATHETERIZATION Right 2017  . CARDIAC DEFIBRILLATOR PLACEMENT  2009  . DIALYSIS FISTULA CREATION Left 2003   "upper arm"  . DIALYSIS/PERMA CATHETER REMOVAL Left 09/01/2017   Procedure: DIALYSIS/PERMA CATHETER REMOVAL;  Surgeon: Conrad Milton, MD;  Location: [place];  Service: Vascular;  Laterality: Left;  . ESOPHAGOGASTRODUODENOSCOPY (EGD) WITH PROPOFOL N/A 09/25/2017   Procedure: ESOPHAGOGASTRODUODENOSCOPY (EGD) WITH PROPOFOL;  Surgeon: Ronnette Juniper, MD;  Location: [place];  Service: Gastroenterology;  Laterality: N/A;  . HERNIA REPAIR    . I&D EXTREMITY Right 11/06/2017   Procedure: IRRIGATION AND DEBRIDEMENT KNEE;  Surgeon: Rod Can, MD;  Location:  [place];  Service: Orthopedics;  Laterality: Right;  . I&D EXTREMITY Left 01/03/2018   Procedure: IRRIGATION AND DEBRIDEMENT OF THIGH, wound vac application  wound size 10cm x 2.5cm x 2.5cm;  Surgeon: Waynetta Sandy, MD;  Location: [place];  Service: Vascular;  Laterality: Left;  . ICD LEAD REMOVAL Right 08/20/2017   Procedure: ICD EXTRACTION WITH C-ARM;  Surgeon: Evans Lance, MD;  Location: [facility] OR;  Service: Cardiovascular;  Laterality: Right;  DR VAN TRIGT TO BACK-UP  . icd removed    . INSERTION OF DIALYSIS CATHETER N/A 11/11/2016   Procedure: INSERTION OF TUNNELED DIALYSIS CATHETER;  Surgeon: Angelia Mould, MD;  Location: [place];  Service: Vascular;  Laterality: N/A;  . INSERTION OF DIALYSIS CATHETER Left 08/24/2017   Procedure: INSERTION OF DIALYSIS CATHETER;  Surgeon: Conrad Edenborn, MD;  Location: [place];  Service: Vascular;  Laterality: Left;  . INSERTION OF DIALYSIS CATHETER Left 09/01/2017   Procedure: INSERTION OF DIALYSIS CATHETER;  Surgeon: Conrad Henrieville, MD;  Location: [place];  Service: Vascular;  Laterality: Left;  . IR RADIOLOGY PERIPHERAL GUIDED IV START  05/19/2018  . IR THROMBECTOMY AV FISTULA W/THROMBOLYSIS/PTA INC/SHUNT/IMG LEFT Left 05/19/2018  . IR US GUIDE VASC ACCESS LEFT  05/19/2018  . IR US GUIDE VASC ACCESS RIGHT  05/19/2018  . KIDNEY TRANSPLANT  2009  . KNEE SURGERY Bilateral 2000s   "drained fluid"  . LAPAROSCOPIC GASTRIC BANDING  2006  . LIGATION OF ARTERIOVENOUS  FISTULA Right 11/10/2017   Procedure: LIGATION OF BRACHIAL VEIN TRANSPOSITION RIGHT ARM;  Surgeon: Angelia Mould, MD;  Location: [place];  Service: Vascular;  Laterality: Right;  . MASS EXCISION N/A 01/12/2018   Procedure: EXCISION OF MULTIPLE SUBCUTANEOUS MASSES;  Surgeon: Ralene Ok, MD;  Location: [place];  Service: General;  Laterality: N/A;  . PROSTATE BIOPSY  2009  . REVISON OF ARTERIOVENOUS FISTULA Left 5/62/1308   Procedure: PLICATION OF ARTERIOVENOUS FISTULA ANEURYSM;  Surgeon:  Elam Dutch, MD;  Location: [place];  Service: Vascular;  Laterality: Left;  . REVISON OF ARTERIOVENOUS FISTULA Left 11/10/2016   Procedure: REVISON OF ARTERIOVENOUS FISTULA - LEFT UPPER ARM;  Surgeon: Elam Dutch, MD;  Location: [place];  Service: Vascular;  Laterality: Left;  . REVISON OF ARTERIOVENOUS FISTULA Left 08/24/2017   Procedure: REVISON OF ARTERIOVENOUS FISTULA LEFT;  Surgeon: Conrad Glasco, MD;  Location: [place];  Service: Vascular;  Laterality: Left;  . TEE WITHOUT CARDIOVERSION N/A 08/20/2017   Procedure: TRANSESOPHAGEAL ECHOCARDIOGRAM (TEE);  Surgeon: Lovena Le,  Champ Mungo, MD;  Location: [place];  Service: Cardiovascular;  Laterality: N/A;  . THROMBECTOMY W/ EMBOLECTOMY Left 12/25/2014   Procedure: THROMBECTOMY ARTERIOVENOUS FISTULA;  Surgeon: Elam Dutch, MD;  Location: [place];  Service: Vascular;  Laterality: Left;  . THYROIDECTOMY  2011  . TOE AMPUTATION Bilateral     right 1st and  2nd digits; left 1st and 3rd digits"  . UMBILICAL HERNIA REPAIR  X 2  . UPPER EXTREMITY VENOGRAPHY Right 11/05/2017   Procedure: UPPER EXTREMITY VENOGRAPHY;  Surgeon: Conrad McKnightstown, MD;  Location: [place] [facility];  Service: Cardiovascular;  Laterality: Right;  Marland Kitchen VASCULAR ACCESS DEVICE INSERTION Left 11/10/2017   Procedure: INSERTION OF SUPER HERO VASCULAR ACCESS DEVICE LEFT ARM;  Surgeon: Angelia Mould, MD;  Location: [place];  Service: Vascular;  Laterality: Left;  Marland Kitchen VEIN HARVEST Left 08/24/2017   Procedure: VEIN HARVEST;  Surgeon: Conrad Marysville, MD;  Location: [place];  Service: Vascular;  Laterality: Left;  . WOUND DEBRIDEMENT Left 12/22/2017   Procedure: EXCISION OF LEFT THIGH WOUND;  Surgeon: Elam Dutch, MD;  Location: [place];  Service: Vascular;  Laterality: Left;  . WOUND DEBRIDEMENT Left 01/28/2018   Procedure: DEBRIDEMENT WOUND LEFT THIGH;  Surgeon: Marty Heck, MD;  Location: [place];  Service: Vascular;  Laterality: Left;  . WOUND DEBRIDEMENT Right 02/23/2018   Procedure:  DEBRIDEMENT AND IRRIGATION OF RIGHT FLANK WOUND;  Surgeon: Ralene Ok, MD;  Location: [place];  Service: General;  Laterality: Right;     Social History   reports that he has never smoked. He has never used smokeless tobacco. He reports current alcohol use. He reports that he does not use drugs.   Family history   His family history includes CAD (age of onset: 10) in his mother; Emphysema in his mother; Heart failure in his mother; Hypertension in his father and mother; Kidney failure in his father.   Allergies Allergies  Allergen Reactions  . Enalapril Swelling and Other (See Comments)    MOUTH SWELLING EDEMA  . Iodinated Diagnostic Agents Nausea And Vomiting     Home meds  Prior to Admission medications   Medication Sig Start Date End Date Taking? Authorizing Provider  albuterol (PROVENTIL) (2.5 MG/3ML) 0.083% nebulizer solution Take 3 mLs (2.5 mg total) by nebulization every 6 (six) hours as needed for wheezing or shortness of breath. 03/17/18   Lauraine Rinne, NP  ambrisentan (LETAIRIS) 10 MG tablet Take 1 tablet (10 mg total) by mouth daily. Patient taking differently: Take 10 mg by mouth every evening.  09/11/17   Juanito Doom, MD  amiodarone (PACERONE) 200 MG tablet Take 200 mg by mouth every evening.     [provider]  atorvastatin (LIPITOR) 40 MG tablet Take 1 tablet (40 mg total) by mouth every evening. 12/11/17   Diallo, Earna Coder, MD  cinacalcet (SENSIPAR) 90 MG tablet Take 90 mg by mouth every evening.    [provider]  ELIQUIS 2.5 MG TABS tablet  03/11/18   [provider]  levothyroxine (SYNTHROID, LEVOTHROID) 300 MCG tablet Take 1 tablet (300 mcg total) by mouth daily before breakfast. Patient taking differently: Take 300 mcg by mouth every evening.  09/26/17   Shirley, [place], DO  midodrine (PROAMATINE) 10 MG tablet TAKE 1 TABLET (10 MG TOTAL) BY MOUTH 3 (THREE) TIMES DAILY. 10/08/17   Minus Breeding, MD  multivitamin (RENA-VIT) TABS  tablet TAKE 1 TABLET BY MOUTH EVERYDAY AT BEDTIME 01/12/18  Diallo, Abdoulaye, MD  nystatin (MYCOSTATIN/NYSTOP) powder Apply topically 4 (four) times daily. Patient taking differently: Apply 1 g topically daily as needed (rash).  11/18/17   Diallo, Earna Coder, MD  pantoprazole (PROTONIX) 40 MG tablet Take 1 tablet (40 mg total) by mouth 2 (two) times daily. Patient taking differently: Take 40 mg by mouth every evening.  02/26/18 02/26/19  Guadalupe Dawn, MD  tadalafil (ADCIRCA/CIALIS) 20 MG tablet Take 20 mg by mouth every evening.  10/06/17   [provider]  traMADol (ULTRAM) 50 MG tablet Take 1 tablet (50 mg total) by mouth every 6 (six) hours as needed. 05/31/18   Dagoberto Ligas, PA-C    Critical care time: 60 minutes.    Montey Hora, [place] [facility] Pager: [phone].  If no answer, [phone] 2018-06-22, 5:09 AM

## 2018-06-12 NOTE — Telephone Encounter (Addendum)
See below note dated 05/14/18 Sanda Klein, MD  to Adam [name].     1:39 PM  Your last hemoglobin was much better at 12. I agree with restarting Eliquis 2.5 mg twice daily, but we need to frequently recheck your hemoglobin levels (these can be drawn at dialysis and I would start checking in 2 weeks, then monthly)  Dr. Loletha Grayer

## 2018-06-12 NOTE — ED Notes (Signed)
Time of death, 71. Dr Christy Gentles at bedside to confirm.  Wife remains at bedside with chaplain.

## 2018-06-12 NOTE — ED Notes (Signed)
Correction, decided NOT to pace. HR still 40's.

## 2018-06-12 NOTE — ED Triage Notes (Signed)
BIB EMS from home post cardiac arrest. Family found pt unresponsive approx 0145, pulseless an apneic, CPR was initiated by family. Shocked 18 times. Given total 14 epi, 1g Ca, 50 Bicarb, total 450 amio, 2g Mg, 2L NS. Pt had multiple rhythm changes, vfib to vtach, PEA, then SB. Total time of CPR 1hr 10min. Pt being paced upon arrival to ED. Pulses absent upon arrival to room and CPR reinitiated.   Per family last dialysis tx was Friday. Had dialysis catheter replaced yesterday (05/31/18)

## 2018-06-12 NOTE — Telephone Encounter (Signed)
[facility] ED called and wanted to know if Dr. Andy Gauss would be completing the death certificate on this patient.  You can contact [place] @ [phone] with questions. Fleeger, Salome Spotted, CMA

## 2018-06-12 NOTE — ED Provider Notes (Signed)
[facility] EMERGENCY DEPARTMENT Provider Note   CSN: [id number] Arrival date & time: 06-25-18  2423     History   Chief Complaint Chief Complaint  Patient presents with  . Cardiac Arrest   Level 5 caveat due to unresponsive HPI Adam [name]. is a 58 y.o. male.  The history is provided by the EMS personnel. The history is limited by the condition of the patient.  Cardiac Arrest  Witnessed by:  Family member Time before BLS initiated:  Immediate  Patient with history of ESRD, pulmonary hypertension, atrial fibrillation on anticoagulation, aortic stenosis presents after cardiac arrest. Patient was found unresponsive CPR was started by family.  EMS arrived, patient received multiple rounds of defibrillation.  He received up to 14 rounds of epinephrine, calcium, bicarb, amiodarone, magnesium, fluids.  Patient had multiple rhythm changes.  Patient was intubated by EMS. Last dialysis for patient is reported to be on January 17 Past Medical History:  Diagnosis Date  . Abscess 02/2018   LEFT THIGH   . AICD (automatic cardioverter/defibrillator) present    [employer] - s/p extraction 08/21/17 due to staph infection  . Anemia   . Aortic stenosis    moderate AS by 08/2016 echo  . Arthritis    "knees" (08/05/2016)  . Atrial fibrillation ([place])   . ESRD (end stage renal disease) on dialysis [facility])    ESRD due to HTN started HD 2003, had renal Tx 2009 for 18 mos. Was on NX stage hemodialysis as of 2018 but in late 2019 is back at [facility] incenter HD.   Marland Kitchen Great toe amputation status    status post left hallux amputation 08/01/14  . History of blood transfusion 2009   "S/P biopsy for prostate cancer check"  . Hypothyroidism (acquired)   . Mitral stenosis    moderate mitral stenosis  . Nonhealing surgical wound    left thigh  . Nonischemic cardiomyopathy ([place])   . Pneumonia 2014; 08/05/2016  . Pulmonary HTN ([place])   . Renal insufficiency   . Thyroid cancer [facility])  2011    Patient Active Problem List   Diagnosis Date Noted  . Goals of care, counseling/discussion   . Palliative care by specialist   . DNR (do not resuscitate) discussion   . Chronic renal failure   . Acute on chronic systolic CHF (congestive heart failure) ([place])   . Acute pulmonary edema (HCC)   . Diabetic autonomic neuropathy associated with type 2 diabetes mellitus ([place])   . Complication of vascular access for dialysis   . Open wound of left thigh 03/23/2018  . Hypokalemia   . ESRD (end stage renal disease) on dialysis ([place])   . Insomnia 02/11/2018  . Chills 02/11/2018  . Shortness of breath 02/11/2018  . Leukopenia   . Wound infection 01/02/2018  . Nodule of chest wall 11/18/2017  . Calciphylaxis   . Abscess   . Acute pain of right knee   . Malnutrition of moderate degree 11/04/2017  . Septic arthritis of knee, right ([place]) 11/02/2017  . Dialysis AV fistula infection, sequela 11/02/2017  . Mass 10/29/2017  . Near syncope 09/24/2017  . Syncope, cardiogenic   . ESRD on hemodialysis ([place])   . Anemia 09/16/2017  . Diarrhea 09/16/2017  . Tarry stools 09/16/2017  . Acute encephalopathy   . Hypotension   . Complication of arteriovenous dialysis fistula   . Stage 5 chronic kidney disease on chronic dialysis ([place])   . Infected defibrillator ([place])   .  MSSA bacteremia 08/18/2017  . Sepsis ([place]) 08/17/2017  . Lymphadenopathy, mediastinal 04/10/2017  . Hypothyroidism 10/09/2016  . Acute respiratory failure with hypoxia ([place]) 08/06/2016  . HCAP (healthcare-associated pneumonia) 08/05/2016  . Thyroid cancer ([place]) 06/12/2016  . Epistaxis 03/04/2016  . Long term current use of amiodarone 01/10/2016  . Critical lower limb ischemia 12/04/2015  . Carotid artery stenosis 05/24/2015  . Excess skin 05/24/2015  . Osteomyelitis of left foot ([place]) 04/30/2015  . Status post amputation 04/19/2015  . Wound dehiscence 04/19/2015  . Sinus congestion 03/08/2015  . Bleeding pseudoaneurysm  of left brachiocephalic arteriovenous fistula ([place]) 12/24/2014  . Pure hypercholesterolemia 11/09/2014  . Obesity 11/07/2014  . Healthcare maintenance 11/07/2014  . H/O malignant neoplasm of thyroid 11/07/2014  . Toe amputation status 10/13/2014  . ICD (implantable cardioverter-defibrillator) in place 07/25/2014  . ESRD (end stage renal disease) on hemodialysis 07/25/2014  . End-stage renal disease ([place]) 07/25/2014  . Automatic implantable cardioverter-defibrillator in situ 07/25/2014  . Pulmonary hypertension ([place]) 2020/06/2814  . Secondary pulmonary hypertension 2020/06/2814  . Atrial fibrillation [I48.91] 05/24/2014  . Long term current use of anticoagulant therapy 05/24/2014    Past Surgical History:  Procedure Laterality Date  . A/V SHUNTOGRAM N/A 02/25/2018   Procedure: A/V SHUNTOGRAM;  Surgeon: Marty Heck, MD;  Location: [facility] CV LAB;  Service: Cardiovascular;  Laterality: N/A;  lt arm fistula  . ARTERIAL LINE INSERTION Right 08/24/2017   Procedure: ARTERIAL LINE INSERTION INTO RIGHT FEMORAL ARTERY;  Surgeon: Conrad Seal Beach, MD;  Location: [facility];  Service: Vascular;  Laterality: Right;  . BASCILIC VEIN TRANSPOSITION Right 10/13/2017   Procedure: FIRST STAGE BRACHIAL VEIN TRANSPOSITION RIGHT ARM;  Surgeon: Conrad Coshocton, MD;  Location: [facility];  Service: Vascular;  Laterality: Right;  . BIOPSY  09/25/2017   Procedure: BIOPSY;  Surgeon: Ronnette Juniper, MD;  Location: [facility] ENDOSCOPY;  Service: Gastroenterology;;  . CARDIAC CATHETERIZATION Right 2017  . CARDIAC DEFIBRILLATOR PLACEMENT  2009  . DIALYSIS FISTULA CREATION Left 2003   "upper arm"  . DIALYSIS/PERMA CATHETER REMOVAL Left 09/01/2017   Procedure: DIALYSIS/PERMA CATHETER REMOVAL;  Surgeon: Conrad Rothschild, MD;  Location: [facility];  Service: Vascular;  Laterality: Left;  . ESOPHAGOGASTRODUODENOSCOPY (EGD) WITH PROPOFOL N/A 09/25/2017   Procedure: ESOPHAGOGASTRODUODENOSCOPY (EGD) WITH PROPOFOL;  Surgeon: Ronnette Juniper, MD;  Location:  [facility];  Service: Gastroenterology;  Laterality: N/A;  . HERNIA REPAIR    . I&D EXTREMITY Right 11/06/2017   Procedure: IRRIGATION AND DEBRIDEMENT KNEE;  Surgeon: Rod Can, MD;  Location: [facility];  Service: Orthopedics;  Laterality: Right;  . I&D EXTREMITY Left 01/03/2018   Procedure: IRRIGATION AND DEBRIDEMENT OF THIGH, wound vac application  wound size 10cm x 2.5cm x 2.5cm;  Surgeon: Waynetta Sandy, MD;  Location: [facility];  Service: Vascular;  Laterality: Left;  . ICD LEAD REMOVAL Right 08/20/2017   Procedure: ICD EXTRACTION WITH C-ARM;  Surgeon: Evans Lance, MD;  Location: [facility] OR;  Service: Cardiovascular;  Laterality: Right;  DR VAN TRIGT TO BACK-UP  . icd removed    . INSERTION OF DIALYSIS CATHETER N/A 11/11/2016   Procedure: INSERTION OF TUNNELED DIALYSIS CATHETER;  Surgeon: Angelia Mould, MD;  Location: [facility];  Service: Vascular;  Laterality: N/A;  . INSERTION OF DIALYSIS CATHETER Left 08/24/2017   Procedure: INSERTION OF DIALYSIS CATHETER;  Surgeon: Conrad Covington, MD;  Location: [facility];  Service: Vascular;  Laterality: Left;  . INSERTION OF DIALYSIS CATHETER Left 09/01/2017   Procedure: INSERTION  OF DIALYSIS CATHETER;  Surgeon: Conrad Cuyuna, MD;  Location: [facility];  Service: Vascular;  Laterality: Left;  . IR RADIOLOGY PERIPHERAL GUIDED IV START  05/19/2018  . IR THROMBECTOMY AV FISTULA W/THROMBOLYSIS/PTA INC/SHUNT/IMG LEFT Left 05/19/2018  . IR US GUIDE VASC ACCESS LEFT  05/19/2018  . IR US GUIDE VASC ACCESS RIGHT  05/19/2018  . KIDNEY TRANSPLANT  2009  . KNEE SURGERY Bilateral 2000s   "drained fluid"  . LAPAROSCOPIC GASTRIC BANDING  2006  . LIGATION OF ARTERIOVENOUS  FISTULA Right 11/10/2017   Procedure: LIGATION OF BRACHIAL VEIN TRANSPOSITION RIGHT ARM;  Surgeon: Angelia Mould, MD;  Location: [facility];  Service: Vascular;  Laterality: Right;  . MASS EXCISION N/A 01/12/2018   Procedure: EXCISION OF MULTIPLE SUBCUTANEOUS MASSES;  Surgeon: Ralene Ok, MD;   Location: [facility];  Service: General;  Laterality: N/A;  . PROSTATE BIOPSY  2009  . REVISON OF ARTERIOVENOUS FISTULA Left 05/30/4172   Procedure: PLICATION OF ARTERIOVENOUS FISTULA ANEURYSM;  Surgeon: Elam Dutch, MD;  Location: [facility];  Service: Vascular;  Laterality: Left;  . REVISON OF ARTERIOVENOUS FISTULA Left 11/10/2016   Procedure: REVISON OF ARTERIOVENOUS FISTULA - LEFT UPPER ARM;  Surgeon: Elam Dutch, MD;  Location: [facility];  Service: Vascular;  Laterality: Left;  . REVISON OF ARTERIOVENOUS FISTULA Left 08/24/2017   Procedure: REVISON OF ARTERIOVENOUS FISTULA LEFT;  Surgeon: Conrad Mondamin, MD;  Location: [facility];  Service: Vascular;  Laterality: Left;  . TEE WITHOUT CARDIOVERSION N/A 08/20/2017   Procedure: TRANSESOPHAGEAL ECHOCARDIOGRAM (TEE);  Surgeon: Evans Lance, MD;  Location: [facility];  Service: Cardiovascular;  Laterality: N/A;  . THROMBECTOMY W/ EMBOLECTOMY Left 12/25/2014   Procedure: THROMBECTOMY ARTERIOVENOUS FISTULA;  Surgeon: Elam Dutch, MD;  Location: [facility];  Service: Vascular;  Laterality: Left;  . THYROIDECTOMY  2011  . TOE AMPUTATION Bilateral     right 1st and  2nd digits; left 1st and 3rd digits"  . UMBILICAL HERNIA REPAIR  X 2  . UPPER EXTREMITY VENOGRAPHY Right 11/05/2017   Procedure: UPPER EXTREMITY VENOGRAPHY;  Surgeon: Conrad Wharton, MD;  Location: [facility] CV LAB;  Service: Cardiovascular;  Laterality: Right;  Marland Kitchen VASCULAR ACCESS DEVICE INSERTION Left 11/10/2017   Procedure: INSERTION OF SUPER HERO VASCULAR ACCESS DEVICE LEFT ARM;  Surgeon: Angelia Mould, MD;  Location: [facility] [place];  Service: Vascular;  Laterality: Left;  Marland Kitchen VEIN HARVEST Left 08/24/2017   Procedure: VEIN HARVEST;  Surgeon: Conrad Klagetoh, MD;  Location: [facility];  Service: Vascular;  Laterality: Left;  . WOUND DEBRIDEMENT Left 12/22/2017   Procedure: EXCISION OF LEFT THIGH WOUND;  Surgeon: Elam Dutch, MD;  Location: [facility];  Service: Vascular;  Laterality: Left;  . WOUND DEBRIDEMENT  Left 01/28/2018   Procedure: DEBRIDEMENT WOUND LEFT THIGH;  Surgeon: Marty Heck, MD;  Location: [facility];  Service: Vascular;  Laterality: Left;  . WOUND DEBRIDEMENT Right 02/23/2018   Procedure: DEBRIDEMENT AND IRRIGATION OF RIGHT FLANK WOUND;  Surgeon: Ralene Ok, MD;  Location: [facility];  Service: General;  Laterality: Right;        Home Medications    Prior to Admission medications   Medication Sig Start Date End Date Taking? Authorizing Provider  albuterol (PROVENTIL) (2.5 MG/3ML) 0.083% nebulizer solution Take 3 mLs (2.5 mg total) by nebulization every 6 (six) hours as needed for wheezing or shortness of breath. 03/17/18   Lauraine Rinne, NP  ambrisentan (LETAIRIS) 10 MG tablet Take 1 tablet (  10 mg total) by mouth daily. Patient taking differently: Take 10 mg by mouth every evening.  09/11/17   Juanito Doom, MD  amiodarone (PACERONE) 200 MG tablet Take 200 mg by mouth every evening.     [provider]  atorvastatin (LIPITOR) 40 MG tablet Take 1 tablet (40 mg total) by mouth every evening. 12/11/17   Diallo, Earna Coder, MD  cinacalcet (SENSIPAR) 90 MG tablet Take 90 mg by mouth every evening.    [provider]  ELIQUIS 2.5 MG TABS tablet  03/11/18   [provider]  levothyroxine (SYNTHROID, LEVOTHROID) 300 MCG tablet Take 1 tablet (300 mcg total) by mouth daily before breakfast. Patient taking differently: Take 300 mcg by mouth every evening.  09/26/17   Shirley, [place], DO  midodrine (PROAMATINE) 10 MG tablet TAKE 1 TABLET (10 MG TOTAL) BY MOUTH 3 (THREE) TIMES DAILY. 10/08/17   Minus Breeding, MD  multivitamin (RENA-VIT) TABS tablet TAKE 1 TABLET BY MOUTH EVERYDAY AT BEDTIME 01/12/18   Diallo, Abdoulaye, MD  nystatin (MYCOSTATIN/NYSTOP) powder Apply topically 4 (four) times daily. Patient taking differently: Apply 1 g topically daily as needed (rash).  11/18/17   Diallo, Earna Coder, MD  pantoprazole (PROTONIX) 40 MG tablet Take 1 tablet (40 mg total)  by mouth 2 (two) times daily. Patient taking differently: Take 40 mg by mouth every evening.  02/26/18 02/26/19  Guadalupe Dawn, MD  tadalafil (ADCIRCA/CIALIS) 20 MG tablet Take 20 mg by mouth every evening.  10/06/17   [provider]  traMADol (ULTRAM) 50 MG tablet Take 1 tablet (50 mg total) by mouth every 6 (six) hours as needed. 05/31/18   Dagoberto Ligas, PA-C    Family History Family History  Problem Relation Age of Onset  . Heart failure Mother   . Hypertension Mother   . CAD Mother 10  . Emphysema Mother   . Hypertension Father   . Kidney failure Father     Social History Social History   Tobacco Use  . Smoking status: Never Smoker  . Smokeless tobacco: Never Used  Substance Use Topics  . Alcohol use: Yes    Alcohol/week: 0.0 standard drinks    Comment: social  . Drug use: No     Allergies   Enalapril and Iodinated diagnostic agents   Review of Systems Review of Systems  Unable to perform ROS: Patient unresponsive     Physical Exam Updated Vital Signs BP (!) 65/43   Pulse (!) 42   Temp (!) 95.6 F (35.3 C) (Temporal)   Resp 13   SpO2 (!) 81%   Physical Exam  CONSTITUTIONAL: Ill-appearing, unresponsive HEAD: Normocephalic/atraumatic, no signs of trauma EYES: Pupils are fixed and dilated ENMT: ET tube in mouth NECK: supple no meningeal signs SPINE/BACK: No bruising/crepitance/stepoffs noted to spine CV: S1/S2 noted LUNGS: Coarse breath sounds with bagging ABDOMEN: soft/flat NEURO: Pt is unresponsive EXTREMITIES: IO in left tibia, IO in right humerus, surgical drain noted in left upper extremity SKIN: warm, color normal PSYCH: Unable to assess  ED Treatments / Results  Labs (all labs ordered are listed, but only abnormal results are displayed) Labs Reviewed  BASIC METABOLIC PANEL - Abnormal; Notable for the following components:      Result Value   Potassium 5.7 (*)    CO2 14 (*)    Glucose, Bld 251 (*)    BUN 82 (*)     Creatinine, Ser 11.55 (*)    GFR calc non Af Amer 4 (*)  GFR calc Af Amer 5 (*)    Anion gap 21 (*)    All other components within normal limits  CBC WITH DIFFERENTIAL/PLATELET - Abnormal; Notable for the following components:   WBC 21.3 (*)    RBC 3.40 (*)    Hemoglobin 9.6 (*)    HCT 35.1 (*)    MCV 103.2 (*)    MCHC 27.4 (*)    RDW 16.3 (*)    Neutro Abs 15.1 (*)    Lymphs Abs 4.5 (*)    Abs Immature Granulocytes 1.19 (*)    All other components within normal limits  PROTIME-INR - Abnormal; Notable for the following components:   Prothrombin Time 19.0 (*)    All other components within normal limits  APTT - Abnormal; Notable for the following components:   aPTT 71 (*)    All other components within normal limits  I-STAT ARTERIAL BLOOD GAS, ED - Abnormal; Notable for the following components:   pH, Arterial 7.054 (*)    pO2, Arterial 73.0 (*)    Bicarbonate 11.4 (*)    TCO2 13 (*)    Acid-base deficit 19.0 (*)    All other components within normal limits  TROPONIN I    EKG EKG Interpretation  Date/Time:  Jun 10, 2018 03:52:10 EST Ventricular Rate:  46 PR Interval:    QRS Duration: 221 QT Interval:  537 QTC Calculation: 470 R Axis:   120 Text Interpretation:  Junctional rhythm RBBB and LPFB Abnormal ekg Confirmed by Ripley Fraise [phone]) on 06/10/2018 4:00:16 AM   Radiology Dg Chest Port 1 View  Result Date: 06-10-2018 CLINICAL DATA:  Intubation after cardiac arrest EXAM: PORTABLE CHEST 1 VIEW COMPARISON:  Yesterday FINDINGS: Endotracheal tube tip between the clavicular heads and carina. The orogastric tube tip reaches the stomach. The side port is approximately 6 cm above the diaphragm. Dialysis catheter on the left with tip at the upper cavoatrial junction. Cardiomegaly and diffuse hazy airspace and interstitial opacity. No evident rib fracture or air leak. Venous stenting on the left. IMPRESSION: 1. Endotracheal tube in good position. 2. Orogastric  tube side port is 6 cm above the diaphragm, consider advancement. 3. Cardiomegaly.  Diffuse lung opacity favoring edema. Electronically Signed   By: Monte Fantasia M.D.   On: Jun 10, 2018 04:19   Dg Chest Port 1 View  Result Date: 05/31/2018 CLINICAL DATA:  Dialysis catheter in place. EXAM: PORTABLE CHEST 1 VIEW COMPARISON:  03/25/2018. FINDINGS: Interval new left jugular dialysis catheter with its tip at the superior cavoatrial junction. No pneumothorax. Stable left axillary stents. Stable prominence of the interstitial markings with normal vascularity. No pleural fluid. Mild left shoulder degenerative changes. IMPRESSION: 1. Left jugular dialysis catheter tip at the superior cavoatrial junction without pneumothorax. 2. Stable chronic interstitial lung disease. Electronically Signed   By: Claudie Revering M.D.   On: 05/31/2018 17:41   Dg Fluoro Guide Cv Line-no Report  Result Date: 05/31/2018 Fluoroscopy was utilized by the requesting physician.  No radiographic interpretation.    Procedures Glidescope laryngoscopy Date/Time: Jun 10, 2018 4:00 AM Performed by: Ripley Fraise, MD Authorized by: Ripley Fraise, MD  Consent: The procedure was performed in an emergent situation. Local anesthesia used: no  Anesthesia: Local anesthesia used: no  Sedation: Patient sedated: no  Patient tolerance: Patient tolerated the procedure well with no immediate complications Comments: I have visually confirmed ET tube placement passing through the vocal cords with glide scope.  ET tube has been placed by EMS prior to  arrival      CPR Procedure Note I PERSONALLY DIRECTED ANCILLARY STAFF OR/PERFORMED CPR IN AN EFFORT TO REGAIN RETURN OF SPONTANEOUS CIRCULATION IN AN EFFORT TO MAINTAIN NEURO, CARDIAC AND SYSTEMIC PERFUSION  CRITICAL CARE Performed by: Sharyon Cable Total critical care time: 45 minutes Critical care time was exclusive of separately billable procedures and treating other  patients. Critical care was necessary to treat or prevent imminent or life-threatening deterioration. Critical care was time spent personally by me on the following activities: development of treatment plan with patient and/or surrogate as well as nursing, discussions with consultants, evaluation of patient's response to treatment, examination of patient, obtaining history from patient or surrogate, ordering and performing treatments and interventions, ordering and review of laboratory studies, ordering and review of radiographic studies, pulse oximetry and re-evaluation of patient's condition. patient checked on multiple times, continued IV fluids and epinephrine drip, discussed case with consultants.  Medications Ordered in ED Medications  EPINEPHrine (ADRENALIN) 1 MG/10ML injection (1 Syringe Intravenous Given June 14, 2018 0341)  calcium chloride injection (1 g Intravenous Given 06/14/2018 0346)  sodium bicarbonate injection (100 mEq Intravenous Given 06-14-18 0346)  EPINEPHrine (ADRENALIN) 1 MG/10ML injection (1 Syringe Intravenous Given Jun 14, 2018 0450)  sodium bicarbonate injection ( Intravenous Canceled Entry 2018/06/14 0500)     Initial Impression / Assessment and Plan / ED Course  I have reviewed the triage vital signs and the nursing notes.  Pertinent labs & imaging results that were available during my care of the patient were reviewed by me and considered in my medical decision making (see chart for details).     4:14 AM Patient seen on arrival s/p cardiac arrest Reportedly he missed most recent dialysis session. On January 20 patient had exchange of left IJ graft for a 28 cm tunneled dialysis catheter, also had evacuation of left arm hematoma with drain in place. He had witnessed arrest at home.  CPR was started and he had prolonged resuscitation by EMS prior to arrival to hospital.  On arrival CPR was continued and he regained pulses. Patient is currently bradycardic hypotensive with IV  fluids and IV epinephrine has been fused. Presumed hyperkalemic arrest and patient has been getting calcium and bicarb Discussed the case with critical care who will evaluate the patient.  Patient is not a candidate for hypothermia at this time. Patient is in critical condition 5:06 AM Patient seen by critical care team.  He did have another episode of asystole that required CPR and epinephrine.  He is currently regained pulses, but his condition is critical.  Critical care has discussed the case extensively with the wife.  At this point if he goes into cardiac arrest again he would no longer be coded.  Wife has made the decision to de-escalate care 5:46 AM Patient has been extubated.  Time of death 5:38 AM Discussed the case with medical examiner.  Patient's primary care discharge practice, will consult them.  Final Clinical Impressions(s) / ED Diagnoses   Final diagnoses:  Cardiac arrest [facility])  Metabolic acidosis  Hyperkalemia  Respiratory arrest [facility])    ED Discharge Orders    None       Ripley Fraise, MD 14-Jun-2018 [phone]

## 2018-06-12 NOTE — ED Notes (Signed)
Pt lost pulses again. CPR initiated

## 2018-06-12 NOTE — ED Notes (Signed)
Per request of wife, pt to be extubated. Dr Christy Gentles at bedside to speak with wife and confirm. Wife understands that extubating means withdrawing care. Chaplain and wife remain at bedside with pt.

## 2018-06-12 NOTE — Progress Notes (Signed)
Pt. Extubated per MD order and per family request.

## 2018-06-12 NOTE — Progress Notes (Signed)
Patient was not doing well. Staff worked very hard. Wife was not ready to stop the chest compressions and finally was able to see he was suffering with the continued efforts and chose to stop. She wanted to see him more in a natural state without the tubes breathing for him.  It was hard to see and Chaplain remained with her for long time but gave her space once patient was declared deceased. Wife here without other family members at this time. Hopefully they will come this morning to be with her. Chaplain offered prayer and support. Conard Novak, Chaplain   06-02-18 0500  Clinical Encounter Type  Visited With Family;Patient and family together;Health care provider  Visit Type Initial;Spiritual support;Critical Care;Death;ED  Referral From Nurse  Consult/Referral To Chaplain  Spiritual Encounters  Spiritual Needs Prayer;Emotional;Grief support  Stress Factors  Patient Stress Factors Exhausted;Health changes;Other (Comment) (very ill)  Family Stress Factors Health changes;Loss;Loss of control;Other (Comment)

## 2018-06-12 NOTE — Telephone Encounter (Signed)
Talked to [place] about the death certificate, they will eventually send it to [place].  Thanks  [name], MD [place], PGY-3

## 2018-06-12 NOTE — Telephone Encounter (Signed)
-----   Message from Sanda Klein, MD sent at 04/13/2018  3:54 PM EST ----- Please see the order I added. I guess it can be drawn at HD. I also called Mr . Shell and told him to [place] altogether. Please add that to his discharge instructions. Thanks, [employer]

## 2018-06-12 DEATH — deceased

## 2018-06-14 ENCOUNTER — Other Ambulatory Visit: Payer: Self-pay | Admitting: Family Medicine

## 2018-06-22 ENCOUNTER — Ambulatory Visit: Payer: Medicare HMO | Admitting: Podiatry

## 2018-07-30 ENCOUNTER — Ambulatory Visit: Payer: Medicare HMO | Admitting: Pulmonary Disease

## 2018-08-17 ENCOUNTER — Other Ambulatory Visit (HOSPITAL_COMMUNITY): Payer: Medicare HMO

## 2019-08-17 IMAGING — CR DG HUMERUS 2V *L*
2 series · 2 of 2 positions shown · non-contrast
Comparison: None.

CLINICAL DATA: Left upper arm pain with small bulge along the
distal humerus.

EXAM:
LEFT HUMERUS - 2+ VIEW

[humerus lat]
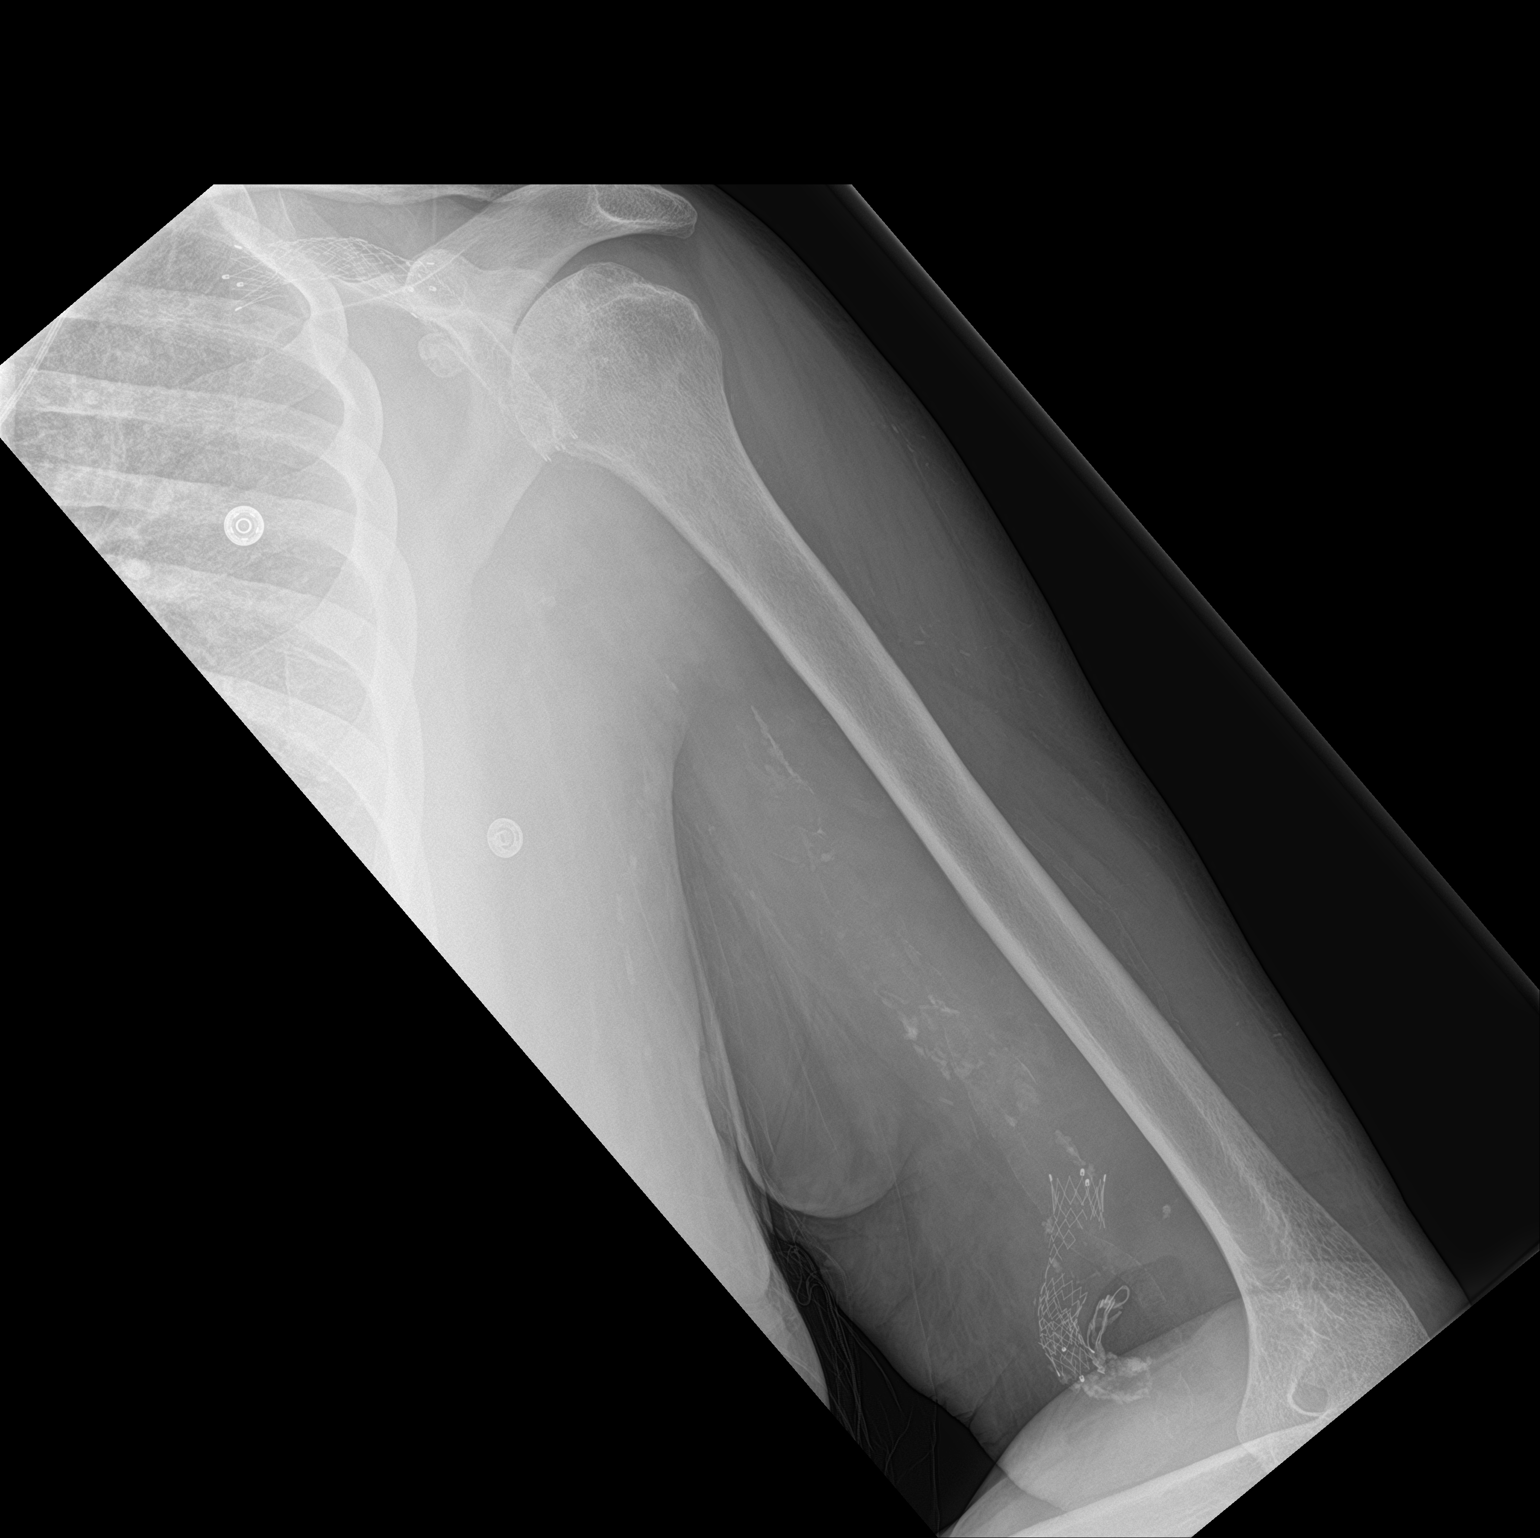

[humerus ap]
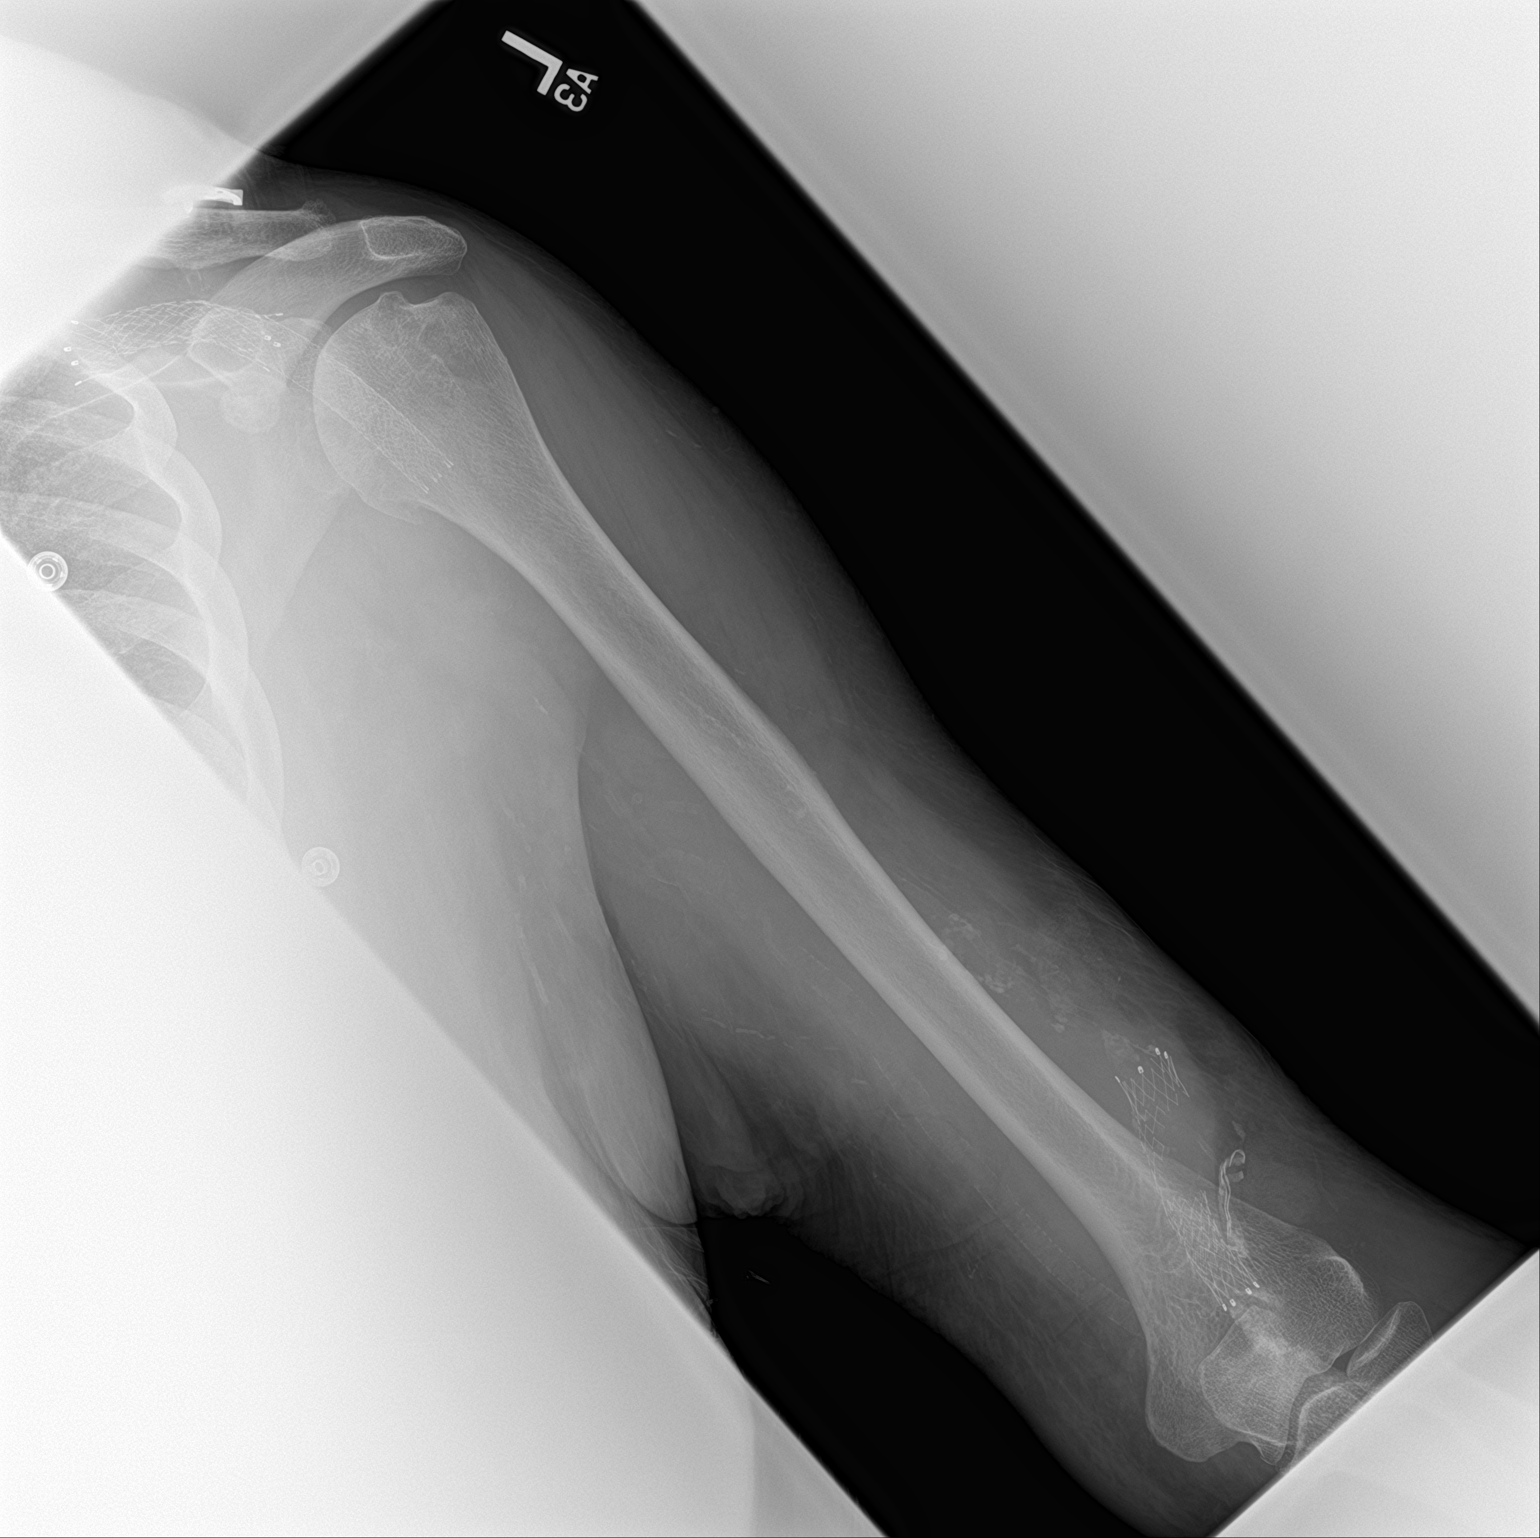

[2 of 2 positions shown; findings below may reference images not displayed]

FINDINGS: A vascular stent is seen along the volar aspect of the distal
forearm which appears discontinuous for a length of 17 mm
posteriorly and 21 mm along its radial aspect. A portion of the
stent appears to have peeled away resulting in the gap. Volar bowing
of the vascular stent is noted on the lateral view which may be
contributing to the patient's small bulge noted. Subclavian and
axillary stent is also seen projecting over the shoulder.
Atherosclerotic calcifications are identified along the brachial
artery. No acute fracture or bone destruction of the humerus. No
joint dislocation. Osteoarthritis of the glenohumeral joint with
inferomedial spurring off the humeral head.
IMPRESSION: 1. Discontinuous vascular stent along the distal arm with volar
bowing as above described. There is a small gap associated with the
stent measuring 17 mm posteriorly and 21 mm along its radial aspect
with peeling away of a portion of the stent believed to account for
this gap.
2. Intact subclavian and axillary stent projecting over the
shoulder.
3. No acute osseous abnormality.

## 2019-08-31 IMAGING — DX DG CHEST 1V PORT
1 series · 1 of 1 positions shown · non-contrast
Comparison: 08/25/2017

CLINICAL DATA: Dialysis catheter insertion

EXAM:
PORTABLE CHEST 1 VIEW

[chest]
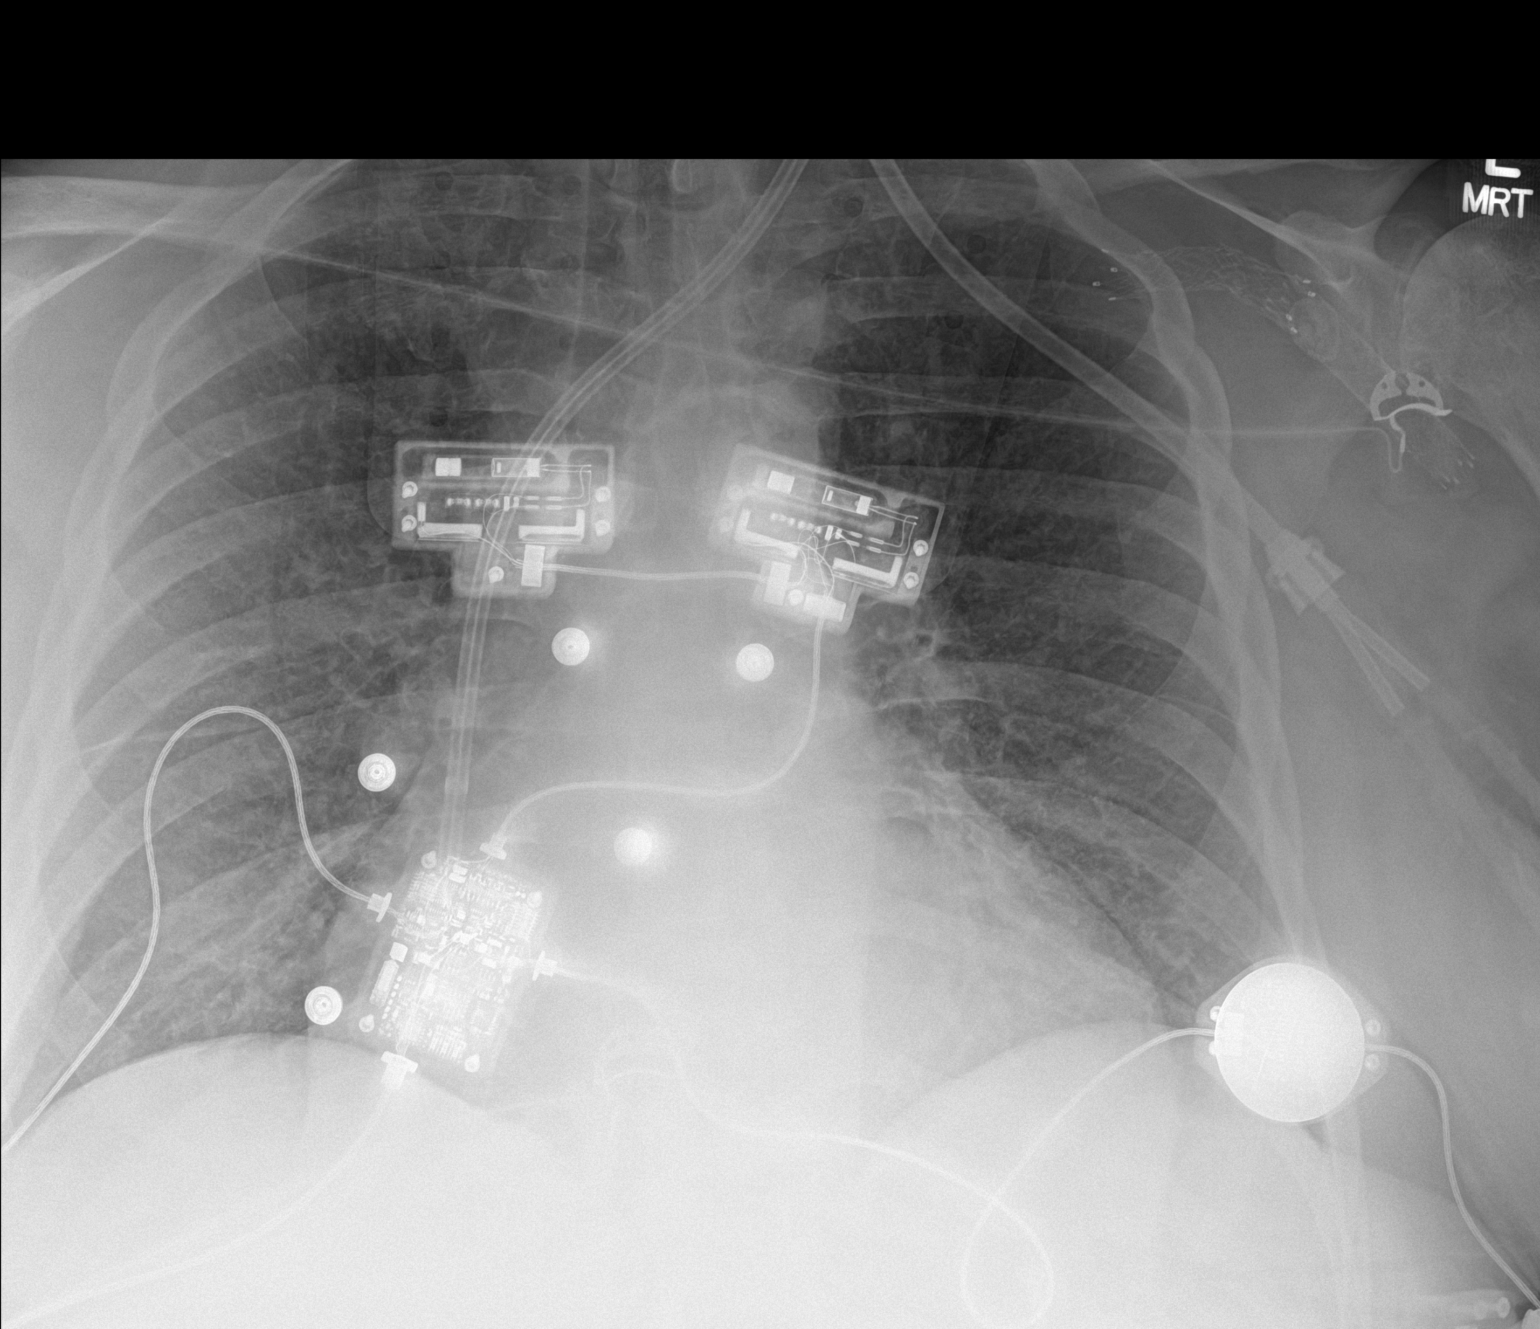

[1 of 1 positions shown; findings below may reference images not displayed]

FINDINGS: External defibrillator. There is Ming Gavarrete catheter on the left with
tip at the right atrium, partially obscured by the defibrillator
hardware. Negative for pneumothorax.

Cardiomegaly and chronic interstitial coarsening. Left subclavian
stenting.
IMPRESSION: Hoih catheter with tip at the right atrium.  No pneumothorax.

## 2019-09-23 IMAGING — CR DG CHEST 2V
2 series · 2 of 2 positions shown · non-contrast
Comparison: Chest radiograph performed 09/01/2017

CLINICAL DATA: Acute onset of syncope.

EXAM:
CHEST - 2 VIEW

[chest lat]
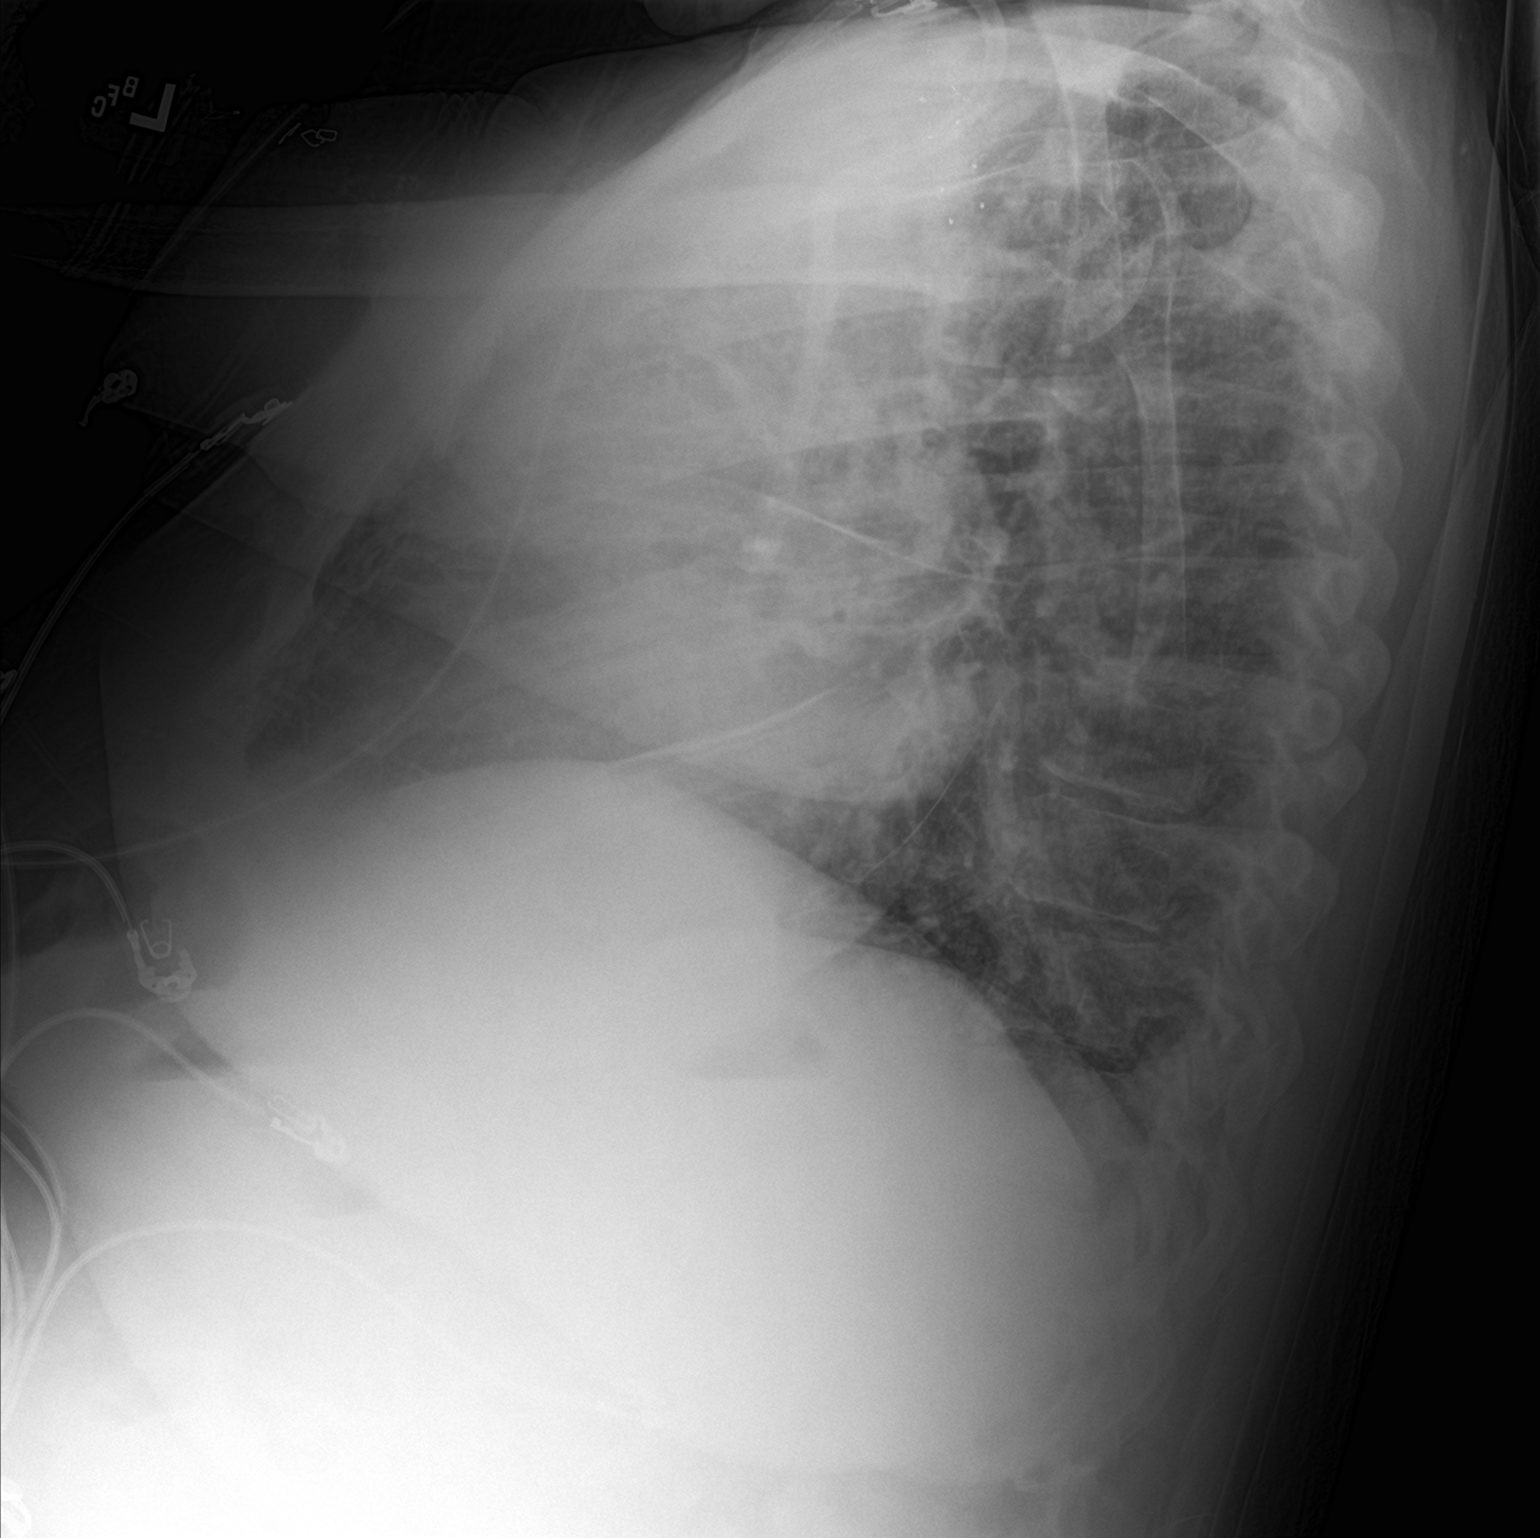

[chest ap]
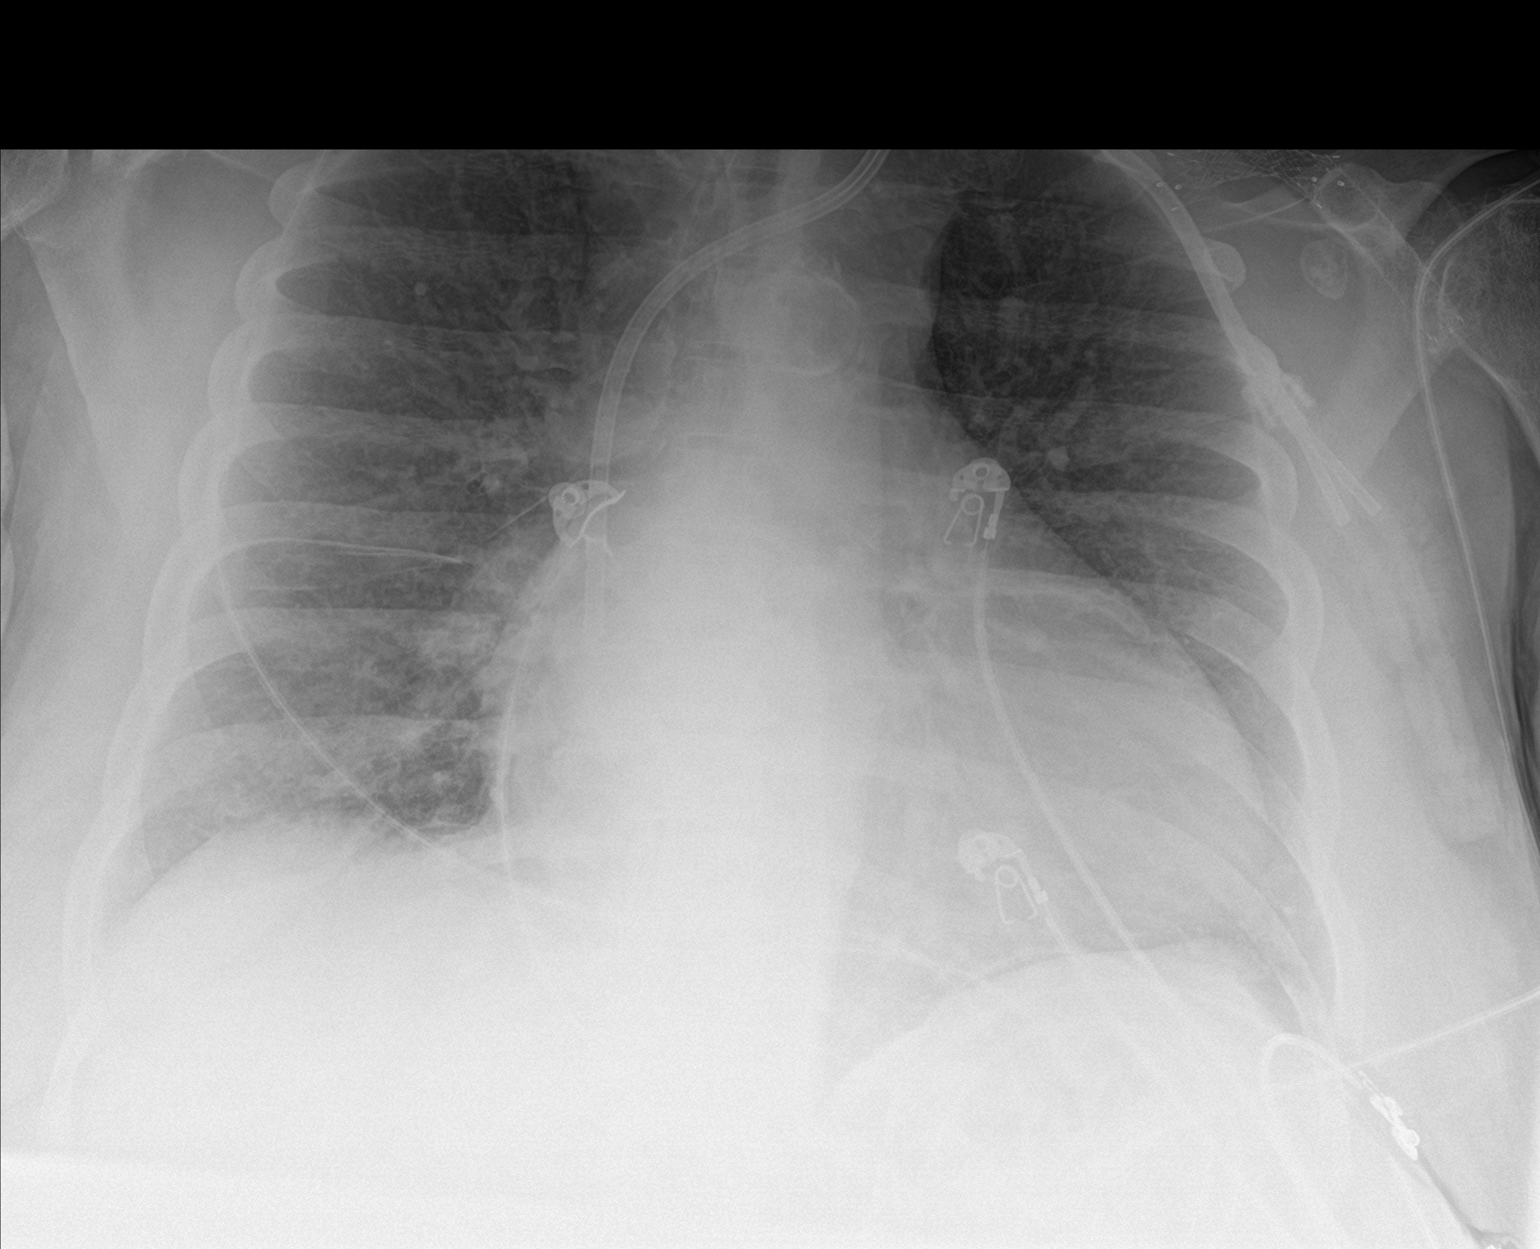

[2 of 2 positions shown; findings below may reference images not displayed]

FINDINGS: The lungs are well-aerated. Vascular congestion is noted. There is
no evidence of focal opacification, pleural effusion or
pneumothorax.

The heart is mildly enlarged. No acute osseous abnormalities are
seen. A left-sided dual-lumen catheter is noted ending about the
cavoatrial junction.
IMPRESSION: Vascular congestion and mild cardiomegaly; lungs remain grossly
clear.

## 2021-07-05 NOTE — Progress Notes (Signed)
X °
